# Patient Record
Sex: Female | Born: 1945 | Hispanic: No | Smoking: Former smoker
Health system: Southern US, Community
[De-identification: ages and names within clinical notes are randomized; demographics above are authoritative.]

## PROBLEM LIST (undated history)

## (undated) DIAGNOSIS — I10 Essential (primary) hypertension: Secondary | ICD-10-CM

## (undated) DIAGNOSIS — I2510 Atherosclerotic heart disease of native coronary artery without angina pectoris: Secondary | ICD-10-CM

## (undated) DIAGNOSIS — I509 Heart failure, unspecified: Secondary | ICD-10-CM

## (undated) DIAGNOSIS — I5022 Chronic systolic (congestive) heart failure: Secondary | ICD-10-CM

## (undated) DIAGNOSIS — G4730 Sleep apnea, unspecified: Secondary | ICD-10-CM

## (undated) DIAGNOSIS — I639 Cerebral infarction, unspecified: Secondary | ICD-10-CM

## (undated) DIAGNOSIS — Z95810 Presence of automatic (implantable) cardiac defibrillator: Secondary | ICD-10-CM

## (undated) DIAGNOSIS — N189 Chronic kidney disease, unspecified: Secondary | ICD-10-CM

## (undated) DIAGNOSIS — T7840XA Allergy, unspecified, initial encounter: Secondary | ICD-10-CM

## (undated) DIAGNOSIS — I472 Ventricular tachycardia, unspecified: Secondary | ICD-10-CM

## (undated) DIAGNOSIS — I255 Ischemic cardiomyopathy: Secondary | ICD-10-CM

## (undated) DIAGNOSIS — E785 Hyperlipidemia, unspecified: Secondary | ICD-10-CM

## (undated) DIAGNOSIS — Z8619 Personal history of other infectious and parasitic diseases: Secondary | ICD-10-CM

## (undated) DIAGNOSIS — I4891 Unspecified atrial fibrillation: Secondary | ICD-10-CM

## (undated) DIAGNOSIS — M1990 Unspecified osteoarthritis, unspecified site: Secondary | ICD-10-CM

## (undated) DIAGNOSIS — M47816 Spondylosis without myelopathy or radiculopathy, lumbar region: Secondary | ICD-10-CM

## (undated) HISTORY — DX: Chronic kidney disease, unspecified: N18.9

## (undated) HISTORY — DX: Unspecified osteoarthritis, unspecified site: M19.90

## (undated) HISTORY — DX: Hyperlipidemia, unspecified: E78.5

## (undated) HISTORY — DX: Allergy, unspecified, initial encounter: T78.40XA

## (undated) HISTORY — DX: Personal history of other infectious and parasitic diseases: Z86.19

## (undated) HISTORY — DX: Heart failure, unspecified: I50.9

## (undated) HISTORY — DX: Spondylosis without myelopathy or radiculopathy, lumbar region: M47.816

## (undated) HISTORY — PX: CARDIAC DEFIBRILLATOR PLACEMENT: SHX171

---

## 1978-03-23 HISTORY — PX: OTHER SURGICAL HISTORY: SHX169

## 1983-03-24 HISTORY — PX: CHOLECYSTECTOMY: SHX55

## 1993-03-23 HISTORY — PX: ABDOMINAL HYSTERECTOMY: SHX81

## 1997-07-20 ENCOUNTER — Inpatient Hospital Stay (HOSPITAL_COMMUNITY): Admission: EM | Admit: 1997-07-20 | Discharge: 1997-07-21 | Payer: Self-pay | Admitting: Emergency Medicine

## 1997-10-04 ENCOUNTER — Observation Stay (HOSPITAL_COMMUNITY): Admission: EM | Admit: 1997-10-04 | Discharge: 1997-10-05 | Payer: Self-pay | Admitting: Emergency Medicine

## 1998-03-28 ENCOUNTER — Inpatient Hospital Stay (HOSPITAL_COMMUNITY): Admission: EM | Admit: 1998-03-28 | Discharge: 1998-03-30 | Payer: Self-pay | Admitting: Emergency Medicine

## 1998-03-29 ENCOUNTER — Encounter: Payer: Self-pay | Admitting: Interventional Cardiology

## 1998-06-25 ENCOUNTER — Other Ambulatory Visit: Admission: RE | Admit: 1998-06-25 | Discharge: 1998-06-25 | Payer: Self-pay | Admitting: *Deleted

## 1999-04-07 ENCOUNTER — Encounter: Payer: Self-pay | Admitting: Interventional Cardiology

## 1999-04-07 ENCOUNTER — Inpatient Hospital Stay (HOSPITAL_COMMUNITY): Admission: EM | Admit: 1999-04-07 | Discharge: 1999-04-09 | Payer: Self-pay | Admitting: Emergency Medicine

## 1999-08-11 ENCOUNTER — Emergency Department (HOSPITAL_COMMUNITY): Admission: EM | Admit: 1999-08-11 | Discharge: 1999-08-11 | Payer: Self-pay | Admitting: *Deleted

## 1999-12-03 ENCOUNTER — Emergency Department (HOSPITAL_COMMUNITY): Admission: EM | Admit: 1999-12-03 | Discharge: 1999-12-03 | Payer: Self-pay | Admitting: *Deleted

## 2000-03-05 ENCOUNTER — Emergency Department (HOSPITAL_COMMUNITY): Admission: EM | Admit: 2000-03-05 | Discharge: 2000-03-05 | Payer: Self-pay

## 2000-12-29 ENCOUNTER — Inpatient Hospital Stay (HOSPITAL_COMMUNITY): Admission: EM | Admit: 2000-12-29 | Discharge: 2000-12-31 | Payer: Self-pay | Admitting: Emergency Medicine

## 2000-12-29 ENCOUNTER — Encounter: Payer: Self-pay | Admitting: Emergency Medicine

## 2000-12-31 ENCOUNTER — Encounter: Payer: Self-pay | Admitting: Cardiology

## 2001-03-17 ENCOUNTER — Emergency Department (HOSPITAL_COMMUNITY): Admission: EM | Admit: 2001-03-17 | Discharge: 2001-03-17 | Payer: Self-pay | Admitting: Emergency Medicine

## 2001-03-17 ENCOUNTER — Encounter: Payer: Self-pay | Admitting: Emergency Medicine

## 2001-11-02 ENCOUNTER — Emergency Department (HOSPITAL_COMMUNITY): Admission: EM | Admit: 2001-11-02 | Discharge: 2001-11-02 | Payer: Self-pay | Admitting: Emergency Medicine

## 2002-01-10 ENCOUNTER — Inpatient Hospital Stay (HOSPITAL_COMMUNITY): Admission: EM | Admit: 2002-01-10 | Discharge: 2002-01-14 | Payer: Self-pay | Admitting: Emergency Medicine

## 2002-01-11 ENCOUNTER — Encounter: Payer: Self-pay | Admitting: Interventional Cardiology

## 2002-01-11 ENCOUNTER — Encounter (INDEPENDENT_AMBULATORY_CARE_PROVIDER_SITE_OTHER): Payer: Self-pay | Admitting: Interventional Cardiology

## 2002-01-12 ENCOUNTER — Encounter: Payer: Self-pay | Admitting: Internal Medicine

## 2002-01-12 ENCOUNTER — Encounter: Payer: Self-pay | Admitting: Interventional Cardiology

## 2002-05-01 ENCOUNTER — Encounter: Admission: RE | Admit: 2002-05-01 | Discharge: 2002-05-01 | Payer: Self-pay | Admitting: Internal Medicine

## 2002-05-01 ENCOUNTER — Encounter: Payer: Self-pay | Admitting: Internal Medicine

## 2002-05-02 ENCOUNTER — Encounter: Payer: Self-pay | Admitting: Internal Medicine

## 2002-05-02 ENCOUNTER — Encounter: Admission: RE | Admit: 2002-05-02 | Discharge: 2002-05-02 | Payer: Self-pay | Admitting: Internal Medicine

## 2002-05-16 ENCOUNTER — Ambulatory Visit (HOSPITAL_COMMUNITY): Admission: RE | Admit: 2002-05-16 | Discharge: 2002-05-16 | Payer: Self-pay | Admitting: Internal Medicine

## 2002-05-16 ENCOUNTER — Encounter: Payer: Self-pay | Admitting: Internal Medicine

## 2003-12-09 ENCOUNTER — Emergency Department (HOSPITAL_COMMUNITY): Admission: EM | Admit: 2003-12-09 | Discharge: 2003-12-09 | Payer: Self-pay | Admitting: Emergency Medicine

## 2004-04-02 ENCOUNTER — Ambulatory Visit: Payer: Self-pay | Admitting: Cardiology

## 2004-04-03 ENCOUNTER — Inpatient Hospital Stay (HOSPITAL_COMMUNITY): Admission: EM | Admit: 2004-04-03 | Discharge: 2004-04-09 | Payer: Self-pay | Admitting: Emergency Medicine

## 2004-04-04 ENCOUNTER — Encounter: Payer: Self-pay | Admitting: Cardiology

## 2005-04-01 ENCOUNTER — Inpatient Hospital Stay (HOSPITAL_COMMUNITY): Admission: EM | Admit: 2005-04-01 | Discharge: 2005-04-10 | Payer: Self-pay | Admitting: Emergency Medicine

## 2005-04-02 ENCOUNTER — Encounter (INDEPENDENT_AMBULATORY_CARE_PROVIDER_SITE_OTHER): Payer: Self-pay | Admitting: Cardiology

## 2005-04-18 ENCOUNTER — Inpatient Hospital Stay (HOSPITAL_COMMUNITY): Admission: EM | Admit: 2005-04-18 | Discharge: 2005-04-22 | Payer: Self-pay | Admitting: Emergency Medicine

## 2005-05-29 ENCOUNTER — Inpatient Hospital Stay (HOSPITAL_COMMUNITY): Admission: EM | Admit: 2005-05-29 | Discharge: 2005-06-04 | Payer: Self-pay | Admitting: Emergency Medicine

## 2005-05-29 ENCOUNTER — Ambulatory Visit: Payer: Self-pay | Admitting: Internal Medicine

## 2005-06-11 ENCOUNTER — Inpatient Hospital Stay (HOSPITAL_COMMUNITY): Admission: EM | Admit: 2005-06-11 | Discharge: 2005-06-19 | Payer: Self-pay | Admitting: Emergency Medicine

## 2005-06-11 ENCOUNTER — Ambulatory Visit: Payer: Self-pay | Admitting: Internal Medicine

## 2005-06-20 ENCOUNTER — Emergency Department (HOSPITAL_COMMUNITY): Admission: EM | Admit: 2005-06-20 | Discharge: 2005-06-20 | Payer: Self-pay | Admitting: Emergency Medicine

## 2005-06-29 ENCOUNTER — Emergency Department (HOSPITAL_COMMUNITY): Admission: EM | Admit: 2005-06-29 | Discharge: 2005-06-29 | Payer: Self-pay | Admitting: Emergency Medicine

## 2005-07-03 ENCOUNTER — Emergency Department (HOSPITAL_COMMUNITY): Admission: EM | Admit: 2005-07-03 | Discharge: 2005-07-03 | Payer: Self-pay | Admitting: Emergency Medicine

## 2005-07-08 ENCOUNTER — Encounter: Admission: RE | Admit: 2005-07-08 | Discharge: 2005-07-08 | Payer: Self-pay | Admitting: Cardiology

## 2005-07-11 ENCOUNTER — Inpatient Hospital Stay (HOSPITAL_COMMUNITY): Admission: EM | Admit: 2005-07-11 | Discharge: 2005-07-16 | Payer: Self-pay | Admitting: Emergency Medicine

## 2005-11-20 ENCOUNTER — Ambulatory Visit: Payer: Self-pay | Admitting: Gastroenterology

## 2005-11-24 ENCOUNTER — Ambulatory Visit: Payer: Self-pay | Admitting: Gastroenterology

## 2005-11-27 ENCOUNTER — Ambulatory Visit: Payer: Self-pay | Admitting: Gastroenterology

## 2005-11-30 ENCOUNTER — Ambulatory Visit: Payer: Self-pay | Admitting: *Deleted

## 2005-12-15 ENCOUNTER — Ambulatory Visit: Payer: Self-pay | Admitting: Gastroenterology

## 2005-12-23 ENCOUNTER — Ambulatory Visit: Payer: Self-pay | Admitting: Gastroenterology

## 2005-12-28 ENCOUNTER — Emergency Department (HOSPITAL_COMMUNITY): Admission: EM | Admit: 2005-12-28 | Discharge: 2005-12-28 | Payer: Self-pay | Admitting: Emergency Medicine

## 2006-05-04 ENCOUNTER — Ambulatory Visit: Payer: Self-pay | Admitting: Gastroenterology

## 2007-03-24 LAB — HM COLONOSCOPY

## 2007-03-29 ENCOUNTER — Encounter: Payer: Self-pay | Admitting: Family Medicine

## 2007-03-29 ENCOUNTER — Ambulatory Visit (HOSPITAL_COMMUNITY): Admission: RE | Admit: 2007-03-29 | Discharge: 2007-03-29 | Payer: Self-pay | Admitting: Family Medicine

## 2007-03-29 ENCOUNTER — Ambulatory Visit: Payer: Self-pay | Admitting: Vascular Surgery

## 2007-07-27 ENCOUNTER — Ambulatory Visit (HOSPITAL_COMMUNITY): Admission: RE | Admit: 2007-07-27 | Discharge: 2007-07-27 | Payer: Self-pay | Admitting: Cardiology

## 2008-03-23 DIAGNOSIS — I639 Cerebral infarction, unspecified: Secondary | ICD-10-CM

## 2008-03-23 HISTORY — DX: Cerebral infarction, unspecified: I63.9

## 2009-05-14 ENCOUNTER — Encounter: Admission: RE | Admit: 2009-05-14 | Discharge: 2009-05-14 | Payer: Self-pay | Admitting: Family Medicine

## 2009-07-08 ENCOUNTER — Ambulatory Visit (HOSPITAL_COMMUNITY): Admission: RE | Admit: 2009-07-08 | Discharge: 2009-07-08 | Payer: Self-pay | Admitting: Cardiovascular Disease

## 2010-04-13 ENCOUNTER — Encounter: Payer: Self-pay | Admitting: Cardiovascular Disease

## 2010-06-10 LAB — GLUCOSE, CAPILLARY: Glucose-Capillary: 167 mg/dL — ABNORMAL HIGH (ref 70–99)

## 2010-08-05 NOTE — Cardiovascular Report (Signed)
NAMEGERRY, HEAPHY NO.:  [id number]   MEDICAL RECORD NO.:  [id number]          PATIENT TYPE:  INP   LOCATION:  2899                         FACILITY:  [facility]   PHYSICIAN:  [name], M.D.DATE OF BIRTH:  November 10, 1945   DATE OF PROCEDURE:  07/27/2007  DATE OF DISCHARGE:                            CARDIAC CATHETERIZATION   PROCEDURES PERFORMED:  1. Outpatient cardiac catheterization consisting of selective coronary      angiography by Judkins technique.  2. Retrograde left heart catheterization.  3. Left ventricular angiography.  4. No percutaneous coronary artery intervention performed.   COMPLICATIONS:  None.   ENTRY SITE:  Right femoral.  We first used 5-French diagnostic catheters  then had to upgrade to a 6-French system to allow special curves on the  diagnostic catheters.   PATIENT PROFILE:  The patient is a 65 year old Native American female  who has a nonischemic cardiomyopathy and who has an indwelling  biventricular dual-lead ICD.  The patient has had stents placed to her  proximal LAD, to her mid LAD, and has a long overlapped stent to her RCA  that begins proximally and that ends in the midportion of the vessel.  Today's procedure was performed electively without any complication  resolves.   PRESSURES:  The left ventricular pressure was 165/10, end-diastolic  pressure 14, central aortic pressure 166/73 with a mean of 106.   ANGIOGRAPHIC REPORTS:  The patient has radio-opaque stents present in  the three previous locations mentioned proximal LAD, mid LAD, and  proximal and mid RCA, which is one long overlapping stent.   The LAD shows patency of both stents in her LAD and the LAD itself  appears well compensated with stents in place.   The second diagonal branch of the LAD appears normal.  The first  diagonal branch of the LAD contains a 60% ostial stenosis and somewhat  went on a favorable anatomy for consideration of stenting of this  ostium  because it is bordered by a radio-opaque stent and I believe that trying  to go down with a second stent through the existing stent will be  difficult because of the curvaceous nature of the lesion.   As previously stated the second stent in the LAD is patent.   The right coronary artery has a peculiar takeoff and seven catheters  were tried before we found the catheter that would intubate the vessel  and then only in a suboptimal fashion.  We were able to get enough  opacification to see the distal vessel.  The stent is widely patent and  there is noted to be a 40% stenosis in the RCA just beyond the patent  stent.  The distal RCA consists of a fairly normal posterior descending  branch and two posterolateral branches.   The left ventricle shows LVEF of 20%.  It is dilated.  There is no  mitral regurgitation.   Percutaneous coronary artery intervention was not performed.   FINAL IMPRESSIONS:  1. Patency to left anterior descending  stents, 60% ostial diagonal.  2. Patency of RCA stent, 20% left ventricular ejection fraction of a  dilated left ventricle, historically unchanged from previous      studies.   PLAN:  Despite the patient's abnormalities on the stress test, she is  asymptomatic.  She has an allergy to PLAVIX and is on ticlopidine  instead.  We will continue to follow her medically.  This patient is  asymptomatic, so we will not be aggressive in terms of trying to address  the ostial 60% LAD stenosis or the 40% distal RCA stenosis.  The patient  will be ambulated in the hallway and if appropriate will be discharged  later today and follow up at the offices of [facility] where she is followed by Dr. Elsie Lincoln and Dr. Alanda Amass.           ______________________________  [name], M.D.     WHG/MEDQ  D:  07/27/2007  T:  07/28/2007  Job:  [id number]   cc:   [facility]

## 2010-08-08 NOTE — Discharge Summary (Signed)
NAME:  [name], STANSELL                       ACCOUNT NO.:  [id number]   MEDICAL RECORD NO.:  [id number]                   PATIENT TYPE:  INP   LOCATION:  2902                                 FACILITY:  [facility]   PHYSICIAN:  Neziah Braley DICTATOR                    DATE OF BIRTH:  11-Sep-1945   DATE OF ADMISSION:  01/10/2002  DATE OF DISCHARGE:  01/14/2002                                 DISCHARGE SUMMARY   CONSULTANTS:  1. Doylene Canning. Ladona Ridgel, M.D. [facility].  2. Duke Salvia, M.D. [facility].   PROCEDURES:  1. A 2D echocardiogram revealing moderately decreased left ventricular     function, EF 35% to 45%. Akinesis of the basal to mid inferior posterior     wall. Mild aortic valve regurgitation with estimated aortic valve area by     V-max 2.81 cm2.  Mild mitral regurgitation.  2. On January 11, 2002, a head CT scan revealing no acute findings; left-     sided sphenoid sinusitis.  3. On October 22 , 2003, adenosine Cardiolite  revealing mild hypokinesis of     the inferior wall; moderate fixed defect involving the inferior and     inferolateral walls. A smaller fixed defect involving the apex is noted.     No reversible defects (no ischemia). Ejection fraction 45%. End diastolic     volume 154 mL and systolic volume  82 mL.  4. On January 11, 2002, EPS by Dr. Graciela Husbands revealing inducible ventricular     tachycardia, polymorphic, setting of serum potassium 3.1.  5. On January 13, 2002, EPS by Dr. Sherryl Manges, no inducible ventricular     tachycardia with serum potassium of 4.4.   DISCHARGE DIAGNOSES:  1. Syncope of uncertain cause, suspected related to arrhythmia secondary to     hypokalemia. Initial electrophysiologic study setting of serum potassium     of 3.1 revealed inducible polymorphic ventricular tachycardia setting of     potassium 3.1; repeat electrophysiologic study with corrected potassium     without inducible ventricular tachycardia. Dr. Graciela Husbands feels no further     workup or treatment  is  necessary.  2. History of coronary atherosclerotic heart disease with prior inferior     myocardial infarction and with history of percutaneous transluminal     coronary angioplasty of the right coronary artery, stent to the left     anterior descending artery (diagonal placed in stent JL. ) Stress     Cardiolite this admission negative ischemia; old inferior scar. Question     small scar apex.  3. History of ischemic cardiomyopathy: Echocardiogram revealing ejection     fraction 35% to 45%, Cardiolite revealing ejection fraction 47%. Regional     wall motion abnormality with akinesis of the basal, mid, inferior,     posterior wall by echocardiogram. Lisinopril was added to her regimen at     20 mg per day  with continuation of low dose diuretic and beta blocker.  4. Hypokalemia:  Admission serum potassium 2.9; supplemented, a total of     120 mEq with improvement to 3.8, then back down to 3.1. Again     supplemented total mEq of potassium with serum potassium of 4.4 by     January 13, 2002. She will be discharged on 40 mEq per day with followup     BMET in one week.  5. Diabetes mellitus:  On Glucophage  prior to  admission. This was held     pending diagnostic studies. Serum glucose running high during admission     from 149 to 256; on ADA diet. She will resume Glucophage after discharge.   DISPOSITION:  The patient was discharged to home in stable condition.    DISCHARGE MEDICATIONS:  1. Toprol  XL 50 mg p.o. q.d.  2. Furosemide 20 mg p.o. q.d.  3. Enteric coated aspirin 325 mg q.d.  4. Glucophage 1000 mg p.o. b.i.d.  5. Norvasc 10 mg p.o. q.d.  6. Lisinopril 20 mg q.d., new.  7. K-Dur 20 mEq 2 tablets p.o. q.d., new.   DISCHARGE INSTRUCTIONS:  No heavy lifting or pushing, no driving until  Monday, January 16, 2002.  Diet low fat, low cholesterol, no added salt,  diabetic diet. Wound care, may shower. Special instructions:  The patient is  to call [facility] at [phone] to establish with Dr.  Mayra Neer if her insurance covers this group. She will have BMET  checked on Thursday, January 19, 2002, at our office. Followup with Dr.  Verdis Prime Thursday, January 26, 2002, at 2 o'clock p.m.   HISTORY OF PRESENT ILLNESS:  The patient is a very pleasant 65 year old  female with a history of coronary artery disease, status post inferior MI  approximately 7 years earlier with a history of stenting to the LAD four  years ago. Most recently admitted in December 2001 for chest discomfort.  Known EF approximately 35%. On the day of admission she was in her usual  state of health, until when she was cooking dinner and at the sink, she  suddenly without any warning became dizzy for just a few seconds and passed  out falling backwards and hitting her head. The episode was witnessed by her  son who  reportedly told the patient that she was initially not breathing  and had a very weak pulse. By the time the paramedics arrived, the patient  began to awaken and she felt weak and somewhat diaphoretic which eventually  resolved.   Admission to [facility] revealed only additional PVCs, no sustained  ventricular dysrhythmias. She ruled out for MI by negative serial cardiac  enzymes. Laboratory work was significant for a potassium  of  2.9.   HOSPITAL COURSE:  PROBLEM #1, SYNCOPE:  Dr. Lewayne Bunting of EPS was  consulted. He noted the patient with a history of frequent ventricular  ectopy with EP study back in July 1997 without demonstrable ventricular  tachycardia. She was on amiodarone initially for her nonsustained  symptomatic ventricular tachycardia. This was subsequently discontinued as  the patient was noncompliant with followup in the clinic secondary to cost  constraints and insurance issues. She was, however, maintained on beta  blockers and ACE inhibitors. She does have known ischemic cardiomyopathy with an EF approximately 35%.  Remarkably the patient's serum potassium was  2.9. A 2D echocardiogram revealed an EF ranging from 35% to 45% with  akinesis  of the basal to mid inferior posterior wall. Potassium was  supplemented with a total of 120 mEq with a serum potassium of 3.8 during  the evening of January 11, 2002. On January 12, 2002, her serum potassium  was 3.1. She was taken for EPS by Dr. Graciela Husbands, who did identify polymorphic  inducible ventricular tachycardia. Dr. Graciela Husbands felt this would need to be  resolved prior to consideration of  implantable cardio defibrillator and  assistance. One was if the patient had myocardial ischemia and the other  what impact did the serum potassium of 3.1 have on the inducibility of  ventricular tachycardia. The patient subsequently underwent adenosine  Cardiolite which was indeed negative for evidence of ischemia with old  inferior/lateral scar, EF of 47%. Her potassium was supplemented and  improved to 4.4. She underwent a repeat EPS  with Dr. Graciela Husbands with no inducible ventricular tachycardia. Dr. Graciela Husbands felt no  further workup or therapy was needed. The patient had no further episodes of  significant light headedness, dizziness or syncopal episodes. She had  decreased ventricular ectopy with potassium supplementation.   PROBLEM #2, HYPOKALEMIA:  Admission potassium was 3.9. The patient had been  on Lasix 20 mg q.d. and had not been taking her lisinopril secondary to past  insurance issues, although now she does have drug prescription coverage. The  patient was  supplemented with a total of  120 mEq with improvement of serum  potassium to 3.8. She was restarted on her lisinopril as well as continued  on her Lasix 20 mg q.d.   On January 12, 2002, her morning serum potassium was 3.1. She received  initial 120 mEq throughout that day, and on the morning of January 13, 2002,  her serum potassium was 4.4. It is anticipated she will be discharged on 40  mEq p.o. q.d. with followup  BMET on January 19, 2002, at our clinic.   PROBLEM #3, HISTORY OF CORONARY ATHEROSCLEROTIC HEART DISEASE:  She had a  prior MI  and subsequent PTCA of the RCA back in 1995, stent of the proximal  LAD in January 2001. The patient was without anginal symptoms  prior to  admission essentially since December 2001. She ruled out for MI by negative  serial cardiac enzymes and her EKG was unchanged from baseline. She  underwent an adenosine Cardiolite this admission which was negative for  ischemia with an EF of 47%.   PROBLEM #4, ISCHEMIC CARDIOMYOPATHY: An admission echocardiogram revealing  an EF of 35% to 45% with akinesis of basal to mid inferior posterior wall. A  followup Cardiolite revealed an EF of 47% with old inferior and  inferolateral scar. Question a smaller fixed defect involving the apex, no  ischemia. She will be reinitiated on lisinopril with continuation of her  Toprol  and low dose diuretic at the time of discharge.  PROBLEM #5, TYPE 2 DIABETES MELLITUS.  She was on Glucophage  prior to  admission. This was resumed at the time of discharge.   PROBLEM #6, HISTORY OF BICUSPID AORTIC VALVE WITH AORTIC REGURGITATION.   PROBLEM #7, HISTORY OF IRRITABLE BOWEL SYNDROME.  She had a normal EGD and  colonoscopy by Dr. Danise Edge in 1995. The patient did have a complaint  of episodic left upper quadrant discomfort which has been chronic and  intermittent in nature. We will have her followup with her primary care  Latish Toutant regarding this issue, with whom she should establish after  discharge pending insurance coverage. She has had  normal bowel movements.   PROBLEM #8, HEAD INJURY:  The patient had struck the back of her head with  her fall in the setting of syncope. She had a slight headache at the base of  the posterior skull. A head CT scan was essentially benign.   LABORATORY DATA:  Admission WBC 7.4, hemoglobin 12.7, hematocrit 37.4,  platelets 249. Protime 13, INR 0.9, PTT  29. Sodium 141, potassium 2.9,  potassium 4.4 on January 13, 2002, chloride 108, CO2 24, glucose 195 as high  as 256, 158 on January 13, 2002, BUN 15, creatinine 0.6.  LFTs all within  normal range, although indirect bilirubin slightly elevated at 1.2, total  bilirubin slightly elevated at 1.3, albumin slightly decreased at 3.4.  Calcium within normal range. Serial cardiac enzymes were negative x 3 sets.  TSH within normal range at 0.832. A urinalysis was essentially normal,  although it did show trace blood, spilling of glucose and spilling of  protein.   PAST MEDICAL HISTORY:  1. Coronary atherosclerotic heart disease.     A. In July 2001, Cardiolite negative for ischemia, old inferior MI.     B. In January 2001, stent proximal LAD, EF 40%. Diagonal was placed in        stent JL. Prior stent that had been placed mid LAD was patent. Mild        to moderate circumflex disease (50% proximal, and 60% to 70% distal        RCA, 50% mid).  Global hypokinesis, EF 35%.     C. January 2000, stent mid LAD.     D. June 1998, diagnostic cardiac catheterization.     E. In 1997, chest pain with diagnostic catheterization revealing patent        RCA stent.     F. In May 1995, inferior MI with subsequent PTCA of the RCA.  2. Ischemic dilated cardiomyopathy with EF approximately 35% and heart     catheterization January 2001.  3. Hypertension.  4. History of high grade ventricular ectopy exacerbated with hypokalemia.     Initiated on amiodarone in July 1997, which was subsequently discontinued     secondary to the patient's inability to pay for the high cost of drug and     for the lack of followup with monitoring of TSH, LFTs and PFTs. She has     had EPS consultation by Dr. Graciela Husbands with the study revealing noninducible     ventricular tachycardia.  5. Diabetes mellitus diagnosed in 2000, type 2.  6. Bicuspid aortic valve with aortic regurgitation.  7. Irritable bowel syndrome with normal EGD and  colonoscopy by Dr. Danise Edge in 1995.  8. Dyslipidemia.  9. Allergic rhinitis.   PAST SURGICAL HISTORY:  1. Cholecystectomy.  2. Tubal ligation.  3. Hysterectomy without BSO.   Total time preparing this discharge greater than 40 minutes, including  dictating this discharge summary, filling out and reviewing  discharge  instructions with the patient, arranging followup office visits and calling  in prescriptions.                                               Lerin Jech DICTATOR    DD/MEDQ  D:  01/13/2002  T:  01/15/2002  Job:  [id number]   cc:   Doylene Canning. Ladona Ridgel, M.D. [facility]   Viviann Spare  C. Graciela Husbands, M.D. [facility]

## 2010-08-08 NOTE — H&P (Signed)
[place]  Patient:    [name], [name] Visit Number: [id number] MRN: [id number]          Service Type: MED Location: [id number] Attending Physician:  Osvaldo Human Dictated by:   Clovis Pu Patty Sermons, M.D. Admit Date:  12/29/2000   CC:         Darci Needle, M.D.  Wayne C. Dorna Bloom, M.D.   History and Physical  CHIEF COMPLAINT:  Chest pain.  HISTORY OF PRESENT ILLNESS:  This is a 65 year old woman with known coronary artery disease admitted with crescendo angina.  She has a past history of multiple admissions since her initial myocardial infarction in 1995.  Her last cardiac catheterization was April 08, 1999, Dr. Verdis Prime, at which time she had a stent placed to the proximal LAD.  Prior mid stent was widely patent.  She has a history of having had an initial myocardial infarction in 1995.  She has had poor LV function with an ejection fraction of 35%.  She has had a problem with ventricular arrhythmias in the past and has seen Dr. Graciela Husbands and was found to have noninducible ventricular tachycardia.  She has had history of known tricuspid aortic valve with mild aortic insufficiency.   She has a history of dyslipidemia but has not been able to tolerate any "statin" drugs.  She has a long history of high blood pressure dating back 30 years.  She has a history of diabetes presently treated with diet alone.  PRESENT MEDICATIONS: 1. Accupril 40 mg daily. 2. Aspirin 325 mg daily. 3. Imdur 60 mg daily. 4. Norvasc 5 mg daily. 5. Lasix 40 mg daily. 6. Toprol XL 50 mg 1 daily. 7. Nitroglycerin p.r.n.  She is no longer taking Glucophage or Neurontin.  The patient has noted some vague chest discomfort for the past several days, but today the pain was worse; it was substernal with a steady pressure-like discomfort.  It did not radiate.  It was associated with nausea but no diaphoresis or vomiting.  The patient took a nitroglycerin  spray with improvement.  SOCIAL HISTORY:  The patient has been unemployed since January 2002.  She is divorced.  She has three children.  She does not smoke, and she rarely drinks any alcohol.  REVIEW OF SYSTEMS:  GI: No ulcer symptoms.  GU: No symptoms.  She has had nocturia once or twice a night.  She has had a past history of diabetic neuropathy, but that has improved, and she no longer is requiring Neurontin. She denies any cough or sputum production.  The remainder of the Review of Systems is negative in detail.  ALLERGIES:  DARVON and PLAVIX.  PAST SURGICAL HISTORY:  Cholecystectomy and hysterectomy.  PHYSICAL EXAMINATION:  VITAL SIGNS:  Blood pressure 145/83, pulse 60 and regular, respirations normal.  HEAD AND NECK:  Pupils are equal, round and reactive to light and accommodation.  Mouth and pharynx normal.  Tongue normal.  Jugular venous pressure normal.  Thyroid normal.  Carotids normal.  There is no lymphadenopathy.  CHEST:  Clear.  HEART:  No gallop.  There is grade 2/6 systolic murmur.  There is no rub.  ABDOMEN:  Soft without evidence of organomegaly or masses.  EXTREMITIES:  Good peripheral pulses.  No phlebitis or edema.  LABORATORY DATA:  Electrocardiogram shows normal sinus rhythm with new T wave inversions V1 through V4.  Chest x-ray is pending.  Labs are pending.  IMPRESSION: 1. Unstable crescendo angina.  2. History of ischemic cardiomyopathy. 3. History of hypertension. 4. Diabetes mellitus. 5. Probable depression with crying spells.  DISPOSITION:  Admit to Dr. Verdis Prime, telemetry.  Will get serial enzymes and EKG.  Will treat with IV nitroglycerin and IV heparin.  She will probably require cardiac catheterization.  We will hold her breakfast until seen by Dr. Lonn Georgia service.  Will consider possibly adding an antidepressant at some point. Dictated by:   Clovis Pu Patty Sermons, M.D. Attending Physician:  Osvaldo Human DD:  12/29/00 TD:   12/29/00 Job: [id number]

## 2010-08-08 NOTE — H&P (Signed)
NAMESUZZANNE, BRUNKHORST             ACCOUNT NO.:  [id number]   MEDICAL RECORD NO.:  [id number]          PATIENT TYPE:  INP   LOCATION:  1828                         FACILITY:  [facility]   PHYSICIAN:  Lonia Blood, M.D.       DATE OF BIRTH:  Jul 17, 1945   DATE OF ADMISSION:  04/01/2005  DATE OF DISCHARGE:                                HISTORY & PHYSICAL   PRIMARY CARE PHYSICIAN:  Unassigned.   CHIEF COMPLAINT:  Syncope.   HISTORY OF PRESENT ILLNESS:  Ms. Keetch is a 65 year old Native American  woman with history of diabetes and ischemic cardiomyopathy who was watching  T.V. on the night of admission when all of a sudden she passed out without  any warning. Her friend, who was in the room, called 9-1-1 and when EMS  arrived the patient was found to be in atrial fibrillation with rapid  ventricular response. She was transferred to the emergency room at [facility]. Ms. Quintin denies any chest pain but she reports some  dyspnea. Ms. Tison has an extensive cardiac history. About a year ago,  she had three stents placed in the RCA and after that she took some heart  medications for a while but due to the insurance problems she is now taking  only some aspirin and a blood pressure pill of an unknown name. She has not  taken any Plavix or ticlopidine right now.   MEDICATIONS:  1.  Aspirin 81 milligrams.  2.  Lisinopril unknown dose.  3.  Diabetic pill that she does not know the name of.  4.  She also probably is taking some furosemide again at an unknown dose.   ALLERGIES:  DARVON and listed PLAVIX in E-chart but the patient is unaware  of being allergic to PLAVIX.   PAST MEDICAL HISTORY:  1.  Diabetes.  2.  Hypertension.  3.  Coronary artery disease with stent in the LAD, three stents in the RCA      in January 2006 by Dr. Elsie Lincoln with [facility].  4.  History of nonsustained ventricular tachycardia.  5.  Atrial fibrillation with rapid ventricular response.  6.  Depression with suicide attempt using Tylenol in January 2006.  7.  Ischemic cardiomyopathy with an ejection fraction of 25-30% in January      2006.   FAMILY HISTORY:  Positive and extensive for coronary artery disease and  diabetes.   SOCIAL HISTORY:  Ms. Proffit is a former tobacco user. Quit about 10 years  ago. She works as a Conservation officer, nature at [employer]. She does not  drink alcohol. She is divorced. She has three children and lives with a  friend.   REVIEW OF SYSTEMS:  Positive for some generalized weakness, some shortness  of breath with exertion, lower extremity swelling and negative for chest  pain and palpitations, abdominal pains, nausea, vomiting, and diarrhea. The  patient also reports some episodes of hematochezia at times but she has a  negative colonoscopy.   PHYSICAL EXAMINATION:  Temperature 98.2, pulse 106, respirations 20, blood  pressure 173/138. Saturation 92% on room air.  Ms. Kiel currently appears  in no acute distress. She is alert, oriented to place, person and time and  sitting calmly on the stretcher. Her head is normocephalic and atraumatic.  Eyes have pupils are equal, round, and reactive to light and accommodation.  Extraocular movements intact. Sclerae are anicteric. Conjunctivae are pink.  Mouth is without ulcerations. Throat is clear. Neck with positive JVD. No  carotid bruits. Lungs shows bilateral basilar crackles. No wheezes and no  rhonchi. Heart is irregularly irregular heart rate without murmurs, gallops,  or rubs. Abdomen is soft, nontender, and nondistended. Bowel sounds present.  No hepatosplenomegaly. Lower extremities have +2 bilateral edema. Skin is  warm and dry without any rashes. Neurological exam shows cranial nerves III  through XII are intact. Strength is 5/5 in all four extremities. Sensation  is intact. Psychiatric shows Ms. Thune does not appear to have a  depressed mood. She has intact insight, judgment and  memory. Lymphatics  history shows she has no palpable lymphadenopathy.   LABORATORY DATA:  Sodium 138, potassium 3.6, chloride 110, bicarbonate 20,  BUN 26, creatinine 1.6, glucose 268. Myoglobin 80. CK-MB 1.2, troponin I  0.06. White blood cell count 10,000, hemoglobin 14, platelets 240,000. PT  13. EKG shows atrial fibrillation with rapid ventricular response with a  very poor R-wave progression in the anterior leads. Also occasional  ventricular ectopic beats.   ASSESSMENT:  1.  Syncopal events:  Given the patient's history, this is very worrisome      for episodes of ventricular tachycardia. I will admit Ms. Lonsway to a      monitored bed and follow closely her rhythm. We will also obtain a 2-D      echocardiogram and reassess the ejection fraction. If she persists to      have a very low ejection fraction 35%, we will pursue inpatient      implantable cardioverter defibrillator placement.  2.  Atrial fibrillation with rapid ventricular response:  This is a      recurrent problem for Ms. Levandoski. We will continue Cardizem      intravenously for rate control and follow rate. We will have to consider      long-term anticoagulation with Coumadin.  3.  Coronary artery disease:  Ms. Desmith does not have any chest pain. We      will monitor her on telemetry bed, check three sets of cardiac enzymes.      Start heparin and continue baby aspirin for now.  4.  Ischemic cardiomyopathy with a low ejection fraction:  Asymptomatic      currently. We will start diuresing Ms. Mendiola, check a BNP and a chest      x-ray to further assess her status.  5.  Depression:  Currently, Ms. Avera is without any treatment and she      seems to be euthymic. We will follow her closely.  6.  Deep vein thrombosis prophylaxis:  We will go heparin.  7.  Gastrointestinal prophylaxis with Protonix.      Lonia Blood, M.D.  Electronically Signed    SL/MEDQ  D:  04/01/2005  T:  04/02/2005  Job:   [id number]

## 2010-08-08 NOTE — Assessment & Plan Note (Signed)
[facility]                           GASTROENTEROLOGY OFFICE NOTE   [name], [name]                    MRN:          [id number]  DATE:11/27/2005                            DOB:          [birth year]    PRIMARY CARE PHYSICIAN:  Dr. Rudi Heap.   GASTROINTESTINAL PROBLEM LIST:  1. Gastroparesis, documented by slow gastric emptying on March 2007      gastric emptying scan.  Likely from longstanding diabetes.      Esophagogastroduodenoscopy September 2007 essentially normal.  2. Chronic left lower quadrant pains.  Unclear etiology, possibly related      to her longstanding gastroparesis.  Abdominal ultrasound March 2007      showed nonspecific hepatomegaly, stent in her right renal system,      otherwise essentially normal.   INTERVAL HISTORY:  I last saw [name] at the time of her endoscopy last week.  I suspect that much of her symptoms were related to delayed gastric  emptying, that has been well documented on the gastric emptying scan.  This  is probably from underlying diabetes.  She is taking 5 mg of Reglan once a  day since her hospital visit, and I increased this to 10 mg a day, and she  had a lot of neurologic symptoms following that, including anxiety,  inability to sleep, jitteriness.  She will not restart Reglan.  I put her on  4 mg of Zofran to be taken once daily, which she says has helped a little  bit but she is still nauseous and vomits once a day or once every other day.  She also has a lot of left lower quadrant pain that is yet to be completely  characterized.  I have attributed this to gastroparesis, but I am not sure  that that is all this is.  Lastly, she mentioned today that she had a bloody  bowel movement yesterday.  She has had colonoscopy in the very distant past,  10 to 15 years ago, and she was told it was essentially normal.   CURRENT MEDICATIONS:  1. Coumadin.  2. Fish oil.  3. Betapace.  4. Enalapril.  5.  Klor-Con.  6. Lasix.  7. Digoxin.  8. Aldactone.  9. Glucotrol.  10.Metoprolol.  11.Aspirin.  12.Ticlid.  13.Insulin.  14.Januvia.  15.Crestor.  16.Zofran 4 mg once daily.   PHYSICAL EXAMINATION:  VITAL SIGNS:  Weight 151 pounds, blood pressure  118/72, pulse 60.  CONSTITUTIONAL:  Generally well-appearing.  NEUROLOGIC:  Alert and oriented x3.  LUNGS:  Clear to auscultation bilaterally.  HEART:  Regular rate and rhythm.  ABDOMEN:  Soft, nontender, nondistended, normal bowel sounds.   ASSESSMENT AND PLAN:  A 65 year old woman with diabetic gastroparesis, left  lower quadrant pains.   First, her gastroparesis is likely related to her diabetes.  She checks her  blood sugars 2 to 3 times a day, and tells me they are generally in the  upper 100s to low 200s.  She has recently set herself up with a new primary  care physician, who has added a new diabetic medicine to her regimen.  I  think it would be very hard to control her gastroparesis symptoms unless her  diabetes is under very good control.  She sounds like she is intolerant to  Reglan, Zofran has helped marginally.  She will increase the Zofran to 8 mg  daily.  I have also recommended that she get back in touch with her primary  care physician about attempting to get a little tighter control on her  diabetes.   Her left lower quadrant abdominal pain may be from her gastroparesis, but  could also be diverticular disease.  I am going to arrange for her to have a  CT scan of her abdomen and pelvis today.  If that is negative, since she has  had some blood in her stool, she will have a colonoscopy shortly thereafter.                                   [name] Fee, MD   DPJ/MedQ  DD:  11/27/2005  DT:  11/28/2005  Job #:  [id number]   cc:   [name], M.D.

## 2010-08-08 NOTE — Assessment & Plan Note (Signed)
[facility]                         GASTROENTEROLOGY OFFICE NOTE   [name], [name]                    MRN:          [id number]  DATE:05/04/2006                            DOB:          May 21, 1945    GI PROBLEM LIST:  1. Gastroparesis, documented by slow gastric emptying on March 2007      gastric emptying scan.  Likely from longstanding diabetes.      Esophagogastroduodenoscopy September 2007 essentially normal.  2. Chronic left lower quadrant pains.  Unclear etiology, possibly      related to her longstanding gastroparesis.  Abdominal ultrasound      March 2007 showed nonspecific hepatomegaly, stent in her right      renal system, otherwise essentially normal.  CT scan with IV and      oral contrast September 2007 was essentially normal except for some      mild constipation. Colonoscopy October 2007 normal, next      colonoscopy for CRC screening October 2017.   INTERVAL HISTORY:  I last saw [name] there months ago.  Her nausea, for  the most part has improved.  This may be from better and tighter control  of her diabetes with a change in her insulin regimen.  She has, however,  noticed the left lower quadrant pains have become more bothersome again.  This has been worked up with CT scans, ultrasound and a colonoscopy, all  summarized above.   Laboratory tests two months ago showed a normal CBC and a normal  complete metabolic profile except for a blood glucose level.  Her  hemoglobin A1c was 9.6.  She describes this pain as a discomfort, a  bloated feeling.  It is improved when she moves her bowels, although  that is only a temporary relief.  She does feel like she is constipated  sometimes and takes Peri-Colace for that on a p.r.n. basis.   CURRENT MEDICATIONS:  1. Coumadin.  2. Fish oil.  3. Betapace.  4. Enalapril.  5. Klor-Con.  6. Lasix.  7. Lanoxin.  8. Aldactone.  9. Glucotrol.  10.Metoprolol.  11.Aspirin.  12.Ticlid.  13.Januvia.  14.Crestor.   PHYSICAL EXAMINATION:  VITAL SIGNS:  Weight 158 pounds, which is up 6  pounds since her last visit, blood pressure 142/80, pulse 80.  GENERAL:  Well-appearing.  LUNGS:  Clear to auscultation bilaterally.  HEART:  A regular rate and rhythm.  ABDOMEN:  Soft, nontender, non-distended.  Normal bowel sounds.   ASSESSMENT/PLAN:  A 65 year old woman with diabetic gastroparesis,  chronic left lower quadrant.  I suspect these are functional, related to  mild constipation.  She has had normal recent laboratory testing, normal  CT scan in September 2007, and a normal colonoscopy.  I suspect her left-  sided discomforts are functional, probably related to constipation.  She  will stop the Peri-Colace for now and will begin MiraLax, starting on a  one-scoop-a-day basis.  She will titrate this up or down as needed.  Hopefully if she gets her bowels moving very easily on a daily basis,  that will ease her left lower quadrant discomforts.  Some  of her  discomforts may be from confirmed diabetic gastroparesis.  She has been  intolerant of medicines, as summarized above.  Her nausea is not very  bothersome right now, so I think there is no reason to work that up any  further for now.   She will return to see me in six weeks, and sooner if needed.     Rachael Fee, MD  Electronically Signed    DPJ/MedQ  DD: 05/04/2006  DT: 05/04/2006  Job #: [id number]   cc:   [name], M.D.

## 2010-08-08 NOTE — Assessment & Plan Note (Signed)
[facility]ALTHC[facility]                           GASTROENTEROLOGY OFFICE NOTE   IRELYND, ZUMSTEIN                    MRN:          [id number]  DATE:11/20/2005                            DOB:          1945-07-19    REASON FOR REFERRAL:  Dr. Christell Constant asked me to evaluate Ms. Ciampi in  consultation regarding abdominal pain, nausea, vomiting.   HISTORY OF PRESENT ILLNESS:  Ms. Gamboa is a very pleasant 65 year old  woman with long-standing diabetes, CHF who has had left sided abdominal pain  and cramping for the past 5-6 months.  She describes the pain as a cramping.  It is predominantly on the left side, upper and lower quadrants.  It tends  to be there throughout most of the day.  Eating does seem to worsen it.  She  does have nausea most of every day and she vomits sometimes day old food 2-3  times a week.   She has had some workup including a gastric-emptying scan done, March 2007,  showing delayed gastric emptying with 82% of the tracer remaining in her  stomach at 120 minutes.  She has also had an abdominal ultrasound that  showed mild nonspecific hepatomegaly, status post cholecystectomy and normal  bile ducts.  Recent lab testing show a normal CBC, normal bilirubin, normal  transaminases, normal alk phos.   REVIEW OF SYSTEMS:  Essentially negative and is available on nursing intake  sheet.   PAST MEDICAL HISTORY:  1. CHF.  2. Hypertension.  3. Coronary artery disease with acute MI 10-15 years ago.  4. Arrhythmias.  5. Diabetes.  6. Elevated cholesterol.  7. Status post cholecystectomy, 20 years ago.  8. Hysterectomy, 20 years ago.  9. Tubal ligation, 26 years ago.   CURRENT MEDICINES:  Coumadin, fish oil, Betapace, enalapril, Klor-Con,  furosemide, Lanoxin, aldactone, Glucotrol, metoprolol, Cytotec 100 mg twice  daily (she has been taking this since early March 2007), aspirin, Ticlid,  Humulin, Januvia, and Crestor.   ALLERGIES:  1.  DARVON.  2. PLAVIX.   SOCIAL HISTORY:  Divorced with 3 children.  Works as a Conservation officer, nature at [employer], nonsmoker, nondrinker.   FAMILY HISTORY:  Mother with diabetes.  Mother and father with heart  disease.  No colon cancer, colon polyps in family.   Had colonoscopy 12 years ago at [facility].   PHYSICAL EXAMINATION:  VITAL SIGNS:  Height 5 foot 3 inches, 150 pounds,  blood pressure 134/64, pulse 76.  CONSTITUTIONAL:  Generally well-appearing.  NEUROLOGIC:  Alert and oriented x3.  EYES:  Extraocular movements intact.  MOUTH:  The oropharynx moist.  No lesions.  NECK:  Supple.  No lymphadenopathy.  CARDIOVASCULAR:  Heart regular rate and rhythm.  LUNGS:  Clear to auscultation bilaterally.  ABDOMEN:  Soft, mild to moderately tender left lower quadrant, left upper  quadrant, nondistended.  Normal bowel sounds.  EXTREMITIES:  No lower extremity edema.  SKIN:  No rash or lesions __________  .   ASSESSMENT:  A 65 year old woman with left sided abdominal discomfort,  nausea, vomiting, documented gastroparesis.   PLAN:  First, her gastroparesis is  well documented with a delayed gastric  emptying scan.  Likely this is from her long-standing diabetes.  It is  probably responsible for her chronic nausea and intermittent vomiting.  I  recommend that she begin taking Reglan.  I will start with a low dose to see  if that helps her first 5 mg q.a.c. and h.s. and we will increase if she  needs.  Secondly, her abdominal pain may be related simply to her delayed gastric  emptying but I think we should proceed with endoscopy as soon as convenient.  She may have peptic ulcer disease.  She has been on Cytotec for several  months and this is known to cause nausea, vomiting, abdominal pain as a very  common side effect, so I am having her stop that completely for now.  We  will proceed with further workup following her endoscopy.                                   Rachael Fee,  MD   DPJ/MedQ  DD:  11/20/2005  DT:  11/20/2005  Job #:  [id number]   cc:   Ernestina Penna, MD

## 2010-08-08 NOTE — Discharge Summary (Signed)
[name], [name]             ACCOUNT NO.:  [id number]   MEDICAL RECORD NO.:  [id number]          PATIENT TYPE:  INP   LOCATION:  3729                         FACILITY:  [facility]   PHYSICIAN:  Richard A. Alanda Amass, M.D.DATE OF BIRTH:  03-Feb-1946   DATE OF ADMISSION:  07/11/2005  DATE OF DISCHARGE:  07/16/2005                                 DISCHARGE SUMMARY   DISCHARGE DIAGNOSES:  1.  Left subclavian deep vein thrombosis.  2.  Recent ventricular tachycardia with history of automatic implantable      cardioverter-defibrillator.  3.  Cardiomyopathy.  4.  Paroxysmal atrial fibrillation, sick sinus syndrome.  5.  Peripheral vascular disease with left renal artery stent.  6.  Coronary artery disease with history of drug-eluting stent to the right      coronary artery and left anterior descending artery.  7.  Diabetes mellitus.  8.  Gastroparesis.  9.  Plavix allergy.   DISCHARGE CONDITION:  Improved.   PROCEDURES:  None.   DISCHARGE MEDICATIONS:  1.  Coumadin 5 mg April 26, 2.5 on April 27, 5 mg April 28, 2.5 mg April 29,      and 5 mg April 30.  Recheck pro times on Monday, go early in the      morning.  2.  Aldactone 25 mg daily.  3.  Vasotec 2.5 mg daily.  4.  Lasix 40 mg daily.  5.  Lanoxin 0.125 mg daily.  6.  Ticlid 250 mg one twice a day.  7.  Glipizide 5 mg twice a day.  8.  Zocor 40 mg every evening.  9.  Aspirin 81 mg daily.  10. Novolin, Humulin, NPH 15 units twice a day.  11. Lopressor 50 mg twice a day.  12. Prilosec 20 mg daily.  13. Sotalol 120 mg twice a day.  14. Tramadol as needed.  15. Klor-Con as before.  16. Xanax 0.25 mg as needed every six hours.   DISCHARGE INSTRUCTIONS:  1.  Low fat, low salt, diabetic diet.  2.  Increase activity slowly.  3.  Wound care not applicable.   Follow with Dr. Elsie Lincoln, Jul 29, 2005 at 2:15 p.m.   HISTORY OF PRESENT ILLNESS:  A 65 year old female with a history of  cardiomyopathy and ICD implantation and  ventricular tachycardia presented to  the emergency room on July 11, 2005, with left upper quadrant abdominal  pain, left shoulder discomfort, left upper chest discomfort , and left sided  neck swelling and pain.  She had been seen in the office 2-3 days previously  and that as her symptoms worsened, she had not called the office.  She kept  thinking it would get better.  In the ER, a CT of the chest was done.  She  had no PE but left subclavian venous thrombus.  Her INR unfortunately was  not therapeutic, so she was admitted for heparin Coumadin crossover.   PAST MEDICAL HISTORY:  1.  Coronary disease with stent to the RCA and LAD within the last 2-3      months.  2.  Paroxysmal AFib, sick sinus syndrome.  3.  Ventricular tachycardia with ICD placement.  4.  Peripheral vascular disease with a stent to the left renal artery.  5.  Hypertension.  6.  Hyperlipidemia.   FAMILY HISTORY:  See H&P.   SOCIAL HISTORY:  See H&P.   REVIEW OF SYSTEMS:  See H&P.   PHYSICAL EXAMINATION AT DISCHARGE:  VITAL SIGNS:  Blood pressure 124/75,  pulse 60, respirations 20, temp 97.9, oxygen saturation room air 98%.   LABORATORY:  On admission, hemoglobin 12.2, hematocrit 35.2, WBC 5.8,  platelets 256, neutrophils 76, lymphs 18, mono's 7, eos 1, basos 0.  These  remained stable.  Pro time 17.1 on admission.  INR of 1.4.  She was started  on heparin and when her INR became therapeutic at 2.5 with a pro time of  27.3, she was discharged home.  Chemistries:  Sodium 141, potassium 4.4,  chloride 108, CO2 27, glucose 137, BUN 18, creatinine 0.9, calcium 9.3.  Total protein 6.4, albumin 3.6, AST 17, ALT 28, alkaline phosphatase 74,  total bili 0.5.  Amylase 75, lipase 33 and these remained stable.   CT of the chest:  Occluded left subclavian vein with collateral vessels  filling superior vena cava.  Also, no PE, mild cardiomegaly, soft tissue  swelling at the injection site in the patient's proximal left  forearm.   Chest x-ray, on July 11, 2005, AICD appears the same.  Lungs clear.  No  effusions.  Soft tissues unremarkable.  Abdominal film, July 11, 2005, no  acute abnormality.  She has had a history of cholecystectomy.   EKGs:  AV pacing with no significant change from previous tracing.   HOSPITAL COURSE:  [name] was admitted on July 11, 2005, after being  seen for complaints of left neck pain, left shoulder pain, also some  abdominal pain.  Her INR was sub-therapeutic and she was found to have a  deep vein thrombosis to the subclavian vein.  She was put on IV heparin.  She had no PE on CT and was admitted for heparin Coumadin crossover.  Her  abdominal pain cleared during the hospital stay.  She was improving and once  her  INR was therapeutic, she was ready for discharge home.  She was seen by Dr.  Elsie Lincoln, July 16, 2005, and discharged.   She will follow up as an outpatient.      [name]. [place], N.P.      Richard A. Alanda Amass, M.D.  Electronically Signed    LRI/MEDQ  D:  08/21/2005  T:  08/21/2005  Job:  [id number]   cc:   [name] A. Alanda Amass, M.D.  Fax: [phone]   [name], M.D.  Fax: [phone]

## 2010-08-08 NOTE — Discharge Summary (Signed)
[name], [name]             ACCOUNT NO.:  [id number]   MEDICAL RECORD NO.:  [id number]          PATIENT TYPE:  INP   LOCATION:  6524                         FACILITY:  [facility]   PHYSICIAN:  [name], MDDATE OF BIRTH:  February 22, 1946   DATE OF ADMISSION:  04/02/2004  DATE OF DISCHARGE:  04/09/2004                                 DISCHARGE SUMMARY   DIAGNOSES:  1.  Status post suicide attempt with overdose of Tylenol.  2.  Diabetes.  3.  Hypertension.  4.  Bradycardia.  5.  Atrial fibrillation with rapid ventricular response.  6.  Coronary artery disease with history of myocardial infarction and stent      placement.  7.  Ischemic cardiomyopathy.  8.  Status post cardiac catheterization and stenting of the proximal, mid      and distal right coronary artery disease during this hospitalization.  9.  Nonsustained ventricular tachycardia.   DISCHARGE MEDICATIONS:  1.  Aspirin 81 mg a day.  2.  Ticlid to 150 mg twice a day for six months.  3.  Nitroglycerin sublingually as needed.  4.  Coreg 12.5 mg p.o. b.i.d.  5.  Lisinopril 5 mg a day.  6.  Crestor 10 mg a day.  7.  Amaryl 2 mg a day.  8.  Celexa 10 mg a day.   ACTIVITY:  No heavy lifting for five days. No driving for one day.   He to follow up with Dr. Elsie [name] on May 02, 2004, at 9:45.  She is to  have CBC drawn in two weeks and every month while on Ticlid. Follow up with  Dr. Severiano [name] in three months to evaluate for need for EPS/AICD.  Follow up with  psychiatry at behavioral health.   CONSULTATIONS:  1.  Dr. Jeanie [name].  2.  Dr. Elsie [name].   PROCEDURES:  See above.   CONDITION:  Stable.   DIET:  Diabetic, low salt, low cholesterol.   PERTINENT LABORATORY DATA:  Urine drug screen was negative.  Acetaminophen  level on April 02, 2004, at 2200 was greater than 300, on April 03, 2004, at 4:00 a.m. was 30, on April 03, 2004, at 5:00 a.m. was 20, at 8:00  a.m. was 12 and at 10:00 a.m. was 10. Blood  alcohol less than 5.  Salicylate  level negligible.  Initial acetaminophen level 111 at 1800 was 280. TSH  1.271. LDL 132, HDL 45, triglycerides 130, total cholesterol 203. Cardiac  enzymes negative, serially.  Complete metabolic panel on admission was  significant for a glucose of 236, creatinine of 1.5, otherwise unremarkable.  Her creatinine normalized to 0.9.  Initial coagulation panel was normal.  CBC on admission was fairly unremarkable.   SPECIAL STUDIES AND RADIOLOGY:  Chest x-ray negative.   EKG on admission showed normal sinus rhythm with occasional PVCs, left  ventricular hypertrophy age indeterminate inferior infarct, T-wave  abnormality consider low anterior ischemia.  On April 04, 2004,  it which  showed atrial fibrillation with a rapid ventricular response of a rate of  130, otherwise unchanged.  On April 07, 2004, it showed sinus  bradycardia  with marked sinus arrhythmia with the resolved atrial fibrillation.  Transthoracic echocardiogram showed left ventricle ejection fraction and 25-  35% with severe diffuse left ventricular hypokinesia, moderately increased  left ventricular wall thickness, abnormal left ventricular relaxation, mild  aortic valvular regurgitation.  Cardiac catheterization done by Dr. Elsie [name]  on April 07, 2004, showed [name] novo  lesion of the right coronary artery 95%  proximal, 75% mid,  75% distal successfully stented,  patent LAD artery  stent, ischemic RAO cardiomyopathy with ejection fraction of now 20-30%.   HISTORY AND HOSPITAL COURSE:  [name] is a 65 year old Hispanic female  who was experiencing discord in her relationship.  She overdosed on generic  pain medications.  Initially it was thought to be aspirin but according to  levels was Tylenol. She was admitted to the hospital, started on Mucomyst  and received 17 doses.  Psychiatry was consulted and followed along.  Initially, Dr. Jeanie [name] had recommended voluntary admission and   psychotropics were deferred, however, the patient did not want to be  admitted and Dr. Jeanie [name] felt that she was, by the time of discharge, no  longer a risk to herself and she could safely be discharged to home.   However, during her hospitalization she, while on telemetry, had some  bradycardia.  Her Toprol had been held. She subsequently developed atrial  fibrillation with rapid ventricular response rate. She ruled out for  myocardial infarction and had a normal TSH. Echocardiogram showed much lower  ejection fraction as compared to previous and given this, cardiology was  consulted.  They recommended cardiac catheterization and stented the right  coronary as above. Given her low ejection fraction and nonsustained VT  ventricular tachycardia, she will also need follow-up in three months to see  whether EPS and defibrillator will be necessary.   The patient reportedly had a Plavix allergy and cardiology got a pharmacy  consult who recommended Ticlid and monitoring of CBCs.  Her other medical  problems remained relatively stable during her hospitalization.       CLS/MEDQ  D:  08/19/2004  T:  08/19/2004  Job:  [id number]

## 2010-08-08 NOTE — Consult Note (Signed)
[name], [name]             ACCOUNT NO.:  [id number]   MEDICAL RECORD NO.:  [id number]          PATIENT TYPE:  INP   LOCATION:  6524                         FACILITY:  [facility]   PHYSICIAN:  Mark E. Severiano Gilbert, M.D.    DATE OF BIRTH:  1946/02/22   DATE OF CONSULTATION:  04/08/2004  DATE OF DISCHARGE:                                   CONSULTATION   SOURCE OF CONSULTATION:  Dr. Chanda Busing.   REASON FOR CONSULTATION:  1.  Atrial fibrillation.  2.  Ischemic cardiomyopathy with sudden cardiac death risk assessment.   HISTORY OF PRESENT ILLNESS:  This is a 65 year old Hispanic female who  presents to [facility] ED after a suicide attempt or, at least, gesture by  intake of a large amount of Tylenol.  She was screened appropriately and  found to have an elevated Tylenol level and was admitted to the hospital  after contacting Poison Control, and appropriate Mucomyst protocol was  initiated.  She was seen by psychiatry and appropriate behavioral therapy  instituted.  Currently, she is not considered an active suicide risk.  The  patient's perioperative care was complicated by the onset of acute rapid-  ventricular-response atrial fibrillation.  She was rate controlled with a  ventral spontaneous conversion to sinus rhythm.  One of the potential  etiologies for this onset of atrial fibrillation was acute beta blocker  withdrawal and underlying ischemic heart disease.  She was taken to the cath  lab after stabilization, and a high-grade right coronary lesion was  identified, and two long Cypher stents were placed.  The patient is  currently being treated actively with antiplatelet drugs to include Plavix  therapy.   PAST MEDICAL HISTORY:  1.  Atherosclerotic coronary artery disease, status post acute myocardial      infarction in the remote past.  Three-vessel coronary artery disease      status post LAD, circumflex and, most recently, right coronary stenting.      She has severe  underlying ischemic cardiomyopathy, ejection fraction 25%      defined most recently by angiography prior to her intervention.  2.  Dyslipidemia.  3.  Diabetes mellitus.  4.  Hypertension.  5.  Paroxysmal atrial fibrillation.  6.  Depression and suicidal ideation, currently no longer suicidal.   MEDICATIONS:  1.  Plavix 75 mg p.o. q.d.  2.  Insulin.  3.  Lisinopril 5 mg q.d.  4.  Aspirin 325 mg q.d.  5.  Coreg 12.5 mg p.o. b.i.d.  6.  Crestor 10 mg q.d.  7.  Potassium supplement.  8.  Pepcid 20 mg q.d.  9.  Integrilin drip.   ALLERGIES:  DARVON.   SOCIAL HISTORY:  She denies tobacco, alcohol or drug use.   FAMILY HISTORY:  Positive for premature atherosclerotic coronary artery  disease.  No history of sudden cardiac death.   REVIEW OF SYSTEMS:  No recent weight change or fevers, sweats or chills.  She does have minor lower extremity arthritic complaints.  No evidence of  dermatitis chronic.  She does have shortness of breath with effort, and this  has been increasing over the last several months.  CARDIOVASCULAR:  Marked  by palpitations.  No real orthopnea or PND.  Remote syncope.  GI:  Does have  chronic dyspepsia.  GU:  Negative frequency or burning.  EYES:  No  glassiness.  ENDO:  She does have symptoms occasionally of diabetes  mellitus.  NEURO:  No recent TIA or seizure symptoms.  HEENT:  Negative.  PSYCH:  She is depressed with recent suicidal ideation.   PHYSICAL EXAMINATION:  GENERAL APPEARANCE:  A pleasant Hispanic female in no  acute distress, seen in bed on the ward.  VITAL SIGNS:  Blood pressure 136/70, afebrile, pulse 72 and regular,  respirations 12-16.  HEENT:  Atraumatic, normocephalic.  Midline nasal septum.  Moist mucous  membranes.  Pupils equal, round and reactive to light and accommodation.  NECK:  Supple.  Two-plus carotids.  No bruit.  No JVP.  No adenopathy.  No  thyromegaly.  CHEST:  Normal female.  BACK:  Negative CAT.  RESPIRATORY:  Clear to  auscultation and percussion.  CARDIOVASCULAR:  Regular rate and rhythm.  Normal S1 and S2.  No murmur.  No  thrill, gallop or rub.  ABDOMEN:  Soft.  Positive bowel sounds.  No hepatosplenomegaly.  No masses  or bruits.  GENITOURINARY/RECTAL:  Deferred.  EXTREMITIES:  No cyanosis, clubbing or edema.  Peripheral pulses were 2+ and  equal.  SKIN:  No areas of easy bruisability or ulceration.  NEURO:  Alert and oriented x 4.  Mood appears to be appropriate.  No  suicidal ideation.  Motor and sensory nonfocal.  Cranial nerves II-XII  appear to be intact.   12-LEAD ELECTROCARDIOGRAM:  Sinus bradycardia.  LVH by voltage.  Old  inferior infarct.  Nonspecific T wave changes.  Telly:  Occasional PACs and  PVCs.  Otherwise, sinus rhythm.  Historically, nonsustained ventricular  tachycardia.  Telemetry in hospital demonstrates a single episode of  accelerated junctional rhythm after conversion to sinus rhythm with 1:1 VA  association, not nonsustained VT.   LABORATORY:  Sodium 134, potassium 3.7, glucose 177, creatinine 0.9, calcium  8.3.   ASSESSMENT:  1.  Acute coronary syndrome, resolved status post implantation of drug-      eluting stents in right coronary.  2.  Suicidal attempt, psychiatrically stabilized on treatment, no suicidal      ideation active.  3.  Severe chronic ischemic dilated cardiomyopathy.  The patient meets class      I indication for ICD implantation.  The only variable at this time is      recent implantation of drug-eluting stents, which will require active      Plavix therapy until at least partial endothelialization, perhaps in      three months.  4.  Congestive heart failure, secondary to left ventricular dysfunction,      currently on good medical management with class II symptoms.  5.  Remote acute myocardial infarction.  6.  Paroxysmal atrial fibrillation.  The patient meets class I indication     for anticoagulation based on low EF and recent episode of  paroxysmal      atrial fibrillation.  Fortunately, the AFib apparently occurring in a      setting of beta blocker withdrawal and acute ischemia, which have now      been reversed.  Unfortunately, she is being actively treated with Plavix      and aspirin, and, therefore, the addition of Coumadin will increase her  bleeding risk.  Since she is now in sinus rhythm, I think it is      reasonable to consider Plavix and aspirin therapy alone for the short      term and followed by implantation of an ICD, at which time, later, the      decision to add Coumadin therapy can be made.   PLAN:  1.  ICD in three months after reevaluation of LVEF when a Plavix window      would be appropriate.  2.  Class I indication for Coumadin therapy, although at this time, we will      withhold given the fact that the patient has resumed sinus rhythm, and      she is on aspirin and Plavix therapy.  This is in consideration of      increased bleeding risk.  3.  Agree with good medical control and therapy of her underlying coronary      artery disease and LV dysfunction.      Mark   MEP/MEDQ  D:  04/08/2004  T:  04/08/2004  Job:  [id number]

## 2010-08-08 NOTE — Consult Note (Signed)
[name], [name]             ACCOUNT NO.:  [id number]   MEDICAL RECORD NO.:  [id number]          PATIENT TYPE:  INP   LOCATION:  2916                         FACILITY:  [facility]   PHYSICIAN:  Doylene Canning. Ladona Ridgel, M.D.  DATE OF BIRTH:  1945/09/26   DATE OF CONSULTATION:  05/29/2005  DATE OF DISCHARGE:                                   CONSULTATION   REFERRING PHYSICIAN:  [name], M.D.   REASON FOR CONSULTATION:  Evaluation of ICD discharge.   HISTORY OF PRESENT ILLNESS:  The patient is a 65 year old woman with a  history of coronary artery disease status post angioplasty and stenting with  a nonischemic cardiomyopathy and severe LV dysfunction with an ejection  fraction of 15%.  The patient underwent prophylactic ICD implantation by Dr.  Alanda Amass back in January of 2007.  She subsequently represented a week  later with atrial fibrillation and a rapid ventricular response resulting in  inappropriate ICD discharges.  At that time, her device was reprogrammed and  she was placed on amiodarone and her beta-blocker was up-titrated.  It is  not clear why, but her amiodarone was stopped, and she was seen by Dr.  Alanda Amass several days ago and the amiodarone was increased.  In the  interim, she has had increasing shortness of breath and congestive heart  failure symptoms.  Today, she was in her usual state of health when she  developed dizziness, severe weakness and this was followed, perhaps 10  seconds later, by an ICD shock.  After this, her symptoms resolved.  She  came to the emergency room and underwent interrogation of her device by Mr.  Kerry Fort, with [employer], and was subsequently found to have  an episode of VT which degenerated into VF, for which she received  appropriate ICD shock.  Additional evaluations demonstrated a potassium of  3.2 and multiple PVCs and nonsustained episodes of VT.   PAST MEDICAL HISTORY:  1.  Hypertension.  2.  Diabetes.  3.   Congestive heart failure.  As previous noted, she has a fairly narrow      QRS.   FAMILY HISTORY:  Notable for coronary disease.  Both her brother and father  died of an MI and she has had one brother with a heart transplant.   SOCIAL HISTORY:  The patient is divorced, living with her son. She denies  tobacco or ethanol use.  She is currently disabled.   MEDICATIONS:  1.  Aspirin.  2.  Ticlid.  3.  Cardizem.  4.  Prilosec.  5.  Enalapril.  6.  Zocor.  7.  Glucotrol.  8.  Lopressor.  9.  Lasix.  10. Nitroglycerin.   ALLERGIES:  She does have a documented allergy to PLAVIX.   REVIEW OF SYSTEMS:  Notable for increasing heart failure symptoms, including  dyspnea, PND and lower extremity swelling.  She also has palpitations and  dizziness.  She denies cough, hemoptysis, claudication.  The rest of her  review of systems were reviewed and found to be negative.   PHYSICAL EXAMINATION:  GENERAL APPEARANCE:  She is  a pleasant, middle-aged  woman in no acute distress.  VITAL SIGNS:  Blood pressure was 150/76, pulse was 60 and irregularly  irregular, respirations were 18, temperature 98, oxygen saturation 98%.  HEENT:  Normocephalic and atraumatic.  Pupils equal and round.  Oropharynx  moist.  Sclerae were anicteric.  NECK:  A 7 cm jugular venous distension.  There was no thyromegaly.  Trachea  was midline.  The carotids were 2+ and symmetric without bruit.  LUNGS:  Clear bilaterally to auscultation.  There were no wheezing, rhonchi  or rales present except for rare basilar rales.  There was no increased work  of breathing.  CARDIOVASCULAR:  There was an irregular rhythm with normal S1 and S2.  There  were multiple irregular beats and a soft systolic murmur at the left lower  sternal border.  I did not appreciate an S3 today.  ABDOMEN:  Soft, nontender and nondistended.  There was on organomegaly.  EXTREMITIES:  No clubbing, cyanosis, or edema.  The pulses were 2+ and  symmetric.   SKIN:  Normal.  MUSCULOSKELETAL:  Joints normal.  NEUROLOGIC:  Alert and oriented x3 with cranial nerves intact.  Strength was  5/5 and symmetric.   EKG demonstrates sinus rhythm with frequent PVCs but no acute STT wave  abnormalities.   ADDITIONAL LABORATORY DATA:  Potassium 3.2, creatinine 1.  Hemoglobin 12.8.  Initial cardiac enzymes were borderline.   Chest x-ray demonstrated no acute disease.   IMPRESSION:  1.  Implantable cardioverter defibrillator shock.  2.  Status post implantable cardioverter defibrillator implantation.  3.  Ischemic cardiomyopathy.  4.  Congestive heart failure.  5.  Frequent PVCs.  6.  Paroxysmal atrial fibrillation.   DISCUSSION:  I discussed treatment options with the patient.  I agree with  Dr. Truett Perna plan for initiating amiodarone therapy.  I think long term she  will need oral amiodarone therapy.  I would recommend two days of  intravenous amiodarone followed by transition to oral amiodarone with the  initial oral dose being 400 twice a day.  Perhaps one week after initiation of amiodarone, we could decrease this does  to 400 mg daily.  With regard to her heart failure medications,  consideration for discontinuation of her calcium blocker and switching to  Coreg from her Lopressor and consideration of Aldactone in conjunction with  Lasix.  I will plan to follow the patient with you.           ______________________________  Doylene Canning. Ladona Ridgel, M.D.     GWT/MEDQ  D:  05/29/2005  T:  06/01/2005  Job:  [id number]   cc:   Gerlene Burdock A. Alanda Amass, M.D.  Fax: [phone]

## 2010-08-08 NOTE — Assessment & Plan Note (Signed)
[facility]                           GASTROENTEROLOGY OFFICE NOTE   NAME:LOCKLEARTylar, Merendino                    MRN:          [id number]  DATE:12/15/2005                            DOB:          1945-08-30    GASTROINTESTINAL PROBLEMS:  1.  Gastroparesis, documented by slow gastric emptying on March 2007      gastric emptying scan.  Likely from longstanding diabetes.      Esophagogastroduodenoscopy September 2007 essentially normal.  2.  Chronic left lower quadrant pains.  Unclear etiology, possibly related      to her longstanding gastroparesis.  Abdominal ultrasound March 2007      showed nonspecific hepatomegaly, stent in her right renal system,      otherwise essentially normal.  CT scan with IV and oral contrast      September 2007 was essentially normal except for some mild      constipation.   INTERVAL HISTORY:  I last saw [place] approximately three weeks ago. She has  proven to be intolerant of Reglan, it caused a lot of anxiety and neurologic  problems, so that was stopped. Doubling her Zofran from 4 mg to 8 mg  completely constipated her. MiraLax, which she started shortly after I saw  her colonoscopy was not tolerated well, she vomited her first dose.   She is not as bothered by her nausea lately but she does still have a mild  baseline nausea and vomits once to twice a week or so. Her bigger complaint  is her left lower quadrant discomfort. She has had no fevers or chills. She  did have a bloody bowel movement 3-4 weeks ago, but none since.   CURRENT MEDICATIONS:  Coumadin, fish oil, Betapace, Enalapril, __________,  furosemide, Lanoxin, Aldactone, Glucotrol, Metoprolol, aspirin, Ticlid,  Humulin, Januvia, and Crestor.   ALLERGIES:  ZOFRAN.  REGLAN.   PHYSICAL EXAMINATION:  VITAL SIGNS: Weight 152 pounds, blood pressure  130/70, pulse 76.  GENERAL: Generally well appearing.  ABDOMEN: Soft, mildly tender in the left upper and  lower quadrant, no  peritoneal signs, nondistended.   ASSESSMENT AND PLAN:  This is a 65 year old woman with diabetic  gastroparesis, history of rectal bleeding, left lower quadrant discomfort.   Her sugars have been running 200-300 recently, she is scheduled to see her  primary care physician, Dr. Rudi Heap, and I explained to her very  clearly that her gastroparesis is best served by getting her on as tight of  a sugar control as is possible. She is intolerant to Zofran and intolerant  to Reglan. She has tried Phenergan, but did like some of the side effects of  that as well. She is currently not too chronically nauseous and so I will  not start her on any nausea medicines this time. She is most bothered by  this left lower quadrant discomfort for which a CT scan was essentially  normal, she was somewhat constipated and that could be contributing to some  of her discomforts. Gastroparesis can also cause abdominal discomforts as  well. Since she has seen some rectal bleeding and has not had a  colonoscopy  in 10-15 years, I think it is  reasonable to repeat that, although I think it is unlikely that she has any  colonic source to her issues.                                   Rachael Fee, MD   DPJ/MedQ  DD:  12/15/2005  DT:  12/17/2005  Job #:  [id number]   cc:   Ernestina Penna, M.D.

## 2010-08-08 NOTE — Op Note (Signed)
NAME:  [name], [name]                       ACCOUNT NO.:  [id number]   MEDICAL RECORD NO.:  [id number]                   PATIENT TYPE:  INP   LOCATION:  2902                                 FACILITY:  [facility]   PHYSICIAN:  Duke Salvia, M.D. [facility]           DATE OF BIRTH:  [birth year]   DATE OF PROCEDURE:  01/12/2002  DATE OF DISCHARGE:                                 OPERATIVE REPORT   PREOPERATIVE DIAGNOSES:  1. Syncope.  2. Ischemic heart disease.   POSTOPERATIVE DIAGNOSES:  Inducible polymorphic ventricular tachycardia.   PROCEDURE:  Invasive electrophysiological study.   DESCRIPTION OF PROCEDURE:  Following the obtaining of informed consent, the  patient was brought to the electrophysiology laboratory and placed on the  fluoroscopic table in the supine position.  After routine prep and drape,  cardiac catheterization was performed with local anesthesia and conscious  sedation.  Noninvasive blood pressure monitoring, transcutaneous oxygen  saturation monitoring, and end-tidal CO2 monitoring were performed  continuously throughout the procedure.  Following the procedure, the  catheters were removed, hemostasis was obtained, and the patient was  returned to the floor in stable condition.   Catheters:  5 French quadripolar catheter was inserted via the left femoral  vein to the AV junction.  A 5 French quadripolar catheter was inserted via  the left femoral vein to the right ventricular apex and moved to the high  right atrium.   Surface leads I, aVF, and V1 were monitored continuously throughout the  procedure.  Following the insertion of the catheters, the stimulation  protocol included incremental atrial pacing, incremental ventricular pacing,  single atrial extrastimuli at a pace cycle length of 600 msec, single  ventricular extrastimuli through the left ventricular apex at 400 and 600  msec and double ventricular extrastimuli through the left ventricular apex  at a  pace cycle length of 400:600 and 600 msec.   RESULTS:  SURFACE ELECTROCARDIOGRAM:  Rhythm:  Sinus.  Cycle length:  846 msec.  PR interval:  198 msec.  QRS duration:  100 msec.  QT interval:  495 msec.  P-wave duration:  143 msec.  Bundle branch block:  Absent.  Pre-excitation:  Absent.   AV NODAL FUNCTION:  AH interval:  103 msec.  HV interval:  60 msec.  His bundle duration:  23 msec.  AV Wenckebach was 430 msec.  VA conduction was dissociated.  The AV nodal effective refractory period at a pace cycle length of 600 msec  was 380 msec.   HIS-PURKINJE SYSTEM FUNCTION (see data above).   ACCESSORY PATHWAY:  No evidence of accessory pathway was identified.   VENTRICULAR RESPONSE TO PROGRAMMED STIMULATION:  Repeated episodes of  polymorphic ventricular tachycardia were inducible with double ventricular  extrastimuli at 400:600:260:240 and 161:096:045.  These both terminated  spontaneously after 10 seconds or were facilitated in termination by very  rapid ventricular pacing.   IMPRESSION:  1. Reproducibly inducible polymorphic ventricular  tachycardia.  2. Questions are raised as to whether there is some ischemia on the one hand     or whether her relative hypokalemia, which was noted after procedure     initiation, was ________.  Because of this, prior to any further     decisions, her potassium will be repeated and the ________ stimulation     will be repeated.  The rest of the electrophysiological study was normal.                                               Duke Salvia, M.D. [facility]    SCK/MEDQ  D:  01/13/2002  T:  01/13/2002  Job:  [id number]

## 2010-08-08 NOTE — Op Note (Signed)
NAME:  [name], [name]                       ACCOUNT NO.:  [id number]   MEDICAL RECORD NO.:  [id number]                   PATIENT TYPE:  INP   LOCATION:  2902                                 FACILITY:  [facility]   PHYSICIAN:  Duke Salvia, M.D. [facility]           DATE OF BIRTH:  02/05/1946   DATE OF PROCEDURE:  01/13/2002  DATE OF DISCHARGE:                                 OPERATIVE REPORT   PREOPERATIVE DIAGNOSES:  1. Syncope.  2. Ischemic heart disease.   POSTOPERATIVE DIAGNOSIS:  No inducible arrhythmias.   PROCEDURE:  Limited invasive electrophysiological study.   DESCRIPTION OF PROCEDURE:  Following the obtaining of informed consent, the  patient was brought back to the electrophysiology laboratory and placed on  the fluoroscopic table in the supine position.  After routine prep and  drape, cardiac catheterization was again performed via her left femoral  vein.  At the end of the procedure the catheter was removed, hemostasis was  obtained, and the patient was transferred back to the floor in stable  condition.   Catheters:  A 5 French quadripolar catheter was inserted via the left  femoral vein to the right ventricular apex and subsequently moved to the  right ventricular outflow tract.   Surface leads I, aVF, and V1 were monitored continuously throughout the  procedure.  Following insertion of the catheters, the stimulation protocol  included double and triple ventricular extrastimuli from the right  ventricular apex and the right ventricular outflow tract at paced cycle  lengths of 600 and 400 msec and single and double ventricular extrastimuli  from the right ventricular apex at a pace cycle length of 400:600 msec.   RESULTS:  Surface measurements were again obtained and notable today for the  relative QT shortening from 495 msec to 413 msec.   RESPONSE TO PROGRAMMED VENTRICULAR STIMULATION:  The effective refractory  period at the right ventricular apex was 260 msec  and at 400 msec was also  260 msec.  The effective refractory period at the right ventricular outflow tract at a  pace cycle length of 600 msec was 250 msec and at 400 msec was 260 msec.   The closest coupling interval at the right ventricular apex at a pace cycle  length of 600 msec was 280:220:200, and at 400 msec was 280:210:210.   The closest coupling interval at the right ventricular outflow tract at a  pace cycle length of 550 msec was 270:230:210, and at 400 msec was  270:220:210.   ARRHYTHMIAS INDUCED:  None.   IMPRESSION:  No inducible ventricular arrhythmias.   SUMMARY AND CONCLUSION:  The results of electrophysiological testing failed  to identify the substrate for malignant ventricular arrhythmias.  It is  likely that the inducible polymorphic ventricular tachycardia was indeed  retrospectively related to the modest hypokalemia.  Clearly, maintenance of  her potassium is going to be important for assurance of electrical  stability.  In addition, given the high risks associated with cardiomyopathy and  syncope, further risk stratification may be useful.  I will discuss the role  of T-wave alternans testing with Dr. Katrinka Blazing.  However, her ejection fraction  has improved significantly and is now in the 47% range per MUGA study  yesterday, and so further testing may be deferred.                                                   Duke Salvia, M.D. [facility]    SCK/MEDQ  D:  01/13/2002  T:  01/13/2002  Job:  [id number]

## 2010-08-08 NOTE — H&P (Signed)
[name], [name]             ACCOUNT NO.:  [id number]   MEDICAL RECORD NO.:  [id number]          PATIENT TYPE:  EMS   LOCATION:  MAJO                         FACILITY:  [facility]   PHYSICIAN:  Hollice Espy, M.D.DATE OF BIRTH:  Aug 16, 1945   DATE OF ADMISSION:  04/02/2004  DATE OF DISCHARGE:                                HISTORY & PHYSICAL   ATTENDING PHYSICIAN:  Corinna L. Lendell Caprice, M.D.   PRIMARY CARE PHYSICIAN:  None.   CONSULTATIONS:  Antonietta Breach, M.D. of psychiatry.   CHIEF COMPLAINT:  Tylenol overdose.   HISTORY OF PRESENT ILLNESS:  The patient is a 65 year old Hispanic female  with past medical history of hypertension and diabetes who presents to the  emergency room after taking an unknown quantity of pain killer.  This was a  suicide attempt.  The patient states that she has been under a lot of stress  lately and she has never had any previous plans to kill herself, but she  says she would not elaborate, but said that she was under a lot of stress at  both home and work.  She said she finally just could not take it and just  took a large quantity of some generic pain killers.  Initially it was  thought to be aspirin.  The patient was then sent to the emergency room by  her family.  In discussion with the ER attending, initially it was thought  to be aspirin, but a salicylate level was checked on the patient and was  found to be less than 4, considered within normal range.  A Tylenol level  was checked and found to be 281.  This level was checked at 6:13 p.m.  A  urine drug screen and tricyclic screen was checked and those were normal as  well.  An alcohol level was normal.  The rest of the patient's labs were  unremarkable except for an elevated glucose at 236.  However, the patient's  labs were also notable for a slightly elevated creatinine of 1.5.  A follow-  up Tylenol level approximately at 10 p.m. noted a level greater than 300.  Poison Control was  contacted and the patient underwent the Mucomyst protocol  with 140 mg/kg.  This first dose was given at midnight.  The patient states  that she has not had any plans to kill herself previously, but this was  indeed an attempt.  She has had no previous problems, no previous  psychiatric history.  She does not take any medicines for depression.  She  states that she takes medicines for her hypertension and blood pressure, but  she still has ups and downs with those. She was seeing a Dr. Katrinka Blazing, but her  insurance changed and now she saw a doctor briefly in [facility], but is  looking for a new doctor.  Currently, the patient states that she feels very  tired.  She denies any headaches, visual changes, dysphagia, chest pain,  palpitations, shortness of breath, wheeze, cough, abdominal pain, hematuria,  or dysuria.   PAST MEDICAL HISTORY:  Hypertension and diabetes.   MEDICATIONS:  She cannot remember what medicines she takes, but she says she  takes Glucophage and two other diabetic medicines recently added, but she  does not know what they are.  She also takes an antihypertensive medicine  that starts with an I as well as Toprol.   ALLERGIES:  DARVON.   SOCIAL HISTORY:  She denies any tobacco, alcohol, or drug use.   FAMILY HISTORY:  Noncontributory.   PHYSICAL EXAMINATION:  VITAL SIGNS:  Temperature 97.2, heart rate 82, blood  pressure 169/90, respirations 18, O2 saturation 99% on 2 liters.  GENERAL:  The patient appears to be alert and oriented x3 and in no acute  distress.  HEENT:  Normocephalic and atraumatic.  Her mucous membranes are dry.  She  has no carotid bruits.  HEART:  Regular rate and rhythm.  S1 and S2.  LUNGS:  Clear to auscultation bilaterally.  ABDOMEN:  Soft, nontender, and nondistended.  Positive bowel sounds.  EXTREMITIES:  No cyanosis, clubbing, or edema.   LABORATORY DATA:  The patient's white count is 7.5, H&H 12.9 and 37.4, MCV  84, platelet count 414.   Salicylate level less than 4, Tylenol level first  one is 281.  TCA and urine drug screen are negative.  Alcohol level less  than 5.  Sodium 139, potassium 4, chloride 110, bicarb 21, BUN 22, glucose  236, pH 7.42, pCO2 32, creatinine 1.5.  Second Tylenol level was greater  than 300.  LFT's were all within normal limits.  These were checked at 10  p.m. with the second Tylenol level.   ASSESSMENT:  1.  Tylenol overdose.  Will follow the Mucomyst protocol.  The patient will      need Mucomyst 70 mg/kg which will work out to about 4900 mg every four      hours x17 doses.  In addition will check Tylenol level q.2h with the      next dose at 2:30 a.m. until levels are stable and then start to      decline.  2.  Suicide attempt.  Will contact behavioral health who have already seen      the patient.  Dr. Jeanie Sewer will see the patient in the morning.  24-      hour sitter for now.  3.  Hypertension.  The patient is unsure of her medications.  Will continue      Toprol.  4.  Diabetes mellitus.  Will hold the patient's Glucophage given her liver      dysfunction and put her on insulin sliding scale.      Send   SKK/MEDQ  D:  04/03/2004  T:  04/03/2004  Job:  [id number]   cc:   Antonietta Breach, M.D.

## 2010-08-08 NOTE — Discharge Summary (Signed)
[name], [name]             ACCOUNT NO.:  [id number]   MEDICAL RECORD NO.:  [id number]          PATIENT TYPE:  INP   LOCATION:  2014                         FACILITY:  [facility]   PHYSICIAN:  Darlin Priestly, MD  DATE OF BIRTH:  1946-02-08   DATE OF ADMISSION:  04/18/2005  DATE OF DISCHARGE:  04/22/2005                                 DISCHARGE SUMMARY   DISCHARGE DIAGNOSES:  1.  Multiple episodes of ventricular tachycardia reported by multiple      implantable cardioverter-defibrillator discharges.  2.  Status post recent implantable cardioverter-defibrillator implantation      for nonsustained ventricular tachycardia and ejection fraction      approximately 15%.  3.  Peripheral vascular disease.  4.  Status post left renal artery stenting with reduction of the lesion from      99% to 0% on April 01, 2004.  5.  Known coronary artery disease with prior stenting of the right coronary      artery by Dr. Elsie Lincoln and remotely bare-metal stents to the mid left      anterior descending in 2000.  6.  Status post cutting balloon arterectomy and placement of drug-eluting      stent in the previously stented region of the right coronary artery.  7.  Sick sinus syndrome.  8.  Paroxysmal atrial fibrillation with episodes of rapid ventricular      tachycardia.  9.  Systemic hypertension.  10. Adult onset diabetes mellitus.  11. Hyperlipidemia.   This is a 65 year old lady who was readmitted after a recent hospitalization  to [facility] because of the multiple discharges of her new ICD  that was implanted on April 08, 2005, by Dr. Alanda Amass. She was admitted  to the telemetry unit. We called a pacer rep. They came and interrogated the  pacer, readjusted the parameters but the same day later in the afternoon the  patient developed 3 runs of long ventricular tachycardia and her ICD went  off every time with each run.   The very same day of her admission the patient was  transferred to the ICU  and started on esmolol and amiodarone drip. The next day she was seen by Dr.  Ladona Ridgel in hospital consult on April 20, 2005. Electrophysiology consult by  Dr. Ladona Ridgel and he evaluated the patient and decided that she would be a good  candidate to start for amiodarone and beta-blocker therapies as an  outpatient and felt like it is okay to switch the patient to oral  administration of amiodarone. Also, he recommended to consider increasing VT  zone to 190 to 200 range.   The patient was seen by Dr. Alanda Amass on January 30 and she at that time did  not have any recurrent episodes of nonsustained VT. Dr. Alanda Amass wanted to  keep her one more day in the hospital to make sure that the patient did not  have anymore rounds of SVT.   This morning, she was seen by Dr. Jacinto Halim and she made a note in the chart  that the patient has not had any nonsustained ventricular tachycardia  for  greater than 48 hours and stable to go home. The decision to initiate her  anticoagulation therapy for PAF will be deferred to primary cardiologist and  there will need to decide if the patient needs to be on Coumadin alone or  Coumadin and aspirin.   DISCHARGE MEDICATIONS:  1.  Aspirin 81 mg q.d.  2.  Ticlid 250 mg b.i.d.  3.  Digoxin 0.125 mg q.d.  4.  Cardizem 120 mg q.d.  5.  Enalapril 2.5 mg b.i.d.  6.  Glucotrol 5 mg b.i.d.  7.  Zocor 40 mg q.d.  8.  Lopressor 100 mg b.i.d.  9.  Amiodarone 400 mg t.i.d. up until February 2. On February 2 she is      supposed to start amiodarone 400 mg b.i.d.   DISCHARGE DIET:  Low fat, low cholesterol diet.   DISCHARGE INSTRUCTIONS:  She was instructed to increase activity slowly. She  was not allowed to take a bath tub or jacuzzi tub and she was allowed to  take a shower and instructed to wipe and dry incision right away after the  shower. Do not let it stay wet and she was not allowed to drive up until she  is seen in the office. Appointment  with Dr. __________ scheduled on April 24, 2005 at 2:30 p.m. and she needs also an appointment with Dr. Alanda Amass to  follow up ICD and the office will call to schedule this appointment.   Cc:  __________ Cardiology      Raymon Mutton, P.A.      Darlin Priestly, MD  Electronically Signed    MK/MEDQ  D:  04/22/2005  T:  04/22/2005  Job:  [id number]

## 2010-08-08 NOTE — Cardiovascular Report (Signed)
[name], [name] NO.:  [id number]   MEDICAL RECORD NO.:  [id number]          PATIENT TYPE:  INP   LOCATION:  6522                         FACILITY:  [facility]   PHYSICIAN:  Nanetta Batty, M.D.   DATE OF BIRTH:  22-Feb-1946   DATE OF PROCEDURE:  04/09/2005  DATE OF DISCHARGE:                              CARDIAC CATHETERIZATION   PROCEDURE:  Abdominal aortogram/selective right-and-left renal artery  angiograms/PTA and stent of left renal artery.   [name] is a 65 year old female with a history of ischemic  cardiomyopathy status post stenting of the RCA and LAD in the past.  She is  admitted because of syncope and has positive enzymes.  Angiography April 06, 2005 revealed high-grade distal RCA InStent restenosis which was  restented using a CYPHER drug-eluting stent after a cutting balloon  arthrectomy.  She also had a 60% circumflex lesion and an EF of 15%.  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  She underwent a  prophylactic ICD implant by Dr. Susa Griffins for primary intervention  of sudden death.  She presents, now, for a left renal artery PTA and  stenting for treatment of renal vascular/hypertension.   PROCEDURE DESCRIPTION:  The patient was brought to the sixth floor [facility] in the postabsorptive state. She  was premedicated with p.o. Valium. The right groin was prepped and shaved in  the usual sterile fashion.  Xylocaine 1% was used for local anesthesia. A 6-  French sheath was inserted into the right femoral artery using standard  Seldinger technique. A 5-French pigtail catheter was used for midstream and  distal abdominal aortography.  Visipaque dye was used throughout the  entirety of the case. The aortic pressures were monitored during the case.  Selective angiography was performed of the right and left renal artery.   ANGIOGRAPHIC RESULTS:  1.  Right renal artery 30-40%  ostial stenosis.  2.  Left renal artery, 95-99% eccentric proximal stenosis.  3.  Infrarenal abdominal aorta and iliac bifurcations; free of systemic      disease.   PROCEDURE DESCRIPTION:  The patient received 2500 units of heparin  intravenously.  Using a 6-French IMA, short guide catheter along with an  __________ stabilizer wire.  Access was attained into the left renal artery.  PTA was performed with a 4 x 15 Aviator nominal pressures inspecting with a  6 x 15 GENESIS on Aviator balloon stent premount at 10 atmospheres resulting  in reduction of a 99% eccentric proximal left renal artery stenosis with a  0% residual without dissection and excellent flow.  The patient tolerated  the procedure well.  The guidewire and catheter were removed.  The ACTs  measured less than 200 and the sheath was removed.  Pressure applied to the  groin to  achieve hemostasis. The patient left the lab in stable condition. Plans will  be to treat her with aspirin and Ticlid (PLAVIX ALLERGY), gently hydrate her  and check her labs in the morning when she will be discharged home to  followup with Dr.  Chanda Busing.  She left the lab in stable condition.      Nanetta Batty, M.D.  Electronically Signed     JB/MEDQ  D:  04/09/2005  T:  04/10/2005  Job:  [id number]   cc:   [facility] [place]   [facility]  [address]  [place]  [place]  [address]   [name], M.D.  Fax: [phone]

## 2010-08-08 NOTE — Cardiovascular Report (Signed)
NAME:  [name], SEEDORF NO.:  [id number]   MEDICAL RECORD NO.:  [id number]          PATIENT TYPE:  INP   LOCATION:  6522                         FACILITY:  [facility]   PHYSICIAN:  Nanetta Batty, M.D.   DATE OF BIRTH:  11/04/1945   DATE OF PROCEDURE:  DATE OF DISCHARGE:                              CARDIAC CATHETERIZATION   CARDIAC CATHETERIZATION/PERCUTANEOUS CORONARY INTERVENTION STENT REPORT   [name] is a 65 year old female with a history of prior CAD status post  LAD proximal and mid-stents in the past as well as diffuse mid and distal  RCA stenting by Dr. [name] approximately one year ago  She has  ischemic cardiomyopathy.  She was admitted with syncope and had positive  enzymes.  She presents now for dye method coronary arteriography.   PROCEDURE DESCRIPTION:  The patient was brought to the second floor [facility] cardiac cath lab in the postabsorptive state. She was premedicated with  p.o. Valium.  Her right groin was prepped and shaved in the usual sterile  fashion.  1% Xylocaine was used for local anesthesia.  A 6-French sheath was  inserted into the right femoral artery using the standard Seldinger  technique.  A 6-French right and left Judkins Silastic catheter as well as a  6-French pigtail catheter were used for selective coronary angiography, left  ventriculography and distal abdominal aortography.  Visipaque dye was used  for the entirety of the case.  Retrograde aorta, left ventricular blow-by  pressures were recorded.   HEMODYNAMICS:  1.  Aortic systolic pressure 181.  Diastolic pressure 56.  2.  Left ventricular systolic pressure 181 and diastolic pressure 11.   SELECTIVE CORONARY ANGIOGRAPHY:  1.  Left main:  Normal.  2.  LAD:  The LAD had patent stents to the proximal and mid portion.  3.  Left circumflex:  60% stenosis in the mid portion.  4.  Right coronary artery:  90% in-stent restenosis in the distal portion of      the  previously placed stent.  There was then 67% segmental stenosis just      after this.   LEFT VENTRICULOGRAPHY:  RAO left ventriculogram was performed using 25 mL of  Visipaque dye at 10 mL per second.  The overall LV EF was estimated at  approximately 15% with severe global hypokinesia.   DISTAL ABDOMINAL AORTOGRAPHY:  Distal abdominal aortogram was performed  using 20 mL of Visipaque dye at 20 mL per second.  There was approximately  40-50% proximal right renal artery stenosis and 95% left renal artery  stenosis at the origin.  The infrarenal abdominal aorta and the iliac  bifurcation appear free of significant atherosclerotic changes.   IMPRESSION:  [name] has high-grade in-stent restenosis within the  previously placed Cypher stents in the distal portion of the genu of the  dominant RCA placed one year ago.  The patient also has 60% hypodense  segmental disease just distal to the stented segment.  Patient is Plavix  allergic and breaks out in a macular papular rash.  We will proceed with  percutaneous coronary intervention,  cutting balloon atherectomy,  posttraumatic stenting using heparin and Integrilin.   The existing 6-French sheath in the right femoral artery was exchanged via  wire for a 7-French sheath. A 6-French sheath was then inserted into the  right femoral vein.  The patient received 4500 units of heparin  intravenously with the NEST of greater than 230.  She received Integrilin  double bolus infusion as well as aspirin 3 chewable.  She did not receive  Plavix.   Using a 7-French hockey stick with sidehole guide catheter along with an L  1.4, 190 Asahi wire and a 2.5, 12 Quantum Maverick PCI was performed of the  distal RCA.  This was inflated at nominal pressures.  There was significant  fibroelasticity.  Following this, a 2756 cutting balloon was then used to  perform atherectomy at the lesion site twice, up to 8 to 9 atmospheres.  This also resulted in recoil.   Following this, a 308 Cypher drug-eluting  stent was placed across the lesion using the stent sandwich technique and  deployed at 15 atmospheres.  This was then post-dilated with a 308 Quantum  Maverick up to 22 atmospheres resulting in reduction of a 90% fairly focal  distal RCA in-stent restenosis, less than 20% residual.  The patient  tolerated the procedure well.  She did have a short run of nonsustained  ventricular tachycardia at a fast cycle length.  The guidewire and catheters  were removed.  The sheath was sewn securely in placed.  The patient left the  lab in stable condition.  She will be gently hydrated and continued with  aspirin, Ticlid and Integrilin for 24 hours.  She is an excellent candidate  for a ICD for secondary prevention of sudden death.      Nanetta Batty, M.D.  Electronically Signed     JB/MEDQ  D:  04/06/2005  T:  04/07/2005  Job:  [id number]   cc:   [facility]  [address]  [place], [place]  [address]

## 2010-08-08 NOTE — Discharge Summary (Signed)
[name], [name]             ACCOUNT NO.:  [id number]   MEDICAL RECORD NO.:  [id number]          PATIENT TYPE:  INP   LOCATION:  4739                         FACILITY:  [facility]   PHYSICIAN:  [name], M.D.DATE OF BIRTH:  April 02, 1945   DATE OF ADMISSION:  06/11/2005  DATE OF DISCHARGE:  06/19/2005                                 DISCHARGE SUMMARY   DISCHARGE DIAGNOSES:  1.  Ventricular tachycardia with implantable cardioverter defibrillator      fibrillation with two discharges.  2.  Gastroparesis with diabetes mellitus causing nausea.  3.  Nonischemic cardiomyopathy.  4.  Coronary artery disease with recent history of stents.  5.  Renal artery stent.  Last Doppler was March of 2007 after stent was      placed.  6.  Plavix allergy.   CONDITION ON DISCHARGE:  Improved.   CONSULTATIONS:  1.  Dr. Doylene Canning. Ladona Ridgel for EP consult.  2.  Dr. Graylin Shiver for GI consult.   Please note on the Reglan it will be 10 mg a half a tablet a.c. and h.s.   HISTORY OF PRESENT ILLNESS:  A 65 year old female presented to the emergency  room with complaint of AICD shock.  She was awakened from sleep diaphoretic  with chest pain, lightheaded with shock.  She developed a headache.  Her  diaphoresis stopped.  She felt cold and heaviness in her chest.  She was  discharged June 04, 2005 after admission for ICD discharge.  Amiodarone had  been stopped secondary to nausea and restarted after admission.  Also  potassium was low.  Since she has been home, no nausea until yesterday  afternoon after taking evening medications.  Vomiting for about 10 minutes  after a pill.  She also noted swelling in her left leg, left arm and  abdomen.  At discharge, potassium was stopped with an Aldactone addition.  She started taking Lasix at home and her potassium on admission was 3.4 with  a magnesium of 1.9.   PAST MEDICAL HISTORY:  1.  Coronary disease with DES stent to the RCA January of 2007.  Stent  to      the RCA in 2000.  2.  Peripheral vascular disease with left renal artery stent in January and      she had her last Dopplers in March of this year.  3.  A defibrillator was placed March 29, 2005.  4.  She has ischemic cardiomyopathy, EF of 15-20%.  At catheterization, she      was 20-25%.  She had ventricular tachycardia with syncope.  5.  She has hypertension.  6.  Hyperlipidemia.  7.  Adult onset diabetes mellitus.  8.  Sick sinus syndrome.  9.  PAF.  10. Syncope.  11. Cholecystectomy.   OUTPATIENT MEDICATIONS:  1.  Fish oil twice a day.  2.  Amiodarone 200 daily.  3.  Metoprolol 100 b.i.d.  4.  Lanoxin 0.125 daily.  5.  Ticlid 250 b.i.d.  6.  Aspirin 81 daily.  7.  Furosemide 40 daily.  8.  Aldactone 25 daily.  9.  Enalapril  2.5 daily.  10. Coumadin held for three days.  She was taking 2.5 starting on Monday.  11. Glipizide 10 daily.  12. NPH insulin 15 units b.i.d.  13. Simvastatin 40 h.s.  14. Prilosec daily.  15. Darvocet.   ALLERGIES:  PLAVIX causes a rash.  DARVON indigestion.   FAMILY HISTORY:  See H&P.   SOCIAL HISTORY:  See H&P.   REVIEW OF SYSTEMS:  See H&P.   DISCHARGE PHYSICAL EXAMINATION:  Blood pressure 144/80, pulse 60,  respiration 18, temperature 97.9.  Oxygen saturation 98%.  Accu-Cheks were  in the 117 to 214.  INR today was 3.4.  Heart regular rate and rhythm with  systolic murmur.  Lungs were clear.  Abdomen soft bowel sounds.   LABORATORY DATA:  Hemoglobin on admission 13.7, hemoglobin a 40.3, WBC 8.4,  platelets 341,000 and stable.  Pro time on admission 27, INR of 2.5, PTT 43.  She remained therapeutic in the hospital.   Chemistry showed sodium 139, potassium 3.4, chloride 107, glucose 107, BUN  21.  These remained stable.  Creatinine 1.1 and BUN 20.  Calcium 9.4, total  protein 7.6, albumin 4.3, AST 33, ALT 24, ALP 96, total bilirubin 0.6,  magnesium 1.9, amylase 147, lipase 48.  Potassium at discharge was at least  4.4.  CK 43,  MB 1.2, troponin I 0.07.  Other CK 46 and 44, MB 1.1 and 1.0,  troponin I 0.6.   Lipids showed total cholesterol 175, triglycerides 115, HDL 44, LDL 108.  Digoxin level was 1.0.  H. pylori was 3.1 which was elevated.  Chest x-ray  on admission showed no active cardiopulmonary disease.  On June 14, 2005,  abdominal ultrasound showed mild nonspecific hepatomegaly.  Stent in the  right renal collecting system with no hydronephrosis.  Probable small  peripelvic cyst on the left.  EKG showed AV sequential pacing.   HOSPITAL COURSE:  Ms. [name] was admitted by Dr. Alanda Amass secondary to  defibrillator discharge.  Dr. Lewayne Bunting was also consulted and Dr. Ladona Ridgel  had evaluated her previously.  He would like to have kept her on high dose  of amiodarone but her nausea precluded this treatment.  So, she was changed  to sotalol as well as titration down of Lopressor with sotalol being  started.   Her blood pressure was elevated and she also had increase hypertension.  So,  Vasotec was increased.   Due to persistent nausea, GI consult was obtained.  Ultrasound was done.  GI  saw the patient.  Gastric emptying study showed marked gastroparesis and we  have added Reglan to her medical regimen.  She was stable without further  episodes of ventricular tachycardia on the sotalol.  By June 19, 2005, she  was stable and ready for discharge home.      [name]. [name], N.P.    ______________________________  [name], M.D.    LRI/MEDQ  D:  06/19/2005  T:  06/22/2005  Job:  [id number]   cc:   [name], M.D.  Fax: [phone]   Jeni Salles, M.D.  Fax: [phone]   Doylene Canning. Ladona Ridgel, M.D.  [address]  [address]  [place]  [place] [address]

## 2010-08-08 NOTE — Consult Note (Signed)
NAMETENIOLA, TSENG NO.:  [id number]   MEDICAL RECORD NO.:  [id number]          PATIENT TYPE:  INP   LOCATION:  4739                         FACILITY:  [facility]   PHYSICIAN:  Graylin Shiver, M.D.   DATE OF BIRTH:  1945/06/17   DATE OF CONSULTATION:  06/16/2005  DATE OF DISCHARGE:                                   CONSULTATION   REASON FOR CONSULTATION:  Nausea.   The patient is a 65 year old female who GI is being asked to see because of  nausea.  The patient states that she has had significant nausea now since  February.  It seems to be worse after she eats.  She has been vomiting at  times also.  She does get a fullness in her abdomen after she eats.  She  denied any hematemesis.   In reviewing old records in February 2004, she had a normal upper GI series  but had a nuclear medicine gastric emptying study done because there had  been retained food on that upper GI series.  The gastric emptying study was  markedly abnormal with 95% of the material remaining in her stomach after  two hours.  She was diagnosed at that time with gastroparesis.  She does not  recall the specific treatment she was on back then for it.  She states it  did get better.   She is not having any abdominal pain or heartburn.   PAST HISTORY:  1.  Hypertension.  2.  Diabetes.  3.  Congestive heart failure.  4.  She has a history of ischemic cardiomyopathy, PVCs, paroxysmal atrial      fibrillation.  She has a cardiac defibrillator.   ALLERGIES:  PLAVIX.   CURRENT MEDICATIONS:  Omega three, Lanoxin, aspirin, furosemide, potassium  chloride, Ticlid, Aldactone, Coumadin, Glucotrol, Zocor, Protonix, insulin,  Lopressor, Betapace, Vasotec, Zofran for nausea.   REVIEW OF SYSTEMS:  No complaints of chest pain or shortness of breath at  this time.   PHYSICAL:  She is not in any acute distress nonicteric.  Heart regular  rhythm.  Lungs were clear.  Abdomen is soft, nontender, no  hepatosplenomegaly.   IMPRESSION:  Nausea, I suspect that this is probably due to gastroparesis.   PLAN:  Upper GI and nuclear medicine gastric emptying study will be done to  reassess.  If this is gastroparesis again causing the problem it could be  secondary to her diabetes and she may benefit from the use of either Reglan  or Zelnorm.           ______________________________  Graylin Shiver, M.D.     SFG/MEDQ  D:  06/16/2005  T:  06/17/2005  Job:  [id number]   cc:   [name], M.D.  Fax: [phone]

## 2010-08-08 NOTE — Discharge Summary (Signed)
[name], [name]             ACCOUNT NO.:  [id number]   MEDICAL RECORD NO.:  [id number]          PATIENT TYPE:  INP   LOCATION:  2001                         FACILITY:  [facility]   PHYSICIAN:  [name], M.D.DATE OF BIRTH:  04-Jan-1946   DATE OF ADMISSION:  05/29/2005  DATE OF DISCHARGE:  06/04/2005                                 DISCHARGE SUMMARY   DISCHARGE DIAGNOSIS:  1.  Ventricular tachycardia in the setting of hypokalemia, off amiodarone,      status post discharge x2.  2.  Known ischemic cardiomyopathy with an ejection fracture of 15%.  3.  Coronary disease, her last percutaneous intervention was January 2007.  4.  Peripheral vascular disease with 95% left renal artery stenosis.  5.  Insulin-dependent diabetes.  6.  Plavix intolerance; the patient is on Ticlid, which she has tolerated      for some time.  Is tolerating for some time.  7.  Nausea, felt to be secondary amiodarone which was decreased at      discharge.  8.  Implantable cardioverter-defibrillator implant April 08, 2005.   HOSPITAL COURSE:  The patient is a 65 year old female known to Dr. Elsie Lincoln  with history of ischemic cardiomyopathy and coronary disease.  She had a  remote RCA stent in 2000, and a stent to the RCA April 06, 2005 for in-  stent restenosis.  This was a Cypher stent.  She had an implantable  defibrillator in January 2007 by Dr. Alanda Amass.  Her EF is 15% by  echocardiogram and 25% at catheterization.  She apparently had been on  amiodarone for PAF as an outpatient, but this was stopped because of some  nausea.  The patient presented May 29, 2005, after her ICD discharged  twice.  She was seen by Dr. Elsie Lincoln on admission.  Her potassium was 3.2.  She was given potassium supplement and admitted and seen by Dr. Sharrell Ku.  Apparently her amiodarone had recently been reinstituted by Dr. Alanda Amass as  an outpatient.  She was loaded with IV amiodarone and then changed to 400 mg  p.o.  b.i.d.  A chest x-ray showed clear lung fields.  She did have some  nausea when the amiodarone was changed to p.o.  The dose was cut back to 100  mg b.i.d. and then at discharge we changed it to 200 mg h.s.  She still has  some episodes of nausea.  Dr. Jacinto Halim saw her on June 04, 2005.  He explained  that she may have to be with some degree of nausea as other options were not  good.  She also has problems financially.  She was seen by the financial  counseling services.  Great efforts were taken to make sure she was able to  have generics and help we could provide her was provided at discharge.  There was consideration for starting her on Bidil, but we had no samples in  the office, and the patient could certainly not afford this as a self pay  patient.  We did have Aldactone.  We feel she can be discharged June 04, 2005.  DISCHARGE MEDICATIONS:  1.  Amiodarone 200 mg a day.  2.  Metoprolol 100 mg b.i.d.  3.  Lanoxin 0.125 mg a day.  4.  Ticlid 250 mg b.i.d.  5.  Coumadin; she will hold this today and Friday and Saturday and then on      Sunday she will take 2.5 mg and have a pro time on Monday.  Her INR has      gone up to 6 since her Amiodarone was resumed.  6.  She is on also on glipizide 10 mg a day.  7.  NPH 15 units twice a day.  8.  Simvastatin 40 mg a day.  9.  Aspirin 81 mg a day.  10. Aldactone 25 mg a day.  11. Enalapril 2.5 mg a day.  12. Prilosec OTC.   LABORATORY DATA:  Labs at discharge - her INR is 5.9.  White count 5.8,  hemoglobin 12.7, hematocrit 37.5, platelets 246,000.  Sodium 140, potassium  4.2, BUN 10, creatinine 1.0.  Troponins were slightly elevated 0.6-0.8.  LFTs were normal.  Magnesium was 1.6 on admission.  This was replaced with  oral magnesium supplement.  Digoxin level 0.6.  On admission, followup was  1.1, BMP 156.9.  TSH 1.44, hemoglobin A1c 8.4.  Chest x-ray shows no acute  findings.  EKG shows sinus rhythm, PVCs.   DISPOSITION:  The patient  is discharged in stable condition and will  followup with Dr. Elsie Lincoln.  It is apparent that much of her problem may be  affording her medications. Dr. Elsie Lincoln has encouraged her to seek disability  and will help her with this as much as he can.  We have also arranged for  the patient to be seen at [facility].      Abelino Derrick, P.A.    ______________________________  [name], M.D.    Lenard Lance  D:  06/04/2005  T:  06/05/2005  Job:  [id number]   cc:   [name], M.D.  Fax: [phone]   [facility]

## 2010-08-08 NOTE — Consult Note (Signed)
[name], [name] NO.:  [id number]   MEDICAL RECORD NO.:  [id number]          PATIENT TYPE:  INP   LOCATION:  2107                         FACILITY:  [facility]   PHYSICIAN:  Doylene Canning. Ladona Ridgel, M.D.  DATE OF BIRTH:  [birth year]   DATE OF CONSULTATION:  04/20/2005  DATE OF DISCHARGE:                                   CONSULTATION   CONSULTATION IS REQUESTED BY:  Dr. Nanetta Batty.   INDICATION FOR CONSULTATION:  Evaluation of recurrent ICD discharge in the  setting of ventricular tachycardia and atrial fibrillation.   HISTORY OF PRESENT ILLNESS:  The patient is a very pleasant 65 year old  woman with a history of nonischemic cardiomyopathy and syncope, as well as  hypertension and dyslipidemia, and peripheral vascular disease.  She also  has diabetes.  She underwent ICD implantation several weeks ago after all of  the above were determined and was discharged home.  The patient was admitted  to the hospital after having recurrent ICD discharges.  She was placed on  amiodarone and beta blocker appropriately.  Her cerebral artery was  interrogated.  It subsequently had been discovered that she had episodes of  atrial fibrillation with rapid ventricular response, as well as episodes of  nonsustained VT, which were coupled closely together resultant in her  detection window thinking that the VT was sustained and delivering ICD  shocks.  She is now referred for additional evaluation.   PAST MEDICAL HISTORY:  As noted above.  There is also a history of a medical  noncompliance.  She has a history of stenting to the RCA back in January of  2006 with InStent restenosis documented in January of 2007.  She has severe  LV dysfunction as noted, which has been long-standing.  She has a history of  paroxysmal AFIB and sick sinus syndrome.  She has a history of hypertension.  She has a history of renovascular disease.   FAMILY HISTORY:  Notable for no premature coronary artery  disease.   SOCIAL HISTORY:  The patient is divorced.  She denies tobacco or alcohol  use.  She has a history of allergy to Plavix.   REVIEW OF SYSTEMS:  As noted in the HPI, otherwise all other systems were  reviewed and found to be negative except for occasional fatigue and  weakness.   PHYSICAL EXAMINATION:  VITAL SIGNS:  The blood pressure was 115/70, the  pulse was 60 and regular, respirations 18, temperature is 98.  GENERAL:  She is a pleasant well appearing middle-aged woman in no distress.  HEENT:  Normocephalic, atraumatic.  Pupils equal and round.  The oropharynx  is moist.  Sclerae are anicteric.  NECK:  Revealed 8 cm of jugular venous distention and carotids were 2+ and  symmetric.  LUNGS:  Clear to bilateral auscultation.  There are no wheezing, rales or  rhonchi.  CARDIOVASCULAR EXAM:  Regular rate and rhythm with normal S1 and S2.  The  PMI was laterally displaced and enlarged.  There is a soft S4.  ABDOMEN EXAM:  Soft, nontender, nontender, there is no organomegaly, bowel  sounds  are present, and there is no rebound or guarding.  EXTREMITIES:  No cyanosis, clubbing or edema.  The pulses were 2+ and  symmetric.  NEUROLOGIC EXAM:  Alert and oriented x3 with cranial nerves intact.  The  strength was 5/5 and symmetric.   The EKG demonstrates atrial pacing with intermittent ventricular pacing.  Review of her defibrillator strips is documented previously.   IMPRESSION:  1.  Dilated cardiomyopathy.  2.  Multiple implantable cardioverter defibrillator shocks secondary to      atrial fibrillation, which has been asymptomatic and nonsustained      ventricular tachycardia.  3.  Congestive heart failure.  4.  Peripheral vascular disease.  5.  Coronary disease.   DISCUSSION:  At this point, I would recommend continuing her amiodarone and  beta blockers.  These can be switched to p.o. in the next 24 hours or so.  Also, would recommend increasing the detection intervals on  the  defibrillator to avoid any shocks for nonsustained VT.  At the young age, if  these therapies and changes do not work, then consider increasing the VT  zone from 180 to 190.  With regard to her atrial fibrillation, I will defer  these issues to her primary cardiology team.  Long-term beta blockers and  oral amiodarone would be reasonable to prevent from receiving additional ICD  shocks.           ______________________________  Doylene Canning. Ladona Ridgel, M.D.     GWT/MEDQ  D:  04/21/2005  T:  04/21/2005  Job:  [id number]   cc:   Nanetta Batty, M.D.  Fax: [phone]

## 2010-08-08 NOTE — Consult Note (Signed)
NAME:  [name], [name]                       ACCOUNT NO.:  [id number]   MEDICAL RECORD NO.:  [id number]                   PATIENT TYPE:  INP   LOCATION:  1832                                 FACILITY:  [facility]   PHYSICIAN:  Doylene Canning. Ladona Ridgel, M.D. [facility]           DATE OF BIRTH:  1945/06/08   DATE OF CONSULTATION:  01/11/2002  DATE OF DISCHARGE:                                   CONSULTATION   INDICATION FOR CONSULTATION:  Evaluation of syncope in the setting of  ischemic cardiomyopathy.   HISTORY OF PRESENT ILLNESS:  The patient is a very pleasant 65 year old  woman with a history of coronary artery disease, status post inferior MI  approximately seven years ago and history of stenting to the LAD four years  ago.  The patient was most recently admitted back in December 2001.  This  was with chest discomfort.  With regard to her chest discomfort, she has  done quite well over the last year or so with minimal episodes of chest pain  and no shortness of breath.  The patient has an ejection fraction of 35%.  The patient was in her usual state of health until last night when she was  cooking dinner and at the sink she suddenly without any warning became dizzy  for just a few seconds and then passed out, falling backwards and hitting  her head.  The episode was witnessed by her son, who reportedly told the  patient that she was initially not breathing and had a very weak pulse.  By  the time the paramedics arrived and the patient began to awaken, she felt  weak and somewhat diaphoretic, which eventually resolved.  Admission to the  hospital so far has demonstrated only ectopy on the monitor with no  sustained ventricular tachycardia on telemetry.  Her initial cardiac enzymes  did not show any evidence of acute myocardial infarction.  The patient  denied any previous history of syncope or near syncope.  The patient denies  any congestive heart failure symptoms.   PAST MEDICAL HISTORY:  As  previously noted.  In addition, she has diabetes  and a history of frequent ventricular ectopy with an EP study back in July  1997 which demonstrate no inducible VT.  She was on amiodarone initially for  her nonsustained symptomatic VT but this resulted in problems with thyroid  and noncompliance with the patient not showing up to clinic and this was  subsequently discontinued.  She has been maintained on beta-blockers and ACE  inhibitors.   FAMILY HISTORY:  Notable for coronary artery disease.   SOCIAL HISTORY:  The patient presently denies tobacco or ethanol abuse.   REVIEW OF SYSTEMS:  Notable for no vision or hearing problems.  She does  have chronic right shoulder and arm discomfort and left leg and hip  discomfort.  She denies any vision or hearing problems.  She denies any  swallowing difficulties.  She denies any nausea, vomiting, diarrhea, or  constipation except as previously noted.  She denies any hemoptysis or  wheezing problems.  She denies any dyspnea.  She denies any arthritic  complaints except as previously mentioned.  She denies any neurologic  complaints.   PHYSICAL EXAMINATION:  GENERAL:  She is a pleasant, well-appearing middle-  aged woman in no distress.  VITAL SIGNS:  Blood pressure 150/90, pulse 78 and regular, respirations 18.  HEENT:  Normocephalic and atraumatic.  Her pupils were equal and round.  The  oropharynx was moist.  The sclerae were anicteric.  NECK:  No jugular venous distention.  The carotids were 2+ and symmetric.  The trachea was midline.  There was no thyromegaly appreciated.  CARDIOVASCULAR:  Regular rate and rhythm with a soft systolic murmur at the  right sternal border.  There was a positive S4 gallop.  LUNGS:  Clear without wheezes, rales, or rhonchi.  ABDOMEN:  Soft, nontender, nondistended.  There was no rebound or guarding.  EXTREMITIES:  No cyanosis, clubbing, or edema.  Pulses were 2+ and  symmetric.   LABORATORY DATA:  EKG  demonstrates a normal sinus rhythm with a prior  lateral MI and voltage criteria for LVH.   Potassium 2.9.   IMPRESSION:  1. Syncope with sudden onset and features both consistent with an arrhythmia     versus a nonarrhythmic cause.  2. Ischemic cardiomyopathy with an ejection fraction of 35% (108 msec) QRS.  3. History of myocardial infarction with multiple coronary interventions.  4. History of nonsustained ventricular tachycardia with negative     electrophysiology study six years ago.   DISCUSSION:  The patient's symptoms are concerning for an arrhythmic-cause  syncope and the patient has certainly multiple risk factors for this with a  prior myocardial infarction and left ventricular dysfunction.  I recommended  proceeding with a 2-D echocardiogram to determine what her left ventricular  systolic function is.  If it has remained relatively stable, then  electrophysiology study would be warranted.  If she has a marked reduction  in her left ventricular function, then empiric implantable  cardioverter/defibrillator perhaps after catheterization would also be a  consideration.                                               Doylene Canning. Ladona Ridgel, M.D. [facility]    GWT/MEDQ  D:  01/11/2002  T:  01/11/2002  Job:  [id number]   cc:   Deniece Portela C. Dorna Bloom, M.D.

## 2010-08-08 NOTE — Op Note (Signed)
NAMESEBASTIANA, [name]             ACCOUNT NO.:  [id number]   MEDICAL RECORD NO.:  [id number]          PATIENT TYPE:  INP   LOCATION:  6522                         FACILITY:  [facility]   PHYSICIAN:  Richard A. Alanda Amass, M.D.DATE OF BIRTH:  Apr 15, 1945   DATE OF PROCEDURE:  04/08/2005  DATE OF DISCHARGE:                                 OPERATIVE REPORT   PROCEDURE:  Implantation of dual-chamber ICD St. Jude's Atlas 2, Plus VR,  model number [id number]; [id number] with passive fixation, St. Jude's steroid  eluding, dual coil rate sense, defibrillator RV electrode and bipolar  steroid eluding passive fixation, atrial electrode, DFT testing by  verification with VF induction.   IMPLANTING PHYSICIAN:  Richard A. Alanda Amass, M.D.   COMPLICATIONS:  None.   ESTIMATED BLOOD LOSS:  Approximately 40 mL.   ANESTHESIA:  1% local Xylocaine, fentanyl 50 mcg IV in divided doses, Versed  5 mg IV in divided doses.   PREOPERATIVE ANTIBIOTIC PROPHYLAXIS:  Ancef 1 gram IV to be continued  postoperatively.   VENTRICULAR ELECTRODE:  60 cm, St. Jude model [id number]; [id number], dual coil  steroid eluding, short separation 17 cm coil spacing.   ATRIAL ELECTRODE:  [employer]. [employer] 1642 T-46 cm; ON:[id number].   Generator voltage at beginning of life (BOL) equal to 3.2 volts; at ERI  voltage drops to less than 2.45 volts.   PREOPERATIVE DIAGNOSES:  1.  Remote myocardial infarction with ischemic heart disease.  2.  Prior bare metal stenting proximal and mid-LAD 2000, patent on recent      study April 06, 2005 Dr. Garnette Scheuermann.  3.  Triple tandem DES Cypher stents, mid-RCA January 2006 Dr. Elsie Lincoln, Ticlid      therapy for PLAVIX ALLERGY.  4.  InStent restenosis on recatheterization April 06, 2005 with cutting      balloon atherectomy and sandwich DES stenting Dr. Allyson Sabal.  5.  Severe LV dysfunction with EF 20-25% confirmed on angiography and 2-D      echo, chronic.  6.  Hyperlipidemia.  7.  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  8.  Adult-onset diabetes mellitus (AODM).  9.  Sick sinus syndrome, paroxysmal AF with rapid VR.  10. Nonsustained ventricular tachycardia (NSVT) nonsmoker.   POSTOPERATIVE DIAGNOSIS:  1.  Remote myocardial infarction with ischemic heart disease.  2.  Prior bare metal stenting proximal and mid-LAD 2000, patent on recent      study April 06, 2005 Dr. Garnette Scheuermann.  3.  Triple tandem DES Cypher stents, mid-RCA January 2006 Dr. Elsie Lincoln, Ticlid      therapy for PLAVIX ALLERGY.  4.  InStent restenosis on recatheterization April 06, 2005 with cutting      balloon atherectomy and sandwich DES stenting Dr. Allyson Sabal.  5.  Severe LV dysfunction with EF 20-25% confirmed on angiography and 2-D      echo, chronic.  6.  Hyperlipidemia.  7.  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  8.  Adult-onset diabetes mellitus (AODM).  9.  Sick sinus syndrome, paroxysmal AF with  rapid VR.  10. Nonsustained ventricular tachycardia (NSVT) nonsmoker.   DESCRIPTION OF PROCEDURE:  [name] is a 65 year old divorced mother of  three with four grandchildren. She lives with a friend in [place]. She is  a nonsmoker; has ischemic heart disease with chronic low EF, class III  symptoms; and SVT and paroxysmal AF. She has had prior coronary  interventions as outlined above with recent sandwich DES stenting of ISR for  a course of RCA. Because of PLAVIX ALLERGY with rash, she was on Ticlid. She  was also on full-dose aspirin. It was felt that she was a high risk patient  and ICD was indicated for primary prevention. Since she would need to be on  Ticlid for least 3-6 months, it was felt that the risk/benefit ratio, as far  as complications of bleeding were concerned, favor proceeding with ICD  implantation. Informed consent was obtained from the patient and her son as  outlined in the chart.   The RV threshold:  R  equals 11.5 MV, threshold 0.5 V at 0.5 milliseconds,  resistance 10 44 ohms. RA: P equals 6.8 millivolts with APCs measured,  threshold 0.7 volts at 0.5 milliseconds, resistance 577 ohms.   No significant difference through device measurement post preoperatively.   DESCRIPTION OF PROCEDURE:  The patient was brought to the second floor CP  lab room #5 in the post absorptive state, premedicated with 5 mg Valium p.o.  Aspirin and Ticlid were continued. She had been off 2B 3A inhibitors for 24  hours. The left anterior chest was prepped, and draped in the usual manner.  1% Xylocaine was used for local anesthesia. The patient was given  intermittent fentanyl and Versed for sedation and VF induction at the end of  the procedure. A left infraclavicular curvilinear transverse incision was  performed and brought down to the prepectoral fascia using blunt dissection  and electrocautery to control hemostasis. A pulse generator pocket was  formed using blunt dissection and careful electrocautery to control  hemostasis. The subclavian vein was in the extrathoracic portion, was  entered under fluoroscopic control using modified Seldinger technique with  an 18 thin-wall needle; and anterior puncture. Using two #9 peel-away Cook  introducers with tandem guidewire exchange technique, the RV and RA  electrodes were introduced in tandem, the sheath peeled away, and a  retaining guidewire was kept. RV electrode was positioned in the RV apex and  was stable. The right atrial electrode was positioned in the right atrial  appendage; and was stable, and confirmed by rotational maneuvers, and  fluoroscopy.   Both electrodes were stable. Threshold testing was performed. It should be  noted that because of her short stature a 17-cm coil separation and the dual  coil RV electrode was used. The coil was at the SVC, left subclavian junction. The ventricular electrode appeared stable on respiratory  maneuvers. The  electrodes were secured at the insertion site where the  previously placed #1 figure-of-eight silk suture around a tissue sewing  collar to prevent migration. They were further secured with to interrupted  #1 silk sutures around a silicone sewing collar. The pocket was irrigated  with 500 mg of kanamycin solution. The generator was hooked to the  electrodes in the proper sequence using identifying each lead, and using  serial numbers, and the hex nuts tightened. Prior to this, the pocket was  irrigated with 500 mg of kanamycin solution and __________ was used,  prophylactically, in the pocket because of the patient's  dual antiplatelet  therapy. Generator was delivered through the pocket with the electrodes  looped behind, and loosely secured to the underlying muscle fascia with #1  silk suture to prevent migration. Subcutaneous tissue was closed with two  separate running layers of Vicryl suture; and the skin was closed with 4-0  subcuticular Vicryl suture.   DFT testing was attempted with R on T shock and a pacing train of 400  milliseconds coupling interval of 310 milliseconds and a 1 joule shock. The  patient had nonsustained VT with each of these episodes.   We then used the DC Fibber method to induce VF (30 milliseconds bursts, 10  volts, DC shock) and VF was induced at a rate of 330 (180 milliseconds). The  patient was successfully rescued with a single 20 joules shock to sinus  rhythm. Shocking impedance 42 ohms, duration of the episode 4.0 seconds, and  fully sensed with no dropped laps. Shocking vector was RV:SVC/CAN. Two  external biphasic cardioverted devices were used in back up for rescue, but  were not necessary. Respiratory therapy was in attendance with sedation for  VF induction. Verification DFT testing was done as outlined above; this is a  high output generator at 35 joules delivered. Fluoroscopy showed good  position of all the electrodes, interrogation through the  device, showed  excellent parameters and continued good thresholds. The patient was  transferred to the holding area for postoperative care in stable condition;  normal, alert neurologic status, and stable hemodynamically.      Richard A. Alanda Amass, M.D.  Electronically Signed     RAW/MEDQ  D:  04/08/2005  T:  04/08/2005  Job:  [id number]   cc:   [name], M.D.  Fax: [phone]   CP Lab   [facility] -- [facility]

## 2010-08-08 NOTE — H&P (Signed)
[place]  Patient:    [name], [name]                    MRN: [id number] Adm. Date:  [id number] Attending:  Molpus, John L Dictator:   Anselm Lis, N.P. CC:         Wayne C. Dorna Bloom, M.D.   History and Physical  DATE OF BIRTH:  [birth year]  HISTORY OF PRESENT ILLNESS:  Ms. Colquhoun is a pleasant 65 year old diabetic female with a history of hypertension, who is now 12 months status post stent of the proximal left anterior descending coronary artery.  A prior stent placed to the mid-LAD two years earlier was found to be patent.  She has a history of an inferior myocardial infarction and a subsequent PTCA of the RCA in March 1995.  The patient had a stress Cardiolyte in July of this year, revealing old inferior myocardial scar and negative ischemia.  Her ejection fraction at the time of the heart catheterization in January of this year, was 40%.  Earlier this year the patient had complained of emotionally-induced chest pain.  She was seen in our clinic in August 2001, where an electrocardiogram was without significant changes from baseline.  She had been essentially free of _______ since, until this morning when at approximately 7 a.m. while at work, she developed epigastric/lower substernal "gnawing tightness," which was constant, rated as a 3/10 on a pain scale.  No associated shortness of breath, diaphoresis, or nausea.  She had a slight posterior neck and shoulder discomfort.  The discomfort discontinued until she presented to the emergency room and received one sublingual nitroglycerin, with relief.  The electrocardiogram was without change from her baseline abnormalities.  She did not think to bring the nitroglycerin with her to work, because she had been pain-free for so long.  PAST MEDICAL HISTORY: 1. Coronary atherosclerotic heart disease:    a. In July 2001, a stress Cardiolyte which was negative for ischemia.       Old inferior  myocardial infarction.    b. In January 2001, a stent in the proximal LAD.  The ejection fraction       was 40%.  The diagonal was placed "stent jail."  Prior stent that       had been placed in the mid-LAD was patent.  Mild to moderate       circumflex disease (50% proximal), and RCA disease (60%-70% distal, 50%       mid).  Ejection fraction approximately 35%, with global hypokinesis.    c. In January 2000, a stent to the mid-LAD.    d. In June 1998, a diagnostic cardiac catheterization.    e. In 1997, chest pain, diagnostic cardiac catheterization revealing       patent RCA stent.    f. In May 1995, inferior myocardial infarction with subsequent PTCA       of the RCA. 2. Ischemic dilated cardiomyopathy, ejection fraction of approximately    35% by heart catheterization in January of this year. 3. Hypertension x 30 years. 4. History of and continuing high-grade ventricular ectopy, initially    on amiodarone in July 1997, which was subsequently discontinued,    secondary to the patients inability to pay for the high cost of the    drug, and for her lack of followup, with monitoring of TSH, liver function    tests, and PFTs.  She had had an EPS consultation by Dr.  Nathen May with the study revealing noninducible ventricular tachycardia. 5. Diabetes mellitus diagnosed in 2000, type 2. 6. Bicuspid aortic valve with aortic regurgitation. 7. Irritable bowel syndrome, normal esophagogastroduodenoscopy and    normal colonoscopy by Dr. Charolett Bumpers III in 1995. 8. Dyslipidemia. 9. Allergic rhinitis.  PAST SURGICAL HISTORY: 1. Cholecystectomy. 2. Appendectomy without BSO.  CURRENT MEDICATIONS: 1. Enteric-coated aspirin 325 mg one q.d. 2. Glucophage 1 g p.o. b.i.d. 3. Imdur 60 mg p.o. q.d. 4. Accupril 40 mg p.o. q.d. 5. Norvasc 5 mg p.o. q.d. 6. Lasix 40 mg p.o. q.d. 7. Sublingual nitroglycerin 0.4 mg p.r.n. chest discomfort.  ALLERGIES:  MEXILETINE causing headache  and dizziness.  PLAVIX causing a rash. DARVON causing exacerbation of GERD.  LIPITOR causing constipation.  She is okay with sea food, shell fish, and iodinated products.  SOCIAL HISTORY:  The patient is divorced.  She has a daughter and two sons who live in [place].  Two sons unfortunately are addicted to crack cocaine. She has a lot of ongoing emotional distress concerning this situation.  FAMILY HISTORY:  She has a brother age 12, who had a myocardial infarction  at age 81.  Another brother died of a heart attack at age 75.  She has three sisters with hypertension.  Her mother deceased at age 57 with multiple medical problems, including diabetes mellitus, myocardial infarction, hypertension, and stroke.  Her father died in his 7s or 73s of a myocardial infarction.  REVIEW OF SYSTEMS:  As in the HPI and the past medical history.  Otherwise denies problems with syncope or near syncopal episodes.  Has rare episodic dysphagia with certain foods.  Negative for melena or bright red blood per rectum.  Occasional episodes of diarrhea.  Denies dysuria, no hematuria. Arthritic-type complaints effecting her fingers.  She does have chronic left lower extremity pedal edema.  PHYSICAL EXAMINATION:  (As performed by Dr. Darci Needle III):  VITAL SIGNS:  Blood pressure 161/87, heart rate 74 and regular, respirations 20, temperature 98.2 degrees.  O2 saturation 98% on room air.  GENERAL:  She is a well-nourished, middle-aged female, in no acute distress.  HEENT/NECK:  Brisk bilateral carotid upstroke without bruits.  No significant jugular venous distention or thyromegaly.  CHEST:  Lung sounds clear with equal bilateral excursion.  Negative CPA tenderness.  CARDIAC:  A regular rate and rhythm.  Occasional premature beats.  S1, S2. Systolic murmur, loudest at the apex.  ABDOMEN:  Soft, nondistended.  Normoactive bowel sounds.  Negative abdominal aortic, renal, or femoral bruits.   Nontender to applied pressure.  No masses or organomegaly.   EXTREMITIES:  Distal pulses intact.  Negative pedal edema.  NEUROLOGIC:  Alert and oriented x 3.  Neurological examination nonfocal.  GENITOURINARY:  Deferred.  RECTAL:  Deferred.  LABORATORY DATA:  Electrocardiogram reveals normal sinus rhythm at 80 beats per minute with frequent PVCs and bigeminy.  This was without significant change from her baseline of January 2001, as well as August 2001.  The electrocardiogram with her usual slight anterior coving, T-wave abnormalities in V3 through V6, and "old" inferior lateral myocardial infarction.  T-wave inversion inferiorly.  Sodium 137, K of 3.2, chloride 102, CO2 of 25, BUN 20, creatinine 0.6, glucose 361.  Hemoglobin 14.9, hematocrit 41.6, WBC 5.6, and platelets of 240.  Liver function tests were within normal limits.  Total bilirubin elevated at 1.9. CPK 63, MB fraction of 0.9, troponin I 0.01.  Urinalysis was positive for  glucose.  IMPRESSION:  (As dictated by Dr. Verdis Prime): 1. Chest discomfort induced by emotional distress, readily relieved with    one sublingual nitroglycerin here in the emergency room, in this    65 year old female with a known history of coronary artery disease.    She is status post a stent to her proximal left anterior descending    coronary artery in January 2001.  A follow-up chest Cardiolyte in    August 2001, was negative for ischemia.  An old inferior scar.  The    patient had forgotten to bring her nitroglycerin tablets with her to    work, where she had an occurrence of this discomfort.  She is currently    pain-free.  The electrocardiogram is without changes from baseline.    Her first set of cardiac enzymes are negative. 2. Hypokalemia, serum potassium of 3.2 here in the emergency room.  She    was supplemented with 40 mEq of K-Dur. 3. Known ischemic dilated cardiomyopathy with an ejection fraction of    approximately 35% by a heart  catheterization in January of this year.    She is being treated with an ACE inhibitor, aspirin, Imdur, and a    diuretic. 4. History of hypertension.  Her blood pressure is elevated upon her    presentation to the emergency room. 5. Emotional distress over poor family dynamics, sons with drug abuse.  PLAN: (As dictated by Dr. Verdis Prime): 1. The patient is discharged home pain-free, and in stable condition. 2. I renewed her prescriptions for Accupril and Imdur (one months supply    with 11 refills provided).  Also provided her with a prescription for    sublingual nitroglycerin spray. 3. Will follow up with Dr. Verdis Prime in the clinic on March 26, 2000,    at noon. 4. The patient provided with a phone number for [place] [phone]),    towards joining a support group in her dealing with her sons drug    abuse. 5. The patient was given 40 mEq of K-Dur for supplementation of the    potassium of 3.2. DD:  03/05/00 TD:  03/05/00 Job: [id number]

## 2010-08-08 NOTE — Cardiovascular Report (Signed)
[name], [name]             ACCOUNT NO.:  [id number]   MEDICAL RECORD NO.:  [id number]          [name] TYPE:  INP   LOCATION:  6524                         FACILITY:  [facility]   PHYSICIAN:  [name], M.D.DATE OF BIRTH:  05/15/1945   DATE OF PROCEDURE:  04/07/2004  DATE OF DISCHARGE:                              CARDIAC CATHETERIZATION   PROCEDURES PERFORMED:  1.  Selective coronary angiography by Judkins technique.  2.  Retrograde left heart catheterization.  3.  Left ventricular angiography.  4.  Abdominal aortography.  5.  Percutaneous transluminal coronary artery stenting of [name] proximal, mid,      and distal right coronary artery.   ENTRY SITE:  Right femoral.   DYE USED:  Omnipaque   <MEDICATIONS GIVEN/>  Integrilin, heparin, and fentanyl on several occasions for sedation and back  pain.   COMPLICATIONS:  None.   [name] PROFILE:  [name] is a 65 year old Native-American woman who has  a history of ischemic cardiomyopathy dating back to [name] late 1990s.  At that  time [name] was said to have had a myocardial infarction and left ventricular  ejection fraction of approximately 25-35% and had undergone intracoronary  artery stenting in [name] year 2000 by Dr. Verdis Prime with both proximal and  mid LAD stents placed.   [name]'s current hospitalization on April 03, 2004, was for a Tylenol  overdose.  Because of [name] going into atrial fibrillation and [name]  history of known low EF, cardiology consult was obtained,  and, because of  [name] suspicion that ischemia may have driven [name] atrial fibrillation, [name] was  brought to [name] catheterization lab for this current repeat catheterization.  It was performed via right percutaneous femoral approach which included a  right and left heart catheterization without any complications.   RESULTS:   PRESSURES:  [name] left ventricular pressure was 100/50 and end-diastolic  pressure 18. Central aortic pressure  150/65, mean of 95.  No aortic valve  gradient by pullback technique. Right atrial mean pressure 8, right  ventricular pressure 30/4, end-diastolic pressure 10. Pulmonary artery  pressure 30/15, mean of 23. Pulmonary capillary wedge mean pressure 13.  Cardiac output by Fick 2.4, index 1.4, thermodilution cardiac output 3.2,  cardiac index 1.8.   ANGIOGRAPHIC RESULTS:  [name]'s left main coronary artery was normal.  [name] left anterior descending coronary artery coursed to [name] cardiac apex and  gave rise to two diagonal branches, one before [name] septal and one after [name]  septal perforator branch. No lesions were seen in [name] LAD including a radio-  opaque patent stent proximally and a radio-opaque mid LAD stent that was  also patent.   [name] eyes ostium of [name] diagonal showed a 90% stenosis in [name] ostium of [name]  vessel, but it was too small to consider for intervention; and a second  diagonal branch free of any significant lesions.   [name] left circumflex coronary artery was normal and bifurcated distally.   [name] right coronary artery was somewhat small in appearance and contained  stenoses throughout [name] first two-thirds of [name] vessel including [name] 95%  stenosis proximally, a 75% lesion down near [name] acute angle [name] heart, and  then 75% stenosis at [name] junction between mid and distal RCA. LV angiography  showed global hypokinesis with ejection fraction 20-30%. No mitral  regurgitation seen. No LV thrombus.  Abdominal aorta showed a an ectatic  aorta just above [name] aortic bifurcation, normal renal arteries, and normal  common iliacs.   Percutaneous intervention was performed from [name] right femoral approach  using [name] 6-French catheter system and a 6-French Cordis right four guide  catheter. [name] guide wire used was a Oceanographer.  Predilatation of [name] mid  RCA was performed when direct stenting was attempted but unable to  successfully cross [name] Maverick 2.0 x 10 or 12.  A Maverick balloon  was used  to predilate.  Then I was able to pass a 2.0 x 23 Cypher stent.  This stent  could not be passed into [name] distal vessel, so I deployed it proximally, and  then I was able to balloon [name] area distal to [name] first placed stent and  create an opening for a subsequent 2.0 x 28 mm Cypher stent.  I then post  dilated with a Quantum Maverick balloon 2.75 which was used to dilate [name]  distal end of [name] overlapped stent which now approached 50 mm, and I will  also ballooned [name] mid portion of [name] overlap site with [name] 2.75 Quantum  Maverick balloon.   During [name] procedure, Integrilin was given and heparin with an ACT in [name]  high 200s.  No complications occurred. No ischemia or chest pain was  reported by [name]. There were no rhythm abnormalities other than sinus  bradycardia.   FINAL DIAGNOSES:  1.  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 75% mid, 75% distal      successfully stented today.  2.  Patent left anterior descending artery stents mid and proximally mid      patent from previous procedure in 2000.  3.  Ischemic cardiomyopathy, chronic ejection fraction now 20 to 30%.  4.  History of nonsustained ventricular tachycardia.  5.  Hypertension.  6.  Diabetes mellitus.   PLAN:  [name] will be restarted on digoxin, ACE inhibitor, beta  blockade and will be on aspirin and Plavix for 6 months.  At [name] current  time, it is not recommended that [name] be placed on Coumadin, especially in  view of being on aspirin and Plavix, but if atrial fibrillation were to  return, [name] would then be a candidate. [name] will be seen for  EP repeat consultation, and if in 3 months [name] has an ejection fraction 30  or lower, [name] would qualify for an implantable defibrillator      WHG/MEDQ  D:  04/07/2004  T:  04/07/2004  Job:  [id number]   cc:   Corinna L. Lendell Caprice, MD   [facility]

## 2010-08-08 NOTE — Consult Note (Signed)
[name], [name]             ACCOUNT NO.:  [id number]   MEDICAL RECORD NO.:  [id number]          PATIENT TYPE:  INP   LOCATION:  4733                         FACILITY:  [facility]   PHYSICIAN:  Doylene Canning. Ladona Ridgel, M.D.  DATE OF BIRTH:  [birth year]   DATE OF CONSULTATION:  06/11/2005  DATE OF DISCHARGE:                                   CONSULTATION   The consultation is requested by Dr. Susa Griffins.   INDICATION FOR CONSULTATION:  Recurrent ICD discharges.  The patient  complained that I was shocked by my defibrillator.   HISTORY OF PRESENT ILLNESS:  The patient is a 65 year old woman with a  history of recurrent tachy palpitations and documented ventricular  tachycardia who is status post ICD insertion secondary to her known ischemic  cardiomyopathy.  She post procedure developed rapid A-fib and received  several inappropriate ICD discharges and was initially placed on amiodarone  with up titration of her beta blocker.  Unfortunately, she developed nausea  on amiodarone resulting in marked decrease in her amiodarone dose.  She was  in her usual state of health until this morning when she was awakened by a  funny feeling and was subsequently shocked.  Subsequent interrogation of the  device demonstrates sustained monomorphic VT at a rate of 240 beats per  minute.  She was admitted for additional evaluation.  She denies chest pain.  She has chronic congestive heart failure symptoms, typically class 2.   PAST MEDICAL HISTORY:  Notable for hypertension, diabetes, and dyslipidemia.   FAMILY HISTORY:  Notable for a brother with coronary artery disease with  both brother and father dying of MIs and another brother with a heart  transplant.   SOCIAL HISTORY:  The patient is divorced.  She lives with her son.  She  denies any tobacco or alcohol use and is currently disabled.   ALLERGIES:  INCLUDE A DOCUMENTED ALLERGY TO PLAVIX.   REVIEW OF SYSTEMS:  Notable for class 2 to 3 heart  failure symptoms  including dyspnea, PND and orthopnea and lower extremity swelling.  She  denies cough or hemoptysis, claudication.  No nausea, vomiting, diarrhea,  constipation except as previously noted.  She denies arthritic complaints,  polyuria, polydipsia, heat or cold intolerance.  Recent skin or weight  changes.  She denies any neurologic problems or easy bruisability and other  hematologic problems.   PHYSICAL EXAMINATION:  She is a pleasant, well-appearing, middle-aged woman  in no acute distress.  Blood pressure today was 150/70, the pulse was 60 and  regular, respirations are 18, temperature is 98.  Oxygen saturation was 98%.  HEENT:  Normocephalic, atraumatic.  Pupils equal and round.  Oropharynx is  moist.  Sclerae anicteric.  NECK:  Revealed no jugular venous distention.  There is no thyromegaly.  Trachea is midline.  Carotids are 2+ and symmetric.  LUNGS:  Clear bilaterally to auscultation.  There are no wheezes, rales or  rhonchi.  CARDIOVASCULAR EXAM:  Regular rate and rhythm with normal S1 S2 and no  murmurs, rubs or gallops present.  There is an S4 gallop, however.  EXTREMITIES:  Demonstrated no cyanosis, clubbing or edema.  ABDOMINAL EXAM:  Soft, nontender, nondistended.  There is no organomegaly.  Bowel sounds are present.  There is no rebound or guarding noted.  NEUROLOGIC EXAM:  Alert and oriented times three with cranial nerves intact.  Strength is 5/5 and symmetric.   IMPRESSION:  1.  Ventricular tachycardia.  2.  Nonischemic cardiomyopathy.  3.  Congestive heart failure.  4.  Paroxysmal atrial fibrillation.  5.  Hypertension.   DISCUSSION:  I have reviewed the issues of treatment with the patient.  I  recommend that we again reprogram her device so that we allow her some anti-  tachycardic pacing even at the high rates.  Will increase her VT number two  zone to help try to terminate her rapid VT.  Secondly, will plan to switch  her from amiodarone which  causes her nausea to sotalol and down titrate her  other beta blocker as we up titrate her sotalol.  She will need to be  hospitalized for several days.           ______________________________  Doylene Canning. Ladona Ridgel, M.D.     GWT/MEDQ  D:  06/11/2005  T:  06/13/2005  Job:  [id number]   cc:   [name], M.D.  Fax: [phone]

## 2010-08-08 NOTE — H&P (Signed)
NAME:  [name], GENTZLER                       ACCOUNT NO.:  [id number]   MEDICAL RECORD NO.:  [id number]                   PATIENT TYPE:  EMS   LOCATION:  MAJO                                 FACILITY:  [facility]   PHYSICIAN:  Colleen Can. [name], M.D.            DATE OF BIRTH:  1945/04/20   DATE OF ADMISSION:  01/10/2002  DATE OF DISCHARGE:                                HISTORY & PHYSICAL   CHIEF COMPLAINT:  Syncope.   HISTORY OF PRESENT ILLNESS:  The patient is a 65 year old female admitted  with a feeling of lightheadedness about 3 p.m. today.  She was cooking, she  had frank syncope. Her son called 911. She regained consciousness upon their  arrival. She has had no palpitations, although she will have some PVCs on  occasion. She had no chest pain.  Previously, she had been doing reasonably  well without problems.   PAST MEDICAL HISTORY:  1. Dilated ischemic cardiomyopathy.  2. 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 Last cath in 1/02.  3. Diabetes.  4. Ventricular ectopy.  No inducible ventricular tachycardia on 7/97,     previously on amiodarone, discontinued due to cough.  5. Bicuspid aortic valve with some regurgitation.  6. Hypertension.  7. Irritable bowel syndrome.  8. Dyslipidemia.   MEDICATIONS:  1. Toprol 50 mg daily.  2. Lasix 20 mg a day.  3. Off Imdur and Accupril.  4. Aspirin.  5. Glucophage 2000 mg per day total.  6. Norvasc 10 mg a day.   ALLERGIES:  Plavix causes rash, Darvon.   FAMILY HISTORY:  Brother died at age 25 of MI, another brother died MI at  age 78. She has 3 sisters with hypertension. Mother died at age 39, had  multiple medical problems including diabetes mellitus, myocardial  infarction, hypertension, stroke.  Her father died in his 78-50s of  myocardial infarction.   SOCIAL HISTORY:   She quit smoking in 1997, prior 25 packs' year. No  caffeine, no alcohol.  She works at [employer]. She is divorced, she has  3 children, alive and well.   REVIEW OF SYSTEMS:  Otherwise unremarkable. There is no melena or bright red  blood per rectum.   PHYSICAL EXAMINATION:  VITAL SIGNS:  Blood pressure 151/91 sitting. There  are no orthostatic changes. Heart rate 70, respiratory rate 16.  SKIN:  Warm and dry.  LUNGS:  Reasonably clear.  CARDIOVASCULAR:  Soft systolic murmur and frequent ectopics. There is a  faint murmur of aortic insufficiency.  ABDOMEN:  Soft, nontender.  EXTREMITIES: Without edema.  NEUROLOGIC:  Intact.   LABORATORY DATA:  Potassium 2.9, sodium 141, glucose 195, Bun and creatinine  are normal. Troponins are all negative initially. Hematocrit is 37,  platelets 249,000, white count 7000. EKG  shows inferior Q waves, poor R wave  progression, sinus rhythm is noted.   OVERALL IMPRESSION:  1. Syncope, questionable etiology with ischemic cardiomyopathy and known     previous disease.  2. History of high grade ventricular ectopy with non-inducible ventricular     tachycardia in 1997.  3. Atherosclerotic cardiovascular disease.  4. Bicuspid aortic valve with history of mild aortic insufficiency.  5. Hypertension.  6. Non-insulin-dependent diabetes mellitus.  7. Dyslipidemia.   PLAN:  Will admit, will monitor on telemetry, check labs, probably repeat EP  study, continue medicines, will replace potassium. Potassium may be the  etiology of her arrhythmia.                                                 Colleen Can. [name], M.D.    SNT/MEDQ  D:  01/10/2002  T:  01/10/2002  Job:  [id number]   cc:   Lyn Records III, M.D.  [address]  [address]  [place]  [place] [address]  Fax: [phone]   [name]. Dorna Bloom, M.D.

## 2010-08-08 NOTE — Discharge Summary (Signed)
[name], [name]             ACCOUNT NO.:  [id number]   MEDICAL RECORD NO.:  [id number]          PATIENT TYPE:  INP   LOCATION:  6529                         FACILITY:  [facility]   PHYSICIAN:  Elliot Cousin, M.D.    DATE OF BIRTH:  29-Dec-1945   DATE OF ADMISSION:  04/01/2005  DATE OF DISCHARGE:  04/10/2005                                 DISCHARGE SUMMARY   DISCHARGE DIAGNOSES:  1.  Syncope secondary to arrhythmia.  2.  Paroxysmal atrial fibrillation with rapid ventricular response and sick      sinus syndrome.  3.  Coronary artery disease with a high-grade in-stent restenosis of the      right coronary artery per cardiac catheterization on January15,2007, by      Dr. Allyson Sabal.  Status post percutaneous intervention and stent to the right      coronary artery.  4.  History of previous stent to the left anterior descending and three      stents to the right coronary artery per Dr.Gamble in January2006.  5.  Worsening ischemic cardiomyopathy with an ejection fraction of 15% per      cardiac catheterization on January15,2007, per Dr. Allyson Sabal.  6.  Status post dual-chamber implantable cardioverter-defibrillator      insertion per Dr. Alanda Amass on January17,2007.  7.  Bilateral renal artery stenosis, 30-40% stenosis of the right renal      artery and 95-99% stenosis of the left renal artery per catheterization      by Dr. Allyson Sabal.  Status post percutaneous transluminal angioplasty and      stent to the left renal artery per Dr. Allyson Sabal on January18,2007.  8.  Transient acute renal insufficiency.  9.  Hypertension.  10. Type 2 diabetes mellitus.  11. Hyperlipidemia.  12. Financial constraints.   SECONDARY DISCHARGE DIAGNOSES:  1.  Depression with a history of a suicide attempt using Tylenol in      January2006.  2.  History of nonsustained ventricular tachycardia.   ALLERGIES:  PLAVIX.   DISCHARGE MEDICATIONS:  1.  Aspirin 325 mg daily.  2.  Ticlid 250 mg b.i.d.  3.  Lanoxin 0.125 mg  daily.  4.  Furosemide 40 mg daily.  5.  Diltiazem 120 mg daily.  6.  Metoprolol 50 mg b.i.d.  7.  Enalapril 2.5 mg b.i.d.  8.  Prilosec 20 mg daily.  9.  Glipizide 5 mg b.i.d.  10. NPH insulin 10 units b.i.d.  11. Zocor 40 mg at bedtime.   DISCHARGE DISPOSITION:  The patient was discharged home in improved and  stable condition.  Arrangements will be made for her to follow up with Dr.  Orvan Falconer and Dr. Alanda Amass in  the next 1-2 weeks. The patient was given an  appointment for [facility] on January29,2007 at 2:30 p.m..   CONSULTATIONS:  Cardiologist at [facility] including  Dr. Tresa Endo, Dr. Allyson Sabal, Dr. Alanda Amass.   PROCEDURE PERFORMED:  1.  2-D echocardiogram on January11,2007.  Results revealed an ejection      fraction ranging between 25 and 35%.  Moderate diffuse left and  depression, moderate diffuse left ventricular hypokinesis, left      ventricular wall thickness was moderately to markedly increased, mild to      moderate aortic valve regurgitation and mild mitral valve regurgitation.  2.  Cardiac catheterization and PCI and stent to the right coronary artery      per Dr. Allyson Sabal on January15,2007  3.  Status post implantation of dual chamber ICD per Dr. Alanda Amass on      January17,2007  4.  Status post PTA and stent to the left renal artery per Dr. Allyson Sabal on      January18,2007   HISTORY OF PRESENT ILLNESS:  The patient is a 65 year old Native-American  lady with a past medical history significant for ischemic cardiomyopathy,  coronary artery disease, and diabetes mellitus who presented to the  emergency department on January10,2007 following a syncopal episode.  The  patient was apparently watching television on the night of admission when  all of a sudden she passed out without any warning.  She was transferred to  the emergency department at [facility].  When she arrived, she was  found to be in atrial fibrillation with rapid  ventricular response.  The  patient was therefore admitted for further evaluation and management.  For  additional details please see the dictated history and physical.   HOSPITAL COURSE:  PROBLEM #1 -  SYNCOPE/CORONARY ARTERY DISEASE/WORSENING  ISCHEMIC CARDIOMYOPATHY WITH AN EJECTION FRACTION OF 15%:[name]  On admission,  the patient was started on treatment with a Cardizem drip, metoprolol,  enalapril, Lipitor, IV lasix, and aspirin.  A heparin drip, full dose, was  also started.  At the time, Dr. Ellin Saba  examination, the patient was alert  and oriented.  There were no focal neurological findings.  Cardiac enzymes  were ordered x3.  The creatine kinase and CK-MB were all negative.  However,  the troponin I ranged between 0.15 and 0.50.  The patient's chest x-ray on  admission revealed mild pulmonary edema.  As stated above, Lasix at 40 mg IV  daily was initiated.  [facility] was consulted.  Dr. Tresa Endo  provided the initial consultation.  Per his assessment, the patient needed a  repeat cardiac catheterization to document coronary artery disease  progression.  He agreed with the management that was started, however, he  made a few changes in the medications.  The Cardizem drip was eventually  discontinued, and the patient was therefore started on Cardizem 120 mg  orally. The metoprolol was also titrated up to 50 mg b.i.d.  A  2-D  echocardiogram was ordered as well. The results of the 2-D echocardiogram  revealed an ejection fraction that ranged between 25 and 35%.  Digoxin at  0.125 mg daily was added for additional therapy.  The patient eventually  underwent a cardiac catheterization as performed by Dr. Allyson Sabal.  The cardiac  catheterization revealed a high-grade in-stent restenosis of the previous  stented RCA approximately one year ago.  Per Dr. Allyson Sabal, the patient  underwent percutaneous coronary intervention and stenting to that area of the RCA.  The patient tolerated the  procedure well.   The cardiology team discussed placement of AICD with the patient.  The  patient agreed. She subsequently underwent implantation of a dual-chamber  ICD by Dr. Alanda Amass on January17,2007.  She tolerated the procedure well.  The patient's cardiac medications were altered briefly at the time of  hospital discharge.  She was advised to take Ticlid in addition to aspirin  daily  following the stent placement.  She eventually converted to normal  sinus rhythm prior to hospital discharge.  The patient was advised to follow  up with Dr. Elsie Lincoln and Dr. Alanda Amass as scheduled by the [facility]  Cardiology team.   The patient had no further syncopal episodes during the hospital course.  More than likely, the patient's syncopal episode was secondary to either  rapid atrial fibrillation and/or nonsustained ventricular tachycardia.  Of  note, the patient's ejection fraction per the cardiac catheterization was  substantially reduced at 15%.  Given the severely reduced ejection fraction  and probable ventricular tachycardia, the ICD was placed.   PROBLEM #2 - BILATERAL RENAL ARTERY STENOSIS/TRANSIENT ACUTE RENAL  INSUFFICIENCY:  Per the  cardiac catheterization performed by Dr. Allyson Sabal, it  was noted that the patient had 30-40% stenosis of the right renal artery and  a 95-99% stenosis of the left renal artery.  The patient subsequently  underwent PTA and stent to the left renal artery per Dr. Allyson Sabal.  The artery  became patent with evidence 0% stenosis following the procedure.  During the  hospital course, the patient's BUN and creatinine increased.  Her BUN  reached a high of 30 and her creatinine reached a high of 1.4. However  following gentle IV fluids, the patient's BUN improved to 26 and her  creatinine improved to 1.2 prior to hospital discharge.   PROBLEM #3 - HYPERTENSION:  The patient's blood pressures were well  controlled during the hospital course on a combination of  diltiazem,  metoprolol, and enalapril.   PROBLEM #4 - TYPE 2 DIABETES MELLITUS:  The patient admits to being  noncompliant with diabetes therapy prior to hospital admission.  This was  secondary to financial constraints and difficulties with her insurance  carrier. The patient was started on treatment with Lantus and a sliding  scale insulin regimen initially.  Glipizide was started several days later  at 5 mg b.i.d. The patient received diabetes education during hospital  course, particularly with regards to self-administration of insulin.  She  demonstrated good understanding of insulin therapy and administration.  Her  capillary blood sugars ranged between 140 and 200 and during the last couple  of days of hospitalization. The patient's glycemia was difficult to control  given that she had to be n.p.o. during her multiple procedures during the  hospital course.  Because of the patient's financial constraints and restraints, Lantus was discontinued at the time of hospital discharge.  The  patient was therefore prescribed NPH insulin 10 units b.i.d. at home.  The  patient was also sent home on generic glipizide 5 mg b.i.d.  The nursing  staff and social work staff assisted with obtaining a  reduced cost of some  of the patient's medications at the [employer].  Most of the  medications that she is being discharged home on will cost only $4.00 per  prescription.  Obviously there are some that are significantly more costly  that the patient will have to pay much more for.  However, she will  be  given NPH insulin, glipizide, and a Glucometer at a reduced rate per the [employer]. The patient was advised to check her blood sugars at least  twice daily.  She was admonished to follow up with the [facility]  in one week to 10 days.   PROBLEM #5 - HYPERLIPIDEMIA:  The patient's fasting lipid panel revealed a  total cholesterol of 232, triglycerides of 165, HDL  cholesterol of 42 and  LDL cholesterol of 157.  The patient was started on Zocor 40 mg daily during  the hospital course.  She was prescribed Zocor at the time of hospital  discharge.  However, I doubt that the patient will be able to financially  afford it.   PROBLEM #6 - FINANCIAL RESTRAINTS:  As  stated above, the nursing staff and  social work staff assisted in obtaining reduced cost of some of the  patient's medications per the [employer].  The medications that will  cost the patient only $4.00 per prescription include Lanoxin, furosemide,  metoprolol, enalapril, glipizide, and possibly NPH.  The prescriptions not  covered by the reduced rate include Ticlid, and diltiazem, Prilosec, and  Zocor.   DISCHARGE LABORATORY DATA:  Sodium 136, potassium 3.3.  (The patient was  given 40 mEq of potassium chloride prior to hospital discharge.), chloride  100, CO2 23, glucose 173, BUN 26, creatinine 1.2, calcium 8.9.  WBC 8.0,  hemoglobin 12.0, hematocrit 34.5, platelets 182, 000.      Elliot Cousin, M.D.  Electronically Signed     DF/MEDQ  D:  04/10/2005  T:  04/11/2005  Job:  [id number]   cc:   Nanetta Batty, M.D.  Fax: [phone]   Richard A. Alanda Amass, M.D.  Fax: [phone]   [name], M.D.  Fax: [phone]

## 2011-01-13 ENCOUNTER — Emergency Department (HOSPITAL_COMMUNITY): Payer: Medicare Other

## 2011-01-13 ENCOUNTER — Inpatient Hospital Stay (HOSPITAL_COMMUNITY)
Admission: EM | Admit: 2011-01-13 | Discharge: 2011-01-17 | DRG: 308 | Disposition: A | Discharge status: Home / Self Care | Payer: Medicare Other | Attending: Internal Medicine | Admitting: Internal Medicine

## 2011-01-13 DIAGNOSIS — I4901 Ventricular fibrillation: Secondary | ICD-10-CM | POA: Diagnosis present

## 2011-01-13 DIAGNOSIS — I2510 Atherosclerotic heart disease of native coronary artery without angina pectoris: Secondary | ICD-10-CM | POA: Diagnosis present

## 2011-01-13 DIAGNOSIS — I5023 Acute on chronic systolic (congestive) heart failure: Secondary | ICD-10-CM | POA: Diagnosis not present

## 2011-01-13 DIAGNOSIS — Z79899 Other long term (current) drug therapy: Secondary | ICD-10-CM

## 2011-01-13 DIAGNOSIS — I472 Ventricular tachycardia, unspecified: Principal | ICD-10-CM | POA: Diagnosis present

## 2011-01-13 DIAGNOSIS — K3184 Gastroparesis: Secondary | ICD-10-CM | POA: Diagnosis present

## 2011-01-13 DIAGNOSIS — Z9861 Coronary angioplasty status: Secondary | ICD-10-CM

## 2011-01-13 DIAGNOSIS — Z23 Encounter for immunization: Secondary | ICD-10-CM

## 2011-01-13 DIAGNOSIS — I2589 Other forms of chronic ischemic heart disease: Secondary | ICD-10-CM | POA: Diagnosis present

## 2011-01-13 DIAGNOSIS — Z7982 Long term (current) use of aspirin: Secondary | ICD-10-CM

## 2011-01-13 DIAGNOSIS — I4729 Other ventricular tachycardia: Principal | ICD-10-CM | POA: Diagnosis present

## 2011-01-13 DIAGNOSIS — E1142 Type 2 diabetes mellitus with diabetic polyneuropathy: Secondary | ICD-10-CM | POA: Diagnosis present

## 2011-01-13 DIAGNOSIS — E1149 Type 2 diabetes mellitus with other diabetic neurological complication: Secondary | ICD-10-CM | POA: Diagnosis present

## 2011-01-13 DIAGNOSIS — E785 Hyperlipidemia, unspecified: Secondary | ICD-10-CM | POA: Diagnosis present

## 2011-01-13 DIAGNOSIS — I739 Peripheral vascular disease, unspecified: Secondary | ICD-10-CM | POA: Diagnosis present

## 2011-01-13 DIAGNOSIS — I509 Heart failure, unspecified: Secondary | ICD-10-CM | POA: Diagnosis present

## 2011-01-13 DIAGNOSIS — N289 Disorder of kidney and ureter, unspecified: Secondary | ICD-10-CM | POA: Diagnosis not present

## 2011-01-13 DIAGNOSIS — I10 Essential (primary) hypertension: Secondary | ICD-10-CM | POA: Diagnosis present

## 2011-01-13 DIAGNOSIS — I252 Old myocardial infarction: Secondary | ICD-10-CM

## 2011-01-13 DIAGNOSIS — Z794 Long term (current) use of insulin: Secondary | ICD-10-CM

## 2011-01-13 DIAGNOSIS — I4891 Unspecified atrial fibrillation: Secondary | ICD-10-CM | POA: Diagnosis present

## 2011-01-13 DIAGNOSIS — Z95810 Presence of automatic (implantable) cardiac defibrillator: Secondary | ICD-10-CM

## 2011-01-13 LAB — COMPREHENSIVE METABOLIC PANEL
AST: 12 U/L (ref 0–37)
Albumin: 3.4 g/dL — ABNORMAL LOW (ref 3.5–5.2)
Chloride: 111 mEq/L (ref 96–112)
Creatinine, Ser: 1.01 mg/dL (ref 0.50–1.10)
Potassium: 3.8 mEq/L (ref 3.5–5.1)
Total Bilirubin: 0.6 mg/dL (ref 0.3–1.2)
Total Protein: 6.5 g/dL (ref 6.0–8.3)

## 2011-01-13 LAB — DIFFERENTIAL
Basophils Absolute: 0 10*3/uL (ref 0.0–0.1)
Eosinophils Absolute: 0.1 10*3/uL (ref 0.0–0.7)
Eosinophils Relative: 2 % (ref 0–5)

## 2011-01-13 LAB — CBC
MCV: 88.1 fL (ref 78.0–100.0)
Platelets: 187 10*3/uL (ref 150–400)
RDW: 13.9 % (ref 11.5–15.5)
WBC: 5.8 10*3/uL (ref 4.0–10.5)

## 2011-01-13 LAB — POCT I-STAT TROPONIN I: Troponin i, poc: 0 ng/mL (ref 0.00–0.08)

## 2011-01-14 ENCOUNTER — Inpatient Hospital Stay (HOSPITAL_COMMUNITY): Payer: Medicare Other

## 2011-01-14 DIAGNOSIS — I5022 Chronic systolic (congestive) heart failure: Secondary | ICD-10-CM

## 2011-01-14 LAB — CARDIAC PANEL(CRET KIN+CKTOT+MB+TROPI)
CK, MB: 2.4 ng/mL (ref 0.3–4.0)
Relative Index: INVALID (ref 0.0–2.5)
Troponin I: <0.3 ng/mL (ref <0.30)
Troponin I: <0.3 ng/mL (ref <0.30)

## 2011-01-14 LAB — HEMOGLOBIN A1C
Hgb A1c MFr Bld: 7.6 % — ABNORMAL HIGH (ref <5.7)
Mean Plasma Glucose: 171 mg/dL — ABNORMAL HIGH (ref <117)

## 2011-01-14 LAB — BASIC METABOLIC PANEL
BUN: 17 mg/dL (ref 6–23)
BUN: 17 mg/dL (ref 6–23)
Chloride: 112 mEq/L (ref 96–112)
Creatinine, Ser: 1.11 mg/dL — ABNORMAL HIGH (ref 0.50–1.10)
GFR calc Af Amer: 63 mL/min — ABNORMAL LOW (ref >90)
GFR calc Af Amer: 60 mL/min — ABNORMAL LOW (ref >90)
GFR calc non Af Amer: 55 mL/min — ABNORMAL LOW (ref >90)
GFR calc non Af Amer: 52 mL/min — ABNORMAL LOW (ref >90)
Glucose, Bld: 274 mg/dL — ABNORMAL HIGH (ref 70–99)
Potassium: 3.4 mEq/L — ABNORMAL LOW (ref 3.5–5.1)
Sodium: 147 mEq/L — ABNORMAL HIGH (ref 135–145)

## 2011-01-14 LAB — GLUCOSE, CAPILLARY
Glucose-Capillary: 200 mg/dL — ABNORMAL HIGH (ref 70–99)
Glucose-Capillary: 235 mg/dL — ABNORMAL HIGH (ref 70–99)

## 2011-01-14 LAB — LIPID PANEL
Cholesterol: 135 mg/dL (ref 0–200)
HDL: 41 mg/dL (ref >39)
Total CHOL/HDL Ratio: 3.3 RATIO
Triglycerides: 103 mg/dL (ref <150)

## 2011-01-14 LAB — CBC
HCT: 35.1 % — ABNORMAL LOW (ref 36.0–46.0)
Platelets: 196 10*3/uL (ref 150–400)
RDW: 13.8 % (ref 11.5–15.5)
WBC: 5.4 10*3/uL (ref 4.0–10.5)

## 2011-01-14 LAB — MRSA PCR SCREENING: MRSA by PCR: NEGATIVE

## 2011-01-14 LAB — TSH: TSH: 1.339 u[IU]/mL (ref 0.350–4.500)

## 2011-01-15 LAB — BASIC METABOLIC PANEL
CO2: 25 mEq/L (ref 19–32)
Calcium: 9.4 mg/dL (ref 8.4–10.5)
Creatinine, Ser: 1.17 mg/dL — ABNORMAL HIGH (ref 0.50–1.10)
GFR calc non Af Amer: 49 mL/min — ABNORMAL LOW (ref >90)
Glucose, Bld: 156 mg/dL — ABNORMAL HIGH (ref 70–99)
Sodium: 142 mEq/L (ref 135–145)

## 2011-01-15 LAB — CBC
Hemoglobin: 12.1 g/dL (ref 12.0–15.0)
MCH: 27.1 pg (ref 26.0–34.0)
MCHC: 31.3 g/dL (ref 30.0–36.0)
Platelets: 190 10*3/uL (ref 150–400)
RBC: 4.47 MIL/uL (ref 3.87–5.11)

## 2011-01-15 LAB — CARDIAC PANEL(CRET KIN+CKTOT+MB+TROPI)
CK, MB: 1.9 ng/mL (ref 0.3–4.0)
CK, MB: 2.2 ng/mL (ref 0.3–4.0)
Total CK: 73 U/L (ref 7–177)
Total CK: 74 U/L (ref 7–177)
Troponin I: <0.3 ng/mL (ref <0.30)

## 2011-01-15 LAB — GLUCOSE, CAPILLARY: Glucose-Capillary: 209 mg/dL — ABNORMAL HIGH (ref 70–99)

## 2011-01-16 DIAGNOSIS — N289 Disorder of kidney and ureter, unspecified: Secondary | ICD-10-CM

## 2011-01-16 LAB — GLUCOSE, CAPILLARY
Glucose-Capillary: 153 mg/dL — ABNORMAL HIGH (ref 70–99)
Glucose-Capillary: 187 mg/dL — ABNORMAL HIGH (ref 70–99)

## 2011-01-16 LAB — BASIC METABOLIC PANEL
BUN: 31 mg/dL — ABNORMAL HIGH (ref 6–23)
CO2: 27 mEq/L (ref 19–32)
Chloride: 102 mEq/L (ref 96–112)
Creatinine, Ser: 1.43 mg/dL — ABNORMAL HIGH (ref 0.50–1.10)
Glucose, Bld: 115 mg/dL — ABNORMAL HIGH (ref 70–99)

## 2011-01-17 LAB — BASIC METABOLIC PANEL
BUN: 33 mg/dL — ABNORMAL HIGH (ref 6–23)
Calcium: 9.5 mg/dL (ref 8.4–10.5)
Creatinine, Ser: 1.24 mg/dL — ABNORMAL HIGH (ref 0.50–1.10)
GFR calc Af Amer: 52 mL/min — ABNORMAL LOW (ref >90)

## 2011-01-17 LAB — GLUCOSE, CAPILLARY: Glucose-Capillary: 261 mg/dL — ABNORMAL HIGH (ref 70–99)

## 2011-01-22 NOTE — H&P (Signed)
NAMEKENDY, [name] NO.:  [id number]  MEDICAL RECORD NO.:  [id number]  LOCATION:  2920                         FACILITY:  [facility]  PHYSICIAN:  Hillis Range, MD       DATE OF BIRTH:  07-20-1945  DATE OF ADMISSION:  01/13/2011 DATE OF DISCHARGE:                             HISTORY & PHYSICAL   CHIEF COMPLAINT:  ICD shock.  HISTORY OF PRESENT ILLNESS:  [name] is a pleasant 65 year old female with a known history of coronary disease, ischemic cardiomyopathy (ejection fraction 25%), New York Heart Association Class 2/3 congestive heart failure, and paroxysmal atrial fibrillation who now presents after having received an ICD shock.  She has a longstanding history of coronary disease for which she previously was managed by the [facility].  Her last left heart catheterization was performed in [place] in May 2011, at which time previous stents were felt to be patent.  She previously had an St. Jude Atlas 2 ICD implanted by Dr. Alanda Amass.  She reports receiving ICD shocks initially for ventricular tachycardia and therefore was placed on sotalol.  She did very well with sotalol for several years.  She tried amiodarone which apparently produced nausea.  She subsequently had no further ICD shocks and her sotalol was discontinued for reasons unclear to the patient.  She reports that she was placed on a Multaq at that time for treatment of her atrial fibrillation and she was also placed on Coumadin.  She subsequently moved to [place] and received care in that area.  She states that back in September her Multaq and Coumadin were discontinued.  Lasix was also discontinued at that time.  She reports having epigastric discomfort over the past few days as well as a "fullness" in her abdomen.  She denies shortness of breath.  She has chronic orthopnea.  She denies edema and palpitations.  She has had, however, presyncope in the emergency  department today.  She reports that earlier this afternoon she was sitting and looking at her phone when she felt a presyncope and received an ICD shock.  She thinks she may have actually lost consciousness for a brief period of time.  Presently, she is resting comfortably, but does report mild epigastric discomfort.  PAST MEDICAL HISTORY: 1. Coronary artery disease with most recent cath April 2011, revealing     patent stents. 2. Ischemic cardiomyopathy. 3. New York Heart Association Class 2/3 chronic congestive heart     failure. 4. Ventricular tachycardia. 5. Status post ICD implantation in 2007. 6. Paroxysmal atrial fibrillation, previously anticoagulated with     Coumadin, however this was stopped in September. 7. Peripheral vascular disease, status post left renal artery     stenting. 8. Diabetes. 9. Gastroparesis. 10.Hypertension. 11.Hyperlipidemia. 12.Diabetic neuropathy.  PAST SURGICAL HISTORY: 1. Hysterectomy. 2. Cholecystectomy. 3. Status post implantation of a St. Jude Atlas 2 ICD by Dr. Alanda Amass     April 08, 2005.  MEDICATIONS: 1. Aspirin 325 mg daily. 2. Coreg 50 mg b.i.d. 3. Crestor 20 mg daily. 4. Vitamin D weekly. 5. Levemir insulin 50 units b.i.d. 6. Insulin sliding scale. 7. Neurontin 400 mg b.i.d.  ALLERGIES:  DIOVAN causes indigestion, PHENERGAN  and PLAVIX causes rash.  SOCIAL HISTORY:  The patient recently moved back to [place] from [place].  She is disabled.  She denies tobacco, alcohol or drug use.  FAMILY HISTORY:  Notable for diabetes and coronary disease.  REVIEW OF SYSTEMS:  All systems reviewed and negative except as outlined in the HPI above.  PHYSICAL EXAMINATION:  Telemetry in the ER reveals atrial paced rhythm with 2 episodes of nonsustained polymorphic ventricular tachycardia which is self-terminated. VITALS:  Blood pressure 181/79, heart rate 57, respirations 18 and sats 98% on room air.  Afebrile. GENERAL:  The patient  is an obese female, in no acute distress.  She is alert and oriented x3.  HEENT:  Normocephalic, atraumatic.  Sclerae clear.  Conjunctivae pink.  Oropharynx clear. NECK:  Supple. LUNGS:  Clear. HEART:  Regular rate and rhythm.  No murmurs, rubs or gallops. GI:  Soft, nontender and nondistended.  Positive bowel sounds. EXTREMITIES:  No clubbing, cyanosis or edema. SKIN:  No ecchymoses or lacerations. MUSCULOSKELETAL:  No deformity or atrophy. PSYCH:  Euthymic mood.  Full affect. NEURO:  Strength and sensation are intact.  DIAGNOSTIC STUDIES:  EKG reveals sinus rhythm at 65 beats per minute with a PR interval of 170 msec.  QRS duration of 90 msec and QT interval of 449 msec.  T-wave inversions are noted across leads V4, V5 and V6. These are unchanged when compared to December 28, 2005.  ICD INTERROGATION:  The patient's ICD is interrogated today and found to be functioning appropriately and the DDD pacing mode with a lower rate limit of 55 beats per minute and an upper tracking rate of 110 beats per minute.  The battery status is good.  Atrial lead P-waves measure 1.4 mV with impedance of 445 ohms and a threshold of 0.5 V at 0.4 msec.  Right ventricular lead R-waves measured greater than 12 mV with impedance of 435 ohms and a threshold of 0.5 V at 0.4 msec.  The patient is observed to have 15 episodes of tachycardia in the VF zone.  These do in fact appeared to be polymorphic ventricular tachycardia and also ventricular fibrillation which spontaneously terminated.  The patient did, however, have 1 episode which occurred today at 9:26 a.m. which terminated with a 30 joule shock delivered from the patient's device.  Atrial fibrillation was also observed back in May.  The patient's device is reprogrammed today.  I cannot increase the VF intervals further than 16.  I did increase, however VT1 intervals to 30 and VT2 intervals to 24.  The patient is programmed with a VT monitor zone, VT2  therapies which include ATP x2 followed by 20 joules, 30 joules, 36 joules x2.  The VF zone therapies are 30 joules, 36 joules x5.  LABORATORY DATA:  Potassium 3.8, creatinine 1.0, hematocrit 36, platelets 187 and troponin 0.03.  IMPRESSION:  Ms. Zawistowski is a pleasant 65 year old female with a history of coronary disease and an ischemic cardiomyopathy who now presents after having received an ICD shock for a polymorphic ventricular tachycardia.  She has had nonsustained polymorphic ventricular tachycardia observed in the ER.  At this time, I think that we should admit the patient to the Electrophysiology Service for further management in the setting of ongoing nonsustained polymorphic ventricular tachycardia.  I am concerned that the increased frequency of her arrhythmias maybe secondary to ischemia.  We will, therefore, cycle the patient's cardiac markers on telemetry.  Presently, she is pain free and, therefore, I will not place her  on heparin.  We will replete her electrolytes.  Obtain an echo and also obtain a thyroid profile.  In addition, we will place the patient on sotalol 120 mg twice daily.  We will follow her in the hospital while we initiate sotalol as our antiarrhythmic drug of choice.  I did offer the patient II antiarrhythmic trial, however she declined.  We will continue her Coreg at this time.  As she continues to have episodes of atrial fibrillation, I think that we will likely have to restart Coumadin in the near future.     Hillis Range, MD     JA/MEDQ  D:  01/13/2011  T:  01/14/2011  Job:  [id number]  Electronically Signed by Hillis Range MD on 01/22/2011 02:36:16 PM

## 2011-01-23 ENCOUNTER — Ambulatory Visit (INDEPENDENT_AMBULATORY_CARE_PROVIDER_SITE_OTHER): Payer: Medicare Other

## 2011-01-23 ENCOUNTER — Ambulatory Visit (INDEPENDENT_AMBULATORY_CARE_PROVIDER_SITE_OTHER): Payer: Medicare Other | Admitting: *Deleted

## 2011-01-23 VITALS — BP 122/72 | HR 70

## 2011-01-23 DIAGNOSIS — I10 Essential (primary) hypertension: Secondary | ICD-10-CM

## 2011-01-23 DIAGNOSIS — I4891 Unspecified atrial fibrillation: Secondary | ICD-10-CM

## 2011-01-23 LAB — BASIC METABOLIC PANEL
BUN: 24 mg/dL — ABNORMAL HIGH (ref 6–23)
CO2: 23 mEq/L (ref 19–32)
Calcium: 8.6 mg/dL (ref 8.4–10.5)
GFR: 46.29 mL/min — ABNORMAL LOW (ref >60.00)
Glucose, Bld: 219 mg/dL — ABNORMAL HIGH (ref 70–99)
Potassium: 3.7 mEq/L (ref 3.5–5.1)

## 2011-01-23 NOTE — Progress Notes (Signed)
In for EKG, lab work post hosp. States she feels good. Has felt occ fast heart rate. BP 122/72. HR 70. Scheduled to see Dr. Johney Frame 11/28. Dr. Excell Seltzer reviewed EKG

## 2011-01-27 ENCOUNTER — Emergency Department (HOSPITAL_COMMUNITY): Payer: Medicare Other

## 2011-01-27 ENCOUNTER — Inpatient Hospital Stay (HOSPITAL_COMMUNITY)
Admission: EM | Admit: 2011-01-27 | Discharge: 2011-02-01 | DRG: 287 | Disposition: A | Discharge status: Home / Self Care | Payer: Medicare Other | Attending: Internal Medicine | Admitting: Internal Medicine

## 2011-01-27 ENCOUNTER — Encounter: Payer: Self-pay | Admitting: Emergency Medicine

## 2011-01-27 ENCOUNTER — Other Ambulatory Visit: Payer: Self-pay

## 2011-01-27 DIAGNOSIS — I480 Paroxysmal atrial fibrillation: Secondary | ICD-10-CM

## 2011-01-27 DIAGNOSIS — I2510 Atherosclerotic heart disease of native coronary artery without angina pectoris: Secondary | ICD-10-CM | POA: Diagnosis present

## 2011-01-27 DIAGNOSIS — I4729 Other ventricular tachycardia: Principal | ICD-10-CM

## 2011-01-27 DIAGNOSIS — E119 Type 2 diabetes mellitus without complications: Secondary | ICD-10-CM | POA: Diagnosis present

## 2011-01-27 DIAGNOSIS — I472 Ventricular tachycardia, unspecified: Principal | ICD-10-CM | POA: Diagnosis present

## 2011-01-27 DIAGNOSIS — I5022 Chronic systolic (congestive) heart failure: Secondary | ICD-10-CM | POA: Diagnosis present

## 2011-01-27 DIAGNOSIS — I517 Cardiomegaly: Secondary | ICD-10-CM | POA: Diagnosis present

## 2011-01-27 DIAGNOSIS — Z7901 Long term (current) use of anticoagulants: Secondary | ICD-10-CM

## 2011-01-27 DIAGNOSIS — Z794 Long term (current) use of insulin: Secondary | ICD-10-CM

## 2011-01-27 DIAGNOSIS — I2589 Other forms of chronic ischemic heart disease: Secondary | ICD-10-CM | POA: Diagnosis present

## 2011-01-27 DIAGNOSIS — Z95810 Presence of automatic (implantable) cardiac defibrillator: Secondary | ICD-10-CM

## 2011-01-27 DIAGNOSIS — I255 Ischemic cardiomyopathy: Secondary | ICD-10-CM

## 2011-01-27 DIAGNOSIS — I509 Heart failure, unspecified: Secondary | ICD-10-CM | POA: Diagnosis present

## 2011-01-27 DIAGNOSIS — Z7982 Long term (current) use of aspirin: Secondary | ICD-10-CM

## 2011-01-27 DIAGNOSIS — I359 Nonrheumatic aortic valve disorder, unspecified: Secondary | ICD-10-CM | POA: Diagnosis present

## 2011-01-27 DIAGNOSIS — Z8673 Personal history of transient ischemic attack (TIA), and cerebral infarction without residual deficits: Secondary | ICD-10-CM

## 2011-01-27 DIAGNOSIS — I4891 Unspecified atrial fibrillation: Secondary | ICD-10-CM | POA: Diagnosis present

## 2011-01-27 HISTORY — DX: Unspecified atrial fibrillation: I48.91

## 2011-01-27 HISTORY — DX: Ventricular tachycardia, unspecified: I47.20

## 2011-01-27 HISTORY — DX: Chronic systolic (congestive) heart failure: I50.22

## 2011-01-27 HISTORY — DX: Presence of automatic (implantable) cardiac defibrillator: Z95.810

## 2011-01-27 HISTORY — DX: Ischemic cardiomyopathy: I25.5

## 2011-01-27 HISTORY — DX: Essential (primary) hypertension: I10

## 2011-01-27 HISTORY — DX: Atherosclerotic heart disease of native coronary artery without angina pectoris: I25.10

## 2011-01-27 HISTORY — DX: Cerebral infarction, unspecified: I63.9

## 2011-01-27 HISTORY — DX: Ventricular tachycardia: I47.2

## 2011-01-27 LAB — CBC
HCT: 37.8 % (ref 36.0–46.0)
Hemoglobin: 12.4 g/dL (ref 12.0–15.0)
MCV: 86.5 fL (ref 78.0–100.0)
RBC: 4.37 MIL/uL (ref 3.87–5.11)
WBC: 7.5 10*3/uL (ref 4.0–10.5)

## 2011-01-27 LAB — TROPONIN I: Troponin I: <0.3 ng/mL (ref <0.30)

## 2011-01-27 LAB — BASIC METABOLIC PANEL
BUN: 22 mg/dL (ref 6–23)
CO2: 20 mEq/L (ref 19–32)
Calcium: 9.2 mg/dL (ref 8.4–10.5)
Chloride: 109 mEq/L (ref 96–112)
Creatinine, Ser: 1.01 mg/dL (ref 0.50–1.10)

## 2011-01-27 MED ORDER — NITROGLYCERIN 0.4 MG SL SUBL: 0.4000 mg | SUBLINGUAL_TABLET | SUBLINGUAL | Status: DC | PRN

## 2011-01-27 MED ORDER — FUROSEMIDE 20 MG PO TABS
20.0000 mg | ORAL_TABLET | Freq: Every day | ORAL | Status: DC
  Administered 2011-01-27 – 2011-01-31 (×4): 20 mg via ORAL
  Filled 2011-01-27 (×5): qty 1

## 2011-01-27 MED ORDER — INSULIN DETEMIR 100 UNIT/ML ~~LOC~~ SOLN
50.0000 [IU] | Freq: Every day | SUBCUTANEOUS | Status: DC
  Administered 2011-01-28: 50 [IU] via SUBCUTANEOUS
  Filled 2011-01-27: qty 3

## 2011-01-27 MED ORDER — VITAMIN D (ERGOCALCIFEROL) 1.25 MG (50000 UNIT) PO CAPS: 50000.0000 [IU] | ORAL_CAPSULE | ORAL | Status: DC

## 2011-01-27 MED ORDER — GABAPENTIN 400 MG PO CAPS
400.0000 mg | ORAL_CAPSULE | Freq: Two times a day (BID) | ORAL | Status: DC
  Administered 2011-01-27 – 2011-02-01 (×10): 400 mg via ORAL
  Filled 2011-01-27 (×13): qty 1

## 2011-01-27 MED ORDER — ROSUVASTATIN CALCIUM 10 MG PO TABS
10.0000 mg | ORAL_TABLET | Freq: Every day | ORAL | Status: DC
  Administered 2011-01-27 – 2011-02-01 (×6): 10 mg via ORAL
  Filled 2011-01-27 (×7): qty 1

## 2011-01-27 MED ORDER — CARVEDILOL 25 MG PO TABS
50.0000 mg | ORAL_TABLET | Freq: Two times a day (BID) | ORAL | Status: DC
  Administered 2011-01-27 – 2011-02-01 (×10): 50 mg via ORAL
  Filled 2011-01-27 (×15): qty 2

## 2011-01-27 NOTE — ED Notes (Signed)
Talked with Dr. Patria Mane about cardiology MD to see pt. States that he will call again for consult on the pt. Family/pt informed of the plan

## 2011-01-27 NOTE — ED Notes (Signed)
Attempt to call report. Nurse unavailable.

## 2011-01-27 NOTE — ED Notes (Signed)
Vital signs stable. 

## 2011-01-27 NOTE — ED Notes (Signed)
Pt awaiting bed and evening medications. Sent message to pharmacy to send coreg and neuron tin.Will continue to monitor.

## 2011-01-27 NOTE — ED Notes (Signed)
IV team paged for start, IV team to see the pt

## 2011-01-27 NOTE — ED Notes (Signed)
Pt at home resting and defibrillator went off.

## 2011-01-27 NOTE — H&P (Signed)
Admit date: 01/27/2011 Referring Physician none Primary Cardiologist Dr. Johney Frame Chief complaint/reason for admission: Ventricular tachycardia/ICD shock  HPI:  The patient is a 65 year old woman who presents with an ICD shock occurring with little warning.  She has a history of ischemic heart disease with depressed left ventricular function ejection fraction at Echo 2012 demonstrated EF 35-40% moderate aortic insufficiency and inferior akinesis. She had presented at that time with a isolated shock from her ICD. This is apparently polymorphic. This is her first ICD discharges is 2008.  She had an ICD implanted in 2003 for abrupt onset syncope. In a state of ischemic heart disease and depressed LV function she underwent a electro-physiological testing demonstrated inducibility for polymorphic ventricular tachycardia and subsequent ICD implantation. In 2007 she had ICD shocks. They attempted to use amiodarone for suppression; however, she developed nausea which resolved after its discontinuation. GI consultation had raised the possibility as to whether the nausea was in fact gastroparesis from her diabetes; however, it did not recur following discontinuation of the amiodarone and sotalol was started. A couple of years later, sotalol was discontinued and multaq-was initiated.  She does not recall whether she had shocks on sotalol or not. She did move to [place] and her anticoagulation and her antiarrhythmic was stopped.  Prior to her recent ICD discharges she noted no change in her functional status. She denies use of over-the-counter supplements.  She has atrial fibrillation and a history of a prior strokes/TIA and Coumadin was reinitiated at her last hospitalization.  I should note that she was previously seen by [facility] but was dismissed from that practice because of financial issues      Past Medical History  Diagnosis Date  . Chronic systolic heart failure   .  Diabetes mellitus     type 1  . Hypertension   . Stroke 2010    eye doctor said she had TIA  . Ischemic cardiomyopathy   . Ventricular tachycardia     Polymorphic  . 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   . Atrial fibrillation       Past Surgical History  Procedure Date  . Abdominal hysterectomy 1980'S  . Cholecystectomy     1990'S  . Tubaligation 1980  . Icd 2003/2007    Allergies: Darvon; Phenergan; and Plavix   (Not in a hospital admission)  No family history on file.  family history notable for coronary disease and diabetes   History   Social History  . Marital Status: Divorced    Spouse Name: N/A    Number of Children: N/A  . Years of Education: N/A   Occupational History  . Not on file.   Social History Main Topics  . Smoking status: Former Smoker    Quit date: 08/16/1995  . Smokeless tobacco: Not on file  . Alcohol Use: No  . Drug Use: No  . Sexually Active: Not on file   Other Topics Concern  . Not on file   Social History Narrative  . No narrative on file     Review of systems is negative part 4 is outlined in the past medical history and history of present illness  Physical Exam: Blood pressure 158/70, pulse 82, temperature 97.5 F (36.4 C), temperature source Oral, resp. rate 15, SpO2 99.00%.   BP 158/70  Pulse 82  Temp(Src) 97.5 F (36.4 C) (Oral)  Resp 15  SpO2 99%  General Appearance:    older Native American woman appearing her stated  age in no acute distress   Head:    Normocephalic, without obvious abnormality, atraumatic  Eyes:    PERRL, conjunctiva/corneas clear, EOM's intact, fundi    benign, both eyes       Ears:    Normal TM's and external ear canals, both ears  Nose:   Nares normal, septum midline, mucosa normal, no drainage   or sinus tenderness  Throat:   Lips, mucosa, and tongue normal; teeth and gums normal  Neck:   Supple,no masses, no thyromegaly, no JVD,  Back:     Symmetric, no curvature, ROM normal, no  CVA tenderness  Lungs:     Clear to auscultation bilaterally, respirations unlabored  Chest wall:    No tenderness or deformity  Heart:    Regular rate and rhythm, S1 and S2 normal, there is a 2/6 diastolic murmur   Abdomen:     Soft, non-tender, bowel sounds normal,    no masses, no organomegaly no aneurysm noted     Extremities:   Extremities normal, atraumatic, no cyanosis or edema  Pulses:   Femoral and distal pulses 2+, No bruits  Skin:   Skin color, texture, turgor normal, no rashes or lesions  Lymph nodes:   Cervical, supraclavicular, and axillary nodes normal  Neurologic:   CNII-XII intact. Normal strength, sensation and reflexes      throughout   Labs:   Lab Results  Component Value Date   WBC 7.5 01/27/2011   HGB 12.4 01/27/2011   HCT 37.8 01/27/2011   MCV 86.5 01/27/2011   PLT 246 01/27/2011    Lab 01/27/11 0950  NA 142  K 4.4  CL 109  CO2 20  BUN 22  CREATININE 1.01  CALCIUM 9.2  PROT --  BILITOT --  ALKPHOS --  ALT --  AST --  GLUCOSE 190*   Lab Results  Component Value Date   CKTOTAL 74 01/15/2011   CKMB 2.2 01/15/2011   TROPONINI <0.30 01/27/2011    Lab Results  Component Value Date   CHOL 135 01/14/2011   Lab Results  Component Value Date   HDL 41 01/14/2011   Lab Results  Component Value Date   LDLCALC 73 01/14/2011   Lab Results  Component Value Date   TRIG 103 01/14/2011   Lab Results  Component Value Date   CHOLHDL 3.3 01/14/2011       Electrocardiogram demonstrated a pacing with intrinsic conduction there are T-wave inversions less prominent there were noted a week ago and a QTc interval of 475 ASSESSMENT AND PLAN:  Patient Active Hospital Problem List: Automatic implantable cardiac defibrillator st judes (01/27/2011)    POA: Unknown   Assessment: Device was interrogated demonstrating polymorphic ventricular tachycardia    Plan: See below    ventricular tachycardia-polymorphic (01/27/2011)    POA: Unknown   Assessment: Recurrent  polymorphic ventricular tachycardia is concerning for underlying ischemic issue. She has known coronary artery disease with her last 2011 demonstrating patent stents. However, given the polymorphic nature of her ventricular tachycardia repeat catheterization is indicated. Thankfully her renal insufficiency has improved. Furthermore sotalol may be admitting polymorphic ventricular tachycardia and I would be reluctant to continue it. She unfortunately did not tolerate amiodarone in the past, but we may need to try again. I've also offer her participation in the shield trial using azimilide. She will consider this.    Plan: As above  Ischemic cardiomyopathy (01/27/2011)    POA: Unknown   Assessment: As above  Plan: Will hold Coumadin and catheter when the INR is down  Atrial fibrillation (01/27/2011)    POA: Unknown   Assessment: Follow    Plan: SCDs for thromboembolism  Type II or unspecified type diabetes mellitus without mention of complication, not stated as uncontrolled (01/27/2011)    POA: Unknown   Assessment: Check a hemoglobin A1c and  do a diabetes consultation    Plan: As above  Signed: @ME1 @ 01/27/2011, 5:42 PM

## 2011-01-27 NOTE — ED Notes (Signed)
Cardiology at the bedside to evaluate the pt. Md to determine POC and possible admission or discharge.

## 2011-01-27 NOTE — ED Notes (Signed)
Patient is resting comfortably. 

## 2011-01-27 NOTE — ED Provider Notes (Signed)
History     CSN: [id number] Arrival date & time: 01/27/2011  9:07 AM   First MD Initiated Contact with Patient 01/27/11 [phone]      Chief Complaint  Patient presents with  . AICD Problem    pt sitting at home resting and defibrilator went off.   pt states this has happened in the past also.    patient reports she was in her normal state of health this morning when her AICD fired.  Is having no active chest pain or shortness of breath at that time.  She currently is without complaints.  She is a long-standing history of ischemic cardiomyopathy EF 25%.  She was recently in the hospital for AICD shock and polymorphic ventricular tachycardia at that time it appears as though her sotalol was changed.  She reports compliance with her medication she denies caffeine intake.  She denies any other changes in her medications.  She currently is taking her Coumadin.  His had no recent nausea vomiting or diarrhea.  Nothing worsens the symptoms.  Nothing improves her symptoms.  She has a St. Jude's AICD   The history is provided by the patient.    No past medical history on file.  No past surgical history on file.  No family history on file.  History  Substance Use Topics  . Smoking status: Not on file  . Smokeless tobacco: Not on file  . Alcohol Use: Not on file    OB History    Grav Para Term Preterm Abortions TAB SAB Ect Mult Living                  Review of Systems  All other systems reviewed and are negative.    Allergies  Darvon; Phenergan; and Plavix  Home Medications   Current Outpatient Rx  Name Route Sig Dispense Refill  . ASPIRIN 81 MG PO TABS Oral Take 81 mg by mouth daily.     [name] CARVEDILOL 25 MG PO TABS Oral Take 50 mg by mouth 2 (two) times daily with a meal.      . FUROSEMIDE 20 MG PO TABS Oral Take 20 mg by mouth daily. For water retention     . GABAPENTIN 400 MG PO CAPS Oral Take 400 mg by mouth 2 (two) times daily.      . INSULIN DETEMIR 100 UNIT/ML [place] SOLN  Subcutaneous Inject 50 Units into the skin at bedtime. 50 units in the morning and 50 units at bedtime     . INSULIN GLULISINE 100 UNIT/ML [place] SOLN Subcutaneous Inject into the skin 3 (three) times daily before meals. Patient is on a sliding scale.    [name] ROSUVASTATIN CALCIUM 10 MG PO TABS Oral Take 10 mg by mouth daily.      [name] SOTALOL HCL 80 MG PO TABS Oral Take 80 mg by mouth 2 (two) times daily.      [name] VITAMIN D (ERGOCALCIFEROL) 50000 UNITS PO CAPS Oral Take 50,000 Units by mouth once a week.      . WARFARIN SODIUM 5 MG PO TABS Oral Take 5 mg by mouth daily.      [name] NITROGLYCERIN 0.4 MG SL SUBL Sublingual Place 0.4 mg under the tongue every 5 (five) minutes as needed. For pain       BP 162/77  Pulse 77  Temp(Src) 98.6 F (37 C) (Oral)  Resp 18  SpO2 99%  Physical Exam  Nursing note and vitals reviewed. Constitutional: She is oriented to  person, place, and time. She appears well-developed and well-nourished. No distress.  HENT:  Head: Normocephalic and atraumatic.  Eyes: EOM are normal.  Neck: Normal range of motion.  Cardiovascular: Normal rate, regular rhythm and normal heart sounds.   Pulmonary/Chest: Effort normal and breath sounds normal.  Abdominal: Soft. She exhibits no distension. There is no tenderness.  Musculoskeletal: Normal range of motion.  Neurological: She is alert and oriented to person, place, and time.  Skin: Skin is warm and dry.  Psychiatric: She has a normal mood and affect. Judgment normal.    ED Course  Procedures (including critical care time)   Date: 01/27/2011  Rate: 70  Rhythm: normal sinus rhythm  QRS Axis: normal  Intervals: normal  ST/T Wave abnormalities: nonspecific ST/T changes  Conduction Disutrbances:none  Narrative Interpretation: PVC noted  Old EKG Reviewed: unchanged    Labs Reviewed  BASIC METABOLIC PANEL - Abnormal; Notable for the following:    Glucose, Bld 190 (*)    GFR calc non Af Amer 58 (*)    GFR calc Af Amer 67 (*)     All other components within normal limits  PROTIME-INR - Abnormal; Notable for the following:    Prothrombin Time 31.1 (*)    INR 2.94 (*)    All other components within normal limits  TROPONIN I  CBC   Dg Chest 2 View  01/27/2011  *RADIOLOGY REPORT*  Clinical Data: Pacemaker firing  CHEST - 2 VIEW  Comparison: Chest x-ray of 01/13/2011  Findings: No active infiltrate or effusion is seen.  Mild cardiomegaly is stable.  A permanent pacemaker with AICD lead appears unchanged in position.  There may be mild pulmonary vascular congestion present.  No bony abnormality is seen.  IMPRESSION: Cardiomegaly.  Question mild pulmonary vascular congestion.  Original Report Authenticated By: Juline Patch, M.D.     1. Ventricular tachycardia       MDM  [facility] cardiology called for evaluation and admission. No complaints at this time. Frequent PVCs. St judes evaluated the AICD in the ER and reports appropriate shock of VT/VF.   11:33 AM Cardiology to evaluate in ER.         Lyanne Co, MD 01/27/11 1137

## 2011-01-28 ENCOUNTER — Encounter (HOSPITAL_COMMUNITY): Payer: Self-pay | Admitting: *Deleted

## 2011-01-28 DIAGNOSIS — I2589 Other forms of chronic ischemic heart disease: Secondary | ICD-10-CM

## 2011-01-28 LAB — GLUCOSE, CAPILLARY
Glucose-Capillary: 247 mg/dL — ABNORMAL HIGH (ref 70–99)
Glucose-Capillary: 277 mg/dL — ABNORMAL HIGH (ref 70–99)
Glucose-Capillary: 307 mg/dL — ABNORMAL HIGH (ref 70–99)

## 2011-01-28 LAB — CARDIAC PANEL(CRET KIN+CKTOT+MB+TROPI)
CK, MB: 3.1 ng/mL (ref 0.3–4.0)
CK, MB: 3 ng/mL (ref 0.3–4.0)
CK, MB: 2.8 ng/mL (ref 0.3–4.0)
Relative Index: INVALID (ref 0.0–2.5)
Total CK: 95 U/L (ref 7–177)
Total CK: 98 U/L (ref 7–177)

## 2011-01-28 LAB — BASIC METABOLIC PANEL
BUN: 17 mg/dL (ref 6–23)
CO2: 24 mEq/L (ref 19–32)
Calcium: 9.1 mg/dL (ref 8.4–10.5)
Creatinine, Ser: 0.95 mg/dL (ref 0.50–1.10)
GFR calc non Af Amer: 62 mL/min — ABNORMAL LOW (ref >90)
Glucose, Bld: 211 mg/dL — ABNORMAL HIGH (ref 70–99)
Sodium: 143 mEq/L (ref 135–145)

## 2011-01-28 LAB — MRSA PCR SCREENING: MRSA by PCR: NEGATIVE

## 2011-01-28 LAB — PROTIME-INR
INR: 3.75 — ABNORMAL HIGH (ref 0.00–1.49)
Prothrombin Time: 37.6 seconds — ABNORMAL HIGH (ref 11.6–15.2)

## 2011-01-28 MED ORDER — INSULIN ASPART 100 UNIT/ML ~~LOC~~ SOLN
0.0000 [IU] | Freq: Three times a day (TID) | SUBCUTANEOUS | Status: DC
  Administered 2011-01-28: 15 [IU] via SUBCUTANEOUS
  Administered 2011-01-28: 11 [IU] via SUBCUTANEOUS
  Administered 2011-01-29 (×2): 7 [IU] via SUBCUTANEOUS
  Administered 2011-01-29: 11 [IU] via SUBCUTANEOUS
  Administered 2011-01-30: 3 [IU] via SUBCUTANEOUS
  Administered 2011-01-31: 11 [IU] via SUBCUTANEOUS
  Administered 2011-01-31: 4 [IU] via SUBCUTANEOUS
  Administered 2011-02-01: 3 [IU] via SUBCUTANEOUS
  Filled 2011-01-28: qty 3

## 2011-01-28 MED ORDER — SODIUM CHLORIDE 0.9 % IJ SOLN
3.0000 mL | Freq: Two times a day (BID) | INTRAMUSCULAR | Status: DC
  Administered 2011-01-28: 10 mL via INTRAVENOUS
  Administered 2011-01-28 – 2011-01-29 (×4): 3 mL via INTRAVENOUS
  Administered 2011-01-30: 09:00:00 via INTRAVENOUS
  Administered 2011-01-30 – 2011-01-31 (×3): 3 mL via INTRAVENOUS

## 2011-01-28 MED ORDER — DEXTROSE 5 % IV SOLN
60.0000 mg/h | INTRAVENOUS | Status: DC
  Administered 2011-01-28: 60 mg/h via INTRAVENOUS
  Filled 2011-01-28: qty 9

## 2011-01-28 MED ORDER — ACETAMINOPHEN 325 MG PO TABS
650.0000 mg | ORAL_TABLET | ORAL | Status: DC | PRN
  Administered 2011-01-28 – 2011-01-29 (×3): 650 mg via ORAL
  Filled 2011-01-28: qty 1
  Filled 2011-01-28 (×2): qty 2

## 2011-01-28 MED ORDER — MAGNESIUM SULFATE 40 MG/ML IJ SOLN
2.0000 g | Freq: Once | INTRAMUSCULAR | Status: DC
  Filled 2011-01-28: qty 50

## 2011-01-28 MED ORDER — SODIUM CHLORIDE 0.9 % IV SOLN
INTRAVENOUS | Status: DC
  Administered 2011-01-28: 10 mL via INTRAVENOUS
  Administered 2011-01-29: 05:00:00 via INTRAVENOUS

## 2011-01-28 MED ORDER — AMIODARONE LOAD VIA INFUSION
150.0000 mg | Freq: Once | INTRAVENOUS | Status: AC
  Administered 2011-01-28: 150 mg via INTRAVENOUS
  Filled 2011-01-28: qty 151.2

## 2011-01-28 MED ORDER — DEXTROSE 5 % IV SOLN
30.0000 mg/h | INTRAVENOUS | Status: DC
  Administered 2011-01-28: 30 mg/h via INTRAVENOUS
  Filled 2011-01-28 (×2): qty 9

## 2011-01-28 MED ORDER — ONDANSETRON HCL 4 MG/2ML IJ SOLN: 4.0000 mg | Freq: Four times a day (QID) | INTRAMUSCULAR | Status: DC | PRN

## 2011-01-28 MED ORDER — MAGNESIUM SULFATE 50 % IJ SOLN
2.0000 g | Freq: Once | INTRAVENOUS | Status: AC
  Administered 2011-01-28: 2 g via INTRAVENOUS
  Filled 2011-01-28: qty 4

## 2011-01-28 MED ORDER — PHYTONADIONE 5 MG PO TABS
5.0000 mg | ORAL_TABLET | Freq: Once | ORAL | Status: AC
  Administered 2011-01-28: 5 mg via ORAL
  Filled 2011-01-28: qty 1

## 2011-01-28 MED ORDER — DEXTROSE 5 % IV SOLN
60.0000 mg/h | INTRAVENOUS | Status: AC
  Filled 2011-01-28 (×2): qty 9

## 2011-01-28 MED ORDER — POTASSIUM CHLORIDE CRYS ER 20 MEQ PO TBCR
20.0000 meq | EXTENDED_RELEASE_TABLET | Freq: Every day | ORAL | Status: DC
  Administered 2011-01-28 – 2011-01-31 (×4): 20 meq via ORAL
  Filled 2011-01-28 (×5): qty 1

## 2011-01-28 MED ORDER — DEXTROSE 5 % IV SOLN
30.0000 mg/h | INTRAVENOUS | Status: DC
  Administered 2011-01-28 – 2011-01-29 (×2): 30 mg/h via INTRAVENOUS
  Filled 2011-01-28: qty 9

## 2011-01-28 NOTE — Progress Notes (Signed)
ANTICOAGULATION CONSULT NOTE - Initial Consult  Pharmacy Consult for Coumaidn Indication: atrial fibrillation  Allergies  Allergen Reactions  . Darvon Other (See Comments)    indigestion  . Phenergan Other (See Comments)    hyperactivity  . Plavix (Clopidogrel Bisulfate) Rash    Patient Measurements: Height: 5\' 3"  (160 cm) Weight: 183 lb 13.8 oz (83.4 kg) IBW/kg (Calculated) : 52.4   Vital Signs: Temp: 98 F (36.7 C) (11/07 0758) Temp src: Oral (11/07 0758) BP: 132/72 mmHg (11/07 0500) Pulse Rate: 70  (11/07 0500)  Labs:  Basename 01/28/11 0620 01/28/11 0152 01/27/11 1114 01/27/11 0950  HGB -- -- 12.4 --  HCT -- -- 37.8 --  PLT -- -- 246 --  APTT -- -- -- --  LABPROT 37.6* -- -- 31.1*  INR 3.75* -- -- 2.94*  HEPARINUNFRC -- -- -- --  CREATININE 0.95 -- -- 1.01  CKTOTAL 95 159 -- --  CKMB 3.0 3.1 -- --  TROPONINI <0.30 0.34* -- <0.30   Estimated Creatinine Clearance: 62 ml/min (by C-G formula based on Cr of 0.95).  Medical History: Past Medical History  Diagnosis Date  . Chronic systolic heart failure   . Diabetes mellitus     type 1  . Hypertension   . Stroke 2010    eye doctor said she had TIA  . Ischemic cardiomyopathy   . Ventricular tachycardia     Polymorphic  . 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   . Atrial fibrillation     Medications:  Prescriptions prior to admission  Medication Sig Dispense Refill  . aspirin 81 MG tablet Take 81 mg by mouth daily.       . carvedilol (COREG) 25 MG tablet Take 50 mg by mouth 2 (two) times daily with a meal.        . furosemide (LASIX) 20 MG tablet Take 20 mg by mouth daily. For water retention       . gabapentin (NEURONTIN) 400 MG capsule Take 400 mg by mouth 2 (two) times daily.        . insulin detemir (LEVEMIR) 100 UNIT/ML injection Inject 50 Units into the skin at bedtime. 50 units in the morning and 50 units at bedtime      . insulin glulisine (APIDRA) 100 UNIT/ML injection Inject into the  skin 3 (three) times daily before meals. Patient is on a sliding scale.      . rosuvastatin (CRESTOR) 10 MG tablet Take 10 mg by mouth daily.        . sotalol (BETAPACE) 80 MG tablet Take 80 mg by mouth 2 (two) times daily.        . Vitamin D, Ergocalciferol, (DRISDOL) 50000 UNITS CAPS Take 50,000 Units by mouth once a week.        . warfarin (COUMADIN) 5 MG tablet Take 5 mg by mouth daily.        . nitroGLYCERIN (NITROSTAT) 0.4 MG SL tablet Place 0.4 mg under the tongue every 5 (five) minutes as needed. For pain        Scheduled:    . amiodarone  150 mg Intravenous Once  . carvedilol  50 mg Oral BID WC  . furosemide  20 mg Oral Daily  . gabapentin  400 mg Oral BID  . insulin aspart  0-20 Units Subcutaneous TID WC  . insulin detemir  50 Units Subcutaneous QHS  . magnesium sulfate infusion  2 g Intravenous Once  . phytonadione  5 mg Oral Once  .  potassium chloride  20 mEq Oral Daily  . rosuvastatin  10 mg Oral Daily  . sodium chloride  3 mL Intravenous Q12H  . Vitamin D (Ergocalciferol)  50,000 Units Oral Weekly  . DISCONTD: magnesium sulfate IVPB  2 g Intravenous Once    Assessment: 20 yof with afib on chronic comadin prior to admission. Patient with episodes of VT and started on an amiodarone infusion overnight. INR  is elevated this am and plans are for cath once INR subsides. Patient was given 5mg  of vit k. Noted orders to hold coumadin until post cath. Will continue to follow along and thank you for the consult. Goal of Therapy:  INR 2-3   Plan:  1.Hold coumadin until after patient goes to cath.  Severiano Gilbert 01/28/2011,9:31 AM

## 2011-01-28 NOTE — Progress Notes (Signed)
CRITICAL VALUE ALERT  Critical value received: Troponin 0.34  Date of notification: 01/28/2011   Time of notification: 3:29 AM  Critical value read back:yes  Nurse who received alert:Kanija Remmel, Sheffield Slider   MD notified (1st page): Dr Gala Romney  Time of first page: 3:33 AM  Responding MD: Dr Gala Romney Time MD responded: 3:34 AM

## 2011-01-28 NOTE — Progress Notes (Signed)
SUBJECTIVE: The patient is doing well today.  At this time, she denies chest pain, shortness of breath, or any new concerns.   OBJECTIVE: Physical Exam: Filed Vitals:   01/28/11 0300 01/28/11 0400 01/28/11 0500 01/28/11 0758  BP: 148/67 133/63 132/72   Pulse: 67 68 70   Temp:  98 F (36.7 C)  98 F (36.7 C)  TempSrc:    Oral  Resp:      Height:      Weight:   183 lb 13.8 oz (83.4 kg)   SpO2: 96% 94% 95%     Intake/Output Summary (Last 24 hours) at 01/28/11 0841 Last data filed at 01/28/11 0657  Gross per 24 hour  Intake    240 ml  Output    900 ml  Net   -660 ml    Telemetry reveals sinus rhythm with nonsustained VT  GEN- The patient is well appearing, alert and oriented x 3 today.   Head- normocephalic, atraumatic Eyes-  Sclera clear, conjunctiva pink Ears- hearing intact Oropharynx- clear Neck- supple, no JVP Lymph- no cervical lymphadenopathy Lungs- Clear to ausculation bilaterally, normal work of breathing Heart- Regular rate and rhythm, no murmurs, rubs or gallops, PMI not laterally displaced GI- soft, NT, ND, + BS Extremities- no clubbing, cyanosis, trace edema Skin- no rash or lesion Psych- euthymic mood, full affect Neuro- strength and sensation are intact  LABS: Basic Metabolic Panel:  Basename 01/28/11 0620 01/27/11 0950  NA 143 142  K 3.2* 4.4  CL 107 109  CO2 24 20  GLUCOSE 211* 190*  BUN 17 22  CREATININE 0.95 1.01  CALCIUM 9.1 9.2  MG 1.6 --  PHOS -- --   Liver Function Tests: No results found for this basename: AST:2,ALT:2,ALKPHOS:2,BILITOT:2,PROT:2,ALBUMIN:2 in the last 72 hours No results found for this basename: LIPASE:2,AMYLASE:2 in the last 72 hours CBC:  Basename 01/27/11 1114  WBC 7.5  NEUTROABS --  HGB 12.4  HCT 37.8  MCV 86.5  PLT 246   Cardiac Enzymes:  Basename 01/28/11 0620 01/28/11 0152 01/27/11 0950  CKTOTAL 95 159 --  CKMB 3.0 3.1 --  CKMBINDEX -- -- --  TROPONINI <0.30 0.34* <0.30   BNP: No results found  for this basename: POCBNP:3 in the last 72 hours D-Dimer: No results found for this basename: DDIMER:2 in the last 72 hours Hemoglobin A1C: No results found for this basename: HGBA1C in the last 72 hours Fasting Lipid Panel: No results found for this basename: CHOL,HDL,LDLCALC,TRIG,CHOLHDL,LDLDIRECT in the last 72 hours Thyroid Function Tests: No results found for this basename: TSH,T4TOTAL,FREET3,T3FREE,THYROIDAB in the last 72 hours Anemia Panel: No results found for this basename: VITAMINB12,FOLATE,FERRITIN,TIBC,IRON,RETICCTPCT in the last 72 hours  RADIOLOGY: Dg Chest 2 View  01/27/2011  *RADIOLOGY REPORT*  Clinical Data: Pacemaker firing  CHEST - 2 VIEW  Comparison: Chest x-ray of 01/13/2011  Findings: No active infiltrate or effusion is seen.  Mild cardiomegaly is stable.  A permanent pacemaker with AICD lead appears unchanged in position.  There may be mild pulmonary vascular congestion present.  No bony abnormality is seen.  IMPRESSION: Cardiomegaly.  Question mild pulmonary vascular congestion.  Original Report Authenticated By: Juline Patch, M.D.   Dg Chest 2 View  01/13/2011  *RADIOLOGY REPORT*  Clinical Data: Chest pain.  Internal defibrillator fired. Hypertension and diabetes.  CHEST - 2 VIEW  Comparison: 07/11/2005  Findings: Heart size remains at the upper limit normal.  AICD remains in appropriate position.  Both lungs are clear.  No evidence  of pleural effusion or pneumothorax.  No mass or lymphadenopathy identified.  IMPRESSION: Stable exam.  No active disease.  Original Report Authenticated By: Danae Orleans, M.D.    ASSESSMENT AND PLAN:  1.  VT- she continues to have runs of PMVT  Will stop sotalol and initiate IV amiodarone (risks of amiodarone discussed at length with pt who wishes to retry this medications). 2.  CAD- no ischemic symptoms, given polymorphic nature of VT, I agree with Dr Graciela Husbands that repeat cath once INR<2 is reasonable. 3.  Afib- hold coumadin for  cath 4.  Ischemic CM- stable with NYHA Class III CHF symptoms,  Continue lasix and monitor 5.  K/Mg- replete.    Hillis Range, MD 01/28/2011 8:41 AM

## 2011-01-29 ENCOUNTER — Encounter: Payer: Self-pay | Admitting: Internal Medicine

## 2011-01-29 LAB — BASIC METABOLIC PANEL
Chloride: 106 mEq/L (ref 96–112)
GFR calc Af Amer: 61 mL/min — ABNORMAL LOW (ref >90)
Potassium: 3.5 mEq/L (ref 3.5–5.1)
Sodium: 140 mEq/L (ref 135–145)

## 2011-01-29 LAB — GLUCOSE, CAPILLARY
Glucose-Capillary: 294 mg/dL — ABNORMAL HIGH (ref 70–99)
Glucose-Capillary: 334 mg/dL — ABNORMAL HIGH (ref 70–99)

## 2011-01-29 LAB — PROTIME-INR
INR: 1.92 — ABNORMAL HIGH (ref 0.00–1.49)
Prothrombin Time: 22.3 seconds — ABNORMAL HIGH (ref 11.6–15.2)

## 2011-01-29 MED ORDER — AMIODARONE HCL 200 MG PO TABS
200.0000 mg | ORAL_TABLET | Freq: Three times a day (TID) | ORAL | Status: DC
  Administered 2011-01-29 – 2011-02-01 (×10): 200 mg via ORAL
  Filled 2011-01-29 (×13): qty 1

## 2011-01-29 MED ORDER — SODIUM CHLORIDE 0.9 % IV SOLN
1.0000 mL/kg/h | INTRAVENOUS | Status: DC
Start: 2011-01-30 — End: 2011-01-30
  Administered 2011-01-30: 1.006 mL/kg/h via INTRAVENOUS

## 2011-01-29 MED ORDER — SODIUM CHLORIDE 0.9 % IV SOLN: 250.0000 mL | INTRAVENOUS | Status: DC

## 2011-01-29 MED ORDER — SODIUM CHLORIDE 0.9 % IJ SOLN: 3.0000 mL | INTRAMUSCULAR | Status: DC | PRN

## 2011-01-29 MED ORDER — ASPIRIN 81 MG PO CHEW
324.0000 mg | CHEWABLE_TABLET | ORAL | Status: AC
  Administered 2011-01-30: 324 mg via ORAL
  Filled 2011-01-29: qty 4

## 2011-01-29 MED ORDER — INSULIN DETEMIR 100 UNIT/ML ~~LOC~~ SOLN
70.0000 [IU] | Freq: Every day | SUBCUTANEOUS | Status: DC
  Administered 2011-01-29 – 2011-01-31 (×3): 70 [IU] via SUBCUTANEOUS

## 2011-01-29 MED ORDER — SODIUM CHLORIDE 0.9 % IJ SOLN: 3.0000 mL | Freq: Two times a day (BID) | INTRAMUSCULAR | Status: DC

## 2011-01-29 NOTE — Discharge Summary (Signed)
[name], [name] NO.:  [id number]  MEDICAL RECORD NO.:  [id number]  LOCATION:  2039                         FACILITY:  [facility]  PHYSICIAN:  [name] Sidle, MD DATE OF BIRTH:  10-Mar-1946  DATE OF ADMISSION:  01/13/2011 DATE OF DISCHARGE:  01/17/2011                              DISCHARGE SUMMARY   PRIMARY CARDIOLOGIST AND ELECTROPHYSIOLOGIST:  [name].  REASON FOR ADMISSION:  ICD shock.  DISCHARGE DIAGNOSES: 1. 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     a.     Sotalol therapy initiated this admission. 2. Paroxysmal atrial fibrillation noted upon interrogation of ICD.     a.     Coumadin therapy initiated at discharge. 3. Acute on chronic systolic congestive heart failure-compensated     prior to discharge.     a.     Complicated by acute renal insufficiency-improved prior to      discharge. 4. Coronary artery disease status post previous percutaneous coronary     intervention.     a.     Cardiac catheterization April 2011 demonstrating patent      stent. 5. Ischemic cardiomyopathy.  Echocardiogram January 14, 2011:     Moderate focal basal hypertrophy of the septum, ejection fraction     35-40%, inferior akinesis, grade 1 diastolic dysfunction, moderate     aortic insufficiency. 6. Status post automatic implantable cardioverter-defibrillator. 7. PAD status post left renal artery stenting. 8. Diabetes mellitus. 9. Diabetic gastroparesis. 10.Hypertension. 11.Hyperlipidemia. 12.Diabetic neuropathy.  ADMISSION HISTORY:  [name] is a 65 year old female who presented on the date of admission with recent history of epigastric discomfort and fullness over the previous few days.  She had no shortness of breath.  She described chronic orthopnea.  She had near syncope in the emergency room.  Earlier in the day, she had felt near syncopal and received an ICD shock.  Interrogation of her device did  demonstrate polymorphic ventricular tachycardia with appropriate ICD therapy.  She has had a previous heart catheterization in [place] in May 2011 that demonstrated patent stents.  She had been placed on sotalol in the past for ventricular tachycardia.  She was tried on amiodarone but this apparently produced nausea.  She also had been tried on Multaq for treatment of atrial fibrillation as well as taking Coumadin.  Her Multaq and Coumadin were both discontinued in the [place] area.  Her Lasix was also discontinued.  Of note, interrogation of her device did demonstrate episodes of paroxysmal atrial fibrillation.  She was admitted for further evaluation and treatment.  HOSPITAL COURSE:  She was felt to be mildly volume overloaded upon admission and she was placed on Lasix for diuresis.  It was decided to place her back on sotalol 120 mg b.i.d. for control of her ventricular tachycardia.  She did rule out for myocardial infarction by enzymes.  No further ischemic testing was felt to be warranted.  Her QT was monitored on serial ECGs.  She did develop acute renal insufficiency with a peak creatinine of 1.43.  Her Lasix was discontinued.  Her sotalol dose was decreased to 80 mg twice a day.  She had no further ventricular tachycardia.  It was felt that she could be discharged home on p.r.n. Lasix for a weight gain of 2 pounds or more in 1 day.  She was evaluated by [name] on date of discharge and felt to be stable enough for discharge to home.  I did discuss with [name] prior to discharge whether or not the patient should be on Coumadin.  He agreed that she should be on Coumadin for paroxysmal atrial fibrillation with her significant thromboembolic risk factors.  The patient is agreeable to this.  She will be started on Coumadin 5 mg daily with close followup in the Coumadin Clinic next week as well as the ECG next week and a basic metabolic profile given her recent acute renal  insufficiency.  LABS AND ANCILLARY DATA:  Hemoglobin 12.1, platelet count 190,000, potassium 3.9, and creatinine 1.24 at discharge.  Hemoglobin A1c 7.6, cardiac markers negative x4.  Total cholesterol 135, triglycerides 103, HDL 41, LDL 73, TSH 1.339.  Chest. x-ray on admission demonstrates no active disease.  DISCHARGE MEDICATIONS: 1. Coumadin 5 mg daily-this is new. 2. Lasix 20 mg daily p.r.n. weight gain of 2 pounds or more in 1 day. 3. Nitroglycerin p.r.n. chest pain. 4. Sotalol 80 mg b.i.d.-this is new. 5. Aspirin 81 mg daily. 6. Apidra 6-16 units subcu 3 times a day with meals as needed. 7. Carvedilol 25 mg 2 tablets twice daily. 8. Crestor 10 mg daily. 9. Gabapentin 400 mg twice daily. 10.Levemir 50 units subcu twice daily. 11.Omeprazole 20 mg daily. 12.Vitamin D2 50,000 units weekly.  ALLERGIES:  MEXILETINE, PLAVIX, DARVON, ATORVASTATIN, AMIODARONE, PROMETHAZINE.  ACTIVITY:  She is to increase activity slowly.  No lifting or sexual activity for 2 weeks.  She should refrain from driving until she sees [name] back in followup.  DIET:  Low-fat, low-sodium, diabetic diet.  Wound care is not applicable.  FOLLOWUP: 1. She will have a Coumadin Clinic appointment arranged next week and     the office will contact with an appointment. 2. She will have a basic metabolic panel drawn next week and the     office will contact her with an appointment. 3. She will see the nurse for an ECG next week in the office to follow     up on her QTc, given sotalol therapy, and     the office will contact her with an appointment. 4. She will see [name] in 4 weeks and office will contact with an     appointment.  Total physician and PA time greater than 30 minutes on this discharge.     [name] Newcomer, PA-C   ______________________________ [name] Sidle, MD    SW/MEDQ  D:  01/17/2011  T:  01/17/2011  Job:  [id number]  Electronically Signed by [name] Newcomer PA-C on  01/24/2011 08:20:26 PM Electronically Signed by [name] Dell MD on 01/29/2011 08:32:12 AM

## 2011-01-29 NOTE — Progress Notes (Signed)
SUBJECTIVE: The patient is doing well today.  At this time, she denies chest pain, shortness of breath, or any new concerns.   OBJECTIVE: Physical Exam: Filed Vitals:   01/29/11 0000 01/29/11 0006 01/29/11 0400 01/29/11 0500  BP:  125/58 101/57 95/54  Pulse: 72 70 75 78  Temp: 97.8 F (36.6 C)  98 F (36.7 C)   TempSrc: Oral  Oral   Resp:      Height:      Weight:    186 lb 4.6 oz (84.5 kg)  SpO2: 94% 93% 97% 94%    Intake/Output Summary (Last 24 hours) at 01/29/11 0855 Last data filed at 01/29/11 0600  Gross per 24 hour  Intake 1203.48 ml  Output   2200 ml  Net -996.52 ml    Telemetry reveals afib this am  GEN- The patient is well appearing, alert and oriented x 3 today.   Head- normocephalic, atraumatic Eyes-  Sclera clear, conjunctiva pink Ears- hearing intact Oropharynx- clear Neck- supple, no JVP Lymph- no cervical lymphadenopathy Lungs- Clear to ausculation bilaterally, normal work of breathing Heart- irregular rate and rhythm, no murmurs, rubs or gallops, PMI not laterally displaced GI- soft, NT, ND, + BS Extremities- no clubbing, cyanosis, trace edema Skin- no rash or lesion Psych- euthymic mood, full affect Neuro- strength and sensation are intact  LABS: Basic Metabolic Panel:  Basename 01/29/11 0510 01/28/11 0620  NA 140 143  K 3.5 3.2*  CL 106 107  CO2 23 24  GLUCOSE 251* 211*  BUN 20 17  CREATININE 1.09 0.95  CALCIUM 9.1 9.1  MG -- 1.6  PHOS -- --   Liver Function Tests: No results found for this basename: AST:2,ALT:2,ALKPHOS:2,BILITOT:2,PROT:2,ALBUMIN:2 in the last 72 hours No results found for this basename: LIPASE:2,AMYLASE:2 in the last 72 hours CBC:  Basename 01/27/11 1114  WBC 7.5  NEUTROABS --  HGB 12.4  HCT 37.8  MCV 86.5  PLT 246   Cardiac Enzymes:  Basename 01/28/11 1140 01/28/11 0620 01/28/11 0152  CKTOTAL 98 95 159  CKMB 2.8 3.0 3.1  CKMBINDEX -- -- --  TROPONINI <0.30 <0.30 0.34*   BNP: No results found for  this basename: POCBNP:3 in the last 72 hours D-Dimer: No results found for this basename: DDIMER:2 in the last 72 hours Hemoglobin A1C: No results found for this basename: HGBA1C in the last 72 hours Fasting Lipid Panel: No results found for this basename: CHOL,HDL,LDLCALC,TRIG,CHOLHDL,LDLDIRECT in the last 72 hours Thyroid Function Tests: No results found for this basename: TSH,T4TOTAL,FREET3,T3FREE,THYROIDAB in the last 72 hours Anemia Panel: No results found for this basename: VITAMINB12,FOLATE,FERRITIN,TIBC,IRON,RETICCTPCT in the last 72 hours  RADIOLOGY: Dg Chest 2 View  01/27/2011  *RADIOLOGY REPORT*  Clinical Data: Pacemaker firing  CHEST - 2 VIEW  Comparison: Chest x-ray of 01/13/2011  Findings: No active infiltrate or effusion is seen.  Mild cardiomegaly is stable.  A permanent pacemaker with AICD lead appears unchanged in position.  There may be mild pulmonary vascular congestion present.  No bony abnormality is seen.  IMPRESSION: Cardiomegaly.  Question mild pulmonary vascular congestion.  Original Report Authenticated By: Juline Patch, M.D.   Dg Chest 2 View  01/13/2011  *RADIOLOGY REPORT*  Clinical Data: Chest pain.  Internal defibrillator fired. Hypertension and diabetes.  CHEST - 2 VIEW  Comparison: 07/11/2005  Findings: Heart size remains at the upper limit normal.  AICD remains in appropriate position.  Both lungs are clear.  No evidence of pleural effusion or pneumothorax.  No mass  or lymphadenopathy identified.  IMPRESSION: Stable exam.  No active disease.  Original Report Authenticated By: Danae Orleans, M.D.    ASSESSMENT AND PLAN:  1.  VT- improved with amiodarone  Convert to PO amiodarone today   (risks of amiodarone discussed at length with pt who wishes to retry this medications). 2.  CAD- given polymorphic nature of VT, I agree with Dr Graciela Husbands that repeat cath once INR is <1.8 is reasonable. 3.  Afib- hold coumadin for cath 4.  Ischemic CM- stable with NYHA Class  III CHF symptoms,  Continue lasix and monitor    Hillis Range, MD 01/29/2011 8:55 AM

## 2011-01-29 NOTE — Progress Notes (Signed)
Noted Levemir increased to 70 units QHS today (will get increased dose tonight).  Pt to be NPO after midnight tonight.  May need meal coverage at some point, but want to see how increase in Levemir affects pt's CBGs.  Fairly well controlled at home as evidenced by A1C of 7.6%.  Will follow.

## 2011-01-30 ENCOUNTER — Encounter (HOSPITAL_COMMUNITY)
Admission: EM | Disposition: A | Discharge status: Home / Self Care | Payer: Self-pay | Source: Home / Self Care | Attending: Internal Medicine

## 2011-01-30 ENCOUNTER — Encounter (HOSPITAL_COMMUNITY): Payer: Self-pay

## 2011-01-30 DIAGNOSIS — I2510 Atherosclerotic heart disease of native coronary artery without angina pectoris: Secondary | ICD-10-CM

## 2011-01-30 HISTORY — PX: LEFT HEART CATHETERIZATION WITH CORONARY ANGIOGRAM: SHX5451

## 2011-01-30 LAB — CREATININE, SERUM
Creatinine, Ser: 1.05 mg/dL (ref 0.50–1.10)
GFR calc non Af Amer: 55 mL/min — ABNORMAL LOW (ref >90)

## 2011-01-30 LAB — GLUCOSE, CAPILLARY: Glucose-Capillary: 151 mg/dL — ABNORMAL HIGH (ref 70–99)

## 2011-01-30 LAB — BASIC METABOLIC PANEL
CO2: 25 mEq/L (ref 19–32)
Calcium: 9.3 mg/dL (ref 8.4–10.5)
Chloride: 109 mEq/L (ref 96–112)
Sodium: 144 mEq/L (ref 135–145)

## 2011-01-30 LAB — CBC
HCT: 37.8 % (ref 36.0–46.0)
MCHC: 32.5 g/dL (ref 30.0–36.0)
RDW: 13.9 % (ref 11.5–15.5)

## 2011-01-30 LAB — PROTIME-INR: INR: 1.54 — ABNORMAL HIGH (ref 0.00–1.49)

## 2011-01-30 SURGERY — LEFT HEART CATHETERIZATION WITH CORONARY ANGIOGRAM
Anesthesia: LOCAL

## 2011-01-30 MED ORDER — HEPARIN (PORCINE) IN NACL 2-0.9 UNIT/ML-% IJ SOLN
INTRAMUSCULAR | Status: AC
  Filled 2011-01-30: qty 1000

## 2011-01-30 MED ORDER — MIDAZOLAM HCL 2 MG/2ML IJ SOLN
INTRAMUSCULAR | Status: AC
  Filled 2011-01-30: qty 2

## 2011-01-30 MED ORDER — NITROGLYCERIN 0.2 MG/ML ON CALL CATH LAB
INTRAVENOUS | Status: AC
  Filled 2011-01-30: qty 1

## 2011-01-30 MED ORDER — SODIUM CHLORIDE 0.9 % IV SOLN
INTRAVENOUS | Status: AC
  Administered 2011-01-30: 15:00:00 via INTRAVENOUS

## 2011-01-30 MED ORDER — WARFARIN SODIUM 5 MG PO TABS
5.0000 mg | ORAL_TABLET | Freq: Once | ORAL | Status: AC
  Administered 2011-01-30: 5 mg via ORAL
  Filled 2011-01-30: qty 1

## 2011-01-30 MED ORDER — FENTANYL CITRATE 0.05 MG/ML IJ SOLN
INTRAMUSCULAR | Status: AC
  Filled 2011-01-30: qty 2

## 2011-01-30 MED ORDER — HEPARIN SODIUM (PORCINE) 5000 UNIT/ML IJ SOLN
5000.0000 [IU] | Freq: Three times a day (TID) | INTRAMUSCULAR | Status: DC
  Administered 2011-01-30 – 2011-01-31 (×3): 5000 [IU] via SUBCUTANEOUS
  Filled 2011-01-30 (×3): qty 1

## 2011-01-30 MED ORDER — VERAPAMIL HCL 2.5 MG/ML IV SOLN
INTRAVENOUS | Status: AC
  Filled 2011-01-30: qty 2

## 2011-01-30 NOTE — Procedures (Signed)
Cardiac Catheterization Procedure Note  Name: [name] MRN: [id number] DOB: May 31, 1945  Procedure: Left Heart Cath, Selective Coronary Angiography, LV angiography  Indication: VT   Procedural Details: The right wrist was prepped, draped, and anesthetized with 1% lidocaine. Using the modified Seldinger technique, a 5 French sheath was introduced into the right radial artery. 3 mg of verapamil was administered through the sheath, weight-based unfractionated heparin was administered intravenously. The [facility] catheter was used to engage the RCA, the JL-3.5 catheter was used to engage the LCA, and the pigtail was used to enter the LV. Catheter exchanges were performed over an exchange length guidewire. There were no immediate procedural complications. A TR band was used for radial hemostasis at the completion of the procedure.  The patient was transferred to the post catheterization recovery area for further monitoring.  Procedural Findings:  Hemodynamics:    AO 116/54    LV 137/12   Coronary angiography:   Coronary dominance: right    Left mainstem: 20% mid-vessel stenosis.     Left anterior descending (LAD): There was a proximal LAD stent with 20% instent restenosis.  There was a small to moderate 1st diagonal originating form the stented segment with ostial 70% stenosis.  There was a mid LAD stent with minimal instent restenosis.  Just proximal to this stent, there was a small 2nd diagonal with 50% ostial stenosis.  The remainder of the LAD had no significant disease.     Left circumflex (LCx): There was a large, branching 1st obtuse marginal.  Beyond the OM1, the AV LCx was small with luminal irregularities.      Right coronary artery (RCA): There were overlapping stents in the proximal to mid RCA with diffuse 20-30% instent restenosis.  There was 40% distal RCA stenosis.    Left ventriculography: EF estimated at 40% with global hypokinesis.   Final Conclusions:  Stents patent, no  significant flow-limiting disease in major vessels.  70% ostial D1 off a stented segment of the LAD.   Recommendations: Medical management.   [name] 01/30/2011, 2:22 PM

## 2011-01-30 NOTE — Consult Note (Signed)
ANTICOAGULATION CONSULT NOTE - Follow Up Consult  Pharmacy Consult for coumadin Indication: atrial fibrillation  Allergies  Allergen Reactions  . Darvon Other (See Comments)    indigestion  . Phenergan Other (See Comments)    hyperactivity  . Plavix (Clopidogrel Bisulfate) Rash    Patient Measurements: Height: 5\' 3"  (160 cm) Weight: 188 lb 4.4 oz (85.4 kg) IBW/kg (Calculated) : 52.4  Adjusted Body Weight: 65.3 kg  Vital Signs: Temp: 97.6 F (36.4 C) (11/09 1134) Temp src: Oral (11/09 1134) BP: 141/81 mmHg (11/09 0800) Pulse Rate: 70  (11/09 1257)  Labs:  Alvira Philips 01/30/11 0539 01/29/11 0510 01/28/11 1140 01/28/11 0620 01/28/11 0152  HGB -- -- -- -- --  HCT -- -- -- -- --  PLT -- -- -- -- --  APTT -- -- -- -- --  LABPROT 18.8* 22.3* -- 37.6* --  INR 1.54* 1.92* -- 3.75* --  HEPARINUNFRC -- -- -- -- --  CREATININE 1.15* 1.09 -- 0.95 --  CKTOTAL -- -- 98 95 159  CKMB -- -- 2.8 3.0 3.1  TROPONINI -- -- <0.30 <0.30 0.34*   Estimated Creatinine Clearance: 51.9 ml/min (by C-G formula based on Cr of 1.15).   Medications:  Scheduled:    . amiodarone  200 mg Oral TID  . aspirin  324 mg Oral Pre-Cath  . carvedilol  50 mg Oral BID WC  . fentaNYL      . furosemide  20 mg Oral Daily  . gabapentin  400 mg Oral BID  . heparin      . heparin  5,000 Units Subcutaneous Q8H  . insulin aspart  0-20 Units Subcutaneous TID WC  . insulin detemir  70 Units Subcutaneous QHS  . midazolam      . nitroGLYCERIN      . potassium chloride  20 mEq Oral Daily  . rosuvastatin  10 mg Oral Daily  . sodium chloride  3 mL Intravenous Q12H  . verapamil      . Vitamin D (Ergocalciferol)  50,000 Units Oral Weekly  . DISCONTD: sodium chloride  3 mL Intravenous Q12H   Infusions:    . sodium chloride    . DISCONTD: sodium chloride 10 mL/hr at 01/29/11 0900  . DISCONTD: sodium chloride    . DISCONTD: sodium chloride 1.006 mL/kg/hr (01/30/11 1200)    Assessment: Ms. Prioleau is a 64YOF  admitted 11/6 due to firing of her ICD found to have polymorphic ventricular tachycardia. Home dose of coumadin 5mg  PO daily that has been held since admission in preparation for cath. INR on presentation was 2.94 and had fallen to 1.54 this morning. Patient went to cath lab this afternoon and coumadin is to be restarted. No bleeding noted. Last available CBC from 11/6 with Hgb/HCT 12.4/37.8, platelets 246. Patient also started on amiodarone this admission. Will restart patient's home dose of coumadin.  Goal of Therapy:  INR 2-3   Plan:  Give coumadin 5mg  PO x 1 today at 1800 Check CBC and INR in am  Laurence Slate, PharmD Candidate 01/30/2011,2:42 PM   Agree with assessment and plan - Bayard Hugger, PharmD

## 2011-01-30 NOTE — Progress Notes (Signed)
Inpatient Diabetes Program Recommendations  AACE/ADA: New Consensus Statement on Inpatient Glycemic Control (2009)  Target Ranges:  Prepandial:   less than 140 mg/dL      Peak postprandial:   less than 180 mg/dL (1-2 hours)      Critically ill patients:  140 - 180 mg/dL   Reason for Visit: CBGs 11/08: 228/ 294/ 250/ 334 mg/dl.  Fasting sugar today: 151 mg/dl (improved after 70 units Levemir last pm)  Inpatient Diabetes Program Recommendations Insulin - Basal: Titrate Levemir prn to achieve Fasting sugars WNL (Pt's home dose Levemir is 50 units bid) Insulin - Meal Coverage: Add Novolog 4 units tid with meals (once pt back on PO diet after procedure today)  Note: Pt's Home DM medications include: Levemir 50 units bid Apidra SSI

## 2011-01-30 NOTE — H&P (View-Only) (Signed)
SUBJECTIVE: The patient is doing well today.  At this time, she denies chest pain, shortness of breath, or any new concerns.   OBJECTIVE: Physical Exam: Filed Vitals:   01/28/11 0300 01/28/11 0400 01/28/11 0500 01/28/11 0758  BP: 148/67 133/63 132/72   Pulse: 67 68 70   Temp:  98 F (36.7 C)  98 F (36.7 C)  TempSrc:    Oral  Resp:      Height:      Weight:   183 lb 13.8 oz (83.4 kg)   SpO2: 96% 94% 95%     Intake/Output Summary (Last 24 hours) at 01/28/11 0841 Last data filed at 01/28/11 0657  Gross per 24 hour  Intake    240 ml  Output    900 ml  Net   -660 ml    Telemetry reveals sinus rhythm with nonsustained VT  GEN- The patient is well appearing, alert and oriented x 3 today.   Head- normocephalic, atraumatic Eyes-  Sclera clear, conjunctiva pink Ears- hearing intact Oropharynx- clear Neck- supple, no JVP Lymph- no cervical lymphadenopathy Lungs- Clear to ausculation bilaterally, normal work of breathing Heart- Regular rate and rhythm, no murmurs, rubs or gallops, PMI not laterally displaced GI- soft, NT, ND, + BS Extremities- no clubbing, cyanosis, trace edema Skin- no rash or lesion Psych- euthymic mood, full affect Neuro- strength and sensation are intact  LABS: Basic Metabolic Panel:  Basename 01/28/11 0620 01/27/11 0950  NA 143 142  K 3.2* 4.4  CL 107 109  CO2 24 20  GLUCOSE 211* 190*  BUN 17 22  CREATININE 0.95 1.01  CALCIUM 9.1 9.2  MG 1.6 --  PHOS -- --   Liver Function Tests: No results found for this basename: AST:2,ALT:2,ALKPHOS:2,BILITOT:2,PROT:2,ALBUMIN:2 in the last 72 hours No results found for this basename: LIPASE:2,AMYLASE:2 in the last 72 hours CBC:  Basename 01/27/11 1114  WBC 7.5  NEUTROABS --  HGB 12.4  HCT 37.8  MCV 86.5  PLT 246   Cardiac Enzymes:  Basename 01/28/11 0620 01/28/11 0152 01/27/11 0950  CKTOTAL 95 159 --  CKMB 3.0 3.1 --  CKMBINDEX -- -- --  TROPONINI <0.30 0.34* <0.30   BNP: No results found  for this basename: POCBNP:3 in the last 72 hours D-Dimer: No results found for this basename: DDIMER:2 in the last 72 hours Hemoglobin A1C: No results found for this basename: HGBA1C in the last 72 hours Fasting Lipid Panel: No results found for this basename: CHOL,HDL,LDLCALC,TRIG,CHOLHDL,LDLDIRECT in the last 72 hours Thyroid Function Tests: No results found for this basename: TSH,T4TOTAL,FREET3,T3FREE,THYROIDAB in the last 72 hours Anemia Panel: No results found for this basename: VITAMINB12,FOLATE,FERRITIN,TIBC,IRON,RETICCTPCT in the last 72 hours  RADIOLOGY: Dg Chest 2 View  01/27/2011  *RADIOLOGY REPORT*  Clinical Data: Pacemaker firing  CHEST - 2 VIEW  Comparison: Chest x-ray of 01/13/2011  Findings: No active infiltrate or effusion is seen.  Mild cardiomegaly is stable.  A permanent pacemaker with AICD lead appears unchanged in position.  There may be mild pulmonary vascular congestion present.  No bony abnormality is seen.  IMPRESSION: Cardiomegaly.  Question mild pulmonary vascular congestion.  Original Report Authenticated By: PAUL D. BARRY, M.D.   Dg Chest 2 View  01/13/2011  *RADIOLOGY REPORT*  Clinical Data: Chest pain.  Internal defibrillator fired. Hypertension and diabetes.  CHEST - 2 VIEW  Comparison: 07/11/2005  Findings: Heart size remains at the upper limit normal.  AICD remains in appropriate position.  Both lungs are clear.  No evidence   of pleural effusion or pneumothorax.  No mass or lymphadenopathy identified.  IMPRESSION: Stable exam.  No active disease.  Original Report Authenticated By: JOHN A. STAHL, M.D.    ASSESSMENT AND PLAN:  1.  VT- she continues to have runs of PMVT  Will stop sotalol and initiate IV amiodarone (risks of amiodarone discussed at length with pt who wishes to retry this medications). 2.  CAD- no ischemic symptoms, given polymorphic nature of VT, I agree with Dr Klein that repeat cath once INR<2 is reasonable. 3.  Afib- hold coumadin for  cath 4.  Ischemic CM- stable with NYHA Class III CHF symptoms,  Continue lasix and monitor 5.  K/Mg- replete.    Joie Hipps, MD 01/28/2011 8:41 AM  

## 2011-01-30 NOTE — Interval H&P Note (Signed)
History and Physical Interval Note:   01/30/2011   1:35 PM   [name]  has presented today for surgery, with the diagnosis of chest pain  The various methods of treatment have been discussed with the patient and family. After consideration of risks, benefits and other options for treatment, the patient has consented to  Procedure(s): LEFT HEART CATHETERIZATION WITH CORONARY ANGIOGRAM as a surgical intervention .  The patients' history has been reviewed, patient examined, no change in status, stable for surgery.  I have reviewed the patients' chart and labs.  Questions were answered to the patient's satisfaction.     [name]  MD

## 2011-01-31 ENCOUNTER — Encounter (HOSPITAL_COMMUNITY): Payer: Self-pay | Admitting: Internal Medicine

## 2011-01-31 DIAGNOSIS — I2510 Atherosclerotic heart disease of native coronary artery without angina pectoris: Secondary | ICD-10-CM

## 2011-01-31 HISTORY — DX: Atherosclerotic heart disease of native coronary artery without angina pectoris: I25.10

## 2011-01-31 LAB — GLUCOSE, CAPILLARY: Glucose-Capillary: 104 mg/dL — ABNORMAL HIGH (ref 70–99)

## 2011-01-31 LAB — PROTIME-INR: INR: 1.79 — ABNORMAL HIGH (ref 0.00–1.49)

## 2011-01-31 LAB — BASIC METABOLIC PANEL
Chloride: 106 mEq/L (ref 96–112)
Creatinine, Ser: 1.05 mg/dL (ref 0.50–1.10)
GFR calc Af Amer: 64 mL/min — ABNORMAL LOW (ref >90)

## 2011-01-31 MED ORDER — ALUM & MAG HYDROXIDE-SIMETH 200-200-20 MG/5ML PO SUSP
15.0000 mL | ORAL | Status: DC | PRN
  Administered 2011-01-31: 30 mL via ORAL
  Filled 2011-01-31: qty 30

## 2011-01-31 MED ORDER — WARFARIN SODIUM 2.5 MG PO TABS
2.5000 mg | ORAL_TABLET | Freq: Once | ORAL | Status: AC
  Administered 2011-01-31: 2.5 mg via ORAL
  Filled 2011-01-31: qty 1

## 2011-01-31 NOTE — Progress Notes (Signed)
SUBJECTIVE: The patient is doing well today.  She continues to have mild left sided lower abdominal "fullness".  At this time, she denies chest pain, shortness of breath, or any new concerns.   OBJECTIVE: Physical Exam: Filed Vitals:   01/30/11 2000 01/31/11 0000 01/31/11 0400 01/31/11 0500  BP: 108/73 129/58 118/60   Pulse:      Temp: 97 F (36.1 C) 98.2 F (36.8 C)    TempSrc: Oral Oral Oral   Resp: 18 16 16    Height:      Weight:    192 lb 3.2 oz (87.181 kg)  SpO2:        Intake/Output Summary (Last 24 hours) at 01/31/11 0830 Last data filed at 01/30/11 2000  Gross per 24 hour  Intake   1300 ml  Output   1000 ml  Net    300 ml    Telemetry reveals atrial pacing, short NSVT overnight x 1  GEN- The patient is well appearing, alert and oriented x 3 today.   Head- normocephalic, atraumatic Eyes-  Sclera clear, conjunctiva pink Ears- hearing intact Oropharynx- clear Neck- supple, no JVP Lymph- no cervical lymphadenopathy Lungs- Clear to ausculation bilaterally, normal work of breathing Heart- irregular rate and rhythm, no murmurs, rubs or gallops, PMI not laterally displaced GI- soft, NT, ND, + BS Extremities- no clubbing, cyanosis, trace edema Skin- no rash or lesion Psych- euthymic mood, full affect Neuro- strength and sensation are intact  LABS: Basic Metabolic Panel:  Basename 01/30/11 1543 01/30/11 0539 01/29/11 0510  NA -- 144 140  K -- 3.7 3.5  CL -- 109 106  CO2 -- 25 23  GLUCOSE -- 163* 251*  BUN -- 22 20  CREATININE 1.05 1.15* --  CALCIUM -- 9.3 9.1  MG -- -- --  PHOS -- -- --   Liver Function Tests: No results found for this basename: AST:2,ALT:2,ALKPHOS:2,BILITOT:2,PROT:2,ALBUMIN:2 in the last 72 hours No results found for this basename: LIPASE:2,AMYLASE:2 in the last 72 hours CBC:  Basename 01/30/11 1543  WBC 7.1  NEUTROABS --  HGB 12.3  HCT 37.8  MCV 86.3  PLT 225   Cardiac Enzymes:  Basename 01/28/11 1140  CKTOTAL 98  CKMB 2.8    CKMBINDEX --  TROPONINI <0.30    ASSESSMENT AND PLAN:  1.  VT- improved with amiodarone  Continue coreg  Her ICD is approaching ERI battery status.  She will need close follow-up in the device clinic  2.  CAD- cath yesterday revealed no obstructive disease,  Continue medical management 3.  Afib- resume coumadin, per pharmacy 4.  Ischemic CM- stable with NYHA Class III CHF symptoms,  Continue lasix and monitor 5.  Abdominal fullness- preceeded initiation of amiodarone,  Will need follow-up with Dr Christella Hartigan in outpatient setting  To telemetry today, Ambulate Probable discharge tomorrow.    Hillis Range, MD 01/31/2011 8:30 AM

## 2011-01-31 NOTE — Discharge Summary (Signed)
Physician Discharge Summary  Patient ID: [name] MRN: [id number] DOB/AGE: 08-07-1945 65 y.o.  Admit date: 01/27/2011 Discharge date: 02/01/2011 Active Problems:  Automatic implantable cardiac defibrillator st judes    ventricular tachycardia-polymorphic  Ischemic cardiomyopathy  Atrial fibrillation  Type II or unspecified type diabetes mellitus without mention of complication, not stated as uncontrolled  Coronary artery disease  Primary Discharge Diagnosis: Ventricular Tachycardia  Secondary Discharge Diagnosis: 1.Coronary Artery Disease 2. Atrial fibrillation 3. Ischemic Cardiomyopathy 4. Abdominal Fullness  Significant Diagnostic Studies:Cardiac Cath 01/30/2011 Final Conclusions: Stents patent, no significant flow-limiting disease in major vessels. 70% ostial D1 off a stented segment of the LAD.   Consults: None  Hospital Course: [name] is a 65 year old patient of Dr. Johney Frame with known history of ischemic heart disease with depressed left ventricular function with an EF of 35-40% with moderate aortic insufficiency and inferior akinesis per echo in 2012. The patient was seen in the emergency room where she presented with an isolated shock from her ICD. This apparently was polymorphic. She also has history of atrial fibrillation and a history of prior strokes and TIA on Coumadin.   As a result of this the patient was admitted for further evaluation and treatment. She was admitted to ICU and cardiac catheterization was planned. Catheterization as above dated 01/30/2011. She had no significant flow-limiting disease in the major vessels as stated was a 70% ostial diagonal one office visit segment of the LAD.  She was started on amiodarone IV loading and converted to by mouth amiodarone during hospitalization. She'll temporary cessation of her Coumadin prior to cardiac catheterization. She was treated for a [place] heart association class III CHF symptoms with IV Lasix.  She improved very well with treatment but did complain of some abdominal fullness. She was seen by Dr. Johney Frame day of discharge and was found to be stable. Medications were reviewed and adjusted.  She is to follow-up with Dr. Shirlee Latch for ongoing assessment and treatment of CAD/CHF; she is to follow-up with coumadin clinic early next week, she is to make appointment with Dr. Amie Critchley for evaluation of abdominal fullness and keep appointment with Dr. Johney Frame, previously scheduled.  Discharge Exam: Blood pressure 134/71, pulse 70, temperature 99.5 F (37.5 C), temperature source Oral, resp. rate 18, height 5\' 3"  (1.6 m), weight 186 lb 11.7 oz (84.7 kg), SpO2 96.00%.  Labs:   Lab Results  Component Value Date   WBC 7.1 01/30/2011   HGB 12.3 01/30/2011   HCT 37.8 01/30/2011   MCV 86.3 01/30/2011   PLT 225 01/30/2011     Lab 01/31/11 0909  NA 142  K 4.3  CL 106  CO2 25  BUN 19  CREATININE 1.05  CALCIUM 9.6  PROT --  BILITOT --  ALKPHOS --  ALT --  AST --  GLUCOSE 141*   Lab Results  Component Value Date   CKTOTAL 98 01/28/2011   CKMB 2.8 01/28/2011   TROPONINI <0.30 01/28/2011    Lab Results  Component Value Date   CHOL 135 01/14/2011   Lab Results  Component Value Date   HDL 41 01/14/2011   Lab Results  Component Value Date   LDLCALC 73 01/14/2011   Lab Results  Component Value Date   TRIG 103 01/14/2011   Lab Results  Component Value Date   CHOLHDL 3.3 01/14/2011   No results found for this basename: LDLDIRECT      Radiology: Dg Chest 2 View  01/27/2011  *RADIOLOGY REPORT*  Clinical  Data: Pacemaker firing  CHEST - 2 VIEW  .  IMPRESSION: Cardiomegaly.  Question mild pulmonary vascular congestion.  Original Report Authenticated By: Juline Patch, M.D.    FOLLOW UP PLANS AND APPOINTMENTS Discharge Orders    Future Appointments: Provider: Department: Dept Phone: Center:   02/18/2011 10:15 AM Raul Del, RN [facility] [phone] None   02/18/2011 10:30  AM Gardiner Rhyme, MD [facility] [phone] [facility]     Future Orders Please Complete By Expires   Diet - low sodium heart healthy      Increase activity slowly        Current Discharge Medication List    CONTINUE these medications which have NOT CHANGED   Details  aspirin 81 MG tablet Take 81 mg by mouth daily.     carvedilol (COREG) 25 MG tablet Take 50 mg by mouth 2 (two) times daily with a meal.      furosemide (LASIX) 20 MG tablet Take 20 mg by mouth daily. For water retention     gabapentin (NEURONTIN) 400 MG capsule Take 400 mg by mouth 2 (two) times daily.      insulin detemir (LEVEMIR) 100 UNIT/ML injection Inject 50 Units into the skin at bedtime. 50 units in the morning and 50 units at bedtime    insulin glulisine (APIDRA) 100 UNIT/ML injection Inject into the skin 3 (three) times daily before meals. Patient is on a sliding scale.    rosuvastatin (CRESTOR) 10 MG tablet Take 10 mg by mouth daily.           Vitamin D, Ergocalciferol, (DRISDOL) 50000 UNITS CAPS Take 50,000 Units by mouth once a week.      warfarin (COUMADIN) 5 MG tablet Take 2.5 mg by mouth daily.      nitroGLYCERIN (NITROSTAT) 0.4 MG SL tablet Place 0.4 mg under the tongue every 5 (five) minutes as needed. For pain        Follow-up Information    Follow up with Hillis Range, MD. (Our office will call you to confirm appointment)    Contact information:   [address], [address] [place] [address] [phone]       Follow up with [name], MD. (Our office will call you to make appointment)    Contact information:   [address] [place] [address] [phone]       Follow up with [facility]. (Our office will call you to confirm follow-up)    Contact information:   [address], [address] [place] [address] [phone]      Make an appointment with Rob Bunting, MD.  (For abdominal fullness)    Contact information:   [address] [place] [address] [phone]            Time spent with patient to include physician time:40 minutes Signed: Joni Reining 02/01/2011, 8:16 AM Co-Sign MD   Co Sign: Hillis Range, MD 02/03/2011 9:05 AM

## 2011-01-31 NOTE — Consult Note (Signed)
ANTICOAGULATION CONSULT NOTE - Follow Up Consult  Pharmacy Consult for coumadin Indication: atrial fibrillation  Allergies  Allergen Reactions  . Darvon Other (See Comments)    indigestion  . Phenergan Other (See Comments)    hyperactivity  . Plavix (Clopidogrel Bisulfate) Rash    Patient Measurements: Height: 5\' 3"  (160 cm) Weight: 192 lb 3.2 oz (87.181 kg) IBW/kg (Calculated) : 52.4  Adjusted Body Weight: 65.3 kg  Vital Signs: Temp: 98.4 F (36.9 C) (11/10 1129) Temp src: Oral (11/10 1129) BP: 125/61 mmHg (11/10 1129) Pulse Rate: 74  (11/10 1129)  Labs:  Basename 01/31/11 0909 01/31/11 0545 01/30/11 1543 01/30/11 0539 01/29/11 0510  HGB -- -- 12.3 -- --  HCT -- -- 37.8 -- --  PLT -- -- 225 -- --  APTT -- -- -- -- --  LABPROT -- 21.1* -- 18.8* 22.3*  INR -- 1.79* -- 1.54* 1.92*  HEPARINUNFRC -- -- -- -- --  CREATININE 1.05 -- 1.05 1.15* --  CKTOTAL -- -- -- -- --  CKMB -- -- -- -- --  TROPONINI -- -- -- -- --   Estimated Creatinine Clearance: 57.4 ml/min (by C-G formula based on Cr of 1.05).   Medications:  Scheduled:     . amiodarone  200 mg Oral TID  . carvedilol  50 mg Oral BID WC  . fentaNYL      . furosemide  20 mg Oral Daily  . gabapentin  400 mg Oral BID  . heparin  5,000 Units Subcutaneous Q8H  . insulin aspart  0-20 Units Subcutaneous TID WC  . insulin detemir  70 Units Subcutaneous QHS  . midazolam      . potassium chloride  20 mEq Oral Daily  . rosuvastatin  10 mg Oral Daily  . sodium chloride  3 mL Intravenous Q12H  . Vitamin D (Ergocalciferol)  50,000 Units Oral Weekly  . warfarin  5 mg Oral ONCE-1800    Assessment: [name] is a 64YOF admitted 11/6 due to firing of her ICD found to have polymorphic ventricular tachycardia. Home dose of coumadin 5mg  PO daily that has been held since admission in preparation for cath.  Patient went to cath lab yesterday afternoon and coumadin was restarted last pm. No bleeding noted. Last available  CBC from 119 stable. Patient also started on amiodarone this admission.   Will decrease warfarin dose d/t Di with amiodarone will inc INR.  Goal of Therapy:  INR 2-3   Plan:  Give coumadin 2.5mg  PO x 1 today at 1800 Check CBC and INR in am  Marcelino Scot, PharmD Candidate 01/31/2011,1:30 PM   Agree with assessment and plan - Bayard Hugger, PharmD

## 2011-02-01 LAB — BASIC METABOLIC PANEL
BUN: 20 mg/dL (ref 6–23)
Chloride: 103 mEq/L (ref 96–112)
GFR calc non Af Amer: 54 mL/min — ABNORMAL LOW (ref >90)
Glucose, Bld: 267 mg/dL — ABNORMAL HIGH (ref 70–99)
Potassium: 4.2 mEq/L (ref 3.5–5.1)

## 2011-02-01 LAB — GLUCOSE, CAPILLARY
Glucose-Capillary: 146 mg/dL — ABNORMAL HIGH (ref 70–99)
Glucose-Capillary: 262 mg/dL — ABNORMAL HIGH (ref 70–99)

## 2011-02-01 LAB — PROTIME-INR: INR: 2.46 — ABNORMAL HIGH (ref 0.00–1.49)

## 2011-02-01 MED ORDER — AMIODARONE HCL 200 MG PO TABS: 200.0000 mg | ORAL_TABLET | Freq: Two times a day (BID) | ORAL | Status: DC

## 2011-02-01 MED ORDER — WARFARIN SODIUM 5 MG PO TABS: 2.0000 mg | ORAL_TABLET | Freq: Every day | ORAL | Status: DC

## 2011-02-01 MED ORDER — CARVEDILOL 25 MG PO TABS: 50.0000 mg | ORAL_TABLET | Freq: Two times a day (BID) | ORAL | Status: DC

## 2011-02-01 NOTE — Progress Notes (Signed)
SUBJECTIVE: The patient is doing well today.  She continues to have mild left sided lower abdominal "fullness" which is unchanged.  At this time, she denies chest pain, shortness of breath, or any new concerns.   OBJECTIVE: Physical Exam: Filed Vitals:   01/31/11 0831 01/31/11 1129 01/31/11 2200 02/01/11 0600  BP: 150/78 125/61 137/68 134/71  Pulse: 82 74 70 70  Temp: 98.6 F (37 C) 98.4 F (36.9 C) 97.6 F (36.4 C) 99.5 F (37.5 C)  TempSrc:  Oral Oral Oral  Resp: 18 18 18 18   Height:      Weight:    186 lb 11.7 oz (84.7 kg)  SpO2: 97% 97% 96% 96%    Intake/Output Summary (Last 24 hours) at 02/01/11 0742 Last data filed at 01/31/11 1700  Gross per 24 hour  Intake    960 ml  Output    800 ml  Net    160 ml    Telemetry reveals atrial pacing, no further VT  GEN- The patient is well appearing, alert and oriented x 3 today.   Head- normocephalic, atraumatic Eyes-  Sclera clear, conjunctiva pink Ears- hearing intact Oropharynx- clear Neck- supple, no JVP Lymph- no cervical lymphadenopathy Lungs- Clear to ausculation bilaterally, normal work of breathing Heart- irregular rate and rhythm, no murmurs, rubs or gallops, PMI not laterally displaced GI- soft, NT, ND, + BS Extremities- no clubbing, cyanosis, trace edema,  Mild erythema of L arm stable at site of IV infiltration Skin- no rash or lesion Psych- euthymic mood, full affect Neuro- strength and sensation are intact  LABS: Basic Metabolic Panel:  Basename 01/31/11 0909 01/30/11 1543 01/30/11 0539  NA 142 -- 144  K 4.3 -- 3.7  CL 106 -- 109  CO2 25 -- 25  GLUCOSE 141* -- 163*  BUN 19 -- 22  CREATININE 1.05 1.05 --  CALCIUM 9.6 -- 9.3  MG -- -- --  PHOS -- -- --   Liver Function Tests: No results found for this basename: AST:2,ALT:2,ALKPHOS:2,BILITOT:2,PROT:2,ALBUMIN:2 in the last 72 hours No results found for this basename: LIPASE:2,AMYLASE:2 in the last 72 hours CBC:  Basename 01/30/11 1543  WBC 7.1    NEUTROABS --  HGB 12.3  HCT 37.8  MCV 86.3  PLT 225   Cardiac Enzymes: No results found for this basename: CKTOTAL:3,CKMB:3,CKMBINDEX:3,TROPONINI:3 in the last 72 hours  ASSESSMENT AND PLAN:  1.  VT- improved with amiodarone  Continue coreg at current dose  Amiodarone 200mg  BID at discharge,  She should follow up with me in 2-3 weeks (She may already have an appointment with me later this month)  Her ICD is approaching ERI battery status.  She will need close follow-up in the device clinic  2.  CAD- cath yesterday revealed no obstructive disease,  Continue medical management 3.  Afib- resume coumadin 2mg  daily at discharge,  She needs follow-up later this week in the coumadin clinic 4.  Ischemic CM- stable with NYHA Class III CHF symptoms,  Continue lasix and monitor       She will need to follow-up with Dr Shirlee Latch for CAD/CHF management in the next few weeks. 5.  Abdominal fullness- preceeded initiation of amiodarone,  Will need follow-up with Dr Christella Hartigan in outpatient setting   Plan to DC home later today    Hillis Range, MD 02/01/2011 7:42 AM

## 2011-02-02 ENCOUNTER — Encounter (HOSPITAL_COMMUNITY): Payer: Self-pay

## 2011-02-02 ENCOUNTER — Telehealth: Payer: Self-pay | Admitting: Cardiology

## 2011-02-02 NOTE — Telephone Encounter (Signed)
Patient states had Carvedilol and Amiodarone medication prescription given on d/c from the hospital last week fill. The pharmacist said that those two medications react with each other and almost did not wanted to dispense  these medications without talking with the doctor. Patient states have not have any reaction taken these medications. Patient is aware that we have not seen any problems taken these two medications. Patient aware to call the office if she has any unusual and new symptoms.

## 2011-02-02 NOTE — Telephone Encounter (Signed)
Pt calling regarding pharmacists tell her that amiodarone and carvedilol react negatively together. Please return pt call to discuss further.

## 2011-02-18 ENCOUNTER — Ambulatory Visit (INDEPENDENT_AMBULATORY_CARE_PROVIDER_SITE_OTHER): Payer: PRIVATE HEALTH INSURANCE | Admitting: Internal Medicine

## 2011-02-18 ENCOUNTER — Encounter: Payer: Self-pay | Admitting: Internal Medicine

## 2011-02-18 ENCOUNTER — Ambulatory Visit (INDEPENDENT_AMBULATORY_CARE_PROVIDER_SITE_OTHER): Payer: PRIVATE HEALTH INSURANCE | Admitting: *Deleted

## 2011-02-18 DIAGNOSIS — I255 Ischemic cardiomyopathy: Secondary | ICD-10-CM

## 2011-02-18 DIAGNOSIS — I2589 Other forms of chronic ischemic heart disease: Secondary | ICD-10-CM

## 2011-02-18 DIAGNOSIS — Z7901 Long term (current) use of anticoagulants: Secondary | ICD-10-CM | POA: Insufficient documentation

## 2011-02-18 DIAGNOSIS — Z95810 Presence of automatic (implantable) cardiac defibrillator: Secondary | ICD-10-CM

## 2011-02-18 DIAGNOSIS — I472 Ventricular tachycardia: Secondary | ICD-10-CM

## 2011-02-18 DIAGNOSIS — I4891 Unspecified atrial fibrillation: Secondary | ICD-10-CM

## 2011-02-18 DIAGNOSIS — I4729 Other ventricular tachycardia: Secondary | ICD-10-CM

## 2011-02-18 LAB — ICD DEVICE OBSERVATION
AL IMPEDENCE ICD: 450 Ohm
CHARGE TIME: 13.2 s
RV LEAD IMPEDENCE ICD: 480 Ohm
TOT-0006: 20121108000000
TOT-0007: 2
TOT-0008: 0
TOT-0009: 1
TOT-0010: 82
TZAT-0013FASTVT: 2
TZAT-0018FASTVT: NEGATIVE
TZAT-0019FASTVT: 7.5 V
TZAT-0020FASTVT: 1 ms
TZON-0003FASTVT: 330 ms
TZON-0004FASTVT: 24
TZON-0004SLOWVT: 30
TZON-0005FASTVT: 6
TZST-0001FASTVT: 2
TZST-0001FASTVT: 5
TZST-0001SLOWVT: 1
TZST-0002SLOWVT: NEGATIVE
TZST-0003FASTVT: 30 J

## 2011-02-18 LAB — CBC WITH DIFFERENTIAL/PLATELET
Basophils Relative: 0.6 % (ref 0.0–3.0)
Eosinophils Relative: 1 % (ref 0.0–5.0)
HCT: 35.7 % — ABNORMAL LOW (ref 36.0–46.0)
Lymphs Abs: 1.7 10*3/uL (ref 0.7–4.0)
MCV: 86.2 fl (ref 78.0–100.0)
Monocytes Absolute: 0.5 10*3/uL (ref 0.1–1.0)
RBC: 4.14 Mil/uL (ref 3.87–5.11)
WBC: 6.9 10*3/uL (ref 4.5–10.5)

## 2011-02-18 LAB — BASIC METABOLIC PANEL
Chloride: 109 mEq/L (ref 96–112)
Potassium: 3.9 mEq/L (ref 3.5–5.1)

## 2011-02-18 LAB — HEPATIC FUNCTION PANEL
ALT: 15 U/L (ref 0–35)
Total Protein: 7.3 g/dL (ref 6.0–8.3)

## 2011-02-18 LAB — TSH: TSH: 1.42 u[IU]/mL (ref 0.35–5.50)

## 2011-02-18 LAB — T4, FREE: Free T4: 1.01 ng/dL (ref 0.60–1.60)

## 2011-02-18 MED ORDER — WARFARIN SODIUM 2.5 MG PO TABS: 2.5000 mg | ORAL_TABLET | ORAL | Status: DC

## 2011-02-18 NOTE — Assessment & Plan Note (Signed)
No ischemic symptoms CHF appears stable on exam Check BMET today She has not tolerated diovan,  We should consider trying a different ARB or ace inhibitor upon return  No changes

## 2011-02-18 NOTE — Assessment & Plan Note (Addendum)
Controlled with amiodarone Check TFTs, electrolytes, and LFTs today  Decrease amiodarone to 200mg  daily upon return  Normal ICD function See Pace Art report No changes today She is approaching ERI.  She will return for device check in 4-5 weeks.

## 2011-02-18 NOTE — Progress Notes (Signed)
PCP:  Geraldo Pitter, MD  The patient presents today for routine electrophysiology followup.  Since her recent discharge, the patient reports doing very well.  She has had no further VT or ICD shocks.  Today, she denies symptoms of palpitations, chest pain, shortness of breath, orthopnea, PND, lower extremity edema, dizziness, presyncope, syncope, or neurologic sequela.  The patient feels that she is tolerating medications without difficulties and is otherwise without complaint today.   Past Medical History  Diagnosis Date  . Chronic systolic heart failure   . Diabetes mellitus     type 1  . Hypertension   . Stroke 2010    eye doctor said she had TIA  . Ischemic cardiomyopathy   . Ventricular tachycardia     Polymorphic  . 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   . Atrial fibrillation   . Coronary artery disease 01/31/2011  . Chronic renal insufficiency    Past Surgical History  Procedure Date  . Abdominal hysterectomy 1980'S  . Cholecystectomy     1990'S  . Tubaligation 1980  . Icd 2003/2007    implanted by Dr Alanda Amass    Current Outpatient Prescriptions  Medication Sig Dispense Refill  . amiodarone (PACERONE) 200 MG tablet Take 1 tablet (200 mg total) by mouth 2 (two) times daily.  60 tablet  6  . aspirin 81 MG tablet Take 81 mg by mouth daily.       . carvedilol (COREG) 25 MG tablet Take 2 tablets (50 mg total) by mouth 2 (two) times daily with a meal.  60 tablet  6  . Cholecalciferol (VITAMIN D3) 2000 UNITS TABS Take 1 tablet by mouth daily.        . furosemide (LASIX) 20 MG tablet Take 20 mg by mouth daily. For water retention       . gabapentin (NEURONTIN) 400 MG capsule Take 400 mg by mouth 2 (two) times daily.        . insulin detemir (LEVEMIR) 100 UNIT/ML injection Inject 50 Units into the skin at bedtime. 50 units in the morning and 50 units at bedtime      . insulin glulisine (APIDRA) 100 UNIT/ML injection Inject into the skin 3 (three) times daily before  meals. Patient is on a sliding scale.      . nitroGLYCERIN (NITROSTAT) 0.4 MG SL tablet Place 0.4 mg under the tongue every 5 (five) minutes as needed. For pain       . rosuvastatin (CRESTOR) 10 MG tablet Take 10 mg by mouth daily.        . Vitamin D, Ergocalciferol, (DRISDOL) 50000 UNITS CAPS Take 50,000 Units by mouth once a week.        . warfarin (COUMADIN) 2.5 MG tablet Take 1 tablet (2.5 mg total) by mouth as directed.  30 tablet  2    Allergies  Allergen Reactions  . Darvon Other (See Comments)    indigestion  . Phenergan Other (See Comments)    hyperactivity  . Plavix (Clopidogrel Bisulfate) Rash    History   Social History  . Marital Status: Divorced    Spouse Name: N/A    Number of Children: N/A  . Years of Education: N/A   Occupational History  . Not on file.   Social History Main Topics  . Smoking status: Former Smoker    Quit date: 08/16/1995  . Smokeless tobacco: Not on file  . Alcohol Use: No  . Drug Use: No  . Sexually Active: Not on  file   Other Topics Concern  . Not on file   Social History Narrative  . No narrative on file    Physical Exam: Filed Vitals:   02/18/11 1100  BP: 140/71  Pulse: 69  Resp: 18  Height: 5\' 3"  (1.6 m)  Weight: 188 lb (85.276 kg)    GEN- The patient is overweight appearing, alert and oriented x 3 today.   Head- normocephalic, atraumatic Eyes-  Sclera clear, conjunctiva pink Ears- hearing intact Oropharynx- clear Neck- supple, no JVP Lymph- no cervical lymphadenopathy Lungs- Clear to ausculation bilaterally, normal work of breathing Chest- ICD pocket is well healed Heart- Regular rate and rhythm, no murmurs, rubs or gallops, PMI not laterally displaced GI- soft, NT, ND, + BS Extremities- no clubbing, cyanosis,trace edema MS- no significant deformity or atrophy Skin- no rash or lesion Psych- euthymic mood, full affect Neuro- strength and sensation are intact  ICD interrogation- reviewed in detail today,  See  [facility] report  Assessment and Plan:

## 2011-02-18 NOTE — Assessment & Plan Note (Signed)
Coumadin (goal INR 2-3)

## 2011-02-18 NOTE — Patient Instructions (Signed)
Your physician recommends that you schedule a follow-up appointment in: 4-5 weeks with Dr Johney Frame  Your physician recommends that you return for lab work today

## 2011-02-19 ENCOUNTER — Encounter: Payer: Self-pay | Admitting: Cardiology

## 2011-02-20 ENCOUNTER — Encounter: Payer: Self-pay | Admitting: Cardiology

## 2011-02-20 ENCOUNTER — Ambulatory Visit (INDEPENDENT_AMBULATORY_CARE_PROVIDER_SITE_OTHER): Payer: PRIVATE HEALTH INSURANCE | Admitting: Cardiology

## 2011-02-20 DIAGNOSIS — I2589 Other forms of chronic ischemic heart disease: Secondary | ICD-10-CM

## 2011-02-20 DIAGNOSIS — I4729 Other ventricular tachycardia: Secondary | ICD-10-CM

## 2011-02-20 DIAGNOSIS — I5022 Chronic systolic (congestive) heart failure: Secondary | ICD-10-CM

## 2011-02-20 DIAGNOSIS — I255 Ischemic cardiomyopathy: Secondary | ICD-10-CM

## 2011-02-20 DIAGNOSIS — I2510 Atherosclerotic heart disease of native coronary artery without angina pectoris: Secondary | ICD-10-CM

## 2011-02-20 DIAGNOSIS — I4891 Unspecified atrial fibrillation: Secondary | ICD-10-CM

## 2011-02-20 DIAGNOSIS — I472 Ventricular tachycardia, unspecified: Secondary | ICD-10-CM

## 2011-02-20 MED ORDER — LISINOPRIL 5 MG PO TABS: 5.0000 mg | ORAL_TABLET | Freq: Every day | ORAL | Status: DC

## 2011-02-20 NOTE — Patient Instructions (Signed)
Start lisinopril 5mg  daily.  Your physician recommends that you return for lab work in: 2 weeks--BMET/BNP 414.01  428.22   Your physician recommends that you schedule a follow-up appointment in: 2 months with Dr Shirlee Latch.

## 2011-02-22 NOTE — Assessment & Plan Note (Signed)
On amiodarone with suppression of VT.  Recent LFTs and TSH were normal.  Patient will need yearly eye exam.  Plan will be to decrease amiodarone to 200 mg once daily when she next sees Dr. Johney Frame.

## 2011-02-22 NOTE — Assessment & Plan Note (Signed)
Nonobstructive disease on recent cath.  Continue ASA 81 and Crestor with goal LDL < 70.

## 2011-02-22 NOTE — Assessment & Plan Note (Signed)
Continue warfarin.  Given history of CVA, needs Lovenox bridge if she has to stop warfarin at any point of time.  Maintaining NSR with amiodarone.

## 2011-02-22 NOTE — Progress Notes (Signed)
PCP: Dr. Parke Simmers  65 yo with history of CAD, ischemic cardiomyopathy, and atrial fibrillation presents to establish CHF followup.  Patient was recently hospitalized at [facility] for VT with ICD discharge.  She had a left heart cath showing patent stents.  EF was 35-40% by echo.  She was started on amiodarone.  She moved back to [place] recently after living in [place] for a number of years.   Since discharge, she has done well from a cardiopulmonary standpoint.  No exertional dyspnea.  She gets tired but not short of breath when she climbs a flight of steps.  She has been sleeping on 2 pillows at night for a long time, no change recently.  No PND.  No chest pain.  No tachypalpitations or ICD discharge.    Patient has chronic left upper quadrant discomfort.  This has been going on for a long time.  She has been told she has IBS.  The abdominal discomfort was not related to starting amiodarone.    Labs (10/12): LDL 73, HDL 44 Labs (11/12): K 3.9, creatinine 1.1, LFTs normal, TSH normal  PMH: 1. Diabetes mellitus 2. CVA, TIA in 2010 3. HTN 4. CKD 5. H/o TAH 6. H/o CCY 7. Sciatica 8. Atrial fibrillation 9. CAD: s/p LAD and RCA PCI.  Last LHC in 11/12 with patent proximal LAD stent, ostial 70% D1 (jailed by stent), mild LAD stent patent, patent RCA stents, EF 40% with global hypokinesis.  10. Ischemic cardiomyopathy: Echo (10/12): EF 35-40%, moderate focal basal septal hypertrophy, inferior akinesis, grade I diastolic dysfunction, moderate aortic insufficiency. St Jude dual chamber ICD.  11. Aortic insufficiency: moderate.  12. 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   SH: Divorced.  3 children.  Quit smoking in 1997. Lives in [place].  [place].   FH: CAD  ROS: All systems reviewed and negative except as per HPI.   Current Outpatient Prescriptions  Medication Sig Dispense Refill  . amiodarone (PACERONE) 200 MG tablet Take 1 tablet (200 mg total) by mouth 2 (two) times daily.  60 tablet   6  . aspirin 81 MG tablet Take 81 mg by mouth daily.       . carvedilol (COREG) 25 MG tablet Take 2 tablets (50 mg total) by mouth 2 (two) times daily with a meal.  60 tablet  6  . Cholecalciferol (VITAMIN D3) 2000 UNITS TABS Take 1 tablet by mouth daily.        . furosemide (LASIX) 20 MG tablet Take 20 mg by mouth daily. For water retention       . gabapentin (NEURONTIN) 400 MG capsule Take 400 mg by mouth 2 (two) times daily.        . insulin detemir (LEVEMIR) 100 UNIT/ML injection Inject 50 Units into the skin at bedtime. 50 units in the morning and 50 units at bedtime      . insulin glulisine (APIDRA) 100 UNIT/ML injection Inject into the skin 3 (three) times daily before meals. Patient is on a sliding scale.      . nitroGLYCERIN (NITROSTAT) 0.4 MG SL tablet Place 0.4 mg under the tongue every 5 (five) minutes as needed. For pain       . rosuvastatin (CRESTOR) 10 MG tablet Take 10 mg by mouth daily.        . Vitamin D, Ergocalciferol, (DRISDOL) 50000 UNITS CAPS Take 50,000 Units by mouth once a week.        . warfarin (COUMADIN) 2.5 MG tablet Take 1  tablet (2.5 mg total) by mouth as directed.  30 tablet  2  . lisinopril (PRINIVIL,ZESTRIL) 5 MG tablet Take 1 tablet (5 mg total) by mouth daily.  30 tablet  6    BP 126/74  Pulse 74  Ht 5\' 3"  (1.6 m)  Wt 85.276 kg (188 lb)  BMI 33.30 kg/m2 General: NAD, overweight Neck: No JVD, no thyromegaly or thyroid nodule.  Lungs: Clear to auscultation bilaterally with normal respiratory effort. CV: Nondisplaced PMI.  Heart regular S1/S2, no S3/S4, 1/6 SEM.  No peripheral edema.  No carotid bruit.  Normal pedal pulses.  Abdomen: Soft, nontender, no hepatosplenomegaly, no distention.  Skin: Intact without lesions or rashes.  Neurologic: Alert and oriented x 3.  Psych: Normal affect. Extremities: No clubbing or cyanosis.  HEENT: Normal.    ROS: All systems reviewed and negative except as per HPI.

## 2011-02-22 NOTE — Assessment & Plan Note (Signed)
Moderate systolic dysfunction with NYHA class II symptoms.  She does not look volume overloaded on exam.  She has an ICD.  I will have her continue Coreg 25 mg bid and current Lasix dose.  Today, I will start her on lisinopril 5 mg daily.  BMET/BNP in 2 weeks.

## 2011-02-25 ENCOUNTER — Ambulatory Visit (INDEPENDENT_AMBULATORY_CARE_PROVIDER_SITE_OTHER): Payer: PRIVATE HEALTH INSURANCE | Admitting: *Deleted

## 2011-02-25 DIAGNOSIS — I4891 Unspecified atrial fibrillation: Secondary | ICD-10-CM

## 2011-02-25 DIAGNOSIS — Z7901 Long term (current) use of anticoagulants: Secondary | ICD-10-CM

## 2011-03-06 ENCOUNTER — Ambulatory Visit (INDEPENDENT_AMBULATORY_CARE_PROVIDER_SITE_OTHER): Payer: PRIVATE HEALTH INSURANCE | Admitting: *Deleted

## 2011-03-06 ENCOUNTER — Other Ambulatory Visit: Payer: Self-pay | Admitting: Internal Medicine

## 2011-03-06 ENCOUNTER — Other Ambulatory Visit (INDEPENDENT_AMBULATORY_CARE_PROVIDER_SITE_OTHER): Payer: PRIVATE HEALTH INSURANCE | Admitting: *Deleted

## 2011-03-06 DIAGNOSIS — Z7901 Long term (current) use of anticoagulants: Secondary | ICD-10-CM

## 2011-03-06 DIAGNOSIS — I4891 Unspecified atrial fibrillation: Secondary | ICD-10-CM

## 2011-03-06 DIAGNOSIS — I5022 Chronic systolic (congestive) heart failure: Secondary | ICD-10-CM

## 2011-03-06 DIAGNOSIS — I2510 Atherosclerotic heart disease of native coronary artery without angina pectoris: Secondary | ICD-10-CM

## 2011-03-06 LAB — BASIC METABOLIC PANEL
CO2: 27 mEq/L (ref 19–32)
Calcium: 8.9 mg/dL (ref 8.4–10.5)
Creatinine, Ser: 1.5 mg/dL — ABNORMAL HIGH (ref 0.4–1.2)
Glucose, Bld: 183 mg/dL — ABNORMAL HIGH (ref 70–99)
Sodium: 141 mEq/L (ref 135–145)

## 2011-03-09 ENCOUNTER — Telehealth: Payer: Self-pay | Admitting: *Deleted

## 2011-03-09 MED ORDER — FUROSEMIDE 20 MG PO TABS
ORAL_TABLET | ORAL | Status: DC
Start: 2011-03-09 — End: 2011-04-30

## 2011-03-09 NOTE — Telephone Encounter (Signed)
Notes Recorded by Jacqlyn Krauss, RN on 03/09/2011 at 3:11 PM Talked with pt. She will decrease furosemide to 20mg  every other day. Notes Recorded by [name], MD on 03/08/2011 at 3:42 PM Creatinine is a little higher, decrease Lasix to every other day.

## 2011-03-11 ENCOUNTER — Encounter: Payer: PRIVATE HEALTH INSURANCE | Admitting: *Deleted

## 2011-03-13 ENCOUNTER — Encounter: Payer: PRIVATE HEALTH INSURANCE | Admitting: *Deleted

## 2011-03-13 ENCOUNTER — Ambulatory Visit (INDEPENDENT_AMBULATORY_CARE_PROVIDER_SITE_OTHER): Payer: PRIVATE HEALTH INSURANCE | Admitting: *Deleted

## 2011-03-13 DIAGNOSIS — Z7901 Long term (current) use of anticoagulants: Secondary | ICD-10-CM

## 2011-03-13 DIAGNOSIS — I4891 Unspecified atrial fibrillation: Secondary | ICD-10-CM

## 2011-03-23 ENCOUNTER — Ambulatory Visit (INDEPENDENT_AMBULATORY_CARE_PROVIDER_SITE_OTHER): Payer: PRIVATE HEALTH INSURANCE | Admitting: *Deleted

## 2011-03-23 ENCOUNTER — Encounter: Payer: Self-pay | Admitting: Internal Medicine

## 2011-03-23 ENCOUNTER — Ambulatory Visit (INDEPENDENT_AMBULATORY_CARE_PROVIDER_SITE_OTHER): Payer: PRIVATE HEALTH INSURANCE | Admitting: Internal Medicine

## 2011-03-23 DIAGNOSIS — Z7901 Long term (current) use of anticoagulants: Secondary | ICD-10-CM

## 2011-03-23 DIAGNOSIS — I472 Ventricular tachycardia: Secondary | ICD-10-CM

## 2011-03-23 DIAGNOSIS — I4891 Unspecified atrial fibrillation: Secondary | ICD-10-CM

## 2011-03-23 DIAGNOSIS — I2589 Other forms of chronic ischemic heart disease: Secondary | ICD-10-CM

## 2011-03-23 DIAGNOSIS — I4729 Other ventricular tachycardia: Secondary | ICD-10-CM

## 2011-03-23 DIAGNOSIS — I255 Ischemic cardiomyopathy: Secondary | ICD-10-CM

## 2011-03-23 LAB — ICD DEVICE OBSERVATION
AL AMPLITUDE: 2.6 mv
AL IMPEDENCE ICD: 435 Ohm
BAMS-0001: 180 {beats}/min
BAMS-0003: 75 {beats}/min
TOT-0006: 20121231000000
TOT-0007: 2
TOT-0008: 0
TZAT-0004FASTVT: 8
TZAT-0012FASTVT: 200 ms
TZAT-0013FASTVT: 2
TZAT-0018FASTVT: NEGATIVE
TZAT-0019FASTVT: 7.5 V
TZON-0003FASTVT: 330 ms
TZON-0004FASTVT: 24
TZON-0005SLOWVT: 6
TZON-0010FASTVT: 80 ms
TZST-0001FASTVT: 3
TZST-0001FASTVT: 5
TZST-0001SLOWVT: 1
TZST-0002SLOWVT: NEGATIVE
TZST-0003FASTVT: 36 J
VENTRICULAR PACING ICD: 0 pct

## 2011-03-23 LAB — BASIC METABOLIC PANEL
CO2: 24 mEq/L (ref 19–32)
Calcium: 8.8 mg/dL (ref 8.4–10.5)
Chloride: 107 mEq/L (ref 96–112)
Creatinine, Ser: 1.5 mg/dL — ABNORMAL HIGH (ref 0.4–1.2)
Sodium: 140 mEq/L (ref 135–145)

## 2011-03-23 NOTE — Patient Instructions (Signed)
Your physician recommends that you schedule a follow-up appointment in: 6-8 weeks with Dr Johney Frame   Your physician recommends that you return for lab work today: [facility]   Your physician has recommended you make the following change in your medication:  1) Decrease Amiodarone to 200mg  daily

## 2011-03-23 NOTE — Assessment & Plan Note (Signed)
Maintaining sinus Goal INR 2-3

## 2011-03-23 NOTE — Progress Notes (Signed)
Addended by: Dennis Bast F on: 03/23/2011 09:34 AM   Modules accepted: Orders

## 2011-03-23 NOTE — Progress Notes (Signed)
PCP:  Geraldo Pitter, MD, MD  The patient presents today for routine electrophysiology followup.  She continues to do well.  She has had no further VT or ICD shocks. She is tolerating amiodarone.  She has had ringing in her ears for 2 weeks.  She has had similar episodes previously while not taking amiodarone.  Today, she denies symptoms of palpitations, chest pain, shortness of breath, orthopnea, PND, lower extremity edema, dizziness, presyncope, syncope, or neurologic sequela.  The patient feels that she is tolerating medications without difficulties and is otherwise without complaint today.   Past Medical History  Diagnosis Date  . Chronic systolic heart failure   . Diabetes mellitus     type 1  . Hypertension   . Stroke 2010    eye doctor said she had TIA  . Ischemic cardiomyopathy   . Ventricular tachycardia     Polymorphic  . 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   . Atrial fibrillation   . Coronary artery disease 01/31/2011  . Chronic renal insufficiency    Past Surgical History  Procedure Date  . Abdominal hysterectomy 1980'S  . Cholecystectomy     1990'S  . Tubaligation 1980  . Icd 2003/2007    implanted by Dr Alanda Amass    Current Outpatient Prescriptions  Medication Sig Dispense Refill  . amiodarone (PACERONE) 200 MG tablet Take 1 tablet (200 mg total) by mouth 2 (two) times daily.  60 tablet  6  . aspirin 81 MG tablet Take 81 mg by mouth daily.       . carvedilol (COREG) 25 MG tablet Take 2 tablets (50 mg total) by mouth 2 (two) times daily with a meal.  60 tablet  6  . Cholecalciferol (VITAMIN D3) 2000 UNITS TABS Take 1 tablet by mouth daily.        . furosemide (LASIX) 20 MG tablet Take one tablet every other day  15 tablet  6  . gabapentin (NEURONTIN) 400 MG capsule Take 400 mg by mouth 2 (two) times daily.        . insulin detemir (LEVEMIR) 100 UNIT/ML injection Inject 50 Units into the skin at bedtime. 50 units in the morning and 50 units at bedtime        . insulin glulisine (APIDRA) 100 UNIT/ML injection Inject into the skin 3 (three) times daily before meals. Patient is on a sliding scale.      . Liraglutide (VICTOZA) 18 MG/3ML SOLN Inject 6 mg into the skin daily.        [name] lisinopril (PRINIVIL,ZESTRIL) 5 MG tablet Take 1 tablet (5 mg total) by mouth daily.  30 tablet  6  . nitroGLYCERIN (NITROSTAT) 0.4 MG SL tablet Place 0.4 mg under the tongue every 5 (five) minutes as needed. For pain       . rosuvastatin (CRESTOR) 10 MG tablet Take 10 mg by mouth daily.        . Vitamin D, Ergocalciferol, (DRISDOL) 50000 UNITS CAPS Take 50,000 Units by mouth once a week.        . warfarin (COUMADIN) 2.5 MG tablet Take 1 tablet (2.5 mg total) by mouth as directed.  30 tablet  2    Allergies  Allergen Reactions  . Darvon Other (See Comments)    indigestion  . Phenergan Other (See Comments)    hyperactivity  . Plavix (Clopidogrel Bisulfate) Rash    History   Social History  . Marital Status: Divorced    Spouse Name: N/A  Number of Children: N/A  . Years of Education: N/A   Occupational History  . Not on file.   Social History Main Topics  . Smoking status: Former Smoker    Quit date: 08/16/1995  . Smokeless tobacco: Not on file  . Alcohol Use: No  . Drug Use: No  . Sexually Active: Not on file   Other Topics Concern  . Not on file   Social History Narrative  . No narrative on file    Physical Exam: Filed Vitals:   03/23/11 0849  BP: 98/70  Pulse: 72  Height: 5\' 3"  (1.6 m)  Weight: 195 lb 12.8 oz (88.814 kg)    GEN- The patient is overweight appearing, alert and oriented x 3 today.   Head- normocephalic, atraumatic Eyes-  Sclera clear, conjunctiva pink Ears- hearing intact Oropharynx- clear Neck- supple, no JVP Lymph- no cervical lymphadenopathy Lungs- Clear to ausculation bilaterally, normal work of breathing Chest- ICD pocket is well healed Heart- Regular rate and rhythm, no murmurs, rubs or gallops, PMI not  laterally displaced GI- soft, NT, ND, + BS Extremities- no clubbing, cyanosis,trace edema MS- no significant deformity or atrophy Skin- no rash or lesion Psych- euthymic mood, full affect Neuro- strength and sensation are intact  ICD interrogation- reviewed in detail today,  See PACEART report  Assessment and Plan:

## 2011-03-23 NOTE — Assessment & Plan Note (Signed)
No ischemic symptoms No CHF on exam

## 2011-03-23 NOTE — Assessment & Plan Note (Signed)
No further VT Normal ICD function, not yet at [facility]  Decrease amiodarone to 200mg  daily

## 2011-03-23 NOTE — Progress Notes (Signed)
Addended by: Dennis Bast F on: 03/23/2011 09:24 AM   Modules accepted: Orders

## 2011-03-31 ENCOUNTER — Telehealth: Payer: Self-pay | Admitting: Internal Medicine

## 2011-03-31 NOTE — Telephone Encounter (Signed)
Patient  to come in on 04/29/11 to see Dr Johney Frame

## 2011-03-31 NOTE — Telephone Encounter (Signed)
Pt has reached ERI, was just seen by dr Johney Frame 12-31, can she just be scheduled or does she need o come back in?

## 2011-04-01 ENCOUNTER — Encounter: Payer: PRIVATE HEALTH INSURANCE | Admitting: *Deleted

## 2011-04-01 ENCOUNTER — Encounter: Payer: Self-pay | Admitting: Internal Medicine

## 2011-04-01 NOTE — Telephone Encounter (Signed)
error 

## 2011-04-06 ENCOUNTER — Ambulatory Visit (INDEPENDENT_AMBULATORY_CARE_PROVIDER_SITE_OTHER): Payer: PRIVATE HEALTH INSURANCE | Admitting: *Deleted

## 2011-04-06 DIAGNOSIS — Z7901 Long term (current) use of anticoagulants: Secondary | ICD-10-CM

## 2011-04-06 DIAGNOSIS — I4891 Unspecified atrial fibrillation: Secondary | ICD-10-CM

## 2011-04-06 LAB — POCT INR: INR: 2.2

## 2011-04-08 ENCOUNTER — Telehealth: Payer: Self-pay | Admitting: Internal Medicine

## 2011-04-08 NOTE — Telephone Encounter (Signed)
New Problem     Patient device vibrated on Monday 04/06/11, please return call to patient at hm# reached ERI 03/31/11.  Appnt set 04-29-11

## 2011-04-08 NOTE — Telephone Encounter (Signed)
Patient concerned that she hasn't felt device vibration for a few days.  We know the device is at [facility] and she will be seen 04/29/11.  Patient is in agreement.

## 2011-04-09 ENCOUNTER — Telehealth: Payer: Self-pay | Admitting: Cardiology

## 2011-04-09 MED ORDER — CARVEDILOL 25 MG PO TABS: 50.0000 mg | ORAL_TABLET | Freq: Two times a day (BID) | ORAL | Status: DC

## 2011-04-09 NOTE — Telephone Encounter (Signed)
Carvedilol 25 mg to take twice a day, uses [facility] [address]

## 2011-04-21 ENCOUNTER — Encounter: Payer: Self-pay | Admitting: Cardiology

## 2011-04-21 ENCOUNTER — Ambulatory Visit (INDEPENDENT_AMBULATORY_CARE_PROVIDER_SITE_OTHER): Payer: PRIVATE HEALTH INSURANCE | Admitting: Cardiology

## 2011-04-21 DIAGNOSIS — I2510 Atherosclerotic heart disease of native coronary artery without angina pectoris: Secondary | ICD-10-CM

## 2011-04-21 DIAGNOSIS — I4729 Other ventricular tachycardia: Secondary | ICD-10-CM

## 2011-04-21 DIAGNOSIS — I5022 Chronic systolic (congestive) heart failure: Secondary | ICD-10-CM

## 2011-04-21 DIAGNOSIS — I255 Ischemic cardiomyopathy: Secondary | ICD-10-CM

## 2011-04-21 DIAGNOSIS — I2589 Other forms of chronic ischemic heart disease: Secondary | ICD-10-CM

## 2011-04-21 DIAGNOSIS — I4891 Unspecified atrial fibrillation: Secondary | ICD-10-CM

## 2011-04-21 DIAGNOSIS — R0602 Shortness of breath: Secondary | ICD-10-CM

## 2011-04-21 DIAGNOSIS — I472 Ventricular tachycardia: Secondary | ICD-10-CM

## 2011-04-21 LAB — BASIC METABOLIC PANEL
Calcium: 8.8 mg/dL (ref 8.4–10.5)
GFR: 40.88 mL/min — ABNORMAL LOW (ref >60.00)
Glucose, Bld: 289 mg/dL — ABNORMAL HIGH (ref 70–99)
Potassium: 4.3 mEq/L (ref 3.5–5.1)
Sodium: 140 mEq/L (ref 135–145)

## 2011-04-21 LAB — LIPID PANEL
Cholesterol: 152 mg/dL (ref 0–200)
HDL: 56.5 mg/dL (ref >39.00)
LDL Cholesterol: 82 mg/dL (ref 0–99)
Total CHOL/HDL Ratio: 3
Triglycerides: 70 mg/dL (ref 0.0–149.0)
VLDL: 14 mg/dL (ref 0.0–40.0)

## 2011-04-21 NOTE — Assessment & Plan Note (Signed)
Volume appears stable.  She has gained 5 lbs since last appointment but does not appear volume overloaded . Creatinine was up to 1.5 when last checked.  - Continue Coreg 25 mg bid, Lasix 20 mg qod, and lisinopril 5.   - Check BMET/BNP today.  If creatinine improved, will increase lisinopril to 7.5 mg bid.

## 2011-04-21 NOTE — Assessment & Plan Note (Signed)
On amiodarone with suppression of VT.  Recent LFTs and TSH were normal.  Patient will need yearly eye exam.

## 2011-04-21 NOTE — Assessment & Plan Note (Addendum)
Continue warfarin.  Given history of CVA, needs Lovenox bridge if she has to stop warfarin at any point of time.  Maintaining NSR with amiodarone.  

## 2011-04-21 NOTE — Assessment & Plan Note (Signed)
Nonobstructive disease on recent cath.  Continue ASA 81 and Crestor with goal LDL < 70.  

## 2011-04-21 NOTE — Patient Instructions (Signed)
Your physician recommends that you have lab work today--lipid profile/BMET/BNP.  Your physician recommends that you schedule a follow-up appointment in: 3 months with Dr McLean.   

## 2011-04-21 NOTE — Progress Notes (Signed)
PCP: Dr. Parke Simmers  66 yo with history of CAD, ischemic cardiomyopathy, and atrial fibrillation presents for cardiology followup.  Patient was hospitalized in 11/12 at [facility] for VT with ICD discharge.  She had a left heart cath showing patent stents.  EF was 35-40% by echo.  She was started on amiodarone.  Since discharge, she has done well from a cardiopulmonary standpoint.  No exertional dyspnea.  She gets tired with mild shortness of breath when she climbs a flight of steps.  She has been sleeping on 2 pillows at night for a long time, no change recently.  No PND.  No chest pain.  No tachypalpitations or ICD discharge.  Weight is up 5 lbs.    Patient has chronic left upper quadrant discomfort.  This has been going on for a long time.  She has been told she has IBS.  The abdominal discomfort was not related to starting amiodarone.    She feels like her left arm is swollen.  It appears to be mildly larger.  This arm has the ICD which likely the cause of mild swelling.  She says at her left arm has been mildly larger than her right since she got her ICD.   Labs (10/12): LDL 73, HDL 44 Labs (11/12): K 3.9, creatinine 1.1, LFTs normal, TSH normal Labs (12/12): K 3.9, creatinine 1.5, proBNP 18  PMH: 1. Diabetes mellitus 2. CVA, TIA in 2010 3. HTN 4. CKD 5. H/o TAH 6. H/o CCY 7. Sciatica 8. Atrial fibrillation 9. CAD: s/p LAD and RCA PCI.  Last LHC in 11/12 with patent proximal LAD stent, ostial 70% D1 (jailed by stent), mild LAD stent patent, patent RCA stents, EF 40% with global hypokinesis.  10. Ischemic cardiomyopathy: Echo (10/12): EF 35-40%, moderate focal basal septal hypertrophy, inferior akinesis, grade I diastolic dysfunction, moderate aortic insufficiency. St Jude dual chamber ICD.  11. Aortic insufficiency: moderate.  12. 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   SH: Divorced.  3 children.  Quit smoking in 1997. Lives in [place].  [place].   FH: CAD  ROS: All systems reviewed  and negative except as per HPI.   Current Outpatient Prescriptions  Medication Sig Dispense Refill  . amiodarone (PACERONE) 200 MG tablet Take 1 tablet (200 mg total) by mouth daily.  60 tablet  6  . aspirin 81 MG tablet Take 81 mg by mouth daily.       . carvedilol (COREG) 25 MG tablet Take 2 tablets (50 mg total) by mouth 2 (two) times daily with a meal.  120 tablet  6  . Cholecalciferol (VITAMIN D3) 2000 UNITS TABS Take 1 tablet by mouth daily.        . furosemide (LASIX) 20 MG tablet Take one tablet every other day  15 tablet  6  . gabapentin (NEURONTIN) 400 MG capsule Take 400 mg by mouth 2 (two) times daily.        . insulin detemir (LEVEMIR) 100 UNIT/ML injection Inject 50 Units into the skin at bedtime. 50 units in the morning and 50 units at bedtime      . insulin glulisine (APIDRA) 100 UNIT/ML injection Inject into the skin 3 (three) times daily before meals. Patient is on a sliding scale.      [name] lisinopril (PRINIVIL,ZESTRIL) 5 MG tablet Take 1 tablet (5 mg total) by mouth daily.  30 tablet  6  . nitroGLYCERIN (NITROSTAT) 0.4 MG SL tablet Place 0.4 mg under the tongue every 5 (five)  minutes as needed. For pain       . rosuvastatin (CRESTOR) 10 MG tablet Take 10 mg by mouth daily.        . Vitamin D, Ergocalciferol, (DRISDOL) 50000 UNITS CAPS Take 50,000 Units by mouth once a week.        . warfarin (COUMADIN) 2.5 MG tablet Take 1 tablet (2.5 mg total) by mouth as directed.  30 tablet  2    BP 140/75  Pulse 72  Ht 5\' 3"  (1.6 m)  Wt 87.544 kg (193 lb)  BMI 34.19 kg/m2 General: NAD, overweight Neck: No JVD, no thyromegaly or thyroid nodule.  Lungs: Clear to auscultation bilaterally with normal respiratory effort. CV: Nondisplaced PMI.  Heart regular S1/S2, no S3/S4, 1/6 SEM.  No peripheral edema.  No carotid bruit.  Normal pedal pulses.  Abdomen: Soft, nontender, no hepatosplenomegaly, no distention.  Skin: Intact without lesions or rashes.  Neurologic: Alert and oriented x 3.    Psych: Normal affect. Extremities: No clubbing or cyanosis.  HEENT: Normal.

## 2011-04-23 ENCOUNTER — Other Ambulatory Visit: Payer: Self-pay | Admitting: *Deleted

## 2011-04-23 DIAGNOSIS — I5022 Chronic systolic (congestive) heart failure: Secondary | ICD-10-CM

## 2011-04-23 DIAGNOSIS — I2510 Atherosclerotic heart disease of native coronary artery without angina pectoris: Secondary | ICD-10-CM

## 2011-04-27 ENCOUNTER — Telehealth: Payer: Self-pay | Admitting: Cardiology

## 2011-04-27 NOTE — Telephone Encounter (Signed)
Pt calling re dr Shirlee Latch increasing rx's for , crestor and lisinopril, [facility] [address]

## 2011-04-28 NOTE — Telephone Encounter (Signed)
These were sent on 1/31 by [facility]

## 2011-04-29 ENCOUNTER — Ambulatory Visit (INDEPENDENT_AMBULATORY_CARE_PROVIDER_SITE_OTHER): Payer: PRIVATE HEALTH INSURANCE | Admitting: Internal Medicine

## 2011-04-29 ENCOUNTER — Ambulatory Visit (INDEPENDENT_AMBULATORY_CARE_PROVIDER_SITE_OTHER): Payer: PRIVATE HEALTH INSURANCE | Admitting: *Deleted

## 2011-04-29 ENCOUNTER — Telehealth: Payer: Self-pay | Admitting: Cardiology

## 2011-04-29 ENCOUNTER — Encounter: Payer: Self-pay | Admitting: Internal Medicine

## 2011-04-29 ENCOUNTER — Encounter: Payer: Self-pay | Admitting: *Deleted

## 2011-04-29 ENCOUNTER — Other Ambulatory Visit: Payer: Self-pay | Admitting: *Deleted

## 2011-04-29 ENCOUNTER — Ambulatory Visit: Payer: PRIVATE HEALTH INSURANCE | Admitting: Internal Medicine

## 2011-04-29 DIAGNOSIS — Z7901 Long term (current) use of anticoagulants: Secondary | ICD-10-CM

## 2011-04-29 DIAGNOSIS — I4891 Unspecified atrial fibrillation: Secondary | ICD-10-CM

## 2011-04-29 DIAGNOSIS — I2510 Atherosclerotic heart disease of native coronary artery without angina pectoris: Secondary | ICD-10-CM

## 2011-04-29 DIAGNOSIS — I255 Ischemic cardiomyopathy: Secondary | ICD-10-CM

## 2011-04-29 DIAGNOSIS — I4729 Other ventricular tachycardia: Secondary | ICD-10-CM

## 2011-04-29 DIAGNOSIS — I472 Ventricular tachycardia: Secondary | ICD-10-CM

## 2011-04-29 DIAGNOSIS — Z95810 Presence of automatic (implantable) cardiac defibrillator: Secondary | ICD-10-CM

## 2011-04-29 DIAGNOSIS — I2589 Other forms of chronic ischemic heart disease: Secondary | ICD-10-CM

## 2011-04-29 DIAGNOSIS — I5022 Chronic systolic (congestive) heart failure: Secondary | ICD-10-CM

## 2011-04-29 LAB — ICD DEVICE OBSERVATION
AL THRESHOLD: 0.5 V
ATRIAL PACING ICD: 98 pct
BAMS-0003: 75 {beats}/min
CHARGE TIME: 13.3 s
TOT-0006: 20121231000000
TOT-0007: 2
TOT-0008: 0
TOT-0009: 1
TOT-0010: 82
TZAT-0012FASTVT: 200 ms
TZAT-0013FASTVT: 2
TZAT-0018FASTVT: NEGATIVE
TZAT-0020FASTVT: 1 ms
TZON-0003FASTVT: 330 ms
TZON-0004FASTVT: 24
TZON-0005FASTVT: 6
TZON-0010SLOWVT: 80 ms
TZST-0001FASTVT: 4
TZST-0001FASTVT: 5
TZST-0001SLOWVT: 1
TZST-0003FASTVT: 30 J
TZST-0003FASTVT: 36 J

## 2011-04-29 LAB — PROTIME-INR: Prothrombin Time: 24.4 s — ABNORMAL HIGH (ref 10.2–12.4)

## 2011-04-29 LAB — BASIC METABOLIC PANEL
GFR: 38.01 mL/min — ABNORMAL LOW (ref >60.00)
Potassium: 4.3 mEq/L (ref 3.5–5.1)
Sodium: 140 mEq/L (ref 135–145)

## 2011-04-29 LAB — CBC WITH DIFFERENTIAL/PLATELET
Basophils Relative: 0.3 % (ref 0.0–3.0)
Eosinophils Relative: 2.1 % (ref 0.0–5.0)
HCT: 37.9 % (ref 36.0–46.0)
Hemoglobin: 12.8 g/dL (ref 12.0–15.0)
Lymphs Abs: 1.8 10*3/uL (ref 0.7–4.0)
Monocytes Relative: 8 % (ref 3.0–12.0)
Neutro Abs: 3.4 10*3/uL (ref 1.4–7.7)
RBC: 4.36 Mil/uL (ref 3.87–5.11)
WBC: 5.8 10*3/uL (ref 4.5–10.5)

## 2011-04-29 MED ORDER — LISINOPRIL 5 MG PO TABS: ORAL_TABLET | ORAL | Status: DC

## 2011-04-29 MED ORDER — ROSUVASTATIN CALCIUM 20 MG PO TABS: 20.0000 mg | ORAL_TABLET | Freq: Every day | ORAL | Status: DC

## 2011-04-29 NOTE — Telephone Encounter (Addendum)
crestor and lisinopril were increased at rx was to be called in per pt's call she thinks Monday, still not there, pls call [facility] [address]

## 2011-04-29 NOTE — Assessment & Plan Note (Signed)
Her ICD is at [facility].  She has a SJM 7040 RV lead.  Given recent data suggesting risks of lead wire externalization, we will plan to fluoro her lead at time of the generator change.   I would not plan to replace the lead if it is fluoroscopically normal.  Risks, benefits, and alternatives to ICD pulse generator replacement with possible lead replacement were discussed at length today. The patient  understands that the risks include but are not limited to bleeding, infection, pneumothorax, perforation, tamponade, vascular damage, renal failure, MI, stroke, death, inappropriate shocks, and lead dislodgement and wishes to proceed.  We will proceed next week.  I have discussed the Analyze ST study with her today.  She is willing to speak with my research team.

## 2011-04-29 NOTE — Progress Notes (Signed)
Addended by: Dennis Bast F on: 04/29/2011 06:11 PM   Modules accepted: Orders

## 2011-04-29 NOTE — Patient Instructions (Signed)
Will have generator changed on 05/05/11

## 2011-04-29 NOTE — Assessment & Plan Note (Signed)
No ischemic symptoms euvolemic today

## 2011-04-29 NOTE — Progress Notes (Signed)
PCP:  Geraldo Pitter, MD, MD Primary Cardiologist:  Dr Shirlee Latch  The patient presents today for routine electrophysiology followup.  She continues to do well.  She has had no further VT or ICD shocks.  Her afib is controlled. She is tolerating amiodarone.  Today, she denies symptoms of palpitations, chest pain, shortness of breath, orthopnea, PND, lower extremity edema, dizziness, presyncope, syncope, or neurologic sequela.  The patient feels that she is tolerating medications without difficulties and is otherwise without complaint today.   Past Medical History  Diagnosis Date  . Chronic systolic heart failure   . Diabetes mellitus     type 1  . Hypertension   . Stroke 2010    eye doctor said she had TIA  . Ischemic cardiomyopathy   . Ventricular tachycardia     Polymorphic  . 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   . Atrial fibrillation     controlled with amiodarone, on coumadin  . Coronary artery disease 01/31/2011  . Chronic renal insufficiency    Past Surgical History  Procedure Date  . Abdominal hysterectomy 1980'S  . Cholecystectomy     1990'S  . Tubaligation 1980  . Icd 2003/2007    implanted by Dr Alanda Amass    Current Outpatient Prescriptions  Medication Sig Dispense Refill  . amiodarone (PACERONE) 200 MG tablet Take 1 tablet (200 mg total) by mouth daily.  60 tablet  6  . aspirin 81 MG tablet Take 81 mg by mouth daily.       . carvedilol (COREG) 25 MG tablet Take 2 tablets (50 mg total) by mouth 2 (two) times daily with a meal.  120 tablet  6  . Cholecalciferol (VITAMIN D3) 2000 UNITS TABS Take 1 tablet by mouth daily.        . furosemide (LASIX) 20 MG tablet Take one tablet every other day  15 tablet  6  . gabapentin (NEURONTIN) 400 MG capsule Take 400 mg by mouth 2 (two) times daily.        . insulin detemir (LEVEMIR) 100 UNIT/ML injection Inject 55 Units into the skin 2 (two) times daily. 50 units in the morning and 50 units at bedtime      . insulin  glulisine (APIDRA) 100 UNIT/ML injection Inject into the skin 3 (three) times daily before meals. Patient is on a sliding scale.      [name] lisinopril (PRINIVIL,ZESTRIL) 5 MG tablet Take 1.5 tabs daily (7.5mg )  30 tablet  6  . nitroGLYCERIN (NITROSTAT) 0.4 MG SL tablet Place 0.4 mg under the tongue every 5 (five) minutes as needed. For pain       . rosuvastatin (CRESTOR) 20 MG tablet Take 1 tablet (20 mg total) by mouth daily.  30 tablet  1  . Vitamin D, Ergocalciferol, (DRISDOL) 50000 UNITS CAPS Take 50,000 Units by mouth once a week.        . warfarin (COUMADIN) 2.5 MG tablet Take 1 tablet (2.5 mg total) by mouth as directed.  30 tablet  2    Allergies  Allergen Reactions  . Darvon Other (See Comments)    indigestion  . Phenergan Other (See Comments)    hyperactivity  . Plavix (Clopidogrel Bisulfate) Rash    History   Social History  . Marital Status: Divorced    Spouse Name: N/A    Number of Children: N/A  . Years of Education: N/A   Occupational History  . Not on file.   Social History Main Topics  .  Smoking status: Former Smoker    Quit date: 08/16/1995  . Smokeless tobacco: Not on file  . Alcohol Use: No  . Drug Use: No  . Sexually Active: Not on file   Other Topics Concern  . Not on file   Social History Narrative  . No narrative on file    Physical Exam: Filed Vitals:   04/29/11 1220  BP: 118/64  Pulse: 70  Resp: 18  Height: 5\' 3"  (1.6 m)  Weight: 193 lb 12.8 oz (87.907 kg)    GEN- The patient is overweight appearing, alert and oriented x 3 today.   Head- normocephalic, atraumatic Eyes-  Sclera clear, conjunctiva pink Ears- hearing intact Oropharynx- clear Neck- supple, no JVP Lymph- no cervical lymphadenopathy Lungs- Clear to ausculation bilaterally, normal work of breathing Chest- ICD pocket is well healed Heart- Regular rate and rhythm, no murmurs, rubs or gallops, PMI not laterally displaced GI- soft, NT, ND, + BS Extremities- no clubbing,  cyanosis,trace edema MS- no significant deformity or atrophy Skin- no rash or lesion Psych- euthymic mood, full affect Neuro- strength and sensation are intact  ICD interrogation- reviewed in detail today,  See PACEART report  Assessment and Plan:

## 2011-04-29 NOTE — Assessment & Plan Note (Signed)
Well controlled on amiodarone No changes today

## 2011-04-29 NOTE — Progress Notes (Signed)
Addended by: Dennis Bast F on: 04/29/2011 01:42 PM   Modules accepted: Orders

## 2011-04-30 ENCOUNTER — Encounter: Payer: PRIVATE HEALTH INSURANCE | Admitting: Internal Medicine

## 2011-04-30 ENCOUNTER — Telehealth: Payer: Self-pay | Admitting: *Deleted

## 2011-04-30 ENCOUNTER — Encounter (HOSPITAL_COMMUNITY): Payer: Self-pay | Admitting: Pharmacy Technician

## 2011-05-04 ENCOUNTER — Telehealth: Payer: Self-pay | Admitting: *Deleted

## 2011-05-04 MED ORDER — CEFAZOLIN SODIUM-DEXTROSE 2-3 GM-% IV SOLR
2.0000 g | INTRAVENOUS | Status: DC
Start: 2011-05-05 — End: 2011-05-05
  Filled 2011-05-04 (×2): qty 50

## 2011-05-04 MED ORDER — SODIUM CHLORIDE 0.9 % IV SOLN
INTRAVENOUS | Status: DC
  Administered 2011-05-05: 10:00:00 via INTRAVENOUS

## 2011-05-04 MED ORDER — SODIUM CHLORIDE 0.9 % IR SOLN
80.0000 mg | Status: DC
  Filled 2011-05-04: qty 2

## 2011-05-04 NOTE — Telephone Encounter (Signed)
Patient called in regards procedure 05/05/2011. Patient has written instructions to take 1/2 insulin dose tomorrow morning. Patient states she is unable to take the APIDRA without eating. I spoke with Dennis Bast and patient was instructed to hold the APIDRA prior to the procedure.

## 2011-05-05 ENCOUNTER — Ambulatory Visit (HOSPITAL_COMMUNITY)
Admission: RE | Admit: 2011-05-05 | Discharge: 2011-05-05 | Disposition: A | Discharge status: Home / Self Care | Payer: PRIVATE HEALTH INSURANCE | Source: Ambulatory Visit | Attending: Internal Medicine | Admitting: Internal Medicine

## 2011-05-05 ENCOUNTER — Encounter (HOSPITAL_COMMUNITY)
Admission: RE | Disposition: A | Discharge status: Home / Self Care | Payer: Self-pay | Source: Ambulatory Visit | Attending: Internal Medicine

## 2011-05-05 DIAGNOSIS — Z95810 Presence of automatic (implantable) cardiac defibrillator: Secondary | ICD-10-CM

## 2011-05-05 DIAGNOSIS — I4729 Other ventricular tachycardia: Secondary | ICD-10-CM

## 2011-05-05 DIAGNOSIS — I509 Heart failure, unspecified: Secondary | ICD-10-CM | POA: Insufficient documentation

## 2011-05-05 DIAGNOSIS — I129 Hypertensive chronic kidney disease with stage 1 through stage 4 chronic kidney disease, or unspecified chronic kidney disease: Secondary | ICD-10-CM | POA: Insufficient documentation

## 2011-05-05 DIAGNOSIS — N189 Chronic kidney disease, unspecified: Secondary | ICD-10-CM | POA: Insufficient documentation

## 2011-05-05 DIAGNOSIS — Z8673 Personal history of transient ischemic attack (TIA), and cerebral infarction without residual deficits: Secondary | ICD-10-CM | POA: Insufficient documentation

## 2011-05-05 DIAGNOSIS — I255 Ischemic cardiomyopathy: Secondary | ICD-10-CM

## 2011-05-05 DIAGNOSIS — I4891 Unspecified atrial fibrillation: Secondary | ICD-10-CM | POA: Insufficient documentation

## 2011-05-05 DIAGNOSIS — I472 Ventricular tachycardia, unspecified: Secondary | ICD-10-CM | POA: Insufficient documentation

## 2011-05-05 DIAGNOSIS — Z7901 Long term (current) use of anticoagulants: Secondary | ICD-10-CM | POA: Insufficient documentation

## 2011-05-05 DIAGNOSIS — I2589 Other forms of chronic ischemic heart disease: Secondary | ICD-10-CM | POA: Insufficient documentation

## 2011-05-05 DIAGNOSIS — Z4502 Encounter for adjustment and management of automatic implantable cardiac defibrillator: Secondary | ICD-10-CM | POA: Insufficient documentation

## 2011-05-05 DIAGNOSIS — I5022 Chronic systolic (congestive) heart failure: Secondary | ICD-10-CM | POA: Insufficient documentation

## 2011-05-05 DIAGNOSIS — E109 Type 1 diabetes mellitus without complications: Secondary | ICD-10-CM | POA: Insufficient documentation

## 2011-05-05 DIAGNOSIS — I2510 Atherosclerotic heart disease of native coronary artery without angina pectoris: Secondary | ICD-10-CM | POA: Insufficient documentation

## 2011-05-05 DIAGNOSIS — I480 Paroxysmal atrial fibrillation: Secondary | ICD-10-CM

## 2011-05-05 HISTORY — PX: IMPLANTABLE CARDIOVERTER DEFIBRILLATOR (ICD) GENERATOR CHANGE: SHX5469

## 2011-05-05 LAB — PROTIME-INR: INR: 1.89 — ABNORMAL HIGH (ref 0.00–1.49)

## 2011-05-05 LAB — GLUCOSE, CAPILLARY
Glucose-Capillary: 238 mg/dL — ABNORMAL HIGH (ref 70–99)
Glucose-Capillary: 227 mg/dL — ABNORMAL HIGH (ref 70–99)
Glucose-Capillary: 186 mg/dL — ABNORMAL HIGH (ref 70–99)

## 2011-05-05 LAB — SURGICAL PCR SCREEN
MRSA, PCR: NEGATIVE
Staphylococcus aureus: NEGATIVE

## 2011-05-05 SURGERY — ICD GENERATOR CHANGE
Anesthesia: LOCAL

## 2011-05-05 MED ORDER — ACETAMINOPHEN 325 MG PO TABS: 325.0000 mg | ORAL_TABLET | ORAL | Status: DC | PRN

## 2011-05-05 MED ORDER — CHLORHEXIDINE GLUCONATE 4 % EX LIQD
60.0000 mL | Freq: Once | CUTANEOUS | Status: AC
  Administered 2011-05-05: 4 via TOPICAL

## 2011-05-05 MED ORDER — LIDOCAINE HCL (PF) 1 % IJ SOLN
INTRAMUSCULAR | Status: AC
  Filled 2011-05-05: qty 30

## 2011-05-05 MED ORDER — ACETAMINOPHEN 325 MG PO TABS
650.0000 mg | ORAL_TABLET | Freq: Once | ORAL | Status: AC
  Administered 2011-05-05: 650 mg via ORAL

## 2011-05-05 MED ORDER — CEFAZOLIN SODIUM 1-5 GM-% IV SOLN: 1.0000 g | Freq: Once | INTRAVENOUS | Status: DC

## 2011-05-05 MED ORDER — FENTANYL CITRATE 0.05 MG/ML IJ SOLN
INTRAMUSCULAR | Status: AC
  Filled 2011-05-05: qty 2

## 2011-05-05 MED ORDER — INSULIN ASPART 100 UNIT/ML ~~LOC~~ SOLN
0.0000 [IU] | Freq: Three times a day (TID) | SUBCUTANEOUS | Status: DC
  Administered 2011-05-05: 2 [IU] via SUBCUTANEOUS
  Filled 2011-05-05: qty 3

## 2011-05-05 MED ORDER — MUPIROCIN 2 % EX OINT
TOPICAL_OINTMENT | CUTANEOUS | Status: AC
  Administered 2011-05-05: 1 via NASAL
  Filled 2011-05-05: qty 22

## 2011-05-05 MED ORDER — ONDANSETRON HCL 4 MG/2ML IJ SOLN: 4.0000 mg | Freq: Four times a day (QID) | INTRAMUSCULAR | Status: DC | PRN

## 2011-05-05 MED ORDER — MIDAZOLAM HCL 5 MG/5ML IJ SOLN
INTRAMUSCULAR | Status: AC
  Filled 2011-05-05: qty 5

## 2011-05-05 NOTE — Interval H&P Note (Signed)
History and Physical Interval Note:  05/05/2011 10:02 AM  [name]  has presented today for surgery, with the diagnosis of ischemic VT, atrial fibrillation, CAD, and ICD at [facility].  The various methods of treatment have been discussed with the patient and family. After consideration of risks, benefits and other options for treatment, the patient has consented to  Procedure(s) (LRB): ICD GENERATOR CHANGE with possible lead revision as a surgical intervention .  The patients' history has been reviewed, patient examined, no change in status, stable for surgery.  I have reviewed the patients' chart and labs.  Questions were answered to the patient's satisfaction.    Her ICD is at [facility]. She has a SJM 7040 RV lead. Given recent data suggesting risks of lead wire externalization, we will plan to fluoro her lead at time of the generator change. I would not plan to replace the lead unless it is fluoroscopically abnormal. Risks, benefits, and alternatives to ICD pulse generator replacement with possible lead replacement were discussed at length today.  The patient understands that the risks include but are not limited to bleeding, infection, pneumothorax, perforation, tamponade, vascular damage, renal failure, MI, stroke, death, inappropriate shocks, and lead dislodgement and wishes to proceed.  She is willing to enroll and provides consent for Analyze ST study.     Hillis Range

## 2011-05-05 NOTE — Research (Signed)
Analyze St Study  Subject Name: [name]  Subject met inclusion and exclusion criteria.  The informed consent form, study requirements and expectations were reviewed with the subject and questions and concerns were addressed prior to the signing of the consent form.  The subject verbalized understanding of the trail requirements.  The subject agreed to participate in the Analyze St Study trial and signed the informed consent.  The informed consent was obtained prior to performance of any protocol-specific procedures for the subject.  A copy of the signed informed consent was given to the subject and a copy was placed in the subject's medical record.  [name] 04/29/11, 13:40

## 2011-05-05 NOTE — H&P (View-Only) (Signed)
PCP:  BLAND,VEITA J, MD, MD Primary Cardiologist:  Dr McLean  The patient presents today for routine electrophysiology followup.  She continues to do well.  She has had no further VT or ICD shocks.  Her afib is controlled. She is tolerating amiodarone.  Today, she denies symptoms of palpitations, chest pain, shortness of breath, orthopnea, PND, lower extremity edema, dizziness, presyncope, syncope, or neurologic sequela.  The patient feels that she is tolerating medications without difficulties and is otherwise without complaint today.   Past Medical History  Diagnosis Date  . Chronic systolic heart failure   . Diabetes mellitus     type 1  . Hypertension   . Stroke 2010    eye doctor said she had TIA  . Ischemic cardiomyopathy   . Ventricular tachycardia     Polymorphic  . 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   . Atrial fibrillation     controlled with amiodarone, on coumadin  . Coronary artery disease 01/31/2011  . Chronic renal insufficiency    Past Surgical History  Procedure Date  . Abdominal hysterectomy 1980'S  . Cholecystectomy     1990'S  . Tubaligation 1980  . Icd 2003/2007    implanted by Dr Weintraub    Current Outpatient Prescriptions  Medication Sig Dispense Refill  . amiodarone (PACERONE) 200 MG tablet Take 1 tablet (200 mg total) by mouth daily.  60 tablet  6  . aspirin 81 MG tablet Take 81 mg by mouth daily.       . carvedilol (COREG) 25 MG tablet Take 2 tablets (50 mg total) by mouth 2 (two) times daily with a meal.  120 tablet  6  . Cholecalciferol (VITAMIN D3) 2000 UNITS TABS Take 1 tablet by mouth daily.        . furosemide (LASIX) 20 MG tablet Take one tablet every other day  15 tablet  6  . gabapentin (NEURONTIN) 400 MG capsule Take 400 mg by mouth 2 (two) times daily.        . insulin detemir (LEVEMIR) 100 UNIT/ML injection Inject 55 Units into the skin 2 (two) times daily. 50 units in the morning and 50 units at bedtime      . insulin  glulisine (APIDRA) 100 UNIT/ML injection Inject into the skin 3 (three) times daily before meals. Patient is on a sliding scale.      . lisinopril (PRINIVIL,ZESTRIL) 5 MG tablet Take 1.5 tabs daily (7.5mg)  30 tablet  6  . nitroGLYCERIN (NITROSTAT) 0.4 MG SL tablet Place 0.4 mg under the tongue every 5 (five) minutes as needed. For pain       . rosuvastatin (CRESTOR) 20 MG tablet Take 1 tablet (20 mg total) by mouth daily.  30 tablet  1  . Vitamin D, Ergocalciferol, (DRISDOL) 50000 UNITS CAPS Take 50,000 Units by mouth once a week.        . warfarin (COUMADIN) 2.5 MG tablet Take 1 tablet (2.5 mg total) by mouth as directed.  30 tablet  2    Allergies  Allergen Reactions  . Darvon Other (See Comments)    indigestion  . Phenergan Other (See Comments)    hyperactivity  . Plavix (Clopidogrel Bisulfate) Rash    History   Social History  . Marital Status: Divorced    Spouse Name: N/A    Number of Children: N/A  . Years of Education: N/A   Occupational History  . Not on file.   Social History Main Topics  .   Smoking status: Former Smoker    Quit date: 08/16/1995  . Smokeless tobacco: Not on file  . Alcohol Use: No  . Drug Use: No  . Sexually Active: Not on file   Other Topics Concern  . Not on file   Social History Narrative  . No narrative on file    Physical Exam: Filed Vitals:   04/29/11 1220  BP: 118/64  Pulse: 70  Resp: 18  Height: 5' 3" (1.6 m)  Weight: 193 lb 12.8 oz (87.907 kg)    GEN- The patient is overweight appearing, alert and oriented x 3 today.   Head- normocephalic, atraumatic Eyes-  Sclera clear, conjunctiva pink Ears- hearing intact Oropharynx- clear Neck- supple, no JVP Lymph- no cervical lymphadenopathy Lungs- Clear to ausculation bilaterally, normal work of breathing Chest- ICD pocket is well healed Heart- Regular rate and rhythm, no murmurs, rubs or gallops, PMI not laterally displaced GI- soft, NT, ND, + BS Extremities- no clubbing,  cyanosis,trace edema MS- no significant deformity or atrophy Skin- no rash or lesion Psych- euthymic mood, full affect Neuro- strength and sensation are intact  ICD interrogation- reviewed in detail today,  See PACEART report  Assessment and Plan:  

## 2011-05-05 NOTE — Op Note (Signed)
SURGEON:  Hillis Range, MD      PREPROCEDURE DIAGNOSES:   1. Ischemic cardiomyopathy.   2. New York Heart Association class III, heart failure chronically.   3. CAD  4. Ventricular tachycardia     POSTPROCEDURE DIAGNOSES:   1. Ischemic cardiomyopathy.   2. New York Heart Association class III heart failure chronically.   3. CAD  4. Ventricular tachycardia   PROCEDURES:    1. ICD lead evaluation under fluoroscopy.  2. ICD pulse generator replacement with DFT testing  3. Skin pocket revision     INTRODUCTION: [name] is a 66 y.o. 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  She has a SJM 7040 Riata lead. The patient therefore  presents today for lead fluoroscopy with possible revision and ICD pulse generator replacement.      DESCRIPTION OF PROCEDURE:  Informed written consent was obtained and the patient was brought to the electrophysiology lab in the fasting state.  Her previously implanted SJM 7040 Riata lead was thoroughly evaluated with Cine in the RAO, AP, and LAO positions (15 minutes required).  There was no evidence for wire extrusion from the lead.  The device was interrogated and confirmed to be at [place] battery status.  The Riata lead measurements were all satisfactory and stable.  I therefore elected to not replace the lead today.  The patient was adequately sedated with intravenous Versed, and fentanyl as outlined in the nursing report.  The patient's left chest was prepped and draped in the usual sterile fashion by the EP lab staff.  The skin overlying the left deltopectoral region was infiltrated with lidocaine for local analgesia.  A 5-cm incision was made over the existing ICD pocket.  Electrocautery was used to assure hemostasis.  The device was exposed and removed from the pocket. A single silk  stitch was identified and removed which previously secured the device within the pocket. The device was disconnected from the leads.  The leads were examined thoroughly and their integrity confirmed to be intact.  The right atrial lead was confirmed to be a [employer] S, model [id number]  (serial # [id number]) lead implanted 04/08/05.  The right ventricular lead was confirmed to be a [employer], model [id number] (serial number [id number]) right ventricular defibrillator lead also implanted 04/08/05.  Atrial lead P-waves measured 3.6 mV with an impedance of 522 ohms and a threshold of 0.6 volts at 0.5 milliseconds.  The right ventricular lead R-wave measured 10.9 mV with impedance of 434 ohms and a threshold of 0.7 volts at 0.5 milliseconds.  Both leads were then connected to a [employer] ST DR model [id number] ICD.  The pocket was revised to accomodate this new device.  The device was placed more medially due to patient concerns that the device was uncomfortable in its previous lateral position. The pocket was  irrigated with copious gentamicin solution.  The defibrillator was placed into the  Pocket and secured to the pectoralis fascia using #2 silk suture.  The pocket was then closed in 2 layers with 2.0 Vicryl suture  for the subcutaneous and subcuticular layers.  Steri-Strips and a  sterile dressing were then applied.   DFT Testing: Defibrillation Threshold testing was then performed. Ventricular fibrillation was induced with a T shock.  Adequate sensing of ventricular  fibrillation  was observed with no dropout with a programmed sensitivity of 1.76mV.  The patient was successfully defibrillated to sinus rhythm with a single 15 joules shock delivered from the device with an impedance of 44 ohms in a duration of 5 seconds.  The patient remained in sinus rhythm thereafter.  There were no early apparent complications.     CONCLUSIONS:   1. Ischemic cardiomyopathy with chronic New  York Heart Association class III heart failure, CAD, and prior polymorphic ventricular tachycardi.   2. ICD at elective replacement indicator  3. Successful ICD pulse generator replacement   4. DFT less than or equal to 15 joules.   5. No early apparent complications.   Fayrene Fearing Antonea Gaut,MD 11:42 AM 05/05/2011

## 2011-05-05 NOTE — Discharge Instructions (Signed)
Pacemaker Battery Change A pacemaker battery usually lasts 4 to 12 years. Once or twice per year, you will be asked to visit your caregiver to have a full evaluation of your pacemaker. When a battery needs to be replaced, the entire pacemaker is actually replaced so that you can benefit from new circuitry and any new features that have recently been added to pacemakers. Most often, this procedure is very simple because the leads are already in place. After giving medicine to numb the skin, your health care provider makes a cut to reopen the pocket holding the pacemaker and disconnects the old device from its leads. The leads are routinely tested at this time. If they are working okay, the new pacemaker may simply be connected to the existing leads. If there is any problem with the old lead system, it may be wise to replace the lead system while inserting the new pacemaker. There are many things that affect how long a pacemaker battery will last:  Age of the pacemaker.   Number of leads (1, 2 or 3).   Pacemaker work load. If the pacemaker is helping the heart more often, then the battery will not last as long as if the pacemaker does not need to help the heart.   Resistance of the leads. The greater the resistance, the greater the drain on the battery. This can increase as the leads get older or if one or more of the leads does not have the best contact with the heart.   Power (voltage) settings.   The health of the person's heart. If the health of the heart gets worse, then the pacemaker may have to work more often and the setting changed to accommodate these changes.  Your health care provider will be alerted to the fact that it is time to replace the battery during follow-up exams. He or she will check your pacemaker using a small table-top computer, called a programmer, and a wand. The wand is about the same size as a remote control. Your provider puts the wand on your body in the area where the  pacemaker is located. Information from the pacemaker is received about how well your heart is working and the status of the battery. It is not painful, and it usually takes just a few minutes. You will have plenty of time before the battery is fully used up to plan for replacement.  LET YOUR CAREGIVER KNOW ABOUT:   Symptoms of chest pain, trouble breathing, palpitations, lightheadedness, or feelings of an abnormal or irregular heart beat.   Allergies.   Medications taken including herbs, eye drops, over the counter medications, and creams   Use of steroids (by mouth or creams).   Possible pregnancy, if applicable.   Previous problems with anesthetics or Novocaine.   History of blood clots (thrombophlebitis).   History of bleeding or blood problems.   Surgery since your last pacemaker placement.   Other health problems.  RISKS AND COMPLICATIONS These are very uncommon but include:  Bleeding.   Bruising of the skin around where the incision was made.   Pain at the site of the incision.   Pulling apart of the skin at the incision site.   Infection.   Allergic reaction to anesthetics or medicines used during the procedure.  Diabetics may have a temporary increase in their blood sugar after any surgical procedure.  BEFORE THE PROCEDURE  Wash all of the skin around the area of the chest where the pacemaker is located.   Try to remove any loose, scaling skin. Unless advised otherwise, avoid using aspirin, ibuprofen, or naproxen for 3-4 days before the procedure. Ask your caregiver for help with any other medication adjustments before the pacemaker is replaced. Unless advised otherwise, do not eat or drink after midnight on the night before the procedure EXCEPT for drinking water and taking your medications as you normally would. AFTER THE PROCEDURE   A heart monitor and the pacemaker programmer will be used to make sure that the new pacemaker is working properly.   You can go home  after the procedure.   Your caregiver will advise you if you need to have any stitches. They will be removed 5-7 days after the procedure.  HOME CARE INSTRUCTIONS   Keep the incision clean and dry.   Unless advised otherwise, you may shower after carefully covering the incision with plastic wrap that is taped to your chest.   For the first week after the replacement, avoid stretching motions that pull at the incision site and avoid heavy exercise with the arm on the same side as the incision.   Only take over-the-counter or prescription medicines for pain, discomfort, or fever as directed by your caregiver.   Your caregiver will tell you when you will need to next test your pacemaker by telephone or when to return to the office for re-exam and/or removal of stitches, if necessary.  SEEK MEDICAL CARE IF:   You have unusual pain at the incision site that is not adequately helped by over-the-counter or prescription medicine.   There is drainage or pus from the incision site.   You develop red streaking that extends above or below the incision site.   You feel brief intermittent palpitations, lightheadedness or any symptoms that you feel might be related to your heart.  SEEK IMMEDIATE MEDICAL CARE IF:   You experience chest pain that is different than the pain at the incision site.   You experience:   Shortness of breath.   Palpitations.   Irregular heart beat.   Lightheadedness that does not go away quickly.   Fainting.   You develop a fever.   You have pain that gets worse even though you are taking pain medicine.  MAKE SURE YOU:   Understand these instructions.   Will watch your condition.   Will get help right away if you are not doing well or get worse.  Document Released: 06/17/2006 Document Revised: 11/19/2010 Document Reviewed: 09/20/2006 Exit[employer] Patient Information 2012 [employer]. 

## 2011-05-07 ENCOUNTER — Other Ambulatory Visit: Payer: PRIVATE HEALTH INSURANCE | Admitting: *Deleted

## 2011-05-11 ENCOUNTER — Telehealth: Payer: Self-pay | Admitting: *Deleted

## 2011-05-11 NOTE — Telephone Encounter (Signed)
Error--nt 

## 2011-05-16 ENCOUNTER — Other Ambulatory Visit: Payer: Self-pay | Admitting: Cardiology

## 2011-05-22 ENCOUNTER — Encounter: Payer: Self-pay | Admitting: Internal Medicine

## 2011-05-22 ENCOUNTER — Ambulatory Visit (INDEPENDENT_AMBULATORY_CARE_PROVIDER_SITE_OTHER): Payer: PRIVATE HEALTH INSURANCE | Admitting: Internal Medicine

## 2011-05-22 DIAGNOSIS — Z95810 Presence of automatic (implantable) cardiac defibrillator: Secondary | ICD-10-CM

## 2011-05-22 DIAGNOSIS — I472 Ventricular tachycardia: Secondary | ICD-10-CM

## 2011-05-22 DIAGNOSIS — I4729 Other ventricular tachycardia: Secondary | ICD-10-CM

## 2011-05-22 DIAGNOSIS — I4891 Unspecified atrial fibrillation: Secondary | ICD-10-CM

## 2011-05-22 LAB — ICD DEVICE OBSERVATION
AL IMPEDENCE ICD: 462.5 Ohm
ATRIAL PACING ICD: 99 pct
BAMS-0001: 170 {beats}/min
BAMS-0003: 60 {beats}/min
CHARGE TIME: 8.4 s
DEV-0020ICD: NEGATIVE
DEVICE MODEL ICD: 819806
FVT: 0
RV LEAD AMPLITUDE: 11.8 mv
TOT-0008: 0
TZAT-0001SLOWVT: 1
TZAT-0004SLOWVT: 8
TZAT-0012SLOWVT: 200 ms
TZAT-0019SLOWVT: 7.5 V
TZON-0004SLOWVT: 30
TZST-0001SLOWVT: 3
TZST-0003SLOWVT: 30 J
VENTRICULAR PACING ICD: 1 pct

## 2011-05-22 NOTE — Assessment & Plan Note (Signed)
Normal ICD function See Pace Art report No changes today  

## 2011-05-22 NOTE — Progress Notes (Signed)
PCP:  Geraldo Pitter, MD, MD Primary Cardiologist:  Dr Shirlee Latch  The patient presents today for routine electrophysiology followup.  Since her recent generator change, the patient reports doing very well.   She is without complaint today Today, she denies symptoms of palpitations, chest pain, shortness of breath, orthopnea, PND, lower extremity edema, dizziness, presyncope, syncope, or neurologic sequela.  The patient feels that she is tolerating medications without difficulties and is otherwise without complaint today.   Past Medical History  Diagnosis Date  . Chronic systolic heart failure   . Diabetes mellitus     type 1  . Hypertension   . Stroke 2010    eye doctor said she had TIA  . Ischemic cardiomyopathy   . Ventricular tachycardia     Polymorphic  . 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   . Atrial fibrillation     controlled with amiodarone, on coumadin  . Coronary artery disease 01/31/2011  . Chronic renal insufficiency    Past Surgical History  Procedure Date  . Abdominal hysterectomy 1980'S  . Cholecystectomy     1990'S  . Tubaligation 1980  . Icd 2003/2007    implanted by Dr Alanda Amass, most recent generator change 2/13 by Dr Johney Frame, Analyze ST study patient    Current Outpatient Prescriptions  Medication Sig Dispense Refill  . amiodarone (PACERONE) 200 MG tablet Take 1 tablet (200 mg total) by mouth daily.  60 tablet  6  . aspirin 81 MG tablet Take 81 mg by mouth daily.       . carvedilol (COREG) 25 MG tablet Take 50 mg by mouth 2 (two) times daily with a meal.      . Cholecalciferol (VITAMIN D3) 2000 UNITS TABS Take 1 tablet by mouth daily.        . furosemide (LASIX) 20 MG tablet Take 20 mg by mouth every other day.      . gabapentin (NEURONTIN) 400 MG capsule Take 400 mg by mouth 2 (two) times daily.        . insulin detemir (LEVEMIR) 100 UNIT/ML injection Inject 55 Units into the skin 2 (two) times daily.       . insulin glulisine (APIDRA) 100 UNIT/ML  injection Inject into the skin 3 (three) times daily before meals. Patient is on a sliding scale.      [name] lisinopril (PRINIVIL,ZESTRIL) 5 MG tablet Take 7.5 mg by mouth daily.      . nitroGLYCERIN (NITROSTAT) 0.4 MG SL tablet Place 0.4 mg under the tongue every 5 (five) minutes as needed. For chest pain      . rosuvastatin (CRESTOR) 20 MG tablet Take 20 mg by mouth daily.      . Vitamin D, Ergocalciferol, (DRISDOL) 50000 UNITS CAPS Take 50,000 Units by mouth daily.       [name] warfarin (COUMADIN) 2.5 MG tablet Take 2.5 mg by mouth daily.      [name] warfarin (COUMADIN) 2.5 MG tablet TAKE 1 TABLET (2.5 MG TOTAL) BY MOUTH AS DIRECTED.  30 tablet  2    Allergies  Allergen Reactions  . Darvon Other (See Comments)    indigestion  . Phenergan Other (See Comments)    hyperactivity  . Plavix (Clopidogrel Bisulfate) Rash    History   Social History  . Marital Status: Divorced    Spouse Name: N/A    Number of Children: N/A  . Years of Education: N/A   Occupational History  . Not on file.   Social History Main  Topics  . Smoking status: Former Smoker    Quit date: 08/16/1995  . Smokeless tobacco: Not on file  . Alcohol Use: No  . Drug Use: No  . Sexually Active: Not on file   Other Topics Concern  . Not on file   Social History Narrative  . No narrative on file     Physical Exam: Filed Vitals:   05/22/11 1400  BP: 110/60  Pulse: 62  Resp: 18  Height: 5\' 3"  (1.6 m)    GEN- The patient is well appearing, alert and oriented x 3 today.   Head- normocephalic, atraumatic Eyes-  Sclera clear, conjunctiva pink Ears- hearing intact Oropharynx- clear Neck- supple, no JVP Lymph- no cervical lymphadenopathy Lungs- Clear to ausculation bilaterally, normal work of breathing Chest- ICD pocket is well healed Heart- Regular rate and rhythm, no murmurs, rubs or gallops, PMI not laterally displaced GI- soft, NT, ND, + BS Extremities- no clubbing, cyanosis, or edema   ICD interrogation-  reviewed in detail today,  See PACEART report  Assessment and Plan:

## 2011-05-22 NOTE — Patient Instructions (Signed)
Your physician recommends that you schedule a follow-up appointment in 3 months with Dr Allred    

## 2011-05-24 ENCOUNTER — Encounter: Payer: Self-pay | Admitting: Internal Medicine

## 2011-05-28 ENCOUNTER — Ambulatory Visit (INDEPENDENT_AMBULATORY_CARE_PROVIDER_SITE_OTHER): Payer: PRIVATE HEALTH INSURANCE | Admitting: *Deleted

## 2011-05-28 DIAGNOSIS — Z7901 Long term (current) use of anticoagulants: Secondary | ICD-10-CM

## 2011-05-28 DIAGNOSIS — I4891 Unspecified atrial fibrillation: Secondary | ICD-10-CM

## 2011-05-28 LAB — POCT INR: INR: 2.6

## 2011-05-28 NOTE — Progress Notes (Signed)
UR Completed.   Makailah Slavick Jane [phone] 05/28/2011  

## 2011-06-25 ENCOUNTER — Ambulatory Visit (INDEPENDENT_AMBULATORY_CARE_PROVIDER_SITE_OTHER): Payer: PRIVATE HEALTH INSURANCE | Admitting: Pharmacist

## 2011-06-25 DIAGNOSIS — I4891 Unspecified atrial fibrillation: Secondary | ICD-10-CM

## 2011-06-25 DIAGNOSIS — Z7901 Long term (current) use of anticoagulants: Secondary | ICD-10-CM

## 2011-06-25 LAB — POCT INR: INR: 3

## 2011-07-21 ENCOUNTER — Ambulatory Visit (INDEPENDENT_AMBULATORY_CARE_PROVIDER_SITE_OTHER): Payer: PRIVATE HEALTH INSURANCE | Admitting: Cardiology

## 2011-07-21 ENCOUNTER — Encounter: Payer: Self-pay | Admitting: Cardiology

## 2011-07-21 ENCOUNTER — Ambulatory Visit (INDEPENDENT_AMBULATORY_CARE_PROVIDER_SITE_OTHER): Payer: PRIVATE HEALTH INSURANCE | Admitting: *Deleted

## 2011-07-21 VITALS — BP 130/70 | HR 60 | Ht 63.0 in | Wt 202.0 lb

## 2011-07-21 DIAGNOSIS — I2510 Atherosclerotic heart disease of native coronary artery without angina pectoris: Secondary | ICD-10-CM

## 2011-07-21 DIAGNOSIS — I4891 Unspecified atrial fibrillation: Secondary | ICD-10-CM

## 2011-07-21 DIAGNOSIS — I4729 Other ventricular tachycardia: Secondary | ICD-10-CM

## 2011-07-21 DIAGNOSIS — I472 Ventricular tachycardia: Secondary | ICD-10-CM

## 2011-07-21 DIAGNOSIS — I2589 Other forms of chronic ischemic heart disease: Secondary | ICD-10-CM

## 2011-07-21 DIAGNOSIS — I5022 Chronic systolic (congestive) heart failure: Secondary | ICD-10-CM

## 2011-07-21 DIAGNOSIS — I255 Ischemic cardiomyopathy: Secondary | ICD-10-CM

## 2011-07-21 DIAGNOSIS — Z7901 Long term (current) use of anticoagulants: Secondary | ICD-10-CM

## 2011-07-21 MED ORDER — POTASSIUM CHLORIDE CRYS ER 10 MEQ PO TBCR: 10.0000 meq | EXTENDED_RELEASE_TABLET | Freq: Every day | ORAL | Status: DC

## 2011-07-21 MED ORDER — FUROSEMIDE 40 MG PO TABS: 40.0000 mg | ORAL_TABLET | Freq: Every day | ORAL | Status: DC

## 2011-07-21 NOTE — Assessment & Plan Note (Signed)
[name] has gained weight and does appear to have some volume overload.  NYHA class III symptoms.   - Increase Lasix to 40 mg daily and start KCl 10 mEq daily.  - Continue lisinopril 7.5 mg daily and Coreg 50 mg bid.   - BMET/BNP in 10 days.  - Followup in office in 1 month.

## 2011-07-21 NOTE — Assessment & Plan Note (Signed)
Nonobstructive disease on recent cath.  Continue ASA 81 and Crestor with goal LDL < 70.  

## 2011-07-21 NOTE — Assessment & Plan Note (Addendum)
Paroxysmal.  Continue warfarin.  Given history of CVA, needs Lovenox bridge if she has to stop warfarin at any point of time.  Maintaining NSR with amiodarone.  She will need TSH and LFTs given amiodarone use.  She will also need a yearly eye exam.

## 2011-07-21 NOTE — Patient Instructions (Signed)
Increase lasix(furosemide) to 40mg  daily in the morning. You can take two 20mg  tablets daily at the same time in the morning.  Start KCL(potassium) 10 mEq daily.  Your physician recommends that you return for lab work in: 10 days--BMET/BNP  Your physician recommends that you schedule a follow-up appointment in: 1 month with Dr Shirlee Latch.

## 2011-07-21 NOTE — Assessment & Plan Note (Signed)
On amiodarone with suppression of VT.

## 2011-07-21 NOTE — Progress Notes (Signed)
PCP: Dr. Parke Simmers  66 yo with history of CAD, ischemic cardiomyopathy, and atrial fibrillation presents for cardiology followup.  Patient was hospitalized in 11/12 at [facility] for VT with ICD discharge.  She had a left heart cath showing patent stents.  EF was 35-40% by echo.  She was started on amiodarone.  Today, she is a-paced.  Weight is up by 9 lbs since I saw her last.  She reports increased exertional dyspnea recently.  She is short of breath after walking about 100 feet.  No orthopnea or PND.  She feels like she is retaining fluid.  She has had problems with sciatica as well which is limiting her activity.    Labs (10/12): LDL 73, HDL 44 Labs (11/12): K 3.9, creatinine 1.1, LFTs normal, TSH normal Labs (12/12): K 3.9, creatinine 1.5, proBNP 18 Labs (1/13): LDL 82, HDL 57 Labs (2/13): K 4.3, creatinine 1.5  ECG: a-paced, LVH, anterolateral T wave inversions.   PMH: 1. Diabetes mellitus 2. CVA, TIA in 2010 3. HTN 4. CKD 5. H/o TAH 6. H/o CCY 7. Sciatica 8. Atrial fibrillation 9. CAD: s/p LAD and RCA PCI.  Last LHC in 11/12 with patent proximal LAD stent, ostial 70% D1 (jailed by stent), mild LAD stent patent, patent RCA stents, EF 40% with global hypokinesis.  10. Ischemic cardiomyopathy: Echo (10/12): EF 35-40%, moderate focal basal septal hypertrophy, inferior akinesis, grade I diastolic dysfunction, moderate aortic insufficiency. St Jude dual chamber ICD.  11. Aortic insufficiency: moderate.  12. 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   SH: Divorced.  3 children.  Quit smoking in 1997. Lives in [place].  [place].   FH: CAD  ROS: All systems reviewed and negative except as per HPI.   Current Outpatient Prescriptions  Medication Sig Dispense Refill  . amiodarone (PACERONE) 200 MG tablet Take 1 tablet (200 mg total) by mouth daily.  60 tablet  6  . aspirin 81 MG tablet Take 81 mg by mouth daily.       . carvedilol (COREG) 25 MG tablet Take 50 mg by mouth 2 (two) times daily  with a meal.      . Cholecalciferol (VITAMIN D3) 2000 UNITS TABS Take 1 tablet by mouth daily.        [name] gabapentin (NEURONTIN) 400 MG capsule Take 400 mg by mouth 2 (two) times daily.        . insulin detemir (LEVEMIR) 100 UNIT/ML injection Inject 55 Units into the skin 2 (two) times daily.       . insulin glulisine (APIDRA) 100 UNIT/ML injection Inject into the skin 3 (three) times daily before meals. Patient is on a sliding scale.      [name] lisinopril (PRINIVIL,ZESTRIL) 5 MG tablet Take 7.5 mg by mouth daily.      . nitroGLYCERIN (NITROSTAT) 0.4 MG SL tablet Place 0.4 mg under the tongue every 5 (five) minutes as needed. For chest pain      . omeprazole (PRILOSEC) 20 MG capsule Take 20 mg by mouth daily.      . rosuvastatin (CRESTOR) 20 MG tablet Take 20 mg by mouth daily.      . Vitamin D, Ergocalciferol, (DRISDOL) 50000 UNITS CAPS Take 50,000 Units by mouth. 50,000 units on Monday and 2,000 every day      . warfarin (COUMADIN) 2.5 MG tablet TAKE 1 TABLET (2.5 MG TOTAL) BY MOUTH AS DIRECTED.  30 tablet  2  . DISCONTD: furosemide (LASIX) 20 MG tablet Take 20 mg by  mouth every other day.      . furosemide (LASIX) 40 MG tablet Take 1 tablet (40 mg total) by mouth daily.  30 tablet  6  . potassium chloride (K-DUR,KLOR-CON) 10 MEQ tablet Take 1 tablet (10 mEq total) by mouth daily.  30 tablet  6  . DISCONTD: warfarin (COUMADIN) 2.5 MG tablet Take 2.5 mg by mouth daily.        BP 130/70  Pulse 60  Ht 5\' 3"  (1.6 m)  Wt 202 lb (91.627 kg)  BMI 35.78 kg/m2 General: NAD, overweight Neck: Thick, JVP 8-9 cm, no thyromegaly or thyroid nodule.  Lungs: Clear to auscultation bilaterally with normal respiratory effort. CV: Nondisplaced PMI.  Heart regular S1/S2, no S3/S4, 1/6 SEM.  Trace bilateral ankle edema.  No carotid bruit.  Normal pedal pulses.  Abdomen: Soft, nontender, no hepatosplenomegaly, no distention.  Neurologic: Alert and oriented x 3.  Psych: Normal affect. Extremities: No clubbing or  cyanosis.

## 2011-07-22 ENCOUNTER — Other Ambulatory Visit: Payer: Self-pay | Admitting: *Deleted

## 2011-07-22 ENCOUNTER — Telehealth: Payer: Self-pay | Admitting: *Deleted

## 2011-07-22 DIAGNOSIS — I2589 Other forms of chronic ischemic heart disease: Secondary | ICD-10-CM

## 2011-07-22 DIAGNOSIS — I4891 Unspecified atrial fibrillation: Secondary | ICD-10-CM

## 2011-07-22 NOTE — Telephone Encounter (Signed)
[name] H ','<<< Less Detail       Laurey Morale, MD         Sent:  Tue July 21, 2011 11:15 PM                 Message     This patient needs TSH and LFTs added when she gets labs next.

## 2011-07-30 ENCOUNTER — Other Ambulatory Visit (INDEPENDENT_AMBULATORY_CARE_PROVIDER_SITE_OTHER): Payer: PRIVATE HEALTH INSURANCE

## 2011-07-30 DIAGNOSIS — I5022 Chronic systolic (congestive) heart failure: Secondary | ICD-10-CM

## 2011-07-30 DIAGNOSIS — I4891 Unspecified atrial fibrillation: Secondary | ICD-10-CM

## 2011-07-30 DIAGNOSIS — R0602 Shortness of breath: Secondary | ICD-10-CM

## 2011-07-30 DIAGNOSIS — I2510 Atherosclerotic heart disease of native coronary artery without angina pectoris: Secondary | ICD-10-CM

## 2011-07-30 DIAGNOSIS — I2589 Other forms of chronic ischemic heart disease: Secondary | ICD-10-CM

## 2011-07-30 LAB — HEPATIC FUNCTION PANEL
Albumin: 3.8 g/dL (ref 3.5–5.2)
Total Protein: 7.1 g/dL (ref 6.0–8.3)

## 2011-07-30 LAB — TSH: TSH: 1.64 u[IU]/mL (ref 0.35–5.50)

## 2011-07-30 LAB — BASIC METABOLIC PANEL
BUN: 60 mg/dL — ABNORMAL HIGH (ref 6–23)
Calcium: 8.5 mg/dL (ref 8.4–10.5)
GFR: 21.99 mL/min — ABNORMAL LOW (ref >60.00)
Potassium: 3.9 mEq/L (ref 3.5–5.1)
Sodium: 140 mEq/L (ref 135–145)

## 2011-07-31 ENCOUNTER — Other Ambulatory Visit: Payer: Self-pay | Admitting: *Deleted

## 2011-07-31 DIAGNOSIS — I2510 Atherosclerotic heart disease of native coronary artery without angina pectoris: Secondary | ICD-10-CM

## 2011-07-31 DIAGNOSIS — I5022 Chronic systolic (congestive) heart failure: Secondary | ICD-10-CM

## 2011-08-05 ENCOUNTER — Other Ambulatory Visit: Payer: Self-pay | Admitting: Cardiology

## 2011-08-06 ENCOUNTER — Telehealth: Payer: Self-pay | Admitting: Cardiology

## 2011-08-06 NOTE — Telephone Encounter (Signed)
Phoned pt about low BP--pt states no other symptoms--no edema,no SOB, no CP--pt states feels alittle dizzy when she stands up and weak--per dr Shirlee Latch, have pt half coreg, drink fluids, and have her BP checked when she comes in for lab work tomorrow--pt agrees--BP check sched.--nt

## 2011-08-06 NOTE — Telephone Encounter (Signed)
Patient would like a return call [phone]  Patient c/o dizziness, BP 85/45, warm xfer to [facility] triage

## 2011-08-07 ENCOUNTER — Ambulatory Visit (INDEPENDENT_AMBULATORY_CARE_PROVIDER_SITE_OTHER): Payer: PRIVATE HEALTH INSURANCE

## 2011-08-07 ENCOUNTER — Other Ambulatory Visit: Payer: PRIVATE HEALTH INSURANCE

## 2011-08-07 ENCOUNTER — Other Ambulatory Visit: Payer: Self-pay | Admitting: *Deleted

## 2011-08-07 VITALS — BP 100/62 | HR 72 | Ht 63.0 in | Wt 200.1 lb

## 2011-08-07 DIAGNOSIS — I2510 Atherosclerotic heart disease of native coronary artery without angina pectoris: Secondary | ICD-10-CM

## 2011-08-07 DIAGNOSIS — I5022 Chronic systolic (congestive) heart failure: Secondary | ICD-10-CM

## 2011-08-07 DIAGNOSIS — I4891 Unspecified atrial fibrillation: Secondary | ICD-10-CM

## 2011-08-07 LAB — BASIC METABOLIC PANEL
BUN: 52 mg/dL — ABNORMAL HIGH (ref 6–23)
Chloride: 106 mEq/L (ref 96–112)
Creatinine, Ser: 2.1 mg/dL — ABNORMAL HIGH (ref 0.4–1.2)
GFR: 25.58 mL/min — ABNORMAL LOW (ref >60.00)

## 2011-08-07 NOTE — Progress Notes (Signed)
Patient came to office for B/P check.Fowarded to Dr. Shirlee Latch for review.

## 2011-08-14 ENCOUNTER — Other Ambulatory Visit (INDEPENDENT_AMBULATORY_CARE_PROVIDER_SITE_OTHER): Payer: PRIVATE HEALTH INSURANCE

## 2011-08-14 DIAGNOSIS — I4891 Unspecified atrial fibrillation: Secondary | ICD-10-CM

## 2011-08-14 LAB — BASIC METABOLIC PANEL
Calcium: 8.8 mg/dL (ref 8.4–10.5)
GFR: 32.77 mL/min — ABNORMAL LOW (ref >60.00)
Glucose, Bld: 379 mg/dL — ABNORMAL HIGH (ref 70–99)
Potassium: 4 mEq/L (ref 3.5–5.1)
Sodium: 140 mEq/L (ref 135–145)

## 2011-09-02 ENCOUNTER — Encounter: Payer: Self-pay | Admitting: Cardiology

## 2011-09-02 ENCOUNTER — Ambulatory Visit (INDEPENDENT_AMBULATORY_CARE_PROVIDER_SITE_OTHER): Payer: PRIVATE HEALTH INSURANCE | Admitting: Cardiology

## 2011-09-02 ENCOUNTER — Ambulatory Visit (INDEPENDENT_AMBULATORY_CARE_PROVIDER_SITE_OTHER): Payer: PRIVATE HEALTH INSURANCE | Admitting: Pharmacist

## 2011-09-02 VITALS — BP 116/68 | HR 66 | Ht 63.0 in | Wt 203.0 lb

## 2011-09-02 DIAGNOSIS — I4891 Unspecified atrial fibrillation: Secondary | ICD-10-CM

## 2011-09-02 DIAGNOSIS — I4729 Other ventricular tachycardia: Secondary | ICD-10-CM

## 2011-09-02 DIAGNOSIS — I2589 Other forms of chronic ischemic heart disease: Secondary | ICD-10-CM

## 2011-09-02 DIAGNOSIS — I5022 Chronic systolic (congestive) heart failure: Secondary | ICD-10-CM

## 2011-09-02 DIAGNOSIS — Z7901 Long term (current) use of anticoagulants: Secondary | ICD-10-CM

## 2011-09-02 DIAGNOSIS — I2510 Atherosclerotic heart disease of native coronary artery without angina pectoris: Secondary | ICD-10-CM

## 2011-09-02 DIAGNOSIS — I255 Ischemic cardiomyopathy: Secondary | ICD-10-CM

## 2011-09-02 DIAGNOSIS — I472 Ventricular tachycardia: Secondary | ICD-10-CM

## 2011-09-02 LAB — BASIC METABOLIC PANEL
BUN: 40 mg/dL — ABNORMAL HIGH (ref 6–23)
Chloride: 102 mEq/L (ref 96–112)
GFR: 33 mL/min — ABNORMAL LOW (ref >60.00)
Potassium: 4.2 mEq/L (ref 3.5–5.1)
Sodium: 139 mEq/L (ref 135–145)

## 2011-09-02 LAB — CBC WITH DIFFERENTIAL/PLATELET
Basophils Relative: 0.5 % (ref 0.0–3.0)
Eosinophils Relative: 0.8 % (ref 0.0–5.0)
HCT: 36.7 % (ref 36.0–46.0)
Hemoglobin: 12.1 g/dL (ref 12.0–15.0)
Lymphs Abs: 1.4 10*3/uL (ref 0.7–4.0)
MCV: 88.3 fl (ref 78.0–100.0)
Monocytes Absolute: 0.4 10*3/uL (ref 0.1–1.0)
Monocytes Relative: 7.6 % (ref 3.0–12.0)
Platelets: 224 10*3/uL (ref 150.0–400.0)
RBC: 4.15 Mil/uL (ref 3.87–5.11)
WBC: 5.8 10*3/uL (ref 4.5–10.5)

## 2011-09-02 LAB — POCT INR: INR: 3.5

## 2011-09-02 NOTE — Patient Instructions (Addendum)
Stop aspirin.  Your physician recommends that you have lab work today--BMET/CBC  Your physician recommends that you schedule a follow-up appointment in: 3 months with Dr Shirlee Latch.

## 2011-09-03 NOTE — Assessment & Plan Note (Signed)
Nonobstructive disease on recent cath.  As she is on warfarin also with stable CAD, she can stop ASA. Continue statin with goal LDL < 70.

## 2011-09-03 NOTE — Progress Notes (Signed)
Patient ID: [name], female   DOB: Aug 27, 1945, 66 y.o.   MRN: [id number] PCP: Dr. Parke Simmers  66 y.o. with history of CAD, ischemic cardiomyopathy, and atrial fibrillation presents for cardiology followup.  Patient was hospitalized in 11/12 at [facility] for VT with ICD discharge.  She had a left heart cath showing patent stents.  EF was 35-40% by echo.  She was started on amiodarone.    At last appointment, her weight was up and she described increased dyspnea.  Volume status is difficult to ascertain due to her thick neck.  I increased her Lasix to 40 mg bid but creatinine increased to 2.4.  I cut back on her Lasix, currently she is taking it at 20 mg bid.  Creatinine decreased back to her baseline, around 1.7.  She continues to feel sluggish/fatigued.  Weight is stable.  She is rather inactive because of her low back pain with sciatica.  No chest pain.   Labs (10/12): LDL 73, HDL 44 Labs (11/12): K 3.9, creatinine 1.1, LFTs normal, TSH normal Labs (12/12): K 3.9, creatinine 1.5, proBNP 18 Labs (1/13): LDL 82, HDL 57 Labs (2/13): K 4.3, creatinine 1.5 Labs (5/13): creatinine 2.4 => 1.7, LFTs normal, TSH normal, proBNP 18  PMH: 1. Diabetes mellitus 2. CVA, TIA in 2010 3. HTN 4. CKD 5. H/o TAH 6. H/o CCY 7. Sciatica 8. Atrial fibrillation 9. CAD: s/p LAD and RCA PCI.  Last LHC in 11/12 with patent proximal LAD stent, ostial 70% D1 (jailed by stent), mild LAD stent patent, patent RCA stents, EF 40% with global hypokinesis.  10. Ischemic cardiomyopathy: Echo (10/12): EF 35-40%, moderate focal basal septal hypertrophy, inferior akinesis, grade I diastolic dysfunction, moderate aortic insufficiency. St Jude dual chamber ICD.  11. Aortic insufficiency: moderate.  12. 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   SH: Divorced.  3 children.  Quit smoking in 1997. Lives in [place].  [place].   FH: CAD  ROS: All systems reviewed and negative except as per HPI.   Current Outpatient Prescriptions   Medication Sig Dispense Refill  . amiodarone (PACERONE) 200 MG tablet Take 1 tablet (200 mg total) by mouth daily.  60 tablet  6  . carvedilol (COREG) 25 MG tablet Take 1 tablet (25 mg total) by mouth 2 (two) times daily with a meal.      . Cholecalciferol (VITAMIN D3) 2000 UNITS TABS Take 1 tablet by mouth daily.        . furosemide (LASIX) 20 MG tablet Take 20mg  daily--one-half 40mg  tablet daily      . gabapentin (NEURONTIN) 400 MG capsule Take 400 mg by mouth 2 (two) times daily.        . insulin detemir (LEVEMIR) 100 UNIT/ML injection Inject 60 Units into the skin 2 (two) times daily.       . insulin glulisine (APIDRA) 100 UNIT/ML injection Inject into the skin 3 (three) times daily before meals. Patient is on a sliding scale.      [name] lisinopril (PRINIVIL,ZESTRIL) 5 MG tablet Take 7.5 mg by mouth daily.      . nitroGLYCERIN (NITROSTAT) 0.4 MG SL tablet Place 0.4 mg under the tongue every 5 (five) minutes as needed. For chest pain      . omeprazole (PRILOSEC) 20 MG capsule Take 20 mg by mouth daily.      . rosuvastatin (CRESTOR) 20 MG tablet Take 20 mg by mouth daily.      . Vitamin D, Ergocalciferol, (DRISDOL) 50000 UNITS  CAPS Take 50,000 Units by mouth. 50,000 units on Monday and 2,000 every day      . warfarin (COUMADIN) 2.5 MG tablet Take as directed by coumadin clinic  30 tablet  2    BP 116/68  Pulse 66  Ht 5\' 3"  (1.6 m)  Wt 92.08 kg (203 lb)  BMI 35.96 kg/m2  SpO2 97% General: NAD, obese Neck: Thick, JVP difficult but does not seem elevated, no thyromegaly or thyroid nodule.  Lungs: Clear to auscultation bilaterally with normal respiratory effort. CV: Nondisplaced PMI.  Heart regular S1/S2, no S3/S4, 1/6 SEM.  No edema.  No carotid bruit.  Normal pedal pulses.  Abdomen: Soft, nontender, no hepatosplenomegaly, no distention.  Neurologic: Alert and oriented x 3.  Psych: Normal affect. Extremities: No clubbing or cyanosis.

## 2011-09-03 NOTE — Assessment & Plan Note (Signed)
Paroxysmal.  Continue warfarin.  Given history of CVA, needs Lovenox bridge if she has to stop warfarin at any point of time.  Maintaining NSR with amiodarone.  Recent TSH and LFTs normal.  She will also need a yearly eye exam.

## 2011-09-03 NOTE — Assessment & Plan Note (Signed)
NYHA class III symptoms but I do not think she is particularly volume overloaded.  Creatinine rose considerably when I increased her Lasix and I have decreased the dose back down.  BNP was not elevated.  Exam is difficult but she does not seem overloaded on exam.  I think a lot of her symptoms are related to obesity and deconditioning.  She has been very inactive due to her back pain and sciatica.  - Continue current doses of Lasix, Coreg, and lisinopril.  - I think it would be reasonable for evaluation by orthopedics or neurosurgery given her severe, activity-limiting sciatica.  She would be high risk for surgery but epidural injection may be an option (would have to bridge off coumadin).

## 2011-09-03 NOTE — Assessment & Plan Note (Signed)
On amiodarone with suppression of VT.  

## 2011-09-03 NOTE — Telephone Encounter (Signed)
New problem:  Patient states someone just called her. Returning call back.  regarding test results.

## 2011-09-16 ENCOUNTER — Encounter: Payer: Self-pay | Admitting: Internal Medicine

## 2011-09-16 ENCOUNTER — Encounter (INDEPENDENT_AMBULATORY_CARE_PROVIDER_SITE_OTHER): Payer: PRIVATE HEALTH INSURANCE

## 2011-09-16 ENCOUNTER — Ambulatory Visit (INDEPENDENT_AMBULATORY_CARE_PROVIDER_SITE_OTHER): Payer: PRIVATE HEALTH INSURANCE | Admitting: Internal Medicine

## 2011-09-16 ENCOUNTER — Ambulatory Visit (INDEPENDENT_AMBULATORY_CARE_PROVIDER_SITE_OTHER): Payer: PRIVATE HEALTH INSURANCE | Admitting: *Deleted

## 2011-09-16 VITALS — BP 130/66 | Resp 18 | Ht 63.0 in | Wt 202.8 lb

## 2011-09-16 DIAGNOSIS — Z7901 Long term (current) use of anticoagulants: Secondary | ICD-10-CM

## 2011-09-16 DIAGNOSIS — I255 Ischemic cardiomyopathy: Secondary | ICD-10-CM

## 2011-09-16 DIAGNOSIS — I4891 Unspecified atrial fibrillation: Secondary | ICD-10-CM

## 2011-09-16 DIAGNOSIS — I472 Ventricular tachycardia, unspecified: Secondary | ICD-10-CM

## 2011-09-16 DIAGNOSIS — I2589 Other forms of chronic ischemic heart disease: Secondary | ICD-10-CM

## 2011-09-16 DIAGNOSIS — R0989 Other specified symptoms and signs involving the circulatory and respiratory systems: Secondary | ICD-10-CM

## 2011-09-16 DIAGNOSIS — I4729 Other ventricular tachycardia: Secondary | ICD-10-CM

## 2011-09-16 LAB — POCT INR: INR: 3.4

## 2011-09-16 NOTE — Progress Notes (Signed)
PCP: Geraldo Pitter, MD Primary Cardiologist:  Dr Doristine Mango is a 66 y.o. female who presents today for routine electrophysiology followup.  Since last being seen in our clinic, the patient reports doing very well.  Today, she denies symptoms of palpitations, chest pain, shortness of breath,  lower extremity edema, dizziness, presyncope, syncope, or ICD shocks.  The patient is otherwise without complaint today.   Past Medical History  Diagnosis Date  . Chronic systolic heart failure   . Diabetes mellitus     type 1  . Hypertension   . Stroke 2010    eye doctor said she had TIA  . Ischemic cardiomyopathy   . Ventricular tachycardia     Polymorphic  . 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   . Atrial fibrillation     controlled with amiodarone, on coumadin  . Coronary artery disease 01/31/2011  . Chronic renal insufficiency    Past Surgical History  Procedure Date  . Abdominal hysterectomy 1980'S  . Cholecystectomy     1990'S  . Tubaligation 1980  . Icd 2003/2007    implanted by Dr Alanda Amass, most recent generator change 2/13 by Dr Johney Frame, Analyze ST study patient    Current Outpatient Prescriptions  Medication Sig Dispense Refill  . amiodarone (PACERONE) 200 MG tablet Take 1 tablet (200 mg total) by mouth daily.  60 tablet  6  . carvedilol (COREG) 25 MG tablet Take 1 tablet (25 mg total) by mouth 2 (two) times daily with a meal.      . Cholecalciferol (VITAMIN D3) 2000 UNITS TABS Take 1 tablet by mouth daily.        . furosemide (LASIX) 20 MG tablet Take 20mg  daily--one-half 40mg  tablet daily      . gabapentin (NEURONTIN) 400 MG capsule Take 400 mg by mouth 2 (two) times daily.        . insulin detemir (LEVEMIR) 100 UNIT/ML injection Inject 60 Units into the skin 2 (two) times daily.       . insulin glulisine (APIDRA) 100 UNIT/ML injection Inject into the skin 3 (three) times daily before meals. Patient is on a sliding scale.      [name] lisinopril  (PRINIVIL,ZESTRIL) 5 MG tablet Take 7.5 mg by mouth daily.      . nitroGLYCERIN (NITROSTAT) 0.4 MG SL tablet Place 0.4 mg under the tongue every 5 (five) minutes as needed. For chest pain      . omeprazole (PRILOSEC) 20 MG capsule Take 20 mg by mouth daily.      . rosuvastatin (CRESTOR) 20 MG tablet Take 20 mg by mouth daily.      . Vitamin D, Ergocalciferol, (DRISDOL) 50000 UNITS CAPS Take 50,000 Units by mouth. 50,000 units on Monday and 2,000 every day      . warfarin (COUMADIN) 2.5 MG tablet Take as directed by coumadin clinic  30 tablet  2    Physical Exam: Filed Vitals:   09/16/11 1144  BP: 130/66  Resp: 18  Height: 5\' 3"  (1.6 m)  Weight: 202 lb 12.8 oz (91.989 kg)    GEN- The patient is well appearing, alert and oriented x 3 today.   Head- normocephalic, atraumatic Eyes-  Sclera clear, conjunctiva pink Ears- hearing intact Oropharynx- clear Lungs- Clear to ausculation bilaterally, normal work of breathing Chest- ICD pocket is well healed Heart- Regular rate and rhythm, no murmurs, rubs or gallops, PMI not laterally displaced GI- soft, NT, ND, + BS Extremities- no clubbing, cyanosis, or  edema  ICD interrogation- reviewed in detail today,  See PACEART report ekg today reveals A pacing at 70 bpm, PR 246, Qtc 454, LVH, nonspecific St/T changes  Assessment and Plan:

## 2011-09-16 NOTE — Assessment & Plan Note (Signed)
Stable Goal INR 2-3 

## 2011-09-16 NOTE — Assessment & Plan Note (Signed)
Normal ICD function See Pace Art report No changes today   Tolerating amiodarone Dr Shirlee Latch to follow LFTs/ TFTs

## 2011-09-16 NOTE — Patient Instructions (Addendum)
Your physician wants you to follow-up in: 6 months with Dr. Allred. You will receive a reminder letter in the mail two months in advance. If you don't receive a letter, please call our office to schedule the follow-up appointment.  

## 2011-09-16 NOTE — Assessment & Plan Note (Signed)
euvolemic today No changes 

## 2011-09-30 ENCOUNTER — Other Ambulatory Visit: Payer: Self-pay | Admitting: *Deleted

## 2011-09-30 DIAGNOSIS — I4891 Unspecified atrial fibrillation: Secondary | ICD-10-CM

## 2011-09-30 MED ORDER — AMIODARONE HCL 200 MG PO TABS: 200.0000 mg | ORAL_TABLET | Freq: Every day | ORAL | Status: DC

## 2011-10-08 ENCOUNTER — Ambulatory Visit (INDEPENDENT_AMBULATORY_CARE_PROVIDER_SITE_OTHER): Payer: PRIVATE HEALTH INSURANCE | Admitting: *Deleted

## 2011-10-08 DIAGNOSIS — I4891 Unspecified atrial fibrillation: Secondary | ICD-10-CM

## 2011-10-08 DIAGNOSIS — Z7901 Long term (current) use of anticoagulants: Secondary | ICD-10-CM

## 2011-10-08 LAB — POCT INR: INR: 2.1

## 2011-10-28 ENCOUNTER — Other Ambulatory Visit: Payer: Self-pay | Admitting: Family Medicine

## 2011-10-28 DIAGNOSIS — M545 Low back pain, unspecified: Secondary | ICD-10-CM

## 2011-11-01 ENCOUNTER — Other Ambulatory Visit: Payer: PRIVATE HEALTH INSURANCE

## 2011-11-05 ENCOUNTER — Ambulatory Visit (INDEPENDENT_AMBULATORY_CARE_PROVIDER_SITE_OTHER): Payer: PRIVATE HEALTH INSURANCE | Admitting: *Deleted

## 2011-11-05 DIAGNOSIS — Z7901 Long term (current) use of anticoagulants: Secondary | ICD-10-CM

## 2011-11-05 DIAGNOSIS — I4891 Unspecified atrial fibrillation: Secondary | ICD-10-CM

## 2011-11-23 ENCOUNTER — Other Ambulatory Visit: Payer: Self-pay | Admitting: Cardiology

## 2011-12-02 ENCOUNTER — Ambulatory Visit (INDEPENDENT_AMBULATORY_CARE_PROVIDER_SITE_OTHER): Payer: PRIVATE HEALTH INSURANCE | Admitting: Pharmacist

## 2011-12-02 ENCOUNTER — Ambulatory Visit (INDEPENDENT_AMBULATORY_CARE_PROVIDER_SITE_OTHER): Payer: PRIVATE HEALTH INSURANCE | Admitting: Cardiology

## 2011-12-02 ENCOUNTER — Encounter: Payer: Self-pay | Admitting: Cardiology

## 2011-12-02 VITALS — BP 118/66 | HR 65 | Temp 98.0°F | Ht 63.0 in | Wt 205.0 lb

## 2011-12-02 DIAGNOSIS — I2510 Atherosclerotic heart disease of native coronary artery without angina pectoris: Secondary | ICD-10-CM

## 2011-12-02 DIAGNOSIS — I4729 Other ventricular tachycardia: Secondary | ICD-10-CM

## 2011-12-02 DIAGNOSIS — R0683 Snoring: Secondary | ICD-10-CM

## 2011-12-02 DIAGNOSIS — I2589 Other forms of chronic ischemic heart disease: Secondary | ICD-10-CM

## 2011-12-02 DIAGNOSIS — Z7901 Long term (current) use of anticoagulants: Secondary | ICD-10-CM

## 2011-12-02 DIAGNOSIS — I472 Ventricular tachycardia, unspecified: Secondary | ICD-10-CM

## 2011-12-02 DIAGNOSIS — I4891 Unspecified atrial fibrillation: Secondary | ICD-10-CM

## 2011-12-02 DIAGNOSIS — I5022 Chronic systolic (congestive) heart failure: Secondary | ICD-10-CM

## 2011-12-02 DIAGNOSIS — I255 Ischemic cardiomyopathy: Secondary | ICD-10-CM

## 2011-12-02 MED ORDER — SPIRONOLACTONE 25 MG PO TABS: ORAL_TABLET | ORAL | Status: DC

## 2011-12-02 NOTE — Patient Instructions (Addendum)
Start spironolactone 12.5mg  daily. This will be one-half 25mg  tablet daily.  Your physician recommends that you have  lab work today--lipid profile/liver profile/BMET/TSH   Your physician recommends that you return for lab work in: 2 weeks--BMET   Your physician has recommended that you have a sleep study. This test records several body functions during sleep, including: brain activity, eye movement, oxygen and carbon dioxide blood levels, heart rate and rhythm, breathing rate and rhythm, the flow of air through your mouth and nose, snoring, body muscle movements, and chest and belly movement.  Your physician recommends that you schedule a follow-up appointment in: 3 months with Dr Shirlee Latch.

## 2011-12-03 LAB — HEPATIC FUNCTION PANEL
Alkaline Phosphatase: 61 U/L (ref 39–117)
Bilirubin, Direct: 0.1 mg/dL (ref 0.0–0.3)
Total Protein: 6.8 g/dL (ref 6.0–8.3)

## 2011-12-03 LAB — BASIC METABOLIC PANEL
BUN: 29 mg/dL — ABNORMAL HIGH (ref 6–23)
CO2: 26 mEq/L (ref 19–32)
Chloride: 107 mEq/L (ref 96–112)
Creatinine, Ser: 1.5 mg/dL — ABNORMAL HIGH (ref 0.4–1.2)
Glucose, Bld: 204 mg/dL — ABNORMAL HIGH (ref 70–99)

## 2011-12-03 LAB — LIPID PANEL
Cholesterol: 155 mg/dL (ref 0–200)
LDL Cholesterol: 87 mg/dL (ref 0–99)
Total CHOL/HDL Ratio: 3

## 2011-12-03 NOTE — Progress Notes (Signed)
Patient ID: [name], female   DOB: 07-11-1945, 66 y.o.   MRN: [id number] PCP: Dr. Mirna Mires  66 yo with history of CAD, ischemic cardiomyopathy, and atrial fibrillation presents for cardiology followup.  Patient was hospitalized in 11/12 at [facility] for VT with ICD discharge.  She had a left heart cath showing patent stents.  EF was 35-40% by echo.  She was started on amiodarone.    For the last few days, she has been feeling sick.  She has had a sore throat and hoarseness, as well as nasal congestion.  No fever.  Prior to that, she had been stable.  No dyspnea walking on flat ground, mild dyspnea walking up stairs.  No orthopnea/PND.  No chest pain. She continues to have significant low back pain.  Finally, she reports daytime sleepiness and snores at night.   ECG: a-paced, old inferior MI, LVH, anterolateral T wave inversions  Labs (10/12): LDL 73, HDL 44 Labs (11/12): K 3.9, creatinine 1.1, LFTs normal, TSH normal Labs (12/12): K 3.9, creatinine 1.5, proBNP 18 Labs (1/13): LDL 82, HDL 57 Labs (2/13): K 4.3, creatinine 1.5 Labs (5/13): creatinine 2.4 => 1.7, LFTs normal, TSH normal, proBNP 18 Labs (6/13): K 4.2, creatinine 1.7  PMH: 1. Diabetes mellitus 2. CVA, TIA in 2010 3. HTN 4. CKD 5. H/o TAH 6. H/o CCY 7. Sciatica 8. Atrial fibrillation 9. CAD: s/p LAD and RCA PCI.  Last LHC in 11/12 with patent proximal LAD stent, ostial 70% D1 (jailed by stent), mild LAD stent patent, patent RCA stents, EF 40% with global hypokinesis.  10. Ischemic cardiomyopathy: Echo (10/12): EF 35-40%, moderate focal basal septal hypertrophy, inferior akinesis, grade I diastolic dysfunction, moderate aortic insufficiency. St Jude dual chamber ICD.  11. Aortic insufficiency: moderate.  12. 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   SH: Divorced.  3 children.  Quit smoking in 1997. Lives in [place].  [place].   FH: CAD  ROS: All systems reviewed and negative except as per HPI.   Current  Outpatient Prescriptions  Medication Sig Dispense Refill  . amiodarone (PACERONE) 200 MG tablet Take 1 tablet (200 mg total) by mouth daily.  60 tablet  6  . carvedilol (COREG) 25 MG tablet Take 1 tablet (25 mg total) by mouth 2 (two) times daily with a meal.      . Cholecalciferol (VITAMIN D3) 2000 UNITS TABS Take 1 tablet by mouth daily.        . furosemide (LASIX) 20 MG tablet Take 20mg  daily--one-half 40mg  tablet daily      . gabapentin (NEURONTIN) 400 MG capsule Take 400 mg by mouth 2 (two) times daily.        . insulin detemir (LEVEMIR) 100 UNIT/ML injection Inject 60 Units into the skin 2 (two) times daily.       . insulin glulisine (APIDRA) 100 UNIT/ML injection Inject into the skin 3 (three) times daily before meals. Patient is on a sliding scale.      [name] lisinopril (PRINIVIL,ZESTRIL) 5 MG tablet TAKE 1 AND 1/2 TABS DAILY (7.5MG )  45 tablet  6  . nitroGLYCERIN (NITROSTAT) 0.4 MG SL tablet Place 0.4 mg under the tongue every 5 (five) minutes as needed. For chest pain      . omeprazole (PRILOSEC) 20 MG capsule Take 20 mg by mouth daily.      . rosuvastatin (CRESTOR) 20 MG tablet Take 20 mg by mouth daily.      . Vitamin D, Ergocalciferol, (DRISDOL)  50000 UNITS CAPS Take 50,000 Units by mouth. 50,000 units on Monday and 2,000 every day      . warfarin (COUMADIN) 2.5 MG tablet Take as directed by coumadin clinic  30 tablet  2  . spironolactone (ALDACTONE) 25 MG tablet 1/2 tablet (total 12.5mg ) daily  30 tablet  2    BP 118/66  Pulse 65  Temp 98 F (36.7 C)  Ht 5\' 3"  (1.6 m)  Wt 205 lb (92.987 kg)  BMI 36.31 kg/m2 General: NAD, obese Neck: Thick, JVP difficult but does not seem elevated, no thyromegaly or thyroid nodule.  Lungs: Clear to auscultation bilaterally with normal respiratory effort. CV: Nondisplaced PMI.  Heart regular S1/S2, no S3/S4, 1/6 SEM.  Trace ankle edema.  No carotid bruit.  Normal pedal pulses.  Abdomen: Soft, nontender, no hepatosplenomegaly, no distention.    Neurologic: Alert and oriented x 3.  Psych: Normal affect. Extremities: No clubbing or cyanosis.   Assessment/Plan  1. Atrial fibrillation  Paroxysmal. Continue warfarin. Given history of CVA, needs Lovenox bridge if she has to stop warfarin at any point of time. Maintaining NSR with amiodarone. Will get TSH and LFTs.  She will also need a yearly eye exam.  2. Coronary artery disease   Nonobstructive disease on recent cath. As she is on warfarin also with stable CAD, she is not on aspirin. Continue statin with goal LDL < 70.  3. Ventricular tachycardia On amiodarone with suppression of VT.  4. Ischemic cardiomyopathy NYHA class II-III symptoms.  She is not volume overloaded on exam.  - Continue current doses of Lasix, Coreg, and lisinopril.  - Add spironolactone 12.5 mg daily.  - BMET in 2 wks.  5. OSA Symptoms concerning for sleep apnea.  She has a thick neck.  I will arrange a sleep study.  6. URI I suspect she has a viral URI.  If no improvement soon, should see her PCP.   [employer]

## 2011-12-07 ENCOUNTER — Encounter: Payer: Self-pay | Admitting: Physical Medicine & Rehabilitation

## 2011-12-07 ENCOUNTER — Other Ambulatory Visit: Payer: Self-pay | Admitting: Cardiology

## 2011-12-08 ENCOUNTER — Encounter: Payer: PRIVATE HEALTH INSURANCE | Attending: Physical Medicine & Rehabilitation

## 2011-12-08 ENCOUNTER — Ambulatory Visit (HOSPITAL_BASED_OUTPATIENT_CLINIC_OR_DEPARTMENT_OTHER): Payer: PRIVATE HEALTH INSURANCE | Admitting: Physical Medicine & Rehabilitation

## 2011-12-08 ENCOUNTER — Encounter: Payer: Self-pay | Admitting: Physical Medicine & Rehabilitation

## 2011-12-08 ENCOUNTER — Other Ambulatory Visit: Payer: Self-pay | Admitting: Physical Medicine and Rehabilitation

## 2011-12-08 VITALS — BP 120/74 | HR 62 | Resp 12 | Ht 63.0 in | Wt 203.4 lb

## 2011-12-08 DIAGNOSIS — M545 Low back pain, unspecified: Secondary | ICD-10-CM

## 2011-12-08 DIAGNOSIS — I2510 Atherosclerotic heart disease of native coronary artery without angina pectoris: Secondary | ICD-10-CM | POA: Insufficient documentation

## 2011-12-08 DIAGNOSIS — Z8673 Personal history of transient ischemic attack (TIA), and cerebral infarction without residual deficits: Secondary | ICD-10-CM | POA: Insufficient documentation

## 2011-12-08 DIAGNOSIS — Z95810 Presence of automatic (implantable) cardiac defibrillator: Secondary | ICD-10-CM | POA: Insufficient documentation

## 2011-12-08 DIAGNOSIS — M47817 Spondylosis without myelopathy or radiculopathy, lumbosacral region: Secondary | ICD-10-CM

## 2011-12-08 DIAGNOSIS — M79609 Pain in unspecified limb: Secondary | ICD-10-CM | POA: Insufficient documentation

## 2011-12-08 DIAGNOSIS — I129 Hypertensive chronic kidney disease with stage 1 through stage 4 chronic kidney disease, or unspecified chronic kidney disease: Secondary | ICD-10-CM | POA: Insufficient documentation

## 2011-12-08 DIAGNOSIS — I4891 Unspecified atrial fibrillation: Secondary | ICD-10-CM | POA: Insufficient documentation

## 2011-12-08 DIAGNOSIS — I2589 Other forms of chronic ischemic heart disease: Secondary | ICD-10-CM | POA: Insufficient documentation

## 2011-12-08 DIAGNOSIS — Z5181 Encounter for therapeutic drug level monitoring: Secondary | ICD-10-CM

## 2011-12-08 DIAGNOSIS — M47816 Spondylosis without myelopathy or radiculopathy, lumbar region: Secondary | ICD-10-CM

## 2011-12-08 DIAGNOSIS — Z7901 Long term (current) use of anticoagulants: Secondary | ICD-10-CM | POA: Insufficient documentation

## 2011-12-08 DIAGNOSIS — E109 Type 1 diabetes mellitus without complications: Secondary | ICD-10-CM | POA: Insufficient documentation

## 2011-12-08 DIAGNOSIS — R209 Unspecified disturbances of skin sensation: Secondary | ICD-10-CM | POA: Insufficient documentation

## 2011-12-08 DIAGNOSIS — N189 Chronic kidney disease, unspecified: Secondary | ICD-10-CM | POA: Insufficient documentation

## 2011-12-08 MED ORDER — TRAMADOL HCL 50 MG PO TABS: 50.0000 mg | ORAL_TABLET | Freq: Three times a day (TID) | ORAL | Status: DC | PRN

## 2011-12-08 MED ORDER — GABAPENTIN 400 MG PO CAPS: 400.0000 mg | ORAL_CAPSULE | Freq: Three times a day (TID) | ORAL | Status: DC

## 2011-12-08 NOTE — Progress Notes (Signed)
Subjective:    Patient ID: [name], female    DOB: 04/03/1945, 66 y.o.   MRN: [id number]  HPI Patient with at least one year history of low back pain. It has been more constant over the last year. Initially had left lower extremity pain now has right lower ext pain.Has toe numbness on the right side only. Has a hard type feeling in the right lateral leg. No numbness on the left side. Past history significant for diabetes. Has had some weight gain over the last year. No physical therapy. Partial relief from hydrocodone. No specific imaging studies. Reviewed CT of the abdomen and pelvis and there was evidence of lumbar spondylosis at L34 and L4-5 levels Pain Inventory Average Pain 9 Pain Right Now 7 My pain is intermittent, constant and aching  In the last 24 hours, has pain interfered with the following? General activity 10 Relation with others 10 Enjoyment of life 10 What TIME of day is your pain at its worst? morning Sleep (in general) Fair  Pain is worse with: walking, bending and sitting Pain improves with: medication Relief from Meds: 3  Mobility walk without assistance how many minutes can you walk? 15 ability to climb steps?  no do you drive?  yes  Function disabled: date disabled 2009  Neuro/Psych weakness  Prior Studies Any changes since last visit?  no  Physicians involved in your care Any changes since last visit?  no Primary care Cathlyn Parsons   Family History  Problem Relation Age of Onset  . Diabetes Mother   . Heart disease Mother   . Heart disease Father    History   Social History  . Marital Status: Divorced    Spouse Name: N/A    Number of Children: N/A  . Years of Education: N/A   Social History Main Topics  . Smoking status: Former Smoker    Quit date: 08/16/1995  . Smokeless tobacco: Never Used  . Alcohol Use: No  . Drug Use: No  . Sexually Active: None   Other Topics Concern  . None   Social  History Narrative  . None   Past Surgical History  Procedure Date  . Abdominal hysterectomy 1980'S  . Cholecystectomy     1990'S  . Tubaligation 1980  . Icd 2003/2007    implanted by Dr Alanda Amass, most recent generator change 2/13 by Dr Johney Frame, Analyze ST study patient   Past Medical History  Diagnosis Date  . Chronic systolic heart failure   . Diabetes mellitus     type 1  . Hypertension   . Stroke 2010    eye doctor said she had TIA  . Ischemic cardiomyopathy   . Ventricular tachycardia     Polymorphic  . 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   . Atrial fibrillation     controlled with amiodarone, on coumadin  . Coronary artery disease 01/31/2011  . Chronic renal insufficiency    BP 120/74  Pulse 62  Resp 12  Ht 5\' 3"  (1.6 m)  Wt 203 lb 6.4 oz (92.262 kg)  BMI 36.03 kg/m2  SpO2 93%    Review of Systems  Constitutional: Positive for unexpected weight change.       Weight gain recent--has taken lyrica recently  Musculoskeletal: Positive for back pain.  Neurological: Positive for weakness.  All other systems reviewed and are negative.       Objective:   Physical Exam  Constitutional: She is oriented to person, place,  and time. She appears well-developed.       obese  HENT:  Head: Normocephalic and atraumatic.  Eyes: Conjunctivae normal and EOM are normal. Pupils are equal, round, and reactive to light.  Neck: Normal range of motion.  Musculoskeletal:       Lumbar back: She exhibits decreased range of motion, tenderness and pain.       Tenderness palpation L4 and L5 paraspinal muscles bilaterally Pain with extension of the lumbar spine but not with flexion reduced extension and lateral bending of the lumbar spine  Neurological: She is alert and oriented to person, place, and time. She has normal strength. She displays atrophy. A sensory deficit is present. She exhibits normal muscle tone. Gait normal.  Reflex Scores:      Tricep reflexes are 2+  on the right side and 2+ on the left side.      Bicep reflexes are 2+ on the right side and 2+ on the left side.      Brachioradialis reflexes are 2+ on the right side and 2+ on the left side.      Patellar reflexes are 2+ on the right side and 2+ on the left side.      Achilles reflexes are 0 on the right side and 0 on the left side.      Decreased sensation in both feet Intrinsic atrophy of the feet  Skin: Skin is warm and dry.  Psychiatric: She has a normal mood and affect. Her behavior is normal. Judgment and thought content normal.          Assessment & Plan:  1. Lumbar spondylosis evident on examination as well as on CT of the abdomen and pelvis. This is causing her back pain and pain with extension of the lumbar spine. She may benefit from medial branch blocks if she fails to progress with physical therapy.Start tramadol 50 mg 3 times a day  2. Lower extremity pain may be sciatic versus diabetic neuropathy. We will increase her gabapentin 400 mg 3 times a day If patient fails to improve will need a dedicated CT of the lumbosacral spine. Cannot have MRI due to  implantable cardio defibrillator. May need EMG if still with diagnostic uncertainty. Depending on further workup may benefit from epidural injection

## 2011-12-08 NOTE — Patient Instructions (Addendum)
An order for physical therapy has been submitted. The therapy office: [facility] [address] will contact you Tramadol is a new medication take it 3 times a day as needed Gabapentin has been increased to 400 mg 3 times a day I'll see back in one month

## 2011-12-11 ENCOUNTER — Ambulatory Visit: Payer: PRIVATE HEALTH INSURANCE

## 2011-12-15 ENCOUNTER — Ambulatory Visit: Payer: PRIVATE HEALTH INSURANCE | Attending: Rehabilitation | Admitting: Rehabilitation

## 2011-12-15 ENCOUNTER — Ambulatory Visit: Payer: PRIVATE HEALTH INSURANCE | Admitting: Rehabilitation

## 2011-12-15 DIAGNOSIS — M47817 Spondylosis without myelopathy or radiculopathy, lumbosacral region: Secondary | ICD-10-CM | POA: Insufficient documentation

## 2011-12-15 DIAGNOSIS — R293 Abnormal posture: Secondary | ICD-10-CM | POA: Insufficient documentation

## 2011-12-15 DIAGNOSIS — IMO0001 Reserved for inherently not codable concepts without codable children: Secondary | ICD-10-CM | POA: Insufficient documentation

## 2011-12-15 DIAGNOSIS — M2550 Pain in unspecified joint: Secondary | ICD-10-CM | POA: Insufficient documentation

## 2011-12-15 DIAGNOSIS — R262 Difficulty in walking, not elsewhere classified: Secondary | ICD-10-CM | POA: Insufficient documentation

## 2011-12-15 DIAGNOSIS — M2560 Stiffness of unspecified joint, not elsewhere classified: Secondary | ICD-10-CM | POA: Insufficient documentation

## 2011-12-16 ENCOUNTER — Other Ambulatory Visit (INDEPENDENT_AMBULATORY_CARE_PROVIDER_SITE_OTHER): Payer: PRIVATE HEALTH INSURANCE

## 2011-12-16 DIAGNOSIS — I5022 Chronic systolic (congestive) heart failure: Secondary | ICD-10-CM

## 2011-12-16 LAB — BASIC METABOLIC PANEL
BUN: 40 mg/dL — ABNORMAL HIGH (ref 6–23)
Chloride: 106 mEq/L (ref 96–112)
Potassium: 4 mEq/L (ref 3.5–5.1)
Sodium: 141 mEq/L (ref 135–145)

## 2011-12-17 ENCOUNTER — Other Ambulatory Visit: Payer: Self-pay | Admitting: *Deleted

## 2011-12-17 DIAGNOSIS — N289 Disorder of kidney and ureter, unspecified: Secondary | ICD-10-CM

## 2011-12-17 DIAGNOSIS — I2589 Other forms of chronic ischemic heart disease: Secondary | ICD-10-CM

## 2011-12-21 ENCOUNTER — Other Ambulatory Visit: Payer: Self-pay | Admitting: Cardiology

## 2011-12-22 ENCOUNTER — Ambulatory Visit: Payer: PRIVATE HEALTH INSURANCE | Admitting: Physical Therapy

## 2011-12-29 ENCOUNTER — Ambulatory Visit: Payer: PRIVATE HEALTH INSURANCE | Attending: Family Medicine | Admitting: Physical Therapy

## 2011-12-29 DIAGNOSIS — IMO0001 Reserved for inherently not codable concepts without codable children: Secondary | ICD-10-CM | POA: Insufficient documentation

## 2011-12-29 DIAGNOSIS — M47817 Spondylosis without myelopathy or radiculopathy, lumbosacral region: Secondary | ICD-10-CM | POA: Insufficient documentation

## 2011-12-29 DIAGNOSIS — R262 Difficulty in walking, not elsewhere classified: Secondary | ICD-10-CM | POA: Insufficient documentation

## 2011-12-29 DIAGNOSIS — R293 Abnormal posture: Secondary | ICD-10-CM | POA: Insufficient documentation

## 2011-12-29 DIAGNOSIS — M2560 Stiffness of unspecified joint, not elsewhere classified: Secondary | ICD-10-CM | POA: Insufficient documentation

## 2011-12-29 DIAGNOSIS — M2550 Pain in unspecified joint: Secondary | ICD-10-CM | POA: Insufficient documentation

## 2011-12-31 ENCOUNTER — Encounter (HOSPITAL_BASED_OUTPATIENT_CLINIC_OR_DEPARTMENT_OTHER): Payer: PRIVATE HEALTH INSURANCE

## 2011-12-31 ENCOUNTER — Ambulatory Visit: Payer: PRIVATE HEALTH INSURANCE | Admitting: Physical Therapy

## 2011-12-31 ENCOUNTER — Other Ambulatory Visit (INDEPENDENT_AMBULATORY_CARE_PROVIDER_SITE_OTHER): Payer: PRIVATE HEALTH INSURANCE

## 2011-12-31 DIAGNOSIS — I2589 Other forms of chronic ischemic heart disease: Secondary | ICD-10-CM

## 2011-12-31 DIAGNOSIS — N289 Disorder of kidney and ureter, unspecified: Secondary | ICD-10-CM

## 2011-12-31 LAB — BASIC METABOLIC PANEL
BUN: 29 mg/dL — ABNORMAL HIGH (ref 6–23)
Calcium: 9.2 mg/dL (ref 8.4–10.5)
Creatinine, Ser: 1.4 mg/dL — ABNORMAL HIGH (ref 0.4–1.2)
GFR: 38.84 mL/min — ABNORMAL LOW (ref >60.00)
Glucose, Bld: 216 mg/dL — ABNORMAL HIGH (ref 70–99)

## 2012-01-05 ENCOUNTER — Encounter: Payer: PRIVATE HEALTH INSURANCE | Admitting: Rehabilitation

## 2012-01-05 ENCOUNTER — Ambulatory Visit: Payer: PRIVATE HEALTH INSURANCE | Admitting: Physical Medicine & Rehabilitation

## 2012-01-07 ENCOUNTER — Encounter: Payer: PRIVATE HEALTH INSURANCE | Admitting: Rehabilitation

## 2012-01-11 ENCOUNTER — Ambulatory Visit (HOSPITAL_BASED_OUTPATIENT_CLINIC_OR_DEPARTMENT_OTHER): Payer: PRIVATE HEALTH INSURANCE | Admitting: Physical Medicine & Rehabilitation

## 2012-01-11 ENCOUNTER — Encounter: Payer: PRIVATE HEALTH INSURANCE | Attending: Physical Medicine & Rehabilitation

## 2012-01-11 ENCOUNTER — Encounter: Payer: Self-pay | Admitting: Physical Medicine & Rehabilitation

## 2012-01-11 VITALS — BP 84/47 | HR 74 | Resp 14 | Ht 63.0 in | Wt 204.0 lb

## 2012-01-11 DIAGNOSIS — Z8673 Personal history of transient ischemic attack (TIA), and cerebral infarction without residual deficits: Secondary | ICD-10-CM | POA: Insufficient documentation

## 2012-01-11 DIAGNOSIS — E109 Type 1 diabetes mellitus without complications: Secondary | ICD-10-CM | POA: Insufficient documentation

## 2012-01-11 DIAGNOSIS — R209 Unspecified disturbances of skin sensation: Secondary | ICD-10-CM | POA: Insufficient documentation

## 2012-01-11 DIAGNOSIS — M5431 Sciatica, right side: Secondary | ICD-10-CM | POA: Insufficient documentation

## 2012-01-11 DIAGNOSIS — N189 Chronic kidney disease, unspecified: Secondary | ICD-10-CM | POA: Insufficient documentation

## 2012-01-11 DIAGNOSIS — M545 Low back pain, unspecified: Secondary | ICD-10-CM | POA: Insufficient documentation

## 2012-01-11 DIAGNOSIS — I2510 Atherosclerotic heart disease of native coronary artery without angina pectoris: Secondary | ICD-10-CM | POA: Insufficient documentation

## 2012-01-11 DIAGNOSIS — Z95810 Presence of automatic (implantable) cardiac defibrillator: Secondary | ICD-10-CM | POA: Insufficient documentation

## 2012-01-11 DIAGNOSIS — M47817 Spondylosis without myelopathy or radiculopathy, lumbosacral region: Secondary | ICD-10-CM | POA: Insufficient documentation

## 2012-01-11 DIAGNOSIS — Z7901 Long term (current) use of anticoagulants: Secondary | ICD-10-CM | POA: Insufficient documentation

## 2012-01-11 DIAGNOSIS — I2589 Other forms of chronic ischemic heart disease: Secondary | ICD-10-CM | POA: Insufficient documentation

## 2012-01-11 DIAGNOSIS — M79609 Pain in unspecified limb: Secondary | ICD-10-CM | POA: Insufficient documentation

## 2012-01-11 DIAGNOSIS — I4891 Unspecified atrial fibrillation: Secondary | ICD-10-CM | POA: Insufficient documentation

## 2012-01-11 DIAGNOSIS — M47816 Spondylosis without myelopathy or radiculopathy, lumbar region: Secondary | ICD-10-CM

## 2012-01-11 DIAGNOSIS — M5430 Sciatica, unspecified side: Secondary | ICD-10-CM

## 2012-01-11 DIAGNOSIS — I129 Hypertensive chronic kidney disease with stage 1 through stage 4 chronic kidney disease, or unspecified chronic kidney disease: Secondary | ICD-10-CM | POA: Insufficient documentation

## 2012-01-11 HISTORY — DX: Spondylosis without myelopathy or radiculopathy, lumbar region: M47.816

## 2012-01-11 MED ORDER — TRAMADOL-ACETAMINOPHEN 37.5-325 MG PO TABS: 1.0000 | ORAL_TABLET | Freq: Four times a day (QID) | ORAL | Status: DC | PRN

## 2012-01-11 NOTE — Patient Instructions (Signed)
CT Lumbar spine Review results of test next visit and discuss next steps

## 2012-01-11 NOTE — Progress Notes (Signed)
Subjective:    Patient ID: [name], female    DOB: 06-20-1945, 66 y.o.   MRN: [id number]  HPI Patient with at least one year history of low back pain. It has been more constant over the last year. Initially had left lower extremity pain now has right lower ext pain.Has toe numbness on the right side only. Has a hard type feeling in the right lateral leg.  No numbness on the left side. Past history significant for diabetes.  Has had some weight gain over the last year.  No physical therapy. Partial relief from hydrocodone.  No specific imaging studies. Reviewed CT of the abdomen and pelvis and there was evidence of lumbar spondylosis at L34 and L4-5 levels  Initial valuation was approximately one month ago. She completed her physical therapy however was mainly doing some passive stretching some the size and really not much active exercise. Patient does not feel like she had much benefit from this  Has not had injections. Limited improvements with tramadol and this also causes drowsiness and dizziness. Other pain medications also cause similar side effects Pain Inventory Average Pain 9 Pain Right Now 0 My pain is aching  In the last 24 hours, has pain interfered with the following? General activity 8 Relation with others 0 Enjoyment of life 8 What TIME of day is your pain at its worst? daytime Sleep (in general) Fair  Pain is worse with: walking, bending, sitting and standing Pain improves with: medication Relief from Meds: 2  Mobility walk without assistance how many minutes can you walk? 10-15 ability to climb steps?  no do you drive?  yes transfers alone  Function disabled: date disabled 03/2005  Neuro/Psych No problems in this area  Prior Studies Any changes since last visit?  no  Physicians involved in your care Any changes since last visit?  no   Family History  Problem Relation Age of Onset  . Diabetes Mother   . Heart disease Mother   . Heart  disease Father    History   Social History  . Marital Status: Divorced    Spouse Name: N/A    Number of Children: N/A  . Years of Education: N/A   Social History Main Topics  . Smoking status: Former Smoker    Quit date: 08/16/1995  . Smokeless tobacco: Never Used  . Alcohol Use: No  . Drug Use: No  . Sexually Active: None   Other Topics Concern  . None   Social History Narrative  . None   Past Surgical History  Procedure Date  . Abdominal hysterectomy 1980'S  . Cholecystectomy     1990'S  . Tubaligation 1980  . Icd 2003/2007    implanted by Dr Alanda Amass, most recent generator change 2/13 by Dr Johney Frame, Analyze ST study patient   Past Medical History  Diagnosis Date  . Chronic systolic heart failure   . Diabetes mellitus     type 1  . Hypertension   . Stroke 2010    eye doctor said she had TIA  . Ischemic cardiomyopathy   . Ventricular tachycardia     Polymorphic  . 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   . Atrial fibrillation     controlled with amiodarone, on coumadin  . Coronary artery disease 01/31/2011  . Chronic renal insufficiency    BP 84/47  Pulse 74  Resp 14  Ht 5\' 3"  (1.6 m)  Wt 204 lb (92.534 kg)  BMI 36.14 kg/m2  SpO2  94%     Review of Systems  Constitutional: Positive for unexpected weight change.  Gastrointestinal: Positive for nausea, vomiting and constipation.  Musculoskeletal: Positive for myalgias, back pain and arthralgias.  All other systems reviewed and are negative.       Objective:   Physical Exam  Eyes: Conjunctivae normal and EOM are normal. Pupils are equal, round, and reactive to light.  Neck: Normal range of motion.  Musculoskeletal:  Lumbar back: She exhibits decreased range of motion, tenderness and pain.  Tenderness palpation L4 and L5 paraspinal muscles bilaterally Pain with extension of the lumbar spine but not with flexion reduced extension and lateral bending of the lumbar spine  Neurological:  She is alert and oriented to person, place, and time. She has normal strength. She displays atrophy. A sensory deficit is present. She exhibits normal muscle tone. Gait normal.  Reflex Scores:  Tricep reflexes are 2+ on the right side and 2+ on the left side.  Bicep reflexes are 2+ on the right side and 2+ on the left side.  Brachioradialis reflexes are 2+ on the right side and 2+ on the left side.  Patellar reflexes are 2+ on the right side and 2+ on the left side.  Achilles reflexes are 0 on the right side and 0 on the left side. Decreased sensation in Right foot Intrinsic atrophy of the feet  Skin: Skin is warm and dry.  Psychiatric: She has a normal mood and affect. Her behavior is normal. Judgment and thought content normal.      Assessment & Plan:  1. Lumbar spondylosis evident on examination as well as on CT of the abdomen and pelvis. This is causing her back pain and pain with extension of the lumbar spine. She may benefit from medial branch blocks if she fails to progress with physical therapy.Start tramadol 50 mg 3 times a day  2. Lower extremity pain may be sciatic Appears to be more right-sided on examination this month. We will increase her gabapentin 400 mg 3 times a day  If patient fails to improve will need a dedicated CT of the lumbosacral spine. Cannot have MRI due to implantable cardio defibrillator. May need EMG if still with diagnostic uncertainty.  Depending on further workup may benefit from epidural injection

## 2012-01-13 ENCOUNTER — Ambulatory Visit (INDEPENDENT_AMBULATORY_CARE_PROVIDER_SITE_OTHER): Payer: PRIVATE HEALTH INSURANCE | Admitting: *Deleted

## 2012-01-13 DIAGNOSIS — I4891 Unspecified atrial fibrillation: Secondary | ICD-10-CM

## 2012-01-13 DIAGNOSIS — Z7901 Long term (current) use of anticoagulants: Secondary | ICD-10-CM

## 2012-01-19 ENCOUNTER — Ambulatory Visit (HOSPITAL_COMMUNITY)
Admission: RE | Admit: 2012-01-19 | Discharge: 2012-01-19 | Disposition: A | Discharge status: Home / Self Care | Payer: PRIVATE HEALTH INSURANCE | Source: Ambulatory Visit | Attending: Physical Medicine & Rehabilitation | Admitting: Physical Medicine & Rehabilitation

## 2012-01-19 DIAGNOSIS — M545 Low back pain, unspecified: Secondary | ICD-10-CM | POA: Insufficient documentation

## 2012-01-19 DIAGNOSIS — M519 Unspecified thoracic, thoracolumbar and lumbosacral intervertebral disc disorder: Secondary | ICD-10-CM | POA: Insufficient documentation

## 2012-01-19 DIAGNOSIS — M79609 Pain in unspecified limb: Secondary | ICD-10-CM | POA: Insufficient documentation

## 2012-01-19 DIAGNOSIS — M48061 Spinal stenosis, lumbar region without neurogenic claudication: Secondary | ICD-10-CM | POA: Insufficient documentation

## 2012-01-19 DIAGNOSIS — M5431 Sciatica, right side: Secondary | ICD-10-CM

## 2012-01-19 DIAGNOSIS — M5430 Sciatica, unspecified side: Secondary | ICD-10-CM | POA: Insufficient documentation

## 2012-01-25 ENCOUNTER — Telehealth: Payer: Self-pay

## 2012-01-25 ENCOUNTER — Ambulatory Visit (HOSPITAL_BASED_OUTPATIENT_CLINIC_OR_DEPARTMENT_OTHER): Payer: PRIVATE HEALTH INSURANCE | Admitting: Physical Medicine & Rehabilitation

## 2012-01-25 ENCOUNTER — Telehealth: Payer: Self-pay | Admitting: Cardiology

## 2012-01-25 ENCOUNTER — Encounter: Payer: PRIVATE HEALTH INSURANCE | Attending: Physical Medicine & Rehabilitation

## 2012-01-25 ENCOUNTER — Encounter: Payer: Self-pay | Admitting: Physical Medicine & Rehabilitation

## 2012-01-25 VITALS — BP 130/54 | HR 72 | Resp 14 | Ht 63.0 in | Wt 206.6 lb

## 2012-01-25 DIAGNOSIS — M545 Low back pain, unspecified: Secondary | ICD-10-CM | POA: Insufficient documentation

## 2012-01-25 DIAGNOSIS — N189 Chronic kidney disease, unspecified: Secondary | ICD-10-CM | POA: Insufficient documentation

## 2012-01-25 DIAGNOSIS — M5431 Sciatica, right side: Secondary | ICD-10-CM

## 2012-01-25 DIAGNOSIS — I4891 Unspecified atrial fibrillation: Secondary | ICD-10-CM | POA: Insufficient documentation

## 2012-01-25 DIAGNOSIS — M48061 Spinal stenosis, lumbar region without neurogenic claudication: Secondary | ICD-10-CM

## 2012-01-25 DIAGNOSIS — I2510 Atherosclerotic heart disease of native coronary artery without angina pectoris: Secondary | ICD-10-CM | POA: Insufficient documentation

## 2012-01-25 DIAGNOSIS — M47817 Spondylosis without myelopathy or radiculopathy, lumbosacral region: Secondary | ICD-10-CM

## 2012-01-25 DIAGNOSIS — Z7901 Long term (current) use of anticoagulants: Secondary | ICD-10-CM | POA: Insufficient documentation

## 2012-01-25 DIAGNOSIS — R209 Unspecified disturbances of skin sensation: Secondary | ICD-10-CM | POA: Insufficient documentation

## 2012-01-25 DIAGNOSIS — E109 Type 1 diabetes mellitus without complications: Secondary | ICD-10-CM | POA: Insufficient documentation

## 2012-01-25 DIAGNOSIS — I2589 Other forms of chronic ischemic heart disease: Secondary | ICD-10-CM | POA: Insufficient documentation

## 2012-01-25 DIAGNOSIS — M79609 Pain in unspecified limb: Secondary | ICD-10-CM | POA: Insufficient documentation

## 2012-01-25 DIAGNOSIS — I129 Hypertensive chronic kidney disease with stage 1 through stage 4 chronic kidney disease, or unspecified chronic kidney disease: Secondary | ICD-10-CM | POA: Insufficient documentation

## 2012-01-25 DIAGNOSIS — M47816 Spondylosis without myelopathy or radiculopathy, lumbar region: Secondary | ICD-10-CM

## 2012-01-25 DIAGNOSIS — Z8673 Personal history of transient ischemic attack (TIA), and cerebral infarction without residual deficits: Secondary | ICD-10-CM | POA: Insufficient documentation

## 2012-01-25 DIAGNOSIS — M5430 Sciatica, unspecified side: Secondary | ICD-10-CM

## 2012-01-25 DIAGNOSIS — Z95810 Presence of automatic (implantable) cardiac defibrillator: Secondary | ICD-10-CM | POA: Insufficient documentation

## 2012-01-25 NOTE — Telephone Encounter (Signed)
New Problem:    Called in because the patient is going to have a procedure and wanted to know if they could hold her coumadin for 5 days.  Please call back.

## 2012-01-25 NOTE — Telephone Encounter (Signed)
Pt advised, verbalized understanding. Pt states injection is scheduled for 02/09/12. I will forward to [facility] to follow-up with pt about bridging with lovenox while off coumadin.

## 2012-01-25 NOTE — Telephone Encounter (Signed)
Message copied by Judd Gaudier on Mon Jan 25, 2012  4:25 PM ------      Message from: Erick Colace      Created: Mon Jan 25, 2012  3:51 PM      Regarding: FW: Can Pt come off warfarin                   ----- Message -----         From: Laurey Morale, MD         Sent: 01/25/2012  12:45 PM           To: Mariane Masters, PHARMD, #      Subject: RE: Can Pt come off warfarin                             She will need to be bridged with Lovenox while off coumadin given CVA and TIA history.  I will forward this note to our coumadin pharmacist.       Freida Busman      ----- Message -----         From: Erick Colace, MD         Sent: 01/25/2012  10:08 AM           To: Laurey Morale, MD      Subject: Can Pt come off warfarin                                 I am recommending a R L 4 transforaminal epidural steroid injection for this pt.  Can she come off warfarin for 5 days prir to injection.  I need INR<1.4      She may take 81mg  ASA            Thanks,            Ricarda Frame MD

## 2012-01-25 NOTE — Progress Notes (Signed)
Subjective:    Patient ID: [name], female    DOB: [birth year], 66 y.o.   MRN: [id number]  HPI Approx one year history of low back pain. It has been more constant over the last year. Initially had left lower extremity pain now has right lower ext pain.Has toe numbness on the right side only. Has a hard type feeling in the right lateral leg.  No numbness on the left side. Past history significant for diabetes.  Has had some weight gain over the last year.  No physical therapy. Partial relief from hydrocodone.  No specific imaging studies. Reviewed CT of the abdomen and pelvis and there was evidence of lumbar spondylosis at L34 and L4-5 levels  Initial valuation was approximately 6 wks ago. She completed her physical therapy however was mainly doing some passive stretching some the size and really not much active exercise. Patient does not feel like she had much benefit from this  Has not had injections. Limited improvements with tramadol and this also causes drowsiness and dizziness. Other pain medications also cause similar side effects  CT Lumbar spine 01/19/2012 Multifactorial spinal stenosis at L4-L5 secondary to 3 mm of facet  mediated slip, posterior element hypertrophy, and central and  leftward protrusion. Left L5 nerve root encroachment is observed. No right-sided nerve root encroachment seen specifically  Pain Inventory Average Pain 6 Pain Right Now 5 My pain is aching  In the last 24 hours, has pain interfered with the following? General activity 2 Relation with others 5 Enjoyment of life 4 What TIME of day is your pain at its worst? evening Sleep (in general) Good  Pain is worse with: walking, bending, standing and some activites Pain improves with: medication Relief from Meds: 2  Mobility walk without assistance how many minutes can you walk? 15 ability to climb steps?  no do you drive?  yes  Function disabled: date disabled  2007 retired  Neuro/Psych No problems in this area  Prior Studies Any changes since last visit?  no  Physicians involved in your care Any changes since last visit?  no   Family History  Problem Relation Age of Onset  . Diabetes Mother   . Heart disease Mother   . Heart disease Father    History   Social History  . Marital Status: Divorced    Spouse Name: N/A    Number of Children: N/A  . Years of Education: N/A   Social History Main Topics  . Smoking status: Former Smoker    Quit date: 08/16/1995  . Smokeless tobacco: Never Used  . Alcohol Use: No  . Drug Use: No  . Sexually Active: None   Other Topics Concern  . None   Social History Narrative  . None   Past Surgical History  Procedure Date  . Abdominal hysterectomy 1980'S  . Cholecystectomy     1990'S  . Tubaligation 1980  . Icd 2003/2007    implanted by Dr Alanda Amass, most recent generator change 2/13 by Dr Johney Frame, Analyze ST study patient   Past Medical History  Diagnosis Date  . Chronic systolic heart failure   . Diabetes mellitus     type 1  . Hypertension   . Stroke 2010    eye doctor said she had TIA  . Ischemic cardiomyopathy   . Ventricular tachycardia     Polymorphic  . 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   . Atrial fibrillation     controlled with amiodarone, on coumadin  .  Coronary artery disease 01/31/2011  . Chronic renal insufficiency   . Lumbar spondylosis 01/11/2012   BP 130/54  Pulse 72  Resp 14  Ht 5\' 3"  (1.6 m)  Wt 206 lb 9.6 oz (93.713 kg)  BMI 36.60 kg/m2  SpO2 94%    Review of Systems  Constitutional: Positive for unexpected weight change.  Gastrointestinal: Positive for nausea and vomiting.  All other systems reviewed and are negative.       Objective:   Physical Exam  Eyes: Conjunctivae normal and EOM are normal. Pupils are equal, round, and reactive to light.  Neck: Normal range of motion.  Musculoskeletal:  Lumbar back: She exhibits  decreased range of motion, tenderness and pain.  Tenderness palpation L4 and L5 paraspinal muscles bilaterally Pain with extension of the lumbar spine but not with flexion reduced extension and lateral bending of the lumbar spine  Neurological: She is alert and oriented to person, place, and time. She has normal strength. She displays atrophy. A sensory deficit is present. She exhibits normal muscle tone. Gait normal.  Reflex Scores:  Tricep reflexes are 2+ on the right side and 2+ on the left side.  Bicep reflexes are 2+ on the right side and 2+ on the left side.  Brachioradialis reflexes are 2+ on the right side and 2+ on the left side.  Patellar reflexes are 2+ on the right side and 2+ on the left side.  Achilles reflexes are 0 on the right side and 0 on the left side. Decreased sensation in Right foot Intrinsic atrophy of the feet  Skin: Skin is warm and dry.  Psychiatric: She has a normal mood and affect. Her behavior is normal. Judgment and thought content normal.        Assessment & Plan:  1. Lumbar spinal stenosis. The central stenosis is likely causing the right L4 nerve root irritation. I am recommending epidural steroid injection transforaminal route. She will need to come up her Coumadin. I contacted her cardiologist who stated she would need to be bridged with Lovenox.  2. Lumbar spondylosis L4-5 and L5-S1 levels on my review of the CT. If the patient has residual axial back pain after the epidural injection may need to proceed on to medial branch blocks.  We'll continue tramadol and gabapentin

## 2012-01-25 NOTE — Telephone Encounter (Signed)
I will forward to Dr McLean for review 

## 2012-01-25 NOTE — Telephone Encounter (Signed)
Pt wants to come off coumadin for 5 days to get an injection in her back because she is having back trouble

## 2012-01-25 NOTE — Telephone Encounter (Signed)
She will need Lovenox bridge given her history of CVA and TIA.

## 2012-01-25 NOTE — Patient Instructions (Addendum)
You're scheduled for epidural steroid injection We will get the okay from Dr. Ronelle Nigh We will need to get a pro time within 24 hours of the injection you will need a driver that day

## 2012-01-25 NOTE — Telephone Encounter (Signed)
Lm for Dr Lucien Mons to find out if it is ok for patient to be off her coumadin for 5 days prior to procedure.  She can use a low dose of aspirin if needed.

## 2012-01-26 NOTE — Telephone Encounter (Signed)
Patient advised of change and will be contacting Dr Freida Busman.

## 2012-02-02 ENCOUNTER — Ambulatory Visit (INDEPENDENT_AMBULATORY_CARE_PROVIDER_SITE_OTHER): Payer: PRIVATE HEALTH INSURANCE | Admitting: *Deleted

## 2012-02-02 ENCOUNTER — Telehealth: Payer: Self-pay | Admitting: *Deleted

## 2012-02-02 DIAGNOSIS — Z7901 Long term (current) use of anticoagulants: Secondary | ICD-10-CM

## 2012-02-02 DIAGNOSIS — I4891 Unspecified atrial fibrillation: Secondary | ICD-10-CM

## 2012-02-02 MED ORDER — ENOXAPARIN SODIUM 100 MG/ML ~~LOC~~ SOLN: 100.0000 mg | Freq: Two times a day (BID) | SUBCUTANEOUS | Status: DC

## 2012-02-02 NOTE — Telephone Encounter (Signed)
[name] canceled her sleep study 02/05/12 @ 8pm. Due to back pain. She will call back to reschedule.

## 2012-02-02 NOTE — Patient Instructions (Addendum)
02/03/2012 last dose of coumadin 02/04/2012 no lovenox no coumadin 02/05/2012 no coumadin Lovenox 100 mg Am and Lovenox 100mg  PM 02/06/2012 no coumadin Lovenox 100mg  Am and Lovenox 100 mg PM 02/07/2012 no coumadin Lovenox 100 mg Am and Lovenox 100 mg PM 02/08/2012 no coumadin Lovenox 100 mg AM only  No lovenox in the PM  02/09/2012  Day of procedure no coumadin no lovenox then when MD instructs to restart coumadin and lovenox  Take lovenox and coumadin as before except with coumadin take extra 1/2 tablet for 2 days  Continue lovenox and coumadin until seen in clinic on 02/15/2012

## 2012-02-05 ENCOUNTER — Encounter (HOSPITAL_BASED_OUTPATIENT_CLINIC_OR_DEPARTMENT_OTHER): Payer: PRIVATE HEALTH INSURANCE

## 2012-02-09 ENCOUNTER — Encounter: Payer: Self-pay | Admitting: Physical Medicine & Rehabilitation

## 2012-02-09 ENCOUNTER — Ambulatory Visit (HOSPITAL_BASED_OUTPATIENT_CLINIC_OR_DEPARTMENT_OTHER): Payer: PRIVATE HEALTH INSURANCE | Admitting: Physical Medicine & Rehabilitation

## 2012-02-09 VITALS — BP 129/70 | HR 65 | Resp 14 | Ht 63.0 in | Wt 206.0 lb

## 2012-02-09 DIAGNOSIS — IMO0002 Reserved for concepts with insufficient information to code with codable children: Secondary | ICD-10-CM

## 2012-02-09 NOTE — Patient Instructions (Signed)
Resume warfarin tonight

## 2012-02-09 NOTE — Progress Notes (Signed)
Lumbar transforaminal epidural steroid injection under fluoroscopic guidance  Indication: Lumbosacral radiculitis is not relieved by medication management or other conservative care and interfering with self-care and mobility.   Off warfarin for 7 days has been bridged with Lovenox last dose yesterday  Informed consent was obtained after describing risk and benefits of the procedure with the patient, this includes bleeding, bruising, infection, paralysis and medication side effects.  The patient wishes to proceed and has given written consent.  Patient was placed in prone position.  The lumbar area was marked and prepped with Betadine.  It was entered with a 25-gauge 1-1/2 inch needle and one mL of 1% lidocaine was injected into the skin and subcutaneous tissue.  Then a 22-gauge 5 inch spinal needle was inserted into the R L4-5 intervertebral foramen under AP, lateral, and oblique view.  Then a solution containing one mL of 10 mg per mL dexamethasone and 2 mL of 1% lidocaine was injected.  The patient tolerated procedure well.  Post procedure instructions were given.  Please see post procedure form.

## 2012-02-09 NOTE — Progress Notes (Signed)
  PROCEDURE RECORD [facility]   Name: [name] DOB:[birth year] MRN: [id number]  Date:02/09/2012  Physician: Claudette Laws, MD    Nurse/CMA: Kelli Churn, CMA/Walker, CMA  Allergies:  Allergies  Allergen Reactions  . Darvon Other (See Comments)    indigestion  . Promethazine Hcl Other (See Comments)    hyperactivity  . Plavix (Clopidogrel Bisulfate) Rash    Consent Signed: yes  Is patient diabetic? yes CBG today? 212  Pregnant: no LMP: No LMP recorded. Patient has had a hysterectomy. (age [age])  Anticoagulants: yes (lovenox, coumadin-stopped 5 days ago) Anti-inflammatory: no Antibiotics: no  Procedure:right Transforaminal epidural steroid injection  Position: Prone Start Time: 2:06 End Time:  2:11 Fluoro Time: 16 seconds  RN/CMA Levens, CMA Shumaker RN    Time 122pm 2:16    BP 129/70 148/65    Pulse 65 73    Respirations 14 14    O2 Sat 96 97    S/S 6 6    Pain Level 10/10 6/10     D/C home with Vanessa-granddaughter, patient A & O X 3, D/C instructions reviewed, and sits independently.

## 2012-02-15 ENCOUNTER — Ambulatory Visit (INDEPENDENT_AMBULATORY_CARE_PROVIDER_SITE_OTHER): Payer: PRIVATE HEALTH INSURANCE | Admitting: *Deleted

## 2012-02-15 DIAGNOSIS — I4891 Unspecified atrial fibrillation: Secondary | ICD-10-CM

## 2012-02-15 DIAGNOSIS — Z7901 Long term (current) use of anticoagulants: Secondary | ICD-10-CM

## 2012-02-15 LAB — POCT INR: INR: 1.9

## 2012-03-02 ENCOUNTER — Ambulatory Visit: Payer: PRIVATE HEALTH INSURANCE | Admitting: Cardiology

## 2012-03-02 ENCOUNTER — Ambulatory Visit (INDEPENDENT_AMBULATORY_CARE_PROVIDER_SITE_OTHER): Payer: PRIVATE HEALTH INSURANCE | Admitting: *Deleted

## 2012-03-02 DIAGNOSIS — Z7901 Long term (current) use of anticoagulants: Secondary | ICD-10-CM

## 2012-03-02 DIAGNOSIS — I4891 Unspecified atrial fibrillation: Secondary | ICD-10-CM

## 2012-03-08 ENCOUNTER — Encounter: Payer: Self-pay | Admitting: Physical Medicine & Rehabilitation

## 2012-03-08 ENCOUNTER — Ambulatory Visit (HOSPITAL_BASED_OUTPATIENT_CLINIC_OR_DEPARTMENT_OTHER): Payer: PRIVATE HEALTH INSURANCE | Admitting: Physical Medicine & Rehabilitation

## 2012-03-08 ENCOUNTER — Encounter: Payer: PRIVATE HEALTH INSURANCE | Attending: Physical Medicine & Rehabilitation

## 2012-03-08 VITALS — BP 119/69 | HR 66 | Resp 14 | Ht 63.0 in | Wt 209.4 lb

## 2012-03-08 DIAGNOSIS — R209 Unspecified disturbances of skin sensation: Secondary | ICD-10-CM | POA: Insufficient documentation

## 2012-03-08 DIAGNOSIS — I2510 Atherosclerotic heart disease of native coronary artery without angina pectoris: Secondary | ICD-10-CM | POA: Insufficient documentation

## 2012-03-08 DIAGNOSIS — I4891 Unspecified atrial fibrillation: Secondary | ICD-10-CM | POA: Insufficient documentation

## 2012-03-08 DIAGNOSIS — N189 Chronic kidney disease, unspecified: Secondary | ICD-10-CM | POA: Insufficient documentation

## 2012-03-08 DIAGNOSIS — M545 Low back pain, unspecified: Secondary | ICD-10-CM | POA: Insufficient documentation

## 2012-03-08 DIAGNOSIS — E109 Type 1 diabetes mellitus without complications: Secondary | ICD-10-CM | POA: Insufficient documentation

## 2012-03-08 DIAGNOSIS — M47817 Spondylosis without myelopathy or radiculopathy, lumbosacral region: Secondary | ICD-10-CM | POA: Insufficient documentation

## 2012-03-08 DIAGNOSIS — IMO0002 Reserved for concepts with insufficient information to code with codable children: Secondary | ICD-10-CM

## 2012-03-08 DIAGNOSIS — Z7901 Long term (current) use of anticoagulants: Secondary | ICD-10-CM | POA: Insufficient documentation

## 2012-03-08 DIAGNOSIS — M79609 Pain in unspecified limb: Secondary | ICD-10-CM | POA: Insufficient documentation

## 2012-03-08 DIAGNOSIS — I2589 Other forms of chronic ischemic heart disease: Secondary | ICD-10-CM | POA: Insufficient documentation

## 2012-03-08 DIAGNOSIS — Z95810 Presence of automatic (implantable) cardiac defibrillator: Secondary | ICD-10-CM | POA: Insufficient documentation

## 2012-03-08 DIAGNOSIS — I129 Hypertensive chronic kidney disease with stage 1 through stage 4 chronic kidney disease, or unspecified chronic kidney disease: Secondary | ICD-10-CM | POA: Insufficient documentation

## 2012-03-08 DIAGNOSIS — Z8673 Personal history of transient ischemic attack (TIA), and cerebral infarction without residual deficits: Secondary | ICD-10-CM | POA: Insufficient documentation

## 2012-03-08 MED ORDER — CEPHALEXIN 500 MG PO CAPS: 500.0000 mg | ORAL_CAPSULE | Freq: Three times a day (TID) | ORAL | Status: DC

## 2012-03-08 MED ORDER — TOPIRAMATE 25 MG PO TABS: 25.0000 mg | ORAL_TABLET | Freq: Two times a day (BID) | ORAL | Status: DC

## 2012-03-08 MED ORDER — TRAMADOL-ACETAMINOPHEN 37.5-325 MG PO TABS: 1.0000 | ORAL_TABLET | Freq: Three times a day (TID) | ORAL | Status: DC | PRN

## 2012-03-08 NOTE — Progress Notes (Signed)
Subjective:    Patient ID: [name], female    DOB: [birth year], 66 y.o.   MRN: [id number]  HPI  No improvement with right L4 epidural Has tried Lyrica 75 mg twice a day samples from primary physician no relief No relief with gabapentin thus far Has a combination of back pain as well as right leg pain Pain Inventory Average Pain 8 Pain Right Now 8 My pain is aching  In the last 24 hours, has pain interfered with the following? General activity 4 Relation with others 2 Enjoyment of life 4 What TIME of day is your pain at its worst? daytime Sleep (in general) Good  Pain is worse with: walking, bending, standing and some activites Pain improves with: medication Relief from Meds: 3  Mobility how many minutes can you walk? 15 ability to climb steps?  no  Function retired  Neuro/Psych No problems in this area  Prior Studies Any changes since last visit?  no  Physicians involved in your care Any changes since last visit?  no   Family History  Problem Relation Age of Onset  . Diabetes Mother   . Heart disease Mother   . Heart disease Father    History   Social History  . Marital Status: Divorced    Spouse Name: N/A    Number of Children: N/A  . Years of Education: N/A   Social History Main Topics  . Smoking status: Former Smoker    Quit date: 08/16/1995  . Smokeless tobacco: Never Used  . Alcohol Use: No  . Drug Use: No  . Sexually Active: None   Other Topics Concern  . None   Social History Narrative  . None   Past Surgical History  Procedure Date  . Abdominal hysterectomy 1980'S  . Cholecystectomy     1990'S  . Tubaligation 1980  . Icd 2003/2007    implanted by Dr Alanda Amass, most recent generator change 2/13 by Dr Johney Frame, Analyze ST study patient   Past Medical History  Diagnosis Date  . Chronic systolic heart failure   . Diabetes mellitus     type 1  . Hypertension   . Stroke 2010    eye doctor said she had TIA  . Ischemic  cardiomyopathy   . Ventricular tachycardia     Polymorphic  . 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   . Atrial fibrillation     controlled with amiodarone, on coumadin  . Coronary artery disease 01/31/2011  . Chronic renal insufficiency   . Lumbar spondylosis 01/11/2012   BP 119/69  Pulse 66  Resp 14  Ht 5\' 3"  (1.6 m)  Wt 209 lb 6.4 oz (94.983 kg)  BMI 37.09 kg/m2  SpO2 98%   Review of Systems  Constitutional: Positive for unexpected weight change.  Gastrointestinal: Positive for nausea, vomiting and constipation.  All other systems reviewed and are negative.       Objective:   Physical Exam Musculoskeletal:  Lumbar back: She exhibits decreased range of motion, tenderness and pain.  Tenderness palpation L4 and L5 paraspinal muscles bilaterally Pain with extension of the lumbar spine but not with flexion reduced extension and lateral bending of the lumbar spine  Neurological: She is alert and oriented to person, place, and time. She has normal strength. She displays atrophy. A sensory deficit is present. She exhibits normal muscle tone. Gait normal.  Reflex Scores:  Tricep reflexes are 2+ on the right side and 2+ on the left side.  Bicep  reflexes are 2+ on the right side and 2+ on the left side.  Brachioradialis reflexes are 2+ on the right side and 2+ on the left side.  Patellar reflexes are 2+ on the right side and 2+ on the left side.  Achilles reflexes are 0 on the right side and 0 on the left side. Decreased sensation in Right foot Intrinsic atrophy of the feet  Skin: Skin is warm and dry.  Psychiatric: She has a normal mood and affect. Her behavior is normal. Judgment and thought content normal.        Assessment & Plan:  1. Lumbar spinal stenosis. The central stenosis is likely causing the right L4 nerve root irritation. I am recommending epidural steroid injection transforaminal route. She will need to come up her Coumadin. I contacted her  cardiologist who stated she would need to be bridged with Lovenox.  2. Lumbar spondylosis L4-5 and L5-S1 levels on my review of the CT.The patient will consider lumbar medial branch blocks however she would need to come off the Coumadin once again and take Lovenox injections in her abdomen. This was quite painful for her last time.  We'll continue tramadol and change gabapentin to topamax , d/c low dose lyrica  3. Right medial heel lesion punctate wound surrounded by mild to moderate erythema nontender. This may be a superficial cellulitis will start some oral medications and instruct the patient to followup with primary care if it does not improve in the next couple days  Return to clinic in one month

## 2012-03-08 NOTE — Patient Instructions (Signed)
I have called in Keflex which is an antibiotic for your foot. If your foot redness has not improved by Thursday or Friday I would call your family doctor to get evaluated

## 2012-03-14 ENCOUNTER — Ambulatory Visit (INDEPENDENT_AMBULATORY_CARE_PROVIDER_SITE_OTHER): Payer: PRIVATE HEALTH INSURANCE | Admitting: *Deleted

## 2012-03-14 DIAGNOSIS — Z7901 Long term (current) use of anticoagulants: Secondary | ICD-10-CM

## 2012-03-14 DIAGNOSIS — I4891 Unspecified atrial fibrillation: Secondary | ICD-10-CM

## 2012-03-28 ENCOUNTER — Encounter: Payer: PRIVATE HEALTH INSURANCE | Admitting: Internal Medicine

## 2012-03-28 ENCOUNTER — Ambulatory Visit: Payer: PRIVATE HEALTH INSURANCE | Admitting: Internal Medicine

## 2012-03-29 ENCOUNTER — Other Ambulatory Visit: Payer: Self-pay | Admitting: Cardiology

## 2012-03-30 ENCOUNTER — Other Ambulatory Visit: Payer: Self-pay | Admitting: *Deleted

## 2012-03-30 DIAGNOSIS — I2510 Atherosclerotic heart disease of native coronary artery without angina pectoris: Secondary | ICD-10-CM

## 2012-03-30 DIAGNOSIS — I5022 Chronic systolic (congestive) heart failure: Secondary | ICD-10-CM

## 2012-03-30 MED ORDER — FUROSEMIDE 20 MG PO TABS: ORAL_TABLET | ORAL | Status: DC

## 2012-03-30 NOTE — Telephone Encounter (Signed)
Refilled lasix

## 2012-04-05 ENCOUNTER — Ambulatory Visit: Payer: PRIVATE HEALTH INSURANCE | Admitting: Cardiology

## 2012-04-06 ENCOUNTER — Encounter (INDEPENDENT_AMBULATORY_CARE_PROVIDER_SITE_OTHER): Payer: PRIVATE HEALTH INSURANCE

## 2012-04-06 ENCOUNTER — Encounter: Payer: Self-pay | Admitting: Internal Medicine

## 2012-04-06 ENCOUNTER — Other Ambulatory Visit (INDEPENDENT_AMBULATORY_CARE_PROVIDER_SITE_OTHER): Payer: PRIVATE HEALTH INSURANCE

## 2012-04-06 ENCOUNTER — Ambulatory Visit (INDEPENDENT_AMBULATORY_CARE_PROVIDER_SITE_OTHER): Payer: PRIVATE HEALTH INSURANCE | Admitting: *Deleted

## 2012-04-06 ENCOUNTER — Ambulatory Visit (INDEPENDENT_AMBULATORY_CARE_PROVIDER_SITE_OTHER): Payer: PRIVATE HEALTH INSURANCE | Admitting: Internal Medicine

## 2012-04-06 VITALS — BP 137/72 | HR 60 | Ht 63.0 in | Wt 206.8 lb

## 2012-04-06 DIAGNOSIS — I472 Ventricular tachycardia: Secondary | ICD-10-CM

## 2012-04-06 DIAGNOSIS — I4891 Unspecified atrial fibrillation: Secondary | ICD-10-CM

## 2012-04-06 DIAGNOSIS — I255 Ischemic cardiomyopathy: Secondary | ICD-10-CM

## 2012-04-06 DIAGNOSIS — R0989 Other specified symptoms and signs involving the circulatory and respiratory systems: Secondary | ICD-10-CM

## 2012-04-06 DIAGNOSIS — I4729 Other ventricular tachycardia: Secondary | ICD-10-CM

## 2012-04-06 DIAGNOSIS — Z7901 Long term (current) use of anticoagulants: Secondary | ICD-10-CM

## 2012-04-06 DIAGNOSIS — I2589 Other forms of chronic ischemic heart disease: Secondary | ICD-10-CM

## 2012-04-06 LAB — ICD DEVICE OBSERVATION
ATRIAL PACING ICD: 98 pct
BAMS-0003: 60 {beats}/min
DEV-0020ICD: NEGATIVE
DEVICE MODEL ICD: 819806
HV IMPEDENCE: 43 Ohm
MODE SWITCH EPISODES: 11
RV LEAD IMPEDENCE ICD: 612.5 Ohm
RV LEAD THRESHOLD: 0.75 V
TOT-0006: 20130212000000
TOT-0007: 1
TOT-0010: 5
TZAT-0004SLOWVT: 8
TZAT-0012SLOWVT: 200 ms
TZAT-0013SLOWVT: 3
TZAT-0018SLOWVT: NEGATIVE
TZON-0003SLOWVT: 340 ms
TZST-0001SLOWVT: 2
TZST-0001SLOWVT: 3
TZST-0003SLOWVT: 40 J

## 2012-04-06 LAB — HEPATIC FUNCTION PANEL
AST: 20 U/L (ref 0–37)
Albumin: 4.1 g/dL (ref 3.5–5.2)
Alkaline Phosphatase: 58 U/L (ref 39–117)
Total Protein: 7.3 g/dL (ref 6.0–8.3)

## 2012-04-06 NOTE — Patient Instructions (Signed)
Your physician wants you to follow-up in: 6 months with Dr. Allred. You will receive a reminder letter in the mail two months in advance. If you don't receive a letter, please call our office to schedule the follow-up appointment.  

## 2012-04-06 NOTE — Progress Notes (Signed)
PCP: [name] Pitter, MD Primary Cardiologist:  [name] is a 67 y.o. female who presents today for routine electrophysiology followup.  Since last being seen in our clinic, the patient reports doing very well.  Today, she denies symptoms of palpitations, chest pain, shortness of breath,  lower extremity edema, dizziness, presyncope, syncope, or ICD shocks.  The patient is otherwise without complaint today.   Past Medical History  Diagnosis Date  . Chronic systolic heart failure   . Diabetes mellitus     type 1  . Hypertension   . Stroke 2010    eye doctor said she had TIA  . Ischemic cardiomyopathy   . Ventricular tachycardia     Polymorphic  . 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   . Atrial fibrillation     controlled with amiodarone, on coumadin  . Coronary artery disease 01/31/2011  . Chronic renal insufficiency   . Lumbar spondylosis 01/11/2012   Past Surgical History  Procedure Date  . Abdominal hysterectomy 1980'S  . Cholecystectomy     1990'S  . Tubaligation 1980  . Icd 2003/2007    implanted by [name] Alanda Amass, most recent generator change 2/13 by [name] Johney Frame, Analyze ST study patient    Current Outpatient Prescriptions  Medication Sig Dispense Refill  . amiodarone (PACERONE) 200 MG tablet Take 1 tablet (200 mg total) by mouth daily.  60 tablet  6  . carvedilol (COREG) 25 MG tablet Take 25 mg by mouth 2 (two) times daily with a meal.      . Cholecalciferol (VITAMIN D3) 2000 UNITS TABS Take 1 tablet by mouth daily.        . furosemide (LASIX) 20 MG tablet Take 20mg  daily--one-half 40mg  tablet daily  30 tablet  3  . furosemide (LASIX) 40 MG tablet TAKE 1 TABLET (40 MG TOTAL) BY MOUTH DAILY.  30 tablet  6  . gabapentin (NEURONTIN) 400 MG capsule       . insulin detemir (LEVEMIR) 100 UNIT/ML injection Inject 60 Units into the skin 2 (two) times daily.       . insulin glulisine (APIDRA) 100 UNIT/ML injection Inject into the skin 3 (three) times  daily before meals. Patient is on a sliding scale.      [name] lisinopril (PRINIVIL,ZESTRIL) 5 MG tablet TAKE 1 AND 1/2 TABS DAILY (7.5MG )  45 tablet  6  . nitroGLYCERIN (NITROSTAT) 0.4 MG SL tablet Place 0.4 mg under the tongue every 5 (five) minutes as needed. For chest pain      . nizatidine (AXID) 150 MG capsule Take 150 mg by mouth at bedtime.       [name] omeprazole (PRILOSEC) 20 MG capsule Take 20 mg by mouth daily.      . rosuvastatin (CRESTOR) 20 MG tablet Take 20 mg by mouth daily.      [name] spironolactone (ALDACTONE) 25 MG tablet 1/2 tablet (total 12.5mg ) daily  30 tablet  2  . traMADol-acetaminophen (ULTRACET) 37.5-325 MG per tablet Take 1 tablet by mouth every 8 (eight) hours as needed for pain.  30 tablet  3  . Vitamin D, Ergocalciferol, (DRISDOL) 50000 UNITS CAPS Take 50,000 Units by mouth. 50,000 units on Monday and 2,000 every day      . warfarin (COUMADIN) 2.5 MG tablet TAKE AS DIRECTED BY COUMADIN CLINIC  30 tablet  2    Physical Exam: Filed Vitals:   04/06/12 1329  BP: 137/72  Pulse: 60  Height: 5\' 3"  (1.6 m)  Weight: 206 lb 12.8 oz (93.804 kg)    GEN- The patient is well appearing, alert and oriented x 3 today.   Head- normocephalic, atraumatic Eyes-  Sclera clear, conjunctiva pink Ears- hearing intact Oropharynx- clear Lungs- Clear to ausculation bilaterally, normal work of breathing Chest- ICD pocket is well healed Heart- Regular rate and rhythm, no murmurs, rubs or gallops, PMI not laterally displaced GI- soft, NT, ND, + BS Extremities- no clubbing, cyanosis, or edema  ICD interrogation- reviewed in detail today,  See PACEART report  Assessment and Plan:  1.  Chronic systolic dysfunction euvolemic today Stable on an appropriate medical regimen Normal ICD function See Pace Art report No changes today bmet today  2. VT Controlled with amiodarone Check LFTs/TFTs today  Follow-up with [name] Shirlee Latch as scheduled.  I will see again in 6 months

## 2012-04-08 ENCOUNTER — Encounter: Payer: Self-pay | Admitting: Physical Medicine & Rehabilitation

## 2012-04-08 ENCOUNTER — Telehealth: Payer: Self-pay | Admitting: Cardiology

## 2012-04-08 ENCOUNTER — Encounter: Payer: PRIVATE HEALTH INSURANCE | Attending: Physical Medicine & Rehabilitation

## 2012-04-08 ENCOUNTER — Ambulatory Visit (HOSPITAL_BASED_OUTPATIENT_CLINIC_OR_DEPARTMENT_OTHER): Payer: PRIVATE HEALTH INSURANCE | Admitting: Physical Medicine & Rehabilitation

## 2012-04-08 VITALS — BP 124/69 | HR 62 | Resp 14 | Ht 63.0 in | Wt 208.4 lb

## 2012-04-08 DIAGNOSIS — Z7901 Long term (current) use of anticoagulants: Secondary | ICD-10-CM | POA: Insufficient documentation

## 2012-04-08 DIAGNOSIS — M545 Low back pain, unspecified: Secondary | ICD-10-CM | POA: Insufficient documentation

## 2012-04-08 DIAGNOSIS — E109 Type 1 diabetes mellitus without complications: Secondary | ICD-10-CM | POA: Insufficient documentation

## 2012-04-08 DIAGNOSIS — Z8673 Personal history of transient ischemic attack (TIA), and cerebral infarction without residual deficits: Secondary | ICD-10-CM | POA: Insufficient documentation

## 2012-04-08 DIAGNOSIS — N189 Chronic kidney disease, unspecified: Secondary | ICD-10-CM | POA: Insufficient documentation

## 2012-04-08 DIAGNOSIS — Z95810 Presence of automatic (implantable) cardiac defibrillator: Secondary | ICD-10-CM | POA: Insufficient documentation

## 2012-04-08 DIAGNOSIS — I2589 Other forms of chronic ischemic heart disease: Secondary | ICD-10-CM | POA: Insufficient documentation

## 2012-04-08 DIAGNOSIS — I2510 Atherosclerotic heart disease of native coronary artery without angina pectoris: Secondary | ICD-10-CM | POA: Insufficient documentation

## 2012-04-08 DIAGNOSIS — R209 Unspecified disturbances of skin sensation: Secondary | ICD-10-CM | POA: Insufficient documentation

## 2012-04-08 DIAGNOSIS — M47817 Spondylosis without myelopathy or radiculopathy, lumbosacral region: Secondary | ICD-10-CM

## 2012-04-08 DIAGNOSIS — M79609 Pain in unspecified limb: Secondary | ICD-10-CM | POA: Insufficient documentation

## 2012-04-08 DIAGNOSIS — I4891 Unspecified atrial fibrillation: Secondary | ICD-10-CM | POA: Insufficient documentation

## 2012-04-08 DIAGNOSIS — M47816 Spondylosis without myelopathy or radiculopathy, lumbar region: Secondary | ICD-10-CM

## 2012-04-08 DIAGNOSIS — I129 Hypertensive chronic kidney disease with stage 1 through stage 4 chronic kidney disease, or unspecified chronic kidney disease: Secondary | ICD-10-CM | POA: Insufficient documentation

## 2012-04-08 NOTE — Patient Instructions (Addendum)
Dr. Alford Highland office will need to put you on Lovenox when you're off your warfarin. You'll need to stop the Lovenox the day of your injection

## 2012-04-08 NOTE — Progress Notes (Signed)
  Subjective:    Patient ID: [name], female    DOB: [birth year], 67 y.o.   MRN: [id number]  HPI    Review of Systems     Objective:   Physical Exam        Assessment & Plan:

## 2012-04-08 NOTE — Telephone Encounter (Signed)
Spoke with pt. Pt is asking if OK to hold coumadin for 5 days prior to back injection scheduled for 04/25/12. I will forward to Dr Shirlee Latch for review.

## 2012-04-08 NOTE — Progress Notes (Signed)
Subjective:    Patient ID: [name], female    DOB: [birth year], 67 y.o.   MRN: [id number]  HPI Radicular pain was not responsive to epidural steroid injection at L4. Tried Lyrica which was not helpful. Tried Topamax which was not tolerated do to diarrhea. Went back on gabapentin which is partially helpful. Cannot tolerate 600 mg dose. Also cannot tolerate taking 400 mg more than twice a day  Pain Inventory Average Pain 8 Pain Right Now 6 My pain is aching  In the last 24 hours, has pain interfered with the following? General activity 6 Relation with others 1 Enjoyment of life 1 What TIME of day is your pain at its worst? daytime Sleep (in general) Good  Pain is worse with: walking, bending, standing and some activites Pain improves with: medication Relief from Meds: 5  Mobility walk without assistance how many minutes can you walk? 15 ability to climb steps?  no do you drive?  yes  Function disabled: date disabled 2007  Neuro/Psych No problems in this area  Prior Studies Any changes since last visit?  no  Physicians involved in your care Any changes since last visit?  no   Family History  Problem Relation Age of Onset  . Diabetes Mother   . Heart disease Mother   . Heart disease Father    History   Social History  . Marital Status: Divorced    Spouse Name: N/A    Number of Children: N/A  . Years of Education: N/A   Social History Main Topics  . Smoking status: Former Smoker    Quit date: 08/16/1995  . Smokeless tobacco: Never Used  . Alcohol Use: No  . Drug Use: No  . Sexually Active: None   Other Topics Concern  . None   Social History Narrative  . None   Past Surgical History  Procedure Date  . Abdominal hysterectomy 1980'S  . Cholecystectomy     1990'S  . Tubaligation 1980  . Icd 2003/2007    implanted by Dr Alanda Amass, most recent generator change 2/13 by Dr Johney Frame, Analyze ST study patient   Past Medical History  Diagnosis  Date  . Chronic systolic heart failure   . Diabetes mellitus     type 1  . Hypertension   . Stroke 2010    eye doctor said she had TIA  . Ischemic cardiomyopathy   . Ventricular tachycardia     Polymorphic  . 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   . Atrial fibrillation     controlled with amiodarone, on coumadin  . Coronary artery disease 01/31/2011  . Chronic renal insufficiency   . Lumbar spondylosis 01/11/2012   BP 124/69  Pulse 62  Resp 14  Ht 5\' 3"  (1.6 m)  Wt 208 lb 6.4 oz (94.53 kg)  BMI 36.92 kg/m2  SpO2 98%   Review of Systems  Constitutional: Positive for unexpected weight change.  Musculoskeletal: Positive for back pain.  All other systems reviewed and are negative.       Objective:   Physical Exam Musculoskeletal:  Lumbar back: She exhibits decreased range of motion, tenderness and pain.  Tenderness palpation L4 and L5 paraspinal muscles bilaterally Pain with extension of the lumbar spine but not with flexion reduced extension and lateral bending of the lumbar spine  Neurological: She is alert and oriented to person, place, and time. She has normal strength. She displays atrophy. A sensory deficit is present. She exhibits normal muscle tone. Gait  normal.  Reflex Scores:  Tricep reflexes are 2+ on the right side and 2+ on the left side.  Bicep reflexes are 2+ on the right side and 2+ on the left side.  Brachioradialis reflexes are 2+ on the right side and 2+ on the left side.  Patellar reflexes are 2+ on the right side and 2+ on the left side.  Achilles reflexes are 0 on the right side and 0 on the left side. Decreased sensation in Right foot Intrinsic atrophy of the feet  Skin: Skin is warm and dry.  Psychiatric: She has a normal mood and affect. Her behavior is normal. Judgment and thought content normal.        Assessment & Plan:  1. Lumbar spinal stenosis. The central stenosis is likely causing the right L4 nerve root irritation. I am  recommending epidural steroid injection transforaminal route. She will need to come up her Coumadin. I contacted her cardiologist who stated she would need to be bridged with Lovenox.  2. Lumbar spondylosis L4-5 and L5-S1 levels on my review of the CT.The patient will consider lumbar medial branch blocks however she would need to come off the Coumadin once again and take Lovenox injections in her abdomen. Discussed MBB again.  Pt feels her ADLs are affected by her pain. 3.  Right medial heel lesion improved after antibiotics

## 2012-04-08 NOTE — Telephone Encounter (Signed)
Pt calling re appt for back injections, needs to go off coumadin for 5 days, pls call [phone]

## 2012-04-11 NOTE — Telephone Encounter (Signed)
She has history of CVA, would need Lovenox bridge.

## 2012-04-11 NOTE — Telephone Encounter (Signed)
Age 67 [place]-1.4-cc 59.78 (12/2011) [place]-2.4-cc 34.87 (07/2011) Wt 94.53 kg Lovenox dose 01/2012 100 mg bid, we will do the same dose with the procedure in February 2014

## 2012-04-11 NOTE — Telephone Encounter (Signed)
Pt advised,verbalized understanding. Back injection is scheduled for 04/26/12 and not 04/25/12. Pt is aware I am forwarding this information to to [facility] for management of this.

## 2012-04-11 NOTE — Telephone Encounter (Signed)
Have called and spoke with pt on phone, will move up appt so we can do lovenox  bridging instructions with her at that time, she has a procedure on 04/26/2012, she states she has done the injections in her belly before.

## 2012-04-18 ENCOUNTER — Ambulatory Visit (INDEPENDENT_AMBULATORY_CARE_PROVIDER_SITE_OTHER): Payer: PRIVATE HEALTH INSURANCE | Admitting: *Deleted

## 2012-04-18 DIAGNOSIS — I4891 Unspecified atrial fibrillation: Secondary | ICD-10-CM

## 2012-04-18 DIAGNOSIS — Z7901 Long term (current) use of anticoagulants: Secondary | ICD-10-CM

## 2012-04-18 LAB — POCT INR: INR: 3

## 2012-04-18 MED ORDER — ENOXAPARIN SODIUM 100 MG/ML ~~LOC~~ SOLN: 100.0000 mg | Freq: Two times a day (BID) | SUBCUTANEOUS | Status: DC

## 2012-04-18 NOTE — Patient Instructions (Addendum)
Age 67  [place]-1.4-cc 59.78 (12/2011)  [place]-2.4-cc 34.87 (07/2011)  Wt 94.53 kg  Lovenox dose 01/2012 100 mg bid, we will do the same dose with the procedure in February 2014 Dr Tawanna Cooler is doing injections  1/29 last coumadin dose 1/30 no coumadin or lovenox 1/31 lovenox 100 mg 8 am and 8 pm 2/1  lovenox 100 mg 8 am and 8 pm 2/2  lovenox 100 mg 8 am and 8 pm 2/3  lovenox 100 mg 8 am  2/4 nothing prior to procedure  Follow MD instructions post procedure, when you start your coumadin back  take an extra 1/2 tablet 2 days, continue lovenox post procedure as directed unless your doctor instructs you otherwise

## 2012-04-23 ENCOUNTER — Other Ambulatory Visit: Payer: Self-pay | Admitting: Cardiology

## 2012-04-26 ENCOUNTER — Encounter: Payer: Self-pay | Admitting: Physical Medicine & Rehabilitation

## 2012-04-26 ENCOUNTER — Encounter: Payer: PRIVATE HEALTH INSURANCE | Attending: Physical Medicine & Rehabilitation

## 2012-04-26 ENCOUNTER — Ambulatory Visit (HOSPITAL_BASED_OUTPATIENT_CLINIC_OR_DEPARTMENT_OTHER): Payer: PRIVATE HEALTH INSURANCE | Admitting: Physical Medicine & Rehabilitation

## 2012-04-26 VITALS — HR 65 | Resp 14 | Ht 63.0 in | Wt 208.0 lb

## 2012-04-26 DIAGNOSIS — Z8673 Personal history of transient ischemic attack (TIA), and cerebral infarction without residual deficits: Secondary | ICD-10-CM | POA: Insufficient documentation

## 2012-04-26 DIAGNOSIS — M47816 Spondylosis without myelopathy or radiculopathy, lumbar region: Secondary | ICD-10-CM

## 2012-04-26 DIAGNOSIS — M545 Low back pain, unspecified: Secondary | ICD-10-CM | POA: Insufficient documentation

## 2012-04-26 DIAGNOSIS — M79609 Pain in unspecified limb: Secondary | ICD-10-CM | POA: Insufficient documentation

## 2012-04-26 DIAGNOSIS — R209 Unspecified disturbances of skin sensation: Secondary | ICD-10-CM | POA: Insufficient documentation

## 2012-04-26 DIAGNOSIS — N189 Chronic kidney disease, unspecified: Secondary | ICD-10-CM | POA: Insufficient documentation

## 2012-04-26 DIAGNOSIS — M47817 Spondylosis without myelopathy or radiculopathy, lumbosacral region: Secondary | ICD-10-CM | POA: Insufficient documentation

## 2012-04-26 DIAGNOSIS — I2510 Atherosclerotic heart disease of native coronary artery without angina pectoris: Secondary | ICD-10-CM | POA: Insufficient documentation

## 2012-04-26 DIAGNOSIS — I129 Hypertensive chronic kidney disease with stage 1 through stage 4 chronic kidney disease, or unspecified chronic kidney disease: Secondary | ICD-10-CM | POA: Insufficient documentation

## 2012-04-26 DIAGNOSIS — Z95810 Presence of automatic (implantable) cardiac defibrillator: Secondary | ICD-10-CM | POA: Insufficient documentation

## 2012-04-26 DIAGNOSIS — I4891 Unspecified atrial fibrillation: Secondary | ICD-10-CM | POA: Insufficient documentation

## 2012-04-26 DIAGNOSIS — E109 Type 1 diabetes mellitus without complications: Secondary | ICD-10-CM | POA: Insufficient documentation

## 2012-04-26 DIAGNOSIS — Z7901 Long term (current) use of anticoagulants: Secondary | ICD-10-CM | POA: Insufficient documentation

## 2012-04-26 DIAGNOSIS — I2589 Other forms of chronic ischemic heart disease: Secondary | ICD-10-CM | POA: Insufficient documentation

## 2012-04-26 NOTE — Progress Notes (Signed)
Bilateral Lumbar L3, L4  medial branch blocks and L 5 dorsal ramus injection under fluoroscopic guidance  Indication: Lumbar pain which is not relieved by medication management or other conservative care and interfering with self-care and mobility.  Informed consent was obtained after describing risks and benefits of the procedure with the patient, this includes bleeding, infection, paralysis and medication side effects.  The patient wishes to proceed and has given written consent.  The patient was placed in prone position.  The lumbar area was marked and prepped with Betadine.  One mL of 1% lidocaine was injected into each of 6 areas into the skin and subcutaneous tissue.  Then a 22-gauge 3.5 inch spinal needle was inserted targeting the junction of the left S1 superior articular process and sacral ala junction. Needle was advanced under fluoroscopic guidance.  Bone contact was made.  Omnipaque 180 was injected x 0.5 mL demonstrating no intravascular uptake.  Then a solution containing one mL of 4 mg per mL dexamethasone and 3 mL of 2% MPF lidocaine was injected x 0.5 mL.  Then the left L5 superior articular process in transverse process junction was targeted.  Bone contact was made.  Omnipaque 180 was injected x 0.5 mL demonstrating no intravascular uptake. Then a solution containing one mL of 4 mg per mL dexamethasone and 3 mL of 2% MPF lidocaine was injected x 0.5 mL.  Then the left L4 superior articular process in transverse process junction was targeted.  Bone contact was made.  Omnipaque 180 was injected x 0.5 mL demonstrating no intravascular uptake.  Then a solution containing one mL of 4 mg per mL dexamethasone and 3 mL if 2% MPF lidocaine was injected x 0.5 mL.  This same procedure was performed on the right side using the same needle, technique and injectate.  Patient tolerated procedure well.  Post procedure instructions were given. 

## 2012-04-26 NOTE — Progress Notes (Signed)
  PROCEDURE RECORD [facility]   Name: [name] DOB:Sep 12, 1945 MRN: [id number]  Date:04/26/2012  Physician: Claudette Laws, MD    Nurse/CMA: Kelli Churn, CMA/Shumaker, RN  Allergies:  Allergies  Allergen Reactions  . Darvon Other (See Comments)    indigestion  . Promethazine Hcl Other (See Comments)    hyperactivity  . Plavix (Clopidogrel Bisulfate) Rash    Consent Signed: yes  Is patient diabetic? yes  CBG today? 240  Pregnant: no LMP: No LMP recorded. Patient has had a hysterectomy. (age [age])  Anticoagulants: yes (Took dose of lovenox 04/25/12, has been off coumadin for 5 days.) Anti-inflammatory: no Antibiotics: no  Procedure:bilateral Medical Branch Block  Position: Prone Start Time: 1:36  End Time: 1:48  Fluoro Time: 38 seconds  RN/CMA Jearldine Cassady, CMA Shumaker, RN    Time 103 1:52    BP 141/77 111/62    Pulse 65 75    Respirations 14 14    O2 Sat 98 97    S/S 6 6    Pain Level 6/10 6/10     D/C home with daughter, patient A & O X 3, D/C instructions reviewed, and sits independently.

## 2012-04-26 NOTE — Patient Instructions (Signed)
May resume Coumadin tonight

## 2012-05-03 ENCOUNTER — Ambulatory Visit (INDEPENDENT_AMBULATORY_CARE_PROVIDER_SITE_OTHER): Payer: PRIVATE HEALTH INSURANCE | Admitting: *Deleted

## 2012-05-03 ENCOUNTER — Other Ambulatory Visit: Payer: Self-pay | Admitting: Internal Medicine

## 2012-05-03 ENCOUNTER — Encounter (INDEPENDENT_AMBULATORY_CARE_PROVIDER_SITE_OTHER): Payer: PRIVATE HEALTH INSURANCE

## 2012-05-03 ENCOUNTER — Encounter: Payer: Self-pay | Admitting: Internal Medicine

## 2012-05-03 DIAGNOSIS — Z7901 Long term (current) use of anticoagulants: Secondary | ICD-10-CM

## 2012-05-03 DIAGNOSIS — I4891 Unspecified atrial fibrillation: Secondary | ICD-10-CM

## 2012-05-03 DIAGNOSIS — I472 Ventricular tachycardia: Secondary | ICD-10-CM

## 2012-05-03 DIAGNOSIS — I2589 Other forms of chronic ischemic heart disease: Secondary | ICD-10-CM

## 2012-05-03 DIAGNOSIS — R0989 Other specified symptoms and signs involving the circulatory and respiratory systems: Secondary | ICD-10-CM

## 2012-05-03 DIAGNOSIS — I4729 Other ventricular tachycardia: Secondary | ICD-10-CM

## 2012-05-03 DIAGNOSIS — I255 Ischemic cardiomyopathy: Secondary | ICD-10-CM

## 2012-05-03 LAB — ICD DEVICE OBSERVATION
AL THRESHOLD: 0.75 V
ATRIAL PACING ICD: 99 pct
BAMS-0001: 170 {beats}/min
BAMS-0003: 60 {beats}/min
DEV-0020ICD: NEGATIVE
FVT: 0
MODE SWITCH EPISODES: 0
PACEART VT: 0
RV LEAD AMPLITUDE: 11.8 mv
RV LEAD IMPEDENCE ICD: 587.5 Ohm
RV LEAD THRESHOLD: 1 V
TOT-0006: 20130212000000
TOT-0009: 1
TZAT-0001SLOWVT: 1
TZAT-0004SLOWVT: 8
TZAT-0012SLOWVT: 200 ms
TZAT-0013SLOWVT: 3
TZAT-0020SLOWVT: 1 ms
TZON-0005SLOWVT: 6
TZST-0001SLOWVT: 4
TZST-0003SLOWVT: 30 J
TZST-0003SLOWVT: 40 J
VENTRICULAR PACING ICD: 5.5 pct

## 2012-05-03 LAB — POCT INR: INR: 2.6

## 2012-05-03 NOTE — Progress Notes (Signed)
ICD check for research with industry

## 2012-05-13 ENCOUNTER — Encounter: Payer: PRIVATE HEALTH INSURANCE | Admitting: Internal Medicine

## 2012-05-17 ENCOUNTER — Ambulatory Visit (INDEPENDENT_AMBULATORY_CARE_PROVIDER_SITE_OTHER): Payer: PRIVATE HEALTH INSURANCE | Admitting: Pharmacist

## 2012-05-17 DIAGNOSIS — Z7901 Long term (current) use of anticoagulants: Secondary | ICD-10-CM

## 2012-05-17 DIAGNOSIS — I4891 Unspecified atrial fibrillation: Secondary | ICD-10-CM

## 2012-05-17 LAB — POCT INR: INR: 2.9

## 2012-05-20 ENCOUNTER — Other Ambulatory Visit: Payer: Self-pay | Admitting: Cardiology

## 2012-05-24 ENCOUNTER — Ambulatory Visit: Payer: PRIVATE HEALTH INSURANCE | Admitting: Physical Medicine & Rehabilitation

## 2012-06-06 ENCOUNTER — Encounter: Payer: PRIVATE HEALTH INSURANCE | Attending: Physical Medicine & Rehabilitation

## 2012-06-06 ENCOUNTER — Ambulatory Visit: Payer: PRIVATE HEALTH INSURANCE | Admitting: Physical Medicine & Rehabilitation

## 2012-06-13 ENCOUNTER — Ambulatory Visit (INDEPENDENT_AMBULATORY_CARE_PROVIDER_SITE_OTHER): Payer: PRIVATE HEALTH INSURANCE | Admitting: *Deleted

## 2012-06-13 DIAGNOSIS — Z7901 Long term (current) use of anticoagulants: Secondary | ICD-10-CM

## 2012-06-13 DIAGNOSIS — I4891 Unspecified atrial fibrillation: Secondary | ICD-10-CM

## 2012-06-13 LAB — POCT INR: INR: 3.1

## 2012-06-19 ENCOUNTER — Other Ambulatory Visit: Payer: Self-pay | Admitting: Cardiology

## 2012-06-20 ENCOUNTER — Ambulatory Visit (HOSPITAL_BASED_OUTPATIENT_CLINIC_OR_DEPARTMENT_OTHER): Payer: PRIVATE HEALTH INSURANCE | Admitting: Physical Medicine & Rehabilitation

## 2012-06-20 ENCOUNTER — Encounter: Payer: Self-pay | Admitting: Physical Medicine & Rehabilitation

## 2012-06-20 ENCOUNTER — Ambulatory Visit (HOSPITAL_COMMUNITY)
Admission: RE | Admit: 2012-06-20 | Discharge: 2012-06-20 | Disposition: A | Discharge status: Home / Self Care | Payer: PRIVATE HEALTH INSURANCE | Source: Ambulatory Visit | Attending: Physical Medicine & Rehabilitation | Admitting: Physical Medicine & Rehabilitation

## 2012-06-20 ENCOUNTER — Encounter: Payer: PRIVATE HEALTH INSURANCE | Attending: Physical Medicine & Rehabilitation

## 2012-06-20 VITALS — BP 124/67 | HR 65 | Resp 14 | Ht 63.0 in | Wt 208.6 lb

## 2012-06-20 DIAGNOSIS — M25561 Pain in right knee: Secondary | ICD-10-CM

## 2012-06-20 DIAGNOSIS — I2510 Atherosclerotic heart disease of native coronary artery without angina pectoris: Secondary | ICD-10-CM | POA: Insufficient documentation

## 2012-06-20 DIAGNOSIS — R609 Edema, unspecified: Secondary | ICD-10-CM | POA: Insufficient documentation

## 2012-06-20 DIAGNOSIS — M47817 Spondylosis without myelopathy or radiculopathy, lumbosacral region: Secondary | ICD-10-CM

## 2012-06-20 DIAGNOSIS — I129 Hypertensive chronic kidney disease with stage 1 through stage 4 chronic kidney disease, or unspecified chronic kidney disease: Secondary | ICD-10-CM | POA: Insufficient documentation

## 2012-06-20 DIAGNOSIS — M545 Low back pain, unspecified: Secondary | ICD-10-CM | POA: Insufficient documentation

## 2012-06-20 DIAGNOSIS — M25569 Pain in unspecified knee: Secondary | ICD-10-CM | POA: Insufficient documentation

## 2012-06-20 DIAGNOSIS — M47816 Spondylosis without myelopathy or radiculopathy, lumbar region: Secondary | ICD-10-CM

## 2012-06-20 DIAGNOSIS — I4891 Unspecified atrial fibrillation: Secondary | ICD-10-CM | POA: Insufficient documentation

## 2012-06-20 DIAGNOSIS — M948X9 Other specified disorders of cartilage, unspecified sites: Secondary | ICD-10-CM | POA: Insufficient documentation

## 2012-06-20 DIAGNOSIS — M25469 Effusion, unspecified knee: Secondary | ICD-10-CM | POA: Insufficient documentation

## 2012-06-20 DIAGNOSIS — N189 Chronic kidney disease, unspecified: Secondary | ICD-10-CM | POA: Insufficient documentation

## 2012-06-20 DIAGNOSIS — I2589 Other forms of chronic ischemic heart disease: Secondary | ICD-10-CM | POA: Insufficient documentation

## 2012-06-20 DIAGNOSIS — E109 Type 1 diabetes mellitus without complications: Secondary | ICD-10-CM | POA: Insufficient documentation

## 2012-06-20 NOTE — Progress Notes (Signed)
Subjective:    Patient ID: [name], female    DOB: 1945-07-31, 67 y.o.   MRN: [id number]  HPI Patient with at least one year history of low back pain. It has been more constant over the last year. Initially had left lower extremity pain now has right lower ext pain.Has toe numbness on the right side only. Has a hard type feeling in the right lateral leg. No numbness on the left side. Past history significant for diabetes. Has had some weight gain over the last year. No physical therapy. Partial relief from hydrocodone. No specific imaging studies. Reviewed CT of the abdomen and pelvis and there was evidence of lumbar spondylosis at L34 and L4-5 levels Lumbar transforaminal injection L4-5 right on 02/09/2012. Some benefit Bilateral lumbar L3-L4-L5 medial branch blocks February 4,2014 Not Helpful  Chief complaint today is right knee pain, no trauma. No history of gout. No infections no fevers. Pain Inventory Average Pain 9 Pain Right Now 9 My pain is aching  In the last 24 hours, has pain interfered with the following? General activity 9 Relation with others 5 Enjoyment of life 8 What TIME of day is your pain at its worst? morning evening and night Sleep (in general) Fair  Pain is worse with: walking, bending, sitting, standing and some activites Pain improves with: medication Relief from Meds: 3  Mobility walk without assistance how many minutes can you walk? 15 ability to climb steps?  yes do you drive?  yes  Function disabled: date disabled 03/29/05 I need assistance with the following:  household duties  Neuro/Psych No problems in this area  Prior Studies Any changes since last visit?  no  Physicians involved in your care Any changes since last visit?  no   Family History  Problem Relation Age of Onset  . Diabetes Mother   . Heart disease Mother   . Heart disease Father    History   Social History  . Marital Status: Divorced    Spouse Name: N/A     Number of Children: N/A  . Years of Education: N/A   Social History Main Topics  . Smoking status: Former Smoker    Quit date: 08/16/1995  . Smokeless tobacco: Never Used  . Alcohol Use: No  . Drug Use: No  . Sexually Active: None   Other Topics Concern  . None   Social History Narrative  . None   Past Surgical History  Procedure Laterality Date  . Abdominal hysterectomy  1980'S  . Cholecystectomy      1990'S  . Tubaligation  1980  . Icd  2003/2007    implanted by Dr Alanda Amass, most recent generator change 2/13 by Dr Johney Frame, Analyze ST study patient   Past Medical History  Diagnosis Date  . Chronic systolic heart failure   . Diabetes mellitus     type 1  . Hypertension   . Stroke 2010    eye doctor said she had TIA  . Ischemic cardiomyopathy   . Ventricular tachycardia     Polymorphic  . 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   . Atrial fibrillation     controlled with amiodarone, on coumadin  . Coronary artery disease 01/31/2011  . Chronic renal insufficiency   . Lumbar spondylosis 01/11/2012   BP 124/67  Pulse 65  Resp 14  Ht 5\' 3"  (1.6 m)  Wt 208 lb 9.6 oz (94.62 kg)  BMI 36.96 kg/m2  SpO2 94%   Review of Systems  Constitutional: Positive for unexpected weight change.  Musculoskeletal: Positive for back pain.  All other systems reviewed and are negative.       Objective:   Physical Exam  Right knee has swelling she has bilateral peripheral edema as well Mild pain with range of motion right knee with mildly decreased end range flexion Hip has good range of motion. No evidence of erythema or induration around the knee. No tenderness to palpation around the knee.      Assessment & Plan:  1. Right knee effusion this is her major pain complaint. Will attempt aspiration given that she apparently has an effusion.  Attempted aspiration under sterile conditions using sterile prep and drape after obtaining written consent. Infiltrated  skin and subcutaneous tissue 1% lidocaine x2 ML and then inserted a 20-gauge 1.5 inch needle. No synovial fluid was aspirated. Will reschedule under ultrasound guidance

## 2012-06-21 ENCOUNTER — Telehealth: Payer: Self-pay | Admitting: Cardiology

## 2012-06-21 ENCOUNTER — Telehealth: Payer: Self-pay

## 2012-06-21 NOTE — Telephone Encounter (Signed)
New problem   Pt will be having a surgery done on her knee 07/14/12 and need to know when she have to start taking Novlox. Please call pt concerning this matter.

## 2012-06-21 NOTE — Telephone Encounter (Signed)
Dr Kathlyn [place] office called and would like to know, Does patient need to come off her coumadin and go on lovenox for her knee aspiration?  Patient called them with this question.  Is there some miscommunication?

## 2012-06-21 NOTE — Telephone Encounter (Signed)
Talked with Carollee Herter at Dr Wynn Banker office who will check with Dr Wynn Banker to see if he wants pt off her coumadin  Talked with pt and she states MD said for her to be off coumadin and will need to be bridged. Pt instructed will call her back with directions when order obtained from her Doctor and pt states understanding.

## 2012-06-21 NOTE — Telephone Encounter (Signed)
No need to come off the Coumadin for knee injections

## 2012-06-22 ENCOUNTER — Telehealth: Payer: Self-pay | Admitting: Physical Medicine & Rehabilitation

## 2012-06-22 ENCOUNTER — Other Ambulatory Visit: Payer: Self-pay | Admitting: Cardiology

## 2012-06-22 NOTE — Telephone Encounter (Signed)
Patient wants to know if she needs to come off her coumadin for her knee injection on 07/14/12.  Please call her.

## 2012-06-22 NOTE — Telephone Encounter (Signed)
Pt again called to inform her that she did not need to be off her coumadin and she states understanding  of these instructions.

## 2012-06-22 NOTE — Telephone Encounter (Signed)
Talked with pt and informed pt that had received message from [facility] at Dr Wynn Banker office that she did not need to hold coumadin for knee aspiration and pt states she has further questions and instructed to call and speak with Carollee Herter at Dr Wynn Banker office  Received call from [place] that pt did not need to hold her coumadin for knee aspiration nor if she were to get Cortisone injection.

## 2012-06-22 NOTE — Telephone Encounter (Signed)
Patient informed she does not need to come off coumadin per Dr Wynn Banker.

## 2012-06-22 NOTE — Telephone Encounter (Signed)
[name] at [employer] informed patient does not need to come our coumadin for knee injections.

## 2012-07-08 ENCOUNTER — Telehealth: Payer: Self-pay | Admitting: Cardiology

## 2012-07-08 MED ORDER — NITROGLYCERIN 0.4 MG SL SUBL: 0.4000 mg | SUBLINGUAL_TABLET | SUBLINGUAL | Status: DC | PRN

## 2012-07-08 NOTE — Telephone Encounter (Signed)
**Note De-Identified [name] Obfuscation** RX sent to [facility] to fill, pt is aware.

## 2012-07-08 NOTE — Telephone Encounter (Signed)
New problem     Pt wants to know if dr Shirlee Latch can call in a new prescription for nitroglycerine 4mg  to [address]

## 2012-07-14 ENCOUNTER — Encounter: Payer: Self-pay | Admitting: Physical Medicine & Rehabilitation

## 2012-07-14 ENCOUNTER — Telehealth: Payer: Self-pay | Admitting: Pharmacist

## 2012-07-14 ENCOUNTER — Encounter: Payer: PRIVATE HEALTH INSURANCE | Attending: Physical Medicine & Rehabilitation

## 2012-07-14 ENCOUNTER — Ambulatory Visit (HOSPITAL_BASED_OUTPATIENT_CLINIC_OR_DEPARTMENT_OTHER): Payer: PRIVATE HEALTH INSURANCE | Admitting: Physical Medicine & Rehabilitation

## 2012-07-14 VITALS — BP 114/65 | HR 64 | Resp 14 | Ht 63.0 in | Wt 207.6 lb

## 2012-07-14 DIAGNOSIS — M25569 Pain in unspecified knee: Secondary | ICD-10-CM | POA: Insufficient documentation

## 2012-07-14 DIAGNOSIS — M25462 Effusion, left knee: Secondary | ICD-10-CM

## 2012-07-14 DIAGNOSIS — M25469 Effusion, unspecified knee: Secondary | ICD-10-CM

## 2012-07-14 NOTE — Telephone Encounter (Signed)
Pt aware will need Lovenox bridge for procedure.  She has a Coumadin clinic appt on 4/28.  Will give her instructions at that time.

## 2012-07-14 NOTE — Telephone Encounter (Signed)
Message copied by Velda Shell on Thu Jul 14, 2012  2:30 PM ------      Message from: Laurey Morale      Created: Thu Jul 14, 2012 12:22 PM       Has atrial fibrillation with history of CVA.  Should have Lovenox bridging while off coumadin.       ----- Message -----         From: Erick Colace, MD         Sent: 07/14/2012  11:37 AM           To: Laurey Morale, MD            Candace patient come off of Coumadin for an epidural steroid injection      She did need to be off it for 5-7 days and have an INR checked within 24 hours of the injection with INR less than 1.4       ------

## 2012-07-14 NOTE — Progress Notes (Signed)
Knee injection Right under ultrasound guidance  Indication:R Knee pain not relieved by medication management and other conservative care.  Informed consent was obtained after describing risks and benefits of the procedure with the patient, this includes bleeding, bruising, infection and medication side effects. The patient wishes to proceed and has given written consent. The patient was placed in a recumbent position. The medial aspect of the knee was marked and prepped with Betadine and alcohol. It was then entered with a 25-gauge 1-1/2 inch needle and 1 mL of 1% lidocaine was injected into the skin and subcutaneous tissue. Then another 18 gauge 1.5 inch needle was inserted into the knee joint. After negative draw back for blood,aspiration of 15cc clear synovial fluid,  a solution containing one ML of 40 mg per mL depomedrol and 4 mL of 1% lidocaine were injected. The patient tolerated the procedure well. Post procedure instructions were given. Fluid sent for gm stain c and s , crystals

## 2012-07-14 NOTE — Patient Instructions (Signed)
We will send off the fluid from your left knee to the laboratory for analysis Check left knee x-ray Schedule for right L4-L5 transforaminal epidural

## 2012-07-15 ENCOUNTER — Telehealth: Payer: Self-pay | Admitting: *Deleted

## 2012-07-15 LAB — SYNOVIAL FLUID PANEL
Crystals, Fluid: NONE SEEN
Lymphocytes-Synovial Fld: 28 % — ABNORMAL HIGH (ref 0–20)
Neutrophil, Synovial: 6 % (ref 0–25)
WBC, Synovial: 5300 cu mm — ABNORMAL HIGH (ref 0–200)

## 2012-07-15 NOTE — Telephone Encounter (Signed)
Please see phone note from 4/24.  Pt has already been contacted and will dose lovenox bridge at her appt on 4/28.

## 2012-07-15 NOTE — Telephone Encounter (Signed)
Dr Wynn Banker, I contacted Dr Shirlee Latch and we received message from their pharmacist, see previous note.

## 2012-07-15 NOTE — Telephone Encounter (Signed)
(  From Dr Wynn Banker:   Please coordinate with Dr. Kemper Durie office to make sure this patient is bridged on Lovenox during the one week at she will be off warfarin. She will need to stop Lovenox the morning of the procedure    I do not know the contact person at his office )  [name] is going to have a lumbar epidural steroid injection on 08/11/12 with Dr Wynn Banker.  She will need to be off her coumadin for 5 days prior to injection.  During that time Dr Wynn Banker, per his note above, would like her to be bridged with Lovenox during the 5 days she is off her coumadin and no dose on the day of the procedure.

## 2012-07-17 LAB — BODY FLUID CULTURE
Gram Stain: NONE SEEN
Gram Stain: NONE SEEN
Organism ID, Bacteria: NO GROWTH

## 2012-07-18 ENCOUNTER — Encounter: Payer: Self-pay | Admitting: Cardiology

## 2012-07-18 ENCOUNTER — Ambulatory Visit (INDEPENDENT_AMBULATORY_CARE_PROVIDER_SITE_OTHER): Payer: PRIVATE HEALTH INSURANCE | Admitting: *Deleted

## 2012-07-18 ENCOUNTER — Ambulatory Visit (INDEPENDENT_AMBULATORY_CARE_PROVIDER_SITE_OTHER): Payer: PRIVATE HEALTH INSURANCE | Admitting: Cardiology

## 2012-07-18 ENCOUNTER — Other Ambulatory Visit: Payer: Self-pay | Admitting: Cardiology

## 2012-07-18 VITALS — BP 126/70 | HR 64 | Ht 63.0 in | Wt 205.0 lb

## 2012-07-18 DIAGNOSIS — I255 Ischemic cardiomyopathy: Secondary | ICD-10-CM

## 2012-07-18 DIAGNOSIS — I4891 Unspecified atrial fibrillation: Secondary | ICD-10-CM

## 2012-07-18 DIAGNOSIS — I2589 Other forms of chronic ischemic heart disease: Secondary | ICD-10-CM

## 2012-07-18 DIAGNOSIS — E119 Type 2 diabetes mellitus without complications: Secondary | ICD-10-CM

## 2012-07-18 DIAGNOSIS — I2510 Atherosclerotic heart disease of native coronary artery without angina pectoris: Secondary | ICD-10-CM

## 2012-07-18 DIAGNOSIS — I472 Ventricular tachycardia: Secondary | ICD-10-CM

## 2012-07-18 DIAGNOSIS — Z7901 Long term (current) use of anticoagulants: Secondary | ICD-10-CM

## 2012-07-18 DIAGNOSIS — I4729 Other ventricular tachycardia: Secondary | ICD-10-CM

## 2012-07-18 LAB — HEPATIC FUNCTION PANEL
AST: 14 U/L (ref 0–37)
Albumin: 4.1 g/dL (ref 3.5–5.2)
Alkaline Phosphatase: 50 U/L (ref 39–117)
Bilirubin, Direct: 0 mg/dL (ref 0.0–0.3)
Total Protein: 7.8 g/dL (ref 6.0–8.3)

## 2012-07-18 LAB — LIPID PANEL
Cholesterol: 143 mg/dL (ref 0–200)
LDL Cholesterol: 71 mg/dL (ref 0–99)
Triglycerides: 70 mg/dL (ref 0.0–149.0)

## 2012-07-18 LAB — TSH: TSH: 1.72 u[IU]/mL (ref 0.35–5.50)

## 2012-07-18 LAB — BASIC METABOLIC PANEL
BUN: 67 mg/dL — ABNORMAL HIGH (ref 6–23)
Calcium: 9.2 mg/dL (ref 8.4–10.5)
Chloride: 102 mEq/L (ref 96–112)
Creatinine, Ser: 2.3 mg/dL — ABNORMAL HIGH (ref 0.4–1.2)

## 2012-07-18 MED ORDER — SPIRONOLACTONE 25 MG PO TABS: 25.0000 mg | ORAL_TABLET | Freq: Every day | ORAL | Status: DC

## 2012-07-18 MED ORDER — ENOXAPARIN SODIUM 100 MG/ML ~~LOC~~ SOLN: 100.0000 mg | Freq: Two times a day (BID) | SUBCUTANEOUS | Status: DC

## 2012-07-18 NOTE — Progress Notes (Signed)
Patient ID: [name], female   DOB: Dec 31, 1945, 67 y.o.   MRN: [id number] PCP: Dr. Mirna Mires  67 yo with history of CAD, ischemic cardiomyopathy, and atrial fibrillation presents for cardiology followup.  Patient was hospitalized in 11/12 at [facility] for VT with ICD discharge.  She had a left heart cath showing patent stents.  EF was 35-40% by echo.  She was started on amiodarone.    Since may last appointment with her, she has been stable.  She can walk on flat ground without dyspnea.  She is short of breath walking up a flight of steps.  No PND or orthopnea.  No chest pain.  Weight is stable compared to last appointment.  She does snore and has daytime sleepiness but did not   ECG: a-paced, old inferior MI, LVH, nonspecific T wave changes.   Labs (10/12): LDL 73, HDL 44 Labs (11/12): K 3.9, creatinine 1.1, LFTs normal, TSH normal Labs (12/12): K 3.9, creatinine 1.5, proBNP 18 Labs (1/13): LDL 82, HDL 57 Labs (2/13): K 4.3, creatinine 1.5 Labs (5/13): creatinine 2.4 => 1.7, LFTs normal, TSH normal, proBNP 18 Labs (6/13): K 4.2, creatinine 1.7 Labs (10/13): K 4.3, creatinine 1.4 Labs (4/14); LFTs normal, TSH normal  PMH: 1. Diabetes mellitus 2. CVA, TIA in 2010 3. HTN 4. CKD 5. H/o TAH 6. H/o CCY 7. Sciatica 8. Atrial fibrillation 9. CAD: s/p LAD and RCA PCI.  Last LHC in 11/12 with patent proximal LAD stent, ostial 70% D1 (jailed by stent), mild LAD stent patent, patent RCA stents, EF 40% with global hypokinesis.  10. Ischemic cardiomyopathy: Echo (10/12): EF 35-40%, moderate focal basal septal hypertrophy, inferior akinesis, grade I diastolic dysfunction, moderate aortic insufficiency. St Jude dual chamber ICD.  11. Aortic insufficiency: moderate.  12. 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   SH: Divorced.  3 children.  Quit smoking in 1997. Lives in [place].  [place].   FH: CAD  ROS: All systems reviewed and negative except as per HPI.   Current Outpatient  Prescriptions  Medication Sig Dispense Refill  . amiodarone (PACERONE) 200 MG tablet Take 1 tablet (200 mg total) by mouth daily.  60 tablet  6  . carvedilol (COREG) 25 MG tablet TAKE 2 TABLETS BY MOUTH TWICE DAILY WITH A MEAL.  120 tablet  6  . Cholecalciferol (VITAMIN D3) 2000 UNITS TABS Take 1 tablet by mouth daily.        . CRESTOR 20 MG tablet TAKE 1 TABLET BY MOUTH EVERY DAY  30 tablet  1  . enoxaparin (LOVENOX) 100 MG/ML injection Inject 1 mL (100 mg total) into the skin every 12 (twelve) hours.  10 Syringe  1  . gabapentin (NEURONTIN) 400 MG capsule       . insulin detemir (LEVEMIR) 100 UNIT/ML injection Inject 60 Units into the skin 2 (two) times daily.       . insulin glulisine (APIDRA) 100 UNIT/ML injection Inject into the skin 3 (three) times daily before meals. Patient is on a sliding scale.      [name] lisinopril (PRINIVIL,ZESTRIL) 5 MG tablet TAKE 1 AND 1/2 TABS DAILY (7.5MG )  45 tablet  6  . nitroGLYCERIN (NITROSTAT) 0.4 MG SL tablet Place 1 tablet (0.4 mg total) under the tongue every 5 (five) minutes as needed. For chest pain  25 tablet  1  . nizatidine (AXID) 150 MG capsule Take 150 mg by mouth at bedtime.       [name] omeprazole (PRILOSEC) 20  MG capsule Take 20 mg by mouth daily.      . traMADol-acetaminophen (ULTRACET) 37.5-325 MG per tablet Take 1 tablet by mouth every 8 (eight) hours as needed for pain.  30 tablet  3  . Vitamin D, Ergocalciferol, (DRISDOL) 50000 UNITS CAPS Take 50,000 Units by mouth. 50,000 units on Monday and 2,000 every day      . warfarin (COUMADIN) 2.5 MG tablet TAKE AS DIRECTED BY COUMADIN CLINIC  30 tablet  2  . furosemide (LASIX) 20 MG tablet TAKE 1 TABLET (20MG ) DAILY  30 tablet  6  . spironolactone (ALDACTONE) 25 MG tablet Take 1 tablet (25 mg total) by mouth daily.  30 tablet  6   No current facility-administered medications for this visit.    BP 126/70  Pulse 64  Ht 5\' 3"  (1.6 m)  Wt 205 lb (92.987 kg)  BMI 36.32 kg/m2 General: NAD, obese Neck:  Thick, JVP difficult but does not seem elevated, no thyromegaly or thyroid nodule.  Lungs: Clear to auscultation bilaterally with normal respiratory effort. CV: Nondisplaced PMI.  Heart regular S1/S2, no S3/S4, 1/6 SEM.  Trace ankle edema.  No carotid bruit.  Normal pedal pulses.  Abdomen: Soft, nontender, no hepatosplenomegaly, no distention.  Neurologic: Alert and oriented x 3.  Psych: Normal affect. Extremities: No clubbing or cyanosis.   Assessment/Plan  1. Atrial fibrillation  Paroxysmal. Continue warfarin. Given history of CVA, needs Lovenox bridge if she has to stop warfarin at any point of time. Maintaining NSR with amiodarone. TSH and LFTs normal in 1/14, will repeat today.  She will also need a yearly eye exam.  2. Coronary artery disease   Nonobstructive disease on last cath. As she is on warfarin also with stable CAD, she is not on aspirin. Continue statin, check lipids today.  3. Ventricular tachycardia On amiodarone with suppression of VT.  4. Ischemic cardiomyopathy NYHA class II symptoms.  She is not volume overloaded on exam.  - Continue current doses of Lasix, Coreg, and lisinopril.  - Increase spironolactone to 25 mg daily.  - BMET today and in 10 days.  5. OSA Symptoms concerning for sleep apnea.  She has a thick neck.  She wants to hold off on sleep study for now.   [name] 07/18/2012

## 2012-07-18 NOTE — Patient Instructions (Addendum)
Increase spironolactone to 25mg  daily.  Your physician recommends that you have  lab work today--lipid profile/ liver profile/BMET/HGB A1C/TSH.  Your physician recommends that you return for lab work in: about 10 days--BMET.  Your physician has requested that you have an echocardiogram. Echocardiography is a painless test that uses sound waves to create images of your heart. It provides your doctor with information about the size and shape of your heart and how well your heart's chambers and valves are working. This procedure takes approximately one hour. There are no restrictions for this procedure. In about 10 days when you have lab done.   Your physician recommends that you schedule a follow-up appointment in: 3 months with Dr Shirlee Latch.

## 2012-07-18 NOTE — Patient Instructions (Addendum)
lovenox bridging for procedure 08/11/2012, spinal injection for pain  5/16 last dose of coumadin 5/17 no coumadin or lovenox 5/18 lovenox 100 mg 9 am and 9 pm 5/19 lovenox 100 mg 9 am and 9 pm 5/20 lovenox 100 mg 9 am and 9 pm 5/21 lovenox 100 mg 9 am   When md says its ok to start back on your coumadin take an extra 1/2 tablet for 2 days, then resume your prior dose,  also continue you lovenox until you return to coumadin clinic  Patient knowledgeable with this procedure of bridging with lovenox, she has done several times

## 2012-07-22 ENCOUNTER — Telehealth: Payer: Self-pay | Admitting: Cardiology

## 2012-07-22 DIAGNOSIS — I2589 Other forms of chronic ischemic heart disease: Secondary | ICD-10-CM

## 2012-07-22 DIAGNOSIS — I4891 Unspecified atrial fibrillation: Secondary | ICD-10-CM

## 2012-07-22 MED ORDER — SPIRONOLACTONE 25 MG PO TABS: 12.5000 mg | ORAL_TABLET | Freq: Every day | ORAL | Status: DC

## 2012-07-22 NOTE — Telephone Encounter (Signed)
Patient states Dr. Shirlee Latch increased dosage of Spironolactone to 25mg  daily on 4/28. Since that time, patient reporting increased weakness, dizziness, jittery feeling, blurry vision and the feeling that her legs will give out.  Weight on 4/28 = 205#, Weight on 5/2 = 201#. BP today 120/70. UOP "good and normal".  Denies other cardiac symptomology. Dr. Patty Sermons reviewed and decreased dosage of Spironolactone to 12.5mg  daily. Will notify Dr. Shirlee Latch of change in dosage. PE

## 2012-07-22 NOTE — Telephone Encounter (Signed)
New Prob   Pts dose of SPIRONOLACTONE was altered at last visit and pt is now experiencing some side effects. Would like to speak to nurse regarding this.

## 2012-07-25 ENCOUNTER — Other Ambulatory Visit: Payer: Self-pay | Admitting: *Deleted

## 2012-07-27 ENCOUNTER — Emergency Department (HOSPITAL_COMMUNITY): Payer: PRIVATE HEALTH INSURANCE

## 2012-07-27 ENCOUNTER — Encounter (HOSPITAL_COMMUNITY): Payer: Self-pay | Admitting: Emergency Medicine

## 2012-07-27 ENCOUNTER — Emergency Department (HOSPITAL_COMMUNITY)
Admission: EM | Admit: 2012-07-27 | Discharge: 2012-07-27 | Disposition: A | Discharge status: Home / Self Care | Payer: PRIVATE HEALTH INSURANCE | Attending: Emergency Medicine | Admitting: Emergency Medicine

## 2012-07-27 DIAGNOSIS — S3981XA Other specified injuries of abdomen, initial encounter: Secondary | ICD-10-CM | POA: Insufficient documentation

## 2012-07-27 DIAGNOSIS — Z79899 Other long term (current) drug therapy: Secondary | ICD-10-CM | POA: Insufficient documentation

## 2012-07-27 DIAGNOSIS — S0990XA Unspecified injury of head, initial encounter: Secondary | ICD-10-CM

## 2012-07-27 DIAGNOSIS — Z8679 Personal history of other diseases of the circulatory system: Secondary | ICD-10-CM | POA: Insufficient documentation

## 2012-07-27 DIAGNOSIS — I129 Hypertensive chronic kidney disease with stage 1 through stage 4 chronic kidney disease, or unspecified chronic kidney disease: Secondary | ICD-10-CM | POA: Insufficient documentation

## 2012-07-27 DIAGNOSIS — Z8673 Personal history of transient ischemic attack (TIA), and cerebral infarction without residual deficits: Secondary | ICD-10-CM | POA: Insufficient documentation

## 2012-07-27 DIAGNOSIS — N189 Chronic kidney disease, unspecified: Secondary | ICD-10-CM | POA: Insufficient documentation

## 2012-07-27 DIAGNOSIS — Z794 Long term (current) use of insulin: Secondary | ICD-10-CM | POA: Insufficient documentation

## 2012-07-27 DIAGNOSIS — Z95810 Presence of automatic (implantable) cardiac defibrillator: Secondary | ICD-10-CM | POA: Insufficient documentation

## 2012-07-27 DIAGNOSIS — Y92009 Unspecified place in unspecified non-institutional (private) residence as the place of occurrence of the external cause: Secondary | ICD-10-CM | POA: Insufficient documentation

## 2012-07-27 DIAGNOSIS — I5022 Chronic systolic (congestive) heart failure: Secondary | ICD-10-CM | POA: Insufficient documentation

## 2012-07-27 DIAGNOSIS — E1029 Type 1 diabetes mellitus with other diabetic kidney complication: Secondary | ICD-10-CM | POA: Insufficient documentation

## 2012-07-27 DIAGNOSIS — Y93E8 Activity, other personal hygiene: Secondary | ICD-10-CM | POA: Insufficient documentation

## 2012-07-27 DIAGNOSIS — Z8739 Personal history of other diseases of the musculoskeletal system and connective tissue: Secondary | ICD-10-CM | POA: Insufficient documentation

## 2012-07-27 DIAGNOSIS — Z7901 Long term (current) use of anticoagulants: Secondary | ICD-10-CM | POA: Insufficient documentation

## 2012-07-27 DIAGNOSIS — I2510 Atherosclerotic heart disease of native coronary artery without angina pectoris: Secondary | ICD-10-CM | POA: Insufficient documentation

## 2012-07-27 DIAGNOSIS — I4891 Unspecified atrial fibrillation: Secondary | ICD-10-CM | POA: Insufficient documentation

## 2012-07-27 DIAGNOSIS — Z87891 Personal history of nicotine dependence: Secondary | ICD-10-CM | POA: Insufficient documentation

## 2012-07-27 DIAGNOSIS — W208XXA Other cause of strike by thrown, projected or falling object, initial encounter: Secondary | ICD-10-CM | POA: Insufficient documentation

## 2012-07-27 MED ORDER — ACETAMINOPHEN 500 MG PO TABS
1000.0000 mg | ORAL_TABLET | Freq: Once | ORAL | Status: AC
  Administered 2012-07-27: 1000 mg via ORAL
  Filled 2012-07-27: qty 2

## 2012-07-27 MED ORDER — HYDROCODONE-ACETAMINOPHEN 5-325 MG PO TABS: 2.0000 | ORAL_TABLET | ORAL | Status: DC | PRN

## 2012-07-27 NOTE — ED Notes (Signed)
Pt transported to radiology.

## 2012-07-27 NOTE — ED Provider Notes (Signed)
History     CSN: [id number]  Arrival date & time 07/27/12  2000   First MD Initiated Contact with Patient 07/27/12 2016      Chief Complaint  Patient presents with  . Head Injury    (Consider location/radiation/quality/duration/timing/severity/associated sxs/prior treatment) HPI Comments: 67 year old female with a history of atrial fibrillation, on Coumadin who presents after sustaining a head injury while she was sitting on the commode. She states that the cabinet above her head fell down striking her in the head. She had no loss of consciousness but was forced forward in hyperflexion causing upper abdominal pain. She has a persistent headache which is moderate, nothing seems to make it better or worse and is not associated with loss of consciousness, neck pain, visual changes, weakness, numbness or neck pain. She has had no medications prior to arrival and has been able to ambulate without difficulty or ataxia. This occurred acute in onset just prior to arrival.  Patient is a 67 y.o. female presenting with head injury. The history is provided by the patient.  Head Injury   Past Medical History  Diagnosis Date  . Chronic systolic heart failure   . Diabetes mellitus     type 1  . Hypertension   . Stroke 2010    eye doctor said she had TIA  . Ischemic cardiomyopathy   . Ventricular tachycardia     Polymorphic  . 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   . Atrial fibrillation     controlled with amiodarone, on coumadin  . Coronary artery disease 01/31/2011  . Chronic renal insufficiency   . Lumbar spondylosis 01/11/2012    Past Surgical History  Procedure Laterality Date  . Abdominal hysterectomy  1980'S  . Cholecystectomy      1990'S  . Tubaligation  1980  . Icd  2003/2007    implanted by Dr Alanda Amass, most recent generator change 2/13 by Dr Johney Frame, Analyze ST study patient    Family History  Problem Relation Age of Onset  . Diabetes Mother   . Heart disease  Mother   . Heart disease Father     History  Substance Use Topics  . Smoking status: Former Smoker    Quit date: 08/16/1995  . Smokeless tobacco: Never Used  . Alcohol Use: No    OB History   Grav Para Term Preterm Abortions TAB SAB Ect Mult Living                  Review of Systems  All other systems reviewed and are negative.    Allergies  Darvon; Promethazine hcl; and Plavix  Home Medications   Current Outpatient Rx  Name  Route  Sig  Dispense  Refill  . amiodarone (PACERONE) 200 MG tablet   Oral   Take 1 tablet (200 mg total) by mouth daily.   60 tablet   6   . carvedilol (COREG) 25 MG tablet   Oral   Take 25 mg by mouth 2 (two) times daily with a meal.         . cetirizine (ZYRTEC) 10 MG tablet   Oral   Take 10 mg by mouth daily as needed for allergies.         . Cholecalciferol (VITAMIN D3) 2000 UNITS TABS   Oral   Take 1 tablet by mouth daily.           . furosemide (LASIX) 20 MG tablet   Oral   Take 20 mg by  mouth every other day.         . gabapentin (NEURONTIN) 400 MG capsule   Oral   Take 400 mg by mouth 2 (two) times daily.          . insulin detemir (LEVEMIR) 100 UNIT/ML injection   Subcutaneous   Inject 65 Units into the skin 2 (two) times daily.          . insulin glulisine (APIDRA) 100 UNIT/ML injection   Subcutaneous   Inject 0-10 Units into the skin 3 (three) times daily before meals. Patient is on a sliding scale.         [name] lisinopril (PRINIVIL,ZESTRIL) 5 MG tablet   Oral   Take 1 tablet (5 mg total) by mouth daily.         . nitroGLYCERIN (NITROSTAT) 0.4 MG SL tablet   Sublingual   Place 1 tablet (0.4 mg total) under the tongue every 5 (five) minutes as needed. For chest pain   25 tablet   1   . nizatidine (AXID) 150 MG capsule   Oral   Take 150 mg by mouth at bedtime.          [name] omeprazole (PRILOSEC) 20 MG capsule   Oral   Take 20 mg by mouth daily.         . rosuvastatin (CRESTOR) 20 MG  tablet   Oral   Take 20 mg by mouth every evening.         [name] spironolactone (ALDACTONE) 25 MG tablet   Oral   Take 0.5 tablets (12.5 mg total) by mouth daily.         . traMADol-acetaminophen (ULTRACET) 37.5-325 MG per tablet   Oral   Take 1 tablet by mouth every 8 (eight) hours as needed for pain.   30 tablet   3   . Vitamin D, Ergocalciferol, (DRISDOL) 50000 UNITS CAPS   Oral   Take 50,000 Units by mouth. 50,000 units on Monday and 2,000 every day         . warfarin (COUMADIN) 2.5 MG tablet   Oral   Take 1.25 mg by mouth daily.         [name] enoxaparin (LOVENOX) 100 MG/ML injection   Subcutaneous   Inject 1 mL (100 mg total) into the skin every 12 (twelve) hours.   10 Syringe   1     Take as directed by coumadin clinic   . HYDROcodone-acetaminophen (NORCO/VICODIN) 5-325 MG per tablet   Oral   Take 2 tablets by mouth every 4 (four) hours as needed for pain.   10 tablet   0     BP 110/63  Pulse 72  Temp(Src) 98.7 F (37.1 C) (Oral)  Resp 14  SpO2 98%  Physical Exam  Nursing note and vitals reviewed. Constitutional: She appears well-developed and well-nourished. No distress.  HENT:  Head: Normocephalic and atraumatic.  Mouth/Throat: Oropharynx is clear and moist. No oropharyngeal exudate.  No signs of bruising lacerations contusions or abrasions to the scalp. no facial tenderness, deformity, malocclusion or hemotympanum.  no battle's sign or racoon eyes.   Eyes: Conjunctivae and EOM are normal. Pupils are equal, round, and reactive to light. Right eye exhibits no discharge. Left eye exhibits no discharge. No scleral icterus.  Neck: Normal range of motion. Neck supple. No JVD present. No thyromegaly present.  Cardiovascular: Normal rate, regular rhythm, normal heart sounds and intact distal pulses.  Exam reveals no gallop and no friction rub.  No murmur heard. Pulmonary/Chest: Effort normal and breath sounds normal. No respiratory distress. She has no  wheezes. She has no rales.  Abdominal: Soft. Bowel sounds are normal. She exhibits no distension and no mass. There is tenderness ( Mild upper abdominal tenderness with reproducible tenderness to palpation, positive carnett's sign).  Musculoskeletal: Normal range of motion. She exhibits no edema and no tenderness.  No tenderness over the neck or the spines including thoracic and lumbar and cervical.  Lymphadenopathy:    She has no cervical adenopathy.  Neurological: She is alert. Coordination normal.  Skin: Skin is warm and dry. No rash noted. No erythema.  Psychiatric: She has a normal mood and affect. Her behavior is normal.    ED Course  Procedures (including critical care time)  Labs Reviewed  PROTIME-INR - Abnormal; Notable for the following:    Prothrombin Time 26.5 (*)    INR 2.59 (*)    All other components within normal limits   Ct Head Wo Contrast  07/27/2012  *RADIOLOGY REPORT*  Clinical Data: Larey Seat in bathroom today and hit head.  History of anticoagulation for atrial fibrillation, on Coumadin.  CT HEAD WITHOUT CONTRAST  Technique:  Contiguous axial images were obtained from the base of the skull through the vertex without contrast.  Comparison: There is a report from prior head CT 01/11/2002, but the images are not available.  Findings: No acute stroke or hemorrhage.  No mass lesion or hydrocephalus.  No extra-axial fluid.  Slight cerebellar atrophy.  Normal cerebral volume.  No significant white matter disease.  There is an old right MCA territory infarct affecting the anterior frontal lobe extending to the periventricular region.  Its focal nature suggests a distal MCA branch occlusion, likely related to atrial fibrillation. This was not described in 2003.  No other large vessel infarcts are identified.  The calvarium is intact.  Mild vascular calcification is seen. There is no significant sinus or mastoid disease.  The orbits appear unremarkable status post cataract extraction.   IMPRESSION: Evidence for remote right MCA territory infarct.  No acute intracranial abnormality is seen.  No evidence for skull fracture or intracranial hemorrhage.   Original Report Authenticated By: Davonna Belling, M.D.      1. Minor head injury, initial encounter       MDM  Evaluation for her brain injury, patient is anticoagulated thus putting her at risk for hemorrhage, neuro exam is normal, no spinal tenderness and otherwise normal at baseline.  CT scan negative, INR appropriate, stable for discharge.   Meds given in ED:  Medications  acetaminophen (TYLENOL) tablet 1,000 mg (1,000 mg Oral Given 07/27/12 2220)    New Prescriptions   HYDROCODONE-ACETAMINOPHEN (NORCO/VICODIN) 5-325 MG PER TABLET    Take 2 tablets by mouth every 4 (four) hours as needed for pain.          Vida Roller, MD 07/27/12 2231

## 2012-07-27 NOTE — ED Notes (Signed)
PT. REPORTS CABINET AT BATHROOM FELL ON HER HEAD THIS EVENING , NO LOC , AMBULATORY , STATES HEADACHE AND SLIGHT UPPER ABDOMINAL PAIN AFTER BENDING DOWN . SKIN INTACT.

## 2012-07-28 ENCOUNTER — Other Ambulatory Visit (INDEPENDENT_AMBULATORY_CARE_PROVIDER_SITE_OTHER): Payer: PRIVATE HEALTH INSURANCE

## 2012-07-28 ENCOUNTER — Other Ambulatory Visit: Payer: Self-pay | Admitting: *Deleted

## 2012-07-28 ENCOUNTER — Ambulatory Visit (HOSPITAL_COMMUNITY): Payer: PRIVATE HEALTH INSURANCE | Attending: Cardiovascular Disease | Admitting: Radiology

## 2012-07-28 DIAGNOSIS — R5381 Other malaise: Secondary | ICD-10-CM | POA: Insufficient documentation

## 2012-07-28 DIAGNOSIS — R609 Edema, unspecified: Secondary | ICD-10-CM

## 2012-07-28 DIAGNOSIS — I4891 Unspecified atrial fibrillation: Secondary | ICD-10-CM

## 2012-07-28 DIAGNOSIS — R0602 Shortness of breath: Secondary | ICD-10-CM

## 2012-07-28 DIAGNOSIS — I2589 Other forms of chronic ischemic heart disease: Secondary | ICD-10-CM

## 2012-07-28 DIAGNOSIS — R5383 Other fatigue: Secondary | ICD-10-CM

## 2012-07-28 LAB — BASIC METABOLIC PANEL
BUN: 72 mg/dL — ABNORMAL HIGH (ref 6–23)
CO2: 20 mEq/L (ref 19–32)
Calcium: 8.6 mg/dL (ref 8.4–10.5)
Glucose, Bld: 103 mg/dL — ABNORMAL HIGH (ref 70–99)
Potassium: 4.8 mEq/L (ref 3.5–5.1)
Sodium: 138 mEq/L (ref 135–145)

## 2012-07-28 NOTE — Progress Notes (Signed)
Echocardiogram performed. Patient experienced drop in blood sugar post test.  Patient was given a total of 4 orange juices and 3 packs of graham crackers and peanut butter.  Patient blood sugar was 60 after first set of crackers and juice.  Patient left stable.

## 2012-08-01 ENCOUNTER — Other Ambulatory Visit (INDEPENDENT_AMBULATORY_CARE_PROVIDER_SITE_OTHER): Payer: PRIVATE HEALTH INSURANCE

## 2012-08-01 ENCOUNTER — Ambulatory Visit (INDEPENDENT_AMBULATORY_CARE_PROVIDER_SITE_OTHER): Payer: PRIVATE HEALTH INSURANCE | Admitting: *Deleted

## 2012-08-01 DIAGNOSIS — I2589 Other forms of chronic ischemic heart disease: Secondary | ICD-10-CM

## 2012-08-01 DIAGNOSIS — Z95810 Presence of automatic (implantable) cardiac defibrillator: Secondary | ICD-10-CM

## 2012-08-01 DIAGNOSIS — I4891 Unspecified atrial fibrillation: Secondary | ICD-10-CM

## 2012-08-01 DIAGNOSIS — I255 Ischemic cardiomyopathy: Secondary | ICD-10-CM

## 2012-08-01 LAB — BASIC METABOLIC PANEL
BUN: 40 mg/dL — ABNORMAL HIGH (ref 6–23)
CO2: 22 mEq/L (ref 19–32)
Calcium: 8.5 mg/dL (ref 8.4–10.5)
Chloride: 112 mEq/L (ref 96–112)
Creatinine, Ser: 1.4 mg/dL — ABNORMAL HIGH (ref 0.4–1.2)

## 2012-08-03 LAB — REMOTE ICD DEVICE
AL IMPEDENCE ICD: 450 Ohm
BRDY-0002RA: 60 {beats}/min
BRDY-0003RA: 120 {beats}/min
BRDY-0004RA: 120 {beats}/min
RV LEAD IMPEDENCE ICD: 490 Ohm
TZAT-0004SLOWVT: 8
TZAT-0018SLOWVT: NEGATIVE
TZAT-0019SLOWVT: 7.5 V
TZON-0003SLOWVT: 340 ms
TZON-0004SLOWVT: 30
TZON-0005SLOWVT: 6
TZON-0010SLOWVT: 40 ms
TZST-0001SLOWVT: 3

## 2012-08-04 ENCOUNTER — Emergency Department (HOSPITAL_COMMUNITY)
Admission: EM | Admit: 2012-08-04 | Discharge: 2012-08-04 | Disposition: A | Discharge status: Home / Self Care | Payer: PRIVATE HEALTH INSURANCE | Attending: Emergency Medicine | Admitting: Emergency Medicine

## 2012-08-04 ENCOUNTER — Encounter (HOSPITAL_COMMUNITY): Payer: Self-pay | Admitting: Cardiology

## 2012-08-04 DIAGNOSIS — Z95810 Presence of automatic (implantable) cardiac defibrillator: Secondary | ICD-10-CM | POA: Insufficient documentation

## 2012-08-04 DIAGNOSIS — Z7901 Long term (current) use of anticoagulants: Secondary | ICD-10-CM | POA: Insufficient documentation

## 2012-08-04 DIAGNOSIS — Z87891 Personal history of nicotine dependence: Secondary | ICD-10-CM | POA: Insufficient documentation

## 2012-08-04 DIAGNOSIS — I129 Hypertensive chronic kidney disease with stage 1 through stage 4 chronic kidney disease, or unspecified chronic kidney disease: Secondary | ICD-10-CM | POA: Insufficient documentation

## 2012-08-04 DIAGNOSIS — Z8673 Personal history of transient ischemic attack (TIA), and cerebral infarction without residual deficits: Secondary | ICD-10-CM | POA: Insufficient documentation

## 2012-08-04 DIAGNOSIS — Z79899 Other long term (current) drug therapy: Secondary | ICD-10-CM | POA: Insufficient documentation

## 2012-08-04 DIAGNOSIS — I5022 Chronic systolic (congestive) heart failure: Secondary | ICD-10-CM | POA: Insufficient documentation

## 2012-08-04 DIAGNOSIS — E1029 Type 1 diabetes mellitus with other diabetic kidney complication: Secondary | ICD-10-CM | POA: Insufficient documentation

## 2012-08-04 DIAGNOSIS — Z794 Long term (current) use of insulin: Secondary | ICD-10-CM | POA: Insufficient documentation

## 2012-08-04 DIAGNOSIS — G8921 Chronic pain due to trauma: Secondary | ICD-10-CM | POA: Insufficient documentation

## 2012-08-04 DIAGNOSIS — I2510 Atherosclerotic heart disease of native coronary artery without angina pectoris: Secondary | ICD-10-CM | POA: Insufficient documentation

## 2012-08-04 DIAGNOSIS — N189 Chronic kidney disease, unspecified: Secondary | ICD-10-CM | POA: Insufficient documentation

## 2012-08-04 DIAGNOSIS — M549 Dorsalgia, unspecified: Secondary | ICD-10-CM

## 2012-08-04 DIAGNOSIS — Z8679 Personal history of other diseases of the circulatory system: Secondary | ICD-10-CM | POA: Insufficient documentation

## 2012-08-04 DIAGNOSIS — Z8739 Personal history of other diseases of the musculoskeletal system and connective tissue: Secondary | ICD-10-CM | POA: Insufficient documentation

## 2012-08-04 MED ORDER — METHOCARBAMOL 500 MG PO TABS: 500.0000 mg | ORAL_TABLET | Freq: Two times a day (BID) | ORAL | Status: DC

## 2012-08-04 NOTE — ED Notes (Signed)
Pt c/o pain right sub-scapular area which radiates to right shoulder and around right ribs and epigastric area. Denies SOB or N/V. States bowels are functioning normally. Appetite is good.

## 2012-08-04 NOTE — ED Provider Notes (Signed)
History     CSN: [id number]  Arrival date & time 08/04/12  1044   First MD Initiated Contact with Patient 08/04/12 1057      Chief Complaint  Patient presents with  . Back Pain    (Consider location/radiation/quality/duration/timing/severity/associated sxs/prior treatment) HPI Comments: Patient presenting with right upper back pain that has been present for the past week after a cabinet fell on her.  She was seen and evaluated in the ED after the incident.  She reports that initially she had a headache, but the next day developed back pain.  She does not have a headache at this time.  Pain worse with movement.  She has been taking Norco for the pain, which does help.    Patient is a 67 y.o. female presenting with back pain. The history is provided by the patient.  Back Pain Quality:  Aching (tenderness) Radiates to:  Does not radiate Onset quality:  Gradual Worsened by:  Bending and movement Associated symptoms: no abdominal pain, no bladder incontinence, no bowel incontinence, no fever, no numbness, no paresthesias, no tingling and no weakness     Past Medical History  Diagnosis Date  . Chronic systolic heart failure   . Diabetes mellitus     type 1  . Hypertension   . Stroke 2010    eye doctor said she had TIA  . Ischemic cardiomyopathy   . Ventricular tachycardia     Polymorphic  . 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   . Atrial fibrillation     controlled with amiodarone, on coumadin  . Coronary artery disease 01/31/2011  . Chronic renal insufficiency   . Lumbar spondylosis 01/11/2012    Past Surgical History  Procedure Laterality Date  . Abdominal hysterectomy  1980'S  . Cholecystectomy      1990'S  . Tubaligation  1980  . Icd  2003/2007    implanted by Dr Alanda Amass, most recent generator change 2/13 by Dr Johney Frame, Analyze ST study patient    Family History  Problem Relation Age of Onset  . Diabetes Mother   . Heart disease Mother   . Heart  disease Father     History  Substance Use Topics  . Smoking status: Former Smoker    Quit date: 08/16/1995  . Smokeless tobacco: Never Used  . Alcohol Use: No    OB History   Grav Para Term Preterm Abortions TAB SAB Ect Mult Living                  Review of Systems  Constitutional: Negative for fever and chills.  Gastrointestinal: Negative for abdominal pain and bowel incontinence.  Genitourinary: Negative for bladder incontinence.  Musculoskeletal: Positive for back pain.  Neurological: Negative for tingling, weakness, numbness and paresthesias.    Allergies  Darvon; Promethazine hcl; and Plavix  Home Medications   Current Outpatient Rx  Name  Route  Sig  Dispense  Refill  . amiodarone (PACERONE) 200 MG tablet   Oral   Take 1 tablet (200 mg total) by mouth daily.   60 tablet   6   . carvedilol (COREG) 25 MG tablet   Oral   Take 25 mg by mouth 2 (two) times daily with a meal.         . cetirizine (ZYRTEC) 10 MG tablet   Oral   Take 10 mg by mouth daily as needed for allergies.         . Cholecalciferol (VITAMIN D3) 2000 UNITS TABS  Oral   Take 2,000 Units by mouth daily.          [name] gabapentin (NEURONTIN) 400 MG capsule   Oral   Take 400 mg by mouth 2 (two) times daily.          [name] HYDROcodone-acetaminophen (NORCO/VICODIN) 5-325 MG per tablet   Oral   Take 2 tablets by mouth every 4 (four) hours as needed for pain.   10 tablet   0   . insulin detemir (LEVEMIR) 100 UNIT/ML injection   Subcutaneous   Inject 65 Units into the skin 2 (two) times daily.          . insulin glulisine (APIDRA) 100 UNIT/ML injection   Subcutaneous   Inject 0-10 Units into the skin 3 (three) times daily before meals. Patient is on a sliding scale.         [name] lisinopril (PRINIVIL,ZESTRIL) 5 MG tablet   Oral   Take 1 tablet (5 mg total) by mouth daily.         . nitroGLYCERIN (NITROSTAT) 0.4 MG SL tablet   Sublingual   Place 1 tablet (0.4 mg total) under the  tongue every 5 (five) minutes as needed. For chest pain   25 tablet   1   . nizatidine (AXID) 150 MG capsule   Oral   Take 150 mg by mouth at bedtime.          [name] omeprazole (PRILOSEC) 20 MG capsule   Oral   Take 20 mg by mouth daily.         . rosuvastatin (CRESTOR) 20 MG tablet   Oral   Take 20 mg by mouth every evening.         . traMADol-acetaminophen (ULTRACET) 37.5-325 MG per tablet   Oral   Take 1 tablet by mouth every 8 (eight) hours as needed for pain.   30 tablet   3   . Vitamin D, Ergocalciferol, (DRISDOL) 50000 UNITS CAPS   Oral   Take 50,000 Units by mouth every 7 (seven) days. on Monday         . warfarin (COUMADIN) 2.5 MG tablet   Oral   Take 1.25 mg by mouth daily.           BP 104/84  Pulse 64  Temp(Src) 98.2 F (36.8 C) (Oral)  Resp 14  SpO2 98%  Physical Exam  Nursing note and vitals reviewed. Constitutional: She appears well-developed and well-nourished.  HENT:  Head: Normocephalic and atraumatic.  Neck: Normal range of motion. Neck supple.  Cardiovascular: Normal rate, regular rhythm and normal heart sounds.   Pulmonary/Chest: Effort normal and breath sounds normal.  Abdominal: Soft. There is no tenderness.  Musculoskeletal: Normal range of motion.       Cervical back: She exhibits normal range of motion, no tenderness, no bony tenderness, no swelling, no edema and no deformity.       Thoracic back: She exhibits normal range of motion, no tenderness, no bony tenderness, no swelling, no edema and no deformity.       Lumbar back: She exhibits normal range of motion, no tenderness, no bony tenderness, no swelling, no edema and no deformity.       Arms: Neurological: She is alert. She has normal strength. No sensory deficit.  Skin: Skin is warm, dry and intact. No abrasion, no bruising and no ecchymosis noted.  Psychiatric: She has a normal mood and affect.    ED Course  Procedures (including critical care  time)  Labs Reviewed -  No data to display No results found.   No diagnosis found.    MDM  Patient presenting with upper back pain that has been present since a cabinet fell on her one week ago.  No spinal tenderness on exam.  No obvious signs of trauma.  No red flags of back pain.  Feel that patient is stable for discharge.        Pascal Lux [place], PA-C 08/06/12 [phone]

## 2012-08-04 NOTE — ED Notes (Signed)
Pt reports she had a cabinet fall on her about a week ago and was seen here. Reports that she had CT done and cleared afterwards. States that she is having pain still back in her right shoulder blade and felt a knot on her back. Pt with ROM.

## 2012-08-04 NOTE — ED Notes (Signed)
Sign pad not working

## 2012-08-09 NOTE — ED Provider Notes (Signed)
Medical screening examination/treatment/procedure(s) were performed by non-physician practitioner and as supervising physician I was immediately available for consultation/collaboration.   Pavan Bring III, MD 08/09/12 1426 

## 2012-08-10 ENCOUNTER — Encounter: Payer: Self-pay | Admitting: *Deleted

## 2012-08-11 ENCOUNTER — Encounter
Payer: PRIVATE HEALTH INSURANCE | Attending: Physical Medicine & Rehabilitation | Admitting: Physical Medicine and Rehabilitation

## 2012-08-11 ENCOUNTER — Ambulatory Visit: Payer: PRIVATE HEALTH INSURANCE | Admitting: Physical Medicine & Rehabilitation

## 2012-08-11 ENCOUNTER — Other Ambulatory Visit: Payer: Self-pay | Admitting: Physical Medicine and Rehabilitation

## 2012-08-11 ENCOUNTER — Ambulatory Visit (HOSPITAL_COMMUNITY)
Admission: RE | Admit: 2012-08-11 | Discharge: 2012-08-11 | Disposition: A | Discharge status: Home / Self Care | Payer: PRIVATE HEALTH INSURANCE | Source: Ambulatory Visit | Attending: Physical Medicine and Rehabilitation | Admitting: Physical Medicine and Rehabilitation

## 2012-08-11 ENCOUNTER — Encounter: Payer: Self-pay | Admitting: Physical Medicine and Rehabilitation

## 2012-08-11 ENCOUNTER — Encounter: Payer: Self-pay | Admitting: Physical Medicine & Rehabilitation

## 2012-08-11 VITALS — BP 125/61 | HR 73 | Resp 14 | Ht 63.0 in | Wt 211.0 lb

## 2012-08-11 DIAGNOSIS — M546 Pain in thoracic spine: Secondary | ICD-10-CM

## 2012-08-11 DIAGNOSIS — I2510 Atherosclerotic heart disease of native coronary artery without angina pectoris: Secondary | ICD-10-CM | POA: Insufficient documentation

## 2012-08-11 DIAGNOSIS — M5380 Other specified dorsopathies, site unspecified: Secondary | ICD-10-CM | POA: Insufficient documentation

## 2012-08-11 DIAGNOSIS — M479 Spondylosis, unspecified: Secondary | ICD-10-CM | POA: Insufficient documentation

## 2012-08-11 DIAGNOSIS — G9519 Other vascular myelopathies: Secondary | ICD-10-CM | POA: Insufficient documentation

## 2012-08-11 DIAGNOSIS — M47817 Spondylosis without myelopathy or radiculopathy, lumbosacral region: Secondary | ICD-10-CM | POA: Insufficient documentation

## 2012-08-11 DIAGNOSIS — S22080A Wedge compression fracture of T11-T12 vertebra, initial encounter for closed fracture: Secondary | ICD-10-CM

## 2012-08-11 DIAGNOSIS — M439 Deforming dorsopathy, unspecified: Secondary | ICD-10-CM | POA: Insufficient documentation

## 2012-08-11 DIAGNOSIS — Z95810 Presence of automatic (implantable) cardiac defibrillator: Secondary | ICD-10-CM | POA: Insufficient documentation

## 2012-08-11 MED ORDER — DICLOFENAC SODIUM 1 % TD GEL: 2.0000 g | Freq: Four times a day (QID) | TRANSDERMAL | Status: DC

## 2012-08-11 MED ORDER — METHOCARBAMOL 500 MG PO TABS: 500.0000 mg | ORAL_TABLET | Freq: Two times a day (BID) | ORAL | Status: DC

## 2012-08-11 NOTE — Progress Notes (Unsigned)
    Injection not done to no PT/INR results.  Advised patient PT/INR needs checked 24 hours or less before injection

## 2012-08-11 NOTE — Progress Notes (Addendum)
Subjective:    Patient ID: [name], female    DOB: 1945/09/12, 67 y.o.   MRN: [id number]  HPI The patient is a 67 year old female female, who presents with pain right below her medial inferior angle of her right shoulder blade . The symptoms started ago 2 weeks ago, after a kabinet fell on her back, when she was sitting on a comode. The patient complains about moderate to severe pain. Patient denies any radiating symptoms. She describes the pain as constant aching. Applying heat, taking medications , changing positions alleviate the symptoms. Turning aggrevates the symptoms. The patient grades his pain as a  8/10. Pain Inventory Average Pain 10 Pain Right Now 8 My pain is aching  In the last 24 hours, has pain interfered with the following? General activity 7 Relation with others 2 Enjoyment of life 6 What TIME of day is your pain at its worst? constant Sleep (in general) Fair  Pain is worse with: walking, bending, standing and some activites Pain improves with: medication Relief from Meds: 4  Mobility walk without assistance how many minutes can you walk? 15 ability to climb steps?  no do you drive?  yes  Function disabled: date disabled 01/07 I need assistance with the following:  household duties  Neuro/Psych No problems in this area  Prior Studies Any changes since last visit?  no  Physicians involved in your care Any changes since last visit?  no   Family History  Problem Relation Age of Onset  . Diabetes Mother   . Heart disease Mother   . Heart disease Father    History   Social History  . Marital Status: Divorced    Spouse Name: N/A    Number of Children: N/A  . Years of Education: N/A   Social History Main Topics  . Smoking status: Former Smoker    Quit date: 08/16/1995  . Smokeless tobacco: Never Used  . Alcohol Use: No  . Drug Use: No  . Sexually Active: None   Other Topics Concern  . None   Social History Narrative  . None    Past Surgical History  Procedure Laterality Date  . Abdominal hysterectomy  1980'S  . Cholecystectomy      1990'S  . Tubaligation  1980  . Icd  2003/2007    implanted by Dr Alanda Amass, most recent generator change 2/13 by Dr Johney Frame, Analyze ST study patient   Past Medical History  Diagnosis Date  . Chronic systolic heart failure   . Diabetes mellitus     type 1  . Hypertension   . Stroke 2010    eye doctor said she had TIA  . Ischemic cardiomyopathy   . Ventricular tachycardia     Polymorphic  . 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   . Atrial fibrillation     controlled with amiodarone, on coumadin  . Coronary artery disease 01/31/2011  . Chronic renal insufficiency   . Lumbar spondylosis 01/11/2012   BP 125/61  Pulse 73  Resp 14  Ht 5\' 3"  (1.6 m)  Wt 211 lb (95.709 kg)  BMI 37.39 kg/m2  SpO2 95%     Review of Systems  Musculoskeletal: Positive for myalgias, back pain and arthralgias.  All other systems reviewed and are negative.       Objective:   Physical Exam  Constitutional: She is oriented to person, place, and time. She appears well-developed and well-nourished.  HENT:  Head: Normocephalic.  Neck: Neck supple.  Musculoskeletal: She exhibits tenderness.  Neurological: She is alert and oriented to person, place, and time.  Skin: Skin is warm and dry.  Psychiatric: She has a normal mood and affect.    Symmetric normal motor tone is noted throughout. Normal muscle bulk. Muscle testing reveals 5/5 muscle strength of the upper extremity, and 5/5 of the lower extremity. Full range of motion in upper and lower extremities. ROM of thoracic spine is severely restricted into rotation to the right. Fine motor movements are normal in both hands. Sensory is intact and symmetric to light touch, pinprick and proprioception. DTR in the upper and lower extremity are present and symmetric 2+. No clonus is noted.  Patient arises from chair with mild  difficulty. Wide based gait with normal arm swing bilateral.       Assessment & Plan:  This is a 67 year old female with 1.Right sided thoracic pain, muscular versus rib fracture, prescribed Voltaren gel, ordered X-ray of thoracic, consider PT after x-ray result, continue with the tramadol and robaxin  2.Lumbar spondylosis 3.CAD Follow up in 1 month  X-ray did show compression fracture, ordered CT, patient can not have a MRI because she has a Health visitor.  CT-scan showed : Mild compression deformities at T 3,4 and T5, this is new from prior CT, therefore likely subacute or acute. Posterior elements are intact. Mild associated paraspinal hemorrhage. Stable mild degenerative changes. No significant spinal stenosis. Referral to a neurosurgeon, for further evaluation.

## 2012-08-11 NOTE — Patient Instructions (Signed)
Apply ice or heat to your back as tolerated

## 2012-08-11 NOTE — Progress Notes (Unsigned)
Resume Coumadin today. Reschedule injection Stop  Coumadin 5 days prior to injection Check PT INR within 24 hours of injection and send it to my office

## 2012-08-12 ENCOUNTER — Telehealth: Payer: Self-pay

## 2012-08-12 NOTE — Telephone Encounter (Signed)
Patient informed of xray results.  Clerical staff will contact insurance to get CT approved.  Patient aware.

## 2012-08-12 NOTE — Telephone Encounter (Signed)
Message copied by Judd Gaudier on Fri Aug 12, 2012  1:48 PM ------      Message from: Su Monks      Created: Fri Aug 12, 2012  1:40 PM       Please call patient , her x-ray showed a compression deformity at T6 which could be a compression fracture, I will order a CT scan to further evaluate, we can not do a MRI because she has a defibrallator. Depending on results we will refer her to a spine specialist ------

## 2012-08-12 NOTE — Addendum Note (Signed)
Addended by: Su Monks on: 08/12/2012 01:46 PM   Modules accepted: Orders

## 2012-08-16 ENCOUNTER — Ambulatory Visit (INDEPENDENT_AMBULATORY_CARE_PROVIDER_SITE_OTHER): Payer: PRIVATE HEALTH INSURANCE | Admitting: *Deleted

## 2012-08-16 ENCOUNTER — Encounter: Payer: Self-pay | Admitting: Cardiology

## 2012-08-16 DIAGNOSIS — Z7901 Long term (current) use of anticoagulants: Secondary | ICD-10-CM

## 2012-08-16 DIAGNOSIS — I4891 Unspecified atrial fibrillation: Secondary | ICD-10-CM

## 2012-08-16 LAB — POCT INR: INR: 2.7

## 2012-08-16 NOTE — Patient Instructions (Addendum)
08/31/2012  Last day to  take coumadin 09/01/2012 no coumadin no lovenox 09/02/2012 Lovenox 100mg  9am and lovenox 100mg  9pm 09/03/2012 Lovenox 100mg  9am and lovenox 100mg  9pm 09/04/2012 Lovenox 100mg  9am and lovenox 100mg  9pm 09/05/2012 Lovenox 100mg  9am Appt in coumadin clinic for INR on 09/05/2012 09/06/2012 no coumadin no Lovenox day of procedure and when instructed by  MD doing procedure to restart coumadin take extra 1/2 tablet for 2 days and continue lovenox at same dose as well and continue Lovenox and coumadin until seen in clinic 1 week after procedure 09/09/2012

## 2012-08-18 ENCOUNTER — Ambulatory Visit (HOSPITAL_COMMUNITY)
Admission: RE | Admit: 2012-08-18 | Discharge: 2012-08-18 | Disposition: A | Discharge status: Home / Self Care | Payer: PRIVATE HEALTH INSURANCE | Source: Ambulatory Visit | Attending: Physical Medicine and Rehabilitation | Admitting: Physical Medicine and Rehabilitation

## 2012-08-18 DIAGNOSIS — Z9181 History of falling: Secondary | ICD-10-CM | POA: Insufficient documentation

## 2012-08-18 DIAGNOSIS — M5124 Other intervertebral disc displacement, thoracic region: Secondary | ICD-10-CM | POA: Insufficient documentation

## 2012-08-18 DIAGNOSIS — M439 Deforming dorsopathy, unspecified: Secondary | ICD-10-CM | POA: Insufficient documentation

## 2012-08-18 DIAGNOSIS — Z950 Presence of cardiac pacemaker: Secondary | ICD-10-CM | POA: Insufficient documentation

## 2012-08-18 DIAGNOSIS — E049 Nontoxic goiter, unspecified: Secondary | ICD-10-CM | POA: Insufficient documentation

## 2012-08-18 DIAGNOSIS — M546 Pain in thoracic spine: Secondary | ICD-10-CM

## 2012-08-18 DIAGNOSIS — K838 Other specified diseases of biliary tract: Secondary | ICD-10-CM | POA: Insufficient documentation

## 2012-08-18 DIAGNOSIS — Z9089 Acquired absence of other organs: Secondary | ICD-10-CM | POA: Insufficient documentation

## 2012-08-22 NOTE — Addendum Note (Signed)
Addended by: Su Monks on: 08/22/2012 12:13 PM   Modules accepted: Orders

## 2012-08-23 ENCOUNTER — Other Ambulatory Visit: Payer: Self-pay | Admitting: Cardiology

## 2012-08-24 ENCOUNTER — Telehealth: Payer: Self-pay | Admitting: Physical Medicine and Rehabilitation

## 2012-08-24 ENCOUNTER — Encounter: Payer: Self-pay | Admitting: Internal Medicine

## 2012-08-24 NOTE — Telephone Encounter (Signed)
ok 

## 2012-08-30 ENCOUNTER — Ambulatory Visit (INDEPENDENT_AMBULATORY_CARE_PROVIDER_SITE_OTHER): Payer: PRIVATE HEALTH INSURANCE | Admitting: Internal Medicine

## 2012-08-30 ENCOUNTER — Encounter: Payer: Self-pay | Admitting: Internal Medicine

## 2012-08-30 ENCOUNTER — Ambulatory Visit (INDEPENDENT_AMBULATORY_CARE_PROVIDER_SITE_OTHER)
Admission: RE | Admit: 2012-08-30 | Discharge: 2012-08-30 | Disposition: A | Discharge status: Home / Self Care | Payer: PRIVATE HEALTH INSURANCE | Source: Ambulatory Visit | Attending: Internal Medicine | Admitting: Internal Medicine

## 2012-08-30 VITALS — BP 142/72 | HR 77 | Temp 97.9°F | Ht 63.0 in | Wt 206.0 lb

## 2012-08-30 DIAGNOSIS — M25562 Pain in left knee: Secondary | ICD-10-CM

## 2012-08-30 DIAGNOSIS — I4891 Unspecified atrial fibrillation: Secondary | ICD-10-CM

## 2012-08-30 DIAGNOSIS — I2510 Atherosclerotic heart disease of native coronary artery without angina pectoris: Secondary | ICD-10-CM

## 2012-08-30 DIAGNOSIS — M25569 Pain in unspecified knee: Secondary | ICD-10-CM

## 2012-08-30 DIAGNOSIS — E119 Type 2 diabetes mellitus without complications: Secondary | ICD-10-CM

## 2012-08-30 DIAGNOSIS — Z23 Encounter for immunization: Secondary | ICD-10-CM

## 2012-08-30 HISTORY — DX: Pain in left knee: M25.562

## 2012-08-30 MED ORDER — INSULIN GLULISINE 100 UNIT/ML IJ SOLN: 18.0000 [IU] | Freq: Three times a day (TID) | INTRAMUSCULAR | Status: DC

## 2012-08-30 NOTE — Assessment & Plan Note (Signed)
Continue medications Continiue to follow with cardiology

## 2012-08-30 NOTE — Assessment & Plan Note (Signed)
Uncontrolled Will increase apidra to 18 units with meals Continue Levimer, will liklely titrate in 1 week Encouraged pt to visit eye doctor and podiatrist yearly Continue to work on diet and exercise She has never been to diabetes education and declines referral today

## 2012-08-30 NOTE — Progress Notes (Signed)
HPI  Pt presents to the clinic today to establish care. She is transferring care from Dr. Loleta Chance. She does have multiple chronic conditions. She does follow with cardiology for CHF, AFIB, previous stoke, CAD. She sees pain management for chronic pain in the back and knees. She is primarily her for management of her diabetes. Her sugars run from 45-300. Her last A1C in April was 9.6. She is on Apidra and Levemir. She reports non compliance with diet. She also has concerns today about left knee pain. She describes the pain as 9/10, sharp pain constant for the past 2 months. She denies injury to the area. She has had cortisone injections which have not helped. She has not had a xray of her left knee. The tramadol is not helping.  Flu: 2014 Tetanus: 2008 Colonoscopy: 2009 LMP: hysterectomy Pap smear: hysterectomy Mammogram: unable secondary to pacemaker  Past Medical History  Diagnosis Date  . Chronic systolic heart failure   . Diabetes mellitus     type 1  . Hypertension   . Stroke 2010    eye doctor said she had TIA  . Ischemic cardiomyopathy   . Ventricular tachycardia     Polymorphic  . 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   . Atrial fibrillation     controlled with amiodarone, on coumadin  . Coronary artery disease 01/31/2011  . Chronic renal insufficiency   . Lumbar spondylosis 01/11/2012  . History of chicken pox   . Allergy   . Hyperlipidemia     Current Outpatient Prescriptions  Medication Sig Dispense Refill  . amiodarone (PACERONE) 200 MG tablet Take 1 tablet (200 mg total) by mouth daily.  60 tablet  6  . carvedilol (COREG) 25 MG tablet Take 25 mg by mouth 2 (two) times daily with a meal.      . Cholecalciferol (VITAMIN D3) 2000 UNITS TABS Take 2,000 Units by mouth daily.       . CRESTOR 20 MG tablet TAKE 1 TABLET BY MOUTH EVERY DAY  30 tablet  1  . diclofenac sodium (VOLTAREN) 1 % GEL Apply 2 g topically 4 (four) times daily.  2 Tube  2  . gabapentin  (NEURONTIN) 400 MG capsule Take 400 mg by mouth 2 (two) times daily.       . insulin detemir (LEVEMIR) 100 UNIT/ML injection Inject 65 Units into the skin 2 (two) times daily.       . insulin glulisine (APIDRA) 100 UNIT/ML injection Inject 0-10 Units into the skin 3 (three) times daily before meals. Patient is on a sliding scale.      [name] lisinopril (PRINIVIL,ZESTRIL) 5 MG tablet Take 1 tablet (5 mg total) by mouth daily.      . nitroGLYCERIN (NITROSTAT) 0.4 MG SL tablet Place 1 tablet (0.4 mg total) under the tongue every 5 (five) minutes as needed. For chest pain  25 tablet  1  . nizatidine (AXID) 150 MG capsule Take 150 mg by mouth at bedtime.       [name] omeprazole (PRILOSEC) 20 MG capsule Take 20 mg by mouth daily.      . rosuvastatin (CRESTOR) 20 MG tablet Take 20 mg by mouth every evening.      . traMADol-acetaminophen (ULTRACET) 37.5-325 MG per tablet Take 1 tablet by mouth every 8 (eight) hours as needed for pain.  30 tablet  3  . Vitamin D, Ergocalciferol, (DRISDOL) 50000 UNITS CAPS Take 50,000 Units by mouth every 7 (seven) days. on Monday      .  warfarin (COUMADIN) 2.5 MG tablet Take 1.25 mg by mouth daily.      . cetirizine (ZYRTEC) 10 MG tablet Take 10 mg by mouth daily as needed for allergies.       No current facility-administered medications for this visit.    Allergies  Allergen Reactions  . Darvon Other (See Comments)    indigestion  . Promethazine Hcl Other (See Comments)    hyperactivity  . Plavix (Clopidogrel Bisulfate) Rash    Family History  Problem Relation Age of Onset  . Diabetes Mother   . Heart disease Mother   . Hyperlipidemia Mother   . Hypertension Mother   . Heart disease Father   . Heart attack Father   . Hypertension Father   . Early death Brother 25    History   Social History  . Marital Status: Divorced    Spouse Name: N/A    Number of Children: 3  . Years of Education: 12   Occupational History  . Retried    Social History Main Topics   . Smoking status: Former Smoker    Quit date: 08/16/1995  . Smokeless tobacco: Never Used  . Alcohol Use: No  . Drug Use: No  . Sexually Active: Not on file   Other Topics Concern  . Not on file   Social History Narrative   Regular exercise-no   Caffeine Use-no    ROS:  Constitutional: Denies fever, malaise, fatigue, headache or abrupt weight changes.  HEENT: Pt reports ringing in the ears. Denies eye pain, eye redness, ear pain, wax buildup, runny nose, nasal congestion, bloody nose, or sore throat. Respiratory: Denies difficulty breathing, shortness of breath, cough or sputum production.   Cardiovascular: Denies chest pain, chest tightness, palpitations or swelling in the hands or feet.  Gastrointestinal: Denies abdominal pain, bloating, constipation, diarrhea or blood in the stool.  GU: Denies frequency, urgency, pain with urination, blood in urine, odor or discharge. Musculoskeletal: Pt reports left knee pain. Denies decrease in range of motion, difficulty with gait, muscle pain.  Skin: Denies redness, rashes, lesions or ulcercations.  Neurological: Denies dizziness, difficulty with memory, difficulty with speech or problems with balance and coordination.   No other specific complaints in a complete review of systems (except as listed in HPI above).  PE:  BP 142/72  Pulse 77  Temp(Src) 97.9 F (36.6 C) (Oral)  Ht 5\' 3"  (1.6 m)  Wt 206 lb (93.441 kg)  BMI 36.5 kg/m2  SpO2 94% Wt Readings from Last 3 Encounters:  08/30/12 206 lb (93.441 kg)  08/11/12 211 lb (95.709 kg)  08/11/12 211 lb (95.709 kg)    General: Appears her  stated age, obese but well developed, well nourished in NAD. HEENT: Head: normal shape and size; Eyes: sclera white, no icterus, conjunctiva pink, PERRLA and EOMs intact; Ears: Tm's gray and intact, normal light reflex; Nose: mucosa pink and moist, septum midline; Throat/Mouth: Teeth present, mucosa pink and moist, no lesions or ulcerations noted.   Neck: Normal range of motion. Neck supple, trachea midline. No massses, lumps or thyromegaly present.  Cardiovascular: Normal rate and rhythm. S1,S2 noted.  No murmur, rubs or gallops noted. No JVD or BLE edema. No carotid bruits noted.pacemaker noted in left upper chest. Pulmonary/Chest: Normal effort and positive vesicular breath sounds. No respiratory distress. No wheezes, rales or ronchi noted.  Abdomen: Soft and nontender. Normal bowel sounds, no bruits noted. No distention or masses noted. Liver, spleen and kidneys non palpable. Musculoskeletal: Normal range  of motion. No signs of joint swelling. No difficulty with gait. Tenderness to palpation on the left medial portion of the knee.  Neurological: Alert and oriented. Cranial nerves II-XII intact. Coordination normal. +DTRs bilaterally. Psychiatric: Mood and affect normal. Behavior is normal. Judgment and thought content normal.   EKG:  BMET    Component Value Date/Time   NA 141 08/01/2012 0852   K 4.5 08/01/2012 0852   CL 112 08/01/2012 0852   CO2 22 08/01/2012 0852   GLUCOSE 162* 08/01/2012 0852   BUN 40* 08/01/2012 0852   CREATININE 1.4* 08/01/2012 0852   CALCIUM 8.5 08/01/2012 0852   GFRNONAA 54* 02/01/2011 0908   GFRAA 63* 02/01/2011 0908    Lipid Panel     Component Value Date/Time   CHOL 143 07/18/2012 1030   TRIG 70.0 07/18/2012 1030   HDL 57.70 07/18/2012 1030   CHOLHDL 2 07/18/2012 1030   VLDL 14.0 07/18/2012 1030   LDLCALC 71 07/18/2012 1030    CBC    Component Value Date/Time   WBC 5.8 09/02/2011 1220   RBC 4.15 09/02/2011 1220   HGB 12.1 09/02/2011 1220   HCT 36.7 09/02/2011 1220   PLT 224.0 09/02/2011 1220   MCV 88.3 09/02/2011 1220   MCH 28.1 01/30/2011 1543   MCHC 33.1 09/02/2011 1220   RDW 15.1* 09/02/2011 1220   LYMPHSABS 1.4 09/02/2011 1220   MONOABS 0.4 09/02/2011 1220   EOSABS 0.0 09/02/2011 1220   BASOSABS 0.0 09/02/2011 1220    Hgb A1C Lab Results  Component Value Date   HGBA1C 9.6* 07/18/2012      Assessment and Plan:

## 2012-08-30 NOTE — Assessment & Plan Note (Signed)
Xray negative Will refer to ortho

## 2012-08-30 NOTE — Assessment & Plan Note (Signed)
Continue current meds Continue to follow with cardiology Labs reviewed

## 2012-09-02 ENCOUNTER — Encounter (HOSPITAL_COMMUNITY): Payer: Self-pay | Admitting: Emergency Medicine

## 2012-09-02 ENCOUNTER — Emergency Department (HOSPITAL_COMMUNITY)
Admission: EM | Admit: 2012-09-02 | Discharge: 2012-09-03 | Disposition: A | Discharge status: Home / Self Care | Payer: PRIVATE HEALTH INSURANCE | Attending: Emergency Medicine | Admitting: Emergency Medicine

## 2012-09-02 DIAGNOSIS — Z95810 Presence of automatic (implantable) cardiac defibrillator: Secondary | ICD-10-CM | POA: Insufficient documentation

## 2012-09-02 DIAGNOSIS — M5430 Sciatica, unspecified side: Secondary | ICD-10-CM | POA: Insufficient documentation

## 2012-09-02 DIAGNOSIS — E785 Hyperlipidemia, unspecified: Secondary | ICD-10-CM | POA: Insufficient documentation

## 2012-09-02 DIAGNOSIS — Z8619 Personal history of other infectious and parasitic diseases: Secondary | ICD-10-CM | POA: Insufficient documentation

## 2012-09-02 DIAGNOSIS — E119 Type 2 diabetes mellitus without complications: Secondary | ICD-10-CM | POA: Insufficient documentation

## 2012-09-02 DIAGNOSIS — N189 Chronic kidney disease, unspecified: Secondary | ICD-10-CM | POA: Insufficient documentation

## 2012-09-02 DIAGNOSIS — Z87891 Personal history of nicotine dependence: Secondary | ICD-10-CM | POA: Insufficient documentation

## 2012-09-02 DIAGNOSIS — I5022 Chronic systolic (congestive) heart failure: Secondary | ICD-10-CM | POA: Insufficient documentation

## 2012-09-02 DIAGNOSIS — I2510 Atherosclerotic heart disease of native coronary artery without angina pectoris: Secondary | ICD-10-CM | POA: Insufficient documentation

## 2012-09-02 DIAGNOSIS — Z8679 Personal history of other diseases of the circulatory system: Secondary | ICD-10-CM | POA: Insufficient documentation

## 2012-09-02 DIAGNOSIS — Z79899 Other long term (current) drug therapy: Secondary | ICD-10-CM | POA: Insufficient documentation

## 2012-09-02 DIAGNOSIS — Z7901 Long term (current) use of anticoagulants: Secondary | ICD-10-CM | POA: Insufficient documentation

## 2012-09-02 DIAGNOSIS — I129 Hypertensive chronic kidney disease with stage 1 through stage 4 chronic kidney disease, or unspecified chronic kidney disease: Secondary | ICD-10-CM | POA: Insufficient documentation

## 2012-09-02 DIAGNOSIS — Z794 Long term (current) use of insulin: Secondary | ICD-10-CM | POA: Insufficient documentation

## 2012-09-02 DIAGNOSIS — M25569 Pain in unspecified knee: Secondary | ICD-10-CM | POA: Insufficient documentation

## 2012-09-02 DIAGNOSIS — Z8673 Personal history of transient ischemic attack (TIA), and cerebral infarction without residual deficits: Secondary | ICD-10-CM | POA: Insufficient documentation

## 2012-09-02 DIAGNOSIS — M25559 Pain in unspecified hip: Secondary | ICD-10-CM | POA: Insufficient documentation

## 2012-09-02 NOTE — ED Notes (Addendum)
C/o L knee pain x 2 weeks- no known injury.  Seen by PCP 3 days ago and had knee x-ray.  Scheduled to see orthopedic on 7/8.  Now reports lower back pain that radiates to bilateral hips and bilateral legs that started today- No known injury.  Reports history of sciatica.

## 2012-09-03 LAB — PROTIME-INR: INR: 1.78 — ABNORMAL HIGH (ref 0.00–1.49)

## 2012-09-03 MED ORDER — DIAZEPAM 5 MG PO TABS
2.5000 mg | ORAL_TABLET | Freq: Once | ORAL | Status: AC
  Administered 2012-09-03: 2.5 mg via ORAL
  Filled 2012-09-03: qty 1

## 2012-09-03 MED ORDER — DIAZEPAM 5 MG PO TABS: 2.5000 mg | ORAL_TABLET | Freq: Two times a day (BID) | ORAL | Status: DC

## 2012-09-03 MED ORDER — DIAZEPAM 5 MG PO TABS: 5.0000 mg | ORAL_TABLET | Freq: Two times a day (BID) | ORAL | Status: DC

## 2012-09-03 MED ORDER — TRAMADOL HCL 50 MG PO TABS
50.0000 mg | ORAL_TABLET | Freq: Once | ORAL | Status: AC
  Administered 2012-09-03: 50 mg via ORAL
  Filled 2012-09-03: qty 1

## 2012-09-03 NOTE — ED Notes (Signed)
Called Lab and phlebotomy x 4 regarding PT-INR results. Was informed by lab that tube station was not working properly and that phlebotomy would need to redraw blood and walk it down to Lab. Informed phlebotomy of Labs request and informed next shift RN, Amy of previous discourse with lab and phlebotomy.

## 2012-09-03 NOTE — ED Provider Notes (Signed)
History     CSN: [id number]  Arrival date & time 09/02/12  2137   None     Chief Complaint  Patient presents with  . Knee Pain  . Hip Pain  . Back Pain    (Consider location/radiation/quality/duration/timing/severity/associated sxs/prior treatment) HPI History provided by pt.   Pt c/o severe pain across lower back w/ radiation down both LEs.  This pain is chronic x 2 years and has been attributed to sciatica, but it acutely worsened this morning.  Mild relief w/ ultram.  No associated fever, LE weakness/paresthesias, bowel/bladder dysfunction or abdominal pain.  No recent injury.  She has f/u scheduled w/ her pain management specialist for a cortisone injection in 4 days and she has f/u scheduled w/ ortho for chronic L knee pain in 2 weeks.  She is on coumadin. Past Medical History  Diagnosis Date  . Chronic systolic heart failure   . Diabetes mellitus     type 1  . Hypertension   . Stroke 2010    eye doctor said she had TIA  . Ischemic cardiomyopathy   . Ventricular tachycardia     Polymorphic  . 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   . Atrial fibrillation     controlled with amiodarone, on coumadin  . Coronary artery disease 01/31/2011  . Chronic renal insufficiency   . Lumbar spondylosis 01/11/2012  . History of chicken pox   . Allergy   . Hyperlipidemia     Past Surgical History  Procedure Laterality Date  . Abdominal hysterectomy  1995  . Cholecystectomy  1985  . Tubaligation  1980  . Icd  2003/2007    implanted by Dr Alanda Amass, most recent generator change 2/13 by Dr Johney Frame, Analyze ST study patient    Family History  Problem Relation Age of Onset  . Diabetes Mother   . Heart disease Mother   . Hyperlipidemia Mother   . Hypertension Mother   . Heart disease Father   . Heart attack Father   . Hypertension Father   . Early death Brother 40    History  Substance Use Topics  . Smoking status: Former Smoker    Quit date: 08/16/1995  .  Smokeless tobacco: Never Used  . Alcohol Use: No    OB History   Grav Para Term Preterm Abortions TAB SAB Ect Mult Living                  Review of Systems  All other systems reviewed and are negative.    Allergies  Darvon; Promethazine hcl; and Plavix  Home Medications   Current Outpatient Rx  Name  Route  Sig  Dispense  Refill  . amiodarone (PACERONE) 200 MG tablet   Oral   Take 1 tablet (200 mg total) by mouth daily.   60 tablet   6   . carvedilol (COREG) 25 MG tablet   Oral   Take 25 mg by mouth 2 (two) times daily with a meal.         . cetirizine (ZYRTEC) 10 MG tablet   Oral   Take 10 mg by mouth daily as needed for allergies.         . Cholecalciferol (VITAMIN D3) 2000 UNITS TABS   Oral   Take 2,000 Units by mouth daily.          . diclofenac sodium (VOLTAREN) 1 % GEL   Topical   Apply 2 g topically 4 (four) times daily.  2 Tube   2   . enoxaparin (LOVENOX) 100 MG/ML injection   Subcutaneous   Inject 100 mg into the skin daily.         [name] gabapentin (NEURONTIN) 400 MG capsule   Oral   Take 400 mg by mouth 2 (two) times daily.          . insulin detemir (LEVEMIR) 100 UNIT/ML injection   Subcutaneous   Inject 65 Units into the skin 2 (two) times daily.          . insulin glulisine (APIDRA) 100 UNIT/ML injection   Subcutaneous   Inject 18 Units into the skin 3 (three) times daily before meals.   10 mL   12   . lisinopril (PRINIVIL,ZESTRIL) 5 MG tablet   Oral   Take 1 tablet (5 mg total) by mouth daily.         . nitroGLYCERIN (NITROSTAT) 0.4 MG SL tablet   Sublingual   Place 0.4 mg under the tongue every 5 (five) minutes as needed for chest pain.         . nizatidine (AXID) 150 MG capsule   Oral   Take 150 mg by mouth at bedtime.          . rosuvastatin (CRESTOR) 20 MG tablet   Oral   Take 20 mg by mouth at bedtime.         . Vitamin D, Ergocalciferol, (DRISDOL) 50000 UNITS CAPS   Oral   Take 50,000 Units by  mouth every 7 (seven) days. Monday         . warfarin (COUMADIN) 5 MG tablet   Oral   Take 2.5 mg by mouth daily.         . diazepam (VALIUM) 5 MG tablet   Oral   Take 1 tablet (5 mg total) by mouth 2 (two) times daily.   10 tablet   0     BP 167/66  Pulse 59  Temp(Src) 98.5 F (36.9 C) (Oral)  Resp 16  SpO2 97%  Physical Exam  Nursing note and vitals reviewed. Constitutional: She is oriented to person, place, and time. She appears well-developed and well-nourished. No distress.  obese  HENT:  Head: Normocephalic and atraumatic.  Eyes:  Normal appearance  Neck: Normal range of motion.  Cardiovascular: Normal rate and regular rhythm.   Pulmonary/Chest: Effort normal and breath sounds normal.  Genitourinary:  No CVA ttp  Musculoskeletal:  Entire back non-tender. Full active ROM of LE, including L knee. No erythema, warmth or edema of L knee. Nml patellar reflexes.  No saddle anesthesia. Distal sensation intact.  2+ DP pulses.  Ambulates w/out diffulty.   Neurological: She is alert and oriented to person, place, and time.  Skin: Skin is warm and dry. No rash noted.  Psychiatric: She has a normal mood and affect. Her behavior is normal.    ED Course  Procedures (including critical care time)  Labs Reviewed  PROTIME-INR   No results found.   1. Sciatica neuralgia, unspecified laterality       MDM  67yo F on coumadin presents w/ acute on chronic low back pain.  Is followed by pain management and is scheduled for cortisone injection next week.  Afebrile, NAD, no mid-line tenderness, no NV deficits of LEs on exam.  INR pending.  Pt to receive a dose of ultram and 2.5mg  valium for pain.  2:09 AM  Pt reports improvement in pain.  INR subtherapeutic; pt now reports  that coumadin has been discontinued d/t upcoming surgery and that she is on lovenox.  Has f/u with PCP on Monday.  Prescribed short course of valium.  Strict return precautions discussed.         Otilio Miu, PA-C 09/03/12 [phone]

## 2012-09-03 NOTE — ED Provider Notes (Signed)
Medical screening examination/treatment/procedure(s) were performed by non-physician practitioner and as supervising physician I was immediately available for consultation/collaboration.   Ahana Najera Y. Max Nuno, MD 09/03/12 1328 

## 2012-09-05 ENCOUNTER — Ambulatory Visit (INDEPENDENT_AMBULATORY_CARE_PROVIDER_SITE_OTHER): Payer: PRIVATE HEALTH INSURANCE

## 2012-09-05 DIAGNOSIS — I4891 Unspecified atrial fibrillation: Secondary | ICD-10-CM

## 2012-09-05 DIAGNOSIS — Z7901 Long term (current) use of anticoagulants: Secondary | ICD-10-CM

## 2012-09-06 ENCOUNTER — Encounter: Payer: Self-pay | Admitting: Physical Medicine & Rehabilitation

## 2012-09-06 ENCOUNTER — Ambulatory Visit: Payer: PRIVATE HEALTH INSURANCE | Admitting: Physical Medicine & Rehabilitation

## 2012-09-06 NOTE — Progress Notes (Unsigned)
  PROCEDURE RECORD [facility]   Name: [name] DOB:1946/02/21 MRN: [id number]  Date:09/06/2012  Physician: Claudette Laws, MD    Nurse/CMA: Shumaker RN  Allergies:  Allergies  Allergen Reactions  . Darvon Other (See Comments)    indigestion  . Promethazine Hcl Other (See Comments)    hyperactivity  . Plavix (Clopidogrel Bisulfate) Rash    Consent Signed: yes  Is patient diabetic? yes  CBG today? 219  Pregnant: no LMP: No LMP recorded. Patient has had a hysterectomy. (age 40-55)  Anticoagulants: yes (last dose was friday june 13th, and has been taking lovenox since saturday june the 14th.  ) Anti-inflammatory: no Antibiotics: no  Procedure:   Position: Prone Start Time:  End Time:   Fluoro Time:   RN/CMA Nature conservation officer    Time 10:20     BP 118/63     Pulse 80     Respirations 15     O2 Sat 91     S/S 6 6    Pain Level 7      D/C home with sister,jeanette, patient A & O X 3, D/C instructions reviewed, and sits independently.   Has only been off coumadin since Friday 09/02/12  Bridged with Lovenox, last dose Sunday AM. Injection not done today due to INR 1.4.  Recommendation INR 1.2 or below.  Instructed to recheck INR Wednesday 09/07/12 for injection on 09/08/12. Resume Lovenox tonight and tomorrow AM. If INR within acceptable range, injection on 09/08/12

## 2012-09-07 ENCOUNTER — Ambulatory Visit (INDEPENDENT_AMBULATORY_CARE_PROVIDER_SITE_OTHER): Payer: PRIVATE HEALTH INSURANCE | Admitting: *Deleted

## 2012-09-07 DIAGNOSIS — I4891 Unspecified atrial fibrillation: Secondary | ICD-10-CM

## 2012-09-07 DIAGNOSIS — Z7901 Long term (current) use of anticoagulants: Secondary | ICD-10-CM

## 2012-09-08 ENCOUNTER — Encounter: Payer: Self-pay | Admitting: Physical Medicine & Rehabilitation

## 2012-09-08 ENCOUNTER — Ambulatory Visit (HOSPITAL_BASED_OUTPATIENT_CLINIC_OR_DEPARTMENT_OTHER): Payer: PRIVATE HEALTH INSURANCE | Admitting: Physical Medicine & Rehabilitation

## 2012-09-08 ENCOUNTER — Encounter: Payer: PRIVATE HEALTH INSURANCE | Attending: Physical Medicine & Rehabilitation

## 2012-09-08 VITALS — BP 139/72 | HR 66 | Resp 16 | Ht 63.0 in | Wt 209.0 lb

## 2012-09-08 DIAGNOSIS — M545 Low back pain, unspecified: Secondary | ICD-10-CM | POA: Insufficient documentation

## 2012-09-08 DIAGNOSIS — N189 Chronic kidney disease, unspecified: Secondary | ICD-10-CM | POA: Insufficient documentation

## 2012-09-08 DIAGNOSIS — R609 Edema, unspecified: Secondary | ICD-10-CM | POA: Insufficient documentation

## 2012-09-08 DIAGNOSIS — M25569 Pain in unspecified knee: Secondary | ICD-10-CM | POA: Insufficient documentation

## 2012-09-08 DIAGNOSIS — I2510 Atherosclerotic heart disease of native coronary artery without angina pectoris: Secondary | ICD-10-CM | POA: Insufficient documentation

## 2012-09-08 DIAGNOSIS — IMO0002 Reserved for concepts with insufficient information to code with codable children: Secondary | ICD-10-CM

## 2012-09-08 DIAGNOSIS — I4891 Unspecified atrial fibrillation: Secondary | ICD-10-CM | POA: Insufficient documentation

## 2012-09-08 DIAGNOSIS — E109 Type 1 diabetes mellitus without complications: Secondary | ICD-10-CM | POA: Insufficient documentation

## 2012-09-08 DIAGNOSIS — I2589 Other forms of chronic ischemic heart disease: Secondary | ICD-10-CM | POA: Insufficient documentation

## 2012-09-08 DIAGNOSIS — I129 Hypertensive chronic kidney disease with stage 1 through stage 4 chronic kidney disease, or unspecified chronic kidney disease: Secondary | ICD-10-CM | POA: Insufficient documentation

## 2012-09-08 DIAGNOSIS — M25469 Effusion, unspecified knee: Secondary | ICD-10-CM | POA: Insufficient documentation

## 2012-09-08 NOTE — Patient Instructions (Signed)
May resume Coumadin tonight May resume Lovenox tonight

## 2012-09-08 NOTE — Progress Notes (Signed)
Lumbar Left L4-5 transforaminal epidural steroid injection under fluoroscopic guidance L>R pain from back to ankle CT Lumbar 01/19/2012  L4-5: Moderate central canal stenosis secondary to a central and  leftward disc protrusion, facet arthropathy, and ligamentum flavum  hypertrophy. 3 mm facet mediated anterolisthesis. Left L5 nerve  root encroachment in the lateral recess. No significant foraminal  narrowing. No right-sided neural encroachment.  L5-S1: Mild bulge. Mild facet arthropathy.  Compared with prior CT abdomen and pelvis, the appearance is  similar.  IMPRESSION:  No dominant right-sided neural encroachment observed.  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 Left L5 nerve root encroachment is observed.  Moderate to severe disc space narrowing L1-2 and L2-3 without  neural impingement.  Indication: Lumbosacral radiculitis is not relieved by medication management or other conservative care and interfering with self-care and mobility.   Informed consent was obtained after describing risk and benefits of the procedure with the patient, this includes bleeding, bruising, infection, paralysis and medication side effects.  The patient wishes to proceed and has given written consent.  Patient was placed in prone position.  The lumbar area was marked and prepped with Betadine.  It was entered with a 25-gauge 1-1/2 inch needle and one mL of 1% lidocaine was injected into the skin and subcutaneous tissue.  Then a 22-gauge 3.5 spinal needle was inserted into the L L4-5 intervertebral foramen under AP, lateral, and oblique view.  Then a solution containing one mL of 10 mg per mL dexamethasone and 2 mL of 1% lidocaine was injected.  The patient tolerated procedure well.  Post procedure instructions were given.  Please see post procedure form.

## 2012-09-08 NOTE — Progress Notes (Signed)
  PROCEDURE RECORD [facility]   Name: NOVALIE LEAMY DOB:[birth year] MRN: [id number]  Date:09/08/2012  Physician: Claudette Laws, MD    Nurse/CMA: Shumaker RN  Allergies:  Allergies  Allergen Reactions  . Darvon Other (See Comments)    indigestion  . Promethazine Hcl Other (See Comments)    hyperactivity  . Plavix (Clopidogrel Bisulfate) Rash    Consent Signed: yes  Is patient diabetic? yes  CBG today? 198  Pregnant: no LMP: No LMP recorded. Patient has had a hysterectomy. (age 26-55)  Anticoagulants: yes (last dose was 09-02-12, has been taking lovenox since 09-03-12. Last lovenox 09/07/12 am  PT/INR 1.1 today) Anti-inflammatory: no Antibiotics: no  Procedure: L4-5 Transforaminal Lumbar Epidural Steroid Injection Position: Prone Start Time:  12:15 End Time: 12:22 Fluoro Time: 28 seconds  RN/CMA [employer] RN    Time 1153 12:26    BP 137/72 133/57    Pulse 66 73    Respirations 16 16    O2 Sat 93 97    S/S 6 6    Pain Level 10/10 7/10     D/C home with daughter in law,mandy, patient A & O X 3, D/C instructions reviewed, and sits independently.

## 2012-09-13 ENCOUNTER — Ambulatory Visit (INDEPENDENT_AMBULATORY_CARE_PROVIDER_SITE_OTHER): Payer: PRIVATE HEALTH INSURANCE | Admitting: *Deleted

## 2012-09-13 ENCOUNTER — Ambulatory Visit: Payer: PRIVATE HEALTH INSURANCE | Admitting: Physical Medicine and Rehabilitation

## 2012-09-13 DIAGNOSIS — Z7901 Long term (current) use of anticoagulants: Secondary | ICD-10-CM

## 2012-09-13 DIAGNOSIS — I4891 Unspecified atrial fibrillation: Secondary | ICD-10-CM

## 2012-09-13 LAB — POCT INR: INR: 1.6

## 2012-09-15 ENCOUNTER — Telehealth: Payer: Self-pay | Admitting: Cardiology

## 2012-09-15 NOTE — Telephone Encounter (Signed)
Saw Dr Farris Has today and is going to see [name]/[name] 09/30/12 for possible knee surgery. Dr Farris Has suggested that she go ahead and find out what she will need to do in regards to her Coumadin. Will forward to Dr Shirlee Latch for review

## 2012-09-15 NOTE — Telephone Encounter (Signed)
New Problem  Pt states her provider is discussing possible doing some surgery on her knee. She said she needs to possibly come off of her coumadin.

## 2012-09-16 NOTE — Telephone Encounter (Signed)
She will need Lovenox bridge off coumadin given history of CVA

## 2012-09-16 NOTE — Telephone Encounter (Signed)
To coumadin clinic.  

## 2012-09-19 NOTE — Telephone Encounter (Signed)
Spoke with pt and she is aware to contact us after she sees Ortho doctor and if surgery is scheduled so we can do her Lovenox bridging.

## 2012-09-20 ENCOUNTER — Other Ambulatory Visit: Payer: Self-pay | Admitting: Cardiology

## 2012-09-20 ENCOUNTER — Ambulatory Visit (INDEPENDENT_AMBULATORY_CARE_PROVIDER_SITE_OTHER): Payer: PRIVATE HEALTH INSURANCE | Admitting: *Deleted

## 2012-09-20 DIAGNOSIS — I4891 Unspecified atrial fibrillation: Secondary | ICD-10-CM

## 2012-09-20 DIAGNOSIS — Z7901 Long term (current) use of anticoagulants: Secondary | ICD-10-CM

## 2012-09-20 LAB — POCT INR: INR: 2.7

## 2012-09-26 ENCOUNTER — Other Ambulatory Visit: Payer: Self-pay | Admitting: *Deleted

## 2012-09-26 MED ORDER — WARFARIN SODIUM 5 MG PO TABS: ORAL_TABLET | ORAL | Status: DC

## 2012-09-29 ENCOUNTER — Other Ambulatory Visit: Payer: Self-pay | Admitting: Internal Medicine

## 2012-09-30 ENCOUNTER — Ambulatory Visit (INDEPENDENT_AMBULATORY_CARE_PROVIDER_SITE_OTHER): Payer: PRIVATE HEALTH INSURANCE | Admitting: *Deleted

## 2012-09-30 DIAGNOSIS — I4891 Unspecified atrial fibrillation: Secondary | ICD-10-CM

## 2012-09-30 DIAGNOSIS — Z7901 Long term (current) use of anticoagulants: Secondary | ICD-10-CM

## 2012-10-04 ENCOUNTER — Telehealth: Payer: Self-pay | Admitting: *Deleted

## 2012-10-04 ENCOUNTER — Other Ambulatory Visit: Payer: Self-pay | Admitting: Internal Medicine

## 2012-10-04 NOTE — Telephone Encounter (Signed)
Spoke with pt advised Rx sent to pharmacy. 

## 2012-10-04 NOTE — Telephone Encounter (Signed)
Pt called requesting Omeprazole.  Please advise

## 2012-10-04 NOTE — Telephone Encounter (Signed)
Ok to refill 

## 2012-10-11 ENCOUNTER — Encounter: Payer: Self-pay | Admitting: Physical Medicine & Rehabilitation

## 2012-10-11 ENCOUNTER — Ambulatory Visit (HOSPITAL_BASED_OUTPATIENT_CLINIC_OR_DEPARTMENT_OTHER): Payer: PRIVATE HEALTH INSURANCE | Admitting: Physical Medicine & Rehabilitation

## 2012-10-11 ENCOUNTER — Encounter: Payer: PRIVATE HEALTH INSURANCE | Attending: Physical Medicine & Rehabilitation

## 2012-10-11 VITALS — BP 104/60 | HR 68 | Resp 14 | Ht 63.0 in | Wt 208.8 lb

## 2012-10-11 DIAGNOSIS — R609 Edema, unspecified: Secondary | ICD-10-CM | POA: Insufficient documentation

## 2012-10-11 DIAGNOSIS — M545 Low back pain, unspecified: Secondary | ICD-10-CM | POA: Insufficient documentation

## 2012-10-11 DIAGNOSIS — M25569 Pain in unspecified knee: Secondary | ICD-10-CM | POA: Insufficient documentation

## 2012-10-11 DIAGNOSIS — M25469 Effusion, unspecified knee: Secondary | ICD-10-CM | POA: Insufficient documentation

## 2012-10-11 DIAGNOSIS — I4891 Unspecified atrial fibrillation: Secondary | ICD-10-CM | POA: Insufficient documentation

## 2012-10-11 DIAGNOSIS — M48062 Spinal stenosis, lumbar region with neurogenic claudication: Secondary | ICD-10-CM

## 2012-10-11 DIAGNOSIS — I2589 Other forms of chronic ischemic heart disease: Secondary | ICD-10-CM | POA: Insufficient documentation

## 2012-10-11 DIAGNOSIS — N189 Chronic kidney disease, unspecified: Secondary | ICD-10-CM | POA: Insufficient documentation

## 2012-10-11 DIAGNOSIS — I2510 Atherosclerotic heart disease of native coronary artery without angina pectoris: Secondary | ICD-10-CM | POA: Insufficient documentation

## 2012-10-11 DIAGNOSIS — I129 Hypertensive chronic kidney disease with stage 1 through stage 4 chronic kidney disease, or unspecified chronic kidney disease: Secondary | ICD-10-CM | POA: Insufficient documentation

## 2012-10-11 DIAGNOSIS — E109 Type 1 diabetes mellitus without complications: Secondary | ICD-10-CM | POA: Insufficient documentation

## 2012-10-11 MED ORDER — TRAMADOL HCL 50 MG PO TABS: 50.0000 mg | ORAL_TABLET | Freq: Two times a day (BID) | ORAL | Status: DC

## 2012-10-11 NOTE — Progress Notes (Signed)
Subjective:    Patient ID: [name], female    DOB: 06-04-1945, 67 y.o.   MRN: [id number]  HPI Lumbar Left L4-5 transforaminal epidural steroid injection under fluoroscopic guidance September 08 2012 Helpful for leg pain, low back pain at rest is ok but unable to do housework due to back pain. Knee pain continues, has seen orthopedics and arthroscopic surgery is planned.   Pain Inventory Average Pain 4 Pain Right Now 4 My pain is dull  In the last 24 hours, has pain interfered with the following? General activity 4 Relation with others 2 Enjoyment of life 5 What TIME of day is your pain at its worst? all Sleep (in general) Good  Pain is worse with: walking, bending, standing and some activites Pain improves with: medication Relief from Meds: 5  Mobility walk without assistance how many minutes can you walk? 20 ability to climb steps?  no do you drive?  yes  Function disabled: date disabled 2007  Neuro/Psych No problems in this area  Prior Studies Any changes since last visit?  no  Physicians involved in your care Primary care Nicki Reaper NP   Family History  Problem Relation Age of Onset  . Diabetes Mother   . Heart disease Mother   . Hyperlipidemia Mother   . Hypertension Mother   . Heart disease Father   . Heart attack Father   . Hypertension Father   . Early death Brother 8   History   Social History  . Marital Status: Divorced    Spouse Name: N/A    Number of Children: 3  . Years of Education: 12   Occupational History  . Retried    Social History Main Topics  . Smoking status: Former Smoker    Quit date: 08/16/1995  . Smokeless tobacco: Never Used  . Alcohol Use: No  . Drug Use: No  . Sexually Active: None   Other Topics Concern  . None   Social History Narrative   Regular exercise-no   Caffeine Use-no   Past Surgical History  Procedure Laterality Date  . Abdominal hysterectomy  1995  . Cholecystectomy  1985  .  Tubaligation  1980  . Icd  2003/2007    implanted by Dr Alanda Amass, most recent generator change 2/13 by Dr Johney Frame, Analyze ST study patient   Past Medical History  Diagnosis Date  . Chronic systolic heart failure   . Diabetes mellitus     type 1  . Hypertension   . Stroke 2010    eye doctor said she had TIA  . Ischemic cardiomyopathy   . Ventricular tachycardia     Polymorphic  . 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   . Atrial fibrillation     controlled with amiodarone, on coumadin  . Coronary artery disease 01/31/2011  . Chronic renal insufficiency   . Lumbar spondylosis 01/11/2012  . History of chicken pox   . Allergy   . Hyperlipidemia    BP 104/60  Pulse 68  Resp 14  Ht 5\' 3"  (1.6 m)  Wt 208 lb 12.8 oz (94.711 kg)  BMI 37 kg/m2  SpO2 93%    Review of Systems  Constitutional: Positive for unexpected weight change.  Endocrine:       High blood sugar  Musculoskeletal: Positive for back pain and arthralgias.       Knee pain  All other systems reviewed and are negative.       Objective:   Physical Exam  Nursing note and vitals reviewed. Constitutional: She is oriented to person, place, and time. She appears well-developed.  obese  HENT:  Head: Normocephalic and atraumatic.  Eyes: Conjunctivae and EOM are normal. Pupils are equal, round, and reactive to light.  Neck: Normal range of motion.  Musculoskeletal:       Lumbar back: She exhibits decreased range of motion. She exhibits no tenderness and no spasm.  Negative straight leg raising Decreased lumbar flexion and extension  Neurological: She is alert and oriented to person, place, and time. She has normal strength. No sensory deficit.  Psychiatric: She has a normal mood and affect.          Assessment & Plan:  #1. Left L4 radiculitis improved after transforaminal epidural steroid injection. Patient has continued back pain with activity. We discussed that surgery usually works better for  leg pain then for back pain but we will make a referral to a spine surgeon for evaluation of lumbar stenosis 2. Knee pain which did not respond to ultrasound guided injection. Has diagnostic arthroscopy procedure planned.  Over half of the 25 min visit was spent counseling and coordinating care.

## 2012-10-12 ENCOUNTER — Telehealth: Payer: Self-pay | Admitting: *Deleted

## 2012-10-12 NOTE — Telephone Encounter (Signed)
She needs to be compliant with her medication regimen and diet. I asked her to call me back with her blood sugars so that I can titrate her medication to get her A1C and she has not yet given me a call back.

## 2012-10-12 NOTE — Telephone Encounter (Signed)
Discussed with sherry @ Dr. Greig Right office.

## 2012-10-12 NOTE — Telephone Encounter (Signed)
Sherry @ Dr. Greig Right office left a msg stating that they received the fax stating that the pt has not been cleared for knee surgery due to her A1C being elevated. Cordelia Pen would like to know what the pt needs to do to get this under control so the pt can be scheduled ASAP for surgery. Please advise.

## 2012-10-20 ENCOUNTER — Other Ambulatory Visit: Payer: Self-pay | Admitting: Cardiology

## 2012-10-25 ENCOUNTER — Ambulatory Visit (INDEPENDENT_AMBULATORY_CARE_PROVIDER_SITE_OTHER): Payer: PRIVATE HEALTH INSURANCE | Admitting: Pharmacist

## 2012-10-25 DIAGNOSIS — Z7901 Long term (current) use of anticoagulants: Secondary | ICD-10-CM

## 2012-10-25 DIAGNOSIS — I4891 Unspecified atrial fibrillation: Secondary | ICD-10-CM

## 2012-10-26 ENCOUNTER — Ambulatory Visit: Payer: PRIVATE HEALTH INSURANCE | Admitting: Cardiology

## 2012-11-02 ENCOUNTER — Other Ambulatory Visit: Payer: Self-pay

## 2012-11-02 MED ORDER — LISINOPRIL 5 MG PO TABS: 5.0000 mg | ORAL_TABLET | Freq: Every day | ORAL | Status: DC

## 2012-11-02 MED ORDER — CARVEDILOL 25 MG PO TABS: 25.0000 mg | ORAL_TABLET | Freq: Two times a day (BID) | ORAL | Status: DC

## 2012-11-03 ENCOUNTER — Other Ambulatory Visit: Payer: Self-pay | Admitting: *Deleted

## 2012-11-03 MED ORDER — OMEPRAZOLE 20 MG PO CPDR: DELAYED_RELEASE_CAPSULE | ORAL | Status: DC

## 2012-11-03 MED ORDER — ROSUVASTATIN CALCIUM 20 MG PO TABS: ORAL_TABLET | ORAL | Status: DC

## 2012-11-04 ENCOUNTER — Telehealth: Payer: Self-pay | Admitting: *Deleted

## 2012-11-04 ENCOUNTER — Other Ambulatory Visit: Payer: Self-pay | Admitting: *Deleted

## 2012-11-04 NOTE — Telephone Encounter (Signed)
Unable to reach patient to see if she is still taking furosemide or has it been discontinued.   Micki Riley, CMA

## 2012-11-10 ENCOUNTER — Ambulatory Visit (INDEPENDENT_AMBULATORY_CARE_PROVIDER_SITE_OTHER): Payer: PRIVATE HEALTH INSURANCE | Admitting: Cardiology

## 2012-11-10 ENCOUNTER — Encounter: Payer: Self-pay | Admitting: Cardiology

## 2012-11-10 ENCOUNTER — Ambulatory Visit (INDEPENDENT_AMBULATORY_CARE_PROVIDER_SITE_OTHER): Payer: PRIVATE HEALTH INSURANCE | Admitting: *Deleted

## 2012-11-10 VITALS — BP 130/62 | HR 64 | Ht 63.0 in | Wt 210.0 lb

## 2012-11-10 DIAGNOSIS — I255 Ischemic cardiomyopathy: Secondary | ICD-10-CM

## 2012-11-10 DIAGNOSIS — I4891 Unspecified atrial fibrillation: Secondary | ICD-10-CM

## 2012-11-10 DIAGNOSIS — I472 Ventricular tachycardia: Secondary | ICD-10-CM

## 2012-11-10 DIAGNOSIS — I2589 Other forms of chronic ischemic heart disease: Secondary | ICD-10-CM

## 2012-11-10 DIAGNOSIS — Z95810 Presence of automatic (implantable) cardiac defibrillator: Secondary | ICD-10-CM

## 2012-11-10 DIAGNOSIS — I351 Nonrheumatic aortic (valve) insufficiency: Secondary | ICD-10-CM

## 2012-11-10 DIAGNOSIS — I4729 Other ventricular tachycardia: Secondary | ICD-10-CM

## 2012-11-10 DIAGNOSIS — I2510 Atherosclerotic heart disease of native coronary artery without angina pectoris: Secondary | ICD-10-CM

## 2012-11-10 DIAGNOSIS — R0602 Shortness of breath: Secondary | ICD-10-CM

## 2012-11-10 DIAGNOSIS — Z7901 Long term (current) use of anticoagulants: Secondary | ICD-10-CM

## 2012-11-10 DIAGNOSIS — I359 Nonrheumatic aortic valve disorder, unspecified: Secondary | ICD-10-CM

## 2012-11-10 LAB — BASIC METABOLIC PANEL
BUN: 35 mg/dL — ABNORMAL HIGH (ref 6–23)
Calcium: 9 mg/dL (ref 8.4–10.5)
Creatinine, Ser: 1.6 mg/dL — ABNORMAL HIGH (ref 0.4–1.2)
GFR: 35.32 mL/min — ABNORMAL LOW (ref >60.00)
Glucose, Bld: 203 mg/dL — ABNORMAL HIGH (ref 70–99)

## 2012-11-10 LAB — HEPATIC FUNCTION PANEL
ALT: 22 U/L (ref 0–35)
Albumin: 3.6 g/dL (ref 3.5–5.2)
Bilirubin, Direct: 0 mg/dL (ref 0.0–0.3)
Total Protein: 6.9 g/dL (ref 6.0–8.3)

## 2012-11-10 LAB — BRAIN NATRIURETIC PEPTIDE: Pro B Natriuretic peptide (BNP): 39 pg/mL (ref 0.0–100.0)

## 2012-11-10 MED ORDER — FUROSEMIDE 20 MG PO TABS: ORAL_TABLET | ORAL | Status: DC

## 2012-11-10 NOTE — Patient Instructions (Addendum)
Take lasix (furosemide) 20mg  every other day.   Your physician recommends that you have lab today--BMET/BNP/TSH/Liver profile.  Your physician recommends that you return for lab work in: 2 weeks --BMET.  Your physician has requested that you have an echocardiogram. Echocardiography is a painless test that uses sound waves to create images of your heart. It provides your doctor with information about the size and shape of your heart and how well your heart's chambers and valves are working. This procedure takes approximately one hour. There are no restrictions for this procedure. You can schedule this in 2 weeks when you come back for lab work.    Your physician recommends that you schedule a follow-up appointment in: 4 months with Dr Shirlee Latch.

## 2012-11-11 ENCOUNTER — Encounter: Payer: Self-pay | Admitting: Internal Medicine

## 2012-11-11 DIAGNOSIS — I351 Nonrheumatic aortic (valve) insufficiency: Secondary | ICD-10-CM | POA: Insufficient documentation

## 2012-11-11 LAB — ICD DEVICE OBSERVATION
AL AMPLITUDE: 2.6 mv
AL THRESHOLD: 0.75 V
DEVICE MODEL ICD: 819806
HV IMPEDENCE: 45 Ohm
PACEART VT: 0
RV LEAD IMPEDENCE ICD: 575 Ohm
TOT-0006: 20130212000000
TOT-0007: 1
TOT-0009: 1
TOT-0010: 6
TZAT-0013SLOWVT: 3
TZAT-0018SLOWVT: NEGATIVE
TZAT-0020SLOWVT: 1 ms
TZON-0003SLOWVT: 340 ms
TZON-0005SLOWVT: 6
TZST-0001SLOWVT: 2
TZST-0001SLOWVT: 4
TZST-0003SLOWVT: 40 J
VF: 0

## 2012-11-11 NOTE — Progress Notes (Signed)
icd check in clinic  

## 2012-11-11 NOTE — Progress Notes (Signed)
Patient ID: [name], female   DOB: [birth year], 67 y.o.   MRN: [id number] PCP: Nicki Reaper  67 yo with history of CAD, ischemic cardiomyopathy, and atrial fibrillation presents for cardiology followup.  Patient was hospitalized in 11/12 at [facility] for VT with ICD discharge.  She had a left heart cath showing patent stents.  EF was 35-40% by echo.  She is on amiodarone.  Since last appointment, her creatinine was noted to increase to as high as 2.5.  Lasix and spironolactone were stopped.  Weight is up 5 lbs.  She is short of breath when she walks outside in the heat.  No orthopnea or PND.  Generally stable but does feel like she has retained some fluid off Lasix.  No chest pain.  No lightheadedness or syncope.    ECG: a-paced, nonspecific ST-T changes.   Labs (10/12): LDL 73, HDL 44 Labs (11/12): K 3.9, creatinine 1.1, LFTs normal, TSH normal Labs (12/12): K 3.9, creatinine 1.5, proBNP 18 Labs (1/13): LDL 82, HDL 57 Labs (2/13): K 4.3, creatinine 1.5 Labs (5/13): creatinine 2.4 => 1.7, LFTs normal, TSH normal, proBNP 18 Labs (6/13): K 4.2, creatinine 1.7 Labs (10/13): K 4.3, creatinine 1.4 Labs (4/14); LFTs normal, TSH normal, LDL 71, HDL 58 Labs (5/14): K 4.5, creatinine 1.4  PMH: 1. Diabetes mellitus 2. CVA, TIA in 2010 3. HTN 4. CKD 5. H/o TAH 6. H/o CCY 7. Sciatica 8. Atrial fibrillation 9. CAD: s/p LAD and RCA PCI.  Last LHC in 11/12 with patent proximal LAD stent, ostial 70% D1 (jailed by stent), mild LAD stent patent, patent RCA stents, EF 40% with global hypokinesis.  10. Ischemic cardiomyopathy: Echo (10/12): EF 35-40%, moderate focal basal septal hypertrophy, inferior akinesis, grade I diastolic dysfunction, moderate aortic insufficiency. St Jude dual chamber ICD.  Spironolactone stopped when creatinine rose to 2.5.  11. Aortic insufficiency: moderate.  12. 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   SH: Divorced.  3 children.  Quit smoking in 1997. Lives in [place].   [place].   FH: CAD  ROS: All systems reviewed and negative except as per HPI.   Current Outpatient Prescriptions  Medication Sig Dispense Refill  . amiodarone (PACERONE) 200 MG tablet TAKE 1 TABLET (200 MG TOTAL) BY MOUTH DAILY.  60 tablet  6  . carvedilol (COREG) 25 MG tablet Take 1 tablet (25 mg total) by mouth 2 (two) times daily with a meal.  180 tablet  2  . cetirizine (ZYRTEC) 10 MG tablet Take 10 mg by mouth daily as needed for allergies.      . Cholecalciferol (VITAMIN D3) 2000 UNITS TABS Take 2,000 Units by mouth daily.       . diazepam (VALIUM) 5 MG tablet Take 0.5 tablets (2.5 mg total) by mouth 2 (two) times daily.  10 tablet  0  . diclofenac sodium (VOLTAREN) 1 % GEL Apply 2 g topically 4 (four) times daily.  2 Tube  2  . gabapentin (NEURONTIN) 400 MG capsule Take 400 mg by mouth 2 (two) times daily.       . insulin detemir (LEVEMIR) 100 UNIT/ML injection Inject 65 Units into the skin 2 (two) times daily.       . insulin glulisine (APIDRA) 100 UNIT/ML injection Inject 18 Units into the skin 3 (three) times daily before meals.  10 mL  12  . lisinopril (PRINIVIL,ZESTRIL) 5 MG tablet Take 1 tablet (5 mg total) by mouth daily.  90 tablet  2  .  Multiple Vitamins-Minerals (EYE VITAMINS PO) Take by mouth daily.      . nitroGLYCERIN (NITROSTAT) 0.4 MG SL tablet Place 0.4 mg under the tongue every 5 (five) minutes as needed for chest pain.      . nizatidine (AXID) 150 MG capsule Take 150 mg by mouth at bedtime.       [name] omeprazole (PRILOSEC) 20 MG capsule TAKE ONE CAPSULE BY MOUTH EVERY DAY  30 capsule  5  . rosuvastatin (CRESTOR) 20 MG tablet TAKE 1 TABLET BY MOUTH EVERY DAY  30 tablet  1  . traMADol (ULTRAM) 50 MG tablet Take 1 tablet (50 mg total) by mouth 2 (two) times daily.  60 tablet  1  . traMADol-acetaminophen (ULTRACET) 37.5-325 MG per tablet       . Vitamin D, Ergocalciferol, (DRISDOL) 50000 UNITS CAPS TAKE ONE CAPSULE BY MOUTH ONCE A WEEK  4 capsule  7  . warfarin  (COUMADIN) 2.5 MG tablet TAKE AS DIRECTED BY COUMADIN CLINIC  30 tablet  2  . furosemide (LASIX) 20 MG tablet 1 tablet every other day  15 tablet  4   No current facility-administered medications for this visit.    BP 130/62  Pulse 64  Ht 5\' 3"  (1.6 m)  Wt 95.255 kg (210 lb)  BMI 37.21 kg/m2 General: NAD, obese Neck: Thick, JVP difficult, no thyromegaly or thyroid nodule.  Lungs: Clear to auscultation bilaterally with normal respiratory effort. CV: Nondisplaced PMI.  Heart regular S1/S2, no S3/S4, 2/6 early SEM.  Trace ankle edema.  No carotid bruit.  Normal pedal pulses.  Abdomen: Soft, nontender, no hepatosplenomegaly, no distention.  Neurologic: Alert and oriented x 3.  Psych: Normal affect. Extremities: No clubbing or cyanosis.   Assessment/Plan  1. Atrial fibrillation  Paroxysmal, not in atrial fibrillation today. Continue warfarin. Given history of CVA, needs Lovenox bridge if she has to stop warfarin at any point of time. She is on amiodarone. Repeats TSH and LFTs today.  She gets yearly eye exams.  2. Coronary artery disease   Nonobstructive disease on last cath. As she is on warfarin also with stable CAD, she is not on aspirin. Continue statin, good lipids in 4/14.  3. Ventricular tachycardia On amiodarone with suppression of VT.  4. Ischemic cardiomyopathy NYHA class II-III symptoms. She has gained weight off Lasix and feels like she is retaining fluid.  Exam is difficult for volume.  - Continue current doses of Coreg and lisinopril.  She is off spironolactone since recent AKI episode.  - I will start her back on Lasix 20 mg every other day.  BMET in 2 wks.   5. Aortic insufficiency Moderate on echo 2 years ago.  I will repeat an echo to follow AI and also LV systolic function.   [name] 11/11/2012

## 2012-11-14 ENCOUNTER — Encounter: Payer: Self-pay | Admitting: Internal Medicine

## 2012-11-16 ENCOUNTER — Encounter: Payer: PRIVATE HEALTH INSURANCE | Admitting: Internal Medicine

## 2012-11-24 ENCOUNTER — Other Ambulatory Visit (INDEPENDENT_AMBULATORY_CARE_PROVIDER_SITE_OTHER): Payer: PRIVATE HEALTH INSURANCE

## 2012-11-24 ENCOUNTER — Other Ambulatory Visit (HOSPITAL_COMMUNITY): Payer: PRIVATE HEALTH INSURANCE

## 2012-11-24 DIAGNOSIS — I2589 Other forms of chronic ischemic heart disease: Secondary | ICD-10-CM

## 2012-11-24 DIAGNOSIS — I4891 Unspecified atrial fibrillation: Secondary | ICD-10-CM

## 2012-11-24 LAB — BASIC METABOLIC PANEL
BUN: 42 mg/dL — ABNORMAL HIGH (ref 6–23)
Calcium: 8.8 mg/dL (ref 8.4–10.5)
GFR: 32.64 mL/min — ABNORMAL LOW (ref >60.00)
Glucose, Bld: 247 mg/dL — ABNORMAL HIGH (ref 70–99)

## 2012-11-28 ENCOUNTER — Other Ambulatory Visit: Payer: Self-pay | Admitting: Internal Medicine

## 2012-11-29 ENCOUNTER — Encounter: Payer: Self-pay | Admitting: Internal Medicine

## 2012-11-29 ENCOUNTER — Other Ambulatory Visit: Payer: Self-pay | Admitting: *Deleted

## 2012-11-29 DIAGNOSIS — I2589 Other forms of chronic ischemic heart disease: Secondary | ICD-10-CM

## 2012-11-29 DIAGNOSIS — I4891 Unspecified atrial fibrillation: Secondary | ICD-10-CM

## 2012-11-30 ENCOUNTER — Encounter: Payer: PRIVATE HEALTH INSURANCE | Admitting: Internal Medicine

## 2012-12-01 ENCOUNTER — Ambulatory Visit (INDEPENDENT_AMBULATORY_CARE_PROVIDER_SITE_OTHER): Payer: PRIVATE HEALTH INSURANCE | Admitting: *Deleted

## 2012-12-01 DIAGNOSIS — I4891 Unspecified atrial fibrillation: Secondary | ICD-10-CM

## 2012-12-01 DIAGNOSIS — Z7901 Long term (current) use of anticoagulants: Secondary | ICD-10-CM

## 2012-12-05 ENCOUNTER — Encounter: Payer: Self-pay | Admitting: Internal Medicine

## 2012-12-06 ENCOUNTER — Ambulatory Visit: Payer: PRIVATE HEALTH INSURANCE | Admitting: Physical Medicine & Rehabilitation

## 2012-12-09 ENCOUNTER — Other Ambulatory Visit: Payer: Self-pay | Admitting: Internal Medicine

## 2012-12-12 ENCOUNTER — Encounter: Payer: Self-pay | Admitting: Internal Medicine

## 2012-12-14 ENCOUNTER — Other Ambulatory Visit: Payer: PRIVATE HEALTH INSURANCE

## 2012-12-14 ENCOUNTER — Encounter: Payer: PRIVATE HEALTH INSURANCE | Admitting: Internal Medicine

## 2012-12-15 ENCOUNTER — Other Ambulatory Visit (INDEPENDENT_AMBULATORY_CARE_PROVIDER_SITE_OTHER): Payer: PRIVATE HEALTH INSURANCE

## 2012-12-15 ENCOUNTER — Encounter: Payer: Self-pay | Admitting: Internal Medicine

## 2012-12-15 ENCOUNTER — Ambulatory Visit (INDEPENDENT_AMBULATORY_CARE_PROVIDER_SITE_OTHER): Payer: PRIVATE HEALTH INSURANCE | Admitting: Internal Medicine

## 2012-12-15 VITALS — BP 118/68 | HR 80 | Temp 98.6°F | Ht 63.0 in | Wt 208.5 lb

## 2012-12-15 DIAGNOSIS — E669 Obesity, unspecified: Secondary | ICD-10-CM

## 2012-12-15 DIAGNOSIS — I10 Essential (primary) hypertension: Secondary | ICD-10-CM | POA: Insufficient documentation

## 2012-12-15 DIAGNOSIS — E1151 Type 2 diabetes mellitus with diabetic peripheral angiopathy without gangrene: Secondary | ICD-10-CM | POA: Insufficient documentation

## 2012-12-15 DIAGNOSIS — IMO0002 Reserved for concepts with insufficient information to code with codable children: Secondary | ICD-10-CM | POA: Insufficient documentation

## 2012-12-15 DIAGNOSIS — I4891 Unspecified atrial fibrillation: Secondary | ICD-10-CM

## 2012-12-15 DIAGNOSIS — E1065 Type 1 diabetes mellitus with hyperglycemia: Secondary | ICD-10-CM

## 2012-12-15 DIAGNOSIS — Z23 Encounter for immunization: Secondary | ICD-10-CM

## 2012-12-15 DIAGNOSIS — E119 Type 2 diabetes mellitus without complications: Secondary | ICD-10-CM | POA: Insufficient documentation

## 2012-12-15 DIAGNOSIS — M25569 Pain in unspecified knee: Secondary | ICD-10-CM

## 2012-12-15 DIAGNOSIS — M25562 Pain in left knee: Secondary | ICD-10-CM

## 2012-12-15 DIAGNOSIS — I2510 Atherosclerotic heart disease of native coronary artery without angina pectoris: Secondary | ICD-10-CM

## 2012-12-15 DIAGNOSIS — I2589 Other forms of chronic ischemic heart disease: Secondary | ICD-10-CM

## 2012-12-15 LAB — BASIC METABOLIC PANEL
BUN: 36 mg/dL — ABNORMAL HIGH (ref 6–23)
BUN: 36 mg/dL — ABNORMAL HIGH (ref 6–23)
Calcium: 9 mg/dL (ref 8.4–10.5)
Calcium: 9.1 mg/dL (ref 8.4–10.5)
GFR: 30.32 mL/min — ABNORMAL LOW (ref >60.00)
GFR: 30.92 mL/min — ABNORMAL LOW (ref >60.00)
Glucose, Bld: 87 mg/dL (ref 70–99)
Glucose, Bld: 90 mg/dL (ref 70–99)
Potassium: 4.3 mEq/L (ref 3.5–5.1)
Sodium: 143 mEq/L (ref 135–145)

## 2012-12-15 LAB — CBC
HCT: 37.9 % (ref 36.0–46.0)
Hemoglobin: 12.9 g/dL (ref 12.0–15.0)
MCV: 88.3 fl (ref 78.0–100.0)
Platelets: 242 10*3/uL (ref 150.0–400.0)
RBC: 4.3 Mil/uL (ref 3.87–5.11)
WBC: 6.1 10*3/uL (ref 4.5–10.5)

## 2012-12-15 LAB — LIPID PANEL
LDL Cholesterol: 75 mg/dL (ref 0–99)
VLDL: 25.4 mg/dL (ref 0.0–40.0)

## 2012-12-15 LAB — HEMOGLOBIN A1C: Hgb A1c MFr Bld: 9.1 % — ABNORMAL HIGH (ref 4.6–6.5)

## 2012-12-15 NOTE — Patient Instructions (Signed)
Diabetes Meal Planning Guide The diabetes meal planning guide is a tool to help you plan your meals and snacks. It is important for people with diabetes to manage their blood glucose (sugar) levels. Choosing the right foods and the right amounts throughout your day will help control your blood glucose. Eating right can even help you improve your blood pressure and reach or maintain a healthy weight. CARBOHYDRATE COUNTING MADE EASY When you eat carbohydrates, they turn to sugar. This raises your blood glucose level. Counting carbohydrates can help you control this level so you feel better. When you plan your meals by counting carbohydrates, you can have more flexibility in what you eat and balance your medicine with your food intake. Carbohydrate counting simply means adding up the total amount of carbohydrate grams in your meals and snacks. Try to eat about the same amount at each meal. Foods with carbohydrates are listed below. Each portion below is 1 carbohydrate serving or 15 grams of carbohydrates. Ask your dietician how many grams of carbohydrates you should eat at each meal or snack. Grains and Starches  1 slice bread.   English muffin or hotdog/hamburger bun.   cup cold cereal (unsweetened).   cup cooked pasta or rice.   cup starchy vegetables (corn, potatoes, peas, beans, winter squash).  1 tortilla (6 inches).   bagel.  1 waffle or pancake (size of a CD).   cup cooked cereal.  4 to 6 small crackers. *Whole grain is recommended. Fruit  1 cup fresh unsweetened berries, melon, papaya, pineapple.  1 small fresh fruit.   banana or mango.   cup fruit juice (4 oz unsweetened).   cup canned fruit in natural juice or water.  2 tbs dried fruit.  12 to 15 grapes or cherries. Milk and Yogurt  1 cup fat-free or 1% milk.  1 cup soy milk.  6 oz light yogurt with sugar-free sweetener.  6 oz low-fat soy yogurt.  6 oz plain yogurt. Vegetables  1 cup raw or  cup  cooked is counted as 0 carbohydrates or a "free" food.  If you eat 3 or more servings at 1 meal, count them as 1 carbohydrate serving. Other Carbohydrates   oz chips or pretzels.   cup ice cream or frozen yogurt.   cup sherbet or sorbet.  2 inch square cake, no frosting.  1 tbs honey, sugar, jam, jelly, or syrup.  2 small cookies.  3 squares of graham crackers.  3 cups popcorn.  6 crackers.  1 cup broth-based soup.  Count 1 cup casserole or other mixed foods as 2 carbohydrate servings.  Foods with less than 20 calories in a serving may be counted as 0 carbohydrates or a "free" food. You may want to purchase a book or computer software that lists the carbohydrate gram counts of different foods. In addition, the nutrition facts panel on the labels of the foods you eat are a good source of this information. The label will tell you how big the serving size is and the total number of carbohydrate grams you will be eating per serving. Divide this number by 15 to obtain the number of carbohydrate servings in a portion. Remember, 1 carbohydrate serving equals 15 grams of carbohydrate. SERVING SIZES Measuring foods and serving sizes helps you make sure you are getting the right amount of food. The list below tells how big or small some common serving sizes are.  1 oz.........4 stacked dice.  3 oz.........Deck of cards.  1 tsp........Tip   of little finger.  1 tbs......Marland KitchenMarland KitchenThumb.  2 tbs.......Marland KitchenGolf ball.   cup......Marland KitchenHalf of a fist.  1 cup.......Marland KitchenA fist. SAMPLE DIABETES MEAL PLAN Below is a sample meal plan that includes foods from the grain and starches, dairy, vegetable, fruit, and meat groups. A dietician can individualize a meal plan to fit your calorie needs and tell you the number of servings needed from each food group. However, controlling the total amount of carbohydrates in your meal or snack is more important than making sure you include all of the food groups at every  meal. You may interchange carbohydrate containing foods (dairy, starches, and fruits). The meal plan below is an example of a 2000 calorie diet using carbohydrate counting. This meal plan has 17 carbohydrate servings. Breakfast  1 cup oatmeal (2 carb servings).   cup light yogurt (1 carb serving).  1 cup blueberries (1 carb serving).   cup almonds. Snack  1 large apple (2 carb servings).  1 low-fat string cheese stick. Lunch  Chicken breast salad.  1 cup spinach.   cup chopped tomatoes.  2 oz chicken breast, sliced.  2 tbs low-fat [place] dressing.  12 whole-wheat crackers (2 carb servings).  12 to 15 grapes (1 carb serving).  1 cup low-fat milk (1 carb serving). Snack  1 cup carrots.   cup hummus (1 carb serving). Dinner  3 oz broiled salmon.  1 cup brown rice (3 carb servings). Snack  1  cups steamed broccoli (1 carb serving) drizzled with 1 tsp olive oil and lemon juice.  1 cup light pudding (2 carb servings). DIABETES MEAL PLANNING WORKSHEET Your dietician can use this worksheet to help you decide how many servings of foods and what types of foods are right for you.  BREAKFAST Food Group and Servings / Carb Servings Grain/Starches __________________________________ Dairy __________________________________________ Vegetable ______________________________________ Fruit ___________________________________________ Meat __________________________________________ Fat ____________________________________________ LUNCH Food Group and Servings / Carb Servings Grain/Starches ___________________________________ Dairy ___________________________________________ Fruit ____________________________________________ Meat ___________________________________________ Fat _____________________________________________ [name] Food Group and Servings / Carb Servings Grain/Starches ___________________________________ Dairy  ___________________________________________ Fruit ____________________________________________ Meat ___________________________________________ Fat _____________________________________________ SNACKS Food Group and Servings / Carb Servings Grain/Starches ___________________________________ Dairy ___________________________________________ Vegetable _______________________________________ Fruit ____________________________________________ Meat ___________________________________________ Fat _____________________________________________ DAILY TOTALS Starches _________________________ Vegetable ________________________ Fruit ____________________________ Dairy ____________________________ Meat ____________________________ Fat ______________________________ Document Released: 12/04/2004 Document Revised: 06/01/2011 Document Reviewed: 10/15/2008 [employer] Patient Information 2014 [place]. Diabetes, Eating Away From Home Sometimes, you might eat in a restaurant or have meals that are prepared by someone else. You can enjoy eating out. However, the portions in restaurants may be much larger than needed. Listed below are some ideas to help you choose foods that will keep your blood glucose (sugar) in better control.  TIPS FOR EATING OUT  Know your meal plan and how many carbohydrate servings you should have at each meal. You may wish to carry a copy of your meal plan in your purse or wallet. Learn the foods included in each food group.  Make a list of restaurants near you that offer healthy choices. Take a copy of the carry-out menus to see what they offer. Then, you can plan what you will order ahead of time.  Become familiar with serving sizes by practicing them at home using measuring cups and spoons. Once you learn to recognize portion sizes, you will be able to correctly estimate the amount of total carbohydrate you are allowed to eat at the restaurant. Ask for a takeout box if the  portion is more than you  should have. When your food comes, leave the amount you should have on the plate, and put the rest in the takeout box before you start eating.  Plan ahead if your mealtime will be different from usual. Check with your caregiver to find out how to time meals and medicine if you are taking insulin.  Avoid high-fat foods, such as fried foods, cream sauces, high-fat salad dressings, or any added butter or margarine.  Do not be afraid to ask questions. Ask your server about the portion size, cooking methods, ingredients and if items can be substituted. Restaurants do not list all available items on the menu. You can ask for your main entree to be prepared using skim milk, oil instead of butter or margarine, and without gravy or sauces. Ask your waiter or waitress to serve salad dressings, gravy, sauces, margarine, and sour cream on the side. You can then add the amount your meal plan suggests.  Add more vegetables whenever possible.  Avoid items that are labeled "jumbo," "giant," "deluxe," or "supersized."  You may want to split an entre with someone and order an extra side salad.  Watch for hidden calories in foods like croutons, bacon, or cheese.  Ask your server to take away the bread basket or chips from your table.  Order a dinner salad as an appetizer. You can eat most foods served in a restaurant. Some foods are better choices than others. Breads and Starches  Recommended: All kinds of bread (wheat, rye, white, oatmeal, [place], [place], raisin), hard or soft dinner rolls, frankfurter or hamburger buns, small bagels, small corn or whole-wheat flour tortillas.  Avoid: Frosted or glazed breads, butter rolls, egg or cheese breads, croissants, sweet rolls, pastries, coffee cake, glazed or frosted doughnuts, muffins. Crackers  Recommended: Animal crackers, graham, rye, saltine, oyster, and matzoth crackers. Bread sticks, melba toast, rusks, pretzels, popcorn (without  fat), zwieback toast.  Avoid: High-fat snack crackers or chips. Buttered popcorn. Cereals  Recommended: Hot and cold cereals. Whole grains such as oatmeal or shredded wheat are good choices.  Avoid: Sugar-coated or granola type cereals. Potatoes/Pasta/Rice/Beans  Recommended: Order baked, boiled, or mashed potatoes, rice or noodles without added fat, whole beans. Order gravies, butter, margarine, or sauces on the side so you can control the amount you add.  Avoid: Hash browns or fried potatoes. Potatoes, pasta, or rice prepared with cream or cheese sauce. Potato or pasta salads prepared with large amounts of dressing. Fried beans or fried rice. Vegetables  Recommended: Order steamed, baked, boiled, or stewed vegetables without sauces or extra fat. Ask that sauce be served on the side. If vegetables are not listed on the menu, ask what is available.  Avoid: Vegetables prepared with cream, butter, or cheese sauce. Fried vegetables. Salad Bars  Recommended: Many of the vegetables at a salad bar are considered "free." Use lemon juice, vinegar, or low-calorie salad dressing (fewer than 20 calories per serving) as "free" dressings for your salad. Look for salad bar ingredients that have no added fat or sugar such as tomatoes, lettuce, cucumbers, broccoli, carrots, onions, and mushrooms.  Avoid: Prepared salads with large amounts of dressing, such as coleslaw, caesar salad, macaroni salad, bean salad, or carrot salad. Fruit  Recommended: Eat fresh fruit or fresh fruit salad without added dressing. A salad bar often offers fresh fruit choices, but canned fruit at a restaurant is usually packed in sugar or syrup.  Avoid: Sweetened canned or frozen fruits, plain or sweetened fruit juice. Fruit salads with dressing,  sour cream, or sugar added to them. Meat and Meat Substitutes  Recommended: Order broiled, baked, roasted, or grilled meat, poultry, or fish. Trim off all visible fat. Do not eat the  skin of poultry. The size stated on the menu is the raw weight. Meat shrinks by  in cooking (for example, 4 oz raw equals 3 oz cooked meat).  Avoid: Deep-fat fried meat, poultry, or fish. Breaded meats. Eggs  Recommended: Order soft, hard-cooked, poached, or scrambled eggs. Omelets may be okay, depending on what ingredients are added. Egg substitutes are also a good choice.  Avoid: Fried eggs, eggs prepared with cream or cheese sauce. Milk  Recommended: Order low-fat or fat-free milk according to your meal plan. Plain, nonfat yogurt or flavored yogurt with no sugar added may be used as a substitute for milk. Soy milk may also be used.  Avoid: Milk shakes or sweetened milk beverages. Soups and Combination Foods  Recommended: Clear broth or consomm are "free" foods and may be used as an appetizer. Broth-based soups with fat removed count as a starch serving and are preferred over cream soups. Soups made with beans or split peas may be eaten but count as a starch.  Avoid: Fatty soups, soup made with cream, cheese soup. Combination foods prepared with excessive amounts of fat or with cream or cheese sauces. Desserts and Sweets  Recommended: Ask for fresh fruit. Sponge or angel food cake without icing, ice milk, no sugar added ice cream, sherbet, or frozen yogurt may fit into your meal plan occasionally.  Avoid: Pastries, puddings, pies, cakes with icing, custard, gelatin desserts. Fats and Oils  Recommended: Choose healthy fats such as olive oil, canola oil, or tub margarine, reduced fat or fat-free sour cream, cream cheese, avocado, or nuts.  Avoid: Any fats in excess of your allowed portion. Deep-fried foods or any food with a large amount of fat. Note: Ask for all fats to be served on the side, and limit your portion sizes according to your meal plan. Document Released: 03/09/2005 Document Revised: 06/01/2011 Document Reviewed: 09/27/2008 [facility] Patient Information 2014 [place],  [place]. Diets for Diabetes, Food Labeling Look at food labels to help you decide how much of a product you can eat. You will want to check the amount of total carbohydrate in a serving to see how the food fits into your meal plan. In the list of ingredients, the ingredient present in the largest amount by weight must be listed first, followed by the other ingredients in descending order. STANDARD OF IDENTITY Most products have a list of ingredients. However, foods that the Food and Drug Administration (FDA) has given a standard of identity do not need a list of ingredients. A standard of identity means that a food must contain certain ingredients if it is called a particular name. Examples are mayonnaise, peanut butter, ketchup, jelly, and cheese. LABELING TERMS There are many terms found on food labels. Some of these terms have specific definitions. Some terms are regulated by the FDA, and the FDA has clearly specified how they can be used. Others are not regulated or well-defined and can be misleading and confusing. SPECIFICALLY DEFINED TERMS Nutritive Sweetener.  A sweetener that contains calories,such as table sugar or honey. Nonnutritive Sweetener.  A sweetener with few or no calories,such as saccharin, aspartame, sucralose, and cyclamate. LABELING TERMS REGULATED BY THE FDA Free.  The product contains only a tiny or small amount of fat, cholesterol, sodium, sugar, or calories. For example, a "fat-free" product will  contain less than 0.5 g of fat per serving. Low.  A food described as "low" in fat, saturated fat, cholesterol, sodium, or calories could be eaten fairly often without exceeding dietary guidelines. For example, "low in fat" means no more than 3 g of fat per serving. Lean.  "Lean" and "extra lean" are U.S. Department of Agriculture Architect) terms for use on meat and poultry products. "Lean" means the product contains less than 10 g of fat, 4 g of saturated fat, and 95 mg of  cholesterol per serving. "Lean" is not as low in fat as a product labeled "low." Extra Lean.  "Extra lean" means the product contains less than 5 g of fat, 2 g of saturated fat, and 95 mg of cholesterol per serving. While "extra lean" has less fat than "lean," it is still higher in fat than a product labeled "low." Reduced, Less, Fewer.  A diet product that contains 25% less of a nutrient or calories than the regular version. For example, hot dogs might be labeled "25% less fat than our regular hot dogs." Light/Lite.  A diet product that contains  fewer calories or  the fat of the original. For example, "light in sodium" means a product with  the usual sodium. More.  One serving contains at least 10% more of the daily value of a vitamin, mineral, or fiber than usual. Good Source Of.  One serving contains 10% to 19% of the daily value for a particular vitamin, mineral, or fiber. Excellent Source Of.  One serving contains 20% or more of the daily value for a particular nutrient. Other terms used might be "high in" or "rich in." Enriched or Fortified.  The product contains added vitamins, minerals, or protein. Nutrition labeling must be used on enriched or fortified foods. Imitation.  The product has been altered so that it is lower in protein, vitamins, or minerals than the usual food,such as imitation peanut butter. Total Fat.  The number listed is the total of all fat found in a serving of the product. Under total fat, food labels must list saturated fat and trans fat, which are associated with raising bad cholesterol and an increased risk of heart blood vessel disease. Saturated Fat.  Mainly fats from animal-based sources. Some examples are red meat, cheese, cream, whole milk, and coconut oil. Trans Fat.  Found in some fried snack foods, packaged foods, and fried restaurant foods. It is recommended you eat as close to 0 g of trans fat as possible, since it raises bad cholesterol  and lowers good cholesterol. Polyunsaturated and Monounsaturated Fats.  More healthful fats. These fats are from plant sources. Total Carbohydrate.  The number of carbohydrate grams in a serving of the product. Under total carbohydrate are listed the other carbohydrate sources, such as dietary fiber and sugars. Dietary Fiber.  A carbohydrate from plant sources. Sugars.  Sugars listed on the label contain all naturally occurring sugars as well as added sugars. LABELING TERMS NOT REGULATED BY THE FDA Sugarless.  Table sugar (sucrose) has not been added. However, the manufacturer may use another form of sugar in place of sucrose to sweeten the product. For example, sugar alcohols are used to sweeten foods. Sugar alcohols are a form of sugar but are not table sugar. If a product contains sugar alcohols in place of sucrose, it can still be labeled "sugarless." Low Salt, Salt-Free, Unsalted, No Salt, No Salt Added, Without Added Salt.  Food that is usually processed with salt has been made  without salt. However, the food may contain sodium-containing additives, such as preservatives, leavening agents, or flavorings. Natural.  This term has no legal meaning. Organic.  Foods that are certified as organic have been inspected and approved by the USDA to ensure they are produced without pesticides, fertilizers containing synthetic ingredients, bioengineering, or ionizing radiation. Document Released: 03/12/2003 Document Revised: 06/01/2011 Document Reviewed: 09/27/2008 [facility] Patient Information 2014 [place], [place].

## 2012-12-15 NOTE — Assessment & Plan Note (Signed)
Pt declines referral to diabetes nutrition and education

## 2012-12-15 NOTE — Assessment & Plan Note (Signed)
Lipid profile show good control Will recheck lipids today

## 2012-12-15 NOTE — Assessment & Plan Note (Signed)
Well controlled Will check CBC and BMET Continue current regimen for now

## 2012-12-15 NOTE — Assessment & Plan Note (Signed)
Foot exam today Will recheck A1C and microalbumin today May need to adjust mealtime insulin If no better- refer to endo Pt again declines referral to diabetes nutrition

## 2012-12-15 NOTE — Progress Notes (Signed)
HPI:  Pt presents to the clinic today for her welcome to medicare visit. She continues to have pain in her left knee. She has seen the orthopedist and needs an arthroscope but her A1C being 9.6% puts her a too high risk for infection. Her sugars continue to be labile. She is on mealtime insulin 18 units TID with meals. Sometimes she skips this. She is also on Levimer 65 units BID.  Flu: today Tetanus: 2008 Pneumovax: 2014 Zostovax: never Mammogram: never Pap Smear: no longer screening-partial hysterectomy Colonoscopy: 2009 Eye doctor: 10/2012 Dentist: 06/2012 Foot exam: today  Past Medical History  Diagnosis Date  . Chronic systolic heart failure   . Diabetes mellitus     type 1  . Hypertension   . Stroke 2010    eye doctor said she had TIA  . Ischemic cardiomyopathy   . Ventricular tachycardia     Polymorphic  . 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   . Atrial fibrillation     controlled with amiodarone, on coumadin  . Coronary artery disease 01/31/2011  . Chronic renal insufficiency   . Lumbar spondylosis 01/11/2012  . History of chicken pox   . Allergy   . Hyperlipidemia     Current Outpatient Prescriptions  Medication Sig Dispense Refill  . amiodarone (PACERONE) 200 MG tablet TAKE 1 TABLET (200 MG TOTAL) BY MOUTH DAILY.  60 tablet  6  . carvedilol (COREG) 25 MG tablet Take 1 tablet (25 mg total) by mouth 2 (two) times daily with a meal.  180 tablet  2  . cetirizine (ZYRTEC) 10 MG tablet Take 10 mg by mouth daily as needed for allergies.      . Cholecalciferol (VITAMIN D3) 2000 UNITS TABS Take 2,000 Units by mouth daily.       . diazepam (VALIUM) 5 MG tablet Take 0.5 tablets (2.5 mg total) by mouth 2 (two) times daily.  10 tablet  0  . diclofenac sodium (VOLTAREN) 1 % GEL Apply 2 g topically 4 (four) times daily.  2 Tube  2  . furosemide (LASIX) 20 MG tablet 1 tablet every other day  15 tablet  4  . gabapentin (NEURONTIN) 400 MG capsule Take 400 mg by mouth 2  (two) times daily.       . insulin glulisine (APIDRA) 100 UNIT/ML injection Inject 18 Units into the skin 3 (three) times daily before meals.  10 mL  12  . LEVEMIR FLEXPEN 100 UNIT/ML SOPN USE 65 UNITS TWICE A DAY  5 pen  3  . lisinopril (PRINIVIL,ZESTRIL) 5 MG tablet Take 1 tablet (5 mg total) by mouth daily.  90 tablet  2  . Multiple Vitamins-Minerals (EYE VITAMINS PO) Take by mouth daily.      . nitroGLYCERIN (NITROSTAT) 0.4 MG SL tablet Place 0.4 mg under the tongue every 5 (five) minutes as needed for chest pain.      . nizatidine (AXID) 150 MG capsule TAKE ONE CAPSULE BY MOUTH EVERY DAY FOR STOMACH  30 capsule  2  . omeprazole (PRILOSEC) 20 MG capsule TAKE ONE CAPSULE BY MOUTH EVERY DAY  30 capsule  5  . rosuvastatin (CRESTOR) 20 MG tablet TAKE 1 TABLET BY MOUTH EVERY DAY  30 tablet  1  . traMADol (ULTRAM) 50 MG tablet Take 1 tablet (50 mg total) by mouth 2 (two) times daily.  60 tablet  1  . traMADol-acetaminophen (ULTRACET) 37.5-325 MG per tablet       . Vitamin D, Ergocalciferol, (DRISDOL)  50000 UNITS CAPS TAKE ONE CAPSULE BY MOUTH ONCE A WEEK  4 capsule  7  . warfarin (COUMADIN) 2.5 MG tablet TAKE AS DIRECTED BY COUMADIN CLINIC  30 tablet  2   No current facility-administered medications for this visit.    Allergies  Allergen Reactions  . Darvon Other (See Comments)    indigestion  . Promethazine Hcl Other (See Comments)    hyperactivity  . Plavix [Clopidogrel Bisulfate] Rash    Family History  Problem Relation Age of Onset  . Diabetes Mother   . Heart disease Mother   . Hyperlipidemia Mother   . Hypertension Mother   . Heart disease Father   . Heart attack Father   . Hypertension Father   . Early death Brother 44    History   Social History  . Marital Status: Divorced    Spouse Name: N/A    Number of Children: 3  . Years of Education: 12   Occupational History  . Retried    Social History Main Topics  . Smoking status: Former Smoker    Quit date:  08/16/1995  . Smokeless tobacco: Never Used  . Alcohol Use: No  . Drug Use: No  . Sexual Activity: Not on file   Other Topics Concern  . Not on file   Social History Narrative   Regular exercise-no   Caffeine Use-no    Hospitiliaztions: None recently    Past Medical History  Diagnosis Date  . Chronic systolic heart failure   . Diabetes mellitus     type 1  . Hypertension   . Stroke 2010    eye doctor said she had TIA  . Ischemic cardiomyopathy   . Ventricular tachycardia     Polymorphic  . 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   . Atrial fibrillation     controlled with amiodarone, on coumadin  . Coronary artery disease 01/31/2011  . Chronic renal insufficiency   . Lumbar spondylosis 01/11/2012  . History of chicken pox   . Allergy   . Hyperlipidemia     Current Outpatient Prescriptions  Medication Sig Dispense Refill  . amiodarone (PACERONE) 200 MG tablet TAKE 1 TABLET (200 MG TOTAL) BY MOUTH DAILY.  60 tablet  6  . carvedilol (COREG) 25 MG tablet Take 1 tablet (25 mg total) by mouth 2 (two) times daily with a meal.  180 tablet  2  . cetirizine (ZYRTEC) 10 MG tablet Take 10 mg by mouth daily as needed for allergies.      . Cholecalciferol (VITAMIN D3) 2000 UNITS TABS Take 2,000 Units by mouth daily.       . diazepam (VALIUM) 5 MG tablet Take 0.5 tablets (2.5 mg total) by mouth 2 (two) times daily.  10 tablet  0  . diclofenac sodium (VOLTAREN) 1 % GEL Apply 2 g topically 4 (four) times daily.  2 Tube  2  . furosemide (LASIX) 20 MG tablet 1 tablet every other day  15 tablet  4  . gabapentin (NEURONTIN) 400 MG capsule Take 400 mg by mouth 2 (two) times daily.       . insulin glulisine (APIDRA) 100 UNIT/ML injection Inject 18 Units into the skin 3 (three) times daily before meals.  10 mL  12  . LEVEMIR FLEXPEN 100 UNIT/ML SOPN USE 65 UNITS TWICE A DAY  5 pen  3  . lisinopril (PRINIVIL,ZESTRIL) 5 MG tablet Take 1 tablet (5 mg total) by mouth daily.  90 tablet  2  . Multiple Vitamins-Minerals (EYE VITAMINS PO) Take by mouth daily.      . nitroGLYCERIN (NITROSTAT) 0.4 MG SL tablet Place 0.4 mg under the tongue every 5 (five) minutes as needed for chest pain.      . nizatidine (AXID) 150 MG capsule TAKE ONE CAPSULE BY MOUTH EVERY DAY FOR STOMACH  30 capsule  2  . omeprazole (PRILOSEC) 20 MG capsule TAKE ONE CAPSULE BY MOUTH EVERY DAY  30 capsule  5  . rosuvastatin (CRESTOR) 20 MG tablet TAKE 1 TABLET BY MOUTH EVERY DAY  30 tablet  1  . traMADol (ULTRAM) 50 MG tablet Take 1 tablet (50 mg total) by mouth 2 (two) times daily.  60 tablet  1  . traMADol-acetaminophen (ULTRACET) 37.5-325 MG per tablet       . Vitamin D, Ergocalciferol, (DRISDOL) 50000 UNITS CAPS TAKE ONE CAPSULE BY MOUTH ONCE A WEEK  4 capsule  7  . warfarin (COUMADIN) 2.5 MG tablet TAKE AS DIRECTED BY COUMADIN CLINIC  30 tablet  2   No current facility-administered medications for this visit.    Allergies  Allergen Reactions  . Darvon Other (See Comments)    indigestion  . Promethazine Hcl Other (See Comments)    hyperactivity  . Plavix [Clopidogrel Bisulfate] Rash    Family History  Problem Relation Age of Onset  . Diabetes Mother   . Heart disease Mother   . Hyperlipidemia Mother   . Hypertension Mother   . Heart disease Father   . Heart attack Father   . Hypertension Father   . Early death Brother 35    History   Social History  . Marital Status: Divorced    Spouse Name: N/A    Number of Children: 3  . Years of Education: 12   Occupational History  . Retried    Social History Main Topics  . Smoking status: Former Smoker    Quit date: 08/16/1995  . Smokeless tobacco: Never Used  . Alcohol Use: No  . Drug Use: No  . Sexual Activity: Not on file   Other Topics Concern  . Not on file   Social History Narrative   Regular exercise-no   Caffeine Use-no    I have personally reviewed and have noted:  1. The patient's medical and social history 2. Their  use of alcohol, tobacco or illicit drugs 3. Their current medications and supplements 4. The patient's functional ability including ADL's, fall risks, home safety risks and hearing or visual impairment. 5. Diet and physical activities 6. Evidence for depression or mood disorders  Subjective:   Review of Systems:   Constitutional: Denies fever, malaise, fatigue, headache or abrupt weight changes.  HEENT: Denies eye pain, eye redness, ear pain, ringing in the ears, wax buildup, runny nose, nasal congestion, bloody nose, or sore throat. Respiratory: Denies difficulty breathing, shortness of breath, cough or sputum production.   Cardiovascular: Denies chest pain, chest tightness, palpitations or swelling in the hands or feet.  Gastrointestinal: Denies abdominal pain, bloating, constipation, diarrhea or blood in the stool.  GU: Denies urgency, frequency, pain with urination, burning sensation, blood in urine, odor or discharge. Musculoskeletal: Pt reports left knee pain. Denies decrease in range of motion, difficulty with gait, muscle pain or joint pain and swelling.  Skin: Denies redness, rashes, lesions or ulcercations.  Neurological: Denies numbness or tingling in hands or feet, dizziness, difficulty with memory, difficulty with speech or problems with balance and coordination.   No other  specific complaints in a complete review of systems (except as listed in HPI above).  Objective:  PE:   BP 118/68  Pulse 80  Temp(Src) 98.6 F (37 C) (Oral)  Ht 5\' 3"  (1.6 m)  Wt 208 lb 8 oz (94.575 kg)  BMI 36.94 kg/m2  SpO2 95% Wt Readings from Last 3 Encounters:  12/15/12 208 lb 8 oz (94.575 kg)  11/10/12 210 lb (95.255 kg)  10/11/12 208 lb 12.8 oz (94.711 kg)    General: Appears her stated age, obese but well developed, well nourished in NAD. Skin: Warm, dry and intact. No rashes, lesions or ulcerations noted. HEENT: Head: normal shape and size; Eyes: sclera white, no icterus, conjunctiva  pink, PERRLA and EOMs intact; Ears: Tm's gray and intact, normal light reflex; Nose: mucosa pink and moist, septum midline; Throat/Mouth: Teeth present, mucosa pink and moist, no exudate, lesions or ulcerations noted.  Neck: Normal range of motion. Neck supple, trachea midline. No massses, lumps or thyromegaly present.  Cardiovascular: Normal rate and rhythm. S1,S2 noted.  No murmur, rubs or gallops noted. No JVD or BLE edema. No carotid bruits noted. ICD noted in left upper chest. Pulmonary/Chest: Normal effort and positive vesicular breath sounds. No respiratory distress. No wheezes, rales or ronchi noted.  Abdomen: Soft and nontender. Normal bowel sounds, no bruits noted. No distention or masses noted. Liver, spleen and kidneys non palpable. Musculoskeletal: Normal range of motion. 1+ swelling of the left knee. No difficulty with gait.  Neurological: Alert and oriented. Cranial nerves II-XII intact. Coordination normal. +DTRs bilaterally. Psychiatric: Mood and affect normal. Behavior is normal. Judgment and thought content normal.     BMET    Component Value Date/Time   NA 139 11/24/2012 1001   K 3.9 11/24/2012 1001   CL 108 11/24/2012 1001   CO2 25 11/24/2012 1001   GLUCOSE 247* 11/24/2012 1001   BUN 42* 11/24/2012 1001   CREATININE 1.7* 11/24/2012 1001   CALCIUM 8.8 11/24/2012 1001   GFRNONAA 54* 02/01/2011 0908   GFRAA 63* 02/01/2011 0908    Lipid Panel     Component Value Date/Time   CHOL 143 07/18/2012 1030   TRIG 70.0 07/18/2012 1030   HDL 57.70 07/18/2012 1030   CHOLHDL 2 07/18/2012 1030   VLDL 14.0 07/18/2012 1030   LDLCALC 71 07/18/2012 1030    CBC    Component Value Date/Time   WBC 5.8 09/02/2011 1220   RBC 4.15 09/02/2011 1220   HGB 12.1 09/02/2011 1220   HCT 36.7 09/02/2011 1220   PLT 224.0 09/02/2011 1220   MCV 88.3 09/02/2011 1220   MCH 28.1 01/30/2011 1543   MCHC 33.1 09/02/2011 1220   RDW 15.1* 09/02/2011 1220   LYMPHSABS 1.4 09/02/2011 1220   MONOABS 0.4 09/02/2011 1220   EOSABS  0.0 09/02/2011 1220   BASOSABS 0.0 09/02/2011 1220    Hgb A1C Lab Results  Component Value Date   HGBA1C 9.6* 07/18/2012      Assessment and Plan:   Medicare Annual Wellness Visit:  Diet: diabetic diet- handout provided Physical activity: Sedentary Depression/mood screen: Negative Hearing: Intact to whispered voice Visual acuity: Grossly normal, performs annual eye exam  ADLs: Capable Fall risk: None Home safety: Good Cognitive evaluation: Intact to orientation, naming, recall and repetition EOL planning: Adv directives, full code/ I agree  Preventative Medicine:  You need to get at least 1 pap smear You should also have a least 1 mammogram  Next appointment: 3 months

## 2012-12-15 NOTE — Assessment & Plan Note (Signed)
Needs arthroscopy by ortho If A1C less than 8 will medically clear for surgery

## 2012-12-16 ENCOUNTER — Other Ambulatory Visit: Payer: Self-pay | Admitting: Internal Medicine

## 2012-12-16 ENCOUNTER — Telehealth: Payer: Self-pay

## 2012-12-16 DIAGNOSIS — E109 Type 1 diabetes mellitus without complications: Secondary | ICD-10-CM

## 2012-12-16 LAB — MICROALBUMIN / CREATININE URINE RATIO
Microalb Creat Ratio: 1 mg/g (ref 0.0–30.0)
Microalb, Ur: 3.4 mg/dL — ABNORMAL HIGH (ref 0.0–1.9)

## 2012-12-16 NOTE — Telephone Encounter (Signed)
Message copied by Darnell Level on Fri Dec 16, 2012  1:53 PM ------      Message from: Lorre Munroe      Created: Fri Dec 16, 2012 12:57 PM       Please call pt and let her know her A1C is 9.1. I thing she would benefit from seeing an endocrinologist and discussing the possibility of an insulin pump. All other labs are stable. Let me know if she would like me to place the referral. ------

## 2012-12-16 NOTE — Telephone Encounter (Signed)
Referral placed.

## 2012-12-16 NOTE — Telephone Encounter (Signed)
Spoke with patient regarding her lab results. She states she is comfortable with you referring her to an endocrinologist.

## 2012-12-27 ENCOUNTER — Encounter: Payer: Self-pay | Admitting: Internal Medicine

## 2012-12-27 ENCOUNTER — Ambulatory Visit (INDEPENDENT_AMBULATORY_CARE_PROVIDER_SITE_OTHER): Payer: PRIVATE HEALTH INSURANCE | Admitting: *Deleted

## 2012-12-27 ENCOUNTER — Ambulatory Visit (INDEPENDENT_AMBULATORY_CARE_PROVIDER_SITE_OTHER): Payer: PRIVATE HEALTH INSURANCE | Admitting: Internal Medicine

## 2012-12-27 VITALS — BP 114/62 | HR 65 | Temp 97.9°F | Resp 12 | Ht 62.5 in | Wt 210.0 lb

## 2012-12-27 DIAGNOSIS — E1065 Type 1 diabetes mellitus with hyperglycemia: Secondary | ICD-10-CM

## 2012-12-27 DIAGNOSIS — I4891 Unspecified atrial fibrillation: Secondary | ICD-10-CM

## 2012-12-27 DIAGNOSIS — IMO0002 Reserved for concepts with insufficient information to code with codable children: Secondary | ICD-10-CM

## 2012-12-27 DIAGNOSIS — Z7901 Long term (current) use of anticoagulants: Secondary | ICD-10-CM

## 2012-12-27 MED ORDER — INSULIN DETEMIR 100 UNIT/ML FLEXPEN: PEN_INJECTOR | SUBCUTANEOUS | Status: DC

## 2012-12-27 NOTE — Progress Notes (Signed)
Patient ID: [name], female   DOB: Mar 19, 1946, 67 y.o.   MRN: [id number]  HPI: [name] is a 67 y.o.-year-old female, referred by her PCP, Nicki Reaper, for management of DM2, insulin-dependent, uncontrolled, with complications ( CAD, ICM - had ICD, peripheral neuropathy, gastroparesis).  Patient has been diagnosed with diabetes years back; now on insulin. Last hemoglobin A1c was: Lab Results  Component Value Date   HGBA1C 9.1* 12/15/2012  Prev. 9.6%.  Pt is on a regimen of: - Levemir 65 bid - pen  - Apidra 18 units tid ac - vial - used to be on a SSI, not now  Pt checks her sugars 3-4 a day and they are: - am: low 100-low 200s - before lunch: 180s - before dinner: 90s-150 - bedtime: 100-150 Has lows few times a week. Lowest sugar was 50s; she has hypoglycemia awareness at 70. Highest sugar was 200s  Pt's meals are: - Breakfast: small bowl of cereal frosted with 2% - Lunch: vegetable + starch; turkey/ham sandwich - Dinner: fast food or cooks: meat + vegetables + sometimes potatoes - Snacks: fruit (apple)  - Has CKD, last BUN/creatinine:  Lab Results  Component Value Date   BUN 36* 12/15/2012   CREATININE 1.8* 12/15/2012  ACR in 12/15/2012: 1.0.  - last set of lipids: Lab Results  Component Value Date   CHOL 154 12/15/2012   HDL 54.10 12/15/2012   LDLCALC 75 12/15/2012   TRIG 127.0 12/15/2012   CHOLHDL 3 12/15/2012  - last eye exam was in 10/2012. No DR.  - + numbness and tingling in her feet - on neurontin  She is supposed to have left knee arthroscopy, but needs to get HbA1c <8%.  Pt has FH of DM in mother, brother.   ROS: Constitutional: + weight gain, no fatigue, no subjective hyperthermia/hypothermia Eyes: + blurry vision, no xerophthalmia ENT: no sore throat, no nodules palpated in throat, no dysphagia/odynophagia, no hoarseness; + tinnitus Cardiovascular: no CP/SOB/palpitations/+ leg swelling Respiratory: no cough/SOB Gastrointestinal: no  N/V/D/C Musculoskeletal: no muscle/+ joint aches Skin: no rashes Neurological: no tremors/numbness/tingling/dizziness Psychiatric: no depression/anxiety  Past Medical History  Diagnosis Date  . Chronic systolic heart failure   . Diabetes mellitus     type 1  . Hypertension   . Stroke 2010    eye doctor said she had TIA  . Ischemic cardiomyopathy   . Ventricular tachycardia     Polymorphic  . 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   . Atrial fibrillation     controlled with amiodarone, on coumadin  . Coronary artery disease 01/31/2011  . Chronic renal insufficiency   . Lumbar spondylosis 01/11/2012  . History of chicken pox   . Allergy   . Hyperlipidemia    Past Surgical History  Procedure Laterality Date  . Abdominal hysterectomy  1995  . Cholecystectomy  1985  . Tubaligation  1980  . Icd  2003/2007    implanted by Dr Alanda Amass, most recent generator change 2/13 by Dr Johney Frame, Analyze ST study patient   History   Social History  . Marital Status: Divorced    Spouse Name: N/A    Number of Children: 3  . Years of Education: 12   Occupational History  . Retired    Social History Main Topics  . Smoking status: Former Smoker    Quit date: 08/16/1995  . Smokeless tobacco: Never Used  . Alcohol Use: No  . Drug Use: No  . Sexual Activity: No  Social History Narrative   Regular exercise-no   Caffeine Use-no   Current Outpatient Prescriptions on File Prior to Visit  Medication Sig Dispense Refill  . amiodarone (PACERONE) 200 MG tablet TAKE 1 TABLET (200 MG TOTAL) BY MOUTH DAILY.  60 tablet  6  . carvedilol (COREG) 25 MG tablet Take 1 tablet (25 mg total) by mouth 2 (two) times daily with a meal.  180 tablet  2  . cetirizine (ZYRTEC) 10 MG tablet Take 10 mg by mouth daily as needed for allergies.      . Cholecalciferol (VITAMIN D3) 2000 UNITS TABS Take 2,000 Units by mouth daily.       . diazepam (VALIUM) 5 MG tablet Take 0.5 tablets (2.5 mg total) by  mouth 2 (two) times daily.  10 tablet  0  . diclofenac sodium (VOLTAREN) 1 % GEL Apply 2 g topically 4 (four) times daily.  2 Tube  2  . furosemide (LASIX) 20 MG tablet 1 tablet every other day  15 tablet  4  . gabapentin (NEURONTIN) 400 MG capsule Take 400 mg by mouth 2 (two) times daily.       . insulin glulisine (APIDRA) 100 UNIT/ML injection Inject 18 Units into the skin 3 (three) times daily before meals.  10 mL  12  . lisinopril (PRINIVIL,ZESTRIL) 5 MG tablet Take 1 tablet (5 mg total) by mouth daily.  90 tablet  2  . Multiple Vitamins-Minerals (EYE VITAMINS PO) Take by mouth daily.      . nitroGLYCERIN (NITROSTAT) 0.4 MG SL tablet Place 0.4 mg under the tongue every 5 (five) minutes as needed for chest pain.      . nizatidine (AXID) 150 MG capsule TAKE ONE CAPSULE BY MOUTH EVERY DAY FOR STOMACH  30 capsule  2  . omeprazole (PRILOSEC) 20 MG capsule TAKE ONE CAPSULE BY MOUTH EVERY DAY  30 capsule  5  . rosuvastatin (CRESTOR) 20 MG tablet TAKE 1 TABLET BY MOUTH EVERY DAY  30 tablet  1  . traMADol (ULTRAM) 50 MG tablet Take 1 tablet (50 mg total) by mouth 2 (two) times daily.  60 tablet  1  . traMADol-acetaminophen (ULTRACET) 37.5-325 MG per tablet       . Vitamin D, Ergocalciferol, (DRISDOL) 50000 UNITS CAPS TAKE ONE CAPSULE BY MOUTH ONCE A WEEK  4 capsule  7  . warfarin (COUMADIN) 2.5 MG tablet TAKE AS DIRECTED BY COUMADIN CLINIC  30 tablet  2   No current facility-administered medications on file prior to visit.   Allergies  Allergen Reactions  . Darvon Other (See Comments)    indigestion  . Promethazine Hcl Other (See Comments)    hyperactivity  . Plavix [Clopidogrel Bisulfate] Rash   Family History  Problem Relation Age of Onset  . Diabetes Mother   . Heart disease Mother   . Hyperlipidemia Mother   . Hypertension Mother   . Heart disease Father   . Heart attack Father   . Hypertension Father   . Early death Brother 42   PE: BP 114/62  Pulse 65  Temp(Src) 97.9 F (36.6  C) (Oral)  Resp 12  Ht 5' 2.5" (1.588 m)  Wt 210 lb (95.255 kg)  BMI 37.77 kg/m2  SpO2 96% Wt Readings from Last 3 Encounters:  12/27/12 210 lb (95.255 kg)  12/15/12 208 lb 8 oz (94.575 kg)  11/10/12 210 lb (95.255 kg)   Constitutional: overweight, in NAD Eyes: PERRLA, EOMI, no exophthalmos ENT: moist mucous membranes, no  thyromegaly, no cervical lymphadenopathy Cardiovascular: RRR, No MRG Respiratory: CTA B Gastrointestinal: abdomen soft, NT, ND, BS+ Musculoskeletal: no deformities, strength intact in all 4 Skin: moist, warm, no rashes Neurological: no tremor with outstretched hands, DTR normal in all 4  ASSESSMENT: 1. DM2, insulin-dependent, uncontrolled, with complications - CAD, ICM - s/p ICD placement - CKD - PN - on neurontin - gastroparesis per [facility] 06/18/2012 >> 60 minutes: 92%, 120 minutes: 82%  PLAN:  1. Patient with long-standing, recently more uncontrolled diabetes, basal bolus regimen, with a high dose of basal insulin compared to bolus. Patient has variable sugars throughout the day, with increased variability specially in the morning, which can be a sign of to much basal insulin. She also has frequent lows between meals, again, a sign of to much basal insulin. She does not have a good diet, and we discussed about possible ways to improve it. She accepts a referral to nutrition. - per notes from PCP, room in insulin pump was recommended for better control, however, after we discussed about it, the patient does not think that she would want to get on the pump. I did offer to refer her to diabetes education for more information about the pump, however she refuses.  - We discussed about options for treatment, and I suggested to:  Please decrease the Levemir to 50 units in am and 45 units before bedtime. Continue Apidra 18 units before a meal, but add the following sliding scale: - 150- 165: + 1 unit  - 166- 180: + 2 units  - 181- 195: + 3 units  - 196- 210: + 4 units   - 211- 225: + 5 units  - 226- 240: + 6 units  - >240: + 7 units When injecting insulin:  Inject in the abdomen  Rotate the injection sites around the belly button  Change needle for each injection  Keep needle in for 10 sec after last unit of insulin in Please return in 1 month with your sugar log.  You will be called with the nutrition appointment. - Strongly advised her to start checking her sugars at different times of the day - check 4 times a day, rotating checks - given sugar log and advised how to fill it and to bring it at next appt  - given foot care handout and explained the principles  - given instructions for hypoglycemia management "15-15 rule"  - advised for yearly eye exams - already had the flu vaccine this fall - Return to clinic in one month with sugar log   Patient complained of sweating and sensation of being hypoglycemic, a CBG checked in the office was 262. This was checked after she took 2 glucose tablets.

## 2012-12-27 NOTE — Patient Instructions (Addendum)
Please decrease the Levemir to 50 units in am and 45 units before bedtime. Continue Apidra 18 units before a meal, but add the following sliding scale: - 150- 165: + 1 unit  - 166- 180: + 2 units  - 181- 195: + 3 units  - 196- 210: + 4 units  - 211- 225: + 5 units  - 226- 240: + 6 units  - >240: + 7 units When injecting insulin:  Inject in the abdomen  Rotate the injection sites around the belly button  Change needle for each injection  Keep needle in for 10 sec after last unit of insulin in Please return in 1 month with your sugar log.  You will be called with the nutrition appointment.  PATIENT INSTRUCTIONS FOR TYPE 2 DIABETES:  DIET AND EXERCISE Diet and exercise is an important part of diabetic treatment.  We recommended aerobic exercise in the form of brisk walking (working between 40-60% of maximal aerobic capacity, similar to brisk walking) for 150 minutes per week (such as 30 minutes five days per week) along with 3 times per week performing 'resistance' training (using various gauge rubber tubes with handles) 5-10 exercises involving the major muscle groups (upper body, lower body and core) performing 10-15 repetitions (or near fatigue) each exercise. Start at half the above goal but build slowly to reach the above goals. If limited by weight, joint pain, or disability, we recommend daily walking in a swimming pool with water up to waist to reduce pressure from joints while allow for adequate exercise.    BLOOD GLUCOSES Monitoring your blood glucoses is important for continued management of your diabetes. Please check your blood glucoses 2-4 times a day: fasting, before meals and at bedtime (you can rotate these measurements - e.g. one day check before the 3 meals, the next day check before 2 of the meals and before bedtime, etc.   HYPOGLYCEMIA (low blood sugar) Hypoglycemia is usually a reaction to not eating, exercising, or taking too much insulin/ other diabetes drugs.   Symptoms include tremors, sweating, hunger, confusion, headache, etc. Treat IMMEDIATELY with 15 grams of Carbs:   4 glucose tablets    cup regular juice/soda   2 tablespoons raisins   4 teaspoons sugar   1 tablespoon honey Recheck blood glucose in 15 mins and repeat above if still symptomatic/blood glucose <100. Please contact our office at [phone] if you have questions about how to next handle your insulin.  RECOMMENDATIONS TO REDUCE YOUR RISK OF DIABETIC COMPLICATIONS: * Take your prescribed MEDICATION(S). * Follow a DIABETIC diet: Complex carbs, fiber rich foods, heart healthy fish twice weekly, (monounsaturated and polyunsaturated) fats * AVOID saturated/trans fats, high fat foods, >2,300 mg salt per day. * EXERCISE at least 5 times a week for 30 minutes or preferably daily.  * DO NOT SMOKE OR DRINK more than 1 drink a day. * Check your FEET every day. Do not wear tightfitting shoes. Contact us if you develop an ulcer * See your EYE doctor once a year or more if needed * Get a FLU shot once a year * Get a PNEUMONIA vaccine once before and once after age 19 years  GOALS:  * Your Hemoglobin A1c of <7%  * fasting sugars need to be <130 * after meals sugars need to be <180 (2h after you start eating) * Your Systolic BP should be 140 or lower  * Your Diastolic BP should be 80 or lower  * Your HDL (Good Cholesterol)  should be 40 or higher  * Your LDL (Bad Cholesterol) should be 100 or lower  * Your Triglycerides should be 150 or lower  * Your Urine microalbumin (kidney function) should be <30 * Your Body Mass Index should be 25 or lower   We will be glad to help you achieve these goals. Our telephone number is: [phone].

## 2012-12-29 ENCOUNTER — Telehealth: Payer: Self-pay | Admitting: Internal Medicine

## 2012-12-29 NOTE — Telephone Encounter (Signed)
Nurse with CHF program at [facility] calling re status of fax she sent 12-06-12 to verify pt's diagnosis, she is faxing another today, pls call

## 2012-12-29 NOTE — Telephone Encounter (Signed)
Called and let nurse know it needs to be sent to Dr Shirlee Latch

## 2013-01-11 ENCOUNTER — Ambulatory Visit (INDEPENDENT_AMBULATORY_CARE_PROVIDER_SITE_OTHER): Payer: PRIVATE HEALTH INSURANCE | Admitting: *Deleted

## 2013-01-11 DIAGNOSIS — Z7901 Long term (current) use of anticoagulants: Secondary | ICD-10-CM

## 2013-01-11 DIAGNOSIS — I4891 Unspecified atrial fibrillation: Secondary | ICD-10-CM

## 2013-01-11 LAB — POCT INR: INR: 2.9

## 2013-01-20 ENCOUNTER — Other Ambulatory Visit: Payer: Self-pay | Admitting: *Deleted

## 2013-01-23 ENCOUNTER — Other Ambulatory Visit: Payer: Self-pay

## 2013-01-23 ENCOUNTER — Other Ambulatory Visit: Payer: Self-pay | Admitting: Internal Medicine

## 2013-01-23 NOTE — Telephone Encounter (Signed)
[facility] called requesting tramadol refill.  Advised them patient would need to be seen for refill.

## 2013-01-25 ENCOUNTER — Ambulatory Visit (INDEPENDENT_AMBULATORY_CARE_PROVIDER_SITE_OTHER): Payer: PRIVATE HEALTH INSURANCE | Admitting: *Deleted

## 2013-01-25 DIAGNOSIS — Z7901 Long term (current) use of anticoagulants: Secondary | ICD-10-CM

## 2013-01-25 DIAGNOSIS — I4891 Unspecified atrial fibrillation: Secondary | ICD-10-CM

## 2013-01-30 ENCOUNTER — Encounter: Payer: Self-pay | Admitting: Internal Medicine

## 2013-01-31 ENCOUNTER — Encounter: Payer: Self-pay | Admitting: Internal Medicine

## 2013-01-31 ENCOUNTER — Ambulatory Visit (INDEPENDENT_AMBULATORY_CARE_PROVIDER_SITE_OTHER): Payer: PRIVATE HEALTH INSURANCE | Admitting: Internal Medicine

## 2013-01-31 VITALS — BP 124/68 | HR 94 | Temp 98.2°F | Resp 10 | Wt 210.7 lb

## 2013-01-31 DIAGNOSIS — E1065 Type 1 diabetes mellitus with hyperglycemia: Secondary | ICD-10-CM

## 2013-01-31 DIAGNOSIS — IMO0002 Reserved for concepts with insufficient information to code with codable children: Secondary | ICD-10-CM

## 2013-01-31 NOTE — Progress Notes (Signed)
Patient ID: [name], female   DOB: Jun 28, 1945, 67 y.o.   MRN: [id number]  HPI: [name] is a 67 y.o.-year-old female, returning for f/u for DM2, insulin-dependent, uncontrolled, with complications (CAD, ICM - had ICD, peripheral neuropathy, gastroparesis).  Last hemoglobin A1c was: Lab Results  Component Value Date   HGBA1C 9.1* 12/15/2012   HGBA1C 9.6* 07/18/2012   HGBA1C 7.6* 01/14/2011   Pt is on a regimen of: - Levemir 50 units in am and 45 units in hs bid - pen  - Apidra 18 units tid ac 5 min before a meal - vial - + added SSI at last visit target 150, ISF 15  Pt checks her sugars 3-4 a day and they are: - am: low 100-low 200s >> 148-234 - before lunch: 180s >> 68-200 - after lunch: 55-171 - before dinner: 90s-150 >> 61-188 - bedtime: 100-150 >>112-279  Lowest sugar was 55 and 57; she has hypoglycemia awareness at 70. Highest sugar was 200s  Pt's meals are: - Breakfast: small bowl of cereal frosted with 2% - Lunch: vegetable + starch; turkey/ham sandwich - Dinner: fast food or cooks: meat + vegetables + sometimes potatoes - Snacks: fruit (apple) Did not see nutrition yet.  - Has CKD, last BUN/creatinine:  Lab Results  Component Value Date   BUN 36* 12/15/2012   CREATININE 1.8* 12/15/2012  ACR in 12/15/2012: 1.0.  - last set of lipids: Lab Results  Component Value Date   CHOL 154 12/15/2012   HDL 54.10 12/15/2012   LDLCALC 75 12/15/2012   TRIG 127.0 12/15/2012   CHOLHDL 3 12/15/2012  - last eye exam was in 10/2012. No DR.  - + numbness and tingling in her feet - on neurontin  She is supposed to have left knee arthroscopy, but needs to get HbA1c <8%.   ROS: Constitutional: + weight gain, no fatigue, no subjective hyperthermia/hypothermia Eyes: + blurry vision, no xerophthalmia ENT: no sore throat, no nodules palpated in throat, no dysphagia/odynophagia, no hoarseness; + tinnitus Cardiovascular: no CP/SOB/palpitations/+ leg swelling Respiratory: no  cough/SOB Gastrointestinal: no N/+V/no D/C Musculoskeletal: no muscle/+ joint aches Skin: no rashes, + excessive hair growth Neurological: no tremors/numbness/tingling/dizziness Psychiatric: no depression/anxiety  PE: BP 124/68  Pulse 94  Temp(Src) 98.2 F (36.8 C) (Oral)  Resp 10  Wt 210 lb 11.2 oz (95.573 kg)  SpO2 95% Wt Readings from Last 3 Encounters:  01/31/13 210 lb 11.2 oz (95.573 kg)  12/27/12 210 lb (95.255 kg)  12/15/12 208 lb 8 oz (94.575 kg)   Constitutional: overweight, in NAD Eyes: PERRLA, EOMI, no exophthalmos ENT: moist mucous membranes, no thyromegaly, no cervical lymphadenopathy Cardiovascular: RRR, No MRG Respiratory: CTA B Gastrointestinal: abdomen soft, NT, ND, BS+ Musculoskeletal: no deformities, strength intact in all 4 Skin: moist, warm, no rashes Neurological: no tremor with outstretched hands, DTR normal in all 4  ASSESSMENT: 1. DM2, insulin-dependent, uncontrolled, with complications - CAD, ICM - s/p ICD placement - CKD - PN - on neurontin - gastroparesis per GES 06/18/2012 >> 60 minutes: 92%, 120 minutes: 82%  PLAN:  1. Patient with long-standing, recently more uncontrolled diabetes, on basal bolus regimen, with still higher sugars in am and at bedtime and improved sugars during the day (except right after lunch when she can drop to 55). - she did not see nutrition yet - We discussed about options for treatment, and I suggested to:  ContinueLevemir to 50 units in am and 45 units before bedtime. Continue Apidra: 14 units for  a smaller meal  16 units for a medium meal 20 units before a large meal or dinner - may need to increase to 22 Continue following sliding scale: - 150- 165: + 1 unit  - 166- 180: + 2 units  - 181- 195: + 3 units  - 196- 210: + 4 units  - >210: + 5 units  - because of the gastroparesis, I advised her to take the insulin ad]fter she d\starts eating (now she takes it 5 min before) to avoid hypoglycemia - continue  checking her sugars at different times of the day - check 4 times a day, rotating checks - up to date with eye exams - had the flu vaccine this fall - will check a HbA1c at next visit - Return to clinic in one month with sugar log

## 2013-01-31 NOTE — Patient Instructions (Addendum)
ContinueLevemir to 50 units in am and 45 units before bedtime. Continue Apidra: 14 units for a smaller meal  16 units for a medium meal 20 units before a large meal or dinner  - may need to increase to 22 if sugars at bedtime are still >180 Continue following sliding scale: - 150- 165: + 1 unit  - 166- 180: + 2 units  - 181- 195: + 3 units  - 196- 210: + 4 units  - >210: + 5 units   Please return in 1 month with your sugar log.

## 2013-02-08 ENCOUNTER — Ambulatory Visit (INDEPENDENT_AMBULATORY_CARE_PROVIDER_SITE_OTHER): Payer: PRIVATE HEALTH INSURANCE | Admitting: *Deleted

## 2013-02-08 DIAGNOSIS — I4891 Unspecified atrial fibrillation: Secondary | ICD-10-CM

## 2013-02-08 DIAGNOSIS — Z7901 Long term (current) use of anticoagulants: Secondary | ICD-10-CM

## 2013-02-08 LAB — POCT INR: INR: 3.5

## 2013-02-11 ENCOUNTER — Other Ambulatory Visit: Payer: Self-pay | Admitting: Cardiology

## 2013-02-13 ENCOUNTER — Other Ambulatory Visit: Payer: Self-pay | Admitting: Internal Medicine

## 2013-02-13 ENCOUNTER — Ambulatory Visit (INDEPENDENT_AMBULATORY_CARE_PROVIDER_SITE_OTHER): Payer: PRIVATE HEALTH INSURANCE | Admitting: *Deleted

## 2013-02-13 ENCOUNTER — Encounter: Payer: Self-pay | Admitting: Internal Medicine

## 2013-02-13 DIAGNOSIS — I472 Ventricular tachycardia: Secondary | ICD-10-CM

## 2013-02-13 DIAGNOSIS — I4891 Unspecified atrial fibrillation: Secondary | ICD-10-CM

## 2013-02-13 DIAGNOSIS — I4729 Other ventricular tachycardia: Secondary | ICD-10-CM

## 2013-02-19 LAB — MDC_IDC_ENUM_SESS_TYPE_REMOTE
Battery Remaining Longevity: 69 mo
Brady Statistic RA Percent Paced: 98 %
Brady Statistic RV Percent Paced: 2.1 %
HighPow Impedance: 45 Ohm
Implantable Pulse Generator Model: 2241
Implantable Pulse Generator Serial Number: 819806
Lead Channel Impedance Value: 450 Ohm
Lead Channel Pacing Threshold Amplitude: 0.625 V
Lead Channel Pacing Threshold Amplitude: 1 V
Lead Channel Sensing Intrinsic Amplitude: 11.8 mV
Lead Channel Sensing Intrinsic Amplitude: 2.9 mV
Lead Channel Setting Pacing Amplitude: 2.5 V
Lead Channel Setting Sensing Sensitivity: 0.5 mV
Zone Setting Detection Interval: 340 ms

## 2013-02-23 ENCOUNTER — Ambulatory Visit (INDEPENDENT_AMBULATORY_CARE_PROVIDER_SITE_OTHER): Payer: PRIVATE HEALTH INSURANCE | Admitting: General Practice

## 2013-02-23 DIAGNOSIS — I4891 Unspecified atrial fibrillation: Secondary | ICD-10-CM

## 2013-02-23 DIAGNOSIS — Z7901 Long term (current) use of anticoagulants: Secondary | ICD-10-CM

## 2013-02-23 LAB — POCT INR: INR: 2.2

## 2013-02-24 ENCOUNTER — Other Ambulatory Visit: Payer: Self-pay | Admitting: Cardiology

## 2013-02-24 ENCOUNTER — Other Ambulatory Visit: Payer: Self-pay | Admitting: Internal Medicine

## 2013-02-28 ENCOUNTER — Encounter: Payer: Self-pay | Admitting: *Deleted

## 2013-03-02 ENCOUNTER — Ambulatory Visit (INDEPENDENT_AMBULATORY_CARE_PROVIDER_SITE_OTHER): Payer: PRIVATE HEALTH INSURANCE | Admitting: Internal Medicine

## 2013-03-02 ENCOUNTER — Encounter: Payer: Self-pay | Admitting: Internal Medicine

## 2013-03-02 VITALS — BP 114/62 | HR 81 | Temp 97.8°F | Ht 62.5 in | Wt 210.0 lb

## 2013-03-02 DIAGNOSIS — E1065 Type 1 diabetes mellitus with hyperglycemia: Secondary | ICD-10-CM

## 2013-03-02 DIAGNOSIS — IMO0002 Reserved for concepts with insufficient information to code with codable children: Secondary | ICD-10-CM

## 2013-03-02 NOTE — Progress Notes (Signed)
Patient ID: [name], female   DOB: [birth year], 67 y.o.   MRN: [id number]  HPI: [name] is a 67 y.o.-year-old female, returning for f/u for DM2, insulin-dependent, uncontrolled, with complications (CAD, ICM - had ICD, peripheral neuropathy, gastroparesis).  Last hemoglobin A1c was: Lab Results  Component Value Date   HGBA1C 9.1* 12/15/2012   HGBA1C 9.6* 07/18/2012   HGBA1C 7.6* 01/14/2011   Pt is on a regimen of: - Levemir 50 units in am and 45 units in hs bid - pen  - Apidra 14-16-20 (depending on size of meal) << 18 units tid ac 5 min before a meal - vial Upon questioning, THE PATIENT IS NOT TAKING THE MEALTIME INSULIN CORRECTLY, ADJUSTING THE DOSE ABOVE BASED ON PRE-MEAL SUGARS - + added SSI at last visit target 150, ISF 15  Pt checks her sugars 3-4 a day and they are: - am: low 100-low 200s >> 148-234 >> 170-268 - before lunch: 180s >> 68-200 >> 68-330 (130-175) - after lunch: 55-171 >> n/c - before dinner: 90s-150 >> 61-188 >> 69-277 (120-180) - bedtime: 100-150 >>112-279 >> 92-249 Lowest sugar was 68, 69; she has hypoglycemia awareness at 70. Highest sugar was 544.  Pt's meals are: - Breakfast: small bowl of cereal frosted with 2%  - Lunch: vegetable + starch; turkey/ham sandwich - Dinner: fast food or cooks: meat + vegetables + sometimes potatoes - Snacks: fruit (apple) Did not see nutrition yet.  - Has CKD, last BUN/creatinine:  Lab Results  Component Value Date   BUN 36* 12/15/2012   CREATININE 1.8* 12/15/2012  ACR in 12/15/2012: 1.0.  - last set of lipids: Lab Results  Component Value Date   CHOL 154 12/15/2012   HDL 54.10 12/15/2012   LDLCALC 75 12/15/2012   TRIG 127.0 12/15/2012   CHOLHDL 3 12/15/2012  she is on Crestor. - last eye exam was in 10/2012. No DR.  - + numbness and tingling in her feet - on neurontin  She is supposed to have left knee arthroscopy, but needs to get HbA1c <8%.   I reviewed pt's medications, allergies, PMH, social hx,  family hx and no changes required, except as mentioned above.  ROS: Constitutional: + weight gain, no fatigue, no subjective hyperthermia/hypothermia Eyes: + blurry vision, no xerophthalmia ENT: no sore throat, no nodules palpated in throat, no dysphagia/odynophagia, no hoarseness; + tinnitus Cardiovascular: no CP/SOB/palpitations/+ leg swelling Respiratory: no cough/SOB Gastrointestinal: no N/+V/no D/C Musculoskeletal: no muscle/+ joint aches Skin: no rashes, + excessive hair growth Neurological: no tremors/numbness/tingling/dizziness  PE: BP 114/62  Pulse 81  Temp(Src) 97.8 F (36.6 C) (Oral)  Ht 5' 2.5" (1.588 m)  Wt 210 lb (95.255 kg)  BMI 37.77 kg/m2  SpO2 94% Wt Readings from Last 3 Encounters:  03/02/13 210 lb (95.255 kg)  01/31/13 210 lb 11.2 oz (95.573 kg)  12/27/12 210 lb (95.255 kg)   Constitutional: overweight, in NAD, full supraclavicular fat pads Eyes: PERRLA, EOMI, no exophthalmos ENT: moist mucous membranes, no thyromegaly, no cervical lymphadenopathy Cardiovascular: RRR, No MRG Respiratory: CTA B Gastrointestinal: abdomen soft, NT, ND, BS+ Musculoskeletal: no deformities, strength intact in all 4 Skin: moist, warm, no rashes Neurological: no tremor with outstretched hands, DTR normal in all 4  ASSESSMENT: 1. DM2, insulin-dependent, uncontrolled, with complications - CAD, ICM - s/p ICD placement - CKD - PN - on neurontin - gastroparesis per GES 06/18/2012 >> 60 minutes: 92%, 120 minutes: 82%  PLAN:  1. Patient with long-standing, uncontrolled diabetes, on basal  bolus regimen, with still higher sugars in am and improved sugars during the day. - she did not see nutrition yet - We discussed about options for treatment, and I suggested to:  Patient Instructions  Continue Levemir to 50 units in am and increase to 50 units before bedtime. Continue Apidra: 14 units for a smaller meal  16 units for a medium meal 20 units before a large meal or dinner - may  need to increase to 22 Continue following sliding scale: - 150- 165: + 1 unit  - 166- 180: + 2 units  - 181- 195: + 3 units  - 196- 210: + 4 units  - >210: + 5 units  - because of the gastroparesis, please inject the Apidra after you start eating  - Please stop at the lab - Please return in 1 month with your sugar log.  - we discussed about the possibility of adding Invokana, which would be an attractive way to decrease her insulin requirement and help her lose some weight, however, this is contraindicated in patients with GFR less than 45, and her latest GFR is 30. For the same reason, we cannot use metformin in her, which could also help. - WE DISCUSSED ABOUT TAKING THE MEALTIME INSULIN AND A SLIDING SCALE CORRECTLY - continue checking her sugars at different times of the day - check 4 times a day, rotating checks - up to date with eye exams - had the flu vaccine this fall - will check a HbA1c AT NEXT VISIT - Return to clinic in 1 month with sugar log

## 2013-03-02 NOTE — Patient Instructions (Signed)
Continue Levemir to 50 units in am and increase to 50 units before bedtime. Continue Apidra: 14 units for a smaller meal  16 units for a medium meal 20 units before a large meal or dinner - may need to increase to 22 Continue following sliding scale: - 150- 165: + 1 unit  - 166- 180: + 2 units  - 181- 195: + 3 units  - 196- 210: + 4 units  - >210: + 5 units  - because of the gastroparesis, please inject the Apidra after you start eating  - Please stop at the lab - Please return in 1 month with your sugar log.

## 2013-03-13 ENCOUNTER — Other Ambulatory Visit: Payer: Self-pay | Admitting: *Deleted

## 2013-03-13 MED ORDER — GLUCOSE BLOOD VI STRP: ORAL_STRIP | Status: DC

## 2013-03-13 MED ORDER — ACCU-CHEK NANO SMARTVIEW W/DEVICE KIT: PACK | Status: DC

## 2013-03-13 NOTE — Telephone Encounter (Signed)
Pt called requesting a new meter and test strips; due to her insurance. Advised pt we would order today.

## 2013-03-20 ENCOUNTER — Encounter: Payer: Self-pay | Admitting: Cardiology

## 2013-03-20 ENCOUNTER — Ambulatory Visit (INDEPENDENT_AMBULATORY_CARE_PROVIDER_SITE_OTHER): Payer: PRIVATE HEALTH INSURANCE

## 2013-03-20 ENCOUNTER — Ambulatory Visit (INDEPENDENT_AMBULATORY_CARE_PROVIDER_SITE_OTHER): Payer: PRIVATE HEALTH INSURANCE | Admitting: Cardiology

## 2013-03-20 VITALS — BP 132/70 | HR 67 | Ht 62.5 in | Wt 212.0 lb

## 2013-03-20 DIAGNOSIS — I5022 Chronic systolic (congestive) heart failure: Secondary | ICD-10-CM | POA: Insufficient documentation

## 2013-03-20 DIAGNOSIS — I4891 Unspecified atrial fibrillation: Secondary | ICD-10-CM

## 2013-03-20 DIAGNOSIS — I509 Heart failure, unspecified: Secondary | ICD-10-CM

## 2013-03-20 DIAGNOSIS — Z7901 Long term (current) use of anticoagulants: Secondary | ICD-10-CM

## 2013-03-20 DIAGNOSIS — N189 Chronic kidney disease, unspecified: Secondary | ICD-10-CM | POA: Insufficient documentation

## 2013-03-20 DIAGNOSIS — I2510 Atherosclerotic heart disease of native coronary artery without angina pectoris: Secondary | ICD-10-CM

## 2013-03-20 DIAGNOSIS — I2589 Other forms of chronic ischemic heart disease: Secondary | ICD-10-CM

## 2013-03-20 HISTORY — DX: Chronic systolic (congestive) heart failure: I50.22

## 2013-03-20 LAB — POCT INR: INR: 2.9

## 2013-03-20 MED ORDER — FUROSEMIDE 20 MG PO TABS: 20.0000 mg | ORAL_TABLET | Freq: Every day | ORAL | Status: DC

## 2013-03-20 NOTE — Patient Instructions (Addendum)
Increase Lasix to 20mg  daily  Your physician recommends that you return for lab work on 04/03/13...tsh,bmet,bnp,lft  Your physician wants you to follow-up in: 4 moths at the CHF clinic You will receive a reminder letter in the mail two months in advance. If you don't receive a letter, please call our office to schedule the follow-up appointment.

## 2013-03-20 NOTE — Progress Notes (Signed)
Patient ID: [name], female   DOB: 1946-01-01, 67 y.o.   MRN: [id number] PCP: Nicki Reaper  67 yo with history of CAD, ischemic cardiomyopathy, and atrial fibrillation presents for cardiology followup.  Patient was hospitalized in 11/12 at [facility] for VT with ICD discharge.  She had a left heart cath showing patent stents.  EF was 35-40% by echo.  She is on amiodarone.  She has had periodic problems with creatinine rising with medication adjustments.  Repeat echo in 5/14 showed EF 40-45%.    Patient reports stable dyspnea after walking 50-100 feet.  No chest pain, no tachypalpitations or ICD discharge.  Main limiting factor to activity is left knee pain.  She says that ortho told her that she had "torn a ligament."  Blood glucose control is still not ideal. Weight is up 2 lbs. No orthopnea/PND.   ECG: a-paced, LVH, anterolateral and inferior T wave inversions.  Labs (10/12): LDL 73, HDL 44 Labs (11/12): K 3.9, creatinine 1.1, LFTs normal, TSH normal Labs (12/12): K 3.9, creatinine 1.5, proBNP 18 Labs (1/13): LDL 82, HDL 57 Labs (2/13): K 4.3, creatinine 1.5 Labs (5/13): creatinine 2.4 => 1.7, LFTs normal, TSH normal, proBNP 18 Labs (6/13): K 4.2, creatinine 1.7 Labs (10/13): K 4.3, creatinine 1.4 Labs (4/14); LFTs normal, TSH normal, LDL 71, HDL 58 Labs (5/14): K 4.5, creatinine 1.4 Labs (8/14): TSH normal, LFTs normal Labs (9/14): K 4.2, creatinine 1.8, LDL 75, HDL 54  PMH: 1. Diabetes mellitus 2. CVA, TIA in 2010 3. HTN 4. CKD 5. H/o TAH 6. H/o CCY 7. Sciatica 8. Atrial fibrillation 9. CAD: s/p LAD and RCA PCI.  Last LHC in 11/12 with patent proximal LAD stent, ostial 70% D1 (jailed by stent), mild LAD stent patent, patent RCA stents, EF 40% with global hypokinesis.  10. Ischemic cardiomyopathy: Echo (10/12): EF 35-40%, moderate focal basal septal hypertrophy, inferior akinesis, grade I diastolic dysfunction, moderate aortic insufficiency. St Jude dual chamber ICD.   Spironolactone stopped when creatinine rose to 2.5.  Echo (5/14) with EF 40-45%, mild AI.  11. Aortic insufficiency: moderate in past but mild on most recent echo.   12. 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   SH: Divorced.  3 children.  Quit smoking in 1997. Lives in [place].  [place].   FH: CAD  ROS: All systems reviewed and negative except as per HPI.   Current Outpatient Prescriptions  Medication Sig Dispense Refill  . amiodarone (PACERONE) 200 MG tablet TAKE 1 TABLET (200 MG TOTAL) BY MOUTH DAILY.  60 tablet  6  . Blood Glucose Monitoring Suppl (ACCU-CHEK NANO SMARTVIEW) W/DEVICE KIT Use to test blood sugar daily. Dx code: 250.03  1 kit  0  . carvedilol (COREG) 25 MG tablet Take 1 tablet (25 mg total) by mouth 2 (two) times daily with a meal.  180 tablet  2  . cetirizine (ZYRTEC) 10 MG tablet Take 10 mg by mouth daily as needed for allergies.      . Cholecalciferol (VITAMIN D3) 2000 UNITS TABS Take 2,000 Units by mouth daily.       . CRESTOR 20 MG tablet TAKE 1 TABLET BY MOUTH DAILY  30 tablet  1  . diazepam (VALIUM) 5 MG tablet Take 0.5 tablets (2.5 mg total) by mouth 2 (two) times daily.  10 tablet  0  . diclofenac sodium (VOLTAREN) 1 % GEL Apply 2 g topically 4 (four) times daily.  2 Tube  2  . furosemide (LASIX) 20  MG tablet Take 1 tablet (20 mg total) by mouth daily.  30 tablet  11  . gabapentin (NEURONTIN) 400 MG capsule Take 400 mg by mouth 2 (two) times daily.       [name] glucose blood (ACCU-CHEK SMARTVIEW) test strip Test blood sugar 5 times; vary times as instructed. Dx code: 250.03  450 each  2  . Insulin Detemir (LEVEMIR FLEXPEN) 100 UNIT/ML SOPN Inject under skin 50 units in am and 45 units at night  10 pen  3  . insulin glulisine (APIDRA) 100 UNIT/ML injection Inject 18 Units into the skin 3 (three) times daily before meals.  10 mL  12  . lisinopril (PRINIVIL,ZESTRIL) 5 MG tablet TAKE 1 AND 1/2 TABLETS BY MOUTH DAILY (7.5MG )  45 tablet  6  . Multiple Vitamins-Minerals (EYE  VITAMINS PO) Take by mouth daily.      . nitroGLYCERIN (NITROSTAT) 0.4 MG SL tablet Place 0.4 mg under the tongue every 5 (five) minutes as needed for chest pain.      . nizatidine (AXID) 150 MG capsule TAKE ONE CAPSULE BY MOUTH EVERY DAY FOR STOMACH  30 capsule  2  . omeprazole (PRILOSEC) 20 MG capsule TAKE ONE CAPSULE BY MOUTH EVERY DAY  30 capsule  3  . traMADol (ULTRAM) 50 MG tablet Take 1 tablet (50 mg total) by mouth 2 (two) times daily.  60 tablet  1  . traMADol-acetaminophen (ULTRACET) 37.5-325 MG per tablet       . Vitamin D, Ergocalciferol, (DRISDOL) 50000 UNITS CAPS TAKE ONE CAPSULE BY MOUTH ONCE A WEEK  4 capsule  7  . warfarin (COUMADIN) 2.5 MG tablet TAKE AS DIRECTED BY COUMADIN CLINIC  30 tablet  2   No current facility-administered medications for this visit.    BP 132/70  Pulse 67  Ht 5' 2.5" (1.588 m)  Wt 96.163 kg (212 lb)  BMI 38.13 kg/m2 General: NAD, obese Neck: Thick, JVP 8-9 cm, no thyromegaly or thyroid nodule.  Lungs: Clear to auscultation bilaterally with normal respiratory effort. CV: Nondisplaced PMI.  Heart regular S1/S2, no S3/S4, 2/6 early SEM.  Trace ankle edema.  No carotid bruit.  Normal pedal pulses.  Abdomen: Soft, nontender, no hepatosplenomegaly, no distention.  Neurologic: Alert and oriented x 3.  Psych: Normal affect. Extremities: No clubbing or cyanosis.   Assessment/Plan  1. Atrial fibrillation  Paroxysmal, not in atrial fibrillation today. Continue warfarin. Given history of CVA, needs Lovenox bridge if she has to stop warfarin at any point of time. She is on amiodarone. Repeats TSH and LFTs with other labs in 10 days.  She gets yearly eye exams.  2. Coronary artery disease   Nonobstructive disease on last cath. As she is on warfarin also with stable CAD, she is not on aspirin. Continue statin, good lipids in 9/14.  3. Ventricular tachycardia On amiodarone with suppression of VT.  4. Ischemic cardiomyopathy NYHA class II-III symptoms. She  does seem to have some volume overload.  Must be careful with diuresis given history of AKI.  - Continue current doses of Coreg and lisinopril.  She is off spironolactone since recent AKI episode.  - I will increase Lasix to 20 mg daily with BMET/BNP in 10 days.    5. Aortic insufficiency Only mild on most recent echo.   [name] 03/20/2013

## 2013-04-03 ENCOUNTER — Encounter: Payer: Self-pay | Admitting: Internal Medicine

## 2013-04-03 ENCOUNTER — Ambulatory Visit (INDEPENDENT_AMBULATORY_CARE_PROVIDER_SITE_OTHER): Payer: PRIVATE HEALTH INSURANCE | Admitting: Internal Medicine

## 2013-04-03 ENCOUNTER — Ambulatory Visit (INDEPENDENT_AMBULATORY_CARE_PROVIDER_SITE_OTHER): Payer: PRIVATE HEALTH INSURANCE | Admitting: *Deleted

## 2013-04-03 VITALS — BP 106/58 | HR 68 | Temp 97.9°F | Resp 12 | Wt 156.8 lb

## 2013-04-03 DIAGNOSIS — I4891 Unspecified atrial fibrillation: Secondary | ICD-10-CM

## 2013-04-03 DIAGNOSIS — E1065 Type 1 diabetes mellitus with hyperglycemia: Secondary | ICD-10-CM

## 2013-04-03 DIAGNOSIS — IMO0002 Reserved for concepts with insufficient information to code with codable children: Secondary | ICD-10-CM

## 2013-04-03 DIAGNOSIS — I2589 Other forms of chronic ischemic heart disease: Secondary | ICD-10-CM

## 2013-04-03 MED ORDER — INSULIN GLULISINE 100 UNIT/ML IJ SOLN: INTRAMUSCULAR | Status: DC

## 2013-04-03 NOTE — Patient Instructions (Signed)
Continue Levemir to 50 units in am and increase to 55 units before bedtime. Continue Apidra: 14 units for a smaller meal  16 units for a medium meal 20 units before a large meal or dinner - may need to increase to 22 (use 16-22 units with dinner) Continue following sliding scale: - 150- 165: + 1 unit  - 166- 180: + 2 units  - 181- 195: + 3 units  - 196- 210: + 4 units  - >210: + 5 units  - because of the gastroparesis, continue injecting the Apidra after you start eating  - Please try other non-dairy milk types at bedtime (unsweet) - Please return in 1.5 month with your sugar log.

## 2013-04-03 NOTE — Progress Notes (Signed)
Patient ID: [name], female   DOB: 04/11/1945, 68 y.o.   MRN: [id number]  HPI: [name] is a 68 y.o.-year-old female, returning for f/u for DM2, insulin-dependent, uncontrolled, with complications (CAD, ICM - had ICD, peripheral neuropathy, gastroparesis). Last visit 1 mo ago.  Last hemoglobin A1c was: Lab Results  Component Value Date   HGBA1C 9.1* 12/15/2012   HGBA1C 9.6* 07/18/2012   HGBA1C 7.6* 01/14/2011   Pt is on a regimen of: - Levemir 50 units bid - pen  - Apidra 14-16-20 (depending on size of meal) with a meal - vial - + added SSI at last visit target 150, ISF 15  Pt checks her sugars 3-4 a day and they are: - am: low 100-low 200s >> 148-234 >> 170-268 >> 141-238 - before lunch: 180s >> 68-200 >> 68-330 (130-175) >> 56 (one value) - usually [phone] - after lunch: 55-171 >> n/c >> 100 - before dinner: 90s-150 >> 61-188 >> 69-277 (120-180) >> 84-197 (best sugars) - bedtime: 100-150 >>112-279 >> 92-249 >> 110-230 Lowest sugar was 68, 69; she has hypoglycemia awareness at 70. Highest sugar was 544.  Pt's meals are: - Breakfast: small bowl of cereal frosted with 2%  - Lunch: vegetable + starch; turkey/ham sandwich - Dinner: fast food or cooks: meat + vegetables + sometimes potatoes - Snacks: fruit (apple) Did not see nutrition yet.  She has a glass of 2% milk before bedtime. Tried Almond milk but did not like it as it was too sweet.  - Has CKD, last BUN/creatinine:  Lab Results  Component Value Date   BUN 36* 12/15/2012   CREATININE 1.8* 12/15/2012  ACR in 12/15/2012: 1.0.  - last set of lipids: Lab Results  Component Value Date   CHOL 154 12/15/2012   HDL 54.10 12/15/2012   LDLCALC 75 12/15/2012   TRIG 127.0 12/15/2012   CHOLHDL 3 12/15/2012  she is on Crestor. - last eye exam was in 10/2012. No DR.  - + numbness and tingling in her feet - on neurontin  She is supposed to have left knee arthroscopy, but needs to get HbA1c <8%.   I reviewed pt's  medications, allergies, PMH, social hx, family hx and no changes required, except as mentioned above.  ROS: Constitutional: + weight gain, no fatigue, no subjective hyperthermia/hypothermia Eyes: + blurry vision, no xerophthalmia ENT: no sore throat, no nodules palpated in throat, no dysphagia/odynophagia, no hoarseness; + tinnitus Cardiovascular: no CP/SOB/palpitations/+ leg swelling Respiratory: no cough/SOB Gastrointestinal: no N/+V/no D/C Musculoskeletal: no muscle/joint aches Skin: no rashes, + excessive hair growth  PE: BP 106/58  Pulse 68  Temp(Src) 97.9 F (36.6 C) (Oral)  Resp 12  Wt 156 lb 12.8 oz (71.124 kg)  SpO2 97% Body mass index is 28.2 kg/(m^2).  Wt Readings from Last 3 Encounters:  04/03/13 156 lb 12.8 oz (71.124 kg)  03/20/13 212 lb (96.163 kg)  03/02/13 210 lb (95.255 kg)   Constitutional: overweight, in NAD, full supraclavicular fat pads Eyes: PERRLA, EOMI, no exophthalmos ENT: moist mucous membranes, no thyromegaly, no cervical lymphadenopathy Cardiovascular: RRR, No MRG Respiratory: CTA B Gastrointestinal: abdomen soft, NT, ND, BS+ Musculoskeletal: no deformities, strength intact in all 4 Skin: moist, warm, no rashes  ASSESSMENT: 1. DM2, insulin-dependent, uncontrolled, with complications - CAD, ICM - s/p ICD placement - CKD - PN - on neurontin - gastroparesis per [facility] 06/18/2012 >> 60 minutes: 92%, 120 minutes: 82%  PLAN:  1. Patient with long-standing, uncontrolled diabetes, on basal bolus  regimen, with still higher sugars in am and better sugars during the day. - she did not see nutrition yet - We discussed about options for treatment, and I suggested to:  Patient Instructions  Continue Levemir to 50 units in am and increase to 55 units before bedtime. Continue Apidra: 14 units for a smaller meal  16 units for a medium meal 20 units before a large meal or dinner - may need to increase to 22 (use 16-22 units with dinner) Continue following  sliding scale: - 150- 165: + 1 unit  - 166- 180: + 2 units  - 181- 195: + 3 units  - 196- 210: + 4 units  - >210: + 5 units  - because of the gastroparesis, continue injecting the Apidra after you start eating  - Please try other non-dairy milk types at bedtime (unsweet) - Please return in 1.5 month with your sugar log.  - continue checking her sugars at different times of the day - check 4 times a day, rotating checks - up to date with eye exams - had the flu vaccine this fall - will check a HbA1c today >> goes to Dr Claris Gladden office for labs >> will add the HbA1c - Return to clinic in 1.5 month with sugar log   Sugar in the office: 49 (pt feeling hypoglycemic) - took 20 units after her CBG 300s before a light lunch.   Clinical Support on 04/03/2013  Component Date Value Range Status  . Sodium 04/03/2013 143  135 - 145 mEq/L Final  . Potassium 04/03/2013 3.8  3.5 - 5.1 mEq/L Final  . Chloride 04/03/2013 108  96 - 112 mEq/L Final  . CO2 04/03/2013 26  19 - 32 mEq/L Final  . Glucose, Bld 04/03/2013 90  70 - 99 mg/dL Final  . BUN 04/03/2013 32* 6 - 23 mg/dL Final  . Creatinine, Ser 04/03/2013 1.6* 0.4 - 1.2 mg/dL Final  . Calcium 04/03/2013 9.0  8.4 - 10.5 mg/dL Final  . GFR 04/03/2013 34.26* >60.00 mL/min Final  . Pro B Natriuretic peptide (BNP) 04/03/2013 30.0  0.0 - 100.0 pg/mL Final  . TSH 04/03/2013 2.83  0.35 - 5.50 uIU/mL Final  . Total Bilirubin 04/03/2013 0.8  0.3 - 1.2 mg/dL Final  . Bilirubin, Direct 04/03/2013 0.1  0.0 - 0.3 mg/dL Final  . Alkaline Phosphatase 04/03/2013 66  39 - 117 U/L Final  . AST 04/03/2013 25  0 - 37 U/L Final  . ALT 04/03/2013 28  0 - 35 U/L Final  . Total Protein 04/03/2013 7.2  6.0 - 8.3 g/dL Final  . Albumin 04/03/2013 3.7  3.5 - 5.2 g/dL Final  . Hemoglobin A1C 04/03/2013 8.2* 4.6 - 6.5 % Final   Glycemic Control Guidelines for People with Diabetes:Non Diabetic:  <6%Goal of Therapy: <7%Additional Action Suggested:  >8%    HbA1c improved >>  continue above plan.

## 2013-04-04 LAB — HEPATIC FUNCTION PANEL
ALK PHOS: 66 U/L (ref 39–117)
ALT: 28 U/L (ref 0–35)
AST: 25 U/L (ref 0–37)
Albumin: 3.7 g/dL (ref 3.5–5.2)
BILIRUBIN DIRECT: 0.1 mg/dL (ref 0.0–0.3)
BILIRUBIN TOTAL: 0.8 mg/dL (ref 0.3–1.2)
Total Protein: 7.2 g/dL (ref 6.0–8.3)

## 2013-04-04 LAB — BASIC METABOLIC PANEL
BUN: 32 mg/dL — ABNORMAL HIGH (ref 6–23)
CHLORIDE: 108 meq/L (ref 96–112)
CO2: 26 meq/L (ref 19–32)
Calcium: 9 mg/dL (ref 8.4–10.5)
Creatinine, Ser: 1.6 mg/dL — ABNORMAL HIGH (ref 0.4–1.2)
GFR: 34.26 mL/min — ABNORMAL LOW (ref >60.00)
Glucose, Bld: 90 mg/dL (ref 70–99)
POTASSIUM: 3.8 meq/L (ref 3.5–5.1)
SODIUM: 143 meq/L (ref 135–145)

## 2013-04-04 LAB — TSH: TSH: 2.83 u[IU]/mL (ref 0.35–5.50)

## 2013-04-04 LAB — BRAIN NATRIURETIC PEPTIDE: Pro B Natriuretic peptide (BNP): 30 pg/mL (ref 0.0–100.0)

## 2013-04-04 LAB — HEMOGLOBIN A1C: Hgb A1c MFr Bld: 8.2 % — ABNORMAL HIGH (ref 4.6–6.5)

## 2013-04-26 ENCOUNTER — Ambulatory Visit (INDEPENDENT_AMBULATORY_CARE_PROVIDER_SITE_OTHER): Payer: PRIVATE HEALTH INSURANCE | Admitting: Pharmacist

## 2013-04-26 ENCOUNTER — Other Ambulatory Visit: Payer: Self-pay | Admitting: Cardiology

## 2013-04-26 DIAGNOSIS — I4891 Unspecified atrial fibrillation: Secondary | ICD-10-CM

## 2013-04-26 DIAGNOSIS — Z7901 Long term (current) use of anticoagulants: Secondary | ICD-10-CM

## 2013-04-26 DIAGNOSIS — Z5181 Encounter for therapeutic drug level monitoring: Secondary | ICD-10-CM | POA: Insufficient documentation

## 2013-04-26 LAB — POCT INR: INR: 2.1

## 2013-04-28 ENCOUNTER — Other Ambulatory Visit: Payer: Self-pay

## 2013-04-28 MED ORDER — ROSUVASTATIN CALCIUM 20 MG PO TABS: ORAL_TABLET | ORAL | Status: DC

## 2013-05-01 ENCOUNTER — Encounter: Payer: PRIVATE HEALTH INSURANCE | Admitting: Internal Medicine

## 2013-05-04 ENCOUNTER — Ambulatory Visit (INDEPENDENT_AMBULATORY_CARE_PROVIDER_SITE_OTHER): Payer: PRIVATE HEALTH INSURANCE | Admitting: Internal Medicine

## 2013-05-04 ENCOUNTER — Encounter: Payer: Self-pay | Admitting: Internal Medicine

## 2013-05-04 VITALS — BP 138/76 | HR 61 | Ht 63.0 in | Wt 209.0 lb

## 2013-05-04 DIAGNOSIS — I472 Ventricular tachycardia, unspecified: Secondary | ICD-10-CM

## 2013-05-04 DIAGNOSIS — I509 Heart failure, unspecified: Secondary | ICD-10-CM

## 2013-05-04 DIAGNOSIS — I10 Essential (primary) hypertension: Secondary | ICD-10-CM

## 2013-05-04 DIAGNOSIS — I5022 Chronic systolic (congestive) heart failure: Secondary | ICD-10-CM

## 2013-05-04 DIAGNOSIS — I2510 Atherosclerotic heart disease of native coronary artery without angina pectoris: Secondary | ICD-10-CM

## 2013-05-04 DIAGNOSIS — I4729 Other ventricular tachycardia: Secondary | ICD-10-CM

## 2013-05-04 DIAGNOSIS — I255 Ischemic cardiomyopathy: Secondary | ICD-10-CM

## 2013-05-04 DIAGNOSIS — I2589 Other forms of chronic ischemic heart disease: Secondary | ICD-10-CM

## 2013-05-04 DIAGNOSIS — I4891 Unspecified atrial fibrillation: Secondary | ICD-10-CM

## 2013-05-04 LAB — MDC_IDC_ENUM_SESS_TYPE_INCLINIC
Battery Remaining Longevity: 73.2 mo
Brady Statistic RA Percent Paced: 98 %
Date Time Interrogation Session: 20150212170810
HighPow Impedance: 48.1163
Implantable Pulse Generator Serial Number: 819806
Lead Channel Impedance Value: 462.5 Ohm
Lead Channel Impedance Value: 537.5 Ohm
Lead Channel Pacing Threshold Amplitude: 0.75 V
Lead Channel Pacing Threshold Amplitude: 0.75 V
Lead Channel Pacing Threshold Amplitude: 1 V
Lead Channel Pacing Threshold Pulse Width: 0.5 ms
Lead Channel Pacing Threshold Pulse Width: 0.5 ms
Lead Channel Pacing Threshold Pulse Width: 0.5 ms
Lead Channel Sensing Intrinsic Amplitude: 11.8 mV
Lead Channel Setting Pacing Amplitude: 2.5 V
Lead Channel Setting Pacing Pulse Width: 0.5 ms
MDC IDC MSMT LEADCHNL RA PACING THRESHOLD PULSEWIDTH: 0.5 ms
MDC IDC MSMT LEADCHNL RA SENSING INTR AMPL: 3.9 mV
MDC IDC MSMT LEADCHNL RV PACING THRESHOLD AMPLITUDE: 1 V
MDC IDC PG MODEL: 2241
MDC IDC SET LEADCHNL RA PACING AMPLITUDE: 2 V
MDC IDC SET LEADCHNL RV SENSING SENSITIVITY: 0.5 mV
MDC IDC STAT BRADY RV PERCENT PACED: 2 %
Zone Setting Detection Interval: 270 ms
Zone Setting Detection Interval: 340 ms

## 2013-05-04 NOTE — Progress Notes (Signed)
PCP: Webb Silversmith, NP Primary Cardiologist:  Dr Daisy Floro is a 68 y.o. female who presents today for routine electrophysiology followup.  Since last being seen in our clinic, the patient reports doing very well.  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  She has struggled with weight reduction.  Her blood sugars are not well controlled.  She is not very active.  Today, she denies symptoms of palpitations, chest pain, shortness of breath,  lower extremity edema, dizziness, presyncope, syncope, or ICD shocks.  The patient is otherwise without complaint today.   Past Medical History  Diagnosis Date  . Chronic systolic heart failure   . Diabetes mellitus     type 1  . Hypertension   . Stroke 2010    eye doctor said she had TIA  . Ischemic cardiomyopathy   . Ventricular tachycardia     Polymorphic  . 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   . Atrial fibrillation     controlled with amiodarone, on coumadin  . Coronary artery disease 01/31/2011  . Chronic renal insufficiency   . Lumbar spondylosis 01/11/2012  . History of chicken pox   . Allergy   . Hyperlipidemia    Past Surgical History  Procedure Laterality Date  . Abdominal hysterectomy  1995  . Cholecystectomy  1985  . Tubaligation  1980  . Icd  2003/2007    implanted by Dr Rollene Fare, most recent generator change 2/13 by Dr Rayann Heman, Analyze ST study patient    Current Outpatient Prescriptions  Medication Sig Dispense Refill  . amiodarone (PACERONE) 200 MG tablet TAKE 1 TABLET (200 MG TOTAL) BY MOUTH DAILY.  60 tablet  6  . carvedilol (COREG) 25 MG tablet Take 1 tablet (25 mg total) by mouth 2 (two) times daily with a meal.  180 tablet  2  . cetirizine (ZYRTEC) 10 MG tablet Take 10 mg by mouth daily as needed for allergies.      . Cholecalciferol (VITAMIN D3) 2000 UNITS TABS Take 2,000 Units by mouth daily.       . diclofenac sodium (VOLTAREN) 1 % GEL Apply 2 g topically as needed.      .  furosemide (LASIX) 20 MG tablet Take 1 tablet (20 mg total) by mouth daily.  30 tablet  11  . gabapentin (NEURONTIN) 400 MG capsule Take 400 mg by mouth 2 (two) times daily.       . Insulin Detemir (LEVEMIR) 100 UNIT/ML Pen Inject under skin 50 units in am and 55 units at night      . insulin glulisine (APIDRA) 100 UNIT/ML injection Sliding scale      . lisinopril (PRINIVIL,ZESTRIL) 5 MG tablet TAKE 1 TABLET BY MOUTH DAILY      . Multiple Vitamins-Minerals (EYE VITAMINS PO) Take by mouth daily.      . nitroGLYCERIN (NITROSTAT) 0.4 MG SL tablet Place 0.4 mg under the tongue every 5 (five) minutes as needed for chest pain.      . nizatidine (AXID) 150 MG capsule TAKE ONE CAPSULE BY MOUTH EVERY DAY FOR STOMACH  30 capsule  2  . rosuvastatin (CRESTOR) 20 MG tablet TAKE 1 TABLET BY MOUTH EVERY DAY  30 tablet  1  . Vitamin D, Ergocalciferol, (DRISDOL) 50000 UNITS CAPS TAKE ONE CAPSULE BY MOUTH ONCE A WEEK  4 capsule  7  . warfarin (COUMADIN) 2.5 MG tablet TAKE AS DIRECTED BY COUMADIN CLINIC  30 tablet  2   No  current facility-administered medications for this visit.    Physical Exam: Filed Vitals:   05/04/13 1504  BP: 138/76  Pulse: 61  Height: 5\' 3"  (1.6 m)  Weight: 209 lb (94.802 kg)    GEN- The patient is well appearing, alert and oriented x 3 today.   Head- normocephalic, atraumatic Eyes-  Sclera clear, conjunctiva pink Ears- hearing intact Oropharynx- clear Lungs- Clear to ausculation bilaterally, normal work of breathing Chest- ICD pocket is well healed Heart- Regular rate and rhythm, no murmurs, rubs or gallops, PMI not laterally displaced GI- soft, NT, ND, + BS Extremities- no clubbing, cyanosis, or edema  ICD interrogation- reviewed in detail today,  See PACEART report  Assessment and Plan:  1.  Chronic systolic dysfunction euvolemic today Stable on an appropriate medical regimen Normal ICD function See Pace Art report No changes today  2. VT Controlled with  amiodarone 1/15 labs are reviewed  3. Amiodarone Controlled Continue long term anticoagulation  chads2vasc score is 8  Follow-up with Dr Aundra Dubin as scheduled Merlin Return to see Jerene Pitch in 6 months I will see in a year

## 2013-05-04 NOTE — Patient Instructions (Signed)
Your physician wants you to follow-up in: 6 months with Ileene Hutchinson, PA and 12 months with Dr Vallery Ridge will receive a reminder letter in the mail two months in advance. If you don't receive a letter, please call our office to schedule the follow-up appointment.  Remote monitoring is used to monitor your Pacemaker or ICD from home. This monitoring reduces the number of office visits required to check your device to one time per year. It allows [name] to keep an eye on the functioning of your device to ensure it is working properly. You are scheduled for a device check from home on 08/08/13. You may send your transmission at any time that day. If you have a wireless device, the transmission will be sent automatically. After your physician reviews your transmission, you will receive a postcard with your next transmission date.

## 2013-05-12 ENCOUNTER — Encounter: Payer: Self-pay | Admitting: Internal Medicine

## 2013-05-14 ENCOUNTER — Other Ambulatory Visit: Payer: Self-pay | Admitting: Internal Medicine

## 2013-05-15 NOTE — Telephone Encounter (Signed)
Last prescribed 12/27/12--Last office visit with you 04/03/13--please advise

## 2013-05-17 ENCOUNTER — Ambulatory Visit (INDEPENDENT_AMBULATORY_CARE_PROVIDER_SITE_OTHER)
Admission: RE | Admit: 2013-05-17 | Discharge: 2013-05-17 | Disposition: A | Discharge status: Home / Self Care | Payer: PRIVATE HEALTH INSURANCE | Source: Ambulatory Visit | Attending: Physician Assistant | Admitting: Physician Assistant

## 2013-05-17 ENCOUNTER — Ambulatory Visit (INDEPENDENT_AMBULATORY_CARE_PROVIDER_SITE_OTHER): Payer: PRIVATE HEALTH INSURANCE | Admitting: Physician Assistant

## 2013-05-17 ENCOUNTER — Other Ambulatory Visit (INDEPENDENT_AMBULATORY_CARE_PROVIDER_SITE_OTHER): Payer: PRIVATE HEALTH INSURANCE

## 2013-05-17 ENCOUNTER — Encounter: Payer: Self-pay | Admitting: Physician Assistant

## 2013-05-17 ENCOUNTER — Other Ambulatory Visit: Payer: Self-pay | Admitting: Physician Assistant

## 2013-05-17 ENCOUNTER — Other Ambulatory Visit: Payer: Self-pay | Admitting: Internal Medicine

## 2013-05-17 VITALS — BP 118/70 | HR 75 | Temp 97.1°F | Ht 63.0 in | Wt 213.8 lb

## 2013-05-17 DIAGNOSIS — Z8719 Personal history of other diseases of the digestive system: Secondary | ICD-10-CM

## 2013-05-17 DIAGNOSIS — R1032 Left lower quadrant pain: Secondary | ICD-10-CM

## 2013-05-17 DIAGNOSIS — R52 Pain, unspecified: Secondary | ICD-10-CM

## 2013-05-17 DIAGNOSIS — IMO0002 Reserved for concepts with insufficient information to code with codable children: Secondary | ICD-10-CM

## 2013-05-17 DIAGNOSIS — E1065 Type 1 diabetes mellitus with hyperglycemia: Secondary | ICD-10-CM

## 2013-05-17 LAB — CBC WITH DIFFERENTIAL/PLATELET
BASOS ABS: 0 10*3/uL (ref 0.0–0.1)
Basophils Relative: 0.3 % (ref 0.0–3.0)
EOS PCT: 1 % (ref 0.0–5.0)
Eosinophils Absolute: 0.1 10*3/uL (ref 0.0–0.7)
HEMATOCRIT: 42.8 % (ref 36.0–46.0)
Hemoglobin: 13.9 g/dL (ref 12.0–15.0)
LYMPHS PCT: 20.5 % (ref 12.0–46.0)
Lymphs Abs: 1.3 10*3/uL (ref 0.7–4.0)
MCHC: 32.4 g/dL (ref 30.0–36.0)
MCV: 91.4 fl (ref 78.0–100.0)
Monocytes Absolute: 0.5 10*3/uL (ref 0.1–1.0)
Monocytes Relative: 8.4 % (ref 3.0–12.0)
Neutro Abs: 4.3 10*3/uL (ref 1.4–7.7)
Neutrophils Relative %: 69.8 % (ref 43.0–77.0)
PLATELETS: 229 10*3/uL (ref 150.0–400.0)
RBC: 4.68 Mil/uL (ref 3.87–5.11)
RDW: 15.3 % — AB (ref 11.5–14.6)
WBC: 6.2 10*3/uL (ref 4.5–10.5)

## 2013-05-17 LAB — URINALYSIS, ROUTINE W REFLEX MICROSCOPIC
Bilirubin Urine: NEGATIVE
Hgb urine dipstick: NEGATIVE
Ketones, ur: NEGATIVE
NITRITE: NEGATIVE
PH: 5.5 (ref 5.0–8.0)
RBC / HPF: NONE SEEN (ref 0–?)
TOTAL PROTEIN, URINE-UPE24: NEGATIVE
Urine Glucose: NEGATIVE
Urobilinogen, UA: 0.2 (ref 0.0–1.0)

## 2013-05-17 LAB — BASIC METABOLIC PANEL
BUN: 25 mg/dL — AB (ref 6–23)
CO2: 27 mEq/L (ref 19–32)
CREATININE: 1.4 mg/dL — AB (ref 0.4–1.2)
Calcium: 8.8 mg/dL (ref 8.4–10.5)
Chloride: 107 mEq/L (ref 96–112)
GFR: 39.3 mL/min — AB (ref >60.00)
Glucose, Bld: 148 mg/dL — ABNORMAL HIGH (ref 70–99)
Potassium: 4 mEq/L (ref 3.5–5.1)
Sodium: 141 mEq/L (ref 135–145)

## 2013-05-17 LAB — HEPATIC FUNCTION PANEL
ALBUMIN: 3.7 g/dL (ref 3.5–5.2)
ALT: 31 U/L (ref 0–35)
AST: 30 U/L (ref 0–37)
Alkaline Phosphatase: 71 U/L (ref 39–117)
BILIRUBIN DIRECT: 0.1 mg/dL (ref 0.0–0.3)
TOTAL PROTEIN: 7.5 g/dL (ref 6.0–8.3)
Total Bilirubin: 0.7 mg/dL (ref 0.3–1.2)

## 2013-05-17 LAB — TSH: TSH: 1.72 u[IU]/mL (ref 0.35–5.50)

## 2013-05-17 MED ORDER — NITROFURANTOIN MONOHYD MACRO 100 MG PO CAPS: 100.0000 mg | ORAL_CAPSULE | Freq: Two times a day (BID) | ORAL | Status: DC

## 2013-05-17 NOTE — Progress Notes (Signed)
Pre visit review using our clinic review tool, if applicable. No additional management support is needed unless otherwise documented below in the visit note. 

## 2013-05-17 NOTE — Progress Notes (Signed)
Subjective:    Patient ID: [name], female    DOB: 06-Jun-1945, 68 y.o.   MRN: [id number]  HPI Comments: Patient is a 68 year old female who presents to the office with complaint of left sided lower abdominal pain. Reports has been off and on for the last 2-3 weeks. States feels bloated. Pain is constant, has had some relief with laying down, passing gas and with Pepto Bismol. Reports pain seems to increase after eating. Has intermittent diarrhea with some vomiting after eating due to pains. Denies injury or trauma. Denies bloody, tarry stool, mucous in stool,  blood in vomitus, chest pain/palpitations, lightheaded, weakness or SOB. History significant for past diagnosis of ulcers and nervous stomach syndrome.      Review of Systems  Constitutional: Negative for fever and chills.  Respiratory: Negative for shortness of breath.   Cardiovascular: Negative for chest pain and palpitations.  Gastrointestinal: Positive for nausea, vomiting, abdominal pain (left lower quatrant) and diarrhea. Negative for constipation, blood in stool, anal bleeding and rectal pain.  Genitourinary: Negative for hematuria and difficulty urinating.  Skin: Negative for rash.  Neurological: Negative for weakness, light-headedness and headaches.   Past Medical History  Diagnosis Date  . Chronic systolic heart failure   . Diabetes mellitus     type 1  . Hypertension   . Stroke 2010    eye doctor said she had TIA  . Ischemic cardiomyopathy   . Ventricular tachycardia     Polymorphic  . 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   . Atrial fibrillation     controlled with amiodarone, on coumadin  . Coronary artery disease 01/31/2011  . Chronic renal insufficiency   . Lumbar spondylosis 01/11/2012  . History of chicken pox   . Allergy   . Hyperlipidemia        Objective:   Physical Exam  Vitals reviewed. Constitutional: She is oriented to person, place, and time. She appears well-developed  and well-nourished. No distress.  HENT:  Head: Normocephalic and atraumatic.  Right Ear: External ear normal.  Left Ear: External ear normal.  Eyes: Conjunctivae are normal. No scleral icterus.  Neck: Normal range of motion.  Cardiovascular: Normal rate, regular rhythm and intact distal pulses.  Exam reveals no gallop and no friction rub.   No murmur heard. Pulmonary/Chest: Effort normal and breath sounds normal. She has no wheezes. She has no rales.  Abdominal: Soft. She exhibits no distension, no fluid wave, no pulsatile midline mass and no mass. Bowel sounds are increased. There is tenderness in the left upper quadrant and left lower quadrant. There is no rigidity, no rebound, no guarding and no tenderness at McBurney's point.  Left U/LQ tender to light and deep palpation. right U/LQ, suprapubic region non tender. Nearly 2 cm area of ecchymosis noted to left lateral umbilicus, patient is insulin dependant and on Coumadin, states recent injection site.  Abdomen is obese, unable to appreciate bloating or distention. Nonpalpable liver, kidney or spleen.   Neurological: She is alert and oriented to person, place, and time.  Skin: Skin is warm and dry. She is not diaphoretic.  Psychiatric: She has a normal mood and affect.    Filed Vitals:   05/17/13 1124  BP: 118/70  Pulse: 75  Temp: 97.1 F (36.2 C)   Wt Readings from Last 3 Encounters:  05/17/13 213 lb 12.8 oz (96.979 kg)  05/04/13 209 lb (94.802 kg)  04/03/13 156 lb 12.8 oz (71.124 kg)  Assessment & Plan:    Acute abdominal pain Xray of abdomen Labs ordered and will be reviewed.    IBS linzess 290 mcg one tab daily, 32 day sample provided to patient

## 2013-05-17 NOTE — Patient Instructions (Signed)
It was great to meet you today Ms. Clear!  Abdominal Pain, Adult Many things can cause belly (abdominal) pain. Most times, the belly pain is not dangerous. Many cases of belly pain can be watched and treated at home. HOME CARE   Do not take medicines that help you go poop (laxatives) unless told to by your doctor.  Only take medicine as told by your doctor.  Eat or drink as told by your doctor. Your doctor will tell you if you should be on a special diet. GET HELP IF:  You do not know what is causing your belly pain.  You have belly pain while you are sick to your stomach (nauseous) or have runny poop (diarrhea).  You have pain while you pee or poop.  Your belly pain wakes you up at night.  You have belly pain that gets worse or better when you eat.  You have belly pain that gets worse when you eat fatty foods. GET HELP RIGHT AWAY IF:   The pain does not go away within 2 hours.  You have a fever.  You keep throwing up (vomiting).  The pain changes and is only in the right or left part of the belly.  You have bloody or tarry looking poop. MAKE SURE YOU:   Understand these instructions.  Will watch your condition.  Will get help right away if you are not doing well or get worse. Document Released: 08/26/2007 Document Revised: 12/28/2012 Document Reviewed: 11/16/2012 [facility] Patient Information 2014 [place].      Irritable Bowel Syndrome Irritable Bowel Syndrome (IBS) is caused by a disturbance of normal bowel function. Other terms used are spastic colon, mucous colitis, and irritable colon. It does not require surgery, nor does it lead to cancer. There is no cure for IBS. But with proper diet, stress reduction, and medication, you will find that your problems (symptoms) will gradually disappear or improve. IBS is a common digestive disorder. It usually appears in late adolescence or early adulthood. Women develop it twice as often as men. CAUSES  After  food has been digested and absorbed in the small intestine, waste material is moved into the colon (large intestine). In the colon, water and salts are absorbed from the undigested products coming from the small intestine. The remaining residue, or fecal material, is held for elimination. Under normal circumstances, gentle, rhythmic contractions on the bowel walls push the fecal material along the colon towards the rectum. In IBS, however, these contractions are irregular and poorly coordinated. The fecal material is either retained too long, resulting in constipation, or expelled too soon, producing diarrhea. SYMPTOMS  The most common symptom of IBS is pain. It is typically in the lower left side of the belly (abdomen). But it may occur anywhere in the abdomen. It can be felt as heartburn, backache, or even as a dull pain in the arms or shoulders. The pain comes from excessive bowel-muscle spasms and from the buildup of gas and fecal material in the colon. This pain:  Can range from sharp belly (abdominal) cramps to a dull, continuous ache.  Usually worsens soon after eating.  Is typically relieved by having a bowel movement or passing gas. Abdominal pain is usually accompanied by constipation. But it may also produce diarrhea. The diarrhea typically occurs right after a meal or upon arising in the morning. The stools are typically soft and watery. They are often flecked with secretions (mucus). Other symptoms of IBS include:  Bloating.  Loss  of appetite.  Heartburn.  Feeling sick to your stomach (nausea).  Belching  Vomiting  Gas. IBS may also cause a number of symptoms that are unrelated to the digestive system:  Fatigue.  Headaches.  Anxiety  Shortness of breath  Difficulty in concentrating.  Dizziness. These symptoms tend to come and go. DIAGNOSIS  The symptoms of IBS closely mimic the symptoms of other, more serious digestive disorders. So your caregiver may wish to  perform a variety of additional tests to exclude these disorders. He/she wants to be certain of learning what is wrong (diagnosis). The nature and purpose of each test will be explained to you. TREATMENT A number of medications are available to help correct bowel function and/or relieve bowel spasms and abdominal pain. Among the drugs available are:  Mild, non-irritating laxatives for severe constipation and to help restore normal bowel habits.  Specific anti-diarrheal medications to treat severe or prolonged diarrhea.  Anti-spasmodic agents to relieve intestinal cramps.  Your caregiver may also decide to treat you with a mild tranquilizer or sedative during unusually stressful periods in your life. The important thing to remember is that if any drug is prescribed for you, make sure that you take it exactly as directed. Make sure that your caregiver knows how well it worked for you. HOME CARE INSTRUCTIONS   Avoid foods that are high in fat or oils. Some examples [id number] cream, butter, frankfurters, sausage, and other fatty meats.  Avoid foods that have a laxative effect, such as fruit, fruit juice, and dairy products.  Cut out carbonated drinks, chewing gum, and "gassy" foods, such as beans and cabbage. This may help relieve bloating and belching.  Bran taken with plenty of liquids may help relieve constipation.  Keep track of what foods seem to trigger your symptoms.  Avoid emotionally charged situations or circumstances that produce anxiety.  Start or continue exercising.  Get plenty of rest and sleep. MAKE SURE YOU:   Understand these instructions.  Will watch your condition.  Will get help right away if you are not doing well or get worse. Document Released: 03/09/2005 Document Revised: 06/01/2011 Document Reviewed: 10/28/2007 [facility] Patient Information 2014 [place].

## 2013-05-18 ENCOUNTER — Ambulatory Visit: Payer: PRIVATE HEALTH INSURANCE | Admitting: Internal Medicine

## 2013-06-03 ENCOUNTER — Other Ambulatory Visit: Payer: Self-pay | Admitting: Internal Medicine

## 2013-06-05 ENCOUNTER — Ambulatory Visit (INDEPENDENT_AMBULATORY_CARE_PROVIDER_SITE_OTHER): Payer: PRIVATE HEALTH INSURANCE | Admitting: Internal Medicine

## 2013-06-05 ENCOUNTER — Ambulatory Visit (INDEPENDENT_AMBULATORY_CARE_PROVIDER_SITE_OTHER): Payer: PRIVATE HEALTH INSURANCE | Admitting: *Deleted

## 2013-06-05 ENCOUNTER — Encounter: Payer: Self-pay | Admitting: Internal Medicine

## 2013-06-05 VITALS — BP 118/60 | HR 65 | Temp 98.0°F | Resp 12 | Wt 214.0 lb

## 2013-06-05 DIAGNOSIS — Z7901 Long term (current) use of anticoagulants: Secondary | ICD-10-CM

## 2013-06-05 DIAGNOSIS — IMO0002 Reserved for concepts with insufficient information to code with codable children: Secondary | ICD-10-CM

## 2013-06-05 DIAGNOSIS — Z5181 Encounter for therapeutic drug level monitoring: Secondary | ICD-10-CM

## 2013-06-05 DIAGNOSIS — I4891 Unspecified atrial fibrillation: Secondary | ICD-10-CM

## 2013-06-05 DIAGNOSIS — E1065 Type 1 diabetes mellitus with hyperglycemia: Secondary | ICD-10-CM

## 2013-06-05 LAB — POCT INR: INR: 3.7

## 2013-06-05 NOTE — Telephone Encounter (Signed)
Ok to refill Axid, she should not be taking Prilosec with it. She needs Vit D checked prior to restarting Vit D

## 2013-06-05 NOTE — Patient Instructions (Addendum)
Continue Levemir to 50 units in am and increase to 55 units before bedtime. Continue Apidra: 14 units for a smaller meal  16 units for a medium meal 20 units before a large meal or dinner - may need to increase to 22 (use 16-22 units with dinner) Continue following sliding scale: - 150- 165: + 1 unit  - 166- 180: + 2 units  - 181- 195: + 3 units  - 196- 210: + 4 units  - >210: + 5 units  - because of the gastroparesis, continue injecting the Apidra after you start eating  - Please return in 1.5 month with your sugar log.

## 2013-06-05 NOTE — Progress Notes (Signed)
Patient ID: [name], female   DOB: [birth year], 68 y.o.   MRN: [id number]  HPI: [name] is a 68 y.o.-year-old female, returning for f/u for DM2, insulin-dependent, uncontrolled, with complications (CAD, ICM - had ICD, peripheral neuropathy, gastroparesis). Last visit 2 mo ago.  Last hemoglobin A1c was: Lab Results  Component Value Date   HGBA1C 8.2* 04/03/2013   HGBA1C 9.1* 12/15/2012   HGBA1C 9.6* 07/18/2012   Pt is on a regimen of: - Levemir 50 units in am and 55 in hs - pen  - Apidra 14-16-20 (depending on size of meal) with a meal - vial - + added SSI at last visit target 150, ISF 15  Pt checks her sugars 3-4 a day and they are - brings a good log: - am: low 100-low 200s >> 148-234 >> 170-268 >> 141-238 >> 180-214 - before lunch: 180s >> 68-200 >> 68-330 (130-175) >> 56 (one value) 100-215 >> 103-256 (most <170) - after lunch: 55-171 >> n/c >> 100 >> n/c - before dinner: 90s-150 >> 61-188 >> 69-277 (120-180) >> 84-197 (best sugars) >> 64-206 (most 100-170) - bedtime: 100-150 >>112-279 >> 92-249 >> 110-230 >> 92-211 (most 130-180), one 321 Lowest sugar was 56 >> 60; she has hypoglycemia awareness at 70. Highest sugar was 544 >> 358 x 1, 321 x1 .  Pt's meals are: - Breakfast: small bowl of cereal frosted with 2%  - Lunch: vegetable + starch; turkey/ham sandwich - Dinner: fast food or cooks: meat + vegetables + sometimes potatoes - Snacks: fruit (apple) Did not see nutrition yet.  She has a glass of 2% milk before bedtime. Tried Almond milk but did not like it as it was too sweet.  - Has CKD, last BUN/creatinine:  Lab Results  Component Value Date   BUN 25* 05/17/2013   CREATININE 1.4* 05/17/2013  ACR in 12/15/2012: 1.0.  - last set of lipids: Lab Results  Component Value Date   CHOL 154 12/15/2012   HDL 54.10 12/15/2012   LDLCALC 75 12/15/2012   TRIG 127.0 12/15/2012   CHOLHDL 3 12/15/2012  she is on Crestor. - last eye exam was in 10/2012. No DR.  - +  numbness and tingling in her feet - on neurontin  She is supposed to have left knee arthroscopy, but needs to get HbA1c <8%.   I reviewed pt's medications, allergies, PMH, social hx, family hx and no changes required, except as mentioned above.  ROS: Constitutional: + weight gain, no fatigue, no subjective hyperthermia/hypothermia Eyes: no blurry vision, no xerophthalmia ENT: no sore throat, no nodules palpated in throat, no dysphagia/odynophagia, no hoarseness; + tinnitus Cardiovascular: no CP/+ SOB/no palpitations/+ leg swelling Respiratory: no cough/+ SOB Gastrointestinal: no N/V/no D/C Musculoskeletal: no muscle/joint aches Skin: no rashes  PE: BP 118/60  Pulse 65  Temp(Src) 98 F (36.7 C) (Oral)  Resp 12  Wt 214 lb (97.07 kg)  SpO2 94% Body mass index is 37.92 kg/(m^2).  Wt Readings from Last 3 Encounters:  06/05/13 214 lb (97.07 kg)  05/17/13 213 lb 12.8 oz (96.979 kg)  05/04/13 209 lb (94.802 kg)   Constitutional: overweight, in NAD, full supraclavicular fat pads Eyes: PERRLA, EOMI, no exophthalmos ENT: moist mucous membranes, no thyromegaly, no cervical lymphadenopathy Cardiovascular: RRR, No MRG Respiratory: CTA B Gastrointestinal: abdomen soft, NT, ND, BS+ Musculoskeletal: no deformities, strength intact in all 4 Skin: moist, warm, no rashes  ASSESSMENT: 1. DM2, insulin-dependent, uncontrolled, with complications - CAD, ICM - s/p ICD placement -  CKD - PN - on neurontin - gastroparesis per GES 06/18/2012 >> 60 minutes: 92%, 120 minutes: 82%  PLAN:  1. Patient with long-standing, uncontrolled diabetes, on basal bolus regimen, with still higher sugars in am and slightly better sugars during the day. - she did not see nutrition yet - We discussed about options for treatment, and I suggested to:  Patient Instructions  Continue Levemir to 50 units in am and increase to 55 units before bedtime. Continue Apidra: 14 units for a smaller meal  16 units for a  medium meal 20 units before a large meal or dinner - may need to increase to 22 (use 16-22 units with dinner) Continue following sliding scale: - 150- 165: + 1 unit  - 166- 180: + 2 units  - 181- 195: + 3 units  - 196- 210: + 4 units  - >210: + 5 units  - because of the gastroparesis, continue injecting the Apidra after you start eating  - Please try other non-dairy milk types at bedtime (unsweet) - Please return in 1.5 month with your sugar log.  - continue checking her sugars at different times of the day - check 4 times a day, rotating checks - up to date with eye exams - had the flu vaccine this fall - given new sugar logs - will check a HbA1c at next visit - Return to clinic in 1.5 month with sugar log

## 2013-06-05 NOTE — Telephone Encounter (Signed)
i wanted to confirm pt is taking both omeprazole and nizatidine together--as well as her not having had Vitamin D labs in a while--please advise if okay to refill

## 2013-06-06 ENCOUNTER — Other Ambulatory Visit: Payer: Self-pay | Admitting: Internal Medicine

## 2013-06-07 ENCOUNTER — Telehealth: Payer: Self-pay | Admitting: Physician Assistant

## 2013-06-07 MED ORDER — OMEPRAZOLE 20 MG PO CPDR: 20.0000 mg | DELAYED_RELEASE_CAPSULE | Freq: Every day | ORAL | Status: DC

## 2013-06-07 MED ORDER — VITAMIN D (ERGOCALCIFEROL) 1.25 MG (50000 UNIT) PO CAPS: ORAL_CAPSULE | ORAL | Status: DC

## 2013-06-07 NOTE — Telephone Encounter (Signed)
Patient is calling to request refills on her omeprazole (PRILOSEC) 20 MG capsule and Vitamin D rx. These were formerly prescribed by Webb Silversmith, NP. Please advise.

## 2013-06-07 NOTE — Telephone Encounter (Signed)
[name], These are fine to refill.

## 2013-06-07 NOTE — Telephone Encounter (Signed)
called pt no answer LMOM refills sent to pharmacy...[name]

## 2013-06-09 NOTE — Telephone Encounter (Signed)
Left detailed message on VM letting pt know that she needed labs to check vit d as well as she should not be taking Axid and Prilosec together as instructed

## 2013-07-03 ENCOUNTER — Ambulatory Visit (INDEPENDENT_AMBULATORY_CARE_PROVIDER_SITE_OTHER): Payer: PRIVATE HEALTH INSURANCE

## 2013-07-03 DIAGNOSIS — Z7901 Long term (current) use of anticoagulants: Secondary | ICD-10-CM

## 2013-07-03 DIAGNOSIS — I4891 Unspecified atrial fibrillation: Secondary | ICD-10-CM

## 2013-07-03 DIAGNOSIS — Z5181 Encounter for therapeutic drug level monitoring: Secondary | ICD-10-CM

## 2013-07-03 LAB — POCT INR: INR: 2.7

## 2013-07-17 ENCOUNTER — Encounter: Payer: Self-pay | Admitting: Internal Medicine

## 2013-07-17 ENCOUNTER — Ambulatory Visit (INDEPENDENT_AMBULATORY_CARE_PROVIDER_SITE_OTHER): Payer: PRIVATE HEALTH INSURANCE | Admitting: Internal Medicine

## 2013-07-17 VITALS — BP 122/68 | HR 69 | Temp 97.9°F | Resp 12 | Wt 213.0 lb

## 2013-07-17 DIAGNOSIS — E119 Type 2 diabetes mellitus without complications: Secondary | ICD-10-CM

## 2013-07-17 MED ORDER — GABAPENTIN 400 MG PO CAPS: 400.0000 mg | ORAL_CAPSULE | Freq: Two times a day (BID) | ORAL | Status: DC

## 2013-07-17 NOTE — Progress Notes (Signed)
Patient ID: [name], female   DOB: Oct 17, 1945, 68 y.o.   MRN: [id number]   HPI: [name] is a 68 y.o.-year-old female, returning for f/u for DM2, insulin-dependent, uncontrolled, with complications (CAD, ICM - had ICD, peripheral neuropathy, gastroparesis). Last visit 3 mo ago.  Last hemoglobin A1c was: 07/03/2013: 7.2% - through her insurance  Lab Results  Component Value Date   HGBA1C 8.2* 04/03/2013   HGBA1C 9.1* 12/15/2012   HGBA1C 9.6* 07/18/2012   Pt is on a regimen of: - Levemir 50 units in am and 55 in hs - pen  - Apidra 14-16-20 (depending on size of meal) with a meal - vial - + added SSI at last visit target 150, ISF 15  Pt checks her sugars 3-4 a day and they are - brings a good log: - am: low 100-low 200s >> 148-234 >> 170-268 >> 141-238 >> 180-214 >> 134-271 - before lunch: 180s >> 68-200 >> 68-330 (130-175) >> 56 (one value) 100-215 >> 103-256 (most <170) >> 63, 76, 140-225 - after lunch: 55-171 >> n/c >> 100 >> n/c >> n/c - before dinner: 90s-150 >> 61-188 >> 69-277 (120-180) >> 84-197 (best sugars) >> 64-206 (most 100-170) >> 70, 86, 99-224 - bedtime: 100-150 >>112-279 >> 92-249 >> 110-230 >> 92-211 (most 130-180), one 321 >> 85-265 Lowest sugar was 56 >> 60 >> 63; she has hypoglycemia awareness at 70. Highest sugar was 544 >> 358 x 1, 321 x1 >> 405x1  Pt's meals are: - Breakfast: small bowl of cereal frosted with 2%  - Lunch: vegetable + starch; turkey/ham sandwich - Dinner: fast food or cooks: meat + vegetables + sometimes potatoes - Snacks: fruit (apple) Did not see nutrition yet >> dd not have transportation She has a glass of 2% milk before bedtime. Tried Almond milk but did not like it as it was too sweet.  - Has CKD, last BUN/creatinine:  Lab Results  Component Value Date   BUN 25* 05/17/2013   CREATININE 1.4* 05/17/2013  ACR in 12/15/2012: 1.0.  - last set of lipids: Lab Results  Component Value Date   CHOL 154 12/15/2012   HDL 54.10  12/15/2012   LDLCALC 75 12/15/2012   TRIG 127.0 12/15/2012   CHOLHDL 3 12/15/2012  she is on Crestor. - last eye exam was in 10/2012. No DR.  - + numbness and tingling in her feet - on neurontin  She is supposed to have left knee arthroscopy, but needs to get HbA1c <8%.   I reviewed pt's medications, allergies, PMH, social hx, family hx and no changes required, except as mentioned above.  ROS: Constitutional: + weight gain, no fatigue, no subjective hyperthermia/hypothermia Eyes: no blurry vision, no xerophthalmia ENT: no sore throat, no nodules palpated in throat, no dysphagia/odynophagia, no hoarseness; + tinnitus Cardiovascular: no CP/SOB/no palpitations/+ leg swelling Respiratory: no cough/SOB Gastrointestinal: +N/+V/+D/no C Musculoskeletal: no muscle/joint aches Skin: no rashes, + excessive hair growth  PE: BP 122/68  Pulse 69  Temp(Src) 97.9 F (36.6 C) (Oral)  Resp 12  Wt 213 lb (96.616 kg)  SpO2 95% Body mass index is 37.74 kg/(m^2).  Wt Readings from Last 3 Encounters:  07/17/13 213 lb (96.616 kg)  06/05/13 214 lb (97.07 kg)  05/17/13 213 lb 12.8 oz (96.979 kg)   Constitutional: overweight, in NAD, full supraclavicular fat pads Eyes: PERRLA, EOMI, no exophthalmos ENT: moist mucous membranes, no thyromegaly, no cervical lymphadenopathy Cardiovascular: RRR, No MRG, + pitting LE edema Respiratory: CTA B  Gastrointestinal: abdomen soft, NT, ND, BS+ Musculoskeletal: no deformities, strength intact in all 4 Skin: moist, warm, no rashes  ASSESSMENT: 1. DM2, insulin-dependent, uncontrolled, with complications - CAD, ICM - s/p ICD placement - CKD - PN - on neurontin - gastroparesis per GES 06/18/2012 >> 60 minutes: 92%, 120 minutes: 82%  PLAN:  1. Patient with long-standing, uncontrolled diabetes, on basal bolus regimen, with still higher sugars in am and slightly better sugars during the day. - she did not see nutrition yet - We discussed about options for  treatment, and I suggested to:    Patient Instructions  Continue Levemir to 50 units in am and 55 units before bedtime. Continue Apidra: 14 units for a smaller meal  16 units for a medium meal 20 units before a large meal or dinner - may need to increase to 22 with dinner, if needed Continue following sliding scale: - 150- 165: + 1 unit  - 166- 180: + 2 units  - 181- 195: + 3 units  - 196- 210: + 4 units  - >210: + 5 units   Please return in 3 months with your sugar log.   - continue checking her sugars at different times of the day - check 4 times a day - up to date with eye exams - given new sugar logs - will check a HbA1c at next visit - Return to clinic in 3 months with sugar log

## 2013-07-17 NOTE — Patient Instructions (Signed)
Continue Levemir to 50 units in am and 55 units before bedtime. Continue Apidra: 14 units for a smaller meal  16 units for a medium meal 20 units before a large meal or dinner - may need to increase to 22 with dinner, if needed Continue following sliding scale: - 150- 165: + 1 unit  - 166- 180: + 2 units  - 181- 195: + 3 units  - 196- 210: + 4 units  - >210: + 5 units   Please return in 3 months with your sugar log.

## 2013-07-25 ENCOUNTER — Other Ambulatory Visit: Payer: Self-pay | Admitting: *Deleted

## 2013-07-25 ENCOUNTER — Other Ambulatory Visit: Payer: Self-pay | Admitting: Internal Medicine

## 2013-07-25 MED ORDER — INSULIN DETEMIR 100 UNIT/ML FLEXPEN: PEN_INJECTOR | SUBCUTANEOUS | Status: DC

## 2013-07-25 NOTE — Telephone Encounter (Signed)
Last office visit was 07/17/13--please advise if okay to refill

## 2013-07-30 ENCOUNTER — Other Ambulatory Visit: Payer: Self-pay | Admitting: Cardiology

## 2013-07-31 ENCOUNTER — Encounter (HOSPITAL_COMMUNITY): Payer: Self-pay | Admitting: Emergency Medicine

## 2013-07-31 ENCOUNTER — Emergency Department (HOSPITAL_COMMUNITY): Payer: PRIVATE HEALTH INSURANCE

## 2013-07-31 ENCOUNTER — Ambulatory Visit (INDEPENDENT_AMBULATORY_CARE_PROVIDER_SITE_OTHER): Payer: PRIVATE HEALTH INSURANCE

## 2013-07-31 ENCOUNTER — Emergency Department (INDEPENDENT_AMBULATORY_CARE_PROVIDER_SITE_OTHER)
Admission: EM | Admit: 2013-07-31 | Discharge: 2013-07-31 | Disposition: A | Discharge status: Nursing Home (Includes ICF) | Payer: PRIVATE HEALTH INSURANCE | Source: Home / Self Care | Attending: Family Medicine | Admitting: Family Medicine

## 2013-07-31 ENCOUNTER — Emergency Department (INDEPENDENT_AMBULATORY_CARE_PROVIDER_SITE_OTHER): Payer: PRIVATE HEALTH INSURANCE

## 2013-07-31 ENCOUNTER — Emergency Department (HOSPITAL_COMMUNITY)
Admission: EM | Admit: 2013-07-31 | Discharge: 2013-07-31 | Disposition: A | Discharge status: Home / Self Care | Payer: PRIVATE HEALTH INSURANCE | Attending: Emergency Medicine | Admitting: Emergency Medicine

## 2013-07-31 DIAGNOSIS — R1012 Left upper quadrant pain: Secondary | ICD-10-CM | POA: Insufficient documentation

## 2013-07-31 DIAGNOSIS — R197 Diarrhea, unspecified: Secondary | ICD-10-CM

## 2013-07-31 DIAGNOSIS — N179 Acute kidney failure, unspecified: Secondary | ICD-10-CM

## 2013-07-31 DIAGNOSIS — R109 Unspecified abdominal pain: Secondary | ICD-10-CM

## 2013-07-31 DIAGNOSIS — Z87891 Personal history of nicotine dependence: Secondary | ICD-10-CM | POA: Insufficient documentation

## 2013-07-31 DIAGNOSIS — Z90710 Acquired absence of both cervix and uterus: Secondary | ICD-10-CM | POA: Insufficient documentation

## 2013-07-31 DIAGNOSIS — Z7901 Long term (current) use of anticoagulants: Secondary | ICD-10-CM | POA: Insufficient documentation

## 2013-07-31 DIAGNOSIS — I129 Hypertensive chronic kidney disease with stage 1 through stage 4 chronic kidney disease, or unspecified chronic kidney disease: Secondary | ICD-10-CM | POA: Insufficient documentation

## 2013-07-31 DIAGNOSIS — Z95810 Presence of automatic (implantable) cardiac defibrillator: Secondary | ICD-10-CM | POA: Insufficient documentation

## 2013-07-31 DIAGNOSIS — I4891 Unspecified atrial fibrillation: Secondary | ICD-10-CM | POA: Insufficient documentation

## 2013-07-31 DIAGNOSIS — Z8739 Personal history of other diseases of the musculoskeletal system and connective tissue: Secondary | ICD-10-CM | POA: Insufficient documentation

## 2013-07-31 DIAGNOSIS — I2510 Atherosclerotic heart disease of native coronary artery without angina pectoris: Secondary | ICD-10-CM | POA: Insufficient documentation

## 2013-07-31 DIAGNOSIS — Z5181 Encounter for therapeutic drug level monitoring: Secondary | ICD-10-CM

## 2013-07-31 DIAGNOSIS — R112 Nausea with vomiting, unspecified: Secondary | ICD-10-CM

## 2013-07-31 DIAGNOSIS — Z9851 Tubal ligation status: Secondary | ICD-10-CM | POA: Insufficient documentation

## 2013-07-31 DIAGNOSIS — E109 Type 1 diabetes mellitus without complications: Secondary | ICD-10-CM | POA: Insufficient documentation

## 2013-07-31 DIAGNOSIS — Z794 Long term (current) use of insulin: Secondary | ICD-10-CM | POA: Insufficient documentation

## 2013-07-31 DIAGNOSIS — R1032 Left lower quadrant pain: Secondary | ICD-10-CM | POA: Insufficient documentation

## 2013-07-31 DIAGNOSIS — N189 Chronic kidney disease, unspecified: Secondary | ICD-10-CM | POA: Insufficient documentation

## 2013-07-31 DIAGNOSIS — Z8619 Personal history of other infectious and parasitic diseases: Secondary | ICD-10-CM | POA: Insufficient documentation

## 2013-07-31 DIAGNOSIS — Z79899 Other long term (current) drug therapy: Secondary | ICD-10-CM | POA: Insufficient documentation

## 2013-07-31 DIAGNOSIS — Z8673 Personal history of transient ischemic attack (TIA), and cerebral infarction without residual deficits: Secondary | ICD-10-CM | POA: Insufficient documentation

## 2013-07-31 DIAGNOSIS — E785 Hyperlipidemia, unspecified: Secondary | ICD-10-CM | POA: Insufficient documentation

## 2013-07-31 DIAGNOSIS — I5022 Chronic systolic (congestive) heart failure: Secondary | ICD-10-CM | POA: Insufficient documentation

## 2013-07-31 LAB — CBC WITH DIFFERENTIAL/PLATELET
BASOS ABS: 0 10*3/uL (ref 0.0–0.1)
BASOS PCT: 0 % (ref 0–1)
Basophils Absolute: 0 10*3/uL (ref 0.0–0.1)
Basophils Relative: 0 % (ref 0–1)
EOS ABS: 0.1 10*3/uL (ref 0.0–0.7)
EOS ABS: 0.1 10*3/uL (ref 0.0–0.7)
EOS PCT: 2 % (ref 0–5)
Eosinophils Relative: 2 % (ref 0–5)
HCT: 40.4 % (ref 36.0–46.0)
HEMATOCRIT: 41.7 % (ref 36.0–46.0)
HEMOGLOBIN: 13.7 g/dL (ref 12.0–15.0)
Hemoglobin: 13.3 g/dL (ref 12.0–15.0)
Lymphocytes Relative: 21 % (ref 12–46)
Lymphocytes Relative: 27 % (ref 12–46)
Lymphs Abs: 1.2 10*3/uL (ref 0.7–4.0)
Lymphs Abs: 1.5 10*3/uL (ref 0.7–4.0)
MCH: 29.8 pg (ref 26.0–34.0)
MCH: 30 pg (ref 26.0–34.0)
MCHC: 32.9 g/dL (ref 30.0–36.0)
MCHC: 32.9 g/dL (ref 30.0–36.0)
MCV: 90.8 fL (ref 78.0–100.0)
MCV: 91 fL (ref 78.0–100.0)
MONO ABS: 0.6 10*3/uL (ref 0.1–1.0)
Monocytes Absolute: 0.6 10*3/uL (ref 0.1–1.0)
Monocytes Relative: 10 % (ref 3–12)
Monocytes Relative: 11 % (ref 3–12)
NEUTROS ABS: 3.3 10*3/uL (ref 1.7–7.7)
Neutro Abs: 3.7 10*3/uL (ref 1.7–7.7)
Neutrophils Relative %: 67 % (ref 43–77)
Neutrophils Relative %: 60 % (ref 43–77)
Platelets: 238 10*3/uL (ref 150–400)
Platelets: 200 10*3/uL (ref 150–400)
RBC: 4.59 MIL/uL (ref 3.87–5.11)
RBC: 4.44 MIL/uL (ref 3.87–5.11)
RDW: 14.8 % (ref 11.5–15.5)
RDW: 14.8 % (ref 11.5–15.5)
WBC: 5.6 10*3/uL (ref 4.0–10.5)
WBC: 5.4 10*3/uL (ref 4.0–10.5)

## 2013-07-31 LAB — URINE MICROSCOPIC-ADD ON

## 2013-07-31 LAB — URINALYSIS, ROUTINE W REFLEX MICROSCOPIC
Bilirubin Urine: NEGATIVE
Glucose, UA: NEGATIVE mg/dL
HGB URINE DIPSTICK: NEGATIVE
KETONES UR: 15 mg/dL — AB
Nitrite: NEGATIVE
PH: 5.5 (ref 5.0–8.0)
PROTEIN: 30 mg/dL — AB
Specific Gravity, Urine: 1.026 (ref 1.005–1.030)
Urobilinogen, UA: 1 mg/dL (ref 0.0–1.0)

## 2013-07-31 LAB — COMPREHENSIVE METABOLIC PANEL
ALBUMIN: 3.3 g/dL — AB (ref 3.5–5.2)
ALT: 57 U/L — ABNORMAL HIGH (ref 0–35)
ALT: 52 U/L — ABNORMAL HIGH (ref 0–35)
AST: 47 U/L — AB (ref 0–37)
AST: 41 U/L — AB (ref 0–37)
Albumin: 3.5 g/dL (ref 3.5–5.2)
Alkaline Phosphatase: 71 U/L (ref 39–117)
Alkaline Phosphatase: 68 U/L (ref 39–117)
BILIRUBIN TOTAL: 0.6 mg/dL (ref 0.3–1.2)
BUN: 32 mg/dL — AB (ref 6–23)
BUN: 32 mg/dL — ABNORMAL HIGH (ref 6–23)
CALCIUM: 8.6 mg/dL (ref 8.4–10.5)
CO2: 22 meq/L (ref 19–32)
CO2: 21 mEq/L (ref 19–32)
CREATININE: 1.95 mg/dL — AB (ref 0.50–1.10)
CREATININE: 1.88 mg/dL — AB (ref 0.50–1.10)
Calcium: 9 mg/dL (ref 8.4–10.5)
Chloride: 104 mEq/L (ref 96–112)
Chloride: 108 mEq/L (ref 96–112)
GFR calc Af Amer: 30 mL/min — ABNORMAL LOW (ref >90)
GFR calc Af Amer: 31 mL/min — ABNORMAL LOW (ref >90)
GFR calc non Af Amer: 27 mL/min — ABNORMAL LOW (ref >90)
GFR, EST NON AFRICAN AMERICAN: 26 mL/min — AB (ref >90)
Glucose, Bld: 85 mg/dL (ref 70–99)
Glucose, Bld: 60 mg/dL — ABNORMAL LOW (ref 70–99)
Potassium: 3.7 mEq/L (ref 3.7–5.3)
Potassium: 3.7 mEq/L (ref 3.7–5.3)
Sodium: 142 mEq/L (ref 137–147)
Sodium: 144 mEq/L (ref 137–147)
TOTAL PROTEIN: 6.9 g/dL (ref 6.0–8.3)
Total Bilirubin: 0.6 mg/dL (ref 0.3–1.2)
Total Protein: 7.4 g/dL (ref 6.0–8.3)

## 2013-07-31 LAB — I-STAT VENOUS BLOOD GAS, ED
ACID-BASE DEFICIT: 2 mmol/L (ref 0.0–2.0)
BICARBONATE: 22.3 meq/L (ref 20.0–24.0)
O2 Saturation: 89 %
PO2 VEN: 56 mmHg — AB (ref 30.0–45.0)
TCO2: 23 mmol/L (ref 0–100)
pCO2, Ven: 34.3 mmHg — ABNORMAL LOW (ref 45.0–50.0)
pH, Ven: 7.421 — ABNORMAL HIGH (ref 7.250–7.300)

## 2013-07-31 LAB — OCCULT BLOOD, POC DEVICE: FECAL OCCULT BLD: NEGATIVE

## 2013-07-31 LAB — POCT INR: INR: 3.1

## 2013-07-31 LAB — LIPASE, BLOOD
LIPASE: 33 U/L (ref 11–59)
Lipase: 33 U/L (ref 11–59)

## 2013-07-31 LAB — PRO B NATRIURETIC PEPTIDE: PRO B NATRI PEPTIDE: 149.3 pg/mL — AB (ref 0–125)

## 2013-07-31 MED ORDER — HYDROMORPHONE HCL PF 1 MG/ML IJ SOLN
0.5000 mg | Freq: Once | INTRAMUSCULAR | Status: AC
  Administered 2013-07-31: 0.5 mg via INTRAVENOUS
  Filled 2013-07-31: qty 1

## 2013-07-31 MED ORDER — ONDANSETRON HCL 4 MG/2ML IJ SOLN
4.0000 mg | Freq: Once | INTRAMUSCULAR | Status: AC
  Filled 2013-07-31: qty 2

## 2013-07-31 MED ORDER — ONDANSETRON HCL 4 MG/2ML IJ SOLN
INTRAMUSCULAR | Status: AC
  Filled 2013-07-31: qty 2

## 2013-07-31 MED ORDER — SODIUM CHLORIDE 0.9 % IV BOLUS (SEPSIS)
500.0000 mL | Freq: Once | INTRAVENOUS | Status: AC
  Administered 2013-07-31: 500 mL via INTRAVENOUS

## 2013-07-31 MED ORDER — SODIUM CHLORIDE 0.9 % IV SOLN
1000.0000 mL | INTRAVENOUS | Status: DC
  Administered 2013-07-31: 1000 mL via INTRAVENOUS

## 2013-07-31 MED ORDER — ONDANSETRON HCL 4 MG/2ML IJ SOLN
4.0000 mg | Freq: Once | INTRAMUSCULAR | Status: AC
  Administered 2013-07-31: 4 mg via INTRAVENOUS

## 2013-07-31 MED ORDER — ONDANSETRON 4 MG PO TBDP: 4.0000 mg | ORAL_TABLET | Freq: Three times a day (TID) | ORAL | Status: DC | PRN

## 2013-07-31 MED ORDER — IOHEXOL 300 MG/ML  SOLN: 20.0000 mL | INTRAMUSCULAR | Status: AC

## 2013-07-31 NOTE — ED Notes (Signed)
Pt back from CT

## 2013-07-31 NOTE — ED Notes (Signed)
Pt triaged and assessed by provider.   Provider in before nurse. 

## 2013-07-31 NOTE — ED Notes (Signed)
Patient asked for urine sample.She is unable to give sample at this time.

## 2013-07-31 NOTE — Discharge Instructions (Signed)
As discussed, your evaluation today has been largely reassuring.  But, it is important that you monitor your condition carefully, and do not hesitate to return to the ED if you develop new, or concerning changes in your condition. ° °Otherwise, please follow-up with your physician for appropriate ongoing care. ° ° ° °Abdominal Pain, Adult °Many things can cause abdominal pain. Usually, abdominal pain is not caused by a disease and will improve without treatment. It can often be observed and treated at home. Your health care provider will do a physical exam and possibly order blood tests and X-rays to help determine the seriousness of your pain. However, in many cases, more time must pass before a clear cause of the pain can be found. Before that point, your health care provider may not know if you need more testing or further treatment. °HOME CARE INSTRUCTIONS  °Monitor your abdominal pain for any changes. The following actions may help to alleviate any discomfort you are experiencing: °· Only take over-the-counter or prescription medicines as directed by your health care provider. °· Do not take laxatives unless directed to do so by your health care provider. °· Try a clear liquid diet (broth, tea, or water) as directed by your health care provider. Slowly move to a bland diet as tolerated. °SEEK MEDICAL CARE IF: °· You have unexplained abdominal pain. °· You have abdominal pain associated with nausea or diarrhea. °· You have pain when you urinate or have a bowel movement. °· You experience abdominal pain that wakes you in the night. °· You have abdominal pain that is worsened or improved by eating food. °· You have abdominal pain that is worsened with eating fatty foods. °SEEK IMMEDIATE MEDICAL CARE IF:  °· Your pain does not go away within 2 hours. °· You have a fever. °· You keep throwing up (vomiting). °· Your pain is felt only in portions of the abdomen, such as the right side or the left lower portion of the  abdomen. °· You pass bloody or black tarry stools. °MAKE SURE YOU: °· Understand these instructions.   °· Will watch your condition.   °· Will get help right away if you are not doing well or get worse.   °Document Released: 12/17/2004 Document Revised: 12/28/2012 Document Reviewed: 11/16/2012 °[employer]® Patient Information ©2014 [employer]. ° °

## 2013-07-31 NOTE — ED Notes (Signed)
Pt taken to CT.

## 2013-07-31 NOTE — ED Provider Notes (Signed)
CSN: [id number]     Arrival date & time 07/31/13  1704 History   First MD Initiated Contact with Patient 07/31/13 1705     Chief Complaint  Patient presents with  . Emesis  . Diarrhea     HPI  Patient presents with concern of abdominal pain nausea, vomiting, diarrhea. Symptoms began 3 days ago without clear precipitant.  Since onset symptoms have been progressive, with generalized discomfort as well. There is no new dyspnea or chest pain, fever, chills. Symptoms have progressed despite using OTC medication. No no sick contacts, travel, activities, medication.   Past Medical History  Diagnosis Date  . Chronic systolic heart failure   . Diabetes mellitus     type 1  . Hypertension   . Stroke 2010    eye doctor said she had TIA  . Ischemic cardiomyopathy   . Ventricular tachycardia     Polymorphic  . 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   . Atrial fibrillation     controlled with amiodarone, on coumadin  . Coronary artery disease 01/31/2011  . Chronic renal insufficiency   . Lumbar spondylosis 01/11/2012  . History of chicken pox   . Allergy   . Hyperlipidemia    Past Surgical History  Procedure Laterality Date  . Abdominal hysterectomy  1995  . Cholecystectomy  1985  . Tubaligation  1980  . Icd  2003/2007    implanted by Dr Rollene Fare, most recent generator change 2/13 by Dr Rayann Heman, Analyze ST study patient   Family History  Problem Relation Age of Onset  . Diabetes Mother   . Heart disease Mother   . Hyperlipidemia Mother   . Hypertension Mother   . Heart disease Father   . Heart attack Father   . Hypertension Father   . Early death Brother 41   History  Substance Use Topics  . Smoking status: Former Smoker    Quit date: 08/16/1995  . Smokeless tobacco: Never Used  . Alcohol Use: No   OB History   Grav Para Term Preterm Abortions TAB SAB Ect Mult Living                 Review of Systems  Constitutional:       Per HPI, otherwise negative   HENT:       Per HPI, otherwise negative  Respiratory:       Per HPI, otherwise negative  Cardiovascular:       Per HPI, otherwise negative  Gastrointestinal: Positive for vomiting and diarrhea.  Endocrine:       Negative aside from HPI  Genitourinary:       Neg aside from HPI   Musculoskeletal:       Per HPI, otherwise negative  Skin: Negative.   Neurological: Negative for syncope.      Allergies  Darvon; Promethazine hcl; and Plavix  Home Medications   Prior to Admission medications   Medication Sig Start Date End Date Taking? Authorizing Provider  amiodarone (PACERONE) 200 MG tablet Take 200 mg by mouth daily.   Yes Historical Provider, MD  carvedilol (COREG) 25 MG tablet Take 25 mg by mouth 2 (two) times daily with a meal.   Yes Historical Provider, MD  Cholecalciferol (VITAMIN D3) 2000 UNITS TABS Take 2,000 Units by mouth daily.    Yes Historical Provider, MD  furosemide (LASIX) 20 MG tablet Take 1 tablet (20 mg total) by mouth daily. 03/20/13  Yes Larey Dresser, MD  gabapentin (NEURONTIN) 400  MG capsule Take 1 capsule (400 mg total) by mouth 2 (two) times daily. 07/17/13  Yes Philemon Kingdom, MD  Insulin Detemir (LEVEMIR FLEXTOUCH) 100 UNIT/ML Pen Inject 50-55 Units into the skin 2 (two) times daily. 50 units in the morning and 55 units at bedtime   Yes Historical Provider, MD  insulin glulisine (APIDRA) 100 UNIT/ML injection Inject 14-22 Units into the skin 3 (three) times daily with meals. Starts at 14 units with a meal and can go up to 22 units depending on meal size Sliding scale 04/03/13  Yes Philemon Kingdom, MD  lisinopril (PRINIVIL,ZESTRIL) 5 MG tablet Take 5 mg by mouth daily.   Yes Historical Provider, MD  Multiple Vitamins-Minerals (EYE VITAMINS PO) Take 1 tablet by mouth daily.    Yes Historical Provider, MD  nitroGLYCERIN (NITROSTAT) 0.4 MG SL tablet Place 0.4 mg under the tongue every 5 (five) minutes as needed for chest pain. 07/08/12  Yes Larey Dresser, MD   nizatidine (AXID) 150 MG capsule Take 150 mg by mouth daily.   Yes Historical Provider, MD  omeprazole (PRILOSEC) 20 MG capsule Take 1 capsule (20 mg total) by mouth daily. 06/07/13  Yes Stacy Gardner, PA-C  rosuvastatin (CRESTOR) 20 MG tablet Take 20 mg by mouth daily.   Yes Historical Provider, MD  Vitamin D, Ergocalciferol, (DRISDOL) 50000 UNITS CAPS capsule Take 50,000 Units by mouth every 7 (seven) days. monday   Yes Historical Provider, MD  warfarin (COUMADIN) 2.5 MG tablet Take 1.25 mg by mouth daily. 1/2 tablet daily (1.$RemoveBeforeD'25mg'lyPFHSAxRgipaO$ )   Yes Historical Provider, MD  ACCU-CHEK SMARTVIEW test strip  03/13/13   Historical Provider, MD  Blood Glucose Monitoring Suppl (ACCU-CHEK NANO SMARTVIEW) W/DEVICE KIT  03/13/13   Historical Provider, MD  Blood Glucose Monitoring Suppl (ACCU-CHEK NANO SMARTVIEW) W/DEVICE KIT USE AS DIRECTED 06/06/13   Philemon Kingdom, MD   BP 121/55  Pulse 68  Temp(Src) 97.8 F (36.6 C) (Oral)  Resp 16  Ht $R'5\' 3"'xz$  (1.6 m)  Wt 208 lb 12.4 oz (94.7 kg)  BMI 36.99 kg/m2  SpO2 96% Physical Exam  Nursing note and vitals reviewed. Constitutional: She is oriented to person, place, and time. She appears well-developed and well-nourished. No distress.  HENT:  Head: Normocephalic and atraumatic.  Eyes: Conjunctivae and EOM are normal.  Cardiovascular: Normal rate and regular rhythm.   Pulmonary/Chest: Effort normal and breath sounds normal. No stridor. No respiratory distress.  Abdominal: She exhibits no distension. There is tenderness in the left upper quadrant and left lower quadrant. There is no rigidity, no rebound and no guarding.  Musculoskeletal: She exhibits no edema.  Neurological: She is alert and oriented to person, place, and time. No cranial nerve deficit.  Skin: Skin is warm and dry.  Psychiatric: She has a normal mood and affect.    ED Course  Procedures (including critical care time) Labs Review Labs Reviewed  CBC WITH DIFFERENTIAL  COMPREHENSIVE METABOLIC  PANEL  LIPASE, BLOOD  URINALYSIS, ROUTINE W REFLEX MICROSCOPIC  BLOOD GAS, VENOUS    Imaging Review Dg Abd Acute W/chest  07/31/2013   CLINICAL DATA:  Nausea and vomiting and dark stools with left-sided abdominal pain; history of previous cholecystectomy, diabetes, and hypertension  EXAM: ACUTE ABDOMEN SERIES (ABDOMEN 2 VIEW & CHEST 1 VIEW)  COMPARISON:  DG ABD 2 VIEWS dated 05/17/2013; CT T SPINE W/O CM dated 08/18/2012; CT L SPINE W/O CM dated 01/19/2012; DG THORACIC SPINE dated 08/11/2012  FINDINGS: The chest film reveals the lungs be well-expanded  and clear. The cardiopericardial silhouette is normal in size. A permanent pacemaker defibrillator is in place. Within the abdomen there is a moderate stool burden throughout the colon. There are small amounts of fluid and air in the right colon. There is no evidence of a small bowel obstruction. No free extraluminal gas collections are demonstrated. There are surgical clips in the gallbladder fossa. There are rounded calcifications in the paravertebral regions bilaterally. On the left this lies at the L3-4 disc level and on the right at the L4-5 disc level. Review of the CT scan dated January 19, 2012 reveals similar calcifications that appear to reflect phleboliths within gonadal veins. There is calcification wall of the abdominal aorta. There is a stent in the left renal artery proximally. There are degenerative changes of the lumbar spine.  IMPRESSION: 1. There is no evidence of bowel obstruction or ileus. There is a small amount of fluid in the right colon and a moderate stool burden within the colon. 2. There is no evidence of CHF nor pneumonia. A rounded density in the right hilar region likely reflects a prominent central pulmonary vessel and has not clearly changed since the study of Aug 11, 2012.   Electronically Signed   By: David  Martinique   On: 07/31/2013 15:26     EKG Interpretation   Date/Time:  Monday Jul 31 2013 17:18:19 EDT Ventricular Rate:   62 PR Interval:  238 QRS Duration: 116 QT Interval:  515 QTC Calculation: 523 R Axis:   -35 Text Interpretation:  Sinus rhythm Prolonged PR interval Left ventricular  hypertrophy Inferior infarct, age indeterminate Prolonged QT interval  Sinus rhythm Left ventricular hypertrophy Artifact QT prolonged Abnormal  ekg Confirmed by Carmin Muskrat  MD ([id number]) on 07/31/2013 6:09:55 PM      O2- 99%ra, nml     I reviewed the EMR  9:42 PM  on repeat exam the patient is in no distress.  She has no ongoing complaints. Discussed all findings with her and her family members.  Patient's evaluation is largely reassuring. MDM   Patient presents with nausea, vomiting, abdominal pain.  Patient's evaluation here is largely reassuring, though the patient does have some elements of chronic disease. Patient had no new complaints, improved here, was appropriate for further evaluation and management as an outpatient after a period of monitoring her with no decompensation.  Carmin Muskrat, MD 07/31/13 2142

## 2013-07-31 NOTE — ED Notes (Signed)
Sent from Urgent Care for nausea, emesis, and diarrhea for 3 days. Abnormal lab creatine elevated. Patient alert answering and following commands appropriate.

## 2013-07-31 NOTE — ED Notes (Signed)
Pt finished drinking contrast. CT called.

## 2013-07-31 NOTE — ED Notes (Signed)
Family updated on time for CT

## 2013-07-31 NOTE — ED Provider Notes (Signed)
CSN: [id number]     Arrival date & time 07/31/13  1258 History   First MD Initiated Contact with Patient 07/31/13 1420     Chief Complaint  Patient presents with  . Abdominal Pain   (Consider location/radiation/quality/duration/timing/severity/associated sxs/prior Treatment) HPI Comments: 68 year old female with history of type 2 diabetes, chronic heart failure, chronic kidney disease, atrial fibrillation, coronary artery disease, ischemic cardiomyopathy, with pacemaker/ICD, presents for evaluation of nausea, vomiting, diarrhea, abdominal pain. Her symptoms began on Friday with nausea and diarrhea. Her diarrhea started out as stool colored and has since become black. She denies taking any Pepto-Bismol to help her symptoms. She last had diarrhea yesterday, has not had a bowel movement today. She started to have vomiting Saturday morning and developed abdominal pain soon thereafter. She has nausea and vomiting after any attempted oral intake. The abdominal pain is significantly better today than yesterday, but she has recurrence of the pain whenever she eats anything. She takes daily weights because of her diagnosis of heart failure, up until yesterday she had lost 4 pounds, but she gained 1 pound today. She is still having a normal amount of urine output. She has no increase or decrease in her usual amount of leg swelling. She denies any chest pain or shortness of breath at this time. She denies any cough or other respiratory symptoms. She has no recent travel or sick contacts. She has been trying to keep up her oral intake of fluids but this is difficult due to the abdominal pain and the nausea with vomiting. She also notes that she has been told in the past that she has ulcers. She has a colonoscopy 6 years ago that was normal as far she remembers.   Past Medical History  Diagnosis Date  . Chronic systolic heart failure   . Diabetes mellitus     type 1  . Hypertension   . Stroke 2010    eye doctor  said she had TIA  . Ischemic cardiomyopathy   . Ventricular tachycardia     Polymorphic  . 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   . Atrial fibrillation     controlled with amiodarone, on coumadin  . Coronary artery disease 01/31/2011  . Chronic renal insufficiency   . Lumbar spondylosis 01/11/2012  . History of chicken pox   . Allergy   . Hyperlipidemia    Past Surgical History  Procedure Laterality Date  . Abdominal hysterectomy  1995  . Cholecystectomy  1985  . Tubaligation  1980  . Icd  2003/2007    implanted by Dr Rollene Fare, most recent generator change 2/13 by Dr Rayann Heman, Analyze ST study patient   Family History  Problem Relation Age of Onset  . Diabetes Mother   . Heart disease Mother   . Hyperlipidemia Mother   . Hypertension Mother   . Heart disease Father   . Heart attack Father   . Hypertension Father   . Early death Brother 61   History  Substance Use Topics  . Smoking status: Former Smoker    Quit date: 08/16/1995  . Smokeless tobacco: Never Used  . Alcohol Use: No   OB History   Grav Para Term Preterm Abortions TAB SAB Ect Mult Living                 Review of Systems  Constitutional: Positive for appetite change, fatigue and unexpected weight change. Negative for fever and chills.  Respiratory: Negative for cough, chest tightness and shortness of breath.  Cardiovascular: Positive for leg swelling (chronic). Negative for chest pain.  Gastrointestinal: Positive for nausea, vomiting, abdominal pain and diarrhea. Negative for blood in stool.  Endocrine: Negative for polydipsia and polyuria.  Genitourinary: Negative for flank pain and decreased urine volume.  All other systems reviewed and are negative.   Allergies  Darvon; Promethazine hcl; and Plavix  Home Medications   Prior to Admission medications   Medication Sig Start Date End Date Taking? Authorizing Provider  ACCU-CHEK SMARTVIEW test strip  03/13/13   Historical Provider, MD   amiodarone (PACERONE) 200 MG tablet TAKE 1 TABLET (200 MG TOTAL) BY MOUTH DAILY. 09/20/12   Laurey Morale, MD  Blood Glucose Monitoring Suppl (ACCU-CHEK NANO SMARTVIEW) W/DEVICE KIT  03/13/13   Historical Provider, MD  Blood Glucose Monitoring Suppl (ACCU-CHEK NANO SMARTVIEW) W/DEVICE KIT USE AS DIRECTED 06/06/13   Carlus Pavlov, MD  carvedilol (COREG) 25 MG tablet TAKE 1 TABLET (25 MG TOTAL) BY MOUTH 2 (TWO) TIMES DAILY WITH A MEAL.    Laurey Morale, MD  cetirizine (ZYRTEC) 10 MG tablet Take 10 mg by mouth daily as needed for allergies.    Historical Provider, MD  Cholecalciferol (VITAMIN D3) 2000 UNITS TABS Take 2,000 Units by mouth daily.     Historical Provider, MD  diclofenac sodium (VOLTAREN) 1 % GEL Apply 2 g topically as needed. 08/11/12   Clydie Braun Prueter, PA-C  furosemide (LASIX) 20 MG tablet Take 1 tablet (20 mg total) by mouth daily. 03/20/13   Laurey Morale, MD  gabapentin (NEURONTIN) 400 MG capsule Take 1 capsule (400 mg total) by mouth 2 (two) times daily. 07/17/13   Carlus Pavlov, MD  Insulin Detemir (LEVEMIR FLEXTOUCH) 100 UNIT/ML Pen Inject under skin 50 units in am and 55 units in hs 07/25/13   Carlus Pavlov, MD  Insulin Detemir (LEVEMIR FLEXTOUCH) 100 UNIT/ML Pen Inject under skin 50 units in am and 55 units in pm 07/25/13   Carlus Pavlov, MD  Insulin Detemir (LEVEMIR) 100 UNIT/ML Pen Inject under skin 50 units in am and 55 units at night 12/27/12   Carlus Pavlov, MD  insulin glulisine (APIDRA) 100 UNIT/ML injection Sliding scale 04/03/13   Carlus Pavlov, MD  lisinopril (PRINIVIL,ZESTRIL) 5 MG tablet TAKE 1 TABLET BY MOUTH DAILY 02/11/13   Laurey Morale, MD  Multiple Vitamins-Minerals (EYE VITAMINS PO) Take by mouth daily.    Historical Provider, MD  nitrofurantoin, macrocrystal-monohydrate, (MACROBID) 100 MG capsule Take 1 capsule (100 mg total) by mouth 2 (two) times daily. 05/17/13   Baltazar Apo, PA-C  nitroGLYCERIN (NITROSTAT) 0.4 MG SL tablet Place 0.4 mg under  the tongue every 5 (five) minutes as needed for chest pain. 07/08/12   Laurey Morale, MD  nizatidine (AXID) 150 MG capsule TAKE ONE CAPSULE BY MOUTH EVERY DAY FOR STOMACH    Nicki Reaper, NP  omeprazole (PRILOSEC) 20 MG capsule Take 1 capsule (20 mg total) by mouth daily. 06/07/13   Baltazar Apo, PA-C  rosuvastatin (CRESTOR) 20 MG tablet TAKE 1 TABLET BY MOUTH EVERY DAY 04/28/13   Laurey Morale, MD  Vitamin D, Ergocalciferol, (DRISDOL) 50000 UNITS CAPS capsule TAKE ONE CAPSULE BY MOUTH ONCE A WEEK 06/07/13   Baltazar Apo, PA-C  warfarin (COUMADIN) 2.5 MG tablet TAKE AS DIRECTED BY COUMADIN CLINIC 02/11/13   Laurey Morale, MD   BP 90/56  Pulse 74  Temp(Src) 98 F (36.7 C) (Oral)  Resp 20  SpO2 98% Physical Exam  Nursing note and vitals reviewed. Constitutional:  She is oriented to person, place, and time. Vital signs are normal. She appears well-developed and well-nourished. No distress.  HENT:  Head: Normocephalic and atraumatic.  Neck: Normal range of motion. Neck supple. No JVD present. No tracheal deviation present.  Cardiovascular: Normal rate, regular rhythm and normal heart sounds.   Pulmonary/Chest: Effort normal and breath sounds normal. No respiratory distress.  Abdominal: Normal appearance. Bowel sounds are decreased. There is no hepatosplenomegaly. There is tenderness in the left lower quadrant. There is no rigidity, no rebound, no guarding, no CVA tenderness, no tenderness at McBurney's point and negative Murphy's sign.  Neurological: She is alert and oriented to person, place, and time. She has normal strength. Coordination normal.  Skin: Skin is warm and dry. No rash noted. She is not diaphoretic.  Psychiatric: She has a normal mood and affect. Judgment normal.    ED Course  Procedures (including critical care time) Labs Review Labs Reviewed  COMPREHENSIVE METABOLIC PANEL - Abnormal; Notable for the following:    BUN 32 (*)    Creatinine, Ser 1.95 (*)    AST 47 (*)     ALT 57 (*)    GFR calc non Af Amer 26 (*)    GFR calc Af Amer 30 (*)    All other components within normal limits  CBC WITH DIFFERENTIAL  LIPASE, BLOOD  OCCULT BLOOD X 1 CARD TO LAB, STOOL    Imaging Review Dg Abd Acute W/chest  07/31/2013   CLINICAL DATA:  Nausea and vomiting and dark stools with left-sided abdominal pain; history of previous cholecystectomy, diabetes, and hypertension  EXAM: ACUTE ABDOMEN SERIES (ABDOMEN 2 VIEW & CHEST 1 VIEW)  COMPARISON:  DG ABD 2 VIEWS dated 05/17/2013; CT T SPINE W/O CM dated 08/18/2012; CT L SPINE W/O CM dated 01/19/2012; DG THORACIC SPINE dated 08/11/2012  FINDINGS: The chest film reveals the lungs be well-expanded and clear. The cardiopericardial silhouette is normal in size. A permanent pacemaker defibrillator is in place. Within the abdomen there is a moderate stool burden throughout the colon. There are small amounts of fluid and air in the right colon. There is no evidence of a small bowel obstruction. No free extraluminal gas collections are demonstrated. There are surgical clips in the gallbladder fossa. There are rounded calcifications in the paravertebral regions bilaterally. On the left this lies at the L3-4 disc level and on the right at the L4-5 disc level. Review of the CT scan dated January 19, 2012 reveals similar calcifications that appear to reflect phleboliths within gonadal veins. There is calcification wall of the abdominal aorta. There is a stent in the left renal artery proximally. There are degenerative changes of the lumbar spine.  IMPRESSION: 1. There is no evidence of bowel obstruction or ileus. There is a small amount of fluid in the right colon and a moderate stool burden within the colon. 2. There is no evidence of CHF nor pneumonia. A rounded density in the right hilar region likely reflects a prominent central pulmonary vessel and has not clearly changed since the study of Aug 11, 2012.   Electronically Signed   By: David  Martinique    On: 07/31/2013 15:26   1350: EKG Unchanged from previous, nonischemic.  FOBT negative.  X-ray unremarkable.  Will give 500 mL bolus of normal saline and reassess awaiting lab results  MDM   1. Nausea vomiting and diarrhea   2. AKI (acute kidney injury)    Labs indicate acute kidney injury, patient has significant  comorbidities would benefit from prolonged rehydration. Also, not completely sure whether this is viral gastroenteritis or diverticulitis. White blood cell count and differential are still pending at the time of transfer     Liam Graham, PA-C 07/31/13 1637

## 2013-08-02 ENCOUNTER — Ambulatory Visit (HOSPITAL_COMMUNITY)
Admission: RE | Admit: 2013-08-02 | Discharge: 2013-08-02 | Disposition: A | Discharge status: Home / Self Care | Payer: PRIVATE HEALTH INSURANCE | Source: Ambulatory Visit | Attending: Internal Medicine | Admitting: Internal Medicine

## 2013-08-02 VITALS — BP 124/62 | HR 89 | Wt 211.0 lb

## 2013-08-02 DIAGNOSIS — N390 Urinary tract infection, site not specified: Secondary | ICD-10-CM

## 2013-08-02 DIAGNOSIS — I2510 Atherosclerotic heart disease of native coronary artery without angina pectoris: Secondary | ICD-10-CM | POA: Insufficient documentation

## 2013-08-02 DIAGNOSIS — I472 Ventricular tachycardia, unspecified: Secondary | ICD-10-CM | POA: Insufficient documentation

## 2013-08-02 DIAGNOSIS — I4729 Other ventricular tachycardia: Secondary | ICD-10-CM | POA: Insufficient documentation

## 2013-08-02 DIAGNOSIS — I2589 Other forms of chronic ischemic heart disease: Secondary | ICD-10-CM | POA: Insufficient documentation

## 2013-08-02 DIAGNOSIS — I255 Ischemic cardiomyopathy: Secondary | ICD-10-CM

## 2013-08-02 DIAGNOSIS — I5022 Chronic systolic (congestive) heart failure: Secondary | ICD-10-CM

## 2013-08-02 DIAGNOSIS — I4891 Unspecified atrial fibrillation: Secondary | ICD-10-CM | POA: Insufficient documentation

## 2013-08-02 DIAGNOSIS — I509 Heart failure, unspecified: Secondary | ICD-10-CM

## 2013-08-02 MED ORDER — SULFAMETHOXAZOLE-TMP DS 800-160 MG PO TABS: 1.0000 | ORAL_TABLET | Freq: Two times a day (BID) | ORAL | Status: DC

## 2013-08-02 NOTE — Progress Notes (Signed)
Patient ID: [name], female   DOB: July 18, 1945, 68 y.o.   MRN: [id number] PCP: Webb Silversmith  68 yo with history of CAD, ischemic cardiomyopathy, and atrial fibrillation presents for cardiology followup.  Patient was hospitalized in 11/12 at [facility] for VT with ICD discharge.  She had a left heart cath showing patent stents.  EF was 35-40% by echo.  She is on amiodarone.  She has had periodic problems with creatinine rising with medication adjustments.  Repeat echo in 5/14 showed EF 40-45%.    She returns for follow up. On Monday she was sent to [facility] ED by Urgent Care for diarrhea and abdominal discomfort. XRays with no acute findings. She was later discharged after it was thought to be from GI virus. Today she is feeling better and diarrhea has resolved. She continued to take all medications. Denies SOB/PND/Orthopnea/CP.  Complaining of dysuria. No currently exercising.    Labs (10/12): LDL 73, HDL 44 Labs (11/12): K 3.9, creatinine 1.1, LFTs normal, TSH normal Labs (12/12): K 3.9, creatinine 1.5, proBNP 18 Labs (1/13): LDL 82, HDL 57 Labs (2/13): K 4.3, creatinine 1.5 Labs (5/13): creatinine 2.4 => 1.7, LFTs normal, TSH normal, proBNP 18 Labs (6/13): K 4.2, creatinine 1.7 Labs (10/13): K 4.3, creatinine 1.4 Labs (4/14); LFTs normal, TSH normal, LDL 71, HDL 58 Labs (5/14): K 4.5, creatinine 1.4 Labs (8/14): TSH normal, LFTs normal Labs (9/14): K 4.2, creatinine 1.8, LDL 75, HDL 54 Labs (07/31/13) K 3.7 Creatinine 1.8 BNP 148   PMH: 1. Diabetes mellitus 2. CVA, TIA in 2010 3. HTN 4. CKD 5. H/o TAH 6. H/o CCY 7. Sciatica 8. Atrial fibrillation 9. CAD: s/p LAD and RCA PCI.  Last LHC in 11/12 with patent proximal LAD stent, ostial 70% D1 (jailed by stent), mild LAD stent patent, patent RCA stents, EF 40% with global hypokinesis.  10. Ischemic cardiomyopathy: Echo (10/12): EF 35-40%, moderate focal basal septal hypertrophy, inferior akinesis, grade I diastolic dysfunction, moderate  aortic insufficiency. St Jude dual chamber ICD.  Spironolactone stopped when creatinine rose to 2.5.  Echo (5/14) with EF 40-45%, mild AI.  11. Aortic insufficiency: moderate in past but mild on most recent echo.   12. 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   SH: Divorced.  3 children.  Quit smoking in 1997. [place] in [place]  [place].   FH: CAD  ROS: All systems reviewed and negative except as per HPI.   Current Outpatient Prescriptions  Medication Sig Dispense Refill  . ACCU-CHEK SMARTVIEW test strip       . amiodarone (PACERONE) 200 MG tablet Take 200 mg by mouth daily.      . Blood Glucose Monitoring Suppl (ACCU-CHEK NANO SMARTVIEW) W/DEVICE KIT       . Blood Glucose Monitoring Suppl (ACCU-CHEK NANO SMARTVIEW) W/DEVICE KIT USE AS DIRECTED  1 kit  0  . carvedilol (COREG) 25 MG tablet Take 25 mg by mouth 2 (two) times daily with a meal.      . cetirizine (ZYRTEC) 10 MG tablet Take 10 mg by mouth daily.      . Cholecalciferol (VITAMIN D3) 2000 UNITS TABS Take 2,000 Units by mouth daily.       . diclofenac sodium (VOLTAREN) 1 % GEL Apply 2 g topically 4 (four) times daily.      . furosemide (LASIX) 20 MG tablet Take 1 tablet (20 mg total) by mouth daily.  30 tablet  11  . gabapentin (NEURONTIN) 400 MG capsule Take 1  capsule (400 mg total) by mouth 2 (two) times daily.  180 capsule  3  . Insulin Detemir (LEVEMIR FLEXTOUCH) 100 UNIT/ML Pen Inject 50-55 Units into the skin 2 (two) times daily. 50 units in the morning and 55 units at bedtime      . insulin glulisine (APIDRA) 100 UNIT/ML injection Inject 14-22 Units into the skin 3 (three) times daily with meals. Starts at 14 units with a meal and can go up to 22 units depending on meal size Sliding scale      . lisinopril (PRINIVIL,ZESTRIL) 5 MG tablet Take 5 mg by mouth daily.      . Multiple Vitamins-Minerals (EYE VITAMINS PO) Take 1 tablet by mouth daily.       . nitrofurantoin, macrocrystal-monohydrate, (MACROBID) 100 MG capsule Take 100  mg by mouth 2 (two) times daily.      . nitroGLYCERIN (NITROSTAT) 0.4 MG SL tablet Place 0.4 mg under the tongue every 5 (five) minutes as needed for chest pain.      . nizatidine (AXID) 150 MG capsule Take 150 mg by mouth daily.      [name] omeprazole (PRILOSEC) 20 MG capsule Take 1 capsule (20 mg total) by mouth daily.  30 capsule  5  . ondansetron (ZOFRAN ODT) 4 MG disintegrating tablet Take 1 tablet (4 mg total) by mouth every 8 (eight) hours as needed for nausea or vomiting.  20 tablet  0  . rosuvastatin (CRESTOR) 20 MG tablet Take 20 mg by mouth daily.      . Vitamin D, Ergocalciferol, (DRISDOL) 50000 UNITS CAPS capsule Take 50,000 Units by mouth every 7 (seven) days. monday      . warfarin (COUMADIN) 2.5 MG tablet Take 1.25 mg by mouth daily. 1/2 tablet daily (1.$RemoveBeforeD'25mg'[id number]$ )       No current facility-administered medications for this encounter.    BP 124/62  Pulse 89  Wt 211 lb (95.709 kg)  SpO2 93% General: NAD, obese Neck: Thick, JVP 5-6 cm, no thyromegaly or thyroid nodule.  Lungs: Clear to auscultation bilaterally with normal respiratory effort. CV: Nondisplaced PMI.  Heart regular S1/S2, no S3/S4, 2/6 early SEM.  Trace ankle edema.  No carotid bruit.  Normal pedal pulses.  Abdomen: OBese, Soft, nontender, no hepatosplenomegaly, no distention.  Neurologic: Alert and oriented x 3.  Psych: Normal affect. Extremities: No clubbing or cyanosis.   Assessment/Plan Reviewed ED records from 07/31/13 to include all lab work.   1. Atrial fibrillation  Paroxysmal. Continue amiodarone 200 mg daily. Continue warfarin. Has history of  CVA, needs Lovenox bridge if she has to stop warfarin at any point of time.  Needs yearly eye exams. .  2. Coronary artery disease   No evidence of ischemia. Nonobstructive disease on last cath. As she is on warfarin also with stable CAD, she is not on aspirin. Continue statin, good lipids in 9/14.  3. Ventricular tachycardia On amiodarone with suppression of VT.  4.  Ischemic cardiomyopathy NYHA class II-III symptoms. Volume status stable. Continue current dose of lasix.   - Continue current doses of Coreg and lisinopril.  She is off spironolactone since recent AKI episode.  5. UTI- UA with evidence of UTI. Started on Bactrim DS.    Tuvia Woodrick D Avanish Cerullo NP-C  08/02/2013

## 2013-08-02 NOTE — Patient Instructions (Signed)
Follow up in 3 months with Dr Aundra Dubin  Take Bactrim DS for twice a day for 4 days   Do the following things EVERYDAY: 1) Weigh yourself in the morning before breakfast. Write it down and keep it in a log. 2) Take your medicines as prescribed 3) Eat low salt foods-Limit salt (sodium) to 2000 mg per day.  4) Stay as active as you can everyday 5) Limit all fluids for the day to less than 2 liters

## 2013-08-03 DIAGNOSIS — N390 Urinary tract infection, site not specified: Secondary | ICD-10-CM | POA: Insufficient documentation

## 2013-08-03 HISTORY — DX: Urinary tract infection, site not specified: N39.0

## 2013-08-03 NOTE — ED Provider Notes (Signed)
Medical screening examination/treatment/procedure(s) were performed by a resident physician or non-physician practitioner and as the supervising physician I was immediately available for consultation/collaboration.  Lynne Leader, MD    Gregor Hams, MD 08/03/13 [phone]

## 2013-08-08 ENCOUNTER — Encounter: Payer: Self-pay | Admitting: Internal Medicine

## 2013-08-08 ENCOUNTER — Ambulatory Visit (INDEPENDENT_AMBULATORY_CARE_PROVIDER_SITE_OTHER): Payer: PRIVATE HEALTH INSURANCE | Admitting: *Deleted

## 2013-08-08 DIAGNOSIS — I472 Ventricular tachycardia: Secondary | ICD-10-CM

## 2013-08-08 DIAGNOSIS — I255 Ischemic cardiomyopathy: Secondary | ICD-10-CM

## 2013-08-08 DIAGNOSIS — I2589 Other forms of chronic ischemic heart disease: Secondary | ICD-10-CM

## 2013-08-08 DIAGNOSIS — I4729 Other ventricular tachycardia: Secondary | ICD-10-CM

## 2013-08-08 LAB — MDC_IDC_ENUM_SESS_TYPE_REMOTE
Brady Statistic RA Percent Paced: 98 %
HIGH POWER IMPEDANCE MEASURED VALUE: 44 Ohm
Implantable Pulse Generator Model: 2241
Implantable Pulse Generator Serial Number: 819806
Lead Channel Impedance Value: 410 Ohm
Lead Channel Impedance Value: 490 Ohm
Lead Channel Pacing Threshold Pulse Width: 0.5 ms
Lead Channel Sensing Intrinsic Amplitude: 11.8 mV
Lead Channel Sensing Intrinsic Amplitude: 3.6 mV
Lead Channel Setting Pacing Amplitude: 2 V
Lead Channel Setting Pacing Pulse Width: 0.5 ms
Lead Channel Setting Sensing Sensitivity: 0.5 mV
MDC IDC MSMT LEADCHNL RA PACING THRESHOLD AMPLITUDE: 0.5 V
MDC IDC SET LEADCHNL RV PACING AMPLITUDE: 2.5 V
MDC IDC SET ZONE DETECTION INTERVAL: 270 ms
MDC IDC SET ZONE DETECTION INTERVAL: 340 ms
MDC IDC STAT BRADY RV PERCENT PACED: 2.5 %

## 2013-08-08 NOTE — Progress Notes (Signed)
Remote ICD transmission.   

## 2013-08-10 ENCOUNTER — Encounter: Payer: Self-pay | Admitting: Cardiology

## 2013-08-26 ENCOUNTER — Other Ambulatory Visit: Payer: Self-pay | Admitting: Cardiology

## 2013-08-28 ENCOUNTER — Ambulatory Visit (INDEPENDENT_AMBULATORY_CARE_PROVIDER_SITE_OTHER): Payer: Commercial Managed Care - HMO | Admitting: Surgery

## 2013-08-28 ENCOUNTER — Other Ambulatory Visit: Payer: Self-pay

## 2013-08-28 DIAGNOSIS — Z7901 Long term (current) use of anticoagulants: Secondary | ICD-10-CM

## 2013-08-28 DIAGNOSIS — I4891 Unspecified atrial fibrillation: Secondary | ICD-10-CM

## 2013-08-28 DIAGNOSIS — Z5181 Encounter for therapeutic drug level monitoring: Secondary | ICD-10-CM

## 2013-08-28 LAB — POCT INR: INR: 5.8

## 2013-08-29 ENCOUNTER — Other Ambulatory Visit: Payer: Self-pay | Admitting: *Deleted

## 2013-08-29 ENCOUNTER — Telehealth: Payer: Self-pay | Admitting: *Deleted

## 2013-08-29 MED ORDER — INSULIN DETEMIR 100 UNIT/ML FLEXPEN: 50.0000 [IU] | PEN_INJECTOR | Freq: Two times a day (BID) | SUBCUTANEOUS | Status: DC

## 2013-08-29 MED ORDER — NIZATIDINE 150 MG PO CAPS: 150.0000 mg | ORAL_CAPSULE | Freq: Every day | ORAL | Status: DC

## 2013-08-29 NOTE — Telephone Encounter (Signed)
OK to refill for 3 mo. 

## 2013-08-29 NOTE — Telephone Encounter (Signed)
Pt called requesting a refill for Levemir. Done. Pt stated that she was seeing Webb Silversmith, she moved. Pt then began seeing Stacy Gardner, she left. Pt does not have a PCP at [facility] yet. Pt needs a refill of her nizatidine. Wants to know if Dr Cruzita Lederer is willing to refill for her until she is able to see a new PCP there at [facility]. Please advise.

## 2013-09-04 ENCOUNTER — Ambulatory Visit (INDEPENDENT_AMBULATORY_CARE_PROVIDER_SITE_OTHER): Payer: Commercial Managed Care - HMO | Admitting: *Deleted

## 2013-09-04 DIAGNOSIS — Z5181 Encounter for therapeutic drug level monitoring: Secondary | ICD-10-CM

## 2013-09-04 DIAGNOSIS — Z7901 Long term (current) use of anticoagulants: Secondary | ICD-10-CM

## 2013-09-04 DIAGNOSIS — I4891 Unspecified atrial fibrillation: Secondary | ICD-10-CM

## 2013-09-04 LAB — POCT INR: INR: 2.4

## 2013-09-06 ENCOUNTER — Encounter: Payer: Self-pay | Admitting: Cardiology

## 2013-09-13 ENCOUNTER — Other Ambulatory Visit: Payer: Self-pay | Admitting: Cardiology

## 2013-09-18 ENCOUNTER — Ambulatory Visit (INDEPENDENT_AMBULATORY_CARE_PROVIDER_SITE_OTHER): Payer: Commercial Managed Care - HMO | Admitting: *Deleted

## 2013-09-18 DIAGNOSIS — I4891 Unspecified atrial fibrillation: Secondary | ICD-10-CM | POA: Diagnosis not present

## 2013-09-18 DIAGNOSIS — Z7901 Long term (current) use of anticoagulants: Secondary | ICD-10-CM

## 2013-09-18 DIAGNOSIS — Z5181 Encounter for therapeutic drug level monitoring: Secondary | ICD-10-CM

## 2013-09-18 LAB — POCT INR: INR: 2.2

## 2013-09-27 ENCOUNTER — Other Ambulatory Visit: Payer: Self-pay | Admitting: Internal Medicine

## 2013-09-27 NOTE — Addendum Note (Signed)
Addended by: Sharon Seller B on: 09/27/2013 11:58 AM   Modules accepted: Orders

## 2013-09-27 NOTE — Telephone Encounter (Signed)
Dr. Jenny Reichmann Pt

## 2013-10-02 ENCOUNTER — Other Ambulatory Visit: Payer: Self-pay

## 2013-10-09 ENCOUNTER — Ambulatory Visit (INDEPENDENT_AMBULATORY_CARE_PROVIDER_SITE_OTHER): Payer: Commercial Managed Care - HMO

## 2013-10-09 DIAGNOSIS — Z5181 Encounter for therapeutic drug level monitoring: Secondary | ICD-10-CM | POA: Diagnosis not present

## 2013-10-09 DIAGNOSIS — I4891 Unspecified atrial fibrillation: Secondary | ICD-10-CM | POA: Diagnosis not present

## 2013-10-09 DIAGNOSIS — Z7901 Long term (current) use of anticoagulants: Secondary | ICD-10-CM

## 2013-10-09 LAB — POCT INR: INR: 2.5

## 2013-10-16 ENCOUNTER — Ambulatory Visit (INDEPENDENT_AMBULATORY_CARE_PROVIDER_SITE_OTHER): Payer: Commercial Managed Care - HMO | Admitting: Internal Medicine

## 2013-10-16 ENCOUNTER — Encounter: Payer: Self-pay | Admitting: Internal Medicine

## 2013-10-16 VITALS — BP 128/76 | HR 88 | Temp 98.0°F | Resp 12 | Wt 212.0 lb

## 2013-10-16 DIAGNOSIS — E119 Type 2 diabetes mellitus without complications: Secondary | ICD-10-CM

## 2013-10-16 LAB — HEMOGLOBIN A1C: Hgb A1c MFr Bld: 7.8 % — ABNORMAL HIGH (ref 4.6–6.5)

## 2013-10-16 MED ORDER — INSULIN LISPRO 100 UNIT/ML ~~LOC~~ SOLN: 12.0000 [IU] | Freq: Three times a day (TID) | SUBCUTANEOUS | Status: DC

## 2013-10-16 MED ORDER — INSULIN DETEMIR 100 UNIT/ML FLEXPEN: PEN_INJECTOR | SUBCUTANEOUS | Status: DC

## 2013-10-16 NOTE — Progress Notes (Signed)
Patient ID: [name], female   DOB: 30-Mar-1945, 68 y.o.   MRN: [id number]   HPI: [name] is a 68 y.o.-year-old female, returning for f/u for DM2, insulin-dependent, uncontrolled, with complications (CAD, ICM - had ICD, peripheral neuropathy, gastroparesis). Last visit 3 mo ago.  Last hemoglobin A1c was: 07/03/2013: 7.2% - through her insurance  Lab Results  Component Value Date   HGBA1C 8.2* 04/03/2013   HGBA1C 9.1* 12/15/2012   HGBA1C 9.6* 07/18/2012   Pt is on a regimen of: - Levemir 50 units in am and 55 in hs - pen  - Apidra 14-16-20 (depending on size of meal) with a meal - vial - + added SSI at last visit target 150, ISF 15  Pt checks her sugars 3-4 a day and they are - brings a good log: - am: low 100-low 200s >> 148-234 >> 170-268 >> 141-238 >> 180-214 >> 134-271 >> 104-189 in the last 2 weeks, before 132-236 - before lunch: 180s >> 68-200 >> 68-330 (130-175) >> 56 (one value) 100-215 >> 103-256 (most <170) >> 63, 76, 140-225 >> 57 x1, 66-160, spikes at 200 - after lunch: 55-171 >> n/c >> 100 >> n/c >> n/c - before dinner: 90s-150 >> 61-188 >> 69-277 (120-180) >> 84-197 (best sugars) >> 64-206 (most 100-170) >> 70, 86, 99-224 >> 74-212, 1x 265 - bedtime: 100-150 >>112-279 >> 92-249 >> 110-230 >> 92-211 (most 130-180), one 321 >> 85-265 >> 79-200, 220 x 1, 237, 307 x1 Lowest sugar was 56 >> 60 >> 63; she has hypoglycemia awareness at 70. Highest sugar was 544 >> 358 >> 405 >> 307 now.  Pt's meals are: - Breakfast: small bowl of cereal frosted with 2% milk - Lunch (5-14 units): vegetable + starch; turkey/ham sandwich - Dinner (20 units): fast food or cooks: meat + vegetables + sometimes potatoes - Snacks: fruit (apple) Did not see nutrition yet >> did not have transportation She has a glass of 2% milk before bedtime. Tried Almond milk but did not like it as it was too sweet.  - Has CKD, last BUN/creatinine:  Lab Results  Component Value Date   BUN 32*  07/31/2013   CREATININE 1.88* 07/31/2013  ACR in 12/15/2012: 1.0.  - last set of lipids: Lab Results  Component Value Date   CHOL 154 12/15/2012   HDL 54.10 12/15/2012   LDLCALC 75 12/15/2012   TRIG 127.0 12/15/2012   CHOLHDL 3 12/15/2012  she is on Crestor. - last eye exam was in 10/2012. No DR.  - + numbness and tingling in her feet - on neurontin  She is supposed to have left knee arthroscopy, but needs to get HbA1c <8%.   I reviewed pt's medications, allergies, PMH, social hx, family hx and no changes required, except as mentioned above.  ROS: Constitutional: + weight gain, no fatigue, no subjective hyperthermia/hypothermia Eyes: no blurry vision, no xerophthalmia ENT: no sore throat, no nodules palpated in throat, no dysphagia/odynophagia, no hoarseness Cardiovascular: no CP/SOB/no palpitations/+ leg swelling Respiratory: no cough/SOB Gastrointestinal: no N/V/D/C Musculoskeletal: no muscle/joint aches Skin: no rashes, + excessive hair growth  PE: BP 128/76  Pulse 88  Temp(Src) 98 F (36.7 C) (Oral)  Resp 12  Wt 212 lb (96.163 kg)  SpO2 95% Body mass index is 37.56 kg/(m^2).  Wt Readings from Last 3 Encounters:  10/16/13 212 lb (96.163 kg)  08/02/13 211 lb (95.709 kg)  07/31/13 208 lb 12.4 oz (94.7 kg)   Constitutional: overweight, in NAD,  full supraclavicular fat pads Eyes: PERRLA, EOMI, no exophthalmos ENT: moist mucous membranes, no thyromegaly, no cervical lymphadenopathy Cardiovascular: RRR, No MRG, + pitting LE edema Respiratory: CTA B Gastrointestinal: abdomen soft, NT, ND, BS+ Musculoskeletal: no deformities, strength intact in all 4 Skin: moist, warm, no rashes  ASSESSMENT: 1. DM2, insulin-dependent, uncontrolled, with complications - CAD, ICM - s/p ICD placement - CKD - PN - on neurontin - gastroparesis per GES 06/18/2012 >> 60 minutes: 92%, 120 minutes: 82%  PLAN:  1. Patient with long-standing, uncontrolled diabetes, on basal bolus regimen, with  still higher sugars in am and better sugars during the day, but with overall improvement in last 2 weeks - unclear reason - We discussed about options for treatment, and I suggested to:    Patient Instructions  Change Levemir to 45 units in am and 60 units before bedtime.  Stop Apidra and start Humalog: (insurance coverage changed) 14 units for a smaller meal  16 units for a medium meal  20 units before a large meal or dinner  Continue following sliding scale:  - 150- 165: + 1 unit  - 166- 180: + 2 units  - 181- 195: + 3 units  - 196- 210: + 4 units  - >210: + 5 units   Please return in 3 months with your sugar log. Please call me or message me if sugars stay <80 or >200 consistently.  Please stop at the lab.  - continue checking her sugars at different times of the day - check 4 times a day - up to date with eye exams, but needs one soon >> advised to schedule it - given new sugar logs - will check a HbA1c today - Return to clinic in 3 months with sugar log   Office Visit on 10/16/2013  Component Date Value Ref Range Status  . Hemoglobin A1C 10/16/2013 7.8* 4.6 - 6.5 % Final   Glycemic Control Guidelines for People with Diabetes:Non Diabetic:  <6%Goal of Therapy: <7%Additional Action Suggested:  >8%   Improved HbA1c (from previous HbA1c levels obtained by our lab)!

## 2013-10-16 NOTE — Patient Instructions (Addendum)
Change Levemir to 45 units in am and 60 units before bedtime.  Stop Apidra and start Humalog:  14 units for a smaller meal  16 units for a medium meal  20 units before a large meal or dinner  Continue following sliding scale:  - 150- 165: + 1 unit  - 166- 180: + 2 units  - 181- 195: + 3 units  - 196- 210: + 4 units  - >210: + 5 units   Please return in 3 months with your sugar log. Please call me or message me if sugars stay <80 or >200 consistently.  Please stop at the lab.

## 2013-10-17 ENCOUNTER — Encounter (HOSPITAL_COMMUNITY): Payer: Self-pay | Admitting: Vascular Surgery

## 2013-10-25 ENCOUNTER — Other Ambulatory Visit: Payer: Self-pay | Admitting: Cardiology

## 2013-10-31 ENCOUNTER — Encounter: Payer: PRIVATE HEALTH INSURANCE | Admitting: Cardiology

## 2013-11-08 ENCOUNTER — Other Ambulatory Visit: Payer: Self-pay

## 2013-11-09 ENCOUNTER — Ambulatory Visit (INDEPENDENT_AMBULATORY_CARE_PROVIDER_SITE_OTHER): Payer: Commercial Managed Care - HMO

## 2013-11-09 ENCOUNTER — Encounter: Payer: Self-pay | Admitting: Internal Medicine

## 2013-11-09 ENCOUNTER — Ambulatory Visit (INDEPENDENT_AMBULATORY_CARE_PROVIDER_SITE_OTHER): Payer: Commercial Managed Care - HMO | Admitting: Internal Medicine

## 2013-11-09 VITALS — BP 140/80 | HR 65 | Ht 63.0 in | Wt 212.8 lb

## 2013-11-09 DIAGNOSIS — I2589 Other forms of chronic ischemic heart disease: Secondary | ICD-10-CM

## 2013-11-09 DIAGNOSIS — Z7901 Long term (current) use of anticoagulants: Secondary | ICD-10-CM

## 2013-11-09 DIAGNOSIS — I472 Ventricular tachycardia: Secondary | ICD-10-CM

## 2013-11-09 DIAGNOSIS — I4891 Unspecified atrial fibrillation: Secondary | ICD-10-CM

## 2013-11-09 DIAGNOSIS — I5022 Chronic systolic (congestive) heart failure: Secondary | ICD-10-CM

## 2013-11-09 DIAGNOSIS — I509 Heart failure, unspecified: Secondary | ICD-10-CM

## 2013-11-09 DIAGNOSIS — I2510 Atherosclerotic heart disease of native coronary artery without angina pectoris: Secondary | ICD-10-CM

## 2013-11-09 DIAGNOSIS — I255 Ischemic cardiomyopathy: Secondary | ICD-10-CM

## 2013-11-09 DIAGNOSIS — I482 Chronic atrial fibrillation, unspecified: Secondary | ICD-10-CM

## 2013-11-09 DIAGNOSIS — I4729 Other ventricular tachycardia: Secondary | ICD-10-CM

## 2013-11-09 DIAGNOSIS — Z5181 Encounter for therapeutic drug level monitoring: Secondary | ICD-10-CM

## 2013-11-09 DIAGNOSIS — Z95810 Presence of automatic (implantable) cardiac defibrillator: Secondary | ICD-10-CM

## 2013-11-09 LAB — MDC_IDC_ENUM_SESS_TYPE_INCLINIC
Battery Remaining Longevity: 67.2 mo
Brady Statistic RA Percent Paced: 98 %
Brady Statistic RV Percent Paced: 2.3 %
HIGH POWER IMPEDANCE MEASURED VALUE: 47 Ohm
Implantable Pulse Generator Model: 2241
Implantable Pulse Generator Serial Number: 819806
Lead Channel Impedance Value: 437.5 Ohm
Lead Channel Impedance Value: 525 Ohm
Lead Channel Pacing Threshold Amplitude: 0.75 V
Lead Channel Pacing Threshold Amplitude: 0.75 V
Lead Channel Pacing Threshold Pulse Width: 0.5 ms
Lead Channel Pacing Threshold Pulse Width: 0.5 ms
Lead Channel Sensing Intrinsic Amplitude: 11.8 mV
Lead Channel Setting Pacing Amplitude: 2 V
Lead Channel Setting Pacing Pulse Width: 0.5 ms
MDC IDC MSMT LEADCHNL RA PACING THRESHOLD PULSEWIDTH: 0.5 ms
MDC IDC MSMT LEADCHNL RA SENSING INTR AMPL: 4 mV
MDC IDC MSMT LEADCHNL RV PACING THRESHOLD AMPLITUDE: 0.75 V
MDC IDC MSMT LEADCHNL RV PACING THRESHOLD AMPLITUDE: 0.75 V
MDC IDC MSMT LEADCHNL RV PACING THRESHOLD PULSEWIDTH: 0.5 ms
MDC IDC SESS DTM: 20150820162603
MDC IDC SET LEADCHNL RV PACING AMPLITUDE: 2.5 V
MDC IDC SET LEADCHNL RV SENSING SENSITIVITY: 0.5 mV
MDC IDC SET ZONE DETECTION INTERVAL: 340 ms
Zone Setting Detection Interval: 270 ms

## 2013-11-09 LAB — POCT INR: INR: 2.3

## 2013-11-09 NOTE — Progress Notes (Signed)
PCP: Cathlean Cower, MD Primary Cardiologist:  Dr Daisy Floro is a 68 y.o. female who presents today for routine electrophysiology followup.  Since last being seen in our clinic, the patient reports doing very well.  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   She is not very active.  She has occasional fatigue.  Today, she denies symptoms of palpitations, chest pain, shortness of breath,  lower extremity edema, dizziness, presyncope, syncope, or ICD shocks.  The patient is otherwise without complaint today.   Past Medical History  Diagnosis Date  . Chronic systolic heart failure   . Diabetes mellitus     type 1  . Hypertension   . Stroke 2010    eye doctor said she had TIA  . Ischemic cardiomyopathy   . Ventricular tachycardia     Polymorphic  . 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   . Atrial fibrillation     controlled with amiodarone, on coumadin  . Coronary artery disease 01/31/2011  . Chronic renal insufficiency   . Lumbar spondylosis 01/11/2012  . History of chicken pox   . Allergy   . Hyperlipidemia    Past Surgical History  Procedure Laterality Date  . Abdominal hysterectomy  1995  . Cholecystectomy  1985  . Tubaligation  1980  . Icd  2003/2007    implanted by Dr Rollene Fare, most recent generator change 2/13 by Dr Rayann Heman, Analyze ST study patient    Current Outpatient Prescriptions  Medication Sig Dispense Refill  . ACCU-CHEK SMARTVIEW test strip       . amiodarone (PACERONE) 200 MG tablet Take 200 mg by mouth daily.      . Blood Glucose Monitoring Suppl (ACCU-CHEK NANO SMARTVIEW) W/DEVICE KIT USE AS DIRECTED  1 kit  0  . carvedilol (COREG) 25 MG tablet Take 25 mg by mouth 2 (two) times daily with a meal.      . carvedilol (COREG) 25 MG tablet TAKE 1 TABLET (25 MG TOTAL) BY MOUTH 2 (TWO) TIMES DAILY WITH A MEAL.  60 tablet  1  . cetirizine (ZYRTEC) 10 MG tablet Take 10 mg by mouth daily.      . Cholecalciferol (VITAMIN D3) 2000 UNITS  TABS Take 2,000 Units by mouth daily.       . CRESTOR 20 MG tablet TAKE 1 TABLET BY MOUTH EVERY DAY  30 tablet  1  . diclofenac sodium (VOLTAREN) 1 % GEL Apply 2 g topically 4 (four) times daily.      . furosemide (LASIX) 20 MG tablet Take 1 tablet (20 mg total) by mouth daily.  30 tablet  11  . gabapentin (NEURONTIN) 400 MG capsule Take 1 capsule (400 mg total) by mouth 2 (two) times daily.  180 capsule  3  . Insulin Detemir (LEVEMIR FLEXTOUCH) 100 UNIT/ML Pen Inject under skin 45 units in am and 60 units at bedtime  45 mL  1  . insulin lispro (HUMALOG) 100 UNIT/ML injection Inject 0.12-0.22 mLs (12-22 Units total) into the skin 3 (three) times daily before meals.  20 mL  11  . lisinopril (PRINIVIL,ZESTRIL) 5 MG tablet Take 5 mg by mouth daily.      [name] lisinopril (PRINIVIL,ZESTRIL) 5 MG tablet Take 1 tablet (5 mg total) by mouth daily.  30 tablet  1  . Multiple Vitamins-Minerals (EYE VITAMINS PO) Take 1 tablet by mouth daily.       . nitrofurantoin, macrocrystal-monohydrate, (MACROBID) 100 MG capsule Take 100 mg  by mouth 2 (two) times daily.      . nitroGLYCERIN (NITROSTAT) 0.4 MG SL tablet Place 0.4 mg under the tongue every 5 (five) minutes as needed for chest pain.      . nizatidine (AXID) 150 MG capsule Take 1 capsule (150 mg total) by mouth daily.  90 capsule  0  . omeprazole (PRILOSEC) 20 MG capsule Take 1 capsule (20 mg total) by mouth daily.  30 capsule  5  . ondansetron (ZOFRAN ODT) 4 MG disintegrating tablet Take 1 tablet (4 mg total) by mouth every 8 (eight) hours as needed for nausea or vomiting.  20 tablet  0  . rosuvastatin (CRESTOR) 20 MG tablet Take 20 mg by mouth daily.      [name] sulfamethoxazole-trimethoprim (BACTRIM DS) 800-160 MG per tablet Take 1 tablet by mouth 2 (two) times daily.  8 tablet  0  . Vitamin D, Ergocalciferol, (DRISDOL) 50000 UNITS CAPS capsule Take 50,000 Units by mouth every 7 (seven) days. monday      . warfarin (COUMADIN) 2.5 MG tablet Take 1.25 mg by mouth  daily. 1/2 tablet daily (1.$RemoveBeforeD'25mg'dNzfZjNmdjzeRh$ )       No current facility-administered medications for this visit.   ROS- all systems are reviewed and negative except as per HPI above  Physical Exam: Filed Vitals:   11/09/13 1220  BP: 140/80  Pulse: 65  Height: $Remove'5\' 3"'mvpSeqp$  (1.6 m)  Weight: 212 lb 12.8 oz (96.525 kg)    GEN- The patient is well appearing, alert and oriented x 3 today.   Head- normocephalic, atraumatic Eyes-  Sclera clear, conjunctiva pink Ears- hearing intact Oropharynx- clear Lungs- Clear to ausculation bilaterally, normal work of breathing Chest- ICD pocket is well healed Heart- Regular rate and rhythm, no murmurs, rubs or gallops, PMI not laterally displaced GI- soft, NT, ND, + BS Extremities- no clubbing, cyanosis, or edema  ICD interrogation- reviewed in detail today,  See PACEART report  Assessment and Plan:  1.  Chronic systolic dysfunction euvolemic today Stable on an appropriate medical regimen Normal ICD function See Pace Art report No changes today  2. VT Controlled with amiodarone CHF clinic to follow LFTs/TFTs  3. Amiodarone Controlled Continue long term anticoagulation  chads2vasc score is 8  Follow-up with Dr Aundra Dubin as scheduled [place] I will see in a year

## 2013-11-09 NOTE — Patient Instructions (Signed)
Your physician wants you to follow-up in: [place]. You will receive a reminder letter in the mail two months in advance. If you don't receive a letter, please call our office to schedule the follow-up appointment. Your physician wants you to follow-up in: [place].  You will receive a reminder letter in the mail two months in advance. If you don't receive a letter, please call our office to schedule the follow-up appointment. Your physician recommends that you continue on your current medications as directed. Please refer to the Current Medication list given to you today.

## 2013-11-15 ENCOUNTER — Encounter: Payer: Self-pay | Admitting: Internal Medicine

## 2013-11-18 ENCOUNTER — Other Ambulatory Visit: Payer: Self-pay | Admitting: Cardiology

## 2013-11-26 ENCOUNTER — Other Ambulatory Visit: Payer: Self-pay | Admitting: Physician Assistant

## 2013-11-26 ENCOUNTER — Other Ambulatory Visit: Payer: Self-pay | Admitting: Cardiology

## 2013-11-26 ENCOUNTER — Other Ambulatory Visit: Payer: Self-pay | Admitting: Internal Medicine

## 2013-11-29 NOTE — Telephone Encounter (Signed)
Ok to advise drug store if needed, o/w I'm not sure how to address this

## 2013-11-29 NOTE — Telephone Encounter (Signed)
Drug store didn't change PCP. Please advise.

## 2013-11-30 ENCOUNTER — Other Ambulatory Visit: Payer: Self-pay | Admitting: Internal Medicine

## 2013-11-30 ENCOUNTER — Other Ambulatory Visit: Payer: Self-pay | Admitting: Physician Assistant

## 2013-12-05 ENCOUNTER — Encounter: Payer: Self-pay | Admitting: Gastroenterology

## 2013-12-05 ENCOUNTER — Encounter (HOSPITAL_COMMUNITY): Payer: Self-pay

## 2013-12-05 ENCOUNTER — Ambulatory Visit (HOSPITAL_COMMUNITY)
Admission: RE | Admit: 2013-12-05 | Discharge: 2013-12-05 | Disposition: A | Discharge status: Home / Self Care | Payer: Medicare HMO | Source: Ambulatory Visit | Attending: Cardiology | Admitting: Cardiology

## 2013-12-05 VITALS — BP 120/56 | HR 80 | Wt 211.1 lb

## 2013-12-05 DIAGNOSIS — K589 Irritable bowel syndrome without diarrhea: Secondary | ICD-10-CM

## 2013-12-05 DIAGNOSIS — Z794 Long term (current) use of insulin: Secondary | ICD-10-CM | POA: Diagnosis not present

## 2013-12-05 DIAGNOSIS — N189 Chronic kidney disease, unspecified: Secondary | ICD-10-CM | POA: Diagnosis not present

## 2013-12-05 DIAGNOSIS — I5022 Chronic systolic (congestive) heart failure: Secondary | ICD-10-CM | POA: Insufficient documentation

## 2013-12-05 DIAGNOSIS — I509 Heart failure, unspecified: Secondary | ICD-10-CM | POA: Insufficient documentation

## 2013-12-05 DIAGNOSIS — Z79899 Other long term (current) drug therapy: Secondary | ICD-10-CM | POA: Diagnosis not present

## 2013-12-05 DIAGNOSIS — I4729 Other ventricular tachycardia: Secondary | ICD-10-CM | POA: Diagnosis not present

## 2013-12-05 DIAGNOSIS — E119 Type 2 diabetes mellitus without complications: Secondary | ICD-10-CM | POA: Diagnosis not present

## 2013-12-05 DIAGNOSIS — I2589 Other forms of chronic ischemic heart disease: Secondary | ICD-10-CM | POA: Insufficient documentation

## 2013-12-05 DIAGNOSIS — I129 Hypertensive chronic kidney disease with stage 1 through stage 4 chronic kidney disease, or unspecified chronic kidney disease: Secondary | ICD-10-CM | POA: Diagnosis not present

## 2013-12-05 DIAGNOSIS — R109 Unspecified abdominal pain: Secondary | ICD-10-CM | POA: Diagnosis not present

## 2013-12-05 DIAGNOSIS — Z90710 Acquired absence of both cervix and uterus: Secondary | ICD-10-CM | POA: Diagnosis not present

## 2013-12-05 DIAGNOSIS — Z9889 Other specified postprocedural states: Secondary | ICD-10-CM | POA: Insufficient documentation

## 2013-12-05 DIAGNOSIS — Z8673 Personal history of transient ischemic attack (TIA), and cerebral infarction without residual deficits: Secondary | ICD-10-CM | POA: Insufficient documentation

## 2013-12-05 DIAGNOSIS — Z87891 Personal history of nicotine dependence: Secondary | ICD-10-CM | POA: Insufficient documentation

## 2013-12-05 DIAGNOSIS — I472 Ventricular tachycardia, unspecified: Secondary | ICD-10-CM | POA: Insufficient documentation

## 2013-12-05 DIAGNOSIS — I2510 Atherosclerotic heart disease of native coronary artery without angina pectoris: Secondary | ICD-10-CM

## 2013-12-05 DIAGNOSIS — M5430 Sciatica, unspecified side: Secondary | ICD-10-CM | POA: Diagnosis not present

## 2013-12-05 DIAGNOSIS — I480 Paroxysmal atrial fibrillation: Secondary | ICD-10-CM

## 2013-12-05 DIAGNOSIS — N183 Chronic kidney disease, stage 3 unspecified: Secondary | ICD-10-CM

## 2013-12-05 DIAGNOSIS — I359 Nonrheumatic aortic valve disorder, unspecified: Secondary | ICD-10-CM | POA: Diagnosis not present

## 2013-12-05 DIAGNOSIS — Z8249 Family history of ischemic heart disease and other diseases of the circulatory system: Secondary | ICD-10-CM | POA: Diagnosis not present

## 2013-12-05 DIAGNOSIS — Z7901 Long term (current) use of anticoagulants: Secondary | ICD-10-CM | POA: Diagnosis not present

## 2013-12-05 DIAGNOSIS — Z95810 Presence of automatic (implantable) cardiac defibrillator: Secondary | ICD-10-CM | POA: Insufficient documentation

## 2013-12-05 DIAGNOSIS — I4891 Unspecified atrial fibrillation: Secondary | ICD-10-CM

## 2013-12-05 LAB — LIPID PANEL
CHOL/HDL RATIO: 3 ratio
Cholesterol: 152 mg/dL (ref 0–200)
HDL: 50 mg/dL (ref >39)
LDL Cholesterol: 81 mg/dL (ref 0–99)
Triglycerides: 107 mg/dL (ref <150)
VLDL: 21 mg/dL (ref 0–40)

## 2013-12-05 LAB — COMPREHENSIVE METABOLIC PANEL
ALT: 18 U/L (ref 0–35)
AST: 15 U/L (ref 0–37)
Albumin: 3.4 g/dL — ABNORMAL LOW (ref 3.5–5.2)
Alkaline Phosphatase: 70 U/L (ref 39–117)
Anion gap: 14 (ref 5–15)
BUN: 34 mg/dL — ABNORMAL HIGH (ref 6–23)
CHLORIDE: 106 meq/L (ref 96–112)
CO2: 20 meq/L (ref 19–32)
Calcium: 8.7 mg/dL (ref 8.4–10.5)
Creatinine, Ser: 1.24 mg/dL — ABNORMAL HIGH (ref 0.50–1.10)
GFR calc Af Amer: 51 mL/min — ABNORMAL LOW (ref >90)
GFR, EST NON AFRICAN AMERICAN: 44 mL/min — AB (ref >90)
Glucose, Bld: 173 mg/dL — ABNORMAL HIGH (ref 70–99)
POTASSIUM: 4.3 meq/L (ref 3.7–5.3)
SODIUM: 140 meq/L (ref 137–147)
Total Bilirubin: 0.6 mg/dL (ref 0.3–1.2)
Total Protein: 7 g/dL (ref 6.0–8.3)

## 2013-12-05 LAB — TSH: TSH: 3.62 u[IU]/mL (ref 0.350–4.500)

## 2013-12-05 NOTE — Patient Instructions (Signed)
Labs today  You have been referred to Dr Ardis Hughs [facility] GI)  Your physician recommends that you schedule a follow-up appointment in: 4 months

## 2013-12-06 NOTE — Progress Notes (Signed)
Patient ID: [name], female   DOB: 1946/01/27, 68 y.o.   MRN: [id number] PCP: Webb Silversmith  68 yo with history of CAD, ischemic cardiomyopathy, and atrial fibrillation presents for cardiology followup.  Patient was hospitalized in 11/12 at [facility] for VT with ICD discharge.  She had a left heart cath showing patent stents.  EF was 35-40% by echo.  She is on amiodarone.  She has had periodic problems with creatinine rising with medication adjustments.  Repeat echo in 5/14 showed EF 40-45%.    She returns for follow up. She continues to have gassy abdominal discomfort.  This has been chronic.  She has been told that she has IBS.  She also continues to have left knee pain.  No chest pain, no tachypalpitations.  She can walk in her house and at stores without dyspnea.  She is short of breath if she has to walk a long distance or go up a flight of steps.    Labs (10/12): LDL 73, HDL 44 Labs (11/12): K 3.9, creatinine 1.1, LFTs normal, TSH normal Labs (12/12): K 3.9, creatinine 1.5, proBNP 18 Labs (1/13): LDL 82, HDL 57 Labs (2/13): K 4.3, creatinine 1.5 Labs (5/13): creatinine 2.4 => 1.7, LFTs normal, TSH normal, proBNP 18 Labs (6/13): K 4.2, creatinine 1.7 Labs (10/13): K 4.3, creatinine 1.4 Labs (4/14); LFTs normal, TSH normal, LDL 71, HDL 58 Labs (5/14): K 4.5, creatinine 1.4 Labs (8/14): TSH normal, LFTs normal Labs (9/14): K 4.2, creatinine 1.8, LDL 75, HDL 54 Labs (07/31/13) K 3.7 Creatinine 1.8 BNP 148   ECG (8/15): a-paced, v-sensed  PMH: 1. Diabetes mellitus 2. CVA, TIA in 2010 3. HTN 4. CKD 5. H/o TAH 6. H/o CCY 7. Sciatica 8. Atrial fibrillation 9. CAD: s/p LAD and RCA PCI.  Last LHC in 11/12 with patent proximal LAD stent, ostial 70% D1 (jailed by stent), mild LAD stent patent, patent RCA stents, EF 40% with global hypokinesis.  10. Ischemic cardiomyopathy: Echo (10/12): EF 35-40%, moderate focal basal septal hypertrophy, inferior akinesis, grade I diastolic  dysfunction, moderate aortic insufficiency. St Jude dual chamber ICD.  Spironolactone stopped when creatinine rose to 2.5.  Echo (5/14) with EF 40-45%, mild AI.  11. Aortic insufficiency: moderate in past but mild on most recent echo.   12. 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   SH: Divorced.  3 children.  Quit smoking in 1997. Lives in [place].  [place].   FH: CAD  ROS: All systems reviewed and negative except as per HPI.   Current Outpatient Prescriptions  Medication Sig Dispense Refill  . ACCU-CHEK SMARTVIEW test strip       . amiodarone (PACERONE) 200 MG tablet Take 200 mg by mouth daily.      . Blood Glucose Monitoring Suppl (ACCU-CHEK NANO SMARTVIEW) W/DEVICE KIT USE AS DIRECTED  1 kit  0  . carvedilol (COREG) 25 MG tablet Take 25 mg by mouth 2 (two) times daily with a meal.      . carvedilol (COREG) 25 MG tablet TAKE 1 TABLET (25 MG TOTAL) BY MOUTH 2 (TWO) TIMES DAILY WITH A MEAL.  60 tablet  1  . cetirizine (ZYRTEC) 10 MG tablet Take 10 mg by mouth daily.      . Cholecalciferol (VITAMIN D3) 2000 UNITS TABS Take 2,000 Units by mouth daily.       . CRESTOR 20 MG tablet TAKE 1 TABLET BY MOUTH EVERY DAY  30 tablet  1  . diclofenac sodium (VOLTAREN)  1 % GEL Apply 2 g topically 4 (four) times daily.      . furosemide (LASIX) 20 MG tablet Take 1 tablet (20 mg total) by mouth daily.  30 tablet  11  . gabapentin (NEURONTIN) 400 MG capsule Take 1 capsule (400 mg total) by mouth 2 (two) times daily.  180 capsule  3  . Insulin Detemir (LEVEMIR FLEXTOUCH) 100 UNIT/ML Pen Inject under skin 45 units in am and 60 units at bedtime  45 mL  1  . Insulin Detemir (LEVEMIR FLEXTOUCH) 100 UNIT/ML Pen Inject under skin 45 units in am and 60 units at bedtime  45 mL  3  . insulin lispro (HUMALOG) 100 UNIT/ML injection Inject 0.12-0.22 mLs (12-22 Units total) into the skin 3 (three) times daily before meals.  20 mL  11  . lisinopril (PRINIVIL,ZESTRIL) 5 MG tablet Take 5 mg by mouth daily.      [name] lisinopril  (PRINIVIL,ZESTRIL) 5 MG tablet TAKE 1 TABLET BY MOUTH DAILY  30 tablet  0  . Multiple Vitamins-Minerals (EYE VITAMINS PO) Take 1 tablet by mouth daily.       . nitrofurantoin, macrocrystal-monohydrate, (MACROBID) 100 MG capsule Take 100 mg by mouth 2 (two) times daily.      . nitroGLYCERIN (NITROSTAT) 0.4 MG SL tablet Place 0.4 mg under the tongue every 5 (five) minutes as needed for chest pain.      . nizatidine (AXID) 150 MG capsule TAKE 1 CAPSULE (150 MG TOTAL) BY MOUTH DAILY.  90 capsule  0  . nizatidine (AXID) 150 MG capsule TAKE 1 CAPSULE (150 MG TOTAL) BY MOUTH DAILY.  90 capsule  0  . omeprazole (PRILOSEC) 20 MG capsule TAKE 1 CAPSULE (20 MG TOTAL) BY MOUTH DAILY.  30 capsule  5  . ondansetron (ZOFRAN ODT) 4 MG disintegrating tablet Take 1 tablet (4 mg total) by mouth every 8 (eight) hours as needed for nausea or vomiting.  20 tablet  0  . rosuvastatin (CRESTOR) 20 MG tablet Take 20 mg by mouth daily.      [name] sulfamethoxazole-trimethoprim (BACTRIM DS) 800-160 MG per tablet Take 1 tablet by mouth 2 (two) times daily.  8 tablet  0  . Vitamin D, Ergocalciferol, (DRISDOL) 50000 UNITS CAPS capsule Take 50,000 Units by mouth every 7 (seven) days. monday      . Vitamin D, Ergocalciferol, (DRISDOL) 50000 UNITS CAPS capsule TAKE ONE CAPSULE BY MOUTH ONCE A WEEK  4 capsule  5  . warfarin (COUMADIN) 2.5 MG tablet TAKE AS DIRECTED BY COUMADIN CLINIC  30 tablet  2   No current facility-administered medications for this encounter.    BP 120/56  Pulse 80  Wt 211 lb 1.9 oz (95.763 kg)  SpO2 96% General: NAD, obese Neck: Thick, JVP 5-6 cm, no thyromegaly or thyroid nodule.  Lungs: Clear to auscultation bilaterally with normal respiratory effort. CV: Nondisplaced PMI.  Heart regular S1/S2, no S3/S4, 2/6 early SEM.  Trace ankle edema.  No carotid bruit.  Normal pedal pulses.  Abdomen: Obese, Soft, nontender, no hepatosplenomegaly, no distention.  Neurologic: Alert and oriented x 3.  Psych: Normal  affect. Extremities: No clubbing or cyanosis.   Assessment/Plan 1. Atrial fibrillation  Paroxysmal, she is in NSR today. Continue amiodarone 200 mg daily. Continue warfarin. Has history of  CVA, needs Lovenox bridge if she has to stop warfarin at any point of time.  Needs yearly eye exams. Check TSH and LFTs today.  2. Coronary artery disease   No  evidence of ischemia. Nonobstructive disease on last cath. As she is on warfarin also with stable CAD, she is not on aspirin. Continue statin, good lipids in 9/14.  3. Ventricular tachycardia On amiodarone with suppression of VT.  4. Ischemic cardiomyopathy NYHA class II symptoms, stable.  She is not volume overloaded.  - Continue current dose of lasix.   - Continue current doses of Coreg and lisinopril.  She is off spironolactone since recent AKI episode. Would not titrate lisinopril today with CKD.  5. CKD: Repeat BMET today.  6. Abdominal Pain: ?IBS.  She wants referral back to GI, which I will provide.     Loralie Champagne 12/06/2013

## 2013-12-08 LAB — HM DIABETES EYE EXAM

## 2013-12-12 ENCOUNTER — Encounter: Payer: Self-pay | Admitting: Internal Medicine

## 2013-12-17 ENCOUNTER — Other Ambulatory Visit: Payer: Self-pay | Admitting: Cardiology

## 2013-12-20 ENCOUNTER — Other Ambulatory Visit: Payer: Self-pay | Admitting: Cardiology

## 2013-12-25 ENCOUNTER — Ambulatory Visit (INDEPENDENT_AMBULATORY_CARE_PROVIDER_SITE_OTHER): Payer: Commercial Managed Care - HMO | Admitting: *Deleted

## 2013-12-25 DIAGNOSIS — Z7901 Long term (current) use of anticoagulants: Secondary | ICD-10-CM

## 2013-12-25 DIAGNOSIS — Z5181 Encounter for therapeutic drug level monitoring: Secondary | ICD-10-CM

## 2013-12-25 DIAGNOSIS — I4891 Unspecified atrial fibrillation: Secondary | ICD-10-CM

## 2013-12-25 LAB — POCT INR: INR: 3.4

## 2013-12-26 ENCOUNTER — Other Ambulatory Visit: Payer: Self-pay | Admitting: Cardiology

## 2013-12-26 ENCOUNTER — Other Ambulatory Visit: Payer: Self-pay | Admitting: Internal Medicine

## 2014-01-16 ENCOUNTER — Other Ambulatory Visit: Payer: Self-pay | Admitting: *Deleted

## 2014-01-16 ENCOUNTER — Encounter: Payer: Self-pay | Admitting: Internal Medicine

## 2014-01-16 ENCOUNTER — Ambulatory Visit (INDEPENDENT_AMBULATORY_CARE_PROVIDER_SITE_OTHER): Payer: Commercial Managed Care - HMO

## 2014-01-16 ENCOUNTER — Telehealth: Payer: Self-pay | Admitting: Internal Medicine

## 2014-01-16 ENCOUNTER — Telehealth: Payer: Self-pay | Admitting: *Deleted

## 2014-01-16 ENCOUNTER — Ambulatory Visit (INDEPENDENT_AMBULATORY_CARE_PROVIDER_SITE_OTHER): Payer: Commercial Managed Care - HMO | Admitting: Internal Medicine

## 2014-01-16 VITALS — BP 112/68 | HR 86 | Temp 97.5°F | Resp 12 | Wt 214.0 lb

## 2014-01-16 DIAGNOSIS — Z7901 Long term (current) use of anticoagulants: Secondary | ICD-10-CM

## 2014-01-16 DIAGNOSIS — E1151 Type 2 diabetes mellitus with diabetic peripheral angiopathy without gangrene: Secondary | ICD-10-CM

## 2014-01-16 DIAGNOSIS — E1165 Type 2 diabetes mellitus with hyperglycemia: Principal | ICD-10-CM

## 2014-01-16 DIAGNOSIS — Z5181 Encounter for therapeutic drug level monitoring: Secondary | ICD-10-CM

## 2014-01-16 DIAGNOSIS — IMO0002 Reserved for concepts with insufficient information to code with codable children: Secondary | ICD-10-CM

## 2014-01-16 DIAGNOSIS — I4891 Unspecified atrial fibrillation: Secondary | ICD-10-CM

## 2014-01-16 DIAGNOSIS — E1159 Type 2 diabetes mellitus with other circulatory complications: Secondary | ICD-10-CM

## 2014-01-16 DIAGNOSIS — Z23 Encounter for immunization: Secondary | ICD-10-CM

## 2014-01-16 LAB — HEMOGLOBIN A1C: HEMOGLOBIN A1C: 8.1 % — AB (ref 4.6–6.5)

## 2014-01-16 LAB — POCT INR: INR: 3.5

## 2014-01-16 MED ORDER — METFORMIN HCL 500 MG PO TABS
1000.0000 mg | ORAL_TABLET | Freq: Two times a day (BID) | ORAL | Status: DC
Start: 2014-01-16 — End: 2014-04-19

## 2014-01-16 MED ORDER — INSULIN DETEMIR 100 UNIT/ML FLEXPEN: PEN_INJECTOR | SUBCUTANEOUS | Status: DC

## 2014-01-16 MED ORDER — WARFARIN SODIUM 1 MG PO TABS: 1.0000 mg | ORAL_TABLET | Freq: Every day | ORAL | Status: DC

## 2014-01-16 MED ORDER — INSULIN LISPRO 100 UNIT/ML ~~LOC~~ SOLN: 12.0000 [IU] | Freq: Three times a day (TID) | SUBCUTANEOUS | Status: DC

## 2014-01-16 NOTE — Telephone Encounter (Signed)
Pt's pharmacy called stating that the pt's insurance will not cover the medication (DAW) that you sent in, Metformin; but they will cover the generic. Please advise.

## 2014-01-16 NOTE — Telephone Encounter (Signed)
Called pharmacy and advised ok to change to generic.

## 2014-01-16 NOTE — Patient Instructions (Signed)
Decrease Levemir to 40 units in am and 50 units at bedtime.  Increase Humalog:  15 units for a smaller meal  18 units for a medium meal  23 units before a large meal or dinner  Change the Humalog sliding scale:  - 150- 165: + 2 unit  - 166- 180: + 3 units  - 181- 195: + 4 units  - 196- 210: + 5 units  - >210: + 6 units  Please start Metformin 500 mg with dinner x 4 days. If you tolerate this well, add another Metformin tablet (500 mg) with breakfast x 4 days. If you tolerate this well, add another metformin tablet with dinner (total 1000 mg) x 4 days. If you tolerate this well, add another metformin tablet with breakfast (total 1000 mg). Continue with 1000 mg of metformin 2x a day with breakfast and dinner.  Please return in 3 months with your sugar log. Please call me or message me if sugars stay <80 or >200 consistently.  Please stop at the lab.

## 2014-01-16 NOTE — Telephone Encounter (Signed)
OK for generic

## 2014-01-16 NOTE — Progress Notes (Signed)
Patient ID: [name], female   DOB: Jan 08, 1946, 68 y.o.   MRN: [id number]   HPI: [name] is a 68 y.o.-year-old female, returning for f/u for DM2, insulin-dependent, uncontrolled, with complications (CAD, ICM - had ICD, peripheral neuropathy, gastroparesis). Last visit 3 mo ago.  She c/o bilateral knee pain and swelling "nothing helps".   Last hemoglobin A1c was: Lab Results  Component Value Date   HGBA1C 7.8* 10/16/2013   HGBA1C 8.2* 04/03/2013   HGBA1C 9.1* 12/15/2012  07/03/2013: 7.2% - through her insurance   Pt is on a regimen of: - Levemir 50 >> 45 units in am and 55 >> 60 in hs - pen  - Apidra 14-16-20 (depending on size of meal) with a meal - vial - + added SSI at last visit target 150, ISF 15 She was on Metformin in the past >> tolerated it well.  Pt checks her sugars 3-4 a day and they are higher - brings a good log: - am: low 100-low 200s >> 148-234 >> 170-268 >> 141-238 >> 180-214 >> 134-271 >> 104-189 in the last 2 weeks, before 132-236 >> 148-246, 260 - before lunch: 180s >> 68-200 >> 68-330 (130-175) >> 56 (one value) 100-215 >> 103-256 (most <170) >> 63, 76, 140-225 >> 57 x1, 66-160, spikes at 200 >> 58x1, 90,240-973 - after lunch: 55-171 >> n/c >> 100 >> n/c >> n/c - before dinner: 90s-150 >> 61-188 >> 69-277 (120-180) >> 84-197 (best sugars) >> 64-206 (most 100-170) >> 70, 86, 99-224 >> 74-212, 1x 265 >> 94-201 - bedtime: 100-150 >>112-279 >> 92-249 >> 110-230 >> 92-211 (most 130-180), one 321 >> 85-265 >> 79-200, 220 x 1, 237, 307 x1 >> 60, 101-188, 241 Lowest sugar was 58  she has hypoglycemia awareness at 70. Highest sugar was 544 >> 358 >> 405 >> 307 >> 275.   Pt's meals are: - Breakfast: small bowl of cereal frosted with 2% milk - Lunch (5-14 units): vegetable + starch; turkey/ham sandwich - Dinner (20 units): fast food or cooks: meat + vegetables + sometimes potatoes - Snacks: fruit (apple) Did not see nutrition yet >> did not have  transportation She has a glass of 2% milk before bedtime. Tried Almond milk but did not like it as it was too sweet.  - Has CKD, last BUN/creatinine:  Lab Results  Component Value Date   BUN 34* 12/05/2013   CREATININE 1.24* 12/05/2013  ACR in 12/15/2012: 1.0.  - last set of lipids: Lab Results  Component Value Date   CHOL 152 12/05/2013   HDL 50 12/05/2013   LDLCALC 81 12/05/2013   TRIG 107 12/05/2013   CHOLHDL 3.0 12/05/2013  she is on Crestor. - last eye exam was in 11/2013. No DR.  - + numbness and tingling in her feet - on neurontin.  I reviewed pt's medications, allergies, PMH, social hx, family hx and no changes required, except as mentioned above.  ROS: Constitutional: + weight gain, no fatigue, no subjective hyperthermia/hypothermia Eyes: no blurry vision, no xerophthalmia ENT: no sore throat, no nodules palpated in throat, no dysphagia/odynophagia, no hoarseness Cardiovascular: no CP/+ SOB/no palpitations/+ leg swelling Respiratory: no cough/+ SOB Gastrointestinal: + N/+ V/+ D/no C Musculoskeletal: + all: muscle/joint aches and swelling Skin: no rashes, + excessive hair growth  PE: BP 112/68  Pulse 86  Temp(Src) 97.5 F (36.4 C) (Oral)  Resp 12  Wt 214 lb (97.07 kg)  SpO2 95% Body mass index is 37.92 kg/(m^2).  Wt Readings  from Last 3 Encounters:  01/16/14 214 lb (97.07 kg)  12/05/13 211 lb 1.9 oz (95.763 kg)  11/09/13 212 lb 12.8 oz (96.525 kg)   Constitutional: overweight, in NAD, full supraclavicular fat pads Eyes: PERRLA, EOMI, no exophthalmos ENT: moist mucous membranes, no thyromegaly, no cervical lymphadenopathy Cardiovascular: RRR, No MRG, + pitting LE edema Respiratory: CTA B Gastrointestinal: abdomen soft, NT, ND, BS+ Musculoskeletal: no deformities, strength intact in all 4 Skin: moist, warm, no rashes  ASSESSMENT: 1. DM2, insulin-dependent, uncontrolled, with complications - CAD, ICM - s/p ICD placement - CKD - PN - on neurontin -  gastroparesis per GES 06/18/2012 >> 60 minutes: 92%, 120 minutes: 82%  PLAN:  1. Patient with long-standing, uncontrolled diabetes, on basal bolus regimen, with still high sugars: - We discussed about options for treatment, and I suggested to increase the mealtime insulin, decrease the basal insulin and add Metformin:    Patient Instructions  Decrease Levemir to 40 units in am and 50 units at bedtime.  Increase Humalog:  15 units for a smaller meal  18 units for a medium meal  23 units before a large meal or dinner  Change the Humalog sliding scale:  - 150- 165: + 2 unit  - 166- 180: + 3 units  - 181- 195: + 4 units  - 196- 210: + 5 units  - >210: + 6 units  Please start Metformin 500 mg with dinner x 4 days. If you tolerate this well, add another Metformin tablet (500 mg) with breakfast x 4 days. If you tolerate this well, add another metformin tablet with dinner (total 1000 mg) x 4 days. If you tolerate this well, add another metformin tablet with breakfast (total 1000 mg). Continue with 1000 mg of metformin 2x a day with breakfast and dinner.  Please return in 3 months with your sugar log. Please call me or message me if sugars stay <80 or >200 consistently.  Please stop at the lab.  - continue checking her sugars at different times of the day - check 4 times a day - up to date with eye exams  - given new sugar logs - will check a HbA1c today - will give the flu vaccine today - Return to clinic in 3 months with sugar log   Office Visit on 01/16/2014  Component Date Value Ref Range Status  . Hemoglobin A1C 01/16/2014 8.1* 4.6 - 6.5 % Final   Glycemic Control Guidelines for People with Diabetes:Non Diabetic:  <6%Goal of Therapy: <7%Additional Action Suggested:  >8%    HbA1c worse >> please see plan above.

## 2014-01-17 ENCOUNTER — Telehealth: Payer: Self-pay | Admitting: Internal Medicine

## 2014-01-17 NOTE — Telephone Encounter (Signed)
Needs [employer] referral dates back to 10/1 to cover INR sent to [facility] coum clinic.

## 2014-01-18 NOTE — Telephone Encounter (Signed)
Returned pt's call and clarified how to take the Metformin. Pt understood.

## 2014-01-18 NOTE — Telephone Encounter (Signed)
Patient stated that she was not clear about her instruction on her med bottle . please advise

## 2014-01-18 NOTE — Telephone Encounter (Signed)
Retro-referral placed for Dr. Rayann Heman. No referral is required for the coumadin clinic. If there is a pre-certification required, the cardiology office will address that.

## 2014-01-18 NOTE — Telephone Encounter (Signed)
Left msg on vm to make pt aware.

## 2014-01-23 ENCOUNTER — Encounter: Payer: Self-pay | Admitting: Internal Medicine

## 2014-01-23 ENCOUNTER — Ambulatory Visit (INDEPENDENT_AMBULATORY_CARE_PROVIDER_SITE_OTHER): Payer: Commercial Managed Care - HMO | Admitting: Internal Medicine

## 2014-01-23 VITALS — BP 142/78 | HR 93 | Temp 98.2°F | Ht 63.0 in | Wt 216.1 lb

## 2014-01-23 DIAGNOSIS — M545 Low back pain, unspecified: Secondary | ICD-10-CM | POA: Insufficient documentation

## 2014-01-23 DIAGNOSIS — E1165 Type 2 diabetes mellitus with hyperglycemia: Secondary | ICD-10-CM

## 2014-01-23 DIAGNOSIS — M25561 Pain in right knee: Secondary | ICD-10-CM

## 2014-01-23 DIAGNOSIS — E1159 Type 2 diabetes mellitus with other circulatory complications: Secondary | ICD-10-CM

## 2014-01-23 DIAGNOSIS — E1151 Type 2 diabetes mellitus with diabetic peripheral angiopathy without gangrene: Secondary | ICD-10-CM

## 2014-01-23 DIAGNOSIS — M25562 Pain in left knee: Secondary | ICD-10-CM

## 2014-01-23 DIAGNOSIS — R109 Unspecified abdominal pain: Secondary | ICD-10-CM | POA: Insufficient documentation

## 2014-01-23 DIAGNOSIS — IMO0002 Reserved for concepts with insufficient information to code with codable children: Secondary | ICD-10-CM

## 2014-01-23 MED ORDER — TRAMADOL HCL 50 MG PO TABS: 50.0000 mg | ORAL_TABLET | Freq: Four times a day (QID) | ORAL | Status: DC | PRN

## 2014-01-23 MED ORDER — PREDNISONE 10 MG PO TABS: ORAL_TABLET | ORAL | Status: DC

## 2014-01-23 NOTE — Assessment & Plan Note (Signed)
Mod to severe, likely related to underlying lumbar DJD or DDD,  for pain control, predpac asd,  to f/u any worsening symptoms or concerns

## 2014-01-23 NOTE — Patient Instructions (Signed)
Please take all new medication as prescribed - the pain medication, and the prednisone (anti-inflammatory)  You will be contacted regarding the referral for: Dr Percell Miller, Dr Cruzita Lederer , and Dr Ardis Hughs as requested  Please continue all other medications as before, and refills have been done if requested.  Please have the pharmacy call with any other refills you may need.  Please keep your appointments with your specialists as you may have planned

## 2014-01-23 NOTE — Progress Notes (Signed)
Pre visit review using our clinic review tool, if applicable. No additional management support is needed unless otherwise documented below in the visit note. 

## 2014-01-23 NOTE — Assessment & Plan Note (Signed)
Persistent ongoing, for GI referral per pt reqeust

## 2014-01-23 NOTE — Progress Notes (Signed)
Subjective:    Patient ID: [name], female    DOB: May 23, 1945, 68 y.o.   MRN: [id number]  HPI  Here to f/u, former pt of nancy Hartman, c/o 2-3 mo worsening bilat knee sharp constant pain with persistent swelling, worse to walk with shopping and cant hardly get up the next day.  Walks ok from parking lot into the office.  No fever, trauma, giveaways or falls.  Pt continues to have recurring LBP without change in severity, bowel or bladder change, fever, wt loss,  worsening LE pain/numbness/weakness, gait change or falls., though has had some left > right sciatica approx 3 yrs, improved since then.   Has seen Dr Bonney Aid for approx 1 yr (just over 1 yrs ago), s/p ESI in back, also cortisone to knee, but both helped only mildly, limited help for 2-3 days only.  Tx with tramadol which did take the edge off.  LS spine MRI with spinal stenosis, DJD and DDD.  Has been through PT as well.  Also asks for insurance required referral to endo for DM, and GI for left sided abd pain Past Medical History  Diagnosis Date  . Chronic systolic heart failure   . Diabetes mellitus     type 1  . Hypertension   . Stroke 2010    eye doctor said she had TIA  . Ischemic cardiomyopathy   . Ventricular tachycardia     Polymorphic  . 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   . Atrial fibrillation     controlled with amiodarone, on coumadin  . Coronary artery disease 01/31/2011  . Chronic renal insufficiency   . Lumbar spondylosis 01/11/2012  . History of chicken pox   . Allergy   . Hyperlipidemia    Past Surgical History  Procedure Laterality Date  . Abdominal hysterectomy  1995  . Cholecystectomy  1985  . Tubaligation  1980  . Icd  2003/2007    implanted by Dr Rollene Fare, most recent generator change 2/13 by Dr Rayann Heman, Analyze ST study patient    reports that she quit smoking about 18 years ago. She has never used smokeless tobacco. She reports that she does not drink alcohol or use  illicit drugs. family history includes Diabetes in her mother; Early death (age of onset: 14) in her brother; Heart attack in her father; Heart disease in her father and mother; Hyperlipidemia in her mother; Hypertension in her father and mother. Allergies  Allergen Reactions  . Darvon Other (See Comments)    indigestion  . Promethazine Hcl Other (See Comments)    hyperactivity  . Plavix [Clopidogrel Bisulfate] Rash   Review of Systems  Constitutional: Negative for unusual diaphoresis or other sweats  HENT: Negative for ringing in ear Eyes: Negative for double vision or worsening visual disturbance.  Respiratory: Negative for choking and stridor.   Gastrointestinal: Negative for vomiting or other signifcant bowel change Genitourinary: Negative for hematuria or decreased urine volume.  Musculoskeletal: Negative for other MSK pain or swelling Skin: Negative for color change and worsening wound.  Neurological: Negative for tremors and numbness other than noted  Psychiatric/Behavioral: Negative for decreased concentration or agitation other than above       Objective:   Physical Exam BP 142/78 mmHg  Pulse 93  Temp(Src) 98.2 F (36.8 C) (Oral)  Ht 5\' 3"  (1.6 m)  Wt 216 lb 2 oz (98.034 kg)  BMI 38.29 kg/m2  SpO2 96% VS noted,  Constitutional: Pt appears well-developed, well-nourished.  HENT: Head:  NCAT.  Right Ear: External ear normal.  Left Ear: External ear normal.  Eyes: . Pupils are equal, round, and reactive to light. Conjunctivae and EOM are normal Neck: Normal range of motion. Neck supple.  Cardiovascular: Normal rate and regular rhythm.   Pulmonary/Chest: Effort normal and breath sounds normal.  Abd:  Soft, NT, ND, + BS Spine tender area midline l4-5-s1 area, without swelling, erythema, rash Neurological: Pt is alert. Not confused , motor grossly intact except for distal RLE 4+/5 - but not clear if holding back due to pain Bilat knees with crepitus, small  effusions Skin: Skin is warm. No rash Psychiatric: Pt behavior is normal. No agitation.     Assessment & Plan:

## 2014-01-23 NOTE — Assessment & Plan Note (Addendum)
stable overall by history and exam, recent data reviewed with pt, and pt to continue medical treatment as before,  to f/u any worsening symptoms or concerns, for endo referral as required by insurance Lab Results  Component Value Date   HGBA1C 8.1* 01/16/2014

## 2014-01-23 NOTE — Assessment & Plan Note (Signed)
C/w with prob progressive DJD vs cartilage dz, for pain control, also refer ortho for f/u

## 2014-01-24 NOTE — Addendum Note (Signed)
Addended by: Biagio Borg on: 01/24/2014 09:25 PM   Modules accepted: Miquel Dunn

## 2014-01-30 ENCOUNTER — Ambulatory Visit (INDEPENDENT_AMBULATORY_CARE_PROVIDER_SITE_OTHER): Payer: Commercial Managed Care - HMO | Admitting: Pharmacist Clinician (PhC)/ Clinical Pharmacy Specialist

## 2014-01-30 DIAGNOSIS — I4891 Unspecified atrial fibrillation: Secondary | ICD-10-CM

## 2014-01-30 DIAGNOSIS — Z5181 Encounter for therapeutic drug level monitoring: Secondary | ICD-10-CM

## 2014-01-30 DIAGNOSIS — Z7901 Long term (current) use of anticoagulants: Secondary | ICD-10-CM

## 2014-01-30 LAB — POCT INR: INR: 3.3

## 2014-02-06 ENCOUNTER — Ambulatory Visit (INDEPENDENT_AMBULATORY_CARE_PROVIDER_SITE_OTHER): Payer: Commercial Managed Care - HMO | Admitting: Gastroenterology

## 2014-02-06 ENCOUNTER — Encounter: Payer: Self-pay | Admitting: Gastroenterology

## 2014-02-06 VITALS — BP 100/58 | HR 64 | Ht 63.0 in | Wt 210.4 lb

## 2014-02-06 DIAGNOSIS — R1012 Left upper quadrant pain: Secondary | ICD-10-CM

## 2014-02-06 MED ORDER — GLYCOPYRROLATE 1 MG PO TABS: 1.0000 mg | ORAL_TABLET | Freq: Two times a day (BID) | ORAL | Status: DC

## 2014-02-06 NOTE — Progress Notes (Signed)
Review of pertinent gastrointestinal problems: 1. Gastroparesis, documented by slow gastric emptying on March 2007 gastric emptying scan.  Likely from longstanding diabetes. Esophagogastroduodenoscopy September 2007 essentially normal. 2. Chronic left lower quadrant pains.  Unclear etiology, possiblyrelated to her longstanding gastroparesis.  Abdominal ultrasound March 2007 showed nonspecific hepatomegaly, stent in her right renal system, otherwise essentially normal.  CT scan with IV and oral contrast September 2007 was essentially normal except for some mild constipation. Colonoscopy October 2007 normal, next colonoscopy for CRC screening October 2017.   HPI: This is a    Very pleasant 68 year old woman whom I last saw many years ago.  She says she is puffed up, bloated.  She has same discomfort, burning in left abdomen. Constantly gurgling   She usually has very loose stools.  This is not new.     She is never constipated.    She has gained 100 pounds in 7-8 (her report).  50 pounds in 7 years on our scale.   Review of systems: Pertinent positive and negative review of systems were noted in the above HPI section. Complete review of systems was performed and was otherwise normal.    Past Medical History  Diagnosis Date  . Chronic systolic heart failure   . Diabetes mellitus     type 1  . Hypertension   . Stroke 2010    eye doctor said she had TIA  . Ischemic cardiomyopathy   . Ventricular tachycardia     Polymorphic  . 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   . Atrial fibrillation     controlled with amiodarone, on coumadin  . Coronary artery disease 01/31/2011  . Chronic renal insufficiency   . Lumbar spondylosis 01/11/2012  . History of chicken pox   . Allergy   . Hyperlipidemia   . Arthritis   . Congestive heart failure     Past Surgical History  Procedure Laterality Date  . Abdominal hysterectomy  1995  . Cholecystectomy  1985  . Tubaligation  1980  .  Icd  2003/2007    implanted by Dr Rollene Fare, most recent generator change 2/13 by Dr Rayann Heman, Analyze ST study patient  . Cardiac defibrillator placement      Current Outpatient Prescriptions  Medication Sig Dispense Refill  . ACCU-CHEK SMARTVIEW test strip     . amiodarone (PACERONE) 200 MG tablet TAKE 1 TABLET (200 MG TOTAL) BY MOUTH DAILY. 60 tablet 2  . Blood Glucose Monitoring Suppl (ACCU-CHEK NANO SMARTVIEW) W/DEVICE KIT USE AS DIRECTED 1 kit 0  . carvedilol (COREG) 25 MG tablet TAKE 1 TABLET BY MOUTH TWICE A DAY WITH A MEAL 60 tablet 1  . cetirizine (ZYRTEC) 10 MG tablet Take 10 mg by mouth daily.    . Cholecalciferol (VITAMIN D3) 2000 UNITS TABS Take 2,000 Units by mouth daily.     . diclofenac sodium (VOLTAREN) 1 % GEL Apply 2 g topically 4 (four) times daily.    . furosemide (LASIX) 20 MG tablet Take 1 tablet (20 mg total) by mouth daily. 30 tablet 11  . gabapentin (NEURONTIN) 400 MG capsule Take 1 capsule (400 mg total) by mouth 2 (two) times daily. (Patient taking differently: Take 400 mg by mouth 2 (two) times daily after a meal. ) 180 capsule 3  . Insulin Detemir (LEVEMIR FLEXTOUCH) 100 UNIT/ML Pen Inject under skin 40 units in am and 50 units at bedtime 45 mL 1  . insulin lispro (HUMALOG) 100 UNIT/ML injection Inject 0.12-0.3 mLs (12-30 Units total) into  the skin 3 (three) times daily before meals. 20 mL 11  . lisinopril (PRINIVIL,ZESTRIL) 5 MG tablet TAKE 1 TABLET BY MOUTH DAILY 30 tablet 6  . metFORMIN (GLUCOPHAGE) 500 MG tablet Take 2 tablets (1,000 mg total) by mouth 2 (two) times daily with a meal. 120 tablet 3  . Multiple Vitamins-Minerals (EYE VITAMINS PO) Take 1 tablet by mouth daily.     . nitroGLYCERIN (NITROSTAT) 0.4 MG SL tablet Place 0.4 mg under the tongue every 5 (five) minutes as needed for chest pain.    . nizatidine (AXID) 150 MG capsule TAKE 1 CAPSULE (150 MG TOTAL) BY MOUTH DAILY. 90 capsule 0  . omeprazole (PRILOSEC) 20 MG capsule TAKE 1 CAPSULE (20 MG TOTAL)  BY MOUTH DAILY. 30 capsule 5  . rosuvastatin (CRESTOR) 20 MG tablet Take 20 mg by mouth daily.    . traMADol (ULTRAM) 50 MG tablet Take 1 tablet (50 mg total) by mouth every 6 (six) hours as needed. 120 tablet 1  . Vitamin D, Ergocalciferol, (DRISDOL) 50000 UNITS CAPS capsule Take 50,000 Units by mouth every 7 (seven) days. monday    . warfarin (COUMADIN) 1 MG tablet Take 1 tablet (1 mg total) by mouth daily. (Patient taking differently: Take 1 mg by mouth daily. Pt currently taking 2.5 mg, but is being reduced down to 1 mg once pt finish medicine currently on.) 40 tablet 3   No current facility-administered medications for this visit.    Allergies as of 02/06/2014 - Review Complete 02/06/2014  Allergen Reaction Noted  . Darvon Other (See Comments) 01/27/2011  . Promethazine hcl Other (See Comments) 01/27/2011  . Plavix [clopidogrel bisulfate] Rash 01/27/2011    Family History  Problem Relation Age of Onset  . Diabetes Mother   . Heart disease Mother   . Hyperlipidemia Mother   . Hypertension Mother   . Heart disease Father   . Heart attack Father   . Hypertension Father   . Early death Brother 74  . Breast cancer Sister   . Lung cancer Sister   . Irritable bowel syndrome Sister   . Esophageal cancer Neg Hx   . Colon cancer Maternal Aunt   . Colon polyps Neg Hx     History   Social History  . Marital Status: Divorced    Spouse Name: N/A    Number of Children: 3  . Years of Education: 12   Occupational History  . Retried    Social History Main Topics  . Smoking status: Former Smoker    Quit date: 08/16/1995  . Smokeless tobacco: Never Used  . Alcohol Use: No  . Drug Use: No  . Sexual Activity: No   Other Topics Concern  . Not on file   Social History Narrative   Regular exercise-no   Caffeine Use-no       Physical Exam: BP 100/58 mmHg  Pulse 64  Ht $R'5\' 3"'Mt$  (1.6 m)  Wt 210 lb 6 oz (95.425 kg)  BMI 37.28 kg/m2 Constitutional: generally  well-appearing Psychiatric: alert and oriented x3 Eyes: extraocular movements intact Mouth: oral pharynx moist, no lesions Neck: supple no lymphadenopathy Cardiovascular: heart regular rate and rhythm Lungs: clear to auscultation bilaterally Abdomen: soft, nontender, nondistended, no obvious ascites, no peritoneal signs, normal bowel sounds Extremities: no lower extremity edema bilaterally Skin: no lesions on visible extremities    Assessment and plan: 68 y.o. female with  Bloating, left-sided abdominal discomfort for many years, fairly loose stools chronically  I worked upper  left-sided abdominal pains 7 or 8 years ago with CAT scan, colonoscopy, ultrasound, lab testing. Those were results were all essentially normal. Sounds she is having the same discomfort. Perhaps this is bowel spasm. She did have a CT scan 6 months ago and no bowel irregularities were noted. I recommended we try antispasm medicine 1 pill twice daily and she will call to report on her symptoms and 4-5 weeks. She is getting no response and I would like to repeat colonoscopy. It has been about 8 years since her last one.

## 2014-02-06 NOTE — Patient Instructions (Signed)
Trial of robinol (antispasm medicine).  One pill twice daily. Call back in 4-5 weeks to report on your response. If you are still very bothered by the left sided pains, then should proceed with colonoscopy.

## 2014-02-08 ENCOUNTER — Ambulatory Visit (INDEPENDENT_AMBULATORY_CARE_PROVIDER_SITE_OTHER): Payer: Commercial Managed Care - HMO | Admitting: *Deleted

## 2014-02-08 DIAGNOSIS — I472 Ventricular tachycardia: Secondary | ICD-10-CM

## 2014-02-08 DIAGNOSIS — I255 Ischemic cardiomyopathy: Secondary | ICD-10-CM

## 2014-02-08 DIAGNOSIS — I4729 Other ventricular tachycardia: Secondary | ICD-10-CM

## 2014-02-08 NOTE — Progress Notes (Signed)
Remote ICD transmission.   

## 2014-02-09 LAB — MDC_IDC_ENUM_SESS_TYPE_REMOTE
Brady Statistic RA Percent Paced: 98 %
Brady Statistic RV Percent Paced: 3.9 %
HIGH POWER IMPEDANCE MEASURED VALUE: 48 Ohm
Implantable Pulse Generator Model: 2241
Implantable Pulse Generator Serial Number: 819806
Lead Channel Impedance Value: 450 Ohm
Lead Channel Pacing Threshold Amplitude: 0.5 V
Lead Channel Pacing Threshold Pulse Width: 0.5 ms
Lead Channel Sensing Intrinsic Amplitude: 11.8 mV
Lead Channel Sensing Intrinsic Amplitude: 3.9 mV
Lead Channel Setting Pacing Amplitude: 2 V
Lead Channel Setting Pacing Amplitude: 2.5 V
Lead Channel Setting Sensing Sensitivity: 0.5 mV
MDC IDC MSMT LEADCHNL RV IMPEDANCE VALUE: 540 Ohm
MDC IDC SET LEADCHNL RV PACING PULSEWIDTH: 0.5 ms
Zone Setting Detection Interval: 270 ms
Zone Setting Detection Interval: 340 ms

## 2014-02-21 ENCOUNTER — Encounter: Payer: Self-pay | Admitting: Cardiology

## 2014-02-23 ENCOUNTER — Ambulatory Visit (INDEPENDENT_AMBULATORY_CARE_PROVIDER_SITE_OTHER): Payer: Medicare HMO | Admitting: Family Medicine

## 2014-02-23 ENCOUNTER — Ambulatory Visit (INDEPENDENT_AMBULATORY_CARE_PROVIDER_SITE_OTHER)
Admission: RE | Admit: 2014-02-23 | Discharge: 2014-02-23 | Disposition: A | Discharge status: Home / Self Care | Payer: Commercial Managed Care - HMO | Source: Ambulatory Visit | Attending: Family Medicine | Admitting: Family Medicine

## 2014-02-23 ENCOUNTER — Encounter: Payer: Self-pay | Admitting: Family Medicine

## 2014-02-23 VITALS — BP 106/60 | HR 81 | Ht 63.0 in | Wt 212.0 lb

## 2014-02-23 DIAGNOSIS — M25562 Pain in left knee: Secondary | ICD-10-CM

## 2014-02-23 DIAGNOSIS — M7050 Other bursitis of knee, unspecified knee: Secondary | ICD-10-CM

## 2014-02-23 DIAGNOSIS — M7150 Other bursitis, not elsewhere classified, unspecified site: Secondary | ICD-10-CM

## 2014-02-23 DIAGNOSIS — M545 Low back pain, unspecified: Secondary | ICD-10-CM

## 2014-02-23 DIAGNOSIS — M25561 Pain in right knee: Secondary | ICD-10-CM

## 2014-02-23 DIAGNOSIS — M4806 Spinal stenosis, lumbar region: Secondary | ICD-10-CM

## 2014-02-23 DIAGNOSIS — M48061 Spinal stenosis, lumbar region without neurogenic claudication: Secondary | ICD-10-CM | POA: Insufficient documentation

## 2014-02-23 HISTORY — DX: Other bursitis of knee, unspecified knee: M70.50

## 2014-02-23 NOTE — Assessment & Plan Note (Signed)
Patient does have lower back pain. Unfortunately I do not have any significant imaging to this. Patient has seen another provider for this previously and has had epidural steroid injections as well as formal physical therapy without any significant improvement. I believe that the lack of core strengthening patient's body habitus is also contributing. There is probably some underlying osteophytic changes also noted. Patient has had compression fractures previously of the thoracic spine that we could seen so this could be a continuation. X-rays were ordered today. Home exercises were given and we discussed other over-the-counter medications that could be beneficial. Discussed with patient being active is probably the best possible way for patient to decrease her back pain. Patient is taking 400 mg a gabapentin at night. Patient continues to have pain I would consider 100 mg dosing during the days. I would like to avoid any anti-inflammatories secondary to anticoagulation. Patient come back in 3-4 weeks for further evaluation.

## 2014-02-23 NOTE — Assessment & Plan Note (Signed)
Patient was given a knee brace, we discussed topical anti-inflammatories, we discussed icing protocol, and patient was given a handout of exercises. Patient declined formal physical therapy. Patient tried this for 3 weeks. If no significant improvement we'll consider an ultrasound-guided injection. X-rays of knees ordered to evaluate for any underlying arthritis I can be contributing.

## 2014-02-23 NOTE — Progress Notes (Signed)
[name] [address] [place], [place] [address] Phone: [phone] Subjective:    I'm seeing this patient by the request  of:  Cathlean Cower, MD   CC: Back pain and right knee pain   [id number] [name] is a 68 y.o. female coming in with complaint of low back pain as well as right knee pain.   Guarding patient's low back pain she has been seen by neurosurgery. Patient has been told that she does have arthritis. Patient has had epidural steroid injections. Patient states that these do not seem to help. Patient has done 2 or maybe 3 rounds of physical therapy without any significant improvement. Last imaging that I have at this time is a CT of the lumbar spine in 2013 showing multifactorial spinal stenosis most notably at L4-L5. Patient also has moderate to severe disc space narrowing at L1-L2 and L2-L3 without nerve impingement. Patient discusses the pain as more of a dull aching sensation with symptoms sometimes going down both legs. Patient states that it is difficult to stand in one position for long amount of time. Patient states that sometimes difficult to find a position at rest. Denies any nighttime awakening. Rates the severity of pain of 9 out of 10. All he responds minimally to tramadol.  Patient is also having right knee pain. States that most of it seems to be on the medial aspect. States that he can have a popping sensation that can sometimes be painful. Patient is seen other providers for this previously. Patient has had to have aspirin her knee done greater than one year ago. Patient does not remember any true injury. States that it is difficult to walk up or downstairs without pain. States that there can be a grinding sensation. States it's worse after sitting a long amount of time and then getting up. Sometimes seems to be better with activity. Patient has not tried any home modalities other than tramadol. No nighttime awakenings. No  radiation down the leg other than what from her back she states.     Past medical history, social, surgical and family history all reviewed in electronic medical record.   Review of Systems: No headache, visual changes, nausea, vomiting, diarrhea, constipation, dizziness, abdominal pain, skin rash, fevers, chills, night sweats, weight loss, swollen lymph nodes, body aches, joint swelling, muscle aches, chest pain, shortness of breath, mood changes.   Objective Blood pressure 106/60, pulse 81, height 5\' 3"  (1.6 m), weight 212 lb (96.163 kg), SpO2 96 %.  General: No apparent distress alert and oriented x3 mood and affect normal, dressed appropriately. Overweight HEENT: Pupils equal, extraocular movements intact  Respiratory: Patient's speak in full sentences and does not appear short of breath  Cardiovascular: No lower extremity edema, non tender, no erythema  Skin: Warm dry intact with no signs of infection or rash on extremities or on axial skeleton.  Abdomen: Soft nontender  Neuro: Cranial nerves II through XII are intact, neurovascularly intact in all extremities with 2+ DTRs and 2+ pulses.  Lymph: No lymphadenopathy of posterior or anterior cervical chain or axillae bilaterally.  Gait normal with good balance and coordination.  MSK:  Non tender with full range of motion and good stability and symmetric strength and tone of shoulders, elbows, wrist, hip, and ankles bilaterally.  Knee: Right Normal to inspection with no erythema or effusion or obvious bony abnormalities. Tender to palpation over the pes anserine area. Mild pain over the medial joint line ROM  full in flexion and extension and lower leg rotation. Ligaments with solid consistent endpoints including ACL, PCL, LCL, MCL. Equivocal Mcmurray's, Apley's, and Thessalonian tests. Mild painful patellar compression Moderate crepitus noted on range of motion Patellar and quadriceps tendons unremarkable. Hamstring and quadriceps  strength is normal.  Contralateral knee minimally tender over the medial joint line and has crepitus as well.  Back Exam:  Inspection: Unremarkable  Motion: Flexion 30 deg, Extension 35 deg, Side Bending to 35 deg bilaterally,  Rotation to 35 deg bilaterally  SLR laying: Negative  XSLR laying: Negative  Palpable tenderness: None. FABER: Positive right Sensory change: Gross sensation intact to all lumbar and sacral dermatomes.  Reflexes: 2+ at both patellar tendons, 2+ at achilles tendons, Babinski's downgoing.  Strength at foot  Plantar-flexion: 5/5 Dorsi-flexion: 5/5 Eversion: 5/5 Inversion: 5/5  Leg strength  Quad: 5/5 Hamstring: 5/5 Hip flexor: 5/5 Hip abductors: 3+/5  Gait unremarkable.      Impression and Recommendations:     This case required medical decision making of moderate complexity.

## 2014-02-23 NOTE — Patient Instructions (Signed)
Very nice to meet you Ice 20 minutes 2 times daily. Usually after activity and before bed. Exercises 3 times a week. Alternate handouts Wear brace with a lot of walking Get xrays downstairs.  Take tylenol 650 mg three times a day is the best evidence based medicine we have for arthritis.  Glucosamine sulfate 1500mg  daily. Try pennsaid twice daily  Capsaicin topically up to four times a day may also help with pain. Cortisone injections are an option if these interventions do not seem to make a difference or need more relief.  If cortisone injections do not help, there are different types of shots that may help but they take longer to take effect.  We can discuss this at follow up.  It's important that you continue to stay active. Controlling your weight is important.  Consider physical therapy to strengthen muscles around the joint that hurts to take pressure off of the joint itself. Shoe inserts with good arch support may be helpful.  [employer] orth[facility] at [employer] [facility] could help.  Water aerobics and cycling with low resistance are the best two types of exercise for arthritis. Come back and see me in 3 weeks.

## 2014-02-26 ENCOUNTER — Encounter: Payer: Self-pay | Admitting: Internal Medicine

## 2014-03-01 ENCOUNTER — Ambulatory Visit (INDEPENDENT_AMBULATORY_CARE_PROVIDER_SITE_OTHER): Payer: Commercial Managed Care - HMO | Admitting: *Deleted

## 2014-03-01 ENCOUNTER — Encounter (HOSPITAL_COMMUNITY): Payer: Self-pay | Admitting: Cardiology

## 2014-03-01 ENCOUNTER — Other Ambulatory Visit: Payer: Self-pay | Admitting: Internal Medicine

## 2014-03-01 ENCOUNTER — Other Ambulatory Visit: Payer: Self-pay | Admitting: Cardiology

## 2014-03-01 DIAGNOSIS — Z5181 Encounter for therapeutic drug level monitoring: Secondary | ICD-10-CM

## 2014-03-01 DIAGNOSIS — Z7901 Long term (current) use of anticoagulants: Secondary | ICD-10-CM

## 2014-03-01 DIAGNOSIS — I4891 Unspecified atrial fibrillation: Secondary | ICD-10-CM

## 2014-03-01 LAB — POCT INR: INR: 2.9

## 2014-03-01 MED ORDER — GLUCOSE BLOOD VI STRP: ORAL_STRIP | Status: DC

## 2014-03-01 MED ORDER — ACCU-CHEK NANO SMARTVIEW W/DEVICE KIT: PACK | Status: DC

## 2014-03-01 MED ORDER — NITROGLYCERIN 0.4 MG SL SUBL: 0.4000 mg | SUBLINGUAL_TABLET | SUBLINGUAL | Status: DC | PRN

## 2014-03-01 NOTE — Telephone Encounter (Signed)
Routine refills to D

## 2014-03-04 ENCOUNTER — Other Ambulatory Visit (HOSPITAL_COMMUNITY): Payer: Self-pay | Admitting: Internal Medicine

## 2014-03-04 ENCOUNTER — Other Ambulatory Visit: Payer: Self-pay | Admitting: Internal Medicine

## 2014-03-05 MED ORDER — NIZATIDINE 150 MG PO CAPS: 150.0000 mg | ORAL_CAPSULE | Freq: Every day | ORAL | Status: DC

## 2014-03-06 ENCOUNTER — Other Ambulatory Visit (HOSPITAL_COMMUNITY): Payer: Self-pay | Admitting: Internal Medicine

## 2014-03-07 ENCOUNTER — Telehealth: Payer: Self-pay | Admitting: General Practice

## 2014-03-07 NOTE — Telephone Encounter (Signed)
New Message        Patient states that the heart monitor  is giving you problems

## 2014-03-07 NOTE — Telephone Encounter (Signed)
Called pt stated that her home monitor has had the orange lights on since 11 AM. She has tried to reset monitor w/ no luck on the lights turning off. Instructed pt to call tech service.

## 2014-03-08 ENCOUNTER — Telehealth: Payer: Self-pay | Admitting: Internal Medicine

## 2014-03-08 NOTE — Telephone Encounter (Signed)
PT HAS QUESTIONS RE HER" HOME DEFIBRILLATOR BOX" PLS CALL

## 2014-03-08 NOTE — Telephone Encounter (Signed)
Spoke w/ pt on 03-07-14

## 2014-03-12 ENCOUNTER — Other Ambulatory Visit (HOSPITAL_COMMUNITY): Payer: Self-pay | Admitting: Internal Medicine

## 2014-03-21 ENCOUNTER — Ambulatory Visit: Payer: Commercial Managed Care - HMO | Admitting: Family Medicine

## 2014-03-27 ENCOUNTER — Ambulatory Visit (INDEPENDENT_AMBULATORY_CARE_PROVIDER_SITE_OTHER): Payer: Commercial Managed Care - HMO

## 2014-03-27 DIAGNOSIS — Z5181 Encounter for therapeutic drug level monitoring: Secondary | ICD-10-CM

## 2014-03-27 DIAGNOSIS — I4891 Unspecified atrial fibrillation: Secondary | ICD-10-CM

## 2014-03-27 DIAGNOSIS — Z7901 Long term (current) use of anticoagulants: Secondary | ICD-10-CM

## 2014-03-27 LAB — POCT INR: INR: 2.5

## 2014-03-28 ENCOUNTER — Other Ambulatory Visit (INDEPENDENT_AMBULATORY_CARE_PROVIDER_SITE_OTHER): Payer: Commercial Managed Care - HMO

## 2014-03-28 ENCOUNTER — Ambulatory Visit (INDEPENDENT_AMBULATORY_CARE_PROVIDER_SITE_OTHER): Payer: Commercial Managed Care - HMO | Admitting: Family Medicine

## 2014-03-28 ENCOUNTER — Encounter: Payer: Self-pay | Admitting: Family Medicine

## 2014-03-28 VITALS — BP 126/78 | HR 80 | Wt 208.0 lb

## 2014-03-28 DIAGNOSIS — M7050 Other bursitis of knee, unspecified knee: Secondary | ICD-10-CM

## 2014-03-28 DIAGNOSIS — M25561 Pain in right knee: Secondary | ICD-10-CM

## 2014-03-28 DIAGNOSIS — M25562 Pain in left knee: Secondary | ICD-10-CM

## 2014-03-28 DIAGNOSIS — M48061 Spinal stenosis, lumbar region without neurogenic claudication: Secondary | ICD-10-CM

## 2014-03-28 DIAGNOSIS — M4806 Spinal stenosis, lumbar region: Secondary | ICD-10-CM

## 2014-03-28 DIAGNOSIS — S83419A Sprain of medial collateral ligament of unspecified knee, initial encounter: Secondary | ICD-10-CM | POA: Insufficient documentation

## 2014-03-28 DIAGNOSIS — M7150 Other bursitis, not elsewhere classified, unspecified site: Secondary | ICD-10-CM

## 2014-03-28 DIAGNOSIS — S83412A Sprain of medial collateral ligament of left knee, initial encounter: Secondary | ICD-10-CM

## 2014-03-28 HISTORY — DX: Sprain of medial collateral ligament of unspecified knee, initial encounter: S83.419A

## 2014-03-28 NOTE — Patient Instructions (Signed)
Good to see you [name] is your friend.  Alternate handouts of exercises daily.  Continue to wear good shoes.  Continue the gabapentin.  I would like to see you again in 4 weeks to make sure you are better.

## 2014-03-28 NOTE — Progress Notes (Signed)
[id number]; 15 additional minutes spent for Therapeutic exercises as stated in above notes.  This included exercises focusing on stretching, strengthening, with significant focus on eccentric aspects.   Proper technique shown and discussed handout in great detail with ATC.  All questions were discussed and answered.

## 2014-03-28 NOTE — Progress Notes (Signed)
[facility] [address] [place], [place] [address] Phone: [phone] Subjective:     CC: Back pain and right knee pain follow up  KGY:[id number] [name] is a 69 y.o. female coming in with complaint of low back pain as well as right knee pain.   Patient's low back pain she has been seen by neurosurgery. Patient has been told that she does have arthritis. Patient has had epidural steroid injections. Patient states that these do not seem to help. Patient has done 2 or maybe 3 rounds of physical therapy without any significant improvement. Last imaging that I have at this time is a CT of the lumbar spine in 2013 showing multifactorial spinal stenosis most notably at L4-L5. Patient also has moderate to severe disc space narrowing at L1-L2 and L2-L3 without nerve impingement. Patient states this is not made any significant improvement. Patient though states that it is tolerable overall.  Patient is also having right knee pain. Patient was seen previously and had more of a pes anserine bursitis. Patient has been doing exercises but has not been icing. Patient has not taken any medication specific for this. Patient rates the severity of pain is 8 out of 10 in worsening overall. Patient states he can feels tight that stops her from certain activities.  Patient is also complaining of contralateral knee pain. Patient states is on the medial joint line. Patient has had an MCL injury previously. Patient has not been doing anything specific for this. Topical anti-inflammatories are not helpful. Denies any instability, locking, or giving out on her.     Past medical history, social, surgical and family history all reviewed in electronic medical record.   Review of Systems: No headache, visual changes, nausea, vomiting, diarrhea, constipation, dizziness, abdominal pain, skin rash, fevers, chills, night sweats, weight loss, swollen lymph nodes, body aches, joint  swelling, muscle aches, chest pain, shortness of breath, mood changes.   Objective Blood pressure 126/78, pulse 80, weight 208 lb (94.348 kg), SpO2 96 %.  General: No apparent distress alert and oriented x3 mood and affect normal, dressed appropriately. Overweight HEENT: Pupils equal, extraocular movements intact  Respiratory: Patient's speak in full sentences and does not appear short of breath  Cardiovascular: No lower extremity edema, non tender, no erythema  Skin: Warm dry intact with no signs of infection or rash on extremities or on axial skeleton.  Abdomen: Soft nontender  Neuro: Cranial nerves II through XII are intact, neurovascularly intact in all extremities with 2+ DTRs and 2+ pulses.  Lymph: No lymphadenopathy of posterior or anterior cervical chain or axillae bilaterally.  Gait normal with good balance and coordination.  MSK:  Non tender with full range of motion and good stability and symmetric strength and tone of shoulders, elbows, wrist, hip, and ankles bilaterally.  Knee: Right Normal to inspection with no erythema or effusion or obvious bony abnormalities. Tender to palpation over the pes anserine area. Mild pain over the medial joint line ROM full in flexion and extension and lower leg rotation. Ligaments with solid consistent endpoints including ACL, PCL, LCL, MCL. Equivocal Mcmurray's, Apley's, and Thessalonian tests. Mild painful patellar compression Moderate crepitus noted on range of motion Patellar and quadriceps tendons unremarkable. Hamstring and quadriceps strength is normal.  Contralateral knee minimally tender over the medial joint line as well as pain with stressing the MCL.  Back Exam:  Inspection: Unremarkable  Motion: Flexion 30 deg, Extension 35 deg, Side Bending to 35 deg  bilaterally,  Rotation to 35 deg bilaterally  SLR laying: Negative  XSLR laying: Negative  Palpable tenderness: None. FABER: Positive right Sensory change: Gross sensation  intact to all lumbar and sacral dermatomes.  Reflexes: 2+ at both patellar tendons, 2+ at achilles tendons, Babinski's downgoing.  Strength at foot  Plantar-flexion: 5/5 Dorsi-flexion: 5/5 Eversion: 5/5 Inversion: 5/5  Leg strength  Quad: 5/5 Hamstring: 5/5 Hip flexor: 5/5 Hip abductors: 3+/5  Gait unremarkable. No significant change from previous exam  Procedure: Real-time Ultrasound Guided Injection of has anserine bursa Device: GE Logiq E  Ultrasound guided injection is preferred based studies that show increased duration, increased effect, greater accuracy, decreased procedural pain, increased response rate, and decreased cost with ultrasound guided versus blind injection.  Verbal informed consent obtained.  Time-out conducted.  Noted no overlying erythema, induration, or other signs of local infection.  Skin prepped in a sterile fashion.  Local anesthesia: Topical Ethyl chloride.  With sterile technique and under real time ultrasound guidance:  With a 0.1-gauge 2 inch needle patient was injected with 3 mL of 0.5% Marcaine and 1 mL excellent 40 mg/dL into the pes anserine bursa. Completed without difficulty  Pain immediately resolved suggesting accurate placement of the medication.  Advised to call if fevers/chills, erythema, induration, drainage, or persistent bleeding.  Images permanently stored and available for review in the ultrasound unit.  Impression: Technically successful ultrasound guided injection.    Impression and Recommendations:     This case required medical decision making of moderate complexity.

## 2014-03-28 NOTE — Assessment & Plan Note (Signed)
MCL, injury, discussed bracing, icing, discussed topical.  Patient was showed proper technique today. We discussed home exercises in great detail. I do not feel any instability of the knee and patient does have a positive endpoint. Patient likely will heal with conservative therapy. Patient come back in 4 weeks for further evaluation and treatment.

## 2014-03-28 NOTE — Assessment & Plan Note (Signed)
Ultrasound guided injection done today. We discussed icing regimen, compression sleeve to change the fulcrum of the hamstring. Patient took additional time other than the evaluation today with athletic trainer to learn specific exercises. Patient encouraged to wear good shoes. Differential also includes lumbar radiculopathy causing some tightness of the hamstrings. If this continues we may need to consider further workup of the lower back. Patient has had significant intervention on the lower back previously. Patient is on gabapentin 400 mg twice daily. Would consider adding a low dose nortriptyline because Lyrica is too expensive.

## 2014-03-28 NOTE — Assessment & Plan Note (Signed)
Chronic problem but will likely continue. Encourage patient to monitor her weight as well as exercises. We will make no significant changes at this time. Due to the radicular symptoms patient may need to have a change of the gabapentin. Has had difficulty getting Lyrica secondary to the price. Questionable change to Cymbalta could be beneficial. We will discuss further at follow-up.

## 2014-03-31 NOTE — Addendum Note (Signed)
Addended by: Biagio Borg on: 03/31/2014 09:20 PM   Modules accepted: Miquel Dunn

## 2014-04-04 ENCOUNTER — Ambulatory Visit (INDEPENDENT_AMBULATORY_CARE_PROVIDER_SITE_OTHER): Payer: Commercial Managed Care - HMO | Admitting: Internal Medicine

## 2014-04-04 ENCOUNTER — Ambulatory Visit: Payer: Commercial Managed Care - HMO | Admitting: Family Medicine

## 2014-04-04 ENCOUNTER — Encounter: Payer: Self-pay | Admitting: Internal Medicine

## 2014-04-04 VITALS — BP 92/64 | HR 70 | Temp 98.0°F | Ht 63.0 in | Wt 206.0 lb

## 2014-04-04 DIAGNOSIS — E1165 Type 2 diabetes mellitus with hyperglycemia: Secondary | ICD-10-CM

## 2014-04-04 DIAGNOSIS — R062 Wheezing: Secondary | ICD-10-CM

## 2014-04-04 DIAGNOSIS — E1159 Type 2 diabetes mellitus with other circulatory complications: Secondary | ICD-10-CM

## 2014-04-04 DIAGNOSIS — E1151 Type 2 diabetes mellitus with diabetic peripheral angiopathy without gangrene: Secondary | ICD-10-CM

## 2014-04-04 DIAGNOSIS — IMO0002 Reserved for concepts with insufficient information to code with codable children: Secondary | ICD-10-CM

## 2014-04-04 DIAGNOSIS — J069 Acute upper respiratory infection, unspecified: Secondary | ICD-10-CM

## 2014-04-04 MED ORDER — HYDROCODONE-HOMATROPINE 5-1.5 MG/5ML PO SYRP: 5.0000 mL | ORAL_SOLUTION | Freq: Four times a day (QID) | ORAL | Status: DC | PRN

## 2014-04-04 MED ORDER — LEVOFLOXACIN 250 MG PO TABS: 250.0000 mg | ORAL_TABLET | Freq: Every day | ORAL | Status: DC

## 2014-04-04 MED ORDER — METHYLPREDNISOLONE ACETATE 80 MG/ML IJ SUSP
80.0000 mg | Freq: Once | INTRAMUSCULAR | Status: AC
  Administered 2014-04-04: 80 mg via INTRAMUSCULAR

## 2014-04-04 MED ORDER — PREDNISONE 10 MG PO TABS: ORAL_TABLET | ORAL | Status: DC

## 2014-04-04 NOTE — Patient Instructions (Addendum)
You had the steroid shot today  Please take all new medication as prescribed - the antibiotic, cough medicine, and prednisone  Please continue all other medications as before, and refills have been done if requested.  Please have the pharmacy call with any other refills you may need.  Please keep your appointments with your specialists as you may have planned  Please return in 6 months, or sooner if needed

## 2014-04-04 NOTE — Progress Notes (Signed)
Pre visit review using our clinic review tool, if applicable. No additional management support is needed unless otherwise documented below in the visit note. 

## 2014-04-04 NOTE — Progress Notes (Signed)
Subjective:    Patient ID: [name], female    DOB: [birth year], 69 y.o.   MRN: [id number]  HPI   Here with 2-3 days acute onset fever, facial pain, pressure, headache, general weakness and malaise, and greenish d/c, with mild ST and cough, but pt denies chest pain, wheezing, increased sob or doe, orthopnea, PND, increased LE swelling, palpitations, dizziness or syncope, except for onset mild wheezing last PM, with sob  Pt denies polydipsia, polyuria, or low sugar symptoms such as weakness or confusion improved with po intake.  Pt states overall good compliance with meds, trying to follow lower cholesterol, diabetic diet, wt overall stable but little exercise however.     Past Medical History  Diagnosis Date  . Chronic systolic heart failure   . Diabetes mellitus     type 1  . Hypertension   . Stroke 2010    eye doctor said she had TIA  . Ischemic cardiomyopathy   . Ventricular tachycardia     Polymorphic  . 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   . Atrial fibrillation     controlled with amiodarone, on coumadin  . Coronary artery disease 01/31/2011  . Chronic renal insufficiency   . Lumbar spondylosis 01/11/2012  . History of chicken pox   . Allergy   . Hyperlipidemia   . Arthritis   . Congestive heart failure    Past Surgical History  Procedure Laterality Date  . Abdominal hysterectomy  1995  . Cholecystectomy  1985  . Tubaligation  1980  . Icd  2003/2007    implanted by Dr Rollene Fare, most recent generator change 2/13 by Dr Rayann Heman, Analyze ST study patient  . Cardiac defibrillator placement    . Left heart catheterization with coronary angiogram N/A 01/30/2011    Procedure: LEFT HEART CATHETERIZATION WITH CORONARY ANGIOGRAM;  Surgeon: Larey Dresser, MD;  Location: [facility] CATH LAB;  Service: Cardiovascular;  Laterality: N/A;  . Implantable cardioverter defibrillator (icd) generator change N/A 05/05/2011    Procedure: ICD GENERATOR CHANGE;  Surgeon: Thompson Grayer, MD;  Location: [facility] CATH LAB;  Service: Cardiovascular;  Laterality: N/A;    reports that she quit smoking about 18 years ago. She has never used smokeless tobacco. She reports that she does not drink alcohol or use illicit drugs. family history includes Breast cancer in her sister; Colon cancer in her maternal aunt; Diabetes in her mother; Early death (age of onset: 9) in her brother; Heart attack in her father; Heart disease in her father and mother; Hyperlipidemia in her mother; Hypertension in her father and mother; Irritable bowel syndrome in her sister; Lung cancer in her sister. There is no history of Esophageal cancer or Colon polyps. Allergies  Allergen Reactions  . Darvon Other (See Comments)    indigestion  . Promethazine Hcl Other (See Comments)    hyperactivity  . Plavix [Clopidogrel Bisulfate] Rash   Current Outpatient Prescriptions on File Prior to Visit  Medication Sig Dispense Refill  . amiodarone (PACERONE) 200 MG tablet TAKE 1 TABLET (200 MG TOTAL) BY MOUTH DAILY. 60 tablet 2  . Blood Glucose Monitoring Suppl (ACCU-CHEK NANO SMARTVIEW) W/DEVICE KIT Use to test blood sugar 4 times daily as instructed. Dx code: E11.59 1 kit 0  . carvedilol (COREG) 25 MG tablet TAKE 1 TABLET BY MOUTH TWICE A DAY WITH A MEAL 60 tablet 3  . cetirizine (ZYRTEC) 10 MG tablet Take 10 mg by mouth daily.    . Cholecalciferol (VITAMIN D3) 2000  UNITS TABS Take 2,000 Units by mouth daily.     . CRESTOR 20 MG tablet TAKE 1 TABLET BY MOUTH EVERY DAY 30 tablet 1  . CRESTOR 20 MG tablet TAKE 1 TABLET BY MOUTH EVERY DAY 30 tablet 1  . diclofenac sodium (VOLTAREN) 1 % GEL Apply 2 g topically 4 (four) times daily.    . furosemide (LASIX) 20 MG tablet Take 1 tablet (20 mg total) by mouth daily. 30 tablet 11  . gabapentin (NEURONTIN) 400 MG capsule Take 1 capsule (400 mg total) by mouth 2 (two) times daily. (Patient taking differently: Take 400 mg by mouth 2 (two) times daily after a meal. ) 180 capsule 3    . glucose blood (ACCU-CHEK SMARTVIEW) test strip Use to test blood sugar 4 times daily as instructed. Dx code E11.59 125 each 3  . glycopyrrolate (ROBINUL) 1 MG tablet Take 1 tablet (1 mg total) by mouth 2 (two) times daily. 60 tablet 6  . Insulin Detemir (LEVEMIR FLEXTOUCH) 100 UNIT/ML Pen Inject under skin 40 units in am and 50 units at bedtime 45 mL 1  . insulin lispro (HUMALOG) 100 UNIT/ML injection Inject 0.12-0.3 mLs (12-30 Units total) into the skin 3 (three) times daily before meals. 20 mL 11  . lisinopril (PRINIVIL,ZESTRIL) 5 MG tablet TAKE 1 TABLET BY MOUTH DAILY 30 tablet 6  . metFORMIN (GLUCOPHAGE) 500 MG tablet Take 2 tablets (1,000 mg total) by mouth 2 (two) times daily with a meal. 120 tablet 3  . Multiple Vitamins-Minerals (EYE VITAMINS PO) Take 1 tablet by mouth daily.     . nitroGLYCERIN (NITROSTAT) 0.4 MG SL tablet Place 1 tablet (0.4 mg total) under the tongue every 5 (five) minutes as needed for chest pain. 25 tablet 1  . nizatidine (AXID) 150 MG capsule Take 1 capsule (150 mg total) by mouth daily. 90 capsule 3  . omeprazole (PRILOSEC) 20 MG capsule TAKE 1 CAPSULE (20 MG TOTAL) BY MOUTH DAILY. 30 capsule 5  . rosuvastatin (CRESTOR) 20 MG tablet Take 20 mg by mouth daily.    . traMADol (ULTRAM) 50 MG tablet Take 1 tablet (50 mg total) by mouth every 6 (six) hours as needed. 120 tablet 1  . Vitamin D, Ergocalciferol, (DRISDOL) 50000 UNITS CAPS capsule Take 50,000 Units by mouth every 7 (seven) days. monday    . warfarin (COUMADIN) 1 MG tablet Take 1 tablet (1 mg total) by mouth daily. (Patient taking differently: Take 1 mg by mouth daily. Pt currently taking 2.5 mg, but is being reduced down to 1 mg once pt finish medicine currently on.) 40 tablet 3   No current facility-administered medications on file prior to visit.   Review of Systems  Constitutional: Negative for unusual diaphoresis or other sweats  HENT: Negative for ringing in ear Eyes: Negative for double vision or  worsening visual disturbance.  Respiratory: Negative for choking and stridor.   Gastrointestinal: Negative for vomiting or other signifcant bowel change Genitourinary: Negative for hematuria or decreased urine volume.  Musculoskeletal: Negative for other MSK pain or swelling Skin: Negative for color change and worsening wound.  Neurological: Negative for tremors and numbness other than noted  Psychiatric/Behavioral: Negative for decreased concentration or agitation other than above       Objective:   Physical Exam BP 92/64 mmHg  Pulse 70  Temp(Src) 98 F (36.7 C) (Oral)  Ht _0  (1.6 m)  Wt 206 lb (93.441 kg)  BMI 36.50 kg/m2 VS noted, mild ill Constitutional: Pt  appears well-developed, well-nourished.  HENT: Head: NCAT.  Right Ear: External ear normal.  Left Ear: External ear normal.  Eyes: . Pupils are equal, round, and reactive to light. Conjunctivae and EOM are normal Bilat tm's with mild erythema.  Max sinus areas mild tender.  Pharynx with mild erythema, no exudate Neck: Normal range of motion. Neck supple.  Cardiovascular: Normal rate and regular rhythm.   Pulmonary/Chest: Effort normal without rales, but with bilat few wheezes, decreased BS Neurological: Pt is alert. Not confused , motor grossly intact Skin: Skin is warm. No rash Psychiatric: Pt behavior is normal. No agitation.     Assessment & Plan:

## 2014-04-05 ENCOUNTER — Telehealth: Payer: Self-pay

## 2014-04-05 NOTE — Telephone Encounter (Signed)
Clarification ok to take levofloxacin and amiodarone ok to take together.  Call back to pharmacist Crystal at [phone]

## 2014-04-05 NOTE — Telephone Encounter (Signed)
Pharmacist informed ok.

## 2014-04-05 NOTE — Telephone Encounter (Signed)
Ok this time, thanks

## 2014-04-08 NOTE — Assessment & Plan Note (Signed)
Mild to mod, for depomedrol IM, predpac asd, to f/u any worsening symptoms or concerns 

## 2014-04-08 NOTE — Assessment & Plan Note (Signed)
stable overall by history and exam, recent data reviewed with pt, and pt to continue medical treatment as before,  to f/u any worsening symptoms or concerns Lab Results  Component Value Date   HGBA1C 8.1* 01/16/2014   Pt to call for onset polys or cbg > 200

## 2014-04-08 NOTE — Assessment & Plan Note (Signed)
Mild to mod, for antibx course,  to f/u any worsening symptoms or concerns 

## 2014-04-19 ENCOUNTER — Ambulatory Visit (INDEPENDENT_AMBULATORY_CARE_PROVIDER_SITE_OTHER): Payer: Commercial Managed Care - HMO | Admitting: Pharmacist Clinician (PhC)/ Clinical Pharmacy Specialist

## 2014-04-19 ENCOUNTER — Ambulatory Visit (INDEPENDENT_AMBULATORY_CARE_PROVIDER_SITE_OTHER): Payer: Commercial Managed Care - HMO | Admitting: Internal Medicine

## 2014-04-19 ENCOUNTER — Encounter: Payer: Self-pay | Admitting: Internal Medicine

## 2014-04-19 ENCOUNTER — Other Ambulatory Visit: Payer: Self-pay | Admitting: *Deleted

## 2014-04-19 VITALS — BP 112/68 | HR 77 | Temp 97.6°F | Resp 12 | Wt 208.8 lb

## 2014-04-19 DIAGNOSIS — Z5181 Encounter for therapeutic drug level monitoring: Secondary | ICD-10-CM

## 2014-04-19 DIAGNOSIS — E1151 Type 2 diabetes mellitus with diabetic peripheral angiopathy without gangrene: Secondary | ICD-10-CM

## 2014-04-19 DIAGNOSIS — IMO0002 Reserved for concepts with insufficient information to code with codable children: Secondary | ICD-10-CM

## 2014-04-19 DIAGNOSIS — I4891 Unspecified atrial fibrillation: Secondary | ICD-10-CM

## 2014-04-19 DIAGNOSIS — E1165 Type 2 diabetes mellitus with hyperglycemia: Principal | ICD-10-CM

## 2014-04-19 DIAGNOSIS — Z7901 Long term (current) use of anticoagulants: Secondary | ICD-10-CM

## 2014-04-19 DIAGNOSIS — E1159 Type 2 diabetes mellitus with other circulatory complications: Secondary | ICD-10-CM

## 2014-04-19 LAB — HEMOGLOBIN A1C: HEMOGLOBIN A1C: 8.2 % — AB (ref 4.6–6.5)

## 2014-04-19 LAB — POCT INR: INR: 2

## 2014-04-19 MED ORDER — GLUCOSE BLOOD VI STRP: ORAL_STRIP | Status: DC

## 2014-04-19 MED ORDER — METFORMIN HCL ER 500 MG PO TB24: 500.0000 mg | ORAL_TABLET | Freq: Every day | ORAL | Status: DC

## 2014-04-19 MED ORDER — INSULIN DETEMIR 100 UNIT/ML FLEXPEN: PEN_INJECTOR | SUBCUTANEOUS | Status: DC

## 2014-04-19 NOTE — Progress Notes (Signed)
Patient ID: [name], female   DOB: 05-11-1945, 69 y.o.   MRN: [id number]   HPI: [name] is a 69 y.o.-year-old female, returning for f/u for DM2, insulin-dependent, uncontrolled, with complications (CAD, ICM - had ICD, peripheral neuropathy, gastroparesis). Last visit 3 mo ago.  Last hemoglobin A1c was: Lab Results  Component Value Date   HGBA1C 8.1* 01/16/2014   HGBA1C 7.8* 10/16/2013   HGBA1C 8.2* 04/03/2013  07/03/2013: 7.2% - through her insurance   Pt is on a regimen of: Levemir 40 units in am and 50 units at bedtime.  Mealtime Humalog:  10-15 units for a smaller meal  18 units for a medium meal  23 units before a large meal or dinner - but highest she used was 20 - rarely Humalog sliding scale:  - 150- 165: + 2 unit  - 166- 180: + 3 units  - 181- 195: + 4 units  - 196- 210: + 5 units  - >210: + 6 units  Metformin 1000 mg bid - started 12/2013 - could not tolerate it 2/2 imbalance >> now 500 mg with dinner  Pt checks her sugars 3-4 a day and they are better, but fluctuating- brings a good log: - am: 104-189 in the last 2 weeks, before 132-236 >> 148-246, 260 >> 125-219, 246 (higher values after cough syrup at night) - before lunch: 57 x1, 66-160, spikes at 200 >> 58x1, 75,111-183 >> 51-140, 180 - after lunch: 55-171 >> n/c >> 100 >> n/c >> n/c - before dinner:  70, 86, 99-224 >> 74-212, 1x 265 >> 94-201 >> 78-293 (higher if the meal before in the 50's >> does not take insulin with that meal) - bedtime: 85-265 >> 79-200, 220 x 1, 237, 307 x1 >> 60, 101-188, 241 >> 74-180, some 200s Lowest sugar was 44  she has hypoglycemia awareness at 70. Highest sugar was 297.  Pt's meals are: - Breakfast: small bowl of cereal frosted with 2% milk - Lunch (5-14 units): vegetable + starch; turkey/ham sandwich - Dinner (20 units): fast food or cooks: meat + vegetables + sometimes potatoes - Snacks: fruit (apple) Did not see nutrition yet >> did not have  transportation She has a glass of 2% milk before bedtime. Tried Almond milk but did not like it as it was too sweet.  - Has CKD, last BUN/creatinine:  Lab Results  Component Value Date   BUN 34* 12/05/2013   CREATININE 1.24* 12/05/2013  ACR in 12/15/2012: 1.0.  - last set of lipids: Lab Results  Component Value Date   CHOL 152 12/05/2013   HDL 50 12/05/2013   LDLCALC 81 12/05/2013   TRIG 107 12/05/2013   CHOLHDL 3.0 12/05/2013  she is on Crestor. - last eye exam was in 11/2013. No DR.  - + numbness and tingling in her feet - on neurontin.  I reviewed pt's medications, allergies, PMH, social hx, family hx, and changes were documented in the history of present illness. Otherwise, unchanged from my initial visit note.  ROS: Constitutional: + weight gain, no fatigue, no subjective hyperthermia/hypothermia, + poor sleep  Eyes: no blurry vision, no xerophthalmia ENT: no sore throat, no nodules palpated in throat, no dysphagia/odynophagia, + hoarseness, + tinnitus Cardiovascular: no CP/no SOB/no palpitations/leg swelling Respiratory: + cough/no SOB Gastrointestinal: no N/V/D/+C Musculoskeletal: no muscle/joint aches and swelling Skin: no rashes, + excessive hair growth  PE: BP 112/68 mmHg  Pulse 77  Temp(Src) 97.6 F (36.4 C) (Oral)  Resp 12  Wt 208 lb 12.8 oz (94.711 kg)  SpO2 97% Body mass index is 37 kg/(m^2).  Wt Readings from Last 3 Encounters:  04/19/14 208 lb 12.8 oz (94.711 kg)  04/04/14 206 lb (93.441 kg)  03/28/14 208 lb (94.348 kg)   Constitutional: overweight, in NAD, full supraclavicular fat pads Eyes: PERRLA, EOMI, no exophthalmos ENT: moist mucous membranes, no thyromegaly, no cervical lymphadenopathy Cardiovascular: RRR, No MRG, + pitting LE edema Respiratory: CTA B Gastrointestinal: abdomen soft, NT, ND, BS+ Musculoskeletal: no deformities, strength intact in all 4 Skin: moist, warm, no rashes  ASSESSMENT: 1. DM2, insulin-dependent, uncontrolled,  with complications - CAD, ICM - s/p ICD placement - CKD - PN - on neurontin - gastroparesis per [facility] 06/18/2012 >> 60 minutes: 92%, 120 minutes: 82%  PLAN:  1. Patient with long-standing, uncontrolled diabetes, on basal bolus regimen, with still high sugars: - We will switch to metformin extended-release as she cannot tolerate a regular metformin. Will try to increase this to the maximum tolerated dose. She has lows throughout the day, therefore I will decrease her Levemir to 25 units in the morning. With this change, she can take higher doses of Humalog without the risk of lows. She is now not using the 18 or 20 units much, but I believe that she needs them.  Patient Instructions  Please decrease the Levemir as follows: - Levemir 25 units in am and 50 units at bedtime.  - please use the following Mealtime Humalog doses:  15 units for a smaller meal  (or breakfast) 18 units for a medium meal  20 units before a large meal (or dinner) If sugars are <60 before a meal, please take at least half of the dose planned. - Humalog sliding scale:  - 150- 165: + 2 unit  - 166- 180: + 3 units  - 181- 195: + 4 units  - 196- 210: + 5 units  - >210: + 6 units  - switch to Metformin XR and try to increase the dose to 1000 mg 2x a day. Stay on the maximum tolerated dose.  Please stop at the lab.  Please return in 1.5 month with your sugar log.   - continue checking her sugars at different times of the day - check 4 times a day - up to date with eye exams  - given new sugar logs - will check a HbA1c today - We gave her the flu vaccine at last visit Return in about 6 weeks (around 05/31/2014).  Component Date Value Ref Range Status  . Hgb A1c MFr Bld 04/19/2014 8.2* 4.6 - 6.5 % Final   Glycemic Control Guidelines for People with Diabetes:Non Diabetic:  <6%Goal of Therapy: <7%Additional Action Suggested:  >8%    HbA1c stable.

## 2014-04-19 NOTE — Patient Instructions (Addendum)
Please decrease the Levemir as follows: - Levemir 25 units in am and 50 units at bedtime.  - please use the following Mealtime Humalog doses:  15 units for a smaller meal  (or breakfast) 18 units for a medium meal  20 units before a large meal (or dinner) If sugars are <60 before a meal, please take at least half of the dose planned. - Humalog sliding scale:  - 150- 165: + 2 unit  - 166- 180: + 3 units  - 181- 195: + 4 units  - 196- 210: + 5 units  - >210: + 6 units  - switch to Metformin XR and try to increase the dose to 1000 mg 2x a day. Stay on the maximum tolerated dose.  Please stop at the lab.  Please return in 1.5 month with your sugar log.

## 2014-04-29 ENCOUNTER — Other Ambulatory Visit: Payer: Self-pay | Admitting: Internal Medicine

## 2014-04-30 ENCOUNTER — Ambulatory Visit (INDEPENDENT_AMBULATORY_CARE_PROVIDER_SITE_OTHER): Payer: Commercial Managed Care - HMO | Admitting: Family Medicine

## 2014-04-30 ENCOUNTER — Telehealth: Payer: Self-pay | Admitting: Internal Medicine

## 2014-04-30 ENCOUNTER — Encounter: Payer: Self-pay | Admitting: Family Medicine

## 2014-04-30 VITALS — BP 122/70 | HR 88 | Ht 63.0 in | Wt 207.0 lb

## 2014-04-30 DIAGNOSIS — M4806 Spinal stenosis, lumbar region: Secondary | ICD-10-CM

## 2014-04-30 DIAGNOSIS — M48061 Spinal stenosis, lumbar region without neurogenic claudication: Secondary | ICD-10-CM

## 2014-04-30 DIAGNOSIS — M129 Arthropathy, unspecified: Secondary | ICD-10-CM

## 2014-04-30 DIAGNOSIS — M7050 Other bursitis of knee, unspecified knee: Secondary | ICD-10-CM

## 2014-04-30 DIAGNOSIS — M7150 Other bursitis, not elsewhere classified, unspecified site: Secondary | ICD-10-CM

## 2014-04-30 DIAGNOSIS — M1712 Unilateral primary osteoarthritis, left knee: Secondary | ICD-10-CM | POA: Insufficient documentation

## 2014-04-30 MED ORDER — VENLAFAXINE HCL 37.5 MG PO TABS: 37.5000 mg | ORAL_TABLET | Freq: Two times a day (BID) | ORAL | Status: DC

## 2014-04-30 NOTE — Telephone Encounter (Signed)
Patient seen cardiology on 12/10.  States Dr. Jenny Reichmann was to send a referral but states there was not one sent.  Can you please call patient in regards.

## 2014-04-30 NOTE — Patient Instructions (Addendum)
Good to see you [name] is your friend.  Try the new brace on the left knee.  Try the Effexor daily Call if any side effects.  See me again in 3 weeks.

## 2014-04-30 NOTE — Assessment & Plan Note (Signed)
Discussing it with patient in great length. We discussed icing regimen at home exercises. We discussed avoiding any significant increase in activity. I do think that there is a possibility for patient having some mild chronic pain. Patient is taking gabapentin at a low dose and states that this is helpful but does make her sleepy. Patient is to be started on a very low dose of Effexor but we will not titrate up. Patient will stay on the lowest dose. But hopefully that this will be beneficial. Patient and will come back and see me again in 3 weeks to make sure she is improving.

## 2014-04-30 NOTE — Progress Notes (Signed)
[facility] [place], [place] [address] Phone: [phone] Subjective:     CC: Back pain and right knee pain follow up  [id number] [name] is a 69 y.o. female coming in with complaint of low back pain as well as right knee pain.   Patient's low back pain she has been seen by neurosurgery. Patient has been told that she does have arthritis. Patient has had epidural steroid injections. Patient states that these do not seem to help. Patient has done 2 or maybe 3 rounds of physical therapy without any significant improvement. Last imaging that I have at this time is a CT of the lumbar spine in 2013 showing multifactorial spinal stenosis most notably at L4-L5. Patient also has moderate to severe disc space narrowing at L1-L2 and L2-L3 without nerve impingement.  Patient continues to have some discomfort. Patient states that the weakness in her legs seems to be doing somewhat better. Patient has been doing some of the exercises that giving her previously but has not been doing them regularly because it causes more pain..   Patient is also having right knee pain. Patient was seen previously and had more of a pes anserine bursitis. Had an injection at last follow-up that is so much better.  Patient is also complaining of contralateral knee pain. Patient states is on the medial joint line. Patient has had an MCL injury previously. Patient does have moderate to severe osteophytic changes of the medial joint line on this side.     Past medical history, social, surgical and family history all reviewed in electronic medical record.   Review of Systems: No headache, visual changes, nausea, vomiting, diarrhea, constipation, dizziness, abdominal pain, skin rash, fevers, chills, night sweats, weight loss, swollen lymph nodes, body aches, joint swelling, muscle aches, chest pain, shortness of breath, mood changes.   Objective Blood pressure 122/70, pulse  88, height 5\' 3"  (1.6 m), weight 207 lb (93.895 kg), SpO2 95 %.  General: No apparent distress alert and oriented x3 mood and affect normal, dressed appropriately. Overweight HEENT: Pupils equal, extraocular movements intact  Respiratory: Patient's speak in full sentences and does not appear short of breath  Cardiovascular: No lower extremity edema, non tender, no erythema  Skin: Warm dry intact with no signs of infection or rash on extremities or on axial skeleton.  Abdomen: Soft nontender  Neuro: Cranial nerves II through XII are intact, neurovascularly intact in all extremities with 2+ DTRs and 2+ pulses.  Lymph: No lymphadenopathy of posterior or anterior cervical chain or axillae bilaterally.  Gait normal with good balance and coordination.  MSK:  Non tender with full range of motion and good stability and symmetric strength and tone of shoulders, elbows, wrist, hip, and ankles bilaterally.  Knee: Right Normal to inspection with no erythema or effusion or obvious bony abnormalities. Minimal tenderness over the pes anserine area. This is significantly improved. ROM full in flexion and extension and lower leg rotation. Ligaments with solid consistent endpoints including ACL, PCL, LCL, MCL. Negative Mcmurray's, Apley's, and Thessalonian tests. Mild painful patellar compression Moderate crepitus noted on range of motion Patellar and quadriceps tendons unremarkable. Hamstring and quadriceps strength is normal.   Contralateral knee has severe tenderness over the medial joint line. Mild crepitus with range of motion.  Back Exam:  Inspection: Unremarkable  Motion: Flexion 30 deg, Extension 35 deg, Side Bending to 35 deg bilaterally,  Rotation to 35 deg bilaterally  SLR laying: Negative  XSLR laying: Negative  Palpable tenderness: None. FABER: Positive right Sensory change: Gross sensation intact to all lumbar and sacral dermatomes.  Reflexes: 2+ at both patellar tendons, 2+ at achilles  tendons, Babinski's downgoing.  Strength at foot  Plantar-flexion: 5/5 Dorsi-flexion: 5/5 Eversion: 5/5 Inversion: 5/5  Leg strength  Quad: 5/5 Hamstring: 5/5 Hip flexor: 5/5 Hip abductors: 3+/5  Gait unremarkable. No significant change from previous exam  Procedure: Real-time Ultrasound Guided Injection of left knee Device: GE Logiq E  Ultrasound guided injection is preferred based studies that show increased duration, increased effect, greater accuracy, decreased procedural pain, increased response rate, and decreased cost with ultrasound guided versus blind injection.  Verbal informed consent obtained.  Time-out conducted.  Noted no overlying erythema, induration, or other signs of local infection.  Skin prepped in a sterile fashion.  Local anesthesia: Topical Ethyl chloride.  With sterile technique and under real time ultrasound guidance: With a 22-gauge 2 inch needle patient was injected with 4 cc of 0.5% Marcaine and 1 cc of Kenalog 40 mg/dL. This was from a superior lateral approach.  Completed without difficulty  Pain immediately resolved suggesting accurate placement of the medication.  Advised to call if fevers/chills, erythema, induration, drainage, or persistent bleeding.  Images permanently stored and available for review in the ultrasound unit.  Impression: Technically successful ultrasound guided injection.  Procedure Note 54492; 15 minutes spent for Therapeutic exercises as stated in above notes.  This included exercises focusing on stretching, strengthening, with significant focus on eccentric aspects.   Proper technique shown and discussed handout in great detail with ATC.  All questions were discussed and answered.     Impression and Recommendations:     This case required medical decision making of moderate complexity.

## 2014-04-30 NOTE — Assessment & Plan Note (Signed)
Doing well with conservative therapy. Patient did have an injection previously and seems to be doing better.

## 2014-04-30 NOTE — Assessment & Plan Note (Signed)
She was given an injection today. Patient was also given a medial and lateral brace. We discussed home exercises and patient work with the athletic trainer today in great detail. We discussed icing regimen. Patient come back and see me again in 3-4 weeks to make sure she is improving. Patient could be a candidate for viscous supplementation.

## 2014-04-30 NOTE — Progress Notes (Signed)
Pre visit review using our clinic review tool, if applicable. No additional management support is needed unless otherwise documented below in the visit note. 

## 2014-05-01 ENCOUNTER — Other Ambulatory Visit: Payer: Self-pay | Admitting: Cardiology

## 2014-05-09 ENCOUNTER — Ambulatory Visit (INDEPENDENT_AMBULATORY_CARE_PROVIDER_SITE_OTHER): Payer: Commercial Managed Care - HMO | Admitting: *Deleted

## 2014-05-09 ENCOUNTER — Ambulatory Visit (INDEPENDENT_AMBULATORY_CARE_PROVIDER_SITE_OTHER): Payer: Commercial Managed Care - HMO | Admitting: Pharmacist

## 2014-05-09 DIAGNOSIS — I255 Ischemic cardiomyopathy: Secondary | ICD-10-CM | POA: Diagnosis not present

## 2014-05-09 DIAGNOSIS — Z5181 Encounter for therapeutic drug level monitoring: Secondary | ICD-10-CM

## 2014-05-09 DIAGNOSIS — Z7901 Long term (current) use of anticoagulants: Secondary | ICD-10-CM

## 2014-05-09 DIAGNOSIS — I4891 Unspecified atrial fibrillation: Secondary | ICD-10-CM

## 2014-05-09 LAB — MDC_IDC_ENUM_SESS_TYPE_INCLINIC
Battery Remaining Longevity: 61.2 mo
Brady Statistic RV Percent Paced: 3 %
HighPow Impedance: 48.1465
Implantable Pulse Generator Model: 2241
Lead Channel Impedance Value: 412.5 Ohm
Lead Channel Impedance Value: 587.5 Ohm
Lead Channel Pacing Threshold Amplitude: 0.5 V
Lead Channel Pacing Threshold Amplitude: 1 V
Lead Channel Pacing Threshold Pulse Width: 0.5 ms
Lead Channel Pacing Threshold Pulse Width: 0.5 ms
Lead Channel Sensing Intrinsic Amplitude: 11.8 mV
Lead Channel Setting Pacing Amplitude: 2 V
Lead Channel Setting Pacing Pulse Width: 0.5 ms
Lead Channel Setting Sensing Sensitivity: 0.5 mV
MDC IDC MSMT LEADCHNL RA SENSING INTR AMPL: 3.7 mV
MDC IDC PG SERIAL: 819806
MDC IDC SESS DTM: 20160217145032
MDC IDC SET LEADCHNL RV PACING AMPLITUDE: 2.5 V
MDC IDC SET ZONE DETECTION INTERVAL: 270 ms
MDC IDC STAT BRADY RA PERCENT PACED: 97 %
Zone Setting Detection Interval: 340 ms

## 2014-05-09 LAB — POCT INR: INR: 1.7

## 2014-05-09 NOTE — Progress Notes (Signed)
ICD check in clinic. Normal device function. Thresholds and sensing consistent with previous device measurements. Impedance trends stable over time. No evidence of any ventricular arrhythmias. No mode switches. Histogram distribution appropriate for patient and level of activity. No changes made this session. Device programmed at appropriate safety margins. Device programmed to optimize intrinsic conduction. Estimated longevity 4.9 years. Pt enrolled in remote follow-up. Alert tones/vibration demonstrated for patient. Merlin 08-08-14 and ROV in August with JA.

## 2014-05-13 ENCOUNTER — Other Ambulatory Visit: Payer: Self-pay | Admitting: Cardiology

## 2014-05-17 ENCOUNTER — Encounter: Payer: Self-pay | Admitting: Internal Medicine

## 2014-05-21 ENCOUNTER — Ambulatory Visit (INDEPENDENT_AMBULATORY_CARE_PROVIDER_SITE_OTHER): Payer: Commercial Managed Care - HMO | Admitting: Family Medicine

## 2014-05-21 ENCOUNTER — Ambulatory Visit (INDEPENDENT_AMBULATORY_CARE_PROVIDER_SITE_OTHER): Payer: Commercial Managed Care - HMO

## 2014-05-21 ENCOUNTER — Encounter: Payer: Self-pay | Admitting: Nurse Practitioner

## 2014-05-21 ENCOUNTER — Ambulatory Visit (INDEPENDENT_AMBULATORY_CARE_PROVIDER_SITE_OTHER): Payer: Commercial Managed Care - HMO | Admitting: Nurse Practitioner

## 2014-05-21 ENCOUNTER — Encounter: Payer: Self-pay | Admitting: Family Medicine

## 2014-05-21 VITALS — BP 98/64 | HR 74 | Temp 98.5°F | Ht 63.0 in | Wt 205.0 lb

## 2014-05-21 VITALS — BP 98/64 | HR 74 | Ht 63.0 in | Wt 205.0 lb

## 2014-05-21 DIAGNOSIS — I4891 Unspecified atrial fibrillation: Secondary | ICD-10-CM

## 2014-05-21 DIAGNOSIS — R059 Cough, unspecified: Secondary | ICD-10-CM

## 2014-05-21 DIAGNOSIS — Z7901 Long term (current) use of anticoagulants: Secondary | ICD-10-CM

## 2014-05-21 DIAGNOSIS — M129 Arthropathy, unspecified: Secondary | ICD-10-CM

## 2014-05-21 DIAGNOSIS — Z5181 Encounter for therapeutic drug level monitoring: Secondary | ICD-10-CM

## 2014-05-21 DIAGNOSIS — R05 Cough: Secondary | ICD-10-CM

## 2014-05-21 DIAGNOSIS — M1712 Unilateral primary osteoarthritis, left knee: Secondary | ICD-10-CM

## 2014-05-21 DIAGNOSIS — M4726 Other spondylosis with radiculopathy, lumbar region: Secondary | ICD-10-CM

## 2014-05-21 DIAGNOSIS — R062 Wheezing: Secondary | ICD-10-CM

## 2014-05-21 LAB — POCT INR: INR: 2.2

## 2014-05-21 MED ORDER — GUAIFENESIN ER 600 MG PO TB12: 600.0000 mg | ORAL_TABLET | Freq: Two times a day (BID) | ORAL | Status: DC

## 2014-05-21 MED ORDER — ALBUTEROL SULFATE HFA 108 (90 BASE) MCG/ACT IN AERS: INHALATION_SPRAY | RESPIRATORY_TRACT | Status: DC

## 2014-05-21 MED ORDER — DOXYCYCLINE HYCLATE 100 MG PO TABS: 100.0000 mg | ORAL_TABLET | Freq: Two times a day (BID) | ORAL | Status: DC

## 2014-05-21 NOTE — Patient Instructions (Addendum)
Start antibiotic. Eat yogurt daily to help prevent antibiotic -associated diarrhea  Take guaifenesen twice daily. Sip fluids throughout day.  Use inhaler twice daily followed by coughing exercises.  Take coumadin every other day until you are finished with doxycycline. Have INR checked in 1 week.  Please follow up in 2 weeks.

## 2014-05-21 NOTE — Assessment & Plan Note (Signed)
Responding well to the low dose of Effexor. Discussed continuing the natural supplementations as well. We will not increase patient's Effexor secondary to patient's cardiovascular history. Patient has tramadol for breakthrough pain and can go up 200 mg at one time. Patient and will come back and see me 8 weeks.

## 2014-05-21 NOTE — Assessment & Plan Note (Signed)
Patient is doing much better after the steroid injection. Encourage her to continue the home exercises the icing as well as the weight loss. Patient and will come back and see me again in 8 weeks for further evaluation. Patient is having worsening pain we will consider a steroid injection. Patient has pain in the interim patient would be a candidate for some viscus supple mentation.

## 2014-05-21 NOTE — Progress Notes (Signed)
Pre visit review using our clinic review tool, if applicable. No additional management support is needed unless otherwise documented below in the visit note. 

## 2014-05-21 NOTE — Progress Notes (Signed)
[facility] [address], [place] [address] Phone: [phone] Subjective:     CC: Back pain and right knee pain follow up  [id number] [name] is a 69 y.o. female coming in with complaint of low back pain as well as right knee pain.   Patient's low back pain she has been seen by neurosurgery. Patient has been told that she does have arthritis. Patient has had epidural steroid injections. Patient states that these do not seem to help. Patient has done 2 or maybe 3 rounds of physical therapy without any significant improvement. Last imaging that I have at this time is a CT of the lumbar spine in 2013 showing multifactorial spinal stenosis most notably at L4-L5. Patient also has moderate to severe disc space narrowing at L1-L2 and L2-L3 without nerve impingement.  Patient continues to have some discomfort. Patient though was started on Effexor at last follow-up for the potential of this being more of a chronic pain or chronic nerve impingement. Patient is already on gabapentin. Patient states with patient being on the flexor she is feeling somewhat better. Patient has lost some weight. No side effects except feeling better overall. Patient actually states that the exacerbation seen few and far between but the amplitude of them continues to be quite severe. She still does not know what the trigger is.  Patient is also complaining of left knee pain. Patient states is on the medial joint line. Patient has had an MCL injury previously. Patient does have moderate to severe osteophytic changes of the medial joint line on this side. Patient at last exam did have an ultrasound guided injection. Patient states she is doing significantly better. Patient would state that she's 100% better. Patient can go up stairs without any significant pain. Patient states sitting on a very low surfaces the only thing that causes some mild discomfort.     Past medical  history, social, surgical and family history all reviewed in electronic medical record.   Review of Systems: No headache, visual changes, nausea, vomiting, diarrhea, constipation, dizziness, abdominal pain, skin rash, fevers, chills, night sweats, weight loss, swollen lymph nodes, body aches, joint swelling, muscle aches, chest pain, shortness of breath, mood changes.   Objective Blood pressure 98/64, pulse 74, height 5\' 3"  (1.6 m), weight 205 lb (92.987 kg), SpO2 93 %.  General: No apparent distress alert and oriented x3 mood and affect normal, dressed appropriately. Overweight HEENT: Pupils equal, extraocular movements intact  Respiratory: Patient's speak in full sentences and does not appear short of breath  Cardiovascular: No lower extremity edema, non tender, no erythema  Skin: Warm dry intact with no signs of infection or rash on extremities or on axial skeleton.  Abdomen: Soft nontender  Neuro: Cranial nerves II through XII are intact, neurovascularly intact in all extremities with 2+ DTRs and 2+ pulses.  Lymph: No lymphadenopathy of posterior or anterior cervical chain or axillae bilaterally.  Gait normal with good balance and coordination.  MSK:  Non tender with full range of motion and good stability and symmetric strength and tone of shoulders, elbows, wrist, hip, and ankles bilaterally.  Knee: Bilateral Normal to inspection with no erythema or effusion or obvious bony abnormalities. Minimal tenderness over the medial joint line bilaterally ROM full in flexion and extension and lower leg rotation. Ligaments with solid consistent endpoints including ACL, PCL, LCL, MCL. Negative Mcmurray's, Apley's, and Thessalonian tests. Mild painful patellar compression Moderate crepitus noted on range of  motion Patellar and quadriceps tendons unremarkable. Hamstring and quadriceps strength is normal.    Back Exam:  Inspection: Unremarkable  Motion: Flexion 30 deg, Extension 35 deg, Side  Bending to 35 deg bilaterally,  Rotation to 35 deg bilaterally  SLR laying: Negative  XSLR laying: Negative  Palpable tenderness: None. FABER: Positive right still present but improving Sensory change: Gross sensation intact to all lumbar and sacral dermatomes.  Reflexes: 2+ at both patellar tendons, 2+ at achilles tendons, Babinski's downgoing.  Strength at foot  Plantar-flexion: 5/5 Dorsi-flexion: 5/5 Eversion: 5/5 Inversion: 5/5  Leg strength  Quad: 5/5 Hamstring: 5/5 Hip flexor: 5/5 Hip abductors: 3+/5  Gait unremarkable. No significant change from previous exam       Impression and Recommendations:     This case required medical decision making of moderate complexity.

## 2014-05-21 NOTE — Progress Notes (Signed)
   Subjective:    Patient ID: [name], female    DOB: [birth year], 69 y.o.   MRN: [id number]  HPI Comments: She is accompanied by daughter today. Pt states fasting BS is about 130-normal for her.  Cough This is a new problem. The current episode started more than 1 month ago (coughing since January, got better, then started again). The problem has been gradually worsening. The problem occurs every few hours. The cough is productive of sputum. Associated symptoms include chest pain, ear congestion, nasal congestion, postnasal drip and wheezing. Pertinent negatives include no chills, ear pain, fever, headaches, myalgias, sore throat or shortness of breath. Associated symptoms comments: Complaining of pain in stomach & chest-comes & goes. The symptoms are aggravated by lying down. Treatments tried: no recent treatment. She was prescribed levaquin 6 weeks ago. The treatment provided significant relief. DM      Review of Systems  Constitutional: Positive for fatigue. Negative for fever and chills.  HENT: Positive for congestion and postnasal drip. Negative for ear pain and sore throat.   Respiratory: Positive for cough, chest tightness and wheezing. Negative for shortness of breath.   Cardiovascular: Positive for chest pain.  Musculoskeletal: Negative for myalgias.  Neurological: Negative for headaches.       Objective:   Physical Exam  Constitutional: She is oriented to person, place, and time. She appears well-developed and well-nourished. No distress.  HENT:  Head: Normocephalic and atraumatic.  Right Ear: External ear normal.  Left Ear: External ear normal.  Mouth/Throat: Oropharynx is clear and moist. No oropharyngeal exudate.  Nasal quality to voice  Eyes: Conjunctivae are normal.  Neck: Normal range of motion. Neck supple. No thyromegaly present.  Cardiovascular: Normal rate, regular rhythm and normal heart sounds.   No murmur heard. Pulmonary/Chest: Effort normal. No  respiratory distress. She has wheezes (upper lobes bilat, clears with coughing). She has no rales.  Lymphadenopathy:    She has no cervical adenopathy.  Neurological: She is alert and oriented to person, place, and time.  Skin: Skin is warm and dry.  Psychiatric: She has a normal mood and affect. Her behavior is normal. Thought content normal.  Vitals reviewed.         Assessment & Plan:  1. Cough DD: bronchitis, pneumonia - doxycycline (VIBRA-TABS) 100 MG tablet; Take 1 tablet (100 mg total) by mouth 2 (two) times daily.  Dispense: 14 tablet; Refill: 0  2. Wheezing - doxycycline (VIBRA-TABS) 100 MG tablet; Take 1 tablet (100 mg total) by mouth 2 (two) times daily.  Dispense: 14 tablet; Refill: 0 - albuterol (PROVENTIL HFA;VENTOLIN HFA) 108 (90 BASE) MCG/ACT inhaler; Use 2 puffs twice daily for 4 days, then 2 puffs q2h PRN cough/wheezing  Dispense: 1 Inhaler; Refill: 0 - guaiFENesin (MUCINEX) 600 MG 12 hr tablet; Take 1 tablet (600 mg total) by mouth 2 (two) times daily.  Dispense: 20 tablet; Refill: 0  F/U 2 weeks, sooner if worse

## 2014-05-21 NOTE — Patient Instructions (Addendum)
Good to see you Continue the icing, exercises and everything you are doing.  You look great, keep losing weight.  For your back effexor still but not going to go up  See me again in 8 weeks.

## 2014-05-22 DIAGNOSIS — R059 Cough, unspecified: Secondary | ICD-10-CM | POA: Insufficient documentation

## 2014-05-22 DIAGNOSIS — R05 Cough: Secondary | ICD-10-CM | POA: Insufficient documentation

## 2014-05-24 ENCOUNTER — Other Ambulatory Visit: Payer: Self-pay | Admitting: Internal Medicine

## 2014-05-24 ENCOUNTER — Other Ambulatory Visit (HOSPITAL_COMMUNITY): Payer: Self-pay | Admitting: *Deleted

## 2014-05-25 ENCOUNTER — Telehealth: Payer: Self-pay | Admitting: Internal Medicine

## 2014-05-25 NOTE — Telephone Encounter (Signed)
Please decrease the insulin as follows: - Levemir 25 >> 20 units in am and 50 >> 40 units at bedtime.  - Mealtime Humalog doses:  15 units for a smaller meal (or breakfast) >> 14 units 18 units for a medium meal >> 16 units 20 units before a large meal (or dinner) >> 18 units If sugars are <60 before a meal, please take at least half of the dose planned. - Humalog sliding scale:  - 150- 165: + 2 unit  - 166- 180: + 3 units  - 181- 195: + 4 units  - 196- 210: + 5 units  - >210: + 6 units  - Metformin XR 1000 mg 2x a day.   Please call on Monday with sugars.

## 2014-05-25 NOTE — Telephone Encounter (Signed)
Patient called and would like to speak with [facility] regarding her blood sugar   This morning was 63 Before bed was 57  She drank orange juice and it increased to 71  Please advise patient   Thank you

## 2014-05-25 NOTE — Telephone Encounter (Signed)
Please read message below and advise.  

## 2014-05-25 NOTE — Telephone Encounter (Signed)
Called pt and advised her per Dr Gherghe's note. Pt understood.  

## 2014-05-27 ENCOUNTER — Other Ambulatory Visit: Payer: Self-pay | Admitting: Internal Medicine

## 2014-05-27 ENCOUNTER — Other Ambulatory Visit: Payer: Self-pay | Admitting: Cardiology

## 2014-05-28 ENCOUNTER — Other Ambulatory Visit (HOSPITAL_COMMUNITY): Payer: Self-pay | Admitting: *Deleted

## 2014-06-05 ENCOUNTER — Other Ambulatory Visit (HOSPITAL_COMMUNITY): Payer: Self-pay | Admitting: Cardiology

## 2014-06-06 ENCOUNTER — Telehealth: Payer: Self-pay | Admitting: Internal Medicine

## 2014-06-06 ENCOUNTER — Encounter: Payer: Self-pay | Admitting: Internal Medicine

## 2014-06-06 ENCOUNTER — Ambulatory Visit (INDEPENDENT_AMBULATORY_CARE_PROVIDER_SITE_OTHER): Payer: Commercial Managed Care - HMO | Admitting: Internal Medicine

## 2014-06-06 VITALS — BP 122/76 | HR 65 | Temp 97.9°F | Resp 18 | Ht 63.0 in | Wt 203.4 lb

## 2014-06-06 DIAGNOSIS — Z0000 Encounter for general adult medical examination without abnormal findings: Secondary | ICD-10-CM

## 2014-06-06 DIAGNOSIS — J019 Acute sinusitis, unspecified: Secondary | ICD-10-CM | POA: Insufficient documentation

## 2014-06-06 DIAGNOSIS — Z0189 Encounter for other specified special examinations: Secondary | ICD-10-CM

## 2014-06-06 DIAGNOSIS — J309 Allergic rhinitis, unspecified: Secondary | ICD-10-CM | POA: Insufficient documentation

## 2014-06-06 DIAGNOSIS — R062 Wheezing: Secondary | ICD-10-CM

## 2014-06-06 MED ORDER — MONTELUKAST SODIUM 10 MG PO TABS: 10.0000 mg | ORAL_TABLET | Freq: Every day | ORAL | Status: DC

## 2014-06-06 MED ORDER — ROSUVASTATIN CALCIUM 20 MG PO TABS: 20.0000 mg | ORAL_TABLET | Freq: Every day | ORAL | Status: DC

## 2014-06-06 MED ORDER — LEVOFLOXACIN 250 MG PO TABS: 250.0000 mg | ORAL_TABLET | Freq: Every day | ORAL | Status: DC

## 2014-06-06 NOTE — Assessment & Plan Note (Signed)
For singulair, consider OTC nasacort but pt is hesitant

## 2014-06-06 NOTE — Patient Instructions (Addendum)
Please take all new medication as prescribed - the antibiotic, and singulair for allergies  Please also consider taking OTC Nasacort for your allergies  Please continue all other medications as before, and refills have been done if requested.  Please have the pharmacy call with any other refills you may need.  Please continue your efforts at being more active, low cholesterol diet, and weight control  Please keep your appointments with your specialists as you may have planned  Please return in 6 months, or sooner if needed, with Lab testing done 3-5 days before

## 2014-06-06 NOTE — Progress Notes (Signed)
Subjective:    Patient ID: [name], female    DOB: 02/20/1946, 69 y.o.   MRN: [id number]  HPI   Here with 2-3 days acute onset fever, facial pain, pressure, headache, general weakness and malaise, and greenish d/c, with mild ST and cough, but pt denies chest pain, wheezing, increased sob or doe, orthopnea, PND, increased LE swelling, palpitations, dizziness or syncope, has mostly left facial pain and still mod prod cough, though chest symptoms have resolved - no wheezing or increased sob.  Does have several wks ongoing nasal allergy symptoms with clearish congestion, itch and sneezing, without fever, pain, ST, cough, swelling or wheezing, but is hesitatant to take anything as zyrtec caused next day fatigue hangover, and she recalls a cardiologist told her to not take nasal spray, though shes not sure which one. Also needs referral to cards update Past Medical History  Diagnosis Date  . Chronic systolic heart failure   . Diabetes mellitus     type 1  . Hypertension   . Stroke 2010    eye doctor said she had TIA  . Ischemic cardiomyopathy   . Ventricular tachycardia     Polymorphic  . 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   . Atrial fibrillation     controlled with amiodarone, on coumadin  . Coronary artery disease 01/31/2011  . Chronic renal insufficiency   . Lumbar spondylosis 01/11/2012  . History of chicken pox   . Allergy   . Hyperlipidemia   . Arthritis   . Congestive heart failure    Past Surgical History  Procedure Laterality Date  . Abdominal hysterectomy  1995  . Cholecystectomy  1985  . Tubaligation  1980  . Icd  2003/2007    implanted by Dr Alanda Amass, most recent generator change 2/13 by Dr Johney Frame, Analyze ST study patient  . Cardiac defibrillator placement    . Left heart catheterization with coronary angiogram N/A 01/30/2011    Procedure: LEFT HEART CATHETERIZATION WITH CORONARY ANGIOGRAM;  Surgeon: Laurey Morale, MD;  Location: [facility] CATH LAB;   Service: Cardiovascular;  Laterality: N/A;  . Implantable cardioverter defibrillator (icd) generator change N/A 05/05/2011    Procedure: ICD GENERATOR CHANGE;  Surgeon: Hillis Range, MD;  Location: [facility] CATH LAB;  Service: Cardiovascular;  Laterality: N/A;    reports that she quit smoking about 18 years ago. She has never used smokeless tobacco. She reports that she does not drink alcohol or use illicit drugs. family history includes Breast cancer in her sister; Colon cancer in her maternal aunt; Diabetes in her mother; Early death (age of onset: 73) in her brother; Heart attack in her father; Heart disease in her father and mother; Hyperlipidemia in her mother; Hypertension in her father and mother; Irritable bowel syndrome in her sister; Lung cancer in her sister. There is no history of Esophageal cancer or Colon polyps. Allergies  Allergen Reactions  . Darvon Other (See Comments)    indigestion  . Promethazine Hcl Other (See Comments)    hyperactivity  . Plavix [Clopidogrel Bisulfate] Rash   Current Outpatient Prescriptions on File Prior to Visit  Medication Sig Dispense Refill  . albuterol (PROVENTIL HFA;VENTOLIN HFA) 108 (90 BASE) MCG/ACT inhaler Use 2 puffs twice daily for 4 days, then 2 puffs q2h PRN cough/wheezing 1 Inhaler 0  . amiodarone (PACERONE) 200 MG tablet TAKE 1 TABLET (200 MG TOTAL) BY MOUTH DAILY. 60 tablet 2  . amiodarone (PACERONE) 200 MG tablet TAKE 1 TABLET (200 MG TOTAL)  BY MOUTH DAILY. 60 tablet 2  . Blood Glucose Monitoring Suppl (ACCU-CHEK NANO SMARTVIEW) W/DEVICE KIT Use to test blood sugar 4 times daily as instructed. Dx code: E11.59 1 kit 0  . carvedilol (COREG) 25 MG tablet TAKE 1 TABLET BY MOUTH TWICE A DAY WITH A MEAL 60 tablet 3  . cetirizine (ZYRTEC) 10 MG tablet Take 10 mg by mouth daily.    . Cholecalciferol (VITAMIN D3) 2000 UNITS TABS Take 2,000 Units by mouth daily.     . CRESTOR 20 MG tablet TAKE 1 TABLET BY MOUTH EVERY DAY 30 tablet 1  . doxycycline  (VIBRA-TABS) 100 MG tablet Take 1 tablet (100 mg total) by mouth 2 (two) times daily. 14 tablet 0  . furosemide (LASIX) 20 MG tablet TAKE 1 TABLET (20 MG TOTAL) BY MOUTH DAILY. 30 tablet 3  . gabapentin (NEURONTIN) 400 MG capsule Take 1 capsule (400 mg total) by mouth 2 (two) times daily. (Patient taking differently: Take 400 mg by mouth 2 (two) times daily after a meal. ) 180 capsule 3  . glucose blood (ACCU-CHEK SMARTVIEW) test strip Use to test blood sugar 4 times daily as instructed. Dx code E11.59 125 each 3  . glycopyrrolate (ROBINUL) 1 MG tablet Take 1 tablet (1 mg total) by mouth 2 (two) times daily. 60 tablet 6  . guaiFENesin (MUCINEX) 600 MG 12 hr tablet Take 1 tablet (600 mg total) by mouth 2 (two) times daily. 20 tablet 0  . Insulin Detemir (LEVEMIR FLEXTOUCH) 100 UNIT/ML Pen Inject under skin 25 units in am and 50 units at bedtime 45 mL 1  . insulin lispro (HUMALOG) 100 UNIT/ML injection Inject 0.12-0.3 mLs (12-30 Units total) into the skin 3 (three) times daily before meals. 20 mL 11  . lisinopril (PRINIVIL,ZESTRIL) 5 MG tablet TAKE 1 TABLET BY MOUTH DAILY 30 tablet 6  . metFORMIN (GLUCOPHAGE-XR) 500 MG 24 hr tablet Take 1 tablet (500 mg total) by mouth daily with breakfast. 120 tablet 2  . Multiple Vitamins-Minerals (EYE VITAMINS PO) Take 1 tablet by mouth daily.     . nitroGLYCERIN (NITROSTAT) 0.4 MG SL tablet Place 1 tablet (0.4 mg total) under the tongue every 5 (five) minutes as needed for chest pain. 25 tablet 1  . nizatidine (AXID) 150 MG capsule Take 1 capsule (150 mg total) by mouth daily. 90 capsule 3  . omeprazole (PRILOSEC) 20 MG capsule TAKE 1 CAPSULE BY MOUTH DAILY. 30 capsule 5  . rosuvastatin (CRESTOR) 20 MG tablet Take 20 mg by mouth daily.    . traMADol (ULTRAM) 50 MG tablet Take 1 tablet (50 mg total) by mouth every 6 (six) hours as needed. 120 tablet 1  . venlafaxine (EFFEXOR) 37.5 MG tablet Take 1 tablet (37.5 mg total) by mouth 2 (two) times daily. 60 tablet 1  .  Vitamin D, Ergocalciferol, (DRISDOL) 50000 UNITS CAPS capsule Take 50,000 Units by mouth every 7 (seven) days. monday    . Vitamin D, Ergocalciferol, (DRISDOL) 50000 UNITS CAPS capsule TAKE ONE CAPSULE BY MOUTH ONCE A WEEK 4 capsule 5  . warfarin (COUMADIN) 1 MG tablet Take 1 tablet (1 mg total) by mouth daily. (Patient taking differently: Take 1 mg by mouth daily. Pt currently taking 2.5 mg, but is being reduced down to 1 mg once pt finish medicine currently on.) 40 tablet 3   No current facility-administered medications on file prior to visit.    Review of Systems  Constitutional: Negative for unusual diaphoresis or night sweats HENT:  Negative for ringing in ear or discharge Eyes: Negative for double vision or worsening visual disturbance.  Respiratory: Negative for choking and stridor.   Gastrointestinal: Negative for vomiting or other signifcant bowel change Genitourinary: Negative for hematuria or change in urine volume.  Musculoskeletal: Negative for other MSK pain or swelling Skin: Negative for color change and worsening wound.  Neurological: Negative for tremors and numbness other than noted  Psychiatric/Behavioral: Negative for decreased concentration or agitation other than above       Objective:   Physical Exam BP 122/76 mmHg  Pulse 65  Temp(Src) 97.9 F (36.6 C) (Oral)  Resp 18  Ht $R'5\' 3"'lp$  (1.6 m)  Wt 203 lb 6.4 oz (92.262 kg)  BMI 36.04 kg/m2  SpO2 93% VS noted, mild ill Constitutional: Pt appears in no significant distress HENT: Head: NCAT.  Right Ear: External ear normal.  Left Ear: External ear normal.  Eyes: . Pupils are equal, round, and reactive to light. Conjunctivae and EOM are normal Bilat tm's with mild erythema.  Max sinus areas mild tender lfet > right.  Pharynx with mild erythema, no exudate Neck: Normal range of motion. Neck supple.  Cardiovascular: Normal rate and regular rhythm.   Pulmonary/Chest: Effort normal and breath sounds without rales or  wheezing.  Neurological: Pt is alert. Not confused , motor grossly intact Skin: Skin is warm. No rash, no LE edema Psychiatric: Pt behavior is normal. No agitation.      Assessment & Plan:

## 2014-06-06 NOTE — Assessment & Plan Note (Signed)
Mild to mod, for antibx course,  to f/u any worsening symptoms or concerns 

## 2014-06-06 NOTE — Telephone Encounter (Signed)
Ok to take together, as this is often taken together even for when the amiodarone is actually causing the low thyroid

## 2014-06-06 NOTE — Telephone Encounter (Signed)
Needing clarification on RX. levofloxacin (LEVAQUIN) 250 MG tablet [[id number]] is conflicting against amiodarone (PACERONE) 200 MG tablet [[id number]]

## 2014-06-06 NOTE — Progress Notes (Signed)
Pre visit review using our clinic review tool, if applicable. No additional management support is needed unless otherwise documented below in the visit note. 

## 2014-06-06 NOTE — Assessment & Plan Note (Signed)
Resolved, no further tx needed 

## 2014-06-07 ENCOUNTER — Other Ambulatory Visit (HOSPITAL_COMMUNITY): Payer: Self-pay | Admitting: Cardiology

## 2014-06-07 ENCOUNTER — Telehealth: Payer: Self-pay | Admitting: Cardiology

## 2014-06-07 NOTE — Telephone Encounter (Signed)
New Message        Pt calling stating that she wants to find out when Dr. Aundra Dubin wants to see her again. As of right now there is no recall in Epic. Please call back and advise.

## 2014-06-07 NOTE — Telephone Encounter (Signed)
Dr Aundra Dubin saw her last 03/20/13--his note says that he will see her in 4 months in the Heart Failure Clinic at [facility] let her know.   The phone number for Heart Failure is [phone], they will need to schedule the appointment.  Thanks.

## 2014-06-07 NOTE — Telephone Encounter (Signed)
Lmsg for pt to call back

## 2014-06-07 NOTE — Telephone Encounter (Signed)
Pt. Is aware. Pt. Feels better knowing it is ok to continue taking these medications.

## 2014-06-08 ENCOUNTER — Other Ambulatory Visit: Payer: Self-pay | Admitting: Cardiology

## 2014-06-11 ENCOUNTER — Ambulatory Visit (INDEPENDENT_AMBULATORY_CARE_PROVIDER_SITE_OTHER): Payer: Commercial Managed Care - HMO | Admitting: *Deleted

## 2014-06-11 DIAGNOSIS — I4891 Unspecified atrial fibrillation: Secondary | ICD-10-CM

## 2014-06-11 DIAGNOSIS — Z5181 Encounter for therapeutic drug level monitoring: Secondary | ICD-10-CM

## 2014-06-11 DIAGNOSIS — Z7901 Long term (current) use of anticoagulants: Secondary | ICD-10-CM

## 2014-06-11 LAB — POCT INR: INR: 1.8

## 2014-06-23 ENCOUNTER — Other Ambulatory Visit: Payer: Self-pay | Admitting: Family Medicine

## 2014-06-25 NOTE — Telephone Encounter (Signed)
Refill done.  

## 2014-06-26 ENCOUNTER — Ambulatory Visit (INDEPENDENT_AMBULATORY_CARE_PROVIDER_SITE_OTHER): Payer: Commercial Managed Care - HMO | Admitting: *Deleted

## 2014-06-26 DIAGNOSIS — Z7901 Long term (current) use of anticoagulants: Secondary | ICD-10-CM | POA: Diagnosis not present

## 2014-06-26 DIAGNOSIS — I4891 Unspecified atrial fibrillation: Secondary | ICD-10-CM

## 2014-06-26 DIAGNOSIS — Z5181 Encounter for therapeutic drug level monitoring: Secondary | ICD-10-CM

## 2014-06-26 LAB — POCT INR: INR: 1.9

## 2014-07-11 ENCOUNTER — Ambulatory Visit (INDEPENDENT_AMBULATORY_CARE_PROVIDER_SITE_OTHER): Payer: Commercial Managed Care - HMO | Admitting: *Deleted

## 2014-07-11 DIAGNOSIS — Z7901 Long term (current) use of anticoagulants: Secondary | ICD-10-CM

## 2014-07-11 DIAGNOSIS — I4891 Unspecified atrial fibrillation: Secondary | ICD-10-CM

## 2014-07-11 DIAGNOSIS — Z5181 Encounter for therapeutic drug level monitoring: Secondary | ICD-10-CM

## 2014-07-11 LAB — POCT INR: INR: 2

## 2014-07-19 ENCOUNTER — Ambulatory Visit (INDEPENDENT_AMBULATORY_CARE_PROVIDER_SITE_OTHER): Payer: Commercial Managed Care - HMO | Admitting: Internal Medicine

## 2014-07-19 ENCOUNTER — Encounter: Payer: Self-pay | Admitting: Internal Medicine

## 2014-07-19 VITALS — BP 104/62 | HR 64 | Temp 98.1°F | Resp 12 | Wt 200.0 lb

## 2014-07-19 DIAGNOSIS — E1159 Type 2 diabetes mellitus with other circulatory complications: Secondary | ICD-10-CM | POA: Diagnosis not present

## 2014-07-19 DIAGNOSIS — E1151 Type 2 diabetes mellitus with diabetic peripheral angiopathy without gangrene: Secondary | ICD-10-CM

## 2014-07-19 DIAGNOSIS — IMO0002 Reserved for concepts with insufficient information to code with codable children: Secondary | ICD-10-CM

## 2014-07-19 DIAGNOSIS — E1165 Type 2 diabetes mellitus with hyperglycemia: Principal | ICD-10-CM

## 2014-07-19 LAB — HEMOGLOBIN A1C: Hgb A1c MFr Bld: 6.9 % — ABNORMAL HIGH (ref 4.6–6.5)

## 2014-07-19 LAB — HM DIABETES FOOT EXAM: HM Diabetic Foot Exam: DECREASED

## 2014-07-19 MED ORDER — INSULIN LISPRO 100 UNIT/ML ~~LOC~~ SOLN: 6.0000 [IU] | Freq: Three times a day (TID) | SUBCUTANEOUS | Status: DC

## 2014-07-19 MED ORDER — GLUCOSE BLOOD VI STRP: ORAL_STRIP | Status: DC

## 2014-07-19 MED ORDER — METFORMIN HCL ER 500 MG PO TB24: 500.0000 mg | ORAL_TABLET | Freq: Two times a day (BID) | ORAL | Status: DC

## 2014-07-19 MED ORDER — INSULIN DETEMIR 100 UNIT/ML FLEXPEN: PEN_INJECTOR | SUBCUTANEOUS | Status: DC

## 2014-07-19 NOTE — Patient Instructions (Signed)
Please decrease Levemir to 20 units in am and 20 units at bedtime. You can try to move all Levemir 40 units at bedtime if you have no lows in am.  Continue Humalog: - 6 units before a small meal - 8 units before a large meal  Change Humalog Sliding scale: - 150-175: + 1 unit  - 176-200: + 2 units  - 201-225: + 3 units  - > 226: + 4 units   Please stop at the lab.  Please come back for a follow-up appointment in 3 months with your log.

## 2014-07-19 NOTE — Progress Notes (Signed)
Patient ID: [name], female   DOB: Sep 17, 1945, 69 y.o.   MRN: [id number]   HPI: [name] is a 69 y.o.-year-old female, returning for f/u for DM2, insulin-dependent, uncontrolled, with complications (CAD, ICM - had ICD, peripheral neuropathy, gastroparesis). Last visit 3 mo ago.  She had sinusitis in 04/2014 >> still on ABx.  Last hemoglobin A1c was: Lab Results  Component Value Date   HGBA1C 8.2* 04/19/2014   HGBA1C 8.1* 01/16/2014   HGBA1C 7.8* 10/16/2013  07/03/2013: 7.2% - through her insurance   Pt is on a regimen of: Please decrease the insulin as follows: - Levemir 20 units in am and 40 units at bedtime.  - Mealtime Humalog doses:  6 units for a meal - Humalog sliding scale:  - 150- 165: + 2 unit  - 166- 180: + 3 units  - 181- 195: + 4 units  - 196- 210: + 5 units  - >210: + 6 units  - Metformin XR 500 mg 1x a day >> not taking 1000 2x a day!  Pt checks her sugars 3-4 a day and they are better, but still fluctuating- brings a good log: - am: 148-246, 260 >> 125-219, 246 (higher values after cough syrup at night) >> 72-140, 157 - before lunch: 57 x1, 66-160, spikes at 200 >> 58x1, 75,111-183 >> 51-140, 180 >> 50-118, 170, 298 - after lunch: 55-171 >> n/c >> 100 >> n/c >> n/c - before dinner: 78-293 >> 61-160, 241 - bedtime: 85-265 >> 79-200, 220 x 1, 237, 307 x1 >> 60, 101-188, 241 >> 74-180, some 200s >> 100-240 Lowest sugar was 44  she has hypoglycemia awareness at 70. Highest sugar was 297.  Pt's meals are: - Breakfast: small bowl of cereal frosted with 2% milk - Lunch (5-14 units): vegetable + starch; turkey/ham sandwich - Dinner (20 units): fast food or cooks: meat + vegetables + sometimes potatoes - Snacks: fruit (apple) Did not see nutrition yet >> did not have transportation She has a glass of 2% milk before bedtime. Tried Almond milk but did not like it as it was too sweet.  - Has CKD, last BUN/creatinine:  Lab Results  Component  Value Date   BUN 34* 12/05/2013   CREATININE 1.24* 12/05/2013  ACR in 12/15/2012: 1.0.  On Lisinopril. - last set of lipids: Lab Results  Component Value Date   CHOL 152 12/05/2013   HDL 50 12/05/2013   LDLCALC 81 12/05/2013   TRIG 107 12/05/2013   CHOLHDL 3.0 12/05/2013  she is on Crestor. - last eye exam was in 11/2013. No DR.  - + numbness and tingling in her feet - on neurontin.  I reviewed pt's medications, allergies, PMH, social hx, family hx, and changes were documented in the history of present illness. Otherwise, unchanged from my initial visit note.  ROS: Constitutional: + weight loss, + fatigue, no subjective hyperthermia/hypothermia, + poor sleep  Eyes: no blurry vision, no xerophthalmia ENT: no sore throat, no nodules palpated in throat, no dysphagia/odynophagia, + hoarseness, + tinnitus Cardiovascular: no CP/no SOB/no palpitations/leg swelling Respiratory: no cough/+ SOB Gastrointestinal: + N/+V/+D/+C Musculoskeletal: no muscle/joint aches and swelling Skin: no rashes, + excessive hair growth  PE: BP 104/62 mmHg  Pulse 64  Temp(Src) 98.1 F (36.7 C) (Oral)  Resp 12  Wt 200 lb (90.719 kg)  SpO2 95% Body mass index is 35.44 kg/(m^2).  Wt Readings from Last 3 Encounters:  07/19/14 200 lb (90.719 kg)  06/06/14 203 lb 6.4  oz (92.262 kg)  05/21/14 205 lb (92.987 kg)   Constitutional: overweight, in NAD, full supraclavicular fat pads Eyes: PERRLA, EOMI, no exophthalmos ENT: moist mucous membranes, no thyromegaly, no cervical lymphadenopathy Cardiovascular: RRR, No MRG, + nonpitting LE edema Respiratory: CTA B Gastrointestinal: abdomen soft, NT, ND, BS+ Musculoskeletal: no deformities, strength intact in all 4 Skin: moist, warm, no rashes  ASSESSMENT: 1. DM2, insulin-dependent, uncontrolled, with complications - CAD, ICM - s/p ICD placement - CKD - PN - on neurontin - gastroparesis per [facility]ES 06/18/2012 >> 60 minutes: 92%, 120 minutes: 82%  PLAN:  1.  Patient with long-standing, uncontrolled diabetes, on basal bolus regimen, with still much improved sugars after starting Metformin XR, to the point of low CBGs. - We will decrease her Levemir further since having lows, will increase Metformin XR to 500 mg daily for now, increase insulin with a larger meal or dessert and change her SSI of Humalog. Patient Instructions  Please decrease Levemir to 20 units in am and 20 units at bedtime. You can try to move all Levemir 40 units at bedtime if you have no lows in am.  Continue Humalog: - 6 units before a small meal - 8 units before a large meal  Change Humalog Sliding scale: - 150-175: + 1 unit  - 176-200: + 2 units  - 201-225: + 3 units  - > 226: + 4 units   Increase Metformin XR to 500 mg 2x a day.  Please stop at the lab.  Please come back for a follow-up appointment in 3 months with your log.  - continue checking her sugars at different times of the day - check 4 times a day - up to date with eye exams  - will check a HbA1c today - foot exam performed today >> neuropathy - refilled strips Return in about 3 months (around 10/18/2014).  Office Visit on 07/19/2014  Component Date Value Ref Range Status  . HM Diabetic Foot Exam 07/19/2014 decreased sensation to filament   Final   done  . Hgb A1c MFr Bld 07/19/2014 6.9* 4.6 - 6.5 % Final   Glycemic Control Guidelines for People with Diabetes:Non Diabetic:  <6%Goal of Therapy: <7%Additional Action Suggested:  >8%    Great improvement in hemoglobin A1c. Continue with the plan to decrease the insulin as mentioned above.

## 2014-07-25 ENCOUNTER — Ambulatory Visit (INDEPENDENT_AMBULATORY_CARE_PROVIDER_SITE_OTHER): Payer: Commercial Managed Care - HMO | Admitting: *Deleted

## 2014-07-25 DIAGNOSIS — I4891 Unspecified atrial fibrillation: Secondary | ICD-10-CM

## 2014-07-25 DIAGNOSIS — Z5181 Encounter for therapeutic drug level monitoring: Secondary | ICD-10-CM

## 2014-07-25 DIAGNOSIS — Z7901 Long term (current) use of anticoagulants: Secondary | ICD-10-CM | POA: Diagnosis not present

## 2014-07-25 LAB — POCT INR: INR: 3.1

## 2014-08-06 ENCOUNTER — Other Ambulatory Visit (HOSPITAL_COMMUNITY): Payer: Self-pay | Admitting: Internal Medicine

## 2014-08-08 ENCOUNTER — Telehealth: Payer: Self-pay | Admitting: Cardiology

## 2014-08-08 ENCOUNTER — Ambulatory Visit (HOSPITAL_COMMUNITY)
Admission: RE | Admit: 2014-08-08 | Discharge: 2014-08-08 | Disposition: A | Discharge status: Home / Self Care | Payer: Commercial Managed Care - HMO | Source: Ambulatory Visit | Attending: Internal Medicine | Admitting: Internal Medicine

## 2014-08-08 ENCOUNTER — Ambulatory Visit (INDEPENDENT_AMBULATORY_CARE_PROVIDER_SITE_OTHER): Payer: Commercial Managed Care - HMO | Admitting: *Deleted

## 2014-08-08 VITALS — BP 120/68 | HR 61 | Wt 197.4 lb

## 2014-08-08 DIAGNOSIS — I472 Ventricular tachycardia: Secondary | ICD-10-CM | POA: Insufficient documentation

## 2014-08-08 DIAGNOSIS — N183 Chronic kidney disease, stage 3 (moderate): Secondary | ICD-10-CM

## 2014-08-08 DIAGNOSIS — I351 Nonrheumatic aortic (valve) insufficiency: Secondary | ICD-10-CM | POA: Diagnosis not present

## 2014-08-08 DIAGNOSIS — Z87891 Personal history of nicotine dependence: Secondary | ICD-10-CM | POA: Diagnosis not present

## 2014-08-08 DIAGNOSIS — Z794 Long term (current) use of insulin: Secondary | ICD-10-CM | POA: Diagnosis not present

## 2014-08-08 DIAGNOSIS — Z5181 Encounter for therapeutic drug level monitoring: Secondary | ICD-10-CM | POA: Diagnosis not present

## 2014-08-08 DIAGNOSIS — Z7901 Long term (current) use of anticoagulants: Secondary | ICD-10-CM

## 2014-08-08 DIAGNOSIS — I255 Ischemic cardiomyopathy: Secondary | ICD-10-CM

## 2014-08-08 DIAGNOSIS — E1143 Type 2 diabetes mellitus with diabetic autonomic (poly)neuropathy: Secondary | ICD-10-CM | POA: Insufficient documentation

## 2014-08-08 DIAGNOSIS — I5022 Chronic systolic (congestive) heart failure: Secondary | ICD-10-CM | POA: Diagnosis not present

## 2014-08-08 DIAGNOSIS — N189 Chronic kidney disease, unspecified: Secondary | ICD-10-CM | POA: Insufficient documentation

## 2014-08-08 DIAGNOSIS — I4891 Unspecified atrial fibrillation: Secondary | ICD-10-CM

## 2014-08-08 DIAGNOSIS — K3184 Gastroparesis: Secondary | ICD-10-CM | POA: Diagnosis not present

## 2014-08-08 DIAGNOSIS — Z79899 Other long term (current) drug therapy: Secondary | ICD-10-CM | POA: Insufficient documentation

## 2014-08-08 DIAGNOSIS — Z8673 Personal history of transient ischemic attack (TIA), and cerebral infarction without residual deficits: Secondary | ICD-10-CM | POA: Insufficient documentation

## 2014-08-08 DIAGNOSIS — I480 Paroxysmal atrial fibrillation: Secondary | ICD-10-CM | POA: Insufficient documentation

## 2014-08-08 DIAGNOSIS — I129 Hypertensive chronic kidney disease with stage 1 through stage 4 chronic kidney disease, or unspecified chronic kidney disease: Secondary | ICD-10-CM | POA: Diagnosis not present

## 2014-08-08 DIAGNOSIS — I2510 Atherosclerotic heart disease of native coronary artery without angina pectoris: Secondary | ICD-10-CM | POA: Diagnosis not present

## 2014-08-08 LAB — COMPREHENSIVE METABOLIC PANEL
ALK PHOS: 63 U/L (ref 38–126)
ALT: 21 U/L (ref 14–54)
AST: 22 U/L (ref 15–41)
Albumin: 3.2 g/dL — ABNORMAL LOW (ref 3.5–5.0)
Anion gap: 8 (ref 5–15)
BUN: 27 mg/dL — ABNORMAL HIGH (ref 6–20)
CALCIUM: 8.8 mg/dL — AB (ref 8.9–10.3)
CO2: 27 mmol/L (ref 22–32)
Chloride: 106 mmol/L (ref 101–111)
Creatinine, Ser: 1.44 mg/dL — ABNORMAL HIGH (ref 0.44–1.00)
GFR calc Af Amer: 42 mL/min — ABNORMAL LOW (ref >60)
GFR, EST NON AFRICAN AMERICAN: 37 mL/min — AB (ref >60)
Glucose, Bld: 83 mg/dL (ref 65–99)
Potassium: 3.9 mmol/L (ref 3.5–5.1)
SODIUM: 141 mmol/L (ref 135–145)
Total Bilirubin: 0.8 mg/dL (ref 0.3–1.2)
Total Protein: 6.5 g/dL (ref 6.5–8.1)

## 2014-08-08 LAB — POCT INR: INR: 3

## 2014-08-08 LAB — CBC
HCT: 42.1 % (ref 36.0–46.0)
Hemoglobin: 13.4 g/dL (ref 12.0–15.0)
MCH: 29.7 pg (ref 26.0–34.0)
MCHC: 31.8 g/dL (ref 30.0–36.0)
MCV: 93.3 fL (ref 78.0–100.0)
Platelets: 289 10*3/uL (ref 150–400)
RBC: 4.51 MIL/uL (ref 3.87–5.11)
RDW: 14.5 % (ref 11.5–15.5)
WBC: 5.8 10*3/uL (ref 4.0–10.5)

## 2014-08-08 LAB — TSH: TSH: 2.697 u[IU]/mL (ref 0.350–4.500)

## 2014-08-08 NOTE — Telephone Encounter (Signed)
Spoke with pt and reminded pt of remote transmission that is due today. Pt verbalized understanding.   

## 2014-08-08 NOTE — Progress Notes (Signed)
Patient ID: [name], female   DOB: 1945/07/31, 69 y.o.   MRN: [id number] PCP: Webb Silversmith  69 yo with history of CAD, ischemic cardiomyopathy, and atrial fibrillation presents for cardiology followup.  Patient was hospitalized in 11/12 at [facility] for VT with ICD discharge.  She had a left heart cath showing patent stents.  EF was 35-40% by echo.  She is on amiodarone.  She has had periodic problems with creatinine rising with medication adjustments.  Repeat echo in 5/14 showed EF 40-45%.    She returns for follow up. She has lost 14 lbs.  Feeling good overall.  Some dyspnea with stairs but no problems walking on flat ground.  No orthopnea/PND.  No palpitations.  No melena/BRBPR.  She is in NSR today.      Labs (10/12): LDL 73, HDL 44 Labs (11/12): K 3.9, creatinine 1.1, LFTs normal, TSH normal Labs (12/12): K 3.9, creatinine 1.5, proBNP 18 Labs (1/13): LDL 82, HDL 57 Labs (2/13): K 4.3, creatinine 1.5 Labs (5/13): creatinine 2.4 => 1.7, LFTs normal, TSH normal, proBNP 18 Labs (6/13): K 4.2, creatinine 1.7 Labs (10/13): K 4.3, creatinine 1.4 Labs (4/14); LFTs normal, TSH normal, LDL 71, HDL 58 Labs (5/14): K 4.5, creatinine 1.4 Labs (8/14): TSH normal, LFTs normal Labs (9/14): K 4.2, creatinine 1.8, LDL 75, HDL 54 Labs (07/31/13) K 3.7 Creatinine 1.8 BNP 148  Labs (9/15): K 4.3, creatinine 1.24, LFTs normal, LDL 81, HDL 58, TSH normal  PMH: 1. Diabetes mellitus 2. CVA, TIA in 2010 3. HTN 4. CKD 5. H/o TAH 6. H/o CCY 7. Sciatica 8. Atrial fibrillation 9. CAD: s/p LAD and RCA PCI.  Last LHC in 11/12 with patent proximal LAD stent, ostial 70% D1 (jailed by stent), mild LAD stent patent, patent RCA stents, EF 40% with global hypokinesis.  10. Ischemic cardiomyopathy: Echo (10/12): EF 35-40%, moderate focal basal septal hypertrophy, inferior akinesis, grade I diastolic dysfunction, moderate aortic insufficiency. St Jude dual chamber ICD.  Spironolactone stopped when creatinine  rose to 2.5.  Echo (5/14) with EF 40-45%, mild AI.  11. Aortic insufficiency: moderate in past but mild on most recent echo.   12. 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  13. Gastroparesis  SH: Divorced.  3 children.  Quit smoking in 1997. Lives in [place].  [place].   FH: CAD  ROS: All systems reviewed and negative except as per HPI.   Current Outpatient Prescriptions  Medication Sig Dispense Refill  . albuterol (PROVENTIL HFA;VENTOLIN HFA) 108 (90 BASE) MCG/ACT inhaler Use 2 puffs twice daily for 4 days, then 2 puffs q2h PRN cough/wheezing 1 Inhaler 0  . amiodarone (PACERONE) 200 MG tablet TAKE 1 TABLET (200 MG TOTAL) BY MOUTH DAILY. 60 tablet 2  . Blood Glucose Monitoring Suppl (ACCU-CHEK NANO SMARTVIEW) W/DEVICE KIT Use to test blood sugar 4 times daily as instructed. Dx code: E11.59 1 kit 0  . carvedilol (COREG) 25 MG tablet TAKE 1 TABLET BY MOUTH TWICE A DAY WITH A MEAL 60 tablet 3  . cetirizine (ZYRTEC) 10 MG tablet Take 10 mg by mouth daily.    . Cholecalciferol (VITAMIN D3) 2000 UNITS TABS Take 2,000 Units by mouth daily.     . CRESTOR 20 MG tablet TAKE 1 TABLET BY MOUTH EVERY DAY 30 tablet 1  . doxycycline (VIBRA-TABS) 100 MG tablet Take 1 tablet (100 mg total) by mouth 2 (two) times daily. 14 tablet 0  . furosemide (LASIX) 20 MG tablet TAKE 1 TABLET (20  MG TOTAL) BY MOUTH DAILY. 30 tablet 3  . gabapentin (NEURONTIN) 400 MG capsule Take 1 capsule (400 mg total) by mouth 2 (two) times daily. (Patient taking differently: Take 400 mg by mouth 2 (two) times daily after a meal. ) 180 capsule 3  . glucose blood (ACCU-CHEK SMARTVIEW) test strip Use to test blood sugar 4 times daily as instructed. Dx code E11.59 200 each 11  . glycopyrrolate (ROBINUL) 1 MG tablet Take 1 tablet (1 mg total) by mouth 2 (two) times daily. 60 tablet 6  . Insulin Detemir (LEVEMIR FLEXTOUCH) 100 UNIT/ML Pen Inject under skin 20 units in am and 20 units at bedtime 45 mL 1  . insulin lispro (HUMALOG) 100 UNIT/ML  injection Inject 0.06-0.12 mLs (6-12 Units total) into the skin 3 (three) times daily before meals. 20 mL 2  . lisinopril (PRINIVIL,ZESTRIL) 5 MG tablet TAKE 1 TABLET BY MOUTH DAILY 30 tablet 6  . metFORMIN (GLUCOPHAGE-XR) 500 MG 24 hr tablet Take 1 tablet (500 mg total) by mouth 2 (two) times daily with a meal. 120 tablet 2  . montelukast (SINGULAIR) 10 MG tablet Take 1 tablet (10 mg total) by mouth daily. 30 tablet 11  . Multiple Vitamins-Minerals (EYE VITAMINS PO) Take 1 tablet by mouth daily.     . nizatidine (AXID) 150 MG capsule Take 1 capsule (150 mg total) by mouth daily. 90 capsule 3  . omeprazole (PRILOSEC) 20 MG capsule TAKE 1 CAPSULE BY MOUTH DAILY. 30 capsule 5  . rosuvastatin (CRESTOR) 20 MG tablet Take 20 mg by mouth daily.    . traMADol (ULTRAM) 50 MG tablet Take 1 tablet (50 mg total) by mouth every 6 (six) hours as needed. 120 tablet 1  . venlafaxine (EFFEXOR) 37.5 MG tablet TAKE 1 TABLET (37.5 MG TOTAL) BY MOUTH 2 (TWO) TIMES DAILY. 60 tablet 3  . Vitamin D, Ergocalciferol, (DRISDOL) 50000 UNITS CAPS capsule TAKE ONE CAPSULE BY MOUTH ONCE A WEEK 4 capsule 5  . warfarin (COUMADIN) 1 MG tablet Take as directed by Coumadin clinic 40 tablet 3  . nitroGLYCERIN (NITROSTAT) 0.4 MG SL tablet Place 1 tablet (0.4 mg total) under the tongue every 5 (five) minutes as needed for chest pain. 25 tablet 1   No current facility-administered medications for this encounter.    BP 120/68 mmHg  Pulse 61  Wt 197 lb 6.4 oz (89.54 kg)  SpO2 97% General: NAD, obese Neck: Thick, JVP 5-6 cm, no thyromegaly or thyroid nodule.  Lungs: Clear to auscultation bilaterally with normal respiratory effort. CV: Nondisplaced PMI.  Heart regular S1/S2, no S3/S4, 2/6 early SEM.  Trace ankle edema.  No carotid bruit.  Normal pedal pulses.  Abdomen: Obese, Soft, nontender, no hepatosplenomegaly, no distention.  Neurologic: Alert and oriented x 3.  Psych: Normal affect. Extremities: No clubbing or cyanosis.    Assessment/Plan 1. Atrial fibrillation  Paroxysmal, she is in NSR today. Continue amiodarone 200 mg daily. Continue warfarin. Has history of  CVA, needs Lovenox bridge if she has to stop warfarin at any point of time.  Needs yearly eye exams. Check TSH and LFTs today.  2. Coronary artery disease   No evidence of ischemia. Nonobstructive disease on last cath. As she is on warfarin also with stable CAD, she is not on aspirin. Continue statin, acceptable lipids in 9/15.  3. Ventricular tachycardia On amiodarone with suppression of VT.  4. Ischemic cardiomyopathy NYHA class II symptoms, stable.  She is not volume overloaded.  - Continue current dose  of lasix.   - Continue current doses of Coreg and lisinopril.  She is off spironolactone after episode of AKI. Would not titrate lisinopril today with CKD.  - I will get a repeat echo.  If EF remains low, would consider re-trying low dose of spironolactone.  5. CKD: Repeat BMET today.   Dalton McLean 08/08/2014   

## 2014-08-08 NOTE — Patient Instructions (Signed)
Labs today  Your physician recommends that you schedule a follow-up appointment in: 4 months  Do the following things EVERYDAY: 1) Weigh yourself in the morning before breakfast. Write it down and keep it in a log. 2) Take your medicines as prescribed 3) Eat low salt foods-Limit salt (sodium) to 2000 mg per day.  4) Stay as active as you can everyday 5) Limit all fluids for the day to less than 2 liters 6)   

## 2014-08-08 NOTE — Progress Notes (Signed)
Remote ICD transmission.   

## 2014-08-19 ENCOUNTER — Other Ambulatory Visit: Payer: Self-pay | Admitting: Gastroenterology

## 2014-08-19 ENCOUNTER — Other Ambulatory Visit: Payer: Self-pay | Admitting: Internal Medicine

## 2014-08-20 LAB — CUP PACEART REMOTE DEVICE CHECK
Battery Remaining Longevity: 4.7
Brady Statistic RV Percent Paced: 2.2 %
Date Time Interrogation Session: 20160530211243
HighPow Impedance: 47 Ohm
Lead Channel Impedance Value: 440 Ohm
Lead Channel Pacing Threshold Pulse Width: 0.4 ms
Lead Channel Sensing Intrinsic Amplitude: 4.3 mV
Lead Channel Setting Pacing Amplitude: 2 V
Lead Channel Setting Pacing Pulse Width: 0.5 ms
Lead Channel Setting Sensing Sensitivity: 0.5 mV
MDC IDC MSMT BATTERY REMAINING PERCENTAGE: 59 %
MDC IDC MSMT LEADCHNL RA PACING THRESHOLD AMPLITUDE: 0.5 V
MDC IDC MSMT LEADCHNL RV IMPEDANCE VALUE: 590 Ohm
MDC IDC MSMT LEADCHNL RV SENSING INTR AMPL: 11.8 mV
MDC IDC PG SERIAL: 819806
MDC IDC SET LEADCHNL RV PACING AMPLITUDE: 2.5 V
MDC IDC SET ZONE DETECTION INTERVAL: 340 ms
MDC IDC STAT BRADY RA PERCENT PACED: 92 %
Pulse Gen Model: 2241
Zone Setting Detection Interval: 270 ms

## 2014-08-21 NOTE — Telephone Encounter (Signed)
Please call and make a follow up office visit with Dr. Ardis Hughs for additional refills

## 2014-08-23 ENCOUNTER — Ambulatory Visit (HOSPITAL_COMMUNITY)
Admission: RE | Admit: 2014-08-23 | Discharge: 2014-08-23 | Disposition: A | Discharge status: Home / Self Care | Payer: Commercial Managed Care - HMO | Source: Ambulatory Visit | Attending: Cardiology | Admitting: Cardiology

## 2014-08-23 ENCOUNTER — Other Ambulatory Visit: Payer: Self-pay | Admitting: Cardiology

## 2014-08-23 ENCOUNTER — Other Ambulatory Visit (HOSPITAL_COMMUNITY): Payer: Self-pay | Admitting: Cardiology

## 2014-08-23 DIAGNOSIS — I509 Heart failure, unspecified: Secondary | ICD-10-CM

## 2014-08-23 DIAGNOSIS — I5021 Acute systolic (congestive) heart failure: Secondary | ICD-10-CM | POA: Insufficient documentation

## 2014-08-23 NOTE — Progress Notes (Signed)
  Echocardiogram 2D Echocardiogram has been performed.  [name] [name] 08/23/2014, 3:16 PM

## 2014-08-29 ENCOUNTER — Encounter: Payer: Self-pay | Admitting: Cardiology

## 2014-08-30 ENCOUNTER — Ambulatory Visit (INDEPENDENT_AMBULATORY_CARE_PROVIDER_SITE_OTHER): Payer: Commercial Managed Care - HMO | Admitting: *Deleted

## 2014-08-30 DIAGNOSIS — I4891 Unspecified atrial fibrillation: Secondary | ICD-10-CM

## 2014-08-30 DIAGNOSIS — Z7901 Long term (current) use of anticoagulants: Secondary | ICD-10-CM | POA: Diagnosis not present

## 2014-08-30 DIAGNOSIS — Z5181 Encounter for therapeutic drug level monitoring: Secondary | ICD-10-CM

## 2014-08-30 LAB — POCT INR: INR: 3.7

## 2014-09-03 ENCOUNTER — Encounter: Payer: Self-pay | Admitting: Internal Medicine

## 2014-09-10 ENCOUNTER — Ambulatory Visit (INDEPENDENT_AMBULATORY_CARE_PROVIDER_SITE_OTHER): Payer: Commercial Managed Care - HMO | Admitting: Family Medicine

## 2014-09-10 ENCOUNTER — Encounter: Payer: Self-pay | Admitting: Family Medicine

## 2014-09-10 VITALS — BP 128/84 | HR 81 | Ht 63.0 in | Wt 192.0 lb

## 2014-09-10 DIAGNOSIS — M25562 Pain in left knee: Secondary | ICD-10-CM | POA: Diagnosis not present

## 2014-09-10 DIAGNOSIS — M25561 Pain in right knee: Secondary | ICD-10-CM | POA: Diagnosis not present

## 2014-09-10 NOTE — Assessment & Plan Note (Signed)
Patient was given injections today. Patient does have braces that she is now wearing and she is not wearing proper shoewear. We discussed the importance of this as well as the topical anti-inflammatory and the over-the-counter natural supplementations. We discussed the importance of weight loss. Discuss that we may need viscous augmentation if patient does not last 3 months. I do believe the patient overall is doing relatively well overall her other comorbidities. Patient come back in 3-4 weeks.

## 2014-09-10 NOTE — Patient Instructions (Addendum)
Good to see you Ice is your friend Hopefully this will help the back OCntinue the vitamins Stay active Wear good shoes if you can  See me again in 4 weeks.

## 2014-09-10 NOTE — Progress Notes (Signed)
Pre visit review using our clinic review tool, if applicable. No additional management support is needed unless otherwise documented below in the visit note. 

## 2014-09-10 NOTE — Progress Notes (Signed)
[name] [address], [place] [address] Phone: [phone] Subjective:     CC: bilateral knee pain  [id number] [name] is a 69 y.o. female coming in with complaint of low back pain as well as bilateral knee pain.  Patient is being seen for left knee pain. Patient was seen previously and does have moderate to severe degenerative changes of the medial compartment as well as an MCL injury. Patient was given an injection back in February and was doing relatively well. Patient statesboth of her knees seem to be giving her more trouble. Patient does have known osteophytic changes. Patient states that the injection today help previously. This was allowing her to do her daily activities. Patient is finding it more more difficult to do such thing as going up and down stairs repetitively. States that even at night it can be sore. Nothing that is stopping her from activity but she started to change some of her daily activities when she can.  Patient's low back pain she has been seen by neurosurgery. Patient has been told that she does have arthritis. Patient has had epidural steroid injections. Patient states that these do not seem to help.Last imaging that I have at this time is a CT of the lumbar spine in 2013 showing multifactorial spinal stenosis most notably at L4-L5. Patient also has moderate to severe disc space narrowing at L1-L2 and L2-L3 without nerve impingement.  Continues to be painful and when her knees hurt her back seems to hurt more as well.      Past medical history, social, surgical and family history all reviewed in electronic medical record.   Review of Systems: No headache, visual changes, nausea, vomiting, diarrhea, constipation, dizziness, abdominal pain, skin rash, fevers, chills, night sweats, weight loss, swollen lymph nodes, body aches, joint swelling, muscle aches, chest pain, shortness of breath, mood changes.    Objective Blood pressure 128/84, pulse 81, height 5\' 3"  (1.6 m), weight 192 lb (87.091 kg), SpO2 94 %.  General: No apparent distress alert and oriented x3 mood and affect normal, dressed appropriately. Overweight HEENT: Pupils equal, extraocular movements intact  Respiratory: Patient's speak in full sentences and does not appear short of breath  Cardiovascular: No lower extremity edema, non tender, no erythema  Skin: Warm dry intact with no signs of infection or rash on extremities or on axial skeleton.  Abdomen: Soft nontender  Neuro: Cranial nerves II through XII are intact, neurovascularly intact in all extremities with 2+ DTRs and 2+ pulses.  Lymph: No lymphadenopathy of posterior or anterior cervical chain or axillae bilaterally.  Gait normal with good balance and coordination.  MSK:  Non tender with full range of motion and good stability and symmetric strength and tone of shoulders, elbows, wrist, hip, and ankles bilaterally.  Knee: Bilateral Normal to inspection with no erythema or effusion or obvious bony abnormalities. Worsening tenderness over the medial joint line bilaterally ROM full in flexion and extension and lower leg rotation. Ligaments with solid consistent endpoints including ACL, PCL, LCL, MCL. Negative Mcmurray's, Apley's, and Thessalonian tests. painful patellar compression Moderate crepitus noted on range of motion Patellar and quadriceps tendons unremarkable. Hamstring and quadriceps strength is normal.    Back Exam:  Inspection: Unremarkable  Motion: Flexion 30 deg, Extension 35 deg, Side Bending to 35 deg bilaterally,  Rotation to 35 deg bilaterally  SLR laying: Negative  XSLR laying: Negative  Palpable tenderness: None. FABER: Positive right still present but  improving Sensory change: Gross sensation intact to all lumbar and sacral dermatomes.  Reflexes: 2+ at both patellar tendons, 2+ at achilles tendons, Babinski's downgoing.  Strength at foot   Plantar-flexion: 5/5 Dorsi-flexion: 5/5 Eversion: 5/5 Inversion: 5/5  Leg strength  Quad: 5/5 Hamstring: 5/5 Hip flexor: 5/5 Hip abductors: 3+/5  Gait unremarkable. No significant change from previous exam   After informed written and verbal consent, patient was seated on exam table. Right knee was prepped with alcohol swab and utilizing anterolateral approach, patient's right knee space was injected with 4:1  marcaine 0.5%: Kenalog 40mg /dL. Patient tolerated the procedure well without immediate complications.  After informed written and verbal consent, patient was seated on exam table. Left knee was prepped with alcohol swab and utilizing anterolateral approach, patient's left knee space was injected with 4:1  marcaine 0.5%: Kenalog 40mg /dL. Patient tolerated the procedure well without immediate complications.   Impression and Recommendations:     This case required medical decision making of moderate complexity.

## 2014-09-12 ENCOUNTER — Encounter: Payer: Self-pay | Admitting: Cardiology

## 2014-09-17 ENCOUNTER — Other Ambulatory Visit: Payer: Self-pay

## 2014-09-18 ENCOUNTER — Other Ambulatory Visit: Payer: Self-pay | Admitting: Internal Medicine

## 2014-09-20 ENCOUNTER — Ambulatory Visit (INDEPENDENT_AMBULATORY_CARE_PROVIDER_SITE_OTHER): Payer: Commercial Managed Care - HMO | Admitting: *Deleted

## 2014-09-20 DIAGNOSIS — I4891 Unspecified atrial fibrillation: Secondary | ICD-10-CM

## 2014-09-20 DIAGNOSIS — Z7901 Long term (current) use of anticoagulants: Secondary | ICD-10-CM

## 2014-09-20 DIAGNOSIS — Z5181 Encounter for therapeutic drug level monitoring: Secondary | ICD-10-CM

## 2014-09-20 LAB — POCT INR: INR: 1.7

## 2014-09-25 ENCOUNTER — Other Ambulatory Visit: Payer: Self-pay | Admitting: *Deleted

## 2014-09-25 MED ORDER — METFORMIN HCL ER 500 MG PO TB24: 500.0000 mg | ORAL_TABLET | Freq: Two times a day (BID) | ORAL | Status: DC

## 2014-10-02 ENCOUNTER — Other Ambulatory Visit: Payer: Self-pay | Admitting: Cardiology

## 2014-10-03 ENCOUNTER — Encounter: Payer: Self-pay | Admitting: *Deleted

## 2014-10-04 ENCOUNTER — Ambulatory Visit (INDEPENDENT_AMBULATORY_CARE_PROVIDER_SITE_OTHER): Payer: Commercial Managed Care - HMO | Admitting: *Deleted

## 2014-10-04 DIAGNOSIS — Z7901 Long term (current) use of anticoagulants: Secondary | ICD-10-CM | POA: Diagnosis not present

## 2014-10-04 DIAGNOSIS — Z5181 Encounter for therapeutic drug level monitoring: Secondary | ICD-10-CM

## 2014-10-04 DIAGNOSIS — I4891 Unspecified atrial fibrillation: Secondary | ICD-10-CM

## 2014-10-04 LAB — POCT INR: INR: 1.6

## 2014-10-09 ENCOUNTER — Other Ambulatory Visit: Payer: Self-pay | Admitting: Internal Medicine

## 2014-10-18 ENCOUNTER — Ambulatory Visit (INDEPENDENT_AMBULATORY_CARE_PROVIDER_SITE_OTHER): Payer: Commercial Managed Care - HMO

## 2014-10-18 ENCOUNTER — Ambulatory Visit: Payer: Commercial Managed Care - HMO | Admitting: Internal Medicine

## 2014-10-18 DIAGNOSIS — Z7901 Long term (current) use of anticoagulants: Secondary | ICD-10-CM | POA: Diagnosis not present

## 2014-10-18 DIAGNOSIS — Z5181 Encounter for therapeutic drug level monitoring: Secondary | ICD-10-CM | POA: Diagnosis not present

## 2014-10-18 DIAGNOSIS — I4891 Unspecified atrial fibrillation: Secondary | ICD-10-CM | POA: Diagnosis not present

## 2014-10-18 LAB — POCT INR: INR: 1.9

## 2014-10-28 ENCOUNTER — Other Ambulatory Visit: Payer: Self-pay | Admitting: Internal Medicine

## 2014-10-28 ENCOUNTER — Other Ambulatory Visit: Payer: Self-pay | Admitting: Family Medicine

## 2014-10-29 ENCOUNTER — Other Ambulatory Visit: Payer: Self-pay

## 2014-10-29 MED ORDER — VITAMIN D (ERGOCALCIFEROL) 1.25 MG (50000 UNIT) PO CAPS: 50000.0000 [IU] | ORAL_CAPSULE | ORAL | Status: DC

## 2014-10-29 NOTE — Telephone Encounter (Signed)
Refill done.  

## 2014-11-01 ENCOUNTER — Ambulatory Visit (INDEPENDENT_AMBULATORY_CARE_PROVIDER_SITE_OTHER): Payer: Commercial Managed Care - HMO | Admitting: *Deleted

## 2014-11-01 DIAGNOSIS — Z7901 Long term (current) use of anticoagulants: Secondary | ICD-10-CM | POA: Diagnosis not present

## 2014-11-01 DIAGNOSIS — I4891 Unspecified atrial fibrillation: Secondary | ICD-10-CM

## 2014-11-01 DIAGNOSIS — Z5181 Encounter for therapeutic drug level monitoring: Secondary | ICD-10-CM

## 2014-11-01 LAB — POCT INR: INR: 2.4

## 2014-11-10 ENCOUNTER — Other Ambulatory Visit (HOSPITAL_COMMUNITY): Payer: Self-pay | Admitting: Internal Medicine

## 2014-11-12 ENCOUNTER — Encounter: Payer: Self-pay | Admitting: *Deleted

## 2014-11-12 ENCOUNTER — Encounter: Payer: Self-pay | Admitting: Internal Medicine

## 2014-11-12 ENCOUNTER — Ambulatory Visit (INDEPENDENT_AMBULATORY_CARE_PROVIDER_SITE_OTHER): Payer: Commercial Managed Care - HMO | Admitting: Internal Medicine

## 2014-11-12 VITALS — BP 112/62 | HR 66 | Ht 63.0 in | Wt 188.6 lb

## 2014-11-12 DIAGNOSIS — I472 Ventricular tachycardia: Secondary | ICD-10-CM

## 2014-11-12 DIAGNOSIS — I255 Ischemic cardiomyopathy: Secondary | ICD-10-CM | POA: Diagnosis not present

## 2014-11-12 DIAGNOSIS — I4729 Other ventricular tachycardia: Secondary | ICD-10-CM

## 2014-11-12 DIAGNOSIS — I481 Persistent atrial fibrillation: Secondary | ICD-10-CM | POA: Diagnosis not present

## 2014-11-12 DIAGNOSIS — Z95810 Presence of automatic (implantable) cardiac defibrillator: Secondary | ICD-10-CM

## 2014-11-12 DIAGNOSIS — I4819 Other persistent atrial fibrillation: Secondary | ICD-10-CM

## 2014-11-12 DIAGNOSIS — Z006 Encounter for examination for normal comparison and control in clinical research program: Secondary | ICD-10-CM

## 2014-11-12 LAB — CUP PACEART INCLINIC DEVICE CHECK
HIGH POWER IMPEDANCE MEASURED VALUE: 49.1944
Lead Channel Impedance Value: 712.5 Ohm
Lead Channel Pacing Threshold Amplitude: 0.75 V
Lead Channel Pacing Threshold Amplitude: 1 V
Lead Channel Pacing Threshold Pulse Width: 0.5 ms
Lead Channel Sensing Intrinsic Amplitude: 11.8 mV
Lead Channel Setting Pacing Amplitude: 2 V
Lead Channel Setting Pacing Amplitude: 2.5 V
Lead Channel Setting Sensing Sensitivity: 0.5 mV
MDC IDC MSMT BATTERY REMAINING LONGEVITY: 56.4 mo
MDC IDC MSMT LEADCHNL RA IMPEDANCE VALUE: 425 Ohm
MDC IDC MSMT LEADCHNL RA SENSING INTR AMPL: 3.7 mV
MDC IDC MSMT LEADCHNL RV PACING THRESHOLD PULSEWIDTH: 0.5 ms
MDC IDC SESS DTM: 20160822175841
MDC IDC SET LEADCHNL RV PACING PULSEWIDTH: 0.5 ms
MDC IDC SET ZONE DETECTION INTERVAL: 340 ms
MDC IDC STAT BRADY RA PERCENT PACED: 94 %
MDC IDC STAT BRADY RV PERCENT PACED: 2.2 %
Pulse Gen Model: 2241
Pulse Gen Serial Number: 819806
Zone Setting Detection Interval: 270 ms

## 2014-11-12 NOTE — Patient Instructions (Signed)
Medication Instructions:  Your physician recommends that you continue on your current medications as directed. Please refer to the Current Medication list given to you today.   Labwork: None ordered  Testing/Procedures: None ordered  Follow-Up: Your physician wants you to follow-up in: 12 months with Chanetta Marshall, NP You will receive a reminder letter in the mail two months in advance. If you don't receive a letter, please call our office to schedule the follow-up appointment.   Remote monitoring is used to monitor your  ICD from home. This monitoring reduces the number of office visits required to check your device to one time per year. It allows [name] to keep an eye on the functioning of your device to ensure it is working properly. You are scheduled for a device check from home on 02/11/15. You may send your transmission at any time that day. If you have a wireless device, the transmission will be sent automatically. After your physician reviews your transmission, you will receive a postcard with your next transmission date.    Any Other Special Instructions Will Be Listed Below (If Applicable).

## 2014-11-12 NOTE — Progress Notes (Signed)
Analyze ST Final Research Visit- Pt seen with Dr. Rayann Heman. Device checked by industry and research. No ST alerts. Research ST thresholds turned off per protocol. Pt released from research study [place] and transferred to [facility] for ongoing remote monitoring. EKG WNL reviewed by Dr. Rayann Heman. BP 112/62 HR 66.

## 2014-11-12 NOTE — Progress Notes (Signed)
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 female who presents today for routine electrophysiology followup.  Since last being seen in our clinic, the patient reports doing very well.   Today, she denies symptoms of palpitations, chest pain, shortness of breath,  lower extremity edema, dizziness, presyncope, syncope, or ICD shocks.  The patient is otherwise without complaint today.   Past Medical History  Diagnosis Date  . Chronic systolic heart failure   . Diabetes mellitus     type 1  . Hypertension   . Stroke 2010    eye doctor said she had TIA  . Ischemic cardiomyopathy   . Ventricular tachycardia     Polymorphic  . 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   . Atrial fibrillation     controlled with amiodarone, on coumadin  . Coronary artery disease 01/31/2011  . Chronic renal insufficiency   . Lumbar spondylosis 01/11/2012  . History of chicken pox   . Allergy   . Hyperlipidemia   . Arthritis   . Congestive heart failure    Past Surgical History  Procedure Laterality Date  . Abdominal hysterectomy  1995  . Cholecystectomy  1985  . Tubaligation  1980  . Icd  2003/2007    implanted by Dr Rollene Fare, most recent generator change 2/13 by Dr Rayann Heman, Analyze ST study patient  . Cardiac defibrillator placement    . Left heart catheterization with coronary angiogram N/A 01/30/2011    Procedure: LEFT HEART CATHETERIZATION WITH CORONARY ANGIOGRAM;  Surgeon: Larey Dresser, MD;  Location[facility] LAB;  Service: Cardiovascular;  Laterality: N/A;  . Implantable cardioverter defibrillator (icd) generator change N/A 05/05/2011    Procedure: ICD GENERATOR CHANGE;  Surgeon: Thompson Grayer, MD;  Location: [facility] CATH LAB;  Service: Cardiovascular;  Laterality: N/A;    Current Outpatient Prescriptions  Medication Sig Dispense Refill  . amiodarone (PACERONE) 200 MG tablet TAKE 1 TABLET (200 MG TOTAL) BY MOUTH DAILY. 60 tablet 2  . Blood Glucose  Monitoring Suppl (ACCU-CHEK NANO SMARTVIEW) W/DEVICE KIT Use to test blood sugar 4 times daily as instructed. Dx code: E11.59 1 kit 0  . carvedilol (COREG) 25 MG tablet TAKE 1 TABLET BY MOUTH TWICE A DAY WITH A MEAL 60 tablet 3  . Cholecalciferol (VITAMIN D3) 2000 UNITS TABS Take 2,000 Units by mouth daily.     . CRESTOR 20 MG tablet TAKE 1 TABLET BY MOUTH EVERY DAY 30 tablet 1  . furosemide (LASIX) 20 MG tablet TAKE 1 TABLET (20 MG TOTAL) BY MOUTH DAILY. 30 tablet 3  . gabapentin (NEURONTIN) 400 MG capsule Take 1 capsule (400 mg total) by mouth 2 (two) times daily. (Patient taking differently: Take 400 mg by mouth 2 (two) times daily after a meal. ) 180 capsule 3  . glucose blood (ACCU-CHEK SMARTVIEW) test strip Use to test blood sugar 4 times daily as instructed. Dx code E11.59 200 each 11  . glycopyrrolate (ROBINUL) 1 MG tablet TAKE 1 TABLET (1 MG TOTAL) BY MOUTH 2 (TWO) TIMES DAILY. 60 tablet 2  . Insulin Detemir (LEVEMIR FLEXTOUCH) 100 UNIT/ML Pen Inject under skin 20 units in am and 20 units at bedtime 45 mL 1  . insulin lispro (HUMALOG) 100 UNIT/ML injection Inject 0.06-0.12 mLs (6-12 Units total) into the skin 3 (three) times daily before meals. 20 mL 2  . lisinopril (PRINIVIL,ZESTRIL) 5 MG tablet TAKE 1 TABLET BY MOUTH DAILY 30 tablet 6  . metFORMIN (  GLUCOPHAGE-XR) 500 MG 24 hr tablet Take 1 tablet (500 mg total) by mouth 2 (two) times daily with a meal. 120 tablet 2  . montelukast (SINGULAIR) 10 MG tablet Take 1 tablet (10 mg total) by mouth daily. 30 tablet 11  . Multiple Vitamins-Minerals (EYE VITAMINS PO) Take 1 tablet by mouth daily.     . nitroGLYCERIN (NITROSTAT) 0.4 MG SL tablet Place 1 tablet (0.4 mg total) under the tongue every 5 (five) minutes as needed for chest pain. 25 tablet 1  . nizatidine (AXID) 150 MG capsule Take 1 capsule (150 mg total) by mouth daily. 90 capsule 3  . rosuvastatin (CRESTOR) 20 MG tablet Take 20 mg by mouth daily.    . traMADol (ULTRAM) 50 MG tablet Take  1 tablet (50 mg total) by mouth every 6 (six) hours as needed. (Patient taking differently: Take 50 mg by mouth every 6 (six) hours as needed (pain). ) 120 tablet 1  . venlafaxine (EFFEXOR) 37.5 MG tablet TAKE 1 TABLET (37.5 MG TOTAL) BY MOUTH 2 (TWO) TIMES DAILY. 60 tablet 3  . Vitamin D, Ergocalciferol, (DRISDOL) 50000 UNITS CAPS capsule Take 1 capsule (50,000 Units total) by mouth once a week. 4 capsule 5  . warfarin (COUMADIN) 1 MG tablet TAKE AS DIRECTED BY COUMADIN CLINIC 40 tablet 3   No current facility-administered medications for this visit.   ROS- all systems are reviewed and negative except as per HPI above  Physical Exam: Filed Vitals:   11/12/14 1404  BP: 112/62  Pulse: 66  Height: $Remove'5\' 3"'XLgACAV$  (1.6 m)  Weight: 85.548 kg (188 lb 9.6 oz)    GEN- The patient is well appearing, alert and oriented x 3 today.   Head- normocephalic, atraumatic Eyes-  Sclera clear, conjunctiva pink Ears- hearing intact Oropharynx- clear Lungs- Clear to ausculation bilaterally, normal work of breathing Chest- ICD pocket is well healed Heart- Regular rate and rhythm, no murmurs, rubs or gallops, PMI not laterally displaced GI- soft, NT, ND, + BS Extremities- no clubbing, cyanosis, or edema  ICD interrogation- reviewed in detail today,  See PACEART report  Assessment and Plan:  1.  Chronic systolic dysfunction euvolemic today Stable on an appropriate medical regimen Normal ICD function See Pace Art report No changes today  2. VT Controlled with amiodarone Labs 5/16 are reviewed  3. AF Controlled Continue long term anticoagulation  chads2vasc score is 8 Continue life long anticoagulation  Follow-up with Dr Aundra Dubin as scheduled Delilah Shan EP NP to see in 1 year

## 2014-11-20 ENCOUNTER — Ambulatory Visit: Payer: Commercial Managed Care - HMO | Admitting: Internal Medicine

## 2014-11-27 ENCOUNTER — Other Ambulatory Visit: Payer: Self-pay | Admitting: Internal Medicine

## 2014-11-29 ENCOUNTER — Other Ambulatory Visit: Payer: Self-pay | Admitting: Internal Medicine

## 2014-11-29 ENCOUNTER — Other Ambulatory Visit: Payer: Self-pay | Admitting: Gastroenterology

## 2014-12-12 ENCOUNTER — Ambulatory Visit: Payer: Commercial Managed Care - HMO | Admitting: Internal Medicine

## 2014-12-12 ENCOUNTER — Telehealth: Payer: Self-pay

## 2014-12-12 NOTE — Telephone Encounter (Signed)
Outreach for AWV reviewed and noted the patient had apt today at 2:15 but canceled. Call placed to the home number and Left a message for call back.  Will educate on AWV as well as review for mammogram.

## 2014-12-12 NOTE — Telephone Encounter (Signed)
Incoming call back from the patient; Educated on AWV and stated she can stay post apt with Dr. Jenny Reichmann rescheduled for 10 on 9/30/ will block time from 10:15 to 11:30 to cover for delays;

## 2014-12-21 ENCOUNTER — Encounter: Payer: Self-pay | Admitting: Internal Medicine

## 2014-12-21 ENCOUNTER — Ambulatory Visit (INDEPENDENT_AMBULATORY_CARE_PROVIDER_SITE_OTHER): Payer: Commercial Managed Care - HMO | Admitting: Internal Medicine

## 2014-12-21 ENCOUNTER — Other Ambulatory Visit (INDEPENDENT_AMBULATORY_CARE_PROVIDER_SITE_OTHER): Payer: Commercial Managed Care - HMO

## 2014-12-21 VITALS — BP 110/70 | HR 66 | Temp 98.5°F | Ht 63.0 in | Wt 186.0 lb

## 2014-12-21 DIAGNOSIS — E1151 Type 2 diabetes mellitus with diabetic peripheral angiopathy without gangrene: Secondary | ICD-10-CM

## 2014-12-21 DIAGNOSIS — IMO0002 Reserved for concepts with insufficient information to code with codable children: Secondary | ICD-10-CM

## 2014-12-21 DIAGNOSIS — E1159 Type 2 diabetes mellitus with other circulatory complications: Secondary | ICD-10-CM

## 2014-12-21 DIAGNOSIS — Z1211 Encounter for screening for malignant neoplasm of colon: Secondary | ICD-10-CM | POA: Insufficient documentation

## 2014-12-21 DIAGNOSIS — Z23 Encounter for immunization: Secondary | ICD-10-CM

## 2014-12-21 DIAGNOSIS — I255 Ischemic cardiomyopathy: Secondary | ICD-10-CM

## 2014-12-21 DIAGNOSIS — E1165 Type 2 diabetes mellitus with hyperglycemia: Principal | ICD-10-CM

## 2014-12-21 DIAGNOSIS — M8580 Other specified disorders of bone density and structure, unspecified site: Secondary | ICD-10-CM

## 2014-12-21 DIAGNOSIS — Z0000 Encounter for general adult medical examination without abnormal findings: Secondary | ICD-10-CM

## 2014-12-21 DIAGNOSIS — M545 Low back pain, unspecified: Secondary | ICD-10-CM

## 2014-12-21 DIAGNOSIS — Z95810 Presence of automatic (implantable) cardiac defibrillator: Secondary | ICD-10-CM

## 2014-12-21 LAB — MICROALBUMIN / CREATININE URINE RATIO
CREATININE, U: 133.5 mg/dL
MICROALB/CREAT RATIO: 1.6 mg/g (ref 0.0–30.0)
Microalb, Ur: 2.1 mg/dL — ABNORMAL HIGH (ref 0.0–1.9)

## 2014-12-21 LAB — CBC WITH DIFFERENTIAL/PLATELET
BASOS ABS: 0 10*3/uL (ref 0.0–0.1)
BASOS PCT: 0.3 % (ref 0.0–3.0)
EOS ABS: 0.1 10*3/uL (ref 0.0–0.7)
Eosinophils Relative: 0.9 % (ref 0.0–5.0)
HCT: 38 % (ref 36.0–46.0)
HEMOGLOBIN: 12.7 g/dL (ref 12.0–15.0)
LYMPHS PCT: 15.2 % (ref 12.0–46.0)
Lymphs Abs: 1.1 10*3/uL (ref 0.7–4.0)
MCHC: 33.3 g/dL (ref 30.0–36.0)
MCV: 91.1 fl (ref 78.0–100.0)
MONO ABS: 0.5 10*3/uL (ref 0.1–1.0)
Monocytes Relative: 7.4 % (ref 3.0–12.0)
Neutro Abs: 5.6 10*3/uL (ref 1.4–7.7)
Neutrophils Relative %: 76.2 % (ref 43.0–77.0)
Platelets: 202 10*3/uL (ref 150.0–400.0)
RBC: 4.18 Mil/uL (ref 3.87–5.11)
RDW: 16 % — AB (ref 11.5–15.5)
WBC: 7.4 10*3/uL (ref 4.0–10.5)

## 2014-12-21 LAB — URINALYSIS, ROUTINE W REFLEX MICROSCOPIC
BILIRUBIN URINE: NEGATIVE
Hgb urine dipstick: NEGATIVE
Ketones, ur: NEGATIVE
Leukocytes, UA: NEGATIVE
NITRITE: NEGATIVE
PH: 6 (ref 5.0–8.0)
RBC / HPF: NONE SEEN (ref 0–?)
Specific Gravity, Urine: 1.02 (ref 1.000–1.030)
TOTAL PROTEIN, URINE-UPE24: NEGATIVE
URINE GLUCOSE: NEGATIVE
UROBILINOGEN UA: 1 (ref 0.0–1.0)

## 2014-12-21 LAB — HEPATIC FUNCTION PANEL
ALBUMIN: 3.7 g/dL (ref 3.5–5.2)
ALT: 16 U/L (ref 0–35)
AST: 15 U/L (ref 0–37)
Alkaline Phosphatase: 52 U/L (ref 39–117)
Bilirubin, Direct: 0.1 mg/dL (ref 0.0–0.3)
TOTAL PROTEIN: 7 g/dL (ref 6.0–8.3)
Total Bilirubin: 0.6 mg/dL (ref 0.2–1.2)

## 2014-12-21 LAB — BASIC METABOLIC PANEL
BUN: 34 mg/dL — AB (ref 6–23)
CHLORIDE: 105 meq/L (ref 96–112)
CO2: 29 meq/L (ref 19–32)
CREATININE: 1.44 mg/dL — AB (ref 0.40–1.20)
Calcium: 9.3 mg/dL (ref 8.4–10.5)
GFR: 38.49 mL/min — ABNORMAL LOW (ref >60.00)
GLUCOSE: 123 mg/dL — AB (ref 70–99)
POTASSIUM: 4.4 meq/L (ref 3.5–5.1)
Sodium: 143 mEq/L (ref 135–145)

## 2014-12-21 LAB — HEMOGLOBIN A1C: Hgb A1c MFr Bld: 6.5 % (ref 4.6–6.5)

## 2014-12-21 LAB — LIPID PANEL
CHOLESTEROL: 135 mg/dL (ref 0–200)
HDL: 54.2 mg/dL (ref >39.00)
LDL Cholesterol: 64 mg/dL (ref 0–99)
NonHDL: 80.98
TRIGLYCERIDES: 87 mg/dL (ref 0.0–149.0)
Total CHOL/HDL Ratio: 2
VLDL: 17.4 mg/dL (ref 0.0–40.0)

## 2014-12-21 LAB — TSH: TSH: 3.29 u[IU]/mL (ref 0.35–4.50)

## 2014-12-21 MED ORDER — ROSUVASTATIN CALCIUM 20 MG PO TABS: 20.0000 mg | ORAL_TABLET | Freq: Every day | ORAL | Status: DC

## 2014-12-21 NOTE — Patient Instructions (Addendum)
You had the flu shot today  Please schedule the bone density test before leaving today at the scheduling desk (where you check out)  Please continue all other medications as before, and refills have been done if requested.  Please have the pharmacy call with any other refills you may need.  Please continue your efforts at being more active, low cholesterol diet, and weight control.  You are otherwise up to date with prevention measures today.  Please keep your appointments with your specialists as you may have planned  Please go to the LAB in the Basement (turn left off the elevator) for the tests to be done today  You will be contacted by phone if any changes need to be made immediately.  Otherwise, you will receive a letter about your results with an explanation, but please check with MyChart first.  Please remember to sign up for MyChart if you have not done so, as this will be important to you in the future with finding out test results, communicating by private email, and scheduling acute appointments online when needed.  Please return in 1 year for your yearly visit, or sooner if needed, with Lab testing done 3-5 days before    [name] , Thank you for taking time to come for your Medicare Wellness Visit. I appreciate your ongoing commitment to your health goals. Please review the following plan we discussed and let me know if I can assist you in the future.   Has lost weight and goal is to continue to lose Continue with self breast exam  These are the goals we discussed: Goals    None      This is a list of the screening recommended for you and due dates:  Health Maintenance  Topic Date Due  .  Hepatitis C: One time screening is recommended by Center for Disease Control  (CDC) for  adults born from [birth year] through 1964.   [birth year]  . Mammogram  02/03/1997  . Shingles Vaccine  02/04/2007  . DEXA scan (bone density measurement)  02/04/2012  . Pneumonia vaccines (2 of  2 - PCV13) 08/30/2013  . Urine Protein Check  12/15/2013  . Flu Shot  10/22/2014  . Eye exam for diabetics  12/09/2014  . Hemoglobin A1C  01/18/2015  . Complete foot exam   07/19/2015  . Tetanus Vaccine  03/23/2016  . Colon Cancer Screening  03/23/2017

## 2014-12-21 NOTE — Progress Notes (Signed)
Subjective:    Patient ID: [name], female    DOB: 1946-01-17, 69 y.o.   MRN: [id number]  HPI  Here for wellness and f/u;  Overall doing ok;  Pt denies Chest pain, worsening SOB, DOE, wheezing, orthopnea, PND, worsening LE edema, palpitations, dizziness or syncope.  Pt denies neurological change such as new headache, facial or extremity weakness.  Pt denies polydipsia, polyuria, or low sugar symptoms. Pt states overall good compliance with treatment and medications, good tolerability, and has been trying to follow appropriate diet.  Pt denies worsening depressive symptoms, suicidal ideation or panic. No fever, night sweats, wt loss, loss of appetite, or other constitutional symptoms.  Pt states good ability with ADL's, has low fall risk, home safety reviewed and adequate, no other significant changes in hearing or vision, and only occasionally active with exercise. No current complaints Past Medical History  Diagnosis Date  . Chronic systolic heart failure   . Diabetes mellitus     type 1  . Hypertension   . Stroke 2010    eye doctor said she had TIA  . Ischemic cardiomyopathy   . Ventricular tachycardia     Polymorphic  . 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   . Atrial fibrillation     controlled with amiodarone, on coumadin  . Coronary artery disease 01/31/2011  . Chronic renal insufficiency   . Lumbar spondylosis 01/11/2012  . History of chicken pox   . Allergy   . Hyperlipidemia   . Arthritis   . Congestive heart failure    Past Surgical History  Procedure Laterality Date  . Abdominal hysterectomy  1995  . Cholecystectomy  1985  . Tubaligation  1980  . Icd  2003/2007    implanted by Dr Rollene Fare, most recent generator change 2/13 by Dr Rayann Heman, Analyze ST study patient  . Cardiac defibrillator placement    . Left heart catheterization with coronary angiogram N/A 01/30/2011    Procedure: LEFT HEART CATHETERIZATION WITH CORONARY ANGIOGRAM;  Surgeon: Larey Dresser, MD;  Location: [facility] CATH LAB;  Service: Cardiovascular;  Laterality: N/A;  . Implantable cardioverter defibrillator (icd) generator change N/A 05/05/2011    Procedure: ICD GENERATOR CHANGE;  Surgeon: Thompson Grayer, MD;  Location: [facility] CATH LAB;  Service: Cardiovascular;  Laterality: N/A;    reports that she quit smoking about 19 years ago. She has never used smokeless tobacco. She reports that she does not drink alcohol or use illicit drugs. family history includes Breast cancer in her sister; Colon cancer in her maternal aunt; Diabetes in her mother; Early death (age of onset: 53) in her brother; Heart attack in her father; Heart disease in her father and mother; Hyperlipidemia in her mother; Hypertension in her father and mother; Irritable bowel syndrome in her sister; Lung cancer in her sister. There is no history of Esophageal cancer or Colon polyps. Allergies  Allergen Reactions  . Darvon Other (See Comments)    indigestion  . Promethazine Hcl Other (See Comments)    hyperactivity  . Plavix [Clopidogrel Bisulfate] Rash   Current Outpatient Prescriptions on File Prior to Visit  Medication Sig Dispense Refill  . amiodarone (PACERONE) 200 MG tablet TAKE 1 TABLET (200 MG TOTAL) BY MOUTH DAILY. 60 tablet 2  . Blood Glucose Monitoring Suppl (ACCU-CHEK NANO SMARTVIEW) W/DEVICE KIT Use to test blood sugar 4 times daily as instructed. Dx code: E11.59 1 kit 0  . carvedilol (COREG) 25 MG tablet TAKE 1 TABLET BY MOUTH TWICE A  DAY WITH A MEAL 60 tablet 3  . Cholecalciferol (VITAMIN D3) 2000 UNITS TABS Take 2,000 Units by mouth daily.     . furosemide (LASIX) 20 MG tablet TAKE 1 TABLET (20 MG TOTAL) BY MOUTH DAILY. 30 tablet 3  . gabapentin (NEURONTIN) 400 MG capsule TAKE 1 CAPSULE (400 MG TOTAL) BY MOUTH 2 (TWO) TIMES DAILY. 180 capsule 0  . glucose blood (ACCU-CHEK SMARTVIEW) test strip Use to test blood sugar 4 times daily as instructed. Dx code E11.59 200 each 11  . glycopyrrolate (ROBINUL) 1  MG tablet TAKE 1 TABLET (1 MG TOTAL) BY MOUTH 2 (TWO) TIMES DAILY. 60 tablet 2  . Insulin Detemir (LEVEMIR FLEXTOUCH) 100 UNIT/ML Pen Inject under skin 20 units in am and 20 units at bedtime 45 mL 1  . insulin lispro (HUMALOG) 100 UNIT/ML injection Inject 0.06-0.12 mLs (6-12 Units total) into the skin 3 (three) times daily before meals. 20 mL 2  . lisinopril (PRINIVIL,ZESTRIL) 5 MG tablet TAKE 1 TABLET BY MOUTH DAILY 30 tablet 6  . metFORMIN (GLUCOPHAGE-XR) 500 MG 24 hr tablet Take 1 tablet (500 mg total) by mouth 2 (two) times daily with a meal. 120 tablet 2  . montelukast (SINGULAIR) 10 MG tablet Take 1 tablet (10 mg total) by mouth daily. 30 tablet 11  . Multiple Vitamins-Minerals (EYE VITAMINS PO) Take 1 tablet by mouth daily.     . nitroGLYCERIN (NITROSTAT) 0.4 MG SL tablet Place 1 tablet (0.4 mg total) under the tongue every 5 (five) minutes as needed for chest pain. 25 tablet 1  . nizatidine (AXID) 150 MG capsule Take 1 capsule (150 mg total) by mouth daily. 90 capsule 3  . omeprazole (PRILOSEC) 20 MG capsule TAKE 1 CAPSULE BY MOUTH DAILY. 30 capsule 5  . traMADol (ULTRAM) 50 MG tablet Take 1 tablet (50 mg total) by mouth every 6 (six) hours as needed. (Patient taking differently: Take 50 mg by mouth every 6 (six) hours as needed (pain). ) 120 tablet 1  . venlafaxine (EFFEXOR) 37.5 MG tablet TAKE 1 TABLET (37.5 MG TOTAL) BY MOUTH 2 (TWO) TIMES DAILY. 60 tablet 3  . Vitamin D, Ergocalciferol, (DRISDOL) 50000 UNITS CAPS capsule Take 1 capsule (50,000 Units total) by mouth once a week. 4 capsule 5  . warfarin (COUMADIN) 1 MG tablet TAKE AS DIRECTED BY COUMADIN CLINIC 40 tablet 3   No current facility-administered medications on file prior to visit.   Review of Systems Constitutional: Negative for increased diaphoresis, other activity, appetite or siginficant weight change other than noted HENT: Negative for worsening hearing loss, ear pain, facial swelling, mouth sores and neck stiffness.     Eyes: Negative for other worsening pain, redness or visual disturbance.  Respiratory: Negative for shortness of breath and wheezing  Cardiovascular: Negative for chest pain and palpitations.  Gastrointestinal: Negative for diarrhea, blood in stool, abdominal distention or other pain Genitourinary: Negative for hematuria, flank pain or change in urine volume.  Musculoskeletal: Negative for myalgias or other joint complaints.  Skin: Negative for color change and wound or drainage.  Neurological: Negative for syncope and numbness. other than noted Hematological: Negative for adenopathy. or other swelling Psychiatric/Behavioral: Negative for hallucinations, SI, self-injury, decreased concentration or other worsening agitation.      Objective:   Physical Exam BP 110/70 mmHg  Pulse 66  Temp(Src) 98.5 F (36.9 C) (Oral)  Ht 5\' 3"  (1.6 m)  Wt 186 lb (84.369 kg)  BMI 32.96 kg/m2  SpO2 97% VS noted,  Constitutional: Pt is oriented to person, place, and time. Appears well-developed and well-nourished, in no significant distress Head: Normocephalic and atraumatic.  Right Ear: External ear normal.  Left Ear: External ear normal.  Nose: Nose normal.  Mouth/Throat: Oropharynx is clear and moist.  Eyes: Conjunctivae and EOM are normal. Pupils are equal, round, and reactive to light.  Neck: Normal range of motion. Neck supple. No JVD present. No tracheal deviation present or significant neck LA or mass Cardiovascular: Normal rate, regular rhythm, normal heart sounds and intact distal pulses.   Pulmonary/Chest: Effort normal and breath sounds without rales or wheezing  Abdominal: Soft. Bowel sounds are normal. NT. No HSM  Musculoskeletal: Normal range of motion. Exhibits no edema.  Lymphadenopathy:  Has no cervical adenopathy.  Neurological: Pt is alert and oriented to person, place, and time. Pt has normal reflexes. No cranial nerve deficit. Motor grossly intact Skin: Skin is warm and dry. No  rash noted.  Psychiatric:  Has normal mood and affect. Behavior is normal.     Assessment & Plan:

## 2014-12-21 NOTE — Progress Notes (Signed)
Pre visit review using our clinic review tool, if applicable. No additional management support is needed unless otherwise documented below in the visit note. 

## 2014-12-21 NOTE — Progress Notes (Signed)
Subjective:   [name] is a 69 y.o. female who presents for Medicare Annual (Subsequent) preventive examination.  Review of Systems:     HRA assessment completed during visit;  Patient is here for Annual Wellness Assessment The Patient was informed that this wellness visit is to identify risk and educate on how to reduce risk for increase disease through lifestyle changes.   ROS deferred to CPE exam with physician HTN; chf; Type DM2; bilateral knee pain; ckd A1c 6.9 BMI: 32.9 hx weight loss / at least 23 lbs; Put her on a medication; medication Diet; medicine has curbed  May eat at 10am oatmeal; lunch cut yogurt; supper varies; meat and vegetables;  Exercise; housekeeping; has a sister that is in hospice and she takes care of her as well Sister is in the hospital / has stage 3 lung cancer;   SAFETY/ one level  Safety reviewed for the home; including removal of clutter; clear paths through the home, eliminating clutter, railing as needed; bathroom safety; community safety; smoke detectors and firearms safety as well as sun protection;  Driving accidents and seatbelt Sun protection Stressors;   Medication review/ New meds  Fall assessment; See Dr. Tamala Julian for knees; right and left but has not fallen since she has seen Dr. Tamala Julian;   Mobilization and Functional losses in the last year. Better this year   Urinary or fecal incontinence  no issues   Counseling: Hep c/ educated and deferred to Dr. Netty Starring Colonoscopy; Jan 2009; Jan 2019 due EKG  11/12/2014 Dexa scan; has a defibrillator  Mammogram declines; does self breast exam Hearing: 4000hz;  Ophthalmology exam; vision checks annually; go in Nov; have lens  Zoster; gave flu shot today     Objective:     Vitals: BP 110/70 mmHg  Pulse 66  Temp(Src) 98.5 F (36.9 C) (Oral)  Ht 5' 3" (1.6 m)  Wt 186 lb (84.369 kg)  BMI 32.96 kg/m2  SpO2 97%  Tobacco History  Smoking status  . Former Smoker  . Quit date:  08/16/1995  Smokeless tobacco  . Never Used     Counseling given: Not Answered   Past Medical History  Diagnosis Date  . Chronic systolic heart failure   . Diabetes mellitus     type 1  . Hypertension   . Stroke 2010    eye doctor said she had TIA  . Ischemic cardiomyopathy   . Ventricular tachycardia     Polymorphic  . 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   . Atrial fibrillation     controlled with amiodarone, on coumadin  . Coronary artery disease 01/31/2011  . Chronic renal insufficiency   . Lumbar spondylosis 01/11/2012  . History of chicken pox   . Allergy   . Hyperlipidemia   . Arthritis   . Congestive heart failure    Past Surgical History  Procedure Laterality Date  . Abdominal hysterectomy  1995  . Cholecystectomy  1985  . Tubaligation  1980  . Icd  2003/2007    implanted by Dr Rollene Fare, most recent generator change 2/13 by Dr Rayann Heman, Analyze ST study patient  . Cardiac defibrillator placement    . Left heart catheterization with coronary angiogram N/A 01/30/2011    Procedure: LEFT HEART CATHETERIZATION WITH CORONARY ANGIOGRAM;  Surgeon: Larey Dresser, MD;  Location: [facility] CATH LAB;  Service: Cardiovascular;  Laterality: N/A;  . Implantable cardioverter defibrillator (icd) generator change N/A 05/05/2011    Procedure: ICD GENERATOR CHANGE;  Surgeon:  James Allred, MD;  Location: [facility] CATH LAB;  Service: Cardiovascular;  Laterality: N/A;   Family History  Problem Relation Age of Onset  . Diabetes Mother   . Heart disease Mother   . Hyperlipidemia Mother   . Hypertension Mother   . Heart disease Father   . Heart attack Father   . Hypertension Father   . Early death Brother 27  . Breast cancer Sister   . Lung cancer Sister   . Irritable bowel syndrome Sister   . Esophageal cancer Neg Hx   . Colon cancer Maternal Aunt   . Colon polyps Neg Hx    History  Sexual Activity  . Sexual Activity: No    Outpatient Encounter Prescriptions as of  12/21/2014  Medication Sig  . amiodarone (PACERONE) 200 MG tablet TAKE 1 TABLET (200 MG TOTAL) BY MOUTH DAILY.  . Blood Glucose Monitoring Suppl (ACCU-CHEK NANO SMARTVIEW) W/DEVICE KIT Use to test blood sugar 4 times daily as instructed. Dx code: E11.59  . carvedilol (COREG) 25 MG tablet TAKE 1 TABLET BY MOUTH TWICE A DAY WITH A MEAL  . Cholecalciferol (VITAMIN D3) 2000 UNITS TABS Take 2,000 Units by mouth daily.   . furosemide (LASIX) 20 MG tablet TAKE 1 TABLET (20 MG TOTAL) BY MOUTH DAILY.  . gabapentin (NEURONTIN) 400 MG capsule TAKE 1 CAPSULE (400 MG TOTAL) BY MOUTH 2 (TWO) TIMES DAILY.  . glucose blood (ACCU-CHEK SMARTVIEW) test strip Use to test blood sugar 4 times daily as instructed. Dx code E11.59  . glycopyrrolate (ROBINUL) 1 MG tablet TAKE 1 TABLET (1 MG TOTAL) BY MOUTH 2 (TWO) TIMES DAILY.  . Insulin Detemir (LEVEMIR FLEXTOUCH) 100 UNIT/ML Pen Inject under skin 20 units in am and 20 units at bedtime  . insulin lispro (HUMALOG) 100 UNIT/ML injection Inject 0.06-0.12 mLs (6-12 Units total) into the skin 3 (three) times daily before meals.  . lisinopril (PRINIVIL,ZESTRIL) 5 MG tablet TAKE 1 TABLET BY MOUTH DAILY  . metFORMIN (GLUCOPHAGE-XR) 500 MG 24 hr tablet Take 1 tablet (500 mg total) by mouth 2 (two) times daily with a meal.  . montelukast (SINGULAIR) 10 MG tablet Take 1 tablet (10 mg total) by mouth daily.  . Multiple Vitamins-Minerals (EYE VITAMINS PO) Take 1 tablet by mouth daily.   . nitroGLYCERIN (NITROSTAT) 0.4 MG SL tablet Place 1 tablet (0.4 mg total) under the tongue every 5 (five) minutes as needed for chest pain.  . nizatidine (AXID) 150 MG capsule Take 1 capsule (150 mg total) by mouth daily.  . omeprazole (PRILOSEC) 20 MG capsule TAKE 1 CAPSULE BY MOUTH DAILY.  . rosuvastatin (CRESTOR) 20 MG tablet Take 1 tablet (20 mg total) by mouth daily.  . traMADol (ULTRAM) 50 MG tablet Take 1 tablet (50 mg total) by mouth every 6 (six) hours as needed. (Patient taking differently:  Take 50 mg by mouth every 6 (six) hours as needed (pain). )  . venlafaxine (EFFEXOR) 37.5 MG tablet TAKE 1 TABLET (37.5 MG TOTAL) BY MOUTH 2 (TWO) TIMES DAILY.  . Vitamin D, Ergocalciferol, (DRISDOL) 50000 UNITS CAPS capsule Take 1 capsule (50,000 Units total) by mouth once a week.  . warfarin (COUMADIN) 1 MG tablet TAKE AS DIRECTED BY COUMADIN CLINIC  . [DISCONTINUED] CRESTOR 20 MG tablet TAKE 1 TABLET BY MOUTH EVERY DAY  . [DISCONTINUED] rosuvastatin (CRESTOR) 20 MG tablet Take 20 mg by mouth daily.   No facility-administered encounter medications on file as of 12/21/2014.    Activities of Daily Living No   flowsheet data found.  Patient Care Team: Biagio Borg, MD as PCP - General (Internal Medicine)    Assessment:    Assessment   Today patient counseled on age appropriate routine health concerns for screening and prevention, each reviewed and up to date or declined. Immunizations reviewed and took flu shot and will come back in 2 weeks for Prevnar . Labs deferred for CPE or medical fup by MD.  Risk factors for depression reviewed and negative; although sad due to sister and current illness; States she is adapting. Hearing function good  and visual acuity are intact. Has annual eye exams.  ADLs screened and addressed as needed. Can't climb stairs but overall function better since seeing Dr. Tamala Julian;  Functional ability and level of safety reviewed and appropriate. Educated on memory loss and AD8 socre 0 with no loss of function.  Patient provided with a copy of personalized plan for preventive services and due dates  FALL PREVENTION Educated on prevention falls; Exercise, toning and strengthening; Balance exercises  Wear Comfortable shoes; Vision checks; home modifications for safety  HEPATITIS SCREEN reviewed; will defer hep c to MD   EXERCISE / DIET RECOMMENDATIONS; the patient states she is busy during the day; but exercise limited due to knee pain under tx   Exercise Activities and  Dietary recommendations    Goals    None     Fall Risk Fall Risk  12/15/2012 08/30/2012  Falls in the past year? No Yes  Number falls in past yr: - 1  Injury with Fall? - No   Depression Screen PHQ 2/9 Scores 12/15/2012 08/30/2012  PHQ - 2 Score 0 0     Cognitive Testing No flowsheet data found.  Immunization History  Administered Date(s) Administered  . Influenza,inj,Quad PF,36+ Mos 12/15/2012, 01/16/2014, 12/21/2014  . Pneumococcal Polysaccharide-23 08/30/2012  . Td 03/23/2006   Screening Tests Health Maintenance  Topic Date Due  . Hepatitis C Screening  1945/06/27  . MAMMOGRAM  02/03/1997  . ZOSTAVAX  02/04/2007  . DEXA SCAN  02/04/2012  . PNA vac Low Risk Adult (2 of 2 - PCV13) 08/30/2013  . URINE MICROALBUMIN  12/15/2013  . INFLUENZA VACCINE  10/22/2014  . OPHTHALMOLOGY EXAM  12/09/2014  . HEMOGLOBIN A1C  01/18/2015  . FOOT EXAM  07/19/2015  . TETANUS/TDAP  03/23/2016  . COLONOSCOPY  03/23/2017      Plan:    During the course of the visit the patient was educated and counseled about the following appropriate screening and preventive services:   Vaccines to include Pneumoccal, Influenza, Hepatitis B, Td, Zostavax, HCV  Had flu vaccine and/prevnar will be given in 2 weeks  Electrocardiogram/ 10/2014  Cardiovascular Disease/ deferred  Colorectal cancer screening/ 2009 and due again jan 2019  Bone density screening/ to discuss with Dr. Jenny Reichmann   Diabetes screening/ deferred  Glaucoma screening/ negative at annual exams  Mammography/PAP declines due to pacemaker  Nutrition counseling / has actually lost weight   Patient Instructions (the written plan) was given to the patient.   Wynetta Fines, RN  12/21/2014

## 2014-12-21 NOTE — Addendum Note (Signed)
Addended by: Biagio Borg on: 12/21/2014 01:27 PM   Modules accepted: Orders

## 2014-12-21 NOTE — Assessment & Plan Note (Signed)
For 6 mo labs, also f/u with endo,  to f/u any worsening symptoms or concerns

## 2014-12-21 NOTE — Assessment & Plan Note (Signed)

## 2014-12-25 ENCOUNTER — Ambulatory Visit (INDEPENDENT_AMBULATORY_CARE_PROVIDER_SITE_OTHER): Payer: Commercial Managed Care - HMO | Admitting: Internal Medicine

## 2014-12-25 ENCOUNTER — Ambulatory Visit (INDEPENDENT_AMBULATORY_CARE_PROVIDER_SITE_OTHER): Payer: Commercial Managed Care - HMO | Admitting: Family Medicine

## 2014-12-25 ENCOUNTER — Ambulatory Visit (INDEPENDENT_AMBULATORY_CARE_PROVIDER_SITE_OTHER)
Admission: RE | Admit: 2014-12-25 | Discharge: 2014-12-25 | Disposition: A | Discharge status: Home / Self Care | Payer: Commercial Managed Care - HMO | Source: Ambulatory Visit | Attending: Internal Medicine | Admitting: Internal Medicine

## 2014-12-25 ENCOUNTER — Encounter: Payer: Self-pay | Admitting: Family Medicine

## 2014-12-25 ENCOUNTER — Encounter: Payer: Self-pay | Admitting: Internal Medicine

## 2014-12-25 VITALS — BP 108/60 | HR 61 | Ht 63.0 in | Wt 186.0 lb

## 2014-12-25 VITALS — BP 108/60 | HR 61 | Temp 98.4°F | Resp 12 | Wt 186.8 lb

## 2014-12-25 DIAGNOSIS — M129 Arthropathy, unspecified: Secondary | ICD-10-CM

## 2014-12-25 DIAGNOSIS — E1151 Type 2 diabetes mellitus with diabetic peripheral angiopathy without gangrene: Secondary | ICD-10-CM

## 2014-12-25 DIAGNOSIS — E1165 Type 2 diabetes mellitus with hyperglycemia: Secondary | ICD-10-CM | POA: Diagnosis not present

## 2014-12-25 DIAGNOSIS — IMO0002 Reserved for concepts with insufficient information to code with codable children: Secondary | ICD-10-CM

## 2014-12-25 DIAGNOSIS — M8580 Other specified disorders of bone density and structure, unspecified site: Secondary | ICD-10-CM

## 2014-12-25 DIAGNOSIS — M1712 Unilateral primary osteoarthritis, left knee: Secondary | ICD-10-CM

## 2014-12-25 MED ORDER — INSULIN DETEMIR 100 UNIT/ML FLEXPEN: PEN_INJECTOR | SUBCUTANEOUS | Status: DC

## 2014-12-25 NOTE — Patient Instructions (Signed)
Please decrease Levemir to 25 units at bedtime. Continue: - Mealtime Humalog doses:   - 6 units before a small meal - 8 units before a large meal - Humalog Sliding scale: - 150-175: + 1 unit  - 176-200: + 2 units  - 201-225: + 3 units  - > 226: + 4 units  - Metformin XR 500 mg 2x a day  Please return in 3 months with your sugar log.

## 2014-12-25 NOTE — Assessment & Plan Note (Signed)
Patient given injection today and tolerated the procedure well. We discussed postinjection instructions. We discussed icing. We discussed home exercises in which activities to potentially avoid. Patient's will come back and see me again in 3-4 weeks. She could be a candidate for viscous supplementation as well as possibly custom bracing if necessary in the future.

## 2014-12-25 NOTE — Patient Instructions (Signed)
Good to see you Injected left knee  Ice is your friend You know the drill See me when you need me

## 2014-12-25 NOTE — Progress Notes (Signed)
Pre visit review using our clinic review tool, if applicable. No additional management support is needed unless otherwise documented below in the visit note. 

## 2014-12-25 NOTE — Progress Notes (Signed)
Patient ID: [name], female   DOB: 1945/04/17, 69 y.o.   MRN: [id number]   HPI: [name] is a 69 y.o.-year-old female, returning for f/u for DM2, insulin-dependent, uncontrolled, with complications (CAD, ICM - had ICD, peripheral neuropathy, gastroparesis). Last visit 5.5 mo ago.  Last hemoglobin A1c was: Lab Results  Component Value Date   HGBA1C 6.5 12/21/2014   HGBA1C 6.9* 07/19/2014   HGBA1C 8.2* 04/19/2014  07/03/2013: 7.2% - through her insurance   Pt is on a regimen of: - Levemir 20 units in am and 20 units at bedtime.  - Mealtime Humalog doses:   - 6 units before a small meal - 8 units before a large meal - Humalog Sliding scale: - 150-175: + 1 unit  - 176-200: + 2 units  - 201-225: + 3 units  - > 226: + 4 units  - Metformin XR 500 mg 2x a day  Pt checks her sugars 3-4 a day and they are still good, but still fluctuating- brings a good log: - am: 148-246, 260 >> 125-219, 246 (higher values after cough syrup at night) >> 72-140, 157 >> 50-145, 167, 205 - after b'fast: 175 - before lunch: 57 x1, 66-160, spikes at 200 >> 58x1, 75,111-183 >> 51-140, 180 >> 50-118, 170, 298 >> 50-166, 184 - after lunch: 55-171 >> n/c >> 100 >> n/c >> n/c >> 57x1 - before dinner: 78-293 >> 61-160, 241 >> 53, 69-152, 222 - bedtime: 85-265 >> 79-200, 220 x 1, 237, 307 x1 >> 60, 101-188, 241 >> 74-180, some 200s >> 100-240 >> 52-191, 255 Lowest sugar was 44  she has hypoglycemia awareness at 70. Highest sugar was 297.  Pt's meals are: - Breakfast: small bowl of cereal frosted with 2% milk - Lunch (5-14 units): vegetable + starch; turkey/ham sandwich - Dinner (20 units): fast food or cooks: meat + vegetables + sometimes potatoes - Snacks: fruit (apple) Did not see nutrition yet >> did not have transportation She has a glass of 2% milk before bedtime. Tried Almond milk but did not like it as it was too sweet.  - Has CKD, last BUN/creatinine:  Lab Results  Component Value  Date   BUN 34* 12/21/2014   CREATININE 1.44* 12/21/2014  ACR in 12/15/2012: 1.0.  On Lisinopril. - last set of lipids: Lab Results  Component Value Date   CHOL 135 12/21/2014   HDL 54.20 12/21/2014   LDLCALC 64 12/21/2014   TRIG 87.0 12/21/2014   CHOLHDL 2 12/21/2014  she is on Crestor. - last eye exam was in 11/2013. No DR. Has one scheduled for next month. - + numbness and tingling in her feet - on neurontin. Foot exam performed 07/19/2014 >> neuropathy.  I reviewed pt's medications, allergies, PMH, social hx, family hx, and changes were documented in the history of present illness. Otherwise, unchanged from my initial visit note.  ROS: Constitutional: no weight loss/gain, no fatigue, no subjective hyperthermia/hypothermia Eyes: no blurry vision, no xerophthalmia ENT: no sore throat, no nodules palpated in throat, no dysphagia/odynophagia, + tinnitus Cardiovascular: no CP/SOB/no palpitations/+ leg swelling Respiratory: no cough/SOB Gastrointestinal: no N/V/D/C Musculoskeletal: no muscle/joint aches and swelling Skin: no rashes  PE: BP 108/60 mmHg  Pulse 61  Temp(Src) 98.4 F (36.9 C) (Oral)  Resp 12  Wt 186 lb 12.8 oz (84.732 kg)  SpO2 94% Body mass index is 33.1 kg/(m^2).  Wt Readings from Last 3 Encounters:  12/21/14 186 lb (84.369 kg)  11/12/14 188 lb 9.6  oz (85.548 kg)  09/10/14 192 lb (87.091 kg)   Constitutional: overweight, in NAD, full supraclavicular fat pads Eyes: PERRLA, EOMI, no exophthalmos ENT: moist mucous membranes, no thyromegaly, no cervical lymphadenopathy Cardiovascular: RRR, + SEM +1/6, no RG, + nonpitting LE edema Respiratory: CTA B Gastrointestinal: abdomen soft, NT, ND, BS+ Musculoskeletal: no deformities, strength intact in all 4 Skin: moist, warm, no rashes  ASSESSMENT: 1. DM2, insulin-dependent, uncontrolled, with complications - CAD, ICM - s/p ICD placement - CKD - PN - on neurontin - gastroparesis per [facility] 06/18/2012 >> 68  minutes: 92%, 120 minutes: 82%  PLAN:  1. Patient with long-standing, uncontrolled diabetes, on basal bolus regimen, with much improved sugars after starting Metformin XR, and last HbA1c excellent, at 6.5%. She still has some low CBGs, which I belive are from excess of basal insulin >> will reduce Levemir dose. - I suggested to: Patient Instructions  Please decrease Levemir to 25 units at bedtime. Continue: - Mealtime Humalog doses:   - 6 units before a small meal - 8 units before a large meal - Humalog Sliding scale: - 150-175: + 1 unit  - 176-200: + 2 units  - 201-225: + 3 units  - > 226: + 4 units  - Metformin XR 500 mg 2x a day  Please return in 3 months with your sugar log.   - continue checking her sugars at different times of the day - check 4 times a day - new eye exam coming up Return in about 3 months (around 03/27/2015).

## 2014-12-25 NOTE — Progress Notes (Signed)
  [facility] [address], [place] [address] Phone: [phone] Subjective:     CC: bilateral knee pain  [id number] [name] is a 69 y.o. female coming in with complaint of bilateral knee pain. Patient was seen previously and did have an injection over 3 months ago. Patient states that her right knee seems to be doing very well but the left knee seems to be doing a little worse. Patient states that the pain seems to be on the medial aspect still. Patient has known osteophytic changes of the knee. Patient is still able to ambulate and do daily activities but is becoming more difficult. Denies any nighttime awakening. Denies any significant instability with the knee is giving out on her.    Past medical history, social, surgical and family history all reviewed in electronic medical record.   Review of Systems: No headache, visual changes, nausea, vomiting, diarrhea, constipation, dizziness, abdominal pain, skin rash, fevers, chills, night sweats, weight loss, swollen lymph nodes, body aches, joint swelling, muscle aches, chest pain, shortness of breath, mood changes.   Objective Blood pressure 108/60, pulse 61, height 5\' 3"  (1.6 m), weight 186 lb (84.369 kg), SpO2 94 %.  General: No apparent distress alert and oriented x3 mood and affect normal, dressed appropriately. Overweight HEENT: Pupils equal, extraocular movements intact  Respiratory: Patient's speak in full sentences and does not appear short of breath  Cardiovascular: No lower extremity edema, non tender, no erythema  Skin: Warm dry intact with no signs of infection or rash on extremities or on axial skeleton.  Abdomen: Soft nontender  Neuro: Cranial nerves II through XII are intact, neurovascularly intact in all extremities with 2+ DTRs and 2+ pulses.  Lymph: No lymphadenopathy of posterior or anterior cervical chain or axillae bilaterally.  Gait normal with good balance and  coordination.  MSK:  Non tender with full range of motion and good stability and symmetric strength and tone of shoulders, elbows, wrist, hip, and ankles bilaterally.  Knee: left Normal to inspection with no erythema or effusion or obvious bony abnormalities. Worsening tenderness over the medial joint line on the left knee but not the right knee ROM full in flexion and extension and lower leg rotation. Ligaments with solid consistent endpoints including ACL, PCL, LCL, MCL. Negative Mcmurray's, Apley's, and Thessalonian tests. painful patellar compression Moderate crepitus noted on range of motion Patellar and quadriceps tendons unremarkable. Hamstring and quadriceps strength is normal.  Contralateral knee doing better than previous exam.  After informed written and verbal consent, patient was seated on exam table. Left knee was prepped with alcohol swab and utilizing anterolateral approach, patient's left knee space was injected with 4:1  marcaine 0.5%: Kenalog 40mg /dL. Patient tolerated the procedure well without immediate complications.   Impression and Recommendations:     This case required medical decision making of moderate complexity.

## 2014-12-28 ENCOUNTER — Other Ambulatory Visit: Payer: Self-pay | Admitting: Cardiology

## 2014-12-28 ENCOUNTER — Telehealth: Payer: Self-pay

## 2014-12-28 NOTE — Telephone Encounter (Signed)
Opened accidentally 

## 2014-12-31 ENCOUNTER — Other Ambulatory Visit (HOSPITAL_COMMUNITY): Payer: Self-pay | Admitting: *Deleted

## 2015-01-15 ENCOUNTER — Ambulatory Visit: Payer: Commercial Managed Care - HMO | Admitting: Family Medicine

## 2015-01-20 ENCOUNTER — Other Ambulatory Visit (HOSPITAL_COMMUNITY): Payer: Self-pay | Admitting: Internal Medicine

## 2015-01-20 ENCOUNTER — Other Ambulatory Visit: Payer: Self-pay | Admitting: Internal Medicine

## 2015-02-11 ENCOUNTER — Ambulatory Visit (INDEPENDENT_AMBULATORY_CARE_PROVIDER_SITE_OTHER): Payer: Commercial Managed Care - HMO | Admitting: *Deleted

## 2015-02-11 DIAGNOSIS — I255 Ischemic cardiomyopathy: Secondary | ICD-10-CM

## 2015-02-11 LAB — CUP PACEART REMOTE DEVICE CHECK
Battery Remaining Longevity: 53 mo
Battery Remaining Percentage: 54 %
Battery Voltage: 2.95 V
Brady Statistic AP VP Percent: 2.3 %
Date Time Interrogation Session: 20161121094711
HIGH POWER IMPEDANCE MEASURED VALUE: 43 Ohm
Implantable Lead Implant Date: 20070117
Implantable Lead Location: 753859
Implantable Lead Model: 7040
Lead Channel Impedance Value: 450 Ohm
Lead Channel Impedance Value: 590 Ohm
Lead Channel Setting Pacing Amplitude: 2.5 V
MDC IDC LEAD IMPLANT DT: 20070117
MDC IDC LEAD LOCATION: 753860
MDC IDC MSMT LEADCHNL RA SENSING INTR AMPL: 5 mV
MDC IDC MSMT LEADCHNL RV SENSING INTR AMPL: 11.8 mV
MDC IDC SET LEADCHNL RA PACING AMPLITUDE: 2 V
MDC IDC SET LEADCHNL RV PACING PULSEWIDTH: 0.5 ms
MDC IDC SET LEADCHNL RV SENSING SENSITIVITY: 0.5 mV
MDC IDC STAT BRADY AP VS PERCENT: 93 %
MDC IDC STAT BRADY AS VP PERCENT: 1 %
MDC IDC STAT BRADY AS VS PERCENT: 4.4 %
MDC IDC STAT BRADY RA PERCENT PACED: 95 %
MDC IDC STAT BRADY RV PERCENT PACED: 2.4 %
Pulse Gen Serial Number: 819806

## 2015-02-11 NOTE — Progress Notes (Signed)
Remote ICD transmission.   

## 2015-02-16 ENCOUNTER — Other Ambulatory Visit: Payer: Self-pay | Admitting: Internal Medicine

## 2015-02-25 ENCOUNTER — Other Ambulatory Visit: Payer: Self-pay | Admitting: Internal Medicine

## 2015-02-26 ENCOUNTER — Other Ambulatory Visit (HOSPITAL_COMMUNITY): Payer: Self-pay

## 2015-02-26 MED ORDER — AMIODARONE HCL 200 MG PO TABS: ORAL_TABLET | ORAL | Status: DC

## 2015-03-06 ENCOUNTER — Encounter: Payer: Self-pay | Admitting: Cardiology

## 2015-03-16 ENCOUNTER — Other Ambulatory Visit: Payer: Self-pay | Admitting: Family Medicine

## 2015-03-16 ENCOUNTER — Other Ambulatory Visit: Payer: Self-pay | Admitting: Internal Medicine

## 2015-03-19 NOTE — Telephone Encounter (Signed)
Refill done.  

## 2015-03-19 NOTE — Telephone Encounter (Signed)
Please advise 

## 2015-03-22 ENCOUNTER — Telehealth: Payer: Self-pay | Admitting: Internal Medicine

## 2015-03-22 MED ORDER — INSULIN DETEMIR 100 UNIT/ML FLEXPEN: PEN_INJECTOR | SUBCUTANEOUS | Status: DC

## 2015-03-22 NOTE — Telephone Encounter (Signed)
Patient called stating that she would like a refill on her medication   EE:[id number]  Pharmacy: [facility] [phone]  Thank you

## 2015-03-22 NOTE — Telephone Encounter (Signed)
Refill sent to pt's pharmacy. 

## 2015-03-26 ENCOUNTER — Other Ambulatory Visit: Payer: Self-pay

## 2015-03-26 MED ORDER — VITAMIN D (ERGOCALCIFEROL) 1.25 MG (50000 UNIT) PO CAPS: 50000.0000 [IU] | ORAL_CAPSULE | ORAL | Status: DC

## 2015-03-26 MED ORDER — MONTELUKAST SODIUM 10 MG PO TABS: 10.0000 mg | ORAL_TABLET | Freq: Every day | ORAL | Status: DC

## 2015-03-27 ENCOUNTER — Other Ambulatory Visit: Payer: Self-pay | Admitting: Endocrinology

## 2015-03-28 ENCOUNTER — Ambulatory Visit: Payer: Commercial Managed Care - HMO | Admitting: Internal Medicine

## 2015-03-29 ENCOUNTER — Encounter (HOSPITAL_COMMUNITY): Payer: Self-pay

## 2015-03-29 ENCOUNTER — Ambulatory Visit (INDEPENDENT_AMBULATORY_CARE_PROVIDER_SITE_OTHER): Payer: Commercial Managed Care - HMO | Admitting: *Deleted

## 2015-03-29 ENCOUNTER — Ambulatory Visit (HOSPITAL_COMMUNITY)
Admission: RE | Admit: 2015-03-29 | Discharge: 2015-03-29 | Disposition: A | Discharge status: Home / Self Care | Payer: Commercial Managed Care - HMO | Source: Ambulatory Visit | Attending: Internal Medicine | Admitting: Internal Medicine

## 2015-03-29 VITALS — BP 132/78 | HR 64 | Wt 193.0 lb

## 2015-03-29 DIAGNOSIS — I4891 Unspecified atrial fibrillation: Secondary | ICD-10-CM

## 2015-03-29 DIAGNOSIS — Z7984 Long term (current) use of oral hypoglycemic drugs: Secondary | ICD-10-CM | POA: Insufficient documentation

## 2015-03-29 DIAGNOSIS — I129 Hypertensive chronic kidney disease with stage 1 through stage 4 chronic kidney disease, or unspecified chronic kidney disease: Secondary | ICD-10-CM | POA: Insufficient documentation

## 2015-03-29 DIAGNOSIS — I255 Ischemic cardiomyopathy: Secondary | ICD-10-CM | POA: Diagnosis not present

## 2015-03-29 DIAGNOSIS — N189 Chronic kidney disease, unspecified: Secondary | ICD-10-CM | POA: Insufficient documentation

## 2015-03-29 DIAGNOSIS — I480 Paroxysmal atrial fibrillation: Secondary | ICD-10-CM | POA: Diagnosis not present

## 2015-03-29 DIAGNOSIS — Z794 Long term (current) use of insulin: Secondary | ICD-10-CM | POA: Insufficient documentation

## 2015-03-29 DIAGNOSIS — Z5181 Encounter for therapeutic drug level monitoring: Secondary | ICD-10-CM

## 2015-03-29 DIAGNOSIS — I4819 Other persistent atrial fibrillation: Secondary | ICD-10-CM

## 2015-03-29 DIAGNOSIS — I5022 Chronic systolic (congestive) heart failure: Secondary | ICD-10-CM | POA: Insufficient documentation

## 2015-03-29 DIAGNOSIS — Z7901 Long term (current) use of anticoagulants: Secondary | ICD-10-CM

## 2015-03-29 DIAGNOSIS — I481 Persistent atrial fibrillation: Secondary | ICD-10-CM | POA: Diagnosis not present

## 2015-03-29 DIAGNOSIS — I2510 Atherosclerotic heart disease of native coronary artery without angina pectoris: Secondary | ICD-10-CM | POA: Insufficient documentation

## 2015-03-29 DIAGNOSIS — E119 Type 2 diabetes mellitus without complications: Secondary | ICD-10-CM | POA: Insufficient documentation

## 2015-03-29 DIAGNOSIS — Z79899 Other long term (current) drug therapy: Secondary | ICD-10-CM | POA: Insufficient documentation

## 2015-03-29 LAB — HM DIABETES EYE EXAM

## 2015-03-29 LAB — POCT INR: INR: 1.4

## 2015-03-29 MED ORDER — SPIRONOLACTONE 25 MG PO TABS: 12.5000 mg | ORAL_TABLET | Freq: Every day | ORAL | Status: DC

## 2015-03-29 NOTE — Progress Notes (Signed)
Patient ID: [name], female   DOB: December 31, 1945, 70 y.o.   MRN: [id number] PCP: [facility] Cardiology: Dr. Aundra Dubin  70 yo with history of CAD, ischemic cardiomyopathy, and atrial fibrillation presents for cardiology followup.  Patient was hospitalized in 11/12 at [facility] for VT with ICD discharge.  She had a left heart cath showing patent stents.  EF was 35-40% by echo.  She is on amiodarone.  She has had periodic problems with creatinine rising with medication adjustments.  Most recent echo in 6/16 showed EF 40-45%.   She returns for follow up. She has lost 4 lbs.  Feeling good overall.  Some dyspnea with stairs but no problems walking on flat ground.  Not very active because of bilateral knee pain.  No orthopnea/PND.  No palpitations.  No melena/BRBPR.  She is in NSR today.   No chest pain.    Labs (10/12): LDL 73, HDL 44 Labs (11/12): K 3.9, creatinine 1.1, LFTs normal, TSH normal Labs (12/12): K 3.9, creatinine 1.5, proBNP 18 Labs (1/13): LDL 82, HDL 57 Labs (2/13): K 4.3, creatinine 1.5 Labs (5/13): creatinine 2.4 => 1.7, LFTs normal, TSH normal, proBNP 18 Labs (6/13): K 4.2, creatinine 1.7 Labs (10/13): K 4.3, creatinine 1.4 Labs (4/14); LFTs normal, TSH normal, LDL 71, HDL 58 Labs (5/14): K 4.5, creatinine 1.4 Labs (8/14): TSH normal, LFTs normal Labs (9/14): K 4.2, creatinine 1.8, LDL 75, HDL 54 Labs (07/31/13) K 3.7 Creatinine 1.8 BNP 148  Labs (9/15): K 4.3, creatinine 1.24, LFTs normal, LDL 81, HDL 58, TSH normal Labs (9/16): K 4.4, creatinine 1.44, LDL 64, HDL 54, LFTs normal, TSH normal, HCT 38  PMH: 1. Diabetes mellitus 2. CVA, TIA in 2010 3. HTN 4. CKD 5. H/o TAH 6. H/o CCY 7. Sciatica 8. Atrial fibrillation 9. CAD: s/p LAD and RCA PCI.  Last LHC in 11/12 with patent proximal LAD stent, ostial 70% D1 (jailed by stent), mild LAD stent patent, patent RCA stents, EF 40% with global hypokinesis.  10. Ischemic cardiomyopathy: Echo (10/12): EF 35-40%, moderate  focal basal septal hypertrophy, inferior akinesis, grade I diastolic dysfunction, moderate aortic insufficiency. St Jude dual chamber ICD.  Spironolactone stopped when creatinine rose to 2.5.  Echo (5/14) with EF 40-45%, mild AI.  Echo (6/16) with EF 40-45%, inferior/inferoseptal hypokinesis, mild LVH.  11. Aortic insufficiency: moderate in past but mild on most recent echo.   12. 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  13. Gastroparesis  SH: Divorced.  3 children.  Quit smoking in 1997. Lives in [place].  [place].   FH: CAD  ROS: All systems reviewed and negative except as per HPI.   Current Outpatient Prescriptions  Medication Sig Dispense Refill  . amiodarone (PACERONE) 200 MG tablet TAKE 1 TABLET (200 MG TOTAL) BY MOUTH DAILY. 60 tablet 2  . Blood Glucose Monitoring Suppl (ACCU-CHEK NANO SMARTVIEW) W/DEVICE KIT Use to test blood sugar 4 times daily as instructed. Dx code: E11.59 1 kit 0  . carvedilol (COREG) 25 MG tablet TAKE 1 TABLET BY MOUTH TWICE A DAY WITH A MEAL 60 tablet 3  . Cholecalciferol (VITAMIN D3) 2000 UNITS TABS Take 2,000 Units by mouth daily.     . furosemide (LASIX) 20 MG tablet TAKE 1 TABLET (20 MG TOTAL) BY MOUTH DAILY. 30 tablet 8  . gabapentin (NEURONTIN) 400 MG capsule TAKE 1 CAPSULE (400 MG TOTAL) BY MOUTH 2 (TWO) TIMES DAILY. 180 capsule 0  . glucose blood (ACCU-CHEK SMARTVIEW) test strip Use to  test blood sugar 4 times daily as instructed. Dx code E11.59 200 each 11  . glycopyrrolate (ROBINUL) 1 MG tablet TAKE 1 TABLET (1 MG TOTAL) BY MOUTH 2 (TWO) TIMES DAILY. 60 tablet 2  . Insulin Detemir (LEVEMIR FLEXTOUCH) 100 UNIT/ML Pen Inject under skin 25 units at bedtime 45 mL 1  . insulin lispro (HUMALOG) 100 UNIT/ML injection Inject 0.06-0.12 mLs (6-12 Units total) into the skin 3 (three) times daily before meals. 20 mL 2  . lisinopril (PRINIVIL,ZESTRIL) 5 MG tablet TAKE 1 TABLET BY MOUTH DAILY 30 tablet 6  . metFORMIN (GLUCOPHAGE-XR) 500 MG 24 hr tablet TAKE 1 TABLET  (500 MG TOTAL) BY MOUTH 2 (TWO) TIMES DAILY WITH A MEAL. 120 tablet 0  . montelukast (SINGULAIR) 10 MG tablet Take 1 tablet (10 mg total) by mouth daily. 90 tablet 3  . Multiple Vitamins-Minerals (EYE VITAMINS PO) Take 1 tablet by mouth daily.     . nizatidine (AXID) 150 MG capsule Take 1 capsule (150 mg total) by mouth daily. 90 capsule 3  . omeprazole (PRILOSEC) 20 MG capsule TAKE 1 CAPSULE BY MOUTH DAILY. 30 capsule 5  . rosuvastatin (CRESTOR) 20 MG tablet TAKE 1 TABLET BY MOUTH DAILY. NEEDS OFFICE VISIT FOR ADDITIONAL REFILLS 30 tablet 0  . traMADol (ULTRAM) 50 MG tablet Take 1 tablet (50 mg total) by mouth every 6 (six) hours as needed. (Patient taking differently: Take 50 mg by mouth every 6 (six) hours as needed (pain). ) 120 tablet 1  . venlafaxine (EFFEXOR) 37.5 MG tablet TAKE 1 TABLET BY MOUTH 2 (TWO) TIMES DAILY. 60 tablet 3  . Vitamin D, Ergocalciferol, (DRISDOL) 50000 units CAPS capsule Take 1 capsule (50,000 Units total) by mouth once a week. 12 capsule 3  . warfarin (COUMADIN) 1 MG tablet TAKE AS DIRECTED BY COUMADIN CLINIC 40 tablet 3  . nitroGLYCERIN (NITROSTAT) 0.4 MG SL tablet Place 1 tablet (0.4 mg total) under the tongue every 5 (five) minutes as needed for chest pain. (Patient not taking: Reported on 03/29/2015) 25 tablet 1  . spironolactone (ALDACTONE) 25 MG tablet Take 0.5 tablets (12.5 mg total) by mouth daily. 15 tablet 3   No current facility-administered medications for this encounter.    BP 132/78 mmHg  Pulse 64  Wt 193 lb (87.544 kg)  SpO2 97% General: NAD, obese Neck: Thick, JVP 5-6 cm, no thyromegaly or thyroid nodule.  Lungs: Clear to auscultation bilaterally with normal respiratory effort. CV: Nondisplaced PMI.  Heart regular S1/S2, no S3/S4, 2/6 early SEM.  No edema.  No carotid bruit.  Normal pedal pulses.  Abdomen: Obese, Soft, nontender, no hepatosplenomegaly, no distention.  Neurologic: Alert and oriented x 3.  Psych: Normal affect. Extremities: No  clubbing or cyanosis.   Assessment/Plan 1. Atrial fibrillation  Paroxysmal, she is in NSR today. Continue amiodarone 200 mg daily. Continue warfarin. Has history of  CVA, needs Lovenox bridge if she has to stop warfarin at any point of time.  Needs yearly eye exams. Check TSH and LFTs today.  2. Coronary artery disease   No evidence of ischemia. Nonobstructive disease on last cath. As she is on warfarin also with stable CAD, she is not on aspirin. Continue statin, acceptable lipids in 9/16.  3. Ventricular tachycardia On amiodarone with suppression of VT.  4. Ischemic cardiomyopathy NYHA class II symptoms, stable.  She is not volume overloaded. EF 40-45% on 6/16 echo. - Continue current dose of lasix.   - Continue current doses of Coreg and lisinopril.   -  I will re-try her on spironolactone 12.5 mg daily. BMET in 10 days.  5. CKD: BMET 10 days.   Loralie Champagne 03/29/2015

## 2015-03-29 NOTE — Patient Instructions (Signed)
Start Spironolactone 12.5 mg (1/2 tab) daily  We have provided you with a  Prescription for Tramadol, further refills will have to come from your primary care MD  Labs in 2 weeks  We will contact you in 4 months to schedule your next appointment.

## 2015-04-02 ENCOUNTER — Telehealth (HOSPITAL_COMMUNITY): Payer: Self-pay

## 2015-04-02 NOTE — Telephone Encounter (Signed)
Patient called CHF triage line stating recently starting spironolactone caused N/V and headache. Patient does not want to take medication any longer. Will forward to provider and see what else he recommends.  Renee Pain

## 2015-04-02 NOTE — Telephone Encounter (Signed)
Patient reports severe N/V and headache with sprinolactone. Per Dr. Aundra Dubin, advised to try to continue med for a few more days.  If symptoms do not subside, ok to stop medication and to call and let us know. Patient aware and agreeable.  [name]

## 2015-04-02 NOTE — Telephone Encounter (Signed)
Ok, would make sure she tried for 4-5 days, if symptoms continue after a few days would stop it.

## 2015-04-14 ENCOUNTER — Other Ambulatory Visit: Payer: Self-pay | Admitting: Gastroenterology

## 2015-04-15 ENCOUNTER — Encounter: Payer: Self-pay | Admitting: Internal Medicine

## 2015-04-15 ENCOUNTER — Ambulatory Visit (HOSPITAL_COMMUNITY)
Admission: RE | Admit: 2015-04-15 | Discharge: 2015-04-15 | Disposition: A | Discharge status: Home / Self Care | Payer: Commercial Managed Care - HMO | Source: Ambulatory Visit | Attending: Cardiology | Admitting: Cardiology

## 2015-04-15 ENCOUNTER — Other Ambulatory Visit (INDEPENDENT_AMBULATORY_CARE_PROVIDER_SITE_OTHER): Payer: Commercial Managed Care - HMO | Admitting: *Deleted

## 2015-04-15 ENCOUNTER — Ambulatory Visit (INDEPENDENT_AMBULATORY_CARE_PROVIDER_SITE_OTHER): Payer: Commercial Managed Care - HMO | Admitting: Internal Medicine

## 2015-04-15 ENCOUNTER — Ambulatory Visit (INDEPENDENT_AMBULATORY_CARE_PROVIDER_SITE_OTHER): Payer: Commercial Managed Care - HMO | Admitting: Pharmacist

## 2015-04-15 VITALS — BP 100/64 | HR 66 | Temp 97.7°F | Resp 12 | Wt 187.8 lb

## 2015-04-15 DIAGNOSIS — Z794 Long term (current) use of insulin: Secondary | ICD-10-CM | POA: Diagnosis not present

## 2015-04-15 DIAGNOSIS — I5022 Chronic systolic (congestive) heart failure: Secondary | ICD-10-CM | POA: Diagnosis present

## 2015-04-15 DIAGNOSIS — I480 Paroxysmal atrial fibrillation: Secondary | ICD-10-CM

## 2015-04-15 DIAGNOSIS — E1165 Type 2 diabetes mellitus with hyperglycemia: Secondary | ICD-10-CM | POA: Diagnosis not present

## 2015-04-15 DIAGNOSIS — E1151 Type 2 diabetes mellitus with diabetic peripheral angiopathy without gangrene: Secondary | ICD-10-CM

## 2015-04-15 DIAGNOSIS — Z7901 Long term (current) use of anticoagulants: Secondary | ICD-10-CM | POA: Diagnosis not present

## 2015-04-15 DIAGNOSIS — IMO0002 Reserved for concepts with insufficient information to code with codable children: Secondary | ICD-10-CM

## 2015-04-15 DIAGNOSIS — Z5181 Encounter for therapeutic drug level monitoring: Secondary | ICD-10-CM | POA: Diagnosis not present

## 2015-04-15 DIAGNOSIS — I4891 Unspecified atrial fibrillation: Secondary | ICD-10-CM | POA: Diagnosis not present

## 2015-04-15 LAB — BASIC METABOLIC PANEL
ANION GAP: 12 (ref 5–15)
BUN: 27 mg/dL — ABNORMAL HIGH (ref 6–20)
CALCIUM: 9.2 mg/dL (ref 8.9–10.3)
CO2: 24 mmol/L (ref 22–32)
Chloride: 105 mmol/L (ref 101–111)
Creatinine, Ser: 1.32 mg/dL — ABNORMAL HIGH (ref 0.44–1.00)
GFR, EST AFRICAN AMERICAN: 47 mL/min — AB (ref >60)
GFR, EST NON AFRICAN AMERICAN: 40 mL/min — AB (ref >60)
Glucose, Bld: 160 mg/dL — ABNORMAL HIGH (ref 65–99)
POTASSIUM: 4.5 mmol/L (ref 3.5–5.1)
Sodium: 141 mmol/L (ref 135–145)

## 2015-04-15 LAB — POCT GLYCOSYLATED HEMOGLOBIN (HGB A1C): HEMOGLOBIN A1C: 6.7

## 2015-04-15 LAB — POCT INR: INR: 1.8

## 2015-04-15 NOTE — Patient Instructions (Signed)
Please continue: - Metformin XR 500 mg 2x a day - Levemir 25 units at bedtime. - Mealtime Humalog doses:   - 6 units before a small meal - 8 units before a large meal (can use 10 units if an extra large meal or if you have dessert) - Humalog Sliding scale: - 150-175: + 1 unit  - 176-200: + 2 units  - 201-225: + 3 units  - > 226: + 4 units   Please return in 3 months with your sugar log

## 2015-04-15 NOTE — Progress Notes (Signed)
Patient ID: [name], female   DOB: Dec 01, 1945, 70 y.o.   MRN: [id number]   HPI: [name] is a 70 y.o.-year-old female, returning for f/u for DM2, insulin-dependent, uncontrolled, with complications (CAD, ICM - had ICD, peripheral neuropathy, gastroparesis). Last visit 3 mo ago.  Last hemoglobin A1c was: Lab Results  Component Value Date   HGBA1C 6.5 12/21/2014   HGBA1C 6.9* 07/19/2014   HGBA1C 8.2* 04/19/2014  07/03/2013: 7.2% - through her insurance   Pt is on a regimen of: - Levemir 25 units at bedtime.  - Mealtime Humalog doses:   - 6 units before a small meal - 8 units before a large meal - Humalog Sliding scale: - 150-175: + 1 unit  - 176-200: + 2 units  - 201-225: + 3 units  - > 226: + 4 units  - Metformin XR 500 mg 2x a day  Pt checks her sugars 3-4 a day and they are still good, but still fluctuating- brings a great log: - am: 125-219, 246 (higher values after cough syrup at night) >> 72-140, 157 >> 50-145, 167, 205 >> 80-166, 182 - after b'fast: 175 - before lunch: 58x1, 75,111-183 >> 51-140, 180 >> 50-118, 170, 298 >> 50-166, 184 >> 58, 90-150, 160 - after lunch: 55-171 >> n/c >> 100 >> n/c >> n/c >> 57x1 >> n/c - before dinner: 78-293 >> 61-160, 241 >> 53, 69-152, 222 >> 90-160, 170 - bedtime: 79-200, 220 x 1, 237, 307 x1 >> 60, 101-188, 241 >> 74-180, some 200s >> 100-240 >> 52-191, 255 >> 75, 93, 145-190, 218, 288 Lowest sugar was 44 >> 58  she has hypoglycemia awareness at 70. Highest sugar was 297 >> 288.  Pt's meals are: - Breakfast: small bowl of cereal frosted with 2% milk - Lunch (5-14 units): vegetable + starch; turkey/ham sandwich - Dinner (20 units): fast food or cooks: meat + vegetables + sometimes potatoes - Snacks: fruit (apple) Did not see nutrition yet >> did not have transportation She has a glass of 2% milk before bedtime. Tried Almond milk but did not like it as it was too sweet.  - Has CKD, last BUN/creatinine:  Lab Results   Component Value Date   BUN 27* 04/15/2015   CREATININE 1.32* 04/15/2015  ACR in 12/15/2012: 1.0.  On Lisinopril. - last set of lipids: Lab Results  Component Value Date   CHOL 135 12/21/2014   HDL 54.20 12/21/2014   LDLCALC 64 12/21/2014   TRIG 87.0 12/21/2014   CHOLHDL 2 12/21/2014  she is on Crestor. - last eye exam was in 03/2015. No DR. - + numbness and tingling in her feet - on neurontin. Foot exam performed 07/19/2014 >> neuropathy.  I reviewed pt's medications, allergies, PMH, social hx, family hx, and changes were documented in the history of present illness. Otherwise, unchanged from my initial visit note.  ROS: Constitutional: no weight loss/gain, no fatigue, no subjective hyperthermia/hypothermia Eyes: no blurry vision, no xerophthalmia ENT: no sore throat, no nodules palpated in throat, no dysphagia/odynophagia, + tinnitus Cardiovascular: no CP/SOB/no palpitations/+ leg swelling Respiratory: no cough/SOB Gastrointestinal: no N/V/D/C Musculoskeletal: no muscle/joint aches and swelling Skin: no rashes  PE: BP 100/64 mmHg  Pulse 66  Temp(Src) 97.7 F (36.5 C) (Oral)  Resp 12  Wt 187 lb 12.8 oz (85.186 kg)  SpO2 95% Body mass index is 33.28 kg/(m^2).  Wt Readings from Last 3 Encounters:  04/15/15 187 lb 12.8 oz (85.186 kg)  03/29/15 193 lb (  87.544 kg)  12/25/14 186 lb (84.369 kg)   Constitutional: overweight, in NAD, full supraclavicular fat pads Eyes: PERRLA, EOMI, no exophthalmos ENT: moist mucous membranes, no thyromegaly, no cervical lymphadenopathy Cardiovascular: RRR, + SEM +1/6, no RG, + nonpitting LE edema Respiratory: CTA B Gastrointestinal: abdomen soft, NT, ND, BS+ Musculoskeletal: no deformities, strength intact in all 4 Skin: moist, warm, no rashes  ASSESSMENT: 1. DM2, insulin-dependent, controlled, with complications - CAD, ICM - s/p ICD placement - CKD - PN - on neurontin - gastroparesis per GES 06/18/2012 >> 60 minutes: 92%, 120  minutes: 82%  PLAN:  1. Patient with long-standing, now controlled diabetes, on basal bolus regimen, with much improved sugars after starting Metformin XR, and last HbA1c excellent, at 6.5%. We were able to reduce Levemir dose from 75 units a day to 25 units a day, with improvement in her HbA1c! - I suggested to: Patient Instructions  Please continue: - Metformin XR 500 mg 2x a day - Levemir 25 units at bedtime. - Mealtime Humalog doses:   - 6 units before a small meal - 8 units before a large meal - Humalog Sliding scale: - 150-175: + 1 unit  - 176-200: + 2 units  - 201-225: + 3 units  - > 226: + 4 units   Please return in 3 months with your sugar log.   - continue checking her sugars at different times of the day - check 4 times a day - she does a great job with this! - check HbA1c today >> 6.7% (great!) - UTD with eye exams  Return in about 3 months (around 07/14/2015).

## 2015-04-20 ENCOUNTER — Other Ambulatory Visit: Payer: Self-pay | Admitting: Internal Medicine

## 2015-04-24 ENCOUNTER — Other Ambulatory Visit (HOSPITAL_COMMUNITY): Payer: Self-pay | Admitting: *Deleted

## 2015-04-24 MED ORDER — WARFARIN SODIUM 1 MG PO TABS: ORAL_TABLET | ORAL | Status: DC

## 2015-05-06 ENCOUNTER — Encounter: Payer: Self-pay | Admitting: Internal Medicine

## 2015-05-06 ENCOUNTER — Ambulatory Visit (INDEPENDENT_AMBULATORY_CARE_PROVIDER_SITE_OTHER): Payer: Commercial Managed Care - HMO | Admitting: *Deleted

## 2015-05-06 ENCOUNTER — Ambulatory Visit (INDEPENDENT_AMBULATORY_CARE_PROVIDER_SITE_OTHER): Payer: Commercial Managed Care - HMO | Admitting: Internal Medicine

## 2015-05-06 ENCOUNTER — Telehealth (HOSPITAL_COMMUNITY): Payer: Self-pay | Admitting: Vascular Surgery

## 2015-05-06 VITALS — BP 82/52 | HR 66 | Ht 63.0 in | Wt 191.8 lb

## 2015-05-06 DIAGNOSIS — I4729 Other ventricular tachycardia: Secondary | ICD-10-CM

## 2015-05-06 DIAGNOSIS — I472 Ventricular tachycardia: Secondary | ICD-10-CM | POA: Diagnosis not present

## 2015-05-06 DIAGNOSIS — Z5181 Encounter for therapeutic drug level monitoring: Secondary | ICD-10-CM

## 2015-05-06 DIAGNOSIS — I480 Paroxysmal atrial fibrillation: Secondary | ICD-10-CM

## 2015-05-06 DIAGNOSIS — I255 Ischemic cardiomyopathy: Secondary | ICD-10-CM | POA: Diagnosis not present

## 2015-05-06 DIAGNOSIS — I4891 Unspecified atrial fibrillation: Secondary | ICD-10-CM | POA: Diagnosis not present

## 2015-05-06 DIAGNOSIS — Z7901 Long term (current) use of anticoagulants: Secondary | ICD-10-CM | POA: Diagnosis not present

## 2015-05-06 DIAGNOSIS — I959 Hypotension, unspecified: Secondary | ICD-10-CM | POA: Insufficient documentation

## 2015-05-06 DIAGNOSIS — I952 Hypotension due to drugs: Secondary | ICD-10-CM

## 2015-05-06 DIAGNOSIS — I5022 Chronic systolic (congestive) heart failure: Secondary | ICD-10-CM | POA: Diagnosis not present

## 2015-05-06 LAB — CBC WITH DIFFERENTIAL/PLATELET
Basophils Absolute: 0 10*3/uL (ref 0.0–0.1)
Basophils Relative: 0 % (ref 0–1)
Eosinophils Absolute: 0.1 10*3/uL (ref 0.0–0.7)
Eosinophils Relative: 1 % (ref 0–5)
HEMATOCRIT: 38.6 % (ref 36.0–46.0)
HEMOGLOBIN: 12.6 g/dL (ref 12.0–15.0)
LYMPHS PCT: 23 % (ref 12–46)
Lymphs Abs: 1.8 10*3/uL (ref 0.7–4.0)
MCH: 30.6 pg (ref 26.0–34.0)
MCHC: 32.6 g/dL (ref 30.0–36.0)
MCV: 93.7 fL (ref 78.0–100.0)
MONO ABS: 0.6 10*3/uL (ref 0.1–1.0)
MONOS PCT: 8 % (ref 3–12)
MPV: 11.3 fL (ref 8.6–12.4)
NEUTROS ABS: 5.3 10*3/uL (ref 1.7–7.7)
Neutrophils Relative %: 68 % (ref 43–77)
Platelets: 235 10*3/uL (ref 150–400)
RBC: 4.12 MIL/uL (ref 3.87–5.11)
RDW: 13.8 % (ref 11.5–15.5)
WBC: 7.8 10*3/uL (ref 4.0–10.5)

## 2015-05-06 LAB — BASIC METABOLIC PANEL
BUN: 52 mg/dL — ABNORMAL HIGH (ref 7–25)
CO2: 26 mmol/L (ref 20–31)
Calcium: 9 mg/dL (ref 8.6–10.4)
Chloride: 104 mmol/L (ref 98–110)
Creat: 1.76 mg/dL — ABNORMAL HIGH (ref 0.50–0.99)
GLUCOSE: 78 mg/dL (ref 65–99)
POTASSIUM: 4.9 mmol/L (ref 3.5–5.3)
SODIUM: 141 mmol/L (ref 135–146)

## 2015-05-06 LAB — CUP PACEART INCLINIC DEVICE CHECK
Battery Remaining Longevity: 51.6
Brady Statistic RV Percent Paced: 2.2 %
Date Time Interrogation Session: 20170213122014
HighPow Impedance: 47.6497
Implantable Lead Implant Date: 20070117
Implantable Lead Location: 753860
Implantable Lead Model: 7040
Lead Channel Impedance Value: 687.5 Ohm
Lead Channel Pacing Threshold Amplitude: 0.75 V
Lead Channel Pacing Threshold Pulse Width: 0.5 ms
Lead Channel Pacing Threshold Pulse Width: 0.5 ms
Lead Channel Pacing Threshold Pulse Width: 0.5 ms
Lead Channel Sensing Intrinsic Amplitude: 11.8 mV
Lead Channel Setting Pacing Amplitude: 2 V
Lead Channel Setting Pacing Amplitude: 2.5 V
Lead Channel Setting Sensing Sensitivity: 0.5 mV
MDC IDC LEAD IMPLANT DT: 20070117
MDC IDC LEAD LOCATION: 753859
MDC IDC MSMT LEADCHNL RA IMPEDANCE VALUE: 450 Ohm
MDC IDC MSMT LEADCHNL RA PACING THRESHOLD AMPLITUDE: 0.75 V
MDC IDC MSMT LEADCHNL RA SENSING INTR AMPL: 3.7 mV
MDC IDC MSMT LEADCHNL RV PACING THRESHOLD AMPLITUDE: 1 V
MDC IDC MSMT LEADCHNL RV PACING THRESHOLD AMPLITUDE: 1 V
MDC IDC MSMT LEADCHNL RV PACING THRESHOLD PULSEWIDTH: 0.5 ms
MDC IDC SET LEADCHNL RV PACING PULSEWIDTH: 0.5 ms
MDC IDC STAT BRADY RA PERCENT PACED: 96 %
Pulse Gen Serial Number: 819806

## 2015-05-06 LAB — POCT INR: INR: 2

## 2015-05-06 MED ORDER — CARVEDILOL 25 MG PO TABS: 12.5000 mg | ORAL_TABLET | Freq: Two times a day (BID) | ORAL | Status: DC

## 2015-05-06 NOTE — Progress Notes (Signed)
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 female who presents today for routine electrophysiology followup.  Since last being seen in our clinic, the patient reports doing very well.  She has some fatigue.  Denies dizziness.   Today, she denies symptoms of palpitations, chest pain, shortness of breath,  lower extremity edema, dizziness, presyncope, syncope, or ICD shocks.  The patient is otherwise without complaint today.   Past Medical History  Diagnosis Date  . Chronic systolic heart failure ([place])   . Diabetes mellitus     type 1  . Hypertension   . Stroke [facility]) 2010    eye doctor said she had TIA  . Ischemic cardiomyopathy   . Ventricular tachycardia ([facility])     Polymorphic  . 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   . Atrial fibrillation ([place])     controlled with amiodarone, on coumadin  . Coronary artery disease 01/31/2011  . Chronic renal insufficiency   . Lumbar spondylosis 01/11/2012  . History of chicken pox   . Allergy   . Hyperlipidemia   . Arthritis   . Congestive heart failure [facility])    Past Surgical History  Procedure Laterality Date  . Abdominal hysterectomy  1995  . Cholecystectomy  1985  . Tubaligation  1980  . Icd  2003/2007    implanted by Dr Rollene Fare, most recent generator change 2/13 by Dr Rayann Heman, Analyze ST study patient  . Cardiac defibrillator placement    . Left heart catheterization with coronary angiogram N/A 01/30/2011    Procedure: LEFT HEART CATHETERIZATION WITH CORONARY ANGIOGRAM;  Surgeon: Larey Dresser, MD;  Location[facility] LAB;  Service: Cardiovascular;  Laterality: N/A;  . Implantable cardioverter defibrillator (icd) generator change N/A 05/05/2011    Procedure: ICD GENERATOR CHANGE;  Surgeon: Thompson Grayer, MD;  Location: [facility] CATH LAB;  Service: Cardiovascular;  Laterality: N/A;    Current Outpatient Prescriptions  Medication Sig Dispense Refill  . amiodarone (PACERONE) 200 MG tablet  TAKE 1 TABLET (200 MG TOTAL) BY MOUTH DAILY. 60 tablet 2  . Blood Glucose Monitoring Suppl (ACCU-CHEK NANO SMARTVIEW) W/DEVICE KIT Use to test blood sugar 4 times daily as instructed. Dx code: E11.59 1 kit 0  . carvedilol (COREG) 25 MG tablet TAKE 1 TABLET BY MOUTH TWICE A DAY WITH A MEAL 60 tablet 3  . Cholecalciferol (VITAMIN D3) 2000 UNITS TABS Take 2,000 Units by mouth daily.     . furosemide (LASIX) 20 MG tablet TAKE 1 TABLET (20 MG TOTAL) BY MOUTH DAILY. 30 tablet 8  . gabapentin (NEURONTIN) 400 MG capsule TAKE 1 CAPSULE (400 MG TOTAL) BY MOUTH 2 (TWO) TIMES DAILY. 180 capsule 0  . glucose blood (ACCU-CHEK SMARTVIEW) test strip Use to test blood sugar 4 times daily as instructed. Dx code E11.59 200 each 11  . glycopyrrolate (ROBINUL) 1 MG tablet TAKE 1 TABLET (1 MG TOTAL) BY MOUTH 2 (TWO) TIMES DAILY. 60 tablet 1  . Insulin Detemir (LEVEMIR FLEXTOUCH) 100 UNIT/ML Pen Inject under skin 25 units at bedtime 45 mL 1  . insulin lispro (HUMALOG) 100 UNIT/ML injection Inject 0.06-0.12 mLs (6-12 Units total) into the skin 3 (three) times daily before meals. 20 mL 2  . lisinopril (PRINIVIL,ZESTRIL) 5 MG tablet TAKE 1 TABLET BY MOUTH DAILY 30 tablet 6  . metFORMIN (GLUCOPHAGE-XR) 500 MG 24 hr tablet TAKE 1 TABLET (500 MG TOTAL) BY MOUTH 2 (TWO) TIMES DAILY WITH A MEAL. 120 tablet 0  .  montelukast (SINGULAIR) 10 MG tablet Take 1 tablet (10 mg total) by mouth daily. 90 tablet 3  . Multiple Vitamins-Minerals (EYE VITAMINS PO) Take 1 tablet by mouth 2 (two) times daily.     . nitroGLYCERIN (NITROSTAT) 0.4 MG SL tablet Place 1 tablet (0.4 mg total) under the tongue every 5 (five) minutes as needed for chest pain. 25 tablet 1  . nizatidine (AXID) 150 MG capsule Take 1 capsule (150 mg total) by mouth daily. 90 capsule 3  . rosuvastatin (CRESTOR) 20 MG tablet Take 20 mg by mouth daily.    . traMADol (ULTRAM) 50 MG tablet Take 50 mg by mouth every 6 (six) hours as needed (pain).    [name] venlafaxine (EFFEXOR) 37.5  MG tablet TAKE 1 TABLET BY MOUTH 2 (TWO) TIMES DAILY. 60 tablet 3  . Vitamin D, Ergocalciferol, (DRISDOL) 50000 units CAPS capsule Take 1 capsule (50,000 Units total) by mouth once a week. 12 capsule 3  . warfarin (COUMADIN) 1 MG tablet TAKE AS DIRECTED BY COUMADIN CLINIC 40 tablet 3   No current facility-administered medications for this visit.   ROS- all systems are reviewed and negative except as per HPI above  Physical Exam: Filed Vitals:   05/06/15 1204  BP: 82/52  Pulse: 66  Height: '5\' 3"'$  (1.6 m)  Weight: 191 lb 12.8 oz (87 kg)    GEN- The patient is well appearing, alert and oriented x 3 today.   Head- normocephalic, atraumatic Eyes-  Sclera clear, conjunctiva pink Ears- hearing intact Oropharynx- clear Lungs- Clear to ausculation bilaterally, normal work of breathing Chest- ICD pocket is well healed Heart- Regular rate and rhythm, no murmurs, rubs or gallops GI- soft, NT, ND, + BS Extremities- no clubbing, cyanosis, or edema  ICD interrogation- reviewed in detail today,  See PACEART report ekg today reveals atrial pacing, PR 218, QRS 106, Qtc 438 msec, inferior infarction, nonspecific St/T changes  Assessment and Plan:  1.  Chronic systolic dysfunction euvolemic today Stable on an appropriate medical regimen but hypotensive with symptoms today I have discussed with Dr Aundra Dubin.  Repeat bp by me is 70/45.  Will check cbc and bmet today to evaluate for metabolic cause.  Does not look anemic and is not bleeding by history.  Will reduce coreg to 12.'5mg'$  BID and hold lasix x 3 days.  Will need follow-up later this week or early next week with Dr Oleh Genin nurse for repeat bp check.  She is aware to go to the ER for any symptoms of hypotension.  Normal ICD function See Pace Art report No changes today  I have discussed SJM Fortify Assura advisary with the patient today. She understands that recommendation from SJM is to not replace the device at this time. The patient is not  device dependant.  The patient has had appropriate device therapy in the past or implanted for secondary prevention.  Vibratory alert demonstrated today.  She is actively remotely monitored and understands the importance of compliance today.    2. VT Controlled with amiodarone Labs reviewed from 1/17  3. AF Controlled Continue long term anticoagulation  chads2vasc score is 8 Continue life long anticoagulation  Follow-up with Dr Aundra Dubin as scheduled [place]  Pt has multiple comorbidities and is at risk for decompensation/ hospitalization due to hypotension.  A high level of decision making was required for this visit today.  Thompson Grayer, MD 05/06/2015 12:24 PM

## 2015-05-06 NOTE — Patient Instructions (Addendum)
Medication Instructions:  Your physician has recommended you make the following change in your medication:  1) Decrease Carvedilol to 12.5 mg twice daily 2) HOLD Furosemide for 3 days     Labwork: Your physician recommends that you return for lab work today: BMP/CBC    Testing/Procedures: None ordered   Follow-Up: Your physician recommends that you schedule a follow-up appointment later this week for BP check in CHF clinic---Dr Aundra Dubin aware Please call [phone] to arrange   Your physician wants you to follow-up in: 12 months with Chanetta Marshall, NP You will receive a reminder letter in the mail two months in advance. If you don't receive a letter, please call our office to schedule the follow-up appointment.  Remote monitoring is used to monitor your  ICD from home. This monitoring reduces the number of office visits required to check your device to one time per year. It allows [name] to keep an eye on the functioning of your device to ensure it is working properly. You are scheduled for a device check from home on 08/05/15. You may send your transmission at any time that day. If you have a wireless device, the transmission will be sent automatically. After your physician reviews your transmission, you will receive a postcard with your next transmission date.     Any Other Special Instructions Will Be Listed Below (If Applicable).     If you need a refill on your cardiac medications before your next appointment, please call your pharmacy.

## 2015-05-06 NOTE — Telephone Encounter (Signed)
Left pt message to make add on appt w/ [facility] next week

## 2015-05-07 ENCOUNTER — Other Ambulatory Visit (HOSPITAL_COMMUNITY): Payer: Self-pay | Admitting: *Deleted

## 2015-05-07 MED ORDER — LISINOPRIL 5 MG PO TABS: 5.0000 mg | ORAL_TABLET | Freq: Every day | ORAL | Status: DC

## 2015-05-08 ENCOUNTER — Other Ambulatory Visit (HOSPITAL_COMMUNITY): Payer: Self-pay | Admitting: *Deleted

## 2015-05-24 ENCOUNTER — Other Ambulatory Visit: Payer: Self-pay | Admitting: Internal Medicine

## 2015-05-24 MED ORDER — INSULIN LISPRO 100 UNIT/ML ~~LOC~~ SOLN: 6.0000 [IU] | Freq: Three times a day (TID) | SUBCUTANEOUS | Status: DC

## 2015-05-24 NOTE — Telephone Encounter (Signed)
PT called and said she needs a new Rx for Humalog sent into [facility] in [place]

## 2015-05-24 NOTE — Telephone Encounter (Signed)
Refill sent to pt's pharmacy. 

## 2015-05-27 ENCOUNTER — Encounter (HOSPITAL_COMMUNITY): Payer: Self-pay | Admitting: Vascular Surgery

## 2015-05-28 ENCOUNTER — Telehealth: Payer: Self-pay | Admitting: Internal Medicine

## 2015-05-28 MED ORDER — INSULIN ASPART 100 UNIT/ML ~~LOC~~ SOLN: 6.0000 [IU] | Freq: Three times a day (TID) | SUBCUTANEOUS | Status: DC

## 2015-05-28 NOTE — Telephone Encounter (Signed)
Yes, OK 

## 2015-05-28 NOTE — Telephone Encounter (Signed)
Returned pt's call. Pt stated that her ins does not cover Humalog any longer. They cover Novolog. Ok to switch to [employer]? Please advise.

## 2015-05-28 NOTE — Telephone Encounter (Signed)
Pt called and requested that you call her back, didn't specify why

## 2015-05-31 ENCOUNTER — Other Ambulatory Visit: Payer: Self-pay | Admitting: Internal Medicine

## 2015-06-20 ENCOUNTER — Other Ambulatory Visit: Payer: Self-pay | Admitting: Gastroenterology

## 2015-07-15 DIAGNOSIS — E119 Type 2 diabetes mellitus without complications: Secondary | ICD-10-CM | POA: Diagnosis not present

## 2015-07-15 DIAGNOSIS — Z950 Presence of cardiac pacemaker: Secondary | ICD-10-CM | POA: Diagnosis not present

## 2015-07-15 DIAGNOSIS — G8929 Other chronic pain: Secondary | ICD-10-CM | POA: Diagnosis not present

## 2015-07-15 DIAGNOSIS — M1990 Unspecified osteoarthritis, unspecified site: Secondary | ICD-10-CM | POA: Diagnosis not present

## 2015-07-15 DIAGNOSIS — M25562 Pain in left knee: Secondary | ICD-10-CM | POA: Diagnosis not present

## 2015-07-15 DIAGNOSIS — Z79899 Other long term (current) drug therapy: Secondary | ICD-10-CM | POA: Diagnosis not present

## 2015-07-15 DIAGNOSIS — M25561 Pain in right knee: Secondary | ICD-10-CM | POA: Diagnosis not present

## 2015-07-15 DIAGNOSIS — M170 Bilateral primary osteoarthritis of knee: Secondary | ICD-10-CM | POA: Diagnosis not present

## 2015-07-15 DIAGNOSIS — I2510 Atherosclerotic heart disease of native coronary artery without angina pectoris: Secondary | ICD-10-CM | POA: Diagnosis not present

## 2015-07-15 DIAGNOSIS — I10 Essential (primary) hypertension: Secondary | ICD-10-CM | POA: Diagnosis not present

## 2015-07-16 ENCOUNTER — Ambulatory Visit: Payer: Commercial Managed Care - HMO | Admitting: Internal Medicine

## 2015-07-24 DIAGNOSIS — I2510 Atherosclerotic heart disease of native coronary artery without angina pectoris: Secondary | ICD-10-CM | POA: Diagnosis not present

## 2015-07-24 DIAGNOSIS — E785 Hyperlipidemia, unspecified: Secondary | ICD-10-CM | POA: Diagnosis not present

## 2015-07-24 DIAGNOSIS — I4891 Unspecified atrial fibrillation: Secondary | ICD-10-CM | POA: Diagnosis not present

## 2015-07-24 DIAGNOSIS — E119 Type 2 diabetes mellitus without complications: Secondary | ICD-10-CM | POA: Diagnosis not present

## 2015-07-30 ENCOUNTER — Ambulatory Visit (HOSPITAL_COMMUNITY)
Admission: RE | Admit: 2015-07-30 | Discharge: 2015-07-30 | Disposition: A | Discharge status: Home / Self Care | Payer: Medicare PPO | Source: Ambulatory Visit | Attending: Cardiology | Admitting: Cardiology

## 2015-07-30 ENCOUNTER — Ambulatory Visit: Payer: Medicare HMO | Admitting: Family Medicine

## 2015-07-30 VITALS — BP 108/60 | HR 103 | Wt 193.2 lb

## 2015-07-30 DIAGNOSIS — Z87891 Personal history of nicotine dependence: Secondary | ICD-10-CM | POA: Diagnosis not present

## 2015-07-30 DIAGNOSIS — I255 Ischemic cardiomyopathy: Secondary | ICD-10-CM | POA: Insufficient documentation

## 2015-07-30 DIAGNOSIS — E1122 Type 2 diabetes mellitus with diabetic chronic kidney disease: Secondary | ICD-10-CM | POA: Diagnosis not present

## 2015-07-30 DIAGNOSIS — Z8673 Personal history of transient ischemic attack (TIA), and cerebral infarction without residual deficits: Secondary | ICD-10-CM | POA: Insufficient documentation

## 2015-07-30 DIAGNOSIS — N183 Chronic kidney disease, stage 3 (moderate): Secondary | ICD-10-CM | POA: Diagnosis not present

## 2015-07-30 DIAGNOSIS — I480 Paroxysmal atrial fibrillation: Secondary | ICD-10-CM | POA: Insufficient documentation

## 2015-07-30 DIAGNOSIS — I351 Nonrheumatic aortic (valve) insufficiency: Secondary | ICD-10-CM | POA: Insufficient documentation

## 2015-07-30 DIAGNOSIS — I5022 Chronic systolic (congestive) heart failure: Secondary | ICD-10-CM | POA: Diagnosis not present

## 2015-07-30 DIAGNOSIS — I2510 Atherosclerotic heart disease of native coronary artery without angina pectoris: Secondary | ICD-10-CM | POA: Diagnosis not present

## 2015-07-30 DIAGNOSIS — Z79899 Other long term (current) drug therapy: Secondary | ICD-10-CM | POA: Insufficient documentation

## 2015-07-30 DIAGNOSIS — Z794 Long term (current) use of insulin: Secondary | ICD-10-CM | POA: Diagnosis not present

## 2015-07-30 DIAGNOSIS — Z955 Presence of coronary angioplasty implant and graft: Secondary | ICD-10-CM | POA: Insufficient documentation

## 2015-07-30 DIAGNOSIS — I130 Hypertensive heart and chronic kidney disease with heart failure and stage 1 through stage 4 chronic kidney disease, or unspecified chronic kidney disease: Secondary | ICD-10-CM | POA: Diagnosis not present

## 2015-07-30 DIAGNOSIS — Z8249 Family history of ischemic heart disease and other diseases of the circulatory system: Secondary | ICD-10-CM | POA: Diagnosis not present

## 2015-07-30 DIAGNOSIS — I472 Ventricular tachycardia: Secondary | ICD-10-CM | POA: Diagnosis not present

## 2015-07-30 DIAGNOSIS — Z7901 Long term (current) use of anticoagulants: Secondary | ICD-10-CM | POA: Insufficient documentation

## 2015-07-30 LAB — COMPREHENSIVE METABOLIC PANEL
ALK PHOS: 61 U/L (ref 38–126)
ALT: 18 U/L (ref 14–54)
ANION GAP: 13 (ref 5–15)
AST: 21 U/L (ref 15–41)
Albumin: 3.5 g/dL (ref 3.5–5.0)
BILIRUBIN TOTAL: 0.8 mg/dL (ref 0.3–1.2)
BUN: 33 mg/dL — ABNORMAL HIGH (ref 6–20)
CALCIUM: 9.2 mg/dL (ref 8.9–10.3)
CO2: 23 mmol/L (ref 22–32)
CREATININE: 1.88 mg/dL — AB (ref 0.44–1.00)
Chloride: 106 mmol/L (ref 101–111)
GFR, EST AFRICAN AMERICAN: 31 mL/min — AB (ref >60)
GFR, EST NON AFRICAN AMERICAN: 26 mL/min — AB (ref >60)
Glucose, Bld: 129 mg/dL — ABNORMAL HIGH (ref 65–99)
Potassium: 4.5 mmol/L (ref 3.5–5.1)
SODIUM: 142 mmol/L (ref 135–145)
TOTAL PROTEIN: 6.7 g/dL (ref 6.5–8.1)

## 2015-07-30 LAB — TSH: TSH: 2.745 u[IU]/mL (ref 0.350–4.500)

## 2015-07-30 NOTE — Progress Notes (Signed)
Patient ID: [name], female   DOB: Jul 06, 1945, 70 y.o.   MRN: [id number] PCP: Dr Jenny Reichmann Cardiology: Dr. Aundra Dubin  70 yo with history of CAD, ischemic cardiomyopathy, and atrial fibrillation presents for cardiology followup.  Patient was hospitalized in 11/12 at [facility] for VT with ICD discharge.  She had a left heart cath showing patent stents.  EF was 35-40% by echo.  She is on amiodarone.  She has had periodic problems with creatinine rising with medication adjustments.  Most recent echo in 6/16 showed EF 40-45%.   Since I last saw her, she was noted to have BP running low.  Coreg was decreased to 12.5 mg bid. She was unable to take spironolactone due to nausea.  She is now living in [place] with her daughter but continues to see doctors in [place].   Weight is stable. Feeling good overall.  Some dyspnea with stairs but no problems walking on flat ground.  Not very active because of bilateral knee pain.  No orthopnea/PND.  No palpitations.  No melena/BRBPR.  She is not in atrial fibrillation.   No chest pain.    ECG: a-paced, LAFB, LVH, inferior Qs  Labs (10/12): LDL 73, HDL 44 Labs (11/12): K 3.9, creatinine 1.1, LFTs normal, TSH normal Labs (12/12): K 3.9, creatinine 1.5, proBNP 18 Labs (1/13): LDL 82, HDL 57 Labs (2/13): K 4.3, creatinine 1.5 Labs (5/13): creatinine 2.4 => 1.7, LFTs normal, TSH normal, proBNP 18 Labs (6/13): K 4.2, creatinine 1.7 Labs (10/13): K 4.3, creatinine 1.4 Labs (4/14); LFTs normal, TSH normal, LDL 71, HDL 58 Labs (5/14): K 4.5, creatinine 1.4 Labs (8/14): TSH normal, LFTs normal Labs (9/14): K 4.2, creatinine 1.8, LDL 75, HDL 54 Labs (07/31/13) K 3.7 Creatinine 1.8 BNP 148  Labs (9/15): K 4.3, creatinine 1.24, LFTs normal, LDL 81, HDL 58, TSH normal Labs (9/16): K 4.4, creatinine 1.44, LDL 64, HDL 54, LFTs normal, TSH normal, HCT 38 Labs (2/17): K 4.9, creatinine 1.76, HCT 38.6  PMH: 1. Diabetes mellitus 2. CVA, TIA in 2010 3. HTN 4. CKD  stage 3 5. H/o TAH 6. H/o CCY 7. Sciatica 8. Atrial fibrillation 9. CAD: s/p LAD and RCA PCI.  Last LHC in 11/12 with patent proximal LAD stent, ostial 70% D1 (jailed by stent), mild LAD stent patent, patent RCA stents, EF 40% with global hypokinesis.  10. Ischemic cardiomyopathy: Echo (10/12): EF 35-40%, moderate focal basal septal hypertrophy, inferior akinesis, grade I diastolic dysfunction, moderate aortic insufficiency. St Jude dual chamber ICD.  Spironolactone stopped when creatinine rose to 2.5.  Echo (5/14) with EF 40-45%, mild AI.  Echo (6/16) with EF 40-45%, inferior/inferoseptal hypokinesis, mild LVH.  11. Aortic insufficiency: moderate in past but mild on most recent echo.   12. 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  13. Gastroparesis  SH: Divorced.  3 children.  Quit smoking in 1997. Lives in [place] now with daughter.  [place].   FH: CAD  ROS: All systems reviewed and negative except as per HPI.   Current Outpatient Prescriptions  Medication Sig Dispense Refill  . amiodarone (PACERONE) 200 MG tablet TAKE 1 TABLET (200 MG TOTAL) BY MOUTH DAILY. 60 tablet 2  . Blood Glucose Monitoring Suppl (ACCU-CHEK NANO SMARTVIEW) W/DEVICE KIT Use to test blood sugar 4 times daily as instructed. Dx code: E11.59 1 kit 0  . carvedilol (COREG) 25 MG tablet Take 0.5 tablets (12.5 mg total) by mouth 2 (two) times daily with a meal. 60 tablet 3  .  furosemide (LASIX) 20 MG tablet TAKE 1 TABLET (20 MG TOTAL) BY MOUTH DAILY. 30 tablet 8  . gabapentin (NEURONTIN) 400 MG capsule TAKE 1 CAPSULE (400 MG TOTAL) BY MOUTH 2 (TWO) TIMES DAILY. 180 capsule 0  . glucose blood (ACCU-CHEK SMARTVIEW) test strip Use to test blood sugar 4 times daily as instructed. Dx code E11.59 200 each 11  . glycopyrrolate (ROBINUL) 1 MG tablet TAKE 1 TABLET (1 MG TOTAL) BY MOUTH 2 (TWO) TIMES DAILY. 60 tablet 1  . insulin aspart (NOVOLOG) 100 UNIT/ML injection Inject 6-12 Units into the skin 3 (three) times daily before meals.  10 mL 2  . Insulin Detemir (LEVEMIR FLEXTOUCH) 100 UNIT/ML Pen Inject under skin 25 units at bedtime 45 mL 1  . lisinopril (PRINIVIL,ZESTRIL) 5 MG tablet Take 1 tablet (5 mg total) by mouth daily. 90 tablet 3  . metFORMIN (GLUCOPHAGE-XR) 500 MG 24 hr tablet TAKE 1 TABLET (500 MG TOTAL) BY MOUTH 2 (TWO) TIMES DAILY WITH A MEAL. 180 tablet 0  . montelukast (SINGULAIR) 10 MG tablet Take 1 tablet (10 mg total) by mouth daily. 90 tablet 3  . Multiple Vitamins-Minerals (EYE VITAMINS PO) Take 1 tablet by mouth 2 (two) times daily.     . nitroGLYCERIN (NITROSTAT) 0.4 MG SL tablet Place 1 tablet (0.4 mg total) under the tongue every 5 (five) minutes as needed for chest pain. 25 tablet 1  . nizatidine (AXID) 150 MG capsule TAKE 1 CAPSULE (150 MG TOTAL) BY MOUTH DAILY. 90 capsule 0  . omeprazole (PRILOSEC) 20 MG capsule TAKE 1 CAPSULE BY MOUTH DAILY. 30 capsule 2  . rosuvastatin (CRESTOR) 20 MG tablet Take 20 mg by mouth daily.    . traMADol (ULTRAM) 50 MG tablet Take 50 mg by mouth every 6 (six) hours as needed (pain).    [name] venlafaxine (EFFEXOR) 37.5 MG tablet TAKE 1 TABLET BY MOUTH 2 (TWO) TIMES DAILY. 60 tablet 3  . Vitamin D, Ergocalciferol, (DRISDOL) 50000 units CAPS capsule Take 1 capsule (50,000 Units total) by mouth once a week. 12 capsule 3  . warfarin (COUMADIN) 1 MG tablet TAKE AS DIRECTED BY COUMADIN CLINIC 40 tablet 3  . Cholecalciferol (VITAMIN D3) 2000 UNITS TABS Take 2,000 Units by mouth daily.      No current facility-administered medications for this encounter.    BP 108/60 mmHg  Pulse 103  Wt 193 lb 4 oz (87.658 kg)  SpO2 92% General: NAD, obese Neck: Thick, JVP 5-6 cm, no thyromegaly or thyroid nodule.  Lungs: Clear to auscultation bilaterally with normal respiratory effort. CV: Nondisplaced PMI.  Heart regular S1/S2, no S3/S4, 2/6 early SEM.  Trace ankle edema.  No carotid bruit.  Normal pedal pulses.  Abdomen: Obese, Soft, nontender, no hepatosplenomegaly, no distention.   Neurologic: Alert and oriented x 3.  Psych: Normal affect. Extremities: No clubbing or cyanosis.   Assessment/Plan 1. Atrial fibrillation  Paroxysmal, she is in NSR today. Continue amiodarone 200 mg daily. Continue warfarin. Has history of  CVA, needs Lovenox bridge if she has to stop warfarin at any point of time.  Needs yearly eye exams. Check TSH and LFTs today.  2. Coronary artery disease   No evidence of ischemia. Nonobstructive disease on last cath. As she is on warfarin also with stable CAD, she is not on aspirin. Continue statin, acceptable lipids in 9/16.  3. Ventricular tachycardia On amiodarone with suppression of VT.  4. Ischemic cardiomyopathy NYHA class II symptoms, stable.  She is not volume overloaded.  EF 40-45% on 6/16 echo. Had to cut back on Coreg recently with low BP and did not tolerate spironolactone due to nausea.  Creatinine was higher when last checked. - BMET today, if creatinine is still high, will decrease Lasix to every other day.  - Continue current doses of Coreg and lisinopril.   5. CKD: BMET today.    Loralie Champagne 07/30/2015

## 2015-07-30 NOTE — Patient Instructions (Signed)
Routine lab work today. Will notify you of abnormal results  Follow up with Dr.McLean in 4 months 

## 2015-08-02 DIAGNOSIS — I4891 Unspecified atrial fibrillation: Secondary | ICD-10-CM | POA: Diagnosis not present

## 2015-08-03 ENCOUNTER — Other Ambulatory Visit (HOSPITAL_COMMUNITY): Payer: Self-pay | Admitting: Cardiology

## 2015-08-03 ENCOUNTER — Other Ambulatory Visit: Payer: Self-pay | Admitting: Family Medicine

## 2015-08-05 ENCOUNTER — Ambulatory Visit (INDEPENDENT_AMBULATORY_CARE_PROVIDER_SITE_OTHER): Payer: Medicare PPO | Admitting: *Deleted

## 2015-08-05 DIAGNOSIS — I472 Ventricular tachycardia: Secondary | ICD-10-CM | POA: Diagnosis not present

## 2015-08-05 DIAGNOSIS — M170 Bilateral primary osteoarthritis of knee: Secondary | ICD-10-CM | POA: Diagnosis not present

## 2015-08-05 DIAGNOSIS — M25561 Pain in right knee: Secondary | ICD-10-CM | POA: Diagnosis not present

## 2015-08-05 DIAGNOSIS — M25562 Pain in left knee: Secondary | ICD-10-CM | POA: Diagnosis not present

## 2015-08-05 DIAGNOSIS — I4729 Other ventricular tachycardia: Secondary | ICD-10-CM

## 2015-08-05 NOTE — Progress Notes (Signed)
Remote ICD transmission.   

## 2015-08-05 NOTE — Telephone Encounter (Signed)
Refill done.  

## 2015-08-16 DIAGNOSIS — I4891 Unspecified atrial fibrillation: Secondary | ICD-10-CM | POA: Diagnosis not present

## 2015-08-19 ENCOUNTER — Other Ambulatory Visit: Payer: Self-pay | Admitting: Internal Medicine

## 2015-08-20 DIAGNOSIS — I2510 Atherosclerotic heart disease of native coronary artery without angina pectoris: Secondary | ICD-10-CM | POA: Diagnosis not present

## 2015-08-20 DIAGNOSIS — E785 Hyperlipidemia, unspecified: Secondary | ICD-10-CM | POA: Diagnosis not present

## 2015-08-20 DIAGNOSIS — I252 Old myocardial infarction: Secondary | ICD-10-CM | POA: Diagnosis not present

## 2015-08-20 DIAGNOSIS — E1122 Type 2 diabetes mellitus with diabetic chronic kidney disease: Secondary | ICD-10-CM | POA: Diagnosis not present

## 2015-08-20 DIAGNOSIS — E669 Obesity, unspecified: Secondary | ICD-10-CM | POA: Diagnosis not present

## 2015-08-20 DIAGNOSIS — I130 Hypertensive heart and chronic kidney disease with heart failure and stage 1 through stage 4 chronic kidney disease, or unspecified chronic kidney disease: Secondary | ICD-10-CM | POA: Diagnosis not present

## 2015-08-20 DIAGNOSIS — E559 Vitamin D deficiency, unspecified: Secondary | ICD-10-CM | POA: Diagnosis not present

## 2015-08-20 DIAGNOSIS — E1142 Type 2 diabetes mellitus with diabetic polyneuropathy: Secondary | ICD-10-CM | POA: Diagnosis not present

## 2015-08-20 DIAGNOSIS — F3340 Major depressive disorder, recurrent, in remission, unspecified: Secondary | ICD-10-CM | POA: Diagnosis not present

## 2015-08-27 LAB — CUP PACEART REMOTE DEVICE CHECK
Battery Remaining Longevity: 48 mo
Battery Voltage: 2.93 V
Brady Statistic AS VP Percent: 1 %
Brady Statistic RA Percent Paced: 96 %
Date Time Interrogation Session: 20170515065926
HIGH POWER IMPEDANCE MEASURED VALUE: 46 Ohm
Implantable Lead Implant Date: 20070117
Implantable Lead Location: 753859
Implantable Lead Model: 7040
Lead Channel Impedance Value: 410 Ohm
Lead Channel Sensing Intrinsic Amplitude: 11.8 mV
Lead Channel Sensing Intrinsic Amplitude: 4.2 mV
Lead Channel Setting Pacing Pulse Width: 0.5 ms
Lead Channel Setting Sensing Sensitivity: 0.5 mV
MDC IDC LEAD IMPLANT DT: 20070117
MDC IDC LEAD LOCATION: 753860
MDC IDC MSMT BATTERY REMAINING PERCENTAGE: 49 %
MDC IDC MSMT LEADCHNL RV IMPEDANCE VALUE: 580 Ohm
MDC IDC PG SERIAL: 819806
MDC IDC SET LEADCHNL RA PACING AMPLITUDE: 2 V
MDC IDC SET LEADCHNL RV PACING AMPLITUDE: 2.5 V
MDC IDC STAT BRADY AP VP PERCENT: 2 %
MDC IDC STAT BRADY AP VS PERCENT: 95 %
MDC IDC STAT BRADY AS VS PERCENT: 2.9 %
MDC IDC STAT BRADY RV PERCENT PACED: 2.2 %

## 2015-08-29 ENCOUNTER — Ambulatory Visit: Payer: Medicare HMO | Admitting: Internal Medicine

## 2015-08-30 ENCOUNTER — Ambulatory Visit: Payer: Medicare PPO | Admitting: Family Medicine

## 2015-08-30 DIAGNOSIS — E119 Type 2 diabetes mellitus without complications: Secondary | ICD-10-CM | POA: Diagnosis not present

## 2015-08-30 DIAGNOSIS — T148 Other injury of unspecified body region: Secondary | ICD-10-CM | POA: Diagnosis not present

## 2015-09-03 ENCOUNTER — Other Ambulatory Visit: Payer: Self-pay | Admitting: Internal Medicine

## 2015-09-03 ENCOUNTER — Other Ambulatory Visit: Payer: Self-pay | Admitting: Gastroenterology

## 2015-09-03 ENCOUNTER — Other Ambulatory Visit (HOSPITAL_COMMUNITY): Payer: Self-pay | Admitting: Cardiology

## 2015-09-13 DIAGNOSIS — I4891 Unspecified atrial fibrillation: Secondary | ICD-10-CM | POA: Diagnosis not present

## 2015-09-20 ENCOUNTER — Ambulatory Visit: Payer: Medicare HMO | Admitting: Internal Medicine

## 2015-09-23 DIAGNOSIS — I4891 Unspecified atrial fibrillation: Secondary | ICD-10-CM | POA: Diagnosis not present

## 2015-10-09 ENCOUNTER — Ambulatory Visit: Payer: Self-pay | Admitting: Cardiology

## 2015-10-09 DIAGNOSIS — Z5181 Encounter for therapeutic drug level monitoring: Secondary | ICD-10-CM

## 2015-10-09 DIAGNOSIS — I4891 Unspecified atrial fibrillation: Secondary | ICD-10-CM | POA: Diagnosis not present

## 2015-10-09 DIAGNOSIS — I480 Paroxysmal atrial fibrillation: Secondary | ICD-10-CM

## 2015-11-03 ENCOUNTER — Other Ambulatory Visit: Payer: Self-pay | Admitting: Gastroenterology

## 2015-11-04 ENCOUNTER — Other Ambulatory Visit: Payer: Self-pay | Admitting: *Deleted

## 2015-11-04 ENCOUNTER — Ambulatory Visit (INDEPENDENT_AMBULATORY_CARE_PROVIDER_SITE_OTHER): Payer: Medicare PPO | Admitting: *Deleted

## 2015-11-04 DIAGNOSIS — I4729 Other ventricular tachycardia: Secondary | ICD-10-CM

## 2015-11-04 DIAGNOSIS — I255 Ischemic cardiomyopathy: Secondary | ICD-10-CM

## 2015-11-04 DIAGNOSIS — I472 Ventricular tachycardia: Secondary | ICD-10-CM | POA: Diagnosis not present

## 2015-11-04 MED ORDER — FUROSEMIDE 20 MG PO TABS: ORAL_TABLET | ORAL | 1 refills | Status: DC

## 2015-11-04 MED ORDER — VENLAFAXINE HCL 37.5 MG PO TABS: ORAL_TABLET | ORAL | 0 refills | Status: DC

## 2015-11-04 NOTE — Telephone Encounter (Signed)
Refill for effexor sent into pharmacy.  Pt made aware she will need an appt for future refills.

## 2015-11-04 NOTE — Progress Notes (Signed)
Remote ICD transmission.   

## 2015-11-06 ENCOUNTER — Other Ambulatory Visit: Payer: Self-pay | Admitting: Gastroenterology

## 2015-11-06 ENCOUNTER — Encounter: Payer: Self-pay | Admitting: Cardiology

## 2015-11-06 DIAGNOSIS — I4891 Unspecified atrial fibrillation: Secondary | ICD-10-CM | POA: Diagnosis not present

## 2015-11-06 DIAGNOSIS — J40 Bronchitis, not specified as acute or chronic: Secondary | ICD-10-CM | POA: Diagnosis not present

## 2015-11-08 LAB — CUP PACEART REMOTE DEVICE CHECK
Battery Remaining Percentage: 47 %
Battery Voltage: 2.93 V
Brady Statistic AP VP Percent: 2.2 %
Brady Statistic AS VS Percent: 2.7 %
Brady Statistic RA Percent Paced: 96 %
Date Time Interrogation Session: 20170814073322
HIGH POWER IMPEDANCE MEASURED VALUE: 48 Ohm
Implantable Lead Implant Date: 20070117
Implantable Lead Location: 753860
Lead Channel Impedance Value: 450 Ohm
Lead Channel Impedance Value: 590 Ohm
Lead Channel Pacing Threshold Amplitude: 1 V
Lead Channel Pacing Threshold Pulse Width: 0.5 ms
Lead Channel Sensing Intrinsic Amplitude: 2.4 mV
Lead Channel Setting Pacing Pulse Width: 0.5 ms
Lead Channel Setting Sensing Sensitivity: 0.5 mV
MDC IDC LEAD IMPLANT DT: 20070117
MDC IDC LEAD LOCATION: 753859
MDC IDC MSMT BATTERY REMAINING LONGEVITY: 46 mo
MDC IDC MSMT LEADCHNL RA PACING THRESHOLD AMPLITUDE: 0.75 V
MDC IDC MSMT LEADCHNL RV PACING THRESHOLD PULSEWIDTH: 0.5 ms
MDC IDC MSMT LEADCHNL RV SENSING INTR AMPL: 11.8 mV
MDC IDC PG SERIAL: 819806
MDC IDC SET LEADCHNL RA PACING AMPLITUDE: 2 V
MDC IDC SET LEADCHNL RV PACING AMPLITUDE: 2.5 V
MDC IDC STAT BRADY AP VS PERCENT: 95 %
MDC IDC STAT BRADY AS VP PERCENT: 1 %
MDC IDC STAT BRADY RV PERCENT PACED: 2.4 %

## 2015-11-13 ENCOUNTER — Encounter: Payer: Self-pay | Admitting: Gastroenterology

## 2015-11-20 ENCOUNTER — Encounter: Payer: Self-pay | Admitting: Cardiology

## 2015-11-22 DIAGNOSIS — I4891 Unspecified atrial fibrillation: Secondary | ICD-10-CM | POA: Diagnosis not present

## 2015-12-02 ENCOUNTER — Other Ambulatory Visit: Payer: Self-pay | Admitting: Internal Medicine

## 2015-12-06 ENCOUNTER — Other Ambulatory Visit (HOSPITAL_COMMUNITY): Payer: Self-pay | Admitting: *Deleted

## 2015-12-06 DIAGNOSIS — I4891 Unspecified atrial fibrillation: Secondary | ICD-10-CM | POA: Diagnosis not present

## 2015-12-06 MED ORDER — ROSUVASTATIN CALCIUM 20 MG PO TABS: 20.0000 mg | ORAL_TABLET | Freq: Every day | ORAL | 3 refills | Status: DC

## 2015-12-13 ENCOUNTER — Encounter (HOSPITAL_COMMUNITY): Payer: Medicare PPO

## 2015-12-16 ENCOUNTER — Other Ambulatory Visit: Payer: Self-pay | Admitting: Internal Medicine

## 2015-12-25 ENCOUNTER — Encounter: Payer: Commercial Managed Care - HMO | Admitting: Internal Medicine

## 2016-01-03 DIAGNOSIS — I4891 Unspecified atrial fibrillation: Secondary | ICD-10-CM | POA: Diagnosis not present

## 2016-01-06 NOTE — Progress Notes (Deleted)
  [facility] [address], [place] [address] Phone: [phone] Subjective:     CC: bilateral knee pain  [id number]  [name] is a 70 y.o. female coming in with complaint of bilateral knee pain. Patient was seen previously and did have an injection over 3 months ago. Patient states that her right knee seems to be doing very well but the left knee seems to be doing a little worse. Patient states that the pain seems to be on the medial aspect still. Patient has known osteophytic changes of the knee. Patient is still able to ambulate and do daily activities but is becoming more difficult. Denies any nighttime awakening. Denies any significant instability with the knee is giving out on her.    Past medical history, social, surgical and family history all reviewed in electronic medical record.   Review of Systems: No headache, visual changes, nausea, vomiting, diarrhea, constipation, dizziness, abdominal pain, skin rash, fevers, chills, night sweats, weight loss, swollen lymph nodes, body aches, joint swelling, muscle aches, chest pain, shortness of breath, mood changes.   Objective  There were no vitals taken for this visit.  General: No apparent distress alert and oriented x3 mood and affect normal, dressed appropriately. Overweight HEENT: Pupils equal, extraocular movements intact  Respiratory: Patient's speak in full sentences and does not appear short of breath  Cardiovascular: No lower extremity edema, non tender, no erythema  Skin: Warm dry intact with no signs of infection or rash on extremities or on axial skeleton.  Abdomen: Soft nontender  Neuro: Cranial nerves II through XII are intact, neurovascularly intact in all extremities with 2+ DTRs and 2+ pulses.  Lymph: No lymphadenopathy of posterior or anterior cervical chain or axillae bilaterally.  Gait normal with good balance and coordination.  MSK:  Non tender with full range of  motion and good stability and symmetric strength and tone of shoulders, elbows, wrist, hip, and ankles bilaterally.  Knee: left Normal to inspection with no erythema or effusion or obvious bony abnormalities. Worsening tenderness over the medial joint line on the left knee but not the right knee ROM full in flexion and extension and lower leg rotation. Ligaments with solid consistent endpoints including ACL, PCL, LCL, MCL. Negative Mcmurray's, Apley's, and Thessalonian tests. painful patellar compression Moderate crepitus noted on range of motion Patellar and quadriceps tendons unremarkable. Hamstring and quadriceps strength is normal.  Contralateral knee doing better than previous exam.  After informed written and verbal consent, patient was seated on exam table. Left knee was prepped with alcohol swab and utilizing anterolateral approach, patient's left knee space was injected with 4:1  marcaine 0.5%: Kenalog 40mg /dL. Patient tolerated the procedure well without immediate complications.   Impression and Recommendations:     This case required medical decision making of moderate complexity.

## 2016-01-07 ENCOUNTER — Encounter (HOSPITAL_COMMUNITY): Payer: Self-pay

## 2016-01-07 ENCOUNTER — Ambulatory Visit (HOSPITAL_COMMUNITY)
Admission: RE | Admit: 2016-01-07 | Discharge: 2016-01-07 | Disposition: A | Discharge status: Home / Self Care | Payer: Medicare PPO | Source: Ambulatory Visit | Attending: Cardiology | Admitting: Cardiology

## 2016-01-07 ENCOUNTER — Ambulatory Visit: Payer: Medicare PPO | Admitting: Family Medicine

## 2016-01-07 VITALS — BP 144/77 | HR 66 | Wt 196.8 lb

## 2016-01-07 DIAGNOSIS — Z8673 Personal history of transient ischemic attack (TIA), and cerebral infarction without residual deficits: Secondary | ICD-10-CM | POA: Diagnosis not present

## 2016-01-07 DIAGNOSIS — I2510 Atherosclerotic heart disease of native coronary artery without angina pectoris: Secondary | ICD-10-CM | POA: Insufficient documentation

## 2016-01-07 DIAGNOSIS — Z87891 Personal history of nicotine dependence: Secondary | ICD-10-CM | POA: Insufficient documentation

## 2016-01-07 DIAGNOSIS — I129 Hypertensive chronic kidney disease with stage 1 through stage 4 chronic kidney disease, or unspecified chronic kidney disease: Secondary | ICD-10-CM | POA: Diagnosis not present

## 2016-01-07 DIAGNOSIS — N183 Chronic kidney disease, stage 3 (moderate): Secondary | ICD-10-CM | POA: Insufficient documentation

## 2016-01-07 DIAGNOSIS — I351 Nonrheumatic aortic (valve) insufficiency: Secondary | ICD-10-CM | POA: Insufficient documentation

## 2016-01-07 DIAGNOSIS — E1122 Type 2 diabetes mellitus with diabetic chronic kidney disease: Secondary | ICD-10-CM | POA: Diagnosis not present

## 2016-01-07 DIAGNOSIS — I480 Paroxysmal atrial fibrillation: Secondary | ICD-10-CM | POA: Diagnosis not present

## 2016-01-07 DIAGNOSIS — I472 Ventricular tachycardia: Secondary | ICD-10-CM | POA: Diagnosis not present

## 2016-01-07 DIAGNOSIS — Z90710 Acquired absence of both cervix and uterus: Secondary | ICD-10-CM | POA: Insufficient documentation

## 2016-01-07 DIAGNOSIS — Z95810 Presence of automatic (implantable) cardiac defibrillator: Secondary | ICD-10-CM

## 2016-01-07 DIAGNOSIS — Z8249 Family history of ischemic heart disease and other diseases of the circulatory system: Secondary | ICD-10-CM | POA: Diagnosis not present

## 2016-01-07 DIAGNOSIS — Z7901 Long term (current) use of anticoagulants: Secondary | ICD-10-CM | POA: Diagnosis not present

## 2016-01-07 DIAGNOSIS — Z955 Presence of coronary angioplasty implant and graft: Secondary | ICD-10-CM | POA: Insufficient documentation

## 2016-01-07 DIAGNOSIS — K3184 Gastroparesis: Secondary | ICD-10-CM | POA: Insufficient documentation

## 2016-01-07 DIAGNOSIS — Z794 Long term (current) use of insulin: Secondary | ICD-10-CM | POA: Diagnosis not present

## 2016-01-07 DIAGNOSIS — I5022 Chronic systolic (congestive) heart failure: Secondary | ICD-10-CM

## 2016-01-07 DIAGNOSIS — I255 Ischemic cardiomyopathy: Secondary | ICD-10-CM | POA: Insufficient documentation

## 2016-01-07 LAB — BASIC METABOLIC PANEL
Anion gap: 8 (ref 5–15)
BUN: 27 mg/dL — AB (ref 6–20)
CHLORIDE: 114 mmol/L — AB (ref 101–111)
CO2: 24 mmol/L (ref 22–32)
Calcium: 8.9 mg/dL (ref 8.9–10.3)
Creatinine, Ser: 1.44 mg/dL — ABNORMAL HIGH (ref 0.44–1.00)
GFR calc Af Amer: 42 mL/min — ABNORMAL LOW (ref >60)
GFR calc non Af Amer: 36 mL/min — ABNORMAL LOW (ref >60)
GLUCOSE: 94 mg/dL (ref 65–99)
POTASSIUM: 4.6 mmol/L (ref 3.5–5.1)
Sodium: 146 mmol/L — ABNORMAL HIGH (ref 135–145)

## 2016-01-07 LAB — CBC
HEMATOCRIT: 38.7 % (ref 36.0–46.0)
Hemoglobin: 12.6 g/dL (ref 12.0–15.0)
MCH: 30.5 pg (ref 26.0–34.0)
MCHC: 32.6 g/dL (ref 30.0–36.0)
MCV: 93.7 fL (ref 78.0–100.0)
PLATELETS: 274 10*3/uL (ref 150–400)
RBC: 4.13 MIL/uL (ref 3.87–5.11)
RDW: 14.3 % (ref 11.5–15.5)
WBC: 9.4 10*3/uL (ref 4.0–10.5)

## 2016-01-07 LAB — HEPATIC FUNCTION PANEL
ALK PHOS: 60 U/L (ref 38–126)
ALT: 12 U/L — AB (ref 14–54)
AST: 18 U/L (ref 15–41)
Albumin: 3.6 g/dL (ref 3.5–5.0)
BILIRUBIN DIRECT: 0.2 mg/dL (ref 0.1–0.5)
BILIRUBIN INDIRECT: 0.5 mg/dL (ref 0.3–0.9)
TOTAL PROTEIN: 6.7 g/dL (ref 6.5–8.1)
Total Bilirubin: 0.7 mg/dL (ref 0.3–1.2)

## 2016-01-07 LAB — TSH: TSH: 4.36 u[IU]/mL (ref 0.350–4.500)

## 2016-01-07 NOTE — Progress Notes (Signed)
Patient ID: [name], female   DOB: 19-Jan-1946, 70 y.o.   MRN: [id number] PCP: Dr Jenny Reichmann Cardiology: Dr. Aundra Dubin EP: Dr Candie Echevaria  70 yo with history of CAD, ischemic cardiomyopathy, and atrial fibrillation presents for cardiology followup.  Patient was hospitalized in 11/12 at [facility] for VT with ICD discharge.  She had a left heart cath showing patent stents.  EF was 35-40% by echo.  She is on amiodarone.  She has had periodic problems with creatinine rising with medication adjustments.  Most recent echo in 6/16 showed EF 40-45%.   She returns for HF follow up. Denies SOB/PND/Orthopnea.No chest pain. No bleeding problems. Weight at home 177 pounds. Limited mobility due to the knee pain. Taking medications. Continues to live in [place] with her daughter.    Labs (10/12): LDL 73, HDL 44 Labs (11/12): K 3.9, creatinine 1.1, LFTs normal, TSH normal Labs (12/12): K 3.9, creatinine 1.5, proBNP 18 Labs (1/13): LDL 82, HDL 57 Labs (2/13): K 4.3, creatinine 1.5 Labs (5/13): creatinine 2.4 => 1.7, LFTs normal, TSH normal, proBNP 18 Labs (6/13): K 4.2, creatinine 1.7 Labs (10/13): K 4.3, creatinine 1.4 Labs (4/14); LFTs normal, TSH normal, LDL 71, HDL 58 Labs (5/14): K 4.5, creatinine 1.4 Labs (8/14): TSH normal, LFTs normal Labs (9/14): K 4.2, creatinine 1.8, LDL 75, HDL 54 Labs (07/31/13) K 3.7 Creatinine 1.8 BNP 148  Labs (9/15): K 4.3, creatinine 1.24, LFTs normal, LDL 81, HDL 58, TSH normal Labs (9/16): K 4.4, creatinine 1.44, LDL 64, HDL 54, LFTs normal, TSH normal, HCT 38 Labs (2/17): K 4.9, creatinine 1.76, HCT 38.6 Labs (07/2015): K 4.5 Creatinine 1.88    PMH: 1. Diabetes mellitus 2. CVA, TIA in 2010 3. HTN 4. CKD stage 3 5. H/o TAH 6. H/o CCY 7. Sciatica 8. Atrial fibrillation 9. CAD: s/p LAD and RCA PCI.  Last LHC in 11/12 with patent proximal LAD stent, ostial 70% D1 (jailed by stent), mild LAD stent patent, patent RCA stents, EF 40% with global hypokinesis.  10.  Ischemic cardiomyopathy: Echo (10/12): EF 35-40%, moderate focal basal septal hypertrophy, inferior akinesis, grade I diastolic dysfunction, moderate aortic insufficiency. St Jude dual chamber ICD.  Spironolactone stopped when creatinine rose to 2.5.  Echo (5/14) with EF 40-45%, mild AI.  Echo (6/16) with EF 40-45%, inferior/inferoseptal hypokinesis, mild LVH.  11. Aortic insufficiency: moderate in past but mild on most recent echo.   12. 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  13. Gastroparesis  SH: Divorced.  3 children.  Quit smoking in 1997. Lives in [place] now with daughter.  [place].   FH: CAD  ROS: All systems reviewed and negative except as per HPI.   Current Outpatient Prescriptions  Medication Sig Dispense Refill  . amiodarone (PACERONE) 200 MG tablet TAKE 1 TABLET (200 MG TOTAL) BY MOUTH DAILY. 60 tablet 2  . Blood Glucose Monitoring Suppl (ACCU-CHEK NANO SMARTVIEW) W/DEVICE KIT Use to test blood sugar 4 times daily as instructed. Dx code: E11.59 1 kit 0  . carvedilol (COREG) 25 MG tablet Take 0.5 tablets (12.5 mg total) by mouth 2 (two) times daily with a meal. 60 tablet 3  . Cholecalciferol (VITAMIN D3) 2000 UNITS TABS Take 2,000 Units by mouth daily.     . furosemide (LASIX) 20 MG tablet Take 20 mg by mouth every other day.    . gabapentin (NEURONTIN) 400 MG capsule TAKE 1 CAPSULE (400 MG TOTAL) BY MOUTH 2 (TWO) TIMES DAILY. 180 capsule 0  . glucose blood (  ACCU-CHEK SMARTVIEW) test strip Use to test blood sugar 4 times daily as instructed. Dx code E11.59 200 each 11  . glycopyrrolate (ROBINUL) 1 MG tablet TAKE 1 TABLET (1 MG TOTAL) BY MOUTH 2 (TWO) TIMES DAILY. 60 tablet 1  . Insulin Detemir (LEVEMIR FLEXTOUCH) 100 UNIT/ML Pen Inject under skin 25 units at bedtime 45 mL 1  . lisinopril (PRINIVIL,ZESTRIL) 5 MG tablet Take 1 tablet (5 mg total) by mouth daily. 90 tablet 3  . metFORMIN (GLUCOPHAGE-XR) 500 MG 24 hr tablet TAKE 1 TABLET TWICE A DAY WITH A MEAL 180 tablet 0  .  montelukast (SINGULAIR) 10 MG tablet Take 1 tablet (10 mg total) by mouth daily. 90 tablet 3  . Multiple Vitamins-Minerals (EYE VITAMINS PO) Take 1 tablet by mouth 2 (two) times daily.     . nizatidine (AXID) 150 MG capsule TAKE 1 CAPSULE (150 MG TOTAL) BY MOUTH DAILY. 30 capsule 0  . NOVOLOG 100 UNIT/ML injection INJECT 6-12 UNITS INTO THE SKIN 3 (THREE) TIMES DAILY BEFORE MEALS. 10 mL 2  . omeprazole (PRILOSEC) 20 MG capsule TAKE 1 CAPSULE BY MOUTH DAILY. 30 capsule 2  . rosuvastatin (CRESTOR) 20 MG tablet Take 1 tablet (20 mg total) by mouth daily. 30 tablet 3  . traMADol (ULTRAM) 50 MG tablet Take 50 mg by mouth every 6 (six) hours as needed (pain).    [name] venlafaxine (EFFEXOR) 37.5 MG tablet TAKE 1 TABLET BY MOUTH 2 (TWO) TIMES DAILY. APPOINTMENT NEEDED FOR FUTURE REFILLS. 60 tablet 0  . Vitamin D, Ergocalciferol, (DRISDOL) 50000 units CAPS capsule Take 1 capsule (50,000 Units total) by mouth once a week. 12 capsule 3  . warfarin (COUMADIN) 1 MG tablet TAKE AS DIRECTED BY COUMADIN CLINIC 40 tablet 3  . nitroGLYCERIN (NITROSTAT) 0.4 MG SL tablet Place 1 tablet (0.4 mg total) under the tongue every 5 (five) minutes as needed for chest pain. (Patient not taking: Reported on 01/07/2016) 25 tablet 1   No current facility-administered medications for this encounter.     BP (!) 144/77   Pulse 66   Wt 196 lb 12 oz (89.2 kg)   SpO2 100%   BMI 34.85 kg/m  General: NAD, obese. Ambulated in the clinic without  Neck: Thick, JVP 5-6 cm, no thyromegaly or thyroid nodule.  Lungs: Clear to auscultation bilaterally with normal respiratory effort. CV: Nondisplaced PMI.  Heart regular S1/S2, no S3/S4, 2/6 early SEM.  Trace ankle edema.  No carotid bruit.  Normal pedal pulses.  Abdomen: Obese, Soft, nontender, no hepatosplenomegaly, no distention.  Neurologic: Alert and oriented x 3.  Psych: Normal affect. Extremities: No clubbing or cyanosis.   Assessment/Plan 1. Atrial fibrillation  Paroxysmal,  regular pulse. Continue amiodarone 200 mg daily. Continue warfarin. Has history of  CVA, needs Lovenox bridge if she has to stop warfarin at any point of time.  Needs yearly eye exams. Check TSH and LFTs today.  2. Coronary artery disease   No CP. Nonobstructive disease on last cath. As she is on warfarin also with stable CAD, she is not on aspirin. Continue statin, acceptable lipids in 9/16. Repeat lipids next visit.  3. Ventricular tachycardia On amiodarone with suppression of VT.  4. Ischemic cardiomyopathy NYHA class II symptoms, stable.  She is not volume overloaded. EF 40-45% on 6/16 echo. Repeat ECHO next visit.  Intolerant of higher dose of coreg due to BP and fatigue.  Intolerant  spironolactone due to nausea.  - BMET - Continue current doses of Coreg and  lisinopril.   5. CKD: BMET today.    Follow up in 4 months with an ECHO.     NP-C 01/07/2016

## 2016-01-07 NOTE — Patient Instructions (Signed)
Labs today  We will contact you in 4 months to schedule your next appointment and echocardiogram  

## 2016-01-14 ENCOUNTER — Other Ambulatory Visit: Payer: Self-pay | Admitting: Gastroenterology

## 2016-01-15 DIAGNOSIS — E119 Type 2 diabetes mellitus without complications: Secondary | ICD-10-CM | POA: Diagnosis not present

## 2016-01-15 DIAGNOSIS — M179 Osteoarthritis of knee, unspecified: Secondary | ICD-10-CM | POA: Diagnosis not present

## 2016-01-15 DIAGNOSIS — Z23 Encounter for immunization: Secondary | ICD-10-CM | POA: Diagnosis not present

## 2016-01-15 DIAGNOSIS — M25561 Pain in right knee: Secondary | ICD-10-CM | POA: Diagnosis not present

## 2016-01-15 DIAGNOSIS — Z1159 Encounter for screening for other viral diseases: Secondary | ICD-10-CM | POA: Diagnosis not present

## 2016-01-24 DIAGNOSIS — E119 Type 2 diabetes mellitus without complications: Secondary | ICD-10-CM | POA: Diagnosis not present

## 2016-01-24 DIAGNOSIS — M170 Bilateral primary osteoarthritis of knee: Secondary | ICD-10-CM | POA: Diagnosis not present

## 2016-01-24 DIAGNOSIS — I509 Heart failure, unspecified: Secondary | ICD-10-CM | POA: Diagnosis not present

## 2016-01-28 ENCOUNTER — Other Ambulatory Visit (HOSPITAL_COMMUNITY): Payer: Self-pay | Admitting: Cardiology

## 2016-01-31 ENCOUNTER — Other Ambulatory Visit: Payer: Self-pay | Admitting: Internal Medicine

## 2016-02-03 ENCOUNTER — Ambulatory Visit (INDEPENDENT_AMBULATORY_CARE_PROVIDER_SITE_OTHER): Payer: Medicare PPO | Admitting: *Deleted

## 2016-02-03 ENCOUNTER — Telehealth: Payer: Self-pay | Admitting: Cardiology

## 2016-02-03 DIAGNOSIS — I255 Ischemic cardiomyopathy: Secondary | ICD-10-CM

## 2016-02-03 NOTE — Telephone Encounter (Signed)
Spoke with pt and reminded pt of remote transmission that is due today. Pt verbalized understanding.   

## 2016-02-05 ENCOUNTER — Ambulatory Visit: Payer: Medicare PPO | Admitting: Gastroenterology

## 2016-02-05 DIAGNOSIS — I4891 Unspecified atrial fibrillation: Secondary | ICD-10-CM | POA: Diagnosis not present

## 2016-02-05 NOTE — Progress Notes (Signed)
Remote ICD transmission.   

## 2016-02-06 ENCOUNTER — Encounter: Payer: Medicare PPO | Admitting: Internal Medicine

## 2016-02-07 ENCOUNTER — Other Ambulatory Visit: Payer: Self-pay | Admitting: Gastroenterology

## 2016-02-07 ENCOUNTER — Other Ambulatory Visit: Payer: Self-pay | Admitting: Internal Medicine

## 2016-02-12 ENCOUNTER — Other Ambulatory Visit: Payer: Self-pay

## 2016-02-12 MED ORDER — GLYCOPYRROLATE 1 MG PO TABS: ORAL_TABLET | ORAL | 0 refills | Status: DC

## 2016-02-12 NOTE — Telephone Encounter (Signed)
  [facility] day supply of [facility]  Pt has appointment in early Feb with Dr Ardis Hughs.

## 2016-02-15 LAB — CUP PACEART REMOTE DEVICE CHECK
Battery Remaining Longevity: 43 mo
Battery Remaining Percentage: 44 %
Battery Voltage: 2.92 V
Brady Statistic AP VS Percent: 95 %
Brady Statistic RA Percent Paced: 96 %
HIGH POWER IMPEDANCE MEASURED VALUE: 46 Ohm
Implantable Lead Implant Date: 20070117
Implantable Lead Location: 753860
Lead Channel Impedance Value: 580 Ohm
Lead Channel Pacing Threshold Amplitude: 0.75 V
Lead Channel Pacing Threshold Pulse Width: 0.5 ms
Lead Channel Setting Sensing Sensitivity: 0.5 mV
MDC IDC LEAD IMPLANT DT: 20070117
MDC IDC LEAD LOCATION: 753859
MDC IDC MSMT LEADCHNL RA IMPEDANCE VALUE: 460 Ohm
MDC IDC MSMT LEADCHNL RA SENSING INTR AMPL: 5 mV
MDC IDC MSMT LEADCHNL RV PACING THRESHOLD AMPLITUDE: 1 V
MDC IDC MSMT LEADCHNL RV PACING THRESHOLD PULSEWIDTH: 0.5 ms
MDC IDC MSMT LEADCHNL RV SENSING INTR AMPL: 11.8 mV
MDC IDC PG IMPLANT DT: 20130212
MDC IDC PG SERIAL: 819806
MDC IDC SESS DTM: 20171113200829
MDC IDC SET LEADCHNL RA PACING AMPLITUDE: 2 V
MDC IDC SET LEADCHNL RV PACING AMPLITUDE: 2.5 V
MDC IDC SET LEADCHNL RV PACING PULSEWIDTH: 0.5 ms
MDC IDC STAT BRADY AP VP PERCENT: 2.2 %
MDC IDC STAT BRADY AS VP PERCENT: 1 %
MDC IDC STAT BRADY AS VS PERCENT: 2.6 %
MDC IDC STAT BRADY RV PERCENT PACED: 2.4 %

## 2016-02-24 ENCOUNTER — Other Ambulatory Visit (HOSPITAL_COMMUNITY): Payer: Self-pay | Admitting: *Deleted

## 2016-02-24 MED ORDER — ROSUVASTATIN CALCIUM 20 MG PO TABS: 20.0000 mg | ORAL_TABLET | Freq: Every day | ORAL | 6 refills | Status: DC

## 2016-03-08 ENCOUNTER — Other Ambulatory Visit: Payer: Self-pay | Admitting: Internal Medicine

## 2016-03-24 ENCOUNTER — Ambulatory Visit: Payer: Medicare PPO | Admitting: Gastroenterology

## 2016-03-25 ENCOUNTER — Other Ambulatory Visit (HOSPITAL_COMMUNITY): Payer: Self-pay | Admitting: *Deleted

## 2016-03-25 DIAGNOSIS — R197 Diarrhea, unspecified: Secondary | ICD-10-CM | POA: Diagnosis not present

## 2016-03-25 DIAGNOSIS — I4891 Unspecified atrial fibrillation: Secondary | ICD-10-CM | POA: Diagnosis not present

## 2016-03-25 DIAGNOSIS — E119 Type 2 diabetes mellitus without complications: Secondary | ICD-10-CM | POA: Diagnosis not present

## 2016-03-25 MED ORDER — AMIODARONE HCL 200 MG PO TABS: ORAL_TABLET | ORAL | 3 refills | Status: DC

## 2016-03-31 ENCOUNTER — Encounter: Payer: Medicare PPO | Admitting: Internal Medicine

## 2016-04-01 ENCOUNTER — Telehealth: Payer: Self-pay | Admitting: Nurse Practitioner

## 2016-04-01 NOTE — Telephone Encounter (Signed)
Spoke with representative from [facility] and informed her that I was unsure if it was our office or if CHF clinic had taken this over.  Advised would need to clarify dosage as chart currently shows Furosemide 20mg  QOD and last filled I see was 20mg  QD.  Representative said all we would need to do is send in the new prescription once dose was clarified.  Will route to Dr. Jackalyn Lombard nurse for review.

## 2016-04-01 NOTE — Telephone Encounter (Signed)
Kim from [facility] is calling on behalf of patient ([name]) the patient is requesting a new prescription, please call [phone]. Thanks.

## 2016-04-02 ENCOUNTER — Telehealth: Payer: Self-pay | Admitting: *Deleted

## 2016-04-02 ENCOUNTER — Other Ambulatory Visit (HOSPITAL_COMMUNITY): Payer: Self-pay | Admitting: *Deleted

## 2016-04-02 ENCOUNTER — Other Ambulatory Visit: Payer: Self-pay | Admitting: *Deleted

## 2016-04-02 ENCOUNTER — Telehealth: Payer: Self-pay | Admitting: Family Medicine

## 2016-04-02 ENCOUNTER — Telehealth: Payer: Self-pay | Admitting: Internal Medicine

## 2016-04-02 MED ORDER — NIZATIDINE 150 MG PO CAPS: ORAL_CAPSULE | ORAL | 0 refills | Status: DC

## 2016-04-02 MED ORDER — CARVEDILOL 25 MG PO TABS: 12.5000 mg | ORAL_TABLET | Freq: Two times a day (BID) | ORAL | 3 refills | Status: DC

## 2016-04-02 MED ORDER — LISINOPRIL 5 MG PO TABS: 5.0000 mg | ORAL_TABLET | Freq: Every day | ORAL | 3 refills | Status: DC

## 2016-04-02 MED ORDER — OMEPRAZOLE 20 MG PO CPDR: 20.0000 mg | DELAYED_RELEASE_CAPSULE | Freq: Every day | ORAL | 0 refills | Status: DC

## 2016-04-02 MED ORDER — AMIODARONE HCL 200 MG PO TABS: ORAL_TABLET | ORAL | 3 refills | Status: DC

## 2016-04-02 NOTE — Telephone Encounter (Signed)
Receive called from [facility] request refill for venlafaxine (EFFEXOR) 37.5 MG tablet for 90 days supply. They said they request this med 3 day ago but never heard from our office. Please advise.

## 2016-04-02 NOTE — Telephone Encounter (Signed)
CHF is managing will forward to them

## 2016-04-02 NOTE — Telephone Encounter (Signed)
[facility] left msg on triage requesting refills on pt citalopram, Omeprazole, and Nizatidine. Pt hasn't seen Dr. Jenny Reichmann since 11/2014 sent back DENIED NEED OV...[name]

## 2016-04-02 NOTE — Telephone Encounter (Signed)
Rec'd call pt states [facility] sent a request to have her medication refill, and they stated they was denied and she is wanting to know reason why refills was denied. Inform pt because she is overdue for appt last saw MD 11/2014. Pt states she had to cancel her appt last week due to daughter having a heart attack. She states she is out of medications, and her daughter is the one that drives her. Inform pt we can send only a 30 day to a local pharmacy until she come in for appt w/MD. Pt has made CPX appt for 04/29/16. Sent in 30 day on med that was requested yesterday from [place] [facility].../lmb

## 2016-04-02 NOTE — Telephone Encounter (Signed)
furosemide (LASIX) 20 MG tablet  Medication  Date: 01/07/2016 Department: [facility] Ordering: Harvie Junior, CMA Authorizing: Historical Provider, MD  Order Providers   Documenting Provider Encounter Provider  Historical Provider, MD [place] Provider Type of Supervision  Jolaine Artist, MD Supervision Required  Medication Detail    Disp Refills Start End   furosemide (LASIX) 20 MG tablet       Sig - Route: Take 20 mg by mouth every other day. - Oral   Class: Historical Med   Pharmacy   [id number] [place], [address] AT [address]

## 2016-04-02 NOTE — Telephone Encounter (Signed)
Refill denied. Pt needs OV.

## 2016-04-02 NOTE — Telephone Encounter (Signed)
°*  STAT* If patient is at the pharmacy, call can be transferred to refill team.   1. Which medications need to be refilled? (please list name of each medication and dose if known) New Prescription for Furosemide 20 mg  2. Which pharmacy/location (including street and city if local pharmacy) is medication to be sent to?[place] [phone]  3. Do they need a 30 day or 90 day supply? Not sure of the amount

## 2016-04-03 ENCOUNTER — Other Ambulatory Visit (HOSPITAL_COMMUNITY): Payer: Self-pay | Admitting: *Deleted

## 2016-04-03 MED ORDER — LISINOPRIL 5 MG PO TABS: 5.0000 mg | ORAL_TABLET | Freq: Every day | ORAL | 3 refills | Status: DC

## 2016-04-03 MED ORDER — AMIODARONE HCL 200 MG PO TABS: ORAL_TABLET | ORAL | 3 refills | Status: DC

## 2016-04-03 MED ORDER — FUROSEMIDE 20 MG PO TABS: 20.0000 mg | ORAL_TABLET | ORAL | 3 refills | Status: DC

## 2016-04-03 MED ORDER — ROSUVASTATIN CALCIUM 20 MG PO TABS: 20.0000 mg | ORAL_TABLET | Freq: Every day | ORAL | 6 refills | Status: DC

## 2016-04-03 MED ORDER — CARVEDILOL 25 MG PO TABS: 12.5000 mg | ORAL_TABLET | Freq: Two times a day (BID) | ORAL | 3 refills | Status: DC

## 2016-04-03 NOTE — Telephone Encounter (Signed)
Per pt's chart she should be on 20 mg QOD, rx sent in:  Notes Recorded by Larey Dresser, MD on 07/30/2015 at 4:47 PM Creatinine higher. Decrease Lasix to every other day and repeat BMET in 10 days.

## 2016-04-10 ENCOUNTER — Other Ambulatory Visit (HOSPITAL_COMMUNITY): Payer: Self-pay | Admitting: *Deleted

## 2016-04-10 MED ORDER — CARVEDILOL 25 MG PO TABS: 12.5000 mg | ORAL_TABLET | Freq: Two times a day (BID) | ORAL | 3 refills | Status: DC

## 2016-04-10 MED ORDER — AMIODARONE HCL 200 MG PO TABS: ORAL_TABLET | ORAL | 3 refills | Status: DC

## 2016-04-10 MED ORDER — LISINOPRIL 5 MG PO TABS: 5.0000 mg | ORAL_TABLET | Freq: Every day | ORAL | 3 refills | Status: DC

## 2016-04-24 ENCOUNTER — Ambulatory Visit: Payer: Medicare PPO | Admitting: Gastroenterology

## 2016-04-24 DIAGNOSIS — M170 Bilateral primary osteoarthritis of knee: Secondary | ICD-10-CM | POA: Diagnosis not present

## 2016-04-29 ENCOUNTER — Encounter: Payer: Medicare PPO | Admitting: Internal Medicine

## 2016-05-06 DIAGNOSIS — I4891 Unspecified atrial fibrillation: Secondary | ICD-10-CM | POA: Diagnosis not present

## 2016-05-06 DIAGNOSIS — K30 Functional dyspepsia: Secondary | ICD-10-CM | POA: Diagnosis not present

## 2016-05-06 DIAGNOSIS — E785 Hyperlipidemia, unspecified: Secondary | ICD-10-CM | POA: Diagnosis not present

## 2016-05-06 DIAGNOSIS — E119 Type 2 diabetes mellitus without complications: Secondary | ICD-10-CM | POA: Diagnosis not present

## 2016-05-12 NOTE — Progress Notes (Signed)
Electrophysiology Office Note Date: 05/13/2016  ID:  [name], [name] [birth year], MRN [id number]  PCP: Cathlean Cower, MD Primary Cardiologist: Aundra Dubin Electrophysiologist: Allred  CC: Routine ICD follow-up  [name] is a 71 y.o. female seen today for Dr Rayann Heman.  She presents today for routine electrophysiology followup.  Since last being seen in our clinic, the patient reports doing very well. She is now living in the [place] area with her daughter. She would like to keep her care in [place].  We discussed today the value in having local EP service.  She denies chest pain, palpitations, dyspnea, PND, orthopnea, nausea, vomiting, dizziness, syncope, edema, weight gain, or early satiety.  She has not had ICD shocks.   Device History: STJ dual chamber ICD implanted 2007 for ICM History of appropriate therapy: Yes History of AAD therapy: Yes - amiodarone    Past Medical History:  Diagnosis Date  . Allergy   . Arthritis   . Atrial fibrillation ([place])    controlled with amiodarone, on coumadin  . Chronic renal insufficiency   . Chronic systolic heart failure ([place])   . Congestive heart failure ([place])   . Coronary artery disease 01/31/2011  . Diabetes mellitus    type 1  . 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   . History of chicken pox   . Hyperlipidemia   . Hypertension   . Ischemic cardiomyopathy   . Lumbar spondylosis 01/11/2012  . Stroke [facility]) 2010   eye doctor said she had TIA  . Ventricular tachycardia ([place])    Polymorphic   Past Surgical History:  Procedure Laterality Date  . ABDOMINAL HYSTERECTOMY  1995  . CARDIAC DEFIBRILLATOR PLACEMENT    . CHOLECYSTECTOMY  1985  . ICD  2003/2007   implanted by Dr Rollene Fare, most recent generator change 2/13 by Dr Rayann Heman, Analyze ST study patient  . IMPLANTABLE CARDIOVERTER DEFIBRILLATOR (ICD) GENERATOR CHANGE N/A 05/05/2011   Procedure: ICD GENERATOR CHANGE;  Surgeon: Thompson Grayer, MD;  Location: [facility]  CATH LAB;  Service: Cardiovascular;  Laterality: N/A;  . LEFT HEART CATHETERIZATION WITH CORONARY ANGIOGRAM N/A 01/30/2011   Procedure: LEFT HEART CATHETERIZATION WITH CORONARY ANGIOGRAM;  Surgeon: Larey Dresser, MD;  Location: [facility] CATH LAB;  Service: Cardiovascular;  Laterality: N/A;  . TUBALIGATION  1980    Current Outpatient Prescriptions  Medication Sig Dispense Refill  . amiodarone (PACERONE) 200 MG tablet TAKE 1 TABLET (200 MG TOTAL) BY MOUTH DAILY. 90 tablet 3  . Blood Glucose Monitoring Suppl (ACCU-CHEK NANO SMARTVIEW) W/DEVICE KIT Use to test blood sugar 4 times daily as instructed. Dx code: E11.59 1 kit 0  . carvedilol (COREG) 25 MG tablet Take 0.5 tablets (12.5 mg total) by mouth 2 (two) times daily with a meal. 45 tablet 3  . Cholecalciferol (VITAMIN D3) 2000 UNITS TABS Take 2,000 Units by mouth daily.     . furosemide (LASIX) 20 MG tablet Take 1 tablet (20 mg total) by mouth every other day. 30 tablet 3  . gabapentin (NEURONTIN) 400 MG capsule TAKE 1 CAPSULE (400 MG TOTAL) BY MOUTH 2 (TWO) TIMES DAILY. 180 capsule 0  . glucose blood (ACCU-CHEK SMARTVIEW) test strip Use to test blood sugar 4 times daily as instructed. Dx code E11.59 200 each 11  . glycopyrrolate (ROBINUL) 1 MG tablet TAKE 1 TABLET (1 MG TOTAL) BY MOUTH 2 (TWO) TIMES DAILY. 180 tablet 0  . Insulin Detemir (LEVEMIR FLEXTOUCH) 100 UNIT/ML Pen Inject under skin 25 units at bedtime 45  mL 1  . lisinopril (PRINIVIL,ZESTRIL) 5 MG tablet Take 1 tablet (5 mg total) by mouth daily. 90 tablet 3  . metFORMIN (GLUCOPHAGE-XR) 500 MG 24 hr tablet TAKE 1 TABLET TWICE A DAY WITH A MEAL 180 tablet 0  . montelukast (SINGULAIR) 10 MG tablet Take 1 tablet (10 mg total) by mouth daily. 90 tablet 3  . Multiple Vitamins-Minerals (EYE VITAMINS PO) Take 1 tablet by mouth 2 (two) times daily.     . nitroGLYCERIN (NITROSTAT) 0.4 MG SL tablet Place 1 tablet (0.4 mg total) under the tongue every 5 (five) minutes as needed for chest pain. 25 tablet  1  . nizatidine (AXID) 150 MG capsule Take 1 capsule by mouth daily. Must keep 04/29/16 appt for future refills 30 capsule 0  . NOVOLOG 100 UNIT/ML injection INJECT 6-12 UNITS INTO THE SKIN 3 (THREE) TIMES DAILY BEFORE MEALS. 10 mL 2  . omeprazole (PRILOSEC) 20 MG capsule Take 1 capsule (20 mg total) by mouth daily. Must keep 04/29/16 appt for future refills 30 capsule 0  . rosuvastatin (CRESTOR) 20 MG tablet Take 1 tablet (20 mg total) by mouth daily. 30 tablet 6  . traMADol (ULTRAM) 50 MG tablet Take 50 mg by mouth every 6 (six) hours as needed (pain).    [name] venlafaxine (EFFEXOR) 37.5 MG tablet TAKE 1 TABLET BY MOUTH 2 (TWO) TIMES DAILY. APPOINTMENT NEEDED FOR FUTURE REFILLS. 60 tablet 0  . Vitamin D, Ergocalciferol, (DRISDOL) 50000 units CAPS capsule Take 1 capsule (50,000 Units total) by mouth once a week. 12 capsule 3  . warfarin (COUMADIN) 1 MG tablet TAKE AS DIRECTED BY COUMADIN CLINIC 40 tablet 3   No current facility-administered medications for this visit.     Allergies:   Darvon; Promethazine hcl; and Plavix [clopidogrel bisulfate]   Social History: Social History   Social History  . Marital status: Divorced    Spouse name: N/A  . Number of children: 3  . Years of education: 12   Occupational History  . Retried    Social History Main Topics  . Smoking status: Former Smoker    Quit date: 08/16/1995  . Smokeless tobacco: Never Used  . Alcohol use No  . Drug use: No  . Sexual activity: No   Other Topics Concern  . Not on file   Social History Narrative   Regular exercise-no   Caffeine Use-no    Family History: Family History  Problem Relation Age of Onset  . Diabetes Mother   . Heart disease Mother   . Hyperlipidemia Mother   . Hypertension Mother   . Heart disease Father   . Heart attack Father   . Hypertension Father   . Early death Brother 31  . Breast cancer Sister   . Lung cancer Sister   . Irritable bowel syndrome Sister   . Esophageal cancer Neg Hx     . Colon cancer Maternal Aunt   . Colon polyps Neg Hx     Review of Systems: All other systems reviewed and are otherwise negative except as noted above.   Physical Exam: VS:  BP 122/62   Pulse 62   Ht '5\' 3"'$  (1.6 m)   Wt 197 lb (89.4 kg)   SpO2 94%   BMI 34.90 kg/m  , BMI Body mass index is 34.9 kg/m.  GEN- The patient is elderly appearing, alert and oriented x 3 today.   HEENT: normocephalic, atraumatic; sclera clear, conjunctiva pink; hearing intact; oropharynx clear; neck supple  Lungs- Clear to ausculation bilaterally, normal work of breathing.  No wheezes, rales, rhonchi Heart- Regular rate and rhythm  GI- soft, non-tender, non-distended, bowel sounds present  Extremities- no clubbing, cyanosis, +dependent edema MS- no significant deformity or atrophy Skin- warm and dry, no rash or lesion; ICD pocket well healed Psych- euthymic mood, full affect Neuro- strength and sensation are intact  ICD interrogation- reviewed in detail today,  See PACEART report  EKG:  EKG is not ordered today.  Recent Labs: 01/07/2016: ALT 12; BUN 27; Creatinine, Ser 1.44; Hemoglobin 12.6; Platelets 274; Potassium 4.6; Sodium 146; TSH 4.360   Wt Readings from Last 3 Encounters:  05/13/16 197 lb (89.4 kg)  01/07/16 196 lb 12 oz (89.2 kg)  07/30/15 193 lb 4 oz (87.7 kg)     Other studies Reviewed: Additional studies/ records that were reviewed today include: Dr Rayann Heman and Dr Claris Gladden office notes  Assessment and Plan:  1.  Chronic systolic dysfunction euvolemic today Stable on an appropriate medical regimen Normal ICD function See Pace Art report No changes today STJ device recall discussed with patient again today. Importance of remote follow up stressed. ICM clinic not an option with ST device.   2.  Ventricular tachycardia No recent recurrence Continue amiodarone  Recent TSH, LFT's reviewed. Annual eye exams recommended.  3.  Paroxysmal atrial fibrillation Burden by device  interrogation 0% Continue Warfarin for CHADS2VASC of 8     Current medicines are reviewed at length with the patient today.   The patient does not have concerns regarding her medicines.  The following changes were made today:  none  Labs/ tests ordered today include: none Orders Placed This Encounter  Procedures  . CUP PACEART INCLINIC DEVICE CHECK     Disposition:   Follow up with Dr Aundra Dubin as scheduled, Delilah Shan, Dr Rayann Heman 1 year      Signed, Chanetta Marshall, NP 05/13/2016 2:13 PM  [facility] [address] [place] [address] [phone] (office) [phone] (fax)

## 2016-05-13 ENCOUNTER — Ambulatory Visit (INDEPENDENT_AMBULATORY_CARE_PROVIDER_SITE_OTHER): Payer: Medicare PPO | Admitting: Nurse Practitioner

## 2016-05-13 ENCOUNTER — Telehealth (HOSPITAL_COMMUNITY): Payer: Self-pay | Admitting: Vascular Surgery

## 2016-05-13 VITALS — BP 122/62 | HR 62 | Ht 63.0 in | Wt 197.0 lb

## 2016-05-13 DIAGNOSIS — I472 Ventricular tachycardia, unspecified: Secondary | ICD-10-CM

## 2016-05-13 DIAGNOSIS — I5022 Chronic systolic (congestive) heart failure: Secondary | ICD-10-CM | POA: Diagnosis not present

## 2016-05-13 DIAGNOSIS — I480 Paroxysmal atrial fibrillation: Secondary | ICD-10-CM | POA: Diagnosis not present

## 2016-05-13 LAB — CUP PACEART INCLINIC DEVICE CHECK
Date Time Interrogation Session: 20180221130026
Implantable Lead Implant Date: 20070117
Implantable Lead Location: 753859
Implantable Lead Location: 753860
Implantable Lead Model: 7040
MDC IDC LEAD IMPLANT DT: 20070117
MDC IDC PG IMPLANT DT: 20130212
MDC IDC PG SERIAL: 819806

## 2016-05-13 NOTE — Patient Instructions (Signed)
Medication Instructions:   Your physician recommends that you continue on your current medications as directed. Please refer to the Current Medication list given to you today.   If you need a refill on your cardiac medications before your next appointment, please call your pharmacy.  Labwork: NONE ORDERED  TODAY    Testing/Procedures: NONE ORDERED  TODAY    Follow-Up:  WITH DR [place] NEXT AVAILABLE   Your physician wants you to follow-up in: [place] will receive a reminder letter in the mail two months in advance. If you don't receive a letter, please call our office to schedule the follow-up appointment.  Remote monitoring is used to monitor your Pacemaker of ICD from home. This monitoring reduces the number of office visits required to check your device to one time per year. It allows [name] to keep an eye on the functioning of your device to ensure it is working properly. You are scheduled for a device check from home on . 08/11/2016..You may send your transmission at any time that day. If you have a wireless device, the transmission will be sent automatically. After your physician reviews your transmission, you will receive a postcard with your next transmission date.   Any Other Special Instructions Will Be Listed Below (If Applicable).

## 2016-05-13 NOTE — Telephone Encounter (Signed)
Left pt message to make appt w/ [facility]

## 2016-05-22 ENCOUNTER — Encounter: Payer: Medicare PPO | Admitting: Internal Medicine

## 2016-06-03 DIAGNOSIS — I4891 Unspecified atrial fibrillation: Secondary | ICD-10-CM | POA: Diagnosis not present

## 2016-06-09 ENCOUNTER — Encounter (HOSPITAL_COMMUNITY): Payer: Medicare PPO

## 2016-06-12 DIAGNOSIS — E119 Type 2 diabetes mellitus without complications: Secondary | ICD-10-CM | POA: Diagnosis not present

## 2016-06-13 ENCOUNTER — Other Ambulatory Visit: Payer: Self-pay | Admitting: Internal Medicine

## 2016-06-25 ENCOUNTER — Ambulatory Visit (INDEPENDENT_AMBULATORY_CARE_PROVIDER_SITE_OTHER): Payer: Medicare PPO | Admitting: Internal Medicine

## 2016-06-25 ENCOUNTER — Other Ambulatory Visit (INDEPENDENT_AMBULATORY_CARE_PROVIDER_SITE_OTHER): Payer: Medicare PPO

## 2016-06-25 VITALS — BP 132/76 | HR 71 | Temp 98.4°F | Ht 63.0 in | Wt 198.0 lb

## 2016-06-25 DIAGNOSIS — E1151 Type 2 diabetes mellitus with diabetic peripheral angiopathy without gangrene: Secondary | ICD-10-CM

## 2016-06-25 DIAGNOSIS — E1165 Type 2 diabetes mellitus with hyperglycemia: Secondary | ICD-10-CM

## 2016-06-25 DIAGNOSIS — Z1159 Encounter for screening for other viral diseases: Secondary | ICD-10-CM | POA: Diagnosis not present

## 2016-06-25 DIAGNOSIS — Z0000 Encounter for general adult medical examination without abnormal findings: Secondary | ICD-10-CM

## 2016-06-25 DIAGNOSIS — IMO0002 Reserved for concepts with insufficient information to code with codable children: Secondary | ICD-10-CM

## 2016-06-25 LAB — MICROALBUMIN / CREATININE URINE RATIO
Creatinine,U: 121.5 mg/dL
Microalb Creat Ratio: 3.2 mg/g (ref 0.0–30.0)
Microalb, Ur: 3.9 mg/dL — ABNORMAL HIGH (ref 0.0–1.9)

## 2016-06-25 LAB — URINALYSIS, ROUTINE W REFLEX MICROSCOPIC
BILIRUBIN URINE: NEGATIVE
Hgb urine dipstick: NEGATIVE
KETONES UR: NEGATIVE
Nitrite: NEGATIVE
RBC / HPF: NONE SEEN (ref 0–?)
Specific Gravity, Urine: 1.02 (ref 1.000–1.030)
Urine Glucose: 250 — AB
Urobilinogen, UA: 0.2 (ref 0.0–1.0)
pH: 6 (ref 5.0–8.0)

## 2016-06-25 NOTE — Progress Notes (Signed)
Subjective:    Patient ID: [name], female    DOB: 05/07/1945, 71 y.o.   MRN: [id number]  HPI  Here for wellness and f/u;  Overall doing ok;  Pt denies Chest pain, worsening SOB, DOE, wheezing, orthopnea, PND, worsening LE edema, palpitations, dizziness or syncope.  Pt denies neurological change such as new headache, facial or extremity weakness.  Pt denies polydipsia, polyuria, or low sugar symptoms. Pt states overall good compliance with treatment and medications, good tolerability, and has been trying to follow appropriate diet.  Pt denies worsening depressive symptoms, suicidal ideation or panic. No fever, night sweats, wt loss, loss of appetite, or other constitutional symptoms.  Pt states good ability with ADL's, has low fall risk, home safety reviewed and adequate, no other significant changes in hearing or vision, and only occasionally active with exercise. Seeing ortho in [place] for knees with repeated cortisone.  Living in [place] now. Due for optho appt later this month. Declines prevnar for now, may have gotten already in [place].  , delcines mammogram due to PPM  Due for hep c screen. No other hx changes Past Medical History:  Diagnosis Date  . Allergy   . Arthritis   . Atrial fibrillation ([place])    controlled with amiodarone, on coumadin  . Chronic renal insufficiency   . Chronic systolic heart failure ([place])   . Congestive heart failure ([place])   . Coronary artery disease 01/31/2011  . Diabetes mellitus    type 1  . 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   . History of chicken pox   . Hyperlipidemia   . Hypertension   . Ischemic cardiomyopathy   . Lumbar spondylosis 01/11/2012  . Stroke [facility]) 2010   eye doctor said she had TIA  . Ventricular tachycardia ([place])    Polymorphic   Past Surgical History:  Procedure Laterality Date  . ABDOMINAL HYSTERECTOMY  1995  . CARDIAC DEFIBRILLATOR PLACEMENT    . CHOLECYSTECTOMY  1985  . ICD  2003/2007   implanted by Dr Rollene Fare, most recent generator change 2/13 by Dr Rayann Heman, Analyze ST study patient  . IMPLANTABLE CARDIOVERTER DEFIBRILLATOR (ICD) GENERATOR CHANGE N/A 05/05/2011   Procedure: ICD GENERATOR CHANGE;  Surgeon: Thompson Grayer, MD;  Location: [facility] CATH LAB;  Service: Cardiovascular;  Laterality: N/A;  . LEFT HEART CATHETERIZATION WITH CORONARY ANGIOGRAM N/A 01/30/2011   Procedure: LEFT HEART CATHETERIZATION WITH CORONARY ANGIOGRAM;  Surgeon: Larey Dresser, MD;  Location: [facility] CATH LAB;  Service: Cardiovascular;  Laterality: N/A;  . TUBALIGATION  1980    reports that she quit smoking about 20 years ago. She has never used smokeless tobacco. She reports that she does not drink alcohol or use drugs. family history includes Breast cancer in her sister; Colon cancer in her maternal aunt; Diabetes in her mother; Early death (age of onset: 59) in her brother; Heart attack in her father; Heart disease in her father and mother; Hyperlipidemia in her mother; Hypertension in her father and mother; Irritable bowel syndrome in her sister; Lung cancer in her sister. Allergies  Allergen Reactions  . Darvon Other (See Comments)    indigestion  . Promethazine Hcl Other (See Comments)    hyperactivity  . Plavix [Clopidogrel Bisulfate] Rash   Current Outpatient Prescriptions on File Prior to Visit  Medication Sig Dispense Refill  . amiodarone (PACERONE) 200 MG tablet TAKE 1 TABLET (200 MG TOTAL) BY MOUTH DAILY. 90 tablet 3  . Blood Glucose Monitoring Suppl (ACCU-CHEK NANO SMARTVIEW) W/DEVICE  KIT Use to test blood sugar 4 times daily as instructed. Dx code: E11.59 1 kit 0  . carvedilol (COREG) 25 MG tablet Take 0.5 tablets (12.5 mg total) by mouth 2 (two) times daily with a meal. 45 tablet 3  . Cholecalciferol (VITAMIN D3) 2000 UNITS TABS Take 2,000 Units by mouth daily.     . furosemide (LASIX) 20 MG tablet Take 1 tablet (20 mg total) by mouth every other day. 30 tablet 3  . gabapentin (NEURONTIN) 400 MG  capsule TAKE 1 CAPSULE (400 MG TOTAL) BY MOUTH 2 (TWO) TIMES DAILY. 180 capsule 0  . glucose blood (ACCU-CHEK SMARTVIEW) test strip Use to test blood sugar 4 times daily as instructed. Dx code E11.59 200 each 11  . Insulin Detemir (LEVEMIR FLEXTOUCH) 100 UNIT/ML Pen Inject under skin 25 units at bedtime (Patient taking differently: 28 Units. Inject under skin 25 units at bedtime) 45 mL 1  . lisinopril (PRINIVIL,ZESTRIL) 5 MG tablet Take 1 tablet (5 mg total) by mouth daily. 90 tablet 3  . metFORMIN (GLUCOPHAGE-XR) 500 MG 24 hr tablet TAKE 1 TABLET TWICE A DAY WITH A MEAL 180 tablet 0  . montelukast (SINGULAIR) 10 MG tablet TAKE 1 TABLET (10 MG TOTAL) BY MOUTH DAILY. 90 tablet 3  . Multiple Vitamins-Minerals (EYE VITAMINS PO) Take 1 tablet by mouth 2 (two) times daily.     . nitroGLYCERIN (NITROSTAT) 0.4 MG SL tablet Place 1 tablet (0.4 mg total) under the tongue every 5 (five) minutes as needed for chest pain. 25 tablet 1  . nizatidine (AXID) 150 MG capsule Take 1 capsule by mouth daily. Must keep 04/29/16 appt for future refills 30 capsule 0  . NOVOLOG 100 UNIT/ML injection INJECT 6-12 UNITS INTO THE SKIN 3 (THREE) TIMES DAILY BEFORE MEALS. (Patient taking differently: INJECT 28 UNITS INTO THE SKIN 3 (THREE) TIMES DAILY BEFORE MEALS.) 10 mL 2  . omeprazole (PRILOSEC) 20 MG capsule Take 1 capsule (20 mg total) by mouth daily. Must keep 04/29/16 appt for future refills 30 capsule 0  . rosuvastatin (CRESTOR) 20 MG tablet Take 1 tablet (20 mg total) by mouth daily. 30 tablet 6  . traMADol (ULTRAM) 50 MG tablet Take 50 mg by mouth every 6 (six) hours as needed (pain).    . Vitamin D, Ergocalciferol, (DRISDOL) 50000 units CAPS capsule Take 1 capsule (50,000 Units total) by mouth once a week. 12 capsule 3  . warfarin (COUMADIN) 1 MG tablet TAKE AS DIRECTED BY COUMADIN CLINIC 40 tablet 3   No current facility-administered medications on file prior to visit.    Review of Systems Constitutional: Negative for  other unusual diaphoresis, sweats, appetite or weight changes HENT: Negative for other worsening hearing loss, ear pain, facial swelling, mouth sores or neck stiffness.   Eyes: Negative for other worsening pain, redness or other visual disturbance.  Respiratory: Negative for other stridor or swelling Cardiovascular: Negative for other palpitations or other chest pain  Gastrointestinal: Negative for worsening diarrhea or loose stools, blood in stool, distention or other pain Genitourinary: Negative for hematuria, flank pain or other change in urine volume.  Musculoskeletal: Negative for myalgias or other joint swelling.  Skin: Negative for other color change, or other wound or worsening drainage.  Neurological: Negative for other syncope or numbness. Hematological: Negative for other adenopathy or swelling Psychiatric/Behavioral: Negative for hallucinations, other worsening agitation, SI, self-injury, or new decreased concentration All other system neg per pt    Objective:   Physical Exam BP 132/76  Pulse 71   Temp 98.4 F (36.9 C) (Oral)   Ht '5\' 3"'$  (1.6 m)   Wt 198 lb (89.8 kg)   SpO2 98%   BMI 35.07 kg/m  VS noted, obese Constitutional: Pt is oriented to person, place, and time. Appears well-developed and well-nourished, in no significant distress and comfortable Head: Normocephalic and atraumatic  Eyes: Conjunctivae and EOM are normal. Pupils are equal, round, and reactive to light Right Ear: External ear normal without discharge Left Ear: External ear normal without discharge Nose: Nose without discharge or deformity Mouth/Throat: Oropharynx is without other ulcerations and moist  Neck: Normal range of motion. Neck supple. No JVD present. No tracheal deviation present or significant neck LA or mass Cardiovascular: Normal rate, regular rhythm, normal heart sounds and intact distal pulses.   Pulmonary/Chest: WOB normal and breath sounds without rales or wheezing  Abdominal:  Soft. Bowel sounds are normal. NT. No HSM  Musculoskeletal: Normal range of motion. Exhibits no edema Lymphadenopathy: Has no other cervical adenopathy.  Neurological: Pt is alert and oriented to person, place, and time. Pt has normal reflexes. No cranial nerve deficit. Motor grossly intact, Gait intact Skin: Skin is warm and dry. No rash noted or new ulcerations Psychiatric:  Has normal mood and affect. Behavior is normal without agitation No other exam findings    Assessment & Plan:

## 2016-06-25 NOTE — Assessment & Plan Note (Signed)
stable overall by history and exam, recent data reviewed with pt, and pt to continue medical treatment as before,  to f/u any worsening symptoms or concerns Lab Results  Component Value Date   HGBA1C 6.7 04/15/2015

## 2016-06-25 NOTE — Progress Notes (Signed)
Pre visit review using our clinic review tool, if applicable. No additional management support is needed unless otherwise documented below in the visit note. 

## 2016-06-25 NOTE — Assessment & Plan Note (Signed)

## 2016-06-25 NOTE — Patient Instructions (Signed)
Please continue all other medications as before, and refills have been done if requested.  Please have the pharmacy call with any other refills you may need.  Please continue your efforts at being more active, low cholesterol diet, and weight control.  You are otherwise up to date with prevention measures today.  Please keep your appointments with your specialists as you may have planned  Please go to the LAB in the Basement (turn left off the elevator) for the tests to be done today  You will be contacted by phone if any changes need to be made immediately.  Otherwise, you will receive a letter about your results with an explanation, but please check with MyChart first.  Please remember to sign up for MyChart if you have not done so, as this will be important to you in the future with finding out test results, communicating by private email, and scheduling acute appointments online when needed.  If you have Medicare related insurance (such as traditional Medicare, Blue [employer] or [employer], or similar), Please make an appointment at the [facility] with Sharee Pimple, the [employer], for your Wellness Visit in this office, which is a benefit with your insurance.  Please return in 6 months, or sooner if needed, with Lab testing done 3-5 days before

## 2016-06-26 ENCOUNTER — Other Ambulatory Visit: Payer: Self-pay | Admitting: Internal Medicine

## 2016-06-26 ENCOUNTER — Telehealth: Payer: Self-pay

## 2016-06-26 LAB — BASIC METABOLIC PANEL
BUN: 39 mg/dL — ABNORMAL HIGH (ref 6–23)
CALCIUM: 9.1 mg/dL (ref 8.4–10.5)
CO2: 24 meq/L (ref 19–32)
Chloride: 113 mEq/L — ABNORMAL HIGH (ref 96–112)
Creatinine, Ser: 1.43 mg/dL — ABNORMAL HIGH (ref 0.40–1.20)
GFR: 38.63 mL/min — ABNORMAL LOW (ref >60.00)
GLUCOSE: 185 mg/dL — AB (ref 70–99)
Potassium: 4.6 mEq/L (ref 3.5–5.1)
Sodium: 145 mEq/L (ref 135–145)

## 2016-06-26 LAB — CBC WITH DIFFERENTIAL/PLATELET
BASOS ABS: 0 10*3/uL (ref 0.0–0.1)
Basophils Relative: 0.7 % (ref 0.0–3.0)
EOS ABS: 0.1 10*3/uL (ref 0.0–0.7)
Eosinophils Relative: 1.7 % (ref 0.0–5.0)
HEMATOCRIT: 37.7 % (ref 36.0–46.0)
HEMOGLOBIN: 12.6 g/dL (ref 12.0–15.0)
Lymphocytes Relative: 18.7 % (ref 12.0–46.0)
Lymphs Abs: 1 10*3/uL (ref 0.7–4.0)
MCHC: 33.5 g/dL (ref 30.0–36.0)
MCV: 93.6 fl (ref 78.0–100.0)
MONOS PCT: 7.2 % (ref 3.0–12.0)
Monocytes Absolute: 0.4 10*3/uL (ref 0.1–1.0)
Neutro Abs: 3.7 10*3/uL (ref 1.4–7.7)
Neutrophils Relative %: 71.7 % (ref 43.0–77.0)
Platelets: 245 10*3/uL (ref 150.0–400.0)
RBC: 4.03 Mil/uL (ref 3.87–5.11)
RDW: 14.5 % (ref 11.5–15.5)
WBC: 5.2 10*3/uL (ref 4.0–10.5)

## 2016-06-26 LAB — HEPATIC FUNCTION PANEL
ALBUMIN: 3.7 g/dL (ref 3.5–5.2)
ALT: 12 U/L (ref 0–35)
AST: 12 U/L (ref 0–37)
Alkaline Phosphatase: 60 U/L (ref 39–117)
BILIRUBIN DIRECT: 0.1 mg/dL (ref 0.0–0.3)
TOTAL PROTEIN: 6.5 g/dL (ref 6.0–8.3)
Total Bilirubin: 0.5 mg/dL (ref 0.2–1.2)

## 2016-06-26 LAB — LIPID PANEL
Cholesterol: 148 mg/dL (ref 0–200)
HDL: 53 mg/dL (ref >39.00)
LDL Cholesterol: 80 mg/dL (ref 0–99)
NONHDL: 95.08
TRIGLYCERIDES: 74 mg/dL (ref 0.0–149.0)
Total CHOL/HDL Ratio: 3
VLDL: 14.8 mg/dL (ref 0.0–40.0)

## 2016-06-26 LAB — TSH: TSH: 4.01 u[IU]/mL (ref 0.35–4.50)

## 2016-06-26 LAB — HEMOGLOBIN A1C: HEMOGLOBIN A1C: 8.2 % — AB (ref 4.6–6.5)

## 2016-06-26 MED ORDER — METFORMIN HCL ER 500 MG PO TB24: 1500.0000 mg | ORAL_TABLET | Freq: Every day | ORAL | 3 refills | Status: DC

## 2016-06-26 NOTE — Telephone Encounter (Signed)
Patient was informed and expressed understanding.  

## 2016-06-26 NOTE — Telephone Encounter (Signed)
-----   Message from Biagio Borg, MD sent at 06/26/2016 12:59 PM EDT ----- Left message on MyChart, pt to cont same tx except  The test results show that your current treatment is OK, except the A1c is too high.  Please increase the metformin ER 500 mg to 3 tabs in the AM (does not have to be taken twice per day).  OK to continue all other medication at the same dosing.    Sh[place]ron to please inform pt, I will do rx

## 2016-06-27 LAB — HEPATITIS C ANTIBODY: HCV AB: NEGATIVE

## 2016-07-02 DIAGNOSIS — J329 Chronic sinusitis, unspecified: Secondary | ICD-10-CM | POA: Diagnosis not present

## 2016-07-02 DIAGNOSIS — Z7984 Long term (current) use of oral hypoglycemic drugs: Secondary | ICD-10-CM | POA: Diagnosis not present

## 2016-07-02 DIAGNOSIS — I4891 Unspecified atrial fibrillation: Secondary | ICD-10-CM | POA: Diagnosis not present

## 2016-07-02 DIAGNOSIS — J302 Other seasonal allergic rhinitis: Secondary | ICD-10-CM | POA: Diagnosis not present

## 2016-07-02 DIAGNOSIS — E11649 Type 2 diabetes mellitus with hypoglycemia without coma: Secondary | ICD-10-CM | POA: Diagnosis not present

## 2016-07-02 DIAGNOSIS — R0981 Nasal congestion: Secondary | ICD-10-CM | POA: Diagnosis not present

## 2016-07-02 DIAGNOSIS — R42 Dizziness and giddiness: Secondary | ICD-10-CM | POA: Diagnosis not present

## 2016-07-02 DIAGNOSIS — I10 Essential (primary) hypertension: Secondary | ICD-10-CM | POA: Diagnosis not present

## 2016-07-02 DIAGNOSIS — I2510 Atherosclerotic heart disease of native coronary artery without angina pectoris: Secondary | ICD-10-CM | POA: Diagnosis not present

## 2016-07-02 DIAGNOSIS — I959 Hypotension, unspecified: Secondary | ICD-10-CM | POA: Diagnosis not present

## 2016-07-02 DIAGNOSIS — N289 Disorder of kidney and ureter, unspecified: Secondary | ICD-10-CM | POA: Diagnosis not present

## 2016-07-02 DIAGNOSIS — Z794 Long term (current) use of insulin: Secondary | ICD-10-CM | POA: Diagnosis not present

## 2016-07-02 DIAGNOSIS — R05 Cough: Secondary | ICD-10-CM | POA: Diagnosis not present

## 2016-07-02 DIAGNOSIS — B9789 Other viral agents as the cause of diseases classified elsewhere: Secondary | ICD-10-CM | POA: Diagnosis not present

## 2016-07-14 ENCOUNTER — Encounter (HOSPITAL_COMMUNITY): Payer: Medicare PPO

## 2016-07-27 DIAGNOSIS — M1711 Unilateral primary osteoarthritis, right knee: Secondary | ICD-10-CM | POA: Diagnosis not present

## 2016-07-27 DIAGNOSIS — I4891 Unspecified atrial fibrillation: Secondary | ICD-10-CM | POA: Diagnosis not present

## 2016-07-27 DIAGNOSIS — M1712 Unilateral primary osteoarthritis, left knee: Secondary | ICD-10-CM | POA: Diagnosis not present

## 2016-07-27 DIAGNOSIS — E119 Type 2 diabetes mellitus without complications: Secondary | ICD-10-CM | POA: Diagnosis not present

## 2016-07-27 DIAGNOSIS — I509 Heart failure, unspecified: Secondary | ICD-10-CM | POA: Diagnosis not present

## 2016-08-10 ENCOUNTER — Encounter (HOSPITAL_COMMUNITY): Payer: Medicare PPO

## 2016-08-11 DIAGNOSIS — I4891 Unspecified atrial fibrillation: Secondary | ICD-10-CM | POA: Diagnosis not present

## 2016-08-21 DIAGNOSIS — I4891 Unspecified atrial fibrillation: Secondary | ICD-10-CM | POA: Diagnosis not present

## 2016-08-31 DIAGNOSIS — M170 Bilateral primary osteoarthritis of knee: Secondary | ICD-10-CM | POA: Diagnosis not present

## 2016-09-04 DIAGNOSIS — I4891 Unspecified atrial fibrillation: Secondary | ICD-10-CM | POA: Diagnosis not present

## 2016-09-07 ENCOUNTER — Other Ambulatory Visit: Payer: Self-pay | Admitting: Internal Medicine

## 2016-09-09 DIAGNOSIS — M170 Bilateral primary osteoarthritis of knee: Secondary | ICD-10-CM | POA: Diagnosis not present

## 2016-09-16 DIAGNOSIS — M170 Bilateral primary osteoarthritis of knee: Secondary | ICD-10-CM | POA: Diagnosis not present

## 2016-10-02 DIAGNOSIS — I4891 Unspecified atrial fibrillation: Secondary | ICD-10-CM | POA: Diagnosis not present

## 2016-10-08 ENCOUNTER — Encounter (HOSPITAL_COMMUNITY): Payer: Medicare PPO | Admitting: Cardiology

## 2016-10-19 ENCOUNTER — Telehealth: Payer: Self-pay | Admitting: Cardiology

## 2016-10-19 ENCOUNTER — Ambulatory Visit (INDEPENDENT_AMBULATORY_CARE_PROVIDER_SITE_OTHER): Payer: Medicare PPO | Admitting: *Deleted

## 2016-10-19 ENCOUNTER — Telehealth: Payer: Self-pay | Admitting: Internal Medicine

## 2016-10-19 DIAGNOSIS — I255 Ischemic cardiomyopathy: Secondary | ICD-10-CM | POA: Diagnosis not present

## 2016-10-19 NOTE — Telephone Encounter (Signed)
New message     Returning your call about her device

## 2016-10-19 NOTE — Progress Notes (Signed)
Remote ICD transmission.   

## 2016-10-19 NOTE — Telephone Encounter (Signed)
LMOVM requesting that pt send manual transmission b/c home monitor has not updated in at least 7 days.    

## 2016-10-19 NOTE — Telephone Encounter (Signed)
Spoke w/ pt and instructed her to call tech support to receive help trouble shooting her home monitor. Pt verbalized understanding.

## 2016-10-23 ENCOUNTER — Encounter: Payer: Self-pay | Admitting: Cardiology

## 2016-10-26 ENCOUNTER — Other Ambulatory Visit (HOSPITAL_COMMUNITY): Payer: Self-pay | Admitting: Cardiology

## 2016-10-26 MED ORDER — ROSUVASTATIN CALCIUM 20 MG PO TABS: 20.0000 mg | ORAL_TABLET | Freq: Every day | ORAL | 3 refills | Status: DC

## 2016-11-24 LAB — CUP PACEART REMOTE DEVICE CHECK
Battery Remaining Longevity: 36 mo
Battery Remaining Percentage: 36 %
Brady Statistic AP VS Percent: 95 %
Brady Statistic AS VS Percent: 2 %
HIGH POWER IMPEDANCE MEASURED VALUE: 49 Ohm
Implantable Lead Implant Date: 20070117
Implantable Lead Implant Date: 20070117
Implantable Lead Location: 753860
Implantable Pulse Generator Implant Date: 20130212
Lead Channel Impedance Value: 700 Ohm
Lead Channel Pacing Threshold Amplitude: 0.5 V
Lead Channel Pacing Threshold Pulse Width: 0.5 ms
Lead Channel Sensing Intrinsic Amplitude: 3.7 mV
Lead Channel Setting Pacing Amplitude: 2.5 V
Lead Channel Setting Sensing Sensitivity: 0.5 mV
MDC IDC LEAD LOCATION: 753859
MDC IDC MSMT BATTERY VOLTAGE: 2.9 V
MDC IDC MSMT LEADCHNL RA IMPEDANCE VALUE: 530 Ohm
MDC IDC MSMT LEADCHNL RV PACING THRESHOLD AMPLITUDE: 1 V
MDC IDC MSMT LEADCHNL RV PACING THRESHOLD PULSEWIDTH: 0.5 ms
MDC IDC MSMT LEADCHNL RV SENSING INTR AMPL: 11.8 mV
MDC IDC SESS DTM: 20180730170616
MDC IDC SET LEADCHNL RA PACING AMPLITUDE: 2 V
MDC IDC SET LEADCHNL RV PACING PULSEWIDTH: 0.5 ms
MDC IDC STAT BRADY AP VP PERCENT: 2.6 %
MDC IDC STAT BRADY AS VP PERCENT: 1 %
MDC IDC STAT BRADY RA PERCENT PACED: 96 %
MDC IDC STAT BRADY RV PERCENT PACED: 2.8 %
Pulse Gen Serial Number: 819806

## 2016-12-18 ENCOUNTER — Other Ambulatory Visit: Payer: Self-pay | Admitting: Pharmacist

## 2016-12-18 NOTE — Patient Outreach (Signed)
Outreach call to [employer] to discuss medication adherence to patient's rosuvastatin for [employer]. Left a HIPAA compliant message on the patient's voicemail. If have not heard from patient by next week, will give her another call at that time  Harlow Asa, PharmD, [facility] [phone]

## 2016-12-18 NOTE — Patient Outreach (Signed)
Receive a call from [facility] in response to my voicemail. HIPAA identifiers verified and verbal consent received.  Patient reports that she takes her rosuvastatin as directed once daily. Denies any missed doses. Denies any barrier to adherence. Counsel patient on the importance of medication adherence. Patient verbalizes understanding.  Patient denies any further medication questions/concerns.   Harlow Asa, PharmD, [facility] [phone]

## 2016-12-25 ENCOUNTER — Ambulatory Visit: Payer: Medicare PPO | Admitting: Internal Medicine

## 2017-01-05 ENCOUNTER — Ambulatory Visit (INDEPENDENT_AMBULATORY_CARE_PROVIDER_SITE_OTHER): Payer: Medicare PPO | Admitting: Internal Medicine

## 2017-01-05 ENCOUNTER — Other Ambulatory Visit (INDEPENDENT_AMBULATORY_CARE_PROVIDER_SITE_OTHER): Payer: Medicare PPO

## 2017-01-05 ENCOUNTER — Encounter: Payer: Self-pay | Admitting: Internal Medicine

## 2017-01-05 VITALS — BP 126/74 | HR 81 | Temp 98.1°F | Ht 63.0 in | Wt 199.0 lb

## 2017-01-05 DIAGNOSIS — IMO0002 Reserved for concepts with insufficient information to code with codable children: Secondary | ICD-10-CM

## 2017-01-05 DIAGNOSIS — Z23 Encounter for immunization: Secondary | ICD-10-CM

## 2017-01-05 DIAGNOSIS — E1165 Type 2 diabetes mellitus with hyperglycemia: Secondary | ICD-10-CM

## 2017-01-05 DIAGNOSIS — I10 Essential (primary) hypertension: Secondary | ICD-10-CM | POA: Diagnosis not present

## 2017-01-05 DIAGNOSIS — E1151 Type 2 diabetes mellitus with diabetic peripheral angiopathy without gangrene: Secondary | ICD-10-CM

## 2017-01-05 DIAGNOSIS — N183 Chronic kidney disease, stage 3 unspecified: Secondary | ICD-10-CM

## 2017-01-05 LAB — HEMOGLOBIN A1C: HEMOGLOBIN A1C: 8.1 % — AB (ref 4.6–6.5)

## 2017-01-05 LAB — BASIC METABOLIC PANEL
BUN: 31 mg/dL — ABNORMAL HIGH (ref 6–23)
CHLORIDE: 109 meq/L (ref 96–112)
CO2: 26 meq/L (ref 19–32)
Calcium: 9.4 mg/dL (ref 8.4–10.5)
Creatinine, Ser: 1.67 mg/dL — ABNORMAL HIGH (ref 0.40–1.20)
GFR: 32.24 mL/min — ABNORMAL LOW (ref >60.00)
GLUCOSE: 93 mg/dL (ref 70–99)
POTASSIUM: 4.7 meq/L (ref 3.5–5.1)
Sodium: 145 mEq/L (ref 135–145)

## 2017-01-05 LAB — HEPATIC FUNCTION PANEL
ALT: 13 U/L (ref 0–35)
AST: 14 U/L (ref 0–37)
Albumin: 3.9 g/dL (ref 3.5–5.2)
Alkaline Phosphatase: 68 U/L (ref 39–117)
BILIRUBIN TOTAL: 0.6 mg/dL (ref 0.2–1.2)
Bilirubin, Direct: 0.2 mg/dL (ref 0.0–0.3)
TOTAL PROTEIN: 6.9 g/dL (ref 6.0–8.3)

## 2017-01-05 LAB — LIPID PANEL
CHOL/HDL RATIO: 2
CHOLESTEROL: 143 mg/dL (ref 0–200)
HDL: 58.7 mg/dL (ref >39.00)
LDL Cholesterol: 66 mg/dL (ref 0–99)
NonHDL: 83.93
TRIGLYCERIDES: 90 mg/dL (ref 0.0–149.0)
VLDL: 18 mg/dL (ref 0.0–40.0)

## 2017-01-05 NOTE — Progress Notes (Signed)
Subjective:    Patient ID: [name], female    DOB: 1946/03/16, 71 y.o.   MRN: [id number]  HPI  Here to f/u; overall doing ok,  Pt denies chest pain, increasing sob or doe, wheezing, orthopnea, PND, increased LE swelling, palpitations, dizziness or syncope.  Pt denies new neurological symptoms such as new headache, or facial or extremity weakness or numbness.  Pt denies polydipsia, polyuria, or low sugar episode.  Pt states overall good compliance with meds, mostly trying to follow appropriate diet, with wt overall up several lbs again,  but little exercise however.  Has not seen endo recently.  CBG;s often in the 200;s per pt Wt Readings from Last 3 Encounters:  01/05/17 199 lb (90.3 kg)  06/25/16 198 lb (89.8 kg)  05/13/16 197 lb (89.4 kg)  C/o bilat knee pain with DJD, chronic end stage, but surgury deferred per ortho per pt due to AICD since 2007, s/p gel shots but did not help.    Asks for INR check today but is already established with [facility] Past Medical History:  Diagnosis Date  . Allergy   . Arthritis   . Atrial fibrillation ([place])    controlled with amiodarone, on coumadin  . Chronic renal insufficiency   . Chronic systolic heart failure ([place])   . Congestive heart failure ([place])   . Coronary artery disease 01/31/2011  . Diabetes mellitus    type 1  . 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   . History of chicken pox   . Hyperlipidemia   . Hypertension   . Ischemic cardiomyopathy   . Lumbar spondylosis 01/11/2012  . Stroke [facility]) 2010   eye doctor said she had TIA  . Ventricular tachycardia ([place])    Polymorphic   Past Surgical History:  Procedure Laterality Date  . ABDOMINAL HYSTERECTOMY  1995  . CARDIAC DEFIBRILLATOR PLACEMENT    . CHOLECYSTECTOMY  1985  . ICD  2003/2007   implanted by Dr Rollene Fare, most recent generator change 2/13 by Dr Rayann Heman, Analyze ST study patient  . IMPLANTABLE CARDIOVERTER DEFIBRILLATOR (ICD) GENERATOR  CHANGE N/A 05/05/2011   Procedure: ICD GENERATOR CHANGE;  Surgeon: Thompson Grayer, MD;  Location: [facility] CATH LAB;  Service: Cardiovascular;  Laterality: N/A;  . LEFT HEART CATHETERIZATION WITH CORONARY ANGIOGRAM N/A 01/30/2011   Procedure: LEFT HEART CATHETERIZATION WITH CORONARY ANGIOGRAM;  Surgeon: Larey Dresser, MD;  Location: [facility] CATH LAB;  Service: Cardiovascular;  Laterality: N/A;  . TUBALIGATION  1980    reports that she quit smoking about 21 years ago. She has never used smokeless tobacco. She reports that she does not drink alcohol or use drugs. family history includes Breast cancer in her sister; Colon cancer in her maternal aunt; Diabetes in her mother; Early death (age of onset: 92) in her brother; Heart attack in her father; Heart disease in her father and mother; Hyperlipidemia in her mother; Hypertension in her father and mother; Irritable bowel syndrome in her sister; Lung cancer in her sister. Allergies  Allergen Reactions  . Darvon Other (See Comments)    indigestion  . Promethazine Hcl Other (See Comments)    hyperactivity  . Plavix [Clopidogrel Bisulfate] Rash   Current Outpatient Prescriptions on File Prior to Visit  Medication Sig Dispense Refill  . amiodarone (PACERONE) 200 MG tablet TAKE 1 TABLET (200 MG TOTAL) BY MOUTH DAILY. 90 tablet 3  . Blood Glucose Monitoring Suppl (ACCU-CHEK NANO SMARTVIEW) W/DEVICE KIT Use to test blood sugar 4  times daily as instructed. Dx code: E11.59 1 kit 0  . carvedilol (COREG) 25 MG tablet Take 0.5 tablets (12.5 mg total) by mouth 2 (two) times daily with a meal. 45 tablet 3  . Cholecalciferol (VITAMIN D3) 2000 UNITS TABS Take 2,000 Units by mouth daily.     . furosemide (LASIX) 20 MG tablet Take 1 tablet (20 mg total) by mouth every other day. 30 tablet 3  . gabapentin (NEURONTIN) 400 MG capsule TAKE 1 CAPSULE (400 MG TOTAL) BY MOUTH 2 (TWO) TIMES DAILY. 180 capsule 0  . glucose blood (ACCU-CHEK SMARTVIEW) test strip Use to test blood sugar 4  times daily as instructed. Dx code E11.59 200 each 11  . Insulin Detemir (LEVEMIR FLEXTOUCH) 100 UNIT/ML Pen Inject under skin 25 units at bedtime (Patient taking differently: 28 Units. Inject under skin 25 units at bedtime) 45 mL 1  . lisinopril (PRINIVIL,ZESTRIL) 5 MG tablet Take 1 tablet (5 mg total) by mouth daily. 90 tablet 3  . metFORMIN (GLUCOPHAGE-XR) 500 MG 24 hr tablet Take 3 tablets (1,500 mg total) by mouth daily with breakfast. 270 tablet 3  . montelukast (SINGULAIR) 10 MG tablet TAKE 1 TABLET (10 MG TOTAL) BY MOUTH DAILY. 90 tablet 3  . Multiple Vitamins-Minerals (EYE VITAMINS PO) Take 1 tablet by mouth 2 (two) times daily.     . nitroGLYCERIN (NITROSTAT) 0.4 MG SL tablet Place 1 tablet (0.4 mg total) under the tongue every 5 (five) minutes as needed for chest pain. 25 tablet 1  . nizatidine (AXID) 150 MG capsule Take 1 capsule by mouth daily. Must keep 04/29/16 appt for future refills 30 capsule 0  . NOVOLOG 100 UNIT/ML injection INJECT 6-12 UNITS INTO THE SKIN 3 (THREE) TIMES DAILY BEFORE MEALS. (Patient taking differently: INJECT 28 UNITS INTO THE SKIN 3 (THREE) TIMES DAILY BEFORE MEALS.) 10 mL 2  . omeprazole (PRILOSEC) 20 MG capsule Take 1 capsule (20 mg total) by mouth daily. Must keep 04/29/16 appt for future refills 30 capsule 0  . rosuvastatin (CRESTOR) 20 MG tablet Take 1 tablet (20 mg total) by mouth daily. 90 tablet 3  . traMADol (ULTRAM) 50 MG tablet Take 50 mg by mouth every 6 (six) hours as needed (pain).    . Vitamin D, Ergocalciferol, (DRISDOL) 50000 units CAPS capsule Take 1 capsule (50,000 Units total) by mouth once a week. 12 capsule 3  . warfarin (COUMADIN) 1 MG tablet TAKE AS DIRECTED BY COUMADIN CLINIC 40 tablet 3   No current facility-administered medications on file prior to visit.    Review of Systems  Constitutional: Negative for other unusual diaphoresis or sweats HENT: Negative for ear discharge or swelling Eyes: Negative for other worsening visual  disturbances Respiratory: Negative for stridor or other swelling  Gastrointestinal: Negative for worsening distension or other blood Genitourinary: Negative for retention or other urinary change Musculoskeletal: Negative for other MSK pain or swelling Skin: Negative for color change or other new lesions Neurological: Negative for worsening tremors and other numbness  Psychiatric/Behavioral: Negative for worsening agitation or other fatigue All other system neg per pt    Objective:   Physical Exam BP 126/74   Pulse 81   Temp 98.1 F (36.7 C) (Oral)   Ht '5\' 3"'$  (1.6 m)   Wt 199 lb (90.3 kg)   SpO2 98%   BMI 35.25 kg/m  VS noted,  Constitutional: Pt appears in NAD HENT: Head: NCAT.  Right Ear: External ear normal.  Left Ear: External ear normal.  Eyes: . Pupils are equal, round, and reactive to light. Conjunctivae and EOM are normal Nose: without d/c or deformity Neck: Neck supple. Gross normal ROM Cardiovascular: Normal rate and regular rhythm.   Pulmonary/Chest: Effort normal and breath sounds without rales or wheezing.  Abd:  Soft, NT, ND, + BS, no organomegaly Neurological: Pt is alert. At baseline orientation, motor grossly intact Skin: Skin is warm. No rashes, other new lesions, no LE edema Psychiatric: Pt behavior is normal without agitation  No other exam findings    Assessment & Plan:

## 2017-01-05 NOTE — Patient Instructions (Addendum)
Please continue all other medications as before, and refills have been done if requested.  Please have the pharmacy call with any other refills you may need.  Please continue your efforts at being more active, low cholesterol diet, and weight control.  Please keep your appointments with your specialists as you may have planned  Please let us know if you would want referral to orthopedic in [facility] at some point  Please go to the LAB in the Basement (turn left off the elevator) for the tests to be done today  You will be contacted by phone if any changes need to be made immediately.  Otherwise, you will receive a letter about your results with an explanation, but please check with MyChart first.  Please remember to sign up for MyChart if you have not done so, as this will be important to you in the future with finding out test results, communicating by private email, and scheduling acute appointments online when needed.  Please return in 6 months, or sooner if needed, with Lab testing done 3-5 days before

## 2017-01-05 NOTE — Assessment & Plan Note (Signed)
Lab Results  Component Value Date   CREATININE 1.67 (H) 01/05/2017  stable overall by history and exam, recent data reviewed with pt, and pt to continue medical treatment as before,  to f/u any worsening symptoms or concerns

## 2017-01-05 NOTE — Assessment & Plan Note (Signed)
stable overall by history and exam, recent data reviewed with pt, and pt to continue medical treatment as before,  to f/u any worsening symptoms or concerns BP Readings from Last 3 Encounters:  01/05/17 126/74  06/25/16 132/76  05/13/16 122/62

## 2017-01-05 NOTE — Assessment & Plan Note (Addendum)
?   Control, stable overall by history and exam, refer endo to resume care, and pt to continue medical treatment as before,  to f/u any worsening symptoms or concerns

## 2017-01-07 ENCOUNTER — Telehealth: Payer: Self-pay | Admitting: Internal Medicine

## 2017-01-07 ENCOUNTER — Telehealth: Payer: Self-pay

## 2017-01-07 ENCOUNTER — Other Ambulatory Visit: Payer: Self-pay | Admitting: Internal Medicine

## 2017-01-07 MED ORDER — METFORMIN HCL ER 500 MG PO TB24: 2000.0000 mg | ORAL_TABLET | Freq: Every day | ORAL | 3 refills | Status: DC

## 2017-01-07 MED ORDER — DICYCLOMINE HCL 10 MG PO CAPS: 10.0000 mg | ORAL_CAPSULE | Freq: Three times a day (TID) | ORAL | 1 refills | Status: DC

## 2017-01-07 NOTE — Telephone Encounter (Signed)
[facility]

## 2017-01-07 NOTE — Telephone Encounter (Signed)
-----   Message from Biagio Borg, MD sent at 01/07/2017  1:08 PM EDT ----- Left message on MyChart, pt to cont same tx except  The test results show that your current treatment is OK, except the A1c is still too high, with a goal being to be less than 7.  We should increase the metformin ER to 4 pills per day.    Robbye Dede to please inform pt, I will do rx

## 2017-01-07 NOTE — Telephone Encounter (Signed)
Pt called in and said that that she was told to call in and give her the name of med she was taking.  It is Dicyclomine 10mg    Pharmacy is the one on file

## 2017-01-07 NOTE — Telephone Encounter (Signed)
Called pt, LVM.   

## 2017-01-18 ENCOUNTER — Ambulatory Visit (INDEPENDENT_AMBULATORY_CARE_PROVIDER_SITE_OTHER): Payer: Medicare PPO | Admitting: *Deleted

## 2017-01-18 DIAGNOSIS — I255 Ischemic cardiomyopathy: Secondary | ICD-10-CM

## 2017-01-18 DIAGNOSIS — I472 Ventricular tachycardia: Secondary | ICD-10-CM | POA: Diagnosis not present

## 2017-01-18 DIAGNOSIS — I4729 Other ventricular tachycardia: Secondary | ICD-10-CM

## 2017-01-19 NOTE — Progress Notes (Signed)
Remote ICD transmission.   

## 2017-01-21 ENCOUNTER — Telehealth: Payer: Self-pay | Admitting: Internal Medicine

## 2017-01-21 LAB — CUP PACEART REMOTE DEVICE CHECK
Battery Remaining Percentage: 33 %
Brady Statistic AP VP Percent: 2.5 %
Brady Statistic AP VS Percent: 96 %
Brady Statistic AS VP Percent: 1 %
Brady Statistic AS VS Percent: 1.5 %
Brady Statistic RV Percent Paced: 2.7 %
Date Time Interrogation Session: 20181029082525
HighPow Impedance: 48 Ohm
Implantable Lead Implant Date: 20070117
Implantable Lead Location: 753859
Implantable Lead Model: 7040
Lead Channel Impedance Value: 480 Ohm
Lead Channel Pacing Threshold Amplitude: 1 V
Lead Channel Sensing Intrinsic Amplitude: 11.8 mV
Lead Channel Sensing Intrinsic Amplitude: 3 mV
Lead Channel Setting Pacing Amplitude: 2 V
Lead Channel Setting Pacing Amplitude: 2.5 V
Lead Channel Setting Pacing Pulse Width: 0.5 ms
MDC IDC LEAD IMPLANT DT: 20070117
MDC IDC LEAD LOCATION: 753860
MDC IDC MSMT BATTERY REMAINING LONGEVITY: 32 mo
MDC IDC MSMT BATTERY VOLTAGE: 2.9 V
MDC IDC MSMT LEADCHNL RA PACING THRESHOLD AMPLITUDE: 0.5 V
MDC IDC MSMT LEADCHNL RA PACING THRESHOLD PULSEWIDTH: 0.5 ms
MDC IDC MSMT LEADCHNL RV IMPEDANCE VALUE: 600 Ohm
MDC IDC MSMT LEADCHNL RV PACING THRESHOLD PULSEWIDTH: 0.5 ms
MDC IDC PG IMPLANT DT: 20130212
MDC IDC PG SERIAL: 819806
MDC IDC SET LEADCHNL RV SENSING SENSITIVITY: 0.5 mV
MDC IDC STAT BRADY RA PERCENT PACED: 97 %

## 2017-01-21 MED ORDER — FUROSEMIDE 20 MG PO TABS: 20.0000 mg | ORAL_TABLET | ORAL | 0 refills | Status: DC

## 2017-01-21 NOTE — Telephone Encounter (Signed)
Pt called requesting a refill on furosemide (LASIX) 20 MG tablet [facility] [place]

## 2017-01-21 NOTE — Telephone Encounter (Signed)
Reviewed chart pt is up-to-date sent refills to [facility].../lmb  

## 2017-01-22 ENCOUNTER — Telehealth: Payer: Self-pay | Admitting: Internal Medicine

## 2017-01-22 MED ORDER — OMEPRAZOLE 20 MG PO CPDR: 20.0000 mg | DELAYED_RELEASE_CAPSULE | Freq: Every day | ORAL | 3 refills | Status: DC

## 2017-01-22 MED ORDER — OMEPRAZOLE 20 MG PO CPDR: 20.0000 mg | DELAYED_RELEASE_CAPSULE | Freq: Every day | ORAL | 1 refills | Status: DC

## 2017-01-22 NOTE — Telephone Encounter (Signed)
Sent omeprazole pls advise on vitamin D.../lmb

## 2017-01-22 NOTE — Telephone Encounter (Signed)
Notified pt w/MD response.../lmb 

## 2017-01-22 NOTE — Telephone Encounter (Signed)
Patient has called in stating that Vit D has to have script for OTC for 5000.

## 2017-01-22 NOTE — Telephone Encounter (Signed)
Pt called in and need a refill on her  omeprazole (PRILOSEC) 20 MG capsule [[id number]]  Vit D 5000  [facility] [place]

## 2017-01-22 NOTE — Telephone Encounter (Signed)
Ok done erx - prilosec  Vit D is ok for OTC

## 2017-01-26 ENCOUNTER — Encounter: Payer: Self-pay | Admitting: Cardiology

## 2017-02-05 ENCOUNTER — Telehealth (HOSPITAL_COMMUNITY): Payer: Self-pay | Admitting: Cardiology

## 2017-02-05 ENCOUNTER — Encounter (HOSPITAL_COMMUNITY): Payer: Self-pay

## 2017-02-05 ENCOUNTER — Ambulatory Visit (HOSPITAL_COMMUNITY)
Admission: RE | Admit: 2017-02-05 | Discharge: 2017-02-05 | Disposition: A | Discharge status: Home / Self Care | Payer: Medicare PPO | Source: Ambulatory Visit | Attending: Cardiology | Admitting: Cardiology

## 2017-02-05 VITALS — BP 148/70 | HR 77 | Wt 196.0 lb

## 2017-02-05 DIAGNOSIS — I351 Nonrheumatic aortic (valve) insufficiency: Secondary | ICD-10-CM | POA: Insufficient documentation

## 2017-02-05 DIAGNOSIS — I4729 Other ventricular tachycardia: Secondary | ICD-10-CM

## 2017-02-05 DIAGNOSIS — I2510 Atherosclerotic heart disease of native coronary artery without angina pectoris: Secondary | ICD-10-CM | POA: Insufficient documentation

## 2017-02-05 DIAGNOSIS — I5022 Chronic systolic (congestive) heart failure: Secondary | ICD-10-CM

## 2017-02-05 DIAGNOSIS — Z955 Presence of coronary angioplasty implant and graft: Secondary | ICD-10-CM | POA: Diagnosis not present

## 2017-02-05 DIAGNOSIS — Z7901 Long term (current) use of anticoagulants: Secondary | ICD-10-CM | POA: Insufficient documentation

## 2017-02-05 DIAGNOSIS — I129 Hypertensive chronic kidney disease with stage 1 through stage 4 chronic kidney disease, or unspecified chronic kidney disease: Secondary | ICD-10-CM | POA: Diagnosis not present

## 2017-02-05 DIAGNOSIS — Z90710 Acquired absence of both cervix and uterus: Secondary | ICD-10-CM | POA: Insufficient documentation

## 2017-02-05 DIAGNOSIS — I255 Ischemic cardiomyopathy: Secondary | ICD-10-CM | POA: Insufficient documentation

## 2017-02-05 DIAGNOSIS — K3184 Gastroparesis: Secondary | ICD-10-CM | POA: Insufficient documentation

## 2017-02-05 DIAGNOSIS — E1143 Type 2 diabetes mellitus with diabetic autonomic (poly)neuropathy: Secondary | ICD-10-CM | POA: Insufficient documentation

## 2017-02-05 DIAGNOSIS — N183 Chronic kidney disease, stage 3 unspecified: Secondary | ICD-10-CM

## 2017-02-05 DIAGNOSIS — I472 Ventricular tachycardia: Secondary | ICD-10-CM | POA: Diagnosis not present

## 2017-02-05 DIAGNOSIS — Z8673 Personal history of transient ischemic attack (TIA), and cerebral infarction without residual deficits: Secondary | ICD-10-CM | POA: Insufficient documentation

## 2017-02-05 DIAGNOSIS — I480 Paroxysmal atrial fibrillation: Secondary | ICD-10-CM | POA: Insufficient documentation

## 2017-02-05 DIAGNOSIS — I10 Essential (primary) hypertension: Secondary | ICD-10-CM

## 2017-02-05 DIAGNOSIS — E1122 Type 2 diabetes mellitus with diabetic chronic kidney disease: Secondary | ICD-10-CM | POA: Diagnosis not present

## 2017-02-05 DIAGNOSIS — Z794 Long term (current) use of insulin: Secondary | ICD-10-CM | POA: Insufficient documentation

## 2017-02-05 DIAGNOSIS — Z87891 Personal history of nicotine dependence: Secondary | ICD-10-CM | POA: Insufficient documentation

## 2017-02-05 DIAGNOSIS — Z79899 Other long term (current) drug therapy: Secondary | ICD-10-CM | POA: Insufficient documentation

## 2017-02-05 DIAGNOSIS — Z8249 Family history of ischemic heart disease and other diseases of the circulatory system: Secondary | ICD-10-CM | POA: Diagnosis not present

## 2017-02-05 LAB — MAGNESIUM: Magnesium: 1.6 mg/dL — ABNORMAL LOW (ref 1.7–2.4)

## 2017-02-05 LAB — BASIC METABOLIC PANEL
ANION GAP: 9 (ref 5–15)
BUN: 34 mg/dL — ABNORMAL HIGH (ref 6–20)
CHLORIDE: 106 mmol/L (ref 101–111)
CO2: 24 mmol/L (ref 22–32)
Calcium: 8.9 mg/dL (ref 8.9–10.3)
Creatinine, Ser: 1.7 mg/dL — ABNORMAL HIGH (ref 0.44–1.00)
GFR calc Af Amer: 34 mL/min — ABNORMAL LOW (ref >60)
GFR, EST NON AFRICAN AMERICAN: 29 mL/min — AB (ref >60)
GLUCOSE: 210 mg/dL — AB (ref 65–99)
POTASSIUM: 4.8 mmol/L (ref 3.5–5.1)
Sodium: 139 mmol/L (ref 135–145)

## 2017-02-05 MED ORDER — LISINOPRIL 10 MG PO TABS: 10.0000 mg | ORAL_TABLET | Freq: Every day | ORAL | 6 refills | Status: DC

## 2017-02-05 MED ORDER — MAGNESIUM OXIDE 400 MG PO CAPS: 400.0000 mg | ORAL_CAPSULE | Freq: Every day | ORAL | 11 refills | Status: DC

## 2017-02-05 NOTE — Addendum Note (Signed)
Addended by: Aaren Atallah, Sharlot Gowda on: 02/05/2017 05:08 PM   Modules accepted: Orders

## 2017-02-05 NOTE — Telephone Encounter (Signed)
Patient aware. Patient voiced understanding.  Repeat labs 11/28

## 2017-02-05 NOTE — Telephone Encounter (Signed)
-----   Message from Shirley Friar, PA-C sent at 02/05/2017  2:43 PM EST ----- Please start on Mag ox 400 daily.   Please recheck BMET in 10-14 days as planned.  Do veltessa paperwork at that time in case K doesn't come down.     Avoid high potassium foods.  Legrand Como [address] [place], [place]

## 2017-02-05 NOTE — Patient Instructions (Signed)
INCREASE Lisinopril to 10 mg once daily. May "double up" on current 5 mg tablets you have at home (Take 2 tabs once daily). New Rx has been sent to your pharmacy for 10 mg tablets (Take 1 tab once daily).  Routine lab work today. Will notify you of abnormal results, otherwise no news is good news!  Will schedule you for an echocardiogram and lab work at [facility]. Address: [address] ([place]), [place], [place] [address]  Phone: [phone]  Follow up 2-3 months with Dr. Aundra Dubin.  Take all medication as prescribed the day of your appointment. Bring all medications with you to your appointment.  Do the following things EVERYDAY: 1) Weigh yourself in the morning before breakfast. Write it down and keep it in a log. 2) Take your medicines as prescribed 3) Eat low salt foods-Limit salt (sodium) to 2000 mg per day.  4) Stay as active as you can everyday 5) Limit all fluids for the day to less than 2 liters

## 2017-02-05 NOTE — Progress Notes (Signed)
Patient ID: [name], female   DOB: [birth year], 71 y.o.   MRN: [id number]     Advanced Heart Failure Clinic Note   PCP: Dr Jenny Reichmann Cardiology: Dr. Aundra Dubin EP: Dr Candie Echevaria  [name] is a 71 y.o. female  with history of CAD, ischemic cardiomyopathy, and atrial fibrillation presents for cardiology followup.  Patient was hospitalized in 11/12 at [facility] for VT with ICD discharge.  She had a left heart cath showing patent stents.  EF was 35-40% by echo.  She is on amiodarone.  She has had periodic problems with creatinine rising with medication adjustments.  Most recent echo in 6/16 showed EF 40-45%.   She returns today for follow up. Last seen in HF clinic 11/2015. Doing well overall. She states last week she had a "flutter" in her right chest that was short lived. Has not had any palpitations or chest pains associated with it. Mobility mostly limited due to arthritis. She denies SOB walking on flat ground or with ADLs. Taking all medications as directed. Recently moved back from [place] to [place].   Labs (10/12): LDL 73, HDL 44 Labs (11/12): K 3.9, creatinine 1.1, LFTs normal, TSH normal Labs (12/12): K 3.9, creatinine 1.5, proBNP 18 Labs (1/13): LDL 82, HDL 57 Labs (2/13): K 4.3, creatinine 1.5 Labs (5/13): creatinine 2.4 => 1.7, LFTs normal, TSH normal, proBNP 18 Labs (6/13): K 4.2, creatinine 1.7 Labs (10/13): K 4.3, creatinine 1.4 Labs (4/14); LFTs normal, TSH normal, LDL 71, HDL 58 Labs (5/14): K 4.5, creatinine 1.4 Labs (8/14): TSH normal, LFTs normal Labs (9/14): K 4.2, creatinine 1.8, LDL 75, HDL 54 Labs (07/31/13) K 3.7 Creatinine 1.8 BNP 148  Labs (9/15): K 4.3, creatinine 1.24, LFTs normal, LDL 81, HDL 58, TSH normal Labs (9/16): K 4.4, creatinine 1.44, LDL 64, HDL 54, LFTs normal, TSH normal, HCT 38 Labs (2/17): K 4.9, creatinine 1.76, HCT 38.6 Labs (07/2015): K 4.5 Creatinine 1.88    PMH: 1. Diabetes mellitus 2. CVA, TIA in 2010 3. HTN 4. CKD stage  3 5. H/o TAH 6. H/o CCY 7. Sciatica 8. Atrial fibrillation 9. CAD: s/p LAD and RCA PCI.  Last LHC in 11/12 with patent proximal LAD stent, ostial 70% D1 (jailed by stent), mild LAD stent patent, patent RCA stents, EF 40% with global hypokinesis.  10. Ischemic cardiomyopathy: Echo (10/12): EF 35-40%, moderate focal basal septal hypertrophy, inferior akinesis, grade I diastolic dysfunction, moderate aortic insufficiency. St Jude dual chamber ICD.  Spironolactone stopped when creatinine rose to 2.5.  Echo (5/14) with EF 40-45%, mild AI.  Echo (6/16) with EF 40-45%, inferior/inferoseptal hypokinesis, mild LVH.  11. Aortic insufficiency: moderate in past but mild on most recent echo.   12. 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  13. Gastroparesis  SH: Divorced.  3 children.  Quit smoking in 1997. Lives in [place] now with daughter.  [place].   FH: CAD  Review of systems complete and found to be negative unless listed in HPI.    Current Outpatient Medications  Medication Sig Dispense Refill  . amiodarone (PACERONE) 200 MG tablet TAKE 1 TABLET (200 MG TOTAL) BY MOUTH DAILY. 90 tablet 3  . Blood Glucose Monitoring Suppl (ACCU-CHEK NANO SMARTVIEW) W/DEVICE KIT Use to test blood sugar 4 times daily as instructed. Dx code: E11.59 1 kit 0  . carvedilol (COREG) 25 MG tablet Take 0.5 tablets (12.5 mg total) by mouth 2 (two) times daily with a meal. 45 tablet 3  . Cholecalciferol (VITAMIN  D3) 2000 UNITS TABS Take 2,000 Units by mouth daily.     [name] dicyclomine (BENTYL) 10 MG capsule Take 1 capsule (10 mg total) by mouth 4 (four) times daily -  before meals and at bedtime. 60 capsule 1  . furosemide (LASIX) 20 MG tablet Take 20 mg daily by mouth.    . gabapentin (NEURONTIN) 400 MG capsule TAKE 1 CAPSULE (400 MG TOTAL) BY MOUTH 2 (TWO) TIMES DAILY. 180 capsule 0  . glucose blood (ACCU-CHEK SMARTVIEW) test strip Use to test blood sugar 4 times daily as instructed. Dx code E11.59 200 each 11  . Insulin Detemir  (LEVEMIR FLEXTOUCH) 100 UNIT/ML Pen Inject under skin 25 units at bedtime (Patient taking differently: 28 Units. Inject under skin 25 units at bedtime) 45 mL 1  . lisinopril (PRINIVIL,ZESTRIL) 5 MG tablet Take 1 tablet (5 mg total) by mouth daily. 90 tablet 3  . metFORMIN (GLUCOPHAGE-XR) 500 MG 24 hr tablet Take 4 tablets (2,000 mg total) by mouth daily with breakfast. 360 tablet 3  . montelukast (SINGULAIR) 10 MG tablet TAKE 1 TABLET (10 MG TOTAL) BY MOUTH DAILY. 90 tablet 3  . Multiple Vitamins-Minerals (EYE VITAMINS PO) Take 1 tablet by mouth 2 (two) times daily.     . nitroGLYCERIN (NITROSTAT) 0.4 MG SL tablet Place 1 tablet (0.4 mg total) under the tongue every 5 (five) minutes as needed for chest pain. 25 tablet 1  . nizatidine (AXID) 150 MG capsule Take 1 capsule by mouth daily. Must keep 04/29/16 appt for future refills 30 capsule 0  . NOVOLOG 100 UNIT/ML injection INJECT 6-12 UNITS INTO THE SKIN 3 (THREE) TIMES DAILY BEFORE MEALS. (Patient taking differently: INJECT 28 UNITS INTO THE SKIN 3 (THREE) TIMES DAILY BEFORE MEALS.) 10 mL 2  . omeprazole (PRILOSEC) 20 MG capsule Take 1 capsule (20 mg total) by mouth daily. 90 capsule 3  . rosuvastatin (CRESTOR) 20 MG tablet Take 1 tablet (20 mg total) by mouth daily. 90 tablet 3  . traMADol (ULTRAM) 50 MG tablet Take 50 mg by mouth every 6 (six) hours as needed (pain).    . Vitamin D, Ergocalciferol, (DRISDOL) 50000 units CAPS capsule Take 1 capsule (50,000 Units total) by mouth once a week. 12 capsule 3  . warfarin (COUMADIN) 1 MG tablet TAKE AS DIRECTED BY COUMADIN CLINIC 40 tablet 3   No current facility-administered medications for this encounter.    Vitals:   02/05/17 1147  BP: (!) 148/70  Pulse: 77  SpO2: 97%  Weight: 196 lb (88.9 kg)   Wt Readings from Last 3 Encounters:  02/05/17 196 lb (88.9 kg)  01/05/17 199 lb (90.3 kg)  06/25/16 198 lb (89.8 kg)   General: Well appearing. No resp difficulty. HEENT: Normal Neck: Supple. JVP  5-6. Carotids 2+ bilat; no bruits. No thyromegaly or nodule noted. Cor: PMI nondisplaced. RRR, 2/6 early SEM.  Lungs: CTAB, normal effort. Abdomen: Soft, non-tender, non-distended, no HSM. No bruits or masses. +BS  Extremities: No cyanosis, clubbing, or rash. Trace ankle edema.  Neuro: Alert & orientedx3, cranial nerves grossly intact. moves all 4 extremities w/o difficulty. Affect pleasant   Assessment/Plan 1. Atrial fibrillation - Paroxysmal - NSR by exam - Continue amiodarone 200 mg daily. Recent LFTs stable. BMET and Mg today.  - Aware of need for yearly eye exams.  - Continue coumadin.  Denies bleeding.  2. Coronary artery disease   - No s/s of ischemia.    - No ASA with stable CAD on warfarin.  -  Recent Lipids stable. Continue statin 3. Ventricular tachycardia - On amiodarone with suppression of VT.  4. Ischemic cardiomyopathy - NYHA class II symptoms - Volume status stable on exam - Continue lasix 20 mg daily. Can take extra 20 mg as needed.  - Continue coreg 12.5 mg BID. Intolerant of higher dose of coreg due to BP and fatigue.  - Increase lisinopril to 10 mg daily. BMET today and 10 days.  - Intolerant spironolactone due to nausea.  5. CKD III:  - BMET today and repeat 2 weeks with med changes.  6 HTN - Elevated on arrival. Meds as above.   Shirley Friar, PA-C  02/05/2017   Greater than 50% of the 25 minute visit was spent in counseling/coordination of care regarding disease state education, salt/fluid restriction, sliding scale diuretics, and medication compliance.

## 2017-02-09 ENCOUNTER — Other Ambulatory Visit (HOSPITAL_COMMUNITY): Payer: Self-pay | Admitting: *Deleted

## 2017-02-17 ENCOUNTER — Ambulatory Visit (HOSPITAL_COMMUNITY)
Admission: RE | Admit: 2017-02-17 | Discharge: 2017-02-17 | Disposition: A | Discharge status: Home / Self Care | Payer: Medicare PPO | Source: Ambulatory Visit | Attending: Internal Medicine | Admitting: Internal Medicine

## 2017-02-17 DIAGNOSIS — I5022 Chronic systolic (congestive) heart failure: Secondary | ICD-10-CM

## 2017-02-17 LAB — BASIC METABOLIC PANEL
Anion gap: 9 (ref 5–15)
BUN: 49 mg/dL — AB (ref 6–20)
CHLORIDE: 108 mmol/L (ref 101–111)
CO2: 25 mmol/L (ref 22–32)
Calcium: 9 mg/dL (ref 8.9–10.3)
Creatinine, Ser: 2.06 mg/dL — ABNORMAL HIGH (ref 0.44–1.00)
GFR calc Af Amer: 27 mL/min — ABNORMAL LOW (ref >60)
GFR calc non Af Amer: 23 mL/min — ABNORMAL LOW (ref >60)
GLUCOSE: 259 mg/dL — AB (ref 65–99)
POTASSIUM: 4.8 mmol/L (ref 3.5–5.1)
SODIUM: 142 mmol/L (ref 135–145)

## 2017-02-19 ENCOUNTER — Telehealth (HOSPITAL_COMMUNITY): Payer: Self-pay | Admitting: *Deleted

## 2017-02-19 DIAGNOSIS — N289 Disorder of kidney and ureter, unspecified: Secondary | ICD-10-CM

## 2017-02-19 DIAGNOSIS — I5032 Chronic diastolic (congestive) heart failure: Secondary | ICD-10-CM

## 2017-02-19 MED ORDER — LISINOPRIL 10 MG PO TABS: 5.0000 mg | ORAL_TABLET | Freq: Every day | ORAL | 6 refills | Status: DC

## 2017-02-19 NOTE — Telephone Encounter (Signed)
Notes recorded by Scarlette Calico, RN on 02/19/2017 at 4:08 PM EST Pt aware, agreeable and verbalizes understanding, repeat labs 12/10, she also ask for repeat mag level at that time.

## 2017-02-19 NOTE — Telephone Encounter (Signed)
-----   Message from Shirley Friar, PA-C sent at 02/17/2017  1:23 PM EST ----- Decrease lisinopril back to 5 mg daily.   If weight down can hold lasix for 1-2 days, if normal just continue.   Needs repeat BMET one week.    Legrand Como [address]" [place], PA-C 02/17/2017 1:23 PM

## 2017-02-22 ENCOUNTER — Ambulatory Visit (HOSPITAL_COMMUNITY): Payer: Medicare PPO | Attending: Student

## 2017-02-22 ENCOUNTER — Other Ambulatory Visit: Payer: Self-pay

## 2017-02-22 DIAGNOSIS — I4891 Unspecified atrial fibrillation: Secondary | ICD-10-CM | POA: Diagnosis not present

## 2017-02-22 DIAGNOSIS — I110 Hypertensive heart disease with heart failure: Secondary | ICD-10-CM | POA: Insufficient documentation

## 2017-02-22 DIAGNOSIS — I080 Rheumatic disorders of both mitral and aortic valves: Secondary | ICD-10-CM | POA: Diagnosis not present

## 2017-02-22 DIAGNOSIS — Z6834 Body mass index (BMI) 34.0-34.9, adult: Secondary | ICD-10-CM | POA: Diagnosis not present

## 2017-02-22 DIAGNOSIS — Z95810 Presence of automatic (implantable) cardiac defibrillator: Secondary | ICD-10-CM | POA: Insufficient documentation

## 2017-02-22 DIAGNOSIS — I2510 Atherosclerotic heart disease of native coronary artery without angina pectoris: Secondary | ICD-10-CM | POA: Diagnosis not present

## 2017-02-22 DIAGNOSIS — I5022 Chronic systolic (congestive) heart failure: Secondary | ICD-10-CM | POA: Diagnosis not present

## 2017-02-22 DIAGNOSIS — I422 Other hypertrophic cardiomyopathy: Secondary | ICD-10-CM | POA: Diagnosis not present

## 2017-02-22 DIAGNOSIS — Z87891 Personal history of nicotine dependence: Secondary | ICD-10-CM | POA: Diagnosis not present

## 2017-02-22 DIAGNOSIS — E669 Obesity, unspecified: Secondary | ICD-10-CM | POA: Insufficient documentation

## 2017-02-22 LAB — ECHOCARDIOGRAM COMPLETE
AOASC: 33 cm
AVPHT: 504 ms
CHL CUP DOP CALC LVOT VTI: 25.8 cm
E/e' ratio: 13.78
EWDT: 141 ms
FS: 17 % — AB (ref 28–44)
IV/PV OW: 1.16
LA ID, A-P, ES: 32 mm
LA vol A4C: 52.3 ml
LA vol index: 27.2 mL/m2
LADIAMINDEX: 1.67 cm/m2
LAVOL: 52.2 mL
LDCA: 4.15 cm2
LEFT ATRIUM END SYS DIAM: 32 mm
LV E/e' medial: 13.78
LV e' LATERAL: 3.6 cm/s
LVEEAVG: 13.78
LVOT diameter: 23 mm
LVOT peak vel: 98.3 cm/s
LVOTSV: 107 mL
Lateral S' vel: 8.1 cm/s
MV Dec: 141
MV pk E vel: 49.6 m/s
MVPKAVEL: 101 m/s
PW: 13 mm — AB (ref 0.6–1.1)
TAPSE: 21.9 mm
TDI e' lateral: 3.6
TDI e' medial: 2.7

## 2017-02-25 ENCOUNTER — Telehealth: Payer: Self-pay | Admitting: Internal Medicine

## 2017-02-25 MED ORDER — GABAPENTIN 400 MG PO CAPS: ORAL_CAPSULE | ORAL | 0 refills | Status: DC

## 2017-02-25 NOTE — Telephone Encounter (Signed)
Per chart medciation was rx by Dr. Renne Crigler for peripheral neuropathy. Pls advise if ok to refill in [place] abscence...[name]

## 2017-02-25 NOTE — Telephone Encounter (Signed)
Copied from [place] [id number]. Topic: Quick Communication - Rx Refill/Question >> Feb 25, 2017  1:01 PM Cecelia Byars, NT wrote: Has the patient contacted their pharmacy? No  Agent: If no, request that the patient contact the pharmacy for the refill. [facility]erred Pharm[facility] (with phone number [facility] [place] Agent: Please be advised that RX refills may take up to 3 business days. We ask that you follow-up with your pharmacy. Need script for gabapentin  400 mg please call patient at [phone]

## 2017-02-25 NOTE — Telephone Encounter (Signed)
Ok to fill 

## 2017-02-25 NOTE — Telephone Encounter (Signed)
Called pt no answer LMOM [facility].../lmb

## 2017-03-01 ENCOUNTER — Other Ambulatory Visit (HOSPITAL_COMMUNITY): Payer: Medicare PPO

## 2017-03-03 ENCOUNTER — Other Ambulatory Visit (HOSPITAL_COMMUNITY): Payer: Medicare PPO

## 2017-03-03 ENCOUNTER — Ambulatory Visit: Payer: Medicare PPO | Admitting: Internal Medicine

## 2017-03-05 ENCOUNTER — Ambulatory Visit: Payer: Medicare PPO

## 2017-03-09 ENCOUNTER — Ambulatory Visit (INDEPENDENT_AMBULATORY_CARE_PROVIDER_SITE_OTHER): Payer: Medicare PPO | Admitting: General Practice

## 2017-03-09 ENCOUNTER — Telehealth: Payer: Self-pay | Admitting: Internal Medicine

## 2017-03-09 DIAGNOSIS — Z7901 Long term (current) use of anticoagulants: Secondary | ICD-10-CM | POA: Diagnosis not present

## 2017-03-09 DIAGNOSIS — I4891 Unspecified atrial fibrillation: Secondary | ICD-10-CM

## 2017-03-09 LAB — POCT INR: INR: 3.6

## 2017-03-09 MED ORDER — INSULIN ASPART 100 UNIT/ML ~~LOC~~ SOLN: SUBCUTANEOUS | 2 refills | Status: DC

## 2017-03-09 NOTE — Telephone Encounter (Signed)
Error

## 2017-03-09 NOTE — Telephone Encounter (Signed)
[facility] [place]

## 2017-03-09 NOTE — Telephone Encounter (Signed)
Patient was seeing an MD in [place] for blood sugar control.  States has not see this doctor in awhile.  She is not sure what kind of MD this was.  States she was seeing Dr.Gherghe when she lived her previously.  I tried to work patient in for Thursday morning and patient did not have a way.  States she is almost out of novalog and has some questions on what she can do in the mean time to get her through until appt scheduled for next week with Dr. Jenny Reichmann.  I have also advised patient to contact previous doctor who prescribed this as well.

## 2017-03-09 NOTE — Patient Instructions (Addendum)
Pre visit review using our clinic review tool, if applicable. No additional management support is needed unless otherwise documented below in the visit note.  Skip coumadin today (12/18) and then resume taking 1 tablet daily except 1 1/2 tablets on Wednesday.  Re-check in 3 weeks.

## 2017-03-11 ENCOUNTER — Telehealth (HOSPITAL_COMMUNITY): Payer: Self-pay | Admitting: Cardiology

## 2017-03-11 ENCOUNTER — Ambulatory Visit (HOSPITAL_COMMUNITY)
Admission: RE | Admit: 2017-03-11 | Discharge: 2017-03-11 | Disposition: A | Discharge status: Home / Self Care | Payer: Medicare PPO | Source: Ambulatory Visit | Attending: Cardiology | Admitting: Cardiology

## 2017-03-11 ENCOUNTER — Telehealth (HOSPITAL_COMMUNITY): Payer: Self-pay | Admitting: Pharmacist

## 2017-03-11 DIAGNOSIS — N289 Disorder of kidney and ureter, unspecified: Secondary | ICD-10-CM

## 2017-03-11 DIAGNOSIS — I5022 Chronic systolic (congestive) heart failure: Secondary | ICD-10-CM

## 2017-03-11 DIAGNOSIS — I5032 Chronic diastolic (congestive) heart failure: Secondary | ICD-10-CM

## 2017-03-11 LAB — BASIC METABOLIC PANEL
Anion gap: 12 (ref 5–15)
BUN: 34 mg/dL — AB (ref 6–20)
CALCIUM: 8.7 mg/dL — AB (ref 8.9–10.3)
CO2: 20 mmol/L — ABNORMAL LOW (ref 22–32)
CREATININE: 1.88 mg/dL — AB (ref 0.44–1.00)
Chloride: 107 mmol/L (ref 101–111)
GFR calc Af Amer: 30 mL/min — ABNORMAL LOW (ref >60)
GFR, EST NON AFRICAN AMERICAN: 26 mL/min — AB (ref >60)
Glucose, Bld: 276 mg/dL — ABNORMAL HIGH (ref 65–99)
Potassium: 4.9 mmol/L (ref 3.5–5.1)
SODIUM: 139 mmol/L (ref 135–145)

## 2017-03-11 LAB — MAGNESIUM: MAGNESIUM: 1.7 mg/dL (ref 1.7–2.4)

## 2017-03-11 MED ORDER — MAGNESIUM OXIDE 400 MG PO CAPS: 200.0000 mg | ORAL_CAPSULE | Freq: Two times a day (BID) | ORAL | 11 refills | Status: DC

## 2017-03-11 NOTE — Telephone Encounter (Signed)
Pt aware voiced understanding repeat labs 1/10

## 2017-03-11 NOTE — Telephone Encounter (Signed)
[name] asked to speak with me during her lab visit wanting to know if she could take something other than the magnesium oxide 400 mg daily she has been taking. Since starting the magnesium, she has had intolerable diarrhea, nausea/vomiting and symptoms of a yeast infection. Upon further questioning, she describes UTI type symptoms with frequency, urgency and dysuria. She has been taking Monistat OTC that has not resolved her symptoms. I have advised her to go see her PCP or go to Urgent Care for evaluation of possible UTI. If Magnesium still low, will advise her to take magnesium oxide 200 mg BID per discussion with Dr. Aundra Dubin.   Ruta Hinds. Velva Harman, PharmD, BCPS, CPP Clinical Pharmacist Pager: [phone] Phone: [phone] 03/11/2017 2:37 PM

## 2017-03-11 NOTE — Telephone Encounter (Signed)
-----   Message from Shirley Friar, PA-C sent at 03/11/2017  3:01 PM EST ----- Creatinine improved. Please recheck 2-3 weeks.    Increase Mag Ox to 400 BID    Beryle Beams" [place], [place] 03/11/2017 3:00 PM

## 2017-03-12 DIAGNOSIS — J0100 Acute maxillary sinusitis, unspecified: Secondary | ICD-10-CM | POA: Diagnosis not present

## 2017-03-12 DIAGNOSIS — N3090 Cystitis, unspecified without hematuria: Secondary | ICD-10-CM | POA: Diagnosis not present

## 2017-03-12 DIAGNOSIS — R300 Dysuria: Secondary | ICD-10-CM | POA: Diagnosis not present

## 2017-03-18 ENCOUNTER — Ambulatory Visit: Payer: Medicare PPO | Admitting: Internal Medicine

## 2017-03-24 ENCOUNTER — Other Ambulatory Visit: Payer: Self-pay | Admitting: Internal Medicine

## 2017-03-30 ENCOUNTER — Ambulatory Visit: Payer: Medicare PPO | Admitting: General Practice

## 2017-03-30 DIAGNOSIS — Z7901 Long term (current) use of anticoagulants: Secondary | ICD-10-CM

## 2017-03-30 DIAGNOSIS — I4891 Unspecified atrial fibrillation: Secondary | ICD-10-CM | POA: Diagnosis not present

## 2017-03-30 LAB — POCT INR: INR: 3.7

## 2017-03-30 NOTE — Patient Instructions (Addendum)
Pre visit review using our clinic review tool, if applicable. No additional management support is needed unless otherwise documented below in the visit note.  Skip coumadin today (1/8) and then take 1 tablet daily.  Re-check in 4 weeks.

## 2017-04-01 ENCOUNTER — Ambulatory Visit (HOSPITAL_COMMUNITY)
Admission: RE | Admit: 2017-04-01 | Discharge: 2017-04-01 | Disposition: A | Discharge status: Home / Self Care | Payer: Medicare PPO | Source: Ambulatory Visit | Attending: Internal Medicine | Admitting: Internal Medicine

## 2017-04-01 ENCOUNTER — Telehealth (HOSPITAL_COMMUNITY): Payer: Self-pay | Admitting: *Deleted

## 2017-04-01 DIAGNOSIS — I5022 Chronic systolic (congestive) heart failure: Secondary | ICD-10-CM

## 2017-04-01 NOTE — Telephone Encounter (Signed)
Lab called stating patient's bmet and mag were hemolyzed.  I tried calling patient to see if she can come back to let us draw them again but had to leave VM.

## 2017-04-01 NOTE — Telephone Encounter (Signed)
Patient called back and she is able to come back tomorrow for labs.  Bmet and Mag ordered and appointment scheduled.

## 2017-04-02 ENCOUNTER — Telehealth: Payer: Self-pay | Admitting: Internal Medicine

## 2017-04-02 ENCOUNTER — Other Ambulatory Visit (HOSPITAL_COMMUNITY): Payer: Medicare PPO

## 2017-04-02 DIAGNOSIS — E559 Vitamin D deficiency, unspecified: Secondary | ICD-10-CM

## 2017-04-02 NOTE — Telephone Encounter (Signed)
Pt. requesting refill on Vitamin D, 50,000 unit capsule.  Will send to the office for review.

## 2017-04-02 NOTE — Telephone Encounter (Signed)
Copied from [place] [id number]. Topic: Quick Communication - Rx Refill/Question >> Apr 02, 2017 11:36 AM Synthia Innocent wrote: Medication: Vitamin D 50,000   Has the patient contacted their pharmacy? Yes, new prescription   (Agent: If no, request that the patient contact the pharmacy for the refill.)   Preferred Pharmacy (with phone number or street name): [facility]   Agent: Please be advised that RX refills may take up to 3 business days. We ask that you follow-up with your pharmacy.

## 2017-04-05 NOTE — Telephone Encounter (Signed)
We would not be able for this refill without documented low Vit D level.  I have added the Vit D test to next lab orders  In the meantime, please take Vit D3 OTC 2000 units per day

## 2017-04-05 NOTE — Telephone Encounter (Signed)
Pt has been informed and expressed understanding.  

## 2017-04-08 ENCOUNTER — Other Ambulatory Visit: Payer: Self-pay | Admitting: Internal Medicine

## 2017-04-08 ENCOUNTER — Ambulatory Visit (HOSPITAL_COMMUNITY)
Admission: RE | Admit: 2017-04-08 | Discharge: 2017-04-08 | Disposition: A | Discharge status: Home / Self Care | Payer: Medicare PPO | Source: Ambulatory Visit | Attending: Cardiology | Admitting: Cardiology

## 2017-04-08 ENCOUNTER — Other Ambulatory Visit (INDEPENDENT_AMBULATORY_CARE_PROVIDER_SITE_OTHER): Payer: Medicare PPO

## 2017-04-08 ENCOUNTER — Telehealth: Payer: Self-pay

## 2017-04-08 DIAGNOSIS — E039 Hypothyroidism, unspecified: Secondary | ICD-10-CM

## 2017-04-08 DIAGNOSIS — E1165 Type 2 diabetes mellitus with hyperglycemia: Secondary | ICD-10-CM | POA: Diagnosis not present

## 2017-04-08 DIAGNOSIS — E559 Vitamin D deficiency, unspecified: Secondary | ICD-10-CM

## 2017-04-08 DIAGNOSIS — I5022 Chronic systolic (congestive) heart failure: Secondary | ICD-10-CM

## 2017-04-08 DIAGNOSIS — E1151 Type 2 diabetes mellitus with diabetic peripheral angiopathy without gangrene: Secondary | ICD-10-CM

## 2017-04-08 DIAGNOSIS — IMO0002 Reserved for concepts with insufficient information to code with codable children: Secondary | ICD-10-CM

## 2017-04-08 LAB — MICROALBUMIN / CREATININE URINE RATIO
Creatinine,U: 67.8 mg/dL
Microalb Creat Ratio: 9.4 mg/g (ref 0.0–30.0)
Microalb, Ur: 6.4 mg/dL — ABNORMAL HIGH (ref 0.0–1.9)

## 2017-04-08 LAB — BASIC METABOLIC PANEL
ANION GAP: 12 (ref 5–15)
BUN: 18 mg/dL (ref 6–20)
BUN: 20 mg/dL (ref 6–23)
CALCIUM: 9.1 mg/dL (ref 8.9–10.3)
CO2: 22 mmol/L (ref 22–32)
CO2: 28 mEq/L (ref 19–32)
Calcium: 9.2 mg/dL (ref 8.4–10.5)
Chloride: 108 mmol/L (ref 101–111)
Chloride: 104 mEq/L (ref 96–112)
Creatinine, Ser: 1.32 mg/dL — ABNORMAL HIGH (ref 0.44–1.00)
Creatinine, Ser: 1.26 mg/dL — ABNORMAL HIGH (ref 0.40–1.20)
GFR, EST AFRICAN AMERICAN: 46 mL/min — AB (ref >60)
GFR, EST NON AFRICAN AMERICAN: 40 mL/min — AB (ref >60)
GFR: 44.6 mL/min — AB (ref >60.00)
Glucose, Bld: 181 mg/dL — ABNORMAL HIGH (ref 65–99)
Glucose, Bld: 189 mg/dL — ABNORMAL HIGH (ref 70–99)
Potassium: 5.1 mmol/L (ref 3.5–5.1)
Potassium: 4.8 mEq/L (ref 3.5–5.1)
SODIUM: 142 mmol/L (ref 135–145)
SODIUM: 141 meq/L (ref 135–145)

## 2017-04-08 LAB — CBC WITH DIFFERENTIAL/PLATELET
BASOS PCT: 0.7 % (ref 0.0–3.0)
Basophils Absolute: 0.1 10*3/uL (ref 0.0–0.1)
EOS PCT: 2.2 % (ref 0.0–5.0)
Eosinophils Absolute: 0.2 10*3/uL (ref 0.0–0.7)
HEMATOCRIT: 39.7 % (ref 36.0–46.0)
HEMOGLOBIN: 12.9 g/dL (ref 12.0–15.0)
LYMPHS PCT: 16.5 % (ref 12.0–46.0)
Lymphs Abs: 1.1 10*3/uL (ref 0.7–4.0)
MCHC: 32.5 g/dL (ref 30.0–36.0)
MCV: 94.7 fl (ref 78.0–100.0)
Monocytes Absolute: 0.5 10*3/uL (ref 0.1–1.0)
Monocytes Relative: 7.8 % (ref 3.0–12.0)
Neutro Abs: 5.1 10*3/uL (ref 1.4–7.7)
Neutrophils Relative %: 72.8 % (ref 43.0–77.0)
Platelets: 221 10*3/uL (ref 150.0–400.0)
RBC: 4.19 Mil/uL (ref 3.87–5.11)
RDW: 14.3 % (ref 11.5–15.5)
WBC: 7 10*3/uL (ref 4.0–10.5)

## 2017-04-08 LAB — URINALYSIS, ROUTINE W REFLEX MICROSCOPIC
Bacteria, UA: NONE SEEN
Bilirubin Urine: NEGATIVE
Hgb urine dipstick: NEGATIVE
KETONES UR: NEGATIVE
Nitrite: NEGATIVE
PH: 8 (ref 5.0–8.0)
SPECIFIC GRAVITY, URINE: 1.015 (ref 1.000–1.030)
UROBILINOGEN UA: 0.2 (ref 0.0–1.0)
Urine Glucose: NEGATIVE

## 2017-04-08 LAB — VITAMIN D 25 HYDROXY (VIT D DEFICIENCY, FRACTURES): VITD: 60.45 ng/mL (ref 30.00–100.00)

## 2017-04-08 LAB — HEPATIC FUNCTION PANEL
ALBUMIN: 3.9 g/dL (ref 3.5–5.2)
ALT: 12 U/L (ref 0–35)
AST: 14 U/L (ref 0–37)
Alkaline Phosphatase: 83 U/L (ref 39–117)
Bilirubin, Direct: 0.1 mg/dL (ref 0.0–0.3)
TOTAL PROTEIN: 6.9 g/dL (ref 6.0–8.3)
Total Bilirubin: 0.7 mg/dL (ref 0.2–1.2)

## 2017-04-08 LAB — HEMOGLOBIN A1C: Hgb A1c MFr Bld: 8.1 % — ABNORMAL HIGH (ref 4.6–6.5)

## 2017-04-08 LAB — LIPID PANEL
CHOLESTEROL: 151 mg/dL (ref 0–200)
HDL: 58.3 mg/dL (ref >39.00)
LDL Cholesterol: 71 mg/dL (ref 0–99)
NonHDL: 93.14
Total CHOL/HDL Ratio: 3
Triglycerides: 112 mg/dL (ref 0.0–149.0)
VLDL: 22.4 mg/dL (ref 0.0–40.0)

## 2017-04-08 LAB — MAGNESIUM: MAGNESIUM: 1.9 mg/dL (ref 1.7–2.4)

## 2017-04-08 LAB — TSH: TSH: 6.03 u[IU]/mL — ABNORMAL HIGH (ref 0.35–4.50)

## 2017-04-08 MED ORDER — LEVOTHYROXINE SODIUM 25 MCG PO TABS: 25.0000 ug | ORAL_TABLET | Freq: Every day | ORAL | 3 refills | Status: DC

## 2017-04-08 NOTE — Telephone Encounter (Signed)
Pt has been informed and expressed understanding.  

## 2017-04-08 NOTE — Telephone Encounter (Signed)
-----   Message from Biagio Borg, MD sent at 04/08/2017  3:23 PM EST ----- Left message on MyChart, pt to cont same tx except  The test results show that your current treatment is OK, except the thyroid test (TSH) is slightly off, meaning that you have mild new low thyroid condition.  This could be related to a side effect of the amiodarone.  We will need to add a very small dose of thyroid medication, so I will send the prescriptio, and please come back to the lab in 4 wks for repeat thyroid testing.  [name] to please inform pt, I will do rx and order

## 2017-04-19 ENCOUNTER — Ambulatory Visit (INDEPENDENT_AMBULATORY_CARE_PROVIDER_SITE_OTHER): Payer: Medicare PPO | Admitting: *Deleted

## 2017-04-19 DIAGNOSIS — I255 Ischemic cardiomyopathy: Secondary | ICD-10-CM

## 2017-04-19 NOTE — Progress Notes (Signed)
Remote ICD transmission.   

## 2017-04-21 ENCOUNTER — Encounter: Payer: Self-pay | Admitting: Cardiology

## 2017-04-22 LAB — CUP PACEART REMOTE DEVICE CHECK
Brady Statistic AP VP Percent: 2.5 %
Brady Statistic AS VP Percent: 1 %
Brady Statistic AS VS Percent: 1.3 %
Brady Statistic RV Percent Paced: 2.7 %
Date Time Interrogation Session: 20190128105940
HIGH POWER IMPEDANCE MEASURED VALUE: 48 Ohm
Implantable Lead Implant Date: 20070117
Implantable Lead Implant Date: 20070117
Implantable Lead Location: 753859
Implantable Lead Model: 7040
Implantable Pulse Generator Implant Date: 20130212
Lead Channel Impedance Value: 480 Ohm
Lead Channel Impedance Value: 540 Ohm
Lead Channel Pacing Threshold Amplitude: 1 V
Lead Channel Pacing Threshold Pulse Width: 0.5 ms
Lead Channel Sensing Intrinsic Amplitude: 11.8 mV
Lead Channel Sensing Intrinsic Amplitude: 3.1 mV
Lead Channel Setting Pacing Amplitude: 2 V
Lead Channel Setting Pacing Amplitude: 2.5 V
Lead Channel Setting Pacing Pulse Width: 0.5 ms
MDC IDC LEAD LOCATION: 753860
MDC IDC MSMT BATTERY REMAINING LONGEVITY: 32 mo
MDC IDC MSMT BATTERY REMAINING PERCENTAGE: 33 %
MDC IDC MSMT BATTERY VOLTAGE: 2.87 V
MDC IDC MSMT LEADCHNL RA PACING THRESHOLD AMPLITUDE: 0.5 V
MDC IDC MSMT LEADCHNL RA PACING THRESHOLD PULSEWIDTH: 0.5 ms
MDC IDC PG SERIAL: 819806
MDC IDC SET LEADCHNL RV SENSING SENSITIVITY: 0.5 mV
MDC IDC STAT BRADY AP VS PERCENT: 96 %
MDC IDC STAT BRADY RA PERCENT PACED: 97 %

## 2017-04-27 ENCOUNTER — Ambulatory Visit (HOSPITAL_COMMUNITY)
Admission: RE | Admit: 2017-04-27 | Discharge: 2017-04-27 | Disposition: A | Discharge status: Home / Self Care | Payer: Medicare PPO | Source: Ambulatory Visit | Attending: Cardiology | Admitting: Cardiology

## 2017-04-27 ENCOUNTER — Encounter (HOSPITAL_COMMUNITY): Payer: Self-pay | Admitting: Cardiology

## 2017-04-27 ENCOUNTER — Other Ambulatory Visit: Payer: Self-pay

## 2017-04-27 ENCOUNTER — Telehealth (HOSPITAL_COMMUNITY): Payer: Self-pay | Admitting: Pharmacist

## 2017-04-27 VITALS — BP 140/68 | HR 67 | Wt 191.8 lb

## 2017-04-27 DIAGNOSIS — Z79890 Hormone replacement therapy (postmenopausal): Secondary | ICD-10-CM | POA: Insufficient documentation

## 2017-04-27 DIAGNOSIS — I4729 Other ventricular tachycardia: Secondary | ICD-10-CM

## 2017-04-27 DIAGNOSIS — E1122 Type 2 diabetes mellitus with diabetic chronic kidney disease: Secondary | ICD-10-CM | POA: Insufficient documentation

## 2017-04-27 DIAGNOSIS — Z87891 Personal history of nicotine dependence: Secondary | ICD-10-CM | POA: Insufficient documentation

## 2017-04-27 DIAGNOSIS — I255 Ischemic cardiomyopathy: Secondary | ICD-10-CM | POA: Insufficient documentation

## 2017-04-27 DIAGNOSIS — I130 Hypertensive heart and chronic kidney disease with heart failure and stage 1 through stage 4 chronic kidney disease, or unspecified chronic kidney disease: Secondary | ICD-10-CM | POA: Diagnosis not present

## 2017-04-27 DIAGNOSIS — Z8673 Personal history of transient ischemic attack (TIA), and cerebral infarction without residual deficits: Secondary | ICD-10-CM | POA: Diagnosis not present

## 2017-04-27 DIAGNOSIS — I5022 Chronic systolic (congestive) heart failure: Secondary | ICD-10-CM | POA: Diagnosis not present

## 2017-04-27 DIAGNOSIS — Z79899 Other long term (current) drug therapy: Secondary | ICD-10-CM | POA: Insufficient documentation

## 2017-04-27 DIAGNOSIS — Z8249 Family history of ischemic heart disease and other diseases of the circulatory system: Secondary | ICD-10-CM | POA: Insufficient documentation

## 2017-04-27 DIAGNOSIS — N183 Chronic kidney disease, stage 3 (moderate): Secondary | ICD-10-CM | POA: Diagnosis not present

## 2017-04-27 DIAGNOSIS — Z794 Long term (current) use of insulin: Secondary | ICD-10-CM | POA: Diagnosis not present

## 2017-04-27 DIAGNOSIS — I2510 Atherosclerotic heart disease of native coronary artery without angina pectoris: Secondary | ICD-10-CM | POA: Diagnosis not present

## 2017-04-27 DIAGNOSIS — I472 Ventricular tachycardia: Secondary | ICD-10-CM | POA: Insufficient documentation

## 2017-04-27 DIAGNOSIS — Z7901 Long term (current) use of anticoagulants: Secondary | ICD-10-CM | POA: Insufficient documentation

## 2017-04-27 DIAGNOSIS — I480 Paroxysmal atrial fibrillation: Secondary | ICD-10-CM | POA: Diagnosis not present

## 2017-04-27 LAB — PROTIME-INR
INR: 1.61
PROTHROMBIN TIME: 19 s — AB (ref 11.4–15.2)

## 2017-04-27 LAB — MAGNESIUM: MAGNESIUM: 1.7 mg/dL (ref 1.7–2.4)

## 2017-04-27 MED ORDER — CARVEDILOL 25 MG PO TABS: 12.5000 mg | ORAL_TABLET | Freq: Two times a day (BID) | ORAL | 3 refills | Status: DC

## 2017-04-27 MED ORDER — APIXABAN 5 MG PO TABS: 5.0000 mg | ORAL_TABLET | Freq: Two times a day (BID) | ORAL | 11 refills | Status: DC

## 2017-04-27 MED ORDER — ROSUVASTATIN CALCIUM 20 MG PO TABS: 20.0000 mg | ORAL_TABLET | Freq: Every day | ORAL | 3 refills | Status: DC

## 2017-04-27 MED ORDER — TRAMADOL HCL 50 MG PO TABS: 50.0000 mg | ORAL_TABLET | Freq: Four times a day (QID) | ORAL | 0 refills | Status: DC | PRN

## 2017-04-27 MED ORDER — SACUBITRIL-VALSARTAN 24-26 MG PO TABS: 1.0000 | ORAL_TABLET | Freq: Two times a day (BID) | ORAL | 3 refills | Status: DC

## 2017-04-27 MED ORDER — WARFARIN SODIUM 1 MG PO TABS: ORAL_TABLET | ORAL | 3 refills | Status: DC

## 2017-04-27 NOTE — Patient Instructions (Signed)
Stop Lisinopril   Stop Furosemide  Start Entresto 24/26 (1 tab), twice a day  Labs drawn today (if we do not call you, then your lab work was stable)   Your physician recommends that you return for lab work in: 10 days   Your physician recommends that you schedule a follow-up appointment in: 2 months with Dr. Aundra Dubin

## 2017-04-27 NOTE — Telephone Encounter (Signed)
Since INR <2, may stop warfarin and start Eliquis 5 mg BID tonight. Reviewed instructions, safety concerns and side effects with Ms. Nolet who verified understanding.   Ruta Hinds. Velva Harman, PharmD, BCPS, CPP Clinical Pharmacist Phone: [phone] 04/27/2017 2:01 PM

## 2017-04-28 ENCOUNTER — Telehealth (HOSPITAL_COMMUNITY): Payer: Self-pay | Admitting: Pharmacist

## 2017-04-28 NOTE — Telephone Encounter (Signed)
Entresto PA approved by [facility] Part D through 04/29/19.   Ruta Hinds. Velva Harman, PharmD, BCPS, CPP Clinical Pharmacist Phone: [phone] 04/28/2017 12:26 PM

## 2017-04-29 ENCOUNTER — Telehealth (HOSPITAL_COMMUNITY): Payer: Self-pay

## 2017-04-29 ENCOUNTER — Other Ambulatory Visit: Payer: Self-pay | Admitting: Internal Medicine

## 2017-04-29 ENCOUNTER — Other Ambulatory Visit (HOSPITAL_COMMUNITY): Payer: Self-pay | Admitting: Cardiology

## 2017-04-29 MED ORDER — MAGNESIUM OXIDE 400 MG PO CAPS: 400.0000 mg | ORAL_CAPSULE | Freq: Two times a day (BID) | ORAL | 11 refills | Status: DC

## 2017-04-29 NOTE — Telephone Encounter (Signed)
Notes recorded by Shirley Muscat, RN on 04/29/2017 at 2:45 PM EST Pt aware of results and agreeable to med changes (changes made in [facility])  ------  Notes recorded by Larey Dresser, MD on 04/29/2017 at 1:53 PM EST Increase to 400 bid ------  Notes recorded by Shirley Muscat, RN on 04/29/2017 at 10:40 AM EST Has Mag on med list and reports taking the 400 mg (take 200 mg BID). ------  Notes recorded by Harvie Junior, CMA on 04/28/2017 at 10:30 AM EST Left VM for pt to call for lab results. ------  Notes recorded by Larey Dresser, MD on 04/28/2017 at 9:41 AM EST Can add magnesium oxide 400 mg daily.

## 2017-04-29 NOTE — Progress Notes (Signed)
Patient ID: [name], female   DOB: 09-Apr-1945, 72 y.o.   MRN: [id number] PCP: Dr Jenny Reichmann Cardiology: Dr. Aundra Dubin  72 y.o. with history of CAD, ischemic cardiomyopathy, and atrial fibrillation presents for cardiology followup.  Patient was hospitalized in 11/12 at [facility] for VT with ICD discharge.  She had a left heart cath showing patent stents.  EF was 35-40% by echo.  She is on amiodarone.  She has had periodic problems with creatinine rising with medication adjustments.  Most recent echo in 12/18 showed EF 35-40%, inferior and septal hypokinesis, mild MR.    She returns today for followup of CHF and CAD.  Her main complaint is knee OA with significant pain.  This limits her activity.  She is short of breath walking up a hill but does ok on flat ground.  No chest pain. Rare palpitations.  No orthopnea/PND.  Weight is down 5 lbs.   ECG: a-paced, LVH (personally reviewed)  Labs (10/12): LDL 73, HDL 44 Labs (11/12): K 3.9, creatinine 1.1, LFTs normal, TSH normal Labs (12/12): K 3.9, creatinine 1.5, proBNP 18 Labs (1/13): LDL 82, HDL 57 Labs (2/13): K 4.3, creatinine 1.5 Labs (5/13): creatinine 2.4 => 1.7, LFTs normal, TSH normal, proBNP 18 Labs (6/13): K 4.2, creatinine 1.7 Labs (10/13): K 4.3, creatinine 1.4 Labs (4/14); LFTs normal, TSH normal, LDL 71, HDL 58 Labs (5/14): K 4.5, creatinine 1.4 Labs (8/14): TSH normal, LFTs normal Labs (9/14): K 4.2, creatinine 1.8, LDL 75, HDL 54 Labs (07/31/13) K 3.7 Creatinine 1.8 BNP 148  Labs (9/15): K 4.3, creatinine 1.24, LFTs normal, LDL 81, HDL 58, TSH normal Labs (9/16): K 4.4, creatinine 1.44, LDL 64, HDL 54, LFTs normal, TSH normal, HCT 38 Labs (2/17): K 4.9, creatinine 1.76, HCT 38.6 Labs (1/19): LDL 71, HDL 58, K 4.8, creatinine 1.26, LFTs normal, hgb 12.9.   PMH: 1. Diabetes mellitus 2. CVA, TIA in 2010 3. HTN 4. CKD stage 3 5. H/o TAH 6. H/o CCY 7. Sciatica 8. Atrial fibrillation 9. CAD: s/p LAD and RCA PCI.  Last LHC in  11/12 with patent proximal LAD stent, ostial 70% D1 (jailed by stent), mild LAD stent patent, patent RCA stents, EF 40% with global hypokinesis.  10. Ischemic cardiomyopathy: Echo (10/12): EF 35-40%, moderate focal basal septal hypertrophy, inferior akinesis, grade I diastolic dysfunction, moderate aortic insufficiency. St Jude dual chamber ICD.  Spironolactone stopped when creatinine rose to 2.5.  Echo (5/14) with EF 40-45%, mild AI.  Echo (6/16) with EF 40-45%, inferior/inferoseptal hypokinesis, mild LVH.  - Echo (12/18): EF 35-40%, inferior and septal hypokinesis, mild MR, mild AI.  11. Aortic insufficiency: moderate in past but mild on most recent echo.   12. 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  13. Gastroparesis  SH: Divorced.  3 children.  Quit smoking in 1997. Lives in [place] now with daughter.  [place].   FH: CAD  ROS: All systems reviewed and negative except as per HPI.   Current Outpatient Medications  Medication Sig Dispense Refill  . amiodarone (PACERONE) 200 MG tablet TAKE 1 TABLET (200 MG TOTAL) BY MOUTH DAILY. 90 tablet 3  . Blood Glucose Monitoring Suppl (ACCU-CHEK NANO SMARTVIEW) W/DEVICE KIT Use to test blood sugar 4 times daily as instructed. Dx code: E11.59 1 kit 0  . carvedilol (COREG) 25 MG tablet Take 0.5 tablets (12.5 mg total) by mouth 2 (two) times daily with a meal. 45 tablet 3  . Cholecalciferol (VITAMIN D3) 2000 UNITS TABS Take 2,000  Units by mouth daily.     [name] dicyclomine (BENTYL) 10 MG capsule Take 1 capsule (10 mg total) by mouth 4 (four) times daily -  before meals and at bedtime. 60 capsule 1  . gabapentin (NEURONTIN) 400 MG capsule TAKE 1 CAPSULE (400 MG TOTAL) BY MOUTH 2 (TWO) TIMES DAILY. 180 capsule 0  . glucose blood (ACCU-CHEK SMARTVIEW) test strip Use to test blood sugar 4 times daily as instructed. Dx code E11.59 200 each 11  . insulin aspart (NOVOLOG) 100 UNIT/ML injection INJECT 28 UNITS INTO THE SKIN 3 (THREE) TIMES DAILY BEFORE MEALS. 10 mL 2  .  Insulin Detemir (LEVEMIR FLEXTOUCH) 100 UNIT/ML Pen Inject under skin 25 units at bedtime (Patient taking differently: 28 Units. Inject under skin 25 units at bedtime) 45 mL 1  . levothyroxine (SYNTHROID, LEVOTHROID) 25 MCG tablet Take 1 tablet (25 mcg total) by mouth daily before breakfast. 90 tablet 3  . metFORMIN (GLUCOPHAGE-XR) 500 MG 24 hr tablet Take 4 tablets (2,000 mg total) by mouth daily with breakfast. 360 tablet 3  . montelukast (SINGULAIR) 10 MG tablet TAKE 1 TABLET (10 MG TOTAL) BY MOUTH DAILY. 90 tablet 3  . Multiple Vitamins-Minerals (EYE VITAMINS PO) Take 1 tablet by mouth 2 (two) times daily.     . nitroGLYCERIN (NITROSTAT) 0.4 MG SL tablet Place 1 tablet (0.4 mg total) under the tongue every 5 (five) minutes as needed for chest pain. 25 tablet 1  . nizatidine (AXID) 150 MG capsule Take 1 capsule by mouth daily. Must keep 04/29/16 appt for future refills 30 capsule 0  . omeprazole (PRILOSEC) 20 MG capsule Take 1 capsule (20 mg total) by mouth daily. 90 capsule 3  . rosuvastatin (CRESTOR) 20 MG tablet Take 1 tablet (20 mg total) by mouth daily. 90 tablet 3  . traMADol (ULTRAM) 50 MG tablet Take 1 tablet (50 mg total) by mouth every 6 (six) hours as needed (pain). 30 tablet 0  . Vitamin D, Ergocalciferol, (DRISDOL) 50000 units CAPS capsule Take 1 capsule (50,000 Units total) by mouth once a week. 12 capsule 3  . apixaban (ELIQUIS) 5 MG TABS tablet Take 1 tablet (5 mg total) by mouth 2 (two) times daily. 60 tablet 11  . Magnesium Oxide 400 MG CAPS Take 1 capsule (400 mg total) by mouth 2 (two) times daily. 60 capsule 11  . sacubitril-valsartan (ENTRESTO) 24-26 MG Take 1 tablet by mouth 2 (two) times daily. 60 tablet 3   No current facility-administered medications for this encounter.     BP 140/68   Pulse 67   Wt 191 lb 12 oz (87 kg)   SpO2 98%   BMI 33.97 kg/m  General: NAD Neck: No JVD, no thyromegaly or thyroid nodule.  Lungs: Clear to auscultation bilaterally with normal  respiratory effort. CV: Nondisplaced PMI.  Heart regular S1/S2, no S3/S4, 1/6 SEM RUSB.  Trace ankle edema.  No carotid bruit.  Normal pedal pulses.  Abdomen: Soft, nontender, no hepatosplenomegaly, no distention.  Skin: Intact without lesions or rashes.  Neurologic: Alert and oriented x 3.  Psych: Normal affect. Extremities: No clubbing or cyanosis.  HEENT: Normal.   Assessment/Plan 1. Atrial fibrillation  Paroxysmal, she is in NSR today.  - Continue amiodarone 200 mg daily. Recent LFTs normal.  She is on Levoxyl followed by PCP. She will need regular eye exams.  - We will transition her from warfarin to Eliquis 5 mg bid.  2. Coronary artery disease   No evidence of ischemia.  Nonobstructive disease on last cath.  - Continue Crestor 20 mg daily.  Good lipids in 1/19.   3. Ventricular tachycardia On amiodarone with suppression of VT.  4. Chronic systolic CHF: Ischemic cardiomyopathy NYHA class II symptoms, stable.  She is not volume overloaded. EF 35-40% in 12/18.  - Stop lisinopril, start Entresto 24/26 bid after 36 hrs.  After starting Entresto, I think that she could stop Lasix. Check BMET in 10 days.  - Continue Coreg 12.5 mg tid. - Next step will be spironolactone.  5. CKD: BMET today.    Followup in 2 months.   Loralie Champagne 04/29/2017

## 2017-04-30 ENCOUNTER — Ambulatory Visit: Payer: Medicare PPO

## 2017-05-11 ENCOUNTER — Ambulatory Visit (HOSPITAL_COMMUNITY)
Admission: RE | Admit: 2017-05-11 | Discharge: 2017-05-11 | Disposition: A | Discharge status: Home / Self Care | Payer: Medicare PPO | Source: Ambulatory Visit | Attending: Cardiology | Admitting: Cardiology

## 2017-05-11 DIAGNOSIS — I5022 Chronic systolic (congestive) heart failure: Secondary | ICD-10-CM | POA: Diagnosis not present

## 2017-05-11 LAB — BASIC METABOLIC PANEL
ANION GAP: 15 (ref 5–15)
BUN: 25 mg/dL — AB (ref 6–20)
CO2: 19 mmol/L — ABNORMAL LOW (ref 22–32)
Calcium: 9.1 mg/dL (ref 8.9–10.3)
Chloride: 108 mmol/L (ref 101–111)
Creatinine, Ser: 1.35 mg/dL — ABNORMAL HIGH (ref 0.44–1.00)
GFR calc Af Amer: 45 mL/min — ABNORMAL LOW (ref >60)
GFR, EST NON AFRICAN AMERICAN: 39 mL/min — AB (ref >60)
GLUCOSE: 181 mg/dL — AB (ref 65–99)
POTASSIUM: 5.2 mmol/L — AB (ref 3.5–5.1)
SODIUM: 142 mmol/L (ref 135–145)

## 2017-05-11 NOTE — Progress Notes (Signed)
error 

## 2017-05-12 ENCOUNTER — Telehealth (HOSPITAL_COMMUNITY): Payer: Self-pay

## 2017-05-12 DIAGNOSIS — I5022 Chronic systolic (congestive) heart failure: Secondary | ICD-10-CM

## 2017-05-12 NOTE — Telephone Encounter (Signed)
Notes recorded by Shirley Muscat, RN on 05/12/2017 at 3:28 PM EST Pt denies K+ supplement, recalls drinking OJ. Lab appointment made (orders placed)   ------  Notes recorded by Larey Dresser, MD on 05/11/2017 at 3:22 PM EST Make sure no K supplement, low K diet, repeat BMET 1 week.

## 2017-05-17 ENCOUNTER — Encounter: Payer: Self-pay | Admitting: Internal Medicine

## 2017-05-17 ENCOUNTER — Ambulatory Visit: Payer: Medicare PPO | Admitting: Internal Medicine

## 2017-05-17 ENCOUNTER — Ambulatory Visit (HOSPITAL_COMMUNITY)
Admission: RE | Admit: 2017-05-17 | Discharge: 2017-05-17 | Disposition: A | Discharge status: Home / Self Care | Payer: Medicare PPO | Source: Ambulatory Visit | Attending: Cardiology | Admitting: Cardiology

## 2017-05-17 VITALS — BP 126/74 | HR 71 | Ht 63.0 in | Wt 193.0 lb

## 2017-05-17 DIAGNOSIS — I5022 Chronic systolic (congestive) heart failure: Secondary | ICD-10-CM | POA: Diagnosis not present

## 2017-05-17 DIAGNOSIS — I480 Paroxysmal atrial fibrillation: Secondary | ICD-10-CM

## 2017-05-17 DIAGNOSIS — Z95810 Presence of automatic (implantable) cardiac defibrillator: Secondary | ICD-10-CM | POA: Diagnosis not present

## 2017-05-17 DIAGNOSIS — I472 Ventricular tachycardia, unspecified: Secondary | ICD-10-CM

## 2017-05-17 LAB — BASIC METABOLIC PANEL
Anion gap: 10 (ref 5–15)
BUN: 23 mg/dL — ABNORMAL HIGH (ref 6–20)
CHLORIDE: 111 mmol/L (ref 101–111)
CO2: 21 mmol/L — ABNORMAL LOW (ref 22–32)
CREATININE: 1.35 mg/dL — AB (ref 0.44–1.00)
Calcium: 8.9 mg/dL (ref 8.9–10.3)
GFR calc Af Amer: 45 mL/min — ABNORMAL LOW (ref >60)
GFR calc non Af Amer: 39 mL/min — ABNORMAL LOW (ref >60)
GLUCOSE: 191 mg/dL — AB (ref 65–99)
Potassium: 4.9 mmol/L (ref 3.5–5.1)
Sodium: 142 mmol/L (ref 135–145)

## 2017-05-17 LAB — CUP PACEART INCLINIC DEVICE CHECK
Date Time Interrogation Session: 20190225130430
Implantable Lead Implant Date: 20070117
Implantable Lead Location: 753860
Implantable Lead Model: 7040
MDC IDC LEAD IMPLANT DT: 20070117
MDC IDC LEAD LOCATION: 753859
MDC IDC PG IMPLANT DT: 20130212
Pulse Gen Serial Number: 819806

## 2017-05-17 MED ORDER — AMIODARONE HCL 100 MG PO TABS: 100.0000 mg | ORAL_TABLET | Freq: Every day | ORAL | 3 refills | Status: DC

## 2017-05-17 NOTE — Progress Notes (Signed)
PCP: Biagio Borg, MD Primary Cardiologist:  Dr Aundra Dubin Primary EP: Dr Rayann Heman  [name] is a 72 y.o. female who presents today for routine electrophysiology followup.  Since last being seen in our clinic, the patient reports doing very well.  She now lives in [place] but receives care in [place].  Doing well.  No recent arrhythmias.  Today, she denies symptoms of palpitations, chest pain, shortness of breath,  lower extremity edema, dizziness, presyncope, syncope, or ICD shocks.  The patient is otherwise without complaint today.   Past Medical History:  Diagnosis Date  . Allergy   . Arthritis   . Atrial fibrillation ([place])    controlled with amiodarone, on coumadin  . Chronic renal insufficiency   . Chronic systolic heart failure ([place])   . Congestive heart failure ([place])   . Coronary artery disease 01/31/2011  . Diabetes mellitus    type 1  . 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   . History of chicken pox   . Hyperlipidemia   . Hypertension   . Ischemic cardiomyopathy   . Lumbar spondylosis 01/11/2012  . Stroke [facility]) 2010   eye doctor said she had TIA  . Ventricular tachycardia ([place])    Polymorphic   Past Surgical History:  Procedure Laterality Date  . ABDOMINAL HYSTERECTOMY  1995  . CARDIAC DEFIBRILLATOR PLACEMENT    . CHOLECYSTECTOMY  1985  . ICD  2003/2007   implanted by Dr Rollene Fare, most recent generator change 2/13 by Dr Rayann Heman, Analyze ST study patient  . IMPLANTABLE CARDIOVERTER DEFIBRILLATOR (ICD) GENERATOR CHANGE N/A 05/05/2011   Procedure: ICD GENERATOR CHANGE;  Surgeon: Thompson Grayer, MD;  Location: [facility] CATH LAB;  Service: Cardiovascular;  Laterality: N/A;  . LEFT HEART CATHETERIZATION WITH CORONARY ANGIOGRAM N/A 01/30/2011   Procedure: LEFT HEART CATHETERIZATION WITH CORONARY ANGIOGRAM;  Surgeon: Larey Dresser, MD;  Location: [facility] CATH LAB;  Service: Cardiovascular;  Laterality: N/A;  . TUBALIGATION  1980    ROS- all systems are  reviewed and negative except as per HPI above  Current Outpatient Medications  Medication Sig Dispense Refill  . amiodarone (PACERONE) 200 MG tablet TAKE 1 TABLET (200 MG TOTAL) BY MOUTH DAILY. 90 tablet 3  . apixaban (ELIQUIS) 5 MG TABS tablet Take 1 tablet (5 mg total) by mouth 2 (two) times daily. 60 tablet 11  . Blood Glucose Monitoring Suppl (ACCU-CHEK NANO SMARTVIEW) W/DEVICE KIT Use to test blood sugar 4 times daily as instructed. Dx code: E11.59 1 kit 0  . carvedilol (COREG) 25 MG tablet TAKE 1/2 TABLET TWICE DAILY WITH A MEAL 90 tablet 3  . Cholecalciferol (VITAMIN D3) 2000 UNITS TABS Take 2,000 Units by mouth daily.     [name] dicyclomine (BENTYL) 10 MG capsule TAKE 1 CAPSULE (10 MG TOTAL) BY MOUTH 4 (FOUR) TIMES DAILY BEFORE MEALS AND AT BEDTIME. 120 capsule 1  . gabapentin (NEURONTIN) 400 MG capsule TAKE 1 CAPSULE (400 MG TOTAL) BY MOUTH 2 (TWO) TIMES DAILY. 180 capsule 0  . glucose blood (ACCU-CHEK SMARTVIEW) test strip Use to test blood sugar 4 times daily as instructed. Dx code E11.59 200 each 11  . insulin aspart (NOVOLOG) 100 UNIT/ML injection INJECT 28 UNITS INTO THE SKIN 3 (THREE) TIMES DAILY BEFORE MEALS. 10 mL 2  . Insulin Detemir (LEVEMIR FLEXTOUCH) 100 UNIT/ML Pen Inject under skin 25 units at bedtime (Patient taking differently: 28 Units. Inject under skin 25 units at bedtime) 45 mL 1  . levothyroxine (SYNTHROID, LEVOTHROID) 25 MCG  tablet Take 1 tablet (25 mcg total) by mouth daily before breakfast. 90 tablet 3  . Magnesium Oxide 400 MG CAPS Take 1 capsule (400 mg total) by mouth 2 (two) times daily. 60 capsule 11  . metFORMIN (GLUCOPHAGE-XR) 500 MG 24 hr tablet Take 4 tablets (2,000 mg total) by mouth daily with breakfast. 360 tablet 3  . montelukast (SINGULAIR) 10 MG tablet TAKE 1 TABLET (10 MG TOTAL) BY MOUTH DAILY. 90 tablet 3  . Multiple Vitamins-Minerals (EYE VITAMINS PO) Take 1 tablet by mouth 2 (two) times daily.     . nitroGLYCERIN (NITROSTAT) 0.4 MG SL tablet Place 1  tablet (0.4 mg total) under the tongue every 5 (five) minutes as needed for chest pain. 25 tablet 1  . nizatidine (AXID) 150 MG capsule Take 1 capsule by mouth daily. Must keep 04/29/16 appt for future refills 30 capsule 0  . omeprazole (PRILOSEC) 20 MG capsule Take 1 capsule (20 mg total) by mouth daily. 90 capsule 3  . rosuvastatin (CRESTOR) 20 MG tablet Take 1 tablet (20 mg total) by mouth daily. 90 tablet 3  . sacubitril-valsartan (ENTRESTO) 24-26 MG Take 1 tablet by mouth 2 (two) times daily. 60 tablet 3  . traMADol (ULTRAM) 50 MG tablet Take 1 tablet (50 mg total) by mouth every 6 (six) hours as needed (pain). 30 tablet 0  . Vitamin D, Ergocalciferol, (DRISDOL) 50000 units CAPS capsule Take 1 capsule (50,000 Units total) by mouth once a week. 12 capsule 3   No current facility-administered medications for this visit.     Physical Exam: Vitals:   05/17/17 1229  BP: 126/74  Pulse: 71  Weight: 193 lb (87.5 kg)  Height: '5\' 3"'$  (1.6 m)    GEN- The patient is well appearing, alert and oriented x 3 today.   Head- normocephalic, atraumatic Eyes-  Sclera clear, conjunctiva pink Ears- hearing intact Oropharynx- clear Lungs- Clear to ausculation bilaterally, normal work of breathing Chest- ICD pocket is well healed Heart- Regular rate and rhythm, no murmurs, rubs or gallops, PMI not laterally displaced GI- soft, NT, ND, + BS Extremities- no clubbing, cyanosis, or edema  ICD interrogation- reviewed in detail today,  See PACEART report   Assessment and Plan:  1.  Chronic systolic dysfunction euvolemic today Stable on an appropriate medical regimen Normal ICD function See Pace Art report No changes today We discussed SJM ICD battery advisory again today.  Battery performance alert loaded and vibratory alert demonstrated.  Will continue to follow.  2. VT No episodes with amiodarone Labs 1/19 reviewed TSH is elevated.  Started on synthroid by Dr Jenny Reichmann (who will continue to follow  closely) Reduce amiodarone to '100mg'$  daily today  3. afib Well controlled Reduce amiodarone to '100mg'$  daily chads2vasc score is 8 On eliquis   Follow-up with Dr Aundra Dubin as scheduled [place] Return to see EP NP in a year  Thompson Grayer MD, [facility] 05/17/2017 12:56 PM

## 2017-05-17 NOTE — Patient Instructions (Addendum)
Medication Instructions:  Your physician has recommended you make the following change in your medication:  1.  Decrease your amiodarone.   Take amiodarone 200 mg (1/2 tablet) by mouth daily. When your 200 mg tablets are gone, your refill will be for 100 mg tablets.  At that time you will change back to a whole tab.  Labwork: None ordered.  Testing/Procedures: None ordered.  Follow-Up: Your physician wants you to follow-up in: one year with Chanetta Marshall, NP.  You will receive a reminder letter in the mail two months in advance. If you don't receive a letter, please call our office to schedule the follow-up appointment.  Remote monitoring is used to monitor your ICD from home. This monitoring reduces the number of office visits required to check your device to one time per year. It allows [name] to keep an eye on the functioning of your device to ensure it is working properly. You are scheduled for a device check from home on 07/19/2017. You may send your transmission at any time that day. If you have a wireless device, the transmission will be sent automatically. After your physician reviews your transmission, you will receive a postcard with your next transmission date.  Any Other Special Instructions Will Be Listed Below (If Applicable).  If you need a refill on your cardiac medications before your next appointment, please call your pharmacy.

## 2017-05-23 ENCOUNTER — Other Ambulatory Visit: Payer: Self-pay | Admitting: Internal Medicine

## 2017-05-30 ENCOUNTER — Other Ambulatory Visit: Payer: Self-pay | Admitting: Internal Medicine

## 2017-05-31 NOTE — Telephone Encounter (Signed)
Done erx 

## 2017-06-01 ENCOUNTER — Encounter (HOSPITAL_COMMUNITY): Payer: Self-pay | Admitting: Cardiology

## 2017-06-01 ENCOUNTER — Other Ambulatory Visit: Payer: Self-pay

## 2017-06-01 ENCOUNTER — Ambulatory Visit (HOSPITAL_COMMUNITY)
Admission: RE | Admit: 2017-06-01 | Discharge: 2017-06-01 | Disposition: A | Discharge status: Home / Self Care | Payer: Medicare PPO | Source: Ambulatory Visit | Attending: Cardiology | Admitting: Cardiology

## 2017-06-01 VITALS — BP 123/80 | HR 67 | Wt 195.5 lb

## 2017-06-01 DIAGNOSIS — Z8249 Family history of ischemic heart disease and other diseases of the circulatory system: Secondary | ICD-10-CM | POA: Insufficient documentation

## 2017-06-01 DIAGNOSIS — I480 Paroxysmal atrial fibrillation: Secondary | ICD-10-CM | POA: Insufficient documentation

## 2017-06-01 DIAGNOSIS — I255 Ischemic cardiomyopathy: Secondary | ICD-10-CM | POA: Insufficient documentation

## 2017-06-01 DIAGNOSIS — I2510 Atherosclerotic heart disease of native coronary artery without angina pectoris: Secondary | ICD-10-CM | POA: Insufficient documentation

## 2017-06-01 DIAGNOSIS — Z794 Long term (current) use of insulin: Secondary | ICD-10-CM | POA: Diagnosis not present

## 2017-06-01 DIAGNOSIS — N183 Chronic kidney disease, stage 3 (moderate): Secondary | ICD-10-CM | POA: Diagnosis not present

## 2017-06-01 DIAGNOSIS — I5022 Chronic systolic (congestive) heart failure: Secondary | ICD-10-CM | POA: Insufficient documentation

## 2017-06-01 DIAGNOSIS — Z95810 Presence of automatic (implantable) cardiac defibrillator: Secondary | ICD-10-CM | POA: Diagnosis not present

## 2017-06-01 DIAGNOSIS — E1122 Type 2 diabetes mellitus with diabetic chronic kidney disease: Secondary | ICD-10-CM | POA: Diagnosis not present

## 2017-06-01 DIAGNOSIS — I472 Ventricular tachycardia, unspecified: Secondary | ICD-10-CM

## 2017-06-01 DIAGNOSIS — Z79899 Other long term (current) drug therapy: Secondary | ICD-10-CM | POA: Diagnosis not present

## 2017-06-01 DIAGNOSIS — Z87891 Personal history of nicotine dependence: Secondary | ICD-10-CM | POA: Diagnosis not present

## 2017-06-01 DIAGNOSIS — Z955 Presence of coronary angioplasty implant and graft: Secondary | ICD-10-CM | POA: Insufficient documentation

## 2017-06-01 DIAGNOSIS — I130 Hypertensive heart and chronic kidney disease with heart failure and stage 1 through stage 4 chronic kidney disease, or unspecified chronic kidney disease: Secondary | ICD-10-CM | POA: Insufficient documentation

## 2017-06-01 DIAGNOSIS — Z8673 Personal history of transient ischemic attack (TIA), and cerebral infarction without residual deficits: Secondary | ICD-10-CM | POA: Insufficient documentation

## 2017-06-01 DIAGNOSIS — E039 Hypothyroidism, unspecified: Secondary | ICD-10-CM | POA: Insufficient documentation

## 2017-06-01 DIAGNOSIS — Z7901 Long term (current) use of anticoagulants: Secondary | ICD-10-CM | POA: Insufficient documentation

## 2017-06-01 DIAGNOSIS — I351 Nonrheumatic aortic (valve) insufficiency: Secondary | ICD-10-CM | POA: Insufficient documentation

## 2017-06-01 LAB — COMPREHENSIVE METABOLIC PANEL
ALT: <5 U/L — ABNORMAL LOW (ref 14–54)
ANION GAP: 8 (ref 5–15)
AST: 25 U/L (ref 15–41)
Albumin: 3.5 g/dL (ref 3.5–5.0)
Alkaline Phosphatase: 65 U/L (ref 38–126)
BILIRUBIN TOTAL: 1.5 mg/dL — AB (ref 0.3–1.2)
BUN: 34 mg/dL — AB (ref 6–20)
CO2: 21 mmol/L — ABNORMAL LOW (ref 22–32)
Calcium: 8.8 mg/dL — ABNORMAL LOW (ref 8.9–10.3)
Chloride: 113 mmol/L — ABNORMAL HIGH (ref 101–111)
Creatinine, Ser: 1.34 mg/dL — ABNORMAL HIGH (ref 0.44–1.00)
GFR, EST AFRICAN AMERICAN: 45 mL/min — AB (ref >60)
GFR, EST NON AFRICAN AMERICAN: 39 mL/min — AB (ref >60)
Glucose, Bld: 100 mg/dL — ABNORMAL HIGH (ref 65–99)
POTASSIUM: 5.4 mmol/L — AB (ref 3.5–5.1)
Sodium: 142 mmol/L (ref 135–145)
TOTAL PROTEIN: 6.6 g/dL (ref 6.5–8.1)

## 2017-06-01 MED ORDER — FUROSEMIDE 20 MG PO TABS: 20.0000 mg | ORAL_TABLET | Freq: Every day | ORAL | 3 refills | Status: DC

## 2017-06-01 MED ORDER — SACUBITRIL-VALSARTAN 49-51 MG PO TABS: 1.0000 | ORAL_TABLET | Freq: Two times a day (BID) | ORAL | 3 refills | Status: DC

## 2017-06-01 NOTE — Progress Notes (Signed)
Patient ID: [name], female   DOB: [birth year], 72 y.o.   MRN: [id number] PCP: Dr Jenny Reichmann Cardiology: Dr. Aundra Dubin  72 y.o. with history of CAD, ischemic cardiomyopathy, and atrial fibrillation presents for cardiology followup.  Patient was hospitalized in 11/12 at [facility] for VT with ICD discharge.  She had a left heart cath showing patent stents.  EF was 35-40% by echo.  She is on amiodarone.  She has had periodic problems with creatinine rising with medication adjustments.  Most recent echo in 12/18 showed EF 35-40%, inferior and septal hypokinesis, mild MR.    She returns today for HF follow up. She is doing okay, but concerned about weight gain. Does not weigh at home, but up 3 lbs from 2 weeks ago at Dr. Jackalyn [facility] clinic. At last appointment here, lisinopril and lasix stopped and she was started on entresto 24/26. No orthopnea or PND. Legs and hands are swollen. Has bad knees and back that limit her activity, but has no SOB with walking. No CP, palpitations, dizziness, or bleeding. Compliant with medications and diet.   ECG: a-paced, LVH (personally reviewed)  Labs (10/12): LDL 73, HDL 44 Labs (11/12): K 3.9, creatinine 1.1, LFTs normal, TSH normal Labs (12/12): K 3.9, creatinine 1.5, proBNP 18 Labs (1/13): LDL 82, HDL 57 Labs (2/13): K 4.3, creatinine 1.5 Labs (5/13): creatinine 2.4 => 1.7, LFTs normal, TSH normal, proBNP 18 Labs (6/13): K 4.2, creatinine 1.7 Labs (10/13): K 4.3, creatinine 1.4 Labs (4/14); LFTs normal, TSH normal, LDL 71, HDL 58 Labs (5/14): K 4.5, creatinine 1.4 Labs (8/14): TSH normal, LFTs normal Labs (9/14): K 4.2, creatinine 1.8, LDL 75, HDL 54 Labs (07/31/13) K 3.7 Creatinine 1.8 BNP 148  Labs (9/15): K 4.3, creatinine 1.24, LFTs normal, LDL 81, HDL 58, TSH normal Labs (9/16): K 4.4, creatinine 1.44, LDL 64, HDL 54, LFTs normal, TSH normal, HCT 38 Labs (2/17): K 4.9, creatinine 1.76, HCT 38.6 Labs (1/19): LDL 71, HDL 58, K 4.8, creatinine 1.26, LFTs  normal, hgb 12.9.  Labs (2/19): Creatinine 1.35, K 4.9  PMH: 1. Diabetes mellitus 2. CVA, TIA in 2010 3. HTN 4. CKD stage 3 5. H/o TAH 6. H/o CCY 7. Sciatica 8. Atrial fibrillation 9. CAD: s/p LAD and RCA PCI.  Last LHC in 11/12 with patent proximal LAD stent, ostial 70% D1 (jailed by stent), mild LAD stent patent, patent RCA stents, EF 40% with global hypokinesis.  10. Ischemic cardiomyopathy: Echo (10/12): EF 35-40%, moderate focal basal septal hypertrophy, inferior akinesis, grade I diastolic dysfunction, moderate aortic insufficiency. St Jude dual chamber ICD.  Spironolactone stopped when creatinine rose to 2.5.  Echo (5/14) with EF 40-45%, mild AI.  Echo (6/16) with EF 40-45%, inferior/inferoseptal hypokinesis, mild LVH.  - Echo (12/18): EF 35-40%, inferior and septal hypokinesis, mild MR, mild AI.  11. Aortic insufficiency: moderate in past but mild on most recent echo.   12. 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  13. Gastroparesis 14. Hypothyroidism  SH: Divorced.  3 children.  Quit smoking in 1997. Lives in [place] now with daughter.  [place].   FH: CAD  ROS: All systems reviewed and negative except as per HPI.   Current Outpatient Medications  Medication Sig Dispense Refill  . amiodarone (PACERONE) 100 MG tablet Take 1 tablet (100 mg total) by mouth daily. 90 tablet 3  . apixaban (ELIQUIS) 5 MG TABS tablet Take 1 tablet (5 mg total) by mouth 2 (two) times daily. 60 tablet 11  . Blood  Glucose Monitoring Suppl (ACCU-CHEK NANO SMARTVIEW) W/DEVICE KIT Use to test blood sugar 4 times daily as instructed. Dx code: E11.59 1 kit 0  . carvedilol (COREG) 25 MG tablet TAKE 1/2 TABLET TWICE DAILY WITH A MEAL 90 tablet 3  . Cholecalciferol (VITAMIN D3) 2000 UNITS TABS Take 2,000 Units by mouth daily.     [name] dicyclomine (BENTYL) 10 MG capsule TAKE 1 CAPSULE (10 MG TOTAL) BY MOUTH 4 (FOUR) TIMES DAILY BEFORE MEALS AND AT BEDTIME. 120 capsule 1  . gabapentin (NEURONTIN) 400 MG capsule TAKE  1 CAPSULE (400 MG TOTAL) BY MOUTH 2 (TWO) TIMES DAILY. 180 capsule 1  . glucose blood (ACCU-CHEK SMARTVIEW) test strip Use to test blood sugar 4 times daily as instructed. Dx code E11.59 200 each 11  . insulin aspart (NOVOLOG) 100 UNIT/ML injection INJECT 28 UNITS INTO THE SKIN 3 (THREE) TIMES DAILY BEFORE MEALS. 10 mL 2  . Insulin Detemir (LEVEMIR FLEXTOUCH) 100 UNIT/ML Pen Inject under skin 25 units at bedtime (Patient taking differently: 28 Units. Inject under skin 25 units at bedtime) 45 mL 1  . levothyroxine (SYNTHROID, LEVOTHROID) 25 MCG tablet Take 1 tablet (25 mcg total) by mouth daily before breakfast. 90 tablet 3  . Magnesium Oxide 400 MG CAPS Take 1 capsule (400 mg total) by mouth 2 (two) times daily. 60 capsule 11  . metFORMIN (GLUCOPHAGE-XR) 500 MG 24 hr tablet Take 4 tablets (2,000 mg total) by mouth daily with breakfast. 360 tablet 3  . montelukast (SINGULAIR) 10 MG tablet TAKE 1 TABLET (10 MG TOTAL) BY MOUTH DAILY. 90 tablet 3  . Multiple Vitamins-Minerals (EYE VITAMINS PO) Take 1 tablet by mouth 2 (two) times daily.     . nitroGLYCERIN (NITROSTAT) 0.4 MG SL tablet Place 1 tablet (0.4 mg total) under the tongue every 5 (five) minutes as needed for chest pain. 25 tablet 1  . nizatidine (AXID) 150 MG capsule Take 1 capsule by mouth daily. Must keep 04/29/16 appt for future refills 30 capsule 0  . omeprazole (PRILOSEC) 20 MG capsule Take 1 capsule (20 mg total) by mouth daily. 90 capsule 3  . rosuvastatin (CRESTOR) 20 MG tablet Take 1 tablet (20 mg total) by mouth daily. 90 tablet 3  . sacubitril-valsartan (ENTRESTO) 24-26 MG Take 1 tablet by mouth 2 (two) times daily. 60 tablet 3  . traMADol (ULTRAM) 50 MG tablet Take 1 tablet (50 mg total) by mouth every 6 (six) hours as needed (pain). 30 tablet 0  . Vitamin D, Ergocalciferol, (DRISDOL) 50000 units CAPS capsule Take 1 capsule (50,000 Units total) by mouth once a week. 12 capsule 3   No current facility-administered medications for this  encounter.    Filed Weights   06/01/17 1154  Weight: 195 lb 8 oz (88.7 kg)    BP 123/80   Pulse 67   Wt 195 lb 8 oz (88.7 kg)   SpO2 99%   BMI 34.63 kg/m  General:No resp difficulty. HEENT: Normal Neck: Supple. JVP 8-10. Carotids 2+ bilat; no bruits. No thyromegaly or nodule noted. Cor: PMI nondisplaced. RRR, 2/6 RUSB  Lungs: CTAB, normal effort. Abdomen: Soft, non-tender, non-distended, no HSM. No bruits or masses. +BS  Extremities: No cyanosis, clubbing, or rash. BUE, BLE non pitting edema Neuro: Alert & orientedx3, cranial nerves grossly intact. moves all 4 extremities w/o difficulty. Affect pleasant   Assessment/Plan 1. Atrial fibrillation  - Paroxysmal, She sounds regular on exam today.  - Continue amiodarone 100 mg daily (recent decrease by Dr Rayann Heman).  Recent LFTs normal (03/2017). She is on Levoxyl followed by PCP. She will need regular eye exams.  - Levoxyl is managed by her PCP - Continue Eliquis 5 mg bid. No s/s bleeding.  2. Coronary artery disease   - No s/s ischemia. Nonobstructive disease on last cath.  - Continue Crestor 20 mg daily.  Good lipids in 1/19.   3. Ventricular tachycardia - On amiodarone 100 mg daily with suppression of VT. No change 4. Chronic systolic CHF: Ischemic cardiomyopathy NYHA class II symptoms, volume status elevated.  EF 35-40% in 12/18. She has a [employer] ICD - Increase Entresto to 49/51 mg BID  - Continue Coreg 12.5 mg tid. - Restart lasix 20 mg daily - Consider spiro in future, but potassium has been on higher side - Discussed importance of daily weights 5. CKD: CMET today  6. Hypothyroidism - TSH 6.0 03/2017  - Her PCP manages her synthroid   Restart lasix 20 mg daily Increase Entresto to 49/51 mg BID CMET today BMET in 10 days  Georgiana Shore, NP 06/01/2017   Patient seen with NP, agree with the above note.  Weight is up off Lasix and she has mild volume overload on exam.  Recent diagnosis of hypothyroidism, amiodarone  decreased to 100 mg daily.  - Add back Lasix 20 mg daily.  - Increase Entresto to 49/51 bid.  - BMET today and again in 10 days.    Loralie Champagne 06/02/2017

## 2017-06-01 NOTE — Patient Instructions (Signed)
Start Furosemide 20 mg (1 tab) daily  Increase Entresto 49/51 mg (1 tab), twice a day  Labs drawn today (if we do not call you, then your lab work was stable)   Your physician recommends that you return for lab work in: 10 days  Your physician recommends that you schedule a follow-up appointment in: 2 months with NP/AP clinic

## 2017-06-07 ENCOUNTER — Other Ambulatory Visit (HOSPITAL_COMMUNITY): Payer: Self-pay | Admitting: *Deleted

## 2017-06-07 MED ORDER — SACUBITRIL-VALSARTAN 49-51 MG PO TABS: 1.0000 | ORAL_TABLET | Freq: Two times a day (BID) | ORAL | 3 refills | Status: DC

## 2017-06-11 ENCOUNTER — Ambulatory Visit (HOSPITAL_COMMUNITY)
Admission: RE | Admit: 2017-06-11 | Discharge: 2017-06-11 | Disposition: A | Discharge status: Home / Self Care | Payer: Medicare PPO | Source: Ambulatory Visit | Attending: Cardiology | Admitting: Cardiology

## 2017-06-11 ENCOUNTER — Other Ambulatory Visit (HOSPITAL_COMMUNITY): Payer: Self-pay | Admitting: *Deleted

## 2017-06-11 DIAGNOSIS — I5022 Chronic systolic (congestive) heart failure: Secondary | ICD-10-CM | POA: Diagnosis not present

## 2017-06-11 LAB — BASIC METABOLIC PANEL
Anion gap: 16 — ABNORMAL HIGH (ref 5–15)
BUN: 27 mg/dL — AB (ref 6–20)
CALCIUM: 8.9 mg/dL (ref 8.9–10.3)
CHLORIDE: 111 mmol/L (ref 101–111)
CO2: 15 mmol/L — ABNORMAL LOW (ref 22–32)
CREATININE: 1.59 mg/dL — AB (ref 0.44–1.00)
GFR calc Af Amer: 37 mL/min — ABNORMAL LOW (ref >60)
GFR, EST NON AFRICAN AMERICAN: 32 mL/min — AB (ref >60)
Glucose, Bld: 230 mg/dL — ABNORMAL HIGH (ref 65–99)
Potassium: 5.5 mmol/L — ABNORMAL HIGH (ref 3.5–5.1)
SODIUM: 142 mmol/L (ref 135–145)

## 2017-06-11 MED ORDER — SACUBITRIL-VALSARTAN 49-51 MG PO TABS: 1.0000 | ORAL_TABLET | Freq: Two times a day (BID) | ORAL | 3 refills | Status: DC

## 2017-06-11 MED ORDER — FUROSEMIDE 20 MG PO TABS: 20.0000 mg | ORAL_TABLET | Freq: Every day | ORAL | 3 refills | Status: DC

## 2017-06-14 ENCOUNTER — Telehealth (HOSPITAL_COMMUNITY): Payer: Self-pay

## 2017-06-14 DIAGNOSIS — I5022 Chronic systolic (congestive) heart failure: Secondary | ICD-10-CM

## 2017-06-14 NOTE — Telephone Encounter (Signed)
Notes recorded by Shirley Muscat, RN on 06/14/2017 at 12:27 PM EDT Pt aware of results, pt states she is not taking Potassium supplements (but drinking OJ), and Lab appointment made (orders placed)   ------  Notes recorded by Larey Dresser, MD on 06/13/2017 at 10:58 PM EDT Follow low K diet, make sure no K supplement. Repeat BMET in 1 week.

## 2017-06-15 ENCOUNTER — Ambulatory Visit: Payer: Self-pay | Admitting: General Practice

## 2017-06-23 ENCOUNTER — Ambulatory Visit (HOSPITAL_COMMUNITY)
Admission: RE | Admit: 2017-06-23 | Discharge: 2017-06-23 | Disposition: A | Discharge status: Home / Self Care | Payer: Medicare PPO | Source: Ambulatory Visit | Attending: Internal Medicine | Admitting: Internal Medicine

## 2017-06-23 ENCOUNTER — Telehealth (HOSPITAL_COMMUNITY): Payer: Self-pay

## 2017-06-23 DIAGNOSIS — I5022 Chronic systolic (congestive) heart failure: Secondary | ICD-10-CM

## 2017-06-23 LAB — BASIC METABOLIC PANEL
Anion gap: 11 (ref 5–15)
BUN: 35 mg/dL — ABNORMAL HIGH (ref 6–20)
CALCIUM: 8.9 mg/dL (ref 8.9–10.3)
CO2: 22 mmol/L (ref 22–32)
CREATININE: 1.64 mg/dL — AB (ref 0.44–1.00)
Chloride: 106 mmol/L (ref 101–111)
GFR calc non Af Amer: 31 mL/min — ABNORMAL LOW (ref >60)
GFR, EST AFRICAN AMERICAN: 36 mL/min — AB (ref >60)
Glucose, Bld: 201 mg/dL — ABNORMAL HIGH (ref 65–99)
Potassium: 5.6 mmol/L — ABNORMAL HIGH (ref 3.5–5.1)
SODIUM: 139 mmol/L (ref 135–145)

## 2017-06-23 NOTE — Telephone Encounter (Signed)
Patient left entresto form at CHF clinic while getting lab work today.  Attempted to return call to patient and left VM to inform per CHF clinical pharmacist Doroteo Bradford that [employer] foundation may take some time, and as such I have left samples for patient at front desk.  Renee Pain, RN

## 2017-06-23 NOTE — Telephone Encounter (Signed)
Notes recorded by Shirley Muscat, RN on 06/23/2017 at 3:40 PM EDT Pt aware of results (not taking K+ supplement) and Lab appointment made (orders placed) ------  Notes recorded by Larey Dresser, MD on 06/23/2017 at 2:44 PM EDT Make sure she is not on a K supplement. Given hemolysis, would repeat BMET stat.

## 2017-06-24 ENCOUNTER — Other Ambulatory Visit (HOSPITAL_COMMUNITY): Payer: Medicare PPO

## 2017-06-24 ENCOUNTER — Ambulatory Visit (HOSPITAL_COMMUNITY)
Admission: RE | Admit: 2017-06-24 | Discharge: 2017-06-24 | Disposition: A | Discharge status: Home / Self Care | Payer: Medicare PPO | Source: Ambulatory Visit | Attending: Internal Medicine | Admitting: Internal Medicine

## 2017-06-24 DIAGNOSIS — I5022 Chronic systolic (congestive) heart failure: Secondary | ICD-10-CM

## 2017-06-24 LAB — BASIC METABOLIC PANEL
Anion gap: 12 (ref 5–15)
BUN: 33 mg/dL — AB (ref 6–20)
CHLORIDE: 111 mmol/L (ref 101–111)
CO2: 21 mmol/L — AB (ref 22–32)
CREATININE: 1.63 mg/dL — AB (ref 0.44–1.00)
Calcium: 9 mg/dL (ref 8.9–10.3)
GFR calc Af Amer: 36 mL/min — ABNORMAL LOW (ref >60)
GFR calc non Af Amer: 31 mL/min — ABNORMAL LOW (ref >60)
Glucose, Bld: 210 mg/dL — ABNORMAL HIGH (ref 65–99)
POTASSIUM: 4.2 mmol/L (ref 3.5–5.1)
Sodium: 144 mmol/L (ref 135–145)

## 2017-07-02 ENCOUNTER — Encounter: Payer: Self-pay | Admitting: Internal Medicine

## 2017-07-02 ENCOUNTER — Ambulatory Visit: Payer: Medicare PPO | Admitting: Internal Medicine

## 2017-07-02 VITALS — BP 134/80 | HR 67 | Ht 63.0 in | Wt 192.4 lb

## 2017-07-02 DIAGNOSIS — E785 Hyperlipidemia, unspecified: Secondary | ICD-10-CM | POA: Diagnosis not present

## 2017-07-02 DIAGNOSIS — E1151 Type 2 diabetes mellitus with diabetic peripheral angiopathy without gangrene: Secondary | ICD-10-CM | POA: Diagnosis not present

## 2017-07-02 DIAGNOSIS — E1165 Type 2 diabetes mellitus with hyperglycemia: Secondary | ICD-10-CM

## 2017-07-02 DIAGNOSIS — E1142 Type 2 diabetes mellitus with diabetic polyneuropathy: Secondary | ICD-10-CM

## 2017-07-02 DIAGNOSIS — IMO0002 Reserved for concepts with insufficient information to code with codable children: Secondary | ICD-10-CM

## 2017-07-02 LAB — POCT GLYCOSYLATED HEMOGLOBIN (HGB A1C): HEMOGLOBIN A1C: 7.2

## 2017-07-02 MED ORDER — INSULIN DETEMIR 100 UNIT/ML FLEXPEN: 40.0000 [IU] | PEN_INJECTOR | Freq: Every day | SUBCUTANEOUS | 3 refills | Status: DC

## 2017-07-02 MED ORDER — INSULIN ASPART 100 UNIT/ML FLEXPEN: 10.0000 [IU] | PEN_INJECTOR | Freq: Three times a day (TID) | SUBCUTANEOUS | 3 refills | Status: DC

## 2017-07-02 NOTE — Patient Instructions (Addendum)
Please continue: - Metformin XR but move it with dinner (2000 mg)  Please change: - Levemir to 40 units at night - Novolog:  - 10 units before a small meal - 14 units before a large meal  Please return in 3 months with your sugar log.

## 2017-07-02 NOTE — Progress Notes (Signed)
Patient ID: [name], female   DOB: 1945/05/28, 72 y.o.   MRN: [id number]   HPI: [name] is a 72 y.o.-year-old female, returning for f/u for DM2, insulin-dependent, uncontrolled, with complications (CAD, ICM - had ICD, peripheral neuropathy, gastroparesis). Last visit 2 years and 3 months ago (!)  Last hemoglobin A1c was: Lab Results  Component Value Date   HGBA1C 8.1 (H) 04/08/2017   HGBA1C 8.1 (H) 01/05/2017   HGBA1C 8.2 (H) 06/25/2016  07/03/2013: 7.2% - through her insurance   Pt is on a regimen of: - Metformin XR 2000 mg with b'fast - Levemir 25 at bedtime >> 20 units 2x a day - increased last year - Mealtime Novolog doses:  20 units before the 3 meals Prev.:   Pt checks her sugars 3-4 times a day: - am: 50-145, 167, 205 >> 80-166, 182 >> 180-200 - after b'fast: 175 - before lunch: 50-166, 184 >> 58, 90-150, 160 >> 180 - after lunch: n/c >> 57x1 >> n/c >> 45, 47, 140-150 - before dinner: 53, 69-152, 222 >> 90-160, 170 >> n/c - bedtime: 52-191, 255 >> 75, 93, 145-190, 218, 288 >> 47, 170s Lowest sugar was 44 >> 58 >> 45;  she has hypoglycemia awareness in the 70s. Highest sugar was 297 >> 288 >> HI (steroid inj).  -+ CKD, last BUN/creatinine:  Lab Results  Component Value Date   BUN 33 (H) 06/24/2017   CREATININE 1.63 (H) 06/24/2017  ACR: Lab Results  Component Value Date   MICRALBCREAT 9.4 04/08/2017   MICRALBCREAT 3.2 06/25/2016   MICRALBCREAT 1.6 12/21/2014   MICRALBCREAT 1.0 12/15/2012  On Entresto. -+ HL; last set of lipids: Lab Results  Component Value Date   CHOL 151 04/08/2017   HDL 58.30 04/08/2017   LDLCALC 71 04/08/2017   TRIG 112.0 04/08/2017   CHOLHDL 3 04/08/2017  On Crestor. - last eye exam was in 2018: No DR - + Neuropathy: Numbness and tingling in her feet -on Neurontin.   ROS: Constitutional: + weight gain/+ weight loss, no fatigue, no subjective hyperthermia, no subjective hypothermia Eyes: no blurry vision, no  xerophthalmia ENT: no sore throat, no nodules palpated in throat, no dysphagia, no odynophagia, no hoarseness Cardiovascular: no CP/no SOB/no palpitations/no leg swelling Respiratory: no cough/no SOB/no wheezing Gastrointestinal: no N/no V/no D/no C/no acid reflux Musculoskeletal: no muscle aches/no joint aches Skin: no rashes, no hair loss Neurological: no tremors/+ numbness/+ tingling/no dizziness  I reviewed pt's medications, allergies, PMH, social hx, family hx, and changes were documented in the history of present illness. Otherwise, unchanged from my initial visit note. Started Eliquis. Low dose L4.   Past Medical History:  Diagnosis Date  . Allergy   . Arthritis   . Atrial fibrillation ([place])    controlled with amiodarone, on coumadin  . Chronic renal insufficiency   . Chronic systolic heart failure ([place])   . Congestive heart failure ([place])   . Coronary artery disease 01/31/2011  . Diabetes mellitus    type 1  . 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   . History of chicken pox   . Hyperlipidemia   . Hypertension   . Ischemic cardiomyopathy   . Lumbar spondylosis 01/11/2012  . Stroke [facility]) 2010   eye doctor said she had TIA  . Ventricular tachycardia ([place])    Polymorphic   Past Surgical History:  Procedure Laterality Date  . ABDOMINAL HYSTERECTOMY  1995  . CARDIAC DEFIBRILLATOR PLACEMENT    . CHOLECYSTECTOMY  1985  . ICD  2003/2007   implanted by Dr Rollene Fare, most recent generator change 2/13 by Dr Rayann Heman, Analyze ST study patient  . IMPLANTABLE CARDIOVERTER DEFIBRILLATOR (ICD) GENERATOR CHANGE N/A 05/05/2011   Procedure: ICD GENERATOR CHANGE;  Surgeon: Thompson Grayer, MD;  Location: [facility] CATH LAB;  Service: Cardiovascular;  Laterality: N/A;  . LEFT HEART CATHETERIZATION WITH CORONARY ANGIOGRAM N/A 01/30/2011   Procedure: LEFT HEART CATHETERIZATION WITH CORONARY ANGIOGRAM;  Surgeon: Larey Dresser, MD;  Location: [facility] CATH LAB;  Service: Cardiovascular;  Laterality:  N/A;  . [place] History   Socioeconomic History  . Marital status: Divorced    Spouse name: Not on file  . Number of children: 3  . Years of education: 33  . Highest education level: Not on file  Occupational History  . Occupation: Retried  Scientific laboratory technician  . Financial resource strain: Not on file  . Food insecurity:    Worry: Not on file    Inability: Not on file  . Transportation needs:    Medical: Not on file    Non-medical: Not on file  Tobacco Use  . Smoking status: Former Smoker    Last attempt to quit: 08/16/1995    Years since quitting: 21.8  . Smokeless tobacco: Never Used  Substance and Sexual Activity  . Alcohol use: No  . Drug use: No  . Sexual activity: Never    Birth control/protection: Post-menopausal  Lifestyle  . Physical activity:    Days per week: Not on file    Minutes per session: Not on file  . Stress: Not on file  Relationships  . Social connections:    Talks on phone: Not on file    Gets together: Not on file    Attends religious service: Not on file    Active member of club or organization: Not on file    Attends meetings of clubs or organizations: Not on file    Relationship status: Not on file  . Intimate partner violence:    Fear of current or ex partner: Not on file    Emotionally abused: Not on file    Physically abused: Not on file    Forced sexual activity: Not on file  Other Topics Concern  . Not on file  Social History Narrative   Regular exercise-no   Caffeine Use-no   Current Outpatient Medications on File Prior to Visit  Medication Sig Dispense Refill  . amiodarone (PACERONE) 100 MG tablet Take 1 tablet (100 mg total) by mouth daily. 90 tablet 3  . apixaban (ELIQUIS) 5 MG TABS tablet Take 1 tablet (5 mg total) by mouth 2 (two) times daily. 60 tablet 11  . Blood Glucose Monitoring Suppl (ACCU-CHEK NANO SMARTVIEW) W/DEVICE KIT Use to test blood sugar 4 times daily as instructed. Dx code: E11.59 1 kit 0  .  carvedilol (COREG) 25 MG tablet TAKE 1/2 TABLET TWICE DAILY WITH A MEAL 90 tablet 3  . Cholecalciferol (VITAMIN D3) 2000 UNITS TABS Take 2,000 Units by mouth daily.     [name] dicyclomine (BENTYL) 10 MG capsule TAKE 1 CAPSULE (10 MG TOTAL) BY MOUTH 4 (FOUR) TIMES DAILY BEFORE MEALS AND AT BEDTIME. 120 capsule 1  . furosemide (LASIX) 20 MG tablet Take 1 tablet (20 mg total) by mouth daily. 30 tablet 3  . gabapentin (NEURONTIN) 400 MG capsule TAKE 1 CAPSULE (400 MG TOTAL) BY MOUTH 2 (TWO) TIMES DAILY. 180 capsule 1  . glucose blood (ACCU-CHEK SMARTVIEW)  test strip Use to test blood sugar 4 times daily as instructed. Dx code E11.59 200 each 11  . insulin aspart (NOVOLOG) 100 UNIT/ML injection INJECT 28 UNITS INTO THE SKIN 3 (THREE) TIMES DAILY BEFORE MEALS. 10 mL 2  . Insulin Detemir (LEVEMIR FLEXTOUCH) 100 UNIT/ML Pen Inject under skin 25 units at bedtime (Patient taking differently: 28 Units. Inject under skin 25 units at bedtime) 45 mL 1  . levothyroxine (SYNTHROID, LEVOTHROID) 25 MCG tablet Take 1 tablet (25 mcg total) by mouth daily before breakfast. 90 tablet 3  . Magnesium Oxide 400 MG CAPS Take 1 capsule (400 mg total) by mouth 2 (two) times daily. 60 capsule 11  . metFORMIN (GLUCOPHAGE-XR) 500 MG 24 hr tablet Take 4 tablets (2,000 mg total) by mouth daily with breakfast. 360 tablet 3  . montelukast (SINGULAIR) 10 MG tablet TAKE 1 TABLET (10 MG TOTAL) BY MOUTH DAILY. 90 tablet 3  . Multiple Vitamins-Minerals (EYE VITAMINS PO) Take 1 tablet by mouth 2 (two) times daily.     . nitroGLYCERIN (NITROSTAT) 0.4 MG SL tablet Place 1 tablet (0.4 mg total) under the tongue every 5 (five) minutes as needed for chest pain. 25 tablet 1  . nizatidine (AXID) 150 MG capsule Take 1 capsule by mouth daily. Must keep 04/29/16 appt for future refills 30 capsule 0  . omeprazole (PRILOSEC) 20 MG capsule Take 1 capsule (20 mg total) by mouth daily. 90 capsule 3  . rosuvastatin (CRESTOR) 20 MG tablet Take 1 tablet (20 mg  total) by mouth daily. 90 tablet 3  . sacubitril-valsartan (ENTRESTO) 49-51 MG Take 1 tablet by mouth 2 (two) times daily. 60 tablet 3  . traMADol (ULTRAM) 50 MG tablet Take 1 tablet (50 mg total) by mouth every 6 (six) hours as needed (pain). 30 tablet 0  . Vitamin D, Ergocalciferol, (DRISDOL) 50000 units CAPS capsule Take 1 capsule (50,000 Units total) by mouth once a week. 12 capsule 3   No current facility-administered medications on file prior to visit.    Allergies  Allergen Reactions  . Darvon Other (See Comments)    indigestion  . Promethazine Hcl Other (See Comments)    hyperactivity  . Plavix [Clopidogrel Bisulfate] Rash   Family History  Problem Relation Age of Onset  . Diabetes Mother   . Heart disease Mother   . Hyperlipidemia Mother   . Hypertension Mother   . Heart disease Father   . Heart attack Father   . Hypertension Father   . Early death Brother 37  . Breast cancer Sister   . Lung cancer Sister   . Irritable bowel syndrome Sister   . Esophageal cancer Neg Hx   . Colon cancer Maternal Aunt   . Colon polyps Neg Hx     PE: BP 134/80   Pulse 67   Ht '5\' 3"'$  (1.6 m)   Wt 192 lb 6.4 oz (87.3 kg)   SpO2 97%   BMI 34.08 kg/m  Body mass index is 34.08 kg/m.  Wt Readings from Last 3 Encounters:  07/02/17 192 lb 6.4 oz (87.3 kg)  06/01/17 195 lb 8 oz (88.7 kg)  05/17/17 193 lb (87.5 kg)   Constitutional: overweight, in NAD Eyes: PERRLA, EOMI, no exophthalmos ENT: moist mucous membranes, no thyromegaly, no cervical lymphadenopathy Cardiovascular: RRR,+ SEM +1/6, no RG, + nonpitting LE edema Respiratory: CTA B Gastrointestinal: abdomen soft, NT, ND, BS+ Musculoskeletal: no deformities, strength intact in all 4 Skin: moist, warm, no rashes Neurological: no tremor  with outstretched hands, DTR normal in all 4  ASSESSMENT: 1. DM2, insulin-dependent, controlled, with complications - CAD, ICM - s/p ICD placement - CKD - PN - on neurontin - gastroparesis  per [facility]ES 06/18/2012 >> 60 minutes: 92%, 120 minutes: 82%  2. 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 HL  PLAN:  1. Patient with long-standing, uncontrolled, diabetes, on basal-bolus insulin regimen, returning after long absence.  When I last saw her 2 years ago her HbA1c was excellent, at 6.5%.  We were able to reduce the Levemir dose from 75 units daily to 25 units daily with improvement in her HbA1c.  However, afterwards, she was lost for follow-up, and latest HbA1c was 8.1% 3 months ago. - at this visit, she tells me she started to improve her diet >> now she is dropping her sugars after meals on the current Novolog dose, of 20 units before meals >> sugars occasionally in the 40s >> will decrease the dose and give her a more flexible regimen - dose depending on the size of the meal - will also move all Metformin at night to help with am sugars which are the highest of the day - will also combine the Levemir dose in 1 and give it at night - will change from vials of Novolog to pens - I suggested to: Patient Instructions  Please continue: - Metformin XR but move it with dinner (2000 mg)  Please change: - Levemir to 40 units at night - Novolog:  - 10 units before a small meal - 14 units before a large meal  Please return in 3 months with your sugar log.    - today, HbA1c is 7.2% (better)  - continue checking sugars at different times of the day - check 3x a day, rotating checks - advised for yearly eye exams >> she is UTD - Return to clinic in 3 mo with sugar log   2. PN - stable - continues Neurontin w/o SEs   3. HL -Reviewed latest lipid panel from 03/2017: LDL at goal - Continues Crestor without side effects  Philemon Kingdom, MD PhD [facility] Endocrinology

## 2017-07-06 ENCOUNTER — Ambulatory Visit: Payer: Medicare PPO | Admitting: Internal Medicine

## 2017-07-12 ENCOUNTER — Ambulatory Visit: Payer: Medicare PPO | Admitting: Internal Medicine

## 2017-07-12 ENCOUNTER — Encounter: Payer: Self-pay | Admitting: Internal Medicine

## 2017-07-12 VITALS — BP 120/86 | HR 76 | Temp 98.2°F | Ht 63.0 in | Wt 189.0 lb

## 2017-07-12 DIAGNOSIS — M25562 Pain in left knee: Secondary | ICD-10-CM | POA: Diagnosis not present

## 2017-07-12 DIAGNOSIS — E785 Hyperlipidemia, unspecified: Secondary | ICD-10-CM | POA: Diagnosis not present

## 2017-07-12 DIAGNOSIS — M25561 Pain in right knee: Secondary | ICD-10-CM | POA: Diagnosis not present

## 2017-07-12 DIAGNOSIS — G8929 Other chronic pain: Secondary | ICD-10-CM | POA: Diagnosis not present

## 2017-07-12 DIAGNOSIS — Z0000 Encounter for general adult medical examination without abnormal findings: Secondary | ICD-10-CM

## 2017-07-12 DIAGNOSIS — Z23 Encounter for immunization: Secondary | ICD-10-CM

## 2017-07-12 DIAGNOSIS — I10 Essential (primary) hypertension: Secondary | ICD-10-CM

## 2017-07-12 DIAGNOSIS — E1142 Type 2 diabetes mellitus with diabetic polyneuropathy: Secondary | ICD-10-CM | POA: Diagnosis not present

## 2017-07-12 MED ORDER — OMEPRAZOLE 20 MG PO CPDR: 20.0000 mg | DELAYED_RELEASE_CAPSULE | Freq: Every day | ORAL | 5 refills | Status: DC

## 2017-07-12 MED ORDER — TRAMADOL HCL 50 MG PO TABS: 50.0000 mg | ORAL_TABLET | Freq: Four times a day (QID) | ORAL | 2 refills | Status: DC | PRN

## 2017-07-12 MED ORDER — AMIODARONE HCL 100 MG PO TABS: 100.0000 mg | ORAL_TABLET | Freq: Every day | ORAL | 5 refills | Status: DC

## 2017-07-12 NOTE — Progress Notes (Signed)
Subjective:    Patient ID: [name], female    DOB: 10-01-1945, 72 y.o.   MRN: [id number]  HPI Here for wellness and f/u;  Overall doing ok;  Pt denies Chest pain, worsening SOB, DOE, wheezing, orthopnea, PND, worsening LE edema, palpitations, dizziness or syncope.  Pt denies neurological change such as new headache, facial or extremity weakness.  Pt denies polydipsia, polyuria, or low sugar symptoms. Pt states overall good compliance with treatment and medications, good tolerability, and has been trying to follow appropriate diet.  Pt denies worsening depressive symptoms, suicidal ideation or panic. No fever, night sweats, wt loss, loss of appetite, or other constitutional symptoms.  Pt states good ability with ADL's, has low fall risk, home safety reviewed and adequate, no other significant changes in hearing or vision, and only occasionally active with exercise.  Due for colonoscopy, but will need lovenox bridge.   Wt Readings from Last 3 Encounters:  07/12/17 189 lb (85.7 kg)  07/02/17 192 lb 6.4 oz (87.3 kg)  06/01/17 195 lb 8 oz (88.7 kg)  Does have chronic bilat knee pain, bone on bone, needs surgury bc cortisone and gel have not worked but too high risk.  Needs pain control for 8/10 pain.  No other interval change or new complaint Past Medical History:  Diagnosis Date  . Allergy   . Arthritis   . Atrial fibrillation ([place])    controlled with amiodarone, on coumadin  . Chronic renal insufficiency   . Chronic systolic heart failure ([place])   . Congestive heart failure ([place])   . Coronary artery disease 01/31/2011  . Diabetes mellitus    type 1  . 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   . History of chicken pox   . Hyperlipidemia   . Hypertension   . Ischemic cardiomyopathy   . Lumbar spondylosis 01/11/2012  . Stroke [facility]) 2010   eye doctor said she had TIA  . Ventricular tachycardia ([place])    Polymorphic   Past Surgical History:  Procedure Laterality Date  .  ABDOMINAL HYSTERECTOMY  1995  . CARDIAC DEFIBRILLATOR PLACEMENT    . CHOLECYSTECTOMY  1985  . ICD  2003/2007   implanted by Dr Rollene Fare, most recent generator change 2/13 by Dr Rayann Heman, Analyze ST study patient  . IMPLANTABLE CARDIOVERTER DEFIBRILLATOR (ICD) GENERATOR CHANGE N/A 05/05/2011   Procedure: ICD GENERATOR CHANGE;  Surgeon: Thompson Grayer, MD;  Location: [facility] CATH LAB;  Service: Cardiovascular;  Laterality: N/A;  . LEFT HEART CATHETERIZATION WITH CORONARY ANGIOGRAM N/A 01/30/2011   Procedure: LEFT HEART CATHETERIZATION WITH CORONARY ANGIOGRAM;  Surgeon: Larey Dresser, MD;  Location: [facility] CATH LAB;  Service: Cardiovascular;  Laterality: N/A;  . TUBALIGATION  1980    reports that she quit smoking about 21 years ago. She has never used smokeless tobacco. She reports that she does not drink alcohol or use drugs. family history includes Breast cancer in her sister; Colon cancer in her maternal aunt; Diabetes in her mother; Early death (age of onset: 53) in her brother; Heart attack in her father; Heart disease in her father and mother; Hyperlipidemia in her mother; Hypertension in her father and mother; Irritable bowel syndrome in her sister; Lung cancer in her sister. Allergies  Allergen Reactions  . Darvon Other (See Comments)    indigestion  . Promethazine Hcl Other (See Comments)    hyperactivity  . Plavix [Clopidogrel Bisulfate] Rash   Current Outpatient Medications on File Prior to Visit  Medication Sig Dispense Refill  .  apixaban (ELIQUIS) 5 MG TABS tablet Take 1 tablet (5 mg total) by mouth 2 (two) times daily. 60 tablet 11  . Blood Glucose Monitoring Suppl (ACCU-CHEK NANO SMARTVIEW) W/DEVICE KIT Use to test blood sugar 4 times daily as instructed. Dx code: E11.59 1 kit 0  . carvedilol (COREG) 25 MG tablet TAKE 1/2 TABLET TWICE DAILY WITH A MEAL 90 tablet 3  . Cholecalciferol (VITAMIN D3) 2000 UNITS TABS Take 2,000 Units by mouth daily.     [name] dicyclomine (BENTYL) 10 MG capsule TAKE  1 CAPSULE (10 MG TOTAL) BY MOUTH 4 (FOUR) TIMES DAILY BEFORE MEALS AND AT BEDTIME. 120 capsule 1  . furosemide (LASIX) 20 MG tablet Take 1 tablet (20 mg total) by mouth daily. 30 tablet 3  . gabapentin (NEURONTIN) 400 MG capsule TAKE 1 CAPSULE (400 MG TOTAL) BY MOUTH 2 (TWO) TIMES DAILY. 180 capsule 1  . glucose blood (ACCU-CHEK SMARTVIEW) test strip Use to test blood sugar 4 times daily as instructed. Dx code E11.59 200 each 11  . insulin aspart (NOVOLOG FLEXPEN) 100 UNIT/ML FlexPen Inject 10-14 Units into the skin 3 (three) times daily with meals. 45 mL 3  . Insulin Detemir (LEVEMIR FLEXTOUCH) 100 UNIT/ML Pen Inject 40 Units into the skin daily at 10 pm. 15 pen 3  . levothyroxine (SYNTHROID, LEVOTHROID) 25 MCG tablet Take 1 tablet (25 mcg total) by mouth daily before breakfast. 90 tablet 3  . Magnesium Oxide 400 MG CAPS Take 1 capsule (400 mg total) by mouth 2 (two) times daily. 60 capsule 11  . metFORMIN (GLUCOPHAGE-XR) 500 MG 24 hr tablet Take 4 tablets (2,000 mg total) by mouth daily with breakfast. 360 tablet 3  . Multiple Vitamins-Minerals (EYE VITAMINS PO) Take 1 tablet by mouth 2 (two) times daily.     . nitroGLYCERIN (NITROSTAT) 0.4 MG SL tablet Place 1 tablet (0.4 mg total) under the tongue every 5 (five) minutes as needed for chest pain. 25 tablet 1  . nizatidine (AXID) 150 MG capsule Take 1 capsule by mouth daily. Must keep 04/29/16 appt for future refills 30 capsule 0  . rosuvastatin (CRESTOR) 20 MG tablet Take 1 tablet (20 mg total) by mouth daily. 90 tablet 3  . sacubitril-valsartan (ENTRESTO) 49-51 MG Take 1 tablet by mouth 2 (two) times daily. 60 tablet 3   No current facility-administered medications on file prior to visit.    Review of Systems Constitutional: Negative for other unusual diaphoresis, sweats, appetite or weight changes HENT: Negative for other worsening hearing loss, ear pain, facial swelling, mouth sores or neck stiffness.   Eyes: Negative for other worsening  pain, redness or other visual disturbance.  Respiratory: Negative for other stridor or swelling Cardiovascular: Negative for other palpitations or other chest pain  Gastrointestinal: Negative for worsening diarrhea or loose stools, blood in stool, distention or other pain Genitourinary: Negative for hematuria, flank pain or other change in urine volume.  Musculoskeletal: Negative for myalgias or other joint swelling.  Skin: Negative for other color change, or other wound or worsening drainage.  Neurological: Negative for other syncope or numbness. Hematological: Negative for other adenopathy or swelling Psychiatric/Behavioral: Negative for hallucinations, other worsening agitation, SI, self-injury, or new decreased concentration All other system neg per pt    Objective:   Physical Exam BP 120/86   Pulse 76   Temp 98.2 F (36.8 C) (Oral)   Ht _0  (1.6 m)   Wt 189 lb (85.7 kg)   SpO2 96%   BMI 33.48  kg/m  VS noted,  Constitutional: Pt is oriented to person, place, and time. Appears well-developed and well-nourished, in no significant distress and comfortable Head: Normocephalic and atraumatic  Eyes: Conjunctivae and EOM are normal. Pupils are equal, round, and reactive to light Right Ear: External ear normal without discharge Left Ear: External ear normal without discharge Nose: Nose without discharge or deformity Mouth/Throat: Oropharynx is without other ulcerations and moist  Neck: Normal range of motion. Neck supple. No JVD present. No tracheal deviation present or significant neck LA or mass Cardiovascular: Normal rate, regular rhythm, normal heart sounds and intact distal pulses.   Pulmonary/Chest: WOB normal and breath sounds without rales or wheezing  Abdominal: Soft. Bowel sounds are normal. NT. No HSM  Musculoskeletal: Normal range of motion. Exhibits no edema except for bilat knees with marked degenerative changes, no effusions Lymphadenopathy: Has no other cervical  adenopathy.  Neurological: Pt is alert and oriented to person, place, and time. Pt has normal reflexes. No cranial nerve deficit. Motor grossly intact, Gait intact Skin: Skin is warm and dry. No rash noted or new ulcerations Psychiatric:  Has normal mood and affect. Behavior is normal without agitation No other exam findings Lab Results  Component Value Date   WBC 7.0 04/08/2017   HGB 12.9 04/08/2017   HCT 39.7 04/08/2017   PLT 221.0 04/08/2017   GLUCOSE 210 (H) 06/24/2017   CHOL 151 04/08/2017   TRIG 112.0 04/08/2017   HDL 58.30 04/08/2017   LDLCALC 71 04/08/2017   ALT <5 (L) 06/01/2017   AST 25 06/01/2017   NA 144 06/24/2017   K 4.2 06/24/2017   CL 111 06/24/2017   CREATININE 1.63 (H) 06/24/2017   BUN 33 (H) 06/24/2017   CO2 21 (L) 06/24/2017   TSH 6.03 (H) 04/08/2017   INR 1.61 04/27/2017   HGBA1C 7.2 07/02/2017   MICROALBUR 6.4 (H) 04/08/2017      Assessment & Plan:

## 2017-07-12 NOTE — Assessment & Plan Note (Signed)
BP Readings from Last 3 Encounters:  07/12/17 120/86  07/02/17 134/80  06/01/17 123/80  stable overall by history and exam, recent data reviewed with pt, and pt to continue medical treatment as before,  to f/u any worsening symptoms or concerns

## 2017-07-12 NOTE — Assessment & Plan Note (Signed)
Lab Results  Component Value Date   HGBA1C 7.2 07/02/2017  cont f/u with endo and current tx

## 2017-07-12 NOTE — Assessment & Plan Note (Signed)
For tramadol prn,  to f/u any worsening symptoms or concerns ?

## 2017-07-12 NOTE — Assessment & Plan Note (Signed)
Lab Results  Component Value Date   LDLCALC 71 04/08/2017  stable overall by history and exam, recent data reviewed with pt, and pt to continue medical treatment as before,  to f/u any worsening symptoms or concerns

## 2017-07-12 NOTE — Assessment & Plan Note (Signed)

## 2017-07-12 NOTE — Patient Instructions (Addendum)
You had the Prevnar 13 pneuonia shot today  You will be contacted regarding the referral for: colonoscopy  Please continue all other medications as before, and refills have been done if requested.  Please have the pharmacy call with any other refills you may need.  Please continue your efforts at being more active, low cholesterol diet, and weight control.  You are otherwise up to date with prevention measures today.  Please keep your appointments with your specialists as you may have planned  We can hold on further lab testing today  Please return in 1 year for your yearly visit, or sooner if needed, with Lab testing done 3-5 days before

## 2017-07-15 ENCOUNTER — Encounter: Payer: Self-pay | Admitting: Gastroenterology

## 2017-07-19 ENCOUNTER — Ambulatory Visit (INDEPENDENT_AMBULATORY_CARE_PROVIDER_SITE_OTHER): Payer: Medicare PPO | Admitting: *Deleted

## 2017-07-19 DIAGNOSIS — I472 Ventricular tachycardia, unspecified: Secondary | ICD-10-CM

## 2017-07-20 ENCOUNTER — Encounter: Payer: Self-pay | Admitting: Cardiology

## 2017-07-20 NOTE — Progress Notes (Signed)
Remote ICD transmission.   

## 2017-07-26 ENCOUNTER — Telehealth: Payer: Self-pay | Admitting: Internal Medicine

## 2017-07-26 NOTE — Telephone Encounter (Signed)
Copied from [place] [id number]. Topic: Quick Communication - Rx Refill/Question >> Jul 26, 2017  4:36 PM Arletha Grippe wrote: Medication: levothyroxine (SYNTHROID, LEVOTHROID) 25 MCG tablet Has the patient contacted their pharmacy? Yes.   (Agent: If no, request that the patient contact the pharmacy for the refill.) Preferred Pharmacy (with phone number or street name): [facility] [address] [place]  new pharmacy   Agent: Please be advised that RX refills may take up to 3 business days. We ask that you follow-up with your pharmacy.

## 2017-07-27 MED ORDER — LEVOTHYROXINE SODIUM 25 MCG PO TABS: 25.0000 ug | ORAL_TABLET | Freq: Every day | ORAL | 2 refills | Status: DC

## 2017-07-27 NOTE — Telephone Encounter (Signed)
Cancel refills w/[employer]. [facility].Marland KitchenJohny [name]

## 2017-07-27 NOTE — Telephone Encounter (Signed)
Pt request refill on levothyroxine 25 mcg tablet LR was 04/08/17 for #90 tablets with 3 refills.  Her last TSH was 6.03.   [facility]  (new pharmacy, was [facility] Delivery  Provider  Dr. Jenny Reichmann LOV  07/12/17 NOV  07/28/17 Please review at visit.

## 2017-08-02 ENCOUNTER — Encounter (HOSPITAL_COMMUNITY): Payer: Self-pay

## 2017-08-02 ENCOUNTER — Ambulatory Visit (HOSPITAL_COMMUNITY)
Admission: RE | Admit: 2017-08-02 | Discharge: 2017-08-02 | Disposition: A | Discharge status: Home / Self Care | Payer: Medicare PPO | Source: Ambulatory Visit | Attending: Cardiology | Admitting: Cardiology

## 2017-08-02 VITALS — BP 132/72 | HR 73 | Wt 190.8 lb

## 2017-08-02 DIAGNOSIS — I5022 Chronic systolic (congestive) heart failure: Secondary | ICD-10-CM | POA: Insufficient documentation

## 2017-08-02 DIAGNOSIS — I255 Ischemic cardiomyopathy: Secondary | ICD-10-CM | POA: Insufficient documentation

## 2017-08-02 DIAGNOSIS — Z8673 Personal history of transient ischemic attack (TIA), and cerebral infarction without residual deficits: Secondary | ICD-10-CM | POA: Insufficient documentation

## 2017-08-02 DIAGNOSIS — E039 Hypothyroidism, unspecified: Secondary | ICD-10-CM | POA: Insufficient documentation

## 2017-08-02 DIAGNOSIS — N189 Chronic kidney disease, unspecified: Secondary | ICD-10-CM | POA: Diagnosis not present

## 2017-08-02 DIAGNOSIS — I480 Paroxysmal atrial fibrillation: Secondary | ICD-10-CM | POA: Diagnosis not present

## 2017-08-02 DIAGNOSIS — I472 Ventricular tachycardia: Secondary | ICD-10-CM | POA: Insufficient documentation

## 2017-08-02 DIAGNOSIS — Z7984 Long term (current) use of oral hypoglycemic drugs: Secondary | ICD-10-CM | POA: Insufficient documentation

## 2017-08-02 DIAGNOSIS — E1122 Type 2 diabetes mellitus with diabetic chronic kidney disease: Secondary | ICD-10-CM | POA: Insufficient documentation

## 2017-08-02 DIAGNOSIS — I130 Hypertensive heart and chronic kidney disease with heart failure and stage 1 through stage 4 chronic kidney disease, or unspecified chronic kidney disease: Secondary | ICD-10-CM | POA: Diagnosis not present

## 2017-08-02 DIAGNOSIS — Z8679 Personal history of other diseases of the circulatory system: Secondary | ICD-10-CM

## 2017-08-02 DIAGNOSIS — N183 Chronic kidney disease, stage 3 unspecified: Secondary | ICD-10-CM

## 2017-08-02 DIAGNOSIS — K3184 Gastroparesis: Secondary | ICD-10-CM | POA: Insufficient documentation

## 2017-08-02 DIAGNOSIS — Z79899 Other long term (current) drug therapy: Secondary | ICD-10-CM | POA: Diagnosis not present

## 2017-08-02 DIAGNOSIS — I2510 Atherosclerotic heart disease of native coronary artery without angina pectoris: Secondary | ICD-10-CM | POA: Insufficient documentation

## 2017-08-02 LAB — BASIC METABOLIC PANEL
Anion gap: 7 (ref 5–15)
BUN: 26 mg/dL — ABNORMAL HIGH (ref 6–20)
CO2: 23 mmol/L (ref 22–32)
CREATININE: 1.54 mg/dL — AB (ref 0.44–1.00)
Calcium: 9.1 mg/dL (ref 8.9–10.3)
Chloride: 112 mmol/L — ABNORMAL HIGH (ref 101–111)
GFR calc non Af Amer: 33 mL/min — ABNORMAL LOW (ref >60)
GFR, EST AFRICAN AMERICAN: 38 mL/min — AB (ref >60)
Glucose, Bld: 89 mg/dL (ref 65–99)
Potassium: 4.7 mmol/L (ref 3.5–5.1)
Sodium: 142 mmol/L (ref 135–145)

## 2017-08-02 LAB — TSH: TSH: 4.468 u[IU]/mL (ref 0.350–4.500)

## 2017-08-02 LAB — T4, FREE: FREE T4: 1.13 ng/dL (ref 0.82–1.77)

## 2017-08-02 MED ORDER — SACUBITRIL-VALSARTAN 97-103 MG PO TABS: 1.0000 | ORAL_TABLET | Freq: Two times a day (BID) | ORAL | 11 refills | Status: DC

## 2017-08-02 MED ORDER — ROSUVASTATIN CALCIUM 20 MG PO TABS: 20.0000 mg | ORAL_TABLET | Freq: Every day | ORAL | 3 refills | Status: DC

## 2017-08-02 MED ORDER — CARVEDILOL 25 MG PO TABS: ORAL_TABLET | ORAL | 3 refills | Status: DC

## 2017-08-02 NOTE — Progress Notes (Addendum)
Patient ID: [name], female   DOB: [birth year], 72 y.o.   MRN: [id number] PCP: Dr Jenny Reichmann Cardiology: Dr. Aundra Dubin  72 y.o. with history of CAD, ischemic cardiomyopathy, and atrial fibrillation presents for cardiology followup.  Patient was hospitalized in 11/12 at [facility] for VT with ICD discharge.  She had a left heart cath showing patent stents.  EF was 35-40% by echo.  She is on amiodarone.  She has had periodic problems with creatinine rising with medication adjustments.  Most recent echo in 12/18 showed EF 35-40%, inferior and septal hypokinesis, mild MR.    She returns for HF follow up. Last visit lasix was restarted at 20 mg daily and Entresto increased to 49/51 mg BID. Overall, she is doing well. She is down 5 lbs since last visit. Her main complaint is fatigue. She denies SOB with walking, but activity is limited by knee pain. Denies orthopnea/PND. She has occasional LLE edema related to a previous surgery.  No CP or dizziness. No bleeding on Eliquis. She has a rare fluttering in right chest a few times/week. She has no associated symptoms and it only lasts a few seconds. She had some loose stools last week, but appetite has been okay. No fever or chills. She sees GI next month. Weights ~188 lbs at home. Compliant with medications.  Labs (10/12): LDL 73, HDL 44 Labs (11/12): K 3.9, creatinine 1.1, LFTs normal, TSH normal Labs (12/12): K 3.9, creatinine 1.5, proBNP 18 Labs (1/13): LDL 82, HDL 57 Labs (2/13): K 4.3, creatinine 1.5 Labs (5/13): creatinine 2.4 => 1.7, LFTs normal, TSH normal, proBNP 18 Labs (6/13): K 4.2, creatinine 1.7 Labs (10/13): K 4.3, creatinine 1.4 Labs (4/14); LFTs normal, TSH normal, LDL 71, HDL 58 Labs (5/14): K 4.5, creatinine 1.4 Labs (8/14): TSH normal, LFTs normal Labs (9/14): K 4.2, creatinine 1.8, LDL 75, HDL 54 Labs (07/31/13) K 3.7 Creatinine 1.8 BNP 148  Labs (9/15): K 4.3, creatinine 1.24, LFTs normal, LDL 81, HDL 58, TSH normal Labs (9/16): K  4.4, creatinine 1.44, LDL 64, HDL 54, LFTs normal, TSH normal, HCT 38 Labs (2/17): K 4.9, creatinine 1.76, HCT 38.6 Labs (1/19): LDL 71, HDL 58, K 4.8, creatinine 1.26, LFTs normal, hgb 12.9.  Labs (2/19): Creatinine 1.35, K 4.9 Las (4/19): Creatinine 1.63, K 4.2  PMH: 1. Diabetes mellitus 2. CVA, TIA in 2010 3. HTN 4. CKD stage 3 5. H/o TAH 6. H/o CCY 7. Sciatica 8. Atrial fibrillation 9. CAD: s/p LAD and RCA PCI.  Last LHC in 11/12 with patent proximal LAD stent, ostial 70% D1 (jailed by stent), mild LAD stent patent, patent RCA stents, EF 40% with global hypokinesis.  10. Ischemic cardiomyopathy: Echo (10/12): EF 35-40%, moderate focal basal septal hypertrophy, inferior akinesis, grade I diastolic dysfunction, moderate aortic insufficiency. St Jude dual chamber ICD.  Spironolactone stopped when creatinine rose to 2.5.  Echo (5/14) with EF 40-45%, mild AI.  Echo (6/16) with EF 40-45%, inferior/inferoseptal hypokinesis, mild LVH.  - Echo (12/18): EF 35-40%, inferior and septal hypokinesis, mild MR, mild AI.  11. Aortic insufficiency: moderate in past but mild on most recent echo.   12. 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  13. Gastroparesis 14. Hypothyroidism  SH: Divorced.  3 children.  Quit smoking in 1997. Lives in [place] now with daughter.  [place].   FH: CAD  Review of systems complete and found to be negative unless listed in HPI.   Current Outpatient Medications  Medication Sig Dispense Refill  .  amiodarone (PACERONE) 100 MG tablet Take 1 tablet (100 mg total) by mouth daily. 30 tablet 5  . apixaban (ELIQUIS) 5 MG TABS tablet Take 1 tablet (5 mg total) by mouth 2 (two) times daily. 60 tablet 11  . Blood Glucose Monitoring Suppl (ACCU-CHEK NANO SMARTVIEW) W/DEVICE KIT Use to test blood sugar 4 times daily as instructed. Dx code: E11.59 1 kit 0  . carvedilol (COREG) 25 MG tablet TAKE 1/2 TABLET TWICE DAILY WITH A MEAL 90 tablet 3  . Cholecalciferol (VITAMIN D3) 2000 UNITS  TABS Take 2,000 Units by mouth daily.     [name] dicyclomine (BENTYL) 10 MG capsule TAKE 1 CAPSULE (10 MG TOTAL) BY MOUTH 4 (FOUR) TIMES DAILY BEFORE MEALS AND AT BEDTIME. 120 capsule 1  . furosemide (LASIX) 20 MG tablet Take 1 tablet (20 mg total) by mouth daily. 30 tablet 3  . gabapentin (NEURONTIN) 400 MG capsule TAKE 1 CAPSULE (400 MG TOTAL) BY MOUTH 2 (TWO) TIMES DAILY. 180 capsule 1  . glucose blood (ACCU-CHEK SMARTVIEW) test strip Use to test blood sugar 4 times daily as instructed. Dx code E11.59 200 each 11  . insulin aspart (NOVOLOG FLEXPEN) 100 UNIT/ML FlexPen Inject 10-14 Units into the skin 3 (three) times daily with meals. 45 mL 3  . Insulin Detemir (LEVEMIR FLEXTOUCH) 100 UNIT/ML Pen Inject 40 Units into the skin daily at 10 pm. 15 pen 3  . levothyroxine (SYNTHROID, LEVOTHROID) 25 MCG tablet Take 1 tablet (25 mcg total) by mouth daily before breakfast. 90 tablet 2  . Magnesium Oxide 400 MG CAPS Take 1 capsule (400 mg total) by mouth 2 (two) times daily. 60 capsule 11  . metFORMIN (GLUCOPHAGE-XR) 500 MG 24 hr tablet Take 4 tablets (2,000 mg total) by mouth daily with breakfast. 360 tablet 3  . Multiple Vitamins-Minerals (EYE VITAMINS PO) Take 1 tablet by mouth 2 (two) times daily.     . nitroGLYCERIN (NITROSTAT) 0.4 MG SL tablet Place 1 tablet (0.4 mg total) under the tongue every 5 (five) minutes as needed for chest pain. 25 tablet 1  . nizatidine (AXID) 150 MG capsule Take 1 capsule by mouth daily. Must keep 04/29/16 appt for future refills 30 capsule 0  . omeprazole (PRILOSEC) 20 MG capsule Take 1 capsule (20 mg total) by mouth daily. 30 capsule 5  . rosuvastatin (CRESTOR) 20 MG tablet Take 1 tablet (20 mg total) by mouth daily. 90 tablet 3  . sacubitril-valsartan (ENTRESTO) 49-51 MG Take 1 tablet by mouth 2 (two) times daily. 60 tablet 3  . traMADol (ULTRAM) 50 MG tablet Take 1 tablet (50 mg total) by mouth every 6 (six) hours as needed (pain). 120 tablet 2   No current  facility-administered medications for this encounter.    Vitals:   08/02/17 1013  BP: 132/72  Pulse: 73  SpO2: 97%  Weight: 190 lb 12.8 oz (86.5 kg)   Wt Readings from Last 3 Encounters:  08/02/17 190 lb 12.8 oz (86.5 kg)  07/12/17 189 lb (85.7 kg)  07/02/17 192 lb 6.4 oz (87.3 kg)   General: Well appearing. No resp difficulty. HEENT: Normal Neck: Supple. JVP 6-7. Carotids 2+ bilat; no bruits. No thyromegaly or nodule noted. Cor: PMI nondisplaced. RRR, 2/6 RUSB Lungs: CTAB, normal effort. Abdomen: Soft, non-tender, non-distended, no HSM. No bruits or masses. +BS  Extremities: No cyanosis, clubbing, or rash. R and LLE no edema.  Neuro: Alert & orientedx3, cranial nerves grossly intact. moves all 4 extremities w/o difficulty. Affect pleasant  EKG: A-paced with prolonged AV conduction. Personally reviewed.   Assessment/Plan 1. Chronic systolic CHF: Ischemic cardiomyopathy NYHA class II symptoms. Volume stable on exam.  EF 35-40% in 12/18. She has a [employer], political party ICD - Increase Entresto 97/103 mg BID. BMET today and in 10 days.   - Continue Coreg 12.5 mg tid. Will not increase with fatigue.  - Continue lasix 20 mg daily - Consider spiro in future, but potassium has been >5.0 recently - Discussed importance of daily weights 2. Atrial fibrillation  - Paroxysmal, EKG shows A-pacing.  - Continue amiodarone 100 mg daily. Recent LFTs normal (05/2017). TSH elevated 03/2017. She will need regular eye exams.  - Will recheck TFTs today. Levoxyl is managed by her PCP.  - Continue Eliquis 5 mg bid. Denies bleeding.  3. Coronary artery disease   - No s/s ischemia. Nonobstructive disease on last cath. No ASA with Eliquis. - Continue Crestor 20 mg daily.  Good lipids in 1/19. 4. Ventricular tachycardia - On amiodarone 100 mg daily with suppression of VT. No change.   5. CKD: BMET today and in 10 days with medication changes 6. Hypothyroidism - TSH 6.0 03/2017. She was started on synthroid. Will  recheck TFTs today. - Managed by her PCP.     Increase Entresto to 97/103 mg BID BMET today and in 10 days. TFTs today. Follow up in 2-3 months with Dr Matilde Bash, NP 08/02/2017   Greater than 50% of the 25 minute visit was spent in counseling/coordination of care regarding disease state education, salt/fluid restriction, sliding scale diuretics, and medication compliance.

## 2017-08-02 NOTE — Patient Instructions (Signed)
Routine lab work today. Will notify you of abnormal results, otherwise no news is good news!  Return in 1-2 weeks for repeat labs.  Follow up 2-3 months with Dr. Aundra Dubin.  Take all medication as prescribed the day of your appointment. Bring all medications with you to your appointment.  Do the following things EVERYDAY: 1) Weigh yourself in the morning before breakfast. Write it down and keep it in a log. 2) Take your medicines as prescribed 3) Eat low salt foods-Limit salt (sodium) to 2000 mg per day.  4) Stay as active as you can everyday 5) Limit all fluids for the day to less than 2 liters

## 2017-08-02 NOTE — Addendum Note (Signed)
Encounter addended by: Georgiana Shore, NP on: 08/02/2017 10:59 AM  Actions taken: Problem List modified, LOS modified, Sign clinical note, Visit diagnoses modified

## 2017-08-03 LAB — T3, FREE: T3, Free: 2.3 pg/mL (ref 2.0–4.4)

## 2017-08-06 LAB — CUP PACEART REMOTE DEVICE CHECK
Battery Remaining Longevity: 31 mo
Battery Remaining Percentage: 31 %
Brady Statistic AP VP Percent: 2.1 %
Brady Statistic RA Percent Paced: 96 %
Brady Statistic RV Percent Paced: 2.1 %
Date Time Interrogation Session: 20190429161633
HighPow Impedance: 48 Ohm
Implantable Lead Implant Date: 20070117
Implantable Lead Location: 753859
Implantable Lead Location: 753860
Implantable Lead Model: 7040
Implantable Pulse Generator Implant Date: 20130212
Lead Channel Impedance Value: 540 Ohm
Lead Channel Pacing Threshold Amplitude: 0.75 V
Lead Channel Pacing Threshold Pulse Width: 0.5 ms
Lead Channel Sensing Intrinsic Amplitude: 11.8 mV
Lead Channel Sensing Intrinsic Amplitude: 4.7 mV
MDC IDC LEAD IMPLANT DT: 20070117
MDC IDC MSMT BATTERY VOLTAGE: 2.87 V
MDC IDC MSMT LEADCHNL RV IMPEDANCE VALUE: 600 Ohm
MDC IDC MSMT LEADCHNL RV PACING THRESHOLD AMPLITUDE: 1.25 V
MDC IDC MSMT LEADCHNL RV PACING THRESHOLD PULSEWIDTH: 0.5 ms
MDC IDC PG SERIAL: 819806
MDC IDC SET LEADCHNL RA PACING AMPLITUDE: 2 V
MDC IDC SET LEADCHNL RV PACING AMPLITUDE: 2.5 V
MDC IDC SET LEADCHNL RV PACING PULSEWIDTH: 0.5 ms
MDC IDC SET LEADCHNL RV SENSING SENSITIVITY: 0.5 mV
MDC IDC STAT BRADY AP VS PERCENT: 95 %
MDC IDC STAT BRADY AS VP PERCENT: 1 %
MDC IDC STAT BRADY AS VS PERCENT: 2.6 %

## 2017-08-20 ENCOUNTER — Ambulatory Visit (HOSPITAL_COMMUNITY)
Admission: RE | Admit: 2017-08-20 | Discharge: 2017-08-20 | Disposition: A | Discharge status: Home / Self Care | Payer: Medicare PPO | Source: Ambulatory Visit | Attending: Internal Medicine | Admitting: Internal Medicine

## 2017-08-20 ENCOUNTER — Telehealth (HOSPITAL_COMMUNITY): Payer: Self-pay | Admitting: Cardiology

## 2017-08-20 DIAGNOSIS — I5022 Chronic systolic (congestive) heart failure: Secondary | ICD-10-CM

## 2017-08-20 LAB — BASIC METABOLIC PANEL
Anion gap: 11 (ref 5–15)
BUN: 32 mg/dL — ABNORMAL HIGH (ref 6–20)
CALCIUM: 9 mg/dL (ref 8.9–10.3)
CO2: 22 mmol/L (ref 22–32)
CREATININE: 1.93 mg/dL — AB (ref 0.44–1.00)
Chloride: 111 mmol/L (ref 101–111)
GFR calc non Af Amer: 25 mL/min — ABNORMAL LOW (ref >60)
GFR, EST AFRICAN AMERICAN: 29 mL/min — AB (ref >60)
GLUCOSE: 113 mg/dL — AB (ref 65–99)
Potassium: 4.6 mmol/L (ref 3.5–5.1)
Sodium: 144 mmol/L (ref 135–145)

## 2017-08-20 NOTE — Telephone Encounter (Signed)
Notes recorded by Kerry Dory, CMA on 08/20/2017 at 12:42 PM EDT Patient aware. Patient voiced understanding   ------  Notes recorded by Georgiana Shore, NP on 08/20/2017 at 11:47 AM EDT Her creatinine is up a little bit. Please have her stop lasix and come for recheck BMET next week. Thanks.   Repeat labs 6/7

## 2017-08-20 NOTE — Telephone Encounter (Signed)
-----   Message from [name] Shore, NP sent at 08/20/2017 11:47 AM EDT ----- Her creatinine is up a little bit. Please have her stop lasix and come for recheck BMET next week. Thanks.

## 2017-08-27 ENCOUNTER — Ambulatory Visit (HOSPITAL_COMMUNITY)
Admission: RE | Admit: 2017-08-27 | Discharge: 2017-08-27 | Disposition: A | Discharge status: Home / Self Care | Payer: Medicare PPO | Source: Ambulatory Visit | Attending: Cardiology | Admitting: Cardiology

## 2017-08-27 DIAGNOSIS — I5022 Chronic systolic (congestive) heart failure: Secondary | ICD-10-CM | POA: Diagnosis not present

## 2017-08-27 LAB — BASIC METABOLIC PANEL
ANION GAP: 11 (ref 5–15)
BUN: 20 mg/dL (ref 6–20)
CALCIUM: 9 mg/dL (ref 8.9–10.3)
CO2: 23 mmol/L (ref 22–32)
Chloride: 109 mmol/L (ref 101–111)
Creatinine, Ser: 1.57 mg/dL — ABNORMAL HIGH (ref 0.44–1.00)
GFR calc Af Amer: 37 mL/min — ABNORMAL LOW (ref >60)
GFR calc non Af Amer: 32 mL/min — ABNORMAL LOW (ref >60)
GLUCOSE: 171 mg/dL — AB (ref 65–99)
Potassium: 4.4 mmol/L (ref 3.5–5.1)
Sodium: 143 mmol/L (ref 135–145)

## 2017-08-31 ENCOUNTER — Encounter: Payer: Self-pay | Admitting: Gastroenterology

## 2017-09-03 ENCOUNTER — Ambulatory Visit: Payer: Medicare PPO | Admitting: Gastroenterology

## 2017-10-05 ENCOUNTER — Ambulatory Visit: Payer: Medicare PPO | Admitting: Internal Medicine

## 2017-10-08 ENCOUNTER — Telehealth: Payer: Self-pay | Admitting: Internal Medicine

## 2017-10-08 NOTE — Telephone Encounter (Signed)
Copied from [place] [id number]. Topic: Quick Communication - Rx Refill/Question >> Oct 08, 2017 11:23 AM Burchel, Abbi R wrote: Medication: metFORMIN (GLUCOPHAGE-XR) 500 MG 24 hr tablet, Magnesium Oxide 400 MG CAPS   [id number] [place] [address] [address] Phone: [phone] Fax: [phone] Not a 24 hour pharmacy; exact hours not known.   Pt was advised that RX refills may take up to 3 business days. We ask that you follow-up with your pharmacy.

## 2017-10-11 ENCOUNTER — Other Ambulatory Visit: Payer: Self-pay | Admitting: *Deleted

## 2017-10-11 MED ORDER — METFORMIN HCL ER 500 MG PO TB24: 2000.0000 mg | ORAL_TABLET | Freq: Every day | ORAL | 3 refills | Status: DC

## 2017-10-11 MED ORDER — MAGNESIUM OXIDE 400 MG PO CAPS: 400.0000 mg | ORAL_CAPSULE | Freq: Two times a day (BID) | ORAL | 2 refills | Status: DC

## 2017-10-11 NOTE — Telephone Encounter (Signed)
Refill of magnesium by historical provider  [place] 04/29/17  #[phone]

## 2017-10-18 ENCOUNTER — Telehealth: Payer: Self-pay | Admitting: Cardiology

## 2017-10-18 ENCOUNTER — Ambulatory Visit (INDEPENDENT_AMBULATORY_CARE_PROVIDER_SITE_OTHER): Payer: Medicare PPO | Admitting: *Deleted

## 2017-10-18 DIAGNOSIS — I255 Ischemic cardiomyopathy: Secondary | ICD-10-CM

## 2017-10-18 NOTE — Telephone Encounter (Signed)
LMOVM reminding pt to send remote transmission.   

## 2017-10-19 ENCOUNTER — Encounter: Payer: Self-pay | Admitting: Cardiology

## 2017-10-19 NOTE — Progress Notes (Signed)
Remote ICD transmission.   

## 2017-10-23 ENCOUNTER — Other Ambulatory Visit (HOSPITAL_COMMUNITY): Payer: Self-pay | Admitting: Cardiology

## 2017-11-05 ENCOUNTER — Ambulatory Visit (HOSPITAL_COMMUNITY)
Admission: RE | Admit: 2017-11-05 | Discharge: 2017-11-05 | Disposition: A | Discharge status: Home / Self Care | Payer: Medicare PPO | Source: Ambulatory Visit | Attending: Cardiology | Admitting: Cardiology

## 2017-11-05 ENCOUNTER — Telehealth (HOSPITAL_COMMUNITY): Payer: Self-pay

## 2017-11-05 ENCOUNTER — Telehealth (HOSPITAL_COMMUNITY): Payer: Self-pay | Admitting: *Deleted

## 2017-11-05 VITALS — BP 155/62 | HR 60 | Wt 191.0 lb

## 2017-11-05 DIAGNOSIS — Z09 Encounter for follow-up examination after completed treatment for conditions other than malignant neoplasm: Secondary | ICD-10-CM | POA: Diagnosis not present

## 2017-11-05 DIAGNOSIS — Z8673 Personal history of transient ischemic attack (TIA), and cerebral infarction without residual deficits: Secondary | ICD-10-CM | POA: Diagnosis not present

## 2017-11-05 DIAGNOSIS — N183 Chronic kidney disease, stage 3 (moderate): Secondary | ICD-10-CM | POA: Insufficient documentation

## 2017-11-05 DIAGNOSIS — I130 Hypertensive heart and chronic kidney disease with heart failure and stage 1 through stage 4 chronic kidney disease, or unspecified chronic kidney disease: Secondary | ICD-10-CM | POA: Diagnosis not present

## 2017-11-05 DIAGNOSIS — I472 Ventricular tachycardia: Secondary | ICD-10-CM | POA: Insufficient documentation

## 2017-11-05 DIAGNOSIS — Z8249 Family history of ischemic heart disease and other diseases of the circulatory system: Secondary | ICD-10-CM | POA: Insufficient documentation

## 2017-11-05 DIAGNOSIS — I5022 Chronic systolic (congestive) heart failure: Secondary | ICD-10-CM | POA: Diagnosis not present

## 2017-11-05 DIAGNOSIS — Z79899 Other long term (current) drug therapy: Secondary | ICD-10-CM | POA: Diagnosis not present

## 2017-11-05 DIAGNOSIS — I4891 Unspecified atrial fibrillation: Secondary | ICD-10-CM | POA: Diagnosis not present

## 2017-11-05 DIAGNOSIS — I2510 Atherosclerotic heart disease of native coronary artery without angina pectoris: Secondary | ICD-10-CM | POA: Diagnosis not present

## 2017-11-05 DIAGNOSIS — E1122 Type 2 diabetes mellitus with diabetic chronic kidney disease: Secondary | ICD-10-CM | POA: Insufficient documentation

## 2017-11-05 DIAGNOSIS — Z7901 Long term (current) use of anticoagulants: Secondary | ICD-10-CM | POA: Insufficient documentation

## 2017-11-05 DIAGNOSIS — E039 Hypothyroidism, unspecified: Secondary | ICD-10-CM | POA: Insufficient documentation

## 2017-11-05 DIAGNOSIS — I255 Ischemic cardiomyopathy: Secondary | ICD-10-CM | POA: Insufficient documentation

## 2017-11-05 DIAGNOSIS — Z79890 Hormone replacement therapy (postmenopausal): Secondary | ICD-10-CM | POA: Insufficient documentation

## 2017-11-05 DIAGNOSIS — Z794 Long term (current) use of insulin: Secondary | ICD-10-CM | POA: Diagnosis not present

## 2017-11-05 LAB — CBC
HEMATOCRIT: 37.9 % (ref 36.0–46.0)
HEMOGLOBIN: 11.8 g/dL — AB (ref 12.0–15.0)
MCH: 30.6 pg (ref 26.0–34.0)
MCHC: 31.1 g/dL (ref 30.0–36.0)
MCV: 98.4 fL (ref 78.0–100.0)
PLATELETS: 215 10*3/uL (ref 150–400)
RBC: 3.85 MIL/uL — AB (ref 3.87–5.11)
RDW: 13.6 % (ref 11.5–15.5)
WBC: 5.5 10*3/uL (ref 4.0–10.5)

## 2017-11-05 LAB — COMPREHENSIVE METABOLIC PANEL
ALT: 11 U/L (ref 0–44)
AST: 15 U/L (ref 15–41)
Albumin: 3.5 g/dL (ref 3.5–5.0)
Alkaline Phosphatase: 63 U/L (ref 38–126)
Anion gap: 8 (ref 5–15)
BUN: 29 mg/dL — ABNORMAL HIGH (ref 8–23)
CHLORIDE: 112 mmol/L — AB (ref 98–111)
CO2: 22 mmol/L (ref 22–32)
CREATININE: 1.59 mg/dL — AB (ref 0.44–1.00)
Calcium: 8.9 mg/dL (ref 8.9–10.3)
GFR calc non Af Amer: 32 mL/min — ABNORMAL LOW (ref >60)
GFR, EST AFRICAN AMERICAN: 37 mL/min — AB (ref >60)
Glucose, Bld: 142 mg/dL — ABNORMAL HIGH (ref 70–99)
POTASSIUM: 5 mmol/L (ref 3.5–5.1)
SODIUM: 142 mmol/L (ref 135–145)
Total Bilirubin: 0.9 mg/dL (ref 0.3–1.2)
Total Protein: 6.2 g/dL — ABNORMAL LOW (ref 6.5–8.1)

## 2017-11-05 MED ORDER — SPIRONOLACTONE 25 MG PO TABS: 12.5000 mg | ORAL_TABLET | Freq: Every day | ORAL | 3 refills | Status: DC

## 2017-11-05 NOTE — Telephone Encounter (Signed)
Pt called and stated she was in the clinic today 08/16 and you put her on Spirolactone. Pt states that she was on it twice before and it gave her black colored diarrhea and make her extremely sick that she thought she had to go to ER. Pt voices concerns about taking the medicine. I advise her not to take it until I hear back from you. Is there something else you can put her on? Please advise.

## 2017-11-05 NOTE — Telephone Encounter (Signed)
Start eplerenone 25 mg daily instead.  Also want her to start either Lokelma or Veltassa with it because of elevated K. Have [place] arrange.

## 2017-11-05 NOTE — Patient Instructions (Signed)
Labs today (will call for abnormal results, otherwise no news is good news)  START taking Spironolactone 12.5 mg (0.5 Tablet) Once Daily.  Labs in 10 days (bmet)  Follow up in 4 months.

## 2017-11-05 NOTE — Telephone Encounter (Signed)
Result Notes for Comprehensive Metabolic Panel (CMET)   Notes recorded by Darron Doom, RN on 11/05/2017 at 2:58 PM EDT Spoke with patient and she stated that once she got home she remembered that she has tried spiro twice in the past and she is refusing to take it due to side effects of diarrhea and emesis. Will forward back to Dr. Aundra Dubin to see what he wants to do since patient will not start Arlyce Harman. MAR updated. ------  Notes recorded by Larey Dresser, MD on 11/05/2017 at 11:52 AM EDT She needs to take [facility] or Veltassa in addition to spironolactone. Doroteo Bradford, please help arrange. Will need repeat BMET in 1 week.

## 2017-11-06 NOTE — Progress Notes (Signed)
Patient ID: [name], female   DOB: May 29, 1945, 72 y.o.   MRN: [id number] PCP: Dr Jenny Reichmann Cardiology: Dr. Aundra Dubin  72 y.o. with history of CAD, ischemic cardiomyopathy, and atrial fibrillation presents for cardiology followup.  Patient was hospitalized in 11/12 at [facility] for VT with ICD discharge.  She had a left heart cath showing patent stents.  EF was 35-40% by echo.  She is on amiodarone.  She has had periodic problems with creatinine rising with medication adjustments.  Most recent echo in 12/18 showed EF 35-40%, inferior and septal hypokinesis, mild MR.    She returns today for followup of CHF.  She is stable clinically.  Most limited by bilateral knee pain, not very active because of this.  She is short of breath with stairs, ok on flat ground.  Chronically sleeps on 2 pillows.  No palpitations.  No lightheadedness.  No BRBPR/melena. She takes Lasix every other day or so. Weight stable.   ECG: a-paced, LVH (personally reviewed).   Labs (10/12): LDL 73, HDL 44 Labs (11/12): K 3.9, creatinine 1.1, LFTs normal, TSH normal Labs (12/12): K 3.9, creatinine 1.5, proBNP 18 Labs (1/13): LDL 82, HDL 57 Labs (2/13): K 4.3, creatinine 1.5 Labs (5/13): creatinine 2.4 => 1.7, LFTs normal, TSH normal, proBNP 18 Labs (6/13): K 4.2, creatinine 1.7 Labs (10/13): K 4.3, creatinine 1.4 Labs (4/14); LFTs normal, TSH normal, LDL 71, HDL 58 Labs (5/14): K 4.5, creatinine 1.4 Labs (8/14): TSH normal, LFTs normal Labs (9/14): K 4.2, creatinine 1.8, LDL 75, HDL 54 Labs (07/31/13) K 3.7 Creatinine 1.8 BNP 148  Labs (9/15): K 4.3, creatinine 1.24, LFTs normal, LDL 81, HDL 58, TSH normal Labs (9/16): K 4.4, creatinine 1.44, LDL 64, HDL 54, LFTs normal, TSH normal, HCT 38 Labs (2/17): K 4.9, creatinine 1.76, HCT 38.6 Labs (1/19): LDL 71, HDL 58, K 4.8, creatinine 1.26, LFTs normal, hgb 12.9.  Labs (2/19): Creatinine 1.35, K 4.9 Labs (6/19): K 4.4, creatinine 1.57  PMH: 1. Diabetes mellitus 2. CVA,  TIA in 2010 3. HTN 4. CKD stage 3 5. H/o TAH 6. H/o CCY 7. Sciatica 8. Atrial fibrillation 9. CAD: s/p LAD and RCA PCI.  Last LHC in 11/12 with patent proximal LAD stent, ostial 70% D1 (jailed by stent), mild LAD stent patent, patent RCA stents, EF 40% with global hypokinesis.  10. Ischemic cardiomyopathy: Echo (10/12): EF 35-40%, moderate focal basal septal hypertrophy, inferior akinesis, grade I diastolic dysfunction, moderate aortic insufficiency. St Jude dual chamber ICD.  Spironolactone stopped when creatinine rose to 2.5.  Echo (5/14) with EF 40-45%, mild AI.  Echo (6/16) with EF 40-45%, inferior/inferoseptal hypokinesis, mild LVH.  - Echo (12/18): EF 35-40%, inferior and septal hypokinesis, mild MR, mild AI.  11. Aortic insufficiency: moderate in past but mild on most recent echo.   12. 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  13. Gastroparesis 14. Hypothyroidism  SH: Divorced.  3 children.  Quit smoking in 1997. Lives in [place] now with daughter.  [place].   FH: CAD  ROS: All systems reviewed and negative except as per HPI.   Current Outpatient Medications  Medication Sig Dispense Refill  . amiodarone (PACERONE) 100 MG tablet Take 1 tablet (100 mg total) by mouth daily. 30 tablet 5  . apixaban (ELIQUIS) 5 MG TABS tablet Take 1 tablet (5 mg total) by mouth 2 (two) times daily. 60 tablet 11  . Blood Glucose Monitoring Suppl (ACCU-CHEK NANO SMARTVIEW) W/DEVICE KIT Use to test blood sugar 4  times daily as instructed. Dx code: E11.59 1 kit 0  . carvedilol (COREG) 25 MG tablet TAKE 1/2 TABLET TWICE DAILY WITH A MEAL 90 tablet 3  . Cholecalciferol (VITAMIN D3) 2000 UNITS TABS Take 2,000 Units by mouth daily.     [name] dicyclomine (BENTYL) 10 MG capsule TAKE 1 CAPSULE (10 MG TOTAL) BY MOUTH 4 (FOUR) TIMES DAILY BEFORE MEALS AND AT BEDTIME. 120 capsule 1  . furosemide (LASIX) 20 MG tablet Take 20 mg by mouth daily as needed for edema.    . gabapentin (NEURONTIN) 400 MG capsule TAKE 1 CAPSULE  (400 MG TOTAL) BY MOUTH 2 (TWO) TIMES DAILY. 180 capsule 1  . glucose blood (ACCU-CHEK SMARTVIEW) test strip Use to test blood sugar 4 times daily as instructed. Dx code E11.59 200 each 11  . insulin aspart (NOVOLOG FLEXPEN) 100 UNIT/ML FlexPen Inject 10-14 Units into the skin 3 (three) times daily with meals. 45 mL 3  . Insulin Detemir (LEVEMIR FLEXTOUCH) 100 UNIT/ML Pen Inject 40 Units into the skin daily at 10 pm. 15 pen 3  . levothyroxine (SYNTHROID, LEVOTHROID) 25 MCG tablet Take 1 tablet (25 mcg total) by mouth daily before breakfast. 90 tablet 2  . Magnesium Oxide 400 MG CAPS Take 1 capsule (400 mg total) by mouth 2 (two) times daily. 60 capsule 2  . metFORMIN (GLUCOPHAGE-XR) 500 MG 24 hr tablet Take 4 tablets (2,000 mg total) by mouth daily with breakfast. 360 tablet 3  . Multiple Vitamins-Minerals (EYE VITAMINS PO) Take 1 tablet by mouth 2 (two) times daily.     . nitroGLYCERIN (NITROSTAT) 0.4 MG SL tablet Place 1 tablet (0.4 mg total) under the tongue every 5 (five) minutes as needed for chest pain. 25 tablet 1  . nizatidine (AXID) 150 MG capsule Take 1 capsule by mouth daily. Must keep 04/29/16 appt for future refills 30 capsule 0  . omeprazole (PRILOSEC) 20 MG capsule Take 1 capsule (20 mg total) by mouth daily. 30 capsule 5  . rosuvastatin (CRESTOR) 20 MG tablet Take 1 tablet (20 mg total) by mouth daily. 90 tablet 3  . sacubitril-valsartan (ENTRESTO) 97-103 MG Take 1 tablet by mouth 2 (two) times daily. 60 tablet 11  . traMADol (ULTRAM) 50 MG tablet Take 1 tablet (50 mg total) by mouth every 6 (six) hours as needed (pain). 120 tablet 2   No current facility-administered medications for this encounter.    Filed Weights   11/05/17 1004  Weight: 86.6 kg (191 lb)    BP (!) 155/62 (BP Location: Right Arm, Patient Position: Sitting)   Pulse 60   Wt 86.6 kg (191 lb)   SpO2 97%   BMI 33.83 kg/m  General: NAD Neck: No JVD, no thyromegaly or thyroid nodule.  Lungs: Clear to  auscultation bilaterally with normal respiratory effort. CV: Nondisplaced PMI.  Heart regular S1/S2, no S3/S4, 2/6 early SEM RUSB.  No peripheral edema.  No carotid bruit.  Normal pedal pulses.  Abdomen: Soft, nontender, no hepatosplenomegaly, no distention.  Skin: Intact without lesions or rashes.  Neurologic: Alert and oriented x 3.  Psych: Normal affect. Extremities: No clubbing or cyanosis.  HEENT: Normal.   Assessment/Plan 1. Atrial fibrillation: A-paced today.  - Continue amiodarone 100 mg daily.  Check LFTs today. She is on Levoxyl followed by PCP. She will need regular eye exams.  - Continue Eliquis 5 mg bid. CBC today.   2. Coronary artery disease: No chest pain.  Nonobstructive disease on last cath.  - Continue  Crestor 20 mg daily.  Good lipids in 1/19.   3. Ventricular tachycardia - On amiodarone 100 mg daily with suppression of VT. 4. Chronic systolic CHF: Ischemic cardiomyopath: NYHA class II symptoms, she is not volume overloaded on exam.  EF 35-40% in 12/18. She has a [employer], political [employer] ICD.  - Continue Entresto 97/103 mg BID  - Continue Coreg 12.5 mg tid. - Continue to use Lasix as needed, tends to take qod.  - Did not tolerate spironolactone in the past due to side effects.  I will try her on eplerenone 25 mg daily. Will check K today, she has had high K in the past and may require Lokelma/Veltassa with eplerenone.  Repeat BMET in 10 days on eplerenone.  5. CKD stage 3: BMET today.   6. Hypothyroidism: Managed by PCP.   Loralie Champagne 11/06/2017

## 2017-11-08 ENCOUNTER — Telehealth (HOSPITAL_COMMUNITY): Payer: Self-pay | Admitting: *Deleted

## 2017-11-08 MED ORDER — EPLERENONE 25 MG PO TABS: 25.0000 mg | ORAL_TABLET | Freq: Every day | ORAL | 3 refills | Status: DC

## 2017-11-08 NOTE — Telephone Encounter (Signed)
Result Notes for Comprehensive Metabolic Panel (CMET)   Notes recorded by Darron Doom, RN on 11/08/2017 at 11:09 AM EDT After speaking with Ileene Patrick who advises to try Veltassa I called patient and she is to try eplerenone and veltassa. I advised her to not start eplerenone until she receives Veltassa in the mail. I cancelled her lab appt this Friday and asked for her to call us once she has both medications so that we can schedule lab appt for bmet and mag check. Veltassa forms mailed to paitent to sign and return, lab appt cancelled, and eplerenone sent to pharmacy. ------  Notes recorded by Larey Dresser, MD on 11/05/2017 at 4:06 PM EDT Let's have her take eplerenone 25 mg daily with either Lokelma or Veltassa. Have Erika facilitate. ------  Notes recorded by Darron Doom, RN on 11/05/2017 at 2:58 PM EDT Spoke with patient and she stated that once she got home she remembered that she has tried spiro twice in the past and she is refusing to take it due to side effects of diarrhea and emesis. Will forward back to Dr. Aundra Dubin to see what he wants to do since patient will not start Arlyce Harman. MAR updated. ------  Notes recorded by Larey Dresser, MD on 11/05/2017 at 11:52 AM EDT She needs to take [facility] or Veltassa in addition to spironolactone. Doroteo Bradford, please help arrange. Will need repeat BMET in 1 week.

## 2017-11-08 NOTE — Telephone Encounter (Signed)
Opened in error

## 2017-11-12 ENCOUNTER — Other Ambulatory Visit (HOSPITAL_COMMUNITY): Payer: Medicare PPO

## 2017-11-19 ENCOUNTER — Ambulatory Visit: Payer: Medicare PPO | Admitting: Gastroenterology

## 2017-11-19 ENCOUNTER — Ambulatory Visit: Payer: Medicare PPO | Admitting: Internal Medicine

## 2017-11-20 ENCOUNTER — Other Ambulatory Visit: Payer: Self-pay | Admitting: Internal Medicine

## 2017-11-20 LAB — CUP PACEART REMOTE DEVICE CHECK
Battery Remaining Longevity: 29 mo
Battery Remaining Percentage: 29 %
Battery Voltage: 2.86 V
Brady Statistic AP VP Percent: 2.4 %
Brady Statistic AP VS Percent: 95 %
Brady Statistic AS VP Percent: 1 %
Brady Statistic RA Percent Paced: 97 %
Date Time Interrogation Session: 20190730145058
HIGH POWER IMPEDANCE MEASURED VALUE: 44 Ohm
Implantable Lead Implant Date: 20070117
Implantable Lead Location: 753859
Lead Channel Impedance Value: 560 Ohm
Lead Channel Pacing Threshold Pulse Width: 0.5 ms
Lead Channel Sensing Intrinsic Amplitude: 4 mV
Lead Channel Setting Pacing Amplitude: 2 V
Lead Channel Setting Pacing Amplitude: 2.5 V
Lead Channel Setting Pacing Pulse Width: 0.5 ms
Lead Channel Setting Sensing Sensitivity: 0.5 mV
MDC IDC LEAD IMPLANT DT: 20070117
MDC IDC LEAD LOCATION: 753860
MDC IDC MSMT LEADCHNL RA IMPEDANCE VALUE: 440 Ohm
MDC IDC MSMT LEADCHNL RA PACING THRESHOLD AMPLITUDE: 0.75 V
MDC IDC MSMT LEADCHNL RV PACING THRESHOLD AMPLITUDE: 1.25 V
MDC IDC MSMT LEADCHNL RV PACING THRESHOLD PULSEWIDTH: 0.5 ms
MDC IDC MSMT LEADCHNL RV SENSING INTR AMPL: 11.8 mV
MDC IDC PG IMPLANT DT: 20130212
MDC IDC STAT BRADY AS VS PERCENT: 2 %
MDC IDC STAT BRADY RV PERCENT PACED: 2.5 %
Pulse Gen Serial Number: 819806

## 2017-11-21 ENCOUNTER — Other Ambulatory Visit: Payer: Self-pay | Admitting: Internal Medicine

## 2017-11-22 ENCOUNTER — Other Ambulatory Visit (HOSPITAL_COMMUNITY): Payer: Self-pay | Admitting: Cardiology

## 2017-12-09 ENCOUNTER — Telehealth: Payer: Self-pay | Admitting: Internal Medicine

## 2017-12-09 MED ORDER — DICYCLOMINE HCL 10 MG PO CAPS: ORAL_CAPSULE | ORAL | 1 refills | Status: DC

## 2017-12-09 NOTE — Telephone Encounter (Signed)
Copied from [place] [id number]. Topic: Quick Communication - Rx Refill/Question >> Dec 09, 2017  2:19 PM Sheran Luz wrote: Medication: dicyclomine (BENTYL) 10 MG capsule [[id number]]   Has the patient contacted their pharmacy? Yes. Was advised to call office.  Preferred Pharmacy (with phone number or street name): [id number] [place] [place] [address] [phone] (Phone) [phone] (Fax)

## 2017-12-09 NOTE — Telephone Encounter (Signed)
Bentyl refill Last Refill:04/30/17 # 120 with 1 refill Last OV: 07/12/17 PCP: Dr. Jenny Reichmann [facility] [address] [place].

## 2017-12-09 NOTE — Addendum Note (Signed)
Addended by: Biagio Borg on: 12/09/2017 03:14 PM   Modules accepted: Orders

## 2017-12-09 NOTE — Telephone Encounter (Signed)
Done erx 

## 2017-12-14 ENCOUNTER — Telehealth (HOSPITAL_COMMUNITY): Payer: Self-pay | Admitting: Pharmacist

## 2017-12-14 NOTE — Telephone Encounter (Signed)
Veltassa 8.4 gm PA approved by [facility] Part D through 03/22/18.   [name]. Velva Harman, PharmD, BCPS, CPP Clinical Pharmacist Phone: [phone] 12/14/2017 2:57 PM

## 2017-12-21 ENCOUNTER — Other Ambulatory Visit (HOSPITAL_COMMUNITY): Payer: Self-pay | Admitting: Pharmacist

## 2017-12-21 ENCOUNTER — Telehealth (HOSPITAL_COMMUNITY): Payer: Self-pay | Admitting: Pharmacist

## 2017-12-21 MED ORDER — VELTASSA 8.4 G PO PACK: 8.4000 g | PACK | Freq: Every day | ORAL | 11 refills | Status: DC

## 2017-12-21 NOTE — Telephone Encounter (Signed)
Veltassa [facility] faxed our office to tell us that they have been unable to reach [name] to send her the medication. I have called her and left a VM to call them back at [phone] to get started.   [name]. [name], PharmD, BCPS, CPP Clinical Pharmacist Phone: [phone] 12/21/2017 9:28 AM

## 2017-12-22 ENCOUNTER — Telehealth (HOSPITAL_COMMUNITY): Payer: Self-pay | Admitting: Pharmacist

## 2017-12-22 NOTE — Telephone Encounter (Signed)
Ms. Duba called stating that since starting Veltassa last Friday, she had severe stomach cramping and diarrhea. Since stopping Veltassa on Sunday, this has completely resolved. She also stopped taking the eplerenone since she was told to only take this while on Veltassa. Forwarding to Dr. Aundra Dubin for further recommendations.   Ruta Hinds. Velva Harman, PharmD, BCPS, CPP Clinical Pharmacist Phone: [phone] 12/22/2017 2:29 PM

## 2017-12-22 NOTE — Telephone Encounter (Signed)
Let's have her try Lokelma with eplerenone instead of Veltassa.

## 2017-12-23 NOTE — Telephone Encounter (Signed)
Called patient and left VM to call me back. We have plenty of Lokelma samples in clinic that we can get her started with to make sure she doesn't have any side effects with it.   Ruta Hinds. Velva Harman, PharmD, BCPS, CPP Clinical Pharmacist Phone: [phone] 12/23/2017 4:00 PM

## 2017-12-25 ENCOUNTER — Other Ambulatory Visit: Payer: Self-pay | Admitting: Internal Medicine

## 2018-01-17 ENCOUNTER — Ambulatory Visit (INDEPENDENT_AMBULATORY_CARE_PROVIDER_SITE_OTHER): Payer: Medicare PPO | Admitting: *Deleted

## 2018-01-17 DIAGNOSIS — I517 Cardiomegaly: Secondary | ICD-10-CM | POA: Diagnosis not present

## 2018-01-17 DIAGNOSIS — I472 Ventricular tachycardia, unspecified: Secondary | ICD-10-CM

## 2018-01-17 DIAGNOSIS — M1711 Unilateral primary osteoarthritis, right knee: Secondary | ICD-10-CM | POA: Diagnosis not present

## 2018-01-17 DIAGNOSIS — R079 Chest pain, unspecified: Secondary | ICD-10-CM | POA: Diagnosis not present

## 2018-01-17 DIAGNOSIS — S20212A Contusion of left front wall of thorax, initial encounter: Secondary | ICD-10-CM | POA: Diagnosis not present

## 2018-01-17 DIAGNOSIS — Z95810 Presence of automatic (implantable) cardiac defibrillator: Secondary | ICD-10-CM | POA: Diagnosis not present

## 2018-01-17 DIAGNOSIS — G8911 Acute pain due to trauma: Secondary | ICD-10-CM | POA: Diagnosis not present

## 2018-01-17 DIAGNOSIS — S8991XA Unspecified injury of right lower leg, initial encounter: Secondary | ICD-10-CM | POA: Diagnosis not present

## 2018-01-17 DIAGNOSIS — I255 Ischemic cardiomyopathy: Secondary | ICD-10-CM

## 2018-01-17 DIAGNOSIS — R51 Headache: Secondary | ICD-10-CM | POA: Diagnosis not present

## 2018-01-17 DIAGNOSIS — S8391XA Sprain of unspecified site of right knee, initial encounter: Secondary | ICD-10-CM | POA: Diagnosis not present

## 2018-01-17 MED ORDER — SODIUM CHLORIDE 0.9 % IV SOLN
10.00 | INTRAVENOUS | Status: DC
Start: ? — End: 2018-01-17

## 2018-01-17 MED ORDER — GENERIC EXTERNAL MEDICATION
10.00 | Status: DC
Start: ? — End: 2018-01-17

## 2018-01-17 NOTE — Progress Notes (Signed)
Remote ICD transmission.   

## 2018-01-18 ENCOUNTER — Other Ambulatory Visit: Payer: Self-pay | Admitting: Internal Medicine

## 2018-01-21 ENCOUNTER — Ambulatory Visit: Payer: Medicare PPO | Admitting: Gastroenterology

## 2018-01-21 ENCOUNTER — Telehealth: Payer: Self-pay | Admitting: Gastroenterology

## 2018-01-21 ENCOUNTER — Encounter: Payer: Self-pay | Admitting: Gastroenterology

## 2018-01-21 VITALS — BP 144/82 | HR 70 | Ht 63.0 in | Wt 192.5 lb

## 2018-01-21 DIAGNOSIS — Z1211 Encounter for screening for malignant neoplasm of colon: Secondary | ICD-10-CM | POA: Diagnosis not present

## 2018-01-21 MED ORDER — PEG 3350-KCL-NA BICARB-NACL 420 G PO SOLR: 4000.0000 mL | ORAL | 0 refills | Status: DC

## 2018-01-21 NOTE — Progress Notes (Signed)
Review of pertinent gastrointestinal problems: 1. Gastroparesis, documented by slow gastric emptying on March 2007 gastric emptying scan. Likely from longstanding diabetes. Esophagogastroduodenoscopy September 2007 essentially normal. 2. Chronic left lower quadrant pains. Unclear etiology, possiblyrelated to her longstanding gastroparesis. Abdominal ultrasound March 2007 showed nonspecific hepatomegaly, stent in her right renal system, otherwise essentially normal. CT scan with IV and oral contrast September 2007 was essentially normal except for some mild constipation. Colonoscopy October 2007 normal, next colonoscopy for CRC screening October 2017.  CT scan 07/2013 normal bowel, extrahepatic biliary dilation.    HPI: This is a very pleasant 72 yo woman  who was referred to me by Biagio Borg, MD  to evaluate  Colon cancer screening  Chief complaint is routine risk for colon cancer.  Also LLQ pains, chronic.    Her LLQ pains are the same as ever.  Pulling sensation.  Intermittent, followed by diarrhea.  Never bloody.  Overall weight has been stable.  She is on eliquis for ishcemic CM, was on coumadin previously   Echo 02/2017 LVEF 35-40%, known ischemic cardiomyopathy   Old Data Reviewed:     Review of systems: Pertinent positive and negative review of systems were noted in the above HPI section. All other review negative.   Past Medical History:  Diagnosis Date  . Allergy   . Arthritis   . Atrial fibrillation ([place])    controlled with amiodarone, on coumadin  . Chronic renal insufficiency   . Chronic systolic heart failure ([place])   . Congestive heart failure ([place])   . Coronary artery disease 01/31/2011  . Diabetes mellitus    type 1  . 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   . History of chicken pox   . Hyperlipidemia   . Hypertension   . Ischemic cardiomyopathy   . Lumbar spondylosis 01/11/2012  . Stroke [facility]) 2010   eye doctor said she had TIA  .  Ventricular tachycardia ([place])    Polymorphic    Past Surgical History:  Procedure Laterality Date  . ABDOMINAL HYSTERECTOMY  1995  . CARDIAC DEFIBRILLATOR PLACEMENT    . CHOLECYSTECTOMY  1985  . ICD  2003/2007   implanted by Dr Rollene Fare, most recent generator change 2/13 by Dr Rayann Heman, Analyze ST study patient  . IMPLANTABLE CARDIOVERTER DEFIBRILLATOR (ICD) GENERATOR CHANGE N/A 05/05/2011   Procedure: ICD GENERATOR CHANGE;  Surgeon: Thompson Grayer, MD;  Location: [facility] CATH LAB;  Service: Cardiovascular;  Laterality: N/A;  . LEFT HEART CATHETERIZATION WITH CORONARY ANGIOGRAM N/A 01/30/2011   Procedure: LEFT HEART CATHETERIZATION WITH CORONARY ANGIOGRAM;  Surgeon: Larey Dresser, MD;  Location: [facility] CATH LAB;  Service: Cardiovascular;  Laterality: N/A;  . TUBALIGATION  1980    Current Outpatient Medications  Medication Sig Dispense Refill  . amiodarone (PACERONE) 100 MG tablet TAKE 1 TABLET BY MOUTH EVERY DAY 30 tablet 5  . apixaban (ELIQUIS) 5 MG TABS tablet Take 1 tablet (5 mg total) by mouth 2 (two) times daily. 60 tablet 11  . Blood Glucose Monitoring Suppl (ACCU-CHEK NANO SMARTVIEW) W/DEVICE KIT Use to test blood sugar 4 times daily as instructed. Dx code: E11.59 1 kit 0  . carvedilol (COREG) 25 MG tablet TAKE 1/2 TABLET TWICE DAILY WITH A MEAL 90 tablet 3  . Cholecalciferol (VITAMIN D3) 2000 UNITS TABS Take 2,000 Units by mouth daily.     [name] dicyclomine (BENTYL) 10 MG capsule TAKE 1 CAPSULE (10 MG TOTAL) BY MOUTH 4 (FOUR) TIMES DAILY BEFORE MEALS AND AT BEDTIME. [place]  capsule 1  . ENTRESTO 24-26 MG TAKE 1 TABLET BY MOUTH TWICE A DAY 60 tablet 3  . furosemide (LASIX) 20 MG tablet Take 20 mg by mouth daily as needed for edema.    . gabapentin (NEURONTIN) 400 MG capsule TAKE 1 CAPSULE (400 MG TOTAL) BY MOUTH 2 (TWO) TIMES DAILY. 180 capsule 1  . glucose blood (ACCU-CHEK SMARTVIEW) test strip Use to test blood sugar 4 times daily as instructed. Dx code E11.59 200 each 11  . insulin aspart  (NOVOLOG FLEXPEN) 100 UNIT/ML FlexPen Inject 10-14 Units into the skin 3 (three) times daily with meals. 45 mL 3  . LEVEMIR FLEXTOUCH 100 UNIT/ML Pen INJECT 40 UNITS INTO THE SKIN DAILY AT 10 PM. 15 pen 3  . levothyroxine (SYNTHROID, LEVOTHROID) 25 MCG tablet Take 1 tablet (25 mcg total) by mouth daily before breakfast. 90 tablet 2  . magnesium oxide (MAG-OX) 400 MG tablet TAKE 1 TABLET (400 MG TOTAL) BY MOUTH 2 (TWO) TIMES DAILY. 60 tablet 2  . metFORMIN (GLUCOPHAGE-XR) 500 MG 24 hr tablet Take 4 tablets (2,000 mg total) by mouth daily with breakfast. 360 tablet 3  . Multiple Vitamins-Minerals (EYE VITAMINS PO) Take 1 tablet by mouth 2 (two) times daily.     . nitroGLYCERIN (NITROSTAT) 0.4 MG SL tablet Place 1 tablet (0.4 mg total) under the tongue every 5 (five) minutes as needed for chest pain. 25 tablet 1  . nizatidine (AXID) 150 MG capsule Take 1 capsule by mouth daily. Must keep 04/29/16 appt for future refills 30 capsule 0  . omeprazole (PRILOSEC) 20 MG capsule Take 1 capsule (20 mg total) by mouth daily. 30 capsule 5  . rosuvastatin (CRESTOR) 20 MG tablet Take 1 tablet (20 mg total) by mouth daily. 90 tablet 3  . sacubitril-valsartan (ENTRESTO) 97-103 MG Take 1 tablet by mouth 2 (two) times daily. 60 tablet 11  . traMADol (ULTRAM) 50 MG tablet Take 1 tablet (50 mg total) by mouth every 6 (six) hours as needed (pain). 120 tablet 2   No current facility-administered medications for this visit.     Allergies as of 01/21/2018 - Review Complete 01/21/2018  Allergen Reaction Noted  . Veltassa [patiromer] Diarrhea 12/22/2017  . Darvon Other (See Comments) 01/27/2011  . Promethazine hcl Other (See Comments) 01/27/2011  . Spironolactone Diarrhea and Nausea And Vomiting 11/05/2017  . Plavix [clopidogrel bisulfate] Rash 01/27/2011    Family History  Problem Relation Age of Onset  . Diabetes Mother   . Heart disease Mother   . Hyperlipidemia Mother   . Hypertension Mother   . Heart disease  Father   . Heart attack Father   . Hypertension Father   . Early death Brother 58  . Breast cancer Sister   . Lung cancer Sister   . Irritable bowel syndrome Sister   . Colon cancer Maternal Aunt   . Esophageal cancer Neg Hx   . Colon polyps Neg Hx     Social History   Socioeconomic History  . Marital status: Divorced    Spouse name: Not on file  . Number of children: 3  . Years of education: 11  . Highest education level: Not on file  Occupational History  . Occupation: Retried  Scientific laboratory technician  . Financial resource strain: Not on file  . Food insecurity:    Worry: Not on file    Inability: Not on file  . Transportation needs:    Medical: Not on file    Non-medical: Not  on file  Tobacco Use  . Smoking status: Former Smoker    Last attempt to quit: 08/16/1995    Years since quitting: 22.4  . Smokeless tobacco: Never Used  Substance and Sexual Activity  . Alcohol use: No  . Drug use: No  . Sexual activity: Never    Birth control/protection: Post-menopausal  Lifestyle  . Physical activity:    Days per week: Not on file    Minutes per session: Not on file  . Stress: Not on file  Relationships  . Social connections:    Talks on phone: Not on file    Gets together: Not on file    Attends religious service: Not on file    Active member of club or organization: Not on file    Attends meetings of clubs or organizations: Not on file    Relationship status: Not on file  . Intimate partner violence:    Fear of current or ex partner: Not on file    Emotionally abused: Not on file    Physically abused: Not on file    Forced sexual activity: Not on file  Other Topics Concern  . Not on file  Social History Narrative   Regular exercise-no   Caffeine Use-no     Physical Exam: BP (!) 144/82   Pulse 70   Ht '5\' 3"'$  (1.6 m)   Wt 192 lb 8 oz (87.3 kg)   BMI 34.10 kg/m  Constitutional: generally well-appearing Psychiatric: alert and oriented x3 Eyes: extraocular  movements intact Mouth: oral pharynx moist, no lesions Neck: supple no lymphadenopathy Cardiovascular: heart regular rate and rhythm Lungs: clear to auscultation bilaterally Abdomen: soft, nontender, nondistended, no obvious ascites, no peritoneal signs, normal bowel sounds Extremities: no lower extremity edema bilaterally Skin: no lesions on visible extremities   Assessment and plan: 72 y.o. female with  Routine risk for colon cancer, chronic LLQ pains  Her LLQ pains has been extensively worked up in the past, see summary above.  No dedicated workup for it now.  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  Planning for colon cancer screening colonoscopy. SHe has to hold her blood thinner for 2 days prior. Will communicate with her cardiologist about this recommendation. [facility] be done at [facility] given low EF.   Please see the "Patient Instructions" section for addition details about the plan.   Owens Loffler, MD [facility] 01/21/2018, 1:36 PM  Cc: Biagio Borg, MD

## 2018-01-21 NOTE — Telephone Encounter (Signed)
[facility] Pre-operative Risk Assessment     Request for surgical clearance:     Endoscopy Procedure  What type of surgery is being performed?     colonoscopy  When is this surgery scheduled?     02/24/18  What type of clearance is required ?   Pharmacy  Are there any medications that need to be held prior to surgery and how long? Eliquis  Practice name and name of physician performing surgery?      [facility]  What is your office phone and fax number?      Phone- [phone]  [phone]  Anesthesia type (None, local, MAC, general) ?       MAC

## 2018-01-21 NOTE — Patient Instructions (Addendum)
You will be set up for a colonoscopy (routine risk screening) at [facility] hospital given your cardiomyopathy. You will need to hold your eliquis for 2 days prior to the colonoscopy. We will communicate with your cardiology about the safety of that recommendation.  You have been scheduled for a colonoscopy. Please follow written instructions given to you at your visit today.  Please pick up your prep supplies at the pharmacy within the next 1-3 days. If you use inhalers (even only as needed), please bring them with you on the day of your procedure. Your physician has requested that you go to www.startemmi.com and enter the access code given to you at your visit today. This web site gives a general overview about your procedure. However, you should still follow specific instructions given to you by our office regarding your preparation for the procedure.  Thank you for entrusting me with your care and choosing [facility].  Dr Ardis Hughs

## 2018-01-21 NOTE — Telephone Encounter (Signed)
Pt takes Eliquis for afib with CHADS2VASc score of 8 (age, sex, HTN, CHF, DM, CAD, TIA). SCr 1.59, CrCl 65mL/min. Recommend only holding Eliquis for 24 hours prior to procedure due to elevated cardiac risk.

## 2018-01-24 NOTE — Telephone Encounter (Signed)
Yes, that should be OK.  Thanks

## 2018-01-24 NOTE — Telephone Encounter (Signed)
Patient notified to hold Eliquis 24 hours prior to her procedure per Cardiology. Patient verbalized understanding.

## 2018-01-25 ENCOUNTER — Telehealth: Payer: Self-pay

## 2018-01-25 NOTE — Telephone Encounter (Signed)
LM for pt to call back to see how she has been doing since last office visit. Asked to call per-op 1:30-5.

## 2018-01-25 NOTE — Telephone Encounter (Signed)
Spoke to patient to inform her that she may hold her Eliquis for 24 hours prior to 02/24/18 colonoscopy per [facility]. Patient voiced understanding

## 2018-01-25 NOTE — Telephone Encounter (Signed)
   Primary Cardiologist: Loralie Champagne, MD  Chart reviewed as part of pre-operative protocol coverage. Patient was contacted 01/25/2018 in reference to pre-operative risk assessment for pending surgery as outlined below.  [name] was last seen on 11/05/17 by Dr. Aundra Dubin. She has history of CAD, ischemic cardiomyopathy with EF 35-40%, ICD for hx of VT,  atrial fibrillation. Since that day, [name] has done well with no significant volume overload. She will need to be watched for volume overload surrounding the procedure. She usually takes lasix as needed which is about every other day.  She may need to take an Lasix post procedure if she is given much IV fluid.  Therefore, based on ACC/AHA guidelines, the patient would be at acceptable risk for the planned procedure without further cardiovascular testing.   According to our pharmacy protocol: Pt takes Eliquis for afib with CHADS2VASc score of 8 (age, sex, HTN, CHF, DM, CAD, TIA). SCr 1.59, CrCl 14mL/min. Recommend only holding Eliquis for 24 hours prior to procedure due to elevated cardiac risk.         I will route this recommendation to the requesting party via Epic fax function and remove from pre-op pool.  Please call with questions.  Daune Perch, NP 01/25/2018, 4:14 PM

## 2018-02-09 ENCOUNTER — Other Ambulatory Visit: Payer: Self-pay | Admitting: Internal Medicine

## 2018-02-12 ENCOUNTER — Other Ambulatory Visit: Payer: Self-pay | Admitting: Internal Medicine

## 2018-02-13 ENCOUNTER — Other Ambulatory Visit (HOSPITAL_COMMUNITY): Payer: Self-pay | Admitting: Cardiology

## 2018-02-23 ENCOUNTER — Encounter (HOSPITAL_COMMUNITY): Payer: Self-pay | Admitting: *Deleted

## 2018-02-23 NOTE — Anesthesia Preprocedure Evaluation (Addendum)
Anesthesia Evaluation  Patient identified by MRN, date of birth, ID band Patient awake    Reviewed: Allergy & Precautions, NPO status , Patient's Chart, lab work & pertinent test results, reviewed documented beta blocker date and time   History of Anesthesia Complications Negative for: history of anesthetic complications  Airway Mallampati: II  TM Distance: >3 FB Neck ROM: Full    Dental  (+) Dental Advisory Given, Missing   Pulmonary former smoker ([place]),    breath sounds clear to auscultation       Cardiovascular hypertension, Pt. on home beta blockers and Pt. on medications (-) angina+ CAD (left heart cath showing patent stents, non-obstructive, 11/19), +CHF and + Orthopnea  + dysrhythmias Atrial Fibrillation and Ventricular Tachycardia + pacemaker + Cardiac Defibrillator  Rhythm:Regular Rate:Normal  '18 ECHO: EF 35-40% with hypokinesis worse in the inferior and septal walls. Mild AI, mild MR   Neuro/Psych TIA   GI/Hepatic Neg liver ROS, GERD  Medicated and Controlled,  Endo/Other  diabetes, Insulin Dependent, Oral Hypoglycemic AgentsHypothyroidism Morbid obesity  Renal/GU Renal InsufficiencyRenal disease     Musculoskeletal   Abdominal (+) + obese,   Peds  Hematology eliquis   Anesthesia Other Findings   Reproductive/Obstetrics                            Anesthesia Physical Anesthesia Plan  ASA: III  Anesthesia Plan: MAC   Post-op Pain Management:    Induction:   PONV Risk Score and Plan: 2 and Treatment may vary due to age or medical condition  Airway Management Planned: Natural Airway and Nasal Cannula  Additional Equipment:   Intra-op Plan:   Post-operative Plan:   Informed Consent: I have reviewed the patients History and Physical, chart, labs and discussed the procedure including the risks, benefits and alternatives for the proposed anesthesia with the patient or  authorized representative who has indicated his/her understanding and acceptance.   Dental advisory given  Plan Discussed with: CRNA and Surgeon  Anesthesia Plan Comments: (Plan routine monitors, MAC)        Anesthesia Quick Evaluation

## 2018-02-24 ENCOUNTER — Encounter (HOSPITAL_COMMUNITY): Payer: Self-pay

## 2018-02-24 ENCOUNTER — Ambulatory Visit (HOSPITAL_COMMUNITY): Payer: Medicare PPO | Admitting: Anesthesiology

## 2018-02-24 ENCOUNTER — Ambulatory Visit (HOSPITAL_COMMUNITY)
Admission: RE | Admit: 2018-02-24 | Discharge: 2018-02-24 | Disposition: A | Discharge status: Home / Self Care | Payer: Medicare PPO | Source: Ambulatory Visit | Attending: Gastroenterology | Admitting: Gastroenterology

## 2018-02-24 ENCOUNTER — Encounter (HOSPITAL_COMMUNITY)
Admission: RE | Disposition: A | Discharge status: Home / Self Care | Payer: Self-pay | Source: Ambulatory Visit | Attending: Gastroenterology

## 2018-02-24 ENCOUNTER — Other Ambulatory Visit: Payer: Self-pay

## 2018-02-24 DIAGNOSIS — Z1211 Encounter for screening for malignant neoplasm of colon: Secondary | ICD-10-CM | POA: Insufficient documentation

## 2018-02-24 DIAGNOSIS — D122 Benign neoplasm of ascending colon: Secondary | ICD-10-CM | POA: Diagnosis not present

## 2018-02-24 DIAGNOSIS — G4730 Sleep apnea, unspecified: Secondary | ICD-10-CM | POA: Diagnosis not present

## 2018-02-24 DIAGNOSIS — I5022 Chronic systolic (congestive) heart failure: Secondary | ICD-10-CM | POA: Diagnosis not present

## 2018-02-24 DIAGNOSIS — E1022 Type 1 diabetes mellitus with diabetic chronic kidney disease: Secondary | ICD-10-CM | POA: Insufficient documentation

## 2018-02-24 DIAGNOSIS — Z8673 Personal history of transient ischemic attack (TIA), and cerebral infarction without residual deficits: Secondary | ICD-10-CM | POA: Diagnosis not present

## 2018-02-24 DIAGNOSIS — Z87891 Personal history of nicotine dependence: Secondary | ICD-10-CM | POA: Diagnosis not present

## 2018-02-24 DIAGNOSIS — I472 Ventricular tachycardia: Secondary | ICD-10-CM | POA: Insufficient documentation

## 2018-02-24 DIAGNOSIS — K219 Gastro-esophageal reflux disease without esophagitis: Secondary | ICD-10-CM | POA: Diagnosis not present

## 2018-02-24 DIAGNOSIS — K635 Polyp of colon: Secondary | ICD-10-CM

## 2018-02-24 DIAGNOSIS — Z794 Long term (current) use of insulin: Secondary | ICD-10-CM | POA: Insufficient documentation

## 2018-02-24 DIAGNOSIS — Z95810 Presence of automatic (implantable) cardiac defibrillator: Secondary | ICD-10-CM | POA: Insufficient documentation

## 2018-02-24 DIAGNOSIS — I255 Ischemic cardiomyopathy: Secondary | ICD-10-CM | POA: Insufficient documentation

## 2018-02-24 DIAGNOSIS — Z6833 Body mass index (BMI) 33.0-33.9, adult: Secondary | ICD-10-CM | POA: Insufficient documentation

## 2018-02-24 DIAGNOSIS — I2510 Atherosclerotic heart disease of native coronary artery without angina pectoris: Secondary | ICD-10-CM | POA: Insufficient documentation

## 2018-02-24 DIAGNOSIS — I4891 Unspecified atrial fibrillation: Secondary | ICD-10-CM | POA: Diagnosis not present

## 2018-02-24 DIAGNOSIS — N189 Chronic kidney disease, unspecified: Secondary | ICD-10-CM | POA: Diagnosis not present

## 2018-02-24 DIAGNOSIS — I130 Hypertensive heart and chronic kidney disease with heart failure and stage 1 through stage 4 chronic kidney disease, or unspecified chronic kidney disease: Secondary | ICD-10-CM | POA: Insufficient documentation

## 2018-02-24 DIAGNOSIS — I110 Hypertensive heart disease with heart failure: Secondary | ICD-10-CM | POA: Diagnosis not present

## 2018-02-24 HISTORY — PX: POLYPECTOMY: SHX5525

## 2018-02-24 HISTORY — DX: Sleep apnea, unspecified: G47.30

## 2018-02-24 HISTORY — PX: COLONOSCOPY WITH PROPOFOL: SHX5780

## 2018-02-24 LAB — GLUCOSE, CAPILLARY: Glucose-Capillary: 105 mg/dL — ABNORMAL HIGH (ref 70–99)

## 2018-02-24 SURGERY — COLONOSCOPY WITH PROPOFOL
Anesthesia: Monitor Anesthesia Care

## 2018-02-24 MED ORDER — MEPERIDINE HCL 25 MG/ML IJ SOLN: 6.2500 mg | INTRAMUSCULAR | Status: DC | PRN

## 2018-02-24 MED ORDER — SODIUM CHLORIDE 0.9 % IV SOLN
INTRAVENOUS | Status: DC
  Administered 2018-02-24: 07:00:00 via INTRAVENOUS

## 2018-02-24 MED ORDER — MIDAZOLAM HCL 5 MG/5ML IJ SOLN
INTRAMUSCULAR | Status: DC | PRN
  Administered 2018-02-24: 1 mg via INTRAVENOUS

## 2018-02-24 MED ORDER — MIDAZOLAM HCL 2 MG/2ML IJ SOLN: 0.5000 mg | Freq: Once | INTRAMUSCULAR | Status: DC | PRN

## 2018-02-24 MED ORDER — ETOMIDATE 2 MG/ML IV SOLN
INTRAVENOUS | Status: DC | PRN
  Administered 2018-02-24: 8 mg via INTRAVENOUS
  Administered 2018-02-24 (×2): 6 mg via INTRAVENOUS

## 2018-02-24 MED ORDER — PROPOFOL 10 MG/ML IV BOLUS
INTRAVENOUS | Status: AC
  Filled 2018-02-24: qty 40

## 2018-02-24 MED ORDER — MIDAZOLAM HCL 2 MG/2ML IJ SOLN
INTRAMUSCULAR | Status: AC
  Filled 2018-02-24: qty 2

## 2018-02-24 SURGICAL SUPPLY — 22 items

## 2018-02-24 NOTE — Discharge Instructions (Signed)
YOU HAD AN ENDOSCOPIC PROCEDURE TODAY: Refer to the procedure report and other information in the discharge instructions given to you for any specific questions about what was found during the examination. If this information does not answer your questions, please call [facility] office at [phone] to clarify.  ° °YOU SHOULD EXPECT: Some feelings of bloating in the abdomen. Passage of more gas than usual. Walking can help get rid of the air that was put into your GI tract during the procedure and reduce the bloating. If you had a lower endoscopy (such as a colonoscopy or flexible sigmoidoscopy) you may notice spotting of blood in your stool or on the toilet paper. Some abdominal soreness may be present for a day or two, also. ° °DIET: Your first meal following the procedure should be a light meal and then it is ok to progress to your normal diet. A half-sandwich or bowl of soup is an example of a good first meal. Heavy or fried foods are harder to digest and may make you feel nauseous or bloated. Drink plenty of fluids but you should avoid alcoholic beverages for 24 hours. If you had a esophageal dilation, please see attached instructions for diet.   ° °ACTIVITY: Your care partner should take you home directly after the procedure. You should plan to take it easy, moving slowly for the rest of the day. You can resume normal activity the day after the procedure however YOU SHOULD NOT DRIVE, use power tools, machinery or perform tasks that involve climbing or major physical exertion for 24 hours (because of the sedation medicines used during the test).  ° °SYMPTOMS TO REPORT IMMEDIATELY: °A gastroenterologist can be reached at any hour. Please call [phone]  for any of the following symptoms:  °Following lower endoscopy (colonoscopy, flexible sigmoidoscopy) °Excessive amounts of blood in the stool  °Significant tenderness, worsening of abdominal pains  °Swelling of the abdomen that is new, acute  °Fever of 100° or  higher  °Following upper endoscopy (EGD, EUS, ERCP, esophageal dilation) °Vomiting of blood or coffee ground material  °New, significant abdominal pain  °New, significant chest pain or pain under the shoulder blades  °Painful or persistently difficult swallowing  °New shortness of breath  °Black, tarry-looking or red, bloody stools ° °FOLLOW UP:  °If any biopsies were taken you will be contacted by phone or by letter within the next 1-3 weeks. Call [phone]  if you have not heard about the biopsies in 3 weeks.  °Please also call with any specific questions about appointments or follow up tests. ° °

## 2018-02-24 NOTE — Anesthesia Postprocedure Evaluation (Signed)
Anesthesia Post Note  Patient: [name]  Procedure(s) Performed: COLONOSCOPY WITH PROPOFOL (N/A ) POLYPECTOMY     Patient location during evaluation: Endoscopy Anesthesia Type: MAC Level of consciousness: oriented, awake and alert and patient cooperative Pain management: pain level controlled Vital Signs Assessment: post-procedure vital signs reviewed and stable Respiratory status: spontaneous breathing, nonlabored ventilation and respiratory function stable Cardiovascular status: blood pressure returned to baseline and stable Postop Assessment: no apparent nausea or vomiting Anesthetic complications: no    Last Vitals:  Vitals:   02/24/18 0812 02/24/18 0820  BP: (!) 179/56 (!) 180/69  Pulse: 61 63  Resp: 16 16  Temp:    SpO2: 100% 99%    Last Pain:  Vitals:   02/24/18 0820  TempSrc:   PainSc: 0-No pain                 [name],E. Cathi Hazan

## 2018-02-24 NOTE — Transfer of Care (Signed)
Immediate Anesthesia Transfer of Care Note  Patient: [name]  Procedure(s) Performed: COLONOSCOPY WITH PROPOFOL (N/A ) POLYPECTOMY  Patient Location: PACU  Anesthesia Type:MAC  Level of Consciousness: awake, alert , oriented and patient cooperative  Airway & Oxygen Therapy: Patient Spontanous Breathing and Patient connected to face mask oxygen  Post-op Assessment: Report given to RN, Post -op Vital signs reviewed and stable and Patient moving all extremities X 4  Post vital signs: stable  Last Vitals:  Vitals Value Taken Time  BP 183/68 02/24/2018  7:56 AM  Temp    Pulse 60 02/24/2018  7:58 AM  Resp 14 02/24/2018  7:58 AM  SpO2 100 % 02/24/2018  7:58 AM  Vitals shown include unvalidated device data.  Last Pain:  Vitals:   02/24/18 0650  TempSrc: Oral  PainSc: 0-No pain         Complications: No apparent anesthesia complications

## 2018-02-24 NOTE — Op Note (Signed)
[facility] Patient Name: [name] Procedure Date: 02/24/2018 MRN: [id number] Attending MD: Milus Banister , MD Date of Birth: [birth year] CSN: [id number] Age: 72 Admit Type: Outpatient Procedure:                Colonoscopy Indications:              Screening for colorectal malignant neoplasm Providers:                Milus Banister, MD, Cleda Daub, RN, Charolette Child, Technician, Enrigue Catena, CRNA Referring MD:              Medicines:                Monitored Anesthesia Care Complications:            No immediate complications. Estimated blood loss:                            None. Estimated Blood Loss:     Estimated blood loss: none. Procedure:                Pre-Anesthesia Assessment:                           - Prior to the procedure, a History and Physical                            was performed, and patient medications and                            allergies were reviewed. The patient's tolerance of                            previous anesthesia was also reviewed. The risks                            and benefits of the procedure and the sedation                            options and risks were discussed with the patient.                            All questions were answered, and informed consent                            was obtained. Prior Anticoagulants: The patient has                            taken Eliquis (apixaban), last dose was 2 days                            prior to procedure. ASA Grade Assessment: IV - A  patient with severe systemic disease that is a                            constant threat to life. After reviewing the risks                            and benefits, the patient was deemed in                            satisfactory condition to undergo the procedure.                           After obtaining informed consent, the colonoscope                            was  passed under direct vision. Throughout the                            procedure, the patient's blood pressure, pulse, and                            oxygen saturations were monitored continuously. The                            CF-HQ190L ([id number]) Olympus adult colonoscope was                            introduced through the anus and advanced to the the                            cecum, identified by appendiceal orifice and                            ileocecal valve. The colonoscopy was performed                            without difficulty. The patient tolerated the                            procedure well. The quality of the bowel                            preparation was good. The ileocecal valve,                            appendiceal orifice, and rectum were photographed. Scope In: 7:33:19 AM Scope Out: 7:48:52 AM Scope Withdrawal Time: 0 hours 9 minutes 30 seconds  Total Procedure Duration: 0 hours 15 minutes 33 seconds  Findings:      Three sessile polyps were found in the ascending colon. The polyps were       1 to 3 mm in size. These polyps were removed with a cold snare.       Resection and retrieval were complete.      The exam was otherwise without abnormality  on direct and retroflexion       views. Impression:               - Three 1 to 3 mm polyps in the ascending colon,                            removed with a cold snare. Resected and retrieved.                           - The examination was otherwise normal on direct                            and retroflexion views. Moderate Sedation:      Not Applicable - Patient had care per Anesthesia. Recommendation:           - Patient has a contact number available for                            emergencies. The signs and symptoms of potential                            delayed complications were discussed with the                            patient. Return to normal activities tomorrow.                            Written  discharge instructions were provided to the                            patient.                           - Resume previous diet.                           - Continue present medications. OK to resume your                            blood thinner today.                           You will receive a letter within 2-3 weeks with the                            pathology results and my final recommendations.                           If the polyp(s) is proven to be 'pre-cancerous' on                            pathology, you will need repeat colonoscopy in 3-5                            years. If the polyp(s) is NOT 'precancerous'  on                            pathology then you should repeat colon cancer                            screening in 10 years with colonoscopy without need                            for colon cancer screening by any method prior to                            then (including stool testing). Procedure Code(s):        --- Professional ---                           [phone], Colonoscopy, flexible; with removal of                            tumor(s), polyp(s), or other lesion(s) by snare                            technique Diagnosis Code(s):        --- Professional ---                           Z12.11, Encounter for screening for malignant                            neoplasm of colon                           D12.2, Benign neoplasm of ascending colon CPT copyright 2018 American Medical Association. All rights reserved. The codes documented in this report are preliminary and upon coder review may  be revised to meet current compliance requirements. Milus Banister, MD 02/24/2018 7:51:52 AM This report has been signed electronically. Number of Addenda: 0

## 2018-02-24 NOTE — Anesthesia Procedure Notes (Signed)
Procedure Name: MAC Date/Time: 02/24/2018 7:27 AM Performed by: Lissa Morales, CRNA Pre-anesthesia Checklist: Patient identified, Emergency Drugs available, Suction available, Patient being monitored and Timeout performed Patient Re-evaluated:Patient Re-evaluated prior to induction Oxygen Delivery Method: Simple face mask Placement Confirmation: positive ETCO2

## 2018-02-24 NOTE — H&P (Signed)
HPI: This is a 72 yo woman  Chief complaint is routine risk for colon cancer  ROS: complete GI ROS as described in HPI, all other review negative.  Constitutional:  No unintentional weight loss   Past Medical History:  Diagnosis Date  . Allergy   . Arthritis   . Atrial fibrillation ([place])    controlled with amiodarone, on coumadin  . Chronic renal insufficiency   . Chronic systolic heart failure ([place])   . Congestive heart failure ([place])   . Coronary artery disease 01/31/2011  . Diabetes mellitus    type 1  . 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   . History of chicken pox   . Hyperlipidemia   . Hypertension   . Ischemic cardiomyopathy   . Lumbar spondylosis 01/11/2012  . Sleep apnea   . Stroke [facility]) 2010   eye doctor said she had TIA  . Ventricular tachycardia ([place])    Polymorphic    Past Surgical History:  Procedure Laterality Date  . ABDOMINAL HYSTERECTOMY  1995  . CARDIAC DEFIBRILLATOR PLACEMENT    . CHOLECYSTECTOMY  1985  . ICD  2003/2007   implanted by Dr Rollene Fare, most recent generator change 2/13 by Dr Rayann Heman, Analyze ST study patient  . IMPLANTABLE CARDIOVERTER DEFIBRILLATOR (ICD) GENERATOR CHANGE N/A 05/05/2011   Procedure: ICD GENERATOR CHANGE;  Surgeon: Thompson Grayer, MD;  Location: [facility] CATH LAB;  Service: Cardiovascular;  Laterality: N/A;  . LEFT HEART CATHETERIZATION WITH CORONARY ANGIOGRAM N/A 01/30/2011   Procedure: LEFT HEART CATHETERIZATION WITH CORONARY ANGIOGRAM;  Surgeon: Larey Dresser, MD;  Location: [facility] CATH LAB;  Service: Cardiovascular;  Laterality: N/A;  . TUBALIGATION  1980    Current Facility-Administered Medications  Medication Dose Route Frequency Provider Last Rate Last Dose  . 0.9 %  sodium chloride infusion   Intravenous Continuous Milus Banister, MD        Allergies as of 01/21/2018 - Review Complete 01/21/2018  Allergen Reaction Noted  . Veltassa [patiromer] Diarrhea 12/22/2017  . Darvon Other (See Comments)  01/27/2011  . Promethazine hcl Other (See Comments) 01/27/2011  . Spironolactone Diarrhea and Nausea And Vomiting 11/05/2017  . Plavix [clopidogrel bisulfate] Rash 01/27/2011    Family History  Problem Relation Age of Onset  . Diabetes Mother   . Heart disease Mother   . Hyperlipidemia Mother   . Hypertension Mother   . Heart disease Father   . Heart attack Father   . Hypertension Father   . Early death Brother 30  . Breast cancer Sister   . Lung cancer Sister   . Irritable bowel syndrome Sister   . Colon cancer Maternal Aunt   . Esophageal cancer Neg Hx   . Colon polyps Neg Hx     Social History   Socioeconomic History  . Marital status: Divorced    Spouse name: Not on file  . Number of children: 3  . Years of education: 36  . Highest education level: Not on file  Occupational History  . Occupation: Retried  Scientific laboratory technician  . Financial resource strain: Not on file  . Food insecurity:    Worry: Not on file    Inability: Not on file  . Transportation needs:    Medical: Not on file    Non-medical: Not on file  Tobacco Use  . Smoking status: Former Smoker    Last attempt to quit: 08/16/1995    Years since quitting: 22.5  . Smokeless tobacco: Never Used  Substance and Sexual  Activity  . Alcohol use: No  . Drug use: No  . Sexual activity: Never    Birth control/protection: Post-menopausal  Lifestyle  . Physical activity:    Days per week: Not on file    Minutes per session: Not on file  . Stress: Not on file  Relationships  . Social connections:    Talks on phone: Not on file    Gets together: Not on file    Attends religious service: Not on file    Active member of club or organization: Not on file    Attends meetings of clubs or organizations: Not on file    Relationship status: Not on file  . Intimate partner violence:    Fear of current or ex partner: Not on file    Emotionally abused: Not on file    Physically abused: Not on file    Forced sexual  activity: Not on file  Other Topics Concern  . Not on file  Social History Narrative   Regular exercise-no   Caffeine Use-no     Physical Exam: BP (!) 136/56   Pulse 69   Temp 97.9 F (36.6 C) (Oral)   Resp 16   Ht 5\' 3"  (1.6 m)   Wt 86.2 kg   SpO2 98%   BMI 33.66 kg/m  Constitutional: generally well-appearing Psychiatric: alert and oriented x3 Abdomen: soft, nontender, nondistended, no obvious ascites, no peritoneal signs, normal bowel sounds No peripheral edema noted in lower extremities  Assessment and plan: 72 y.o. female with routine risk for colon cancer  For colonoscopy today  Please see the "Patient Instructions" section for addition details about the plan.  Owens Loffler, MD [facility] 02/24/2018, 7:04 AM

## 2018-02-25 ENCOUNTER — Ambulatory Visit: Payer: Medicare PPO | Admitting: Internal Medicine

## 2018-02-25 ENCOUNTER — Encounter (HOSPITAL_COMMUNITY): Payer: Self-pay | Admitting: Gastroenterology

## 2018-02-25 VITALS — BP 130/70 | HR 62 | Ht 63.0 in | Wt 191.0 lb

## 2018-02-25 DIAGNOSIS — Z23 Encounter for immunization: Secondary | ICD-10-CM

## 2018-02-25 DIAGNOSIS — E1165 Type 2 diabetes mellitus with hyperglycemia: Secondary | ICD-10-CM

## 2018-02-25 DIAGNOSIS — E1142 Type 2 diabetes mellitus with diabetic polyneuropathy: Secondary | ICD-10-CM

## 2018-02-25 DIAGNOSIS — IMO0002 Reserved for concepts with insufficient information to code with codable children: Secondary | ICD-10-CM

## 2018-02-25 DIAGNOSIS — E785 Hyperlipidemia, unspecified: Secondary | ICD-10-CM

## 2018-02-25 DIAGNOSIS — E1151 Type 2 diabetes mellitus with diabetic peripheral angiopathy without gangrene: Secondary | ICD-10-CM | POA: Diagnosis not present

## 2018-02-25 LAB — POCT GLYCOSYLATED HEMOGLOBIN (HGB A1C): HEMOGLOBIN A1C: 6.1 % — AB (ref 4.0–5.6)

## 2018-02-25 MED ORDER — INSULIN DETEMIR 100 UNIT/ML FLEXPEN: 35.0000 [IU] | PEN_INJECTOR | Freq: Every day | SUBCUTANEOUS | 3 refills | Status: DC

## 2018-02-25 MED ORDER — INSULIN ASPART 100 UNIT/ML ~~LOC~~ SOLN: 10.0000 [IU] | Freq: Three times a day (TID) | SUBCUTANEOUS | 99 refills | Status: DC

## 2018-02-25 MED ORDER — METFORMIN HCL ER 500 MG PO TB24: 1000.0000 mg | ORAL_TABLET | Freq: Every day | ORAL | 3 refills | Status: DC

## 2018-02-25 NOTE — Patient Instructions (Addendum)
Please decrease: - Metformin XR to 1000 mg with dinner - Levemir 35 units at night  Please use: - Novolog:  - 10 units before a small meal - 14 units before a large meal  Please return in 3-4 months with your sugar log.
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
## 2018-03-04 ENCOUNTER — Encounter (HOSPITAL_COMMUNITY): Payer: Medicare PPO | Admitting: Cardiology

## 2018-03-21 ENCOUNTER — Other Ambulatory Visit: Payer: Self-pay | Admitting: Internal Medicine

## 2018-03-22 LAB — CUP PACEART REMOTE DEVICE CHECK
Battery Remaining Longevity: 26 mo
Battery Voltage: 2.84 V
Brady Statistic AP VP Percent: 2.3 %
Brady Statistic AP VS Percent: 95 %
Brady Statistic AS VP Percent: 1 %
Brady Statistic AS VS Percent: 2.1 %
Brady Statistic RA Percent Paced: 96 %
Brady Statistic RV Percent Paced: 2.6 %
Date Time Interrogation Session: 20191028132851
HighPow Impedance: 41 Ohm
Implantable Lead Implant Date: 20070117
Implantable Lead Location: 753859
Implantable Lead Location: 753860
Implantable Lead Model: 7040
Lead Channel Impedance Value: 430 Ohm
Lead Channel Pacing Threshold Amplitude: 0.75 V
Lead Channel Pacing Threshold Amplitude: 1.25 V
Lead Channel Pacing Threshold Pulse Width: 0.5 ms
Lead Channel Sensing Intrinsic Amplitude: 11.8 mV
Lead Channel Sensing Intrinsic Amplitude: 4 mV
Lead Channel Setting Pacing Amplitude: 2 V
Lead Channel Setting Pacing Amplitude: 2.5 V
Lead Channel Setting Pacing Pulse Width: 0.5 ms
MDC IDC LEAD IMPLANT DT: 20070117
MDC IDC MSMT BATTERY REMAINING PERCENTAGE: 27 %
MDC IDC MSMT LEADCHNL RA PACING THRESHOLD PULSEWIDTH: 0.5 ms
MDC IDC MSMT LEADCHNL RV IMPEDANCE VALUE: 540 Ohm
MDC IDC PG IMPLANT DT: 20130212
MDC IDC SET LEADCHNL RV SENSING SENSITIVITY: 0.5 mV
Pulse Gen Serial Number: 819806

## 2018-04-13 ENCOUNTER — Other Ambulatory Visit: Payer: Self-pay | Admitting: Internal Medicine

## 2018-04-15 ENCOUNTER — Encounter: Payer: Self-pay | Admitting: Nurse Practitioner

## 2018-04-18 ENCOUNTER — Ambulatory Visit (INDEPENDENT_AMBULATORY_CARE_PROVIDER_SITE_OTHER): Payer: Medicare PPO

## 2018-04-18 DIAGNOSIS — I255 Ischemic cardiomyopathy: Secondary | ICD-10-CM

## 2018-04-18 DIAGNOSIS — I472 Ventricular tachycardia, unspecified: Secondary | ICD-10-CM

## 2018-04-19 NOTE — Progress Notes (Signed)
Remote ICD transmission.   

## 2018-04-21 ENCOUNTER — Other Ambulatory Visit: Payer: Self-pay | Admitting: Internal Medicine

## 2018-04-21 LAB — CUP PACEART REMOTE DEVICE CHECK
Battery Remaining Longevity: 26 mo
Battery Remaining Percentage: 27 %
Battery Voltage: 2.83 V
Brady Statistic AP VS Percent: 95 %
Brady Statistic AS VP Percent: 1 %
Brady Statistic AS VS Percent: 2.7 %
Brady Statistic RA Percent Paced: 95 %
Brady Statistic RV Percent Paced: 2.6 %
Date Time Interrogation Session: 20200127220307
HighPow Impedance: 48 Ohm
Implantable Lead Implant Date: 20070117
Implantable Lead Implant Date: 20070117
Implantable Lead Location: 753859
Implantable Lead Location: 753860
Implantable Lead Model: 7040
Implantable Pulse Generator Implant Date: 20130212
Lead Channel Impedance Value: 540 Ohm
Lead Channel Impedance Value: 580 Ohm
Lead Channel Pacing Threshold Amplitude: 0.75 V
Lead Channel Pacing Threshold Amplitude: 1.25 V
Lead Channel Pacing Threshold Pulse Width: 0.5 ms
Lead Channel Pacing Threshold Pulse Width: 0.5 ms
Lead Channel Sensing Intrinsic Amplitude: 11.8 mV
Lead Channel Sensing Intrinsic Amplitude: 3.1 mV
Lead Channel Setting Pacing Amplitude: 2 V
Lead Channel Setting Pacing Amplitude: 2.5 V
Lead Channel Setting Pacing Pulse Width: 0.5 ms
Lead Channel Setting Sensing Sensitivity: 0.5 mV
MDC IDC PG SERIAL: 819806
MDC IDC STAT BRADY AP VP PERCENT: 2.2 %

## 2018-04-23 ENCOUNTER — Other Ambulatory Visit (HOSPITAL_COMMUNITY): Payer: Self-pay | Admitting: Cardiology

## 2018-05-05 NOTE — Progress Notes (Deleted)
  Electrophysiology Office Note Date: 05/05/2018  ID:  [name], DOB 08/28/1945, MRN [id number]  PCP: John, James W, MD Primary Cardiologist: McLean Electrophysiologist: Allred  CC: Routine ICD follow-up  [name] is a 72 y.o. female seen today for Dr Allred.  She presents today for routine electrophysiology followup.  Since last being seen in our clinic, the patient reports doing very well. She denies chest pain, palpitations, dyspnea, PND, orthopnea, nausea, vomiting, dizziness, syncope, edema, weight gain, or early satiety.  She has not had ICD shocks.   Device History: ICD implanted *** for *** History of appropriate therapy: {yes/no:20286} History of AAD therapy: {yes/no:20286}   Past Medical History:  Diagnosis Date  . Allergy   . Arthritis   . Atrial fibrillation ([facility])    controlled with amiodarone, on coumadin  . Chronic renal insufficiency   . Chronic systolic heart failure ([facility])   . Congestive heart failure ([facility])   . Coronary artery disease 01/31/2011  . Diabetes mellitus    type 1  . 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   . History of chicken pox   . Hyperlipidemia   . Hypertension   . Ischemic cardiomyopathy   . Lumbar spondylosis 01/11/2012  . Sleep apnea   . Stroke ([facility]) 2010   eye doctor said she had TIA  . Ventricular tachycardia ([facility])    Polymorphic   Past Surgical History:  Procedure Laterality Date  . ABDOMINAL HYSTERECTOMY  1995  . CARDIAC DEFIBRILLATOR PLACEMENT    . CHOLECYSTECTOMY  1985  . COLONOSCOPY WITH PROPOFOL N/A 02/24/2018   Procedure: COLONOSCOPY WITH PROPOFOL;  Surgeon: Jacobs, Daniel P, MD;  Location: [facility] ENDOSCOPY;  Service: Endoscopy;  Laterality: N/A;  . ICD  2003/2007   implanted by Dr Weintraub, most recent generator change 2/13 by Dr Allred, Analyze ST study patient  . IMPLANTABLE CARDIOVERTER DEFIBRILLATOR (ICD) GENERATOR CHANGE N/A 05/05/2011   Procedure: ICD GENERATOR CHANGE;  Surgeon: James  Allred, MD;  Location: [facility] CATH LAB;  Service: Cardiovascular;  Laterality: N/A;  . LEFT HEART CATHETERIZATION WITH CORONARY ANGIOGRAM N/A 01/30/2011   Procedure: LEFT HEART CATHETERIZATION WITH CORONARY ANGIOGRAM;  Surgeon: Dalton S McLean, MD;  Location: [facility] CATH LAB;  Service: Cardiovascular;  Laterality: N/A;  . POLYPECTOMY  02/24/2018   Procedure: POLYPECTOMY;  Surgeon: Jacobs, Daniel P, MD;  Location: [facility] ENDOSCOPY;  Service: Endoscopy;;  . TUBALIGATION  1980    Current Outpatient Medications  Medication Sig Dispense Refill  . acetaminophen (TYLENOL) 500 MG tablet Take 500 mg by mouth daily as needed for moderate pain or headache.    . amiodarone (PACERONE) 100 MG tablet TAKE 1 TABLET BY MOUTH EVERY DAY (Patient taking differently: Take 100 mg by mouth daily. ) 30 tablet 5  . Blood Glucose Monitoring Suppl (ACCU-CHEK NANO SMARTVIEW) W/DEVICE KIT Use to test blood sugar 4 times daily as instructed. Dx code: E11.59 1 kit 0  . carvedilol (COREG) 25 MG tablet TAKE 1/2 TABLET TWICE DAILY WITH A MEAL (Patient taking differently: Take 12.5 mg by mouth 2 (two) times daily with a meal. ) 90 tablet 3  . Cholecalciferol (VITAMIN D3) 2000 UNITS TABS Take 2,000 Units by mouth daily.     . dicyclomine (BENTYL) 10 MG capsule TAKE 1 CAPSULE (10 MG TOTAL) BY MOUTH 4 (FOUR) TIMES DAILY BEFORE MEALS AND AT BEDTIME. 120 capsule 1  . ELIQUIS 5 MG TABS tablet TAKE 1 TABLET BY MOUTH TWICE A DAY 60 tablet 11  . ENTRESTO   24-26 MG TAKE 1 TABLET BY MOUTH TWICE A DAY 60 tablet 3  . furosemide (LASIX) 20 MG tablet TAKE 1 TABLET BY MOUTH EVERY DAY (Patient taking differently: Take 20 mg by mouth every other day. ) 30 tablet 3  . gabapentin (NEURONTIN) 400 MG capsule TAKE 1 CAPSULE (400 MG TOTAL) BY MOUTH 2 (TWO) TIMES DAILY. (Patient taking differently: Take by mouth 2 (two) times daily. ) 180 capsule 1  . glucose blood (ACCU-CHEK SMARTVIEW) test strip Use to test blood sugar 4 times daily as instructed. Dx code E11.59 200  each 11  . insulin aspart (NOVOLOG) 100 UNIT/ML injection Inject 10-14 Units into the skin 3 (three) times daily before meals. 3 vial PRN  . Insulin Detemir (LEVEMIR FLEXTOUCH) 100 UNIT/ML Pen Inject 35 Units into the skin at bedtime. 15 pen 3  . levothyroxine (SYNTHROID, LEVOTHROID) 25 MCG tablet TAKE 1 TABLET (25 MCG TOTAL) BY MOUTH DAILY BEFORE BREAKFAST. 90 tablet 1  . magnesium oxide (MAG-OX) 400 MG tablet TAKE 1 TABLET (400 MG TOTAL) BY MOUTH 2 (TWO) TIMES DAILY. 60 tablet 2  . metFORMIN (GLUCOPHAGE-XR) 500 MG 24 hr tablet Take 2 tablets (1,000 mg total) by mouth daily with supper. 180 tablet 3  . Multiple Vitamins-Minerals (EYE VITAMINS PO) Take 1 tablet by mouth 2 (two) times daily.     . nitroGLYCERIN (NITROSTAT) 0.4 MG SL tablet Place 1 tablet (0.4 mg total) under the tongue every 5 (five) minutes as needed for chest pain. 25 tablet 1  . nizatidine (AXID) 150 MG capsule Take 1 capsule by mouth daily. Must keep 04/29/16 appt for future refills 30 capsule 0  . omeprazole (PRILOSEC) 20 MG capsule TAKE 1 CAPSULE BY MOUTH EVERY DAY (Patient taking differently: Take 20 mg by mouth daily. ) 30 capsule 5  . polyethylene glycol-electrolytes (NULYTELY/GOLYTELY) 420 g solution Take 4,000 mLs by mouth as directed. 4000 mL 0  . rosuvastatin (CRESTOR) 20 MG tablet Take 1 tablet (20 mg total) by mouth daily. (Patient taking differently: Take 20 mg by mouth at bedtime. ) 90 tablet 3  . sacubitril-valsartan (ENTRESTO) 97-103 MG Take 1 tablet by mouth 2 (two) times daily. 60 tablet 11  . traMADol (ULTRAM) 50 MG tablet Take 1 tablet (50 mg total) by mouth every 6 (six) hours as needed (pain). 120 tablet 2   No current facility-administered medications for this visit.     Allergies:   Veltassa [patiromer]; Darvon; Promethazine hcl; Spironolactone; and Plavix [clopidogrel bisulfate]   Social History: Social History   Socioeconomic History  . Marital status: Divorced    Spouse name: Not on file  . Number  of children: 3  . Years of education: 62  . Highest education level: Not on file  Occupational History  . Occupation: Retried  Scientific laboratory technician  . Financial resource strain: Not on file  . Food insecurity:    Worry: Not on file    Inability: Not on file  . Transportation needs:    Medical: Not on file    Non-medical: Not on file  Tobacco Use  . Smoking status: Former Smoker    Last attempt to quit: 08/16/1995    Years since quitting: 22.7  . Smokeless tobacco: Never Used  Substance and Sexual Activity  . Alcohol use: No  . Drug use: No  . Sexual activity: Never    Birth control/protection: Post-menopausal  Lifestyle  . Physical activity:    Days per week: Not on file    Minutes per  session: Not on file  . Stress: Not on file  Relationships  . Social connections:    Talks on phone: Not on file    Gets together: Not on file    Attends religious service: Not on file    Active member of club or organization: Not on file    Attends meetings of clubs or organizations: Not on file    Relationship status: Not on file  . Intimate partner violence:    Fear of current or ex partner: Not on file    Emotionally abused: Not on file    Physically abused: Not on file    Forced sexual activity: Not on file  Other Topics Concern  . Not on file  Social History Narrative   Regular exercise-no   Caffeine Use-no    Family History: Family History  Problem Relation Age of Onset  . Diabetes Mother   . Heart disease Mother   . Hyperlipidemia Mother   . Hypertension Mother   . Heart disease Father   . Heart attack Father   . Hypertension Father   . Early death Brother 27  . Breast cancer Sister   . Lung cancer Sister   . Irritable bowel syndrome Sister   . Colon cancer Maternal Aunt   . Esophageal cancer Neg Hx   . Colon polyps Neg Hx     Review of Systems: All other systems reviewed and are otherwise negative except as noted above.   Physical Exam: VS:  There were no vitals  taken for this visit. , BMI There is no height or weight on file to calculate BMI.  GEN- The patient is well appearing, alert and oriented x 3 today.   HEENT: normocephalic, atraumatic; sclera clear, conjunctiva pink; hearing intact; oropharynx clear; neck supple, no JVP Lymph- no cervical lymphadenopathy Lungs- Clear to ausculation bilaterally, normal work of breathing.  No wheezes, rales, rhonchi Heart- Regular rate and rhythm, no murmurs, rubs or gallops, PMI not laterally displaced GI- soft, non-tender, non-distended, bowel sounds present, no hepatosplenomegaly Extremities- no clubbing, cyanosis, or edema; DP/PT/radial pulses 2+ bilaterally MS- no significant deformity or atrophy Skin- warm and dry, no rash or lesion; ICD pocket well healed Psych- euthymic mood, full affect Neuro- strength and sensation are intact  ICD interrogation- reviewed in detail today,  See PACEART report  EKG:  EKG is not ordered today.  Recent Labs: 08/02/2017: TSH 4.468 11/05/2017: ALT 11; BUN 29; Creatinine, Ser 1.59; Hemoglobin 11.8; Platelets 215; Potassium 5.0; Sodium 142   Wt Readings from Last 3 Encounters:  02/25/18 191 lb (86.6 kg)  02/24/18 190 lb (86.2 kg)  01/21/18 192 lb 8 oz (87.3 kg)     Other studies Reviewed: Additional studies/ records that were reviewed today include: Dr Allred and Dr McLean's office notes  Assessment and Plan:  1.  Chronic systolic dysfunction euvolemic today Stable on an appropriate medical regimen Normal ICD function See Pace Art report No changes today  2.  VT No recent recurrence Continue low dose amiodarone TSH ***  3.  Paroxysmal atrial fibrillation Burden by device interrogation ***% Continue eliquis for CHADS2VASC of 8   Current medicines are reviewed at length with the patient today.   The patient does not have concerns regarding her medicines.  The following changes were made today:  none  Labs/ tests ordered today include: none No orders  of the defined types were placed in this encounter.    Disposition:   Follow up with [facility],   Dr Aundra Dubin as scheduled, Dr Rayann Heman 1 year   Signed, Chanetta Marshall, NP 05/05/2018 7:30 PM  [address] [place] [address] [phone] (office) [phone] (fax)

## 2018-05-06 ENCOUNTER — Encounter: Payer: Medicare PPO | Admitting: Nurse Practitioner

## 2018-05-20 ENCOUNTER — Ambulatory Visit (HOSPITAL_COMMUNITY)
Admission: RE | Admit: 2018-05-20 | Discharge: 2018-05-20 | Disposition: A | Discharge status: Home / Self Care | Payer: Medicare PPO | Source: Ambulatory Visit | Attending: Cardiology | Admitting: Cardiology

## 2018-05-20 VITALS — BP 147/75 | HR 76 | Wt 199.6 lb

## 2018-05-20 DIAGNOSIS — I2510 Atherosclerotic heart disease of native coronary artery without angina pectoris: Secondary | ICD-10-CM | POA: Diagnosis not present

## 2018-05-20 DIAGNOSIS — E039 Hypothyroidism, unspecified: Secondary | ICD-10-CM | POA: Diagnosis not present

## 2018-05-20 DIAGNOSIS — Z794 Long term (current) use of insulin: Secondary | ICD-10-CM | POA: Diagnosis not present

## 2018-05-20 DIAGNOSIS — Z8673 Personal history of transient ischemic attack (TIA), and cerebral infarction without residual deficits: Secondary | ICD-10-CM | POA: Insufficient documentation

## 2018-05-20 DIAGNOSIS — Z87891 Personal history of nicotine dependence: Secondary | ICD-10-CM | POA: Diagnosis not present

## 2018-05-20 DIAGNOSIS — I5022 Chronic systolic (congestive) heart failure: Secondary | ICD-10-CM | POA: Insufficient documentation

## 2018-05-20 DIAGNOSIS — Z79899 Other long term (current) drug therapy: Secondary | ICD-10-CM | POA: Diagnosis not present

## 2018-05-20 DIAGNOSIS — I255 Ischemic cardiomyopathy: Secondary | ICD-10-CM | POA: Insufficient documentation

## 2018-05-20 DIAGNOSIS — Z955 Presence of coronary angioplasty implant and graft: Secondary | ICD-10-CM | POA: Insufficient documentation

## 2018-05-20 DIAGNOSIS — I472 Ventricular tachycardia: Secondary | ICD-10-CM | POA: Diagnosis not present

## 2018-05-20 DIAGNOSIS — I480 Paroxysmal atrial fibrillation: Secondary | ICD-10-CM

## 2018-05-20 DIAGNOSIS — E1122 Type 2 diabetes mellitus with diabetic chronic kidney disease: Secondary | ICD-10-CM | POA: Insufficient documentation

## 2018-05-20 DIAGNOSIS — I4891 Unspecified atrial fibrillation: Secondary | ICD-10-CM | POA: Insufficient documentation

## 2018-05-20 DIAGNOSIS — I130 Hypertensive heart and chronic kidney disease with heart failure and stage 1 through stage 4 chronic kidney disease, or unspecified chronic kidney disease: Secondary | ICD-10-CM | POA: Insufficient documentation

## 2018-05-20 DIAGNOSIS — Z79890 Hormone replacement therapy (postmenopausal): Secondary | ICD-10-CM | POA: Diagnosis not present

## 2018-05-20 DIAGNOSIS — Z95810 Presence of automatic (implantable) cardiac defibrillator: Secondary | ICD-10-CM | POA: Insufficient documentation

## 2018-05-20 DIAGNOSIS — K3184 Gastroparesis: Secondary | ICD-10-CM | POA: Diagnosis not present

## 2018-05-20 DIAGNOSIS — E1143 Type 2 diabetes mellitus with diabetic autonomic (poly)neuropathy: Secondary | ICD-10-CM | POA: Diagnosis not present

## 2018-05-20 DIAGNOSIS — N183 Chronic kidney disease, stage 3 (moderate): Secondary | ICD-10-CM | POA: Insufficient documentation

## 2018-05-20 DIAGNOSIS — Z7901 Long term (current) use of anticoagulants: Secondary | ICD-10-CM | POA: Diagnosis not present

## 2018-05-20 DIAGNOSIS — Z8249 Family history of ischemic heart disease and other diseases of the circulatory system: Secondary | ICD-10-CM | POA: Diagnosis not present

## 2018-05-20 LAB — CBC
HEMATOCRIT: 41.3 % (ref 36.0–46.0)
Hemoglobin: 12.4 g/dL (ref 12.0–15.0)
MCH: 29.4 pg (ref 26.0–34.0)
MCHC: 30 g/dL (ref 30.0–36.0)
MCV: 97.9 fL (ref 80.0–100.0)
Platelets: 192 10*3/uL (ref 150–400)
RBC: 4.22 MIL/uL (ref 3.87–5.11)
RDW: 13 % (ref 11.5–15.5)
WBC: 7.8 10*3/uL (ref 4.0–10.5)
nRBC: 0 % (ref 0.0–0.2)

## 2018-05-20 LAB — COMPREHENSIVE METABOLIC PANEL
ALT: 10 U/L (ref 0–44)
AST: 16 U/L (ref 15–41)
Albumin: 3.6 g/dL (ref 3.5–5.0)
Alkaline Phosphatase: 60 U/L (ref 38–126)
Anion gap: 10 (ref 5–15)
BUN: 22 mg/dL (ref 8–23)
CO2: 21 mmol/L — ABNORMAL LOW (ref 22–32)
Calcium: 9.1 mg/dL (ref 8.9–10.3)
Chloride: 108 mmol/L (ref 98–111)
Creatinine, Ser: 1.4 mg/dL — ABNORMAL HIGH (ref 0.44–1.00)
GFR calc Af Amer: 44 mL/min — ABNORMAL LOW (ref >60)
GFR calc non Af Amer: 38 mL/min — ABNORMAL LOW (ref >60)
GLUCOSE: 123 mg/dL — AB (ref 70–99)
Potassium: 4.4 mmol/L (ref 3.5–5.1)
Sodium: 139 mmol/L (ref 135–145)
Total Bilirubin: 0.5 mg/dL (ref 0.3–1.2)
Total Protein: 7 g/dL (ref 6.5–8.1)

## 2018-05-20 LAB — LIPID PANEL
Cholesterol: 145 mg/dL (ref 0–200)
HDL: 55 mg/dL (ref >40)
LDL Cholesterol: 72 mg/dL (ref 0–99)
Total CHOL/HDL Ratio: 2.6 RATIO
Triglycerides: 92 mg/dL (ref <150)
VLDL: 18 mg/dL (ref 0–40)

## 2018-05-20 MED ORDER — CARVEDILOL 12.5 MG PO TABS: 18.7500 mg | ORAL_TABLET | Freq: Two times a day (BID) | ORAL | 6 refills | Status: DC

## 2018-05-20 NOTE — Patient Instructions (Signed)
INCREASE Carvedilol to 18.75mg  (1.5 tabs) twice a day  Labs today We will only contact you if something comes back abnormal or we need to make some changes. Otherwise no news is good news!  Your physician has requested that you have an echocardiogram. Echocardiography is a painless test that uses sound waves to create images of your heart. It provides your doctor with information about the size and shape of your heart and how well your heart's chambers and valves are working. This procedure takes approximately one hour. There are no restrictions for this procedure.  Your physician recommends that you schedule a follow-up appointment in: 3 months with an ECHO

## 2018-05-21 ENCOUNTER — Other Ambulatory Visit: Payer: Self-pay | Admitting: Internal Medicine

## 2018-05-22 ENCOUNTER — Other Ambulatory Visit: Payer: Self-pay | Admitting: Internal Medicine

## 2018-05-22 NOTE — Progress Notes (Signed)
Patient ID: [name], female   DOB: 1945/08/16, 73 y.o.   MRN: [id number] PCP: Dr Jenny Reichmann Cardiology: Dr. Aundra Dubin  73 y.o. with history of CAD, ischemic cardiomyopathy, and atrial fibrillation presents for cardiology followup.  Patient was hospitalized in 11/12 at [facility] for VT with ICD discharge.  She had a left heart cath showing patent stents.  EF was 35-40% by echo.  She is on amiodarone.  She has had periodic problems with creatinine rising with medication adjustments.  Most recent echo in 12/18 showed EF 35-40%, inferior and septal hypokinesis, mild MR.    She returns today for followup of CHF. She is stable.  Main limitation is knee pain, this keeps her from exercising much and weight is up about 8 lbs.  She is short of breath with heavier exertion, no change from past.  No orthopnea/PND. No chest pain. No palpitations.    ECG: a-paced, LVH, inferolateral TWIs (personally reviewed).   Labs (10/12): LDL 73, HDL 44 Labs (11/12): K 3.9, creatinine 1.1, LFTs normal, TSH normal Labs (12/12): K 3.9, creatinine 1.5, proBNP 18 Labs (1/13): LDL 82, HDL 57 Labs (2/13): K 4.3, creatinine 1.5 Labs (5/13): creatinine 2.4 => 1.7, LFTs normal, TSH normal, proBNP 18 Labs (6/13): K 4.2, creatinine 1.7 Labs (10/13): K 4.3, creatinine 1.4 Labs (4/14); LFTs normal, TSH normal, LDL 71, HDL 58 Labs (5/14): K 4.5, creatinine 1.4 Labs (8/14): TSH normal, LFTs normal Labs (9/14): K 4.2, creatinine 1.8, LDL 75, HDL 54 Labs (07/31/13) K 3.7 Creatinine 1.8 BNP 148  Labs (9/15): K 4.3, creatinine 1.24, LFTs normal, LDL 81, HDL 58, TSH normal Labs (9/16): K 4.4, creatinine 1.44, LDL 64, HDL 54, LFTs normal, TSH normal, HCT 38 Labs (2/17): K 4.9, creatinine 1.76, HCT 38.6 Labs (1/19): LDL 71, HDL 58, K 4.8, creatinine 1.26, LFTs normal, hgb 12.9.  Labs (2/19): Creatinine 1.35, K 4.9 Labs (6/19): K 4.4, creatinine 1.57 Labs (8/19): K 5, creatinine 1.59, LFTs normal  PMH: 1. Diabetes mellitus 2. CVA,  TIA in 2010 3. HTN 4. CKD stage 3 5. H/o TAH 6. H/o CCY 7. Sciatica 8. Atrial fibrillation 9. CAD: s/p LAD and RCA PCI.  Last LHC in 11/12 with patent proximal LAD stent, ostial 70% D1 (jailed by stent), mild LAD stent patent, patent RCA stents, EF 40% with global hypokinesis.  10. Ischemic cardiomyopathy: Echo (10/12): EF 35-40%, moderate focal basal septal hypertrophy, inferior akinesis, grade I diastolic dysfunction, moderate aortic insufficiency. St Jude dual chamber ICD.  Spironolactone stopped when creatinine rose to 2.5.  Echo (5/14) with EF 40-45%, mild AI.  Echo (6/16) with EF 40-45%, inferior/inferoseptal hypokinesis, mild LVH.  - Echo (12/18): EF 35-40%, inferior and septal hypokinesis, mild MR, mild AI.  11. Aortic insufficiency: moderate in past but mild on most recent echo.   12. 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  13. Gastroparesis 14. Hypothyroidism  SH: Divorced.  3 children.  Quit smoking in 1997. Lives in [place] now with daughter.  [place].   FH: CAD  ROS: All systems reviewed and negative except as per HPI.   Current Outpatient Medications  Medication Sig Dispense Refill  . acetaminophen (TYLENOL) 500 MG tablet Take 500 mg by mouth daily as needed for moderate pain or headache.    [name] amiodarone (PACERONE) 100 MG tablet TAKE 1 TABLET BY MOUTH EVERY DAY (Patient taking differently: Take 100 mg by mouth daily. ) 30 tablet 5  . Blood Glucose Monitoring Suppl (ACCU-CHEK NANO SMARTVIEW) W/DEVICE KIT  Use to test blood sugar 4 times daily as instructed. Dx code: E11.59 1 kit 0  . carvedilol (COREG) 12.5 MG tablet Take 1.5 tablets (18.75 mg total) by mouth 2 (two) times daily with a meal. Please cancel all previous orders for current medication. Change in dosage or pill size. 90 tablet 6  . Cholecalciferol (VITAMIN D3) 2000 UNITS TABS Take 2,000 Units by mouth daily.     [name] dicyclomine (BENTYL) 10 MG capsule TAKE 1 CAPSULE (10 MG TOTAL) BY MOUTH 4 (FOUR) TIMES DAILY BEFORE  MEALS AND AT BEDTIME. 120 capsule 1  . ELIQUIS 5 MG TABS tablet TAKE 1 TABLET BY MOUTH TWICE A DAY 60 tablet 11  . furosemide (LASIX) 20 MG tablet TAKE 1 TABLET BY MOUTH EVERY DAY (Patient taking differently: Take 20 mg by mouth every other day. ) 30 tablet 3  . gabapentin (NEURONTIN) 400 MG capsule TAKE 1 CAPSULE (400 MG TOTAL) BY MOUTH 2 (TWO) TIMES DAILY. (Patient taking differently: Take by mouth 2 (two) times daily. ) 180 capsule 1  . glucose blood (ACCU-CHEK SMARTVIEW) test strip Use to test blood sugar 4 times daily as instructed. Dx code E11.59 200 each 11  . insulin aspart (NOVOLOG) 100 UNIT/ML injection Inject 10-14 Units into the skin 3 (three) times daily before meals. 3 vial PRN  . Insulin Detemir (LEVEMIR FLEXTOUCH) 100 UNIT/ML Pen Inject 35 Units into the skin at bedtime. 15 pen 3  . levothyroxine (SYNTHROID, LEVOTHROID) 25 MCG tablet TAKE 1 TABLET (25 MCG TOTAL) BY MOUTH DAILY BEFORE BREAKFAST. 90 tablet 1  . magnesium oxide (MAG-OX) 400 MG tablet TAKE 1 TABLET (400 MG TOTAL) BY MOUTH 2 (TWO) TIMES DAILY. 60 tablet 2  . metFORMIN (GLUCOPHAGE-XR) 500 MG 24 hr tablet Take 2 tablets (1,000 mg total) by mouth daily with supper. 180 tablet 3  . Multiple Vitamins-Minerals (EYE VITAMINS PO) Take 1 tablet by mouth 2 (two) times daily.     . nitroGLYCERIN (NITROSTAT) 0.4 MG SL tablet Place 1 tablet (0.4 mg total) under the tongue every 5 (five) minutes as needed for chest pain. 25 tablet 1  . nizatidine (AXID) 150 MG capsule Take 1 capsule by mouth daily. Must keep 04/29/16 appt for future refills 30 capsule 0  . omeprazole (PRILOSEC) 20 MG capsule TAKE 1 CAPSULE BY MOUTH EVERY DAY (Patient taking differently: Take 20 mg by mouth daily. ) 30 capsule 5  . polyethylene glycol-electrolytes (NULYTELY/GOLYTELY) 420 g solution Take 4,000 mLs by mouth as directed. 4000 mL 0  . rosuvastatin (CRESTOR) 20 MG tablet Take 1 tablet (20 mg total) by mouth daily. (Patient taking differently: Take 20 mg by mouth  at bedtime. ) 90 tablet 3  . sacubitril-valsartan (ENTRESTO) 97-103 MG Take 1 tablet by mouth 2 (two) times daily. 60 tablet 11  . sacubitril-valsartan (ENTRESTO) 97-103 MG Take 1 tablet by mouth 2 (two) times daily.    . traMADol (ULTRAM) 50 MG tablet Take 1 tablet (50 mg total) by mouth every 6 (six) hours as needed (pain). 120 tablet 2   No current facility-administered medications for this encounter.    Filed Weights   05/20/18 1144  Weight: 90.5 kg (199 lb 9.6 oz)    BP (!) 147/75   Pulse 76   Wt 90.5 kg (199 lb 9.6 oz)   SpO2 95%   BMI 35.36 kg/m  General: NAD Neck: No JVD, no thyromegaly or thyroid nodule.  Lungs: Clear to auscultation bilaterally with normal respiratory effort. CV: Nondisplaced PMI.  Heart regular S1/S2, no S3/S4, 2/6 SEM RUSB.  No peripheral edema.  No carotid bruit.  Normal pedal pulses.  Abdomen: Soft, nontender, no hepatosplenomegaly, no distention.  Skin: Intact without lesions or rashes.  Neurologic: Alert and oriented x 3.  Psych: Normal affect. Extremities: No clubbing or cyanosis.  HEENT: Normal.   Assessment/Plan 1. Atrial fibrillation: A-paced today.  - Continue amiodarone 100 mg daily.  Check LFTs today. She is on Levoxyl followed by PCP. She will need regular eye exams.  - Continue Eliquis 5 mg bid. CBC today.   2. Coronary artery disease: No chest pain.  Nonobstructive disease on last cath.  - Continue Crestor 20 mg daily.  Check lipids today.   3. Ventricular tachycardia - On amiodarone 100 mg daily with suppression of VT. 4. Chronic systolic CHF: Ischemic cardiomyopath: NYHA class II symptoms, she is not volume overloaded on exam.  EF 35-40% in 12/18. She has a [employer] ICD.  - Continue Entresto 97/103 mg BID  - Increase Coreg to 18.75 mg bid.  - Continue Lasix 20 mg qod.  BMET today. .  - Did not tolerate spironolactone in the past due to side effects.  She is not on eplerenone, would aim to start this in the future.  With history of  high normal to mildly elevated K, may need Lokelma if eplerenone is used.  - Repeat echo at followup in 3 months.  5. CKD stage 3: BMET today.   6. Hypothyroidism: Managed by PCP.   Followup in 3 months with echo.  Loralie Champagne 05/22/2018

## 2018-05-23 ENCOUNTER — Encounter (HOSPITAL_COMMUNITY): Payer: Self-pay | Admitting: *Deleted

## 2018-05-25 ENCOUNTER — Other Ambulatory Visit: Payer: Self-pay | Admitting: Internal Medicine

## 2018-05-30 ENCOUNTER — Ambulatory Visit (INDEPENDENT_AMBULATORY_CARE_PROVIDER_SITE_OTHER): Payer: Medicare PPO | Admitting: Internal Medicine

## 2018-05-30 ENCOUNTER — Encounter: Payer: Self-pay | Admitting: Internal Medicine

## 2018-05-30 VITALS — BP 150/74 | HR 65 | Ht 63.0 in | Wt 199.6 lb

## 2018-05-30 DIAGNOSIS — I5022 Chronic systolic (congestive) heart failure: Secondary | ICD-10-CM | POA: Diagnosis not present

## 2018-05-30 DIAGNOSIS — I472 Ventricular tachycardia, unspecified: Secondary | ICD-10-CM

## 2018-05-30 DIAGNOSIS — I255 Ischemic cardiomyopathy: Secondary | ICD-10-CM | POA: Diagnosis not present

## 2018-05-30 DIAGNOSIS — Z95810 Presence of automatic (implantable) cardiac defibrillator: Secondary | ICD-10-CM

## 2018-05-30 DIAGNOSIS — I480 Paroxysmal atrial fibrillation: Secondary | ICD-10-CM

## 2018-05-30 NOTE — Progress Notes (Signed)
PCP: Biagio Borg, MD Primary Cardiologist: Dr Aundra Dubin Primary EP: Dr Rayann Heman  Earley [name] is a 73 y.o. female who presents today for routine electrophysiology followup.  Since last being seen in our clinic, the patient reports doing reasonably well.  She is very concerned about her son who is critically ill at this time.  Today, she denies symptoms of palpitations, chest pain, shortness of breath,  lower extremity edema, dizziness, presyncope, syncope, or ICD shocks.  The patient is otherwise without complaint today.   Past Medical History:  Diagnosis Date  . Allergy   . Arthritis   . Atrial fibrillation ([place])    controlled with amiodarone, on coumadin  . Chronic renal insufficiency   . Chronic systolic heart failure ([place])   . Congestive heart failure ([place])   . Coronary artery disease 01/31/2011  . Diabetes mellitus    type 1  . 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   . History of chicken pox   . Hyperlipidemia   . Hypertension   . Ischemic cardiomyopathy   . Lumbar spondylosis 01/11/2012  . Sleep apnea   . Stroke [facility]) 2010   eye doctor said she had TIA  . Ventricular tachycardia ([place])    Polymorphic   Past Surgical History:  Procedure Laterality Date  . ABDOMINAL HYSTERECTOMY  1995  . CARDIAC DEFIBRILLATOR PLACEMENT    . CHOLECYSTECTOMY  1985  . COLONOSCOPY WITH PROPOFOL N/A 02/24/2018   Procedure: COLONOSCOPY WITH PROPOFOL;  Surgeon: Milus Banister, MD;  Location: [facility] ENDOSCOPY;  Service: Endoscopy;  Laterality: N/A;  . ICD  2003/2007   implanted by Dr Rollene Fare, most recent generator change 2/13 by Dr Rayann Heman, Analyze ST study patient  . IMPLANTABLE CARDIOVERTER DEFIBRILLATOR (ICD) GENERATOR CHANGE N/A 05/05/2011   Procedure: ICD GENERATOR CHANGE;  Surgeon: Thompson Grayer, MD;  Location: [facility] CATH LAB;  Service: Cardiovascular;  Laterality: N/A;  . LEFT HEART CATHETERIZATION WITH CORONARY ANGIOGRAM N/A 01/30/2011   Procedure: LEFT HEART CATHETERIZATION WITH  CORONARY ANGIOGRAM;  Surgeon: Larey Dresser, MD;  Location[facility] C[facility] LAB;  Service: Cardiovascular;  Laterality: N/A;  . POLYPECTOMY  02/24/2018   Procedure: POLYPECTOMY;  Surgeon: Milus Banister, MD;  Location: [facility] ENDOSCOPY;  Service: Endoscopy;;  . [place] are reviewed and negative except as per HPI above  Current Outpatient Medications  Medication Sig Dispense Refill  . acetaminophen (TYLENOL) 500 MG tablet Take 500 mg by mouth daily as needed for moderate pain or headache.    [name] amiodarone (PACERONE) 100 MG tablet TAKE 1 TABLET BY MOUTH EVERY DAY 90 tablet 1  . Blood Glucose Monitoring Suppl (ACCU-CHEK NANO SMARTVIEW) W/DEVICE KIT Use to test blood sugar 4 times daily as instructed. Dx code: E11.59 1 kit 0  . carvedilol (COREG) 12.5 MG tablet Take 1.5 tablets (18.75 mg total) by mouth 2 (two) times daily with a meal. Please cancel all previous orders for current medication. Change in dosage or pill size. 90 tablet 6  . Cholecalciferol (VITAMIN D3) 2000 UNITS TABS Take 2,000 Units by mouth daily.     [name] dicyclomine (BENTYL) 10 MG capsule TAKE 1 CAPSULE (10 MG TOTAL) BY MOUTH 4 (FOUR) TIMES DAILY BEFORE MEALS AND AT BEDTIME. 120 capsule 1  . ELIQUIS 5 MG TABS tablet TAKE 1 TABLET BY MOUTH TWICE A DAY 60 tablet 11  . furosemide (LASIX) 20 MG tablet TAKE 1 TABLET BY MOUTH EVERY DAY (Patient taking differently: Take 20  mg by mouth every other day. ) 30 tablet 3  . gabapentin (NEURONTIN) 400 MG capsule TAKE 1 CAPSULE (400 MG TOTAL) BY MOUTH 2 (TWO) TIMES DAILY. 180 capsule 1  . glucose blood (ACCU-CHEK SMARTVIEW) test strip Use to test blood sugar 4 times daily as instructed. Dx code E11.59 200 each 11  . insulin aspart (NOVOLOG) 100 UNIT/ML injection Inject 10-14 Units into the skin 3 (three) times daily before meals. 3 vial PRN  . Insulin Detemir (LEVEMIR FLEXTOUCH) 100 UNIT/ML Pen Inject 35 Units into the skin at bedtime. 15 pen 3  . levothyroxine (SYNTHROID,  LEVOTHROID) 25 MCG tablet TAKE 1 TABLET (25 MCG TOTAL) BY MOUTH DAILY BEFORE BREAKFAST. 90 tablet 1  . magnesium oxide (MAG-OX) 400 MG tablet TAKE 1 TABLET (400 MG TOTAL) BY MOUTH 2 (TWO) TIMES DAILY. 60 tablet 2  . metFORMIN (GLUCOPHAGE-XR) 500 MG 24 hr tablet Take 2 tablets (1,000 mg total) by mouth daily with supper. 180 tablet 3  . Multiple Vitamins-Minerals (EYE VITAMINS PO) Take 1 tablet by mouth 2 (two) times daily.     . nitroGLYCERIN (NITROSTAT) 0.4 MG SL tablet Place 1 tablet (0.4 mg total) under the tongue every 5 (five) minutes as needed for chest pain. 25 tablet 1  . nizatidine (AXID) 150 MG capsule Take 1 capsule by mouth daily. Must keep 04/29/16 appt for future refills 30 capsule 0  . omeprazole (PRILOSEC) 20 MG capsule TAKE 1 CAPSULE BY MOUTH EVERY DAY (Patient taking differently: Take 20 mg by mouth daily. ) 30 capsule 5  . polyethylene glycol-electrolytes (NULYTELY/GOLYTELY) 420 g solution Take 4,000 mLs by mouth as directed. 4000 mL 0  . rosuvastatin (CRESTOR) 20 MG tablet Take 1 tablet (20 mg total) by mouth daily. (Patient taking differently: Take 20 mg by mouth at bedtime. ) 90 tablet 3  . sacubitril-valsartan (ENTRESTO) 97-103 MG Take 1 tablet by mouth 2 (two) times daily. 60 tablet 11  . sacubitril-valsartan (ENTRESTO) 97-103 MG Take 1 tablet by mouth 2 (two) times daily.    . traMADol (ULTRAM) 50 MG tablet Take 1 tablet (50 mg total) by mouth every 6 (six) hours as needed (pain). 120 tablet 2   No current facility-administered medications for this visit.     Physical Exam: Vitals:   05/30/18 1446  BP: (!) 150/74  Pulse: 65  SpO2: 96%  Weight: 199 lb 9.6 oz (90.5 kg)  Height: _0  (1.6 m)    GEN- The patient is well appearing, alert and oriented x 3 today.   Head- normocephalic, atraumatic Eyes-  Sclera clear, conjunctiva pink Ears- hearing intact Oropharynx- clear Lungs- Clear to ausculation bilaterally, normal work of breathing Chest- ICD pocket is well  healed Heart- Regular rate and rhythm, no murmurs, rubs or gallops, PMI not laterally displaced GI- soft, NT, ND, + BS Extremities- no clubbing, cyanosis, or edema  ICD interrogation- reviewed in detail today,  See PACEART report  ekg tracing ordered today is personally reviewed and shows atrial paced rhythm 65 bpm, PR 228 msec, LVH, stable TWI  Wt Readings from Last 3 Encounters:  05/30/18 199 lb 9.6 oz (90.5 kg)  05/20/18 199 lb 9.6 oz (90.5 kg)  02/25/18 191 lb (86.6 kg)    Assessment and Plan:  1.  Chronic systolic dysfunction euvolemic today Stable on an appropriate medical regimen Normal ICD function See [facility] Art report No changes today she is not device dependant today  2. VT Well controlled with amiodarone 100 mg daily Labs 2/20  reviewed today  3. afib Well controlled with low dose amiodarone chads2vasc score is 8.  On eliquis  [place] Return to see EP NP in a year  Thompson Grayer MD, [facility] 05/30/2018 2:57 PM

## 2018-05-30 NOTE — Patient Instructions (Signed)
Medication Instructions:  Your physician recommends that you continue on your current medications as directed. Please refer to the Current Medication list given to you today.  Labwork: None ordered.  Testing/Procedures: None ordered.  Follow-Up: Your physician recommends that you schedule a follow-up appointment in:   One Year with Chanetta Marshall, NP  3 month remote checks  Any Other Special Instructions Will Be Listed Below (If Applicable).     If you need a refill on your cardiac medications before your next appointment, please call your pharmacy.

## 2018-05-31 LAB — CUP PACEART INCLINIC DEVICE CHECK
Battery Remaining Longevity: 24 mo
Brady Statistic RA Percent Paced: 95 %
Brady Statistic RV Percent Paced: 2.5 %
Date Time Interrogation Session: 20200309184551
HIGH POWER IMPEDANCE MEASURED VALUE: 44.788
Implantable Lead Implant Date: 20070117
Implantable Lead Location: 753860
Implantable Lead Model: 7040
Implantable Pulse Generator Implant Date: 20130212
Lead Channel Impedance Value: 437.5 Ohm
Lead Channel Impedance Value: 537.5 Ohm
Lead Channel Pacing Threshold Amplitude: 0.5 V
Lead Channel Pacing Threshold Amplitude: 0.5 V
Lead Channel Pacing Threshold Amplitude: 1 V
Lead Channel Pacing Threshold Amplitude: 1 V
Lead Channel Pacing Threshold Pulse Width: 0.5 ms
Lead Channel Pacing Threshold Pulse Width: 0.5 ms
Lead Channel Pacing Threshold Pulse Width: 0.5 ms
Lead Channel Pacing Threshold Pulse Width: 0.5 ms
Lead Channel Sensing Intrinsic Amplitude: 11.8 mV
Lead Channel Sensing Intrinsic Amplitude: 3.5 mV
Lead Channel Setting Pacing Amplitude: 2 V
Lead Channel Setting Pacing Pulse Width: 0.5 ms
Lead Channel Setting Sensing Sensitivity: 0.5 mV
MDC IDC LEAD IMPLANT DT: 20070117
MDC IDC LEAD LOCATION: 753859
MDC IDC PG SERIAL: 819806
MDC IDC SET LEADCHNL RV PACING AMPLITUDE: 2.5 V

## 2018-06-09 ENCOUNTER — Ambulatory Visit: Payer: Self-pay

## 2018-06-09 ENCOUNTER — Telehealth: Payer: Self-pay | Admitting: Internal Medicine

## 2018-06-09 MED ORDER — ALPRAZOLAM 0.5 MG PO TABS: ORAL_TABLET | ORAL | 0 refills | Status: DC

## 2018-06-09 NOTE — Telephone Encounter (Signed)
Routed to wrong practice. Sending to [facility].

## 2018-06-09 NOTE — Telephone Encounter (Signed)
Please reference nurse triage from today 06/09/18. Epic will not allow me to route triage note sent to [place] by mistake.   Patients son passed away. Remer Macho is tomorrow.   She is requesting medication for anxiety.

## 2018-06-09 NOTE — Addendum Note (Signed)
Addended by: Biagio Borg on: 06/09/2018 04:40 PM   Modules accepted: Orders

## 2018-06-09 NOTE — Telephone Encounter (Signed)
I have routed triage call to PCP

## 2018-06-09 NOTE — Telephone Encounter (Signed)
Called pt, LVM.   

## 2018-06-09 NOTE — Telephone Encounter (Signed)
Done erx 

## 2018-06-09 NOTE — Telephone Encounter (Signed)
Pt called requesting something for anxiety. Pt's son died last week and the funeral is coming up soon. Pt has a good support system and denies any physical complaints. Pt requests the prescription to be called in to [facility] on file. Routing note high priority to office. Called and spoke with Tammy aat office.  Reason for Disposition . Recent traumatic event (e.g., death of a loved one, job loss, victim/witness of crime)  Answer Assessment - Initial Assessment Questions 1. CONCERN: "What happened that made you call today?"     Son died 2000/06/15. ANXIETY SYMPTOM SCREENING: "Can you describe how you have been feeling?"  (e.g., tense, restless, panicky, anxious, keyed up, trouble sleeping, trouble concentrating)     Grieving anxious 3. ONSET: "How long have you been feeling this way?"     Since the 2022-06-16 4. RECURRENT: "Have you felt this way before?"  If yes: "What happened that time?" "What helped these feelings go away in the past?"       5. RISK OF HARM - SUICIDAL IDEATION:  "Do you ever have thoughts of hurting or killing yourself?"  (e.g., yes, no, no but preoccupation with thoughts about death)   - INTENT:  "Do you have thoughts of hurting or killing yourself right NOW?" (e.g., yes, no, N/A)   - PLAN: "Do you have a specific plan for how you would do this?" (e.g., gun, knife, overdose, no plan, N/A)     no 6. RISK OF HARM - HOMICIDAL IDEATION:  "Do you ever have thoughts of hurting or killing someone else?"  (e.g., yes, no, no but preoccupation with thoughts about death)   - INTENT:  "Do you have thoughts of hurting or killing someone right NOW?" (e.g., yes, no, N/A)   - PLAN: "Do you have a specific plan for how you would do this?" (e.g., gun, knife, no plan, N/A)      no 7. FUNCTIONAL IMPAIRMENT: "How have things been going for you overall in your life? Have you had any more difficulties than usual doing your normal daily activities?"  (e.g., better, same, worse; self-care, school, work,  interactions)     yes 8. SUPPORT: "Who is with you now?" "Who do you live with?" "Do you have family or friends nearby who you can talk to?"     Son-son-yes 9. THERAPIST: "Do you have a counselor or therapist? Name?"     10. STRESSORS: "Has there been any new stress or recent changes in your life?"      Son died 54. CAFFEINE ABUSE: "Do you drink caffeinated beverages, and how much each day?" (e.g., coffee, tea, colas)        12. SUBSTANCE ABUSE: "Do you use any illegal drugs or alcohol?"       13. OTHER SYMPTOMS: "Do you have any other physical symptoms right now?" (e.g., chest pain, palpitations, difficulty breathing, fever)      no 14. PREGNANCY: "Is there any chance you are pregnant?" "When was your last menstrual period?"      no  Protocols used: ANXIETY AND PANIC ATTACK-A-AH

## 2018-06-21 ENCOUNTER — Other Ambulatory Visit: Payer: Self-pay | Admitting: Internal Medicine

## 2018-06-24 ENCOUNTER — Encounter: Payer: Self-pay | Admitting: Internal Medicine

## 2018-06-24 ENCOUNTER — Other Ambulatory Visit (HOSPITAL_COMMUNITY): Payer: Self-pay | Admitting: Cardiology

## 2018-06-24 ENCOUNTER — Ambulatory Visit (INDEPENDENT_AMBULATORY_CARE_PROVIDER_SITE_OTHER): Payer: Medicare PPO | Admitting: Internal Medicine

## 2018-06-24 DIAGNOSIS — E1165 Type 2 diabetes mellitus with hyperglycemia: Secondary | ICD-10-CM | POA: Diagnosis not present

## 2018-06-24 DIAGNOSIS — E785 Hyperlipidemia, unspecified: Secondary | ICD-10-CM | POA: Diagnosis not present

## 2018-06-24 DIAGNOSIS — E1142 Type 2 diabetes mellitus with diabetic polyneuropathy: Secondary | ICD-10-CM | POA: Diagnosis not present

## 2018-06-24 DIAGNOSIS — E1151 Type 2 diabetes mellitus with diabetic peripheral angiopathy without gangrene: Secondary | ICD-10-CM

## 2018-06-24 DIAGNOSIS — IMO0002 Reserved for concepts with insufficient information to code with codable children: Secondary | ICD-10-CM

## 2018-06-24 NOTE — Progress Notes (Signed)
Patient ID: [name], female   DOB: 1946/02/12, 73 y.o.   MRN: [id number]  Patient location: Home My location: Office  Referring Provider: Biagio Borg, MD  I connected with the patient on 06/24/18 at 10:48 AM EDT by phone and verified that I am speaking with the correct person.   I discussed the limitations of evaluation and management by phone and the availability of in person appointments. The patient expressed understanding and agreed to proceed.   Details of the encounter are shown below.  HPI: [name] is a 73 y.o.-year-old female, presenting for f/u for DM2, insulin-dependent, uncontrolled, with complications (CAD, ICM - had ICD, peripheral neuropathy, gastroparesis). Last visit 4 months ago.  Her 84 y/o son passed away last month (had cirrhosis >> developed cough, fever, generalized pain - Covid 19 negative, + bilateral PNA).  She is grieving.  Sugars are higher.  Last hemoglobin A1c was: Lab Results  Component Value Date   HGBA1C 6.1 (A) 02/25/2018   HGBA1C 7.2 07/02/2017   HGBA1C 8.1 (H) 04/08/2017  07/03/2013: 7.2% - through her insurance  She is now on: - Metformin XR 2000 >> 1000 mg with dinner - Levemir 40 >> 35 units at night - Novolog:  - 10 units before a small meal - 14 units before a large meal  Pt checks her sugars 3-4 times a day: - am:  180-200 >> 62-143, 180 >> 61, 102-184 - after b'fast: 175 >> n/c - before lunch: 180 >> 62-131, 143 >> 108-191 - after lunch:  45, 47, 140-150 >> n/c - before dinner:  90-160, 170 >> 116-264 >> 110-180 - bedtime:  47, 170s  >> 71, 110-275 >> n/c Lowest sugar was 45 >> 56 (seldom - evening, night) >> 50s (no particular time of the day);  she has hypoglycemia awareness in the 80s. Highest sugar was HI (steroid inj) >> 275 >> 300 x1.  -+ CKD, last BUN/creatinine:   Lab Results  Component Value Date   BUN 22 05/20/2018   CREATININE 1.40 (H) 05/20/2018   Low GFR: Lab Results  Component Value Date    GFRNONAA 38 (L) 05/20/2018   GFRNONAA 32 (L) 11/05/2017   GFRNONAA 32 (L) 08/27/2017   GFRNONAA 25 (L) 08/20/2017   GFRNONAA 33 (L) 08/02/2017   ACR normal: Lab Results  Component Value Date   MICRALBCREAT 9.4 04/08/2017   MICRALBCREAT 3.2 06/25/2016   MICRALBCREAT 1.6 12/21/2014   MICRALBCREAT 1.0 12/15/2012  On Entresto.  -+ HL; last set of lipids: Lab Results  Component Value Date   CHOL 145 05/20/2018   HDL 55 05/20/2018   LDLCALC 72 05/20/2018   TRIG 92 05/20/2018   CHOLHDL 2.6 05/20/2018  On Crestor.  - last eye exam was in 2018: No DR  -+ numbness and tingling in her feet-on Neurontin  She is on Eliquis.  Latest TSH reviewed: Lab Results  Component Value Date   TSH 4.468 08/02/2017   She was started on levothyroxine 25 mcg daily.  ROS: Constitutional: no weight gain/no weight loss, no fatigue, no subjective hyperthermia, no subjective hypothermia Eyes: no blurry vision, no xerophthalmia ENT: no sore throat, no nodules palpated in neck, no dysphagia, no odynophagia, no hoarseness Cardiovascular: no CP/no SOB/no palpitations/no leg swelling Respiratory: no cough/no SOB/no wheezing Gastrointestinal: no N/no V/no D/no C/no acid reflux Musculoskeletal: no muscle aches/no joint aches Skin: no rashes, no hair loss Neurological: no tremors/+ numbness/+ tingling/no dizziness  I reviewed pt's medications, allergies, PMH, social  hx, family hx, and changes were documented in the history of present illness. Otherwise, unchanged from my initial visit note.  Past Medical History:  Diagnosis Date  . Allergy   . Arthritis   . Atrial fibrillation ([place])    controlled with amiodarone, on coumadin  . Chronic renal insufficiency   . Chronic systolic heart failure ([place])   . Congestive heart failure ([place])   . Coronary artery disease 01/31/2011  . Diabetes mellitus    type 1  . 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   . History of chicken pox   . Hyperlipidemia    . Hypertension   . Ischemic cardiomyopathy   . Lumbar spondylosis 01/11/2012  . Sleep apnea   . Stroke [facility]) 2010   eye doctor said she had TIA  . Ventricular tachycardia ([place])    Polymorphic   Past Surgical History:  Procedure Laterality Date  . ABDOMINAL HYSTERECTOMY  1995  . CARDIAC DEFIBRILLATOR PLACEMENT    . CHOLECYSTECTOMY  1985  . COLONOSCOPY WITH PROPOFOL N/A 02/24/2018   Procedure: COLONOSCOPY WITH PROPOFOL;  Surgeon: Milus Banister, MD;  Location: [facility];  Service: Endoscopy;  Laterality: N/A;  . ICD  2003/2007   implanted by Dr Rollene Fare, most recent generator change 2/13 by Dr Rayann Heman, Analyze ST study patient  . IMPLANTABLE CARDIOVERTER DEFIBRILLATOR (ICD) GENERATOR CHANGE N/A 05/05/2011   Procedure: ICD GENERATOR CHANGE;  Surgeon: Thompson Grayer, MD;  [facility] LAB;  Service: Cardiovascular;  Laterality: N/A;  . LEFT HEART CATHETERIZATION WITH CORONARY ANGIOGRAM N/A 01/30/2011   Procedure: LEFT HEART CATHETERIZATION WITH CORONARY ANGIOGRAM;  Surgeon: Larey Dresser, MD;  Location[facility] C[facility] LAB;  Service: Cardiovascular;  Laterality: N/A;  . POLYPECTOMY  02/24/2018   Procedure: POLYPECTOMY;  Surgeon: Milus Banister, MD;  Location: [facility] ENDOSCOPY;  Service: Endoscopy;;  . [place] History   Socioeconomic History  . Marital status: Divorced    Spouse name: Not on file  . Number of children: 3  . Years of education: 50  . Highest education level: Not on file  Occupational History  . Occupation: Retried  Scientific laboratory technician  . Financial resource strain: Not on file  . Food insecurity:    Worry: Not on file    Inability: Not on file  . Transportation needs:    Medical: Not on file    Non-medical: Not on file  Tobacco Use  . Smoking status: Former Smoker    Last attempt to quit: 08/16/1995    Years since quitting: 22.8  . Smokeless tobacco: Never Used  Substance and Sexual Activity  . Alcohol use: No  . Drug use: No  . Sexual  activity: Never    Birth control/protection: Post-menopausal  Lifestyle  . Physical activity:    Days per week: Not on file    Minutes per session: Not on file  . Stress: Not on file  Relationships  . Social connections:    Talks on phone: Not on file    Gets together: Not on file    Attends religious service: Not on file    Active member of club or organization: Not on file    Attends meetings of clubs or organizations: Not on file    Relationship status: Not on file  . Intimate partner violence:    Fear of current or ex partner: Not on file    Emotionally abused: Not on file    Physically abused: Not on file  Forced sexual activity: Not on file  Other Topics Concern  . Not on file  Social History Narrative   Regular exercise-no   Caffeine Use-no   Current Outpatient Medications on File Prior to Visit  Medication Sig Dispense Refill  . acetaminophen (TYLENOL) 500 MG tablet Take 500 mg by mouth daily as needed for moderate pain or headache.    . ALPRAZolam (XANAX) 0.5 MG tablet 1/2 - 1 tab by mouth twice per day as needed 40 tablet 0  . amiodarone (PACERONE) 100 MG tablet TAKE 1 TABLET BY MOUTH EVERY DAY 90 tablet 1  . Blood Glucose Monitoring Suppl (ACCU-CHEK NANO SMARTVIEW) W/DEVICE KIT Use to test blood sugar 4 times daily as instructed. Dx code: E11.59 1 kit 0  . carvedilol (COREG) 12.5 MG tablet Take 1.5 tablets (18.75 mg total) by mouth 2 (two) times daily with a meal. Please cancel all previous orders for current medication. Change in dosage or pill size. 90 tablet 6  . Cholecalciferol (VITAMIN D3) 2000 UNITS TABS Take 2,000 Units by mouth daily.     [name] dicyclomine (BENTYL) 10 MG capsule TAKE 1 CAPSULE (10 MG TOTAL) BY MOUTH 4 (FOUR) TIMES DAILY BEFORE MEALS AND AT BEDTIME. 120 capsule 1  . ELIQUIS 5 MG TABS tablet TAKE 1 TABLET BY MOUTH TWICE A DAY 60 tablet 11  . furosemide (LASIX) 20 MG tablet TAKE 1 TABLET BY MOUTH EVERY DAY (Patient taking differently: Take 20 mg by  mouth every other day. ) 30 tablet 3  . gabapentin (NEURONTIN) 400 MG capsule TAKE 1 CAPSULE (400 MG TOTAL) BY MOUTH 2 (TWO) TIMES DAILY. 180 capsule 1  . glucose blood (ACCU-CHEK SMARTVIEW) test strip Use to test blood sugar 4 times daily as instructed. Dx code E11.59 200 each 11  . insulin aspart (NOVOLOG) 100 UNIT/ML injection Inject 10-14 Units into the skin 3 (three) times daily before meals. 3 vial PRN  . Insulin Detemir (LEVEMIR FLEXTOUCH) 100 UNIT/ML Pen Inject 35 Units into the skin at bedtime. 15 pen 3  . levothyroxine (SYNTHROID, LEVOTHROID) 25 MCG tablet TAKE 1 TABLET (25 MCG TOTAL) BY MOUTH DAILY BEFORE BREAKFAST. 90 tablet 1  . magnesium oxide (MAG-OX) 400 MG tablet TAKE 1 TABLET (400 MG TOTAL) BY MOUTH 2 (TWO) TIMES DAILY. 60 tablet 2  . metFORMIN (GLUCOPHAGE-XR) 500 MG 24 hr tablet Take 2 tablets (1,000 mg total) by mouth daily with supper. 180 tablet 3  . Multiple Vitamins-Minerals (EYE VITAMINS PO) Take 1 tablet by mouth 2 (two) times daily.     . nitroGLYCERIN (NITROSTAT) 0.4 MG SL tablet Place 1 tablet (0.4 mg total) under the tongue every 5 (five) minutes as needed for chest pain. 25 tablet 1  . nizatidine (AXID) 150 MG capsule Take 1 capsule by mouth daily. Must keep 04/29/16 appt for future refills 30 capsule 0  . omeprazole (PRILOSEC) 20 MG capsule TAKE 1 CAPSULE BY MOUTH EVERY DAY (Patient taking differently: Take 20 mg by mouth daily. ) 30 capsule 5  . polyethylene glycol-electrolytes (NULYTELY/GOLYTELY) 420 g solution Take 4,000 mLs by mouth as directed. 4000 mL 0  . rosuvastatin (CRESTOR) 20 MG tablet Take 1 tablet (20 mg total) by mouth daily. (Patient taking differently: Take 20 mg by mouth at bedtime. ) 90 tablet 3  . sacubitril-valsartan (ENTRESTO) 97-103 MG Take 1 tablet by mouth 2 (two) times daily. 60 tablet 11  . sacubitril-valsartan (ENTRESTO) 97-103 MG Take 1 tablet by mouth 2 (two) times daily.    [name]  traMADol (ULTRAM) 50 MG tablet Take 1 tablet (50 mg total) by  mouth every 6 (six) hours as needed (pain). 120 tablet 2   No current facility-administered medications on file prior to visit.    Allergies  Allergen Reactions  . Veltassa [Patiromer] Diarrhea  . Darvon Other (See Comments)    indigestion  . Promethazine Hcl Other (See Comments)    hyperactivity  . Spironolactone Diarrhea and Nausea And Vomiting  . Plavix [Clopidogrel Bisulfate] Rash   Family History  Problem Relation Age of Onset  . Diabetes Mother   . Heart disease Mother   . Hyperlipidemia Mother   . Hypertension Mother   . Heart disease Father   . Heart attack Father   . Hypertension Father   . Early death Brother 7  . Breast cancer Sister   . Lung cancer Sister   . Irritable bowel syndrome Sister   . Colon cancer Maternal Aunt   . Esophageal cancer Neg Hx   . Colon polyps Neg Hx     PE: There were no vitals taken for this visit. There is no height or weight on file to calculate BMI.  Wt Readings from Last 3 Encounters:  05/30/18 199 lb 9.6 oz (90.5 kg)  05/20/18 199 lb 9.6 oz (90.5 kg)  02/25/18 191 lb (86.6 kg)   Constitutional:  in NAD  The physical exam was not performed (telephone visit).  ASSESSMENT: 1. DM2, insulin-dependent, controlled, with complications - CAD, ICM - s/p ICD placement - CKD - PN - on neurontin - gastroparesis per [facility]ES 06/18/2012 >> 60 minutes: 92%, 120 minutes: 82%  2. 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 HL  PLAN:  1. Patient with longstanding, previously uncontrolled type 2 diabetes, with improved control on a basal-bolus insulin regimen and also metformin.  She improved her diet before last visit and HbA1c returned 6.1% at that time.  We were able to decrease her Levemir dose but we also had to decrease the metformin due to her low GFR.  At that time, sugars were increasing throughout the day and we discussed about improving her her meals, especially dinner. -At this visit, sugars are more variable per her report, fluctuating from 100 to  approximately 180s before meals.  However, she is reporting sugars for the last 3 months, and her sugars were higher during the time when her son was sick.  Also, she occasionally has low blood sugar, rarely in the 50s, but occasionally in the 60s, if she is prolonging the distance between meals.  At next visit, we may need to decrease Levemir, but for now, I advised her to continue the same insulin doses. - I suggested to: Patient Instructions  Please continue: - Metformin XR 1000 mg with dinner - Levemir 35 units at night - Novolog:  - 10 units before a small meal - 14 units before a large meal  Please return in 4 months with your sugar log.   - we will check her HbA1c when she comes to the clinic - continue checking sugars at different times of the day - check 3x a day, rotating checks - advised for yearly eye exams >> she is not UTD - Return to clinic in 4 mo with sugar log    2. PN -Stable -Continues Neurontin without side effects  3. HL - Reviewed latest lipid panel from earlier this year, all fractions at goal Lab Results  Component Value Date   CHOL 145 05/20/2018   HDL 55 05/20/2018  LDLCALC 72 05/20/2018   TRIG 92 05/20/2018   CHOLHDL 2.6 05/20/2018  - Continues Crestor without side effects.  - time spent with the patient: 17 min, of which >50% was spent in obtaining information about her symptoms, reviewing her previous labs, evaluations, blood sugars, and treatments, counseling her about her condition (please see the discussed topics above), and developing a plan to further investigate and treat it.   Philemon Kingdom, MD PhD [facility] Endocrinology

## 2018-06-24 NOTE — Patient Instructions (Signed)
Please continue: - Metformin XR 1000 mg with dinner - Levemir 35 units at night - Novolog:  - 10 units before a small meal - 14 units before a large meal  Please return in 4 months with your sugar log.

## 2018-07-18 ENCOUNTER — Ambulatory Visit (INDEPENDENT_AMBULATORY_CARE_PROVIDER_SITE_OTHER): Payer: Medicare PPO | Admitting: *Deleted

## 2018-07-18 ENCOUNTER — Other Ambulatory Visit: Payer: Self-pay | Admitting: Internal Medicine

## 2018-07-18 ENCOUNTER — Other Ambulatory Visit: Payer: Self-pay

## 2018-07-18 DIAGNOSIS — I255 Ischemic cardiomyopathy: Secondary | ICD-10-CM

## 2018-07-18 DIAGNOSIS — I5022 Chronic systolic (congestive) heart failure: Secondary | ICD-10-CM

## 2018-07-18 LAB — CUP PACEART REMOTE DEVICE CHECK
Battery Remaining Longevity: 23 mo
Battery Remaining Percentage: 24 %
Battery Voltage: 2.8 V
Brady Statistic AP VP Percent: 1.7 %
Brady Statistic AP VS Percent: 92 %
Brady Statistic AS VP Percent: 1 %
Brady Statistic AS VS Percent: 5 %
Brady Statistic RA Percent Paced: 90 %
Brady Statistic RV Percent Paced: 2.4 %
Date Time Interrogation Session: 20200427063659
HighPow Impedance: 42 Ohm
Implantable Lead Implant Date: 20070117
Implantable Lead Implant Date: 20070117
Implantable Lead Location: 753859
Implantable Lead Location: 753860
Implantable Lead Model: 7040
Implantable Pulse Generator Implant Date: 20130212
Lead Channel Impedance Value: 460 Ohm
Lead Channel Impedance Value: 540 Ohm
Lead Channel Pacing Threshold Amplitude: 0.5 V
Lead Channel Pacing Threshold Amplitude: 1 V
Lead Channel Pacing Threshold Pulse Width: 0.5 ms
Lead Channel Pacing Threshold Pulse Width: 0.5 ms
Lead Channel Sensing Intrinsic Amplitude: 11.8 mV
Lead Channel Sensing Intrinsic Amplitude: 3.6 mV
Lead Channel Setting Pacing Amplitude: 2 V
Lead Channel Setting Pacing Amplitude: 2.5 V
Lead Channel Setting Pacing Pulse Width: 0.5 ms
Lead Channel Setting Sensing Sensitivity: 0.5 mV
Pulse Gen Serial Number: 819806

## 2018-07-22 ENCOUNTER — Other Ambulatory Visit: Payer: Self-pay | Admitting: Internal Medicine

## 2018-07-24 ENCOUNTER — Other Ambulatory Visit (HOSPITAL_COMMUNITY): Payer: Self-pay | Admitting: Cardiology

## 2018-07-26 NOTE — Progress Notes (Signed)
Remote ICD transmission.   

## 2018-07-29 ENCOUNTER — Ambulatory Visit (INDEPENDENT_AMBULATORY_CARE_PROVIDER_SITE_OTHER): Payer: Medicare PPO | Admitting: Internal Medicine

## 2018-07-29 ENCOUNTER — Ambulatory Visit: Payer: Medicare PPO | Admitting: Internal Medicine

## 2018-07-29 DIAGNOSIS — J309 Allergic rhinitis, unspecified: Secondary | ICD-10-CM | POA: Diagnosis not present

## 2018-07-29 DIAGNOSIS — I10 Essential (primary) hypertension: Secondary | ICD-10-CM

## 2018-07-29 DIAGNOSIS — Z0001 Encounter for general adult medical examination with abnormal findings: Secondary | ICD-10-CM

## 2018-07-29 DIAGNOSIS — E785 Hyperlipidemia, unspecified: Secondary | ICD-10-CM | POA: Diagnosis not present

## 2018-07-29 DIAGNOSIS — E1165 Type 2 diabetes mellitus with hyperglycemia: Secondary | ICD-10-CM | POA: Diagnosis not present

## 2018-07-29 DIAGNOSIS — IMO0002 Reserved for concepts with insufficient information to code with codable children: Secondary | ICD-10-CM

## 2018-07-29 DIAGNOSIS — E1151 Type 2 diabetes mellitus with diabetic peripheral angiopathy without gangrene: Secondary | ICD-10-CM | POA: Diagnosis not present

## 2018-07-29 DIAGNOSIS — E039 Hypothyroidism, unspecified: Secondary | ICD-10-CM | POA: Diagnosis not present

## 2018-07-29 DIAGNOSIS — N183 Chronic kidney disease, stage 3 unspecified: Secondary | ICD-10-CM

## 2018-07-29 MED ORDER — TRAMADOL HCL 50 MG PO TABS: 50.0000 mg | ORAL_TABLET | Freq: Four times a day (QID) | ORAL | 2 refills | Status: DC | PRN

## 2018-07-29 MED ORDER — TRIAMCINOLONE ACETONIDE 55 MCG/ACT NA AERO: 2.0000 | INHALATION_SPRAY | Freq: Every day | NASAL | 12 refills | Status: DC

## 2018-07-29 NOTE — Progress Notes (Signed)
Patient ID: [name], female   DOB: November 14, 1945, 73 y.o.   MRN: [id number]  Virtual Visit via Video Note but patient video failed, so phone only  I connected with [name] on 07/29/18 at 10:20 AM EDT by a video enabled telemedicine application and verified that I am speaking with the correct person using two identifiers.  Location: Patient: at home Provider: at office   I discussed the limitations of evaluation and management by telemedicine and the availability of in person appointments. The patient expressed understanding and agreed to proceed.  History of Present Illness: Here for wellness and f/u;  Overall doing ok;  Pt denies Chest pain, worsening SOB, DOE, wheezing, orthopnea, PND, worsening LE edema, palpitations, dizziness or syncope.  Pt denies neurological change such as new headache, facial or extremity weakness.  Pt denies polydipsia, polyuria, or low sugar symptoms. Pt states overall good compliance with treatment and medications, good tolerability, and has been trying to follow appropriate diet. No fever, night sweats, wt loss, loss of appetite, or other constitutional symptoms.  Pt states good ability with ADL's, has low fall risk, home safety reviewed and adequate, no other significant changes in hearing or vision, and only occasionally active with exercise, Follow closely for DM with endo, due for optho f/u.  Also due to ongoing chronic bilat knee pain, not better after cortisone and gel shots.  Has recent grief over son died with covid19 like illness but not confirmed, on mar 14; Denies worsening depressive symptoms, suicidal ideation, or panic.  Does have several wks ongoing nasal allergy symptoms with clearish congestion, itch and sneezing, without fever, pain, ST, cough, swelling or wheezing.   Past Medical History:  Diagnosis Date  . Allergy   . Arthritis   . Atrial fibrillation ([place])    controlled with amiodarone, on coumadin  . Chronic renal insufficiency    . Chronic systolic heart failure ([place])   . Congestive heart failure ([place])   . Coronary artery disease 01/31/2011  . Diabetes mellitus    type 1  . 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   . History of chicken pox   . Hyperlipidemia   . Hypertension   . Ischemic cardiomyopathy   . Lumbar spondylosis 01/11/2012  . Sleep apnea   . Stroke [facility]) 2010   eye doctor said she had TIA  . Ventricular tachycardia ([place])    Polymorphic   Past Surgical History:  Procedure Laterality Date  . ABDOMINAL HYSTERECTOMY  1995  . CARDIAC DEFIBRILLATOR PLACEMENT    . CHOLECYSTECTOMY  1985  . COLONOSCOPY WITH PROPOFOL N/A 02/24/2018   Procedure: COLONOSCOPY WITH PROPOFOL;  Surgeon: Milus Banister, MD;  Location: [facility];  Service: Endoscopy;  Laterality: N/A;  . ICD  2003/2007   implanted by Dr Rollene Fare, most recent generator change 2/13 by Dr Rayann Heman, Analyze ST study patient  . IMPLANTABLE CARDIOVERTER DEFIBRILLATOR (ICD) GENERATOR CHANGE N/A 05/05/2011   Procedure: ICD GENERATOR CHANGE;  Surgeon: Thompson Grayer, MD;  Location: [facility] CATH LAB;  Service: Cardiovascular;  Laterality: N/A;  . LEFT HEART CATHETERIZATION WITH CORONARY ANGIOGRAM N/A 01/30/2011   Procedure: LEFT HEART CATHETERIZATION WITH CORONARY ANGIOGRAM;  Surgeon: Larey Dresser, MD;  Location: [facility] CATH LAB;  Service: Cardiovascular;  Laterality: N/A;  . POLYPECTOMY  02/24/2018   Procedure: POLYPECTOMY;  Surgeon: Milus Banister, MD;  Location: [facility]OSCOPY;  Service: Endoscopy;;  . [place]  1980    reports that she quit smoking about 22 years ago. She  has never used smokeless tobacco. She reports that she does not drink alcohol or use drugs. family history includes Breast cancer in her sister; Colon cancer in her maternal aunt; Diabetes in her mother; Early death (age of onset: 65) in her brother; Heart attack in her father; Heart disease in her father and mother; Hyperlipidemia in her mother; Hypertension in her father and  mother; Irritable bowel syndrome in her sister; Lung cancer in her sister. Allergies  Allergen Reactions  . Veltassa [Patiromer] Diarrhea  . Darvon Other (See Comments)    indigestion  . Promethazine Hcl Other (See Comments)    hyperactivity  . Spironolactone Diarrhea and Nausea And Vomiting  . Plavix [Clopidogrel Bisulfate] Rash   Current Outpatient Medications on File Prior to Visit  Medication Sig Dispense Refill  . acetaminophen (TYLENOL) 500 MG tablet Take 500 mg by mouth daily as needed for moderate pain or headache.    . ALPRAZolam (XANAX) 0.5 MG tablet 1/2 - 1 tab by mouth twice per day as needed 40 tablet 0  . amiodarone (PACERONE) 100 MG tablet TAKE 1 TABLET BY MOUTH EVERY DAY 90 tablet 1  . Blood Glucose Monitoring Suppl (ACCU-CHEK NANO SMARTVIEW) W/DEVICE KIT Use to test blood sugar 4 times daily as instructed. Dx code: E11.59 1 kit 0  . carvedilol (COREG) 12.5 MG tablet Take 1.5 tablets (18.75 mg total) by mouth 2 (two) times daily with a meal. Please cancel all previous orders for current medication. Change in dosage or pill size. 90 tablet 6  . Cholecalciferol (VITAMIN D3) 2000 UNITS TABS Take 2,000 Units by mouth daily.     [name] dicyclomine (BENTYL) 10 MG capsule TAKE 1 CAPSULE (10 MG TOTAL) BY MOUTH 4 (FOUR) TIMES DAILY BEFORE MEALS AND AT BEDTIME. 120 capsule 1  . ELIQUIS 5 MG TABS tablet TAKE 1 TABLET BY MOUTH TWICE A DAY 60 tablet 11  . furosemide (LASIX) 20 MG tablet TAKE 1 TABLET BY MOUTH EVERY DAY 90 tablet 1  . gabapentin (NEURONTIN) 400 MG capsule TAKE 1 CAPSULE (400 MG TOTAL) BY MOUTH 2 (TWO) TIMES DAILY. 180 capsule 1  . glucose blood (ACCU-CHEK SMARTVIEW) test strip Use to test blood sugar 4 times daily as instructed. Dx code E11.59 200 each 11  . insulin aspart (NOVOLOG) 100 UNIT/ML injection Inject 10-14 Units into the skin 3 (three) times daily before meals. 3 vial PRN  . Insulin Detemir (LEVEMIR FLEXTOUCH) 100 UNIT/ML Pen Inject 35 Units into the skin at bedtime.  15 pen 3  . levothyroxine (SYNTHROID, LEVOTHROID) 25 MCG tablet TAKE 1 TABLET (25 MCG TOTAL) BY MOUTH DAILY BEFORE BREAKFAST. 90 tablet 1  . magnesium oxide (MAG-OX) 400 MG tablet TAKE 1 TABLET BY MOUTH TWICE A DAY 60 tablet 2  . metFORMIN (GLUCOPHAGE-XR) 500 MG 24 hr tablet Take 2 tablets (1,000 mg total) by mouth daily with supper. 180 tablet 3  . Multiple Vitamins-Minerals (EYE VITAMINS PO) Take 1 tablet by mouth 2 (two) times daily.     . nitroGLYCERIN (NITROSTAT) 0.4 MG SL tablet Place 1 tablet (0.4 mg total) under the tongue every 5 (five) minutes as needed for chest pain. 25 tablet 1  . nizatidine (AXID) 150 MG capsule Take 1 capsule by mouth daily. Must keep 04/29/16 appt for future refills 30 capsule 0  . omeprazole (PRILOSEC) 20 MG capsule TAKE 1 CAPSULE BY MOUTH EVERY DAY (Patient taking differently: Take 20 mg by mouth daily. ) 30 capsule 5  . polyethylene glycol-electrolytes (NULYTELY/GOLYTELY) 420 g  solution Take 4,000 mLs by mouth as directed. 4000 mL 0  . rosuvastatin (CRESTOR) 20 MG tablet Take 1 tablet (20 mg total) by mouth daily. (Patient taking differently: Take 20 mg by mouth at bedtime. ) 90 tablet 3  . sacubitril-valsartan (ENTRESTO) 97-103 MG Take 1 tablet by mouth 2 (two) times daily. 60 tablet 11  . sacubitril-valsartan (ENTRESTO) 97-103 MG Take 1 tablet by mouth 2 (two) times daily.     No current facility-administered medications on file prior to visit.    Observations/Objective: Unable due to phone only Lab Results  Component Value Date   WBC 7.8 05/20/2018   HGB 12.4 05/20/2018   HCT 41.3 05/20/2018   PLT 192 05/20/2018   GLUCOSE 123 (H) 05/20/2018   CHOL 145 05/20/2018   TRIG 92 05/20/2018   HDL 55 05/20/2018   LDLCALC 72 05/20/2018   ALT 10 05/20/2018   AST 16 05/20/2018   NA 139 05/20/2018   K 4.4 05/20/2018   CL 108 05/20/2018   CREATININE 1.40 (H) 05/20/2018   BUN 22 05/20/2018   CO2 21 (L) 05/20/2018   TSH 4.468 08/02/2017   INR 1.61 04/27/2017    HGBA1C 6.1 (A) 02/25/2018   MICROALBUR 6.4 (H) 04/08/2017   Assessment and Plan: See notes  Follow Up Instructions: See notes   I discussed the assessment and treatment plan with the patient. The patient was provided an opportunity to ask questions and all were answered. The patient agreed with the plan and demonstrated an understanding of the instructions.   The patient was advised to call back or seek an in-person evaluation if the symptoms worsen or if the condition fails to improve as anticipated.  Cathlean Cower, MD

## 2018-07-29 NOTE — Patient Instructions (Signed)
Please take all new medication as prescribed - the nasacort for allergies  Please continue all other medications as before, and refills have been done if requested - the tramadol  Please have the pharmacy call with any other refills you may need.  Please continue your efforts at being more active, low cholesterol diet, and weight control.  You are otherwise up to date with prevention measures today.  Please keep your appointments with your specialists as you may have planned  Please go to the LAB in the Basement (turn left off the elevator) for the tests to be done at your convenience  You will be contacted by phone if any changes need to be made immediately.  Otherwise, you will receive a letter about your results with an explanation, but please check with MyChart first.  Please return in 6 months, or sooner if needed

## 2018-07-30 ENCOUNTER — Encounter: Payer: Self-pay | Admitting: Internal Medicine

## 2018-07-30 DIAGNOSIS — Z0001 Encounter for general adult medical examination with abnormal findings: Secondary | ICD-10-CM | POA: Insufficient documentation

## 2018-07-30 NOTE — Assessment & Plan Note (Signed)
stable overall by history and exam, recent data reviewed with pt, and pt to continue medical treatment as before,  to f/u any worsening symptoms or concerns, pt requests a1c, to f/u with endo as planned

## 2018-07-30 NOTE — Assessment & Plan Note (Signed)
Encouraged to check BP at home and next visit

## 2018-07-30 NOTE — Assessment & Plan Note (Signed)

## 2018-07-30 NOTE — Assessment & Plan Note (Signed)
stable overall by history and exam, recent data reviewed with pt, and pt to continue medical treatment as before,  to f/u any worsening symptoms or concerns  

## 2018-07-30 NOTE — Assessment & Plan Note (Addendum)
Mild to mod, for nasacort asd,,  to f/u any worsening symptoms or concerns  In addition to the time spent performing CPE, I spent an additional 25 minutes face to face,in which greater than 50% of this time was spent in counseling and coordination of care for patient's acute illness as documented, including the differential dx, treatment, further evaluation and other management of allergic rhinitis, DM, hypothyroid, HLD, HTN, CKD

## 2018-08-04 ENCOUNTER — Other Ambulatory Visit (HOSPITAL_COMMUNITY): Payer: Self-pay | Admitting: Cardiology

## 2018-08-11 ENCOUNTER — Telehealth: Payer: Self-pay

## 2018-08-11 NOTE — Telephone Encounter (Signed)
Copied from [place] [id number]. Topic: General - Call Back - No Documentation >> Aug 11, 2018 11:54 AM Erick Blinks wrote: Reason for CRM: Cierra from [employer] requesting the authorization for an Echocardiogram that is scheduled on may 26th 2020. Please advise   Best Contact: [phone] extension [phone]

## 2018-08-16 ENCOUNTER — Other Ambulatory Visit (INDEPENDENT_AMBULATORY_CARE_PROVIDER_SITE_OTHER): Payer: Medicare PPO

## 2018-08-16 ENCOUNTER — Other Ambulatory Visit: Payer: Self-pay

## 2018-08-16 ENCOUNTER — Encounter (HOSPITAL_COMMUNITY): Payer: Self-pay | Admitting: Cardiology

## 2018-08-16 ENCOUNTER — Telehealth (HOSPITAL_COMMUNITY): Payer: Self-pay

## 2018-08-16 ENCOUNTER — Ambulatory Visit (HOSPITAL_BASED_OUTPATIENT_CLINIC_OR_DEPARTMENT_OTHER)
Admission: RE | Admit: 2018-08-16 | Discharge: 2018-08-16 | Disposition: A | Discharge status: Home / Self Care | Payer: Medicare PPO | Source: Ambulatory Visit | Attending: Cardiology | Admitting: Cardiology

## 2018-08-16 ENCOUNTER — Ambulatory Visit (HOSPITAL_COMMUNITY)
Admission: RE | Admit: 2018-08-16 | Discharge: 2018-08-16 | Disposition: A | Discharge status: Home / Self Care | Payer: Medicare PPO | Source: Ambulatory Visit | Attending: Cardiology | Admitting: Cardiology

## 2018-08-16 ENCOUNTER — Other Ambulatory Visit: Payer: Self-pay | Admitting: Internal Medicine

## 2018-08-16 VITALS — BP 126/70 | HR 65 | Wt 204.6 lb

## 2018-08-16 DIAGNOSIS — I480 Paroxysmal atrial fibrillation: Secondary | ICD-10-CM | POA: Diagnosis not present

## 2018-08-16 DIAGNOSIS — I255 Ischemic cardiomyopathy: Secondary | ICD-10-CM | POA: Diagnosis not present

## 2018-08-16 DIAGNOSIS — Z8673 Personal history of transient ischemic attack (TIA), and cerebral infarction without residual deficits: Secondary | ICD-10-CM | POA: Insufficient documentation

## 2018-08-16 DIAGNOSIS — I472 Ventricular tachycardia: Secondary | ICD-10-CM | POA: Diagnosis not present

## 2018-08-16 DIAGNOSIS — I2510 Atherosclerotic heart disease of native coronary artery without angina pectoris: Secondary | ICD-10-CM | POA: Insufficient documentation

## 2018-08-16 DIAGNOSIS — Z955 Presence of coronary angioplasty implant and graft: Secondary | ICD-10-CM | POA: Insufficient documentation

## 2018-08-16 DIAGNOSIS — Z79899 Other long term (current) drug therapy: Secondary | ICD-10-CM | POA: Insufficient documentation

## 2018-08-16 DIAGNOSIS — N183 Chronic kidney disease, stage 3 (moderate): Secondary | ICD-10-CM | POA: Diagnosis not present

## 2018-08-16 DIAGNOSIS — E1143 Type 2 diabetes mellitus with diabetic autonomic (poly)neuropathy: Secondary | ICD-10-CM | POA: Insufficient documentation

## 2018-08-16 DIAGNOSIS — Z8249 Family history of ischemic heart disease and other diseases of the circulatory system: Secondary | ICD-10-CM | POA: Diagnosis not present

## 2018-08-16 DIAGNOSIS — E039 Hypothyroidism, unspecified: Secondary | ICD-10-CM

## 2018-08-16 DIAGNOSIS — Z794 Long term (current) use of insulin: Secondary | ICD-10-CM | POA: Insufficient documentation

## 2018-08-16 DIAGNOSIS — E1142 Type 2 diabetes mellitus with diabetic polyneuropathy: Secondary | ICD-10-CM | POA: Diagnosis not present

## 2018-08-16 DIAGNOSIS — K3184 Gastroparesis: Secondary | ICD-10-CM | POA: Insufficient documentation

## 2018-08-16 DIAGNOSIS — Z87891 Personal history of nicotine dependence: Secondary | ICD-10-CM | POA: Diagnosis not present

## 2018-08-16 DIAGNOSIS — Z7901 Long term (current) use of anticoagulants: Secondary | ICD-10-CM | POA: Diagnosis not present

## 2018-08-16 DIAGNOSIS — E1122 Type 2 diabetes mellitus with diabetic chronic kidney disease: Secondary | ICD-10-CM | POA: Diagnosis not present

## 2018-08-16 DIAGNOSIS — E1151 Type 2 diabetes mellitus with diabetic peripheral angiopathy without gangrene: Secondary | ICD-10-CM

## 2018-08-16 DIAGNOSIS — E1165 Type 2 diabetes mellitus with hyperglycemia: Secondary | ICD-10-CM | POA: Diagnosis not present

## 2018-08-16 DIAGNOSIS — I5022 Chronic systolic (congestive) heart failure: Secondary | ICD-10-CM | POA: Insufficient documentation

## 2018-08-16 DIAGNOSIS — I130 Hypertensive heart and chronic kidney disease with heart failure and stage 1 through stage 4 chronic kidney disease, or unspecified chronic kidney disease: Secondary | ICD-10-CM | POA: Insufficient documentation

## 2018-08-16 DIAGNOSIS — Z95810 Presence of automatic (implantable) cardiac defibrillator: Secondary | ICD-10-CM | POA: Insufficient documentation

## 2018-08-16 DIAGNOSIS — Z79890 Hormone replacement therapy (postmenopausal): Secondary | ICD-10-CM | POA: Diagnosis not present

## 2018-08-16 DIAGNOSIS — IMO0002 Reserved for concepts with insufficient information to code with codable children: Secondary | ICD-10-CM

## 2018-08-16 LAB — URINALYSIS, ROUTINE W REFLEX MICROSCOPIC
Bilirubin Urine: NEGATIVE
Hgb urine dipstick: NEGATIVE
Ketones, ur: NEGATIVE
Nitrite: NEGATIVE
RBC / HPF: NONE SEEN (ref 0–?)
Specific Gravity, Urine: 1.015 (ref 1.000–1.030)
Total Protein, Urine: NEGATIVE
Urine Glucose: NEGATIVE
Urobilinogen, UA: 1 (ref 0.0–1.0)
pH: 7.5 (ref 5.0–8.0)

## 2018-08-16 LAB — BASIC METABOLIC PANEL
BUN: 34 mg/dL — ABNORMAL HIGH (ref 6–23)
CO2: 25 mEq/L (ref 19–32)
Calcium: 9.1 mg/dL (ref 8.4–10.5)
Chloride: 106 mEq/L (ref 96–112)
Creatinine, Ser: 1.69 mg/dL — ABNORMAL HIGH (ref 0.40–1.20)
GFR: 29.79 mL/min — ABNORMAL LOW (ref >60.00)
Glucose, Bld: 80 mg/dL (ref 70–99)
Potassium: 4.8 mEq/L (ref 3.5–5.1)
Sodium: 142 mEq/L (ref 135–145)

## 2018-08-16 LAB — CBC WITH DIFFERENTIAL/PLATELET
Basophils Absolute: 0 10*3/uL (ref 0.0–0.1)
Basophils Relative: 0.5 % (ref 0.0–3.0)
Eosinophils Absolute: 0.1 10*3/uL (ref 0.0–0.7)
Eosinophils Relative: 1.6 % (ref 0.0–5.0)
HCT: 36 % (ref 36.0–46.0)
Hemoglobin: 12.2 g/dL (ref 12.0–15.0)
Lymphocytes Relative: 22.2 % (ref 12.0–46.0)
Lymphs Abs: 1.3 10*3/uL (ref 0.7–4.0)
MCHC: 33.9 g/dL (ref 30.0–36.0)
MCV: 91.6 fl (ref 78.0–100.0)
Monocytes Absolute: 0.4 10*3/uL (ref 0.1–1.0)
Monocytes Relative: 7.5 % (ref 3.0–12.0)
Neutro Abs: 4 10*3/uL (ref 1.4–7.7)
Neutrophils Relative %: 68.2 % (ref 43.0–77.0)
Platelets: 183 10*3/uL (ref 150.0–400.0)
RBC: 3.93 Mil/uL (ref 3.87–5.11)
RDW: 14.2 % (ref 11.5–15.5)
WBC: 5.9 10*3/uL (ref 4.0–10.5)

## 2018-08-16 LAB — TSH
TSH: 3.272 u[IU]/mL (ref 0.350–4.500)
TSH: 3.02 u[IU]/mL (ref 0.35–4.50)

## 2018-08-16 LAB — COMPREHENSIVE METABOLIC PANEL
ALT: 10 U/L (ref 0–44)
AST: 13 U/L — ABNORMAL LOW (ref 15–41)
Albumin: 3.5 g/dL (ref 3.5–5.0)
Alkaline Phosphatase: 78 U/L (ref 38–126)
Anion gap: 11 (ref 5–15)
BUN: 34 mg/dL — ABNORMAL HIGH (ref 8–23)
CO2: 23 mmol/L (ref 22–32)
Calcium: 9 mg/dL (ref 8.9–10.3)
Chloride: 108 mmol/L (ref 98–111)
Creatinine, Ser: 1.79 mg/dL — ABNORMAL HIGH (ref 0.44–1.00)
GFR calc Af Amer: 32 mL/min — ABNORMAL LOW (ref >60)
GFR calc non Af Amer: 28 mL/min — ABNORMAL LOW (ref >60)
Glucose, Bld: 99 mg/dL (ref 70–99)
Potassium: 4.9 mmol/L (ref 3.5–5.1)
Sodium: 142 mmol/L (ref 135–145)
Total Bilirubin: 0.7 mg/dL (ref 0.3–1.2)
Total Protein: 6.5 g/dL (ref 6.5–8.1)

## 2018-08-16 LAB — CBC
HCT: 37.3 % (ref 36.0–46.0)
Hemoglobin: 11.6 g/dL — ABNORMAL LOW (ref 12.0–15.0)
MCH: 29.7 pg (ref 26.0–34.0)
MCHC: 31.1 g/dL (ref 30.0–36.0)
MCV: 95.6 fL (ref 80.0–100.0)
Platelets: 187 10*3/uL (ref 150–400)
RBC: 3.9 MIL/uL (ref 3.87–5.11)
RDW: 13.5 % (ref 11.5–15.5)
WBC: 5.4 10*3/uL (ref 4.0–10.5)
nRBC: 0 % (ref 0.0–0.2)

## 2018-08-16 LAB — LIPID PANEL
Cholesterol: 147 mg/dL (ref 0–200)
HDL: 49.8 mg/dL (ref >39.00)
LDL Cholesterol: 81 mg/dL (ref 0–99)
NonHDL: 97.61
Total CHOL/HDL Ratio: 3
Triglycerides: 85 mg/dL (ref 0.0–149.0)
VLDL: 17 mg/dL (ref 0.0–40.0)

## 2018-08-16 LAB — MICROALBUMIN / CREATININE URINE RATIO
Creatinine,U: 102.1 mg/dL
Microalb Creat Ratio: 5.3 mg/g (ref 0.0–30.0)
Microalb, Ur: 5.4 mg/dL — ABNORMAL HIGH (ref 0.0–1.9)

## 2018-08-16 LAB — HEMOGLOBIN A1C: Hgb A1c MFr Bld: 7.6 % — ABNORMAL HIGH (ref 4.6–6.5)

## 2018-08-16 LAB — T4, FREE: Free T4: 1.06 ng/dL (ref 0.60–1.60)

## 2018-08-16 MED ORDER — EPLERENONE 25 MG PO TABS: 25.0000 mg | ORAL_TABLET | Freq: Every day | ORAL | 1 refills | Status: DC

## 2018-08-16 MED ORDER — NITROGLYCERIN 0.4 MG SL SUBL: 0.4000 mg | SUBLINGUAL_TABLET | SUBLINGUAL | 1 refills | Status: DC | PRN

## 2018-08-16 NOTE — Progress Notes (Signed)
Patient ID: [name], female   DOB: 12/22/1945, 73 y.o.   MRN: [id number] PCP: Dr Jenny Reichmann Cardiology: Dr. Aundra Dubin  73 y.o. with history of CAD, ischemic cardiomyopathy, and atrial fibrillation presents for cardiology followup.  Patient was hospitalized in 11/12 at [facility] for VT with ICD discharge.  She had a left heart cath showing patent stents.  EF was 35-40% by echo.  She is on amiodarone.  She has had periodic problems with creatinine rising with medication adjustments.  Echo in 12/18 showed EF 35-40%, inferior and septal hypokinesis, mild MR.    Echo was done today and reviewed, EF 40%, normal RV, mild AI.   She returns today for followup of CHF. Weight is up about 5 lbs but due to coronavirus, she has not been out much and has not exercised.  Breathing is the same, no dyspnea on flat ground but gets a little short of breath with stairs or an incline.  No chest pain.  No palpitations.  No BRBPR/melena.    ECG: a-paced, inferolateral TWIs (personally reviewed).   Labs (10/12): LDL 73, HDL 44 Labs (11/12): K 3.9, creatinine 1.1, LFTs normal, TSH normal Labs (12/12): K 3.9, creatinine 1.5, proBNP 18 Labs (1/13): LDL 82, HDL 57 Labs (2/13): K 4.3, creatinine 1.5 Labs (5/13): creatinine 2.4 => 1.7, LFTs normal, TSH normal, proBNP 18 Labs (6/13): K 4.2, creatinine 1.7 Labs (10/13): K 4.3, creatinine 1.4 Labs (4/14); LFTs normal, TSH normal, LDL 71, HDL 58 Labs (5/14): K 4.5, creatinine 1.4 Labs (8/14): TSH normal, LFTs normal Labs (9/14): K 4.2, creatinine 1.8, LDL 75, HDL 54 Labs (07/31/13) K 3.7 Creatinine 1.8 BNP 148  Labs (9/15): K 4.3, creatinine 1.24, LFTs normal, LDL 81, HDL 58, TSH normal Labs (9/16): K 4.4, creatinine 1.44, LDL 64, HDL 54, LFTs normal, TSH normal, HCT 38 Labs (2/17): K 4.9, creatinine 1.76, HCT 38.6 Labs (1/19): LDL 71, HDL 58, K 4.8, creatinine 1.26, LFTs normal, hgb 12.9.  Labs (2/19): Creatinine 1.35, K 4.9 Labs (6/19): K 4.4, creatinine 1.57 Labs  (8/19): K 5, creatinine 1.59, LFTs normal Labs (2/20): K 4.4, creatinine 1.4, LDL 72, HDL 55, normal LFTs  PMH: 1. Diabetes mellitus 2. CVA, TIA in 2010 3. HTN 4. CKD stage 3 5. H/o TAH 6. H/o CCY 7. Sciatica 8. Atrial fibrillation 9. CAD: s/p LAD and RCA PCI.  Last LHC in 11/12 with patent proximal LAD stent, ostial 70% D1 (jailed by stent), mild LAD stent patent, patent RCA stents, EF 40% with global hypokinesis.  10. Ischemic cardiomyopathy: Echo (10/12): EF 35-40%, moderate focal basal septal hypertrophy, inferior akinesis, grade I diastolic dysfunction, moderate aortic insufficiency. St Jude dual chamber ICD.  Spironolactone stopped when creatinine rose to 2.5.  Echo (5/14) with EF 40-45%, mild AI.  Echo (6/16) with EF 40-45%, inferior/inferoseptal hypokinesis, mild LVH.  - Echo (12/18): EF 35-40%, inferior and septal hypokinesis, mild MR, mild AI.  - Echo (5/20): EF 40%, inferior and inferoseptal hypokinesis, normal RV size and systolic function, mild AI, normal IVC.  11. Aortic insufficiency: moderate in past but mild on most recent echo.   12. 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  13. Gastroparesis 14. Hypothyroidism  SH: Divorced.  3 children.  Quit smoking in 1997. Lives in [place] now with daughter.  [place].   FH: CAD  ROS: All systems reviewed and negative except as per HPI.   Current Outpatient Medications  Medication Sig Dispense Refill  . acetaminophen (TYLENOL) 500 MG tablet Take  500 mg by mouth daily as needed for moderate pain or headache.    . ALPRAZolam (XANAX) 0.5 MG tablet 1/2 - 1 tab by mouth twice per day as needed 40 tablet 0  . amiodarone (PACERONE) 100 MG tablet TAKE 1 TABLET BY MOUTH EVERY DAY 90 tablet 1  . Blood Glucose Monitoring Suppl (ACCU-CHEK NANO SMARTVIEW) W/DEVICE KIT Use to test blood sugar 4 times daily as instructed. Dx code: E11.59 1 kit 0  . carvedilol (COREG) 12.5 MG tablet Take 1.5 tablets (18.75 mg total) by mouth 2 (two) times daily  with a meal. Please cancel all previous orders for current medication. Change in dosage or pill size. 90 tablet 6  . Cholecalciferol (VITAMIN D3) 2000 UNITS TABS Take 2,000 Units by mouth daily.     [name] ELIQUIS 5 MG TABS tablet TAKE 1 TABLET BY MOUTH TWICE A DAY 60 tablet 11  . furosemide (LASIX) 20 MG tablet TAKE 1 TABLET BY MOUTH EVERY DAY (Patient taking differently: every other day. ) 90 tablet 1  . gabapentin (NEURONTIN) 400 MG capsule TAKE 1 CAPSULE (400 MG TOTAL) BY MOUTH 2 (TWO) TIMES DAILY. 180 capsule 1  . glucose blood (ACCU-CHEK SMARTVIEW) test strip Use to test blood sugar 4 times daily as instructed. Dx code E11.59 200 each 11  . insulin aspart (NOVOLOG) 100 UNIT/ML injection Inject 10-14 Units into the skin 3 (three) times daily before meals. 3 vial PRN  . Insulin Detemir (LEVEMIR FLEXTOUCH) 100 UNIT/ML Pen Inject 35 Units into the skin at bedtime. 15 pen 3  . levothyroxine (SYNTHROID, LEVOTHROID) 25 MCG tablet TAKE 1 TABLET (25 MCG TOTAL) BY MOUTH DAILY BEFORE BREAKFAST. 90 tablet 1  . magnesium oxide (MAG-OX) 400 MG tablet TAKE 1 TABLET BY MOUTH TWICE A DAY 60 tablet 2  . metFORMIN (GLUCOPHAGE-XR) 500 MG 24 hr tablet Take 2 tablets (1,000 mg total) by mouth daily with supper. 180 tablet 3  . Multiple Vitamins-Minerals (EYE VITAMINS PO) Take 1 tablet by mouth 2 (two) times daily.     . nitroGLYCERIN (NITROSTAT) 0.4 MG SL tablet Place 1 tablet (0.4 mg total) under the tongue every 5 (five) minutes as needed for chest pain. 25 tablet 1  . nizatidine (AXID) 150 MG capsule Take 1 capsule by mouth daily. Must keep 04/29/16 appt for future refills 30 capsule 0  . omeprazole (PRILOSEC) 20 MG capsule TAKE 1 CAPSULE BY MOUTH EVERY DAY (Patient taking differently: Take 20 mg by mouth daily. ) 30 capsule 5  . rosuvastatin (CRESTOR) 20 MG tablet TAKE 1 TABLET BY MOUTH EVERY DAY 90 tablet 3  . sacubitril-valsartan (ENTRESTO) 97-103 MG Take 1 tablet by mouth 2 (two) times daily. 60 tablet 11  .  traMADol (ULTRAM) 50 MG tablet Take 1 tablet (50 mg total) by mouth every 6 (six) hours as needed (pain). 120 tablet 2  . triamcinolone (NASACORT) 55 MCG/ACT AERO nasal inhaler Place 2 sprays into the nose daily. 1 Inhaler 12  . dicyclomine (BENTYL) 10 MG capsule TAKE 1 CAPSULE (10 MG TOTAL) BY MOUTH 4 (FOUR) TIMES DAILY BEFORE MEALS AND AT BEDTIME. 120 capsule 1  . eplerenone (INSPRA) 25 MG tablet Take 1 tablet (25 mg total) by mouth daily. 90 tablet 1   No current facility-administered medications for this encounter.    Filed Weights   08/16/18 1159  Weight: 92.8 kg (204 lb 9.6 oz)    BP 126/70   Pulse 65   Wt 92.8 kg (204 lb 9.6 oz)  SpO2 97%   BMI 36.24 kg/m  General: NAD Neck: No JVD, no thyromegaly or thyroid nodule.  Lungs: Clear to auscultation bilaterally with normal respiratory effort. CV: Nondisplaced PMI.  Heart regular S1/S2, no S3/S4, 1/6 SEM RUSB.  Trace ankle edema.  No carotid bruit.  Normal pedal pulses.  Abdomen: Soft, nontender, no hepatosplenomegaly, no distention.  Skin: Intact without lesions or rashes.  Neurologic: Alert and oriented x 3.  Psych: Normal affect. Extremities: No clubbing or cyanosis.  HEENT: Normal.   Assessment/Plan 1. Atrial fibrillation: Paroxysmal.  A-paced today.  - Continue amiodarone 100 mg daily.  Check LFTs today. She is on Levoxyl followed by PCP. She will need regular eye exams.  - Continue Eliquis 5 mg bid. CBC today.   2. Coronary artery disease: No chest pain.  Nonobstructive disease on last cath.  - Continue Crestor 20 mg daily.  Good lipids in 2/20.  3. Ventricular tachycardia - On amiodarone 100 mg daily with suppression of VT. 4. Chronic systolic CHF: Ischemic cardiomyopathy. NYHA class II symptoms, she is not volume overloaded on exam.  EF 40% on echo today (personally reviewed). She has a [employer] officer, political party ICD.  - Continue Entresto 97/103 mg BID  - Contiue Coreg 18.75 mg bid.  - Continue Lasix 20 mg qod.  BMET today.  - Start  eplerenone 25 mg daily today (did not tolerate spironolactone in the past).  Will need to follow K closely, check today and again in 10 days.  5. CKD stage 3: BMET today.   6. Hypothyroidism: Managed by PCP.   Followup in 3 months   Loralie Champagne 08/16/2018

## 2018-08-16 NOTE — Patient Instructions (Signed)
EKG was completed today.  Labs were done today. We will call you with any ABNORMAL results. No news is good news!  Please follow up with repeat labs in 10 days.  BEGIN taking Eplerenone 25 mg daily, this and your Nitro refill have been sent to your pharmacy.  Your physician recommends that you schedule a follow-up appointment in: 3 months.

## 2018-08-16 NOTE — Telephone Encounter (Signed)
-----   Message from Larey Dresser, MD sent at 08/16/2018  3:20 PM EDT ----- K is higher today but still not above normal.  Starting her on eplerenone, would have her follow low K diet and make sure she is not taking a K supplement.

## 2018-08-16 NOTE — Telephone Encounter (Signed)
Relayed message to pt, went over high K+ foods, will have labs drawn next Thursday.

## 2018-08-17 ENCOUNTER — Other Ambulatory Visit: Payer: Self-pay | Admitting: Internal Medicine

## 2018-08-17 MED ORDER — METFORMIN HCL ER 500 MG PO TB24: 1500.0000 mg | ORAL_TABLET | Freq: Every day | ORAL | 3 refills | Status: DC

## 2018-08-18 ENCOUNTER — Telehealth: Payer: Self-pay

## 2018-08-18 NOTE — Telephone Encounter (Signed)
Pt has viewed results via MyChart  

## 2018-08-18 NOTE — Telephone Encounter (Signed)
-----   Message from Biagio Borg, MD sent at 08/17/2018  9:13 PM EDT ----- Left message on MyChart, pt to cont same tx except  The test results show that your current treatment is OK, except the A1c is mildly too high.  Please increase the metformin to 3 pills in the AM.  I will send the new prescription, and you should hear from the office as well.Redmond Baseman to please inform pt, I will do rx

## 2018-08-25 ENCOUNTER — Other Ambulatory Visit (HOSPITAL_COMMUNITY): Payer: Medicare PPO

## 2018-08-29 ENCOUNTER — Other Ambulatory Visit (HOSPITAL_COMMUNITY): Payer: Self-pay

## 2018-08-29 MED ORDER — SACUBITRIL-VALSARTAN 97-103 MG PO TABS: 1.0000 | ORAL_TABLET | Freq: Two times a day (BID) | ORAL | 11 refills | Status: DC

## 2018-10-17 ENCOUNTER — Ambulatory Visit (INDEPENDENT_AMBULATORY_CARE_PROVIDER_SITE_OTHER): Payer: Medicare PPO | Admitting: *Deleted

## 2018-10-17 DIAGNOSIS — I255 Ischemic cardiomyopathy: Secondary | ICD-10-CM | POA: Diagnosis not present

## 2018-10-17 DIAGNOSIS — I5022 Chronic systolic (congestive) heart failure: Secondary | ICD-10-CM

## 2018-10-18 LAB — CUP PACEART REMOTE DEVICE CHECK
Battery Remaining Longevity: 20 mo
Battery Remaining Percentage: 20 %
Battery Voltage: 2.77 V
Brady Statistic AP VP Percent: 1.5 %
Brady Statistic AP VS Percent: 93 %
Brady Statistic AS VP Percent: 1 %
Brady Statistic AS VS Percent: 5.1 %
Brady Statistic RA Percent Paced: 92 %
Brady Statistic RV Percent Paced: 2.1 %
Date Time Interrogation Session: 20200727111014
HighPow Impedance: 50 Ohm
Implantable Lead Implant Date: 20070117
Implantable Lead Implant Date: 20070117
Implantable Lead Location: 753859
Implantable Lead Location: 753860
Implantable Lead Model: 7040
Implantable Pulse Generator Implant Date: 20130212
Lead Channel Impedance Value: 480 Ohm
Lead Channel Impedance Value: 610 Ohm
Lead Channel Pacing Threshold Amplitude: 0.5 V
Lead Channel Pacing Threshold Amplitude: 1 V
Lead Channel Pacing Threshold Pulse Width: 0.5 ms
Lead Channel Pacing Threshold Pulse Width: 0.5 ms
Lead Channel Sensing Intrinsic Amplitude: 11.8 mV
Lead Channel Sensing Intrinsic Amplitude: 3.5 mV
Lead Channel Setting Pacing Amplitude: 2 V
Lead Channel Setting Pacing Amplitude: 2.5 V
Lead Channel Setting Pacing Pulse Width: 0.5 ms
Lead Channel Setting Sensing Sensitivity: 0.5 mV
Pulse Gen Serial Number: 819806

## 2018-10-19 ENCOUNTER — Other Ambulatory Visit: Payer: Self-pay | Admitting: Internal Medicine

## 2018-10-26 ENCOUNTER — Other Ambulatory Visit: Payer: Self-pay | Admitting: Internal Medicine

## 2018-11-02 NOTE — Progress Notes (Signed)
Remote ICD transmission.   

## 2018-11-09 ENCOUNTER — Other Ambulatory Visit: Payer: Self-pay

## 2018-11-10 ENCOUNTER — Ambulatory Visit: Payer: Medicare PPO | Admitting: Internal Medicine

## 2018-11-11 ENCOUNTER — Ambulatory Visit: Payer: Medicare PPO | Admitting: Internal Medicine

## 2018-11-17 ENCOUNTER — Encounter (HOSPITAL_COMMUNITY): Payer: Medicare PPO | Admitting: Cardiology

## 2018-11-21 ENCOUNTER — Other Ambulatory Visit: Payer: Self-pay | Admitting: Internal Medicine

## 2018-11-21 ENCOUNTER — Other Ambulatory Visit: Payer: Self-pay

## 2018-11-23 ENCOUNTER — Other Ambulatory Visit: Payer: Self-pay

## 2018-11-23 ENCOUNTER — Ambulatory Visit: Payer: Medicare PPO | Admitting: Internal Medicine

## 2018-11-23 ENCOUNTER — Encounter: Payer: Self-pay | Admitting: Internal Medicine

## 2018-11-23 VITALS — BP 128/60 | HR 60 | Ht 63.0 in | Wt 204.0 lb

## 2018-11-23 DIAGNOSIS — IMO0002 Reserved for concepts with insufficient information to code with codable children: Secondary | ICD-10-CM

## 2018-11-23 DIAGNOSIS — Z23 Encounter for immunization: Secondary | ICD-10-CM

## 2018-11-23 DIAGNOSIS — E1165 Type 2 diabetes mellitus with hyperglycemia: Secondary | ICD-10-CM

## 2018-11-23 DIAGNOSIS — E1151 Type 2 diabetes mellitus with diabetic peripheral angiopathy without gangrene: Secondary | ICD-10-CM | POA: Diagnosis not present

## 2018-11-23 LAB — POCT GLYCOSYLATED HEMOGLOBIN (HGB A1C): Hemoglobin A1C: 7.7 % — AB (ref 4.0–5.6)

## 2018-11-23 NOTE — Patient Instructions (Addendum)
Please move: - Metformin XR 1500 mg with dinner  Please move: - Levemir 35 units to am - Novolog:  - 10 units before a small meal - 14 units before a large meal  Try not to snack between meals.  Please return in 3-4 months with your sugar log.

## 2018-11-23 NOTE — Progress Notes (Signed)
Patient ID: [name], female   DOB: [birth year], 73 y.o.   MRN: [id number]  HPI: [name] is a 73 y.o.-year-old female, presenting for f/u for DM2, insulin-dependent, uncontrolled, with complications (CAD, ICM - had ICD, peripheral neuropathy, gastroparesis). Last visit 4 months ago (virtual).  Before last visit, her 75 y/o son passed away in 06/06/2018 (had cirrhosis >> developed cough, fever, generalized pain - Covid 19 negative, + bilateral PNA).  Her sugars were higher than.  At this visit, she tells me that her nephew died in July 07, 2018 and her brother died 11-06-18.  She is very stressed and sugars continue to be fluctuating, with more spikes in the 200s.  Last hemoglobin A1c was: Lab Results  Component Value Date   HGBA1C 7.6 (H) 08/16/2018   HGBA1C 6.1 (A) 02/25/2018   HGBA1C 7.2 07/02/2017  07/03/2013: 7.2% - through her insurance  She is now on: - Metformin XR 1000 >> 1500 mg with dinner >> now in am - Levemir 35 units at night - Novolog:  - 10 units before a small meal - 14 units before a large meal  Pt checks her sugars 3-4 times a day - am:  180-200 >> 62-143, 180 >> 61, 102-184 >> 85-150 - after b'fast: 175 >> n/c - before lunch: 180 >> 62-131, 143 >> 108-191 >> 110-187, 240, 256 - after lunch:  45, 47, 140-150 >> n/c - before dinner:  116-264 >> 110-180 >> 97-184, 220, 257 - bedtime:  47, 170s  >> 71, 110-275 >> n/c >> 87-258 Lowest sugar was 45 >> 56 (seldom - evening, night) >> 50s (no particular time of the day) >> 85, but 50s at night once every 2 weeks - when stays in the heat;  she has hypoglycemia awareness in the 80s Highest sugar was HI (steroid inj) >> 275 >> 300 x1 >> 256.  -+ CKD, last BUN/creatinine:   Lab Results  Component Value Date   BUN 34 (H) 08/16/2018   CREATININE 1.69 (H) 08/16/2018   GFR low: Lab Results  Component Value Date   GFRNONAA 28 (L) 08/16/2018   GFRNONAA 38 (L) 05/20/2018   GFRNONAA 32 (L) 11/05/2017   GFRNONAA 32  (L) 08/27/2017   GFRNONAA 25 (L) 08/20/2017   ACR normal: Lab Results  Component Value Date   MICRALBCREAT 5.3 08/16/2018   MICRALBCREAT 9.4 04/08/2017   MICRALBCREAT 3.2 06/25/2016   MICRALBCREAT 1.6 12/21/2014   MICRALBCREAT 1.0 12/15/2012  On Entresto.  -+ HL; last set of lipids: Lab Results  Component Value Date   CHOL 147 08/16/2018   HDL 49.80 08/16/2018   LDLCALC 81 08/16/2018   TRIG 85.0 08/16/2018   CHOLHDL 3 08/16/2018  On Crestor.  - last eye exam was in 2018: No DR  -She has numbness and tingling in her feet-on Neurontin  She is on Eliquis.  Latest TSH normal: Lab Results  Component Value Date   TSH 3.02 08/16/2018   She continues on levothyroxine 25 mcg daily  ROS: Constitutional: no weight gain/no weight loss, no fatigue, no subjective hyperthermia, no subjective hypothermia Eyes: no blurry vision, no xerophthalmia ENT: no sore throat, no nodules palpated in neck, no dysphagia, no odynophagia, no hoarseness Cardiovascular: no CP/no SOB/no palpitations/no leg swelling Respiratory: no cough/no SOB/no wheezing Gastrointestinal: no N/no V/no D/no C/no acid reflux Musculoskeletal: no muscle aches/+ joint aches Skin: no rashes, no hair loss Neurological: no tremors/+ numbness/+ tingling/no dizziness  I reviewed pt's medications, allergies, PMH, social hx, family  hx, and changes were documented in the history of present illness. Otherwise, unchanged from my initial visit note.  Past Medical History:  Diagnosis Date  . Allergy   . Arthritis   . Atrial fibrillation ([place])    controlled with amiodarone, on coumadin  . Chronic renal insufficiency   . Chronic systolic heart failure ([place])   . Congestive heart failure ([place])   . Coronary artery disease 01/31/2011  . Diabetes mellitus    type 1  . 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   . History of chicken pox   . Hyperlipidemia   . Hypertension   . Ischemic cardiomyopathy   . Lumbar  spondylosis 01/11/2012  . Sleep apnea   . Stroke [facility]) 2010   eye doctor said she had TIA  . Ventricular tachycardia ([place])    Polymorphic   Past Surgical History:  Procedure Laterality Date  . ABDOMINAL HYSTERECTOMY  1995  . CARDIAC DEFIBRILLATOR PLACEMENT    . CHOLECYSTECTOMY  1985  . COLONOSCOPY WITH PROPOFOL N/A 02/24/2018   Procedure: COLONOSCOPY WITH PROPOFOL;  Surgeon: Milus Banister, MD;  Location: [facility] ENDOSCOPY;  Service: Endoscopy;  Laterality: N/A;  . ICD  2003/2007   implanted by Dr Rollene Fare, most recent generator change 2/13 by Dr Rayann Heman, Analyze ST study patient  . IMPLANTABLE CARDIOVERTER DEFIBRILLATOR (ICD) GENERATOR CHANGE N/A 05/05/2011   Procedure: ICD GENERATOR CHANGE;  Surgeon: Thompson Grayer, MD;  Location: [facility] CATH LAB;  Service: Cardiovascular;  Laterality: N/A;  . LEFT HEART CATHETERIZATION WITH CORONARY ANGIOGRAM N/A 01/30/2011   Procedure: LEFT HEART CATHETERIZATION WITH CORONARY ANGIOGRAM;  Surgeon: Larey Dresser, MD;  Location: [facility] CATH LAB;  Service: Cardiovascular;  Laterality: N/A;  . POLYPECTOMY  02/24/2018   Procedure: POLYPECTOMY;  Surgeon: Milus Banister, MD;  Location: [facility] ENDOSCOPY;  Service: Endoscopy;;  . [place] History   Socioeconomic History  . Marital status: Divorced    Spouse name: Not on file  . Number of children: 3  . Years of education: 67  . Highest education level: Not on file  Occupational History  . Occupation: Retried  Scientific laboratory technician  . Financial resource strain: Not on file  . Food insecurity    Worry: Not on file    Inability: Not on file  . Transportation needs    Medical: Not on file    Non-medical: Not on file  Tobacco Use  . Smoking status: Former Smoker    Quit date: 08/16/1995    Years since quitting: 23.2  . Smokeless tobacco: Never Used  Substance and Sexual Activity  . Alcohol use: No  . Drug use: No  . Sexual activity: Never    Birth control/protection: Post-menopausal  Lifestyle  .  Physical activity    Days per week: Not on file    Minutes per session: Not on file  . Stress: Not on file  Relationships  . Social Herbalist on phone: Not on file    Gets together: Not on file    Attends religious service: Not on file    Active member of club or organization: Not on file    Attends meetings of clubs or organizations: Not on file    Relationship status: Not on file  . Intimate partner violence    Fear of current or ex partner: Not on file    Emotionally abused: Not on file    Physically abused: Not on file    Forced sexual  activity: Not on file  Other Topics Concern  . Not on file  Social History Narrative   Regular exercise-no   Caffeine Use-no   Current Outpatient Medications on File Prior to Visit  Medication Sig Dispense Refill  . acetaminophen (TYLENOL) 500 MG tablet Take 500 mg by mouth daily as needed for moderate pain or headache.    . ALPRAZolam (XANAX) 0.5 MG tablet 1/2 - 1 tab by mouth twice per day as needed 40 tablet 0  . amiodarone (PACERONE) 100 MG tablet TAKE 1 TABLET BY MOUTH EVERY DAY 90 tablet 0  . Blood Glucose Monitoring Suppl (ACCU-CHEK NANO SMARTVIEW) W/DEVICE KIT Use to test blood sugar 4 times daily as instructed. Dx code: E11.59 1 kit 0  . carvedilol (COREG) 12.5 MG tablet Take 1.5 tablets (18.75 mg total) by mouth 2 (two) times daily with a meal. Please cancel all previous orders for current medication. Change in dosage or pill size. 90 tablet 6  . Cholecalciferol (VITAMIN D3) 2000 UNITS TABS Take 2,000 Units by mouth daily.     [name] dicyclomine (BENTYL) 10 MG capsule TAKE 1 CAPSULE (10 MG TOTAL) BY MOUTH 4 (FOUR) TIMES DAILY BEFORE MEALS AND AT BEDTIME. 120 capsule 1  . ELIQUIS 5 MG TABS tablet TAKE 1 TABLET BY MOUTH TWICE A DAY 60 tablet 11  . eplerenone (INSPRA) 25 MG tablet Take 1 tablet (25 mg total) by mouth daily. 90 tablet 1  . furosemide (LASIX) 20 MG tablet TAKE 1 TABLET BY MOUTH EVERY DAY (Patient taking differently:  every other day. ) 90 tablet 1  . gabapentin (NEURONTIN) 400 MG capsule TAKE 1 CAPSULE (400 MG TOTAL) BY MOUTH 2 (TWO) TIMES DAILY. 180 capsule 1  . glucose blood (ACCU-CHEK SMARTVIEW) test strip Use to test blood sugar 4 times daily as instructed. Dx code E11.59 200 each 11  . insulin aspart (NOVOLOG) 100 UNIT/ML injection Inject 10-14 Units into the skin 3 (three) times daily before meals. 3 vial PRN  . Insulin Detemir (LEVEMIR FLEXTOUCH) 100 UNIT/ML Pen Inject 35 Units into the skin at bedtime. 15 pen 3  . levothyroxine (SYNTHROID) 25 MCG tablet TAKE 1 TABLET (25 MCG TOTAL) BY MOUTH DAILY BEFORE BREAKFAST. 90 tablet 1  . magnesium oxide (MAG-OX) 400 MG tablet TAKE 1 TABLET BY MOUTH TWICE A DAY 60 tablet 0  . metFORMIN (GLUCOPHAGE-XR) 500 MG 24 hr tablet Take 3 tablets (1,500 mg total) by mouth daily with supper. 270 tablet 3  . Multiple Vitamins-Minerals (EYE VITAMINS PO) Take 1 tablet by mouth 2 (two) times daily.     . nitroGLYCERIN (NITROSTAT) 0.4 MG SL tablet Place 1 tablet (0.4 mg total) under the tongue every 5 (five) minutes as needed for chest pain. 25 tablet 1  . nizatidine (AXID) 150 MG capsule Take 1 capsule by mouth daily. Must keep 04/29/16 appt for future refills 30 capsule 0  . omeprazole (PRILOSEC) 20 MG capsule TAKE 1 CAPSULE BY MOUTH EVERY DAY 90 capsule 1  . rosuvastatin (CRESTOR) 20 MG tablet TAKE 1 TABLET BY MOUTH EVERY DAY 90 tablet 3  . sacubitril-valsartan (ENTRESTO) 97-103 MG Take 1 tablet by mouth 2 (two) times daily. 60 tablet 11  . traMADol (ULTRAM) 50 MG tablet Take 1 tablet (50 mg total) by mouth every 6 (six) hours as needed (pain). 120 tablet 2  . triamcinolone (NASACORT) 55 MCG/ACT AERO nasal inhaler Place 2 sprays into the nose daily. 1 Inhaler 12   No current facility-administered medications on file prior  to visit.    Allergies  Allergen Reactions  . Veltassa [Patiromer] Diarrhea  . Darvon Other (See Comments)    indigestion  . Promethazine Hcl Other (See  Comments)    hyperactivity  . Spironolactone Diarrhea and Nausea And Vomiting  . Plavix [Clopidogrel Bisulfate] Rash   Family History  Problem Relation Age of Onset  . Diabetes Mother   . Heart disease Mother   . Hyperlipidemia Mother   . Hypertension Mother   . Heart disease Father   . Heart attack Father   . Hypertension Father   . Early death Brother 51  . Breast cancer Sister   . Lung cancer Sister   . Irritable bowel syndrome Sister   . Colon cancer Maternal Aunt   . Esophageal cancer Neg Hx   . Colon polyps Neg Hx     PE: BP 128/60   Pulse 60   Ht _0  (1.6 m)   Wt 204 lb (92.5 kg)   SpO2 97%   BMI 36.14 kg/m  Body mass index is 36.14 kg/m.  Wt Readings from Last 3 Encounters:  11/23/18 204 lb (92.5 kg)  08/16/18 204 lb 9.6 oz (92.8 kg)  05/30/18 199 lb 9.6 oz (90.5 kg)   Constitutional: overweight, in NAD Eyes: PERRLA, EOMI, no exophthalmos ENT: moist mucous membranes, no thyromegaly, no cervical lymphadenopathy Cardiovascular: RRR, No MRG Respiratory: CTA B Gastrointestinal: abdomen soft, NT, ND, BS+ Musculoskeletal: no deformities, strength intact in all 4 Skin: moist, warm, no rashes Neurological: no tremor with outstretched hands, DTR normal in all 4  ASSESSMENT: 1. DM2, insulin-dependent, controlled, with complications - CAD, ICM - s/p ICD placement - CKD - PN - on neurontin - gastroparesis per [facility] 06/18/2012 >> 60 minutes: 92%, 120 minutes: 82%  2. 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 HL  PLAN:  1. Patient with longstanding, previously uncontrolled type 2 diabetes, with basal/bolus insulin regimen and also metformin, and also after improving her diet.  At last visit, sugars were more variable but she had major stress in her life with her son passing away at 57 with cirrhosis.  At that time, she was also occasionally having low blood sugars, 50s and 60s due to delaying meals.  We discussed that we we would need to reduce the Levemir dose if this continued, but I  did not change her insulin doses at last visit. -Latest HbA1c was reviewed from 4 months ago and this was higher, and 7.6%.  At that time, her PCP advised her to increase the metformin dose to 1500 mg daily but she was also advised to take this in the morning, rather than with dinner. -At this visit, we reviewed together her sugars at home and they are worse.  She has more spikes in the 200s, possibly related to significant stress with many family members dying recently.  She is also snacking between meals and we discussed about reducing these.  She is still having occasional low blood sugars in the 50s at night so we discussed about moving Levemir in the morning.  I also advised her to move metformin with dinner.  We will not change the dose of NovoLog. - I suggested to: Patient Instructions  Please move: - Metformin XR 1500 mg with dinner  Please move: - Levemir 35 units to am - Novolog:  - 10 units before a small meal - 14 units before a large meal  Try not to snack between meals.  Please return in 3-4 months with  your sugar log.   - we checked her HbA1c: 7.7% (slightly higher) - advised to check sugars at different times of the day - 3x a day, rotating check times - advised for yearly eye exams >> she is not UTD - return to clinic in 4 months    2. PN -Stable -Continues Neurontin without side effects  3. HL -Reviewed latest lipid panel from earlier this year, all fractions at goal Lab Results  Component Value Date   CHOL 147 08/16/2018   HDL 49.80 08/16/2018   LDLCALC 81 08/16/2018   TRIG 85.0 08/16/2018   CHOLHDL 3 08/16/2018  -Continues Crestor without side effects  Philemon Kingdom, MD PhD [facility]

## 2018-11-23 NOTE — Addendum Note (Signed)
Addended by: Cardell Peach I on: 11/23/2018 11:06 AM   Modules accepted: Orders

## 2018-11-29 ENCOUNTER — Other Ambulatory Visit: Payer: Self-pay | Admitting: Internal Medicine

## 2018-12-22 ENCOUNTER — Other Ambulatory Visit: Payer: Self-pay | Admitting: Internal Medicine

## 2018-12-26 ENCOUNTER — Other Ambulatory Visit: Payer: Self-pay | Admitting: Internal Medicine

## 2018-12-29 DIAGNOSIS — M25512 Pain in left shoulder: Secondary | ICD-10-CM | POA: Diagnosis not present

## 2018-12-29 DIAGNOSIS — M25511 Pain in right shoulder: Secondary | ICD-10-CM | POA: Diagnosis not present

## 2018-12-29 DIAGNOSIS — M19011 Primary osteoarthritis, right shoulder: Secondary | ICD-10-CM | POA: Diagnosis not present

## 2018-12-29 DIAGNOSIS — M19012 Primary osteoarthritis, left shoulder: Secondary | ICD-10-CM | POA: Diagnosis not present

## 2019-01-05 DIAGNOSIS — M25562 Pain in left knee: Secondary | ICD-10-CM | POA: Diagnosis not present

## 2019-01-05 DIAGNOSIS — M25561 Pain in right knee: Secondary | ICD-10-CM | POA: Diagnosis not present

## 2019-01-10 ENCOUNTER — Other Ambulatory Visit: Payer: Self-pay

## 2019-01-10 ENCOUNTER — Ambulatory Visit (HOSPITAL_COMMUNITY)
Admission: RE | Admit: 2019-01-10 | Discharge: 2019-01-10 | Disposition: A | Discharge status: Home / Self Care | Payer: Medicare PPO | Source: Ambulatory Visit | Attending: Cardiology | Admitting: Cardiology

## 2019-01-10 ENCOUNTER — Encounter (HOSPITAL_COMMUNITY): Payer: Self-pay | Admitting: Cardiology

## 2019-01-10 VITALS — BP 140/80 | HR 66 | Wt 205.6 lb

## 2019-01-10 DIAGNOSIS — Z90710 Acquired absence of both cervix and uterus: Secondary | ICD-10-CM | POA: Diagnosis not present

## 2019-01-10 DIAGNOSIS — Z95810 Presence of automatic (implantable) cardiac defibrillator: Secondary | ICD-10-CM | POA: Diagnosis not present

## 2019-01-10 DIAGNOSIS — I2510 Atherosclerotic heart disease of native coronary artery without angina pectoris: Secondary | ICD-10-CM | POA: Diagnosis not present

## 2019-01-10 DIAGNOSIS — Z79890 Hormone replacement therapy (postmenopausal): Secondary | ICD-10-CM | POA: Insufficient documentation

## 2019-01-10 DIAGNOSIS — Z955 Presence of coronary angioplasty implant and graft: Secondary | ICD-10-CM | POA: Insufficient documentation

## 2019-01-10 DIAGNOSIS — Z7901 Long term (current) use of anticoagulants: Secondary | ICD-10-CM | POA: Insufficient documentation

## 2019-01-10 DIAGNOSIS — E1122 Type 2 diabetes mellitus with diabetic chronic kidney disease: Secondary | ICD-10-CM | POA: Insufficient documentation

## 2019-01-10 DIAGNOSIS — I255 Ischemic cardiomyopathy: Secondary | ICD-10-CM | POA: Diagnosis not present

## 2019-01-10 DIAGNOSIS — I5022 Chronic systolic (congestive) heart failure: Secondary | ICD-10-CM | POA: Diagnosis not present

## 2019-01-10 DIAGNOSIS — Z79899 Other long term (current) drug therapy: Secondary | ICD-10-CM | POA: Insufficient documentation

## 2019-01-10 DIAGNOSIS — Z794 Long term (current) use of insulin: Secondary | ICD-10-CM | POA: Diagnosis not present

## 2019-01-10 DIAGNOSIS — Z87891 Personal history of nicotine dependence: Secondary | ICD-10-CM | POA: Diagnosis not present

## 2019-01-10 DIAGNOSIS — I472 Ventricular tachycardia: Secondary | ICD-10-CM | POA: Insufficient documentation

## 2019-01-10 DIAGNOSIS — I130 Hypertensive heart and chronic kidney disease with heart failure and stage 1 through stage 4 chronic kidney disease, or unspecified chronic kidney disease: Secondary | ICD-10-CM | POA: Insufficient documentation

## 2019-01-10 DIAGNOSIS — I480 Paroxysmal atrial fibrillation: Secondary | ICD-10-CM | POA: Insufficient documentation

## 2019-01-10 DIAGNOSIS — N183 Chronic kidney disease, stage 3 unspecified: Secondary | ICD-10-CM | POA: Diagnosis not present

## 2019-01-10 DIAGNOSIS — Z8673 Personal history of transient ischemic attack (TIA), and cerebral infarction without residual deficits: Secondary | ICD-10-CM | POA: Diagnosis not present

## 2019-01-10 DIAGNOSIS — Z8249 Family history of ischemic heart disease and other diseases of the circulatory system: Secondary | ICD-10-CM | POA: Diagnosis not present

## 2019-01-10 DIAGNOSIS — E039 Hypothyroidism, unspecified: Secondary | ICD-10-CM | POA: Insufficient documentation

## 2019-01-10 DIAGNOSIS — E1143 Type 2 diabetes mellitus with diabetic autonomic (poly)neuropathy: Secondary | ICD-10-CM | POA: Diagnosis not present

## 2019-01-10 LAB — COMPREHENSIVE METABOLIC PANEL
ALT: 14 U/L (ref 0–44)
AST: 13 U/L — ABNORMAL LOW (ref 15–41)
Albumin: 3.6 g/dL (ref 3.5–5.0)
Alkaline Phosphatase: 57 U/L (ref 38–126)
Anion gap: 8 (ref 5–15)
BUN: 49 mg/dL — ABNORMAL HIGH (ref 8–23)
CO2: 24 mmol/L (ref 22–32)
Calcium: 8.6 mg/dL — ABNORMAL LOW (ref 8.9–10.3)
Chloride: 111 mmol/L (ref 98–111)
Creatinine, Ser: 1.62 mg/dL — ABNORMAL HIGH (ref 0.44–1.00)
GFR calc Af Amer: 37 mL/min — ABNORMAL LOW (ref >60)
GFR calc non Af Amer: 32 mL/min — ABNORMAL LOW (ref >60)
Glucose, Bld: 188 mg/dL — ABNORMAL HIGH (ref 70–99)
Potassium: 4.2 mmol/L (ref 3.5–5.1)
Sodium: 143 mmol/L (ref 135–145)
Total Bilirubin: 0.6 mg/dL (ref 0.3–1.2)
Total Protein: 6.7 g/dL (ref 6.5–8.1)

## 2019-01-10 LAB — CBC
HCT: 41.7 % (ref 36.0–46.0)
Hemoglobin: 13.6 g/dL (ref 12.0–15.0)
MCH: 31.1 pg (ref 26.0–34.0)
MCHC: 32.6 g/dL (ref 30.0–36.0)
MCV: 95.4 fL (ref 80.0–100.0)
Platelets: 221 10*3/uL (ref 150–400)
RBC: 4.37 MIL/uL (ref 3.87–5.11)
RDW: 13.5 % (ref 11.5–15.5)
WBC: 9 10*3/uL (ref 4.0–10.5)
nRBC: 0 % (ref 0.0–0.2)

## 2019-01-10 MED ORDER — CARVEDILOL 25 MG PO TABS: 25.0000 mg | ORAL_TABLET | Freq: Two times a day (BID) | ORAL | 6 refills | Status: DC

## 2019-01-10 NOTE — Patient Instructions (Signed)
INCREASE Coreg to 25mg  (1 tab) twice a day  Labs today We will only contact you if something comes back abnormal or we need to make some changes. Otherwise no news is good news!  Your physician recommends that you schedule a follow-up appointment in: 4 months with Dr Aundra Dubin.   At the [facility], you and your health needs are our priority. As part of our continuing mission to provide you with exceptional heart care, we have created designated Provider Care Teams. These Care Teams include your primary Cardiologist (physician) and Advanced Practice Providers (APPs- Physician Assistants and Nurse Practitioners) who all work together to provide you with the care you need, when you need it.   You may see any of the following providers on your designated Care Team at your next follow up: [name] Dr Glori Bickers . Dr Loralie Champagne . Darrick Grinder, NP . Lyda Jester, PA   Please be sure to bring in all your medications bottles to every appointment.

## 2019-01-10 NOTE — Progress Notes (Signed)
Patient ID: [name], female   DOB: [birth year], 73 y.o.   MRN: [id number] PCP: Dr Jenny Reichmann Cardiology: Dr. Aundra Dubin  73 y.o. with history of CAD, ischemic cardiomyopathy, and atrial fibrillation presents for cardiology followup.  Patient was hospitalized in 11/12 at [facility] for VT with ICD discharge.  She had a left heart cath showing patent stents.  EF was 35-40% by echo.  She is on amiodarone.  She has had periodic problems with creatinine rising with medication adjustments.  Echo in 12/18 showed EF 35-40%, inferior and septal hypokinesis, mild MR.    Echo in 5/20 showed EF 40%, normal RV, mild AI.   She returns today for followup of CHF.  At last appointment, eplerenone was started.  She did not tolerate this due nausea and "feeling bad" and is no longer taking it.  No significant exertional dyspnea.  Knee pain improved by steroid injections.  No palpitations.  No BRBPR/melena.  Recent stressors, multiple family members have passed away.  Currently living with her daughter in [place]. Weight stable.   Labs (10/12): LDL 73, HDL 44 Labs (11/12): K 3.9, creatinine 1.1, LFTs normal, TSH normal Labs (12/12): K 3.9, creatinine 1.5, proBNP 18 Labs (1/13): LDL 82, HDL 57 Labs (2/13): K 4.3, creatinine 1.5 Labs (5/13): creatinine 2.4 => 1.7, LFTs normal, TSH normal, proBNP 18 Labs (6/13): K 4.2, creatinine 1.7 Labs (10/13): K 4.3, creatinine 1.4 Labs (4/14); LFTs normal, TSH normal, LDL 71, HDL 58 Labs (5/14): K 4.5, creatinine 1.4 Labs (8/14): TSH normal, LFTs normal Labs (9/14): K 4.2, creatinine 1.8, LDL 75, HDL 54 Labs (07/31/13) K 3.7 Creatinine 1.8 BNP 148  Labs (9/15): K 4.3, creatinine 1.24, LFTs normal, LDL 81, HDL 58, TSH normal Labs (9/16): K 4.4, creatinine 1.44, LDL 64, HDL 54, LFTs normal, TSH normal, HCT 38 Labs (2/17): K 4.9, creatinine 1.76, HCT 38.6 Labs (1/19): LDL 71, HDL 58, K 4.8, creatinine 1.26, LFTs normal, hgb 12.9.  Labs (2/19): Creatinine 1.35, K 4.9 Labs (6/19):  K 4.4, creatinine 1.57 Labs (8/19): K 5, creatinine 1.59, LFTs normal Labs (2/20): K 4.4, creatinine 1.4, LDL 72, HDL 55, normal LFTs Labs (5/20): LDL 81, K 4.8, creatinine 1.69  PMH: 1. Diabetes mellitus 2. CVA, TIA in 2010 3. HTN 4. CKD stage 3 5. H/o TAH 6. H/o CCY 7. Sciatica 8. Atrial fibrillation 9. CAD: s/p LAD and RCA PCI.  Last LHC in 11/12 with patent proximal LAD stent, ostial 70% D1 (jailed by stent), mild LAD stent patent, patent RCA stents, EF 40% with global hypokinesis.  10. Ischemic cardiomyopathy: Echo (10/12): EF 35-40%, moderate focal basal septal hypertrophy, inferior akinesis, grade I diastolic dysfunction, moderate aortic insufficiency. [employer] dual chamber ICD.  Spironolactone stopped when creatinine rose to 2.5.  Echo (5/14) with EF 40-45%, mild AI.  Echo (6/16) with EF 40-45%, inferior/inferoseptal hypokinesis, mild LVH.  - Echo (12/18): EF 35-40%, inferior and septal hypokinesis, mild MR, mild AI.  - Echo (5/20): EF 40%, inferior and inferoseptal hypokinesis, normal RV size and systolic function, mild AI, normal IVC.  - Unable to tolerate eplerenone.  11. Aortic insufficiency: moderate in past but mild on most recent echo.   12. 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  13. Gastroparesis 14. Hypothyroidism  SH: Divorced.  3 children.  Quit smoking in 1997. Lives in [place] now with daughter.  [place].   FH: CAD  ROS: All systems reviewed and negative except as per HPI.   Current Outpatient Medications  Medication Sig Dispense Refill  . acetaminophen (TYLENOL) 500 MG tablet Take 500 mg by mouth daily as needed for moderate pain or headache.    [name] amiodarone (PACERONE) 100 MG tablet TAKE 1 TABLET BY MOUTH EVERY DAY 90 tablet 0  . Blood Glucose Monitoring Suppl (ACCU-CHEK NANO SMARTVIEW) W/DEVICE KIT Use to test blood sugar 4 times daily as instructed. Dx code: E11.59 1 kit 0  . carvedilol (COREG) 25 MG tablet Take 1 tablet (25 mg total) by mouth 2 (two) times  daily with a meal. Please cancel all previous orders for current medication. Change in dosage or pill size. 60 tablet 6  . Cholecalciferol (VITAMIN D3) 2000 UNITS TABS Take 2,000 Units by mouth daily.     [name] dicyclomine (BENTYL) 10 MG capsule TAKE 1 CAPSULE (10 MG TOTAL) BY MOUTH 4 (FOUR) TIMES DAILY BEFORE MEALS AND AT BEDTIME. 120 capsule 1  . ELIQUIS 5 MG TABS tablet TAKE 1 TABLET BY MOUTH TWICE A DAY 60 tablet 11  . furosemide (LASIX) 20 MG tablet TAKE 1 TABLET BY MOUTH EVERY DAY (Patient taking differently: every other day. ) 90 tablet 1  . gabapentin (NEURONTIN) 400 MG capsule TAKE 1 CAPSULE (400 MG TOTAL) BY MOUTH 2 (TWO) TIMES DAILY. 180 capsule 1  . glucose blood (ACCU-CHEK SMARTVIEW) test strip Use to test blood sugar 4 times daily as instructed. Dx code E11.59 200 each 11  . insulin aspart (NOVOLOG) 100 UNIT/ML injection Inject 10-14 Units into the skin 3 (three) times daily before meals. 3 vial PRN  . LEVEMIR FLEXTOUCH 100 UNIT/ML Pen INJECT 40 UNITS INTO THE SKIN DAILY AT 10 PM. (Patient taking differently: Inject 35 Units into the skin at bedtime. ) 15 mL 1  . levothyroxine (SYNTHROID) 25 MCG tablet TAKE 1 TABLET (25 MCG TOTAL) BY MOUTH DAILY BEFORE BREAKFAST. 90 tablet 1  . magnesium oxide (MAG-OX) 400 MG tablet TAKE 1 TABLET BY MOUTH TWICE A DAY 60 tablet 0  . metFORMIN (GLUCOPHAGE-XR) 500 MG 24 hr tablet Take 3 tablets (1,500 mg total) by mouth daily with supper. 270 tablet 3  . Multiple Vitamins-Minerals (EYE VITAMINS PO) Take 1 tablet by mouth 2 (two) times daily.     . nitroGLYCERIN (NITROSTAT) 0.4 MG SL tablet Place 1 tablet (0.4 mg total) under the tongue every 5 (five) minutes as needed for chest pain. 25 tablet 1  . omeprazole (PRILOSEC) 20 MG capsule TAKE 1 CAPSULE BY MOUTH EVERY DAY 90 capsule 1  . rosuvastatin (CRESTOR) 20 MG tablet TAKE 1 TABLET BY MOUTH EVERY DAY 90 tablet 3  . sacubitril-valsartan (ENTRESTO) 97-103 MG Take 1 tablet by mouth 2 (two) times daily. 60 tablet  11  . traMADol (ULTRAM) 50 MG tablet Take 1 tablet (50 mg total) by mouth every 6 (six) hours as needed (pain). 120 tablet 2   No current facility-administered medications for this encounter.    Filed Weights   01/10/19 0849  Weight: 93.3 kg (205 lb 9.6 oz)    BP 140/80   Pulse 66   Wt 93.3 kg (205 lb 9.6 oz)   SpO2 98%   BMI 36.42 kg/m  General: NAD Neck: No JVD, no thyromegaly or thyroid nodule.  Lungs: Clear to auscultation bilaterally with normal respiratory effort. CV: Nondisplaced PMI.  Heart regular S1/S2, no S3/S4, no murmur.  Trace ankle edema.  No carotid bruit.  Normal pedal pulses.  Abdomen: Soft, nontender, no hepatosplenomegaly, no distention.  Skin: Intact without lesions or rashes.  Neurologic:  Alert and oriented x 3.  Psych: Normal affect. Extremities: No clubbing or cyanosis.  HEENT: Normal.   Assessment/Plan 1. Atrial fibrillation: Paroxysmal.  No recent atrial fibrillation noted.  - Continue amiodarone 100 mg daily.  Check LFTs today. She is on Levoxyl followed by PCP. She will need regular eye exams.  - Continue Eliquis 5 mg bid. CBC today.   2. Coronary artery disease: No chest pain.  Nonobstructive disease on last cath.  - Continue Crestor 20 mg daily.  Lipids mildly above goal in 5/20 at 81 but will not change statin dose for now.  3. Ventricular tachycardia - On amiodarone 100 mg daily with suppression of VT. 4. Chronic systolic CHF: Ischemic cardiomyopathy. NYHA class II symptoms, she is not volume overloaded on exam.  EF 40% on 5/20 echo. She has a [employer] ICD.  - Continue Entresto 97/103 mg BID  - Increase Coreg to 25 mg bid.  - Continue Lasix 20 mg qod.  BMET today.  - She has tolerated neither spironolactone nor eplerenone.  5. CKD stage 3: BMET today.   6. Hypothyroidism: Managed by PCP.   Followup in 4 months   Loralie Champagne 01/10/2019

## 2019-01-11 ENCOUNTER — Other Ambulatory Visit: Payer: Self-pay | Admitting: Internal Medicine

## 2019-01-11 NOTE — Telephone Encounter (Signed)
Very sorry, this is not a medication I normally refill  Please ask pt to seek refill with her cardiologist

## 2019-01-13 NOTE — Telephone Encounter (Signed)
[place] for [employer] ox  Pt needs to see or call cardiology for amiodarone refill

## 2019-01-17 ENCOUNTER — Ambulatory Visit (INDEPENDENT_AMBULATORY_CARE_PROVIDER_SITE_OTHER): Payer: Medicare PPO | Admitting: *Deleted

## 2019-01-17 DIAGNOSIS — I472 Ventricular tachycardia: Secondary | ICD-10-CM

## 2019-01-17 DIAGNOSIS — I4729 Other ventricular tachycardia: Secondary | ICD-10-CM

## 2019-01-17 DIAGNOSIS — I5022 Chronic systolic (congestive) heart failure: Secondary | ICD-10-CM

## 2019-01-17 LAB — CUP PACEART REMOTE DEVICE CHECK
Battery Remaining Longevity: 17 mo
Battery Remaining Percentage: 17 %
Battery Voltage: 2.75 V
Brady Statistic AP VP Percent: 1.7 %
Brady Statistic AP VS Percent: 92 %
Brady Statistic AS VP Percent: 1 %
Brady Statistic AS VS Percent: 5.5 %
Brady Statistic RA Percent Paced: 90 %
Brady Statistic RV Percent Paced: 2.7 %
Date Time Interrogation Session: 20201026074338
HighPow Impedance: 44 Ohm
Implantable Lead Implant Date: 20070117
Implantable Lead Implant Date: 20070117
Implantable Lead Location: 753859
Implantable Lead Location: 753860
Implantable Lead Model: 7040
Implantable Pulse Generator Implant Date: 20130212
Lead Channel Impedance Value: 450 Ohm
Lead Channel Impedance Value: 530 Ohm
Lead Channel Pacing Threshold Amplitude: 0.5 V
Lead Channel Pacing Threshold Amplitude: 1 V
Lead Channel Pacing Threshold Pulse Width: 0.5 ms
Lead Channel Pacing Threshold Pulse Width: 0.5 ms
Lead Channel Sensing Intrinsic Amplitude: 11.8 mV
Lead Channel Sensing Intrinsic Amplitude: 5 mV
Lead Channel Setting Pacing Amplitude: 2 V
Lead Channel Setting Pacing Amplitude: 2.5 V
Lead Channel Setting Pacing Pulse Width: 0.5 ms
Lead Channel Setting Sensing Sensitivity: 0.5 mV
Pulse Gen Serial Number: 819806

## 2019-02-02 ENCOUNTER — Ambulatory Visit: Payer: Medicare PPO | Admitting: Internal Medicine

## 2019-02-03 ENCOUNTER — Other Ambulatory Visit: Payer: Self-pay | Admitting: Internal Medicine

## 2019-02-07 ENCOUNTER — Other Ambulatory Visit (HOSPITAL_COMMUNITY): Payer: Self-pay | Admitting: Cardiology

## 2019-02-07 NOTE — Progress Notes (Signed)
Remote ICD transmission.   

## 2019-02-23 ENCOUNTER — Other Ambulatory Visit: Payer: Self-pay | Admitting: Internal Medicine

## 2019-03-03 ENCOUNTER — Other Ambulatory Visit: Payer: Self-pay | Admitting: Internal Medicine

## 2019-03-03 NOTE — Telephone Encounter (Signed)
Done erx 

## 2019-03-11 ENCOUNTER — Telehealth: Payer: Medicare PPO | Admitting: Physician Assistant

## 2019-03-11 DIAGNOSIS — J309 Allergic rhinitis, unspecified: Secondary | ICD-10-CM

## 2019-03-11 NOTE — Progress Notes (Signed)
E visit for Allergic Rhinitis We are sorry that you are not feeling well.  Here is how we plan to help!  Based on what you have shared with me it looks like you have Allergic Rhinitis.  Rhinitis is when a reaction occurs that causes nasal congestion, runny nose, sneezing, and itching.  Most types of rhinitis are caused by an inflammation and are associated with symptoms in the eyes ears or throat. There are several types of rhinitis.  The most common are acute rhinitis, which is usually caused by a viral illness, allergic or seasonal rhinitis, and nonallergic or year-round rhinitis.  Nasal allergies occur certain times of the year.  Allergic rhinitis is caused when allergens in the air trigger the release of histamine in the body.  Histamine causes itching, swelling, and fluid to build up in the fragile linings of the nasal passages, sinuses and eyelids.  An itchy nose and clear discharge are common.  I recommend the following over the counter treatments: You should take a daily dose of antihistamine and Clarinex 5 mg take 1 tablet daily (NOTE: Do not take if pregnant or breastfeeding)  I also would recommend a nasal spray: Saline 1 spray into each nostril as needed  You may also benefit from eye drops such as: Visine 1-2 drops each eye twice daily as needed   Giving the shortness of breath mentioned, you need assessment with your primary care provider. If anything severe you need to be seen at local Urgent Care or ER.   HOME CARE:   You can use an over-the-counter saline nasal spray as needed  Avoid areas where there is heavy dust, mites, or molds  Stay indoors on windy days during the pollen season  Keep windows closed in home, at least in bedroom; use air conditioner.  Use high-efficiency house air filter  Keep windows closed in car, turn AC on re-circulate  Avoid playing out with dog during pollen season  GET HELP RIGHT AWAY IF:   If your symptoms do not improve within 10  days  You become short of breath  You develop yellow or green discharge from your nose for over 3 days  You have coughing fits  MAKE SURE YOU:   Understand these instructions  Will watch your condition  Will get help right away if you are not doing well or get worse  Thank you for choosing an e-visit. Your e-visit answers were reviewed by a board certified advanced clinical practitioner to complete your personal care plan. Depending upon the condition, your plan could have included both over the counter or prescription medications. Please review your pharmacy choice. Be sure that the pharmacy you have chosen is open so that you can pick up your prescription now.  If there is a problem you may message your provider in [place] to have the prescription routed to another pharmacy. Your safety is important to [name]. If you have drug allergies check your prescription carefully.  For the next 24 hours, you can use MyChart to ask questions about today's visit, request a non-urgent call back, or ask for a work or school excuse from your e-visit provider. You will get an email in the next two days asking about your experience. I hope that your e-visit has been valuable and will speed your recovery.

## 2019-03-11 NOTE — Progress Notes (Signed)
I have spent 5 minutes in review of e-visit questionnaire, review and updating patient chart, medical decision making and response to patient.   Alanys Godino Cody Maegan Buller, PA-C    

## 2019-03-13 ENCOUNTER — Other Ambulatory Visit: Payer: Self-pay | Admitting: Internal Medicine

## 2019-03-13 DIAGNOSIS — E782 Mixed hyperlipidemia: Secondary | ICD-10-CM | POA: Diagnosis not present

## 2019-03-13 DIAGNOSIS — Z794 Long term (current) use of insulin: Secondary | ICD-10-CM | POA: Diagnosis not present

## 2019-03-13 DIAGNOSIS — E162 Hypoglycemia, unspecified: Secondary | ICD-10-CM | POA: Diagnosis not present

## 2019-03-13 DIAGNOSIS — K219 Gastro-esophageal reflux disease without esophagitis: Secondary | ICD-10-CM | POA: Diagnosis not present

## 2019-03-13 DIAGNOSIS — Z8673 Personal history of transient ischemic attack (TIA), and cerebral infarction without residual deficits: Secondary | ICD-10-CM

## 2019-03-13 DIAGNOSIS — E1169 Type 2 diabetes mellitus with other specified complication: Secondary | ICD-10-CM | POA: Diagnosis not present

## 2019-03-13 DIAGNOSIS — E038 Other specified hypothyroidism: Secondary | ICD-10-CM | POA: Diagnosis not present

## 2019-03-23 DIAGNOSIS — E038 Other specified hypothyroidism: Secondary | ICD-10-CM | POA: Diagnosis not present

## 2019-03-23 DIAGNOSIS — Z794 Long term (current) use of insulin: Secondary | ICD-10-CM | POA: Diagnosis not present

## 2019-03-23 DIAGNOSIS — E1169 Type 2 diabetes mellitus with other specified complication: Secondary | ICD-10-CM | POA: Diagnosis not present

## 2019-03-23 DIAGNOSIS — N1831 Chronic kidney disease, stage 3a: Secondary | ICD-10-CM | POA: Diagnosis not present

## 2019-03-23 DIAGNOSIS — E782 Mixed hyperlipidemia: Secondary | ICD-10-CM | POA: Diagnosis not present

## 2019-03-29 ENCOUNTER — Other Ambulatory Visit: Payer: Self-pay

## 2019-03-29 ENCOUNTER — Encounter: Payer: Self-pay | Admitting: Internal Medicine

## 2019-03-29 ENCOUNTER — Ambulatory Visit (INDEPENDENT_AMBULATORY_CARE_PROVIDER_SITE_OTHER): Payer: Medicare PPO | Admitting: Internal Medicine

## 2019-03-29 ENCOUNTER — Ambulatory Visit: Payer: Medicare PPO | Admitting: Internal Medicine

## 2019-03-29 DIAGNOSIS — E1142 Type 2 diabetes mellitus with diabetic polyneuropathy: Secondary | ICD-10-CM

## 2019-03-29 DIAGNOSIS — E785 Hyperlipidemia, unspecified: Secondary | ICD-10-CM

## 2019-03-29 DIAGNOSIS — E1165 Type 2 diabetes mellitus with hyperglycemia: Secondary | ICD-10-CM | POA: Diagnosis not present

## 2019-03-29 DIAGNOSIS — E1151 Type 2 diabetes mellitus with diabetic peripheral angiopathy without gangrene: Secondary | ICD-10-CM | POA: Diagnosis not present

## 2019-03-29 DIAGNOSIS — IMO0002 Reserved for concepts with insufficient information to code with codable children: Secondary | ICD-10-CM

## 2019-03-29 NOTE — Patient Instructions (Addendum)
Please continue: - Metformin XR 1500 mg with dinner - Levemir 35 units at bedtime  Please increase: - Novolog: 8-12 units before meal  Please return in 3-4 months with your sugar log.

## 2019-03-29 NOTE — Progress Notes (Signed)
Patient ID: [name], female   DOB: Jan 07, 1946, 74 y.o.   MRN: [id number]  Patient location: Home My location: Office Persons participating in the virtual visit: patient, provider  Referring Provider: Biagio Borg, MD  I connected with the patient on 03/29/19 at 9:58 AM EST by telephone and verified that I am speaking with the correct person.   I discussed the limitations of evaluation and management by telephone and the availability of in person appointments. The patient expressed understanding and agreed to proceed.   Details of the encounter are shown below.  HPI: [name] is a 74 y.o.-year-old female, presenting for f/u for DM2, insulin-dependent, uncontrolled, with complications (CAD, ICM - had ICD, peripheral neuropathy, gastroparesis). Last visit 4 months ago (virtual).  Reviewed HbA1c levels: 03/13/2019: HbA1c 7.1% Lab Results  Component Value Date   HGBA1C 7.7 (A) 11/23/2018   HGBA1C 7.6 (H) 08/16/2018   HGBA1C 6.1 (A) 02/25/2018  07/03/2013: 7.2% - through her insurance  She is now on: - Metformin XR 1000 >> 1500 mg with dinner >> in am >> moved back to dinnertime - Levemir 35 units at night >> moved to a.m. >> 35 units at night - Novolog: B: 10 units, L: 5-6 units, D: 5-6 units   Pt checks her sugars 3-4 times a day: - am:  62-143, 180 >> 61, 102-184 >> 85-150 >> 110-150 - after b'fast: 175 >> n/c - before lunch:108-191 >> 110-187, 240, 256 >> 94-150, 251 - after lunch:  45, 47, 140-150 >> n/c - before dinner:   110-180 >> 97-184, 220, 257 >> 120-180, 352 - bedtime:   71, 110-275 >> n/c >> 87-258 >> 115-210 Lowest sugar was 45 >> .Marland KitchenMarland Kitchen85, but 50s at night >> 94;  she has hypoglycemia awareness in the 90s. Highest sugar was 300 x1 >> 256 >> 352 (correction of a 94).  -+ CKD, last BUN/creatinine:   Lab Results  Component Value Date   BUN 49 (H) 01/10/2019   CREATININE 1.62 (H) 01/10/2019   GFR low: Lab Results  Component Value Date   GFRNONAA  32 (L) 01/10/2019   GFRNONAA 28 (L) 08/16/2018   GFRNONAA 38 (L) 05/20/2018   GFRNONAA 32 (L) 11/05/2017   GFRNONAA 32 (L) 08/27/2017   ACR normal: Lab Results  Component Value Date   MICRALBCREAT 5.3 08/16/2018   MICRALBCREAT 9.4 04/08/2017   MICRALBCREAT 3.2 06/25/2016   MICRALBCREAT 1.6 12/21/2014   MICRALBCREAT 1.0 12/15/2012  On Entresto.  -+ HL; last set of lipids: Lab Results  Component Value Date   CHOL 147 08/16/2018   HDL 49.80 08/16/2018   LDLCALC 81 08/16/2018   TRIG 85.0 08/16/2018   CHOLHDL 3 08/16/2018  On Crestor.  - last eye exam was in 06-11-18: No DR  -+ numbness and tingling in her feet-on Neurontin  She is on Eliquis.  She has a history of hypothyroidism.  Latest TSH was normal: Lab Results  Component Value Date   TSH 3.02 08/16/2018   She continues on levothyroxine 25 mcg daily.  Her 35 y/o son passed away in Jun 11, 2018 (had cirrhosis >> developed cough, fever, generalized pain+ bilateral PNA (Covid 19 negative).    Her nephew died in 12-Jul-2018 and her brother died 11-11-2018.    ROS: Constitutional: + weight gain/+ weight loss, no fatigue, no subjective hyperthermia, no subjective hypothermia Eyes: no blurry vision, no xerophthalmia ENT: no sore throat, no nodules palpated in neck, no dysphagia, no odynophagia, no hoarseness Cardiovascular: no CP/no  SOB/no palpitations/no leg swelling Respiratory: no cough/no SOB/no wheezing Gastrointestinal: no N/no V/no D/no C/no acid reflux Musculoskeletal: no muscle aches/no joint aches Skin: no rashes, no hair loss Neurological: no tremors/+ numbness/+ tingling/no dizziness  I reviewed pt's medications, allergies, PMH, social hx, family hx, and changes were documented in the history of present illness. Otherwise, unchanged from my initial visit note.  Past Medical History:  Diagnosis Date  . Allergy   . Arthritis   . Atrial fibrillation ([place])    controlled with amiodarone, on coumadin  . Chronic renal  insufficiency   . Chronic systolic heart failure ([place])   . Congestive heart failure ([place])   . Coronary artery disease 01/31/2011  . Diabetes mellitus    type 1  . 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   . History of chicken pox   . Hyperlipidemia   . Hypertension   . Ischemic cardiomyopathy   . Lumbar spondylosis 01/11/2012  . Sleep apnea   . Stroke [facility]) 2010   eye doctor said she had TIA  . Ventricular tachycardia ([place])    Polymorphic   Past Surgical History:  Procedure Laterality Date  . ABDOMINAL HYSTERECTOMY  1995  . CARDIAC DEFIBRILLATOR PLACEMENT    . CHOLECYSTECTOMY  1985  . COLONOSCOPY WITH PROPOFOL N/A 02/24/2018   Procedure: COLONOSCOPY WITH PROPOFOL;  Surgeon: Milus Banister, MD;  Location: [facility] ENDOSCOPY;  Service: Endoscopy;  Laterality: N/A;  . ICD  2003/2007   implanted by Dr Rollene Fare, most recent generator change 2/13 by Dr Rayann Heman, Analyze ST study patient  . IMPLANTABLE CARDIOVERTER DEFIBRILLATOR (ICD) GENERATOR CHANGE N/A 05/05/2011   Procedure: ICD GENERATOR CHANGE;  Surgeon: Thompson Grayer, MD;  Location: [facility] CATH LAB;  Service: Cardiovascular;  Laterality: N/A;  . LEFT HEART CATHETERIZATION WITH CORONARY ANGIOGRAM N/A 01/30/2011   Procedure: LEFT HEART CATHETERIZATION WITH CORONARY ANGIOGRAM;  Surgeon: Larey Dresser, MD;  Location[facility] C[facility] LAB;  Service: Cardiovascular;  Laterality: N/A;  . POLYPECTOMY  02/24/2018   Procedure: POLYPECTOMY;  Surgeon: Milus Banister, MD;  Location: [facility] ENDOSCOPY;  Service: Endoscopy;;  . [place] History   Socioeconomic History  . Marital status: Divorced    Spouse name: Not on file  . Number of children: 3  . Years of education: 17  . Highest education level: Not on file  Occupational History  . Occupation: Retried  Tobacco Use  . Smoking status: Former Smoker    Quit date: 08/16/1995    Years since quitting: 23.6  . Smokeless tobacco: Never Used  Substance and Sexual Activity  . Alcohol  use: No  . Drug use: No  . Sexual activity: Never    Birth control/protection: Post-menopausal  Other Topics Concern  . Not on file  Social History Narrative   Regular exercise-no   Caffeine Use-no   Social Determinants of Health   Financial Resource Strain:   . Difficulty of Paying Living Expenses: Not on file  Food Insecurity:   . Worried About Charity fundraiser in the Last Year: Not on file  . Ran Out of Food in the Last Year: Not on file  Transportation Needs:   . Lack of Transportation (Medical): Not on file  . Lack of Transportation (Non-Medical): Not on file  Physical Activity:   . Days of Exercise per Week: Not on file  . Minutes of Exercise per Session: Not on file  Stress:   . Feeling of Stress : Not on file  Social Connections:   . Frequency of Communication with Friends and Family: Not on file  . Frequency of Social Gatherings with Friends and Family: Not on file  . Attends Religious Services: Not on file  . Active Member of Clubs or Organizations: Not on file  . Attends Archivist Meetings: Not on file  . Marital Status: Not on file  Intimate Partner Violence:   . Fear of Current or Ex-Partner: Not on file  . Emotionally Abused: Not on file  . Physically Abused: Not on file  . Sexually Abused: Not on file   Current Outpatient Medications on File Prior to Visit  Medication Sig Dispense Refill  . acetaminophen (TYLENOL) 500 MG tablet Take 500 mg by mouth daily as needed for moderate pain or headache.    [name] amiodarone (PACERONE) 100 MG tablet TAKE 1 TABLET BY MOUTH EVERY DAY 90 tablet 0  . Blood Glucose Monitoring Suppl (ACCU-CHEK NANO SMARTVIEW) W/DEVICE KIT Use to test blood sugar 4 times daily as instructed. Dx code: E11.59 1 kit 0  . carvedilol (COREG) 25 MG tablet Take 1 tablet (25 mg total) by mouth 2 (two) times daily with a meal. Please cancel all previous orders for current medication. Change in dosage or pill size. 60 tablet 6  .  Cholecalciferol (VITAMIN D3) 2000 UNITS TABS Take 2,000 Units by mouth daily.     [name] dicyclomine (BENTYL) 10 MG capsule TAKE 1 CAPSULE (10 MG TOTAL) BY MOUTH 4 (FOUR) TIMES DAILY BEFORE MEALS AND AT BEDTIME. 120 capsule 1  . ELIQUIS 5 MG TABS tablet TAKE 1 TABLET BY MOUTH TWICE A DAY 60 tablet 11  . furosemide (LASIX) 20 MG tablet TAKE 1 TABLET BY MOUTH EVERY DAY (Patient taking differently: every other day. ) 90 tablet 1  . gabapentin (NEURONTIN) 400 MG capsule TAKE 1 CAPSULE (400 MG TOTAL) BY MOUTH 2 (TWO) TIMES DAILY. 180 capsule 1  . glucose blood (ACCU-CHEK SMARTVIEW) test strip Use to test blood sugar 4 times daily as instructed. Dx code E11.59 200 each 11  . LEVEMIR FLEXTOUCH 100 UNIT/ML Pen INJECT 40 UNITS INTO THE SKIN DAILY AT 10 PM. (Patient taking differently: Inject 35 Units into the skin at bedtime. ) 15 mL 1  . levothyroxine (SYNTHROID) 25 MCG tablet TAKE 1 TABLET (25 MCG TOTAL) BY MOUTH DAILY BEFORE BREAKFAST. 90 tablet 1  . magnesium oxide (MAG-OX) 400 MG tablet TAKE 1 TABLET BY MOUTH TWICE A DAY 180 tablet 0  . metFORMIN (GLUCOPHAGE-XR) 500 MG 24 hr tablet Take 3 tablets (1,500 mg total) by mouth daily with supper. 270 tablet 3  . Multiple Vitamins-Minerals (EYE VITAMINS PO) Take 1 tablet by mouth 2 (two) times daily.     . nitroGLYCERIN (NITROSTAT) 0.4 MG SL tablet Place 1 tablet (0.4 mg total) under the tongue every 5 (five) minutes as needed for chest pain. 25 tablet 1  . NOVOLOG 100 UNIT/ML injection INJECT 10-14 UNITS INTO THE SKIN 3 (THREE) TIMES DAILY BEFORE MEALS. 10 mL 2  . omeprazole (PRILOSEC) 20 MG capsule TAKE 1 CAPSULE BY MOUTH EVERY DAY 90 capsule 1  . rosuvastatin (CRESTOR) 20 MG tablet TAKE 1 TABLET BY MOUTH EVERY DAY 90 tablet 3  . sacubitril-valsartan (ENTRESTO) 97-103 MG Take 1 tablet by mouth 2 (two) times daily. 60 tablet 11  . traMADol (ULTRAM) 50 MG tablet TAKE 1 TABLET BY MOUTH EVERY 6 HOURS AS NEEDED FOR PAIN 120 tablet 2   No current facility-administered  medications on file  prior to visit.   Allergies  Allergen Reactions  . Veltassa [Patiromer] Diarrhea  . Darvon Other (See Comments)    indigestion  . Promethazine Hcl Other (See Comments)    hyperactivity  . Spironolactone Diarrhea and Nausea And Vomiting  . Plavix [Clopidogrel Bisulfate] Rash   Family History  Problem Relation Age of Onset  . Diabetes Mother   . Heart disease Mother   . Hyperlipidemia Mother   . Hypertension Mother   . Heart disease Father   . Heart attack Father   . Hypertension Father   . Early death Brother 34  . Breast cancer Sister   . Lung cancer Sister   . Irritable bowel syndrome Sister   . Colon cancer Maternal Aunt   . Esophageal cancer Neg Hx   . Colon polyps Neg Hx     PE: There were no vitals taken for this visit. There is no height or weight on file to calculate BMI.  Wt Readings from Last 3 Encounters:  01/10/19 205 lb 9.6 oz (93.3 kg)  11/23/18 204 lb (92.5 kg)  08/16/18 204 lb 9.6 oz (92.8 kg)   Constitutional:  in NAD  The physical exam was not performed (telephone visit).  ASSESSMENT: 1. DM2, insulin-dependent, controlled, with complications - CAD, ICM - s/p ICD placement - CKD - PN - on neurontin - gastroparesis per GES 06/18/2012 >> 60 minutes: 92%, 120 minutes: 82%  2. 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 HL  PLAN:  1. Patient with longstanding, previously uncontrolled type 2 diabetes, on Metformin and basal-bolus insulin regimen.  At last visit, sugars were worse with more spikes in the 200s, possibly related to significant stress with many family members dying recently and also snacking between meals.  We discussed about stopping the snacks.  However, she was also having occasional low blood sugars in the 50s at night so we removed Levemir in the morning.  I also advised her to move Metformin with dinner. - at this visit, sugars are still above goal, but upon questioning, she is taking less Novolog that advised with L and D >> sugars are  above target before dinner and at bedtime. We will increase the Novolog with these meals and I advised her to let me know if the sugars are dropping afterwards as, in that situation, we will need to decrease Levemir rather than NovoLog.  Also, she tells me that she moved her Levemir back to bedtime and, as she prefers to take it like this, we will continue  - I suggested to: Patient Instructions  Please continue: - Metformin XR 1500 mg with dinner - Levemir 35 units at bedtime  Please increase: - Novolog: 8-12 units before meal  Please return in 3-4 months with your sugar log.   - advised to check sugars at different times of the day - 3x a day, rotating check times - advised for yearly eye exams >> she is UTD - return to clinic in 3-4 months    2. PN -This appears to be stable, she has no new complaints -She continues on Neurontin without side effects.  3. HL -Reviewed latest lipid panel from 07/2018 and all fractions were at goal Lab Results  Component Value Date   CHOL 147 08/16/2018   HDL 49.80 08/16/2018   LDLCALC 81 08/16/2018   TRIG 85.0 08/16/2018   CHOLHDL 3 08/16/2018  -Continues Crestor without side effects  - time spent with the patient: 18 min, of which >50% was spent  in obtaining information about her symptoms, reviewing her previous labs, evaluations, and treatments, counseling her about her conditions (please see the discussed topics above), and developing a plan to further investigate and treat them; she had a number of questions which I addressed.   [name] Kingdom, MD PhD [facility]

## 2019-04-05 DIAGNOSIS — M19012 Primary osteoarthritis, left shoulder: Secondary | ICD-10-CM | POA: Diagnosis not present

## 2019-04-05 DIAGNOSIS — M25562 Pain in left knee: Secondary | ICD-10-CM | POA: Diagnosis not present

## 2019-04-06 ENCOUNTER — Other Ambulatory Visit: Payer: Self-pay | Admitting: Internal Medicine

## 2019-04-16 ENCOUNTER — Other Ambulatory Visit: Payer: Self-pay | Admitting: Internal Medicine

## 2019-04-16 NOTE — Telephone Encounter (Signed)
Please refill as per office routine med refill policy (all routine meds refilled for 3 mo or monthly per pt preference up to one year from last visit, then month to month grace period for 3 mo, then further med refills will have to be denied)  

## 2019-04-18 ENCOUNTER — Ambulatory Visit (INDEPENDENT_AMBULATORY_CARE_PROVIDER_SITE_OTHER): Payer: Medicare PPO | Admitting: *Deleted

## 2019-04-18 DIAGNOSIS — I5022 Chronic systolic (congestive) heart failure: Secondary | ICD-10-CM

## 2019-04-18 LAB — CUP PACEART REMOTE DEVICE CHECK
Battery Remaining Longevity: 13 mo
Battery Remaining Percentage: 13 %
Battery Voltage: 2.71 V
Brady Statistic AP VP Percent: 2 %
Brady Statistic AP VS Percent: 89 %
Brady Statistic AS VP Percent: 1 %
Brady Statistic AS VS Percent: 8.5 %
Brady Statistic RA Percent Paced: 86 %
Brady Statistic RV Percent Paced: 2.9 %
Date Time Interrogation Session: 20210125032231
HighPow Impedance: 50 Ohm
Implantable Lead Implant Date: 20070117
Implantable Lead Implant Date: 20070117
Implantable Lead Location: 753859
Implantable Lead Location: 753860
Implantable Lead Model: 7040
Implantable Pulse Generator Implant Date: 20130212
Lead Channel Impedance Value: 460 Ohm
Lead Channel Impedance Value: 660 Ohm
Lead Channel Pacing Threshold Amplitude: 0.5 V
Lead Channel Pacing Threshold Amplitude: 1 V
Lead Channel Pacing Threshold Pulse Width: 0.5 ms
Lead Channel Pacing Threshold Pulse Width: 0.5 ms
Lead Channel Sensing Intrinsic Amplitude: 11.8 mV
Lead Channel Sensing Intrinsic Amplitude: 5 mV
Lead Channel Setting Pacing Amplitude: 2 V
Lead Channel Setting Pacing Amplitude: 2.5 V
Lead Channel Setting Pacing Pulse Width: 0.5 ms
Lead Channel Setting Sensing Sensitivity: 0.5 mV
Pulse Gen Serial Number: 819806

## 2019-04-19 ENCOUNTER — Ambulatory Visit: Payer: Medicare PPO | Admitting: Internal Medicine

## 2019-04-19 DIAGNOSIS — M7542 Impingement syndrome of left shoulder: Secondary | ICD-10-CM | POA: Diagnosis not present

## 2019-04-19 DIAGNOSIS — M19012 Primary osteoarthritis, left shoulder: Secondary | ICD-10-CM | POA: Diagnosis not present

## 2019-04-23 ENCOUNTER — Other Ambulatory Visit (HOSPITAL_COMMUNITY): Payer: Self-pay | Admitting: Cardiology

## 2019-05-15 ENCOUNTER — Encounter (HOSPITAL_COMMUNITY): Payer: Medicare PPO | Admitting: Cardiology

## 2019-05-23 ENCOUNTER — Ambulatory Visit (HOSPITAL_COMMUNITY)
Admission: RE | Admit: 2019-05-23 | Discharge: 2019-05-23 | Disposition: A | Discharge status: Home / Self Care | Payer: Medicare PPO | Source: Ambulatory Visit | Attending: Cardiology | Admitting: Cardiology

## 2019-05-23 ENCOUNTER — Encounter (HOSPITAL_COMMUNITY): Payer: Self-pay

## 2019-05-23 ENCOUNTER — Other Ambulatory Visit: Payer: Self-pay

## 2019-05-23 DIAGNOSIS — Z955 Presence of coronary angioplasty implant and graft: Secondary | ICD-10-CM | POA: Insufficient documentation

## 2019-05-23 DIAGNOSIS — I480 Paroxysmal atrial fibrillation: Secondary | ICD-10-CM | POA: Insufficient documentation

## 2019-05-23 DIAGNOSIS — Z794 Long term (current) use of insulin: Secondary | ICD-10-CM | POA: Insufficient documentation

## 2019-05-23 DIAGNOSIS — Z79890 Hormone replacement therapy (postmenopausal): Secondary | ICD-10-CM | POA: Diagnosis not present

## 2019-05-23 DIAGNOSIS — I255 Ischemic cardiomyopathy: Secondary | ICD-10-CM | POA: Insufficient documentation

## 2019-05-23 DIAGNOSIS — K3184 Gastroparesis: Secondary | ICD-10-CM | POA: Insufficient documentation

## 2019-05-23 DIAGNOSIS — E1143 Type 2 diabetes mellitus with diabetic autonomic (poly)neuropathy: Secondary | ICD-10-CM | POA: Insufficient documentation

## 2019-05-23 DIAGNOSIS — Z8249 Family history of ischemic heart disease and other diseases of the circulatory system: Secondary | ICD-10-CM | POA: Diagnosis not present

## 2019-05-23 DIAGNOSIS — Z95810 Presence of automatic (implantable) cardiac defibrillator: Secondary | ICD-10-CM | POA: Insufficient documentation

## 2019-05-23 DIAGNOSIS — I5022 Chronic systolic (congestive) heart failure: Secondary | ICD-10-CM

## 2019-05-23 DIAGNOSIS — N183 Chronic kidney disease, stage 3 unspecified: Secondary | ICD-10-CM | POA: Diagnosis not present

## 2019-05-23 DIAGNOSIS — Z87891 Personal history of nicotine dependence: Secondary | ICD-10-CM | POA: Insufficient documentation

## 2019-05-23 DIAGNOSIS — E1122 Type 2 diabetes mellitus with diabetic chronic kidney disease: Secondary | ICD-10-CM | POA: Diagnosis not present

## 2019-05-23 DIAGNOSIS — Z7901 Long term (current) use of anticoagulants: Secondary | ICD-10-CM | POA: Insufficient documentation

## 2019-05-23 DIAGNOSIS — E039 Hypothyroidism, unspecified: Secondary | ICD-10-CM | POA: Insufficient documentation

## 2019-05-23 DIAGNOSIS — Z79899 Other long term (current) drug therapy: Secondary | ICD-10-CM | POA: Insufficient documentation

## 2019-05-23 DIAGNOSIS — I130 Hypertensive heart and chronic kidney disease with heart failure and stage 1 through stage 4 chronic kidney disease, or unspecified chronic kidney disease: Secondary | ICD-10-CM | POA: Diagnosis not present

## 2019-05-23 DIAGNOSIS — I472 Ventricular tachycardia: Secondary | ICD-10-CM | POA: Diagnosis not present

## 2019-05-23 DIAGNOSIS — I2510 Atherosclerotic heart disease of native coronary artery without angina pectoris: Secondary | ICD-10-CM | POA: Insufficient documentation

## 2019-05-23 DIAGNOSIS — Z8673 Personal history of transient ischemic attack (TIA), and cerebral infarction without residual deficits: Secondary | ICD-10-CM | POA: Diagnosis not present

## 2019-05-23 MED ORDER — FUROSEMIDE 20 MG PO TABS: ORAL_TABLET | ORAL | 3 refills | Status: DC

## 2019-05-23 NOTE — Progress Notes (Signed)
Called patient to review AVS.  No answer, went to VM.  LM for patient to return call. Will make another attempt. AVS and RX for labs mailed and sent via mychart.

## 2019-05-23 NOTE — Addendum Note (Signed)
Encounter addended by: Larey Dresser, MD on: 05/23/2019 11:43 PM  Actions taken: Charge Capture section accepted

## 2019-05-23 NOTE — Patient Instructions (Addendum)
Labs to be done in 1 week at PCP office: TSH, CMET, BNP Please fax results to [phone] We will only contact you if something comes back abnormal or we need to make some changes. Otherwise no news is good news!  INCREASE Lasix to 40mg  (2 tabs) daily for 3 days THEN down to 20mg  (1 tab) daily after that  Your physician has requested that you have an echocardiogram. Echocardiography is a painless test that uses sound waves to create images of your heart. It provides your doctor with information about the size and shape of your heart and how well your heart's chambers and valves are working. This procedure takes approximately one hour. There are no restrictions for this procedure.   Your physician recommends that you schedule a follow-up appointment in: 1 month with Dr Aundra Dubin and an ECHO.  Appt date: Tuesday, April 6th, 2021 at 11:15pm ECHO Tuesday April 6th, 2021 at 12:20pm Dr Aundra Dubin    At the [facility], you and your health needs are our priority. As part of our continuing mission to provide you with exceptional heart care, we have created designated Provider Care Teams. These Care Teams include your primary Cardiologist (physician) and Advanced Practice Providers (APPs- Physician Assistants and Nurse Practitioners) who all work together to provide you with the care you need, when you need it.   You may see any of the following providers on your designated Care Team at your next follow up: [name] Dr Glori Bickers . Dr Loralie Champagne . Darrick Grinder, NP . Lyda Jester, PA . Audry Riles, PharmD   Please be sure to bring in all your medications bottles to every appointment.

## 2019-05-23 NOTE — Progress Notes (Signed)
Heart Failure TeleHealth Note  Due to national recommendations of social distancing due to [place] 19, Audio/video telehealth visit is felt to be most appropriate for this patient at this time.  See MyChart message from today for patient consent regarding telehealth for [facility].  Date:  05/23/2019   ID:  [name], DOB 1945-09-08, MRN [id number]  Location: Home  Provider location: [facility] Type of Visit: Established patient   PCP:  Biagio Borg, MD  Cardiologist:  Loralie Champagne, MD  Chief Complaint: Shortness of breath   History of Present Illness: [name] is a 74 y.o. female who presents via audio/video conferencing for a telehealth visit today.     she denies symptoms worrisome for COVID 19.   She has a history of CAD, ischemic cardiomyopathy, and atrial fibrillation.  Patient was hospitalized in 11/12 at [facility] for VT with ICD discharge.  She had a left heart cath showing patent stents.  EF was 35-40% by echo.  She is on amiodarone.  She has had periodic problems with creatinine rising with medication adjustments.  Echo in 12/18 showed EF 35-40%, inferior and septal hypokinesis, mild MR.    Echo in 5/20 showed EF 40%, normal RV, mild AI.   She did not tolerate eplerenone due to nausea.  She is currently living with her daughter in [place]. She has noted ankle swelling recently and says that her weight is up 4-5 lbs.  No chest pain.  No lightheadedness.  Mild orthopnea.  She is short of breath with moderate exertion like walking up hills or inclines.   Labs (10/12): LDL 73, HDL 44 Labs (11/12): K 3.9, creatinine 1.1, LFTs normal, TSH normal Labs (12/12): K 3.9, creatinine 1.5, proBNP 18 Labs (1/13): LDL 82, HDL 57 Labs (2/13): K 4.3, creatinine 1.5 Labs (5/13): creatinine 2.4 => 1.7, LFTs normal, TSH normal, proBNP 18 Labs (6/13): K 4.2, creatinine 1.7 Labs (10/13): K 4.3, creatinine 1.4 Labs (4/14); LFTs normal, TSH normal, LDL  71, HDL 58 Labs (5/14): K 4.5, creatinine 1.4 Labs (8/14): TSH normal, LFTs normal Labs (9/14): K 4.2, creatinine 1.8, LDL 75, HDL 54 Labs (07/31/13) K 3.7 Creatinine 1.8 BNP 148  Labs (9/15): K 4.3, creatinine 1.24, LFTs normal, LDL 81, HDL 58, TSH normal Labs (9/16): K 4.4, creatinine 1.44, LDL 64, HDL 54, LFTs normal, TSH normal, HCT 38 Labs (2/17): K 4.9, creatinine 1.76, HCT 38.6 Labs (1/19): LDL 71, HDL 58, K 4.8, creatinine 1.26, LFTs normal, hgb 12.9.  Labs (2/19): Creatinine 1.35, K 4.9 Labs (6/19): K 4.4, creatinine 1.57 Labs (8/19): K 5, creatinine 1.59, LFTs normal Labs (2/20): K 4.4, creatinine 1.4, LDL 72, HDL 55, normal LFTs Labs (5/20): LDL 81, K 4.8, creatinine 1.69 Labs (10/20): hgb 13.6, K 4.2, creatinine 1.62, LFTs normal, LDL 81, HDL 50  PMH: 1. Diabetes mellitus 2. CVA, TIA in 2010 3. HTN 4. CKD stage 3 5. H/o TAH 6. H/o CCY 7. Sciatica 8. Atrial fibrillation 9. CAD: s/p LAD and RCA PCI.  Last LHC in 11/12 with patent proximal LAD stent, ostial 70% D1 (jailed by stent), mild LAD stent patent, patent RCA stents, EF 40% with global hypokinesis.  10. Ischemic cardiomyopathy: Echo (10/12): EF 35-40%, moderate focal basal septal hypertrophy, inferior akinesis, grade I diastolic dysfunction, moderate aortic insufficiency. St Jude dual chamber ICD.  Spironolactone stopped when creatinine rose to 2.5.  Echo (5/14) with EF 40-45%, mild AI.  Echo (6/16) with EF 40-45%, inferior/inferoseptal  hypokinesis, mild LVH.  - Echo (12/18): EF 35-40%, inferior and septal hypokinesis, mild MR, mild AI.  - Echo (5/20): EF 40%, inferior and inferoseptal hypokinesis, normal RV size and systolic function, mild AI, normal IVC.  - Unable to tolerate eplerenone.  11. Aortic insufficiency: moderate in past but mild on most recent echo.   12. 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  13. Gastroparesis 14. Hypothyroidism  SH: Divorced.  3 children.  Quit smoking in 1997. Lives in [place] now with  daughter.  [place].   FH: CAD  ROS: All systems reviewed and negative except as per HPI.   Current Outpatient Medications  Medication Sig Dispense Refill  . acetaminophen (TYLENOL) 500 MG tablet Take 500 mg by mouth daily as needed for moderate pain or headache.    [name] amiodarone (PACERONE) 100 MG tablet TAKE 1 TABLET BY MOUTH EVERY DAY 90 tablet 0  . Blood Glucose Monitoring Suppl (ACCU-CHEK NANO SMARTVIEW) W/DEVICE KIT Use to test blood sugar 4 times daily as instructed. Dx code: E11.59 1 kit 0  . carvedilol (COREG) 25 MG tablet Take 1 tablet (25 mg total) by mouth 2 (two) times daily with a meal. Please cancel all previous orders for current medication. Change in dosage or pill size. 60 tablet 6  . Cholecalciferol (VITAMIN D3) 2000 UNITS TABS Take 2,000 Units by mouth daily.     [name] dicyclomine (BENTYL) 10 MG capsule TAKE 1 CAPSULE (10 MG TOTAL) BY MOUTH 4 (FOUR) TIMES DAILY BEFORE MEALS AND AT BEDTIME. 120 capsule 1  . ELIQUIS 5 MG TABS tablet TAKE 1 TABLET BY MOUTH TWICE A DAY 60 tablet 6  . furosemide (LASIX) 20 MG tablet Take 37m (2 tabs) daily for 3 days, THEN 254m(1 tab) daily after that 90 tablet 3  . gabapentin (NEURONTIN) 400 MG capsule TAKE 1 CAPSULE (400 MG TOTAL) BY MOUTH 2 (TWO) TIMES DAILY. 180 capsule 1  . glucose blood (ACCU-CHEK SMARTVIEW) test strip Use to test blood sugar 4 times daily as instructed. Dx code E11.59 200 each 11  . LEVEMIR FLEXTOUCH 100 UNIT/ML Pen INJECT 40 UNITS INTO THE SKIN DAILY AT 10 PM. (Patient taking differently: Inject 35 Units into the skin at bedtime. ) 15 mL 1  . levothyroxine (SYNTHROID) 25 MCG tablet TAKE 1 TABLET (25 MCG TOTAL) BY MOUTH DAILY BEFORE BREAKFAST. 90 tablet 0  . magnesium oxide (MAG-OX) 400 MG tablet TAKE 1 TABLET BY MOUTH TWICE A DAY 180 tablet 0  . metFORMIN (GLUCOPHAGE-XR) 500 MG 24 hr tablet Take 3 tablets (1,500 mg total) by mouth daily with supper. 270 tablet 3  . Multiple Vitamins-Minerals (EYE VITAMINS PO) Take 1  tablet by mouth 2 (two) times daily.     . nitroGLYCERIN (NITROSTAT) 0.4 MG SL tablet Place 1 tablet (0.4 mg total) under the tongue every 5 (five) minutes as needed for chest pain. 25 tablet 1  . NOVOLOG 100 UNIT/ML injection INJECT 10-14 UNITS INTO THE SKIN 3 (THREE) TIMES DAILY BEFORE MEALS. 10 mL 2  . omeprazole (PRILOSEC) 20 MG capsule TAKE 1 CAPSULE BY MOUTH EVERY DAY 90 capsule 1  . rosuvastatin (CRESTOR) 20 MG tablet TAKE 1 TABLET BY MOUTH EVERY DAY 90 tablet 3  . sacubitril-valsartan (ENTRESTO) 97-103 MG Take 1 tablet by mouth 2 (two) times daily. 60 tablet 11  . traMADol (ULTRAM) 50 MG tablet TAKE 1 TABLET BY MOUTH EVERY 6 HOURS AS NEEDED FOR PAIN 120 tablet 2   No current facility-administered medications for this  encounter.   (Video/Tele Health Call; Exam is subjective and or/visual.) General:  Speaks in full sentences. No resp difficulty. Lungs: Normal respiratory effort with conversation.  Abdomen: Non-distended per patient report Extremities: Pt reports bilateral ankle edema.  Neuro: Alert & oriented x 3.   Assessment/Plan 1. Atrial fibrillation: Paroxysmal.  Recent device check showed about 3.4% atrial fibrillation burden.  - Continue amiodarone 100 mg daily.  I will arrange for LFTs to be done. She is on Levoxyl followed by PCP. She will need regular eye exams.  - Continue Eliquis 5 mg bid.  2. Coronary artery disease: No chest pain.  Nonobstructive disease on last cath.  - Continue Crestor 20 mg daily.    3. Ventricular tachycardia - On amiodarone 100 mg daily with suppression of VT. 4. Chronic systolic CHF: Ischemic cardiomyopathy.  EF 40% on 5/20 echo. She has a Research officer, political party ICD. NYHA class II-III symptoms.  She has gained weight and has peripheral edema, concerning for volume overload.  - Continue Entresto 97/103 mg BID  - Continue Coreg 25 mg bid.  - Increase Lasix to 40 mg daily x 3 days, then 20 mg daily after that.  Check BMET in about a week.  - She has tolerated  neither spironolactone nor eplerenone.  - I will arrange for echo at followup appt in 1 month.  5. CKD stage 3: BMET in a week or so.  6. Hypothyroidism: Managed by PCP.   COVID screen The patient does not have any symptoms that suggest any further testing/ screening at this time.  Social distancing reinforced today.  Patient Risk: After full review of this patients clinical status, I feel that they are at moderate risk for cardiac decompensation at this time.  Relevant cardiac medications were reviewed at length with the patient today. The patient does not have concerns regarding their medications at this time.   Recommended follow-up:  1 month with echo  Today, I have spent 17 minutes with the patient with telehealth technology discussing the above issues .    Signed, Loralie Champagne, MD  05/23/2019  [place] [address] [facility] [place] [address] [phone] (office) [phone] (fax)

## 2019-05-23 NOTE — Progress Notes (Signed)
Spoke with patient, AVS discussed. Verbalized understanding

## 2019-05-26 ENCOUNTER — Other Ambulatory Visit: Payer: Self-pay | Admitting: Internal Medicine

## 2019-05-31 ENCOUNTER — Other Ambulatory Visit (HOSPITAL_COMMUNITY): Payer: Self-pay | Admitting: Cardiology

## 2019-05-31 NOTE — Telephone Encounter (Addendum)
Patient called triage to request refill of amio  Refills addressed and returned to pharmacy

## 2019-06-01 MED ORDER — AMIODARONE HCL 100 MG PO TABS: 100.0000 mg | ORAL_TABLET | Freq: Every day | ORAL | 0 refills | Status: DC

## 2019-06-06 NOTE — Progress Notes (Deleted)
Electrophysiology Office Note Date: 06/06/2019  ID:  [name], [name] [birth year], MRN [id number]  PCP: Biagio Borg, MD Primary Cardiologist: Aundra Dubin Electrophysiologist: Allred  CC: Routine ICD follow-up  [name] is a 74 y.o. female seen today for Dr Rayann Heman.  She presents today for routine electrophysiology followup.  Since last being seen in our clinic, the patient reports doing very well. She denies chest pain, palpitations, dyspnea, PND, orthopnea, nausea, vomiting, dizziness, syncope, edema, weight gain, or early satiety.  She has not had ICD shocks.      Past Medical History:  Diagnosis Date  . Allergy   . Arthritis   . Atrial fibrillation ([place])    controlled with amiodarone, on coumadin  . Chronic renal insufficiency   . Chronic systolic heart failure ([place])   . Congestive heart failure ([place])   . Coronary artery disease 01/31/2011  . Diabetes mellitus    type 1  . 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   . History of chicken pox   . Hyperlipidemia   . Hypertension   . Ischemic cardiomyopathy   . Lumbar spondylosis 01/11/2012  . Sleep apnea   . Stroke [facility]) 2010   eye doctor said she had TIA  . Ventricular tachycardia ([place])    Polymorphic   Past Surgical History:  Procedure Laterality Date  . ABDOMINAL HYSTERECTOMY  1995  . CARDIAC DEFIBRILLATOR PLACEMENT    . CHOLECYSTECTOMY  1985  . COLONOSCOPY WITH PROPOFOL N/A 02/24/2018   Procedure: COLONOSCOPY WITH PROPOFOL;  Surgeon: Milus Banister, MD;  Location: [facility] ENDOSCOPY;  Service: Endoscopy;  Laterality: N/A;  . ICD  2003/2007   implanted by Dr Rollene Fare, most recent generator change 2/13 by Dr Rayann Heman, Analyze ST study patient  . IMPLANTABLE CARDIOVERTER DEFIBRILLATOR (ICD) GENERATOR CHANGE N/A 05/05/2011   Procedure: ICD GENERATOR CHANGE;  Surgeon: Thompson Grayer, MD;  [facility] C[facility] LAB;  Service: Cardiovascular;  Laterality: N/A;  . LEFT HEART CATHETERIZATION WITH CORONARY  ANGIOGRAM N/A 01/30/2011   Procedure: LEFT HEART CATHETERIZATION WITH CORONARY ANGIOGRAM;  Surgeon: Larey Dresser, MD;  Location: [facility] CATH LAB;  Service: Cardiovascular;  Laterality: N/A;  . POLYPECTOMY  02/24/2018   Procedure: POLYPECTOMY;  Surgeon: Milus Banister, MD;  Location: [facility] ENDOSCOPY;  Service: Endoscopy;;  . Leta [place]  1980    Current Outpatient Medications  Medication Sig Dispense Refill  . acetaminophen (TYLENOL) 500 MG tablet Take 500 mg by mouth daily as needed for moderate pain or headache.    [name] amiodarone (PACERONE) 100 MG tablet Take 1 tablet (100 mg total) by mouth daily. 90 tablet 0  . Blood Glucose Monitoring Suppl (ACCU-CHEK NANO SMARTVIEW) W/DEVICE KIT Use to test blood sugar 4 times daily as instructed. Dx code: E11.59 1 kit 0  . carvedilol (COREG) 25 MG tablet Take 1 tablet (25 mg total) by mouth 2 (two) times daily with a meal. Please cancel all previous orders for current medication. Change in dosage or pill size. 60 tablet 6  . Cholecalciferol (VITAMIN D3) 2000 UNITS TABS Take 2,000 Units by mouth daily.     [name] dicyclomine (BENTYL) 10 MG capsule TAKE 1 CAPSULE (10 MG TOTAL) BY MOUTH 4 (FOUR) TIMES DAILY BEFORE MEALS AND AT BEDTIME. 120 capsule 1  . ELIQUIS 5 MG TABS tablet TAKE 1 TABLET BY MOUTH TWICE A DAY 60 tablet 6  . furosemide (LASIX) 20 MG tablet Take '40mg'$  (2 tabs) daily for 3 days, THEN '20mg'$  (1 tab) daily after that  90 tablet 3  . gabapentin (NEURONTIN) 400 MG capsule TAKE 1 CAPSULE (400 MG TOTAL) BY MOUTH 2 (TWO) TIMES DAILY. 180 capsule 1  . glucose blood (ACCU-CHEK SMARTVIEW) test strip Use to test blood sugar 4 times daily as instructed. Dx code E11.59 200 each 11  . LEVEMIR FLEXTOUCH 100 UNIT/ML Pen INJECT 40 UNITS INTO THE SKIN DAILY AT 10 PM. (Patient taking differently: Inject 35 Units into the skin at bedtime. ) 15 mL 1  . levothyroxine (SYNTHROID) 25 MCG tablet TAKE 1 TABLET (25 MCG TOTAL) BY MOUTH DAILY BEFORE BREAKFAST. 90 tablet 0  . magnesium  oxide (MAG-OX) 400 MG tablet TAKE 1 TABLET BY MOUTH TWICE A DAY 180 tablet 0  . metFORMIN (GLUCOPHAGE-XR) 500 MG 24 hr tablet Take 3 tablets (1,500 mg total) by mouth daily with supper. 270 tablet 3  . Multiple Vitamins-Minerals (EYE VITAMINS PO) Take 1 tablet by mouth 2 (two) times daily.     . nitroGLYCERIN (NITROSTAT) 0.4 MG SL tablet Place 1 tablet (0.4 mg total) under the tongue every 5 (five) minutes as needed for chest pain. 25 tablet 1  . NOVOLOG 100 UNIT/ML injection INJECT 10-14 UNITS INTO THE SKIN 3 (THREE) TIMES DAILY BEFORE MEALS. 10 mL 2  . omeprazole (PRILOSEC) 20 MG capsule TAKE 1 CAPSULE BY MOUTH EVERY DAY 90 capsule 1  . rosuvastatin (CRESTOR) 20 MG tablet TAKE 1 TABLET BY MOUTH EVERY DAY 90 tablet 3  . sacubitril-valsartan (ENTRESTO) 97-103 MG Take 1 tablet by mouth 2 (two) times daily. 60 tablet 11  . traMADol (ULTRAM) 50 MG tablet TAKE 1 TABLET BY MOUTH EVERY 6 HOURS AS NEEDED FOR PAIN 120 tablet 2   No current facility-administered medications for this visit.    Allergies:   Veltassa [patiromer], Darvon, Promethazine hcl, Spironolactone, and Plavix [clopidogrel bisulfate]   Social History: Social History   Socioeconomic History  . Marital status: Divorced    Spouse name: Not on file  . Number of children: 3  . Years of education: 56  . Highest education level: Not on file  Occupational History  . Occupation: Retried  Tobacco Use  . Smoking status: Former Smoker    Quit date: 08/16/1995    Years since quitting: 23.8  . Smokeless tobacco: Never Used  Substance and Sexual Activity  . Alcohol use: No  . Drug use: No  . Sexual activity: Never    Birth control/protection: Post-menopausal  Other Topics Concern  . Not on file  Social History Narrative   Regular exercise-no   Caffeine Use-no   Social Determinants of Health   Financial Resource Strain:   . Difficulty of Paying Living Expenses:   Food Insecurity:   . Worried About Charity fundraiser in the  Last Year:   . Arboriculturist in the Last Year:   Transportation Needs:   . Film/video editor (Medical):   [name] Lack of Transportation (Non-Medical):   Physical Activity:   . Days of Exercise per Week:   . Minutes of Exercise per Session:   Stress:   . Feeling of Stress :   Social Connections:   . Frequency of Communication with Friends and Family:   . Frequency of Social Gatherings with Friends and Family:   . Attends Religious Services:   . Active Member of Clubs or Organizations:   . Attends Archivist Meetings:   [name] Marital Status:   Intimate Partner Violence:   . Fear of Current or Ex-Partner:   .  Emotionally Abused:   [name] Physically Abused:   . Sexually Abused:     Family History: Family History  Problem Relation Age of Onset  . Diabetes Mother   . Heart disease Mother   . Hyperlipidemia Mother   . Hypertension Mother   . Heart disease Father   . Heart attack Father   . Hypertension Father   . Early death Brother 38  . Breast cancer Sister   . Lung cancer Sister   . Irritable bowel syndrome Sister   . Colon cancer Maternal Aunt   . Esophageal cancer Neg Hx   . Colon polyps Neg Hx     Review of Systems: All other systems reviewed and are otherwise negative except as noted above.   Physical Exam: VS:  There were no vitals taken for this visit. , BMI There is no height or weight on file to calculate BMI.  GEN- The patient is well appearing, alert and oriented x 3 today.   HEENT: normocephalic, atraumatic; sclera clear, conjunctiva pink; hearing intact; oropharynx clear; neck supple, no JVP Lymph- no cervical lymphadenopathy Lungs- Clear to ausculation bilaterally, normal work of breathing.  No wheezes, rales, rhonchi Heart- Regular rate and rhythm, no murmurs, rubs or gallops, PMI not laterally displaced GI- soft, non-tender, non-distended, bowel sounds present, no hepatosplenomegaly Extremities- no clubbing, cyanosis, or edema; DP/PT/radial pulses  2+ bilaterally MS- no significant deformity or atrophy Skin- warm and dry, no rash or lesion; ICD pocket well healed Psych- euthymic mood, full affect Neuro- strength and sensation are intact  ICD interrogation- reviewed in detail today,  See PACEART report  EKG:  EKG is not ordered today  Recent Labs: 08/16/2018: TSH 3.02 01/10/2019: ALT 14; BUN 49; Creatinine, Ser 1.62; Hemoglobin 13.6; Platelets 221; Potassium 4.2; Sodium 143   Wt Readings from Last 3 Encounters:  01/10/19 205 lb 9.6 oz (93.3 kg)  11/23/18 204 lb (92.5 kg)  08/16/18 204 lb 9.6 oz (92.8 kg)     Other studies Reviewed: Additional studies/ records that were reviewed today include: AHF notes   Assessment and Plan:  1.  Chronic systolic dysfunction euvolemic today Stable on an appropriate medical regimen Normal ICD function See Pace Art report No changes today  2.  VT No recent recurrence on low dose Amiodarone CBC, BMET, LFT's, TSH, FT4 today   3.  Paroxysmal atrial fibrillation Burden by device interrogation ***% Continue Eliquis for CHADS2VASC of 8    Current medicines are reviewed at length with the patient today.   The patient does not have concerns regarding her medicines.  The following changes were made today:  none  Labs/ tests ordered today include: CBC, BMET, LFTs, TSH, FT4 No orders of the defined types were placed in this encounter.    Disposition:   Follow up with Delilah Shan, AHF as scheduled, Dr Rayann Heman 1 year    Signed, Chanetta Marshall, NP 06/06/2019 11:51 AM  [place] [id number] [phone] (office) [phone] (fax)

## 2019-06-08 ENCOUNTER — Encounter: Payer: Medicare PPO | Admitting: Nurse Practitioner

## 2019-06-17 ENCOUNTER — Other Ambulatory Visit: Payer: Self-pay | Admitting: Internal Medicine

## 2019-06-25 NOTE — Progress Notes (Signed)
Cardiology Office Note Date:  06/27/2019  Patient ID:  [name], [name] 1945/11/10, MRN [id number] PCP:  Luciano Cutter, MD  Cardiologist:  Dr. Aundra Dubin EP: Dr. Rayann Heman     Chief Complaint:  device visit  History of Present Illness: [name] is a 74 y.o. female with history of CAD (s/p LAD and RCA PCI.  Last LHC in 11/12 with patent proximal LAD stent, ostial 70% D1 (jailed by stent), mild LAD stent patent, patent RCA stents), ICM, chronic CHF, VT, ICD, AFib, stroke, DM, HTN, HLD, CKD (III), gastroparesis, Hypothyroidism.  She comes in today to be seen for Dr. Rayann Heman, ast seen by him March 2020.  No symptoms, though worried about he son who was very ill at the time, no changes were made, planned for annual EP APP visit.  More recently saw Dr. Aundra Dubin via tele-health visit March 2021.  She noted some edema, weight was up some.  Her lasix was increased for a few days and planned to update her echo. She saw him today, had her echo and an EKG done with him.  (note and echo not yet available in Epic) She said she was doing OK, felt her swelling was from the veins in her legs, not her heart. Made some medication adjustments Added Samule Ohm, her crestor increased, to get labs done in 2 weeks at her PMD locally  She denies any CP, palpitations, no dizziness, near syncope or syncope.  NO shocks She mentions occasionally when her sinuses are bothering her she has some post nasal drip that occasionally has a bloody taste to it, but no over bloody nose, no bleeding otherwise.   Device information SJM dual chamber ICD, implanted 2003,  2007, gen change 2013 2003 had syncope >> EP stude with inducible PMVT + h/o PMVT 2012 with ICD shocks   AAD Hx 2007 amiodarone started quickly stopped 2/2 nausea Remotely as well tried on Sotalol and Multaq unclear when/why they were stopped 2012 restarted on amiodarone   Past Medical History:  Diagnosis Date  . Allergy   . Arthritis   . Atrial  fibrillation ([place])    controlled with amiodarone, on coumadin  . Chronic renal insufficiency   . Chronic systolic heart failure ([place])   . Congestive heart failure ([place])   . Coronary artery disease 01/31/2011  . Diabetes mellitus    type 1  . 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   . History of chicken pox   . Hyperlipidemia   . Hypertension   . Ischemic cardiomyopathy   . Lumbar spondylosis 01/11/2012  . Sleep apnea   . Stroke [facility]) 2010   eye doctor said she had TIA  . Ventricular tachycardia ([place])    Polymorphic    Past Surgical History:  Procedure Laterality Date  . ABDOMINAL HYSTERECTOMY  1995  . CARDIAC DEFIBRILLATOR PLACEMENT    . CHOLECYSTECTOMY  1985  . COLONOSCOPY WITH PROPOFOL N/A 02/24/2018   Procedure: COLONOSCOPY WITH PROPOFOL;  Surgeon: Milus Banister, MD;  Location: [facility] ENDOSCOPY;  Service: Endoscopy;  Laterality: N/A;  . ICD  2003/2007   implanted by Dr Rollene Fare, most recent generator change 2/13 by Dr Rayann Heman, Analyze ST study patient  . IMPLANTABLE CARDIOVERTER DEFIBRILLATOR (ICD) GENERATOR CHANGE N/A 05/05/2011   Procedure: ICD GENERATOR CHANGE;  Surgeon: Thompson Grayer, MD;  Location: [facility] CATH LAB;  Service: Cardiovascular;  Laterality: N/A;  . LEFT HEART CATHETERIZATION WITH CORONARY ANGIOGRAM N/A 01/30/2011   Procedure: LEFT HEART CATHETERIZATION WITH CORONARY ANGIOGRAM;  Surgeon: Larey Dresser, MD;  Location: [facility] CATH LAB;  Service: Cardiovascular;  Laterality: N/A;  . POLYPECTOMY  02/24/2018   Procedure: POLYPECTOMY;  Surgeon: Milus Banister, MD;  Location: [facility] ENDOSCOPY;  Service: Endoscopy;;  . Leta [facility]  1980    Current Outpatient Medications  Medication Sig Dispense Refill  . acetaminophen (TYLENOL) 500 MG tablet Take 500 mg by mouth daily as needed for moderate pain or headache.    [name] amiodarone (PACERONE) 100 MG tablet Take 1 tablet (100 mg total) by mouth daily. 90 tablet 0  . Blood Glucose Monitoring Suppl (ACCU-CHEK NANO SMARTVIEW)  W/DEVICE KIT Use to test blood sugar 4 times daily as instructed. Dx code: E11.59 1 kit 0  . carvedilol (COREG) 25 MG tablet Take 1 tablet (25 mg total) by mouth 2 (two) times daily with a meal. Please cancel all previous orders for current medication. Change in dosage or pill size. 60 tablet 6  . cetirizine (ZYRTEC) 10 MG tablet Take by mouth.    . Cholecalciferol (VITAMIN D3) 2000 UNITS TABS Take 2,000 Units by mouth daily.     . dapagliflozin propanediol (FARXIGA) 10 MG TABS tablet Take 10 mg by mouth daily before breakfast. 30 tablet 5  . dicyclomine (BENTYL) 10 MG capsule TAKE 1 CAPSULE (10 MG TOTAL) BY MOUTH 4 (FOUR) TIMES DAILY BEFORE MEALS AND AT BEDTIME. 120 capsule 1  . ELIQUIS 5 MG TABS tablet TAKE 1 TABLET BY MOUTH TWICE A DAY 60 tablet 6  . fluticasone (FLONASE) 50 MCG/ACT nasal spray Place into the nose.    . furosemide (LASIX) 20 MG tablet Take '40mg'$  (2 tabs) daily for 3 days, THEN '20mg'$  (1 tab) daily after that 90 tablet 3  . gabapentin (NEURONTIN) 400 MG capsule TAKE 1 CAPSULE (400 MG TOTAL) BY MOUTH 2 (TWO) TIMES DAILY. 180 capsule 1  . glucose blood (ACCU-CHEK SMARTVIEW) test strip Use to test blood sugar 4 times daily as instructed. Dx code E11.59 200 each 11  . LEVEMIR FLEXTOUCH 100 UNIT/ML Pen INJECT 40 UNITS INTO THE SKIN DAILY AT 10 PM. (Patient taking differently: Inject 35 Units into the skin at bedtime. ) 15 mL 1  . levothyroxine (SYNTHROID) 50 MCG tablet     . magnesium oxide (MAG-OX) 400 MG tablet TAKE 1 TABLET BY MOUTH TWICE A DAY 180 tablet 0  . metFORMIN (GLUCOPHAGE-XR) 500 MG 24 hr tablet Take 3 tablets (1,500 mg total) by mouth daily with supper. 270 tablet 3  . Multiple Vitamins-Minerals (EYE VITAMINS PO) Take 1 tablet by mouth 2 (two) times daily.     . nitroGLYCERIN (NITROSTAT) 0.4 MG SL tablet Place 1 tablet (0.4 mg total) under the tongue every 5 (five) minutes as needed for chest pain. 25 tablet 1  . NOVOLOG 100 UNIT/ML injection INJECT 10-14 UNITS INTO THE SKIN  3 (THREE) TIMES DAILY BEFORE MEALS. 10 mL 2  . omeprazole (PRILOSEC) 20 MG capsule TAKE 1 CAPSULE BY MOUTH EVERY DAY 90 capsule 1  . rosuvastatin (CRESTOR) 40 MG tablet Take 1 tablet (40 mg total) by mouth daily. 30 tablet 5  . sacubitril-valsartan (ENTRESTO) 97-103 MG Take 1 tablet by mouth 2 (two) times daily. 60 tablet 11  . traMADol (ULTRAM) 50 MG tablet TAKE 1 TABLET BY MOUTH EVERY 6 HOURS AS NEEDED FOR PAIN 120 tablet 2   No current facility-administered medications for this visit.    Allergies:   Veltassa [patiromer], Darvon, Promethazine hcl, Spironolactone, and Plavix [clopidogrel bisulfate]   Social History:  The patient  reports that she quit smoking about 23 years ago. She has never used smokeless tobacco. She reports that she does not drink alcohol or use drugs.   Family History:  The patient's family history includes Breast cancer in her sister; Colon cancer in her maternal aunt; Diabetes in her mother; Early death (age of onset: 72) in her brother; Heart attack in her father; Heart disease in her father and mother; Hyperlipidemia in her mother; Hypertension in her father and mother; Irritable bowel syndrome in her sister; Lung cancer in her sister.  ROS:  Please see the history of present illness. All other systems are reviewed and otherwise negative.   PHYSICAL EXAM:  VS:  BP 126/60   Pulse 65   Ht '5\' 3"'$  (1.6 m)   Wt 210 lb (95.3 kg)   SpO2 96%   BMI 37.20 kg/m  BMI: Body mass index is 37.2 kg/m. Well nourished, well developed, in no acute distress  HEENT: normocephalic, atraumatic  Neck: no JVD, carotid bruits or masses Cardiac:  RRR; no significant murmurs, no rubs, or gallops Lungs:  CTA b/l, no wheezing, rhonchi or rales  Abd: soft, nontender MS: no deformity or atrophy Ext: 1++ edema L>R Skin: warm and dry, no rash Neuro:  No gross deficits appreciated Psych: euthymic mood, full affect  ICD site is stable, no tethering or discomfort   EKG: done at the HF  clinis is SR, LAD, poor ant R progression, T changes unchanged from prior  ICD interrogation done today and reviewed by myself;  Battery estimate to ERI is 75moLead measurements are good AMS noted, burden <1%   08/16/2018: TTE IMPRESSIONS  1. The left ventricle has a visually estimated ejection fraction of 40%.  The cavity size was normal. There is moderately increased left ventricular  wall thickness. Left ventricular diastolic Doppler parameters are  consistent with impaired relaxation.  Inferior and inferoseptal severe hypokinesis.  2. The right ventricle has normal systolic function. The cavity was  normal. There is no increase in right ventricular wall thickness.  3. Left atrial size was mildly dilated.  4. There is mild to moderate mitral annular calcification present. No  evidence of mitral valve stenosis. No significant regurgitation.  5. The aortic valve is tricuspid. Moderate calcification of the aortic  valve. Aortic valve regurgitation is mild by color flow Doppler. No  stenosis of the aortic valve.  6. The aortic root is normal in size and structure.  7. Normal IVC. No complete TR doppler jet so unable to estimate PA  systolic pressure.    Recent Labs: 08/16/2018: TSH 3.02 01/10/2019: ALT 14; BUN 49; Creatinine, Ser 1.62; Hemoglobin 13.6; Platelets 221; Potassium 4.2; Sodium 143  08/16/2018: Cholesterol 147; HDL 49.80; LDL Cholesterol 81; Total CHOL/HDL Ratio 3; Triglycerides 85.0; VLDL 17.0   CrCl cannot be calculated (Patient's most recent lab result is older than the maximum 21 days allowed.).   Wt Readings from Last 3 Encounters:  06/27/19 210 lb (95.3 kg)  06/27/19 210 lb 12.8 oz (95.6 kg)  01/10/19 205 lb 9.6 oz (93.3 kg)     Other studies reviewed: Additional studies/records reviewed today include: summarized above  ASSESSMENT AND PLAN:  1. ICD     Battery estimate to ERI is 969mo   No programming changes made     Discussed need for gen change  likely within the year, she is familiar with vibratory alert     Discussed the procedure today and given  will be her 4th device increased infection risk  2. ICM 3. Chronic CHF (systolic)     Has had some subsequent improvement in LVEF     Echo today, pending result     Follows closely with Dr. Aundra Dubin and the HF team  4. CAD      No symptoms of angina      Follows with Dr. Aundra Dubin, crestor increased today  5. Paroxysmal Afib     CHA2DS2Vasc is 9, on Eliquis, appropriately dosed for age/weight     <1%  6. HTN     Looks good  7. HLD     As above  8. Hx of PMVT     On low dose amiodarone     Labs monitored via her PMD    Disposition: F/u with monthly battery checks, see her back in 36mo sooner if needed. She should be close to needing gen change then    Current medicines are reviewed at length with the patient today.  The patient did not have any concerns regarding medicines.  SVenetia Night PA-C 06/27/2019 2:40 PM     [place] [phone](office)  [phone](fax)

## 2019-06-26 ENCOUNTER — Other Ambulatory Visit (HOSPITAL_COMMUNITY): Payer: Self-pay

## 2019-06-26 DIAGNOSIS — I5022 Chronic systolic (congestive) heart failure: Secondary | ICD-10-CM

## 2019-06-26 NOTE — Progress Notes (Signed)
echo

## 2019-06-27 ENCOUNTER — Other Ambulatory Visit: Payer: Self-pay

## 2019-06-27 ENCOUNTER — Ambulatory Visit: Payer: Medicare PPO | Admitting: Physician Assistant

## 2019-06-27 ENCOUNTER — Ambulatory Visit (HOSPITAL_BASED_OUTPATIENT_CLINIC_OR_DEPARTMENT_OTHER)
Admission: RE | Admit: 2019-06-27 | Discharge: 2019-06-27 | Disposition: A | Discharge status: Home / Self Care | Payer: Medicare PPO | Source: Ambulatory Visit | Attending: Cardiology | Admitting: Cardiology

## 2019-06-27 ENCOUNTER — Encounter (HOSPITAL_COMMUNITY): Payer: Self-pay | Admitting: Cardiology

## 2019-06-27 ENCOUNTER — Ambulatory Visit (HOSPITAL_COMMUNITY)
Admission: RE | Admit: 2019-06-27 | Discharge: 2019-06-27 | Disposition: A | Discharge status: Home / Self Care | Payer: Medicare PPO | Source: Ambulatory Visit | Attending: Cardiology | Admitting: Cardiology

## 2019-06-27 VITALS — BP 126/70 | HR 67 | Wt 210.8 lb

## 2019-06-27 VITALS — BP 126/60 | HR 65 | Ht 63.0 in | Wt 210.0 lb

## 2019-06-27 DIAGNOSIS — I480 Paroxysmal atrial fibrillation: Secondary | ICD-10-CM

## 2019-06-27 DIAGNOSIS — I130 Hypertensive heart and chronic kidney disease with heart failure and stage 1 through stage 4 chronic kidney disease, or unspecified chronic kidney disease: Secondary | ICD-10-CM | POA: Insufficient documentation

## 2019-06-27 DIAGNOSIS — I5022 Chronic systolic (congestive) heart failure: Secondary | ICD-10-CM

## 2019-06-27 DIAGNOSIS — E785 Hyperlipidemia, unspecified: Secondary | ICD-10-CM | POA: Diagnosis not present

## 2019-06-27 DIAGNOSIS — Z7901 Long term (current) use of anticoagulants: Secondary | ICD-10-CM | POA: Insufficient documentation

## 2019-06-27 DIAGNOSIS — I255 Ischemic cardiomyopathy: Secondary | ICD-10-CM | POA: Insufficient documentation

## 2019-06-27 DIAGNOSIS — Z79899 Other long term (current) drug therapy: Secondary | ICD-10-CM | POA: Insufficient documentation

## 2019-06-27 DIAGNOSIS — I2510 Atherosclerotic heart disease of native coronary artery without angina pectoris: Secondary | ICD-10-CM | POA: Insufficient documentation

## 2019-06-27 DIAGNOSIS — Z95810 Presence of automatic (implantable) cardiac defibrillator: Secondary | ICD-10-CM | POA: Insufficient documentation

## 2019-06-27 DIAGNOSIS — E039 Hypothyroidism, unspecified: Secondary | ICD-10-CM | POA: Insufficient documentation

## 2019-06-27 DIAGNOSIS — Z794 Long term (current) use of insulin: Secondary | ICD-10-CM | POA: Diagnosis not present

## 2019-06-27 DIAGNOSIS — Z8673 Personal history of transient ischemic attack (TIA), and cerebral infarction without residual deficits: Secondary | ICD-10-CM | POA: Diagnosis not present

## 2019-06-27 DIAGNOSIS — I472 Ventricular tachycardia, unspecified: Secondary | ICD-10-CM

## 2019-06-27 DIAGNOSIS — M25569 Pain in unspecified knee: Secondary | ICD-10-CM | POA: Diagnosis not present

## 2019-06-27 DIAGNOSIS — N183 Chronic kidney disease, stage 3 unspecified: Secondary | ICD-10-CM | POA: Diagnosis not present

## 2019-06-27 DIAGNOSIS — Z955 Presence of coronary angioplasty implant and graft: Secondary | ICD-10-CM | POA: Insufficient documentation

## 2019-06-27 DIAGNOSIS — E1122 Type 2 diabetes mellitus with diabetic chronic kidney disease: Secondary | ICD-10-CM | POA: Insufficient documentation

## 2019-06-27 DIAGNOSIS — K3184 Gastroparesis: Secondary | ICD-10-CM | POA: Insufficient documentation

## 2019-06-27 DIAGNOSIS — R0602 Shortness of breath: Secondary | ICD-10-CM | POA: Diagnosis present

## 2019-06-27 DIAGNOSIS — Z87891 Personal history of nicotine dependence: Secondary | ICD-10-CM | POA: Insufficient documentation

## 2019-06-27 DIAGNOSIS — E7849 Other hyperlipidemia: Secondary | ICD-10-CM | POA: Diagnosis not present

## 2019-06-27 MED ORDER — ROSUVASTATIN CALCIUM 40 MG PO TABS: 40.0000 mg | ORAL_TABLET | Freq: Every day | ORAL | 5 refills | Status: DC

## 2019-06-27 MED ORDER — DAPAGLIFLOZIN PROPANEDIOL 10 MG PO TABS: 10.0000 mg | ORAL_TABLET | Freq: Every day | ORAL | 5 refills | Status: DC

## 2019-06-27 NOTE — Patient Instructions (Signed)
Medication Instructions:  Your physician recommends that you continue on your current medications as directed. Please refer to the Current Medication list given to you today.  *If you need a refill on your cardiac medications before your next appointment, please call your pharmacy*   Lab Work: [place]   If you have labs (blood work) drawn today and your tests are completely normal, you will receive your results only by: [name] MyChart Message (if you have MyChart) OR . A paper copy in the mail If you have any lab test that is abnormal or we need to change your treatment, we will call you to review the results.   Testing/Procedures: NONE ORDERED  TODAY   Follow-Up: At [facility], you and your health needs are our priority.  As part of our continuing mission to provide you with exceptional heart care, we have created designated Provider Care Teams.  These Care Teams include your primary Cardiologist (physician) and Advanced Practice Providers (APPs -  Physician Assistants and Nurse Practitioners) who all work together to provide you with the care you need, when you need it.  We recommend signing up for the patient portal called "MyChart".  Sign up information is provided on this After Visit Summary.  MyChart is used to connect with patients for Virtual Visits (Telemedicine).  Patients are able to view lab/test results, encounter notes, upcoming appointments, etc.  Non-urgent messages can be sent to your provider as well.   To learn more about what you can do with MyChart, go to [email or web address].    Your next appointment:   9 month(s)  The format for your next appointment:   In Person  Provider:   You may see Dr. Rayann Heman  or one of the following Advanced Practice Providers on your designated Care Team:    Chanetta Marshall, NP  Tommye Standard, PA-C  Legrand Como "Oda Kilts, [place]    Other Instructions

## 2019-06-27 NOTE — Patient Instructions (Addendum)
START Farxiga 10mg  (1 tab) daily  INCREASE Crestor to 40mg  (1 tab) daily  Labs in 2 weeks  (you were given a script to get this done locally) We will only contact you if something comes back abnormal or we need to make some changes. Otherwise no news is good news!  2 month Lab visit: Monday, June 7th, 2021 at 11:30am [place] Parking [id number]  Your physician recommends that you schedule a follow-up appointment in: 3 months with Dr Aundra Dubin  Next office visit: July 9th, 2021 at 12pm July parking code: [id number]   Please call office at [phone] option 2 if you have any questions or concerns.   At the [facility], you and your health needs are our priority. As part of our continuing mission to provide you with exceptional heart care, we have created designated Provider Care Teams. These Care Teams include your primary Cardiologist (physician) and Advanced Practice Providers (APPs- Physician Assistants and Nurse Practitioners) who all work together to provide you with the care you need, when you need it.   You may see any of the following providers on your designated Care Team at your next follow up: [name] Dr Glori Bickers . Dr Loralie Champagne . Darrick Grinder, NP . Lyda Jester, PA . Audry Riles, PharmD   Please be sure to bring in all your medications bottles to every appointment.

## 2019-06-27 NOTE — Progress Notes (Signed)
  Echocardiogram 2D Echocardiogram has been performed.  Lilian Fuhs A Zabrina Brotherton 06/27/2019, 12:10 PM

## 2019-06-28 NOTE — Progress Notes (Signed)
PCP:  Biagio Borg, MD  Cardiologist:  Loralie Champagne, MD   History of Present Illness: [name] is a 74 y.o. female who has a history of CAD, ischemic cardiomyopathy, and atrial fibrillation.  Patient was hospitalized in 11/12 at [facility] for VT with ICD discharge.  She had a left heart cath showing patent stents.  EF was 35-40% by echo.  She is on amiodarone.  She has had periodic problems with creatinine rising with medication adjustments.  Echo in 12/18 showed EF 35-40%, inferior and septal hypokinesis, mild MR.    Echo in 5/20 showed EF 40%, normal RV, mild AI.   She did not tolerate eplerenone due to nausea.  Echo was done today and reviewed, EF 40-45% with inferior/inferoseptal HK, normal RV.   She is currently living with her daughter in [place]. She is short of breath after walking 200-300 feet, this is chronic.  She is also limited by knee pain.  She has slept on 2 pillows chronically.  She continues to have some peripheral edema.  No palpitations.  No chest pain.    ECG (personally reviewed): NSR, LVH  Labs (10/12): LDL 73, HDL 44 Labs (11/12): K 3.9, creatinine 1.1, LFTs normal, TSH normal Labs (12/12): K 3.9, creatinine 1.5, proBNP 18 Labs (1/13): LDL 82, HDL 57 Labs (2/13): K 4.3, creatinine 1.5 Labs (5/13): creatinine 2.4 => 1.7, LFTs normal, TSH normal, proBNP 18 Labs (6/13): K 4.2, creatinine 1.7 Labs (10/13): K 4.3, creatinine 1.4 Labs (4/14); LFTs normal, TSH normal, LDL 71, HDL 58 Labs (5/14): K 4.5, creatinine 1.4 Labs (8/14): TSH normal, LFTs normal Labs (9/14): K 4.2, creatinine 1.8, LDL 75, HDL 54 Labs (07/31/13) K 3.7 Creatinine 1.8 BNP 148  Labs (9/15): K 4.3, creatinine 1.24, LFTs normal, LDL 81, HDL 58, TSH normal Labs (9/16): K 4.4, creatinine 1.44, LDL 64, HDL 54, LFTs normal, TSH normal, HCT 38 Labs (2/17): K 4.9, creatinine 1.76, HCT 38.6 Labs (1/19): LDL 71, HDL 58, K 4.8, creatinine 1.26, LFTs normal, hgb 12.9.  Labs (2/19): Creatinine  1.35, K 4.9 Labs (6/19): K 4.4, creatinine 1.57 Labs (8/19): K 5, creatinine 1.59, LFTs normal Labs (2/20): K 4.4, creatinine 1.4, LDL 72, HDL 55, normal LFTs Labs (5/20): LDL 81, K 4.8, creatinine 1.69 Labs (10/20): hgb 13.6, K 4.2, creatinine 1.62, LFTs normal, LDL 81, HDL 50 Labs (12/20): LDL 88 Labs (3/21): K 4, creatinine 1.56  PMH: 1. Diabetes mellitus 2. CVA, TIA in 2010 3. HTN 4. CKD stage 3 5. H/o TAH 6. H/o CCY 7. Sciatica 8. Atrial fibrillation 9. CAD: s/p LAD and RCA PCI.  Last LHC in 11/12 with patent proximal LAD stent, ostial 70% D1 (jailed by stent), mild LAD stent patent, patent RCA stents, EF 40% with global hypokinesis.  10. Ischemic cardiomyopathy: Echo (10/12): EF 35-40%, moderate focal basal septal hypertrophy, inferior akinesis, grade I diastolic dysfunction, moderate aortic insufficiency. St Jude dual chamber ICD.  Spironolactone stopped when creatinine rose to 2.5.  Echo (5/14) with EF 40-45%, mild AI.  Echo (6/16) with EF 40-45%, inferior/inferoseptal hypokinesis, mild LVH.  - Echo (12/18): EF 35-40%, inferior and septal hypokinesis, mild MR, mild AI.  - Echo (5/20): EF 40%, inferior and inferoseptal hypokinesis, normal RV size and systolic function, mild AI, normal IVC.  - Unable to tolerate eplerenone.  - Echo (4/21): EF 40-45%, inferior/inferoseptal HK, normal RV, mild AI.  11. Aortic insufficiency: moderate in past but mild on most recent echo.   12. History  of VT: on amiodarone.  13. Gastroparesis 14. Hypothyroidism  SH: Divorced.  3 children.  Quit smoking in 1997. Lives in [place] now with daughter.  [place].   FH: CAD  ROS: All systems reviewed and negative except as per HPI.   Current Outpatient Medications  Medication Sig Dispense Refill  . acetaminophen (TYLENOL) 500 MG tablet Take 500 mg by mouth daily as needed for moderate pain or headache.    [name] amiodarone (PACERONE) 100 MG tablet Take 1 tablet (100 mg total) by mouth daily. 90  tablet 0  . Blood Glucose Monitoring Suppl (ACCU-CHEK NANO SMARTVIEW) W/DEVICE KIT Use to test blood sugar 4 times daily as instructed. Dx code: E11.59 1 kit 0  . carvedilol (COREG) 25 MG tablet Take 1 tablet (25 mg total) by mouth 2 (two) times daily with a meal. Please cancel all previous orders for current medication. Change in dosage or pill size. 60 tablet 6  . cetirizine (ZYRTEC) 10 MG tablet Take by mouth.    . Cholecalciferol (VITAMIN D3) 2000 UNITS TABS Take 2,000 Units by mouth daily.     [name] dicyclomine (BENTYL) 10 MG capsule TAKE 1 CAPSULE (10 MG TOTAL) BY MOUTH 4 (FOUR) TIMES DAILY BEFORE MEALS AND AT BEDTIME. 120 capsule 1  . ELIQUIS 5 MG TABS tablet TAKE 1 TABLET BY MOUTH TWICE A DAY 60 tablet 6  . fluticasone (FLONASE) 50 MCG/ACT nasal spray Place into the nose.    . furosemide (LASIX) 20 MG tablet Take '40mg'$  (2 tabs) daily for 3 days, THEN '20mg'$  (1 tab) daily after that 90 tablet 3  . gabapentin (NEURONTIN) 400 MG capsule TAKE 1 CAPSULE (400 MG TOTAL) BY MOUTH 2 (TWO) TIMES DAILY. 180 capsule 1  . glucose blood (ACCU-CHEK SMARTVIEW) test strip Use to test blood sugar 4 times daily as instructed. Dx code E11.59 200 each 11  . LEVEMIR FLEXTOUCH 100 UNIT/ML Pen INJECT 40 UNITS INTO THE SKIN DAILY AT 10 PM. (Patient taking differently: Inject 35 Units into the skin at bedtime. ) 15 mL 1  . levothyroxine (SYNTHROID) 50 MCG tablet     . magnesium oxide (MAG-OX) 400 MG tablet TAKE 1 TABLET BY MOUTH TWICE A DAY 180 tablet 0  . metFORMIN (GLUCOPHAGE-XR) 500 MG 24 hr tablet Take 3 tablets (1,500 mg total) by mouth daily with supper. 270 tablet 3  . Multiple Vitamins-Minerals (EYE VITAMINS PO) Take 1 tablet by mouth 2 (two) times daily.     . nitroGLYCERIN (NITROSTAT) 0.4 MG SL tablet Place 1 tablet (0.4 mg total) under the tongue every 5 (five) minutes as needed for chest pain. 25 tablet 1  . NOVOLOG 100 UNIT/ML injection INJECT 10-14 UNITS INTO THE SKIN 3 (THREE) TIMES DAILY BEFORE MEALS. 10 mL  2  . omeprazole (PRILOSEC) 20 MG capsule TAKE 1 CAPSULE BY MOUTH EVERY DAY 90 capsule 1  . rosuvastatin (CRESTOR) 40 MG tablet Take 1 tablet (40 mg total) by mouth daily. 30 tablet 5  . sacubitril-valsartan (ENTRESTO) 97-103 MG Take 1 tablet by mouth 2 (two) times daily. 60 tablet 11  . traMADol (ULTRAM) 50 MG tablet TAKE 1 TABLET BY MOUTH EVERY 6 HOURS AS NEEDED FOR PAIN 120 tablet 2  . dapagliflozin propanediol (FARXIGA) 10 MG TABS tablet Take 10 mg by mouth daily before breakfast. 30 tablet 5   No current facility-administered medications for this encounter.   BP 126/70   Pulse 67   Wt 95.6 kg (210 lb 12.8 oz)   SpO2  96%   BMI 37.34 kg/m  General: NAD Neck: No JVD, no thyromegaly or thyroid nodule.  Lungs: Clear to auscultation bilaterally with normal respiratory effort. CV: Nondisplaced PMI.  Heart regular S1/S2, no S3/S4, no murmur.  1+ edema 1/2 to knees bilaterally.  No carotid bruit.  Normal pedal pulses.  Abdomen: Soft, nontender, no hepatosplenomegaly, no distention.  Skin: Intact without lesions or rashes.  Neurologic: Alert and oriented x 3.  Psych: Normal affect. Extremities: No clubbing or cyanosis.  HEENT: Normal.   Assessment/Plan 1. Atrial fibrillation: Paroxysmal.  NSR today. - Continue amiodarone 100 mg daily.  She will get LFTs with lipid check in 2 months. She is on Levoxyl followed by PCP. She will need regular eye exams.  - Continue Eliquis 5 mg bid.  2. Coronary artery disease: No chest pain.  Nonobstructive disease on last cath.  - Continue statin.  - No ASA given Eliquis use.   3. Ventricular tachycardia - On amiodarone 100 mg daily with suppression of VT. 4. Chronic systolic CHF: Ischemic cardiomyopathy.  EF 40-45% on today's echo. She has a [employer], political party ICD. NYHA class II-III symptoms, stable.  She has peripheral edema but no JVD.  I suspect venous insufficiency, do not think that she has significant CHF.  - Continue Entresto 97/103 mg BID  - Continue Coreg  25 mg bid.  - Continue Lasix 20 mg daily.   - She has tolerated neither spironolactone nor eplerenone.  - Start dapagliflozin 10 mg daily, BMET in 2 wks.  This will likely give her a little additional diuresis.  5. CKD stage 3: BMET in 2 wks.  6. Hypothyroidism: Managed by PCP.  7. Suspected venous insufficiency: Wear compression stockings during the day.  8. Hyperlipidemia: Goal LDL < 70.  Increase Crestor to 40 mg daily with lipids/LFTs in 2 months.   Recommended follow-up:  3 months  Signed, Loralie Champagne, MD  06/28/2019  [place] [address] [facility] and [place] [address] [phone] (office) [phone] (fax)

## 2019-07-11 ENCOUNTER — Other Ambulatory Visit (HOSPITAL_COMMUNITY): Payer: Self-pay | Admitting: Cardiology

## 2019-07-16 ENCOUNTER — Other Ambulatory Visit: Payer: Self-pay | Admitting: Internal Medicine

## 2019-07-28 ENCOUNTER — Ambulatory Visit: Payer: Medicare PPO

## 2019-07-28 ENCOUNTER — Telehealth: Payer: Self-pay

## 2019-07-28 NOTE — Telephone Encounter (Signed)
Left message for patient to remind of missed remote transmission.  

## 2019-07-31 ENCOUNTER — Telehealth: Payer: Self-pay

## 2019-07-31 NOTE — Telephone Encounter (Signed)
Patient called back and that transmission was sent successfully.

## 2019-07-31 NOTE — Telephone Encounter (Signed)
Patient called needing help sending a transmission. We tried multiple times to unplug it and plug it back in, nothing worked. Patient will call tech support and give [name] a call back

## 2019-08-01 LAB — CUP PACEART REMOTE DEVICE CHECK
Battery Remaining Longevity: 9 mo
Battery Remaining Percentage: 9 %
Battery Voltage: 2.66 V
Brady Statistic AP VP Percent: 3.9 %
Brady Statistic AP VS Percent: 87 %
Brady Statistic AS VP Percent: 1 %
Brady Statistic AS VS Percent: 7.8 %
Brady Statistic RA Percent Paced: 89 %
Brady Statistic RV Percent Paced: 4.8 %
Date Time Interrogation Session: 20210510101540
HighPow Impedance: 49 Ohm
Implantable Lead Implant Date: 20070117
Implantable Lead Implant Date: 20070117
Implantable Lead Location: 753859
Implantable Lead Location: 753860
Implantable Lead Model: 7040
Implantable Pulse Generator Implant Date: 20130212
Lead Channel Impedance Value: 490 Ohm
Lead Channel Impedance Value: 660 Ohm
Lead Channel Pacing Threshold Amplitude: 0.5 V
Lead Channel Pacing Threshold Amplitude: 1 V
Lead Channel Pacing Threshold Pulse Width: 0.5 ms
Lead Channel Pacing Threshold Pulse Width: 0.5 ms
Lead Channel Sensing Intrinsic Amplitude: 11.8 mV
Lead Channel Sensing Intrinsic Amplitude: 4.7 mV
Lead Channel Setting Pacing Amplitude: 2 V
Lead Channel Setting Pacing Amplitude: 2.5 V
Lead Channel Setting Pacing Pulse Width: 0.5 ms
Lead Channel Setting Sensing Sensitivity: 0.5 mV
Pulse Gen Serial Number: 819806

## 2019-08-02 ENCOUNTER — Ambulatory Visit: Payer: Medicare PPO | Admitting: Internal Medicine

## 2019-08-07 ENCOUNTER — Other Ambulatory Visit: Payer: Self-pay | Admitting: Internal Medicine

## 2019-08-11 ENCOUNTER — Other Ambulatory Visit: Payer: Self-pay | Admitting: Internal Medicine

## 2019-08-11 NOTE — Telephone Encounter (Signed)
Please refill as per office routine med refill policy (all routine meds refilled for 3 mo or monthly per pt preference up to one year from last visit, then month to month grace period for 3 mo, then further med refills will have to be denied)  

## 2019-08-12 ENCOUNTER — Other Ambulatory Visit: Payer: Self-pay | Admitting: Internal Medicine

## 2019-08-14 ENCOUNTER — Other Ambulatory Visit: Payer: Self-pay | Admitting: Internal Medicine

## 2019-08-28 ENCOUNTER — Other Ambulatory Visit (HOSPITAL_COMMUNITY): Payer: Self-pay | Admitting: Cardiology

## 2019-08-28 ENCOUNTER — Other Ambulatory Visit (HOSPITAL_COMMUNITY): Payer: Medicare PPO

## 2019-08-28 ENCOUNTER — Ambulatory Visit (INDEPENDENT_AMBULATORY_CARE_PROVIDER_SITE_OTHER): Payer: Medicare PPO | Admitting: *Deleted

## 2019-08-28 DIAGNOSIS — I472 Ventricular tachycardia: Secondary | ICD-10-CM

## 2019-08-28 DIAGNOSIS — I4729 Other ventricular tachycardia: Secondary | ICD-10-CM

## 2019-08-29 ENCOUNTER — Telehealth: Payer: Self-pay

## 2019-08-29 NOTE — Telephone Encounter (Signed)
Left message for patient to remind of missed remote transmission.  

## 2019-08-29 NOTE — Telephone Encounter (Signed)
The pt states she tried to transmit and called tech support. Someone was supposed to call her back but did not. I told her if they did not call her today to call back tomorrow. The pt verbalized understanding.

## 2019-08-30 ENCOUNTER — Other Ambulatory Visit: Payer: Self-pay | Admitting: Internal Medicine

## 2019-08-31 ENCOUNTER — Other Ambulatory Visit (HOSPITAL_COMMUNITY): Payer: Self-pay | Admitting: Cardiology

## 2019-08-31 ENCOUNTER — Other Ambulatory Visit: Payer: Self-pay | Admitting: Internal Medicine

## 2019-08-31 LAB — CUP PACEART REMOTE DEVICE CHECK
Battery Remaining Longevity: 8 mo
Battery Remaining Percentage: 7 %
Battery Voltage: 2.65 V
Brady Statistic AP VP Percent: 10 %
Brady Statistic AP VS Percent: 75 %
Brady Statistic AS VP Percent: 1.8 %
Brady Statistic AS VS Percent: 12 %
Brady Statistic RA Percent Paced: 81 %
Brady Statistic RV Percent Paced: 12 %
Date Time Interrogation Session: 20210609134339
HighPow Impedance: 45 Ohm
Implantable Lead Implant Date: 20070117
Implantable Lead Implant Date: 20070117
Implantable Lead Location: 753859
Implantable Lead Location: 753860
Implantable Lead Model: 7040
Implantable Pulse Generator Implant Date: 20130212
Lead Channel Impedance Value: 400 Ohm
Lead Channel Impedance Value: 540 Ohm
Lead Channel Pacing Threshold Amplitude: 0.5 V
Lead Channel Pacing Threshold Amplitude: 1 V
Lead Channel Pacing Threshold Pulse Width: 0.5 ms
Lead Channel Pacing Threshold Pulse Width: 0.5 ms
Lead Channel Sensing Intrinsic Amplitude: 11.8 mV
Lead Channel Sensing Intrinsic Amplitude: 4.8 mV
Lead Channel Setting Pacing Amplitude: 2 V
Lead Channel Setting Pacing Amplitude: 2.5 V
Lead Channel Setting Pacing Pulse Width: 0.5 ms
Lead Channel Setting Sensing Sensitivity: 0.5 mV
Pulse Gen Serial Number: 819806

## 2019-08-31 NOTE — Progress Notes (Signed)
Remote ICD transmission.   

## 2019-09-23 ENCOUNTER — Other Ambulatory Visit: Payer: Self-pay | Admitting: Internal Medicine

## 2019-09-23 NOTE — Telephone Encounter (Signed)
Please refill as per office routine med refill policy (all routine meds refilled for 3 mo or monthly per pt preference up to one year from last visit, then month to month grace period for 3 mo, then further med refills will have to be denied)  

## 2019-09-25 ENCOUNTER — Other Ambulatory Visit: Payer: Self-pay | Admitting: Internal Medicine

## 2019-09-27 NOTE — Telephone Encounter (Signed)
Pt see Dr. Letta Median for diabetes. Forwarding request to endo.Marland KitchenJohny Chess

## 2019-09-28 ENCOUNTER — Other Ambulatory Visit: Payer: Self-pay | Admitting: Internal Medicine

## 2019-09-29 ENCOUNTER — Other Ambulatory Visit: Payer: Self-pay

## 2019-09-29 ENCOUNTER — Encounter (HOSPITAL_COMMUNITY): Payer: Self-pay | Admitting: Cardiology

## 2019-09-29 ENCOUNTER — Ambulatory Visit (HOSPITAL_COMMUNITY)
Admission: RE | Admit: 2019-09-29 | Discharge: 2019-09-29 | Disposition: A | Discharge status: Home / Self Care | Payer: Medicare PPO | Source: Ambulatory Visit | Attending: Cardiology | Admitting: Cardiology

## 2019-09-29 VITALS — BP 98/62 | HR 64 | Wt 202.0 lb

## 2019-09-29 DIAGNOSIS — Z955 Presence of coronary angioplasty implant and graft: Secondary | ICD-10-CM | POA: Insufficient documentation

## 2019-09-29 DIAGNOSIS — I5022 Chronic systolic (congestive) heart failure: Secondary | ICD-10-CM | POA: Diagnosis not present

## 2019-09-29 DIAGNOSIS — N183 Chronic kidney disease, stage 3 unspecified: Secondary | ICD-10-CM | POA: Diagnosis not present

## 2019-09-29 DIAGNOSIS — Z95810 Presence of automatic (implantable) cardiac defibrillator: Secondary | ICD-10-CM | POA: Diagnosis not present

## 2019-09-29 DIAGNOSIS — Z7901 Long term (current) use of anticoagulants: Secondary | ICD-10-CM | POA: Diagnosis not present

## 2019-09-29 DIAGNOSIS — Z79890 Hormone replacement therapy (postmenopausal): Secondary | ICD-10-CM | POA: Insufficient documentation

## 2019-09-29 DIAGNOSIS — Z8673 Personal history of transient ischemic attack (TIA), and cerebral infarction without residual deficits: Secondary | ICD-10-CM | POA: Insufficient documentation

## 2019-09-29 DIAGNOSIS — I480 Paroxysmal atrial fibrillation: Secondary | ICD-10-CM

## 2019-09-29 DIAGNOSIS — M5430 Sciatica, unspecified side: Secondary | ICD-10-CM | POA: Diagnosis not present

## 2019-09-29 DIAGNOSIS — E785 Hyperlipidemia, unspecified: Secondary | ICD-10-CM | POA: Insufficient documentation

## 2019-09-29 DIAGNOSIS — I130 Hypertensive heart and chronic kidney disease with heart failure and stage 1 through stage 4 chronic kidney disease, or unspecified chronic kidney disease: Secondary | ICD-10-CM | POA: Insufficient documentation

## 2019-09-29 DIAGNOSIS — Z87891 Personal history of nicotine dependence: Secondary | ICD-10-CM | POA: Diagnosis not present

## 2019-09-29 DIAGNOSIS — I472 Ventricular tachycardia: Secondary | ICD-10-CM | POA: Diagnosis not present

## 2019-09-29 DIAGNOSIS — I255 Ischemic cardiomyopathy: Secondary | ICD-10-CM | POA: Insufficient documentation

## 2019-09-29 DIAGNOSIS — Z794 Long term (current) use of insulin: Secondary | ICD-10-CM | POA: Diagnosis not present

## 2019-09-29 DIAGNOSIS — Z96649 Presence of unspecified artificial hip joint: Secondary | ICD-10-CM | POA: Insufficient documentation

## 2019-09-29 DIAGNOSIS — E1143 Type 2 diabetes mellitus with diabetic autonomic (poly)neuropathy: Secondary | ICD-10-CM | POA: Insufficient documentation

## 2019-09-29 DIAGNOSIS — E1122 Type 2 diabetes mellitus with diabetic chronic kidney disease: Secondary | ICD-10-CM | POA: Insufficient documentation

## 2019-09-29 DIAGNOSIS — Z8249 Family history of ischemic heart disease and other diseases of the circulatory system: Secondary | ICD-10-CM | POA: Insufficient documentation

## 2019-09-29 DIAGNOSIS — I2510 Atherosclerotic heart disease of native coronary artery without angina pectoris: Secondary | ICD-10-CM | POA: Diagnosis not present

## 2019-09-29 DIAGNOSIS — M25569 Pain in unspecified knee: Secondary | ICD-10-CM | POA: Diagnosis not present

## 2019-09-29 DIAGNOSIS — E039 Hypothyroidism, unspecified: Secondary | ICD-10-CM | POA: Diagnosis not present

## 2019-09-29 DIAGNOSIS — G8929 Other chronic pain: Secondary | ICD-10-CM | POA: Insufficient documentation

## 2019-09-29 DIAGNOSIS — Z79899 Other long term (current) drug therapy: Secondary | ICD-10-CM | POA: Insufficient documentation

## 2019-09-29 DIAGNOSIS — K3184 Gastroparesis: Secondary | ICD-10-CM | POA: Diagnosis not present

## 2019-09-29 LAB — LIPID PANEL
Cholesterol: 135 mg/dL (ref 0–200)
HDL: 56 mg/dL (ref >40)
LDL Cholesterol: 56 mg/dL (ref 0–99)
Total CHOL/HDL Ratio: 2.4 RATIO
Triglycerides: 116 mg/dL (ref <150)
VLDL: 23 mg/dL (ref 0–40)

## 2019-09-29 LAB — COMPREHENSIVE METABOLIC PANEL
ALT: 15 U/L (ref 0–44)
AST: 13 U/L — ABNORMAL LOW (ref 15–41)
Albumin: 3.3 g/dL — ABNORMAL LOW (ref 3.5–5.0)
Alkaline Phosphatase: 52 U/L (ref 38–126)
Anion gap: 8 (ref 5–15)
BUN: 31 mg/dL — ABNORMAL HIGH (ref 8–23)
CO2: 21 mmol/L — ABNORMAL LOW (ref 22–32)
Calcium: 8.7 mg/dL — ABNORMAL LOW (ref 8.9–10.3)
Chloride: 109 mmol/L (ref 98–111)
Creatinine, Ser: 1.75 mg/dL — ABNORMAL HIGH (ref 0.44–1.00)
GFR calc Af Amer: 33 mL/min — ABNORMAL LOW (ref >60)
GFR calc non Af Amer: 29 mL/min — ABNORMAL LOW (ref >60)
Glucose, Bld: 182 mg/dL — ABNORMAL HIGH (ref 70–99)
Potassium: 4.6 mmol/L (ref 3.5–5.1)
Sodium: 138 mmol/L (ref 135–145)
Total Bilirubin: 0.8 mg/dL (ref 0.3–1.2)
Total Protein: 6.5 g/dL (ref 6.5–8.1)

## 2019-09-29 MED ORDER — DAPAGLIFLOZIN PROPANEDIOL 10 MG PO TABS: 10.0000 mg | ORAL_TABLET | Freq: Every day | ORAL | 5 refills | Status: DC

## 2019-09-29 MED ORDER — ENTRESTO 49-51 MG PO TABS: 1.0000 | ORAL_TABLET | Freq: Two times a day (BID) | ORAL | 6 refills | Status: DC

## 2019-09-29 NOTE — Patient Instructions (Signed)
DECREASE Entresto to 49/51 mg, one tab twice daily CHANGE Farxiga to 10 mg ,one tab daily  Labs today We will only contact you if something comes back abnormal or we need to make some changes. Otherwise no news is good news!  Your physician recommends that you schedule a follow-up appointment in: 4 months with Dr Aundra Dubin  Do the following things EVERYDAY: 1) Weigh yourself in the morning before breakfast. Write it down and keep it in a log. 2) Take your medicines as prescribed 3) Eat low salt foods--Limit salt (sodium) to 2000 mg per day.  4) Stay as active as you can everyday 5) Limit all fluids for the day to less than 2 liters  At the [facility], you and your health needs are our priority. As part of our continuing mission to provide you with exceptional heart care, we have created designated Provider Care Teams. These Care Teams include your primary Cardiologist (physician) and Advanced Practice Providers (APPs- Physician Assistants and Nurse Practitioners) who all work together to provide you with the care you need, when you need it.   You may see any of the following providers on your designated Care Team at your next follow up: [name] Dr Glori Bickers . Dr Loralie Champagne . Darrick Grinder, NP . Lyda Jester, PA . Audry Riles, PharmD   Please be sure to bring in all your medications bottles to every appointment.

## 2019-10-01 NOTE — Progress Notes (Signed)
PCP:  Biagio Borg, MD  Cardiologist:  Loralie Champagne, MD   History of Present Illness: [name] is a 74 y.o. female who has a history of CAD, ischemic cardiomyopathy, and atrial fibrillation.  Patient was hospitalized in 11/12 at [facility] for VT with ICD discharge.  She had a left heart cath showing patent stents.  EF was 35-40% by echo.  She is on amiodarone.  She has had periodic problems with creatinine rising with medication adjustments.  Echo in 12/18 showed EF 35-40%, inferior and septal hypokinesis, mild MR.    Echo in 5/20 showed EF 40%, normal RV, mild AI.   She did not tolerate eplerenone due to nausea.  Echo in 4/21 showed EF 40-45% with inferior/inferoseptal HK, normal RV.   She is currently living with her daughter in [place]. Main limitation is chronic knee pain.  She is taking Farxiga 10 mg qod currently as she had nausea when she first started it, but also got her 2nd COVID shot at the same time.  She has episodes of lightheadedness, SBP in 90s at times.  Significant fatigue.  No significant exertional dyspnea.  No chest pain.  Weight down 8 lbs.   St Jude device interrogation: 82% a-paced, 11% v-paced, 1.2 atrial fibrillation.   Labs (10/12): LDL 73, HDL 44 Labs (11/12): K 3.9, creatinine 1.1, LFTs normal, TSH normal Labs (12/12): K 3.9, creatinine 1.5, proBNP 18 Labs (1/13): LDL 82, HDL 57 Labs (2/13): K 4.3, creatinine 1.5 Labs (5/13): creatinine 2.4 => 1.7, LFTs normal, TSH normal, proBNP 18 Labs (6/13): K 4.2, creatinine 1.7 Labs (10/13): K 4.3, creatinine 1.4 Labs (4/14); LFTs normal, TSH normal, LDL 71, HDL 58 Labs (5/14): K 4.5, creatinine 1.4 Labs (8/14): TSH normal, LFTs normal Labs (9/14): K 4.2, creatinine 1.8, LDL 75, HDL 54 Labs (07/31/13) K 3.7 Creatinine 1.8 BNP 148  Labs (9/15): K 4.3, creatinine 1.24, LFTs normal, LDL 81, HDL 58, TSH normal Labs (9/16): K 4.4, creatinine 1.44, LDL 64, HDL 54, LFTs normal, TSH normal, HCT 38 Labs (2/17): K  4.9, creatinine 1.76, HCT 38.6 Labs (1/19): LDL 71, HDL 58, K 4.8, creatinine 1.26, LFTs normal, hgb 12.9.  Labs (2/19): Creatinine 1.35, K 4.9 Labs (6/19): K 4.4, creatinine 1.57 Labs (8/19): K 5, creatinine 1.59, LFTs normal Labs (2/20): K 4.4, creatinine 1.4, LDL 72, HDL 55, normal LFTs Labs (5/20): LDL 81, K 4.8, creatinine 1.69 Labs (10/20): hgb 13.6, K 4.2, creatinine 1.62, LFTs normal, LDL 81, HDL 50 Labs (12/20): LDL 88 Labs (3/21): K 4, creatinine 1.56  PMH: 1. Diabetes mellitus 2. CVA, TIA in 2010 3. HTN 4. CKD stage 3 5. H/o TAH 6. H/o CCY 7. Sciatica 8. Atrial fibrillation 9. CAD: s/p LAD and RCA PCI.  Last LHC in 11/12 with patent proximal LAD stent, ostial 70% D1 (jailed by stent), mild LAD stent patent, patent RCA stents, EF 40% with global hypokinesis.  10. Ischemic cardiomyopathy: Echo (10/12): EF 35-40%, moderate focal basal septal hypertrophy, inferior akinesis, grade I diastolic dysfunction, moderate aortic insufficiency. St Jude dual chamber ICD.  Spironolactone stopped when creatinine rose to 2.5.  Echo (5/14) with EF 40-45%, mild AI.  Echo (6/16) with EF 40-45%, inferior/inferoseptal hypokinesis, mild LVH.  - Echo (12/18): EF 35-40%, inferior and septal hypokinesis, mild MR, mild AI.  - Echo (5/20): EF 40%, inferior and inferoseptal hypokinesis, normal RV size and systolic function, mild AI, normal IVC.  - Unable to tolerate eplerenone.  - Echo (4/21): EF  40-45%, inferior/inferoseptal HK, normal RV, mild AI.  11. Aortic insufficiency: moderate in past but mild on most recent echo.   12. 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  13. Gastroparesis 14. Hypothyroidism  SH: Divorced.  3 children.  Quit smoking in 1997. Lives in [place] now with daughter.  [place].   FH: CAD  ROS: All systems reviewed and negative except as per HPI.   Current Outpatient Medications  Medication Sig Dispense Refill  . acetaminophen (TYLENOL) 500 MG tablet Take 500 mg by mouth daily  as needed for moderate pain or headache.    [name] amiodarone (PACERONE) 100 MG tablet TAKE 1 TABLET BY MOUTH EVERY DAY 90 tablet 3  . Blood Glucose Monitoring Suppl (ACCU-CHEK NANO SMARTVIEW) W/DEVICE KIT Use to test blood sugar 4 times daily as instructed. Dx code: E11.59 1 kit 0  . carvedilol (COREG) 25 MG tablet TAKE 1 TABLET BY MOUTH 2 (TWO) TIMES DAILY WITH A MEAL. 180 tablet 3  . Cholecalciferol (VITAMIN D3) 2000 UNITS TABS Take 2,000 Units by mouth daily.     . dapagliflozin propanediol (FARXIGA) 10 MG TABS tablet Take 1 tablet (10 mg total) by mouth daily before breakfast. 30 tablet 5  . dicyclomine (BENTYL) 10 MG capsule TAKE 1 CAPSULE (10 MG TOTAL) BY MOUTH 4 (FOUR) TIMES DAILY BEFORE MEALS AND AT BEDTIME. 120 capsule 1  . ELIQUIS 5 MG TABS tablet TAKE 1 TABLET BY MOUTH TWICE A DAY 60 tablet 6  . fluticasone (FLONASE) 50 MCG/ACT nasal spray Place into the nose.    . furosemide (LASIX) 20 MG tablet Take 74m (2 tabs) daily for 3 days, THEN 225m(1 tab) daily after that 90 tablet 3  . gabapentin (NEURONTIN) 400 MG capsule TAKE 1 CAPSULE (400 MG TOTAL) BY MOUTH 2 (TWO) TIMES DAILY. 180 capsule 1  . glucose blood (ACCU-CHEK SMARTVIEW) test strip Use to test blood sugar 4 times daily as instructed. Dx code E11.59 200 each 11  . insulin aspart (NOVOLOG) 100 UNIT/ML injection Inject 10-14 Units into the skin 3 (three) times daily with meals. 37.8 mL 2  . LEVEMIR FLEXTOUCH 100 UNIT/ML FlexPen INJECT 40 UNITS INTO THE SKIN DAILY AT 10 PM. 15 mL 1  . levothyroxine (SYNTHROID) 25 MCG tablet Take 1 tablet (25 mcg total) by mouth daily before breakfast. Annual appt is due must see provider for future refills 30 tablet 0  . levothyroxine (SYNTHROID) 50 MCG tablet     . magnesium oxide (MAG-OX) 400 MG tablet TAKE 1 TABLET BY MOUTH TWICE A DAY 180 tablet 0  . metFORMIN (GLUCOPHAGE-XR) 500 MG 24 hr tablet TAKE 3 TABLETS (1,500 MG TOTAL) BY MOUTH DAILY WITH SUPPER. 270 tablet 3  . Multiple Vitamins-Minerals  (EYE VITAMINS PO) Take 1 tablet by mouth 2 (two) times daily.     . nitroGLYCERIN (NITROSTAT) 0.4 MG SL tablet Place 1 tablet (0.4 mg total) under the tongue every 5 (five) minutes as needed for chest pain. 25 tablet 1  . omeprazole (PRILOSEC) 20 MG capsule TAKE 1 CAPSULE BY MOUTH EVERY DAY 90 capsule 1  . rosuvastatin (CRESTOR) 40 MG tablet Take 1 tablet (40 mg total) by mouth daily. 30 tablet 5  . traMADol (ULTRAM) 50 MG tablet TAKE 1 TABLET BY MOUTH EVERY 6 HOURS AS NEEDED FOR PAIN 120 tablet 2  . sacubitril-valsartan (ENTRESTO) 49-51 MG Take 1 tablet by mouth 2 (two) times daily. 60 tablet 6   No current facility-administered medications for this encounter.   BP  98/62   Pulse 64   Wt 91.6 kg (202 lb)   SpO2 96%   BMI 35.78 kg/m  General: NAD Neck: No JVD, no thyromegaly or thyroid nodule.  Lungs: Clear to auscultation bilaterally with normal respiratory effort. CV: Nondisplaced PMI.  Heart regular S1/S2, no S3/S4, no murmur.  No peripheral edema.  No carotid bruit.  Normal pedal pulses.  Abdomen: Soft, nontender, no hepatosplenomegaly, no distention.  Skin: Intact without lesions or rashes.  Neurologic: Alert and oriented x 3.  Psych: Normal affect. Extremities: No clubbing or cyanosis.  HEENT: Normal.   Assessment/Plan 1. Atrial fibrillation: Paroxysmal.  NSR today. - Continue amiodarone 100 mg daily.  Check LFTs today. She is on Levoxyl followed by PCP. She will need regular eye exams.  - Continue Eliquis 5 mg bid.  2. Coronary artery disease: No chest pain.  Nonobstructive disease on last cath.  - Continue statin.  - No ASA given Eliquis use.   3. Ventricular tachycardia - On amiodarone 100 mg daily with suppression of VT. 4. Chronic systolic CHF: Ischemic cardiomyopathy.  EF 40-45% 4/21 echo. She has a [employer] officer, political party ICD. NYHA class II symptoms, stable.  She is not volume overloaded on exam. She has orthostatic symptoms at times with SBP down to 90s.  - Decrease Entresto to 49/51  bid with hypotension.   - Continue Coreg 25 mg bid.  - Continue Lasix 20 mg daily.   - She has tolerated neither spironolactone nor eplerenone.  - Increase dapagliflozin to 10 mg daily.  She seems to be tolerating it better now.  5. CKD stage 3: BMET today.   6. Hypothyroidism: Managed by PCP.  7. Hyperlipidemia: On Crestor, check lipids today.   Recommended follow-up:  4 months  Signed, Loralie Champagne, MD  10/01/2019  [place] [address] [facility] and [place] [address] [phone] (office) [phone] (fax)

## 2019-10-03 ENCOUNTER — Other Ambulatory Visit: Payer: Self-pay | Admitting: Internal Medicine

## 2019-10-03 NOTE — Telephone Encounter (Signed)
Please refill as per office routine med refill policy (all routine meds refilled for 3 mo or monthly per pt preference up to one year from last visit, then month to month grace period for 3 mo, then further med refills will have to be denied)  

## 2019-10-30 ENCOUNTER — Telehealth: Payer: Self-pay

## 2019-10-30 NOTE — Telephone Encounter (Signed)
I called the pt to help her with her home monitor but did not get an answer.

## 2019-11-25 ENCOUNTER — Other Ambulatory Visit (HOSPITAL_COMMUNITY): Payer: Self-pay | Admitting: Cardiology

## 2019-11-30 ENCOUNTER — Other Ambulatory Visit: Payer: Self-pay | Admitting: Internal Medicine

## 2019-11-30 NOTE — Telephone Encounter (Signed)
Please refill as per office routine med refill policy (all routine meds refilled for 3 mo or monthly per pt preference up to one year from last visit, then month to month grace period for 3 mo, then further med refills will have to be denied)  

## 2019-12-04 ENCOUNTER — Ambulatory Visit: Payer: Medicare PPO | Admitting: Internal Medicine

## 2019-12-04 ENCOUNTER — Telehealth (HOSPITAL_COMMUNITY): Payer: Self-pay

## 2019-12-04 NOTE — Telephone Encounter (Signed)
Surgical clearance sent from [facility] for tooth extractions. Wondering about Eliquis use for procedure   Per Dr. Aundra Dubin: "she can hold Eliquis for 2 days prior to dental procedure"  Faxed to [phone]

## 2019-12-20 ENCOUNTER — Ambulatory Visit (INDEPENDENT_AMBULATORY_CARE_PROVIDER_SITE_OTHER): Payer: Medicare PPO | Admitting: Emergency Medicine

## 2019-12-20 DIAGNOSIS — I255 Ischemic cardiomyopathy: Secondary | ICD-10-CM

## 2019-12-21 LAB — CUP PACEART REMOTE DEVICE CHECK
Battery Remaining Longevity: 5 mo
Battery Remaining Percentage: 4 %
Battery Voltage: 2.62 V
Brady Statistic AP VP Percent: 6.6 %
Brady Statistic AP VS Percent: 82 %
Brady Statistic AS VP Percent: 1.1 %
Brady Statistic AS VS Percent: 9.2 %
Brady Statistic RA Percent Paced: 84 %
Brady Statistic RV Percent Paced: 8.1 %
Date Time Interrogation Session: 20210929132345
HighPow Impedance: 48 Ohm
Implantable Lead Implant Date: 20070117
Implantable Lead Implant Date: 20070117
Implantable Lead Location: 753859
Implantable Lead Location: 753860
Implantable Lead Model: 7040
Implantable Pulse Generator Implant Date: 20130212
Lead Channel Impedance Value: 430 Ohm
Lead Channel Impedance Value: 540 Ohm
Lead Channel Pacing Threshold Amplitude: 0.5 V
Lead Channel Pacing Threshold Amplitude: 1 V
Lead Channel Pacing Threshold Pulse Width: 0.5 ms
Lead Channel Pacing Threshold Pulse Width: 0.5 ms
Lead Channel Sensing Intrinsic Amplitude: 11.8 mV
Lead Channel Sensing Intrinsic Amplitude: 4.5 mV
Lead Channel Setting Pacing Amplitude: 2 V
Lead Channel Setting Pacing Amplitude: 2.5 V
Lead Channel Setting Pacing Pulse Width: 0.5 ms
Lead Channel Setting Sensing Sensitivity: 0.5 mV
Pulse Gen Serial Number: 819806

## 2019-12-24 ENCOUNTER — Other Ambulatory Visit (HOSPITAL_COMMUNITY): Payer: Self-pay | Admitting: Cardiology

## 2019-12-25 NOTE — Progress Notes (Signed)
Remote ICD transmission.   

## 2019-12-29 ENCOUNTER — Other Ambulatory Visit: Payer: Self-pay | Admitting: Internal Medicine

## 2020-01-21 DIAGNOSIS — T424X2A Poisoning by benzodiazepines, intentional self-harm, initial encounter: Secondary | ICD-10-CM

## 2020-01-21 HISTORY — DX: Poisoning by benzodiazepines, intentional self-harm, initial encounter: T42.4X2A

## 2020-01-23 DIAGNOSIS — F4323 Adjustment disorder with mixed anxiety and depressed mood: Secondary | ICD-10-CM | POA: Insufficient documentation

## 2020-02-05 ENCOUNTER — Other Ambulatory Visit: Payer: Self-pay

## 2020-02-05 ENCOUNTER — Encounter: Payer: Self-pay | Admitting: Internal Medicine

## 2020-02-05 ENCOUNTER — Telehealth (INDEPENDENT_AMBULATORY_CARE_PROVIDER_SITE_OTHER): Payer: Medicare PPO | Admitting: Internal Medicine

## 2020-02-05 DIAGNOSIS — E1165 Type 2 diabetes mellitus with hyperglycemia: Secondary | ICD-10-CM | POA: Diagnosis not present

## 2020-02-05 DIAGNOSIS — E1142 Type 2 diabetes mellitus with diabetic polyneuropathy: Secondary | ICD-10-CM | POA: Diagnosis not present

## 2020-02-05 DIAGNOSIS — E1151 Type 2 diabetes mellitus with diabetic peripheral angiopathy without gangrene: Secondary | ICD-10-CM | POA: Diagnosis not present

## 2020-02-05 DIAGNOSIS — E785 Hyperlipidemia, unspecified: Secondary | ICD-10-CM | POA: Diagnosis not present

## 2020-02-05 DIAGNOSIS — IMO0002 Reserved for concepts with insufficient information to code with codable children: Secondary | ICD-10-CM

## 2020-02-05 MED ORDER — METFORMIN HCL ER 500 MG PO TB24: 1000.0000 mg | ORAL_TABLET | Freq: Every day | ORAL | 3 refills | Status: DC

## 2020-02-05 MED ORDER — LEVEMIR FLEXTOUCH 100 UNIT/ML ~~LOC~~ SOPN: PEN_INJECTOR | SUBCUTANEOUS | 1 refills | Status: DC

## 2020-02-05 NOTE — Progress Notes (Signed)
Patient ID: [name], female   DOB: 1945/03/28, 74 y.o.   MRN: [id number]  Patient location: Home My location: Office Persons participating in the virtual visit: patient, provider  Referring Provider: Luciano Cutter, MD  I connected with the patient on 02/05/20 at  9:40 AM EST by telephone and verified that I am speaking with the correct person.   I discussed the limitations of evaluation and management by telephone and the availability of in person appointments. The patient expressed understanding and agreed to proceed.   Details of the encounter are shown below.   HPI: [name] is a 74 y.o.-year-old female, presenting for f/u for DM2, insulin-dependent, uncontrolled, with complications (CAD, ICM - had ICD, peripheral neuropathy, gastroparesis). Last visit 10 months ago (telephone).  She had a steroid inj >> sugars higher.  Reviewed HbA1c levels: 01/22/2020: HbA1c 8% 03/13/2019: HbA1c 7.1% Lab Results  Component Value Date   HGBA1C 7.7 (A) 11/23/2018   HGBA1C 7.6 (H) 08/16/2018   HGBA1C 6.1 (A) 02/25/2018  07/03/2013: 7.2% - through her insurance  She is now on: - Metformin XR 1000 >> 1500 mg at dinnertime - Levemir 35 units at night >> moved to a.m. >> 35 units at night - Novolog: B: 10 units, L: 5-6 units, D: 5-6 units >> units before meals   Pt checks her sugars 3-4 times a day: - am: 61, 102-184 >> 85-150 >> 110-150 >> 86, 110-182, 213, 257 - after b'fast: 175 >> n/c - before lunch: 110-187, 240, 256 >> 94-150, 251 >> 126-310 (steroid inj) - after lunch:  45, 47, 140-150 >> n/c - before dinner: 97-184, 220, 257 >> 120-180, 352 >> ? - bedtime: 71, 110-275 >> n/c >> 87-258 >> 115-210 >> 140-202 Lowest sugar was 45 >> .Marland KitchenMarland Kitchen85, but 50s at night >> 94 >> 86;  she has hypoglycemia awareness in the 90s. Highest sugar was 300 x1 >> 256 >> 352 (correction of a 94) >> 310.  -+ CKD, last BUN/creatinine:   Lab Results  Component Value Date   BUN 31 (H) 09/29/2019    CREATININE 1.75 (H) 09/29/2019   GFR low -she sees nephrology: Lab Results  Component Value Date   GFRNONAA 29 (L) 09/29/2019   GFRNONAA 32 (L) 01/10/2019   GFRNONAA 28 (L) 08/16/2018   GFRNONAA 38 (L) 05/20/2018   GFRNONAA 32 (L) 11/05/2017   ACR normal: Lab Results  Component Value Date   MICRALBCREAT 5.3 08/16/2018   MICRALBCREAT 9.4 04/08/2017   MICRALBCREAT 3.2 06/25/2016   MICRALBCREAT 1.6 12/21/2014   MICRALBCREAT 1.0 12/15/2012  On Entresto.  -+ HL; last set of lipids: Lab Results  Component Value Date   CHOL 135 09/29/2019   HDL 56 09/29/2019   LDLCALC 56 09/29/2019   TRIG 116 09/29/2019   CHOLHDL 2.4 09/29/2019  On Crestor.  - last eye exam was in 2018-06-12: No DR  - she has numbness and tingling in her feet-on Neurontin.  She is on Eliquis.  Also, on amiodarone.  She has a history of hypothyroidism-latest TSH is normal: Lab Results  Component Value Date   TSH 3.02 08/16/2018   She continues on levothyroxine 25 mcg daily.  Her 11 y/o son passed away in Jun 12, 2018 (had cirrhosis >> developed cough, fever, generalized pain+ bilateral PNA (Covid 19 negative).    Her nephew died in 13-Jul-2018 and her brother died 2018-11-12.    ROS: Constitutional: no weight gain/no weight loss, no fatigue, no subjective hyperthermia, no subjective hypothermia Eyes: no  blurry vision, no xerophthalmia ENT: no sore throat, no nodules palpated in neck, no dysphagia, no odynophagia, no hoarseness Cardiovascular: no CP/no SOB/no palpitations/no leg swelling Respiratory: no cough/no SOB/no wheezing Gastrointestinal: no N/no V/no D/no C/no acid reflux Musculoskeletal: no muscle aches/no joint aches Skin: no rashes, no hair loss Neurological: no tremors/+ numbness/+ tingling/no dizziness  I reviewed pt's medications, allergies, PMH, social hx, family hx, and changes were documented in the history of present illness. Otherwise, unchanged from my initial visit note.  Past Medical  History:  Diagnosis Date  . Allergy   . Arthritis   . Atrial fibrillation ([place])    controlled with amiodarone, on coumadin  . Chronic renal insufficiency   . Chronic systolic heart failure ([place])   . Congestive heart failure ([place])   . Coronary artery disease 01/31/2011  . Diabetes mellitus    type 1  . 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   . History of chicken pox   . Hyperlipidemia   . Hypertension   . Ischemic cardiomyopathy   . Lumbar spondylosis 01/11/2012  . Sleep apnea   . Stroke [facility]) 2010   eye doctor said she had TIA  . Ventricular tachycardia ([place])    Polymorphic   Past Surgical History:  Procedure Laterality Date  . ABDOMINAL HYSTERECTOMY  1995  . CARDIAC DEFIBRILLATOR PLACEMENT    . CHOLECYSTECTOMY  1985  . COLONOSCOPY WITH PROPOFOL N/A 02/24/2018   Procedure: COLONOSCOPY WITH PROPOFOL;  Surgeon: Milus Banister, MD;  Location: [facility] ENDOSCOPY;  Service: Endoscopy;  Laterality: N/A;  . ICD  2003/2007   implanted by Dr Rollene Fare, most recent generator change 2/13 by Dr Rayann Heman, Analyze ST study patient  . IMPLANTABLE CARDIOVERTER DEFIBRILLATOR (ICD) GENERATOR CHANGE N/A 05/05/2011   Procedure: ICD GENERATOR CHANGE;  Surgeon: Thompson Grayer, MD;  Location: [facility] CATH LAB;  Service: Cardiovascular;  Laterality: N/A;  . LEFT HEART CATHETERIZATION WITH CORONARY ANGIOGRAM N/A 01/30/2011   Procedure: LEFT HEART CATHETERIZATION WITH CORONARY ANGIOGRAM;  Surgeon: Larey Dresser, MD;  Location[facility] C[facility] LAB;  Service: Cardiovascular;  Laterality: N/A;  . POLYPECTOMY  02/24/2018   Procedure: POLYPECTOMY;  Surgeon: Milus Banister, MD;  Location: [facility] ENDOSCOPY;  Service: Endoscopy;;  . [place] History   Socioeconomic History  . Marital status: Divorced    Spouse name: Not on file  . Number of children: 3  . Years of education: 33  . Highest education level: Not on file  Occupational History  . Occupation: Retried  Tobacco Use  . Smoking status:  Former Smoker    Quit date: 08/16/1995    Years since quitting: 24.4  . Smokeless tobacco: Never Used  Vaping Use  . Vaping Use: Never used  Substance and Sexual Activity  . Alcohol use: No  . Drug use: No  . Sexual activity: Never    Birth control/protection: Post-menopausal  Other Topics Concern  . Not on file  Social History Narrative   Regular exercise-no   Caffeine Use-no   Social Determinants of Health   Financial Resource Strain:   . Difficulty of Paying Living Expenses: Not on file  Food Insecurity:   . Worried About Charity fundraiser in the Last Year: Not on file  . Ran Out of Food in the Last Year: Not on file  Transportation Needs:   . Lack of Transportation (Medical): Not on file  . Lack of Transportation (Non-Medical): Not on file  Physical Activity:   .  Days of Exercise per Week: Not on file  . Minutes of Exercise per Session: Not on file  Stress:   . Feeling of Stress : Not on file  Social Connections:   . Frequency of Communication with Friends and Family: Not on file  . Frequency of Social Gatherings with Friends and Family: Not on file  . Attends Religious Services: Not on file  . Active Member of Clubs or Organizations: Not on file  . Attends Archivist Meetings: Not on file  . Marital Status: Not on file  Intimate Partner Violence:   . Fear of Current or Ex-Partner: Not on file  . Emotionally Abused: Not on file  . Physically Abused: Not on file  . Sexually Abused: Not on file   Current Outpatient Medications on File Prior to Visit  Medication Sig Dispense Refill  . acetaminophen (TYLENOL) 500 MG tablet Take 500 mg by mouth daily as needed for moderate pain or headache.    [name] amiodarone (PACERONE) 100 MG tablet TAKE 1 TABLET BY MOUTH EVERY DAY 90 tablet 3  . Blood Glucose Monitoring Suppl (ACCU-CHEK NANO SMARTVIEW) W/DEVICE KIT Use to test blood sugar 4 times daily as instructed. Dx code: E11.59 1 kit 0  . carvedilol (COREG) 25 MG tablet  TAKE 1 TABLET BY MOUTH 2 (TWO) TIMES DAILY WITH A MEAL. 180 tablet 3  . Cholecalciferol (VITAMIN D3) 2000 UNITS TABS Take 2,000 Units by mouth daily.     . dapagliflozin propanediol (FARXIGA) 10 MG TABS tablet Take 1 tablet (10 mg total) by mouth daily before breakfast. 30 tablet 5  . dicyclomine (BENTYL) 10 MG capsule TAKE 1 CAPSULE (10 MG TOTAL) BY MOUTH 4 (FOUR) TIMES DAILY BEFORE MEALS AND AT BEDTIME. 120 capsule 1  . ELIQUIS 5 MG TABS tablet TAKE 1 TABLET BY MOUTH TWICE A DAY 60 tablet 11  . fluticasone (FLONASE) 50 MCG/ACT nasal spray Place into the nose.    . furosemide (LASIX) 20 MG tablet Take $RemoveBef'40mg'[id number]$  (2 tabs) daily for 3 days, THEN $RemoveBe'20mg'SwzCcOcRP$  (1 tab) daily after that 90 tablet 3  . gabapentin (NEURONTIN) 400 MG capsule TAKE 1 CAPSULE (400 MG TOTAL) BY MOUTH 2 (TWO) TIMES DAILY. 180 capsule 1  . glucose blood (ACCU-CHEK SMARTVIEW) test strip Use to test blood sugar 4 times daily as instructed. Dx code E11.59 200 each 11  . insulin aspart (NOVOLOG) 100 UNIT/ML injection Inject 10-14 Units into the skin 3 (three) times daily with meals. 37.8 mL 2  . LEVEMIR FLEXTOUCH 100 UNIT/ML FlexPen INJECT 40 UNITS INTO THE SKIN DAILY AT 10 PM. 15 mL 1  . levothyroxine (SYNTHROID) 25 MCG tablet Take 1 tablet (25 mcg total) by mouth daily before breakfast. Annual appt is due must see provider for future refills 30 tablet 0  . levothyroxine (SYNTHROID) 50 MCG tablet     . magnesium oxide (MAG-OX) 400 MG tablet TAKE 1 TABLET BY MOUTH TWICE A DAY 180 tablet 0  . metFORMIN (GLUCOPHAGE-XR) 500 MG 24 hr tablet TAKE 3 TABLETS (1,500 MG TOTAL) BY MOUTH DAILY WITH SUPPER. 270 tablet 3  . Multiple Vitamins-Minerals (EYE VITAMINS PO) Take 1 tablet by mouth 2 (two) times daily.     . nitroGLYCERIN (NITROSTAT) 0.4 MG SL tablet Place 1 tablet (0.4 mg total) under the tongue every 5 (five) minutes as needed for chest pain. 25 tablet 1  . omeprazole (PRILOSEC) 20 MG capsule TAKE 1 CAPSULE BY MOUTH EVERY DAY 90 capsule 1  .  rosuvastatin (  CRESTOR) 40 MG tablet TAKE 1 TABLET BY MOUTH EVERY DAY 90 tablet 3  . sacubitril-valsartan (ENTRESTO) 49-51 MG Take 1 tablet by mouth 2 (two) times daily. 60 tablet 6  . traMADol (ULTRAM) 50 MG tablet TAKE 1 TABLET BY MOUTH EVERY 6 HOURS AS NEEDED FOR PAIN 120 tablet 2   No current facility-administered medications on file prior to visit.   Allergies  Allergen Reactions  . Veltassa [Patiromer] Diarrhea  . Darvon Other (See Comments)    indigestion  . Promethazine Hcl Other (See Comments)    hyperactivity  . Spironolactone Diarrhea and Nausea And Vomiting  . Plavix [Clopidogrel Bisulfate] Rash   Family History  Problem Relation Age of Onset  . Diabetes Mother   . Heart disease Mother   . Hyperlipidemia Mother   . Hypertension Mother   . Heart disease Father   . Heart attack Father   . Hypertension Father   . Early death Brother 90  . Breast cancer Sister   . Lung cancer Sister   . Irritable bowel syndrome Sister   . Colon cancer Maternal Aunt   . Esophageal cancer Neg Hx   . Colon polyps Neg Hx     PE: There were no vitals taken for this visit. There is no height or weight on file to calculate BMI.  Wt Readings from Last 3 Encounters:  09/29/19 202 lb (91.6 kg)  06/27/19 210 lb 12.8 oz (95.6 kg)  06/27/19 210 lb (95.3 kg)   Constitutional:  in NAD  The physical exam was not performed (virtual visit).  ASSESSMENT: 1. DM2, insulin-dependent, controlled, with complications - CAD, ICM - s/p ICD placement - CKD - PN - on neurontin - gastroparesis per [facility] 06/18/2012 >> 60 minutes: 92%, 120 minutes: 82%  2. 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 HL  PLAN:  1. Patient with longstanding, previously uncontrolled type 2 diabetes, on Metformin and basal-bolus insulin regimen.  At last visit, sugars were still above goal, but upon questioning, she was taking less NovoLog than advised with lunch and dinner.  Sugars were above target before dinner and at bedtime.  We increased the  dose of NovoLog with his meals.  At last visit, she was telling me that she moved the Levemir back to bedtime as she preferred to take it at night rather than in the morning.  Since she did not have lows overnight, we continued Levemir in HS. HbA1c in 02/2019 was 7.1%.  We could not check an HbA1c at last visit since the visit was conducted by telephone. -At today's visit, sugars are higher, especially after she got the steroid injection in her knee.  Upon questioning, she is mostly using the lower doses of NovoLog recommended and we will increase these today.  However, unfortunately, her kidney function is still quite low, so I advised her to decrease the Metformin ER dose to only 1000 mg with dinner.  We will need her nephrologist's input to see if we need to stop it completely. - I suggested to: Patient Instructions  Please decrease: - Metformin XR 1000 mg with dinner  Continue: - Levemir 35 units at bedtime  Please increase: - Novolog 10-16 units before meals (use the higher dose after a steroid inj)  Please return in 3 months with your sugar log.   - advised to check sugars at different times of the day - 3x a day, rotating check times - advised for yearly eye exams >> she is UTD - return to  clinic in 3 months   2. PN -Stable, no new complaints -Continues on Neurontin without side effects  3. HL -Reviewed latest lipid panel from 09/2019: All fractions at goal: Lab Results  Component Value Date   CHOL 135 09/29/2019   HDL 56 09/29/2019   LDLCALC 56 09/29/2019   TRIG 116 09/29/2019   CHOLHDL 2.4 09/29/2019  -Continues Crestor 20 without side effects  - Total time spent for the visit: 12 min, in obtaining medical information from the pt and the chart, reviewing her  previous labs, evaluations, and treatments, reviewing her symptoms, counseling her about her conditions (please see the discussed topics above), and developing a plan to further investigate and treat them.  Philemon Kingdom, MD PhD [facility] Endocrinology

## 2020-02-05 NOTE — Patient Instructions (Addendum)
Please decrease: - Metformin XR 1000 mg with dinner  Continue: - Levemir 35 units at bedtime  Please increase: - Novolog 10-16 units before meals (use the higher dose after a steroid inj)  Please return in 3 months with your sugar log.

## 2020-02-06 ENCOUNTER — Other Ambulatory Visit: Payer: Self-pay

## 2020-02-06 ENCOUNTER — Other Ambulatory Visit: Payer: Self-pay | Admitting: Internal Medicine

## 2020-02-06 MED ORDER — INSULIN ASPART 100 UNIT/ML ~~LOC~~ SOLN: 10.0000 [IU] | Freq: Three times a day (TID) | SUBCUTANEOUS | 2 refills | Status: DC

## 2020-02-06 NOTE — Telephone Encounter (Signed)
Error

## 2020-02-06 NOTE — Telephone Encounter (Signed)
Please refill as per office routine med refill policy (all routine meds refilled for 3 mo or monthly per pt preference up to one year from last visit, then month to month grace period for 3 mo, then further med refills will have to be denied)  

## 2020-02-06 NOTE — Telephone Encounter (Signed)
Medication refilled for NovoLog

## 2020-02-06 NOTE — Telephone Encounter (Signed)
New message      1. Which medications need to be refilled? (please list name of each medication and dose if known)   insulin aspart (NOVOLOG) 100 UNIT/ML injection -recent medication change - per pharmacy request - will explain when the CMA calls back   insulin detemir (LEVEMIR FLEXTOUCH) 100 UNIT/ML FlexPen --- only have one left   2. Which pharmacy/location (including street and city if local pharmacy) is medication to be sent to? [facility] [place]

## 2020-02-22 ENCOUNTER — Other Ambulatory Visit: Payer: Self-pay | Admitting: Internal Medicine

## 2020-02-23 NOTE — Telephone Encounter (Signed)
Please refill as per office routine med refill policy (all routine meds refilled for 3 mo or monthly per pt preference up to one year from last visit, then month to month grace period for 3 mo, then further med refills will have to be denied)  

## 2020-03-04 ENCOUNTER — Telehealth: Payer: Self-pay

## 2020-03-04 NOTE — Telephone Encounter (Signed)
Returning phone call. Patients device has reached ERI 03/04/20. Unable to leave VM.

## 2020-03-04 NOTE — Telephone Encounter (Signed)
The pt states her ICD vibrated twice this morning around 2 am. I had the pt to send a manual transmission.I let her know that she should not vibrate again since she sent the transmission and she has 3 months to get the ICD replace. I told her the nurse will give her a call back.

## 2020-03-04 NOTE — Telephone Encounter (Signed)
Patient returned phone call. Discussed with patient device is at RRT. A scheduler will call to make apt. To discuss gen change. Advised patient to call with questions or concerns. Verbalized understanding.

## 2020-03-10 ENCOUNTER — Other Ambulatory Visit (HOSPITAL_COMMUNITY): Payer: Self-pay | Admitting: Cardiology

## 2020-03-20 ENCOUNTER — Ambulatory Visit (INDEPENDENT_AMBULATORY_CARE_PROVIDER_SITE_OTHER): Payer: Medicare PPO

## 2020-03-20 ENCOUNTER — Encounter: Payer: Self-pay | Admitting: *Deleted

## 2020-03-20 DIAGNOSIS — I255 Ischemic cardiomyopathy: Secondary | ICD-10-CM

## 2020-03-20 DIAGNOSIS — I472 Ventricular tachycardia: Secondary | ICD-10-CM | POA: Diagnosis not present

## 2020-03-20 DIAGNOSIS — I4729 Other ventricular tachycardia: Secondary | ICD-10-CM

## 2020-03-20 LAB — CUP PACEART REMOTE DEVICE CHECK
Battery Remaining Longevity: 0 mo
Battery Voltage: 2.59 V
Brady Statistic AP VP Percent: 6.4 %
Brady Statistic AP VS Percent: 79 %
Brady Statistic AS VP Percent: 1 %
Brady Statistic AS VS Percent: 12 %
Brady Statistic RA Percent Paced: 81 %
Brady Statistic RV Percent Paced: 7.7 %
Date Time Interrogation Session: 20211229131021
HighPow Impedance: 48 Ohm
Implantable Lead Implant Date: 20070117
Implantable Lead Implant Date: 20070117
Implantable Lead Location: 753859
Implantable Lead Location: 753860
Implantable Lead Model: 7040
Implantable Pulse Generator Implant Date: 20130212
Lead Channel Impedance Value: 440 Ohm
Lead Channel Impedance Value: 510 Ohm
Lead Channel Pacing Threshold Amplitude: 0.5 V
Lead Channel Pacing Threshold Amplitude: 1 V
Lead Channel Pacing Threshold Pulse Width: 0.5 ms
Lead Channel Pacing Threshold Pulse Width: 0.5 ms
Lead Channel Sensing Intrinsic Amplitude: 11.8 mV
Lead Channel Sensing Intrinsic Amplitude: 5 mV
Lead Channel Setting Pacing Amplitude: 2 V
Lead Channel Setting Pacing Amplitude: 2.5 V
Lead Channel Setting Pacing Pulse Width: 0.5 ms
Lead Channel Setting Sensing Sensitivity: 0.5 mV
Pulse Gen Serial Number: 819806

## 2020-04-01 ENCOUNTER — Other Ambulatory Visit: Payer: Self-pay

## 2020-04-01 ENCOUNTER — Ambulatory Visit: Payer: Medicare PPO | Admitting: Internal Medicine

## 2020-04-01 ENCOUNTER — Encounter: Payer: Self-pay | Admitting: Internal Medicine

## 2020-04-01 ENCOUNTER — Encounter: Payer: Self-pay | Admitting: *Deleted

## 2020-04-01 VITALS — BP 132/66 | HR 65 | Ht 63.0 in | Wt 203.0 lb

## 2020-04-01 DIAGNOSIS — I472 Ventricular tachycardia, unspecified: Secondary | ICD-10-CM

## 2020-04-01 DIAGNOSIS — I5022 Chronic systolic (congestive) heart failure: Secondary | ICD-10-CM

## 2020-04-01 DIAGNOSIS — I480 Paroxysmal atrial fibrillation: Secondary | ICD-10-CM | POA: Diagnosis not present

## 2020-04-01 LAB — CUP PACEART INCLINIC DEVICE CHECK
Battery Remaining Longevity: 0 mo
Brady Statistic RA Percent Paced: 81 %
Brady Statistic RV Percent Paced: 7.7 %
Date Time Interrogation Session: 20220110122702
HighPow Impedance: 49.7777
Implantable Lead Implant Date: 20070117
Implantable Lead Implant Date: 20070117
Implantable Lead Location: 753859
Implantable Lead Location: 753860
Implantable Lead Model: 7040
Implantable Pulse Generator Implant Date: 20130212
Lead Channel Impedance Value: 475 Ohm
Lead Channel Impedance Value: 587.5 Ohm
Lead Channel Pacing Threshold Amplitude: 0.75 V
Lead Channel Pacing Threshold Amplitude: 1 V
Lead Channel Pacing Threshold Pulse Width: 0.5 ms
Lead Channel Pacing Threshold Pulse Width: 0.5 ms
Lead Channel Sensing Intrinsic Amplitude: 11.8 mV
Lead Channel Sensing Intrinsic Amplitude: 5 mV
Lead Channel Setting Pacing Amplitude: 2 V
Lead Channel Setting Pacing Amplitude: 2.5 V
Lead Channel Setting Pacing Pulse Width: 0.5 ms
Lead Channel Setting Sensing Sensitivity: 0.5 mV
Pulse Gen Serial Number: 819806

## 2020-04-01 LAB — CBC WITH DIFFERENTIAL/PLATELET
Basophils Absolute: 0 10*3/uL (ref 0.0–0.2)
Basos: 1 %
EOS (ABSOLUTE): 0.1 10*3/uL (ref 0.0–0.4)
Eos: 1 %
Hematocrit: 38.5 % (ref 34.0–46.6)
Hemoglobin: 12.5 g/dL (ref 11.1–15.9)
Immature Grans (Abs): 0 10*3/uL (ref 0.0–0.1)
Immature Granulocytes: 0 %
Lymphocytes Absolute: 1.7 10*3/uL (ref 0.7–3.1)
Lymphs: 22 %
MCH: 30.3 pg (ref 26.6–33.0)
MCHC: 32.5 g/dL (ref 31.5–35.7)
MCV: 93 fL (ref 79–97)
Monocytes Absolute: 0.6 10*3/uL (ref 0.1–0.9)
Monocytes: 9 %
Neutrophils Absolute: 5.1 10*3/uL (ref 1.4–7.0)
Neutrophils: 67 %
Platelets: 239 10*3/uL (ref 150–450)
RBC: 4.13 x10E6/uL (ref 3.77–5.28)
RDW: 14.2 % (ref 11.7–15.4)
WBC: 7.6 10*3/uL (ref 3.4–10.8)

## 2020-04-01 LAB — BASIC METABOLIC PANEL
BUN/Creatinine Ratio: 15 (ref 12–28)
BUN: 22 mg/dL (ref 8–27)
CO2: 18 mmol/L — ABNORMAL LOW (ref 20–29)
Calcium: 9 mg/dL (ref 8.7–10.3)
Chloride: 107 mmol/L — ABNORMAL HIGH (ref 96–106)
Creatinine, Ser: 1.44 mg/dL — ABNORMAL HIGH (ref 0.57–1.00)
GFR calc Af Amer: 42 mL/min/{1.73_m2} — ABNORMAL LOW (ref >59)
GFR calc non Af Amer: 36 mL/min/{1.73_m2} — ABNORMAL LOW (ref >59)
Glucose: 185 mg/dL — ABNORMAL HIGH (ref 65–99)
Potassium: 4.8 mmol/L (ref 3.5–5.2)
Sodium: 142 mmol/L (ref 134–144)

## 2020-04-01 NOTE — Progress Notes (Signed)
PCP: Luciano Cutter, MD Primary Cardiologist: Dr Aundra Dubin Primary EP: Dr Rayann Heman  [name] is a 75 y.o. female who presents today for routine electrophysiology followup.  Since last being seen in our clinic, the patient reports doing very well.  Today, she denies symptoms of palpitations, chest pain, shortness of breath,  lower extremity edema, dizziness, presyncope, syncope, or ICD shocks.  The patient is otherwise without complaint today.   Past Medical History:  Diagnosis Date  . Allergy   . Arthritis   . Atrial fibrillation ([place])    controlled with amiodarone, on coumadin  . Chronic renal insufficiency   . Chronic systolic heart failure ([place])   . Congestive heart failure ([place])   . Coronary artery disease 01/31/2011  . Diabetes mellitus    type 1  . 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   . History of chicken pox   . Hyperlipidemia   . Hypertension   . Ischemic cardiomyopathy   . Lumbar spondylosis 01/11/2012  . Sleep apnea   . Stroke [facility]) 2010   eye doctor said she had TIA  . Ventricular tachycardia ([place])    Polymorphic   Past Surgical History:  Procedure Laterality Date  . ABDOMINAL HYSTERECTOMY  1995  . CARDIAC DEFIBRILLATOR PLACEMENT    . CHOLECYSTECTOMY  1985  . COLONOSCOPY WITH PROPOFOL N/A 02/24/2018   Procedure: COLONOSCOPY WITH PROPOFOL;  Surgeon: Milus Banister, MD;  Location: [facility] ENDOSCOPY;  Service: Endoscopy;  Laterality: N/A;  . ICD  2003/2007   implanted by Dr Rollene Fare, most recent generator change 2/13 by Dr Rayann Heman, Analyze ST study patient  . IMPLANTABLE CARDIOVERTER DEFIBRILLATOR (ICD) GENERATOR CHANGE N/A 05/05/2011   Procedure: ICD GENERATOR CHANGE;  Surgeon: Thompson Grayer, MD;  Location: [facility] CATH LAB;  Service: Cardiovascular;  Laterality: N/A;  . LEFT HEART CATHETERIZATION WITH CORONARY ANGIOGRAM N/A 01/30/2011   Procedure: LEFT HEART CATHETERIZATION WITH CORONARY ANGIOGRAM;  Surgeon: Larey Dresser, MD;  Location: [facility] CATH LAB;   Service: Cardiovascular;  Laterality: N/A;  . POLYPECTOMY  02/24/2018   Procedure: POLYPECTOMY;  Surgeon: Milus Banister, MD;  Location: [facility] ENDOSCOPY;  Service: Endoscopy;;  . [place] are reviewed and negative except as per HPI above  Current Outpatient Medications  Medication Sig Dispense Refill  . acetaminophen (TYLENOL) 500 MG tablet Take 500 mg by mouth daily as needed for moderate pain or headache.    [name] amiodarone (PACERONE) 100 MG tablet TAKE 1 TABLET BY MOUTH EVERY DAY 90 tablet 3  . Blood Glucose Monitoring Suppl (ACCU-CHEK NANO SMARTVIEW) W/DEVICE KIT Use to test blood sugar 4 times daily as instructed. Dx code: E11.59 1 kit 0  . carvedilol (COREG) 25 MG tablet TAKE 1 TABLET BY MOUTH 2 (TWO) TIMES DAILY WITH A MEAL. 180 tablet 3  . Cholecalciferol (VITAMIN D3) 2000 UNITS TABS Take 2,000 Units by mouth daily.    . dapagliflozin propanediol (FARXIGA) 10 MG TABS tablet Take 1 tablet (10 mg total) by mouth daily before breakfast. 30 tablet 5  . dicyclomine (BENTYL) 10 MG capsule TAKE 1 CAPSULE (10 MG TOTAL) BY MOUTH 4 (FOUR) TIMES DAILY BEFORE MEALS AND AT BEDTIME. 120 capsule 1  . ELIQUIS 5 MG TABS tablet TAKE 1 TABLET BY MOUTH TWICE A DAY 60 tablet 11  . furosemide (LASIX) 20 MG tablet Take 20 mg by mouth daily.    [name] gabapentin (NEURONTIN) 400 MG capsule TAKE 1 CAPSULE (400 MG TOTAL) BY MOUTH  2 (TWO) TIMES DAILY. 180 capsule 1  . glucose blood (ACCU-CHEK SMARTVIEW) test strip Use to test blood sugar 4 times daily as instructed. Dx code E11.59 200 each 11  . insulin aspart (NOVOLOG) 100 UNIT/ML injection Inject 10-16 Units into the skin 3 (three) times daily with meals. 37.8 mL 2  . insulin detemir (LEVEMIR FLEXTOUCH) 100 UNIT/ML FlexPen INJECT 40 UNITS INTO THE SKIN DAILY AT 10 PM. 15 mL 1  . levothyroxine (SYNTHROID) 25 MCG tablet TAKE 1 TABLET BY MOUTH EVERY MORNING BEFORE BREAKFAST 30 tablet 0  . magnesium oxide (MAG-OX) 400 MG tablet TAKE 1 TABLET BY  MOUTH TWICE A DAY 180 tablet 0  . metFORMIN (GLUCOPHAGE-XR) 500 MG 24 hr tablet Take 2 tablets (1,000 mg total) by mouth daily with supper. 180 tablet 3  . Multiple Vitamins-Minerals (EYE VITAMINS PO) Take 1 tablet by mouth 2 (two) times daily.     . nitroGLYCERIN (NITROSTAT) 0.4 MG SL tablet Place 1 tablet (0.4 mg total) under the tongue every 5 (five) minutes as needed for chest pain. 25 tablet 1  . omeprazole (PRILOSEC) 20 MG capsule TAKE 1 CAPSULE BY MOUTH EVERY DAY 90 capsule 1  . rosuvastatin (CRESTOR) 40 MG tablet TAKE 1 TABLET BY MOUTH EVERY DAY 90 tablet 3  . sacubitril-valsartan (ENTRESTO) 49-51 MG Take 1 tablet by mouth 2 (two) times daily. 60 tablet 6  . traMADol (ULTRAM) 50 MG tablet TAKE 1 TABLET BY MOUTH EVERY 6 HOURS AS NEEDED FOR PAIN 120 tablet 2  . fluticasone (FLONASE) 50 MCG/ACT nasal spray Place into the nose.     No current facility-administered medications for this visit.    Physical Exam: Vitals:   04/01/20 1057  BP: 132/66  Pulse: 65  SpO2: 96%  Weight: 203 lb (92.1 kg)  Height: $Remove'5\' 3"'AeQxatX$  (1.6 m)    GEN- The patient is well appearing, alert and oriented x 3 today.   Head- normocephalic, atraumatic Eyes-  Sclera clear, conjunctiva pink Ears- hearing intact Oropharynx- clear Lungs- Clear to ausculation bilaterally, normal work of breathing Chest- ICD pocket is well healed Heart- Regular rate and rhythm, no murmurs, rubs or gallops, PMI not laterally displaced GI- soft, NT, ND, + BS Extremities- no clubbing, cyanosis, or edema  ICD interrogation- reviewed in detail today,  See PACEART report  ekg tracing ordered today is personally reviewed and shows sinus, QRS < 120 msec  Wt Readings from Last 3 Encounters:  04/01/20 203 lb (92.1 kg)  09/29/19 202 lb (91.6 kg)  06/27/19 210 lb 12.8 oz (95.6 kg)    Assessment and Plan:  1.  Chronic systolic dysfunction euvolemic today Stable on an appropriate medical regimen Normal ICD function See Pace Art  report No changes today she is not device dependant today  She has reached ERI Risks, benefits, and alternatives to ICD pulse generator replacement were discussed in detail today.  The patient understands that risks include but are not limited to bleeding, infection, pneumothorax, perforation, tamponade, vascular damage, renal failure, MI, stroke, death, inappropriate shocks, damage to his existing leads, and lead dislodgement and wishes to proceed.  We will therefore schedule the procedure at the next available time. Hold eliquis 24 hours prior to gen change She has a SJM 0017 Riata lead.  We will plan to perform fluoroscopy at time of her gen change.  2. VT Well controlled on low dose amiodarone Requires close follow-up on this medicine to avoid toxicity  3. afib Well controlled On eliquis for chads2vasc  score of 8  Risks, benefits and potential toxicities for medications prescribed and/or refilled reviewed with patient today.   Thompson Grayer MD, [facility] 04/01/2020 11:24 AM

## 2020-04-01 NOTE — Patient Instructions (Addendum)
Medication Instructions:  Your physician recommends that you continue on your current medications as directed. Please refer to the Current Medication list given to you today.  Labwork: CBC, BMP  Testing/Procedures: None ordered.  Remote monitoring is used to monitor your ICD from home. This monitoring reduces the number of office visits required to check your device to one time per year. It allows [name] to keep an eye on the functioning of your device to ensure it is working properly. You are scheduled for a device check from home on 06/19/20. You may send your transmission at any time that day. If you have a wireless device, the transmission will be sent automatically. After your physician reviews your transmission, you will receive a postcard with your next transmission date.  Any Other Special Instructions Will Be Listed Below (If Applicable).  If you need a refill on your cardiac medications before your next appointment, please call your pharmacy.     Cardioverter Defibrillator Implantation An implantable cardioverter defibrillator (ICD) is a device that identifies and corrects abnormal heart rhythms. Cardioverter defibrillator implantation is a surgery to place an ICD under the skin in the chest or abdomen. An ICD has a battery, a small computer (pulse generator), and wires (leads) that go into the heart. The ICD detects and corrects two types of dangerous irregular heart rhythms (arrhythmias):  A rapid heart rhythm in the lower chambers of the heart (ventricles). This is called ventricular tachycardia.  The ventricles contracting in an uncoordinated way. This is called ventricular fibrillation. There are different types of ICDs, and the electrical signals from the ICD can be programmed differently based on the condition being treated. The electrical signals from the ICD can be low-energy pulses, high-energy shocks, or a combination of the two. The low-energy pulses are generally used to  restore the heartbeat to normal when it is either too slow (bradycardia) or too fast. These pulses are painless. The high-energy shocks are used to treat abnormal rhythms such as ventricular tachycardia or ventricular fibrillation. This shock may feel like a strong jolt in the chest. Your health care provider may recommend an ICD if you have:  Had a ventricular arrhythmia in the past.  A damaged heart because of a disease or heart condition.  A weakened heart muscle from a heart attack or cardiac arrest.  A congenital heart defect.  Long QT syndrome, which is a disorder of the heart's electrical system.  Brugada syndrome, which is a condition that causes a disruption of the heart's normal rhythm. Tell a health care provider about:  Any allergies you have.  All medicines you are taking, including vitamins, herbs, eye drops, creams, and over-the-counter medicines.  Any problems you or family members have had with anesthetic medicines.  Any blood disorders you have.  Any surgeries you have had.  Any medical conditions you have.  Whether you are pregnant or may be pregnant. What are the risks? Generally, this is a safe procedure. However, problems may occur, including:  Infection.  Bleeding.  Allergic reactions to medicines used during the procedure.  Blood clots.  Swelling or bruising.  Damage to nearby structures or organs, such as nerves, lungs, blood vessels, or the heart where the ICD leads or pulse generator is implanted. What happens before the procedure? Staying hydrated Follow instructions from your health care provider about hydration, which may include:  Up to 2 hours before the procedure - you may continue to drink clear liquids, such as water, clear fruit juice, black  coffee, and plain tea.   Eating and drinking restrictions Follow instructions from your health care provider about eating and drinking, which may include:  8 hours before the procedure - stop  eating heavy meals or foods, such as meat, fried foods, or fatty foods.  6 hours before the procedure - stop eating light meals or foods, such as toast or cereal.  6 hours before the procedure - stop drinking milk or drinks that contain milk.  2 hours before the procedure - stop drinking clear liquids. Medicines Ask your health care provider about:  Changing or stopping your regular medicines. This is especially important if you are taking diabetes medicines or blood thinners.  Taking medicines such as aspirin and ibuprofen. These medicines can thin your blood. Do not take these medicines unless your health care provider tells you to take them.  Taking over-the-counter medicines, vitamins, herbs, and supplements. Tests You may have an exam or testing. These may include:  Blood tests.  A test to check the electrical signals in your heart (electrocardiogram, ECG).  Imaging tests, such as a chest X-ray.  Echocardiogram. This is an ultrasound of your heart to evaluate your heart structures and function.  An event monitor or Holter monitor to wear at home. General instructions  Do not use any products that contain nicotine or tobacco for at least 4 weeks before the procedure. These products include cigarettes, chewing tobacco, and vaping devices, such as e-cigarettes. If you need help quitting, ask your health care provider.  Ask your health care provider: ? How your procedure site will be marked. ? What steps will be taken to help prevent infection. These may include:  Removing hair at the surgery site.  Washing skin with a germ-killing soap.  Taking antibiotic medicine.  You may be asked to shower with a germ-killing soap.  Plan to have a responsible adult take you home from the hospital or clinic. What happens during the procedure?  Small monitors will be put on your body. They will be used to check your heart rate, blood pressure, and oxygen level.  A pair of sticky  pads (defibrillator pads) may be placed on your back and chest. These pads are able to pace your heart as needed during the procedure.  An IV will be inserted into one of your veins.  You will be given one or more of the following: ? A medicine to help you relax (sedative). ? A medicine to numb the area (local anesthetic). ? A medicine to make you fall asleep(general anesthetic).  A small incision will be made to create a deep pocket under the skin of your chest or abdomen.  Leads will be guided through a blood vessel into your heart and attached to your heart muscles. Depending on the ICD, the leads may go into one ventricle, or they may go into both ventricles and into an upper chamber of the heart. An X-ray machine (fluoroscope) will be used to help guide the leads. The other end of the leads will be attached to the pulse generator.  The pulse generator will be placed into the pocket under the skin.  The ICD will be tested, and your health care provider will program the ICD for the condition being treated.  The incision will be closed with stitches (sutures), skin glue, adhesive strips, or staples.  A bandage (dressing) will be placed over the incision. The procedure may vary among health care providers and hospitals.   What happens after the procedure?  Your blood pressure, heart rate, breathing rate, and blood oxygen level will be monitored until you leave the hospital or clinic. Your health care provider will also monitor your ICD to make sure it is working properly.  A chest X-ray will be taken to check that the ICD is in the right place.  Do not raise the arm on the side of your procedure higher than your shoulder for as long as told by your health care provider. This is usually at least 6 weeks.  You may be given an identification card explaining that you have an ICD.  You will be given a remote home monitoring device to use with your ICD to allow your device to communicate  with your clinic. Summary  An implantable cardioverter defibrillator (ICD) is a device that identifies and corrects abnormal heart rhythms. Cardioverter defibrillator implantation is a surgery to place an ICD under the skin in the chest or abdomen.  An ICD consists of a battery, a small computer (pulse generator), and wires (leads) that go into the heart.  During the procedure, the ICD will be tested, and your health care provider will program the ICD for the condition being treated.  After the procedure, a chest X-ray will be taken to check that the ICD is in the right place. This information is not intended to replace advice given to you by your health care provider. Make sure you discuss any questions you have with your health care provider. Document Revised: 09/06/2019 Document Reviewed: 09/06/2019 Elsevier Patient Education  [place].

## 2020-04-01 NOTE — H&P (View-Only) (Signed)
PCP: Luciano Cutter, MD Primary Cardiologist: Dr Aundra Dubin Primary EP: Dr Rayann Heman  [name] is a 75 y.o. female who presents today for routine electrophysiology followup.  Since last being seen in our clinic, the patient reports doing very well.  Today, she denies symptoms of palpitations, chest pain, shortness of breath,  lower extremity edema, dizziness, presyncope, syncope, or ICD shocks.  The patient is otherwise without complaint today.   Past Medical History:  Diagnosis Date  . Allergy   . Arthritis   . Atrial fibrillation ([place])    controlled with amiodarone, on coumadin  . Chronic renal insufficiency   . Chronic systolic heart failure ([place])   . Congestive heart failure ([place])   . Coronary artery disease 01/31/2011  . Diabetes mellitus    type 1  . 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   . History of chicken pox   . Hyperlipidemia   . Hypertension   . Ischemic cardiomyopathy   . Lumbar spondylosis 01/11/2012  . Sleep apnea   . Stroke [facility]) 2010   eye doctor said she had TIA  . Ventricular tachycardia ([place])    Polymorphic   Past Surgical History:  Procedure Laterality Date  . ABDOMINAL HYSTERECTOMY  1995  . CARDIAC DEFIBRILLATOR PLACEMENT    . CHOLECYSTECTOMY  1985  . COLONOSCOPY WITH PROPOFOL N/A 02/24/2018   Procedure: COLONOSCOPY WITH PROPOFOL;  Surgeon: Milus Banister, MD;  Location: [facility] END[facility]COPY;  Service: Endoscopy;  Laterality: N/A;  . ICD  2003/2007   implanted by Dr Rollene Fare, most recent generator change 2/13 by Dr Rayann Heman, Analyze ST study patient  . IMPLANTABLE CARDIOVERTER DEFIBRILLATOR (ICD) GENERATOR CHANGE N/A 05/05/2011   Procedure: ICD GENERATOR CHANGE;  Surgeon: Thompson Grayer, MD;  Location: [facility] CATH LAB;  Service: Cardiovascular;  Laterality: N/A;  . LEFT HEART CATHETERIZATION WITH CORONARY ANGIOGRAM N/A 01/30/2011   Procedure: LEFT HEART CATHETERIZATION WITH CORONARY ANGIOGRAM;  Surgeon: Larey Dresser, MD;  Location: [facility] CATH LAB;   Service: Cardiovascular;  Laterality: N/A;  . POLYPECTOMY  02/24/2018   Procedure: POLYPECTOMY;  Surgeon: Milus Banister, MD;  Location: [facility] ENDOSCOPY;  Service: Endoscopy;;  . [place] are reviewed and negative except as per HPI above  Current Outpatient Medications  Medication Sig Dispense Refill  . acetaminophen (TYLENOL) 500 MG tablet Take 500 mg by mouth daily as needed for moderate pain or headache.    [name] amiodarone (PACERONE) 100 MG tablet TAKE 1 TABLET BY MOUTH EVERY DAY 90 tablet 3  . Blood Glucose Monitoring Suppl (ACCU-CHEK NANO SMARTVIEW) W/DEVICE KIT Use to test blood sugar 4 times daily as instructed. Dx code: E11.59 1 kit 0  . carvedilol (COREG) 25 MG tablet TAKE 1 TABLET BY MOUTH 2 (TWO) TIMES DAILY WITH A MEAL. 180 tablet 3  . Cholecalciferol (VITAMIN D3) 2000 UNITS TABS Take 2,000 Units by mouth daily.    . dapagliflozin propanediol (FARXIGA) 10 MG TABS tablet Take 1 tablet (10 mg total) by mouth daily before breakfast. 30 tablet 5  . dicyclomine (BENTYL) 10 MG capsule TAKE 1 CAPSULE (10 MG TOTAL) BY MOUTH 4 (FOUR) TIMES DAILY BEFORE MEALS AND AT BEDTIME. 120 capsule 1  . ELIQUIS 5 MG TABS tablet TAKE 1 TABLET BY MOUTH TWICE A DAY 60 tablet 11  . furosemide (LASIX) 20 MG tablet Take 20 mg by mouth daily.    [name] gabapentin (NEURONTIN) 400 MG capsule TAKE 1 CAPSULE (400 MG TOTAL) BY MOUTH  2 (TWO) TIMES DAILY. 180 capsule 1  . glucose blood (ACCU-CHEK SMARTVIEW) test strip Use to test blood sugar 4 times daily as instructed. Dx code E11.59 200 each 11  . insulin aspart (NOVOLOG) 100 UNIT/ML injection Inject 10-16 Units into the skin 3 (three) times daily with meals. 37.8 mL 2  . insulin detemir (LEVEMIR FLEXTOUCH) 100 UNIT/ML FlexPen INJECT 40 UNITS INTO THE SKIN DAILY AT 10 PM. 15 mL 1  . levothyroxine (SYNTHROID) 25 MCG tablet TAKE 1 TABLET BY MOUTH EVERY MORNING BEFORE BREAKFAST 30 tablet 0  . magnesium oxide (MAG-OX) 400 MG tablet TAKE 1 TABLET BY  MOUTH TWICE A DAY 180 tablet 0  . metFORMIN (GLUCOPHAGE-XR) 500 MG 24 hr tablet Take 2 tablets (1,000 mg total) by mouth daily with supper. 180 tablet 3  . Multiple Vitamins-Minerals (EYE VITAMINS PO) Take 1 tablet by mouth 2 (two) times daily.     . nitroGLYCERIN (NITROSTAT) 0.4 MG SL tablet Place 1 tablet (0.4 mg total) under the tongue every 5 (five) minutes as needed for chest pain. 25 tablet 1  . omeprazole (PRILOSEC) 20 MG capsule TAKE 1 CAPSULE BY MOUTH EVERY DAY 90 capsule 1  . rosuvastatin (CRESTOR) 40 MG tablet TAKE 1 TABLET BY MOUTH EVERY DAY 90 tablet 3  . sacubitril-valsartan (ENTRESTO) 49-51 MG Take 1 tablet by mouth 2 (two) times daily. 60 tablet 6  . traMADol (ULTRAM) 50 MG tablet TAKE 1 TABLET BY MOUTH EVERY 6 HOURS AS NEEDED FOR PAIN 120 tablet 2  . fluticasone (FLONASE) 50 MCG/ACT nasal spray Place into the nose.     No current facility-administered medications for this visit.    Physical Exam: Vitals:   04/01/20 1057  BP: 132/66  Pulse: 65  SpO2: 96%  Weight: 203 lb (92.1 kg)  Height: $Remove'5\' 3"'ATSGsRL$  (1.6 m)    GEN- The patient is well appearing, alert and oriented x 3 today.   Head- normocephalic, atraumatic Eyes-  Sclera clear, conjunctiva pink Ears- hearing intact Oropharynx- clear Lungs- Clear to ausculation bilaterally, normal work of breathing Chest- ICD pocket is well healed Heart- Regular rate and rhythm, no murmurs, rubs or gallops, PMI not laterally displaced GI- soft, NT, ND, + BS Extremities- no clubbing, cyanosis, or edema  ICD interrogation- reviewed in detail today,  See PACEART report  ekg tracing ordered today is personally reviewed and shows sinus, QRS < 120 msec  Wt Readings from Last 3 Encounters:  04/01/20 203 lb (92.1 kg)  09/29/19 202 lb (91.6 kg)  06/27/19 210 lb 12.8 oz (95.6 kg)    Assessment and Plan:  1.  Chronic systolic dysfunction euvolemic today Stable on an appropriate medical regimen Normal ICD function See Pace Art  report No changes today she is not device dependant today  She has reached ERI Risks, benefits, and alternatives to ICD pulse generator replacement were discussed in detail today.  The patient understands that risks include but are not limited to bleeding, infection, pneumothorax, perforation, tamponade, vascular damage, renal failure, MI, stroke, death, inappropriate shocks, damage to his existing leads, and lead dislodgement and wishes to proceed.  We will therefore schedule the procedure at the next available time. Hold eliquis 24 hours prior to gen change She has a SJM 8119 Riata lead.  We will plan to perform fluoroscopy at time of her gen change.  2. VT Well controlled on low dose amiodarone Requires close follow-up on this medicine to avoid toxicity  3. afib Well controlled On eliquis for chads2vasc  score of 8  Risks, benefits and potential toxicities for medications prescribed and/or refilled reviewed with patient today.   Thompson Grayer MD, [facility] 04/01/2020 11:24 AM

## 2020-04-03 NOTE — Progress Notes (Signed)
Remote ICD transmission.   

## 2020-04-09 ENCOUNTER — Other Ambulatory Visit (HOSPITAL_COMMUNITY)
Admission: RE | Admit: 2020-04-09 | Discharge: 2020-04-09 | Disposition: A | Discharge status: Home / Self Care | Payer: Medicare PPO | Source: Ambulatory Visit | Attending: Internal Medicine | Admitting: Internal Medicine

## 2020-04-09 DIAGNOSIS — Z01812 Encounter for preprocedural laboratory examination: Secondary | ICD-10-CM | POA: Diagnosis present

## 2020-04-09 DIAGNOSIS — Z20822 Contact with and (suspected) exposure to covid-19: Secondary | ICD-10-CM | POA: Diagnosis not present

## 2020-04-09 LAB — SARS CORONAVIRUS 2 (TAT 6-24 HRS): SARS Coronavirus 2: NEGATIVE

## 2020-04-10 ENCOUNTER — Telehealth (HOSPITAL_COMMUNITY): Payer: Self-pay | Admitting: *Deleted

## 2020-04-10 NOTE — Telephone Encounter (Signed)
Pt left a VM requesting a call back about medication our office put her on. I called back to get more information no answer/no vm.

## 2020-04-11 ENCOUNTER — Other Ambulatory Visit: Payer: Self-pay

## 2020-04-11 ENCOUNTER — Ambulatory Visit (HOSPITAL_COMMUNITY)
Admission: RE | Admit: 2020-04-11 | Discharge: 2020-04-11 | Disposition: A | Discharge status: Home / Self Care | Payer: Medicare PPO | Attending: Internal Medicine | Admitting: Internal Medicine

## 2020-04-11 ENCOUNTER — Ambulatory Visit (HOSPITAL_COMMUNITY)
Admission: RE | Disposition: A | Discharge status: Home / Self Care | Payer: Self-pay | Source: Home / Self Care | Attending: Internal Medicine

## 2020-04-11 DIAGNOSIS — I495 Sick sinus syndrome: Secondary | ICD-10-CM | POA: Insufficient documentation

## 2020-04-11 DIAGNOSIS — Z7901 Long term (current) use of anticoagulants: Secondary | ICD-10-CM | POA: Diagnosis not present

## 2020-04-11 DIAGNOSIS — Z79899 Other long term (current) drug therapy: Secondary | ICD-10-CM | POA: Insufficient documentation

## 2020-04-11 DIAGNOSIS — I2510 Atherosclerotic heart disease of native coronary artery without angina pectoris: Secondary | ICD-10-CM | POA: Diagnosis not present

## 2020-04-11 DIAGNOSIS — I255 Ischemic cardiomyopathy: Secondary | ICD-10-CM | POA: Insufficient documentation

## 2020-04-11 DIAGNOSIS — Z794 Long term (current) use of insulin: Secondary | ICD-10-CM | POA: Diagnosis not present

## 2020-04-11 DIAGNOSIS — Z4502 Encounter for adjustment and management of automatic implantable cardiac defibrillator: Secondary | ICD-10-CM

## 2020-04-11 DIAGNOSIS — I480 Paroxysmal atrial fibrillation: Secondary | ICD-10-CM | POA: Insufficient documentation

## 2020-04-11 DIAGNOSIS — Z79890 Hormone replacement therapy (postmenopausal): Secondary | ICD-10-CM | POA: Insufficient documentation

## 2020-04-11 DIAGNOSIS — I472 Ventricular tachycardia: Secondary | ICD-10-CM | POA: Diagnosis not present

## 2020-04-11 HISTORY — PX: ICD GENERATOR CHANGEOUT: EP1231

## 2020-04-11 LAB — GLUCOSE, CAPILLARY: Glucose-Capillary: 305 mg/dL — ABNORMAL HIGH (ref 70–99)

## 2020-04-11 SURGERY — ICD GENERATOR CHANGEOUT

## 2020-04-11 MED ORDER — LIDOCAINE HCL 1 % IJ SOLN
INTRAMUSCULAR | Status: AC
  Filled 2020-04-11: qty 20

## 2020-04-11 MED ORDER — SODIUM CHLORIDE 0.9 % IV SOLN: INTRAVENOUS | Status: DC

## 2020-04-11 MED ORDER — FENTANYL CITRATE (PF) 100 MCG/2ML IJ SOLN
INTRAMUSCULAR | Status: AC
  Filled 2020-04-11: qty 2

## 2020-04-11 MED ORDER — MIDAZOLAM HCL 5 MG/5ML IJ SOLN
INTRAMUSCULAR | Status: AC
  Filled 2020-04-11: qty 5

## 2020-04-11 MED ORDER — SODIUM CHLORIDE 0.9% FLUSH: 3.0000 mL | Freq: Two times a day (BID) | INTRAVENOUS | Status: DC

## 2020-04-11 MED ORDER — SODIUM CHLORIDE 0.9 % IV SOLN: 250.0000 mL | INTRAVENOUS | Status: DC | PRN

## 2020-04-11 MED ORDER — MIDAZOLAM HCL 5 MG/5ML IJ SOLN
INTRAMUSCULAR | Status: DC | PRN
  Administered 2020-04-11: 1 mg via INTRAVENOUS

## 2020-04-11 MED ORDER — ONDANSETRON HCL 4 MG/2ML IJ SOLN: 4.0000 mg | Freq: Four times a day (QID) | INTRAMUSCULAR | Status: DC | PRN

## 2020-04-11 MED ORDER — CEFAZOLIN SODIUM-DEXTROSE 2-4 GM/100ML-% IV SOLN
2.0000 g | INTRAVENOUS | Status: AC
  Administered 2020-04-11: 2 g via INTRAVENOUS

## 2020-04-11 MED ORDER — FENTANYL CITRATE (PF) 100 MCG/2ML IJ SOLN
INTRAMUSCULAR | Status: DC | PRN
  Administered 2020-04-11: 12.5 ug via INTRAVENOUS

## 2020-04-11 MED ORDER — LIDOCAINE HCL (PF) 1 % IJ SOLN
INTRAMUSCULAR | Status: DC | PRN
  Administered 2020-04-11: 45 mL

## 2020-04-11 MED ORDER — CEFAZOLIN SODIUM-DEXTROSE 2-4 GM/100ML-% IV SOLN
INTRAVENOUS | Status: AC
  Filled 2020-04-11: qty 100

## 2020-04-11 MED ORDER — SODIUM CHLORIDE 0.9 % IV SOLN
80.0000 mg | INTRAVENOUS | Status: AC
  Administered 2020-04-11: 80 mg

## 2020-04-11 MED ORDER — SODIUM CHLORIDE 0.9 % IV SOLN
INTRAVENOUS | Status: AC
  Filled 2020-04-11: qty 2

## 2020-04-11 MED ORDER — POVIDONE-IODINE 10 % EX SWAB: 2.0000 "application " | Freq: Once | CUTANEOUS | Status: DC

## 2020-04-11 MED ORDER — ACETAMINOPHEN 325 MG PO TABS: 325.0000 mg | ORAL_TABLET | ORAL | Status: DC | PRN

## 2020-04-11 MED ORDER — LIDOCAINE HCL 1 % IJ SOLN
INTRAMUSCULAR | Status: AC
  Filled 2020-04-11: qty 60

## 2020-04-11 MED ORDER — SODIUM CHLORIDE 0.9% FLUSH: 3.0000 mL | INTRAVENOUS | Status: DC | PRN

## 2020-04-11 SURGICAL SUPPLY — 7 items
CABLE SURGICAL S-101-97-12 (CABLE) ×2 IMPLANT
ICD ASSURA DR CD2357-40C (ICD Generator) ×1 IMPLANT
MAT PREVALON FULL STRYKER (MISCELLANEOUS) ×1 IMPLANT
PAD PRO RADIOLUCENT 2001M-C (PAD) ×2 IMPLANT
POUCH AIGIS-R ANTIBACT ICD (Mesh General) ×2 IMPLANT
POUCH AIGIS-R ANTIBACT ICD LRG (Mesh General) IMPLANT
TRAY PACEMAKER INSERTION (PACKS) ×2 IMPLANT

## 2020-04-11 NOTE — Interval H&P Note (Signed)
History and Physical Interval Note:  04/11/2020 1:29 PM  [name]  has presented today for surgery, with the diagnosis of eri.  The various methods of treatment have been discussed with the patient and family. After consideration of risks, benefits and other options for treatment, the patient has consented to  Procedure(s): ICD [place] (N/A) as a surgical intervention.  The patient's history has been reviewed, patient examined, no change in status, stable for surgery.  I have reviewed the patient's chart and labs.  Questions were answered to the patient's satisfaction.    ICD Criteria  Current LVEF:35% with prior ICD, now 40%. Within 12 months prior to implant: Yes   Heart failure history: Yes, Class III  Cardiomyopathy history: Yes, Ischemic Cardiomyopathy - Prior MI.  Atrial Fibrillation/Atrial Flutter: Yes, Paroxysmal.  Ventricular tachycardia history: Yes, Hemodynamic instability present. 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  Cardiac arrest history: Yes, Ventricular Tachycardia.  History of syndromes with risk of sudden death: No.  Previous ICD: Yes, Reason for ICD:  Primary prevention.  Current ICD indication: Secondary  PPM indication: Yes. Pacing type: Atrial. Greater than 40% RV pacing requirement anticipated. Indication: Sick Sinus Syndrome  Class I or II Bradycardia indication present: Yes  Beta Blocker therapy for 3 or more months: Yes, prescribed.   Ace Inhibitor/ARB therapy for 3 or more months: Yes, prescribed.    I have seen [name] is a 75 y.o. female  who presents for ICD generator change for secondary prevention of sudden death.  The patient's chart has been reviewed and they meet criteria for ICD generator change.  She has a Riata 7040 lead which we will evaluate with fluoroscopy.  If her lead reveals wire extravasation then we will consider removal.  As the lead has functioned normally, we will plan to use the  lead if no lead wire extravasation.   I have had a thorough discussion with the patient reviewing options.  The patient has had opportunities to ask questions and have them answered. The patient and I have decided together through the [employer] to proceed with ICD generator change at this time.  Risks, benefits, alternatives to ICD generator change  were discussed in detail with the patient today. The patient  understands that the risks include but are not limited to bleeding, infection, pneumothorax, perforation, tamponade, vascular damage, renal failure, MI, stroke, death, inappropriate shocks, and lead dislodgement and wishes to proceed.    Thompson Grayer

## 2020-04-11 NOTE — Discharge Instructions (Signed)
Resume eliquis 04/12/20 with the PM dose.  Implantable Cardiac Device Battery Change, Care After  This sheet gives you information about how to care for yourself after your procedure. Your health care provider may also give you more specific instructions. If you have problems or questions, contact your health care provider. What can I expect after the procedure? After your procedure, it is common to have:  Pain or soreness at the site where the cardiac device was inserted.  Swelling at the site where the cardiac device was inserted.  You should received an information card for your new device in 4-8 weeks. Follow these instructions at home: Incision care   Keep the incision clean and dry. ? Do not take baths, swim, or use a hot tub until after your wound check.  ? Do not shower for at least 7 days, or as directed by your health care provider. ? Pat the area dry with a clean towel. Do not rub the area. This may cause bleeding.  Follow instructions from your health care provider about how to take care of your incision. Make sure you: ? Leave stitches (sutures), skin glue, or adhesive strips in place. These skin closures may need to stay in place for 2 weeks or longer. If adhesive strip edges start to loosen and curl up, you may trim the loose edges. Do not remove adhesive strips completely unless your health care provider tells you to do that.  Check your incision area every day for signs of infection. Check for: ? More redness, swelling, or pain. ? More fluid or blood. ? Warmth. ? Pus or a bad smell. Activity  Do not lift anything that is heavier than 10 lb (4.5 kg) until your health care provider says it is okay to do so.  For the first week, or as long as told by your health care provider: ? Avoid lifting your affected arm higher than your shoulder. ? After 1 week, Be gentle when you move your arms over your head. It is okay to raise your arm to comb your hair. ? Avoid strenuous  exercise.  Ask your health care provider when it is okay to: ? Resume your normal activities. ? Return to work or school. ? Resume sexual activity. Eating and drinking  Eat a heart-healthy diet. This should include plenty of fresh fruits and vegetables, whole grains, low-fat dairy products, and lean protein like chicken and fish.  Limit alcohol intake to no more than 1 drink a day for non-pregnant women and 2 drinks a day for men. One drink equals 12 oz of beer, 5 oz of wine, or 1 oz of hard liquor.  Check ingredients and nutrition facts on packaged foods and beverages. Avoid the following types of food: ? Food that is high in salt (sodium). ? Food that is high in saturated fat, like full-fat dairy or red meat. ? Food that is high in trans fat, like fried food. ? Food and drinks that are high in sugar. Lifestyle  Do not use any products that contain nicotine or tobacco, such as cigarettes and e-cigarettes. If you need help quitting, ask your health care provider.  Take steps to manage and control your weight.  Once cleared, get regular exercise. Aim for 150 minutes of moderate-intensity exercise (such as walking or yoga) or 75 minutes of vigorous exercise (such as running or swimming) each week.  Manage other health problems, such as diabetes or high blood pressure. Ask your health care provider how you  can manage these conditions. General instructions  Do not drive for 24 hours after your procedure if you were given a medicine to help you relax (sedative).  Take over-the-counter and prescription medicines only as told by your health care provider.  Avoid putting pressure on the area where the cardiac device was placed.  If you need an MRI after your cardiac device has been placed, be sure to tell the health care provider who orders the MRI that you have a cardiac device.  Avoid close and prolonged exposure to electrical devices that have strong magnetic fields. These  include: ? Cell phones. Avoid keeping them in a pocket near the cardiac device, and try using the ear opposite the cardiac device. ? MP3 players. ? Household appliances, like microwaves. ? Metal detectors. ? Electric generators. ? High-tension wires.  Keep all follow-up visits as directed by your health care provider. This is important. Contact a health care provider if:  You have pain at the incision site that is not relieved by over-the-counter or prescription medicines.  You have any of these around your incision site or coming from it: ? More redness, swelling, or pain. ? Fluid or blood. ? Warmth to the touch. ? Pus or a bad smell.  You have a fever.  You feel brief, occasional palpitations, light-headedness, or any symptoms that you think might be related to your heart. Get help right away if:  You experience chest pain that is different from the pain at the cardiac device site.  You develop a red streak that extends above or below the incision site.  You experience shortness of breath.  You have palpitations or an irregular heartbeat.  You have light-headedness that does not go away quickly.  You faint or have dizzy spells.  Your pulse suddenly drops or increases rapidly and does not return to normal.  You begin to gain weight and your legs and ankles swell. Summary  After your procedure, it is common to have pain, soreness, and some swelling where the cardiac device was inserted.  Make sure to keep your incision clean and dry. Follow instructions from your health care provider about how to take care of your incision.  Check your incision every day for signs of infection, such as more pain or swelling, pus or a bad smell, warmth, or leaking fluid and blood.  Avoid strenuous exercise and lifting your left arm higher than your shoulder for 2 weeks, or as long as told by your health care provider. This information is not intended to replace advice given to you by  your health care provider. Make sure you discuss any questions you have with your health care provider.

## 2020-04-12 ENCOUNTER — Encounter (HOSPITAL_COMMUNITY): Payer: Self-pay | Admitting: Internal Medicine

## 2020-04-12 MED FILL — Lidocaine HCl Local Inj 1%: INTRAMUSCULAR | Qty: 60 | Status: AC

## 2020-04-12 MED FILL — Lidocaine HCl Local Inj 1%: INTRAMUSCULAR | Qty: 20 | Status: AC

## 2020-04-21 ENCOUNTER — Other Ambulatory Visit (HOSPITAL_COMMUNITY): Payer: Self-pay | Admitting: Cardiology

## 2020-04-25 ENCOUNTER — Ambulatory Visit: Payer: Medicare PPO

## 2020-04-29 NOTE — Progress Notes (Deleted)
Cardiology Office Note Date:  04/29/2020  Patient ID:  [name], [name] Mar 03, 1946, MRN [id number] PCP:  Luciano Cutter, MD  Cardiologist:  Dr. Aundra Dubin EP: Dr. Rayann Heman     Chief Complaint:  *** wound check  History of Present Illness: [name] is a 75 y.o. female with history of CAD (s/p LAD and RCA PCI.  Last LHC in 11/12 with patent proximal LAD stent, ostial 70% D1 (jailed by stent), mild LAD stent patent, patent RCA stents), ICM, chronic CHF, VT, ICD, AFib, stroke, DM, HTN, HLD, CKD (III), gastroparesis, Hypothyroidism.  She last saw Dr. Aundra Dubin July 2021colume stable, SBP 90's with fatigue, entresto was reduced (noted as well she had not been able to tolerate spironolactone or eplerenone)  She comes in today to be seen for Dr. Rayann Heman, last seen by him Jan 2022, device had reached ERI, planned for fluoroscopy of her Riata lead at time of gen change.  Lead was intact and not replaced, underwent generator change alone.  *** wound *** eliquis, bleeding, labs, dose *** amio labs, pulm, eyes *** volume  Device information SJM dual chamber ICD, implanted 2003,  2007, gen changes 2013, Jan 2022 2003 had syncope >> EP study with inducible PMVT + h/o PMVT 2012 with ICD shocks  At the time of her gen change Jan 2022,  SJM Riata ST 7040 lead was fluoroscopically and electrically normal today, opted to use this lead rather than replacing it after a long discussion with the patient prior to the procedure.   AAD Hx 2007 amiodarone started quickly stopped 2/2 nausea Remotely as well tried on Sotalol and Multaq unclear when/why they were stopped 2012 restarted on amiodarone   Past Medical History:  Diagnosis Date  . Allergy   . Arthritis   . Atrial fibrillation ([place])    controlled with amiodarone, on coumadin  . Chronic renal insufficiency   . Chronic systolic heart failure ([place])   . Congestive heart failure ([place])   . Coronary artery disease 01/31/2011  . Diabetes  mellitus    type 1  . 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   . History of chicken pox   . Hyperlipidemia   . Hypertension   . Ischemic cardiomyopathy   . Lumbar spondylosis 01/11/2012  . Sleep apnea   . Stroke [facility]) 2010   eye doctor said she had TIA  . Ventricular tachycardia ([place])    Polymorphic    Past Surgical History:  Procedure Laterality Date  . ABDOMINAL HYSTERECTOMY  1995  . CARDIAC DEFIBRILLATOR PLACEMENT    . CHOLECYSTECTOMY  1985  . COLONOSCOPY WITH PROPOFOL N/A 02/24/2018   Procedure: COLONOSCOPY WITH PROPOFOL;  Surgeon: Milus Banister, MD;  Location: [facility] ENDOSCOPY;  Service: Endoscopy;  Laterality: N/A;  . ICD  2003/2007   implanted by Dr Rollene Fare, most recent generator change 2/13 by Dr Rayann Heman, Analyze ST study patient  . ICD GENERATOR CHANGEOUT N/A 04/11/2020   Procedure: ICD GENERATOR CHANGEOUT;  Surgeon: Thompson Grayer, MD;  Location: [facility];  Service: Cardiovascular;  Laterality: N/A;  . IMPLANTABLE CARDIOVERTER DEFIBRILLATOR (ICD) GENERATOR CHANGE N/A 05/05/2011   Procedure: ICD GENERATOR CHANGE;  Surgeon: Thompson Grayer, MD;  Location: [facility] CATH LAB;  Service: Cardiovascular;  Laterality: N/A;  . LEFT HEART CATHETERIZATION WITH CORONARY ANGIOGRAM N/A 01/30/2011   Procedure: LEFT HEART CATHETERIZATION WITH CORONARY ANGIOGRAM;  Surgeon: Larey Dresser, MD;  Location: [facility] CATH LAB;  Service: Cardiovascular;  Laterality: N/A;  . POLYPECTOMY  02/24/2018  Procedure: POLYPECTOMY;  Surgeon: Milus Banister, MD;  Location: [facility];  Service: Endoscopy;;  . Leta [facility]  1980    Current Outpatient Medications  Medication Sig Dispense Refill  . acetaminophen (TYLENOL) 500 MG tablet Take 500 mg by mouth daily as needed for moderate pain or headache.    [name] amiodarone (PACERONE) 100 MG tablet TAKE 1 TABLET BY MOUTH EVERY DAY 90 tablet 3  . Blood Glucose Monitoring Suppl (ACCU-CHEK NANO SMARTVIEW) W/DEVICE KIT Use to test blood sugar 4 times daily as  instructed. Dx code: E11.59 1 kit 0  . carvedilol (COREG) 25 MG tablet TAKE 1 TABLET BY MOUTH 2 (TWO) TIMES DAILY WITH A MEAL. 180 tablet 3  . Cholecalciferol (VITAMIN D3) 2000 UNITS TABS Take 2,000 Units by mouth daily.    . dapagliflozin propanediol (FARXIGA) 10 MG TABS tablet Take 1 tablet (10 mg total) by mouth daily before breakfast. 30 tablet 5  . dicyclomine (BENTYL) 10 MG capsule TAKE 1 CAPSULE (10 MG TOTAL) BY MOUTH 4 (FOUR) TIMES DAILY BEFORE MEALS AND AT BEDTIME. 120 capsule 1  . ELIQUIS 5 MG TABS tablet TAKE 1 TABLET BY MOUTH TWICE A DAY 60 tablet 11  . ENTRESTO 49-51 MG TAKE 1 TABLET BY MOUTH 2 (TWO) TIMES DAILY. 60 tablet 6  . fluticasone (FLONASE) 50 MCG/ACT nasal spray Place into the nose.    . furosemide (LASIX) 20 MG tablet Take 20 mg by mouth daily.    [name] gabapentin (NEURONTIN) 400 MG capsule TAKE 1 CAPSULE (400 MG TOTAL) BY MOUTH 2 (TWO) TIMES DAILY. 180 capsule 1  . glucose blood (ACCU-CHEK SMARTVIEW) test strip Use to test blood sugar 4 times daily as instructed. Dx code E11.59 200 each 11  . insulin aspart (NOVOLOG) 100 UNIT/ML injection Inject 10-16 Units into the skin 3 (three) times daily with meals. 37.8 mL 2  . insulin detemir (LEVEMIR FLEXTOUCH) 100 UNIT/ML FlexPen INJECT 40 UNITS INTO THE SKIN DAILY AT 10 PM. 15 mL 1  . levothyroxine (SYNTHROID) 25 MCG tablet TAKE 1 TABLET BY MOUTH EVERY MORNING BEFORE BREAKFAST 30 tablet 0  . magnesium oxide (MAG-OX) 400 MG tablet TAKE 1 TABLET BY MOUTH TWICE A DAY 180 tablet 0  . metFORMIN (GLUCOPHAGE-XR) 500 MG 24 hr tablet Take 2 tablets (1,000 mg total) by mouth daily with supper. 180 tablet 3  . Multiple Vitamins-Minerals (EYE VITAMINS PO) Take 1 tablet by mouth 2 (two) times daily.     . nitroGLYCERIN (NITROSTAT) 0.4 MG SL tablet Place 1 tablet (0.4 mg total) under the tongue every 5 (five) minutes as needed for chest pain. 25 tablet 1  . omeprazole (PRILOSEC) 20 MG capsule TAKE 1 CAPSULE BY MOUTH EVERY DAY 90 capsule 1  .  rosuvastatin (CRESTOR) 40 MG tablet TAKE 1 TABLET BY MOUTH EVERY DAY 90 tablet 3  . traMADol (ULTRAM) 50 MG tablet TAKE 1 TABLET BY MOUTH EVERY 6 HOURS AS NEEDED FOR PAIN 120 tablet 2   No current facility-administered medications for this visit.    Allergies:   Veltassa [patiromer], Darvon, Promethazine hcl, Spironolactone, and Plavix [clopidogrel bisulfate]   Social History:  The patient  reports that she quit smoking about 24 years ago. She has never used smokeless tobacco. She reports that she does not drink alcohol and does not use drugs.   Family History:  The patient's family history includes Breast cancer in her sister; Colon cancer in her maternal aunt; Diabetes in her mother; Early death (age of onset: 60) in  her brother; Heart attack in her father; Heart disease in her father and mother; Hyperlipidemia in her mother; Hypertension in her father and mother; Irritable bowel syndrome in her sister; Lung cancer in her sister.  ROS:  Please see the history of present illness. All other systems are reviewed and otherwise negative.   PHYSICAL EXAM:  VS:  There were no vitals taken for this visit. BMI: There is no height or weight on file to calculate BMI. Well nourished, well developed, in no acute distress  HEENT: normocephalic, atraumatic  Neck: no JVD, carotid bruits or masses Cardiac:  *** RRR; no significant murmurs, no rubs, or gallops Lungs:  *** CTA b/l, no wheezing, rhonchi or rales  Abd: soft, nontender MS: no deformity or atrophy Ext:  *** 1++ edema L>R Skin: warm and dry, no rash Neuro:  No gross deficits appreciated Psych: euthymic mood, full affect  *** ICD site is stable, no tethering or discomfort   EKG: not done today  ICD interrogation done today and reviewed by myself;  ***   06/27/2019: TTE IMPRESSIONS  1. Left ventricular ejection fraction, by estimation, is 40 to 45%. The  left ventricle has mildly decreased function. The left ventricle  demonstrates  regional wall motion abnormalities, infeiror and inferoseptal  hypokinesis. There is mild left  ventricular hypertrophy. Left ventricular diastolic parameters are  consistent with Grade I diastolic dysfunction (impaired relaxation).  2. Right ventricular systolic function is normal. The right ventricular  size is normal. Tricuspid regurgitation signal is inadequate for assessing  PA pressure.  3. Left atrial size was mildly dilated.  4. The mitral valve is normal in structure. No evidence of mitral valve  regurgitation. No evidence of mitral stenosis.  5. The aortic valve is tricuspid. Aortic valve regurgitation is mild. No  aortic stenosis is present.  6. The inferior vena cava is normal in size with greater than 50%  respiratory variability, suggesting right atrial pressure of 3 mmHg.    08/16/2018: TTE IMPRESSIONS  1. The left ventricle has a visually estimated ejection fraction of 40%.  The cavity size was normal. There is moderately increased left ventricular  wall thickness. Left ventricular diastolic Doppler parameters are  consistent with impaired relaxation.  Inferior and inferoseptal severe hypokinesis.  2. The right ventricle has normal systolic function. The cavity was  normal. There is no increase in right ventricular wall thickness.  3. Left atrial size was mildly dilated.  4. There is mild to moderate mitral annular calcification present. No  evidence of mitral valve stenosis. No significant regurgitation.  5. The aortic valve is tricuspid. Moderate calcification of the aortic  valve. Aortic valve regurgitation is mild by color flow Doppler. No  stenosis of the aortic valve.  6. The aortic root is normal in size and structure.  7. Normal IVC. No complete TR doppler jet so unable to estimate PA  systolic pressure.    Recent Labs: 09/29/2019: ALT 15 04/01/2020: BUN 22; Creatinine, Ser 1.44; Hemoglobin 12.5; Platelets 239; Potassium 4.8; Sodium 142   09/29/2019: Cholesterol 135; HDL 56; LDL Cholesterol 56; Total CHOL/HDL Ratio 2.4; Triglycerides 116; VLDL 23   CrCl cannot be calculated (Patient's most recent lab result is older than the maximum 21 days allowed.).   Wt Readings from Last 3 Encounters:  04/11/20 204 lb (92.5 kg)  04/01/20 203 lb (92.1 kg)  09/29/19 202 lb (91.6 kg)     Other studies reviewed: Additional studies/records reviewed today include: summarized above  ASSESSMENT AND  PLAN:  1. ICD     ***  2. ICM 3. Chronic CHF (systolic)     Has had some subsequent improvement in LVEF 40-45% by last year's echo     Follows closely with Dr. Aundra Dubin and the HF team  4. CAD      *** No symptoms of angina      Follows with Dr. Aundra Dubin  5. Paroxysmal Afib     CHA2DS2Vasc is 9, on Eliquis, *** appropriately dosed for age/weight     *** <1%  6. HTN     *** Looks good  7. HLD     *** managed with Dr. Eulis Manly  8. Hx of PMVT     *** On low dose amiodarone     *** Labs monitored via her PMD    Disposition: ***   Current medicines are reviewed at length with the patient today.  The patient did not have any concerns regarding medicines.  Venetia Night, PA-C 04/29/2020 9:24 AM     [place] [address] [phone] (office)  [phone] (fax)

## 2020-05-02 ENCOUNTER — Encounter: Payer: Medicare PPO | Admitting: Physician Assistant

## 2020-05-06 ENCOUNTER — Telehealth: Payer: Self-pay

## 2020-05-06 NOTE — Telephone Encounter (Signed)
-----   Message from Thompson Grayer, MD sent at 04/11/2020  3:19 PM EST ----- Please call and offer ICM clinic.  Her prior device did not have the technology.  I have not yet mentioned this to her.  She is very sweet and would be a good candidate.

## 2020-05-06 NOTE — Telephone Encounter (Addendum)
Patient referred to [facility] clinic by Dr Rayann Heman.  The device was changed on 04/11/2020 and now has capability to monitor Corvue Impedance but currently there is no data as off 04/20/2020.  Message sent to Tommye Standard, [place] to turn on Corvue at 05/17/2020 wound check office visit.  Will follow up with patient after Corvue data is recording.   Attempted ICM intro call to patient but no answer and no voice mail option.

## 2020-05-12 NOTE — Progress Notes (Deleted)
Cardiology Office Note Date:  05/12/2020  Patient ID:  [name], [name] [birth year], MRN [id number] PCP:  Luciano Cutter, MD  Cardiologist:  Dr. Aundra Dubin EP: Dr. Rayann Heman     Chief Complaint:  *** wound check  History of Present Illness: [name] is a 75 y.o. female with history of CAD (s/p LAD and RCA PCI.  Last LHC in 11/12 with patent proximal LAD stent, ostial 70% D1 (jailed by stent), mild LAD stent patent, patent RCA stents), ICM, chronic CHF, VT, ICD, AFib, stroke, DM, HTN, HLD, CKD (III), gastroparesis, Hypothyroidism.  She last saw Dr. Aundra Dubin July 2021colume stable, SBP 90's with fatigue, entresto was reduced (noted as well she had not been able to tolerate spironolactone or eplerenone)  She comes in today to be seen for Dr. Rayann Heman, last seen by him Jan 2022, device had reached ERI, planned for fluoroscopy of her Riata lead at time of gen change.  Lead was intact and not replaced, underwent generator change alone.  *** wound *** eliquis, bleeding, labs, dose *** amio labs, pulm, eyes *** volume *** turn on CorVue   Device information SJM dual chamber ICD, implanted 2003,  2007, gen changes 2013, Jan 2022 2003 had syncope >> EP study with inducible PMVT + h/o PMVT 2012 with ICD shocks  At the time of her gen change Jan 2022,  SJM Riata ST 7040 lead was fluoroscopically and electrically normal today, opted to use this lead rather than replacing it after a long discussion with the patient prior to the procedure.   AAD Hx 2007 amiodarone started quickly stopped 2/2 nausea Remotely as well tried on Sotalol and Multaq unclear when/why they were stopped 2012 restarted on amiodarone   Past Medical History:  Diagnosis Date  . Allergy   . Arthritis   . Atrial fibrillation ([place])    controlled with amiodarone, on coumadin  . Chronic renal insufficiency   . Chronic systolic heart failure ([place])   . Congestive heart failure ([place])   . Coronary artery disease  01/31/2011  . Diabetes mellitus    type 1  . 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   . History of chicken pox   . Hyperlipidemia   . Hypertension   . Ischemic cardiomyopathy   . Lumbar spondylosis 01/11/2012  . Sleep apnea   . Stroke [facility]) 2010   eye doctor said she had TIA  . Ventricular tachycardia ([place])    Polymorphic    Past Surgical History:  Procedure Laterality Date  . ABDOMINAL HYSTERECTOMY  1995  . CARDIAC DEFIBRILLATOR PLACEMENT    . CHOLECYSTECTOMY  1985  . COLONOSCOPY WITH PROPOFOL N/A 02/24/2018   Procedure: COLONOSCOPY WITH PROPOFOL;  Surgeon: Milus Banister, MD;  Location: [facility] ENDOSCOPY;  Service: Endoscopy;  Laterality: N/A;  . ICD  2003/2007   implanted by Dr Rollene Fare, most recent generator change 2/13 by Dr Rayann Heman, Analyze ST study patient  . ICD GENERATOR CHANGEOUT N/A 04/11/2020   Procedure: ICD GENERATOR CHANGEOUT;  Surgeon: Thompson Grayer, MD;  Location: [facility] CV LAB;  Service: Cardiovascular;  Laterality: N/A;  . IMPLANTABLE CARDIOVERTER DEFIBRILLATOR (ICD) GENERATOR CHANGE N/A 05/05/2011   Procedure: ICD GENERATOR CHANGE;  Surgeon: Thompson Grayer, MD;  Location: [facility] CATH LAB;  Service: Cardiovascular;  Laterality: N/A;  . LEFT HEART CATHETERIZATION WITH CORONARY ANGIOGRAM N/A 01/30/2011   Procedure: LEFT HEART CATHETERIZATION WITH CORONARY ANGIOGRAM;  Surgeon: Larey Dresser, MD;  Location[facility] CATH LAB;  Service: Cardiovascular;  Laterality: N/A;  .  POLYPECTOMY  02/24/2018   Procedure: POLYPECTOMY;  Surgeon: Milus Banister, MD;  Location: [facility];  Service: Endoscopy;;  . Leta Baptist  1980    Current Outpatient Medications  Medication Sig Dispense Refill  . acetaminophen (TYLENOL) 500 MG tablet Take 500 mg by mouth daily as needed for moderate pain or headache.    [name] amiodarone (PACERONE) 100 MG tablet TAKE 1 TABLET BY MOUTH EVERY DAY 90 tablet 3  . Blood Glucose Monitoring Suppl (ACCU-CHEK NANO SMARTVIEW) W/DEVICE KIT Use to test blood  sugar 4 times daily as instructed. Dx code: E11.59 1 kit 0  . carvedilol (COREG) 25 MG tablet TAKE 1 TABLET BY MOUTH 2 (TWO) TIMES DAILY WITH A MEAL. 180 tablet 3  . Cholecalciferol (VITAMIN D3) 2000 UNITS TABS Take 2,000 Units by mouth daily.    . dapagliflozin propanediol (FARXIGA) 10 MG TABS tablet Take 1 tablet (10 mg total) by mouth daily before breakfast. 30 tablet 5  . dicyclomine (BENTYL) 10 MG capsule TAKE 1 CAPSULE (10 MG TOTAL) BY MOUTH 4 (FOUR) TIMES DAILY BEFORE MEALS AND AT BEDTIME. 120 capsule 1  . ELIQUIS 5 MG TABS tablet TAKE 1 TABLET BY MOUTH TWICE A DAY 60 tablet 11  . ENTRESTO 49-51 MG TAKE 1 TABLET BY MOUTH 2 (TWO) TIMES DAILY. 60 tablet 6  . fluticasone (FLONASE) 50 MCG/ACT nasal spray Place into the nose.    . furosemide (LASIX) 20 MG tablet Take 20 mg by mouth daily.    [name] gabapentin (NEURONTIN) 400 MG capsule TAKE 1 CAPSULE (400 MG TOTAL) BY MOUTH 2 (TWO) TIMES DAILY. 180 capsule 1  . glucose blood (ACCU-CHEK SMARTVIEW) test strip Use to test blood sugar 4 times daily as instructed. Dx code E11.59 200 each 11  . insulin aspart (NOVOLOG) 100 UNIT/ML injection Inject 10-16 Units into the skin 3 (three) times daily with meals. 37.8 mL 2  . insulin detemir (LEVEMIR FLEXTOUCH) 100 UNIT/ML FlexPen INJECT 40 UNITS INTO THE SKIN DAILY AT 10 PM. 15 mL 1  . levothyroxine (SYNTHROID) 25 MCG tablet TAKE 1 TABLET BY MOUTH EVERY MORNING BEFORE BREAKFAST 30 tablet 0  . magnesium oxide (MAG-OX) 400 MG tablet TAKE 1 TABLET BY MOUTH TWICE A DAY 180 tablet 0  . metFORMIN (GLUCOPHAGE-XR) 500 MG 24 hr tablet Take 2 tablets (1,000 mg total) by mouth daily with supper. 180 tablet 3  . Multiple Vitamins-Minerals (EYE VITAMINS PO) Take 1 tablet by mouth 2 (two) times daily.     . nitroGLYCERIN (NITROSTAT) 0.4 MG SL tablet Place 1 tablet (0.4 mg total) under the tongue every 5 (five) minutes as needed for chest pain. 25 tablet 1  . omeprazole (PRILOSEC) 20 MG capsule TAKE 1 CAPSULE BY MOUTH EVERY DAY  90 capsule 1  . rosuvastatin (CRESTOR) 40 MG tablet TAKE 1 TABLET BY MOUTH EVERY DAY 90 tablet 3  . traMADol (ULTRAM) 50 MG tablet TAKE 1 TABLET BY MOUTH EVERY 6 HOURS AS NEEDED FOR PAIN 120 tablet 2   No current facility-administered medications for this visit.    Allergies:   Veltassa [patiromer], Darvon, Promethazine hcl, Spironolactone, and Plavix [clopidogrel bisulfate]   Social History:  The patient  reports that she quit smoking about 24 years ago. She has never used smokeless tobacco. She reports that she does not drink alcohol and does not use drugs.   Family History:  The patient's family history includes Breast cancer in her sister; Colon cancer in her maternal aunt; Diabetes in her mother; Early death (  age of onset: 10) in her brother; Heart attack in her father; Heart disease in her father and mother; Hyperlipidemia in her mother; Hypertension in her father and mother; Irritable bowel syndrome in her sister; Lung cancer in her sister.  ROS:  Please see the history of present illness. All other systems are reviewed and otherwise negative.   PHYSICAL EXAM:  VS:  There were no vitals taken for this visit. BMI: There is no height or weight on file to calculate BMI. Well nourished, well developed, in no acute distress  HEENT: normocephalic, atraumatic  Neck: no JVD, carotid bruits or masses Cardiac:  *** RRR; no significant murmurs, no rubs, or gallops Lungs:  *** CTA b/l, no wheezing, rhonchi or rales  Abd: soft, nontender MS: no deformity or atrophy Ext:  *** 1++ edema L>R Skin: warm and dry, no rash Neuro:  No gross deficits appreciated Psych: euthymic mood, full affect  *** ICD site is stable, no tethering or discomfort   EKG: not done today  ICD interrogation done today and reviewed by myself;  ***   06/27/2019: TTE IMPRESSIONS  1. Left ventricular ejection fraction, by estimation, is 40 to 45%. The  left ventricle has mildly decreased function. The left ventricle   demonstrates regional wall motion abnormalities, infeiror and inferoseptal  hypokinesis. There is mild left  ventricular hypertrophy. Left ventricular diastolic parameters are  consistent with Grade I diastolic dysfunction (impaired relaxation).  2. Right ventricular systolic function is normal. The right ventricular  size is normal. Tricuspid regurgitation signal is inadequate for assessing  PA pressure.  3. Left atrial size was mildly dilated.  4. The mitral valve is normal in structure. No evidence of mitral valve  regurgitation. No evidence of mitral stenosis.  5. The aortic valve is tricuspid. Aortic valve regurgitation is mild. No  aortic stenosis is present.  6. The inferior vena cava is normal in size with greater than 50%  respiratory variability, suggesting right atrial pressure of 3 mmHg.    08/16/2018: TTE IMPRESSIONS  1. The left ventricle has a visually estimated ejection fraction of 40%.  The cavity size was normal. There is moderately increased left ventricular  wall thickness. Left ventricular diastolic Doppler parameters are  consistent with impaired relaxation.  Inferior and inferoseptal severe hypokinesis.  2. The right ventricle has normal systolic function. The cavity was  normal. There is no increase in right ventricular wall thickness.  3. Left atrial size was mildly dilated.  4. There is mild to moderate mitral annular calcification present. No  evidence of mitral valve stenosis. No significant regurgitation.  5. The aortic valve is tricuspid. Moderate calcification of the aortic  valve. Aortic valve regurgitation is mild by color flow Doppler. No  stenosis of the aortic valve.  6. The aortic root is normal in size and structure.  7. Normal IVC. No complete TR doppler jet so unable to estimate PA  systolic pressure.    Recent Labs: 09/29/2019: ALT 15 04/01/2020: BUN 22; Creatinine, Ser 1.44; Hemoglobin 12.5; Platelets 239; Potassium 4.8; Sodium  142  09/29/2019: Cholesterol 135; HDL 56; LDL Cholesterol 56; Total CHOL/HDL Ratio 2.4; Triglycerides 116; VLDL 23   CrCl cannot be calculated (Patient's most recent lab result is older than the maximum 21 days allowed.).   Wt Readings from Last 3 Encounters:  04/11/20 204 lb (92.5 kg)  04/01/20 203 lb (92.1 kg)  09/29/19 202 lb (91.6 kg)     Other studies reviewed: Additional studies/records reviewed today include:  summarized above  ASSESSMENT AND PLAN:  1. ICD     ***  2. ICM 3. Chronic CHF (systolic)     Has had some subsequent improvement in LVEF 40-45% by last year's echo     Follows closely with Dr. Aundra Dubin and the HF team  4. CAD      *** No symptoms of angina      Follows with Dr. Aundra Dubin  5. Paroxysmal Afib     CHA2DS2Vasc is 9, on Eliquis, *** appropriately dosed for age/weight     *** <1%  6. HTN     *** Looks good  7. HLD     *** managed with Dr. Eulis Manly  8. Hx of PMVT     *** On low dose amiodarone     *** Labs monitored via her PMD    Disposition: ***   Current medicines are reviewed at length with the patient today.  The patient did not have any concerns regarding medicines.  Venetia Night, PA-C 05/12/2020 11:06 AM     [facility] [place]  [place] [address] [phone] (office)  [phone] (fax)

## 2020-05-17 ENCOUNTER — Encounter: Payer: Medicare PPO | Admitting: Physician Assistant

## 2020-05-22 NOTE — Telephone Encounter (Signed)
Patients EP office visit rescheduled for 05/29/2020 with Tommye Standard, PA.  Message sent to [facility] asking to turn CorVue on at the office.

## 2020-05-26 NOTE — Progress Notes (Signed)
Cardiology Office Note Date:  05/26/2020  Patient ID:  Ioma, Chismar 1946/03/11, MRN [id number] PCP:  Luciano Cutter, MD  Cardiologist:  Dr. Aundra Dubin EP: Dr. Rayann Heman     Chief Complaint:   wound check  History of Present Illness: [name] is a 75 y.o. female with history of CAD (s/p LAD and RCA PCI.  Last LHC in 11/12 with patent proximal LAD stent, ostial 70% D1 (jailed by stent), mild LAD stent patent, patent RCA stents), ICM, chronic CHF, VT, ICD, AFib, stroke, DM, HTN, HLD, CKD (III), gastroparesis, Hypothyroidism.  She last saw Dr. Aundra Dubin July 2021colume stable, SBP 90's with fatigue, entresto was reduced (noted as well she had not been able to tolerate spironolactone or eplerenone)  She comes in today to be seen for Dr. Rayann Heman, last seen by him Jan 2022, device had reached ERI, planned for fluoroscopy of her Riata lead at time of gen change.  Lead was intact and not replaced, underwent generator change alone.  TODAY She is accompanied by her daughter She feels well. Denies any CP, palpitations or SOB. No dizzy spells, near syncope or syncope Denies SOB/DOE or difficulties with her ADLs  She noted the edge of her wound had a scab perhaps a few weeks ago, says she doesn't like to look at it, and did notice it initially at least, mentions she asked her PMD to taake a look 2 weeks ago or so and reports he told her he thought is was healing up OK. Unfortunately she had to cancel a few times for her follow up with things that came up. She denies any pain at the site, has not drained or bled. No symptoms of illness, fever.   Device information SJM dual chamber ICD, implanted 2003,  2007, gen changes 2013, Jan 2022 2003 had syncope >> EP study with inducible PMVT + h/o PMVT 2012 with ICD shocks  At the time of her gen change Jan 2022,  SJM Riata ST 7040 lead was fluoroscopically and electrically normal today, opted to use this lead rather than replacing it after a  long discussion with the patient prior to the procedure.   AAD Hx 2007 amiodarone started quickly stopped 2/2 nausea Remotely as well tried on Sotalol and Multaq unclear when/why they were stopped 2012 restarted on amiodarone   Past Medical History:  Diagnosis Date  . Allergy   . Arthritis   . Atrial fibrillation ([place])    controlled with amiodarone, on coumadin  . Chronic renal insufficiency   . Chronic systolic heart failure ([place])   . Congestive heart failure ([place])   . Coronary artery disease 01/31/2011  . Diabetes mellitus    type 1  . 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   . History of chicken pox   . Hyperlipidemia   . Hypertension   . Ischemic cardiomyopathy   . Lumbar spondylosis 01/11/2012  . Sleep apnea   . Stroke [facility]) 2010   eye doctor said she had TIA  . Ventricular tachycardia ([place])    Polymorphic    Past Surgical History:  Procedure Laterality Date  . ABDOMINAL HYSTERECTOMY  1995  . CARDIAC DEFIBRILLATOR PLACEMENT    . CHOLECYSTECTOMY  1985  . COLONOSCOPY WITH PROPOFOL N/A 02/24/2018   Procedure: COLONOSCOPY WITH PROPOFOL;  Surgeon: Milus Banister, MD;  Location: [facility] ENDOSCOPY;  Service: Endoscopy;  Laterality: N/A;  . ICD  2003/2007   implanted by Dr Rollene Fare, most recent generator change 2/13 by Dr  Allred, Analyze ST study patient  . ICD GENERATOR CHANGEOUT N/A 04/11/2020   Procedure: ICD GENERATOR CHANGEOUT;  Surgeon: Thompson Grayer, MD;  Location: [facility];  Service: Cardiovascular;  Laterality: N/A;  . IMPLANTABLE CARDIOVERTER DEFIBRILLATOR (ICD) GENERATOR CHANGE N/A 05/05/2011   Procedure: ICD GENERATOR CHANGE;  Surgeon: Thompson Grayer, MD;  Location[facility] CATH LAB;  Service: Cardiovascular;  Laterality: N/A;  . LEFT HEART CATHETERIZATION WITH CORONARY ANGIOGRAM N/A 01/30/2011   Procedure: LEFT HEART CATHETERIZATION WITH CORONARY ANGIOGRAM;  Surgeon: Larey Dresser, MD;  Location: [facility] CATH LAB;  Service: Cardiovascular;  Laterality:  N/A;  . POLYPECTOMY  02/24/2018   Procedure: POLYPECTOMY;  Surgeon: Milus Banister, MD;  Location: [facility] ENDOSCOPY;  Service: Endoscopy;;  . [facility]  1980    Current Outpatient Medications  Medication Sig Dispense Refill  . acetaminophen (TYLENOL) 500 MG tablet Take 500 mg by mouth daily as needed for moderate pain or headache.    [name] amiodarone (PACERONE) 100 MG tablet TAKE 1 TABLET BY MOUTH EVERY DAY 90 tablet 3  . Blood Glucose Monitoring Suppl (ACCU-CHEK NANO SMARTVIEW) W/DEVICE KIT Use to test blood sugar 4 times daily as instructed. Dx code: E11.59 1 kit 0  . carvedilol (COREG) 25 MG tablet TAKE 1 TABLET BY MOUTH 2 (TWO) TIMES DAILY WITH A MEAL. 180 tablet 3  . Cholecalciferol (VITAMIN D3) 2000 UNITS TABS Take 2,000 Units by mouth daily.    . dapagliflozin propanediol (FARXIGA) 10 MG TABS tablet Take 1 tablet (10 mg total) by mouth daily before breakfast. 30 tablet 5  . dicyclomine (BENTYL) 10 MG capsule TAKE 1 CAPSULE (10 MG TOTAL) BY MOUTH 4 (FOUR) TIMES DAILY BEFORE MEALS AND AT BEDTIME. 120 capsule 1  . ELIQUIS 5 MG TABS tablet TAKE 1 TABLET BY MOUTH TWICE A DAY 60 tablet 11  . ENTRESTO 49-51 MG TAKE 1 TABLET BY MOUTH 2 (TWO) TIMES DAILY. 60 tablet 6  . fluticasone (FLONASE) 50 MCG/ACT nasal spray Place into the nose.    . furosemide (LASIX) 20 MG tablet Take 20 mg by mouth daily.    [name] gabapentin (NEURONTIN) 400 MG capsule TAKE 1 CAPSULE (400 MG TOTAL) BY MOUTH 2 (TWO) TIMES DAILY. 180 capsule 1  . glucose blood (ACCU-CHEK SMARTVIEW) test strip Use to test blood sugar 4 times daily as instructed. Dx code E11.59 200 each 11  . insulin aspart (NOVOLOG) 100 UNIT/ML injection Inject 10-16 Units into the skin 3 (three) times daily with meals. 37.8 mL 2  . insulin detemir (LEVEMIR FLEXTOUCH) 100 UNIT/ML FlexPen INJECT 40 UNITS INTO THE SKIN DAILY AT 10 PM. 15 mL 1  . levothyroxine (SYNTHROID) 25 MCG tablet TAKE 1 TABLET BY MOUTH EVERY MORNING BEFORE BREAKFAST 30 tablet 0  . magnesium  oxide (MAG-OX) 400 MG tablet TAKE 1 TABLET BY MOUTH TWICE A DAY 180 tablet 0  . metFORMIN (GLUCOPHAGE-XR) 500 MG 24 hr tablet Take 2 tablets (1,000 mg total) by mouth daily with supper. 180 tablet 3  . Multiple Vitamins-Minerals (EYE VITAMINS PO) Take 1 tablet by mouth 2 (two) times daily.     . nitroGLYCERIN (NITROSTAT) 0.4 MG SL tablet Place 1 tablet (0.4 mg total) under the tongue every 5 (five) minutes as needed for chest pain. 25 tablet 1  . omeprazole (PRILOSEC) 20 MG capsule TAKE 1 CAPSULE BY MOUTH EVERY DAY 90 capsule 1  . rosuvastatin (CRESTOR) 40 MG tablet TAKE 1 TABLET BY MOUTH EVERY DAY 90 tablet 3  . traMADol (ULTRAM) 50  MG tablet TAKE 1 TABLET BY MOUTH EVERY 6 HOURS AS NEEDED FOR PAIN 120 tablet 2   No current facility-administered medications for this visit.    Allergies:   Veltassa [patiromer], Darvon, Promethazine hcl, Spironolactone, and Plavix [clopidogrel bisulfate]   Social History:  The patient  reports that she quit smoking about 24 years ago. She has never used smokeless tobacco. She reports that she does not drink alcohol and does not use drugs.   Family History:  The patient's family history includes Breast cancer in her sister; Colon cancer in her maternal aunt; Diabetes in her mother; Early death (age of onset: 78) in her brother; Heart attack in her father; Heart disease in her father and mother; Hyperlipidemia in her mother; Hypertension in her father and mother; Irritable bowel syndrome in her sister; Lung cancer in her sister.  ROS:  Please see the history of present illness. All other systems are reviewed and otherwise negative.   PHYSICAL EXAM:  VS:  There were no vitals taken for this visit. BMI: There is no height or weight on file to calculate BMI. Well nourished, well developed, in no acute distress  HEENT: normocephalic, atraumatic  Neck: no JVD, carotid bruits or masses Cardiac:  RRR; no significant murmurs, no rubs, or gallops Lungs:  CTA b/l, no  wheezing, rhonchi or rales  Abd: soft, nontender MS: no deformity or atrophy Ext:  Trace edema L>R Skin: warm and dry, no rash Neuro:  No gross deficits appreciated Psych: euthymic mood, full affect  ICD site has a fairly large scab at the lateral edge of the wound, circular, about 2cm diameter with some surrounding erythema, not warm, no drainage, no tenderness   EKG: not done today  ICD interrogation done today and reviewed by myself;  Battery and lead measurements are good AMS noted 3.2% burden One episode of 3 hours, the others are largely measured in seconds No VT   06/27/2019: TTE IMPRESSIONS  1. Left ventricular ejection fraction, by estimation, is 40 to 45%. The  left ventricle has mildly decreased function. The left ventricle  demonstrates regional wall motion abnormalities, infeiror and inferoseptal  hypokinesis. There is mild left  ventricular hypertrophy. Left ventricular diastolic parameters are  consistent with Grade I diastolic dysfunction (impaired relaxation).  2. Right ventricular systolic function is normal. The right ventricular  size is normal. Tricuspid regurgitation signal is inadequate for assessing  PA pressure.  3. Left atrial size was mildly dilated.  4. The mitral valve is normal in structure. No evidence of mitral valve  regurgitation. No evidence of mitral stenosis.  5. The aortic valve is tricuspid. Aortic valve regurgitation is mild. No  aortic stenosis is present.  6. The inferior vena cava is normal in size with greater than 50%  respiratory variability, suggesting right atrial pressure of 3 mmHg.    08/16/2018: TTE IMPRESSIONS  1. The left ventricle has a visually estimated ejection fraction of 40%.  The cavity size was normal. There is moderately increased left ventricular  wall thickness. Left ventricular diastolic Doppler parameters are  consistent with impaired relaxation.  Inferior and inferoseptal severe hypokinesis.  2.  The right ventricle has normal systolic function. The cavity was  normal. There is no increase in right ventricular wall thickness.  3. Left atrial size was mildly dilated.  4. There is mild to moderate mitral annular calcification present. No  evidence of mitral valve stenosis. No significant regurgitation.  5. The aortic valve is tricuspid. Moderate calcification of  the aortic  valve. Aortic valve regurgitation is mild by color flow Doppler. No  stenosis of the aortic valve.  6. The aortic root is normal in size and structure.  7. Normal IVC. No complete TR doppler jet so unable to estimate PA  systolic pressure.    Recent Labs: 09/29/2019: ALT 15 04/01/2020: BUN 22; Creatinine, Ser 1.44; Hemoglobin 12.5; Platelets 239; Potassium 4.8; Sodium 142  09/29/2019: Cholesterol 135; HDL 56; LDL Cholesterol 56; Total CHOL/HDL Ratio 2.4; Triglycerides 116; VLDL 23   CrCl cannot be calculated (Patient's most recent lab result is older than the maximum 21 days allowed.).   Wt Readings from Last 3 Encounters:  04/11/20 204 lb (92.5 kg)  04/01/20 203 lb (92.1 kg)  09/29/19 202 lb (91.6 kg)     Other studies reviewed: Additional studies/records reviewed today include: summarized above  ASSESSMENT AND PLAN:  1. ICD     Intact function, CorVue was turned on, no other programming changes made  Wound as described above Dr. Rayann Heman was in a case while she was here, after she left, recommended that she be put on his schedule tomorrow to evaluate the wound, perhaps a retained stitch. The staff was able to get ahold of the patient and she will be able to come in tomorrow.  2. ICM 3. Chronic CHF (systolic)     Has had some subsequent improvement in LVEF 40-45% by last year's echo     Follows closely with Dr. Aundra Dubin and the HF team  4. CAD      No symptoms of angina      Follows with Dr. Aundra Dubin  5. Paroxysmal Afib     CHA2DS2Vasc is 9, on Eliquis, appropriately dosed     3.2% burden  6.  HTN     No changes today  7. HLD     Not addressed today  8. Hx of PMVT     On low dose amiodarone     Labs today, no TSH in a while, though she reports her PMD manages and adjusted her synthroid dose about 6 mo ago   Disposition: as above, she will see Dr. Rayann Heman tomorrow   Current medicines are reviewed at length with the patient today.  The patient did not have any concerns regarding medicines.  Venetia Night, PA-C 05/26/2020 4:53 PM     [address] [place] [address] [phone] (office)  [phone] (fax)

## 2020-05-29 ENCOUNTER — Ambulatory Visit: Payer: Medicare HMO | Admitting: Physician Assistant

## 2020-05-29 ENCOUNTER — Other Ambulatory Visit: Payer: Self-pay

## 2020-05-29 ENCOUNTER — Encounter: Payer: Self-pay | Admitting: Physician Assistant

## 2020-05-29 VITALS — BP 144/68 | HR 65 | Ht 63.0 in | Wt 198.6 lb

## 2020-05-29 DIAGNOSIS — I5022 Chronic systolic (congestive) heart failure: Secondary | ICD-10-CM

## 2020-05-29 DIAGNOSIS — I480 Paroxysmal atrial fibrillation: Secondary | ICD-10-CM | POA: Diagnosis not present

## 2020-05-29 DIAGNOSIS — I10 Essential (primary) hypertension: Secondary | ICD-10-CM

## 2020-05-29 DIAGNOSIS — I472 Ventricular tachycardia, unspecified: Secondary | ICD-10-CM

## 2020-05-29 DIAGNOSIS — Z5189 Encounter for other specified aftercare: Secondary | ICD-10-CM

## 2020-05-29 DIAGNOSIS — Z95810 Presence of automatic (implantable) cardiac defibrillator: Secondary | ICD-10-CM

## 2020-05-29 DIAGNOSIS — Z79899 Other long term (current) drug therapy: Secondary | ICD-10-CM | POA: Diagnosis not present

## 2020-05-29 LAB — CUP PACEART INCLINIC DEVICE CHECK
Battery Remaining Longevity: 90 mo
Brady Statistic RA Percent Paced: 74 %
Brady Statistic RV Percent Paced: 8.8 %
Date Time Interrogation Session: 20220309155956
HighPow Impedance: 50.625
Implantable Lead Implant Date: 20070117
Implantable Lead Implant Date: 20070117
Implantable Lead Location: 753859
Implantable Lead Location: 753860
Implantable Lead Model: 7040
Implantable Pulse Generator Implant Date: 20220120
Lead Channel Impedance Value: 487.5 Ohm
Lead Channel Impedance Value: 562.5 Ohm
Lead Channel Pacing Threshold Amplitude: 0.5 V
Lead Channel Pacing Threshold Amplitude: 0.5 V
Lead Channel Pacing Threshold Amplitude: 0.75 V
Lead Channel Pacing Threshold Amplitude: 0.75 V
Lead Channel Pacing Threshold Pulse Width: 0.5 ms
Lead Channel Pacing Threshold Pulse Width: 0.5 ms
Lead Channel Pacing Threshold Pulse Width: 0.5 ms
Lead Channel Pacing Threshold Pulse Width: 0.5 ms
Lead Channel Sensing Intrinsic Amplitude: 11.8 mV
Lead Channel Sensing Intrinsic Amplitude: 4.9 mV
Lead Channel Setting Pacing Amplitude: 2.5 V
Lead Channel Setting Pacing Amplitude: 2.5 V
Lead Channel Setting Pacing Pulse Width: 0.5 ms
Lead Channel Setting Sensing Sensitivity: 0.5 mV
Pulse Gen Serial Number: 9976817

## 2020-05-29 LAB — CBC
Hematocrit: 39.8 % (ref 34.0–46.6)
Hemoglobin: 12.8 g/dL (ref 11.1–15.9)
MCH: 29.9 pg (ref 26.6–33.0)
MCHC: 32.2 g/dL (ref 31.5–35.7)
MCV: 93 fL (ref 79–97)
Platelets: 189 10*3/uL (ref 150–450)
RBC: 4.28 x10E6/uL (ref 3.77–5.28)
RDW: 13.4 % (ref 11.7–15.4)
WBC: 5.7 10*3/uL (ref 3.4–10.8)

## 2020-05-29 LAB — HEPATIC FUNCTION PANEL
ALT: 26 IU/L (ref 0–32)
AST: 21 IU/L (ref 0–40)
Albumin: 3.8 g/dL (ref 3.7–4.7)
Alkaline Phosphatase: 73 IU/L (ref 44–121)
Bilirubin Total: 0.5 mg/dL (ref 0.0–1.2)
Bilirubin, Direct: 0.15 mg/dL (ref 0.00–0.40)
Total Protein: 6.3 g/dL (ref 6.0–8.5)

## 2020-05-29 LAB — BASIC METABOLIC PANEL
BUN/Creatinine Ratio: 18 (ref 12–28)
BUN: 32 mg/dL — ABNORMAL HIGH (ref 8–27)
CO2: 17 mmol/L — ABNORMAL LOW (ref 20–29)
Calcium: 8.8 mg/dL (ref 8.7–10.3)
Chloride: 104 mmol/L (ref 96–106)
Creatinine, Ser: 1.75 mg/dL — ABNORMAL HIGH (ref 0.57–1.00)
Glucose: 285 mg/dL — ABNORMAL HIGH (ref 65–99)
Potassium: 4.5 mmol/L (ref 3.5–5.2)
Sodium: 140 mmol/L (ref 134–144)
eGFR: 30 mL/min/{1.73_m2} — ABNORMAL LOW (ref >59)

## 2020-05-29 LAB — TSH: TSH: 0.726 u[IU]/mL (ref 0.450–4.500)

## 2020-05-29 NOTE — Patient Instructions (Addendum)
Medication Instructions:   Your physician recommends that you continue on your current medications as directed. Please refer to the Current Medication list given to you today.  *If you need a refill on your cardiac medications before your next appointment, please call your pharmacy*   Lab Work:  BMET  LFT CBC AND TSH TODAY    If you have labs (blood work) drawn today and your tests are completely normal, you will receive your results only by: [name] MyChart Message (if you have MyChart) OR . A paper copy in the mail If you have any lab test that is abnormal or we need to change your treatment, we will call you to review the results.   Testing/Procedures: NONE ORDERED  TODAY   Follow-Up: At [facility], you and your health needs are our priority.  As part of our continuing mission to provide you with exceptional heart care, we have created designated Provider Care Teams.  These Care Teams include your primary Cardiologist (physician) and Advanced Practice Providers (APPs -  Physician Assistants and Nurse Practitioners) who all work together to provide you with the care you need, when you need it.  We recommend signing up for the patient portal called "MyChart".  Sign up information is provided on this After Visit Summary.  MyChart is used to connect with patients for Virtual Visits (Telemedicine).  Patients are able to view lab/test results, encounter notes, upcoming appointments, etc.  Non-urgent messages can be sent to your provider as well.   To learn more about what you can do with MyChart, go to [email or web address].    Your next appointment:   1 week(s)  The format for your next appointment:   In Person  Provider:   Thompson Grayer, MD   Other Instructions

## 2020-05-30 ENCOUNTER — Encounter: Payer: Self-pay | Admitting: Internal Medicine

## 2020-05-30 ENCOUNTER — Telehealth: Payer: Self-pay | Admitting: *Deleted

## 2020-05-30 ENCOUNTER — Ambulatory Visit: Payer: Medicare HMO | Admitting: Internal Medicine

## 2020-05-30 VITALS — BP 74/40 | HR 66 | Ht 63.0 in | Wt 200.0 lb

## 2020-05-30 DIAGNOSIS — Z95810 Presence of automatic (implantable) cardiac defibrillator: Secondary | ICD-10-CM

## 2020-05-30 DIAGNOSIS — I472 Ventricular tachycardia, unspecified: Secondary | ICD-10-CM

## 2020-05-30 DIAGNOSIS — I480 Paroxysmal atrial fibrillation: Secondary | ICD-10-CM

## 2020-05-30 DIAGNOSIS — I5022 Chronic systolic (congestive) heart failure: Secondary | ICD-10-CM | POA: Diagnosis not present

## 2020-05-30 MED ORDER — CEPHALEXIN 500 MG PO CAPS: 500.0000 mg | ORAL_CAPSULE | Freq: Two times a day (BID) | ORAL | 0 refills | Status: AC

## 2020-05-30 NOTE — Patient Instructions (Addendum)
Medication Instructions:  Start Keflex 500 mg two times a day for 7 days Your physician recommends that you continue on your current medications as directed. Please refer to the Current Medication list given to you today.  Labwork: None ordered.  Testing/Procedures: None ordered.  Follow-Up: Your physician wants you to follow-up in: Device clinic 06/06/20 4:30 pm   06/12/20 at 8:45 am with Dr. Rayann Heman  Remote monitoring is used to monitor your ICD from home. This monitoring reduces the number of office visits required to check your device to one time per year. It allows [name] to keep an eye on the functioning of your device to ensure it is working properly. You are scheduled for a device check from home on 07/17/20. You may send your transmission at any time that day. If you have a wireless device, the transmission will be sent automatically. After your physician reviews your transmission, you will receive a postcard with your next transmission date.  Any Other Special Instructions Will Be Listed Below (If Applicable).  If you need a refill on your cardiac medications before your next appointment, please call your pharmacy.

## 2020-05-30 NOTE — Telephone Encounter (Signed)
Lvm to call back about results

## 2020-05-30 NOTE — Progress Notes (Signed)
The patient presents to evaluate her ICD pocket.  She underwent ICD generator change by me 04/11/20.  Unfortunately, she was not compliant with her wound check.  Her daughter reports that she did not have a ride and lives several hours away.  The patient has developed a large escar over the lateral portion of her incision.  On exam today, this area was dbrided with sterile technique in the office.  A 1cm retained stitch was removed from the pocket.  Granulation tissues was noted.  steristrips were placed to approximate the tissue  Fortunately, there is no device pocket welling, draining, warmth, or tenderness.  Assessment: 1. Retained stitch from recent ICD generator change with poor adherence to wound check follow-up by patient. 2. Successful retained stitch removal today  Plan 1. Keflex 500mg  BID 2. Return in 1 week for wound evaluation 3. Return in 2 weeks to see me  [name] Grayer MD, [facility] 05/30/2020 11:58 AM

## 2020-06-06 ENCOUNTER — Other Ambulatory Visit: Payer: Self-pay

## 2020-06-06 ENCOUNTER — Encounter: Payer: Self-pay | Admitting: Internal Medicine

## 2020-06-06 ENCOUNTER — Ambulatory Visit (INDEPENDENT_AMBULATORY_CARE_PROVIDER_SITE_OTHER): Payer: Medicare HMO | Admitting: Emergency Medicine

## 2020-06-06 ENCOUNTER — Ambulatory Visit: Payer: Medicare HMO

## 2020-06-06 ENCOUNTER — Ambulatory Visit: Payer: Medicare HMO | Admitting: Internal Medicine

## 2020-06-06 ENCOUNTER — Other Ambulatory Visit (HOSPITAL_COMMUNITY): Payer: Self-pay | Admitting: Cardiology

## 2020-06-06 VITALS — BP 128/72 | HR 67 | Ht 63.0 in | Wt 200.2 lb

## 2020-06-06 DIAGNOSIS — E1142 Type 2 diabetes mellitus with diabetic polyneuropathy: Secondary | ICD-10-CM | POA: Diagnosis not present

## 2020-06-06 DIAGNOSIS — E1165 Type 2 diabetes mellitus with hyperglycemia: Secondary | ICD-10-CM

## 2020-06-06 DIAGNOSIS — E785 Hyperlipidemia, unspecified: Secondary | ICD-10-CM

## 2020-06-06 DIAGNOSIS — E1151 Type 2 diabetes mellitus with diabetic peripheral angiopathy without gangrene: Secondary | ICD-10-CM

## 2020-06-06 DIAGNOSIS — I255 Ischemic cardiomyopathy: Secondary | ICD-10-CM

## 2020-06-06 DIAGNOSIS — IMO0002 Reserved for concepts with insufficient information to code with codable children: Secondary | ICD-10-CM

## 2020-06-06 LAB — CUP PACEART INCLINIC DEVICE CHECK
Battery Remaining Longevity: 90 mo
Brady Statistic RA Percent Paced: 79 %
Brady Statistic RV Percent Paced: 7.9 %
Date Time Interrogation Session: 20220317121302
HighPow Impedance: 51.6234
Implantable Lead Implant Date: 20070117
Implantable Lead Implant Date: 20070117
Implantable Lead Location: 753859
Implantable Lead Location: 753860
Implantable Lead Model: 7040
Implantable Pulse Generator Implant Date: 20220120
Lead Channel Impedance Value: 487.5 Ohm
Lead Channel Impedance Value: 712.5 Ohm
Lead Channel Pacing Threshold Amplitude: 0.5 V
Lead Channel Pacing Threshold Amplitude: 0.75 V
Lead Channel Pacing Threshold Pulse Width: 0.5 ms
Lead Channel Pacing Threshold Pulse Width: 0.5 ms
Lead Channel Sensing Intrinsic Amplitude: 11.8 mV
Lead Channel Sensing Intrinsic Amplitude: 4 mV
Lead Channel Setting Pacing Amplitude: 2.5 V
Lead Channel Setting Pacing Amplitude: 2.5 V
Lead Channel Setting Pacing Pulse Width: 0.5 ms
Lead Channel Setting Sensing Sensitivity: 0.5 mV
Pulse Gen Serial Number: 9976817

## 2020-06-06 MED ORDER — OZEMPIC (0.25 OR 0.5 MG/DOSE) 2 MG/1.5ML ~~LOC~~ SOPN: 0.5000 mg | PEN_INJECTOR | SUBCUTANEOUS | 5 refills | Status: DC

## 2020-06-06 NOTE — Progress Notes (Signed)
Patient ID: [name], female   DOB: 01-15-1946, 75 y.o.   MRN: [id number]  This visit occurred during the SARS-CoV-2 public health emergency.  Safety protocols were in place, including screening questions prior to the visit, additional usage of staff PPE, and extensive cleaning of exam room while observing appropriate contact time as indicated for disinfecting solutions.   HPI: [name] is a 75 y.o.-year-old female, presenting for f/u for DM2, insulin-dependent, uncontrolled, with complications (CAD, ICM - had ICD, peripheral neuropathy, gastroparesis). Last visit 4 months ago (telephone).  Interim history: Since last visit, sugars have been higher.  She had a recent HbA1c which was increased, at 9.6% and also glucose a days ago was high, on CMP, at 285. She continues to get steroid injs in knee  - last 03/2020.  Sugars remain very high.  However, she also had 1 low blood sugar later in the day, in the 50s but not in the last months. She saw her nephrologist who recommended to start Metformin.  Reviewed HbA1c levels: 05/13/2020: HbA1c 9.6% 01/22/2020: HbA1c 8% 03/13/2019: HbA1c 7.1% Lab Results  Component Value Date   HGBA1C 7.7 (A) 11/23/2018   HGBA1C 7.6 (H) 08/16/2018   HGBA1C 6.1 (A) 02/25/2018  07/03/2013: 7.2% - through her insurance  She is now on: - Metformin XR 1000 mg with dinner - Levemir 35 units at bedtime - Novolog 10-16 units before meals (use the higher dose after a steroid inj) She was previously on Farxiga but stopped due to side effects (somnolence?)  Pt checks her sugars 3-4 times a day: - am: 61, 102-184 >> 85-150 >> 110-150 >> 86, 110-182, 213, 257 >> 75, 110-182, but 210-385 after a injection - after b'fast: 175 >> n/c - before lunch: 110-187, 240, 256 >> 94-150, 251 >> 126-310 (steroid inj) >> 81, 116-432, 515 - after lunch:  45, 47, 140-150 >> n/c - before dinner: 97-184, 220, 257 >> 120-180, 352 >> ? >> 71, 130-410 - bedtime: 71, 110-275 >>  n/c >> 87-258 >> 115-210 >> 140-202 >> 110-360 Lowest sugar was 50s at night >> 94 >> 86 >> 58 (pm, after dinner, or at night);  she has hypoglycemia awareness in the 90s. Highest sugar was 352 (correction of a 94) >> 310 >> 515 (steroid).  -+ CKD, last BUN/creatinine:   Lab Results  Component Value Date   BUN 32 (H) 05/29/2020   CREATININE 1.75 (H) 05/29/2020   GFR low -she sees nephrology: Lab Results  Component Value Date   GFRNONAA 36 (L) 04/01/2020   GFRNONAA 29 (L) 09/29/2019   GFRNONAA 32 (L) 01/10/2019   GFRNONAA 28 (L) 08/16/2018   GFRNONAA 38 (L) 05/20/2018   ACR normal: Lab Results  Component Value Date   MICRALBCREAT 5.3 08/16/2018   MICRALBCREAT 9.4 04/08/2017   MICRALBCREAT 3.2 06/25/2016   MICRALBCREAT 1.6 12/21/2014   MICRALBCREAT 1.0 12/15/2012  On Entresto.  -+ HL; last set of lipids: Lab Results  Component Value Date   CHOL 135 09/29/2019   HDL 56 09/29/2019   LDLCALC 56 09/29/2019   TRIG 116 09/29/2019   CHOLHDL 2.4 09/29/2019  On Crestor 20.  - last eye exam was in 2020: No DR  - she has numbness and tingling in her feet-on Neurontin.  She is on Eliquis.  Also, on amiodarone.  She has mild hypothyroidism-latest TSH is normal: Lab Results  Component Value Date   TSH 0.726 05/29/2020   She continues on levothyroxine 25 mcg daily.  She has no family history of medullary thyroid cancer or personal history of pancreatitis.  Her 59 y/o son passed away in 2018/06/18 (had cirrhosis >> developed cough, fever, generalized pain+ bilateral PNA (Covid 19 negative).    Her nephew died in 07/19/2018 and her brother died 11/18/2018.    ROS: Constitutional: no weight gain/no weight loss, no fatigue, no subjective hyperthermia, no subjective hypothermia Eyes: no blurry vision, no xerophthalmia ENT: no sore throat, no nodules palpated in neck, no dysphagia, no odynophagia, no hoarseness Cardiovascular: no CP/no SOB/no palpitations/no leg swelling Respiratory:  no cough/no SOB/no wheezing Gastrointestinal: no N/no V/no D/no C/no acid reflux Musculoskeletal: no muscle aches/no joint aches Skin: no rashes, no hair loss Neurological: no tremors/+ numbness/+ tingling/no dizziness  I reviewed pt's medications, allergies, PMH, social hx, family hx, and changes were documented in the history of present illness. Otherwise, unchanged from my initial visit note.  Past Medical History:  Diagnosis Date  . Allergy   . Arthritis   . Atrial fibrillation ([place])    controlled with amiodarone, on coumadin  . Chronic renal insufficiency   . Chronic systolic heart failure ([place])   . Congestive heart failure ([place])   . Coronary artery disease 01/31/2011  . Diabetes mellitus    type 1  . 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   . History of chicken pox   . Hyperlipidemia   . Hypertension   . Ischemic cardiomyopathy   . Lumbar spondylosis 01/11/2012  . Sleep apnea   . Stroke [facility]) June 17, 2008   eye doctor said she had TIA  . Ventricular tachycardia ([place])    Polymorphic   Past Surgical History:  Procedure Laterality Date  . ABDOMINAL HYSTERECTOMY  1995  . CARDIAC DEFIBRILLATOR PLACEMENT    . CHOLECYSTECTOMY  1985  . COLONOSCOPY WITH PROPOFOL N/A 02/24/2018   Procedure: COLONOSCOPY WITH PROPOFOL;  Surgeon: Milus Banister, MD;  Location: [facility] ENDOSCOPY;  Service: Endoscopy;  Laterality: N/A;  . ICD  2003-28-2007   implanted by Dr Rollene Fare, most recent generator change 2/13 by Dr Rayann Heman, Analyze ST study patient  . ICD GENERATOR CHANGEOUT N/A 04/11/2020   Procedure: ICD GENERATOR CHANGEOUT;  Surgeon: Thompson Grayer, MD;  Location: [facility];  Service: Cardiovascular;  Laterality: N/A;  . IMPLANTABLE CARDIOVERTER DEFIBRILLATOR (ICD) GENERATOR CHANGE N/A 05/05/2011   Procedure: ICD GENERATOR CHANGE;  Surgeon: Thompson Grayer, MD;  Location: [facility] CATH LAB;  Service: Cardiovascular;  Laterality: N/A;  . LEFT HEART CATHETERIZATION WITH CORONARY ANGIOGRAM N/A  01/30/2011   Procedure: LEFT HEART CATHETERIZATION WITH CORONARY ANGIOGRAM;  Surgeon: Larey Dresser, MD;  Location: [facility] CATH LAB;  Service: Cardiovascular;  Laterality: N/A;  . POLYPECTOMY  02/24/2018   Procedure: POLYPECTOMY;  Surgeon: Milus Banister, MD;  Location: [facility] ENDOSCOPY;  Service: Endoscopy;;  . [place] History   Socioeconomic History  . Marital status: Divorced    Spouse name: Not on file  . Number of children: 3  . Years of education: 82  . Highest education level: Not on file  Occupational History  . Occupation: Retried  Tobacco Use  . Smoking status: Former Smoker    Quit date: 08/16/1995    Years since quitting: 24.8  . Smokeless tobacco: Never Used  Vaping Use  . Vaping Use: Never used  Substance and Sexual Activity  . Alcohol use: No  . Drug use: No  . Sexual activity: Never    Birth control/protection: Post-menopausal  Other Topics Concern  .  Not on file  Social History Narrative   Regular exercise-no   Caffeine Use-no   Social Determinants of Health   Financial Resource Strain: Not on file  Food Insecurity: Not on file  Transportation Needs: Not on file  Physical Activity: Not on file  Stress: Not on file  Social Connections: Not on file  Intimate Partner Violence: Not on file   Current Outpatient Medications on File Prior to Visit  Medication Sig Dispense Refill  . acetaminophen (TYLENOL) 500 MG tablet Take 500 mg by mouth daily as needed for moderate pain or headache.    [name] amiodarone (PACERONE) 100 MG tablet TAKE 1 TABLET BY MOUTH EVERY DAY 90 tablet 3  . Blood Glucose Monitoring Suppl (ACCU-CHEK NANO SMARTVIEW) W/DEVICE KIT Use to test blood sugar 4 times daily as instructed. Dx code: E11.59 1 kit 0  . carvedilol (COREG) 25 MG tablet TAKE 1 TABLET BY MOUTH 2 (TWO) TIMES DAILY WITH A MEAL. 180 tablet 3  . cephALEXin (KEFLEX) 500 MG capsule Take 1 capsule (500 mg total) by mouth 2 (two) times daily for 7 days. 14 capsule 0  .  Cholecalciferol (VITAMIN D3) 2000 UNITS TABS Take 2,000 Units by mouth daily.    [name] dicyclomine (BENTYL) 10 MG capsule TAKE 1 CAPSULE (10 MG TOTAL) BY MOUTH 4 (FOUR) TIMES DAILY BEFORE MEALS AND AT BEDTIME. 120 capsule 1  . ELIQUIS 5 MG TABS tablet TAKE 1 TABLET BY MOUTH TWICE A DAY 60 tablet 11  . ENTRESTO 49-51 MG TAKE 1 TABLET BY MOUTH 2 (TWO) TIMES DAILY. 60 tablet 6  . fluticasone (FLONASE) 50 MCG/ACT nasal spray Place into the nose.    . furosemide (LASIX) 20 MG tablet Take 20 mg by mouth daily.    [name] gabapentin (NEURONTIN) 400 MG capsule TAKE 1 CAPSULE (400 MG TOTAL) BY MOUTH 2 (TWO) TIMES DAILY. 180 capsule 1  . glucose blood (ACCU-CHEK SMARTVIEW) test strip Use to test blood sugar 4 times daily as instructed. Dx code E11.59 200 each 11  . insulin aspart (NOVOLOG) 100 UNIT/ML injection Inject 10-16 Units into the skin 3 (three) times daily with meals. 37.8 mL 2  . insulin detemir (LEVEMIR FLEXTOUCH) 100 UNIT/ML FlexPen INJECT 40 UNITS INTO THE SKIN DAILY AT 10 PM. 15 mL 1  . levothyroxine (SYNTHROID) 25 MCG tablet TAKE 1 TABLET BY MOUTH EVERY MORNING BEFORE BREAKFAST 30 tablet 0  . magnesium oxide (MAG-OX) 400 MG tablet TAKE 1 TABLET BY MOUTH TWICE A DAY 180 tablet 0  . metFORMIN (GLUCOPHAGE-XR) 500 MG 24 hr tablet Take 2 tablets (1,000 mg total) by mouth daily with supper. 180 tablet 3  . Multiple Vitamins-Minerals (EYE VITAMINS PO) Take 1 tablet by mouth 2 (two) times daily.     . nitroGLYCERIN (NITROSTAT) 0.4 MG SL tablet Place 1 tablet (0.4 mg total) under the tongue every 5 (five) minutes as needed for chest pain. 25 tablet 1  . omeprazole (PRILOSEC) 20 MG capsule TAKE 1 CAPSULE BY MOUTH EVERY DAY 90 capsule 1  . rosuvastatin (CRESTOR) 40 MG tablet TAKE 1 TABLET BY MOUTH EVERY DAY 90 tablet 3  . traMADol (ULTRAM) 50 MG tablet TAKE 1 TABLET BY MOUTH EVERY 6 HOURS AS NEEDED FOR PAIN 120 tablet 2   No current facility-administered medications on file prior to visit.   Allergies   Allergen Reactions  . Veltassa [Patiromer] Diarrhea  . Darvon Other (See Comments)    indigestion  . Promethazine Hcl Other (See Comments)  hyperactivity  . Spironolactone Diarrhea and Nausea And Vomiting  . Plavix [Clopidogrel Bisulfate] Rash   Family History  Problem Relation Age of Onset  . Diabetes Mother   . Heart disease Mother   . Hyperlipidemia Mother   . Hypertension Mother   . Heart disease Father   . Heart attack Father   . Hypertension Father   . Early death Brother 34  . Breast cancer Sister   . Lung cancer Sister   . Irritable bowel syndrome Sister   . Colon cancer Maternal Aunt   . Esophageal cancer Neg Hx   . Colon polyps Neg Hx     PE: There were no vitals taken for this visit. There is no height or weight on file to calculate BMI.  Wt Readings from Last 3 Encounters:  05/30/20 200 lb (90.7 kg)  05/29/20 198 lb 9.6 oz (90.1 kg)  04/11/20 204 lb (92.5 kg)   Constitutional: overweight, in NAD Eyes: PERRLA, EOMI, no exophthalmos ENT: moist mucous membranes, no thyromegaly, no cervical lymphadenopathy Cardiovascular: RRR, No MRG Respiratory: CTA B Gastrointestinal: abdomen soft, NT, ND, BS+ Musculoskeletal: no deformities, strength intact in all 4 Skin: moist, warm, no rashes Neurological: no tremor with outstretched hands, DTR normal in all 4  ASSESSMENT: 1. DM2, insulin-dependent, controlled, with complications - CAD, ICM - s/p ICD placement - CKD - PN - on neurontin - gastroparesis per [facility] 06/18/2012 >> 60 minutes: 92%, 120 minutes: 82%  2. 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 HL  PLAN:  1. Patient with longstanding, previously uncontrolled type 2 diabetes, on Metformin and basal-bolus insulin regimen.  At last visit sugars were higher especially after steroid injection in the knee.  Upon questioning, she was still using the lower doses of NovoLog recommended and I advised her to increase this.  Since her kidney function was lower, we had to decrease the  Metformin ER dose to only 1000 mg with dinner.  I did ask her to discuss with nephrology to see if we should stop this completely.  HbA1c was 8.0% (01/22/2020).  However, she had a more recent HbA1c on 05/13/2020 and this was 9.6%, much higher! -At this visit, she has significant variability of her TFTs, from the 50s, to the 500s.  Her sugars usually increase significantly with a steroid injection and decrease afterwards, but in the last 2 weeks, sugars have remained higher despite not having a recent injection.  For now, we discussed about keeping insulin doses stable. -She was previously on an SGLT2 inhibitor but she could not tolerate it due to side effects (somnolence ?).  We did not try a GLP-1 receptor agonist due to her history of gastroparesis.  However, at this visit, especially in the setting of worsening diabetes, I suggested to at least try a low dose of Ozempic.  She may be able to tolerate it despite her history of gastroparesis.  She agrees with this.  Her daughter takes it with good results. -At this visit, we will stop Metformin due to the low kidney function -I also strongly advised her to stop drinking milk, which she does at night.  This may be one of the reasons for high blood sugars in the morning.  I also advised her to stop sweet tea completely.  As of now, she is drinking this diluted with unsweetened tea. - I suggested to: Patient Instructions  Please stop Metformin ER.  Please continue: - Levemir 35 units at bedtime - Novolog 10-16 units before meals (use  the higher dose after a steroid inj)  Please start Ozempic 0.25 mg weekly in a.m. (for example on Sunday morning) x 4 weeks, then increase to 0.5 mg weekly in a.m. if no nausea or hypoglycemia.  STOP MILK!!!!   Please return in 3 months with your sugar log.   - advised to check sugars at different times of the day - 3x a day, rotating check times - advised for yearly eye exams >> she is not UTD - return to clinic in 3  months  2. PN -No new complaints today -She continues on Neurontin-no side effects  3. HL -Reviewed latest lipid panel from 09/2019: All fractions are at goal: Lab Results  Component Value Date   CHOL 135 09/29/2019   HDL 56 09/29/2019   LDLCALC 56 09/29/2019   TRIG 116 09/29/2019   CHOLHDL 2.4 09/29/2019  -Continues Crestor 20 mg daily without side effects  Philemon Kingdom, MD PhD [facility]

## 2020-06-06 NOTE — Progress Notes (Signed)
Wound check appointment secondary to abcess/retained stitch. Steri-strips removed. Wound without redness or edema, minimal yellow drainage noted without odor. AT in to assess wound for additional infection. Advised may leave OTA once drainage has subsided. Normal device function. Thresholds, sensing, and impedances consistent with previous measurements. Device programmed for appropriate safety margins in chronic leads. Histogram distribution appropriate for patient and level of activity. AMS episodes reviewed. Most recent longest 06/04/20 for 2 hours. Peak A/V 640/80. Ams burdan 2.1%. +OAC. No ventricular arrhythmias noted. Confirmed CorVue monitoring is active on device. Patient educated about wound care. ROV 06/12/20 with Dr. Rayann Heman. Remote monitoring to be addressed at [place].

## 2020-06-06 NOTE — Patient Instructions (Addendum)
Please stop Metformin ER.  Please continue: - Levemir 35 units at bedtime - Novolog 10-16 units before meals (use the higher dose after a steroid inj)  Please start Ozempic 0.25 mg weekly in a.m. (for example on Sunday morning) x 4 weeks, then increase to 0.5 mg weekly in a.m. if no nausea or hypoglycemia.  STOP MILK!!!!   Please return in 3 months with your sugar log.

## 2020-06-07 ENCOUNTER — Encounter: Payer: Medicare HMO | Admitting: Internal Medicine

## 2020-06-10 ENCOUNTER — Telehealth: Payer: Self-pay | Admitting: Internal Medicine

## 2020-06-10 DIAGNOSIS — IMO0002 Reserved for concepts with insufficient information to code with codable children: Secondary | ICD-10-CM

## 2020-06-10 DIAGNOSIS — E1151 Type 2 diabetes mellitus with diabetic peripheral angiopathy without gangrene: Secondary | ICD-10-CM

## 2020-06-10 NOTE — Telephone Encounter (Signed)
PT called because Dr.G sent a prescription for Ozempic for a 6 month supply and pt states it is way too expensive. Pt would like to either try a 30 day prescription or to try a trulicity prescription, whichever is cheaper.   [id number] - [place], [place] [address] Phone:  [phone]  Fax:  [phone]

## 2020-06-12 ENCOUNTER — Ambulatory Visit: Payer: Medicare HMO | Admitting: Internal Medicine

## 2020-06-12 ENCOUNTER — Encounter: Payer: Medicare HMO | Admitting: Internal Medicine

## 2020-06-12 DIAGNOSIS — Z4889 Encounter for other specified surgical aftercare: Secondary | ICD-10-CM

## 2020-06-13 MED ORDER — OZEMPIC (0.25 OR 0.5 MG/DOSE) 2 MG/1.5ML ~~LOC~~ SOPN: 0.5000 mg | PEN_INJECTOR | SUBCUTANEOUS | 0 refills | Status: DC

## 2020-06-13 NOTE — Telephone Encounter (Signed)
Called and left a message advising pt 30 day supply was sent to preferred pharmacy. Pt advised to call back if cost of medication is too much.

## 2020-06-19 ENCOUNTER — Encounter (HOSPITAL_COMMUNITY): Payer: Medicare PPO | Admitting: Cardiology

## 2020-06-26 NOTE — Telephone Encounter (Signed)
Attempted ICM Intro call to patient and left message with phone number for return call.

## 2020-06-28 NOTE — Telephone Encounter (Signed)
Attempted ICM Intro call and unable to reach.

## 2020-06-28 NOTE — Telephone Encounter (Signed)
Attempted call back after patient left message she was returning the call.  No answer and will try back later.  Corvue turned on at last office visit to monitor fluid levels.

## 2020-07-01 NOTE — Telephone Encounter (Signed)
Attempted ICM intro call to patient and no answer.

## 2020-07-15 ENCOUNTER — Encounter: Payer: Medicare HMO | Admitting: Internal Medicine

## 2020-07-15 DIAGNOSIS — I5022 Chronic systolic (congestive) heart failure: Secondary | ICD-10-CM

## 2020-07-15 DIAGNOSIS — I480 Paroxysmal atrial fibrillation: Secondary | ICD-10-CM

## 2020-07-15 DIAGNOSIS — I472 Ventricular tachycardia: Secondary | ICD-10-CM

## 2020-07-16 NOTE — Telephone Encounter (Signed)
Unable to reach patient for ICM enrollment at this time.  91 day remote monitoring will continue per protocol.

## 2020-07-17 ENCOUNTER — Ambulatory Visit (INDEPENDENT_AMBULATORY_CARE_PROVIDER_SITE_OTHER): Payer: Medicare HMO

## 2020-07-17 DIAGNOSIS — I255 Ischemic cardiomyopathy: Secondary | ICD-10-CM

## 2020-07-26 LAB — CUP PACEART REMOTE DEVICE CHECK
Battery Remaining Longevity: 85 mo
Battery Remaining Percentage: 95.5 %
Battery Voltage: 3.2 V
Brady Statistic AP VP Percent: 8 %
Brady Statistic AP VS Percent: 79 %
Brady Statistic AS VP Percent: 2.7 %
Brady Statistic AS VS Percent: 5.9 %
Brady Statistic RA Percent Paced: 76 %
Brady Statistic RV Percent Paced: 12 %
Date Time Interrogation Session: 20220425020041
HighPow Impedance: 51 Ohm
HighPow Impedance: 51 Ohm
Implantable Lead Implant Date: 20070117
Implantable Lead Implant Date: 20070117
Implantable Lead Location: 753859
Implantable Lead Location: 753860
Implantable Lead Model: 7040
Implantable Pulse Generator Implant Date: 20220120
Lead Channel Impedance Value: 530 Ohm
Lead Channel Impedance Value: 530 Ohm
Lead Channel Pacing Threshold Amplitude: 0.5 V
Lead Channel Pacing Threshold Amplitude: 0.75 V
Lead Channel Pacing Threshold Pulse Width: 0.5 ms
Lead Channel Pacing Threshold Pulse Width: 0.5 ms
Lead Channel Sensing Intrinsic Amplitude: 11.8 mV
Lead Channel Sensing Intrinsic Amplitude: 5 mV
Lead Channel Setting Pacing Amplitude: 2.5 V
Lead Channel Setting Pacing Amplitude: 2.5 V
Lead Channel Setting Pacing Pulse Width: 0.5 ms
Lead Channel Setting Sensing Sensitivity: 0.5 mV
Pulse Gen Serial Number: 9976817

## 2020-07-29 ENCOUNTER — Other Ambulatory Visit: Payer: Self-pay | Admitting: Internal Medicine

## 2020-08-05 ENCOUNTER — Telehealth (HOSPITAL_COMMUNITY): Payer: Self-pay | Admitting: *Deleted

## 2020-08-05 NOTE — Telephone Encounter (Signed)
Received fax from [facility], pt needs clearance for teeth extractions   Per Dr Aundra Dubin pt ok to proveed, no contraintication, no antibiotic premeds required, can hold Eliquis 2 days prior  Note faxed back to them at [phone]

## 2020-08-07 NOTE — Progress Notes (Signed)
Remote ICD transmission.   

## 2020-08-14 ENCOUNTER — Encounter: Payer: Medicare HMO | Admitting: Internal Medicine

## 2020-08-14 DIAGNOSIS — I480 Paroxysmal atrial fibrillation: Secondary | ICD-10-CM

## 2020-08-14 DIAGNOSIS — I5022 Chronic systolic (congestive) heart failure: Secondary | ICD-10-CM

## 2020-09-07 ENCOUNTER — Other Ambulatory Visit: Payer: Self-pay | Admitting: Internal Medicine

## 2020-09-18 ENCOUNTER — Other Ambulatory Visit: Payer: Self-pay | Admitting: Internal Medicine

## 2020-09-20 ENCOUNTER — Ambulatory Visit: Payer: Medicare HMO | Admitting: Internal Medicine

## 2020-09-26 ENCOUNTER — Other Ambulatory Visit (HOSPITAL_COMMUNITY): Payer: Self-pay | Admitting: Unknown Physician Specialty

## 2020-09-26 MED ORDER — ROSUVASTATIN CALCIUM 40 MG PO TABS: 40.0000 mg | ORAL_TABLET | Freq: Every day | ORAL | 3 refills | Status: DC

## 2020-10-09 ENCOUNTER — Telehealth (HOSPITAL_COMMUNITY): Payer: Self-pay

## 2020-10-09 ENCOUNTER — Other Ambulatory Visit: Payer: Self-pay

## 2020-10-09 ENCOUNTER — Ambulatory Visit (HOSPITAL_COMMUNITY)
Admission: RE | Admit: 2020-10-09 | Discharge: 2020-10-09 | Disposition: A | Discharge status: Home / Self Care | Payer: Medicare HMO | Source: Ambulatory Visit | Attending: Cardiology | Admitting: Cardiology

## 2020-10-09 ENCOUNTER — Encounter (HOSPITAL_COMMUNITY): Payer: Self-pay

## 2020-10-09 ENCOUNTER — Encounter (HOSPITAL_COMMUNITY): Payer: Self-pay | Admitting: Cardiology

## 2020-10-09 VITALS — BP 98/57 | Wt 194.0 lb

## 2020-10-09 DIAGNOSIS — I5022 Chronic systolic (congestive) heart failure: Secondary | ICD-10-CM

## 2020-10-09 MED ORDER — CARVEDILOL 12.5 MG PO TABS: 12.5000 mg | ORAL_TABLET | Freq: Two times a day (BID) | ORAL | 3 refills | Status: DC

## 2020-10-09 NOTE — Telephone Encounter (Signed)
  Patient Consent for Virtual Visit        [name] has provided verbal consent on 10/09/2020 for a virtual visit (video or telephone).   CONSENT FOR VIRTUAL VISIT FOR:  [name]  By participating in this virtual visit I agree to the following:  I hereby voluntarily request, consent and authorize [facility] and its employed or contracted physicians, physician assistants, nurse practitioners or other licensed health care professionals (the Practitioner), to provide me with telemedicine health care services (the "Services") as deemed necessary by the treating Practitioner. I acknowledge and consent to receive the Services by the Practitioner via telemedicine. I understand that the telemedicine visit will involve communicating with the Practitioner through live audiovisual communication technology and the disclosure of certain medical information by electronic transmission. I acknowledge that I have been given the opportunity to request an in-person assessment or other available alternative prior to the telemedicine visit and am voluntarily participating in the telemedicine visit.  I understand that I have the right to withhold or withdraw my consent to the use of telemedicine in the course of my care at any time, without affecting my right to future care or treatment, and that the Practitioner or I may terminate the telemedicine visit at any time. I understand that I have the right to inspect all information obtained and/or recorded in the course of the telemedicine visit and may receive copies of available information for a reasonable fee.  I understand that some of the potential risks of receiving the Services via telemedicine include:  Delay or interruption in medical evaluation due to technological equipment failure or disruption; Information transmitted may not be sufficient (e.g. poor resolution of images) to allow for appropriate medical decision making by the Practitioner;  and/or  In rare instances, security protocols could fail, causing a breach of personal health information.  Furthermore, I acknowledge that it is my responsibility to provide information about my medical history, conditions and care that is complete and accurate to the best of my ability. I acknowledge that Practitioner's advice, recommendations, and/or decision may be based on factors not within their control, such as incomplete or inaccurate data provided by me or distortions of diagnostic images or specimens that may result from electronic transmissions. I understand that the practice of medicine is not an exact science and that Practitioner makes no warranties or guarantees regarding treatment outcomes. I acknowledge that a copy of this consent can be made available to me via my patient portal ([place]), or I can request a printed copy by calling the office of [facility].    I understand that my insurance will be billed for this visit.   I have read or had this consent read to me. I understand the contents of this consent, which adequately explains the benefits and risks of the Services being provided via telemedicine.  I have been provided ample opportunity to ask questions regarding this consent and the Services and have had my questions answered to my satisfaction. I give my informed consent for the services to be provided through the use of telemedicine in my medical care

## 2020-10-09 NOTE — Patient Instructions (Addendum)
No Labs done today.   DECREASE Carvedilol to 12.5mg  (1 tablet) by mouth 2 times daily.   No other medication changes were made. Please continue all current medications as prescribed.  Your physician recommends that you schedule a follow-up appointment in: 3 months with an echo prior to your exam.  If you have any questions or concerns before your next appointment please send [name] a message through [place] or call our office at [phone].    TO LEAVE A MESSAGE FOR THE NURSE SELECT OPTION 2, PLEASE LEAVE A MESSAGE INCLUDING: YOUR NAME DATE OF BIRTH CALL BACK NUMBER REASON FOR CALL**this is important as we prioritize the call backs  YOU WILL RECEIVE A CALL BACK THE SAME DAY AS LONG AS YOU CALL BEFORE 4:00 PM   Do the following things EVERYDAY: Weigh yourself in the morning before breakfast. Write it down and keep it in a log. Take your medicines as prescribed Eat low salt foods--Limit salt (sodium) to 2000 mg per day.  Stay as active as you can everyday Limit all fluids for the day to less than 2 liters   At the [facility], you and your health needs are our priority. As part of our continuing mission to provide you with exceptional heart care, we have created designated Provider Care Teams. These Care Teams include your primary Cardiologist (physician) and Advanced Practice Providers (APPs- Physician Assistants and Nurse Practitioners) who all work together to provide you with the care you need, when you need it.   You may see any of the following providers on your designated Care Team at your next follow up: Dr Glori Bickers Dr Haynes Kerns, NP Lyda Jester, [place] Audry Riles, PharmD   Please be sure to bring in all your medications bottles to every appointment.

## 2020-10-10 ENCOUNTER — Telehealth: Payer: Self-pay

## 2020-10-10 NOTE — Telephone Encounter (Signed)
-----   Message from York Ram, RN sent at 10/09/2020  5:34 PM EDT ----- My chart message sent to pt with instructions;  Check for Manual transmission 10/10/20.   ----- Message ----- From: Shonna Chock, CMA Sent: 0/11/2328  11:47 AM EDT To: [facility] Device  Hey, can you all do a remote check on this patients device please

## 2020-10-10 NOTE — Telephone Encounter (Signed)
-----   Message from York Ram, RN sent at 10/09/2020  5:34 PM EDT ----- My chart message sent to pt with instructions;  Check for Manual transmission 10/10/20.   ----- Message ----- From: Shonna Chock, CMA Sent: 0/30/1314  11:47 AM EDT To: [facility] Device  Hey, can you all do a remote check on this patients device please

## 2020-10-10 NOTE — Telephone Encounter (Signed)
Attempted to contact patient to send manual transmission. No answer, LMOVM for patient to send remote transmission. (DPR compliant).

## 2020-10-10 NOTE — Progress Notes (Signed)
Heart Failure TeleHealth Note  Due to national recommendations of social distancing due to [place] 19, Audio/video telehealth visit is felt to be most appropriate for this patient at this time.  See MyChart message from today for patient consent regarding telehealth for [facility].  Date:  10/10/2020   ID:  [name], DOB 03-05-1946, MRN [id number]  Location: Home  Provider location: [place] [facility] Type of Visit: Established patient   PCP:  Luciano Cutter, MD  Cardiologist:  Loralie Champagne, MD   History of Present Illness: [name] is a 75 y.o. female who presents via audio/video conferencing for a telehealth visit today.     She denies symptoms worrisome for COVID 19.   Patient has a history of CAD, ischemic cardiomyopathy, and atrial fibrillation.  Patient was hospitalized in 11/12 at [facility] for VT with ICD discharge.  She had a left heart cath showing patent stents.  EF was 35-40% by echo.  She is on amiodarone.  She has had periodic problems with creatinine rising with medication adjustments.  Echo in 12/18 showed EF 35-40%, inferior and septal hypokinesis, mild MR.    Echo in 5/20 showed EF 40%, normal RV, mild AI.   She did not tolerate eplerenone due to nausea.  Echo in 4/21 showed EF 40-45% with inferior/inferoseptal HK, normal RV.   In 5/22, she had COVID-19 infection.    She is currently living with her daughter in [place]. She is slowly recovering from COVID-19 about 2 months ago.  She is short of breath walking outside in the heat.  No dyspnea walking in the house.  No edema.  She does have occasional episodes of lightheadedness with standing and has had falls but no synope.  BP is running low (SBP 90s).  She did not tolerate [place] but is doing ok on Jardiance.  No arrhythmias on recent device interrogations.   Labs (10/12): LDL 73, HDL 44 Labs (11/12): K 3.9, creatinine 1.1, LFTs normal, TSH normal Labs (12/12): K 3.9, creatinine  1.5, proBNP 18 Labs (1/13): LDL 82, HDL 57 Labs (2/13): K 4.3, creatinine 1.5 Labs (5/13): creatinine 2.4 => 1.7, LFTs normal, TSH normal, proBNP 18 Labs (6/13): K 4.2, creatinine 1.7 Labs (10/13): K 4.3, creatinine 1.4 Labs (4/14); LFTs normal, TSH normal, LDL 71, HDL 58 Labs (5/14): K 4.5, creatinine 1.4 Labs (8/14): TSH normal, LFTs normal Labs (9/14): K 4.2, creatinine 1.8, LDL 75, HDL 54 Labs (07/31/13) K 3.7 Creatinine 1.8 BNP 148  Labs (9/15): K 4.3, creatinine 1.24, LFTs normal, LDL 81, HDL 58, TSH normal Labs (9/16): K 4.4, creatinine 1.44, LDL 64, HDL 54, LFTs normal, TSH normal, HCT 38 Labs (2/17): K 4.9, creatinine 1.76, HCT 38.6 Labs (1/19): LDL 71, HDL 58, K 4.8, creatinine 1.26, LFTs normal, hgb 12.9.  Labs (2/19): Creatinine 1.35, K 4.9 Labs (6/19): K 4.4, creatinine 1.57 Labs (8/19): K 5, creatinine 1.59, LFTs normal Labs (2/20): K 4.4, creatinine 1.4, LDL 72, HDL 55, normal LFTs Labs (5/20): LDL 81, K 4.8, creatinine 1.69 Labs (10/20): hgb 13.6, K 4.2, creatinine 1.62, LFTs normal, LDL 81, HDL 50 Labs (12/20): LDL 88 Labs (3/21): K 4, creatinine 1.56 Labs (5/22): K 4, creatinine 1.64 Labs (7/22): LDL 76  PMH: 1. Diabetes mellitus 2. CVA, TIA in 2010 3. HTN 4. CKD stage 3 5. H/o TAH 6. H/o CCY 7. Sciatica 8. Atrial fibrillation 9. CAD: s/p LAD and RCA PCI.  Last LHC in 11/12 with patent proximal LAD  stent, ostial 70% D1 (jailed by stent), mild LAD stent patent, patent RCA stents, EF 40% with global hypokinesis.  10. Ischemic cardiomyopathy: Echo (10/12): EF 35-40%, moderate focal basal septal hypertrophy, inferior akinesis, grade I diastolic dysfunction, moderate aortic insufficiency. St Jude dual chamber ICD.  Spironolactone stopped when creatinine rose to 2.5.  Echo (5/14) with EF 40-45%, mild AI.  Echo (6/16) with EF 40-45%, inferior/inferoseptal hypokinesis, mild LVH.  - Echo (12/18): EF 35-40%, inferior and septal hypokinesis, mild MR, mild AI.  - Echo  (5/20): EF 40%, inferior and inferoseptal hypokinesis, normal RV size and systolic function, mild AI, normal IVC.  - Unable to tolerate eplerenone.  - Echo (4/21): EF 40-45%, inferior/inferoseptal HK, normal RV, mild AI.  11. Aortic insufficiency: moderate in past but mild on most recent echo.   12. 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  13. Gastroparesis 14. Hypothyroidism 15. COVID-19 5/22  SH: Divorced.  3 children.  Quit smoking in 1997. Lives in [place] now with daughter.  [place].   FH: CAD  ROS: All systems reviewed and negative except as per HPI.   Current Outpatient Medications  Medication Sig Dispense Refill   acetaminophen (TYLENOL) 500 MG tablet Take 500 mg by mouth daily as needed for moderate pain or headache.     amiodarone (PACERONE) 100 MG tablet TAKE 1 TABLET BY MOUTH EVERY DAY 90 tablet 3   Blood Glucose Monitoring Suppl (ACCU-CHEK NANO SMARTVIEW) W/DEVICE KIT Use to test blood sugar 4 times daily as instructed. Dx code: E11.59 1 kit 0   Cholecalciferol (VITAMIN D3) 2000 UNITS TABS Take 2,000 Units by mouth daily.     dicyclomine (BENTYL) 10 MG capsule TAKE 1 CAPSULE (10 MG TOTAL) BY MOUTH 4 (FOUR) TIMES DAILY BEFORE MEALS AND AT BEDTIME. 120 capsule 1   ELIQUIS 5 MG TABS tablet TAKE 1 TABLET BY MOUTH TWICE A DAY 60 tablet 11   empagliflozin (JARDIANCE) 10 MG TABS tablet Take 10 mg by mouth daily.     ENTRESTO 49-51 MG TAKE 1 TABLET BY MOUTH 2 (TWO) TIMES DAILY. 60 tablet 6   fluticasone (FLONASE) 50 MCG/ACT nasal spray Place into the nose.     furosemide (LASIX) 20 MG tablet Take 20 mg by mouth daily.     gabapentin (NEURONTIN) 400 MG capsule TAKE 1 CAPSULE (400 MG TOTAL) BY MOUTH 2 (TWO) TIMES DAILY. 180 capsule 1   glucose blood (ACCU-CHEK SMARTVIEW) test strip Use to test blood sugar 4 times daily as instructed. Dx code E11.59 200 each 11   insulin aspart (NOVOLOG) 100 UNIT/ML injection Inject into the skin 3 (three) times daily before meals. 10-12 units      insulin detemir (LEVEMIR) 100 UNIT/ML injection Inject 25 Units into the skin at bedtime.     levothyroxine (SYNTHROID) 25 MCG tablet TAKE 1 TABLET BY MOUTH EVERY MORNING BEFORE BREAKFAST 30 tablet 0   magnesium oxide (MAG-OX) 400 (240 Mg) MG tablet TAKE 1 TABLET BY MOUTH TWICE A DAY 180 tablet 1   Multiple Vitamins-Minerals (EYE VITAMINS PO) Take 1 tablet by mouth 2 (two) times daily.      nitroGLYCERIN (NITROSTAT) 0.4 MG SL tablet Place 1 tablet (0.4 mg total) under the tongue every 5 (five) minutes as needed for chest pain. 25 tablet 1   omeprazole (PRILOSEC) 20 MG capsule TAKE 1 CAPSULE BY MOUTH EVERY DAY 90 capsule 1   rosuvastatin (CRESTOR) 40 MG tablet Take 1 tablet (40 mg total) by mouth daily. 100 tablet 3  Semaglutide,0.25 or 0.5MG/DOS, (OZEMPIC, 0.25 OR 0.5 MG/DOSE,) 2 MG/1.5ML SOPN Inject 0.5 mg into the skin once a week. 1.5 mL 0   traMADol (ULTRAM) 50 MG tablet TAKE 1 TABLET BY MOUTH EVERY 6 HOURS AS NEEDED FOR PAIN 120 tablet 2   carvedilol (COREG) 12.5 MG tablet Take 1 tablet (12.5 mg total) by mouth 2 (two) times daily with a meal. 180 tablet 3   No current facility-administered medications for this encounter.   BP (!) 98/57   Wt 88 kg (194 lb)   BMI 34.37 kg/m  (Video/Tele Health Call; Exam is subjective and or/visual.) General:  Speaks in full sentences. No resp difficulty. Lungs: Normal respiratory effort with conversation.  Abdomen: Non-distended per patient report Extremities: Pt denies edema. Neuro: Alert & oriented x 3.   Assessment/Plan 1. Atrial fibrillation: Paroxysmal.  No recent arrhythmias on device interrogations.  - Continue amiodarone 100 mg daily.  I will arrange to get LFTs checked. She is on Levoxyl followed by PCP. She will need regular eye exams.  - Continue Eliquis 5 mg bid, arrange for CBC.   2. Coronary artery disease: No chest pain.  Nonobstructive disease on last cath.  - Continue statin.  - No ASA given Eliquis use.   3. Ventricular  tachycardia - On amiodarone 100 mg daily with suppression of VT. 4. Chronic systolic CHF: Ischemic cardiomyopathy.  EF 40-45% 4/21 echo. She has a Research officer, political party ICD. NYHA class II-III symptoms.  More fatigued since she has had COVID-19. She has orthostatic symptoms at times with SBP down to 90s.  - Continue Entresto 49/51 bid.   - Decrease Coreg to 12.5 mg bid.   - Continue Lasix 20 mg daily.  Arrange for BMET.  - She has tolerated neither spironolactone nor eplerenone.  - Continue Jardiance.  - Echo at followup in 3 months.  5. CKD stage 3: Arrange BMET.    6. Hypothyroidism: Managed by PCP.  7. Hyperlipidemia: On Crestor, good lipids in 7/22.   COVID screen The patient does not have any symptoms that suggest any further testing/ screening at this time.  Social distancing reinforced today.  Patient Risk: After full review of this patients clinical status, I feel that they are at moderate risk for cardiac decompensation at this time.  Relevant cardiac medications were reviewed at length with the patient today. The patient does not have concerns regarding their medications at this time.   Recommended follow-up:  3 months with echo  Today, I have spent 16 minutes with the patient with telehealth technology discussing the above issues .    Signed, Loralie Champagne, MD  10/10/2020  [facility] [address] Heart and Vascular [place] [address] [phone] (office) [phone] (fax)

## 2020-10-11 NOTE — Telephone Encounter (Signed)
Manual transmission received- Normal device function.  AT/Af burden 5.7% pt is on [place].  No ventricular arrythmias.  Presenting rate/ Rhythm APVS 68 bpm.    Forwarding to requesting MD.

## 2020-10-16 ENCOUNTER — Ambulatory Visit (INDEPENDENT_AMBULATORY_CARE_PROVIDER_SITE_OTHER): Payer: Medicare HMO

## 2020-10-16 DIAGNOSIS — I255 Ischemic cardiomyopathy: Secondary | ICD-10-CM

## 2020-10-17 LAB — CUP PACEART REMOTE DEVICE CHECK
Battery Remaining Longevity: 84 mo
Battery Remaining Percentage: 93 %
Battery Voltage: 3.19 V
Brady Statistic AP VP Percent: 4.8 %
Brady Statistic AP VS Percent: 86 %
Brady Statistic AS VP Percent: 1.4 %
Brady Statistic AS VS Percent: 5.2 %
Brady Statistic RA Percent Paced: 82 %
Brady Statistic RV Percent Paced: 8.1 %
Date Time Interrogation Session: 20220727020022
HighPow Impedance: 51 Ohm
HighPow Impedance: 51 Ohm
Implantable Lead Implant Date: 20070117
Implantable Lead Implant Date: 20070117
Implantable Lead Location: 753859
Implantable Lead Location: 753860
Implantable Lead Model: 7040
Implantable Pulse Generator Implant Date: 20220120
Lead Channel Impedance Value: 480 Ohm
Lead Channel Impedance Value: 610 Ohm
Lead Channel Pacing Threshold Amplitude: 0.5 V
Lead Channel Pacing Threshold Amplitude: 0.75 V
Lead Channel Pacing Threshold Pulse Width: 0.5 ms
Lead Channel Pacing Threshold Pulse Width: 0.5 ms
Lead Channel Sensing Intrinsic Amplitude: 11.8 mV
Lead Channel Sensing Intrinsic Amplitude: 5 mV
Lead Channel Setting Pacing Amplitude: 2.5 V
Lead Channel Setting Pacing Amplitude: 2.5 V
Lead Channel Setting Pacing Pulse Width: 0.5 ms
Lead Channel Setting Sensing Sensitivity: 0.5 mV
Pulse Gen Serial Number: 9976817

## 2020-11-04 ENCOUNTER — Encounter: Payer: Medicare HMO | Admitting: Internal Medicine

## 2020-11-11 NOTE — Progress Notes (Signed)
Remote ICD transmission.   

## 2020-11-25 ENCOUNTER — Other Ambulatory Visit (HOSPITAL_COMMUNITY): Payer: Self-pay | Admitting: Cardiology

## 2020-12-18 ENCOUNTER — Other Ambulatory Visit: Payer: Self-pay | Admitting: Internal Medicine

## 2021-01-14 ENCOUNTER — Ambulatory Visit (HOSPITAL_BASED_OUTPATIENT_CLINIC_OR_DEPARTMENT_OTHER)
Admission: RE | Admit: 2021-01-14 | Discharge: 2021-01-14 | Disposition: A | Discharge status: Home / Self Care | Payer: Medicare HMO | Source: Ambulatory Visit

## 2021-01-14 ENCOUNTER — Other Ambulatory Visit (HOSPITAL_COMMUNITY): Payer: Self-pay | Admitting: *Deleted

## 2021-01-14 ENCOUNTER — Other Ambulatory Visit: Payer: Self-pay

## 2021-01-14 ENCOUNTER — Encounter (HOSPITAL_COMMUNITY): Payer: Self-pay | Admitting: Cardiology

## 2021-01-14 ENCOUNTER — Ambulatory Visit (HOSPITAL_COMMUNITY)
Admission: RE | Admit: 2021-01-14 | Discharge: 2021-01-14 | Disposition: A | Discharge status: Home / Self Care | Payer: Medicare HMO | Source: Ambulatory Visit | Attending: Cardiology | Admitting: Cardiology

## 2021-01-14 VITALS — BP 102/60 | HR 65 | Wt 184.0 lb

## 2021-01-14 DIAGNOSIS — I5022 Chronic systolic (congestive) heart failure: Secondary | ICD-10-CM | POA: Insufficient documentation

## 2021-01-14 DIAGNOSIS — G4730 Sleep apnea, unspecified: Secondary | ICD-10-CM | POA: Diagnosis not present

## 2021-01-14 DIAGNOSIS — E785 Hyperlipidemia, unspecified: Secondary | ICD-10-CM | POA: Insufficient documentation

## 2021-01-14 DIAGNOSIS — E039 Hypothyroidism, unspecified: Secondary | ICD-10-CM | POA: Diagnosis not present

## 2021-01-14 DIAGNOSIS — I351 Nonrheumatic aortic (valve) insufficiency: Secondary | ICD-10-CM | POA: Insufficient documentation

## 2021-01-14 DIAGNOSIS — I2510 Atherosclerotic heart disease of native coronary artery without angina pectoris: Secondary | ICD-10-CM | POA: Insufficient documentation

## 2021-01-14 LAB — COMPREHENSIVE METABOLIC PANEL
ALT: 10 U/L (ref 0–44)
AST: 17 U/L (ref 15–41)
Albumin: 3.2 g/dL — ABNORMAL LOW (ref 3.5–5.0)
Alkaline Phosphatase: 67 U/L (ref 38–126)
Anion gap: 10 (ref 5–15)
BUN: 21 mg/dL (ref 8–23)
CO2: 23 mmol/L (ref 22–32)
Calcium: 8.9 mg/dL (ref 8.9–10.3)
Chloride: 106 mmol/L (ref 98–111)
Creatinine, Ser: 1.4 mg/dL — ABNORMAL HIGH (ref 0.44–1.00)
GFR, Estimated: 40 mL/min — ABNORMAL LOW (ref >60)
Glucose, Bld: 152 mg/dL — ABNORMAL HIGH (ref 70–99)
Potassium: 4.3 mmol/L (ref 3.5–5.1)
Sodium: 139 mmol/L (ref 135–145)
Total Bilirubin: 0.6 mg/dL (ref 0.3–1.2)
Total Protein: 6.7 g/dL (ref 6.5–8.1)

## 2021-01-14 LAB — ECHOCARDIOGRAM COMPLETE
Calc EF: 33.3 %
P 1/2 time: 447 msec
S' Lateral: 3.7 cm
Single Plane A2C EF: 31.1 %
Single Plane A4C EF: 37.4 %

## 2021-01-14 LAB — TSH: TSH: 0.223 u[IU]/mL — ABNORMAL LOW (ref 0.350–4.500)

## 2021-01-14 MED ORDER — ENTRESTO 97-103 MG PO TABS: 1.0000 | ORAL_TABLET | Freq: Two times a day (BID) | ORAL | 3 refills | Status: DC

## 2021-01-14 NOTE — Progress Notes (Signed)
ID:  [name], DOB [birth year], MRN [id number]  Provider location: [place] [facility] Type of Visit: Established patient   PCP:  Luciano Cutter, MD  Cardiologist:  Loralie Champagne, MD   History of Present Illness: [name] is a 75 y.o. female who has a history of CAD, ischemic cardiomyopathy, and atrial fibrillation.  Patient was hospitalized in 11/12 at [facility] for VT with ICD discharge.  She had a left heart cath showing patent stents.  EF was 35-40% by echo.  She is on amiodarone.  She has had periodic problems with creatinine rising with medication adjustments.  Echo in 12/18 showed EF 35-40%, inferior and septal hypokinesis, mild MR.    Echo in 5/20 showed EF 40%, normal RV, mild AI.   She did not tolerate eplerenone due to nausea.  Echo in 4/21 showed EF 40-45% with inferior/inferoseptal HK, normal RV.   In 5/22, she had COVID-19 infection.    Echo was done today and reviewed, EF 40% with basal-mid inferior and inferoseptal severe hypokinesis, normal RV, mild AI.   Patient returns for followup of CHF.  She is now living in [facility].  Still feels like she has not fully recovered from COVID-19 infection earlier this year.  She has bilateral knee pain and low back pain, uses a walker.  No chest pain.  No dyspnea walking on flat ground (just has the noted orthopedic limitations).  Weight is down 10 lbs. No lightheadedness.   Labs (10/12): LDL 73, HDL 44 Labs (11/12): K 3.9, creatinine 1.1, LFTs normal, TSH normal Labs (12/12): K 3.9, creatinine 1.5, proBNP 18 Labs (1/13): LDL 82, HDL 57 Labs (2/13): K 4.3, creatinine 1.5 Labs (5/13): creatinine 2.4 => 1.7, LFTs normal, TSH normal, proBNP 18 Labs (6/13): K 4.2, creatinine 1.7 Labs (10/13): K 4.3, creatinine 1.4 Labs (4/14); LFTs normal, TSH normal, LDL 71, HDL 58 Labs (5/14): K 4.5, creatinine 1.4 Labs (8/14): TSH normal, LFTs normal Labs (9/14): K 4.2, creatinine 1.8, LDL 75, HDL 54 Labs (07/31/13)  K 3.7 Creatinine 1.8 BNP 148  Labs (9/15): K 4.3, creatinine 1.24, LFTs normal, LDL 81, HDL 58, TSH normal Labs (9/16): K 4.4, creatinine 1.44, LDL 64, HDL 54, LFTs normal, TSH normal, HCT 38 Labs (2/17): K 4.9, creatinine 1.76, HCT 38.6 Labs (1/19): LDL 71, HDL 58, K 4.8, creatinine 1.26, LFTs normal, hgb 12.9.  Labs (2/19): Creatinine 1.35, K 4.9 Labs (6/19): K 4.4, creatinine 1.57 Labs (8/19): K 5, creatinine 1.59, LFTs normal Labs (2/20): K 4.4, creatinine 1.4, LDL 72, HDL 55, normal LFTs Labs (5/20): LDL 81, K 4.8, creatinine 1.69 Labs (10/20): hgb 13.6, K 4.2, creatinine 1.62, LFTs normal, LDL 81, HDL 50 Labs (12/20): LDL 88 Labs (3/21): K 4, creatinine 1.56 Labs (5/22): K 4, creatinine 1.64 Labs (7/22): LDL 76  PMH: 1. Diabetes mellitus 2. CVA, TIA in 2010 3. HTN 4. CKD stage 3 5. H/o TAH 6. H/o CCY 7. Sciatica 8. Atrial fibrillation 9. CAD: s/p LAD and RCA PCI.  Last LHC in 11/12 with patent proximal LAD stent, ostial 70% D1 (jailed by stent), mild LAD stent patent, patent RCA stents, EF 40% with global hypokinesis.  10. Ischemic cardiomyopathy: Echo (10/12): EF 35-40%, moderate focal basal septal hypertrophy, inferior akinesis, grade I diastolic dysfunction, moderate aortic insufficiency. St Jude dual chamber ICD.  Spironolactone stopped when creatinine rose to 2.5.  Echo (5/14) with EF 40-45%, mild AI.  Echo (6/16) with EF 40-45%, inferior/inferoseptal hypokinesis, mild LVH.  -  Echo (12/18): EF 35-40%, inferior and septal hypokinesis, mild MR, mild AI.  - Echo (5/20): EF 40%, inferior and inferoseptal hypokinesis, normal RV size and systolic function, mild AI, normal IVC.  - Unable to tolerate eplerenone.  - Echo (4/21): EF 40-45%, inferior/inferoseptal HK, normal RV, mild AI.  - Echo (10/22): EF 40% with basal-mid inferior and inferoseptal severe hypokinesis, normal RV, mild AI.  11. Aortic insufficiency: moderate in past but mild on most recent echo.   12. History of VT:  on amiodarone.  13. Gastroparesis 14. Hypothyroidism 15. COVID-19 5/22 16. Depression  SH: Divorced.  3 children.  Quit smoking in 1997. Lives in [place] now with daughter.  [place].   FH: CAD  ROS: All systems reviewed and negative except as per HPI.   Current Outpatient Medications  Medication Sig Dispense Refill   acetaminophen (TYLENOL) 500 MG tablet Take 500 mg by mouth daily as needed for moderate pain or headache.     amiodarone (PACERONE) 100 MG tablet TAKE 1 TABLET BY MOUTH EVERY DAY 90 tablet 3   Blood Glucose Monitoring Suppl (ACCU-CHEK NANO SMARTVIEW) W/DEVICE KIT Use to test blood sugar 4 times daily as instructed. Dx code: E11.59 1 kit 0   busPIRone (BUSPAR) 5 MG tablet Take 5 mg by mouth 2 (two) times daily.     carvedilol (COREG) 12.5 MG tablet Take 1 tablet (12.5 mg total) by mouth 2 (two) times daily with a meal. 180 tablet 3   cetirizine (ZYRTEC) 10 MG tablet Take 10 mg by mouth daily.     Cholecalciferol (VITAMIN D3) 2000 UNITS TABS Take 2,000 Units by mouth daily.     dicyclomine (BENTYL) 10 MG capsule TAKE 1 CAPSULE (10 MG TOTAL) BY MOUTH 4 (FOUR) TIMES DAILY BEFORE MEALS AND AT BEDTIME. 120 capsule 1   Dulaglutide (TRULICITY [place]) Inject into the skin.     Dulaglutide 0.75 MG/0.5ML SOPN Inject 0.75 mg into the skin once a week.     ELIQUIS 5 MG TABS tablet TAKE 1 TABLET BY MOUTH TWICE A DAY 60 tablet 11   empagliflozin (JARDIANCE) 10 MG TABS tablet Take 10 mg by mouth daily.     fluticasone (FLONASE) 50 MCG/ACT nasal spray Place into the nose.     furosemide (LASIX) 20 MG tablet Take 20 mg by mouth daily.     gabapentin (NEURONTIN) 400 MG capsule TAKE 1 CAPSULE (400 MG TOTAL) BY MOUTH 2 (TWO) TIMES DAILY. 180 capsule 1   glucose blood (ACCU-CHEK SMARTVIEW) test strip Use to test blood sugar 4 times daily as instructed. Dx code E11.59 200 each 11   insulin aspart (NOVOLOG) 100 UNIT/ML injection Inject into the skin 3 (three) times daily before meals. 10-12  units     insulin detemir (LEVEMIR) 100 UNIT/ML injection Inject 25 Units into the skin at bedtime.     levothyroxine (SYNTHROID) 25 MCG tablet TAKE 1 TABLET BY MOUTH EVERY MORNING BEFORE BREAKFAST 30 tablet 0   magnesium oxide (MAG-OX) 400 (240 Mg) MG tablet TAKE 1 TABLET BY MOUTH TWICE A DAY 180 tablet 1   Multiple Vitamins-Minerals (EYE VITAMINS PO) Take 1 tablet by mouth 2 (two) times daily.      nitroGLYCERIN (NITROSTAT) 0.4 MG SL tablet Place 1 tablet (0.4 mg total) under the tongue every 5 (five) minutes as needed for chest pain. 25 tablet 1   omeprazole (PRILOSEC) 20 MG capsule TAKE 1 CAPSULE BY MOUTH EVERY DAY 90 capsule 1   rosuvastatin (CRESTOR) 40 MG  tablet Take 1 tablet (40 mg total) by mouth daily. 100 tablet 3   traMADol (ULTRAM) 50 MG tablet TAKE 1 TABLET BY MOUTH EVERY 6 HOURS AS NEEDED FOR PAIN 120 tablet 2   sacubitril-valsartan (ENTRESTO) 97-103 MG Take 1 tablet by mouth 2 (two) times daily. 60 tablet 3   No current facility-administered medications for this encounter.   BP 102/60   Pulse 65   Wt 83.5 kg (184 lb)   SpO2 94%   BMI 32.59 kg/m  General: NAD Neck: No JVD, no thyromegaly or thyroid nodule.  Lungs: Clear to auscultation bilaterally with normal respiratory effort. CV: Nondisplaced PMI.  Heart regular S1/S2, no S3/S4, no murmur.  No peripheral edema.  No carotid bruit.  Normal pedal pulses.  Abdomen: Soft, nontender, no hepatosplenomegaly, no distention.  Skin: Intact without lesions or rashes.  Neurologic: Alert and oriented x 3.  Psych: Normal affect. Extremities: No clubbing or cyanosis.  HEENT: Normal.   Assessment/Plan 1. Atrial fibrillation: Paroxysmal.  No recent arrhythmias on device interrogations.  - Continue amiodarone 100 mg daily.  Check LFTs and TSH. She is on Levoxyl followed by PCP. She will need regular eye exams.  - Continue Eliquis 5 mg bid.  2. Coronary artery disease: No chest pain.  Nonobstructive disease on last cath.  - Continue  statin.  - No ASA given Eliquis use.   3. Ventricular tachycardia - On amiodarone 100 mg daily with suppression of VT. 4. Chronic systolic CHF: Ischemic cardiomyopathy.  EF 40 on 10/22 echo. She has a Research officer, political party ICD. NYHA class II symptoms, limited most by orthopedic pain.  More fatigued since she has had COVID-19. Not volume overloaded on exam.   - Continue Entresto 97/103 bid (but she will confirm for [name] that she is actually taking this dose).   - Continue Coreg 12.5 mg bid.   - Continue Lasix 20 mg daily.  BMET today.  - She has tolerated neither spironolactone nor eplerenone.  - Continue Jardiance.  5. CKD stage 3: BMET today.  6. Hypothyroidism: Managed by PCP.  7. Hyperlipidemia: On Crestor, good lipids in 7/22.   Followup in 4 months with APP.   Signed, Loralie Champagne, MD  01/14/2021  [facility] [address] Heart and [place] [address] [phone] (office) [phone] (fax)

## 2021-01-14 NOTE — Progress Notes (Signed)
  Echocardiogram 2D Echocardiogram has been performed.  [name] 01/14/2021, 11:00 AM

## 2021-01-14 NOTE — Patient Instructions (Signed)
Labs done today. We will contact you only if your labs are abnormal.   Please call our office to let us know which Entresto dose you take.   No medication changes were made. Please continue all current medications as prescribed.  Your physician recommends that you schedule a follow-up appointment in: 4 months with our NP/[facility] Clinic here in our office.   If you have any questions or concerns before your next appointment please send [name] a message through [place] or call our office at [phone].    TO LEAVE A MESSAGE FOR THE NURSE SELECT OPTION 2, PLEASE LEAVE A MESSAGE INCLUDING: YOUR NAME DATE OF BIRTH CALL BACK NUMBER REASON FOR CALL**this is important as we prioritize the call backs  YOU WILL RECEIVE A CALL BACK THE SAME DAY AS LONG AS YOU CALL BEFORE 4:00 PM   Do the following things EVERYDAY: Weigh yourself in the morning before breakfast. Write it down and keep it in a log. Take your medicines as prescribed Eat low salt foods--Limit salt (sodium) to 2000 mg per day.  Stay as active as you can everyday Limit all fluids for the day to less than 2 liters   At the [facility], you and your health needs are our priority. As part of our continuing mission to provide you with exceptional heart care, we have created designated Provider Care Teams. These Care Teams include your primary Cardiologist (physician) and Advanced Practice Providers (APPs- Physician Assistants and Nurse Practitioners) who all work together to provide you with the care you need, when you need it.   You may see any of the following providers on your designated Care Team at your next follow up: Dr Glori Bickers Dr Haynes Kerns, NP Lyda Jester, [place] Audry Riles, PharmD   Please be sure to bring in all your medications bottles to every appointment.

## 2021-01-15 ENCOUNTER — Ambulatory Visit (INDEPENDENT_AMBULATORY_CARE_PROVIDER_SITE_OTHER): Payer: Medicare HMO

## 2021-01-15 DIAGNOSIS — I5022 Chronic systolic (congestive) heart failure: Secondary | ICD-10-CM

## 2021-01-15 LAB — CUP PACEART REMOTE DEVICE CHECK
Battery Remaining Longevity: 79 mo
Battery Remaining Percentage: 90 %
Battery Voltage: 3.13 V
Brady Statistic AP VP Percent: 4.5 %
Brady Statistic AP VS Percent: 84 %
Brady Statistic AS VP Percent: 1.2 %
Brady Statistic AS VS Percent: 7.7 %
Brady Statistic RA Percent Paced: 81 %
Brady Statistic RV Percent Paced: 7.5 %
Date Time Interrogation Session: 20221026035450
HighPow Impedance: 49 Ohm
HighPow Impedance: 49 Ohm
Implantable Lead Implant Date: 20070117
Implantable Lead Implant Date: 20070117
Implantable Lead Location: 753859
Implantable Lead Location: 753860
Implantable Lead Model: 7040
Implantable Pulse Generator Implant Date: 20220120
Lead Channel Impedance Value: 440 Ohm
Lead Channel Impedance Value: 510 Ohm
Lead Channel Pacing Threshold Amplitude: 0.5 V
Lead Channel Pacing Threshold Amplitude: 0.75 V
Lead Channel Pacing Threshold Pulse Width: 0.5 ms
Lead Channel Pacing Threshold Pulse Width: 0.5 ms
Lead Channel Sensing Intrinsic Amplitude: 11.5 mV
Lead Channel Sensing Intrinsic Amplitude: 3.5 mV
Lead Channel Setting Pacing Amplitude: 2.5 V
Lead Channel Setting Pacing Amplitude: 2.5 V
Lead Channel Setting Pacing Pulse Width: 0.5 ms
Lead Channel Setting Sensing Sensitivity: 0.5 mV
Pulse Gen Serial Number: 9976817

## 2021-01-20 ENCOUNTER — Telehealth: Payer: Self-pay

## 2021-01-20 NOTE — Telephone Encounter (Signed)
The patient states tech support is sending her a new monitor in 7-14 business days.

## 2021-01-20 NOTE — Progress Notes (Signed)
Remote ICD transmission.   

## 2021-01-24 ENCOUNTER — Telehealth (HOSPITAL_COMMUNITY): Payer: Self-pay

## 2021-01-24 NOTE — Telephone Encounter (Signed)
Lmtrc, letter mailed to address on file

## 2021-01-24 NOTE — Telephone Encounter (Signed)
-----   Message from Larey Dresser, MD sent at 01/14/2021  2:38 PM EDT ----- Low TSH.  She is on Levoxyl 25 mcg daily, stop for now now.  Repeat TSH with free T3 and free T4 in 1 month.

## 2021-02-12 ENCOUNTER — Encounter: Payer: Self-pay | Admitting: Gastroenterology

## 2021-02-26 ENCOUNTER — Encounter (HOSPITAL_COMMUNITY): Payer: Self-pay | Admitting: *Deleted

## 2021-02-26 ENCOUNTER — Telehealth (HOSPITAL_COMMUNITY): Payer: Self-pay | Admitting: *Deleted

## 2021-02-26 NOTE — Telephone Encounter (Signed)
Pt returned call she was advised to stp levothyroxine. Pt has appt on 12/20 with endocrinology and will have labs drawn.

## 2021-02-26 NOTE — Addendum Note (Signed)
Addended by: Harvie Junior on: 02/26/2021 12:14 PM   Modules accepted: Orders

## 2021-02-26 NOTE — Telephone Encounter (Signed)
Pt received letter to call for lab results pt left vm requesting return call. I called pt back no answer/no vm set up. Mychart message sent.

## 2021-02-27 ENCOUNTER — Ambulatory Visit: Payer: Medicare HMO | Admitting: Internal Medicine

## 2021-03-04 ENCOUNTER — Other Ambulatory Visit: Payer: Self-pay | Admitting: Internal Medicine

## 2021-03-11 ENCOUNTER — Ambulatory Visit: Payer: Medicare HMO | Admitting: Internal Medicine

## 2021-03-13 ENCOUNTER — Other Ambulatory Visit: Payer: Self-pay | Admitting: Internal Medicine

## 2021-03-18 ENCOUNTER — Other Ambulatory Visit (HOSPITAL_COMMUNITY): Payer: Self-pay | Admitting: Cardiology

## 2021-03-18 ENCOUNTER — Other Ambulatory Visit: Payer: Self-pay | Admitting: Internal Medicine

## 2021-03-18 DIAGNOSIS — I5022 Chronic systolic (congestive) heart failure: Secondary | ICD-10-CM

## 2021-03-21 ENCOUNTER — Other Ambulatory Visit (HOSPITAL_COMMUNITY): Payer: Self-pay | Admitting: Cardiology

## 2021-03-21 DIAGNOSIS — I5022 Chronic systolic (congestive) heart failure: Secondary | ICD-10-CM

## 2021-04-02 ENCOUNTER — Encounter: Payer: Self-pay | Admitting: Internal Medicine

## 2021-04-02 ENCOUNTER — Other Ambulatory Visit: Payer: Self-pay

## 2021-04-02 ENCOUNTER — Ambulatory Visit (INDEPENDENT_AMBULATORY_CARE_PROVIDER_SITE_OTHER): Payer: Medicare HMO | Admitting: Internal Medicine

## 2021-04-02 VITALS — BP 100/58 | HR 69 | Ht 63.0 in | Wt 179.0 lb

## 2021-04-02 DIAGNOSIS — E039 Hypothyroidism, unspecified: Secondary | ICD-10-CM | POA: Diagnosis not present

## 2021-04-02 DIAGNOSIS — E1142 Type 2 diabetes mellitus with diabetic polyneuropathy: Secondary | ICD-10-CM | POA: Diagnosis not present

## 2021-04-02 DIAGNOSIS — E1165 Type 2 diabetes mellitus with hyperglycemia: Secondary | ICD-10-CM | POA: Diagnosis not present

## 2021-04-02 DIAGNOSIS — E785 Hyperlipidemia, unspecified: Secondary | ICD-10-CM

## 2021-04-02 DIAGNOSIS — Z23 Encounter for immunization: Secondary | ICD-10-CM

## 2021-04-02 DIAGNOSIS — E1159 Type 2 diabetes mellitus with other circulatory complications: Secondary | ICD-10-CM | POA: Diagnosis not present

## 2021-04-02 LAB — POCT GLYCOSYLATED HEMOGLOBIN (HGB A1C): Hemoglobin A1C: 6.7 % — AB (ref 4.0–5.6)

## 2021-04-02 LAB — TSH: TSH: 2.13 u[IU]/mL (ref 0.35–5.50)

## 2021-04-02 LAB — T4, FREE: Free T4: 1.08 ng/dL (ref 0.60–1.60)

## 2021-04-02 LAB — T3, FREE: T3, Free: 2.9 pg/mL (ref 2.3–4.2)

## 2021-04-02 MED ORDER — TRULICITY 1.5 MG/0.5ML ~~LOC~~ SOAJ: 1.5000 mg | SUBCUTANEOUS | 3 refills | Status: DC

## 2021-04-02 MED ORDER — NOVOLOG FLEXPEN 100 UNIT/ML ~~LOC~~ SOPN: PEN_INJECTOR | SUBCUTANEOUS | 11 refills | Status: DC

## 2021-04-02 MED ORDER — LEVEMIR FLEXTOUCH 100 UNIT/ML ~~LOC~~ SOPN: 20.0000 [IU] | PEN_INJECTOR | Freq: Every day | SUBCUTANEOUS | 11 refills | Status: DC

## 2021-04-02 NOTE — Progress Notes (Signed)
Patient ID: [name], female   DOB: 1945/07/17, 76 y.o.   MRN: [id number]  This visit occurred during the SARS-CoV-2 public health emergency.  Safety protocols were in place, including screening questions prior to the visit, additional usage of staff PPE, and extensive cleaning of exam room while observing appropriate contact time as indicated for disinfecting solutions.   HPI: [name] is a 76 y.o.-year-old female, presenting for f/u for DM2, insulin-dependent, uncontrolled, with complications (CAD, ICM - had ICD, peripheral neuropathy, gastroparesis). Last visit 10 months ago. Since last OV, she moved away, then moved to [place].  Interim history: Since last visit, see if she saw endocrinology at [facility], with the last office visit in 09/2020.  Her diabetic regimen was changed (Trulicity and Jardiance added).  Sugars are now much better. Before last visit, her nephrologist recommended to stop metformin, which we did at our last visit.   Before last visit, she was getting steroid injections -sugars are very high. No increased urination, blurry vision, nausea, chest pain.  She feels that her leg swelling is better.  Reviewed HbA1c levels: 11/08/2020: HbA1c 8.5% 09/25/2020: HbA1c 10.3% 05/13/2020: HbA1c 9.6% 01/22/2020: HbA1c 8% 03/13/2019: HbA1c 7.1% Lab Results  Component Value Date   HGBA1C 7.7 (A) 11/23/2018   HGBA1C 7.6 (H) 08/16/2018   HGBA1C 6.1 (A) 02/25/2018  09/26/2020: Glucose 197, C-peptide 7.76 (1.1-4.4) At last visit she was on: - Metformin XR 1000 mg with dinner - Levemir 35 units at bedtime - Novolog 10-16 units before meals (use the higher dose after a steroid inj) She was previously on Farxiga but stopped due to side effects (somnolence?)  Now on: - Jardiance 10 mg before breakfast - tolerated well - Levemir 25 units at bedtime - Novolog 7 >> 5 units before meals - Trulicity 7.61 mg weekly in a.m. - no nausea  Pt checks her sugars 3-4  times a day: - am: 86, 110-182, 213, 257 >> 75, 110-182, but 210-385 (steroids) >> 84-140, 156, 187 - after b'fast: 175 >> n/c - before lunch: 126-310 (steroid inj) >> 81, 116-432, 515 >> 85-138, 142, 191 - after lunch:  45, 47, 140-150 >> n/c - before dinner: 120-180, 352 >> ? >> 71, 130-410 >> 88-135 - bedtime: 87-258 >> 115-210 >> 140-202 >> 110-360 >> 91-185, 216, 221 Lowest sugar was 86 >> 58 >> 50s at night ;  she has hypoglycemia awareness in the 90s. Highest sugar was 310 >> 515 (steroid) >> 221.  -+ CKD, last BUN/creatinine:   Lab Results  Component Value Date   BUN 21 01/14/2021   CREATININE 1.40 (H) 01/14/2021   GFR low -she sees nephrology: Lab Results  Component Value Date   GFRNONAA 40 (L) 01/14/2021   GFRNONAA 36 (L) 04/01/2020   GFRNONAA 29 (L) 09/29/2019   GFRNONAA 32 (L) 01/10/2019   GFRNONAA 28 (L) 08/16/2018   ACR was high at last check: 09/26/2020: ACR 55.3 Lab Results  Component Value Date   MICRALBCREAT 5.3 08/16/2018   MICRALBCREAT 9.4 04/08/2017   MICRALBCREAT 3.2 06/25/2016   MICRALBCREAT 1.6 12/21/2014   MICRALBCREAT 1.0 12/15/2012  On Entresto.  -+ HL; last set of lipids: 08/28/2020: 145/117/63/72 Lab Results  Component Value Date   CHOL 135 09/29/2019   HDL 56 09/29/2019   LDLCALC 56 09/29/2019   TRIG 116 09/29/2019   CHOLHDL 2.4 09/29/2019  On Crestor 20 >> 40.  - last eye exam was in 2018: No DR  - she has numbness  and tingling in her feet-on Neurontin.  She is on Eliquis. On amiodarone.  She has mild hypothyroidism; latest TSH was slightly low: Lab Results  Component Value Date   TSH 0.223 (L) 01/14/2021   She was on levothyroxine 25 mcg daily -stopped by PCP when the above results returned.  She has no family history of medullary thyroid cancer or personal history of pancreatitis. Her 75 y/o son passed away in 2018-06-26 (had cirrhosis >> developed cough, fever, generalized pain+ bilateral PNA (Covid 19 negative).   Her nephew  died in 07-27-2018 and her brother died 2018/11/26.    ROS: + see HPI Neurological: no tremors/+ numbness/+ tingling/no dizziness  I reviewed pt's medications, allergies, PMH, social hx, family hx, and changes were documented in the history of present illness. Otherwise, unchanged from my initial visit note.  Past Medical History:  Diagnosis Date   Allergy    Arthritis    Atrial fibrillation (HCC)    controlled with amiodarone, on coumadin   Chronic renal insufficiency    Chronic systolic heart failure (HCC)    Congestive heart failure (HCC)    Coronary artery disease 01/31/2011   Diabetes mellitus    type 1   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    History of chicken pox    Hyperlipidemia    Hypertension    Ischemic cardiomyopathy    Lumbar spondylosis 01/11/2012   Sleep apnea    Stroke [facility]) 2010   eye doctor said she had TIA   Ventricular tachycardia    Polymorphic   Past Surgical History:  Procedure Laterality Date   ABDOMINAL HYSTERECTOMY  1995   CARDIAC DEFIBRILLATOR PLACEMENT     CHOLECYSTECTOMY  1985   COLONOSCOPY WITH PROPOFOL N/A 02/24/2018   Procedure: COLONOSCOPY WITH PROPOFOL;  Surgeon: Rachael Fee, MD;  Location: [facility] ENDOSCOPY;  Service: Endoscopy;  Laterality: N/A;   ICD  202007-04-05   implanted by Dr Alanda Amass, most recent generator change 2/13 by Dr Johney Frame, Analyze ST study patient   ICD GENERATOR CHANGEOUT N/A 04/11/2020   Procedure: ICD GENERATOR CHANGEOUT;  Surgeon: Hillis Range, MD;  Location: [facility] INVASIVE CV LAB;  Service: Cardiovascular;  Laterality: N/A;   IMPLANTABLE CARDIOVERTER DEFIBRILLATOR (ICD) GENERATOR CHANGE N/A 05/05/2011   Procedure: ICD GENERATOR CHANGE;  Surgeon: Hillis Range, MD;  Location: [facility] CATH LAB;  Service: Cardiovascular;  Laterality: N/A;   LEFT HEART CATHETERIZATION WITH CORONARY ANGIOGRAM N/A 01/30/2011   Procedure: LEFT HEART CATHETERIZATION WITH CORONARY ANGIOGRAM;  Surgeon: Laurey Morale, MD;  Location: [facility] CATH LAB;   Service: Cardiovascular;  Laterality: N/A;   POLYPECTOMY  02/24/2018   Procedure: POLYPECTOMY;  Surgeon: Rachael Fee, MD;  Location: [facility] ENDOSCOPY;  Service: Endoscopy;;   TUBALIGATION  1980   Social History   Socioeconomic History   Marital status: Divorced    Spouse name: Not on file   Number of children: 3   Years of education: 12   Highest education level: Not on file  Occupational History   Occupation: Retried  Tobacco Use   Smoking status: Former    Types: Cigarettes    Quit date: 08/16/1995    Years since quitting: 25.6   Smokeless tobacco: Never  Vaping Use   Vaping Use: Never used  Substance and Sexual Activity   Alcohol use: No   Drug use: No   Sexual activity: Never    Birth control/protection: Post-menopausal  Other Topics Concern   Not on file  Social History Narrative  Regular exercise-no   Caffeine Use-no   Social Determinants of Health   Financial Resource Strain: Not on file  Food Insecurity: Not on file  Transportation Needs: Not on file  Physical Activity: Not on file  Stress: Not on file  Social Connections: Not on file  Intimate Partner Violence: Not on file   Current Outpatient Medications on File Prior to Visit  Medication Sig Dispense Refill   acetaminophen (TYLENOL) 500 MG tablet Take 500 mg by mouth daily as needed for moderate pain or headache.     amiodarone (PACERONE) 100 MG tablet TAKE 1 TABLET BY MOUTH EVERY DAY 90 tablet 3   Blood Glucose Monitoring Suppl (ACCU-CHEK NANO SMARTVIEW) W/DEVICE KIT Use to test blood sugar 4 times daily as instructed. Dx code: E11.59 1 kit 0   busPIRone (BUSPAR) 5 MG tablet Take 5 mg by mouth 2 (two) times daily.     carvedilol (COREG) 12.5 MG tablet Take 1 tablet (12.5 mg total) by mouth 2 (two) times daily with a meal. 180 tablet 3   cetirizine (ZYRTEC) 10 MG tablet Take 10 mg by mouth daily.     Cholecalciferol (VITAMIN D3) 2000 UNITS TABS Take 2,000 Units by mouth daily.     dicyclomine (BENTYL)  10 MG capsule TAKE 1 CAPSULE (10 MG TOTAL) BY MOUTH 4 (FOUR) TIMES DAILY BEFORE MEALS AND AT BEDTIME. 120 capsule 1   Dulaglutide (TRULICITY [place]) Inject into the skin.     ELIQUIS 5 MG TABS tablet TAKE 1 TABLET BY MOUTH TWICE A DAY 60 tablet 11   empagliflozin (JARDIANCE) 10 MG TABS tablet Take 10 mg by mouth daily.     fluticasone (FLONASE) 50 MCG/ACT nasal spray Place into the nose.     furosemide (LASIX) 20 MG tablet TAKE 2 TABLETS BY MOUTH DAILY FOR 3 DAYS, THEN 1 DAILY AFTER THAT 90 tablet 3   gabapentin (NEURONTIN) 400 MG capsule TAKE 1 CAPSULE (400 MG TOTAL) BY MOUTH 2 (TWO) TIMES DAILY. 180 capsule 1   glucose blood (ACCU-CHEK SMARTVIEW) test strip Use to test blood sugar 4 times daily as instructed. Dx code E11.59 200 each 11   insulin aspart (NOVOLOG) 100 UNIT/ML injection Inject into the skin 3 (three) times daily before meals. 10-12 units     insulin detemir (LEVEMIR) 100 UNIT/ML injection Inject 25 Units into the skin at bedtime.     magnesium oxide (MAG-OX) 400 (240 Mg) MG tablet TAKE 1 TABLET BY MOUTH TWICE A DAY 180 tablet 1   Multiple Vitamins-Minerals (EYE VITAMINS PO) Take 1 tablet by mouth 2 (two) times daily.      nitroGLYCERIN (NITROSTAT) 0.4 MG SL tablet Place 1 tablet (0.4 mg total) under the tongue every 5 (five) minutes as needed for chest pain. 25 tablet 1   omeprazole (PRILOSEC) 20 MG capsule TAKE 1 CAPSULE BY MOUTH EVERY DAY 90 capsule 1   rosuvastatin (CRESTOR) 40 MG tablet Take 1 tablet (40 mg total) by mouth daily. 100 tablet 3   sacubitril-valsartan (ENTRESTO) 97-103 MG Take 1 tablet by mouth 2 (two) times daily. 60 tablet 3   traMADol (ULTRAM) 50 MG tablet TAKE 1 TABLET BY MOUTH EVERY 6 HOURS AS NEEDED FOR PAIN 683 tablet 2   TRULICITY 4.19 QQ/2.2LN SOPN INJECT 0.5 ML UNDER THE SKIN PER WEEK. 2 mL 0   No current facility-administered medications on file prior to visit.   Allergies  Allergen Reactions   Veltassa [Patiromer] Diarrhea   [place] Other (See  Comments)  indigestion   Promethazine Hcl Other (See Comments)    hyperactivity   Spironolactone Diarrhea and Nausea And Vomiting   Plavix [Clopidogrel Bisulfate] Rash   Family History  Problem Relation Age of Onset   Diabetes Mother    Heart disease Mother    Hyperlipidemia Mother    Hypertension Mother    Heart disease Father    Heart attack Father    Hypertension Father    Early death Brother 84   Breast cancer Sister    Lung cancer Sister    Irritable bowel syndrome Sister    Colon cancer Maternal Aunt    Esophageal cancer Neg Hx    Colon polyps Neg Hx    PE: There were no vitals taken for this visit. There is no height or weight on file to calculate BMI.  Wt Readings from Last 3 Encounters:  04/02/21 179 lb (81.2 kg)  01/14/21 184 lb (83.5 kg)  10/09/20 194 lb (88 kg)   Constitutional: overweight, in NAD Eyes: PERRLA, EOMI, no exophthalmos ENT: moist mucous membranes, no thyromegaly, no cervical lymphadenopathy Cardiovascular: RRR, No MRG Respiratory: CTA B Musculoskeletal: no deformities, strength intact in all 4 Skin: moist, warm, no rashes Neurological: no tremor with outstretched hands, DTR normal in all 4  ASSESSMENT: 1. DM2, insulin-dependent, controlled, with complications - CAD - s/p stent - ICM with CHF - s/p ICD placement - A fib - s/p pacemaker - CKD - PN - on neurontin - gastroparesis per [facility] 06/18/2012 >> 60 minutes: 92%, 120 minutes: 82%  2. 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 HL  4.  Hypothyroidism  PLAN:  1. Patient with longstanding, previously uncontrolled type 2 diabetes, on metformin and basal-bolus insulin regimen in the past, Tileshia advised him to add weekly GLP-1 receptor agonist.  At that time, HbA1c was 9.6%, much higher.  Her blood sugars fluctuating between 60s and 500s.  She had steroid injections before last visit.  Of note, she had side effects from SGLT2 inh. in the past (somnolence) but at last visit we decided to try a GLP-1 receptor  agonist despite her history of gastroparesis, since we absolutely needed better control of her diabetes.  We had to stop metformin at that time due to her low kidney function.  I also advised her to start drinking milk, which she was doing at night.  This possibly the reason why her sugars are high in the morning.  I also advised her to stop sweet tea. -However, after our last visit 10 months ago, she was lost for follow-up.  She saw another endocrinologist in [place] and her diabetic regimen was changed.  Her insulin doses were decreased, she was started on Jardiance and Trulicity, both low doses.  Most recent HbA1c available for review today is 8.5% in 10/2020, previously 10.3% in 09/2020. -At this visit, she tells me that she tolerates well both Jardiance and Trulicity.  She is afraid that she may not be able to avoid them once she gets into the donut hole, but for now, she would like to continue them.  She is also happy that she lost weight (approximately 20 pounds). -As of now, sugars appear to be mostly at goal, with spikes above the target range in the evening.  We discussed about possibly increasing the dose of Trulicity since he is using the lowest dose.  She agrees with this.  I am hoping that she can back off even more on NovoLog and even, after the increase in  GLP-1 receptor agonist dose.  As she describes a low blood sugar, in the 50s, at night, I advised her to also try to decrease the Levemir dose at that time.  We will continue the Jardiance at the same dose for now. - I suggested to: Patient Instructions  Please continue: - Jardiance 10 mg before breakfast - Novolog 5 units before meals  Please increase: - Trulicity 1.5 mg weekly in a.m.  Please decrease: - Levemir 20 units at bedtime  Please return in 3 months with your sugar log.   - we checked her HbA1c: 6.7% (MUCH better) - advised to check sugars at different times of the day - 4x a day, rotating check times - advised for  yearly eye exams >> she is not UTD - return to clinic in 3 months   2. PN -No new complaints -She continues on Neurontin-no side effects  3. HL -Reviewed latest lipid panel from 08/28/2020: Fractions at goal with the exception of a slightly high LDL, at 72 -Continues on Crestor 40 mg daily without side effects.  4.  Hypothyroidism -Previously managed by PCP -Possibly due to amiodarone -She has been on levothyroxine 25 mcg daily but the TSH returned suppressed in 12/2020 so she was advised to stop it -We will recheck her TFTs today, off levothyroxine  + Flu shot today  Component     Latest Ref Rng & Units 04/02/2021          TSH     0.35 - 5.50 uIU/mL 2.13  Triiodothyronine,Free,Serum     2.3 - 4.2 pg/mL 2.9  T4,Free(Direct)     0.60 - 1.60 ng/dL 1.08  Normal TFTs.  Philemon Kingdom, MD PhD [facility] Endocrinology

## 2021-04-02 NOTE — Patient Instructions (Addendum)
Please continue: - Jardiance 10 mg before breakfast - Novolog 5 units before meals  Please increase: - Trulicity 1.5 mg weekly in a.m.  Please decrease: - Levemir 20 units at bedtime  Please return in 3 months with your sugar log.

## 2021-04-08 ENCOUNTER — Other Ambulatory Visit (HOSPITAL_COMMUNITY): Payer: Self-pay | Admitting: Cardiology

## 2021-04-08 DIAGNOSIS — I5022 Chronic systolic (congestive) heart failure: Secondary | ICD-10-CM

## 2021-04-10 ENCOUNTER — Other Ambulatory Visit (HOSPITAL_COMMUNITY): Payer: Self-pay | Admitting: Cardiology

## 2021-04-10 DIAGNOSIS — I5022 Chronic systolic (congestive) heart failure: Secondary | ICD-10-CM

## 2021-04-16 ENCOUNTER — Ambulatory Visit (INDEPENDENT_AMBULATORY_CARE_PROVIDER_SITE_OTHER): Payer: Medicare HMO

## 2021-04-16 DIAGNOSIS — I255 Ischemic cardiomyopathy: Secondary | ICD-10-CM

## 2021-04-16 LAB — CUP PACEART REMOTE DEVICE CHECK
Battery Remaining Longevity: 78 mo
Battery Remaining Percentage: 88 %
Battery Voltage: 3.1 V
Brady Statistic AP VP Percent: 4.6 %
Brady Statistic AP VS Percent: 83 %
Brady Statistic AS VP Percent: 1.2 %
Brady Statistic AS VS Percent: 8.6 %
Brady Statistic RA Percent Paced: 81 %
Brady Statistic RV Percent Paced: 7.6 %
Date Time Interrogation Session: 20230125020015
HighPow Impedance: 46 Ohm
HighPow Impedance: 46 Ohm
Implantable Lead Implant Date: 20070117
Implantable Lead Implant Date: 20070117
Implantable Lead Location: 753859
Implantable Lead Location: 753860
Implantable Lead Model: 7040
Implantable Pulse Generator Implant Date: 20220120
Lead Channel Impedance Value: 440 Ohm
Lead Channel Impedance Value: 590 Ohm
Lead Channel Pacing Threshold Amplitude: 0.5 V
Lead Channel Pacing Threshold Amplitude: 0.75 V
Lead Channel Pacing Threshold Pulse Width: 0.5 ms
Lead Channel Pacing Threshold Pulse Width: 0.5 ms
Lead Channel Sensing Intrinsic Amplitude: 11.8 mV
Lead Channel Sensing Intrinsic Amplitude: 4.2 mV
Lead Channel Setting Pacing Amplitude: 2.5 V
Lead Channel Setting Pacing Amplitude: 2.5 V
Lead Channel Setting Pacing Pulse Width: 0.5 ms
Lead Channel Setting Sensing Sensitivity: 0.5 mV
Pulse Gen Serial Number: 9976817

## 2021-04-25 ENCOUNTER — Telehealth: Payer: Self-pay

## 2021-04-25 NOTE — Telephone Encounter (Signed)
LVM for patient to call device clinic regarding ICD alert for ATP therapies   Abbott alert 1 NSVT 04/24/21 @ 16:31 x 4 sec. 2 consecutive VT events 04/24/21 @ 16:43 & 16:44. Both 179 bpm.  ATP x2 slowed VT rate below detection. Re organized @ 179 bpm with ATP x1 successfully returning rhythm to AS/VP.

## 2021-04-28 NOTE — Telephone Encounter (Signed)
Spoke with patient.  She reports being ill Wednesday/ Thursday of last week.  Symptoms included No energy, headache and nausea.  She denies missing any doses of medications including Amiodarone and Carvedilol.    Currently patient feels well, she denies any current cardiac symptoms.    Educated patient on DMV driving restrictions, patient states she does not drive.     Patient is due for inclinic appt in March.  Advised I wil;l have scheduling contact her to set up appt in March for APP.

## 2021-04-28 NOTE — Progress Notes (Signed)
Remote ICD transmission.   

## 2021-05-20 ENCOUNTER — Encounter (HOSPITAL_COMMUNITY): Payer: Self-pay

## 2021-05-20 ENCOUNTER — Other Ambulatory Visit: Payer: Self-pay

## 2021-05-20 ENCOUNTER — Ambulatory Visit (HOSPITAL_COMMUNITY)
Admission: RE | Admit: 2021-05-20 | Discharge: 2021-05-20 | Disposition: A | Discharge status: Home / Self Care | Payer: Medicare HMO | Source: Ambulatory Visit | Attending: Family Medicine | Admitting: Family Medicine

## 2021-05-20 VITALS — BP 100/60 | HR 63 | Ht 63.0 in | Wt 174.8 lb

## 2021-05-20 DIAGNOSIS — N183 Chronic kidney disease, stage 3 unspecified: Secondary | ICD-10-CM

## 2021-05-20 DIAGNOSIS — Z95810 Presence of automatic (implantable) cardiac defibrillator: Secondary | ICD-10-CM | POA: Insufficient documentation

## 2021-05-20 DIAGNOSIS — I472 Ventricular tachycardia, unspecified: Secondary | ICD-10-CM | POA: Diagnosis not present

## 2021-05-20 DIAGNOSIS — E1122 Type 2 diabetes mellitus with diabetic chronic kidney disease: Secondary | ICD-10-CM | POA: Insufficient documentation

## 2021-05-20 DIAGNOSIS — Z7901 Long term (current) use of anticoagulants: Secondary | ICD-10-CM | POA: Insufficient documentation

## 2021-05-20 DIAGNOSIS — I2510 Atherosclerotic heart disease of native coronary artery without angina pectoris: Secondary | ICD-10-CM

## 2021-05-20 DIAGNOSIS — Z79899 Other long term (current) drug therapy: Secondary | ICD-10-CM | POA: Insufficient documentation

## 2021-05-20 DIAGNOSIS — E039 Hypothyroidism, unspecified: Secondary | ICD-10-CM

## 2021-05-20 DIAGNOSIS — Z7984 Long term (current) use of oral hypoglycemic drugs: Secondary | ICD-10-CM | POA: Diagnosis not present

## 2021-05-20 DIAGNOSIS — I255 Ischemic cardiomyopathy: Secondary | ICD-10-CM | POA: Diagnosis not present

## 2021-05-20 DIAGNOSIS — I480 Paroxysmal atrial fibrillation: Secondary | ICD-10-CM

## 2021-05-20 DIAGNOSIS — Z8616 Personal history of COVID-19: Secondary | ICD-10-CM | POA: Diagnosis not present

## 2021-05-20 DIAGNOSIS — E785 Hyperlipidemia, unspecified: Secondary | ICD-10-CM | POA: Diagnosis not present

## 2021-05-20 DIAGNOSIS — I5022 Chronic systolic (congestive) heart failure: Secondary | ICD-10-CM | POA: Diagnosis not present

## 2021-05-20 DIAGNOSIS — I340 Nonrheumatic mitral (valve) insufficiency: Secondary | ICD-10-CM | POA: Diagnosis not present

## 2021-05-20 DIAGNOSIS — E7849 Other hyperlipidemia: Secondary | ICD-10-CM

## 2021-05-20 DIAGNOSIS — I130 Hypertensive heart and chronic kidney disease with heart failure and stage 1 through stage 4 chronic kidney disease, or unspecified chronic kidney disease: Secondary | ICD-10-CM | POA: Insufficient documentation

## 2021-05-20 LAB — BASIC METABOLIC PANEL
Anion gap: 11 (ref 5–15)
BUN: 35 mg/dL — ABNORMAL HIGH (ref 8–23)
CO2: 23 mmol/L (ref 22–32)
Calcium: 9.1 mg/dL (ref 8.9–10.3)
Chloride: 104 mmol/L (ref 98–111)
Creatinine, Ser: 1.91 mg/dL — ABNORMAL HIGH (ref 0.44–1.00)
GFR, Estimated: 27 mL/min — ABNORMAL LOW (ref >60)
Glucose, Bld: 133 mg/dL — ABNORMAL HIGH (ref 70–99)
Potassium: 4.4 mmol/L (ref 3.5–5.1)
Sodium: 138 mmol/L (ref 135–145)

## 2021-05-20 LAB — MAGNESIUM: Magnesium: 2.4 mg/dL (ref 1.7–2.4)

## 2021-05-20 MED ORDER — NITROGLYCERIN 0.4 MG SL SUBL: 0.4000 mg | SUBLINGUAL_TABLET | SUBLINGUAL | 1 refills | Status: DC | PRN

## 2021-05-20 MED ORDER — FUROSEMIDE 20 MG PO TABS: 20.0000 mg | ORAL_TABLET | Freq: Every day | ORAL | 3 refills | Status: DC

## 2021-05-20 MED ORDER — BUSPIRONE HCL 5 MG PO TABS: 5.0000 mg | ORAL_TABLET | Freq: Two times a day (BID) | ORAL | 1 refills | Status: DC

## 2021-05-20 NOTE — Progress Notes (Signed)
ID:  [name], DOB Aug 08, 1945, MRN [id number]  Provider location: [place] [facility] Type of Visit: Established patient   PCP:  Luciano Cutter, MD  HF Cardiologist:  Loralie Champagne, MD   History of Present Illness: [name] is a 76 y.o. female who has a history of CAD, ischemic cardiomyopathy, and atrial fibrillation.  Patient was hospitalized in 11/12 at [facility] for VT with ICD discharge.  She had a left heart cath showing patent stents.  EF was 35-40% by echo.  She is on amiodarone.  She has had periodic problems with creatinine rising with medication adjustments.  Echo in 12/18 showed EF 35-40%, inferior and septal hypokinesis, mild MR.    Echo in 5/20 showed EF 40%, normal RV, mild AI.   She did not tolerate eplerenone due to nausea.  Echo in 4/21 showed EF 40-45% with inferior/inferoseptal HK, normal RV.   In 5/22, she had COVID-19 infection.    Echo 10/22 EF 40% with basal-mid inferior and inferoseptal severe hypokinesis, normal RV, mild AI.   Notified by [facility] 04/24/21 that she had VT, with shock.    Today she returns for HF follow up. Overall feeling fine. She is SOB with stairs and has occasional dizziness during hot weather. Had 1 episode of pre-syncope about 1 month ago, she says her blood sugars were very elevated and she was very stressed. Of note, this was around the time she was notified by device clinic of VT.  Denies palpitations, abnormal bleeding, CP, abnormal bleeding, edema, or PND/Orthopnea. Appetite ok. No fever or chills. She does not weigh at home. Taking all medications.   ECG (personally reviewed): A-paced with PVC 66 bpm   Device interrogation (personally reviewed): CoreVue stable, A-paced 80%, V-paced 7.9%, 2 hours day/activity  Labs (10/12): LDL 73, HDL 44 Labs (11/12): K 3.9, creatinine 1.1, LFTs normal, TSH normal Labs (12/12): K 3.9, creatinine 1.5, proBNP 18 Labs (1/13): LDL 82, HDL 57 Labs (2/13): K 4.3, creatinine  1.5 Labs (5/13): creatinine 2.4 => 1.7, LFTs normal, TSH normal, proBNP 18 Labs (6/13): K 4.2, creatinine 1.7 Labs (10/13): K 4.3, creatinine 1.4 Labs (4/14); LFTs normal, TSH normal, LDL 71, HDL 58 Labs (5/14): K 4.5, creatinine 1.4 Labs (8/14): TSH normal, LFTs normal Labs (9/14): K 4.2, creatinine 1.8, LDL 75, HDL 54 Labs (07/31/13) K 3.7 Creatinine 1.8 BNP 148  Labs (9/15): K 4.3, creatinine 1.24, LFTs normal, LDL 81, HDL 58, TSH normal Labs (9/16): K 4.4, creatinine 1.44, LDL 64, HDL 54, LFTs normal, TSH normal, HCT 38 Labs (2/17): K 4.9, creatinine 1.76, HCT 38.6 Labs (1/19): LDL 71, HDL 58, K 4.8, creatinine 1.26, LFTs normal, hgb 12.9.  Labs (2/19): Creatinine 1.35, K 4.9 Labs (6/19): K 4.4, creatinine 1.57 Labs (8/19): K 5, creatinine 1.59, LFTs normal Labs (2/20): K 4.4, creatinine 1.4, LDL 72, HDL 55, normal LFTs Labs (5/20): LDL 81, K 4.8, creatinine 1.69 Labs (10/20): hgb 13.6, K 4.2, creatinine 1.62, LFTs normal, LDL 81, HDL 50 Labs (12/20): LDL 88 Labs (3/21): K 4, creatinine 1.56 Labs (5/22): K 4, creatinine 1.64 Labs (7/22): LDL 76 Labs (10/22): K 4.3, creatinine 1.40, LFTs normal, TSH low (Levoxyl stopped).  PMH: 1. Diabetes mellitus 2. CVA, TIA in 2010 3. HTN 4. CKD stage 3 5. H/o TAH 6. H/o CCY 7. Sciatica 8. Atrial fibrillation 9. CAD: s/p LAD and RCA PCI.  Last LHC in 11/12 with patent proximal LAD stent, ostial 70% D1 (jailed by stent), mild  LAD stent patent, patent RCA stents, EF 40% with global hypokinesis.  10. Ischemic cardiomyopathy: Echo (10/12): EF 35-40%, moderate focal basal septal hypertrophy, inferior akinesis, grade I diastolic dysfunction, moderate aortic insufficiency. St Jude dual chamber ICD.  Spironolactone stopped when creatinine rose to 2.5.  Echo (5/14) with EF 40-45%, mild AI.  Echo (6/16) with EF 40-45%, inferior/inferoseptal hypokinesis, mild LVH.  - Echo (12/18): EF 35-40%, inferior and septal hypokinesis, mild MR, mild AI.  - Echo  (5/20): EF 40%, inferior and inferoseptal hypokinesis, normal RV size and systolic function, mild AI, normal IVC.  - Unable to tolerate eplerenone.  - Echo (4/21): EF 40-45%, inferior/inferoseptal HK, normal RV, mild AI.  - Echo (10/22): EF 40% with basal-mid inferior and inferoseptal severe hypokinesis, normal RV, mild AI.  11. Aortic insufficiency: moderate in past but mild on most recent echo.   12. 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  13. Gastroparesis 14. Hypothyroidism 15. COVID-19 5/22 16. Depression  SH: Divorced.  3 children.  Quit smoking in 1997. Lives in [place] now with daughter.  [place].   FH: CAD  ROS: All systems reviewed and negative except as per HPI.   Current Outpatient Medications  Medication Sig Dispense Refill   acetaminophen (TYLENOL) 500 MG tablet Take 500 mg by mouth daily as needed for moderate pain or headache.     amiodarone (PACERONE) 100 MG tablet TAKE 1 TABLET BY MOUTH EVERY DAY 90 tablet 3   Blood Glucose Monitoring Suppl (ACCU-CHEK NANO SMARTVIEW) W/DEVICE KIT Use to test blood sugar 4 times daily as instructed. Dx code: E11.59 1 kit 0   busPIRone (BUSPAR) 5 MG tablet Take 5 mg by mouth 2 (two) times daily.     carvedilol (COREG) 12.5 MG tablet Take 1 tablet (12.5 mg total) by mouth 2 (two) times daily with a meal. 180 tablet 3   cetirizine (ZYRTEC) 10 MG tablet Take 10 mg by mouth daily.     Cholecalciferol (VITAMIN D3) 2000 UNITS TABS Take 2,000 Units by mouth daily.     dicyclomine (BENTYL) 10 MG capsule TAKE 1 CAPSULE (10 MG TOTAL) BY MOUTH 4 (FOUR) TIMES DAILY BEFORE MEALS AND AT BEDTIME. 120 capsule 1   Dulaglutide (TRULICITY) 1.5 JA/2.5KN SOPN Inject 1.5 mg into the skin once a week. 12 mL 3   ELIQUIS 5 MG TABS tablet TAKE 1 TABLET BY MOUTH TWICE A DAY 60 tablet 11   empagliflozin (JARDIANCE) 10 MG TABS tablet Take 10 mg by mouth daily.     fluticasone (FLONASE) 50 MCG/ACT nasal spray Place into the nose.     furosemide (LASIX) 20 MG tablet  TAKE 2 TABLETS BY MOUTH DAILY FOR 3 DAYS, THEN 1 DAILY AFTER THAT (Patient taking differently: Take 20 mg by mouth daily.) 90 tablet 3   gabapentin (NEURONTIN) 400 MG capsule TAKE 1 CAPSULE (400 MG TOTAL) BY MOUTH 2 (TWO) TIMES DAILY. 180 capsule 1   glucose blood (ACCU-CHEK SMARTVIEW) test strip Use to test blood sugar 4 times daily as instructed. Dx code E11.59 200 each 11   insulin aspart (NOVOLOG FLEXPEN) 100 UNIT/ML FlexPen Inject up to 15 units daily under skin as advised 15 mL 11   insulin detemir (LEVEMIR FLEXTOUCH) 100 UNIT/ML FlexPen Inject 20 Units into the skin daily. 15 mL 11   magnesium oxide (MAG-OX) 400 (240 Mg) MG tablet TAKE 1 TABLET BY MOUTH TWICE A DAY 180 tablet 1   Multiple Vitamins-Minerals (EYE VITAMINS PO) Take 1 tablet by mouth 2 (two)  times daily.      nitroGLYCERIN (NITROSTAT) 0.4 MG SL tablet Place 1 tablet (0.4 mg total) under the tongue every 5 (five) minutes as needed for chest pain. 25 tablet 1   omeprazole (PRILOSEC) 20 MG capsule TAKE 1 CAPSULE BY MOUTH EVERY DAY 90 capsule 1   rosuvastatin (CRESTOR) 40 MG tablet Take 1 tablet (40 mg total) by mouth daily. 100 tablet 3   sacubitril-valsartan (ENTRESTO) 97-103 MG Take 1 tablet by mouth 2 (two) times daily. 60 tablet 3   traMADol (ULTRAM) 50 MG tablet TAKE 1 TABLET BY MOUTH EVERY 6 HOURS AS NEEDED FOR PAIN 120 tablet 2   No current facility-administered medications for this encounter.   Wt Readings from Last 3 Encounters:  05/20/21 79.3 kg (174 lb 12.8 oz)  04/02/21 81.2 kg (179 lb)  01/14/21 83.5 kg (184 lb)   BP 100/60    Pulse 63    Ht _0  (1.6 m)    Wt 79.3 kg (174 lb 12.8 oz)    SpO2 95%    BMI 30.96 kg/m  General:  NAD. No resp difficulty HEENT: Normal Neck: Supple. No JVD. Carotids 2+ bilat; no bruits. No lymphadenopathy or thryomegaly appreciated. Cor: PMI nondisplaced. Regular rate & rhythm. No rubs, gallops or murmurs. Lungs: Clear Abdomen: Obese, nontender, nondistended. No hepatosplenomegaly.  No bruits or masses. Good bowel sounds. Extremities: No cyanosis, clubbing, rash, edema Neuro: Alert & oriented x 3, cranial nerves grossly intact. Moves all 4 extremities w/o difficulty. Affect pleasant.  Assessment/Plan 1. Atrial fibrillation: Paroxysmal.  A-paced on ECG today. - Continue amiodarone 100 mg daily.  LFTs (10/22) and TSH (1/23) ok. She will need regular eye exams.  - Continue Eliquis 5 mg bid. No bleeding issues. 2. Coronary artery disease: No chest pain.  Nonobstructive disease on last cath.  - Continue statin.  - No ASA given Eliquis use.   3. Ventricular tachycardia: Had VT a couple weeks ago with ATP.  - On amiodarone 100 mg daily. - She has EP follow up next week. - Check Mag today. 4. Chronic systolic CHF: Ischemic cardiomyopathy.  EF 40% on 10/22 echo. She has a [employer], [employer] ICD. NYHA class II symptoms, limited most by orthopedic pain.  More fatigued since she has had COVID-19. Not volume overloaded on exam.   - Continue Entresto 97/103 bid. - Continue Coreg 12.5 mg bid.   - Continue Lasix 20 mg daily.  BMET today.  - She has tolerated neither spironolactone nor eplerenone.  - Continue Jardiance 10 mg daily.  - I asked her to weigh daily. 5. CKD stage 3: BMET today.  6. Hypothyroidism: Managed by PCP. Now off levothyroxine. 7. Hyperlipidemia: On Crestor, good lipids in 7/22.   Followup in 4 months with Dr. Aundra Dubin. She was gifted a scale today.  Signed, Rafael Bihari, FNP  05/20/2021  [facility] [place] [address] [facility] [place] [address] [phone] (office) [phone] (fax)

## 2021-05-20 NOTE — Patient Instructions (Signed)
Medication Changes:  None   Lab Work:  Labs done today, your results will be available in MyChart, we will contact you for abnormal readings.   Testing/Procedures:  none  Referrals:  none  Special Instructions // Education:  none  Follow-Up in: 4 months with Dr. Aundra Dubin  At the [facility], you and your health needs are our priority. We have a designated team specialized in the treatment of Heart Failure. This Care Team includes your primary Heart Failure Specialized Cardiologist (physician), Advanced Practice Providers (APPs- Physician Assistants and Nurse Practitioners), and Pharmacist who all work together to provide you with the care you need, when you need it.   You may see any of the following providers on your designated Care Team at your next follow up:  Dr Glori Bickers Dr Haynes Kerns, NP Lyda Jester, [place] [facility] [place], [place] Audry Riles, PharmD   Please be sure to bring in all your medications bottles to every appointment.   Need to Contact us:  If you have any questions or concerns before your next appointment please send [name] a message through [place] or call our office at [phone].    TO LEAVE A MESSAGE FOR THE NURSE SELECT OPTION 2, PLEASE LEAVE A MESSAGE INCLUDING: YOUR NAME DATE OF BIRTH CALL BACK NUMBER REASON FOR CALL**this is important as we prioritize the call backs  YOU WILL RECEIVE A CALL BACK THE SAME DAY AS LONG AS YOU CALL BEFORE 4:00 PM

## 2021-05-21 ENCOUNTER — Encounter (HOSPITAL_COMMUNITY): Payer: Self-pay

## 2021-05-21 DIAGNOSIS — I5022 Chronic systolic (congestive) heart failure: Secondary | ICD-10-CM

## 2021-05-23 ENCOUNTER — Telehealth (HOSPITAL_COMMUNITY): Payer: Self-pay

## 2021-05-23 NOTE — Telephone Encounter (Addendum)
Pt aware, agreeable, and verbalized understanding ?Lab appointment scheduled  ? ?----- Message from Rafael Bihari, FNP sent at 05/20/2021  4:46 PM EST ----- ?Kidney function elevated from baseline. Decrease Entresto to 49/51 bid. Repeat BMET in 10-14 days. ?

## 2021-05-23 NOTE — Telephone Encounter (Signed)
-----   Message from Rafael Bihari, [place] sent at 05/20/2021  4:46 PM EST ----- ?Kidney function elevated from baseline. Decrease Entresto to 49/51 bid. Repeat BMET in 10-14 days. ?

## 2021-05-25 NOTE — Progress Notes (Unsigned)
Cardiology Office Note Date:  05/25/2021  Patient ID:  [name], [name] 02/21/1946, MRN [id number] PCP:  Excell Seltzer, MD  Cardiologist:  Dr. Shirlee Latch EP: Dr. Johney Frame     Chief Complaint:   *** 6 mo  History of Present Illness: [name] is a 76 y.o. female with history of CAD (s/p LAD and RCA PCI.  Last LHC in 11/12 with patent proximal LAD stent, ostial 70% D1 (jailed by stent), mild LAD stent patent, patent RCA stents), ICM, chronic CHF, VT, ICD, AFib, stroke, DM, HTN, HLD, CKD (III), gastroparesis, Hypothyroidism.  She comes in today to be seen for Dr. Johney Frame, last seen by him march 2022 to revisit wound, noted she had been non-compliant with her scheduled post implant wound check (perhaps transportation, lives hours away) with retained stitch that was successfully removed that day, started on Keflex with plans for a wound check in a week with RN and again in 2 weeks with him.  I do not see any visits with EP after this, though has followed with HF team  Device clinic alert for VT w/ATP on 04/24/21 NSVT episodes seemed to have rates wobbling right in/out of detection MMVT treated with ATP x2 > transient slowing under detection >> rate back into detection ATP > SR with a dirty break  She saw Dr. Shirlee Latch 05/20/21, discussed device therapy for VT noted, about that time she had a presyncopal event she attributed to high BS and significant personal stress.  Generally feeling OK,, planned check mag level and see EP .  No changes were made.  Not felt to be volume OL, mostly limited by her orthopedic issues, class II HF symptoms. Labs  K+ 4.4 Mag 2.4 Creat 1.91  04/02/21 TSH 2.13  01/14/21 LFTs wnl   *** pocket ok?  Symptoms of illness? *** increase amio and reduce VT rate *** no driving 33mo *** volume *** needs a new EP *** eliquis, bleeding, 5mg , labs are utd    Device information SJM dual chamber ICD, implanted 2003,  2007, gen changes 2013, Jan 2022 2003 had  syncope >> EP study with inducible PMVT + h/o PMVT 2012 with ICD shocks 04/24/21 appropriate tx for VT w/HV tx  At the time of her gen change Jan 2022,  SJM [address] ST 7040 lead was fluoroscopically and electrically normal today, opted to use this lead rather than replacing it after a long discussion with the patient prior to the procedure.   AAD Hx 2007 amiodarone started quickly stopped 2/2 nausea Remotely as well tried on Sotalol and Multaq unclear when/why they were stopped 2012 restarted on amiodarone   Past Medical History:  Diagnosis Date   Allergy    Arthritis    Atrial fibrillation (HCC)    controlled with amiodarone, on coumadin   Chronic renal insufficiency    Chronic systolic heart failure (HCC)    Congestive heart failure (HCC)    Coronary artery disease 01/31/2011   Diabetes mellitus    type 1   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    History of chicken pox    Hyperlipidemia    Hypertension    Ischemic cardiomyopathy    Lumbar spondylosis 01/11/2012   Sleep apnea    Stroke [facility]) 2010   eye doctor said she had TIA   Ventricular tachycardia    Polymorphic    Past Surgical History:  Procedure Laterality Date   ABDOMINAL HYSTERECTOMY  1995   CARDIAC DEFIBRILLATOR PLACEMENT  CHOLECYSTECTOMY  1985   COLONOSCOPY WITH PROPOFOL N/A 02/24/2018   Procedure: COLONOSCOPY WITH PROPOFOL;  Surgeon: Milus Banister, MD;  Location: [facility] ENDOSCOPY;  Service: Endoscopy;  Laterality: N/A;   ICD  2003/2007   implanted by Dr Rollene Fare, most recent generator change 2/13 by Dr Rayann Heman, Analyze ST study patient   ICD GENERATOR CHANGEOUT N/A 04/11/2020   Procedure: [place];  Surgeon: Thompson Grayer, MD;  Location: [place] CV LAB;  Service: Cardiovascular;  Laterality: N/A;   IMPLANTABLE CARDIOVERTER DEFIBRILLATOR (ICD) GENERATOR CHANGE N/A 05/05/2011   Procedure: ICD GENERATOR CHANGE;  Surgeon: Thompson Grayer, MD;  Location: [facility] CATH LAB;  Service:  Cardiovascular;  Laterality: N/A;   LEFT HEART CATHETERIZATION WITH CORONARY ANGIOGRAM N/A 01/30/2011   Procedure: LEFT HEART CATHETERIZATION WITH CORONARY ANGIOGRAM;  Surgeon: Larey Dresser, MD;  Location: [facility] CATH LAB;  Service: Cardiovascular;  Laterality: N/A;   POLYPECTOMY  02/24/2018   Procedure: POLYPECTOMY;  Surgeon: Milus Banister, MD;  Location: [facility] ENDOSCOPY;  Service: Endoscopy;;   TUBALIGATION  1980    Current Outpatient Medications  Medication Sig Dispense Refill   acetaminophen (TYLENOL) 500 MG tablet Take 500 mg by mouth daily as needed for moderate pain or headache.     amiodarone (PACERONE) 100 MG tablet TAKE 1 TABLET BY MOUTH EVERY DAY 90 tablet 3   Blood Glucose Monitoring Suppl (ACCU-CHEK NANO SMARTVIEW) W/DEVICE KIT Use to test blood sugar 4 times daily as instructed. Dx code: E11.59 1 kit 0   busPIRone (BUSPAR) 5 MG tablet Take 1 tablet (5 mg total) by mouth 2 (two) times daily. 60 tablet 1   carvedilol (COREG) 12.5 MG tablet Take 1 tablet (12.5 mg total) by mouth 2 (two) times daily with a meal. 180 tablet 3   cetirizine (ZYRTEC) 10 MG tablet Take 10 mg by mouth daily.     Cholecalciferol (VITAMIN D3) 2000 UNITS TABS Take 2,000 Units by mouth daily.     dicyclomine (BENTYL) 10 MG capsule TAKE 1 CAPSULE (10 MG TOTAL) BY MOUTH 4 (FOUR) TIMES DAILY BEFORE MEALS AND AT BEDTIME. 120 capsule 1   Dulaglutide (TRULICITY) 1.5 CW/2.3JS SOPN Inject 1.5 mg into the skin once a week. 12 mL 3   ELIQUIS 5 MG TABS tablet TAKE 1 TABLET BY MOUTH TWICE A DAY 60 tablet 11   empagliflozin (JARDIANCE) 10 MG TABS tablet Take 10 mg by mouth daily.     fluticasone (FLONASE) 50 MCG/ACT nasal spray Place into the nose.     furosemide (LASIX) 20 MG tablet Take 1 tablet (20 mg total) by mouth daily. 30 tablet 3   gabapentin (NEURONTIN) 400 MG capsule TAKE 1 CAPSULE (400 MG TOTAL) BY MOUTH 2 (TWO) TIMES DAILY. 180 capsule 1   glucose blood (ACCU-CHEK SMARTVIEW) test strip Use to test blood sugar 4  times daily as instructed. Dx code E11.59 200 each 11   insulin aspart (NOVOLOG FLEXPEN) 100 UNIT/ML FlexPen Inject up to 15 units daily under skin as advised 15 mL 11   insulin detemir (LEVEMIR FLEXTOUCH) 100 UNIT/ML FlexPen Inject 20 Units into the skin daily. 15 mL 11   magnesium oxide (MAG-OX) 400 (240 Mg) MG tablet TAKE 1 TABLET BY MOUTH TWICE A DAY 180 tablet 1   Multiple Vitamins-Minerals (EYE VITAMINS PO) Take 1 tablet by mouth 2 (two) times daily.      nitroGLYCERIN (NITROSTAT) 0.4 MG SL tablet Place 1 tablet (0.4 mg total) under the tongue every 5 (five) minutes as needed  for chest pain. 25 tablet 1   omeprazole (PRILOSEC) 20 MG capsule TAKE 1 CAPSULE BY MOUTH EVERY DAY 90 capsule 1   rosuvastatin (CRESTOR) 40 MG tablet Take 1 tablet (40 mg total) by mouth daily. 100 tablet 3   sacubitril-valsartan (ENTRESTO) 97-103 MG Take 1 tablet by mouth 2 (two) times daily. 60 tablet 3   traMADol (ULTRAM) 50 MG tablet TAKE 1 TABLET BY MOUTH EVERY 6 HOURS AS NEEDED FOR PAIN 120 tablet 2   No current facility-administered medications for this visit.    Allergies:   Veltassa [patiromer], Darvon, Promethazine hcl, Spironolactone, and Plavix [clopidogrel bisulfate]   Social History:  The patient  reports that she quit smoking about 25 years ago. Her smoking use included cigarettes. She has never used smokeless tobacco. She reports that she does not drink alcohol and does not use drugs.   Family History:  The patient's family history includes Breast cancer in her sister; Colon cancer in her maternal aunt; Diabetes in her mother; Early death (age of onset: 49) in her brother; Heart attack in her father; Heart disease in her father and mother; Hyperlipidemia in her mother; Hypertension in her father and mother; Irritable bowel syndrome in her sister; Lung cancer in her sister.  ROS:  Please see the history of present illness. All other systems are reviewed and otherwise negative.   PHYSICAL EXAM:  VS:   There were no vitals taken for this visit. BMI: There is no height or weight on file to calculate BMI. Well nourished, well developed, in no acute distress  HEENT: normocephalic, atraumatic  Neck: no JVD, carotid bruits or masses Cardiac:  *** RRR; no significant murmurs, no rubs, or gallops Lungs:  *** CTA b/l, no wheezing, rhonchi or rales  Abd: soft, nontender MS: no deformity or atrophy Ext:  *** Trace edema L>R Skin: warm and dry, no rash Neuro:  No gross deficits appreciated Psych: euthymic mood, full affect  ICD site ***   EKG: not done today  ICD interrogation done today and reviewed by myself;  ***   06/27/2019: TTE IMPRESSIONS   1. Left ventricular ejection fraction, by estimation, is 40 to 45%. The  left ventricle has mildly decreased function. The left ventricle  demonstrates regional wall motion abnormalities, infeiror and inferoseptal  hypokinesis. There is mild left  ventricular hypertrophy. Left ventricular diastolic parameters are  consistent with Grade I diastolic dysfunction (impaired relaxation).   2. Right ventricular systolic function is normal. The right ventricular  size is normal. Tricuspid regurgitation signal is inadequate for assessing  PA pressure.   3. Left atrial size was mildly dilated.   4. The mitral valve is normal in structure. No evidence of mitral valve  regurgitation. No evidence of mitral stenosis.   5. The aortic valve is tricuspid. Aortic valve regurgitation is mild. No  aortic stenosis is present.   6. The inferior vena cava is normal in size with greater than 50%  respiratory variability, suggesting right atrial pressure of 3 mmHg.    08/16/2018: TTE IMPRESSIONS   1. The left ventricle has a visually estimated ejection fraction of 40%.  The cavity size was normal. There is moderately increased left ventricular  wall thickness. Left ventricular diastolic Doppler parameters are  consistent with impaired relaxation.  Inferior and  inferoseptal severe hypokinesis.   2. The right ventricle has normal systolic function. The cavity was  normal. There is no increase in right ventricular wall thickness.   3. Left atrial size  was mildly dilated.   4. There is mild to moderate mitral annular calcification present. No  evidence of mitral valve stenosis. No significant regurgitation.   5. The aortic valve is tricuspid. Moderate calcification of the aortic  valve. Aortic valve regurgitation is mild by color flow Doppler. No  stenosis of the aortic valve.   6. The aortic root is normal in size and structure.   7. Normal IVC. No complete TR doppler jet so unable to estimate PA  systolic pressure.    Recent Labs: 05/29/2020: Hemoglobin 12.8; Platelets 189 01/14/2021: ALT 10 04/02/2021: TSH 2.13 05/20/2021: BUN 35; Creatinine, Ser 1.91; Magnesium 2.4; Potassium 4.4; Sodium 138  No results found for requested labs within last 8760 hours.   Estimated Creatinine Clearance: 25.8 mL/min (A) (by C-G formula based on SCr of 1.91 mg/dL (H)).   Wt Readings from Last 3 Encounters:  05/20/21 174 lb 12.8 oz (79.3 kg)  04/02/21 179 lb (81.2 kg)  01/14/21 184 lb (83.5 kg)     Other studies reviewed: Additional studies/records reviewed today include: summarized above  ASSESSMENT AND PLAN:  1. ICD     ***  2. ICM 3. Chronic CHF (systolic)     *** Has had some subsequent improvement in LVEF 40-45% by last year's echo     Follows closely with Dr. Aundra Dubin and the HF team  4. CAD      *** No symptoms of angina      Follows with Dr. Aundra Dubin  5. Paroxysmal Afib     CHA2DS2Vasc is 9, on Eliquis, *** appropriately dosed     *** % burden  6. HTN     *** No changes today  7. HLD     *** Not addressed today  8. Hx of PMVT     *** On low dose amiodarone     ***   Disposition: ***    Current medicines are reviewed at length with the patient today.  The patient did not have any concerns regarding medicines.  Venetia Night, PA-C 05/25/2021 10:37 AM     [facility] [place] [address] [phone] (office)  [phone] (fax)

## 2021-05-28 ENCOUNTER — Encounter: Payer: Medicare HMO | Admitting: Physician Assistant

## 2021-06-02 ENCOUNTER — Other Ambulatory Visit (HOSPITAL_COMMUNITY): Payer: Self-pay | Admitting: Family Medicine

## 2021-06-02 ENCOUNTER — Other Ambulatory Visit: Payer: Self-pay | Admitting: Internal Medicine

## 2021-06-02 DIAGNOSIS — I5022 Chronic systolic (congestive) heart failure: Secondary | ICD-10-CM

## 2021-06-06 ENCOUNTER — Other Ambulatory Visit (HOSPITAL_COMMUNITY): Payer: Self-pay | Admitting: Cardiology

## 2021-06-10 ENCOUNTER — Ambulatory Visit (HOSPITAL_COMMUNITY)
Admission: RE | Admit: 2021-06-10 | Discharge: 2021-06-10 | Disposition: A | Discharge status: Home / Self Care | Payer: Medicare HMO | Source: Ambulatory Visit | Attending: Cardiology | Admitting: Cardiology

## 2021-06-10 ENCOUNTER — Other Ambulatory Visit: Payer: Self-pay

## 2021-06-10 DIAGNOSIS — I5022 Chronic systolic (congestive) heart failure: Secondary | ICD-10-CM | POA: Diagnosis present

## 2021-06-10 LAB — BASIC METABOLIC PANEL
Anion gap: 11 (ref 5–15)
BUN: 27 mg/dL — ABNORMAL HIGH (ref 8–23)
CO2: 22 mmol/L (ref 22–32)
Calcium: 8.8 mg/dL — ABNORMAL LOW (ref 8.9–10.3)
Chloride: 108 mmol/L (ref 98–111)
Creatinine, Ser: 2.15 mg/dL — ABNORMAL HIGH (ref 0.44–1.00)
GFR, Estimated: 24 mL/min — ABNORMAL LOW (ref >60)
Glucose, Bld: 156 mg/dL — ABNORMAL HIGH (ref 70–99)
Potassium: 4.5 mmol/L (ref 3.5–5.1)
Sodium: 141 mmol/L (ref 135–145)

## 2021-06-26 NOTE — Progress Notes (Signed)
? ?Cardiology Office Note ?Date:  06/27/2021  ?Patient ID:  [name], [name] [birth year], MRN [id number] ?PCP:  No primary care provider on file.  ?Cardiologist:  Dr. Aundra Dubin ?EP: Dr. Rayann Heman ? ? ?  ?Chief Complaint:    6 mo ? ?History of Present Illness: ?[name] is a 76 y.o. female with history of CAD (s/p LAD and RCA PCI.  Last LHC in 11/12 with patent proximal LAD stent, ostial 70% D1 (jailed by stent), mild LAD stent patent, patent RCA stents), ICM, chronic CHF, VT, ICD, AFib, stroke, DM, HTN, HLD, CKD (III), gastroparesis, Hypothyroidism. ? ?She comes in today to be seen for Dr. Rayann Heman, last seen by him March 2022 to revisit wound, noted she had been non-compliant with her scheduled post implant wound check (perhaps transportation, lives hours away) with retained stitch that was successfully removed that day, started on Keflex with plans for a wound check in a week with RN and again in 2 weeks with him. ? ?I do not see any visits with EP after this, though has followed with HF team ? ?Device clinic alert for VT w/ATP on 04/24/21 ?NSVT episodes seemed to have rates wobbling right in/out of detection ?MMVT treated with ATP x2 > transient slowing under detection >> rate back into detection ATP > SR with a dirty break ? ?She saw Dr. Aundra Dubin 05/20/21, discussed device therapy for VT noted, about that time she had a presyncopal event she attributed to high BS and significant personal stress.  Generally feeling OK,, planned check mag level and see EP .  No changes were made.  Not felt to be volume OL, mostly limited by her orthopedic issues, class II HF symptoms. ?Labs  ?K+ 4.4 ?Mag 2.4 ?Creat 1.91 ? ?04/02/21 ?TSH 2.13 ? ?01/14/21 ?LFTs wnl ? ? ? ?TODAY ?She is doing well. ?Since her current medication regime, she has lost some weight, and not had any difficulties with water retention/swelling ?No CP, palpitations or cardiac awareness ?No dizzy spells, near syncope or syncope ?No CP ?No bleeding or signs of  bleeding ? ? 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 ?At the time of her gen change Jan 2022,  ?SJM Ri[address] ST 7040 lead was fluoroscopically and electrically normal today, opted to use this lead rather than replacing it after a long discussion with the patient prior to the procedure. ? ? ?AAD Hx ?2007 amiodarone started quickly stopped 2/2 nausea ?Remotely as well tried on Sotalol and Multaq unclear when/why they were stopped ?2012 restarted on amiodarone ? ? ?Past Medical History:  ?Diagnosis Date  ? Allergy   ? Arthritis   ? Atrial fibrillation ([place])   ? controlled with amiodarone, on coumadin  ? Chronic renal insufficiency   ? Chronic systolic heart failure ([place])   ? Congestive heart failure ([place])   ? Coronary artery disease 01/31/2011  ? Diabetes mellitus   ? type 1  ? 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   ? History of chicken pox   ? Hyperlipidemia   ? Hypertension   ? Ischemic cardiomyopathy   ? Lumbar spondylosis 01/11/2012  ? Sleep apnea   ? Stroke [facility]) 2010  ? eye doctor said she had TIA  ? Ventricular tachycardia ([place])   ? Polymorphic  ? ? ?Past Surgical History:  ?  Procedure Laterality Date  ? ABDOMINAL HYSTERECTOMY  1995  ? CARDIAC DEFIBRILLATOR PLACEMENT    ? CHOLECYSTECTOMY  1985  ? COLONOSCOPY WITH PROPOFOL N/A 02/24/2018  ? Procedure: COLONOSCOPY WITH PROPOFOL;  Surgeon: Milus Banister, MD;  Location: [facility] ENDOSCOPY;  Service: Endoscopy;  Laterality: N/A;  ? ICD  2003/2007  ? implanted by Dr Rollene Fare, most recent generator change 2/13 by Dr Rayann Heman, Analyze ST study patient  ? ICD GENERATOR CHANGEOUT N/A 04/11/2020  ? Procedure: ICD GENERATOR CHANGEOUT;  Surgeon: Thompson Grayer, MD;  Location: [place] CV LAB;  Service: Cardiovascular;  Laterality: N/A;  ? IMPLANTABLE CARDIOVERTER DEFIBRILLATOR (ICD) GENERATOR CHANGE  N/A 05/05/2011  ? Procedure: ICD GENERATOR CHANGE;  Surgeon: Thompson Grayer, MD;  Location: [facility] CATH LAB;  Service: Cardiovascular;  Laterality: N/A;  ? LEFT HEART CATHETERIZATION WITH CORONARY ANGIOGRAM N/A 01/30/2011  ? Procedure: LEFT HEART CATHETERIZATION WITH CORONARY ANGIOGRAM;  Surgeon: Larey Dresser, MD;  Location: [facility] CATH LAB;  Service: Cardiovascular;  Laterality: N/A;  ? POLYPECTOMY  02/24/2018  ? Procedure: POLYPECTOMY;  Surgeon: Milus Banister, MD;  Location: [facility];  Service: Endoscopy;;  ? [place]  ? ? ?Current Outpatient Medications  ?Medication Sig Dispense Refill  ? acetaminophen (TYLENOL) 500 MG tablet Take 500 mg by mouth daily as needed for moderate pain or headache.    ? amiodarone (PACERONE) 100 MG tablet TAKE 1 TABLET BY MOUTH EVERY DAY 90 tablet 3  ? Blood Glucose Monitoring Suppl (ACCU-CHEK NANO SMARTVIEW) W/DEVICE KIT Use to test blood sugar 4 times daily as instructed. Dx code: E11.59 1 kit 0  ? busPIRone (BUSPAR) 5 MG tablet Take 1 tablet (5 mg total) by mouth 2 (two) times daily. 60 tablet 1  ? carvedilol (COREG) 12.5 MG tablet Take 1 tablet (12.5 mg total) by mouth 2 (two) times daily with a meal. 180 tablet 3  ? cetirizine (ZYRTEC) 10 MG tablet Take 10 mg by mouth daily.    ? Cholecalciferol (VITAMIN D3) 2000 UNITS TABS Take 2,000 Units by mouth daily.    ? dicyclomine (BENTYL) 10 MG capsule TAKE 1 CAPSULE (10 MG TOTAL) BY MOUTH 4 (FOUR) TIMES DAILY BEFORE MEALS AND AT BEDTIME. 120 capsule 1  ? Dulaglutide (TRULICITY) 1.5 TF/5.7DU SOPN Inject 1.5 mg into the skin once a week. 12 mL 3  ? ELIQUIS 5 MG TABS tablet TAKE 1 TABLET BY MOUTH TWICE A DAY 60 tablet 11  ? empagliflozin (JARDIANCE) 10 MG TABS tablet Take 10 mg by mouth daily.    ? fluticasone (FLONASE) 50 MCG/ACT nasal spray Place into the nose.    ? furosemide (LASIX) 20 MG tablet Take 1 tablet (20 mg total) by mouth daily. 30 tablet 3  ? gabapentin (NEURONTIN) 400 MG capsule TAKE 1 CAPSULE (400 MG TOTAL) BY MOUTH  2 (TWO) TIMES DAILY. 180 capsule 1  ? glucose blood (ACCU-CHEK SMARTVIEW) test strip Use to test blood sugar 4 times daily as instructed. Dx code E11.59 200 each 11  ? insulin aspart (NOVOLOG FLEXPEN) 100 UNIT/ML FlexPen Inject up to 15 units daily under skin as advised 15 mL 11  ? insulin detemir (LEVEMIR FLEXTOUCH) 100 UNIT/ML FlexPen Inject 20 Units into the skin daily. 15 mL 11  ? magnesium oxide (MAG-OX) 400 (240 Mg) MG tablet TAKE 1 TABLET BY MOUTH TWICE A DAY 180 tablet 1  ? Multiple Vitamins-Minerals (EYE VITAMINS PO) Take 1 tablet by mouth 2 (two) times daily.     ? nitroGLYCERIN (NITROSTAT) 0.4  MG SL tablet Place 1 tablet (0.4 mg total) under the tongue every 5 (five) minutes as needed for chest pain. 25 tablet 1  ? omeprazole (PRILOSEC) 20 MG capsule TAKE 1 CAPSULE BY MOUTH EVERY DAY 90 capsule 1  ? rosuvastatin (CRESTOR) 40 MG tablet Take 1 tablet (40 mg total) by mouth daily. 100 tablet 3  ? sacubitril-valsartan (ENTRESTO) 97-103 MG Take 1 tablet by mouth 2 (two) times daily. 60 tablet 3  ? traMADol (ULTRAM) 50 MG tablet TAKE 1 TABLET BY MOUTH EVERY 6 HOURS AS NEEDED FOR PAIN 120 tablet 2  ? ?No current facility-administered medications for this visit.  ? ? ?Allergies:   Veltassa [patiromer], Darvon, Promethazine hcl, Spironolactone, and Plavix [clopidogrel bisulfate]  ? ?Social History:  The patient  reports that she quit smoking about 25 years ago. Her smoking use included cigarettes. She has never used smokeless tobacco. She reports that she does not drink alcohol and does not use drugs.  ? ?Family History:  The patient's family history includes Breast cancer in her sister; Colon cancer in her maternal aunt; Diabetes in her mother; Early death (age of onset: 56) in her brother; Heart attack in her father; Heart disease in her father and mother; Hyperlipidemia in her mother; Hypertension in her father and mother; Irritable bowel syndrome in her sister; Lung cancer in her sister. ? ?ROS:  Please see  the history of present illness. All other systems are reviewed and otherwise negative.  ? ?PHYSICAL EXAM:  ?VS:  Ht $R'5\' 3"'vH$  (1.6 m)   BMI 30.96 kg/m?  BMI: Body mass index is 30.96 kg/m?. ?Well nourished

## 2021-06-27 ENCOUNTER — Encounter: Payer: Self-pay | Admitting: Physician Assistant

## 2021-06-27 ENCOUNTER — Ambulatory Visit: Payer: Medicare HMO | Admitting: Physician Assistant

## 2021-06-27 VITALS — BP 120/70 | HR 81 | Ht 63.0 in | Wt 177.8 lb

## 2021-06-27 DIAGNOSIS — I472 Ventricular tachycardia, unspecified: Secondary | ICD-10-CM

## 2021-06-27 DIAGNOSIS — Z95810 Presence of automatic (implantable) cardiac defibrillator: Secondary | ICD-10-CM

## 2021-06-27 DIAGNOSIS — Z79899 Other long term (current) drug therapy: Secondary | ICD-10-CM

## 2021-06-27 DIAGNOSIS — I255 Ischemic cardiomyopathy: Secondary | ICD-10-CM

## 2021-06-27 DIAGNOSIS — I2510 Atherosclerotic heart disease of native coronary artery without angina pectoris: Secondary | ICD-10-CM

## 2021-06-27 DIAGNOSIS — I480 Paroxysmal atrial fibrillation: Secondary | ICD-10-CM

## 2021-06-27 LAB — CUP PACEART INCLINIC DEVICE CHECK
Battery Remaining Longevity: 80 mo
Brady Statistic RA Percent Paced: 81 %
Brady Statistic RV Percent Paced: 7.8 %
Date Time Interrogation Session: 20230407130737
HighPow Impedance: 47.5313
Implantable Lead Implant Date: 20070117
Implantable Lead Implant Date: 20070117
Implantable Lead Location: 753859
Implantable Lead Location: 753860
Implantable Lead Model: 7040
Implantable Pulse Generator Implant Date: 20220120
Lead Channel Impedance Value: 525 Ohm
Lead Channel Impedance Value: 587.5 Ohm
Lead Channel Pacing Threshold Amplitude: 0.75 V
Lead Channel Pacing Threshold Amplitude: 0.75 V
Lead Channel Pacing Threshold Amplitude: 0.75 V
Lead Channel Pacing Threshold Amplitude: 0.75 V
Lead Channel Pacing Threshold Pulse Width: 0.5 ms
Lead Channel Pacing Threshold Pulse Width: 0.5 ms
Lead Channel Pacing Threshold Pulse Width: 0.5 ms
Lead Channel Pacing Threshold Pulse Width: 0.5 ms
Lead Channel Sensing Intrinsic Amplitude: 11.8 mV
Lead Channel Sensing Intrinsic Amplitude: 3.9 mV
Lead Channel Setting Pacing Amplitude: 2.5 V
Lead Channel Setting Pacing Amplitude: 2.5 V
Lead Channel Setting Pacing Pulse Width: 0.5 ms
Lead Channel Setting Sensing Sensitivity: 0.5 mV
Pulse Gen Serial Number: 9976817

## 2021-06-27 LAB — COMPREHENSIVE METABOLIC PANEL
ALT: 11 IU/L (ref 0–32)
AST: 20 IU/L (ref 0–40)
Albumin/Globulin Ratio: 1.4 (ref 1.2–2.2)
Albumin: 4 g/dL (ref 3.7–4.7)
Alkaline Phosphatase: 78 IU/L (ref 44–121)
BUN/Creatinine Ratio: 18 (ref 12–28)
BUN: 28 mg/dL — ABNORMAL HIGH (ref 8–27)
Bilirubin Total: 0.7 mg/dL (ref 0.0–1.2)
CO2: 26 mmol/L (ref 20–29)
Calcium: 9.1 mg/dL (ref 8.7–10.3)
Chloride: 105 mmol/L (ref 96–106)
Creatinine, Ser: 1.56 mg/dL — ABNORMAL HIGH (ref 0.57–1.00)
Globulin, Total: 2.8 g/dL (ref 1.5–4.5)
Glucose: 105 mg/dL — ABNORMAL HIGH (ref 70–99)
Potassium: 4.4 mmol/L (ref 3.5–5.2)
Sodium: 141 mmol/L (ref 134–144)
Total Protein: 6.8 g/dL (ref 6.0–8.5)
eGFR: 35 mL/min/{1.73_m2} — ABNORMAL LOW (ref >59)

## 2021-06-27 LAB — HEPATIC FUNCTION PANEL: Bilirubin, Direct: <0.1 mg/dL (ref 0.00–0.40)

## 2021-06-27 NOTE — Patient Instructions (Addendum)
Medication Instructions:  ? ?Your physician recommends that you continue on your current medications as directed. Please refer to the Current Medication list given to you today. ? ? ?*If you need a refill on your cardiac medications before your next appointment, please call your pharmacy* ? ? ?Lab Work:  CMET AND LFT TODAY  ? ?If you have labs (blood work) drawn today and your tests are completely normal, you will receive your results only by: ?MyChart Message (if you have MyChart) OR ?A paper copy in the mail ?If you have any lab test that is abnormal or we need to change your treatment, we will call you to review the results. ? ? ?Testing/Procedures: NONE ORDERED  TODAY ? ? ?  ?Follow-Up: ?At [facility], you and your health needs are our priority.  As part of our continuing mission to provide you with exceptional heart care, we have created designated Provider Care Teams.  These Care Teams include your primary Cardiologist (physician) and Advanced Practice Providers (APPs -  Physician Assistants and Nurse Practitioners) who all work together to provide you with the care you need, when you need it. ? ?We recommend signing up for the patient portal called "MyChart".  Sign up information is provided on this After Visit Summary.  MyChart is used to connect with patients for Virtual Visits (Telemedicine).  Patients are able to view lab/test results, encounter notes, upcoming appointments, etc.  Non-urgent messages can be sent to your provider as well.   ?To learn more about what you can do with MyChart, go to [email or web address].   ? ?Your next appointment:   ?4 month(s) NEW PATIENT ( CONTACT [facility] FOR EP SCHEDULING ISSUES ) ? ? ?The format for your next appointment:   ?In Person ? ?Provider:   ?Lars Mage, MD{ ? ?  ?Other Instructions ? ?

## 2021-07-03 ENCOUNTER — Other Ambulatory Visit (HOSPITAL_COMMUNITY): Payer: Self-pay | Admitting: Cardiology

## 2021-07-08 ENCOUNTER — Ambulatory Visit: Payer: Medicare HMO | Admitting: Internal Medicine

## 2021-07-08 ENCOUNTER — Encounter: Payer: Self-pay | Admitting: Internal Medicine

## 2021-07-08 VITALS — BP 124/80 | HR 76 | Ht 63.0 in | Wt 178.4 lb

## 2021-07-08 DIAGNOSIS — E1159 Type 2 diabetes mellitus with other circulatory complications: Secondary | ICD-10-CM

## 2021-07-08 DIAGNOSIS — E039 Hypothyroidism, unspecified: Secondary | ICD-10-CM | POA: Diagnosis not present

## 2021-07-08 DIAGNOSIS — E785 Hyperlipidemia, unspecified: Secondary | ICD-10-CM | POA: Diagnosis not present

## 2021-07-08 DIAGNOSIS — E1165 Type 2 diabetes mellitus with hyperglycemia: Secondary | ICD-10-CM

## 2021-07-08 LAB — POCT GLYCOSYLATED HEMOGLOBIN (HGB A1C): Hemoglobin A1C: 6.3 % — AB (ref 4.0–5.6)

## 2021-07-08 NOTE — Progress Notes (Signed)
Patient ID: [name], female   DOB: 1945/12/19, 76 y.o.   MRN: [id number] ? ?This visit occurred during the SARS-CoV-2 public health emergency.  Safety protocols were in place, including screening questions prior to the visit, additional usage of staff PPE, and extensive cleaning of exam room while observing appropriate contact time as indicated for disinfecting solutions.  ? ?HPI: ?[name] is a 76 y.o.-year-old female, presenting for f/u for DM2, insulin-dependent, uncontrolled, with complications (CAD, ICM - had ICD, peripheral neuropathy, gastroparesis). Last visit 3 months ago. Before last OV, she moved away, then moved to [place]. ? ?Interim history: ?We stopped metformin at her nephrologist recommendation last year. ?She was previously on steroid injections with very high blood sugars, but not since last visit. ?No increased urination, blurry vision, nausea, chest pain. She has knee pain. ?She cannot afford Trulicity and Jardiance as she is in the donut hole. ? ?Reviewed HbA1c levels: ?Lab Results  ?Component Value Date  ? HGBA1C 6.7 (A) 04/02/2021  ? HGBA1C 7.7 (A) 11/23/2018  ? HGBA1C 7.6 (H) 08/16/2018  ?11/08/2020: HbA1c 8.5% ?09/25/2020: HbA1c 10.3% ?05/13/2020: HbA1c 9.6% ?01/22/2020: HbA1c 8% ?03/13/2019: HbA1c 7.1% ?09/26/2020: Glucose 197, C-peptide 7.76 (1.1-4.4) ?At last visit she was on: ?- Metformin XR 1000 mg with dinner ?- Levemir 35 units at bedtime ?- Novolog 10-16 units before meals (use the higher dose after a steroid inj) ?She was previously on Farxiga but stopped due to side effects (somnolence?) ? ?Now on: ?- Jardiance 10 mg before breakfast - tolerated well >> 500$ for 1 mo in doughnut hole ?- Levemir 25 >> 20 units at bedtime ?- Novolog 7 >> 5 units before meals ?- Trulicity 0.53 >> 1.5 mg weekly in a.m. - no nausea >> 800$ for 1.5 mo in doughnut hole ? ?Pt checks her sugars 3-4 times a day: ?- am: 75, 110-182, but 210-385 (steroids) >> 84-140, 156, 187 >> 96-114, 124 ?-  after b'fast: 175 >> n/c ?- before lunch: 126-310 >> 81, 116-432, 515 >> 85-138, 142, 191 >> 102-125 ?- after lunch:  45, 47, 140-150 >> n/c ?- before dinner: 120-180, 352 >> ? >> 71, 130-410 >> 88-135 >> 63, 96-120 ?- bedtime:  115-210 >> 140-202 >> 110-360 >> 91-185, 216, 221 >> 108-123 ?Lowest sugar was 86 >> 58 >> 50s at night >> 63;  she has hypoglycemia awareness in the 90s. ?Highest sugar was 310 >> 515 (steroid) >> 221 >> 125. ? ?-+ CKD, last BUN/creatinine:   ?Lab Results  ?Component Value Date  ? BUN 28 (H) 06/27/2021  ? CREATININE 1.56 (H) 06/27/2021  ? ?GFR low -she sees nephrology: ?Lab Results  ?Component Value Date  ? GFRNONAA 24 (L) 06/10/2021  ? GFRNONAA 27 (L) 05/20/2021  ? GFRNONAA 40 (L) 01/14/2021  ? GFRNONAA 36 (L) 04/01/2020  ? GFRNONAA 29 (L) 09/29/2019  ? ?ACR was high at last check: ?09/26/2020: ACR 55.3 ?Lab Results  ?Component Value Date  ? MICRALBCREAT 5.3 08/16/2018  ? MICRALBCREAT 9.4 04/08/2017  ? MICRALBCREAT 3.2 06/25/2016  ? MICRALBCREAT 1.6 12/21/2014  ? MICRALBCREAT 1.0 12/15/2012  ?On Entresto. ? ?-+ HL; last set of lipids: ?08/28/2020: 145/117/63/72 ?Lab Results  ?Component Value Date  ? CHOL 135 09/29/2019  ? HDL 56 09/29/2019  ? [place] 56 09/29/2019  ? TRIG 116 09/29/2019  ? CHOLHDL 2.4 09/29/2019  ?On Crestor 20 >> 40. ? ?- last eye exam was in 2018: No DR ? ?- she has numbness and tingling in her feet-on Neurontin.  ? ?  She is on Eliquis. On amiodarone. ? ?She has mild hypothyroidism; latest TSH was slightly low: ?Lab Results  ?Component Value Date  ? TSH 2.13 04/02/2021  ? ?She was on levothyroxine 25 mcg daily -stopped by PCP when the above results returned. ? ?She has no family history of medullary thyroid cancer or personal history of pancreatitis. ?Her 56 y/o son passed away in 06/28/2018 - had cirrhosis >> developed cough, fever, generalized pain+ bilateral PNA (Covid 19 negative).   ?Her nephew died in 07/29/18 and her brother died Nov 28, 2018.   ? ?She saw endocrinology at [facility], with the last office visit in 09/2020.  Her diabetic regimen was changed at that time (Trulicity and Jardiance added).  Sugars improved. ? ?ROS: ?+ see HPI ?Neurological: no tremors/+ numbness/+ tingling/no dizziness ? ?I reviewed pt's medications, allergies, PMH, social hx, family hx, and changes were documented in the history of present illness. Otherwise, unchanged from my initial visit note. ? ?Past Medical History:  ?Diagnosis Date  ? Allergy   ? Arthritis   ? Atrial fibrillation ([place])   ? controlled with amiodarone, on coumadin  ? Chronic renal insufficiency   ? Chronic systolic heart failure ([place])   ? Congestive heart failure ([place])   ? Coronary artery disease 01/31/2011  ? Diabetes mellitus   ? type 1  ? 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   ? History of chicken pox   ? Hyperlipidemia   ? Hypertension   ? Ischemic cardiomyopathy   ? Lumbar spondylosis 01/11/2012  ? Sleep apnea   ? Stroke [facility]) 2008/06/27  ? eye doctor said she had TIA  ? Ventricular tachycardia ([place])   ? Polymorphic  ? ?Past Surgical History:  ?Procedure Laterality Date  ? ABDOMINAL HYSTERECTOMY  1995  ? CARDIAC DEFIBRILLATOR PLACEMENT    ? CHOLECYSTECTOMY  1985  ? COLONOSCOPY WITH PROPOFOL N/A 02/24/2018  ? Procedure: COLONOSCOPY WITH PROPOFOL;  Surgeon: Milus Banister, MD;  Location: [facility] ENDOSCOPY;  Service: Endoscopy;  Laterality: N/A;  ? ICD  202007/04/07  ? implanted by Dr Rollene Fare, most recent generator change 2/13 by Dr Rayann Heman, Analyze ST study patient  ? ICD GENERATOR CHANGEOUT N/A 04/11/2020  ? Procedure: ICD GENERATOR CHANGEOUT;  Surgeon: Thompson Grayer, MD;  Location: [place] CV LAB;  Service: Cardiovascular;  Laterality: N/A;  ? IMPLANTABLE CARDIOVERTER DEFIBRILLATOR (ICD) GENERATOR CHANGE N/A 05/05/2011  ? Procedure: ICD GENERATOR CHANGE;  Surgeon: Thompson Grayer, MD;  Location: [facility] CATH LAB;  Service: Cardiovascular;  Laterality: N/A;  ? LEFT HEART CATHETERIZATION WITH CORONARY ANGIOGRAM N/A 01/30/2011  ?  Procedure: LEFT HEART CATHETERIZATION WITH CORONARY ANGIOGRAM;  Surgeon: Larey Dresser, MD;  Location: [facility] CATH LAB;  Service: Cardiovascular;  Laterality: N/A;  ? POLYPECTOMY  02/24/2018  ? Procedure: POLYPECTOMY;  Surgeon: Milus Banister, MD;  Location: [facility]OS[facility];  Service: Endoscopy;;  ? [place]  ? ?Social History  ? ?Socioeconomic History  ? Marital status: Divorced  ?  Spouse name: Not on file  ? Number of children: 3  ? Years of education: 40  ? Highest education level: Not on file  ?Occupational History  ? Occupation: Retried  ?Tobacco Use  ? Smoking status: Former  ?  Types: Cigarettes  ?  Quit date: 08/16/1995  ?  Years since quitting: 25.9  ? Smokeless tobacco: Never  ?Vaping Use  ? Vaping Use: Never used  ?Substance and Sexual Activity  ? Alcohol use: No  ? Drug use: No  ?  Sexual activity: Never  ?  Birth control/protection: Post-menopausal  ?Other Topics Concern  ? Not on file  ?Social History Narrative  ? Regular exercise-no  ? Caffeine Use-no  ? ?Social Determinants of Health  ? ?Financial Resource Strain: Not on file  ?Food Insecurity: Not on file  ?Transportation Needs: Not on file  ?Physical Activity: Not on file  ?Stress: Not on file  ?Social Connections: Not on file  ?Intimate Partner Violence: Not on file  ? ?Current Outpatient Medications on File Prior to Visit  ?Medication Sig Dispense Refill  ? acetaminophen (TYLENOL) 500 MG tablet Take 500 mg by mouth daily as needed for moderate pain or headache.    ? amiodarone (PACERONE) 100 MG tablet TAKE 1 TABLET BY MOUTH EVERY DAY 90 tablet 3  ? Blood Glucose Monitoring Suppl (ACCU-CHEK NANO SMARTVIEW) W/DEVICE KIT Use to test blood sugar 4 times daily as instructed. Dx code: E11.59 1 kit 0  ? busPIRone (BUSPAR) 5 MG tablet Take 1 tablet (5 mg total) by mouth 2 (two) times daily. 60 tablet 1  ? carvedilol (COREG) 12.5 MG tablet Take 1 tablet (12.5 mg total) by mouth 2 (two) times daily with a meal. 180 tablet 3  ? cetirizine (ZYRTEC) 10  MG tablet Take 10 mg by mouth daily.    ? Cholecalciferol (VITAMIN D3) 2000 UNITS TABS Take 2,000 Units by mouth daily.    ? dicyclomine (BENTYL) 10 MG capsule TAKE 1 CAPSULE (10 MG TOTAL) BY MOUTH 4 (FOUR) T

## 2021-07-08 NOTE — Patient Instructions (Addendum)
Please continue: ?- Jardiance 10 mg before breakfast ?- Trulicity 1.5 mg weekly in a.m. ?- Levemir 20 units at bedtime ? ?Try to decrease: ?- Novolog 5 units before large meals only ? ?Please return in 4 months with your sugar log.  ?

## 2021-07-16 ENCOUNTER — Ambulatory Visit (INDEPENDENT_AMBULATORY_CARE_PROVIDER_SITE_OTHER): Payer: Medicare HMO

## 2021-07-16 DIAGNOSIS — I255 Ischemic cardiomyopathy: Secondary | ICD-10-CM | POA: Diagnosis not present

## 2021-07-17 LAB — CUP PACEART REMOTE DEVICE CHECK
Battery Remaining Longevity: 79 mo
Battery Remaining Percentage: 86 %
Battery Voltage: 3.05 V
Brady Statistic AP VP Percent: 8.5 %
Brady Statistic AP VS Percent: 64 %
Brady Statistic AS VP Percent: 3 %
Brady Statistic AS VS Percent: 19 %
Brady Statistic RA Percent Paced: 65 %
Brady Statistic RV Percent Paced: 12 %
Date Time Interrogation Session: 20230426020027
HighPow Impedance: 47 Ohm
HighPow Impedance: 47 Ohm
Implantable Lead Implant Date: 20070117
Implantable Lead Implant Date: 20070117
Implantable Lead Location: 753859
Implantable Lead Location: 753860
Implantable Lead Model: 7040
Implantable Pulse Generator Implant Date: 20220120
Lead Channel Impedance Value: 440 Ohm
Lead Channel Impedance Value: 640 Ohm
Lead Channel Pacing Threshold Amplitude: 0.75 V
Lead Channel Pacing Threshold Amplitude: 0.75 V
Lead Channel Pacing Threshold Pulse Width: 0.5 ms
Lead Channel Pacing Threshold Pulse Width: 0.5 ms
Lead Channel Sensing Intrinsic Amplitude: 11.8 mV
Lead Channel Sensing Intrinsic Amplitude: 5 mV
Lead Channel Setting Pacing Amplitude: 2.5 V
Lead Channel Setting Pacing Amplitude: 2.5 V
Lead Channel Setting Pacing Pulse Width: 0.5 ms
Lead Channel Setting Sensing Sensitivity: 0.5 mV
Pulse Gen Serial Number: 9976817

## 2021-07-31 NOTE — Progress Notes (Signed)
Remote ICD transmission.   

## 2021-08-05 ENCOUNTER — Other Ambulatory Visit (HOSPITAL_COMMUNITY): Payer: Self-pay | Admitting: *Deleted

## 2021-08-07 ENCOUNTER — Other Ambulatory Visit (HOSPITAL_COMMUNITY): Payer: Self-pay

## 2021-08-19 ENCOUNTER — Telehealth: Payer: Self-pay

## 2021-08-19 ENCOUNTER — Other Ambulatory Visit (HOSPITAL_COMMUNITY): Payer: Self-pay | Admitting: *Deleted

## 2021-08-19 DIAGNOSIS — E1142 Type 2 diabetes mellitus with diabetic polyneuropathy: Secondary | ICD-10-CM

## 2021-08-19 MED ORDER — AMIODARONE HCL 100 MG PO TABS: 100.0000 mg | ORAL_TABLET | Freq: Every day | ORAL | 3 refills | Status: DC

## 2021-08-19 NOTE — Telephone Encounter (Signed)
Patient [name] wanting to inquire about a refill of Gabapentin. Previous prescriber was Biagio Borg, MD but she has since then moved and no longer under that prescribers' care and would like to request a refill of Gabapentin since she will be out soon. Please advise

## 2021-08-19 NOTE — Telephone Encounter (Signed)
She is on fairly high-dose of Neurontin for her kidney function.  I can send a prescription for 200 mg twice a day.  Would she agree with this?

## 2021-08-21 ENCOUNTER — Telehealth (HOSPITAL_COMMUNITY): Payer: Self-pay | Admitting: *Deleted

## 2021-08-21 NOTE — Telephone Encounter (Signed)
Pt left vm requesting refills. Refills were sent yesterday. I called pt back no answer/no vm.

## 2021-08-22 ENCOUNTER — Other Ambulatory Visit: Payer: Self-pay | Admitting: Internal Medicine

## 2021-08-22 DIAGNOSIS — E1142 Type 2 diabetes mellitus with diabetic polyneuropathy: Secondary | ICD-10-CM

## 2021-08-22 MED ORDER — GABAPENTIN 100 MG PO CAPS
200.0000 mg | ORAL_CAPSULE | Freq: Two times a day (BID) | ORAL | 0 refills | Status: DC
Start: 2021-08-22 — End: 2021-09-24

## 2021-08-22 NOTE — Telephone Encounter (Signed)
Thank you! C

## 2021-08-22 NOTE — Telephone Encounter (Signed)
Pt contacted and advised of alternative dose to be sent to preferred pharmacy. Pt agreed to dose and Rx sent to preferred pharmacy.

## 2021-08-22 NOTE — Addendum Note (Signed)
Addended by: Lauralyn Primes on: 08/22/2021 11:33 AM   Modules accepted: Orders

## 2021-08-30 ENCOUNTER — Emergency Department (HOSPITAL_COMMUNITY): Payer: Medicare HMO

## 2021-08-30 ENCOUNTER — Inpatient Hospital Stay (HOSPITAL_COMMUNITY)
Admission: EM | Admit: 2021-08-30 | Discharge: 2021-09-03 | DRG: 057 | Disposition: A | Discharge status: Home Health | Payer: Medicare HMO | Attending: Internal Medicine | Admitting: Internal Medicine

## 2021-08-30 DIAGNOSIS — Z83438 Family history of other disorder of lipoprotein metabolism and other lipidemia: Secondary | ICD-10-CM

## 2021-08-30 DIAGNOSIS — E1129 Type 2 diabetes mellitus with other diabetic kidney complication: Secondary | ICD-10-CM | POA: Diagnosis present

## 2021-08-30 DIAGNOSIS — R569 Unspecified convulsions: Secondary | ICD-10-CM | POA: Diagnosis not present

## 2021-08-30 DIAGNOSIS — E669 Obesity, unspecified: Secondary | ICD-10-CM | POA: Diagnosis present

## 2021-08-30 DIAGNOSIS — G4730 Sleep apnea, unspecified: Secondary | ICD-10-CM | POA: Diagnosis present

## 2021-08-30 DIAGNOSIS — I5022 Chronic systolic (congestive) heart failure: Secondary | ICD-10-CM | POA: Diagnosis present

## 2021-08-30 DIAGNOSIS — I480 Paroxysmal atrial fibrillation: Secondary | ICD-10-CM | POA: Diagnosis present

## 2021-08-30 DIAGNOSIS — M25561 Pain in right knee: Secondary | ICD-10-CM | POA: Diagnosis not present

## 2021-08-30 DIAGNOSIS — I69398 Other sequelae of cerebral infarction: Secondary | ICD-10-CM | POA: Diagnosis not present

## 2021-08-30 DIAGNOSIS — R410 Disorientation, unspecified: Secondary | ICD-10-CM | POA: Diagnosis not present

## 2021-08-30 DIAGNOSIS — I255 Ischemic cardiomyopathy: Secondary | ICD-10-CM | POA: Diagnosis present

## 2021-08-30 DIAGNOSIS — G9341 Metabolic encephalopathy: Secondary | ICD-10-CM | POA: Diagnosis present

## 2021-08-30 DIAGNOSIS — Z8249 Family history of ischemic heart disease and other diseases of the circulatory system: Secondary | ICD-10-CM

## 2021-08-30 DIAGNOSIS — M1711 Unilateral primary osteoarthritis, right knee: Secondary | ICD-10-CM | POA: Diagnosis present

## 2021-08-30 DIAGNOSIS — I2510 Atherosclerotic heart disease of native coronary artery without angina pectoris: Secondary | ICD-10-CM | POA: Diagnosis present

## 2021-08-30 DIAGNOSIS — Z7901 Long term (current) use of anticoagulants: Secondary | ICD-10-CM

## 2021-08-30 DIAGNOSIS — R531 Weakness: Principal | ICD-10-CM

## 2021-08-30 DIAGNOSIS — Z95810 Presence of automatic (implantable) cardiac defibrillator: Secondary | ICD-10-CM

## 2021-08-30 DIAGNOSIS — E039 Hypothyroidism, unspecified: Secondary | ICD-10-CM | POA: Diagnosis present

## 2021-08-30 DIAGNOSIS — I130 Hypertensive heart and chronic kidney disease with heart failure and stage 1 through stage 4 chronic kidney disease, or unspecified chronic kidney disease: Secondary | ICD-10-CM | POA: Diagnosis present

## 2021-08-30 DIAGNOSIS — E1122 Type 2 diabetes mellitus with diabetic chronic kidney disease: Secondary | ICD-10-CM | POA: Diagnosis present

## 2021-08-30 DIAGNOSIS — Z87891 Personal history of nicotine dependence: Secondary | ICD-10-CM

## 2021-08-30 DIAGNOSIS — M1990 Unspecified osteoarthritis, unspecified site: Secondary | ICD-10-CM

## 2021-08-30 DIAGNOSIS — E785 Hyperlipidemia, unspecified: Secondary | ICD-10-CM | POA: Diagnosis present

## 2021-08-30 DIAGNOSIS — N1832 Chronic kidney disease, stage 3b: Secondary | ICD-10-CM | POA: Diagnosis present

## 2021-08-30 DIAGNOSIS — Z888 Allergy status to other drugs, medicaments and biological substances status: Secondary | ICD-10-CM

## 2021-08-30 DIAGNOSIS — Z794 Long term (current) use of insulin: Secondary | ICD-10-CM

## 2021-08-30 DIAGNOSIS — Z7985 Long-term (current) use of injectable non-insulin antidiabetic drugs: Secondary | ICD-10-CM

## 2021-08-30 DIAGNOSIS — Z955 Presence of coronary angioplasty implant and graft: Secondary | ICD-10-CM

## 2021-08-30 DIAGNOSIS — I10 Essential (primary) hypertension: Secondary | ICD-10-CM | POA: Diagnosis present

## 2021-08-30 DIAGNOSIS — R4701 Aphasia: Secondary | ICD-10-CM | POA: Diagnosis present

## 2021-08-30 DIAGNOSIS — Z833 Family history of diabetes mellitus: Secondary | ICD-10-CM

## 2021-08-30 DIAGNOSIS — Z6831 Body mass index (BMI) 31.0-31.9, adult: Secondary | ICD-10-CM

## 2021-08-30 DIAGNOSIS — Z7984 Long term (current) use of oral hypoglycemic drugs: Secondary | ICD-10-CM

## 2021-08-30 DIAGNOSIS — Z79899 Other long term (current) drug therapy: Secondary | ICD-10-CM

## 2021-08-30 LAB — CBC WITH DIFFERENTIAL/PLATELET
Abs Immature Granulocytes: 0.03 10*3/uL (ref 0.00–0.07)
Basophils Absolute: 0 10*3/uL (ref 0.0–0.1)
Basophils Relative: 0 %
Eosinophils Absolute: 0.1 10*3/uL (ref 0.0–0.5)
Eosinophils Relative: 1 %
HCT: 44.2 % (ref 36.0–46.0)
Hemoglobin: 13.6 g/dL (ref 12.0–15.0)
Immature Granulocytes: 0 %
Lymphocytes Relative: 18 %
Lymphs Abs: 1.6 10*3/uL (ref 0.7–4.0)
MCH: 29.1 pg (ref 26.0–34.0)
MCHC: 30.8 g/dL (ref 30.0–36.0)
MCV: 94.6 fL (ref 80.0–100.0)
Monocytes Absolute: 0.8 10*3/uL (ref 0.1–1.0)
Monocytes Relative: 9 %
Neutro Abs: 6.7 10*3/uL (ref 1.7–7.7)
Neutrophils Relative %: 72 %
Platelets: 213 10*3/uL (ref 150–400)
RBC: 4.67 MIL/uL (ref 3.87–5.11)
RDW: 15.2 % (ref 11.5–15.5)
WBC: 9.2 10*3/uL (ref 4.0–10.5)
nRBC: 0 % (ref 0.0–0.2)

## 2021-08-30 LAB — BASIC METABOLIC PANEL
Anion gap: 13 (ref 5–15)
BUN: 33 mg/dL — ABNORMAL HIGH (ref 8–23)
CO2: 21 mmol/L — ABNORMAL LOW (ref 22–32)
Calcium: 9 mg/dL (ref 8.9–10.3)
Chloride: 108 mmol/L (ref 98–111)
Creatinine, Ser: 1.78 mg/dL — ABNORMAL HIGH (ref 0.44–1.00)
GFR, Estimated: 30 mL/min — ABNORMAL LOW (ref >60)
Glucose, Bld: 152 mg/dL — ABNORMAL HIGH (ref 70–99)
Potassium: 4.3 mmol/L (ref 3.5–5.1)
Sodium: 142 mmol/L (ref 135–145)

## 2021-08-30 LAB — CBG MONITORING, ED: Glucose-Capillary: 145 mg/dL — ABNORMAL HIGH (ref 70–99)

## 2021-08-30 MED ORDER — ASPIRIN 300 MG RE SUPP
300.0000 mg | Freq: Once | RECTAL | Status: AC
  Administered 2021-08-31: 300 mg via RECTAL
  Filled 2021-08-30: qty 1

## 2021-08-30 MED ORDER — IOHEXOL 350 MG/ML SOLN
60.0000 mL | Freq: Once | INTRAVENOUS | Status: AC | PRN
  Administered 2021-08-30: 60 mL via INTRAVENOUS

## 2021-08-30 MED ORDER — LORAZEPAM 2 MG/ML IJ SOLN
2.0000 mg | Freq: Once | INTRAMUSCULAR | Status: AC
Start: 2021-08-30 — End: 2021-08-30
  Administered 2021-08-30: 2 mg via INTRAVENOUS
  Filled 2021-08-30: qty 1

## 2021-08-30 MED ORDER — SODIUM CHLORIDE 0.9 % IV SOLN: INTRAVENOUS | Status: DC

## 2021-08-30 NOTE — ED Provider Notes (Signed)
[facility] Provider Note   CSN: [id number] Arrival date & time: 08/30/21  2012     History  Chief Complaint  Patient presents with   Stroke Symptoms    [name] is a 76 y.o. female.  Patient presents with chief complaint of weakness slurred speech leaning gait.  Reportedly last seen normal at 10 AM today.  Patient unable to give additional history.  No family available.  Phone numbers listed were called several times and they appear to have no voicemail set up.       Home Medications Prior to Admission medications   Medication Sig Start Date End Date Taking? Authorizing Provider  acetaminophen (TYLENOL) 500 MG tablet Take 500 mg by mouth daily as needed for moderate pain or headache.    [provider]  amiodarone (PACERONE) 100 MG tablet Take 1 tablet (100 mg total) by mouth daily. 08/19/21   Larey Dresser, MD  Blood Glucose Monitoring Suppl (ACCU-CHEK NANO SMARTVIEW) W/DEVICE KIT Use to test blood sugar 4 times daily as instructed. Dx code: E11.59 03/01/14   Philemon Kingdom, MD  busPIRone (BUSPAR) 5 MG tablet Take 1 tablet (5 mg total) by mouth 2 (two) times daily. 05/20/21   Rafael Bihari, FNP  carvedilol (COREG) 12.5 MG tablet Take 1 tablet (12.5 mg total) by mouth 2 (two) times daily with a meal. 10/09/20   Larey Dresser, MD  cetirizine (ZYRTEC) 10 MG tablet Take 10 mg by mouth daily.    [provider]  Cholecalciferol (VITAMIN D3) 2000 UNITS TABS Take 2,000 Units by mouth daily.    [provider]  dicyclomine (BENTYL) 10 MG capsule TAKE 1 CAPSULE (10 MG TOTAL) BY MOUTH 4 (FOUR) TIMES DAILY BEFORE MEALS AND AT BEDTIME. 02/09/20   Biagio Borg, MD  Dulaglutide (TRULICITY) 1.5 NI/6.2VO SOPN Inject 1.5 mg into the skin once a week. 04/02/21   Philemon Kingdom, MD  ELIQUIS 5 MG TABS tablet TAKE 1 TABLET BY MOUTH TWICE A DAY 11/26/20   Larey Dresser, MD  empagliflozin (JARDIANCE) 10 MG TABS  tablet Take 10 mg by mouth daily.    [provider]  ENTRESTO 49-51 MG TAKE 1 TABLET BY MOUTH TWICE A DAY 07/03/21   Larey Dresser, MD  fluticasone [facility]) 50 MCG/ACT nasal spray Place into the nose. 03/13/19 10/09/21  [provider]  furosemide (LASIX) 20 MG tablet Take 1 tablet (20 mg total) by mouth daily. 05/20/21   [place], Maricela Bo, FNP  gabapentin (NEURONTIN) 100 MG capsule Take 2 capsules (200 mg total) by mouth 2 (two) times daily. 08/22/21   Philemon Kingdom, MD  glucose blood (ACCU-CHEK SMARTVIEW) test strip Use to test blood sugar 4 times daily as instructed. Dx code E11.59 07/19/14   Philemon Kingdom, MD  insulin aspart (NOVOLOG FLEXPEN) 100 UNIT/ML FlexPen Inject up to 15 units daily under skin as advised 04/02/21   Philemon Kingdom, MD  insulin detemir (LEVEMIR FLEXTOUCH) 100 UNIT/ML FlexPen Inject 20 Units into the skin daily. 04/02/21   Philemon Kingdom, MD  magnesium oxide (MAG-OX) 400 (240 Mg) MG tablet TAKE 1 TABLET BY MOUTH TWICE A DAY 12/18/20   Biagio Borg, MD  Multiple Vitamins-Minerals (EYE VITAMINS PO) Take 1 tablet by mouth 2 (two) times daily.     [provider]  nitroGLYCERIN (NITROSTAT) 0.4 MG SL tablet Place 1 tablet (0.4 mg total) under the tongue every 5 (five) minutes as needed for chest pain.  05/20/21   Rafael Bihari, FNP  omeprazole (PRILOSEC) 20 MG capsule TAKE 1 CAPSULE BY MOUTH EVERY DAY 02/09/20   Biagio Borg, MD  rosuvastatin (CRESTOR) 40 MG tablet Take 1 tablet (40 mg total) by mouth daily. 09/26/20   Larey Dresser, MD  sacubitril-valsartan (ENTRESTO) 97-103 MG Take 1 tablet by mouth 2 (two) times daily. 01/14/21   Larey Dresser, MD  traMADol Veatrice Bourbon) 50 MG tablet TAKE 1 TABLET BY MOUTH EVERY 6 HOURS AS NEEDED FOR PAIN 03/03/19   Biagio Borg, MD      Allergies    Veltassa [patiromer], Darvon, Promethazine hcl, Spironolactone, and Plavix [clopidogrel bisulfate]    Review of Systems   Review of Systems  Unable  to perform ROS: Patient nonverbal    Physical Exam Updated Vital Signs BP (!) 143/108   Pulse 65   Temp 98.2 F (36.8 C)   Resp 18   SpO2 98%  Physical Exam Constitutional:      General: She is not in acute distress.    Appearance: Normal appearance.  HENT:     Head: Normocephalic.     Nose: Nose normal.  Eyes:     Extraocular Movements: Extraocular movements intact.  Cardiovascular:     Rate and Rhythm: Normal rate.  Pulmonary:     Effort: Pulmonary effort is normal.  Musculoskeletal:        General: Normal range of motion.     Cervical back: Normal range of motion.  Neurological:     Mental Status: She is alert.     Comments: Appears to be moving all extremities, questionable generalized weakness bilateral upper and lower extremities.  No obvious facial droop noted.  Speech appears aphasic and unable to be understood.     ED Results / Procedures / Treatments   Labs (all labs ordered are listed, but only abnormal results are displayed) Labs Reviewed  BASIC METABOLIC PANEL - Abnormal; Notable for the following components:      Result Value   CO2 21 (*)    Glucose, Bld 152 (*)    BUN 33 (*)    Creatinine, Ser 1.78 (*)    GFR, Estimated 30 (*)    All other components within normal limits  CBG MONITORING, ED - Abnormal; Notable for the following components:   Glucose-Capillary 145 (*)    All other components within normal limits  CBC WITH DIFFERENTIAL/PLATELET  APTT  PROTIME-INR    EKG EKG Interpretation  Date/Time:  Saturday August 30 2021 20:24:29 EDT Ventricular Rate:  64 PR Interval:  191 QRS Duration: 100 QT Interval:  423 QTC Calculation: 437 R Axis:   -43 Text Interpretation: Sinus rhythm Ventricular premature complex Left ventricular hypertrophy Inferior infarct, age indeterminate Probable anterior infarct, age indeterminate Confirmed by [place] ([id number]) on 08/30/2021 8:39:59 PM  Radiology CT ANGIO HEAD NECK W WO CM  Result Date:  08/30/2021 CLINICAL DATA:  Acute neurologic deficit EXAM: CT ANGIOGRAPHY HEAD AND NECK TECHNIQUE: Multidetector CT imaging of the head and neck was performed using the standard protocol during bolus administration of intravenous contrast. Multiplanar CT image reconstructions and MIPs were obtained to evaluate the vascular anatomy. Carotid stenosis measurements (when applicable) are obtained utilizing NASCET criteria, using the distal internal carotid diameter as the denominator. RADIATION DOSE REDUCTION: This exam was performed according to the departmental dose-optimization program which includes automated exposure control, adjustment of the mA and/or kV according to patient size and/or use of iterative reconstruction technique. CONTRAST:  11mL OMNIPAQUE IOHEXOL 350 MG/ML SOLN COMPARISON:  None Available. FINDINGS: CTA NECK FINDINGS SKELETON: There is no bony spinal canal stenosis. No lytic or blastic lesion. OTHER NECK: Normal pharynx, larynx and major salivary glands. No cervical lymphadenopathy. Unremarkable thyroid gland. UPPER CHEST: No pneumothorax or pleural effusion. No nodules or masses. AORTIC ARCH: There is calcific atherosclerosis of the aortic arch. There is no aneurysm, dissection or hemodynamically significant stenosis of the visualized portion of the aorta. Conventional 3 vessel aortic branching pattern. The visualized proximal subclavian arteries are widely patent. RIGHT CAROTID SYSTEM: Normal without aneurysm, dissection or stenosis. LEFT CAROTID SYSTEM: Normal without aneurysm, dissection or stenosis. VERTEBRAL ARTERIES: Left dominant configuration. Both origins are clearly patent. There is no dissection, occlusion or flow-limiting stenosis to the skull base (V1-V3 segments). CTA HEAD FINDINGS POSTERIOR CIRCULATION: --Vertebral arteries: Normal V4 segments. --Inferior cerebellar arteries: Normal. --Basilar artery: Normal. --Superior cerebellar arteries: Normal. --Posterior cerebral arteries  (PCA): Normal. ANTERIOR CIRCULATION: --Intracranial internal carotid arteries: Atherosclerotic calcification of the internal carotid arteries at the skull base without hemodynamically significant stenosis. --Anterior cerebral arteries (ACA): Normal. Both A1 segments are present. Patent anterior communicating artery (a-comm). --Middle cerebral arteries (MCA): Normal. VENOUS SINUSES: As permitted by contrast timing, patent. ANATOMIC VARIANTS: None Review of the MIP images confirms the above findings. IMPRESSION: 1. No emergent large vessel occlusion or high-grade stenosis of the intracranial or cervical arteries. 2. Aortic Atherosclerosis (ICD10-I70.0). Electronically Signed   By: Ulyses Jarred M.D.   On: 08/30/2021 22:38   CT Head Wo Contrast  Result Date: 08/30/2021 CLINICAL DATA:  Neuro deficit, acute, stroke suspected EXAM: CT HEAD WITHOUT CONTRAST TECHNIQUE: Contiguous axial images were obtained from the base of the skull through the vertex without intravenous contrast. RADIATION DOSE REDUCTION: This exam was performed according to the departmental dose-optimization program which includes automated exposure control, adjustment of the mA and/or kV according to patient size and/or use of iterative reconstruction technique. COMPARISON:  07/27/2012 FINDINGS: Brain: Old right frontal infarct, stable. No acute intracranial abnormality. Specifically, no hemorrhage, hydrocephalus, mass lesion, acute infarction, or significant intracranial injury. Vascular: No hyperdense vessel or unexpected calcification. Skull: No acute calvarial abnormality. Sinuses/Orbits: No acute findings Other: None IMPRESSION: Old right frontal infarct, stable. No acute intracranial abnormality. Electronically Signed   By: Rolm Baptise M.D.   On: 08/30/2021 21:08    Procedures Procedures    Medications Ordered in ED Medications  LORazepam (ATIVAN) injection 2 mg (has no administration in time range)  0.9 %  sodium chloride infusion  (has no administration in time range)  aspirin suppository 300 mg (has no administration in time range)  iohexol (OMNIPAQUE) 350 MG/ML injection 60 mL (60 mLs Intravenous Contrast Given 08/30/21 2231)    ED Course/ Medical Decision Making/ A&P                           Medical Decision Making Amount and/or Complexity of Data Reviewed Labs: ordered. Radiology: ordered.  Risk Prescription drug management.   Chart review shows patient is on Eliquis, office visit July 08, 2021 for poorly controlled diabetes.  Patient outside the window for tPA.  Last known well is unclear.  Reportedly at 10 AM which would support her outside the window for tPA.  CT of the brain is unremarkable.  Stroke alert activated to expedite CT angio and further work-up.  Neurology consult requested.  Patient signed out to oncoming provider.        Final  Clinical Impression(s) / ED Diagnoses Final diagnoses:  Weakness    Rx / DC Orders ED Discharge Orders     None         Luna Fuse, MD 08/30/21 2256

## 2021-08-30 NOTE — ED Notes (Signed)
This RN attempted to call pts son Aaron Edelman and daughter Lattie Haw with no response. Also called home phone with no answer as well.

## 2021-08-30 NOTE — Code Documentation (Signed)
Stroke Response Nurse Documentation Code Documentation  [name] is a 76 y.o. female arriving to [facility]  via [facility] on 6/10 with past medical hx of CAD with PCI, CHF, VT/AICD, CVA, afib, HTN, HLD, DM. On Eliquis (apixaban) daily. Code stroke was activated by ED.   Patient from home where she was LKW at 1000 and now complaining of left sided weakness.   Stroke team at the bedside on patient arrival. Labs drawn and patient cleared for CT by Dr. Almyra Free. Patient to CT with team. NIHSS 10, see documentation for details and code stroke times. Patient with decreased LOC, disoriented, not following commands, left facial droop, right arm weakness, right leg weakness, Expressive aphasia , dysarthria , and Visual  neglect on exam. The following imaging was completed:  CT Head, CTA, and MRI. Patient is not a candidate for IV Thrombolytic due to Out of Window. Patient is not a candidate for IR due to No LVO.   Care Plan: MRI.   Bedside handoff with ED RN Aryana.    Madelynn Done  Rapid Response RN

## 2021-08-30 NOTE — Progress Notes (Signed)
EEG complete - results pending 

## 2021-08-30 NOTE — Progress Notes (Signed)
IV obtained by ED RN. 

## 2021-08-30 NOTE — ED Notes (Signed)
Stroke activated

## 2021-08-30 NOTE — ED Triage Notes (Signed)
Pt bib [facility] from home, family reports generalized weakness and lethargy since 1000 this morning. Per EMS, weakness increased on left side, pt also leaning towards left side. Axo x4, speech slowed, unable to follow commands for stroke screen. Hx afib, on eliquis. Pacemaker in place.

## 2021-08-30 NOTE — Consult Note (Addendum)
Stroke Neurology Consultation Note  Consult Requested by: Dr. Almyra Free  Reason for Consult: code stroke  Consult Date: 08/30/21   The history was obtained from the chart.  I tried to obtain more info by calling the contacts (son and daughter) in charts, but nobody picked up the phone.  History of Present Illness:  [name] is a 76 y.o. Caucasian female with PMH of CAD s/p PCI and stents, cardiomyopathy, chronic CHF, VT with ICD, AFib on eliquis, stroke, DM, HTN, HLD, CKD (III) presented to ED for lethargy and generalized weakness since 10am. Per chart, EMS reported increased weakness on the left side and pt leaning towards left, but AAOx4, slow speech but not follow commands. On my exam after code stroke activated at 9:40pm, pt only told me her name but not able to answer other questions, seem aphasic, only follows some midline commands, repeat 3-words sentences but perseverated and moving all extremities. She kept to have eyelid twitching bilaterally and difficulty open eyes. No other shaking jerking movement. CT head showed old right frontal infarct but no acute finding. CTA head and neck and perfusion is pending.  Per cardiology notes in 06/2021 pt had PMVT on amio and ICD, EF 40-45% follwed by HF team. PAF on eliquis.   LSN: 10am tPA Given: No: outside window and on eliquis IR: no, no LVO, the right mid P3 occlusion is associated with her old right frontal infarct mRS unclear at this time  Past Medical History:  Diagnosis Date   Allergy    Arthritis    Atrial fibrillation ([place])    controlled with amiodarone, on coumadin   Chronic renal insufficiency    Chronic systolic heart failure (HCC)    Congestive heart failure ([place])    Coronary artery disease 01/31/2011   Diabetes mellitus    type 1   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    History of chicken pox    Hyperlipidemia    Hypertension    Ischemic cardiomyopathy    Lumbar spondylosis 01/11/2012   Sleep apnea     Stroke [facility]) 2010   eye doctor said she had TIA   Ventricular tachycardia ([place])    Polymorphic    Past Surgical History:  Procedure Laterality Date   [place]   COLONOSCOPY WITH PROPOFOL N/A 02/24/2018   Procedure: COLONOSCOPY WITH PROPOFOL;  Surgeon: Milus Banister, MD;  Location: [facility] ENDOSCOPY;  Service: Endoscopy;  Laterality: N/A;   ICD  2003/2007   implanted by Dr Rollene Fare, most recent generator change 2/13 by Dr Rayann Heman, Analyze ST study patient   ICD GENERATOR CHANGEOUT N/A 04/11/2020   Procedure: [place];  Surgeon: Thompson Grayer, MD;  Location: [place] CV LAB;  Service: Cardiovascular;  Laterality: N/A;   IMPLANTABLE CARDIOVERTER DEFIBRILLATOR (ICD) GENERATOR CHANGE N/A 05/05/2011   Procedure: ICD GENERATOR CHANGE;  Surgeon: Thompson Grayer, MD;  Location: [facility] CATH LAB;  Service: Cardiovascular;  Laterality: N/A;   LEFT HEART CATHETERIZATION WITH CORONARY ANGIOGRAM N/A 01/30/2011   Procedure: LEFT HEART CATHETERIZATION WITH CORONARY ANGIOGRAM;  Surgeon: Larey Dresser, MD;  Location: [facility] CATH LAB;  Service: Cardiovascular;  Laterality: N/A;   POLYPECTOMY  02/24/2018   Procedure: POLYPECTOMY;  Surgeon: Milus Banister, MD;  Location: [facility] ENDOSCOPY;  Service: Endoscopy;;   [place]    Family History  Problem Relation Age of Onset   Diabetes Mother  Heart disease Mother    Hyperlipidemia Mother    Hypertension Mother    Heart disease Father    Heart attack Father    Hypertension Father    Early death Brother 29   Breast cancer Sister    Lung cancer Sister    Irritable bowel syndrome Sister    Colon cancer Maternal Aunt    Esophageal cancer Neg Hx    Colon polyps Neg Hx     Social History:  reports that she quit smoking about 26 years ago. Her smoking use included cigarettes. She has never used smokeless tobacco. She reports that she does not drink alcohol and  does not use drugs.  Allergies:  Allergies  Allergen Reactions   Veltassa [Patiromer] Diarrhea   [place] Other (See Comments)    indigestion   Promethazine Hcl Other (See Comments)    hyperactivity   Spironolactone Diarrhea and Nausea And Vomiting   Plavix [Clopidogrel Bisulfate] Rash    No current facility-administered medications on file prior to encounter.   Current Outpatient Medications on File Prior to Encounter  Medication Sig Dispense Refill   acetaminophen (TYLENOL) 500 MG tablet Take 500 mg by mouth daily as needed for moderate pain or headache.     amiodarone (PACERONE) 100 MG tablet Take 1 tablet (100 mg total) by mouth daily. 90 tablet 3   Blood Glucose Monitoring Suppl (ACCU-CHEK NANO SMARTVIEW) W/DEVICE KIT Use to test blood sugar 4 times daily as instructed. Dx code: E11.59 1 kit 0   busPIRone (BUSPAR) 5 MG tablet Take 1 tablet (5 mg total) by mouth 2 (two) times daily. 60 tablet 1   carvedilol (COREG) 12.5 MG tablet Take 1 tablet (12.5 mg total) by mouth 2 (two) times daily with a meal. 180 tablet 3   cetirizine (ZYRTEC) 10 MG tablet Take 10 mg by mouth daily.     Cholecalciferol (VITAMIN D3) 2000 UNITS TABS Take 2,000 Units by mouth daily.     dicyclomine (BENTYL) 10 MG capsule TAKE 1 CAPSULE (10 MG TOTAL) BY MOUTH 4 (FOUR) TIMES DAILY BEFORE MEALS AND AT BEDTIME. 120 capsule 1   Dulaglutide (TRULICITY) 1.5 FW/2.6VZ SOPN Inject 1.5 mg into the skin once a week. 12 mL 3   ELIQUIS 5 MG TABS tablet TAKE 1 TABLET BY MOUTH TWICE A DAY 60 tablet 11   empagliflozin (JARDIANCE) 10 MG TABS tablet Take 10 mg by mouth daily.     ENTRESTO 49-51 MG TAKE 1 TABLET BY MOUTH TWICE A DAY 60 tablet 6   fluticasone (FLONASE) 50 MCG/ACT nasal spray Place into the nose.     furosemide (LASIX) 20 MG tablet Take 1 tablet (20 mg total) by mouth daily. 30 tablet 3   gabapentin (NEURONTIN) 100 MG capsule Take 2 capsules (200 mg total) by mouth 2 (two) times daily. 360 capsule 0   glucose blood  (ACCU-CHEK SMARTVIEW) test strip Use to test blood sugar 4 times daily as instructed. Dx code E11.59 200 each 11   insulin aspart (NOVOLOG FLEXPEN) 100 UNIT/ML FlexPen Inject up to 15 units daily under skin as advised 15 mL 11   insulin detemir (LEVEMIR FLEXTOUCH) 100 UNIT/ML FlexPen Inject 20 Units into the skin daily. 15 mL 11   magnesium oxide (MAG-OX) 400 (240 Mg) MG tablet TAKE 1 TABLET BY MOUTH TWICE A DAY 180 tablet 1   Multiple Vitamins-Minerals (EYE VITAMINS PO) Take 1 tablet by mouth 2 (two) times daily.      nitroGLYCERIN (NITROSTAT) 0.4 MG  SL tablet Place 1 tablet (0.4 mg total) under the tongue every 5 (five) minutes as needed for chest pain. 25 tablet 1   omeprazole (PRILOSEC) 20 MG capsule TAKE 1 CAPSULE BY MOUTH EVERY DAY 90 capsule 1   rosuvastatin (CRESTOR) 40 MG tablet Take 1 tablet (40 mg total) by mouth daily. 100 tablet 3   sacubitril-valsartan (ENTRESTO) 97-103 MG Take 1 tablet by mouth 2 (two) times daily. 60 tablet 3   traMADol (ULTRAM) 50 MG tablet TAKE 1 TABLET BY MOUTH EVERY 6 HOURS AS NEEDED FOR PAIN 120 tablet 2    Review of Systems: A full ROS was attempted today and was not able to be performed given aphasia and AMS.  Physical Examination: Temp:  [98.2 F (36.8 C)] 98.2 F (36.8 C) (06/10 2008) Pulse Rate:  [65] 65 (06/10 2008) Resp:  [18] 18 (06/10 2008) BP: (143)/(108) 143/108 (06/10 2008) SpO2:  [98 %] 98 % (06/10 2023)  General - well nourished, well developed, in no apparent distress.    Ophthalmologic - fundi not visualized due to noncooperation.    Cardiovascular - regular rhythm and rate, not in afib  Neuro - awake, eyelid twitching and has difficulty open eyes, after forced eye opening, pt can keep eye open for very brief time and went back to eyelid twitch. Only answered her name in severe dysarthric voice, able to repeat 3 word sentence, but not 5 word sentences, not able to name. Only followed a couple of midline commands but not peripheral  commands. Frequent perseveration of "I don't care". Eye midline, no forced gaze, visual field testing difficulty with frequent eyelid twitching. Mild left facial droop. Tongue protrusion not cooperative. Bilateral UEs no drift, but left wrist extension seems weak on the left. Bilaterally LEs 3/5, no drift on knee flexion and foot on bed position. Sensation, coordination and gait not tested. Intermittent b/l UEs postural twitching, seizure vs. [place].  NIH Stroke Scale  Level Of Consciousness 0=Alert; keenly responsive 1=Arouse to minor stimulation 2=Requires repeated stimulation to arouse or movements to pain 3=postures or unresponsive 1  LOC Questions to Month and Age 20=Answers both questions correctly 1=Answers one question correctly or dysarthria/intubated/trauma/language barrier 2=Answers neither question correctly or aphasia 2  LOC Commands      -Open/Close eyes     -Open/close grip     -Pantomime commands if communication barrier 0=Performs both tasks correctly 1=Performs one task correctly 2=Performs neighter task correctly 1  Best Gaze     -Only assess horizontal gaze 0=Normal 1=Partial gaze palsy 2=Forced deviation, or total gaze paresis 0  Visual 0=No visual loss 1=Partial hemianopia 2=Complete hemianopia 3=Bilateral hemianopia (blind including cortical blindness) 0  Facial Palsy     -Use grimace if obtunded 0=Normal symmetrical movement 1=Minor paralysis (asymmetry) 2=Partial paralysis (lower face) 3=Complete paralysis (upper and lower face) 1  Motor  0=No drift for 10/5 seconds 1=Drift, but does not hit bed 2=Some antigravity effort, hits  bed 3=No effort against gravity, limb falls 4=No movement 0=Amputation/joint fusion Right Arm 1     Leg 1    Left Arm 0     Leg 0  Limb Ataxia     - FNT/HTS 0=Absent or does not understand or paralyzed or amputation/joint fusion 1=Present in one limb 2=Present in two limbs 0  Sensory 0=Normal 1=Mild to moderate sensory  loss 2=Severe to total sensory loss or coma/unresponsive 0  Best Language 0=No aphasia, normal 1=Mild to moderate aphasia 2=Severe aphasia 3=Mute, global aphasia, or coma/unresponsive 2  Dysarthria 0=Normal 1=Mild to moderate 2=Severe, unintelligible or mute/anarthric 0=intubated/unable to test 2  Extinction/Neglect 0=No abnormality 1=visual/tactile/auditory/spatia/personal inattention/Extinction to bilateral simultaneous stimulation 2=Profound neglect/extinction more than 1 modality  1  Total   12      Data Reviewed: CT Head Wo Contrast  Result Date: 08/30/2021 CLINICAL DATA:  Neuro deficit, acute, stroke suspected EXAM: CT HEAD WITHOUT CONTRAST TECHNIQUE: Contiguous axial images were obtained from the base of the skull through the vertex without intravenous contrast. RADIATION DOSE REDUCTION: This exam was performed according to the departmental dose-optimization program which includes automated exposure control, adjustment of the mA and/or kV according to patient size and/or use of iterative reconstruction technique. COMPARISON:  07/27/2012 FINDINGS: Brain: Old right frontal infarct, stable. No acute intracranial abnormality. Specifically, no hemorrhage, hydrocephalus, mass lesion, acute infarction, or significant intracranial injury. Vascular: No hyperdense vessel or unexpected calcification. Skull: No acute calvarial abnormality. Sinuses/Orbits: No acute findings Other: None IMPRESSION: Old right frontal infarct, stable. No acute intracranial abnormality. Electronically Signed   By: Rolm Baptise M.D.   On: 08/30/2021 21:08    Assessment: 76 y.o. female with PMH of CAD s/p PCI and stents, cardiomyopathy, chronic CHF, VT with ICD, AFib on eliquis, stroke, DM, HTN, HLD, CKD (III) presented to ED for lethargy and generalized weakness since 10am. Per report pt also seems to have increased weakness on the left side and pt leaning towards left. However pt mental status fluctuating, was reported  AAO x 4 but on my exam, pt barely following commands, perseveration, moving extremities but aphasic with CT head showed old right frontal infarct but no acute finding. CTA head and neck right mid M3 occlusion associated with old right frontal infarct. No CTP give difficulty IV access. Pt not TNK candidate due to outside window and on eliquis. Not IR candidate due to no LVO, the right mid M3 occlusion likely chronic.  Pt symptoms concerning for seizure, with fluctuating mental status and disorientation, with eyelid twitching and intermittent body twitching. Will give ativan $RemoveBef'2mg'FwlnhpEecV$ . If not getting better, will give additional $RemoveBeforeD'2mg'[id number]$ . Will do stat EEG  DDx including stroke and encephalopathy. Given pt hx of stroke and afib although on eliquis (not sure about compliance though), will do MRI brain to evaluate if her ICD is compatible with MRI. Given ASA PR. Will start encephalopathy work up also.   Stroke Risk Factors - atrial fibrillation, diabetes mellitus, hyperlipidemia, and hypertension  Plan: Recommend internal medication admission for further management Need to contact family to obtain more information  Continue further stroke and seizure work up  Frequent neuro checks Telemetry monitoring MRI brain if her ICD is compatible with MRI EEG stat Echocardiogram  UDS, fasting lipid panel and HgbA1C PT/OT/speech consult Hold off eliquis for now and give ASA PR. Discussed with Dr. Dorina Hoyer ED physician We will follow   Thank you for this consultation and allowing [name] to participate in the care of this patient.  Rosalin Hawking, MD PhD Stroke Neurology 08/31/2021 12:09 AM

## 2021-08-30 NOTE — ED Notes (Signed)
Pt transported to CT with this RN 

## 2021-08-31 ENCOUNTER — Inpatient Hospital Stay (HOSPITAL_COMMUNITY): Payer: Medicare HMO

## 2021-08-31 ENCOUNTER — Encounter (HOSPITAL_COMMUNITY): Payer: Self-pay | Admitting: Internal Medicine

## 2021-08-31 DIAGNOSIS — E1129 Type 2 diabetes mellitus with other diabetic kidney complication: Secondary | ICD-10-CM | POA: Diagnosis present

## 2021-08-31 DIAGNOSIS — N1831 Chronic kidney disease, stage 3a: Secondary | ICD-10-CM | POA: Diagnosis not present

## 2021-08-31 DIAGNOSIS — G9340 Encephalopathy, unspecified: Secondary | ICD-10-CM | POA: Diagnosis not present

## 2021-08-31 DIAGNOSIS — I69398 Other sequelae of cerebral infarction: Secondary | ICD-10-CM | POA: Diagnosis not present

## 2021-08-31 DIAGNOSIS — E669 Obesity, unspecified: Secondary | ICD-10-CM | POA: Diagnosis present

## 2021-08-31 DIAGNOSIS — Z83438 Family history of other disorder of lipoprotein metabolism and other lipidemia: Secondary | ICD-10-CM | POA: Diagnosis not present

## 2021-08-31 DIAGNOSIS — I10 Essential (primary) hypertension: Secondary | ICD-10-CM | POA: Diagnosis not present

## 2021-08-31 DIAGNOSIS — N1832 Chronic kidney disease, stage 3b: Secondary | ICD-10-CM

## 2021-08-31 DIAGNOSIS — R569 Unspecified convulsions: Secondary | ICD-10-CM | POA: Diagnosis present

## 2021-08-31 DIAGNOSIS — I2510 Atherosclerotic heart disease of native coronary artery without angina pectoris: Secondary | ICD-10-CM | POA: Diagnosis present

## 2021-08-31 DIAGNOSIS — E039 Hypothyroidism, unspecified: Secondary | ICD-10-CM | POA: Diagnosis present

## 2021-08-31 DIAGNOSIS — R4701 Aphasia: Secondary | ICD-10-CM | POA: Diagnosis present

## 2021-08-31 DIAGNOSIS — E785 Hyperlipidemia, unspecified: Secondary | ICD-10-CM | POA: Diagnosis present

## 2021-08-31 DIAGNOSIS — Z8249 Family history of ischemic heart disease and other diseases of the circulatory system: Secondary | ICD-10-CM | POA: Diagnosis not present

## 2021-08-31 DIAGNOSIS — I130 Hypertensive heart and chronic kidney disease with heart failure and stage 1 through stage 4 chronic kidney disease, or unspecified chronic kidney disease: Secondary | ICD-10-CM | POA: Diagnosis present

## 2021-08-31 DIAGNOSIS — G9341 Metabolic encephalopathy: Secondary | ICD-10-CM | POA: Diagnosis present

## 2021-08-31 DIAGNOSIS — Z87891 Personal history of nicotine dependence: Secondary | ICD-10-CM | POA: Diagnosis not present

## 2021-08-31 DIAGNOSIS — Z888 Allergy status to other drugs, medicaments and biological substances status: Secondary | ICD-10-CM | POA: Diagnosis not present

## 2021-08-31 DIAGNOSIS — R4182 Altered mental status, unspecified: Secondary | ICD-10-CM | POA: Diagnosis not present

## 2021-08-31 DIAGNOSIS — E1122 Type 2 diabetes mellitus with diabetic chronic kidney disease: Secondary | ICD-10-CM

## 2021-08-31 DIAGNOSIS — Z79899 Other long term (current) drug therapy: Secondary | ICD-10-CM | POA: Diagnosis not present

## 2021-08-31 DIAGNOSIS — Z833 Family history of diabetes mellitus: Secondary | ICD-10-CM | POA: Diagnosis not present

## 2021-08-31 DIAGNOSIS — G4730 Sleep apnea, unspecified: Secondary | ICD-10-CM | POA: Diagnosis present

## 2021-08-31 DIAGNOSIS — R531 Weakness: Secondary | ICD-10-CM | POA: Diagnosis present

## 2021-08-31 DIAGNOSIS — M1711 Unilateral primary osteoarthritis, right knee: Secondary | ICD-10-CM | POA: Diagnosis present

## 2021-08-31 DIAGNOSIS — Z7901 Long term (current) use of anticoagulants: Secondary | ICD-10-CM | POA: Diagnosis not present

## 2021-08-31 DIAGNOSIS — I480 Paroxysmal atrial fibrillation: Secondary | ICD-10-CM | POA: Diagnosis present

## 2021-08-31 DIAGNOSIS — I5022 Chronic systolic (congestive) heart failure: Secondary | ICD-10-CM | POA: Diagnosis present

## 2021-08-31 DIAGNOSIS — I255 Ischemic cardiomyopathy: Secondary | ICD-10-CM | POA: Diagnosis present

## 2021-08-31 DIAGNOSIS — Z95810 Presence of automatic (implantable) cardiac defibrillator: Secondary | ICD-10-CM | POA: Diagnosis not present

## 2021-08-31 LAB — TSH: TSH: 2.518 u[IU]/mL (ref 0.350–4.500)

## 2021-08-31 LAB — CBC
HCT: 42.5 % (ref 36.0–46.0)
Hemoglobin: 13.3 g/dL (ref 12.0–15.0)
MCH: 28.8 pg (ref 26.0–34.0)
MCHC: 31.3 g/dL (ref 30.0–36.0)
MCV: 92 fL (ref 80.0–100.0)
Platelets: 225 10*3/uL (ref 150–400)
RBC: 4.62 MIL/uL (ref 3.87–5.11)
RDW: 15.1 % (ref 11.5–15.5)
WBC: 7.8 10*3/uL (ref 4.0–10.5)
nRBC: 0 % (ref 0.0–0.2)

## 2021-08-31 LAB — ECHOCARDIOGRAM COMPLETE
AR max vel: 2.19 cm2
AV Peak grad: 7.2 mmHg
Ao pk vel: 1.34 m/s
Area-P 1/2: 4.12 cm2
Height: 63 in
P 1/2 time: 368 msec
S' Lateral: 4.6 cm
Weight: 2848 oz

## 2021-08-31 LAB — LIPID PANEL
Cholesterol: 159 mg/dL (ref 0–200)
HDL: 64 mg/dL (ref >40)
LDL Cholesterol: 81 mg/dL (ref 0–99)
Total CHOL/HDL Ratio: 2.5 RATIO
Triglycerides: 69 mg/dL (ref <150)
VLDL: 14 mg/dL (ref 0–40)

## 2021-08-31 LAB — RAPID URINE DRUG SCREEN, HOSP PERFORMED
Amphetamines: NOT DETECTED
Barbiturates: NOT DETECTED
Benzodiazepines: POSITIVE — AB
Cocaine: NOT DETECTED
Opiates: NOT DETECTED
Tetrahydrocannabinol: POSITIVE — AB

## 2021-08-31 LAB — URINALYSIS, MICROSCOPIC (REFLEX)

## 2021-08-31 LAB — GLUCOSE, CAPILLARY
Glucose-Capillary: 96 mg/dL (ref 70–99)
Glucose-Capillary: 90 mg/dL (ref 70–99)
Glucose-Capillary: 98 mg/dL (ref 70–99)
Glucose-Capillary: 104 mg/dL — ABNORMAL HIGH (ref 70–99)

## 2021-08-31 LAB — CREATININE, SERUM
Creatinine, Ser: 1.6 mg/dL — ABNORMAL HIGH (ref 0.44–1.00)
GFR, Estimated: 34 mL/min — ABNORMAL LOW (ref >60)

## 2021-08-31 LAB — URINALYSIS, ROUTINE W REFLEX MICROSCOPIC
Bilirubin Urine: NEGATIVE
Glucose, UA: >=500 mg/dL — AB
Ketones, ur: NEGATIVE mg/dL
Nitrite: NEGATIVE
Protein, ur: NEGATIVE mg/dL
Specific Gravity, Urine: 1.02 (ref 1.005–1.030)
pH: 5.5 (ref 5.0–8.0)

## 2021-08-31 LAB — CBG MONITORING, ED
Glucose-Capillary: 143 mg/dL — ABNORMAL HIGH (ref 70–99)
Glucose-Capillary: 141 mg/dL — ABNORMAL HIGH (ref 70–99)
Glucose-Capillary: 128 mg/dL — ABNORMAL HIGH (ref 70–99)

## 2021-08-31 LAB — PROTIME-INR
INR: 1.5 — ABNORMAL HIGH (ref 0.8–1.2)
Prothrombin Time: 18.1 seconds — ABNORMAL HIGH (ref 11.4–15.2)

## 2021-08-31 LAB — APTT: aPTT: 32 seconds (ref 24–36)

## 2021-08-31 MED ORDER — ASPIRIN 325 MG PO TABS
325.0000 mg | ORAL_TABLET | Freq: Every day | ORAL | Status: DC
  Administered 2021-09-01: 325 mg via ORAL
  Filled 2021-08-31: qty 1

## 2021-08-31 MED ORDER — LABETALOL HCL 5 MG/ML IV SOLN
10.0000 mg | INTRAVENOUS | Status: DC | PRN
Start: 2021-08-31 — End: 2021-09-03

## 2021-08-31 MED ORDER — LEVETIRACETAM IN NACL 500 MG/100ML IV SOLN
500.0000 mg | Freq: Two times a day (BID) | INTRAVENOUS | Status: DC
  Administered 2021-08-31 – 2021-09-01 (×3): 500 mg via INTRAVENOUS
  Filled 2021-08-31 (×3): qty 100

## 2021-08-31 MED ORDER — ASPIRIN 300 MG RE SUPP
300.0000 mg | Freq: Every day | RECTAL | Status: DC
  Administered 2021-08-31: 300 mg via RECTAL
  Filled 2021-08-31: qty 1

## 2021-08-31 MED ORDER — SODIUM CHLORIDE 0.9 % IV SOLN
2000.0000 mg | Freq: Once | INTRAVENOUS | Status: AC
  Administered 2021-08-31: 2000 mg via INTRAVENOUS
  Filled 2021-08-31: qty 20

## 2021-08-31 MED ORDER — ENOXAPARIN SODIUM 30 MG/0.3ML IJ SOSY
30.0000 mg | PREFILLED_SYRINGE | INTRAMUSCULAR | Status: DC
  Administered 2021-08-31: 30 mg via SUBCUTANEOUS
  Filled 2021-08-31: qty 0.3

## 2021-08-31 MED ORDER — ACETAMINOPHEN 325 MG PO TABS
650.0000 mg | ORAL_TABLET | ORAL | Status: DC | PRN
  Administered 2021-08-31 – 2021-09-02 (×3): 650 mg via ORAL
  Filled 2021-08-31 (×3): qty 2

## 2021-08-31 MED ORDER — INSULIN ASPART 100 UNIT/ML IJ SOLN
0.0000 [IU] | INTRAMUSCULAR | Status: DC
  Administered 2021-09-01: 1 [IU] via SUBCUTANEOUS
  Administered 2021-09-01 (×2): 2 [IU] via SUBCUTANEOUS
  Administered 2021-09-02: 1 [IU] via SUBCUTANEOUS
  Administered 2021-09-02: 2 [IU] via SUBCUTANEOUS
  Administered 2021-09-02 – 2021-09-03 (×2): 1 [IU] via SUBCUTANEOUS

## 2021-08-31 MED ORDER — MORPHINE SULFATE (PF) 2 MG/ML IV SOLN
2.0000 mg | INTRAVENOUS | Status: DC | PRN
  Administered 2021-08-31 – 2021-09-01 (×3): 2 mg via INTRAVENOUS
  Administered 2021-09-01 – 2021-09-02 (×3): 4 mg via INTRAVENOUS
  Administered 2021-09-02: 2 mg via INTRAVENOUS
  Filled 2021-08-31 (×3): qty 1
  Filled 2021-08-31 (×2): qty 2
  Filled 2021-08-31: qty 1
  Filled 2021-08-31: qty 2

## 2021-08-31 MED ORDER — ACETAMINOPHEN 325 MG PO TABS: 650.0000 mg | ORAL_TABLET | ORAL | Status: DC | PRN

## 2021-08-31 MED ORDER — STROKE: EARLY STAGES OF RECOVERY BOOK: Freq: Once | Status: DC

## 2021-08-31 MED ORDER — ACETAMINOPHEN 160 MG/5ML PO SOLN: 650.0000 mg | ORAL | Status: DC | PRN

## 2021-08-31 MED ORDER — INSULIN ASPART 100 UNIT/ML IJ SOLN
0.0000 [IU] | INTRAMUSCULAR | Status: DC
  Administered 2021-08-31 (×2): 1 [IU] via SUBCUTANEOUS

## 2021-08-31 MED ORDER — ACETAMINOPHEN 650 MG RE SUPP: 650.0000 mg | RECTAL | Status: DC | PRN

## 2021-08-31 MED ORDER — ACETAMINOPHEN 650 MG RE SUPP
650.0000 mg | Freq: Once | RECTAL | Status: AC
  Administered 2021-08-31: 650 mg via RECTAL
  Filled 2021-08-31: qty 1

## 2021-08-31 NOTE — ED Notes (Signed)
Pt is connected to continuous EEG, laying on right side. Moaning in response to questions. IV intact in right [facility]

## 2021-08-31 NOTE — Progress Notes (Signed)
Patient responds to to verbal stimuli, she has confusion and is poor historian. This nurse did speak wit patients son Aaron Edelman and minimal information was obtained. Patient just began living with son approximately 1 year ago. Patient made comfortable in bed, bed alarm is on and functioning. Telesitter is in room and plugged in, this nurse attempted to call telesitter at [phone] twice but no answer. Continuous EEG in progress.

## 2021-08-31 NOTE — ED Notes (Signed)
Pt axo to self only, when asked if she was in pain stated "yes, leg". Unable to communicate anything else. Hal Hope made aware.

## 2021-08-31 NOTE — ED Notes (Signed)
Pts son updated, [phone]

## 2021-08-31 NOTE — Progress Notes (Signed)
LTM EEG hooked up and running - no initial skin breakdown - push button tested - neuro notified. No [facility] monitoring. Patient is in the ED currently

## 2021-08-31 NOTE — ED Notes (Signed)
ECHO at bedside.

## 2021-08-31 NOTE — Assessment & Plan Note (Addendum)
-   entresto resumed

## 2021-08-31 NOTE — H&P (Signed)
History and Physical    [name]:[id number] DOB: [birth year] DOA: 08/30/2021  PCP: Pcp, No  Patient coming from: Home.  Chief Complaint: Lethargy.  History obtained from ER physician and neurologist.  HPI: [name] is a 76 y.o. female with history of chronic systolic heart failure status post ICD placement, history of A-fib, diabetes mellitus type 2, hypertension, CAD, chronic kidney disease stage III was found to be increasingly lethargic since yesterday at 105 AM.  Was brought to the ER.  ED Course: In the ER patient was found to be confused and also having some twitching movements of the eye concerning for seizures.  Patient also was found to be mildly weak on the left side.  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  Patient admitted for further work-up of possible stroke versus seizure and EEG ordered and neurology has already evaluated patient and started on Keppra and Ativan was given.  Review of Systems: As per HPI, rest all negative.   Past Medical History:  Diagnosis Date   Allergy    Arthritis    Atrial fibrillation ([place])    controlled with amiodarone, on coumadin   Chronic renal insufficiency    Chronic systolic heart failure (HCC)    Congestive heart failure ([place])    Coronary artery disease 01/31/2011   Diabetes mellitus    type 1   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    History of chicken pox    Hyperlipidemia    Hypertension    Ischemic cardiomyopathy    Lumbar spondylosis 01/11/2012   Sleep apnea    Stroke [facility]) 2010   eye doctor said she had TIA   Ventricular tachycardia ([place])    Polymorphic    Past Surgical History:  Procedure Laterality Date   [place]   COLONOSCOPY WITH PROPOFOL N/A 02/24/2018   Procedure: COLONOSCOPY WITH PROPOFOL;  Surgeon: Milus Banister, MD;   Location: [facility] ENDOSCOPY;  Service: Endoscopy;  Laterality: N/A;   ICD  2003/2007   implanted by Dr Rollene Fare, most recent generator change 2/13 by Dr Rayann Heman, Analyze ST study patient   ICD GENERATOR CHANGEOUT N/A 04/11/2020   Procedure: [place];  Surgeon: Thompson Grayer, MD;  Location: [place] CV LAB;  Service: Cardiovascular;  Laterality: N/A;   IMPLANTABLE CARDIOVERTER DEFIBRILLATOR (ICD) GENERATOR CHANGE N/A 05/05/2011   Procedure: ICD GENERATOR CHANGE;  Surgeon: Thompson Grayer, MD;  Location: [facility] CATH LAB;  Service: Cardiovascular;  Laterality: N/A;   LEFT HEART CATHETERIZATION WITH CORONARY ANGIOGRAM N/A 01/30/2011   Procedure: LEFT HEART CATHETERIZATION WITH CORONARY ANGIOGRAM;  Surgeon: Larey Dresser, MD;  Location: [facility] CATH LAB;  Service: Cardiovascular;  Laterality: N/A;   POLYPECTOMY  02/24/2018   Procedure: POLYPECTOMY;  Surgeon: Milus Banister, MD;  Location: [facility] ENDOSCOPY;  Service: Endoscopy;;   [place]     reports that she quit smoking about 26 years ago. Her smoking use included cigarettes. She has never used smokeless tobacco. She reports that she does not drink alcohol and does not use drugs.  Allergies  Allergen Reactions   Veltassa [Patiromer] Diarrhea   [place] Other (See Comments)    indigestion   Promethazine Hcl Other (See Comments)    hyperactivity   Spironolactone Diarrhea and Nausea And Vomiting   Plavix [Clopidogrel Bisulfate] Rash  Family History  Problem Relation Age of Onset   Diabetes Mother    Heart disease Mother    Hyperlipidemia Mother    Hypertension Mother    Heart disease Father    Heart attack Father    Hypertension Father    Early death Brother 15   Breast cancer Sister    Lung cancer Sister    Irritable bowel syndrome Sister    Colon cancer Maternal Aunt    Esophageal cancer Neg Hx    Colon polyps Neg Hx     Prior to Admission medications   Medication Sig Start Date End Date Taking? Authorizing Provider   acetaminophen (TYLENOL) 500 MG tablet Take 500 mg by mouth daily as needed for moderate pain or headache.    [provider]  amiodarone (PACERONE) 100 MG tablet Take 1 tablet (100 mg total) by mouth daily. 08/19/21   Larey Dresser, MD  Blood Glucose Monitoring Suppl (ACCU-CHEK NANO SMARTVIEW) W/DEVICE KIT Use to test blood sugar 4 times daily as instructed. Dx code: E11.59 03/01/14   Philemon Kingdom, MD  busPIRone (BUSPAR) 5 MG tablet Take 1 tablet (5 mg total) by mouth 2 (two) times daily. 05/20/21   Rafael Bihari, FNP  carvedilol (COREG) 12.5 MG tablet Take 1 tablet (12.5 mg total) by mouth 2 (two) times daily with a meal. 10/09/20   Larey Dresser, MD  cetirizine (ZYRTEC) 10 MG tablet Take 10 mg by mouth daily.    [provider]  Cholecalciferol (VITAMIN D3) 2000 UNITS TABS Take 2,000 Units by mouth daily.    [provider]  dicyclomine (BENTYL) 10 MG capsule TAKE 1 CAPSULE (10 MG TOTAL) BY MOUTH 4 (FOUR) TIMES DAILY BEFORE MEALS AND AT BEDTIME. 02/09/20   Biagio Borg, MD  Dulaglutide (TRULICITY) 1.5 ZC/5.8IF SOPN Inject 1.5 mg into the skin once a week. 04/02/21   Philemon Kingdom, MD  ELIQUIS 5 MG TABS tablet TAKE 1 TABLET BY MOUTH TWICE A DAY Patient taking differently: Take 5 mg by mouth 2 (two) times daily. 11/26/20   Larey Dresser, MD  empagliflozin (JARDIANCE) 10 MG TABS tablet Take 10 mg by mouth daily.    [provider]  ENTRESTO 49-51 MG TAKE 1 TABLET BY MOUTH TWICE A DAY Patient taking differently: Take 1 tablet by mouth 2 (two) times daily. 07/03/21   Larey Dresser, MD  fluticasone Asencion Islam) 50 MCG/ACT nasal spray Place into the nose. 03/13/19 10/09/21  [provider]  furosemide (LASIX) 20 MG tablet Take 1 tablet (20 mg total) by mouth daily. 05/20/21   [place], Maricela Bo, FNP  gabapentin (NEURONTIN) 100 MG capsule Take 2 capsules (200 mg total) by mouth 2 (two) times daily. 08/22/21   Philemon Kingdom, MD  glucose blood  (ACCU-CHEK SMARTVIEW) test strip Use to test blood sugar 4 times daily as instructed. Dx code E11.59 07/19/14   Philemon Kingdom, MD  insulin aspart (NOVOLOG FLEXPEN) 100 UNIT/ML FlexPen Inject up to 15 units daily under skin as advised 04/02/21   Philemon Kingdom, MD  insulin detemir (LEVEMIR FLEXTOUCH) 100 UNIT/ML FlexPen Inject 20 Units into the skin daily. 04/02/21   Philemon Kingdom, MD  magnesium oxide (MAG-OX) 400 (240 Mg) MG tablet TAKE 1 TABLET BY MOUTH TWICE A DAY 12/18/20   Biagio Borg, MD  Multiple Vitamins-Minerals (EYE VITAMINS PO) Take 1 tablet by mouth 2 (two) times daily.     [provider]  nitroGLYCERIN (NITROSTAT) 0.4 MG SL tablet Place  1 tablet (0.4 mg total) under the tongue every 5 (five) minutes as needed for chest pain. 05/20/21   Jacklynn Ganong, FNP  omeprazole (PRILOSEC) 20 MG capsule TAKE 1 CAPSULE BY MOUTH EVERY DAY 02/09/20   Corwin Levins, MD  rosuvastatin (CRESTOR) 40 MG tablet Take 1 tablet (40 mg total) by mouth daily. 09/26/20   Laurey Morale, MD  sacubitril-valsartan (ENTRESTO) 97-103 MG Take 1 tablet by mouth 2 (two) times daily. 01/14/21   Laurey Morale, MD  traMADol (ULTRAM) 50 MG tablet TAKE 1 TABLET BY MOUTH EVERY 6 HOURS AS NEEDED FOR PAIN Patient taking differently: Take 50 mg by mouth every 6 (six) hours as needed for moderate pain. 03/03/19   Corwin Levins, MD    Physical Exam: Constitutional: Moderately built and nourished. Vitals:   08/30/21 2023 08/30/21 2140 08/30/21 2330 08/31/21 0000  BP:   (!) 166/86 (!) 137/56  Pulse:  66 65 61  Resp:  12 17 15   Temp:   99.8 F (37.7 C)   TempSrc:   Oral   SpO2: 98%  98% 98%   Eyes: Anicteric no pallor. ENMT: No discharge from the ears eyes nose and mouth. Neck: No mass felt.  No neck rigidity. Respiratory: No rhonchi or crepitations. Cardiovascular: S1-S2 heard. Abdomen: Soft nontender bowel sound present. Musculoskeletal: No edema. Skin: No rash. Neurologic: Patient is lethargic  minimally responsive pupils reacting to light. Psychiatric: Lethargic.   Labs on Admission: I have personally reviewed following labs and imaging studies  CBC: Recent Labs  Lab 08/30/21 2105  WBC 9.2  NEUTROABS 6.7  HGB 13.6  HCT 44.2  MCV 94.6  PLT 213   Basic Metabolic Panel: Recent Labs  Lab 08/30/21 2105  NA 142  K 4.3  CL 108  CO2 21*  GLUCOSE 152*  BUN 33*  CREATININE 1.78*  CALCIUM 9.0   GFR: CrCl cannot be calculated (Unknown ideal weight.). Liver Function Tests: No results for input(s): "AST", "ALT", "ALKPHOS", "BILITOT", "PROT", "ALBUMIN" in the last 168 hours. No results for input(s): "LIPASE", "AMYLASE" in the last 168 hours. No results for input(s): "AMMONIA" in the last 168 hours. Coagulation Profile: Recent Labs  Lab 08/31/21 0103  INR 1.5*   Cardiac Enzymes: No results for input(s): "CKTOTAL", "CKMB", "CKMBINDEX", "TROPONINI" in the last 168 hours. BNP (last 3 results) No results for input(s): "PROBNP" in the last 8760 hours. HbA1C: No results for input(s): "HGBA1C" in the last 72 hours. CBG: Recent Labs  Lab 08/30/21 2146  GLUCAP 145*   Lipid Profile: No results for input(s): "CHOL", "HDL", "LDLCALC", "TRIG", "CHOLHDL", "LDLDIRECT" in the last 72 hours. Thyroid Function Tests: No results for input(s): "TSH", "T4TOTAL", "FREET4", "T3FREE", "THYROIDAB" in the last 72 hours. Anemia Panel: No results for input(s): "VITAMINB12", "FOLATE", "FERRITIN", "TIBC", "IRON", "RETICCTPCT" in the last 72 hours. Urine analysis:    Component Value Date/Time   COLORURINE YELLOW 08/16/2018 1312   APPEARANCEUR Sl Cloudy (A) 08/16/2018 1312   LABSPEC 1.015 08/16/2018 1312   PHURINE 7.5 08/16/2018 1312   GLUCOSEU NEGATIVE 08/16/2018 1312   HGBUR NEGATIVE 08/16/2018 1312   BILIRUBINUR NEGATIVE 08/16/2018 1312   KETONESUR NEGATIVE 08/16/2018 1312   PROTEINUR 30 (A) 07/31/2013 2140   UROBILINOGEN 1.0 08/16/2018 1312   NITRITE NEGATIVE 08/16/2018 1312    LEUKOCYTESUR SMALL (A) 08/16/2018 1312   Sepsis Labs: @LABRCNTIP (procalcitonin:4,lacticidven:4) )No results found for this or any previous visit (from the past 240 hour(s)).   Radiological Exams on Admission: CT ANGIO  HEAD NECK W WO CM  Result Date: 08/30/2021 CLINICAL DATA:  Acute neurologic deficit EXAM: CT ANGIOGRAPHY HEAD AND NECK TECHNIQUE: Multidetector CT imaging of the head and neck was performed using the standard protocol during bolus administration of intravenous contrast. Multiplanar CT image reconstructions and MIPs were obtained to evaluate the vascular anatomy. Carotid stenosis measurements (when applicable) are obtained utilizing NASCET criteria, using the distal internal carotid diameter as the denominator. RADIATION DOSE REDUCTION: This exam was performed according to the departmental dose-optimization program which includes automated exposure control, adjustment of the mA and/or kV according to patient size and/or use of iterative reconstruction technique. CONTRAST:  88mL OMNIPAQUE IOHEXOL 350 MG/ML SOLN COMPARISON:  None Available. FINDINGS: CTA NECK FINDINGS SKELETON: There is no bony spinal canal stenosis. No lytic or blastic lesion. OTHER NECK: Normal pharynx, larynx and major salivary glands. No cervical lymphadenopathy. Unremarkable thyroid gland. UPPER CHEST: No pneumothorax or pleural effusion. No nodules or masses. AORTIC ARCH: There is calcific atherosclerosis of the aortic arch. There is no aneurysm, dissection or hemodynamically significant stenosis of the visualized portion of the aorta. Conventional 3 vessel aortic branching pattern. The visualized proximal subclavian arteries are widely patent. RIGHT CAROTID SYSTEM: Normal without aneurysm, dissection or stenosis. LEFT CAROTID SYSTEM: Normal without aneurysm, dissection or stenosis. VERTEBRAL ARTERIES: Left dominant configuration. Both origins are clearly patent. There is no dissection, occlusion or flow-limiting stenosis  to the skull base (V1-V3 segments). CTA HEAD FINDINGS POSTERIOR CIRCULATION: --Vertebral arteries: Normal V4 segments. --Inferior cerebellar arteries: Normal. --Basilar artery: Normal. --Superior cerebellar arteries: Normal. --Posterior cerebral arteries (PCA): Normal. ANTERIOR CIRCULATION: --Intracranial internal carotid arteries: Atherosclerotic calcification of the internal carotid arteries at the skull base without hemodynamically significant stenosis. --Anterior cerebral arteries (ACA): Normal. Both A1 segments are present. Patent anterior communicating artery (a-comm). --Middle cerebral arteries (MCA): Normal. VENOUS SINUSES: As permitted by contrast timing, patent. ANATOMIC VARIANTS: None Review of the MIP images confirms the above findings. IMPRESSION: 1. No emergent large vessel occlusion or high-grade stenosis of the intracranial or cervical arteries. 2. Aortic Atherosclerosis (ICD10-I70.0). Electronically Signed   By: Ulyses Jarred M.D.   On: 08/30/2021 22:38   CT Head Wo Contrast  Result Date: 08/30/2021 CLINICAL DATA:  Neuro deficit, acute, stroke suspected EXAM: CT HEAD WITHOUT CONTRAST TECHNIQUE: Contiguous axial images were obtained from the base of the skull through the vertex without intravenous contrast. RADIATION DOSE REDUCTION: This exam was performed according to the departmental dose-optimization program which includes automated exposure control, adjustment of the mA and/or kV according to patient size and/or use of iterative reconstruction technique. COMPARISON:  07/27/2012 FINDINGS: Brain: Old right frontal infarct, stable. No acute intracranial abnormality. Specifically, no hemorrhage, hydrocephalus, mass lesion, acute infarction, or significant intracranial injury. Vascular: No hyperdense vessel or unexpected calcification. Skull: No acute calvarial abnormality. Sinuses/Orbits: No acute findings Other: None IMPRESSION: Old right frontal infarct, stable. No acute intracranial  abnormality. Electronically Signed   By: Rolm Baptise M.D.   On: 08/30/2021 21:08    EKG: Independently reviewed.  Normal sinus rhythm.  Assessment/Plan Principal Problem:   Acute encephalopathy Active Problems:   Implantable cardioverter-defibrillator (ICD) in situ   Ischemic cardiomyopathy   HTN (hypertension)   Chronic systolic CHF (congestive heart failure) (HCC)   Hypothyroidism   DM (diabetes mellitus), type 2 with renal complications (HCC)    Acute encephalopathy differentials include possible seizure as well as stroke.  Appreciate neurology consult.  MRI brain has been ordered since patient has ICD will need further assessment.  Check 2D echo continuous EEG monitoring seizure precautions started on Keppra by neurology.  Aspirin for now holding Eliquis due to possible stroke.  Speech therapy evaluation physical therapy consult.  Check lipid panel hemoglobin A1c. Diabetes mellitus type 2 we will keep patient on sliding scale coverage.  Check hemoglobin A1c.  Used to take long-acting insulin. Chronic systolic heart failure status post ICD placement appears compensated.  Closely monitor.  Dyspnea on IV fluids. Chronic kidney disease stage III creatinine appears to be at baseline.  Since patient has concerns for possible seizure with stroke will need close monitoring and further management inpatient status.   DVT prophylaxis: Lovenox. Code Status: Full code. Family Communication: Will need to discuss with family Disposition Plan: Home. Consults called: Neurology. Admission status: Inpatient.   Rise Patience MD [facility] Pager [phone].  If 7PM-7AM, please contact night-coverage [email or web address] Password [email or web address]  08/31/2021, 2:12 AM

## 2021-08-31 NOTE — ED Notes (Signed)
In and out cath done  for 10cc cloudy urine-- no other urine drained. Purewick placed. Rectal temp 96.3-- warm blankets placed

## 2021-08-31 NOTE — Progress Notes (Signed)
[facility] floor coverage note: Pt having severe R knee pain that she indicates is new. No skin discoloration, erythema visible on my exam.  1) getting X ray of R knee 2) morphine PRN.

## 2021-08-31 NOTE — Assessment & Plan Note (Signed)
-  patient has history of CKD3b. Baseline creat ~ 1.4 - 1.6, eGFR 30-35 - currently at baseline

## 2021-08-31 NOTE — Progress Notes (Signed)
Progress Note    TIAUNA WHISNANT   ZJQ:[id number]  DOB: 27-Sep-1945  DOA: 08/30/2021     0 PCP: Pcp, No  Initial CC: lethargy, abnormal eye movement, left side weakness  Hospital Course: Ms. Daniely is a 76 yo female with PMH afib, sCHF, vtach s/p ICD, DMII, CAD, HTN, HLD, CVA, sleep apnea who presented to the ER with increased lethargy.  She was found to be confused with twitching eye movements concerning for seizures and some mild weakness on the left side. She was admitted for stroke work-up and also underwent EEG monitoring.  EEG showed periodic discharges in the frontal region and she was converted to LTM and started on Ativan challenge.  She was also loaded with Keppra. 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  Neurology was also consulted on admission.  Interval History:  Seen this morning in the ER.  Minimally interactive. Could not follow commands, mostly moaning.   Assessment and Plan: * Acute metabolic encephalopathy - Differential on admission includes seizure versus acute CVA -MRI brain unable to be performed until Monday after ICD can be further evaluated for compatibility - Follow-up EEG monitoring and neurology recommendations -Continue neurochecks  Hypothyroidism - Follow-up med rec, no Synthroid listed - Check TSH  Chronic kidney disease, stage 3b ([place]) - patient has history of CKD3b. Baseline creat ~ 1.4 - 1.6, eGFR 30-35 - currently at baseline    DM (diabetes mellitus), type 2 with renal complications (HCC) - Continue SSI and CBG monitoring  Chronic systolic CHF (congestive heart failure) (HCC) - no s/s exacerbation - ICD in place   HTN (hypertension) - Await med rec then will resume home meds as indicated    Old records reviewed in assessment of this patient  Antimicrobials:   DVT prophylaxis:  enoxaparin (LOVENOX) injection 30 mg Start: 08/31/21 1400   Code Status:   Code  Status: Full Code  Disposition Plan:  pending clinical course Status is: Inpt  Objective: Blood pressure (!) 151/68, pulse 64, temperature 97.7 F (36.5 C), temperature source Oral, resp. rate 13, height $RemoveBe'5\' 3"'akhfOApWc$  (1.6 m), weight 80.7 kg, SpO2 100 %.  Examination:  Physical Exam Constitutional:      Comments: Minimally interactive and laying in bed with minimal responsiveness.  Moves all 4 extremities not purposefully but will not follow commands  HENT:     Head: Normocephalic and atraumatic.     Mouth/Throat:     Mouth: Mucous membranes are moist.  Eyes:     Pupils: Pupils are equal, round, and reactive to light.  Cardiovascular:     Rate and Rhythm: Normal rate and regular rhythm.  Pulmonary:     Effort: Pulmonary effort is normal.     Breath sounds: Normal breath sounds.  Abdominal:     General: Bowel sounds are normal. There is no distension.     Palpations: Abdomen is soft.  Musculoskeletal:        General: No swelling.     Cervical back: No rigidity.  Skin:    General: Skin is warm and dry.  Neurological:     Comments: Nonpurposefully moving all 4 extremities      Consultants:  Neurology  Procedures:    Data Reviewed: Results for orders placed or performed during the hospital encounter of 08/30/21 (from the past 24 hour(s))  CBC with Differential     Status: None   Collection Time: 08/30/21  9:05 PM  Result Value  Ref Range   WBC 9.2 4.0 - 10.5 K/uL   RBC 4.67 3.87 - 5.11 MIL/uL   Hemoglobin 13.6 12.0 - 15.0 g/dL   HCT 44.2 36.0 - 46.0 %   MCV 94.6 80.0 - 100.0 fL   MCH 29.1 26.0 - 34.0 pg   MCHC 30.8 30.0 - 36.0 g/dL   RDW 15.2 11.5 - 15.5 %   Platelets 213 150 - 400 K/uL   nRBC 0.0 0.0 - 0.2 %   Neutrophils Relative % 72 %   Neutro Abs 6.7 1.7 - 7.7 K/uL   Lymphocytes Relative 18 %   Lymphs Abs 1.6 0.7 - 4.0 K/uL   Monocytes Relative 9 %   Monocytes Absolute 0.8 0.1 - 1.0 K/uL   Eosinophils Relative 1 %   Eosinophils Absolute 0.1 0.0 - 0.5 K/uL    Basophils Relative 0 %   Basophils Absolute 0.0 0.0 - 0.1 K/uL   Immature Granulocytes 0 %   Abs Immature Granulocytes 0.03 0.00 - 0.07 K/uL  Basic metabolic panel     Status: Abnormal   Collection Time: 08/30/21  9:05 PM  Result Value Ref Range   Sodium 142 135 - 145 mmol/L   Potassium 4.3 3.5 - 5.1 mmol/L   Chloride 108 98 - 111 mmol/L   CO2 21 (L) 22 - 32 mmol/L   Glucose, Bld 152 (H) 70 - 99 mg/dL   BUN 33 (H) 8 - 23 mg/dL   Creatinine, Ser 1.78 (H) 0.44 - 1.00 mg/dL   Calcium 9.0 8.9 - 10.3 mg/dL   GFR, Estimated 30 (L) >60 mL/min   Anion gap 13 5 - 15  CBG monitoring, ED     Status: Abnormal   Collection Time: 08/30/21  9:46 PM  Result Value Ref Range   Glucose-Capillary 145 (H) 70 - 99 mg/dL  APTT     Status: None   Collection Time: 08/31/21  1:03 AM  Result Value Ref Range   aPTT 32 24 - 36 seconds  Protime-INR     Status: Abnormal   Collection Time: 08/31/21  1:03 AM  Result Value Ref Range   Prothrombin Time 18.1 (H) 11.4 - 15.2 seconds   INR 1.5 (H) 0.8 - 1.2  CBG monitoring, ED     Status: Abnormal   Collection Time: 08/31/21  4:40 AM  Result Value Ref Range   Glucose-Capillary 143 (H) 70 - 99 mg/dL  Lipid panel     Status: None   Collection Time: 08/31/21  5:28 AM  Result Value Ref Range   Cholesterol 159 0 - 200 mg/dL   Triglycerides 69 <150 mg/dL   HDL 64 >40 mg/dL   Total CHOL/HDL Ratio 2.5 RATIO   VLDL 14 0 - 40 mg/dL   LDL Cholesterol 81 0 - 99 mg/dL  CBC     Status: None   Collection Time: 08/31/21  5:28 AM  Result Value Ref Range   WBC 7.8 4.0 - 10.5 K/uL   RBC 4.62 3.87 - 5.11 MIL/uL   Hemoglobin 13.3 12.0 - 15.0 g/dL   HCT 42.5 36.0 - 46.0 %   MCV 92.0 80.0 - 100.0 fL   MCH 28.8 26.0 - 34.0 pg   MCHC 31.3 30.0 - 36.0 g/dL   RDW 15.1 11.5 - 15.5 %   Platelets 225 150 - 400 K/uL   nRBC 0.0 0.0 - 0.2 %  Creatinine, serum     Status: Abnormal   Collection Time: 08/31/21  5:28 AM  Result Value Ref Range   Creatinine, Ser 1.60 (H) 0.44 - 1.00  mg/dL   GFR, Estimated 34 (L) >60 mL/min  Urinalysis, Routine w reflex microscopic Urine, In & Out Cath     Status: Abnormal   Collection Time: 08/31/21  7:27 AM  Result Value Ref Range   Color, Urine YELLOW YELLOW   APPearance CLEAR CLEAR   Specific Gravity, Urine 1.020 1.005 - 1.030   pH 5.5 5.0 - 8.0   Glucose, UA >=500 (A) NEGATIVE mg/dL   Hgb urine dipstick SMALL (A) NEGATIVE   Bilirubin Urine NEGATIVE NEGATIVE   Ketones, ur NEGATIVE NEGATIVE mg/dL   Protein, ur NEGATIVE NEGATIVE mg/dL   Nitrite NEGATIVE NEGATIVE   Leukocytes,Ua SMALL (A) NEGATIVE  Urinalysis, Microscopic (reflex)     Status: Abnormal   Collection Time: 08/31/21  7:27 AM  Result Value Ref Range   RBC / HPF 0-5 0 - 5 RBC/hpf   WBC, UA 21-50 0 - 5 WBC/hpf   Bacteria, UA RARE (A) NONE SEEN   Squamous Epithelial / LPF 0-5 0 - 5  Rapid urine drug screen (hospital performed)     Status: Abnormal   Collection Time: 08/31/21  7:28 AM  Result Value Ref Range   Opiates NONE DETECTED NONE DETECTED   Cocaine NONE DETECTED NONE DETECTED   Benzodiazepines POSITIVE (A) NONE DETECTED   Amphetamines NONE DETECTED NONE DETECTED   Tetrahydrocannabinol POSITIVE (A) NONE DETECTED   Barbiturates NONE DETECTED NONE DETECTED  CBG monitoring, ED     Status: Abnormal   Collection Time: 08/31/21 11:03 AM  Result Value Ref Range   Glucose-Capillary 141 (H) 70 - 99 mg/dL   *Note: Due to a large number of results and/or encounters for the requested time period, some results have not been displayed. A complete set of results can be found in Results Review.    I have Reviewed nursing notes, Vitals, and Lab results since pt's last encounter. Pertinent lab results : see above I have ordered test including BMP, CBC, Mg I have reviewed the last note from staff over past 24 hours I have discussed pt's care plan and test results with nursing staff, case manager   LOS: 0 days   Dwyane Dee, MD [facility] 08/31/2021, 12:10 PM

## 2021-08-31 NOTE — Assessment & Plan Note (Signed)
-   Continue SSI and CBG monitoring ?

## 2021-08-31 NOTE — Assessment & Plan Note (Deleted)
-   Differential on admission includes seizure versus acute CVA -MRI brain unable to be performed until Monday after ICD can be further evaluated for compatibility - Follow-up EEG monitoring and neurology recommendations -Continue neurochecks

## 2021-08-31 NOTE — Hospital Course (Signed)
[name] is a 76 yo female with PMH afib, sCHF, vtach s/p ICD, DMII, CAD, HTN, HLD, CVA, sleep apnea who presented to the ER with increased lethargy.  She was found to be confused with twitching eye movements concerning for seizures and some mild weakness on the left side. She was admitted for stroke work-up and also underwent EEG monitoring.  EEG showed periodic discharges in the frontal region and she was converted to LTM and started on Ativan challenge.  She was also loaded with Keppra. 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  Neurology was also consulted on admission.

## 2021-08-31 NOTE — Assessment & Plan Note (Signed)
-   no s/s exacerbation - ICD in place  -Continue Entresto

## 2021-08-31 NOTE — Assessment & Plan Note (Signed)
-   Med rec completed, does not appear to be on Synthroid at home - TSH, 2.518

## 2021-08-31 NOTE — Progress Notes (Addendum)
Informed by Dr. Hortense Ramal that the stat EEG showed periodic discharges in frontal region. Will convert EEG to LTM and will give ativan challenge. Will order '2mg'$  IV for now, if not improving, will give another '2mg'$ . Will also order keppra load.   Contacted MRI department and was told that her ICD will be checked but even compatible with MRI, this will be done on Monday, not during the weekend.   Rosalin Hawking, MD PhD Stroke Neurology 08/31/2021 12:17 AM

## 2021-08-31 NOTE — Progress Notes (Signed)
Neurology Progress Note  Patient HPI: [name] is a 76 y.o. Caucasian female with PMH of CAD s/p PCI and stents, cardiomyopathy, chronic CHF, VT with ICD, AFib on eliquis, stroke, DM, HTN, HLD, CKD (III) presented to ED for lethargy and generalized weakness since 10am. Per chart, EMS reported increased weakness on the left side and pt leaning towards left, but AAOx4, slow speech but not follow commands. On [Dr. Xu] exam after code stroke activated at 9:40pm, pt only told me her name but not able to answer other questions, seem aphasic, only follows some midline commands, repeat 3-words sentences but perseverated and moving all extremities. She kept to have eyelid twitching bilaterally and difficulty open eyes. No other shaking jerking movement. CT head showed old right frontal infarct but no acute finding. CTA head and neck and perfusion is pending.   Subjective/interval - EEG with frontal periodic discharges, started on keppra - Tmax 99.8 and received tylenol - Very somnolent, but no complaints of headache, endorses being tired and wants to sleep due to fatigue. No photophobia, phonophobia or neck stiffness     Current Facility-Administered Medications:     stroke: early stages of recovery book, , Does not apply, Once, Rise Patience, MD   0.9 %  sodium chloride infusion, , Intravenous, Continuous, Rise Patience, MD, Last Rate: 75 mL/hr at 08/31/21 0131, New Bag at 08/31/21 0131   acetaminophen (TYLENOL) tablet 650 mg, 650 mg, Oral, Q4H PRN **OR** acetaminophen (TYLENOL) 160 MG/5ML solution 650 mg, 650 mg, Per Tube, Q4H PRN **OR** acetaminophen (TYLENOL) suppository 650 mg, 650 mg, Rectal, Q4H PRN, Rise Patience, MD   aspirin suppository 300 mg, 300 mg, Rectal, Daily **OR** aspirin tablet 325 mg, 325 mg, Oral, Daily, Rise Patience, MD   enoxaparin (LOVENOX) injection 30 mg, 30 mg, Subcutaneous, Q24H, Rise Patience, MD   insulin aspart (novoLOG) injection  0-9 Units, 0-9 Units, Subcutaneous, Q4H, Rise Patience, MD, 1 Units at 08/31/21 0459   labetalol (NORMODYNE) injection 10 mg, 10 mg, Intravenous, Q2H PRN, Rise Patience, MD   [COMPLETED] levETIRAcetam (KEPPRA) 2,000 mg in sodium chloride 0.9 % 250 mL IVPB, 2,000 mg, Intravenous, Once, Stopped at 08/31/21 0213 **FOLLOWED BY** levETIRAcetam (KEPPRA) IVPB 500 mg/100 mL premix, 500 mg, Intravenous, Q12H, Rise Patience, MD  Current Outpatient Medications:    acetaminophen (TYLENOL) 500 MG tablet, Take 500 mg by mouth daily as needed for moderate pain or headache., Disp: , Rfl:    amiodarone (PACERONE) 100 MG tablet, Take 1 tablet (100 mg total) by mouth daily., Disp: 90 tablet, Rfl: 3   Blood Glucose Monitoring Suppl (ACCU-CHEK NANO SMARTVIEW) W/DEVICE KIT, Use to test blood sugar 4 times daily as instructed. Dx code: E11.59, Disp: 1 kit, Rfl: 0   busPIRone (BUSPAR) 5 MG tablet, Take 1 tablet (5 mg total) by mouth 2 (two) times daily., Disp: 60 tablet, Rfl: 1   carvedilol (COREG) 12.5 MG tablet, Take 1 tablet (12.5 mg total) by mouth 2 (two) times daily with a meal., Disp: 180 tablet, Rfl: 3   cetirizine (ZYRTEC) 10 MG tablet, Take 10 mg by mouth daily., Disp: , Rfl:    Cholecalciferol (VITAMIN D3) 2000 UNITS TABS, Take 2,000 Units by mouth daily., Disp: , Rfl:    dicyclomine (BENTYL) 10 MG capsule, TAKE 1 CAPSULE (10 MG TOTAL) BY MOUTH 4 (FOUR) TIMES DAILY BEFORE MEALS AND AT BEDTIME., Disp: 120 capsule, Rfl: 1   Dulaglutide (TRULICITY) 1.5 HU/3.1SH SOPN, Inject 1.5  mg into the skin once a week., Disp: 12 mL, Rfl: 3   ELIQUIS 5 MG TABS tablet, TAKE 1 TABLET BY MOUTH TWICE A DAY (Patient taking differently: Take 5 mg by mouth 2 (two) times daily.), Disp: 60 tablet, Rfl: 11   empagliflozin (JARDIANCE) 10 MG TABS tablet, Take 10 mg by mouth daily., Disp: , Rfl:    ENTRESTO 49-51 MG, TAKE 1 TABLET BY MOUTH TWICE A DAY (Patient taking differently: Take 1 tablet by mouth 2 (two) times  daily.), Disp: 60 tablet, Rfl: 6   fluticasone (FLONASE) 50 MCG/ACT nasal spray, Place into the nose., Disp: , Rfl:    furosemide (LASIX) 20 MG tablet, Take 1 tablet (20 mg total) by mouth daily., Disp: 30 tablet, Rfl: 3   gabapentin (NEURONTIN) 100 MG capsule, Take 2 capsules (200 mg total) by mouth 2 (two) times daily., Disp: 360 capsule, Rfl: 0   glucose blood (ACCU-CHEK SMARTVIEW) test strip, Use to test blood sugar 4 times daily as instructed. Dx code E11.59, Disp: 200 each, Rfl: 11   insulin aspart (NOVOLOG FLEXPEN) 100 UNIT/ML FlexPen, Inject up to 15 units daily under skin as advised, Disp: 15 mL, Rfl: 11   insulin detemir (LEVEMIR FLEXTOUCH) 100 UNIT/ML FlexPen, Inject 20 Units into the skin daily., Disp: 15 mL, Rfl: 11   magnesium oxide (MAG-OX) 400 (240 Mg) MG tablet, TAKE 1 TABLET BY MOUTH TWICE A DAY, Disp: 180 tablet, Rfl: 1   Multiple Vitamins-Minerals (EYE VITAMINS PO), Take 1 tablet by mouth 2 (two) times daily. , Disp: , Rfl:    nitroGLYCERIN (NITROSTAT) 0.4 MG SL tablet, Place 1 tablet (0.4 mg total) under the tongue every 5 (five) minutes as needed for chest pain., Disp: 25 tablet, Rfl: 1   omeprazole (PRILOSEC) 20 MG capsule, TAKE 1 CAPSULE BY MOUTH EVERY DAY, Disp: 90 capsule, Rfl: 1   rosuvastatin (CRESTOR) 40 MG tablet, Take 1 tablet (40 mg total) by mouth daily., Disp: 100 tablet, Rfl: 3   sacubitril-valsartan (ENTRESTO) 97-103 MG, Take 1 tablet by mouth 2 (two) times daily., Disp: 60 tablet, Rfl: 3   traMADol (ULTRAM) 50 MG tablet, TAKE 1 TABLET BY MOUTH EVERY 6 HOURS AS NEEDED FOR PAIN (Patient taking differently: Take 50 mg by mouth every 6 (six) hours as needed for moderate pain.), Disp: 120 tablet, Rfl: 2   Exam: Current vital signs: BP (!) 132/93   Pulse 84   Temp 99.8 F (37.7 C) (Oral)   Resp 16   Ht _0  (1.6 m)   Wt 80.7 kg   SpO2 99%   BMI 31.53 kg/m  Vital signs in last 24 hours: Temp:  [98.2 F (36.8 C)-99.8 F (37.7 C)] 99.8 F (37.7 C) (06/10  2330) Pulse Rate:  [61-97] 84 (06/11 0645) Resp:  [12-24] 16 (06/11 0645) BP: (116-166)/(56-108) 132/93 (06/11 0645) SpO2:  [96 %-100 %] 99 % (06/11 0645) Weight:  [80.7 kg] 80.7 kg (06/11 0200)  Gen: In bed, comfortable  Resp: non-labored breathing, no grossly audible wheezing Cardiac: Perfusing extremities well  Abd: soft, nt  Neuro: MS: Very sleepy.  With repeated stimulation wakes up and briefly follows simple commands  CN: face symmetric, tongue midline, EOMI to tracking examiner, PERRL Motor: Moving all 4 extremities spontaneously and grossly equally Sensory: Grossly equally reactive to stimulation in all 4 extremities  Pertinent Labs:  Basic Metabolic Panel: Recent Labs  Lab 08/30/21 2105 08/31/21 0528  NA 142  --   K 4.3  --  CL 108  --   CO2 21*  --   GLUCOSE 152*  --   BUN 33*  --   CREATININE 1.78* 1.60*  CALCIUM 9.0  --     CBC: Recent Labs  Lab 08/30/21 2105 08/31/21 0528  WBC 9.2 7.8  NEUTROABS 6.7  --   HGB 13.6 13.3  HCT 44.2 42.5  MCV 94.6 92.0  PLT 213 225    Coagulation Studies: Recent Labs    08/31/21 0103  LABPROT 18.1*  INR 1.5*      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  However given her borderline temperature elevation will need to monitor for potential infectious etiology lowering her seizure threshold, low concern for meningitis at this time given her lack of meningitic signs and no significant leukocytosis.  However given very limited examination, cannot rule out new ischemic process, especially with uncertain Eliquis compliance at home.  Recommendations: # New onset seizures  - MRI w/ and w/o on Monday (if GFR > 30, if GFR < 30 will need w/o contrast only) - UA and CXR today for infectious workup, may need LP if not improving or spikes fever without source but will hold off for now given no fever documented yet, and not meningitic on exam - Monitor fever curve, hold tylenol for clarity  - Continue aspirin 325 mg  p.o. or 300 mg rectal for now, once MRI confirms no new stroke will transition back to home Eliquis - change outpatient tramadol to alternative opiate given tramadol lowers seizure threshold - s/p Keppra 2000 mg load, now on 500 mg BID. Will adjust as needed for renal function:  Estimated Creatinine Clearance: 31 mL/min (A) (by C-G formula based on SCr of 1.6 mg/dL (H)).   CrCl 80 to 130 mL/minute/1.73 m2: 500 mg to 1.5 g every 12 hours.  CrCl 50 to <80 mL/minute/1.73 m2: 500 mg to 1 g every 12 hours.  CrCl 30 to <50 mL/minute/1.73 m2: 250 to 750 mg every 12 hours.  CrCl 15 to <30 mL/minute/1.73 m2: 250 to 500 mg every 12 hours.  CrCl <15 mL/minute/1.73 m2: 250 to 500 mg every 24 hours (expert opinion). - LTM read pending, neurology will continue to follow  Standard seizure precautions to include in discharge instructions and review with family: Per [facility] statutes, patients with seizures are not allowed to drive until  they have been seizure-free for six months. Use caution when using heavy equipment or power tools. Avoid working on ladders or at heights. Take showers instead of baths. Ensure the water temperature is not too high on the home water heater. Do not go swimming alone. When caring for infants or small children, sit down when holding, feeding, or changing them to minimize risk of injury to the child in the event you have a seizure.  To reduce risk of seizures, maintain good sleep hygiene avoid alcohol and illicit drug use, take all anti-seizure medications as prescribed.   Lesleigh Noe MD-PhD [facility] [phone]

## 2021-08-31 NOTE — Procedures (Signed)
Patient Name: [name]  MRN: [id number]  Epilepsy Attending: Lora Havens  Referring Physician/Provider: Rosalin Hawking, MD  Date: 08/31/2021 Duration: 25.18 mins  Patient history: 76yo m with right frontal infarct now with ams. EEG to evaluate for seizure  Level of alertness: Awake  AEDs during EEG study: None  Technical aspects: This EEG study was done with scalp electrodes positioned according to the 10-20 International system of electrode placement. Electrical activity was acquired at a sampling rate of '500Hz'$  and reviewed with a high frequency filter of '70Hz'$  and a low frequency filter of '1Hz'$ . EEG data were recorded continuously and digitally stored.   Description: No posterior dominant rhythm was seen. EEG showed continuous generalized 3 to 6 Hz theta-delta slowing. Generalized and lateralized right hemisphere periodic discharges with triphasic morphology at  1.5-2 Hz were noted. Hyperventilation and photic stimulation were not performed.     ABNORMALITY - Periodic discharges with triphasic morphology, generalized and lateralized right hemisphere ( GPDs) - Continuous slow, generalized  IMPRESSION: This study showed periodic discharges at 1.5-'2Hz'$  which are on the ictal-interictal continuum. The frequency and morphology is more commonly indicative of toxic-metabolic causes. However, the predominance in right hemipshere In the setting of underlying chronic infarct could be suggestive of potential epileptogenicity. Consider ativan challenge and prolonged monitoring for further characterization.   Additionally, this study is suggestive of moderate diffuse encephalopathy, nonspecific etiology. No definite seizures were seen throughout the recording.  Rumaisa Schnetzer Barbra Sarks

## 2021-08-31 NOTE — Evaluation (Signed)
Clinical/Bedside Swallow Evaluation Patient Details  Name: [name] MRN: [id number] Date of Birth: 01/05/1946  Today's Date: 08/31/2021 Time: SLP Start Time (ACUTE ONLY): 49 SLP Stop Time (ACUTE ONLY): 3976 SLP Time Calculation (min) (ACUTE ONLY): 6 min  Past Medical History:  Past Medical History:  Diagnosis Date   Allergy    Arthritis    Atrial fibrillation ([place])    controlled with amiodarone, on coumadin   Chronic renal insufficiency    Chronic systolic heart failure (HCC)    Congestive heart failure ([place])    Coronary artery disease 01/31/2011   Diabetes mellitus    type 1   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    History of chicken pox    Hyperlipidemia    Hypertension    Ischemic cardiomyopathy    Lumbar spondylosis 01/11/2012   Sleep apnea    Stroke [facility]) 2010   eye doctor said she had TIA   Ventricular tachycardia ([place])    Polymorphic   Past Surgical History:  Past Surgical History:  Procedure Laterality Date   [place]   COLONOSCOPY WITH PROPOFOL N/A 02/24/2018   Procedure: COLONOSCOPY WITH PROPOFOL;  Surgeon: Milus Banister, MD;  Location: [facility] ENDOSCOPY;  Service: Endoscopy;  Laterality: N/A;   ICD  2003/2007   implanted by Dr Rollene Fare, most recent generator change 2/13 by Dr Rayann Heman, Analyze ST study patient   ICD GENERATOR CHANGEOUT N/A 04/11/2020   Procedure: [place];  Surgeon: Thompson Grayer, MD;  Location: [place] CV LAB;  Service: Cardiovascular;  Laterality: N/A;   IMPLANTABLE CARDIOVERTER DEFIBRILLATOR (ICD) GENERATOR CHANGE N/A 05/05/2011   Procedure: ICD GENERATOR CHANGE;  Surgeon: Thompson Grayer, MD;  Location[facility] C[facility] LAB;  Service: Cardiovascular;  Laterality: N/A;   LEFT HEART CATHETERIZATION WITH CORONARY ANGIOGRAM N/A 01/30/2011   Procedure: LEFT HEART CATHETERIZATION WITH CORONARY ANGIOGRAM;  Surgeon: Larey Dresser, MD;   Location: [facility] CATH LAB;  Service: Cardiovascular;  Laterality: N/A;   POLYPECTOMY  02/24/2018   Procedure: POLYPECTOMY;  Surgeon: Milus Banister, MD;  Location: [facility];  Service: Endoscopy;;   Leta Baptist  1980   HPI:  Ms. [name] is a 76 yo female  who presented to the ER with increased lethargy.  She was found to be confused with twitching eye movements concerning for seizures and some mild weakness on the left side.  She was admitted for stroke work-up and also underwent EEG monitoring.  EEG showed periodic discharges in the frontal region and she was converted to LTM and started on Ativan challenge.  She was also loaded with Keppra.  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 MRI pending.  CXR 6/11: "1. Pulmonary vascular congestion.  2. Small, indeterminate linear foreign body projects over the right upper lobe between the right fifth and 6 rib spaces which is of uncertain significance and may be external to the patient.  Correlation with physical exam findings advised"  Pt with PMH afib, sCHF, vtach s/p ICD, DMII, CAD, HTN, HLD, CVA, sleep apnea.    Assessment / Plan / Recommendation  Clinical Impression  Pt presents with functional swallowing as assessed clinically.  Pt tolerated all consistencies triald, including thin liquid by serial straw sips, with no clinical s/s of aspiraiton.  Pt exhibited adequate oral clearance of solids.  Pt had difficulty following directions for OME.  It is  unclear if this is 2/2 AMS v evidence of a true focal deficit. L sided weakness noted on admission.  Recommend regular texture diet with thin liquid. SLP to follow for diet tolerance; consider MBSS if pending MRI results are concerning for silent aspiration.  SLP Visit Diagnosis: Dysphagia, unspecified (R13.10)    Aspiration Risk  No limitations    Diet Recommendation Regular;Thin liquid   Liquid Administration via: Cup;Straw Medication  Administration: Whole meds with liquid (No specific precautions; Crush if needed if mentation interferes) Supervision: Staff to assist with self feeding Compensations: Slow rate;Small sips/bites Postural Changes: Seated upright at 90 degrees    Other  Recommendations Oral Care Recommendations: Oral care BID    Recommendations for follow up therapy are one component of a multi-disciplinary discharge planning process, led by the attending physician.  Recommendations may be updated based on patient status, additional functional criteria and insurance authorization.  Follow up Recommendations  (TBD)      Assistance Recommended at Discharge Intermittent Supervision/Assistance  Functional Status Assessment Patient has had a recent decline in their functional status and demonstrates the ability to make significant improvements in function in a reasonable and predictable amount of time.  Frequency and Duration min 2x/week  2 weeks       Prognosis Prognosis for Safe Diet Advancement:  (N/A)      Swallow Study   General Date of Onset: 08/30/21 HPI: Ms. [name] is a 76 yo female  who presented to the ER with increased lethargy.  She was found to be confused with twitching eye movements concerning for seizures and some mild weakness on the left side.  She was admitted for stroke work-up and also underwent EEG monitoring.  EEG showed periodic discharges in the frontal region and she was converted to LTM and started on Ativan challenge.  She was also loaded with Keppra.  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 MRI pending.  CXR 6/11: "1. Pulmonary vascular congestion.  2. Small, indeterminate linear foreign body projects over the right upper lobe between the right fifth and 6 rib spaces which is of uncertain significance and may be external to the patient.  Correlation with physical exam findings advised"  Pt with PMH afib, sCHF, vtach s/p ICD,  DMII, CAD, HTN, HLD, CVA, sleep apnea. Type of Study: Bedside Swallow Evaluation Previous Swallow Assessment: None Diet Prior to this Study: NPO Temperature Spikes Noted: No History of Recent Intubation: No Behavior/Cognition: Requires cueing Oral Cavity Assessment: Within Functional Limits Oral Care Completed by SLP: No Oral Cavity - Dentition: Adequate natural dentition Self-Feeding Abilities: Needs assist Patient Positioning: Upright in bed Baseline Vocal Quality: Normal Volitional Swallow: Able to elicit    Oral/Motor/Sensory Function Overall Oral Motor/Sensory Function: Mild impairment Facial ROM: Reduced right;Reduced left Facial Symmetry: Within Functional Limits Lingual ROM: Within Functional Limits Lingual Symmetry: Abnormal symmetry left Lingual Strength: Reduced Velum: Within Functional Limits Mandible: Within Functional Limits   Ice Chips Ice chips: Not tested   Thin Liquid Thin Liquid: Within functional limits Presentation: Cup;Straw    Nectar Thick Nectar Thick Liquid: Not tested   Honey Thick Honey Thick Liquid: Not tested   Puree Puree: Within functional limits Presentation: Spoon   Solid     Solid: Within functional limits Presentation:  (SLP fed)      Celedonio Savage, [place], [place] Office: [phone] 08/31/2021,3:05 PM

## 2021-08-31 NOTE — Assessment & Plan Note (Signed)
-   Differential on admission includes seizure versus acute CVA -MRI unable to be performed due to incompatible ICD -Repeat CT head performed on 09/01/2021, no acute findings and remote right frontal infarct noted -Patient has been continued on Keppra due to higher suspicion for seizure on admission per neurology

## 2021-08-31 NOTE — Procedures (Addendum)
Patient Name: [name]  MRN: [id number]  Epilepsy Attending: Lora Havens  Referring Physician/Provider: Rosalin Hawking, MD  Duration: 08/30/2021 2352 to 08/31/2021 2352   Patient history: 76yo m with right frontal infarct now with ams. EEG to evaluate for seizure   Level of alertness: lethargic   AEDs during EEG study: LEV   Technical aspects: This EEG study was done with scalp electrodes positioned according to the 10-20 International system of electrode placement. Electrical activity was acquired at a sampling rate of '500Hz'$  and reviewed with a high frequency filter of '70Hz'$  and a low frequency filter of '1Hz'$ . EEG data were recorded continuously and digitally stored.    Description: No posterior dominant rhythm was seen. EEG showed continuous generalized 3 to 6 Hz theta-delta slowing. Generalized and lateralized right hemisphere periodic discharges with triphasic morphology at  1.5-2 Hz were noted, more prominent when awake, stimulated. Hyperventilation and photic stimulation were not performed.     Of note, parts of study were technically difficult due to significant electrode artifact.   ABNORMALITY - Periodic discharges with triphasic morphology, generalized and lateralized right hemisphere ( GPDs) - Continuous slow, generalized   IMPRESSION: This technically difficult study showed periodic discharges at 1.5-'2Hz'$  which are on the ictal-interictal continuum. However, the frequency, morphology and reactivity to stimulation is more commonly indicative of toxic-metabolic causes.  Additionally, there is moderate diffuse encephalopathy, nonspecific etiology. No definite seizures were seen throughout the recording.     Jahmil Macleod Barbra Sarks

## 2021-09-01 ENCOUNTER — Inpatient Hospital Stay (HOSPITAL_COMMUNITY): Payer: Medicare HMO

## 2021-09-01 DIAGNOSIS — M1990 Unspecified osteoarthritis, unspecified site: Secondary | ICD-10-CM

## 2021-09-01 DIAGNOSIS — G9341 Metabolic encephalopathy: Secondary | ICD-10-CM | POA: Diagnosis not present

## 2021-09-01 LAB — GLUCOSE, CAPILLARY
Glucose-Capillary: 95 mg/dL (ref 70–99)
Glucose-Capillary: 91 mg/dL (ref 70–99)
Glucose-Capillary: 103 mg/dL — ABNORMAL HIGH (ref 70–99)
Glucose-Capillary: 156 mg/dL — ABNORMAL HIGH (ref 70–99)
Glucose-Capillary: 168 mg/dL — ABNORMAL HIGH (ref 70–99)
Glucose-Capillary: 143 mg/dL — ABNORMAL HIGH (ref 70–99)

## 2021-09-01 MED ORDER — LEVETIRACETAM 500 MG PO TABS
500.0000 mg | ORAL_TABLET | Freq: Two times a day (BID) | ORAL | Status: DC
  Administered 2021-09-01 – 2021-09-03 (×4): 500 mg via ORAL
  Filled 2021-09-01 (×4): qty 1

## 2021-09-01 MED ORDER — APIXABAN 5 MG PO TABS
5.0000 mg | ORAL_TABLET | Freq: Two times a day (BID) | ORAL | Status: DC
  Administered 2021-09-01 – 2021-09-03 (×5): 5 mg via ORAL
  Filled 2021-09-01 (×5): qty 1

## 2021-09-01 MED ORDER — SACUBITRIL-VALSARTAN 49-51 MG PO TABS
1.0000 | ORAL_TABLET | Freq: Two times a day (BID) | ORAL | Status: DC
Start: 2021-09-01 — End: 2021-09-03
  Administered 2021-09-01 – 2021-09-03 (×5): 1 via ORAL
  Filled 2021-09-01 (×6): qty 1

## 2021-09-01 MED ORDER — ENOXAPARIN SODIUM 40 MG/0.4ML IJ SOSY: 40.0000 mg | PREFILLED_SYRINGE | INTRAMUSCULAR | Status: DC

## 2021-09-01 MED ORDER — LIDOCAINE 5 % EX PTCH
1.0000 | MEDICATED_PATCH | CUTANEOUS | Status: DC
Start: 2021-09-01 — End: 2021-09-03
  Administered 2021-09-01 – 2021-09-02 (×2): 1 via TRANSDERMAL
  Filled 2021-09-01 (×2): qty 1

## 2021-09-01 MED ORDER — AMIODARONE HCL 200 MG PO TABS
100.0000 mg | ORAL_TABLET | Freq: Every day | ORAL | Status: DC
  Administered 2021-09-01 – 2021-09-03 (×3): 100 mg via ORAL
  Filled 2021-09-01 (×3): qty 1

## 2021-09-01 NOTE — Progress Notes (Signed)
PT Cancellation Note  Patient Details Name: [name] MRN: [id number] DOB: February 17, 1946   Cancelled Treatment:    Reason Eval/Treat Not Completed: Patient at procedure or test/unavailable  Patient undergoing EEG. Will see as appropriate.   [place]  Office [phone]  Rexanne Mano 09/01/2021, 9:42 AM

## 2021-09-01 NOTE — Assessment & Plan Note (Addendum)
-   Patient complained of right knee pain after admission - X-ray obtained which shows osteoarthritis involving medial compartment, no acute findings - Use ice pack, Tylenol as needed, and lidocaine patch if needed -Still ongoing pain today, will trial Toradol - If ongoing inability to work with PT, may need to consider steroid injection

## 2021-09-01 NOTE — Evaluation (Signed)
Occupational Therapy Evaluation Patient Details Name: [name] MRN: [id number] DOB: Mar 27, 1945 Today's Date: 09/01/2021   History of Present Illness 76 y.o. female presented to ED 6/10 due to lethargy, confusion, twitching movements of the eyes, mild weakness on the left side. 6/12 EEG negetive. 6/12 CT Head negative.  EHM:CNOBSJG of chronic systolic heart failure status post ICD placement, history of A-fib, diabetes mellitus type 2, hypertension, CAD, chronic kidney disease stage III   Clinical Impression   PTA patient reports living at son's home with son and grandson.She reports independence in ADLs/IADLs. Currently she is performing UB ADLs Min A and Lower ADLs, Mod to Max A. Requires Min Guard A and RW for functional mobility.  Pt presenting with decreased cognition, activity tolerance, and balance.  Pt would benefit from further acute OT to facilitate safe dc.  Pending pt progress and home support, recommend dc to SNF to optimize return to PLOF and increase safety.   Recommendations for follow up therapy are one component of a multi-disciplinary discharge planning process, led by the attending physician.  Recommendations may be updated based on patient status, additional functional criteria and insurance authorization.   Follow Up Recommendations  Skilled nursing-short term rehab (<3 hours/day)    Assistance Recommended at Discharge Frequent or constant Supervision/Assistance  Patient can return home with the following A little help with walking and/or transfers;A lot of help with bathing/dressing/bathroom;Assistance with cooking/housework;Assistance with feeding;Direct supervision/assist for medications management;Direct supervision/assist for financial management;Assist for transportation;Help with stairs or ramp for entrance    Functional Status Assessment  Patient has had a recent decline in their functional status and demonstrates the ability to make significant improvements  in function in a reasonable and predictable amount of time.  Equipment Recommendations  BSC/3in1;Tub/shower seat    Recommendations for Other Services PT consult;Speech consult     Precautions / Restrictions Precautions Precautions: Fall Precaution Comments: seizure Restrictions Weight Bearing Restrictions: No      Mobility Bed Mobility Overal bed mobility: Needs Assistance Bed Mobility: Supine to Sit     Supine to sit: Min assist, HOB elevated     General bed mobility comments: During supine to sit EOB, she lacked awareness that her feet were not in the floor. Did not follow therapist 1 step command to place feet on floor.  Required Min A to bring hips EOB.    Transfers Overall transfer level: Needs assistance Equipment used: Rolling walker (2 wheels) Transfers: Sit to/from Stand Sit to Stand: Min guard           General transfer comment: Required Min Guard A for safety.      Balance Overall balance assessment: Needs assistance Sitting-balance support: Feet supported, Single extremity supported Sitting balance-Leahy Scale: Fair     Standing balance support: During functional activity, Reliant on assistive device for balance, Single extremity supported Standing balance-Leahy Scale: Fair                             ADL either performed or assessed with clinical judgement   ADL Overall ADL's : Needs assistance/impaired Eating/Feeding: Set up;Sitting   Grooming: Wash/dry hands;Standing;Cueing for sequencing;Min guard Grooming Details (indicate cue type and reason): required verbal cues to wash bilateral hands rather than R only Upper Body Bathing: Minimal assistance;Sitting   Lower Body Bathing: Sit to/from stand;Cueing for safety;Cueing for sequencing;Moderate assistance   Upper Body Dressing : Minimal assistance;Cueing for sequencing;Sitting   Lower Body Dressing: Cueing for  safety;Cueing for sequencing;Cueing for compensatory  techniques;Maximal assistance;Sit to/from stand Lower Body Dressing Details (indicate cue type and reason): Decreased ROM.  Required Max A to don socks EOB Toilet Transfer: Min guard;Cueing for safety;Cueing for sequencing;Regular Toilet;Grab bars;Rolling walker (2 wheels)   Toileting- Clothing Manipulation and Hygiene: Min guard;Sit to/from stand       Functional mobility during ADLs: Min guard;Rolling walker (2 wheels) General ADL Comments: Pt presented with confusion that affected ADL function.  Pt requires extra time to process.     Vision Baseline Vision/History: 0 No visual deficits Patient Visual Report:  (stated she wears glasses for reading only)       Perception     Praxis      Pertinent Vitals/Pain Pain Assessment Pain Assessment: 0-10 Pain Score: 10-Worst pain ever Pain Location: right knee after mobility Pain Descriptors / Indicators: Throbbing Pain Intervention(s): Monitored during session, Repositioned, Patient requesting pain meds-RN notified, Ice applied     Hand Dominance Right   Extremity/Trunk Assessment Upper Extremity Assessment Upper Extremity Assessment: Difficult to assess due to impaired cognition;LUE deficits/detail LUE Deficits / Details: Left UE swollen and exhibited slightly weaker grip strength. decreased incorporation of LUE into hand hygiene with holding arm close to body. LUE: Unable to fully assess due to pain LUE Coordination: decreased gross motor;decreased fine motor   Lower Extremity Assessment Lower Extremity Assessment: Defer to PT evaluation       Communication Communication Communication: No difficulties   Cognition Arousal/Alertness: Awake/alert Behavior During Therapy: WFL for tasks assessed/performed Overall Cognitive Status: No family/caregiver present to determine baseline cognitive functioning Area of Impairment: Orientation, Attention, Memory, Following commands, Safety/judgement, Awareness, Problem solving                  Orientation Level: Situation, Disoriented to, Time ("This is 2013." Did not offer, but when asked the month, she stated "June.") Current Attention Level: Sustained Memory: Decreased recall of precautions, Decreased short-term memory Following Commands: Follows one step commands with increased time Safety/Judgement: Decreased awareness of safety, Decreased awareness of deficits Awareness: Intellectual Problem Solving: Slow processing, Decreased initiation, Requires verbal cues General Comments: Pt required significant time through all ADLs, poor awareness when she started to fatigue, and tangential during conversation.     General Comments  Patient reports she will have assistance at home from her 60 yo grand son and that her son works during the day.  Patient with confusion, not sure if report is correct.  VSS throughout therapy session.    Exercises     Shoulder Instructions      Home Living Family/patient expects to be discharged to:: Private residence Living Arrangements: Children Available Help at Discharge: Other (Comment) (unsure) Type of Home: House Home Access: Stairs to enter [employer] of Steps: 1 Entrance Stairs-Rails: None Home Layout: One level     Bathroom Shower/Tub: Occupational psychologist: Standard     Home Equipment: Rollator (4 wheels)   Additional Comments: Report per patient.  She presents with cognitive deficits.      Prior Functioning/Environment Prior Level of Function : Patient poor historian/Family not available;Other (comment)             Mobility Comments: functional mobility with a rollator when her R knee hurt. ADLs Comments: Patient reports she completed all ADLs,  medication mgmt.        OT Problem List: Decreased strength;Decreased range of motion;Decreased activity tolerance;Impaired balance (sitting and/or standing);Decreased coordination;Decreased cognition;Decreased safety awareness;Decreased  knowledge of precautions;Pain;Increased  edema (Edema noted in R hand)      OT Treatment/Interventions: Self-care/ADL training;Neuromuscular education;Therapeutic activities;Cognitive remediation/compensation;Patient/family education;Balance training    OT Goals(Current goals can be found in the care plan section) Acute Rehab OT Goals Patient Stated Goal: to go home OT Goal Formulation: With patient Time For Goal Achievement: 09/15/21 Potential to Achieve Goals: Good  OT Frequency: Min 2X/week    Co-evaluation              AM-PAC OT "6 Clicks" Daily Activity     Outcome Measure Help from another person eating meals?: A Little Help from another person taking care of personal grooming?: A Little Help from another person toileting, which includes using toliet, bedpan, or urinal?: A Little Help from another person bathing (including washing, rinsing, drying)?: A Lot Help from another person to put on and taking off regular upper body clothing?: A Little Help from another person to put on and taking off regular lower body clothing?: A Lot 6 Click Score: 16   End of Session Equipment Utilized During Treatment: Gait belt;Rolling walker (2 wheels) Nurse Communication: Mobility status;Patient requests pain meds  Activity Tolerance: Patient limited by fatigue Patient left: in chair;with call bell/phone within reach;with chair alarm set (ice pack applied to painful, throbbing R knee)  OT Visit Diagnosis: Unsteadiness on feet (R26.81);Other abnormalities of gait and mobility (R26.89);Muscle weakness (generalized) (M62.81);Other symptoms and signs involving cognitive function;Other symptoms and signs involving the nervous system (R29.898);Cognitive communication deficit (R41.841);Pain Pain - Right/Left: Right Pain - part of body: Knee                Time: 0938-1829 OT Time Calculation (min): 57 min Charges:  OT General Charges $OT Visit: 1 Visit OT Evaluation $OT Eval Moderate  Complexity: 1 Mod OT Treatments $Self Care/Home Management : 8-22 mins  Herschell Dimes, OTS 09/01/2021, 6:03 PM

## 2021-09-01 NOTE — Procedures (Signed)
Patient Name: ANAIAH MCMANNIS  MRN: [id number]  Epilepsy Attending: Lora Havens  Referring Physician/Provider: Rosalin Hawking, MD  Duration: 08/31/2021 2352 to 09/01/2021 1111   Patient history: 76yo m with right frontal infarct now with ams. EEG to evaluate for seizure   Level of alertness: Awake, asleep   AEDs during EEG study: LEV   Technical aspects: This EEG study was done with scalp electrodes positioned according to the 10-20 International system of electrode placement. Electrical activity was acquired at a sampling rate of '500Hz'$  and reviewed with a high frequency filter of '70Hz'$  and a low frequency filter of '1Hz'$ . EEG data were recorded continuously and digitally stored.    Description: No posterior dominant rhythm was seen.  Sleep was characterized by sleep (12 to 14 Hz), maximal frontocentral region.  EEG showed continuous generalized 3 to 6 Hz theta-delta slowing, at times with triphasic morphology. Hyperventilation and photic stimulation were not performed.      Of note, parts of study were technically difficult due to significant electrode artifact.   ABNORMALITY - Continuous slow, generalized   IMPRESSION: This technically difficult study is suggestive of moderate diffuse encephalopathy, nonspecific etiology but could be secondary to toxic-metabolic causes. No definite seizures were seen throughout the recording.   Jerri Glauser Barbra Sarks

## 2021-09-01 NOTE — Progress Notes (Addendum)
Progress Note    [name]   MLU:[id number]  DOB: 01-13-1946  DOA: 08/30/2021     1 PCP: Pcp, No  Initial CC: lethargy, abnormal eye movement, left side weakness  Hospital Course: [name] is a 76 yo female with PMH afib, sCHF, vtach s/p ICD, DMII, CAD, HTN, HLD, CVA, sleep apnea who presented to the ER with increased lethargy.  She was found to be confused with twitching eye movements concerning for seizures and some mild weakness on the left side. She was admitted for stroke work-up and also underwent EEG monitoring.  EEG showed periodic discharges in the frontal region and she was converted to LTM and started on Ativan challenge.  She was also loaded with Keppra. 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  Neurology was also consulted on admission.  Interval History:  Much more awake and alert this morning.  Able to carry on conversation, answer questions, and follow commands easily.  Assessment and Plan: * Acute metabolic encephalopathy - Differential on admission includes seizure versus acute CVA -MRI unable to be performed due to incompatible ICD -Repeat CT head performed on 09/01/2021, no acute findings and remote right frontal infarct noted  Hypothyroidism - Med rec completed, does not appear to be on Synthroid at home - TSH, 2.518  Osteoarthritis - Patient complained of right knee pain after admission - X-ray obtained which shows osteoarthritis involving medial compartment, no acute findings - Use ice pack, Tylenol as needed, and lidocaine patch if needed  PAF (paroxysmal atrial fibrillation) (HCC) - Eliquis held on admission - Repeat CT head performed today and reviewed by neurology - Okay to resume Eliquis today  Chronic kidney disease, stage 3b (HCC) - patient has history of CKD3b. Baseline creat ~ 1.4 - 1.6, eGFR 30-35 - currently at baseline    DM (diabetes mellitus), type 2 with renal  complications (HCC) - Continue SSI and CBG monitoring  Chronic systolic CHF (congestive heart failure) (HCC) - no s/s exacerbation - ICD in place  -Continue Entresto  HTN (hypertension) - entresto resumed    Old records reviewed in assessment of this patient  Antimicrobials:   DVT prophylaxis:   apixaban (ELIQUIS) tablet 5 mg   Code Status:   Code Status: Full Code  Disposition Plan:  pending PT eval Status is: Inpt  Objective: Blood pressure (!) 92/55, pulse 61, temperature 97.6 F (36.4 C), temperature source Oral, resp. rate 14, height 5\' 3"  (1.6 m), weight 80.7 kg, SpO2 97 %.  Examination:  Physical Exam Constitutional:      Comments: More awake, alert, able to answer questions easily and follow commands  HENT:     Head: Normocephalic and atraumatic.     Mouth/Throat:     Mouth: Mucous membranes are moist.  Eyes:     Pupils: Pupils are equal, round, and reactive to light.  Cardiovascular:     Rate and Rhythm: Normal rate and regular rhythm.  Pulmonary:     Effort: Pulmonary effort is normal.     Breath sounds: Normal breath sounds.  Abdominal:     General: Bowel sounds are normal. There is no distension.     Palpations: Abdomen is soft.  Musculoskeletal:        General: No swelling.     Cervical back: No rigidity.  Skin:    General: Skin is warm and dry.  Neurological:     General: No focal deficit present.  Consultants:  Neurology  Procedures:    Data Reviewed: Results for orders placed or performed during the hospital encounter of 08/30/21 (from the past 24 hour(s))  Glucose, capillary     Status: None   Collection Time: 08/31/21  6:34 PM  Result Value Ref Range   Glucose-Capillary 96 70 - 99 mg/dL  Glucose, capillary     Status: None   Collection Time: 08/31/21  7:50 PM  Result Value Ref Range   Glucose-Capillary 90 70 - 99 mg/dL  Glucose, capillary     Status: None   Collection Time: 08/31/21 10:01 PM  Result Value Ref Range    Glucose-Capillary 98 70 - 99 mg/dL  Glucose, capillary     Status: Abnormal   Collection Time: 08/31/21 11:20 PM  Result Value Ref Range   Glucose-Capillary 104 (H) 70 - 99 mg/dL  Glucose, capillary     Status: None   Collection Time: 09/01/21  3:24 AM  Result Value Ref Range   Glucose-Capillary 95 70 - 99 mg/dL  Glucose, capillary     Status: None   Collection Time: 09/01/21  3:44 AM  Result Value Ref Range   Glucose-Capillary 91 70 - 99 mg/dL  Glucose, capillary     Status: Abnormal   Collection Time: 09/01/21  8:05 AM  Result Value Ref Range   Glucose-Capillary 103 (H) 70 - 99 mg/dL  Glucose, capillary     Status: Abnormal   Collection Time: 09/01/21 12:26 PM  Result Value Ref Range   Glucose-Capillary 156 (H) 70 - 99 mg/dL  Glucose, capillary     Status: Abnormal   Collection Time: 09/01/21  4:22 PM  Result Value Ref Range   Glucose-Capillary 168 (H) 70 - 99 mg/dL   *Note: Due to a large number of results and/or encounters for the requested time period, some results have not been displayed. A complete set of results can be found in Results Review.    I have Reviewed nursing notes, Vitals, and Lab results since pt's last encounter. Pertinent lab results : see above I have ordered test including BMP, CBC, Mg I have reviewed the last note from staff over past 24 hours I have discussed pt's care plan and test results with nursing staff, case manager   LOS: 1 day   Dwyane Dee, MD [facility] 09/01/2021, 4:27 PM

## 2021-09-01 NOTE — Progress Notes (Signed)
EEG D/C'd. Patient had no skin break down. [facility] notified.  

## 2021-09-01 NOTE — Progress Notes (Signed)
Subjective: No acute events overnight.  Patient states she is feeling pretty close to baseline.  States she is able to independently perform all her ADLs at home.  Denies any concerns.  ROS: negative except above Examination  Vital signs in last 24 hours: Temp:  [96.3 F (35.7 C)-99 F (37.2 C)] 98.5 F (36.9 C) (06/12 0801) Pulse Rate:  [55-88] 74 (06/12 0801) Resp:  [11-20] 12 (06/12 0801) BP: (113-172)/(48-81) 148/48 (06/12 0801) SpO2:  [96 %-100 %] 96 % (06/12 0801)  General: lying in bed, NAD Neuro: AOx3, cranial nerves II to XII grossly intact, 5/5 in all extremities, FTN intact bilaterally   Basic Metabolic Panel: Recent Labs  Lab 08/30/21 2105 08/31/21 0528  NA 142  --   K 4.3  --   CL 108  --   CO2 21*  --   GLUCOSE 152*  --   BUN 33*  --   CREATININE 1.78* 1.60*  CALCIUM 9.0  --     CBC: Recent Labs  Lab 08/30/21 2105 08/31/21 0528  WBC 9.2 7.8  NEUTROABS 6.7  --   HGB 13.6 13.3  HCT 44.2 42.5  MCV 94.6 92.0  PLT 213 225     Coagulation Studies: Recent Labs    08/31/21 0103  LABPROT 18.1*  INR 1.5*    Imaging   CT head without contrast 08/30/2021: Old right frontal infarct, stable. No acute intracranial abnormality.  CT angio head and neck with contrast 08/30/2021: No emergent large vessel occlusion or high-grade stenosis of the intracranial or cervical arteries. Aortic Atherosclerosis   ASSESSMENT AND PLAN: 83 old female with chronic right frontal infarct who presented with altered mental status.  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  Will switch to p.o.  -Of note, this was started due to periodic discharges with triphasic morphology which were more prominent in the right hemisphere.  If patient remains seizure-free, or develops any side effect, this can potentially be weaned off -Discontinue LTM EEG  as no seizures overnight -Unable to obtain MRI as ICD is incompatible.  Will order repeat CT head without contrast. -If no stroke on CT head, please resume Eliquis at home dose -Continue seizure precautions -Follow-up with neurologyirguis  Seizure precautions: Per [facility] statutes, patients with seizures are not allowed to drive until they have been seizure-free for six months and cleared by a physician    Use caution when using heavy equipment or power tools. Avoid working on ladders or at heights. Take showers instead of baths. Ensure the water temperature is not too high on the home water heater. Do not go swimming alone. Do not lock yourself in a room alone (i.e. bathroom). When caring for infants or small children, sit down when holding, feeding, or changing them to minimize risk of injury to the child in the event you have a seizure. Maintain good sleep hygiene. Avoid alcohol.    If patient has another seizure, call 911 and bring them back to the ED if: A.  The seizure lasts longer than 5 minutes.      B.  The patient doesn't wake shortly after the seizure or has new problems such as difficulty seeing, speaking or moving following the seizure C.  The patient was injured during the seizure D.  The patient has a temperature over 102 F (39C) E.  The patient vomited during the seizure  and now is having trouble breathing    During the Seizure   - First, ensure adequate ventilation and place patients on the floor on their left side  Loosen clothing around the neck and ensure the airway is patent. If the patient is clenching the teeth, do not force the mouth open with any object as this can cause severe damage - Remove all items from the surrounding that can be hazardous. The patient may be oblivious to what's happening and may not even know what he or she is doing. If the patient is confused and wandering, either gently guide him/her away and block access to outside areas -  Reassure the individual and be comforting - Call 911. In most cases, the seizure ends before EMS arrives. However, there are cases when seizures may last over 3 to 5 minutes. Or the individual may have developed breathing difficulties or severe injuries. If a pregnant patient or a person with diabetes develops a seizure, it is prudent to call an ambulance. - Finally, if the patient does not regain full consciousness, then call EMS. Most patients will remain confused for about 45 to 90 minutes after a seizure, so you must use judgment in calling for help.    After the Seizure (Postictal Stage)   After a seizure, most patients experience confusion, fatigue, muscle pain and/or a headache. Thus, one should permit the individual to sleep. For the next few days, reassurance is essential. Being calm and helping reorient the person is also of importance.   Most seizures are painless and end spontaneously. Seizures are not harmful to others but can lead to complications such as stress on the lungs, brain and the heart. Individuals with prior lung problems may develop labored breathing and respiratory distress.    I have spent a total of   36 minutes with the patient reviewing hospital notes,  test results, labs and examining the patient as well as establishing an assessment and plan that was discussed personally with the patient.  > 50% of time was spent in direct patient care.   Zeb Comfort Epilepsy [facility] For questions after 5pm please refer to AMION to reach the Neurologist on call

## 2021-09-01 NOTE — Progress Notes (Signed)
  Transition of Care [facility]) Screening Note   Patient Details  Name: [name] Date of Birth: 29-Apr-1945   Transition of Care [facility]) CM/SW Contact:    Pollie Friar, RN Phone Number: 09/01/2021, 2:46 PM   Pt is from home. Awaiting PT/OT evals to assist with d/c needs.  Transition of Care Department [facility]) has reviewed patient. We will continue to monitor patient advancement through interdisciplinary progression rounds. If new patient transition needs arise, please place a TOC consult.

## 2021-09-01 NOTE — Assessment & Plan Note (Signed)
-   Eliquis held on admission - Repeat CT head performed today and reviewed by neurology - Okay to resume Eliquis today

## 2021-09-02 DIAGNOSIS — G9341 Metabolic encephalopathy: Secondary | ICD-10-CM | POA: Diagnosis not present

## 2021-09-02 LAB — GLUCOSE, CAPILLARY
Glucose-Capillary: 114 mg/dL — ABNORMAL HIGH (ref 70–99)
Glucose-Capillary: 117 mg/dL — ABNORMAL HIGH (ref 70–99)
Glucose-Capillary: 120 mg/dL — ABNORMAL HIGH (ref 70–99)
Glucose-Capillary: 122 mg/dL — ABNORMAL HIGH (ref 70–99)
Glucose-Capillary: 149 mg/dL — ABNORMAL HIGH (ref 70–99)
Glucose-Capillary: 168 mg/dL — ABNORMAL HIGH (ref 70–99)

## 2021-09-02 MED ORDER — MORPHINE SULFATE (PF) 2 MG/ML IV SOLN: 2.0000 mg | INTRAVENOUS | Status: DC | PRN

## 2021-09-02 MED ORDER — KETOROLAC TROMETHAMINE 15 MG/ML IJ SOLN
15.0000 mg | Freq: Three times a day (TID) | INTRAMUSCULAR | Status: DC
  Administered 2021-09-02 – 2021-09-03 (×3): 15 mg via INTRAVENOUS
  Filled 2021-09-02 (×3): qty 1

## 2021-09-02 MED ORDER — MORPHINE SULFATE (PF) 2 MG/ML IV SOLN
2.0000 mg | INTRAVENOUS | Status: DC | PRN
  Administered 2021-09-02: 2 mg via INTRAVENOUS
  Filled 2021-09-02: qty 1

## 2021-09-02 MED ORDER — OXYCODONE HCL 5 MG PO TABS
5.0000 mg | ORAL_TABLET | ORAL | Status: DC | PRN
  Administered 2021-09-02 – 2021-09-03 (×3): 5 mg via ORAL
  Filled 2021-09-02 (×3): qty 1

## 2021-09-02 MED ORDER — DICLOFENAC SODIUM 1 % EX GEL
4.0000 g | Freq: Four times a day (QID) | CUTANEOUS | Status: DC
Start: 2021-09-02 — End: 2021-09-03
  Administered 2021-09-02 (×3): 4 g via TOPICAL
  Filled 2021-09-02: qty 100

## 2021-09-02 MED ORDER — KETOROLAC TROMETHAMINE 15 MG/ML IJ SOLN
15.0000 mg | Freq: Once | INTRAMUSCULAR | Status: AC
  Administered 2021-09-02: 15 mg via INTRAVENOUS

## 2021-09-02 NOTE — Progress Notes (Signed)
Speech Language Pathology Treatment: Dysphagia  Patient Details Name: [name] MRN: [id number] DOB: 1945/07/13 Today's Date: 09/02/2021 Time: 1100-1116 SLP Time Calculation (min) (ACUTE ONLY): 16 min  Assessment / Plan / Recommendation Clinical Impression  Pt denies any difficulties with her meals so far. She consumed some regular solids and thin liquids with no overt s/s of aspiration or dysphagia, although did need some redirection to start PO intake in the setting of increased distractibility due to her pain. No cueing needed for safety though. Would continue with current diet. SLP to continue to follow for cognition only.   HPI HPI: [name] is a 76 yo female  who presented to the ER with increased lethargy.  She was found to be confused with twitching eye movements concerning for seizures and some mild weakness on the left side.  She was admitted for stroke work-up and also underwent EEG monitoring.  EEG showed periodic discharges in the frontal region and she was converted to LTM and started on Ativan challenge.  She was also loaded with Keppra.  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  CXR 6/11: "1. Pulmonary vascular congestion.  2. Small, indeterminate linear foreign body projects over the right upper lobe between the right fifth and 6 rib spaces which is of uncertain significance and may be external to the patient.  Correlation with physical exam findings advised"  Pt with PMH afib, sCHF, vtach s/p ICD, DMII, CAD, HTN, HLD, CVA, sleep apnea.      SLP Plan  All goals met  Patient needs continued Speech Lanaguage Pathology Services   Recommendations for follow up therapy are one component of a multi-disciplinary discharge planning process, led by the attending physician.  Recommendations may be updated based on patient status, additional functional criteria and insurance authorization.    Recommendations  Diet  recommendations: Regular;Thin liquid Liquids provided via: Straw;Cup Medication Administration: Whole meds with liquid Supervision: Patient able to self feed;Intermittent supervision to cue for compensatory strategies Compensations: Slow rate;Small sips/bites Postural Changes and/or Swallow Maneuvers: Seated upright 90 degrees                Follow Up Recommendations: Home health SLP (for cognition only) Assistance recommended at discharge: Frequent or constant Supervision/Assistance SLP Visit Diagnosis: Dysphagia, unspecified (R13.10) Plan: All goals met           Osie Bond., M.A. [place] Office [phone]  Secure chat preferred   09/02/2021, 11:37 AM

## 2021-09-02 NOTE — Progress Notes (Signed)
Progress Note    KATILYNN SINKLER   LDJ:[id number]  DOB: August 25, 1945  DOA: 08/30/2021     2 PCP: Pcp, No  Initial CC: lethargy, abnormal eye movement, left side weakness  Hospital Course: [name] is a 76 yo female with PMH afib, sCHF, vtach s/p ICD, DMII, CAD, HTN, HLD, CVA, sleep apnea who presented to the ER with increased lethargy.  She was found to be confused with twitching eye movements concerning for seizures and some mild weakness on the left side. She was admitted for stroke work-up and also underwent EEG monitoring.  EEG showed periodic discharges in the frontal region and she was converted to LTM and started on Ativan challenge.  She was also loaded with Keppra. 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  Neurology was also consulted on admission.  Interval History:  Remains awake and alert.  Biggest complaint appears to be her ongoing right knee pain.  Reviewed results of x-ray with her showing osteoarthritis.  Continuing to try other treatments for pain relief today.  Assessment and Plan: * Acute metabolic encephalopathy-resolved as of 09/02/2021 - Differential on admission includes seizure versus acute CVA -MRI unable to be performed due to incompatible ICD -Repeat CT head performed on 09/01/2021, no acute findings and remote right frontal infarct noted -Patient has been continued on Keppra due to higher suspicion for seizure on admission per neurology  Hypothyroidism - Med rec completed, does not appear to be on Synthroid at home - TSH, 2.518  Osteoarthritis - Patient complained of right knee pain after admission - X-ray obtained which shows osteoarthritis involving medial compartment, no acute findings - Use ice pack, Tylenol as needed, and lidocaine patch if needed -Still ongoing pain today, will trial Toradol - If ongoing inability to work with PT, may need to consider steroid injection  PAF (paroxysmal  atrial fibrillation) ([place]) - Eliquis held on admission - Repeat CT head performed today and reviewed by neurology - Okay to resume Eliquis today  Chronic kidney disease, stage 3b ([place]) - patient has history of CKD3b. Baseline creat ~ 1.4 - 1.6, eGFR 30-35 - currently at baseline    DM (diabetes mellitus), type 2 with renal complications (HCC) - Continue SSI and CBG monitoring  Chronic systolic CHF (congestive heart failure) (HCC) - no s/s exacerbation - ICD in place  -Continue Entresto  HTN (hypertension) - entresto resumed    Old records reviewed in assessment of this patient  Antimicrobials:   DVT prophylaxis:   apixaban (ELIQUIS) tablet 5 mg   Code Status:   Code Status: Full Code  Disposition Plan:  pending PT eval Status is: Inpt  Objective: Blood pressure (!) 155/61, pulse 89, temperature 98.5 F (36.9 C), temperature source Oral, resp. rate 19, height $RemoveBe'5\' 3"'kFklFPfil$  (1.6 m), weight 80.7 kg, SpO2 98 %.  Examination:  Physical Exam Constitutional:      Comments: Remains awake, alert, and cooperative/pleasant.  No further confusion  HENT:     Head: Normocephalic and atraumatic.     Mouth/Throat:     Mouth: Mucous membranes are moist.  Eyes:     Pupils: Pupils are equal, round, and reactive to light.  Cardiovascular:     Rate and Rhythm: Normal rate and regular rhythm.  Pulmonary:     Effort: Pulmonary effort is normal.     Breath sounds: Normal breath sounds.  Abdominal:     General: Bowel sounds are normal. There is no  distension.     Palpations: Abdomen is soft.  Musculoskeletal:        General: No swelling.     Cervical back: No rigidity.     Comments: Tenderness to palpation with right knee.  No overt swelling, erythema, calor appreciated  Skin:    General: Skin is warm and dry.  Neurological:     General: No focal deficit present.  Psychiatric:        Mood and Affect: Mood normal.      Consultants:  Neurology  Procedures:    Data  Reviewed: Results for orders placed or performed during the hospital encounter of 08/30/21 (from the past 24 hour(s))  Glucose, capillary     Status: Abnormal   Collection Time: 09/01/21  4:22 PM  Result Value Ref Range   Glucose-Capillary 168 (H) 70 - 99 mg/dL  Glucose, capillary     Status: Abnormal   Collection Time: 09/01/21  8:53 PM  Result Value Ref Range   Glucose-Capillary 143 (H) 70 - 99 mg/dL  Glucose, capillary     Status: Abnormal   Collection Time: 09/02/21 12:36 AM  Result Value Ref Range   Glucose-Capillary 117 (H) 70 - 99 mg/dL  Glucose, capillary     Status: Abnormal   Collection Time: 09/02/21  4:58 AM  Result Value Ref Range   Glucose-Capillary 122 (H) 70 - 99 mg/dL  Glucose, capillary     Status: Abnormal   Collection Time: 09/02/21  8:04 AM  Result Value Ref Range   Glucose-Capillary 120 (H) 70 - 99 mg/dL  Glucose, capillary     Status: Abnormal   Collection Time: 09/02/21 12:03 PM  Result Value Ref Range   Glucose-Capillary 149 (H) 70 - 99 mg/dL   *Note: Due to a large number of results and/or encounters for the requested time period, some results have not been displayed. A complete set of results can be found in Results Review.    I have Reviewed nursing notes, Vitals, and Lab results since pt's last encounter. Pertinent lab results : see above I have ordered test including BMP, CBC, Mg I have reviewed the last note from staff over past 24 hours I have discussed pt's care plan and test results with nursing staff, case manager   LOS: 2 days   Dwyane Dee, MD [facility] 09/02/2021, 1:21 PM

## 2021-09-02 NOTE — Evaluation (Signed)
Speech Language Pathology Evaluation Patient Details Name: [name] MRN: [id number] DOB: 06-06-1945 Today's Date: 09/02/2021 Time: 1047-1100 SLP Time Calculation (min) (ACUTE ONLY): 13 min  Problem List:  Patient Active Problem List   Diagnosis Date Noted   Osteoarthritis 09/01/2021   DM (diabetes mellitus), type 2 with renal complications ([place]) 25/85/2778   Acute metabolic encephalopathy 24/23/5361   Chronic kidney disease, stage 3b ([place]) 08/31/2021   Encounter for well adult exam with abnormal findings 07/30/2018   Polyp of ascending colon    Hypothyroidism 08/02/2017   Personal history of ventricular tachycardia 08/02/2017   Diabetic peripheral neuropathy associated with type 2 diabetes mellitus ([place]) 07/02/2017   Hyperlipidemia 07/02/2017   Hypotension 05/06/2015   Colon cancer screening 12/21/2014   Acute sinus infection 06/06/2014   Allergic rhinitis 06/06/2014   Arthritis of knee, left 04/30/2014   Knee MCL sprain 03/28/2014   Pes anserine bursitis 02/23/2014   Spinal stenosis of lumbar region 02/23/2014   Lower back pain 01/23/2014   Bilateral knee pain 01/23/2014   Left lateral abdominal pain 01/23/2014   UTI (urinary tract infection) 08/03/2013   Encounter for therapeutic drug monitoring 44/31/5400   Chronic systolic CHF (congestive heart failure) ([place]) 03/20/2013   CKD (chronic kidney disease) 03/20/2013   HTN (hypertension) 12/15/2012   Obesity, unspecified 12/15/2012   Type 2 diabetes, uncontrolled, with circulatory disorder 12/15/2012   Left knee pain 08/30/2012   Lumbar spondylosis 01/11/2012   Coronary artery disease 01/31/2011   Implantable cardioverter-defibrillator (ICD) in situ 01/27/2011     ventricular tachycardia-polymorphic 01/27/2011   Ischemic cardiomyopathy 01/27/2011   PAF (paroxysmal atrial fibrillation) ([place]) 01/27/2011   Past Medical History:  Past Medical History:  Diagnosis Date   Allergy    Arthritis    Atrial fibrillation  ([place])    controlled with amiodarone, on coumadin   Chronic renal insufficiency    Chronic systolic heart failure (HCC)    Congestive heart failure ([place])    Coronary artery disease 01/31/2011   Diabetes mellitus    type 1   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    History of chicken pox    Hyperlipidemia    Hypertension    Ischemic cardiomyopathy    Lumbar spondylosis 01/11/2012   Sleep apnea    Stroke [facility]) 2010   eye doctor said she had TIA   Ventricular tachycardia ([place])    Polymorphic   Past Surgical History:  Past Surgical History:  Procedure Laterality Date   [place]   COLONOSCOPY WITH PROPOFOL N/A 02/24/2018   Procedure: COLONOSCOPY WITH PROPOFOL;  Surgeon: Milus Banister, MD;  Location: [facility] ENDOSCOPY;  Service: Endoscopy;  Laterality: N/A;   ICD  2003/2007   implanted by Dr Rollene Fare, most recent generator change 2/13 by Dr Rayann Heman, Analyze ST study patient   ICD GENERATOR CHANGEOUT N/A 04/11/2020   Procedure: [place];  Surgeon: Thompson Grayer, MD;  Location: [facility] CV LAB;  Service: Cardiovascular;  Laterality: N/A;   IMPLANTABLE CARDIOVERTER DEFIBRILLATOR (ICD) GENERATOR CHANGE N/A 05/05/2011   Procedure: ICD GENERATOR CHANGE;  Surgeon: Thompson Grayer, MD;  Location: [facility] CATH LAB;  Service: Cardiovascular;  Laterality: N/A;   LEFT HEART CATHETERIZATION WITH CORONARY ANGIOGRAM N/A 01/30/2011   Procedure: LEFT HEART CATHETERIZATION WITH CORONARY ANGIOGRAM;  Surgeon: Larey Dresser, MD;  Location: [facility] CATH LAB;  Service: Cardiovascular;  Laterality: N/A;   POLYPECTOMY  02/24/2018  Procedure: POLYPECTOMY;  Surgeon: Milus Banister, MD;  Location: [facility] ENDOSCOPY;  Service: Endoscopy;;   [place] Star[place]   HPI:  Ms. Tweten is a 76 yo female  who presented to the ER with increased lethargy.  She was found to be confused with twitching eye movements concerning for  seizures and some mild weakness on the left side.  She was admitted for stroke work-up and also underwent EEG monitoring.  EEG showed periodic discharges in the frontal region and she was converted to LTM and started on Ativan challenge.  She was also loaded with Keppra.  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  CXR 6/11: "1. Pulmonary vascular congestion.  2. Small, indeterminate linear foreign body projects over the right upper lobe between the right fifth and 6 rib spaces which is of uncertain significance and may be external to the patient.  Correlation with physical exam findings advised"  Pt with PMH afib, sCHF, vtach s/p ICD, DMII, CAD, HTN, HLD, CVA, sleep apnea.   Assessment / Plan / Recommendation Clinical Impression  Pt and her son report improvements from admission in terms of her mentation, but they both believe that she is still not back to her cognitive baseline. Attempted to administer the SLUMS, although only completed the first 7 (out of 11) subtests due to high levels of distractibility from what pt reported to be sciatice nerve pain (RN made aware). She reported feeling like she "couldn't concentrate." Attempted to make her more comfortable to try to help her feel better and focus, but she did not think repositioning would help. Pt preferred to finish testing another time, but did score 6 out of a possible 14 points on what was administered. Decreased sustained attention appeared to have a significant impact on storage and retrieval for delayed recall as well as verbal problem solving and divergent naming. Pt will benefit from ongoing SLP f/u.    SLP Assessment  SLP Recommendation/Assessment: Patient needs continued Speech [place] Pathology Services SLP Visit Diagnosis: Cognitive communication deficit (R41.841)    Recommendations for follow up therapy are one component of a multi-disciplinary discharge planning process, led by  the attending physician.  Recommendations may be updated based on patient status, additional functional criteria and insurance authorization.    Follow Up Recommendations  Home health SLP    Assistance Recommended at Discharge  Frequent or constant Supervision/Assistance  Functional Status Assessment Patient has had a recent decline in their functional status and demonstrates the ability to make significant improvements in function in a reasonable and predictable amount of time.  Frequency and Duration min 2x/week  2 weeks      SLP Evaluation Cognition  Overall Cognitive Status: Impaired/Different from baseline Arousal/Alertness: Awake/alert Orientation Level: Oriented X4 Attention: Sustained Sustained Attention: Impaired Sustained Attention Impairment: Verbal basic Memory: Impaired Memory Impairment: Decreased recall of new information;Retrieval deficit;Storage deficit Awareness: Appears intact Problem Solving: Impaired Problem Solving Impairment: Verbal complex Safety/Judgment: Appears intact       Comprehension  Auditory Comprehension Overall Auditory Comprehension: Impaired Conversation: Simple Other Conversation Comments: needed repetition of questions and intructions    Expression Expression Primary Mode of Expression: Verbal Verbal Expression Overall Verbal Expression: Impaired (divergent naming: 10 animals in one minute) Written Expression Dominant Hand: Right   Oral / Motor  Motor Speech Overall Motor Speech: Appears within functional limits for tasks assessed            Osie Bond., M.A. CCC-SLP  Acute Rehabilitation Services Office [phone]  Secure chat preferred  09/02/2021, 11:36 AM

## 2021-09-02 NOTE — TOC Initial Note (Addendum)
Transition of Care [facility]) - Initial/Assessment Note    Patient Details  Name: [name] MRN: [id number] Date of Birth: 1945-05-29  [facility] of [facility]) CM/SW Contact:    Carles Collet, RN Phone Number: 09/02/2021, 2:10 PM  Clinical Narrative:       Patient admitted with encephalopathy. Attempt to reach son to assess home living support and choices for DC assistance HH vs SNF unsuccessful. TOC will continue to follow       Bevin, Das (Son)  [phone]        Expected Discharge Plan:  (TBD) Barriers to Discharge: Continued Medical Work up   Patient Goals and CMS Choice        Expected Discharge Plan and Services Expected Discharge Plan:  (TBD)   Discharge Planning Services: CM Consult   Living arrangements for the past 2 months: Single Family Home                                      Prior Living Arrangements/Services Living arrangements for the past 2 months: Single Family Home Lives with:: Adult Children                   Activities of Daily Living      Permission Sought/Granted                  Emotional Assessment              Admission diagnosis:  Weakness [R53.1] Acute encephalopathy [Z66.06] Acute metabolic encephalopathy [T01.60] Patient Active Problem List   Diagnosis Date Noted   Osteoarthritis 09/01/2021   DM (diabetes mellitus), type 2 with renal complications ([place]) 10/93/2355   Chronic kidney disease, stage 3b ([place]) 08/31/2021   Encounter for well adult exam with abnormal findings 07/30/2018   Polyp of ascending colon    Hypothyroidism 08/02/2017   Personal history of ventricular tachycardia 08/02/2017   Diabetic peripheral neuropathy associated with type 2 diabetes mellitus ([place]) 07/02/2017   Hyperlipidemia 07/02/2017   Hypotension 05/06/2015   Colon cancer screening 12/21/2014   Acute sinus infection 06/06/2014   Allergic rhinitis 06/06/2014   Arthritis of knee, left 04/30/2014   Knee MCL sprain  03/28/2014   Pes anserine bursitis 02/23/2014   Spinal stenosis of lumbar region 02/23/2014   Lower back pain 01/23/2014   Bilateral knee pain 01/23/2014   Left lateral abdominal pain 01/23/2014   UTI (urinary tract infection) 08/03/2013   Encounter for therapeutic drug monitoring 73/22/0254   Chronic systolic CHF (congestive heart failure) ([place]) 03/20/2013   CKD (chronic kidney disease) 03/20/2013   HTN (hypertension) 12/15/2012   Obesity, unspecified 12/15/2012   Type 2 diabetes, uncontrolled, with circulatory disorder 12/15/2012   Left knee pain 08/30/2012   Lumbar spondylosis 01/11/2012   Coronary artery disease 01/31/2011   Implantable cardioverter-defibrillator (ICD) in situ 01/27/2011     ventricular tachycardia-polymorphic 01/27/2011   Ischemic cardiomyopathy 01/27/2011   PAF (paroxysmal atrial fibrillation) ([place]) 01/27/2011   PCP:  Pcp, No Pharmacy:   [id number] [place] [place] [address] [place] [address]Phone: [phone]: [phone]    Social Determinants of Health (SDOH) Interventions    Readmission Risk Interventions     No data to display

## 2021-09-02 NOTE — Evaluation (Signed)
Physical Therapy Evaluation Patient Details Name: [name] MRN: [id number] DOB: 01-Jun-1945 Today's Date: 09/02/2021  History of Present Illness  76 y.o. female presented to ED 6/10 due to lethargy, confusion, twitching movements of the eyes, mild weakness on the left side. 6/12 EEG negetive. 6/12 CT Head negative.  PJK:[id number] of chronic systolic heart failure status post ICD placement, history of A-fib, diabetes mellitus type 2, hypertension, CAD, chronic kidney disease stage III  Clinical Impression   Pt admitted secondary to problem above with deficits below. PTA patient was living at home with son with nearly 24/7 assist (per pt) and walking with a rollator when right knee bothering her. She reports the pain has been getting worse recently and denies falling.  Pt currently requires min guard assist to walk 6 ft with significantly increased time and limited by severe rt knee pain (pt tearful despite premedication for pain). Patient will need to be moving better prior to discharge home with HHPT. She reports in the past she has had an injection in her knee that was helpful.  Anticipate patient will benefit from PT to address problems listed below.Will continue to follow acutely to maximize functional mobility independence and safety.          Recommendations for follow up therapy are one component of a multi-disciplinary discharge planning process, led by the attending physician.  Recommendations may be updated based on patient status, additional functional criteria and insurance authorization.  Follow Up Recommendations Home health PT (but needs to be much more mobile prior to discharge home)    Assistance Recommended at Discharge Intermittent Supervision/Assistance  Patient can return home with the following  A lot of help with walking and/or transfers;Assistance with cooking/housework;Direct supervision/assist for medications management;Direct supervision/assist for financial  management;Help with stairs or ramp for entrance    Equipment Recommendations None recommended by PT  Recommendations for Other Services       Functional Status Assessment Patient has had a recent decline in their functional status and demonstrates the ability to make significant improvements in function in a reasonable and predictable amount of time.     Precautions / Restrictions Precautions Precautions: Fall Precaution Comments: seizure Restrictions Weight Bearing Restrictions: No      Mobility  Bed Mobility Overal bed mobility: Needs Assistance Bed Mobility: Supine to Sit, Sit to Supine     Supine to sit: HOB elevated, Supervision Sit to supine: Supervision   General bed mobility comments: very slow progression to sit at EOB with cuing necessary to redirect her to task; very distracted by knee pain    Transfers Overall transfer level: Needs assistance Equipment used: Rolling walker (2 wheels) Transfers: Sit to/from Stand Sit to Stand: Min guard           General transfer comment: Required Min Guard A for safety x 2 reps from EOB    Ambulation/Gait Ambulation/Gait assistance: Min guard Gait Distance (Feet): 6 Feet (2 forward; 2 backward, 2 sideways) Assistive device: Rolling walker (2 wheels) Gait Pattern/deviations: Step-to pattern, Decreased stride length, Decreased weight shift to right, Shuffle Gait velocity: very slow Gait velocity interpretation: <1.31 ft/sec, indicative of household ambulator   General Gait Details: pt unable to tolerate rt knee pain for farther distance; tearful  Stairs            Wheelchair Mobility    Modified Rankin (Stroke Patients Only)       Balance Overall balance assessment: Needs assistance Sitting-balance support: Feet supported, No upper extremity supported Sitting  balance-Leahy Scale: Fair     Standing balance support: During functional activity, Reliant on assistive device for balance, Single extremity  supported Standing balance-Leahy Scale: Fair                               Pertinent Vitals/Pain Pain Assessment Pain Assessment: 0-10 Pain Score: 10-Worst pain ever Pain Location: right knee at rest and with mobility Pain Descriptors / Indicators: Throbbing Pain Intervention(s): Limited activity within patient's tolerance, Monitored during session, Premedicated before session, Repositioned    Home Living Family/patient expects to be discharged to:: Private residence Living Arrangements: Children Available Help at Discharge: Family;Available PRN/intermittently (pt reports nearly 24/7 assist) Type of Home: House Home Access: Stairs to enter Entrance Stairs-Rails: None Entrance Stairs-Number of Steps: 1   Home Layout: One level Home Equipment: Rollator (4 wheels)      Prior Function Prior Level of Function : Needs assist             Mobility Comments: functional mobility with a rollator when her R knee hurt--which has been a lot more recently ADLs Comments: Patient reports she completed all ADLs,  medication mgmt.     Hand Dominance   Dominant Hand: Right    Extremity/Trunk Assessment   Upper Extremity Assessment Upper Extremity Assessment: Defer to OT evaluation    Lower Extremity Assessment Lower Extremity Assessment: Generalized weakness;RLE deficits/detail RLE Deficits / Details: incr knee pain; pt reports nothing helps and has recently become much more severe    Cervical / Trunk Assessment Cervical / Trunk Assessment: Other exceptions Cervical / Trunk Exceptions: overweight  Communication   Communication: No difficulties  Cognition Arousal/Alertness: Awake/alert Behavior During Therapy: WFL for tasks assessed/performed Overall Cognitive Status: No family/caregiver present to determine baseline cognitive functioning Area of Impairment: Attention, Safety/judgement, Awareness, Problem solving                 Orientation Level:   (NT) Current Attention Level: Sustained   Following Commands: Follows one step commands consistently Safety/Judgement: Decreased awareness of safety Awareness: Intellectual Problem Solving: Slow processing, Decreased initiation, Requires verbal cues General Comments: Pt required significant time through all mobility.        General Comments General comments (skin integrity, edema, etc.): Reports in the past when knee hurting this much she had an injection that helped    Exercises Other Exercises Other Exercises: encouraged active, partial knee extension in sitting for "warm-up" before standing; pt refused stating nothing helps the pain   Assessment/Plan    PT Assessment Patient needs continued PT services  PT Problem List Decreased strength;Decreased activity tolerance;Decreased mobility;Decreased cognition;Decreased safety awareness;Obesity;Pain       PT Treatment Interventions DME instruction;Gait training;Stair training;Functional mobility training;Therapeutic activities;Therapeutic exercise;Cognitive remediation;Patient/family education    PT Goals (Current goals can be found in the Care Plan section)  Acute Rehab PT Goals Patient Stated Goal: to decr right knee pain PT Goal Formulation: With patient Time For Goal Achievement: 09/16/21 Potential to Achieve Goals: Good    Frequency Min 3X/week     Co-evaluation               AM-PAC PT "6 Clicks" Mobility  Outcome Measure Help needed turning from your back to your side while in a flat bed without using bedrails?: None Help needed moving from lying on your back to sitting on the side of a flat bed without using bedrails?: A Little Help needed moving to and  from a bed to a chair (including a wheelchair)?: A Little Help needed standing up from a chair using your arms (e.g., wheelchair or bedside chair)?: A Little Help needed to walk in hospital room?: Total Help needed climbing 3-5 steps with a railing? : Total 6  Click Score: 15    End of Session Equipment Utilized During Treatment: Gait belt Activity Tolerance: Patient limited by pain Patient left: in bed;with call bell/phone within reach;with bed alarm set Nurse Communication: Mobility status;Other (comment) (knee pain limiting; needs purewick) PT Visit Diagnosis: Pain;Difficulty in walking, not elsewhere classified (R26.2) Pain - Right/Left: Right Pain - part of body: Knee    Time: 8453-6468 PT Time Calculation (min) (ACUTE ONLY): 25 min   Charges:   PT Evaluation $PT Eval Low Complexity: 1 Low PT Treatments $Gait Training: 8-22 mins         Arby Barrette, PT Acute Rehabilitation Services  Office [phone]   Rexanne Mano 09/02/2021, 10:15 AM

## 2021-09-03 ENCOUNTER — Inpatient Hospital Stay (HOSPITAL_COMMUNITY): Payer: Medicare HMO

## 2021-09-03 ENCOUNTER — Other Ambulatory Visit (HOSPITAL_COMMUNITY): Payer: Self-pay

## 2021-09-03 DIAGNOSIS — N1832 Chronic kidney disease, stage 3b: Secondary | ICD-10-CM

## 2021-09-03 DIAGNOSIS — I5022 Chronic systolic (congestive) heart failure: Secondary | ICD-10-CM

## 2021-09-03 DIAGNOSIS — G9341 Metabolic encephalopathy: Secondary | ICD-10-CM | POA: Diagnosis not present

## 2021-09-03 DIAGNOSIS — E1122 Type 2 diabetes mellitus with diabetic chronic kidney disease: Secondary | ICD-10-CM | POA: Diagnosis not present

## 2021-09-03 LAB — GLUCOSE, CAPILLARY
Glucose-Capillary: 101 mg/dL — ABNORMAL HIGH (ref 70–99)
Glucose-Capillary: 121 mg/dL — ABNORMAL HIGH (ref 70–99)
Glucose-Capillary: 87 mg/dL (ref 70–99)

## 2021-09-03 MED ORDER — BUPIVACAINE HCL (PF) 0.25 % IJ SOLN
INTRAMUSCULAR | Status: AC
  Filled 2021-09-03: qty 30

## 2021-09-03 MED ORDER — METHYLPREDNISOLONE ACETATE 80 MG/ML IJ SUSP
INTRAMUSCULAR | Status: AC
  Filled 2021-09-03: qty 1

## 2021-09-03 MED ORDER — ACETAMINOPHEN 325 MG PO TABS
650.0000 mg | ORAL_TABLET | ORAL | Status: DC | PRN
Start: 2021-09-03 — End: 2022-10-03

## 2021-09-03 MED ORDER — LEVETIRACETAM 500 MG PO TABS
500.0000 mg | ORAL_TABLET | Freq: Two times a day (BID) | ORAL | 1 refills | Status: DC
  Filled 2021-09-03: qty 60, 30d supply, fill #0

## 2021-09-03 NOTE — Plan of Care (Signed)

## 2021-09-03 NOTE — TOC Transition Note (Signed)
Transition of Care [facility]) - CM/SW Discharge Note   Patient Details  Name: [name] MRN: [id number] Date of Birth: 10/08/1945  [facility] CM/SW Contact:  Carles Collet, RN Phone Number: 09/03/2021, 9:50 AM   Clinical Narrative:   Spoke w patient at bedside, she is alert oriented and conversational this morning.  We discussed therapy recommendations and discussed her in-room mobility. She explains that she has been more ambulatory and OOB to use the [facility] and feels that she would do fine going home with home health services. She does not have a preference for [facility] provider. Referral made to [facility], pending. She has a rollator at home, a 3/1 has been ordered through [place] to be delivered to the room for her to take home with her.  No other TOC needs identified.     Final next level of care: [place] Barriers to Discharge: No Barriers Identified   Patient Goals and CMS Choice Patient states their goals for this hospitalization and ongoing recovery are:: to go home CMS Medicare.gov Compare Post Acute Care list provided to:: Patient Choice offered to / list presented to : Patient  Discharge Placement                       Discharge Plan and Services   Discharge Planning Services: CM Consult            DME Arranged: 3-N-1 DME Agency: [facility] [employer] Date DME Agency Contacted: 09/03/21 Time DME Agency Contacted: [phone] Representative spoke with at DME Agency: [place]on Grills HH Arranged: PT, OT [place] Agency: [place] Date [place]: 09/03/21 Time [place]: [phone] Representative spoke with at [place]: [place] Determinants of Health ([place]) Interventions     Readmission Risk Interventions     No data to display

## 2021-09-03 NOTE — Discharge Instructions (Signed)
Seizure precautions: Per [place] DMV statutes, patients with seizures are not allowed to drive until they have been seizure-free for six months and cleared by a physician    Use caution when using heavy equipment or power tools. Avoid working on ladders or at heights. Take showers instead of baths. Ensure the water temperature is not too high on the home water heater. Do not go swimming alone. Do not lock yourself in a room alone (i.e. bathroom). When caring for infants or small children, sit down when holding, feeding, or changing them to minimize risk of injury to the child in the event you have a seizure. Maintain good sleep hygiene. Avoid alcohol.    If patient has another seizure, call 911 and bring them back to the ED if: A.  The seizure lasts longer than 5 minutes.      B.  The patient doesn't wake shortly after the seizure or has new problems such as difficulty seeing, speaking or moving following the seizure C.  The patient was injured during the seizure D.  The patient has a temperature over 102 F (39C) E.  The patient vomited during the seizure and now is having trouble breathing    During the Seizure   - First, ensure adequate ventilation and place patients on the floor on their left side  Loosen clothing around the neck and ensure the airway is patent. If the patient is clenching the teeth, do not force the mouth open with any object as this can cause severe damage - Remove all items from the surrounding that can be hazardous. The patient may be oblivious to what's happening and may not even know what he or she is doing. If the patient is confused and wandering, either gently guide him/her away and block access to outside areas - Reassure the individual and be comforting - Call 911. In most cases, the seizure ends before EMS arrives. However, there are cases when seizures may last over 3 to 5 minutes. Or the individual may have developed breathing difficulties or severe  injuries. If a pregnant patient or a person with diabetes develops a seizure, it is prudent to call an ambulance. - Finally, if the patient does not regain full consciousness, then call EMS. Most patients will remain confused for about 45 to 90 minutes after a seizure, so you must use judgment in calling for help.      After the Seizure (Postictal Stage)   After a seizure, most patients experience confusion, fatigue, muscle pain and/or a headache. Thus, one should permit the individual to sleep. For the next few days, reassurance is essential. Being calm and helping reorient the person is also of importance.   Most seizures are painless and end spontaneously. Seizures are not harmful to others but can lead to complications such as stress on the lungs, brain and the heart. Individuals with prior lung problems may develop labored breathing and respiratory distress.  

## 2021-09-03 NOTE — Progress Notes (Signed)
Order to discharge pt home.  Discharge instructions/AVS given to patient and reviewed - education provided as needed.  Pt advised to call PCP and/or come back to the hospital if there are any problems. Pt verbalized understanding.     Pt waiting on prescription medications to be delivered from [facility].

## 2021-09-03 NOTE — Progress Notes (Signed)
Physical Therapy Treatment Patient Details Name: [name] MRN: [id number] DOB: 26-Apr-1945 Today's Date: 09/03/2021   History of Present Illness 76 y.o. female presented to ED 6/10 due to lethargy, confusion, twitching movements of the eyes, mild weakness on the left side. 6/12 EEG negetive. 6/12 CT Head negative.  [id number] of chronic systolic heart failure status post ICD placement, history of A-fib, diabetes mellitus type 2, hypertension, CAD, chronic kidney disease stage III    PT Comments    Pt received in supine, agreeable to therapy session with emphasis on gait/stair training. Pt with improved pain tolerance after premedication this session, able to perform short household distance gait task with min safety cues and good RW use. Pt needing up to min guard to perform step x2 to simulate home entry using RW, no loss of balance. Pt needing safety cues for hand placement with transfers, up in chair with alarm on for safety at end of session. Pt c/o itching at scalp/runny nose due to EEG glue, RN notified. Pt continues to benefit from PT services to progress toward functional mobility goals.    Recommendations for follow up therapy are one component of a multi-disciplinary discharge planning process, led by the attending physician.  Recommendations may be updated based on patient status, additional functional criteria and insurance authorization.  Follow Up Recommendations  Home health PT     Assistance Recommended at Discharge Intermittent Supervision/Assistance  Patient can return home with the following Assistance with cooking/housework;Direct supervision/assist for medications management;Direct supervision/assist for financial management;Help with stairs or ramp for entrance;A little help with walking and/or transfers   Equipment Recommendations  None recommended by PT (she would benefit from 3 in 1 due to increased R knee pain and fall risk, especially for getting up to toilet  at night)    Recommendations for Other Services       Precautions / Restrictions Precautions Precautions: Fall Precaution Comments: seizure Restrictions Weight Bearing Restrictions: No     Mobility  Bed Mobility Overal bed mobility: Needs Assistance Bed Mobility: Supine to Sit     Supine to sit: HOB elevated, Supervision Sit to supine: Supervision   General bed mobility comments: slow progression to sit at EOB, no physical assist needed    Transfers Overall transfer level: Needs assistance Equipment used: Rolling walker (2 wheels) Transfers: Sit to/from Stand Sit to Stand: Min guard           General transfer comment: Required Min Guard A for safety from EOB and from recliner chair, pt needs repetition of cues for safe hand placement standing to RW from various surfaces    Ambulation/Gait Ambulation/Gait assistance: Min guard, Supervision Gait Distance (Feet): 35 Feet Assistive device: Rolling walker (2 wheels) Gait Pattern/deviations: Decreased stride length, Decreased weight shift to right, Step-through pattern Gait velocity: very slow     General Gait Details: pt agreeable with encouragement, self-limiting distance due to R knee pain and imminent DC, good use of RW but does not want height lowered although therapist explained this may help with her knee pain   Stairs Stairs: Yes Stairs assistance: Min guard Stair Management: With walker, Step to pattern, Forwards, Backwards Number of Stairs: 2 General stair comments: cues for step sequencing, increased time to perform, no LOB; single step in room x2 reps with RW   Wheelchair Mobility    Modified Rankin (Stroke Patients Only)       Balance Overall balance assessment: Needs assistance Sitting-balance support: Feet supported, No upper extremity supported Sitting  balance-Leahy Scale: Fair     Standing balance support: During functional activity, Reliant on assistive device for balance, Single  extremity supported Standing balance-Leahy Scale: Fair                              Cognition Arousal/Alertness: Awake/alert Behavior During Therapy: WFL for tasks assessed/performed Overall Cognitive Status: No family/caregiver present to determine baseline cognitive functioning Area of Impairment: Attention, Safety/judgement, Awareness, Problem solving                 Orientation Level:  (NT) Current Attention Level: Sustained   Following Commands: Follows one step commands consistently Safety/Judgement: Decreased awareness of safety Awareness: Intellectual Problem Solving: Slow processing, Decreased initiation, Requires verbal cues General Comments: Pt required increased time through all mobility, decreased attention to safety cues for hand placement, pt able to follow instruction on second stand with increased time/cues.        Exercises      General Comments General comments (skin integrity, edema, etc.): SpO2 93% HR 77 bpm, no dizziness reported      Pertinent Vitals/Pain Pain Assessment Pain Assessment: 0-10 Pain Score: 6  Pain Location: right knee at rest and with mobility, increased to 7/10 with exertion Pain Descriptors / Indicators: Throbbing, Grimacing, Discomfort Pain Intervention(s): Monitored during session, Limited activity within patient's tolerance, Premedicated before session, Repositioned (encouraged pt to try ice and elevating RLE at rest however she defers, sitting in chair with feet down)     PT Goals (current goals can now be found in the care plan section) Acute Rehab PT Goals Patient Stated Goal: to decr right knee pain PT Goal Formulation: With patient Time For Goal Achievement: 09/16/21 Progress towards PT goals: Progressing toward goals    Frequency    Min 3X/week      PT Plan Current plan remains appropriate       AM-PAC PT "6 Clicks" Mobility   Outcome Measure  Help needed turning from your back to your side  while in a flat bed without using bedrails?: None Help needed moving from lying on your back to sitting on the side of a flat bed without using bedrails?: A Little Help needed moving to and from a bed to a chair (including a wheelchair)?: A Little Help needed standing up from a chair using your arms (e.g., wheelchair or bedside chair)?: A Little Help needed to walk in hospital room?: A Little Help needed climbing 3-5 steps with a railing? : A Little 6 Click Score: 19    End of Session Equipment Utilized During Treatment: Gait belt Activity Tolerance: Patient tolerated treatment well Patient left: with call bell/phone within reach;in chair;with chair alarm set Nurse Communication: Mobility status;Other (comment) (pt awaiting family arrival for clothing, chair alarm on) PT Visit Diagnosis: Pain;Difficulty in walking, not elsewhere classified (R26.2) Pain - Right/Left: Right Pain - part of body: Knee     Time: 1042-1100 PT Time Calculation (min) (ACUTE ONLY): 18 min  Charges:  $Gait Training: 8-22 mins                     Tammara Massing P., PTA Acute Rehabilitation Services Secure Chat Preferred 9a-5:30pm Office: [place] 09/03/2021, 11:21 AM

## 2021-09-03 NOTE — Discharge Summary (Signed)
Physician Discharge Summary  [name] OBS:[id number] DOB: [birth year] DOA: 08/30/2021  PCP: Merryl Hacker, No  Admit date: 08/30/2021 Discharge date: 09/03/2021  Admitted From: Home Disposition:  Home   Recommendations for Outpatient Follow-up:  Follow up with PCP in 1-2 weeks Please obtain BMP/CBC in one week Follow-up with neurology as an outpatient, ambulatory referral has been sent  Home Health:YES Equipment/Devices: 3 in 1   Discharge Condition:Stable CODE STATUS:FULL Diet recommendation: Heart Healthy   Brief/Interim Summary: Ms. [name] is a 76 yo female with PMH afib, sCHF, vtach s/p ICD, DMII, CAD, HTN, HLD, CVA, sleep apnea who presented to the ER with increased lethargy.  She was found to be confused with twitching eye movements concerning for seizures and some mild weakness on the left side. She was admitted for stroke work-up and also underwent EEG monitoring.  EEG showed periodic discharges in the frontal region and she was converted to LTM and started on Ativan challenge.  She was also loaded with Keppra. 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  Neurology was also consulted on admission.  Acute metabolic encephalopathy felt to be secondary to postictal status, Seizures. 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 -Noted on Keppra, continue on discharge . -Repeat  CT head with no acute findings . -MRI unable to be performed due to incompatible ICD -Repeat CT head performed on 09/01/2021, no acute findings and remote right frontal infarct noted -Patient has been continued on Keppra due to higher suspicion for seizure on admission per neurology -Patient was given seizure precaution on discharge, she was educated about seizure precautions.    Hypothyroidism - Med rec completed, does not appear to be on Synthroid at home - TSH, 2.518   Right knee osteoarthritis - Patient complained of right knee pain after admission - X-ray obtained which shows osteoarthritis involving medial compartment, no acute findings -Patient report history of right knee osteoarthritis used to follow with orthopedic in the past, she was told she had bone to bone with bone spurs, she was instructed to follow-up with orthopedic as an outpatient.    PAF (paroxysmal atrial fibrillation) (HCC) -Eliquis was held initially, okay to resume by neurology, she will be discharged back on her home Eliquis.   Chronic kidney disease, stage 3b ([place]) - patient has history of CKD3b. Baseline creat ~ 1.4 - 1.6, eGFR 30-35 - currently at baseline      DM (diabetes mellitus), type 2 with renal complications (HCC) -Resume home regimen on discharge   Chronic systolic CHF (congestive heart failure) (HCC) - no s/s exacerbation - ICD in place  -Continue Entresto   HTN (hypertension) -Continue with home medications     Discharge Diagnoses:  Active Problems:   Hypothyroidism   Osteoarthritis   PAF (paroxysmal atrial fibrillation) (HCC)   Implantable cardioverter-defibrillator (ICD) in situ   Ischemic cardiomyopathy   HTN (hypertension)   Chronic systolic CHF (congestive heart failure) (HCC)   DM (diabetes mellitus), type 2 with renal complications (HCC)   Chronic kidney disease, stage 3b [facility])    Discharge Instructions  Discharge Instructions     Ambulatory referral to Neurology   Complete by: As directed    An appointment is requested in approximately: 4 weeks   Diet - low sodium heart healthy   Complete  by: As directed    Discharge instructions   Complete by: As directed    Seizure precautions: Per [facility] statutes, patients with seizures are not allowed to drive until they have been seizure-free for six months and cleared by a physician     Use caution when using heavy equipment or power tools. Avoid working on ladders or at heights. Take showers instead of baths. Ensure the water temperature is not too high on the home water heater. Do not go swimming alone. Do not lock yourself in a room alone (i.e. bathroom). When caring for infants or small children, sit down when holding, feeding, or changing them to minimize risk of injury to the child in the event you have a seizure. Maintain good sleep hygiene. Avoid alcohol.    If patient has another seizure, call 911 and bring them back to the ED if: A.  The seizure lasts longer than 5 minutes.      B.  The patient doesn't wake shortly after the seizure or has new problems such as difficulty seeing, speaking or moving following the seizure C.  The patient was injured during the seizure D.  The patient has a temperature over 102 F (39C) E.  The patient vomited during the seizure and now is having trouble breathing    During the Seizure   - First, ensure adequate ventilation and place patients on the floor on their left side  Loosen clothing around the neck and ensure the airway is patent. If the patient is clenching the teeth, do not force the mouth open with any object as this can cause severe damage - Remove all items from the surrounding that can be hazardous. The patient may be oblivious to what's happening and may not even know what he or she is doing. If the patient is confused and wandering, either gently guide him/her away and block access to outside areas - Reassure the individual and be comforting - Call 911. In most cases, the seizure ends before EMS arrives. However, there are cases when seizures may last over 3 to 5 minutes. Or the individual may have developed breathing difficulties or severe injuries. If a pregnant patient or a person with diabetes develops a seizure, it is prudent to call an ambulance. - Finally, if the patient does not regain full consciousness, then call  EMS. Most patients will remain confused for about 45 to 90 minutes after a seizure, so you must use judgment in calling for help.    After the Seizure (Postictal Stage)   After a seizure, most patients experience confusion, fatigue, muscle pain and/or a headache. Thus, one should permit the individual to sleep. For the next few days, reassurance is essential. Being calm and helping reorient the person is also of importance.   Most seizures are painless and end spontaneously. Seizures are not harmful to others but can lead to complications such as stress on the lungs, brain and the heart. Individuals with prior lung problems may develop labored breathing and respiratory distress.        Increase activity slowly   Complete by: As directed       Allergies as of 09/03/2021       Reactions   Veltassa [patiromer] Diarrhea   Darvon Other (See Comments)   indigestion   Promethazine Hcl Other (See Comments)   hyperactivity   Spironolactone Diarrhea, Nausea And Vomiting   Plavix [clopidogrel Bisulfate] Rash        Medication List  TAKE these medications    Accu-Chek Nano SmartView w/Device Kit Use to test blood sugar 4 times daily as instructed. Dx code: E11.59   acetaminophen 325 MG tablet Commonly known as: TYLENOL Take 2 tablets (650 mg total) by mouth every 4 (four) hours as needed for mild pain or moderate pain. What changed:  medication strength how much to take when to take this reasons to take this   amiodarone 100 MG tablet Commonly known as: PACERONE Take 1 tablet (100 mg total) by mouth daily. What changed: when to take this   busPIRone 5 MG tablet Commonly known as: BUSPAR Take 1 tablet (5 mg total) by mouth 2 (two) times daily.   carvedilol 12.5 MG tablet Commonly known as: COREG Take 1 tablet (12.5 mg total) by mouth 2 (two) times daily with a meal.   dicyclomine 10 MG capsule Commonly known as: BENTYL TAKE 1 CAPSULE (10 MG TOTAL) BY MOUTH 4 (FOUR)  TIMES DAILY BEFORE MEALS AND AT BEDTIME.   Eliquis 5 MG Tabs tablet Generic drug: apixaban TAKE 1 TABLET BY MOUTH TWICE A DAY What changed: how much to take   empagliflozin 10 MG Tabs tablet Commonly known as: JARDIANCE Take 10 mg by mouth every morning.   Entresto 49-51 MG Generic drug: sacubitril-valsartan TAKE 1 TABLET BY MOUTH TWICE A DAY   EYE VITAMINS PO Take 1 tablet by mouth every morning.   fluticasone 50 MCG/ACT nasal spray Commonly known as: FLONASE Place 1 spray into both nostrils daily as needed for allergies or rhinitis.   furosemide 20 MG tablet Commonly known as: LASIX Take 1 tablet (20 mg total) by mouth daily. What changed: when to take this   gabapentin 100 MG capsule Commonly known as: NEURONTIN Take 2 capsules (200 mg total) by mouth 2 (two) times daily.   glucose blood test strip Commonly known as: Accu-Chek SmartView Use to test blood sugar 4 times daily as instructed. Dx code E11.59   Levemir FlexTouch 100 UNIT/ML FlexPen Generic drug: insulin detemir Inject 20 Units into the skin daily. What changed: when to take this   levETIRAcetam 500 MG tablet Commonly known as: KEPPRA Take 1 tablet (500 mg total) by mouth 2 (two) times daily.   magnesium oxide 400 (240 Mg) MG tablet Commonly known as: MAG-OX TAKE 1 TABLET BY MOUTH TWICE A DAY   nitroGLYCERIN 0.4 MG SL tablet Commonly known as: NITROSTAT Place 1 tablet (0.4 mg total) under the tongue every 5 (five) minutes as needed for chest pain.   NovoLOG FlexPen 100 UNIT/ML FlexPen Generic drug: insulin aspart Inject up to 15 units daily under skin as advised What changed:  how much to take how to take this when to take this additional instructions   omeprazole 20 MG tablet Commonly known as: PRILOSEC OTC Take 20 mg by mouth every morning.   rosuvastatin 40 MG tablet Commonly known as: CRESTOR Take 1 tablet (40 mg total) by mouth daily. What changed: when to take this   Trulicity  1.5 KH/9.9HF Sopn Generic drug: Dulaglutide Inject 1.5 mg into the skin once a week.   Vitamin D3 50 MCG (2000 UT) Tabs Take 2,000 Units by mouth every morning.               Durable Medical Equipment  (From admission, onward)           Start     Ordered   09/03/21 0949  For home use only DME 3 n 1  Once  09/03/21 [place], Echo Follow up.   Specialty: Home Health Services Why: for home health PT and OT Contact information: [place] [address] [phone]                Allergies  Allergen Reactions   Veltassa [Patiromer] Diarrhea   Darvon Other (See Comments)    indigestion   Promethazine Hcl Other (See Comments)    hyperactivity   Spironolactone Diarrhea and Nausea And Vomiting   Plavix [Clopidogrel Bisulfate] Rash    Consultations: Neurology    Procedures/Studies: CT HEAD WO CONTRAST (5MM)  Result Date: 09/01/2021 CLINICAL DATA:  Delirium EXAM: CT HEAD WITHOUT CONTRAST TECHNIQUE: Contiguous axial images were obtained from the base of the skull through the vertex without intravenous contrast. RADIATION DOSE REDUCTION: This exam was performed according to the departmental dose-optimization program which includes automated exposure control, adjustment of the mA and/or kV according to patient size and/or use of iterative reconstruction technique. COMPARISON:  Two days ago FINDINGS: Brain: Multilevel unavoidable artifact from EEG hardware. Moderate size remote right frontal infarct. No evidence of acute infarct, hemorrhage, hydrocephalus, or collection. Vascular: No hyperdense vessel or unexpected calcification. Skull: Normal. Negative for fracture or focal lesion. Sinuses/Orbits: Bilateral cataract resection IMPRESSION: 1. No acute finding. 2. Remote right frontal infarct. Electronically Signed   By: Jorje Guild M.D.   On: 09/01/2021 11:50   DG Knee  Right Port  Result Date: 08/31/2021 CLINICAL DATA:  Acute RIGHT knee pain EXAM: PORTABLE RIGHT KNEE - 1-2 VIEW COMPARISON:  None Available. FINDINGS: There is narrowing of the medial compartment with associated osteophytosis. No joint effusion. No acute fracture dislocation. IMPRESSION: 1. Osteoarthritis of the medial compartment. 2. No acute findings. Electronically Signed   By: Suzy Bouchard M.D.   On: 08/31/2021 21:32   ECHOCARDIOGRAM COMPLETE  Result Date: 08/31/2021    ECHOCARDIOGRAM REPORT   Patient Name:   [name] Date of Exam: 08/31/2021 Medical Rec #:  [id number]         Height:       63.0 in Accession #:    [id number]        Weight:       178.0 lb Date of Birth:  12-20-1945        BSA:          1.840 m Patient Age:    62 years          BP:           151/68 mmHg Patient Gender: F                 HR:           64 bpm. Exam Location:  Inpatient Procedure: 2D Echo, Cardiac Doppler and Color Doppler Indications:    Stroke  History:        Patient has prior history of Echocardiogram examinations, most                 recent 01/14/2021. CHF, CAD; Risk Factors:Hypertension and                 Diabetes.  Sonographer:    Jefferey Pica Referring Phys: [place]  1. Left ventricular ejection fraction, by estimation, is 40%. The left ventricle has moderately decreased function. The left ventricle demonstrates regional wall motion abnormalities (see  scoring diagram/findings for description). There is mild concentric left ventricular hypertrophy. Left ventricular diastolic parameters are consistent with Grade I diastolic dysfunction (impaired relaxation). There is hypokinesis of the left ventricular, basal-mid inferoseptal wall and inferior wall.  2. Right ventricular systolic function was not well visualized. The right ventricular size is not well visualized.  3. Left atrial size was moderately dilated.  4. Right atrial size was mildly dilated.  5. The mitral valve is  normal in structure. Trivial mitral valve regurgitation. No evidence of mitral stenosis.  6. The aortic valve is grossly normal. There is mild calcification of the aortic valve. Aortic valve regurgitation is mild. Aortic valve sclerosis is present, with no evidence of aortic valve stenosis.  7. The inferior vena cava is normal in size with greater than 50% respiratory variability, suggesting right atrial pressure of 3 mmHg. Comparison(s): No significant change from prior study. Prior images reviewed side by side. Conclusion(s)/Recommendation(s): No intracardiac source of embolism detected on this transthoracic study. Consider a transesophageal echocardiogram to exclude cardiac source of embolism if clinically indicated. FINDINGS  Left Ventricle: Left ventricular ejection fraction, by estimation, is 40%. The left ventricle has moderately decreased function. The left ventricle demonstrates regional wall motion abnormalities. The left ventricular internal cavity size was normal in size. There is mild concentric left ventricular hypertrophy. Left ventricular diastolic parameters are consistent with Grade I diastolic dysfunction (impaired relaxation). Right Ventricle: The right ventricular size is not well visualized. Right vetricular wall thickness was not well visualized. Right ventricular systolic function was not well visualized. Left Atrium: Left atrial size was moderately dilated. Right Atrium: Right atrial size was mildly dilated. Pericardium: Trivial pericardial effusion is present. Mitral Valve: The mitral valve is normal in structure. Trivial mitral valve regurgitation. No evidence of mitral valve stenosis. Tricuspid Valve: The tricuspid valve is normal in structure. Tricuspid valve regurgitation is trivial. No evidence of tricuspid stenosis. Aortic Valve: The aortic valve is grossly normal. There is mild calcification of the aortic valve. Aortic valve regurgitation is mild. Aortic regurgitation PHT measures  368 msec. Aortic valve sclerosis is present, with no evidence of aortic valve stenosis. Aortic valve peak gradient measures 7.2 mmHg. Pulmonic Valve: The pulmonic valve was not well visualized. Pulmonic valve regurgitation is trivial. No evidence of pulmonic stenosis. Aorta: The aortic root and ascending aorta are structurally normal, with no evidence of dilitation. Venous: The inferior vena cava is normal in size with greater than 50% respiratory variability, suggesting right atrial pressure of 3 mmHg. IAS/Shunts: The atrial septum is grossly normal. Additional Comments: A device lead is visualized.  LEFT VENTRICLE PLAX 2D LVIDd:         5.40 cm   Diastology LVIDs:         4.60 cm   LV e' lateral:   7.37 cm/s LV PW:         1.40 cm   LV E/e' lateral: 8.2 LV IVS:        1.50 cm LVOT diam:     2.00 cm LV SV:         70 LV SV Index:   38 LVOT Area:     3.14 cm  RIGHT VENTRICLE RV S prime:     8.00 cm/s LEFT ATRIUM              Index        RIGHT ATRIUM           Index LA diam:  2.50 cm  1.36 cm/m   RA Area:     17.30 cm LA Vol (A2C):   102.0 ml 55.43 ml/m  RA Volume:   47.70 ml  25.92 ml/m LA Vol (A4C):   80.4 ml  43.69 ml/m LA Biplane Vol: 93.3 ml  50.70 ml/m  AORTIC VALVE                 PULMONIC VALVE AV Area (Vmax): 2.19 cm     PV Vmax:       0.78 m/s AV Vmax:        134.00 cm/s  PV Peak grad:  2.5 mmHg AV Peak Grad:   7.2 mmHg LVOT Vmax:      93.30 cm/s LVOT Vmean:     54.600 cm/s LVOT VTI:       0.223 m AI PHT:         368 msec  AORTA Ao Root diam: 3.50 cm Ao Asc diam:  3.20 cm MITRAL VALVE MV Area (PHT): 4.12 cm    SHUNTS MV Decel Time: 184 msec    Systemic VTI:  0.22 m MV E velocity: 60.40 cm/s  Systemic Diam: 2.00 cm MV A velocity: 84.80 cm/s MV E/A ratio:  0.71 Buford Dresser MD Electronically signed by Buford Dresser MD Signature Date/Time: 08/31/2021/3:32:06 PM    Final    DG CHEST PORT 1 VIEW  Result Date: 08/31/2021 CLINICAL DATA:  Altered mental status. EXAM: PORTABLE  CHEST 1 VIEW COMPARISON:  01/27/11 FINDINGS: There is a left chest wall ICD with leads in the right atrial appendage and right ventricle. Normal cardiomediastinal contours. No pleural effusion. Pulmonary vascular congestion. No airspace densities. Small, 1.9 cm indeterminate linear foreign body projects over the right upper lobe between the right fifth and sixth rib spaces which is of uncertain significance and may be external to the patient. The visualized osseous structures are unremarkable. IMPRESSION: 1. Pulmonary vascular congestion. 2. Small, indeterminate linear foreign body projects over the right upper lobe between the right fifth and 6 rib spaces which is of uncertain significance and may be external to the patient. Correlation with physical exam findings advised. Electronically Signed   By: Kerby Moors M.D.   On: 08/31/2021 09:03   Overnight EEG with video  Result Date: 08/31/2021 Lora Havens, MD     09/01/2021 10:27 AM Patient Name: [name] MRN: [id number] Epilepsy Attending: Lora Havens Referring Physician/Provider: Rosalin Hawking, MD Duration: 08/30/2021 2352 to 08/31/2021 2352  Patient history: 76yo m with right frontal infarct now with ams. EEG to evaluate for seizure  Level of alertness: lethargic  AEDs during EEG study: LEV  Technical aspects: This EEG study was done with scalp electrodes positioned according to the 10-20 International system of electrode placement. Electrical activity was acquired at a sampling rate of $Remov'500Hz'aZCujs$  and reviewed with a high frequency filter of $RemoveB'70Hz'tRkWyczg$  and a low frequency filter of $RemoveB'1Hz'gdWGVCfC$ . EEG data were recorded continuously and digitally stored.  Description: No posterior dominant rhythm was seen. EEG showed continuous generalized 3 to 6 Hz theta-delta slowing. Generalized and lateralized right hemisphere periodic discharges with triphasic morphology at  1.5-2 Hz were noted, more prominent when awake, stimulated. Hyperventilation and photic stimulation were  not performed.   Of note, parts of study were technically difficult due to significant electrode artifact.  ABNORMALITY - Periodic discharges with triphasic morphology, generalized and lateralized right hemisphere ( GPDs) - Continuous slow, generalized  IMPRESSION: This technically difficult study showed periodic discharges  at 1.5-'2Hz'$  which are on the ictal-interictal continuum. However, the frequency, morphology and reactivity to stimulation is more commonly indicative of toxic-metabolic causes.  Additionally, there is moderate diffuse encephalopathy, nonspecific etiology. No definite seizures were seen throughout the recording.   Lora Havens   EEG adult  Result Date: 08/31/2021 Lora Havens, MD     08/31/2021  9:10 AM Patient Name: [name] MRN: [id number] Epilepsy Attending: Lora Havens Referring Physician/Provider: Rosalin Hawking, MD Date: 08/31/2021 Duration: 25.18 mins Patient history: 76yo m with right frontal infarct now with ams. EEG to evaluate for seizure Level of alertness: Awake AEDs during EEG study: None Technical aspects: This EEG study was done with scalp electrodes positioned according to the 10-20 International system of electrode placement. Electrical activity was acquired at a sampling rate of $Remov'500Hz'gEQKQB$  and reviewed with a high frequency filter of $RemoveB'70Hz'IqFebXFw$  and a low frequency filter of $RemoveB'1Hz'sIvpNlTp$ . EEG data were recorded continuously and digitally stored. Description: No posterior dominant rhythm was seen. EEG showed continuous generalized 3 to 6 Hz theta-delta slowing. Generalized and lateralized right hemisphere periodic discharges with triphasic morphology at  1.5-2 Hz were noted. Hyperventilation and photic stimulation were not performed.   ABNORMALITY - Periodic discharges with triphasic morphology, generalized and lateralized right hemisphere ( GPDs) - Continuous slow, generalized IMPRESSION: This study showed periodic discharges at 1.5-$RemoveBeforeDEI'2Hz'IphDqzPUSrGcaTss$  which are on the ictal-interictal  continuum. The frequency and morphology is more commonly indicative of toxic-metabolic causes. However, the predominance in right hemipshere In the setting of underlying chronic infarct could be suggestive of potential epileptogenicity. Consider ativan challenge and prolonged monitoring for further characterization. Additionally, this study is suggestive of moderate diffuse encephalopathy, nonspecific etiology. No definite seizures were seen throughout the recording. Lora Havens   CT ANGIO HEAD NECK W WO CM  Result Date: 08/30/2021 CLINICAL DATA:  Acute neurologic deficit EXAM: CT ANGIOGRAPHY HEAD AND NECK TECHNIQUE: Multidetector CT imaging of the head and neck was performed using the standard protocol during bolus administration of intravenous contrast. Multiplanar CT image reconstructions and MIPs were obtained to evaluate the vascular anatomy. Carotid stenosis measurements (when applicable) are obtained utilizing NASCET criteria, using the distal internal carotid diameter as the denominator. RADIATION DOSE REDUCTION: This exam was performed according to the departmental dose-optimization program which includes automated exposure control, adjustment of the mA and/or kV according to patient size and/or use of iterative reconstruction technique. CONTRAST:  44mL OMNIPAQUE IOHEXOL 350 MG/ML SOLN COMPARISON:  None Available. FINDINGS: CTA NECK FINDINGS SKELETON: There is no bony spinal canal stenosis. No lytic or blastic lesion. OTHER NECK: Normal pharynx, larynx and major salivary glands. No cervical lymphadenopathy. Unremarkable thyroid gland. UPPER CHEST: No pneumothorax or pleural effusion. No nodules or masses. AORTIC ARCH: There is calcific atherosclerosis of the aortic arch. There is no aneurysm, dissection or hemodynamically significant stenosis of the visualized portion of the aorta. Conventional 3 vessel aortic branching pattern. The visualized proximal subclavian arteries are widely patent. RIGHT  CAROTID SYSTEM: Normal without aneurysm, dissection or stenosis. LEFT CAROTID SYSTEM: Normal without aneurysm, dissection or stenosis. VERTEBRAL ARTERIES: Left dominant configuration. Both origins are clearly patent. There is no dissection, occlusion or flow-limiting stenosis to the skull base (V1-V3 segments). CTA HEAD FINDINGS POSTERIOR CIRCULATION: --Vertebral arteries: Normal V4 segments. --Inferior cerebellar arteries: Normal. --Basilar artery: Normal. --Superior cerebellar arteries: Normal. --Posterior cerebral arteries (PCA): Normal. ANTERIOR CIRCULATION: --Intracranial internal carotid arteries: Atherosclerotic calcification of the internal carotid arteries at the skull base without hemodynamically significant stenosis. --Anterior  cerebral arteries (ACA): Normal. Both A1 segments are present. Patent anterior communicating artery (a-comm). --Middle cerebral arteries (MCA): Normal. VENOUS SINUSES: As permitted by contrast timing, patent. ANATOMIC VARIANTS: None Review of the MIP images confirms the above findings. IMPRESSION: 1. No emergent large vessel occlusion or high-grade stenosis of the intracranial or cervical arteries. 2. Aortic Atherosclerosis (ICD10-I70.0). Electronically Signed   By: Ulyses Jarred M.D.   On: 08/30/2021 22:38   CT Head Wo Contrast  Result Date: 08/30/2021 CLINICAL DATA:  Neuro deficit, acute, stroke suspected EXAM: CT HEAD WITHOUT CONTRAST TECHNIQUE: Contiguous axial images were obtained from the base of the skull through the vertex without intravenous contrast. RADIATION DOSE REDUCTION: This exam was performed according to the departmental dose-optimization program which includes automated exposure control, adjustment of the mA and/or kV according to patient size and/or use of iterative reconstruction technique. COMPARISON:  07/27/2012 FINDINGS: Brain: Old right frontal infarct, stable. No acute intracranial abnormality. Specifically, no hemorrhage, hydrocephalus, mass lesion,  acute infarction, or significant intracranial injury. Vascular: No hyperdense vessel or unexpected calcification. Skull: No acute calvarial abnormality. Sinuses/Orbits: No acute findings Other: None IMPRESSION: Old right frontal infarct, stable. No acute intracranial abnormality. Electronically Signed   By: Rolm Baptise M.D.   On: 08/30/2021 21:08      Subjective: No significant events overnight, she reports that pain in right knee has significantly improved, she denies any focal deficits  Discharge Exam: Vitals:   09/03/21 0402 09/03/21 0817  BP: (!) 151/71 126/82  Pulse: 82 66  Resp: 16 16  Temp: 98 F (36.7 C) 97.9 F (36.6 C)  SpO2: 100% 96%   Vitals:   09/02/21 2027 09/03/21 0023 09/03/21 0402 09/03/21 0817  BP: (!) 161/96 (!) 154/78 (!) 151/71 126/82  Pulse: 85 78 82 66  Resp: $Remo'16 18 16 16  'jxPfJ$ Temp: 98.3 F (36.8 C) 98.4 F (36.9 C) 98 F (36.7 C) 97.9 F (36.6 C)  TempSrc: Oral Oral Oral Oral  SpO2: 100% 98% 100% 96%  Weight:      Height:        General: Pt is alert, awake, not in acute distress Cardiovascular: RRR, S1/S2 +, no rubs, no gallops Respiratory: CTA bilaterally, no wheezing, no rhonchi Abdominal: Soft, NT, ND, bowel sounds + Extremities: no edema, no cyanosis    The results of significant diagnostics from this hospitalization (including imaging, microbiology, ancillary and laboratory) are listed below for reference.     Microbiology: No results found for this or any previous visit (from the past 240 hour(s)).   Labs: BNP (last 3 results) No results for input(s): "BNP" in the last 8760 hours. Basic Metabolic Panel: Recent Labs  Lab 08/30/21 2105 08/31/21 0528  NA 142  --   K 4.3  --   CL 108  --   CO2 21*  --   GLUCOSE 152*  --   BUN 33*  --   CREATININE 1.78* 1.60*  CALCIUM 9.0  --    Liver Function Tests: No results for input(s): "AST", "ALT", "ALKPHOS", "BILITOT", "PROT", "ALBUMIN" in the last 168 hours. No results for input(s):  "LIPASE", "AMYLASE" in the last 168 hours. No results for input(s): "AMMONIA" in the last 168 hours. CBC: Recent Labs  Lab 08/30/21 2105 08/31/21 0528  WBC 9.2 7.8  NEUTROABS 6.7  --   HGB 13.6 13.3  HCT 44.2 42.5  MCV 94.6 92.0  PLT 213 225   Cardiac Enzymes: No results for input(s): "CKTOTAL", "CKMB", "CKMBINDEX", "TROPONINI" in the  last 168 hours. BNP: Invalid input(s): "POCBNP" CBG: Recent Labs  Lab 09/02/21 1626 09/02/21 2049 09/03/21 0026 09/03/21 0405 09/03/21 0817  GLUCAP 168* 114* 121* 101* 87   D-Dimer No results for input(s): "DDIMER" in the last 72 hours. Hgb A1c No results for input(s): "HGBA1C" in the last 72 hours. Lipid Profile No results for input(s): "CHOL", "HDL", "LDLCALC", "TRIG", "CHOLHDL", "LDLDIRECT" in the last 72 hours. Thyroid function studies Recent Labs    08/31/21 1532  TSH 2.518   Anemia work up No results for input(s): "VITAMINB12", "FOLATE", "FERRITIN", "TIBC", "IRON", "RETICCTPCT" in the last 72 hours. Urinalysis    Component Value Date/Time   COLORURINE YELLOW 08/31/2021 0727   APPEARANCEUR CLEAR 08/31/2021 0727   LABSPEC 1.020 08/31/2021 0727   PHURINE 5.5 08/31/2021 0727   GLUCOSEU >=500 (A) 08/31/2021 0727   GLUCOSEU NEGATIVE 08/16/2018 1312   HGBUR SMALL (A) 08/31/2021 0727   BILIRUBINUR NEGATIVE 08/31/2021 0727   KETONESUR NEGATIVE 08/31/2021 0727   PROTEINUR NEGATIVE 08/31/2021 0727   UROBILINOGEN 1.0 08/16/2018 1312   NITRITE NEGATIVE 08/31/2021 0727   LEUKOCYTESUR SMALL (A) 08/31/2021 0727   Sepsis Labs Recent Labs  Lab 08/30/21 2105 08/31/21 0528  WBC 9.2 7.8   Microbiology No results found for this or any previous visit (from the past 240 hour(s)).   Time coordinating discharge: Over 30 minutes  SIGNED:   Phillips Climes, MD  [facility] 09/03/2021, 10:22 AM Pager   If 7PM-7AM, please contact night-coverage [email or web address]

## 2021-09-10 ENCOUNTER — Other Ambulatory Visit (HOSPITAL_COMMUNITY): Payer: Self-pay | Admitting: Family Medicine

## 2021-09-10 DIAGNOSIS — I5022 Chronic systolic (congestive) heart failure: Secondary | ICD-10-CM

## 2021-09-17 ENCOUNTER — Encounter (HOSPITAL_COMMUNITY): Payer: Medicare HMO | Admitting: Cardiology

## 2021-09-21 ENCOUNTER — Other Ambulatory Visit: Payer: Self-pay | Admitting: Internal Medicine

## 2021-09-21 DIAGNOSIS — E1142 Type 2 diabetes mellitus with diabetic polyneuropathy: Secondary | ICD-10-CM

## 2021-09-26 ENCOUNTER — Ambulatory Visit: Payer: Medicare HMO | Admitting: Neurology

## 2021-09-26 ENCOUNTER — Encounter: Payer: Self-pay | Admitting: Neurology

## 2021-09-26 VITALS — BP 113/69 | HR 65 | Ht 63.0 in | Wt 179.0 lb

## 2021-09-26 DIAGNOSIS — Z5181 Encounter for therapeutic drug level monitoring: Secondary | ICD-10-CM

## 2021-09-26 DIAGNOSIS — I63521 Cerebral infarction due to unspecified occlusion or stenosis of right anterior cerebral artery: Secondary | ICD-10-CM

## 2021-09-26 DIAGNOSIS — G40909 Epilepsy, unspecified, not intractable, without status epilepticus: Secondary | ICD-10-CM | POA: Diagnosis not present

## 2021-09-26 MED ORDER — LEVETIRACETAM 250 MG PO TABS: 250.0000 mg | ORAL_TABLET | Freq: Two times a day (BID) | ORAL | 1 refills | Status: DC

## 2021-09-26 NOTE — Progress Notes (Signed)
[facility]  PATIENT: [name] DOB: 1945-12-07  REQUESTING CLINICIAN: Elgergawy, Silver Huguenin, MD HISTORY FROM: Patient  REASON FOR VISIT: Hospital follow up for seizure    HISTORICAL  CHIEF COMPLAINT:  Chief Complaint  Patient presents with   RM 13    Here with her great grandchild. She is here for seizure evaluation. She had her first one the week before last. She went to the hospital.      HISTORY OF PRESENT ILLNESS:  This is a 76 year old woman past medical history of hypertension, hyperlipidemia, diabetes mellitus, coronary artery disease, arthritis who is presenting after being admitted to the hospital on June 10 for seizures.  Patient reported that they son called EMS because patient was not acting like her normal self.  She is not sure what was going on but son felt like she was not herself.  She remember being weak, in the hospital.  They were asking a question but she could not answer.  She did have a stroke work-up which was negative for any acute stroke but had a EEG that showed right frontal periodic discharges.  She was given Ativan and loaded on Keppra.  She was also converted to LTM.  The EEG at that time showed paretic discharges with triphasic morphology.  There is also diffuse slowing.  Patient denies any history of seizures, denies any seizure risk factor was then her right frontal stroke.  She states since leaving the hospital she is back to her normal self but then she is feeling sleepy on Keppra 500 mg twice daily.  Denies any seizure activity.  Handedness: Right handed   Onset: June 10  Seizure Type: Unclear  Current frequency:   Any injuries from seizures: None   Seizure risk factors: Granddaughter, Previous right frontal stroke   Previous ASMs: None   Currenty ASMs: Levetiracetam    ASMs side effects: Drowsiness,   Brain Images: Old right frontal infarct, no acute stroke  Previous EEGs: Periodic discharges with triphasic  morphology   OTHER MEDICAL CONDITIONS: Hypertension, hyperlipidemia, diabetes mellitus, previous stroke, coronary artery disease, arthritis    REVIEW OF SYSTEMS: Full 14 system review of systems performed and negative with exception of: as noted in the HPI   ALLERGIES: Allergies  Allergen Reactions   Veltassa [Patiromer] Diarrhea   Darvon Other (See Comments)    indigestion   Promethazine Hcl Other (See Comments)    hyperactivity   Spironolactone Diarrhea and Nausea And Vomiting   Plavix [Clopidogrel Bisulfate] Rash    HOME MEDICATIONS: Outpatient Medications Prior to Visit  Medication Sig Dispense Refill   acetaminophen (TYLENOL) 325 MG tablet Take 2 tablets (650 mg total) by mouth every 4 (four) hours as needed for mild pain or moderate pain.     amiodarone (PACERONE) 100 MG tablet Take 1 tablet (100 mg total) by mouth daily. (Patient taking differently: Take 100 mg by mouth every morning.) 90 tablet 3   Blood Glucose Monitoring Suppl (ACCU-CHEK NANO SMARTVIEW) W/DEVICE KIT Use to test blood sugar 4 times daily as instructed. Dx code: E11.59 1 kit 0   busPIRone (BUSPAR) 5 MG tablet Take 1 tablet (5 mg total) by mouth 2 (two) times daily. 60 tablet 1   carvedilol (COREG) 12.5 MG tablet Take 1 tablet (12.5 mg total) by mouth 2 (two) times daily with a meal. 180 tablet 3   Cholecalciferol (VITAMIN D3) 2000 UNITS TABS Take 2,000 Units by mouth every morning.     dicyclomine (BENTYL)  10 MG capsule TAKE 1 CAPSULE (10 MG TOTAL) BY MOUTH 4 (FOUR) TIMES DAILY BEFORE MEALS AND AT BEDTIME. 120 capsule 1   ELIQUIS 5 MG TABS tablet TAKE 1 TABLET BY MOUTH TWICE A DAY (Patient taking differently: Take 5 mg by mouth 2 (two) times daily.) 60 tablet 11   ENTRESTO 49-51 MG TAKE 1 TABLET BY MOUTH TWICE A DAY (Patient taking differently: Take 1 tablet by mouth 2 (two) times daily.) 60 tablet 6   fluticasone (FLONASE) 50 MCG/ACT nasal spray Place 1 spray into both nostrils daily as needed for allergies  or rhinitis.     furosemide (LASIX) 20 MG tablet Take 1 tablet by mouth daily 90 tablet 1   gabapentin (NEURONTIN) 100 MG capsule TAKE 2 CAPSULES BY MOUTH 2 TIMES DAILY. 360 capsule 0   glucose blood (ACCU-CHEK SMARTVIEW) test strip Use to test blood sugar 4 times daily as instructed. Dx code E11.59 200 each 11   insulin aspart (NOVOLOG FLEXPEN) 100 UNIT/ML FlexPen Inject up to 15 units daily under skin as advised (Patient taking differently: Inject 4-7 Units into the skin 3 (three) times daily before meals. Per sliding scale) 15 mL 11   insulin detemir (LEVEMIR FLEXTOUCH) 100 UNIT/ML FlexPen Inject 20 Units into the skin daily. (Patient taking differently: Inject 20 Units into the skin at bedtime.) 15 mL 11   magnesium oxide (MAG-OX) 400 (240 Mg) MG tablet TAKE 1 TABLET BY MOUTH TWICE A DAY 180 tablet 1   Multiple Vitamins-Minerals (EYE VITAMINS PO) Take 1 tablet by mouth every morning.     nitroGLYCERIN (NITROSTAT) 0.4 MG SL tablet Place 1 tablet (0.4 mg total) under the tongue every 5 (five) minutes as needed for chest pain. 25 tablet 1   omeprazole (PRILOSEC OTC) 20 MG tablet Take 20 mg by mouth every morning.     rosuvastatin (CRESTOR) 40 MG tablet Take 1 tablet (40 mg total) by mouth daily. (Patient taking differently: Take 40 mg by mouth at bedtime.) 100 tablet 3   levETIRAcetam (KEPPRA) 500 MG tablet Take 1 tablet (500 mg total) by mouth 2 (two) times daily. 60 tablet 1   Dulaglutide (TRULICITY) 1.5 SA/6.3KZ SOPN Inject 1.5 mg into the skin once a week. (Patient not taking: Reported on 09/01/2021) 12 mL 3   empagliflozin (JARDIANCE) 10 MG TABS tablet Take 10 mg by mouth every morning. (Patient not taking: Reported on 09/26/2021)     No facility-administered medications prior to visit.    PAST MEDICAL HISTORY: Past Medical History:  Diagnosis Date   Allergy    Arthritis    Atrial fibrillation ([place])    controlled with amiodarone, on coumadin   Chronic renal insufficiency    Chronic  systolic heart failure (HCC)    Congestive heart failure ([place])    Coronary artery disease 01/31/2011   Diabetes mellitus    type 1   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    History of chicken pox    Hyperlipidemia    Hypertension    Ischemic cardiomyopathy    Lumbar spondylosis 01/11/2012   Sleep apnea    Stroke [facility]) 2010   eye doctor said she had TIA   Ventricular tachycardia ([place])    Polymorphic    PAST SURGICAL HISTORY: Past Surgical History:  Procedure Laterality Date   [place]   COLONOSCOPY WITH PROPOFOL N/A 02/24/2018   Procedure: COLONOSCOPY WITH PROPOFOL;  Surgeon:  Milus Banister, MD;  Location: [facility];  Service: Endoscopy;  Laterality: N/A;   ICD  2003/2007   implanted by Dr Rollene Fare, most recent generator change 2/13 by Dr Rayann Heman, Analyze ST study patient   ICD GENERATOR CHANGEOUT N/A 04/11/2020   Procedure: [place];  Surgeon: Thompson Grayer, MD;  Location: [facility];  Service: Cardiovascular;  Laterality: N/A;   IMPLANTABLE CARDIOVERTER DEFIBRILLATOR (ICD) GENERATOR CHANGE N/A 05/05/2011   Procedure: ICD GENERATOR CHANGE;  Surgeon: Thompson Grayer, MD;  Location: [facility] CATH LAB;  Service: Cardiovascular;  Laterality: N/A;   LEFT HEART CATHETERIZATION WITH CORONARY ANGIOGRAM N/A 01/30/2011   Procedure: LEFT HEART CATHETERIZATION WITH CORONARY ANGIOGRAM;  Surgeon: Larey Dresser, MD;  Location: [facility] CATH LAB;  Service: Cardiovascular;  Laterality: N/A;   POLYPECTOMY  02/24/2018   Procedure: POLYPECTOMY;  Surgeon: Milus Banister, MD;  Location: [facility] ENDOSCOPY;  Service: Endoscopy;;   TUBALIGATION  1980    FAMILY HISTORY: Family History  Problem Relation Age of Onset   Diabetes Mother    Heart disease Mother    Hyperlipidemia Mother    Hypertension Mother    Heart disease Father    Heart attack Father    Hypertension Father    Breast cancer Sister     Lung cancer Sister    Irritable bowel syndrome Sister    Early death Brother 66   Colon cancer Maternal Aunt    Seizures Granddaughter    Esophageal cancer Neg Hx    Colon polyps Neg Hx     SOCIAL HISTORY: Social History   Socioeconomic History   Marital status: Divorced    Spouse name: Not on file   Number of children: 3   Years of education: 12   Highest education level: Not on file  Occupational History   Occupation: Retried  Tobacco Use   Smoking status: Former    Types: Cigarettes    Quit date: 08/16/1995    Years since quitting: 26.1   Smokeless tobacco: Never  Vaping Use   Vaping Use: Never used  Substance and Sexual Activity   Alcohol use: No   Drug use: No   Sexual activity: Never    Birth control/protection: Post-menopausal  Other Topics Concern   Not on file  Social History Narrative   Regular exercise-no Caffeine Use- sometimes   Lives with family   Social Determinants of Health   Financial Resource Strain: Not on file  Food Insecurity: Not on file  Transportation Needs: Not on file  Physical Activity: Not on file  Stress: Not on file  Social Connections: Not on file  Intimate Partner Violence: Not on file    PHYSICAL EXAM  GENERAL EXAM/CONSTITUTIONAL: Vitals:  Vitals:   09/26/21 1146  BP: 113/69  Pulse: 65  Weight: 179 lb (81.2 kg)  Height: _0  (1.6 m)   Body mass index is 31.71 kg/m. Wt Readings from Last 3 Encounters:  09/26/21 179 lb (81.2 kg)  08/31/21 178 lb (80.7 kg)  07/08/21 178 lb 6.4 oz (80.9 kg)   Patient is in no distress; well developed, nourished and groomed; neck is supple  EYES: Pupils round and reactive to light, Visual fields full to confrontation, Extraocular movements intacts,  No results found.  MUSCULOSKELETAL: Gait, strength, tone, movements noted in Neurologic exam below  NEUROLOGIC: MENTAL STATUS:      No data to display         awake, alert, oriented to person, place and time recent  and  remote memory intact normal attention and concentration language fluent, comprehension intact, naming intact fund of knowledge appropriate  CRANIAL NERVE:  2nd, 3rd, 4th, 6th - pupils equal and reactive to light, visual fields full to confrontation, extraocular muscles intact, no nystagmus 5th - facial sensation symmetric 7th - facial strength symmetric 8th - hearing intact 9th - palate elevates symmetrically, uvula midline 11th - shoulder shrug symmetric 12th - tongue protrusion midline  MOTOR:  normal bulk and tone, full strength in the BUE, BLE  SENSORY:  normal and symmetric to light touch, pinprick, temperature, vibration  COORDINATION:  finger-nose-finger, fine finger movements normal  REFLEXES:  deep tendon reflexes present and symmetric  GAIT/STATION:  normal    DIAGNOSTIC DATA (LABS, IMAGING, TESTING) - I reviewed patient records, labs, notes, testing and imaging myself where available.  Lab Results  Component Value Date   WBC 7.8 08/31/2021   HGB 13.3 08/31/2021   HCT 42.5 08/31/2021   MCV 92.0 08/31/2021   PLT 225 08/31/2021      Component Value Date/Time   NA 142 08/30/2021 2105   NA 141 06/27/2021 1137   K 4.3 08/30/2021 2105   CL 108 08/30/2021 2105   CO2 21 (L) 08/30/2021 2105   GLUCOSE 152 (H) 08/30/2021 2105   BUN 33 (H) 08/30/2021 2105   BUN 28 (H) 06/27/2021 1137   CREATININE 1.60 (H) 08/31/2021 0528   CREATININE 1.76 (H) 05/06/2015 1247   CALCIUM 9.0 08/30/2021 2105   PROT 6.8 06/27/2021 1137   ALBUMIN 4.0 06/27/2021 1137   AST 20 06/27/2021 1137   ALT 11 06/27/2021 1137   ALKPHOS 78 06/27/2021 1137   BILITOT 0.7 06/27/2021 1137   GFRNONAA 34 (L) 08/31/2021 0528   GFRAA 42 (L) 04/01/2020 1204   Lab Results  Component Value Date   CHOL 159 08/31/2021   HDL 64 08/31/2021   LDLCALC 81 08/31/2021   TRIG 69 08/31/2021   Lab Results  Component Value Date   HGBA1C 6.3 (A) 07/08/2021   No results found for: "VITAMINB12" Lab  Results  Component Value Date   TSH 2.518 08/31/2021   Head CT 08/30/21 Old right frontal infarct, stable. No acute intracranial abnormality.  EEG 08/31/2021 - Periodic discharges with triphasic morphology, generalized and lateralized right hemisphere ( GPDs) - Continuous slow, generalized   IMPRESSION: This study showed periodic discharges at 1.5-_0  which are on the ictal-interictal continuum. The frequency and morphology is more commonly indicative of toxic-metabolic causes. However, the predominance in right hemipshere In the setting of underlying chronic infarct could be suggestive of potential epileptogenicity. Consider ativan challenge and prolonged monitoring for further characterization.    Additionally, this study is suggestive of moderate diffuse encephalopathy, nonspecific etiology. No definite seizures were seen throughout the recording.  I personally reviewed brain Images and previous EEG reports.   ASSESSMENT AND PLAN  75 y.o. year old female  with vascular risk factors including hypertension, hyperlipidemia, CAD, diabetes mellitus who is presenting after being discharged from the hospital for seizures and postictal state.  Patient is currently on Keppra 500 mg twice daily, report drowsiness and somnolence as side effect.  Otherwise denies any additional seizures or seizure-like activity.  This was the first reported seizure or seizure like activity.  At this time I will decrease the Keppra to 250 mg twice daily, and continue to monitor her symptoms.  If she does have a breakthrough seizure we will increase the dose or switch to a different antiseizure medication.  I will check a Keppra level today.  I will see her in 6 months for follow-up. She does not drive.    1. Seizure disorder ([place])   2. Cerebrovascular accident (CVA) due to occlusion of right anterior cerebral artery ([place])   3. Therapeutic drug monitoring     Patient Instructions  Decrease Keppra to 250 mg (1/2  Tablet) twice daily  Continue your other medications  Will check a Keppra level today  Follow up in 6 months    Per [facility] statutes, patients with seizures are not allowed to drive until they have been seizure-free for six months.  Other recommendations include using caution when using heavy equipment or power tools. Avoid working on ladders or at heights. Take showers instead of baths.  Do not swim alone.  Ensure the water temperature is not too high on the home water heater. Do not go swimming alone. Do not lock yourself in a room alone (i.e. bathroom). When caring for infants or small children, sit down when holding, feeding, or changing them to minimize risk of injury to the child in the event you have a seizure. Maintain good sleep hygiene. Avoid alcohol.  Also recommend adequate sleep, hydration, good diet and minimize stress.   During the Seizure  - First, ensure adequate ventilation and place patients on the floor on their left side  Loosen clothing around the neck and ensure the airway is patent. If the patient is clenching the teeth, do not force the mouth open with any object as this can cause severe damage - Remove all items from the surrounding that can be hazardous. The patient may be oblivious to what's happening and may not even know what he or she is doing. If the patient is confused and wandering, either gently guide him/her away and block access to outside areas - Reassure the individual and be comforting - Call 911. In most cases, the seizure ends before EMS arrives. However, there are cases when seizures may last over 3 to 5 minutes. Or the individual may have developed breathing difficulties or severe injuries. If a pregnant patient or a person with diabetes develops a seizure, it is prudent to call an ambulance. - Finally, if the patient does not regain full consciousness, then call EMS. Most patients will remain confused for about 45 to 90 minutes after a seizure,  so you must use judgment in calling for help. - Avoid restraints but make sure the patient is in a bed with padded side rails - Place the individual in a lateral position with the neck slightly flexed; this will help the saliva drain from the mouth and prevent the tongue from falling backward - Remove all nearby furniture and other hazards from the area - Provide verbal assurance as the individual is regaining consciousness - Provide the patient with privacy if possible - Call for help and start treatment as ordered by the caregiver   After the Seizure (Postictal Stage)  After a seizure, most patients experience confusion, fatigue, muscle pain and/or a headache. Thus, one should permit the individual to sleep. For the next few days, reassurance is essential. Being calm and helping reorient the person is also of importance.  Most seizures are painless and end spontaneously. Seizures are not harmful to others but can lead to complications such as stress on the lungs, brain and the heart. Individuals with prior lung problems may develop labored breathing and respiratory distress.     Orders Placed This Encounter  Procedures   Levetiracetam level    Meds ordered this encounter  Medications   levETIRAcetam (KEPPRA) 250 MG tablet    Sig: Take 1 tablet (250 mg total) by mouth 2 (two) times daily.    Dispense:  180 tablet    Refill:  1    Return in about 6 months (around 03/29/2022).    Alric Ran, MD 09/26/2021, 12:32 PM  [facility] [address] [place], [place] [address] [phone]

## 2021-09-26 NOTE — Patient Instructions (Addendum)
Decrease Keppra to 250 mg (1/2 Tablet) twice daily  Continue your other medications  Will check a Keppra level today  Follow up in 6 months

## 2021-09-28 LAB — LEVETIRACETAM LEVEL: Levetiracetam Lvl: 46.3 ug/mL — ABNORMAL HIGH (ref 10.0–40.0)

## 2021-10-07 ENCOUNTER — Other Ambulatory Visit (HOSPITAL_COMMUNITY): Payer: Self-pay | Admitting: Cardiology

## 2021-10-07 ENCOUNTER — Other Ambulatory Visit: Payer: Self-pay | Admitting: Internal Medicine

## 2021-10-15 ENCOUNTER — Ambulatory Visit (INDEPENDENT_AMBULATORY_CARE_PROVIDER_SITE_OTHER): Payer: Medicare HMO

## 2021-10-15 DIAGNOSIS — I255 Ischemic cardiomyopathy: Secondary | ICD-10-CM

## 2021-10-15 LAB — CUP PACEART REMOTE DEVICE CHECK
Battery Remaining Longevity: 74 mo
Battery Remaining Percentage: 83 %
Battery Voltage: 3.02 V
Brady Statistic AP VP Percent: 6.4 %
Brady Statistic AP VS Percent: 73 %
Brady Statistic AS VP Percent: 1.9 %
Brady Statistic AS VS Percent: 13 %
Brady Statistic RA Percent Paced: 72 %
Brady Statistic RV Percent Paced: 9.2 %
Date Time Interrogation Session: 20230726020027
HighPow Impedance: 47 Ohm
HighPow Impedance: 47 Ohm
Implantable Lead Implant Date: 20070117
Implantable Lead Implant Date: 20070117
Implantable Lead Location: 753859
Implantable Lead Location: 753860
Implantable Lead Model: 7040
Implantable Pulse Generator Implant Date: 20220120
Lead Channel Impedance Value: 430 Ohm
Lead Channel Impedance Value: 490 Ohm
Lead Channel Pacing Threshold Amplitude: 0.75 V
Lead Channel Pacing Threshold Amplitude: 0.75 V
Lead Channel Pacing Threshold Pulse Width: 0.5 ms
Lead Channel Pacing Threshold Pulse Width: 0.5 ms
Lead Channel Sensing Intrinsic Amplitude: 11.8 mV
Lead Channel Sensing Intrinsic Amplitude: 3.7 mV
Lead Channel Setting Pacing Amplitude: 2.5 V
Lead Channel Setting Pacing Amplitude: 2.5 V
Lead Channel Setting Pacing Pulse Width: 0.5 ms
Lead Channel Setting Sensing Sensitivity: 0.5 mV
Pulse Gen Serial Number: 9976817

## 2021-10-31 ENCOUNTER — Other Ambulatory Visit (HOSPITAL_COMMUNITY): Payer: Self-pay | Admitting: Cardiology

## 2021-11-10 NOTE — Progress Notes (Signed)
Remote ICD transmission.   

## 2021-11-11 ENCOUNTER — Ambulatory Visit (INDEPENDENT_AMBULATORY_CARE_PROVIDER_SITE_OTHER): Payer: Medicare HMO | Admitting: Internal Medicine

## 2021-11-11 ENCOUNTER — Encounter: Payer: Self-pay | Admitting: Internal Medicine

## 2021-11-11 VITALS — BP 128/84 | HR 72 | Ht 63.0 in | Wt 185.2 lb

## 2021-11-11 DIAGNOSIS — E1165 Type 2 diabetes mellitus with hyperglycemia: Secondary | ICD-10-CM | POA: Diagnosis not present

## 2021-11-11 DIAGNOSIS — E1159 Type 2 diabetes mellitus with other circulatory complications: Secondary | ICD-10-CM

## 2021-11-11 LAB — POCT GLYCOSYLATED HEMOGLOBIN (HGB A1C): Hemoglobin A1C: 7.1 % — AB (ref 4.0–5.6)

## 2021-11-11 MED ORDER — TRULICITY 1.5 MG/0.5ML ~~LOC~~ SOAJ
1.5000 mg | SUBCUTANEOUS | 3 refills | Status: DC
Start: 2021-11-11 — End: 2022-01-23

## 2021-11-11 MED ORDER — EMPAGLIFLOZIN 10 MG PO TABS: 10.0000 mg | ORAL_TABLET | Freq: Every morning | ORAL | 3 refills | Status: DC

## 2021-11-11 NOTE — Progress Notes (Signed)
Patient ID: [name], female   DOB: 1945-05-06, 76 y.o.   MRN: [id number]  HPI: [name] is a 76 y.o.-year-old female, presenting for f/u for DM2, insulin-dependent, uncontrolled, with complications (CAD, ICM - had ICD, peripheral neuropathy, gastroparesis). Last visit 4 months ago.  Interim history: No increased urination, blurry vision, nausea, chest pain. She has knee pain. She cannot afford Trulicity and Jardiance as she is in the donut hole.  However, she would like to try again at the pharmacy to see if she is now out of the donut hole.  Reviewed HbA1c levels: Lab Results  Component Value Date   HGBA1C 6.3 (A) 07/08/2021   HGBA1C 6.7 (A) 04/02/2021   HGBA1C 7.7 (A) 11/23/2018  11/08/2020: HbA1c 8.5% 09/25/2020: HbA1c 10.3% 05/13/2020: HbA1c 9.6% 01/22/2020: HbA1c 8% 03/13/2019: HbA1c 7.1% 09/26/2020: Glucose 197, C-peptide 7.76 (1.1-4.4) Prev. on: - Metformin XR 1000 mg with dinner - Levemir 35 units at bedtime - Novolog 10-16 units before meals (use the higher dose after a steroid inj)  She was previously on Farxiga but stopped due to side effects (somnolence?)  Now on: - Jardiance 10 mg before breakfast - tolerated well >> 500$ for 1 mo in doughnut hole (out for >1 mo) - Levemir 25 >> 20 units at bedtime - Novolog 7 >> 5 units before meals >> only before larger meals - Trulicity 5.62 >> 1.5 mg weekly in a.m. - no nausea >> 800$ for 1.5 mo in doughnut hole (out for >1 mo) Metformin was stopped at the recommendation of her nephrologist in 2022.  Pt checks her sugars 3-4 times a day: - am:  84-140, 156, 187 >> 96-114, 124 >> 96, 105-145, 164 - after b'fast: 175 >> n/c - before lunch: 85-138, 142, 191 >> 102-125 >> 110-180 - after lunch:  45, 47, 140-150 >> n/c - before dinner: 71, 130-410 >> 88-135 >> 63, 96-120 >> 97, 130-182 - bedtime:  110-360 >> 91-185, 216, 221 >> 108-123 >> 117-210, 325 Lowest sugar was 86 >> 58 >> 50s at night >> 63;  she has  hypoglycemia awareness in the 90s. Highest sugar was 310 >> 515 (steroid) >> 221 >> 325.  -+ CKD, last BUN/creatinine:   Lab Results  Component Value Date   BUN 33 (H) 08/30/2021   CREATININE 1.60 (H) 08/31/2021   GFR low -she sees nephrology: Lab Results  Component Value Date   GFRNONAA 34 (L) 08/31/2021   GFRNONAA 30 (L) 08/30/2021   GFRNONAA 24 (L) 06/10/2021   GFRNONAA 27 (L) 05/20/2021   GFRNONAA 40 (L) 01/14/2021   ACR was high at last check: 09/26/2020: ACR 55.3 Lab Results  Component Value Date   MICRALBCREAT 5.3 08/16/2018   MICRALBCREAT 9.4 04/08/2017   MICRALBCREAT 3.2 06/25/2016   MICRALBCREAT 1.6 12/21/2014   MICRALBCREAT 1.0 12/15/2012  On Entresto.  -+ HL; last set of lipids: Lab Results  Component Value Date   CHOL 159 08/31/2021   HDL 64 08/31/2021   LDLCALC 81 08/31/2021   TRIG 69 08/31/2021   CHOLHDL 2.5 08/31/2021  On Crestor 20 >> 40.  - last eye exam was in 2018: No DR  - she has numbness and tingling in her feet-on Neurontin.  She was referred to a pain clinic by her PCP.  She is on Eliquis. On amiodarone.  She has a history of mild hypothyroidism; latest TSH was slightly low: Lab Results  Component Value Date   TSH 2.518 08/31/2021   She was on  levothyroxine 25 mcg daily, but currently off.  She has no family history of medullary thyroid cancer or personal history of pancreatitis. Her 19 y/o son passed away in 29-Jun-2018 - had cirrhosis >> developed cough, fever, generalized pain+ bilateral PNA (Covid 19 negative).   Her nephew died in 2018/07/30 and her brother died November 29, 2018.    She saw endocrinology at [facility], with the last office visit in 09/2020.  Her diabetic regimen was changed at that time (Trulicity and Jardiance added).  Sugars improved.  ROS: + see HPI Neurological: no tremors/+ numbness/+ tingling/no dizziness  I reviewed pt's medications, allergies, PMH, social hx, family hx, and changes were documented in the  history of present illness. Otherwise, unchanged from my initial visit note.  Past Medical History:  Diagnosis Date   Allergy    Arthritis    Atrial fibrillation ([place])    controlled with amiodarone, on coumadin   Chronic renal insufficiency    Chronic systolic heart failure (HCC)    Congestive heart failure ([place])    Coronary artery disease 01/31/2011   Diabetes mellitus    type 1   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    History of chicken pox    Hyperlipidemia    Hypertension    Ischemic cardiomyopathy    Lumbar spondylosis 01/11/2012   Sleep apnea    Stroke [facility]) 2010   eye doctor said she had TIA   Ventricular tachycardia ([place])    Polymorphic   Past Surgical History:  Procedure Laterality Date   [place]   COLONOSCOPY WITH PROPOFOL N/A 02/24/2018   Procedure: COLONOSCOPY WITH PROPOFOL;  Surgeon: Milus Banister, MD;  Location: [facility] ENDOSCOPY;  Service: Endoscopy;  Laterality: N/A;   ICD  2008-Apr-2007   implanted by Dr Rollene Fare, most recent generator change 2/13 by Dr Rayann Heman, Analyze ST study patient   ICD GENERATOR CHANGEOUT N/A 04/11/2020   Procedure: [place];  Surgeon: Thompson Grayer, MD;  Location: [place] CV LAB;  Service: Cardiovascular;  Laterality: N/A;   IMPLANTABLE CARDIOVERTER DEFIBRILLATOR (ICD) GENERATOR CHANGE N/A 05/05/2011   Procedure: ICD GENERATOR CHANGE;  Surgeon: Thompson Grayer, MD;  Location: [facility] C[facility] LAB;  Service: Cardiovascular;  Laterality: N/A;   LEFT HEART CATHETERIZATION WITH CORONARY ANGIOGRAM N/A 01/30/2011   Procedure: LEFT HEART CATHETERIZATION WITH CORONARY ANGIOGRAM;  Surgeon: Larey Dresser, MD;  Location: [facility] LAB;  Service: Cardiovascular;  Laterality: N/A;   POLYPECTOMY  02/24/2018   Procedure: POLYPECTOMY;  Surgeon: Milus Banister, MD;  Location: [facility] ENDOS[facility];  Service: Endoscopy;;   [place] History    Socioeconomic History   Marital status: Divorced    Spouse name: Not on file   Number of children: 3   Years of education: 12   Highest education level: Not on file  Occupational History   Occupation: Retried  Tobacco Use   Smoking status: Former    Types: Cigarettes    Quit date: 08/16/1995    Years since quitting: 26.2   Smokeless tobacco: Never  Vaping Use   Vaping Use: Never used  Substance and Sexual Activity   Alcohol use: No   Drug use: No   Sexual activity: Never    Birth control/protection: Post-menopausal  Other Topics Concern   Not on file  Social History Narrative   Regular exercise-no Caffeine Use- sometimes   Lives with family  Social Determinants of Health   Financial Resource Strain: Not on file  Food Insecurity: Not on file  Transportation Needs: Not on file  Physical Activity: Not on file  Stress: Not on file  Social Connections: Not on file  Intimate Partner Violence: Not on file   Current Outpatient Medications on File Prior to Visit  Medication Sig Dispense Refill   acetaminophen (TYLENOL) 325 MG tablet Take 2 tablets (650 mg total) by mouth every 4 (four) hours as needed for mild pain or moderate pain.     amiodarone (PACERONE) 100 MG tablet Take 1 tablet (100 mg total) by mouth daily. (Patient taking differently: Take 100 mg by mouth every morning.) 90 tablet 3   Blood Glucose Monitoring Suppl (ACCU-CHEK NANO SMARTVIEW) W/DEVICE KIT Use to test blood sugar 4 times daily as instructed. Dx code: E11.59 1 kit 0   busPIRone (BUSPAR) 5 MG tablet Take 1 tablet (5 mg total) by mouth 2 (two) times daily. 60 tablet 1   carvedilol (COREG) 12.5 MG tablet TAKE 1 TABLET (12.5 MG TOTAL) BY MOUTH 2 (TWO) TIMES DAILY WITH A MEAL. 180 tablet 3   Cholecalciferol (VITAMIN D3) 2000 UNITS TABS Take 2,000 Units by mouth every morning.     dicyclomine (BENTYL) 10 MG capsule TAKE 1 CAPSULE (10 MG TOTAL) BY MOUTH 4 (FOUR) TIMES DAILY BEFORE MEALS AND AT BEDTIME. 120  capsule 1   Dulaglutide (TRULICITY) 1.5 ZO/1.0RU SOPN Inject 1.5 mg into the skin once a week. (Patient not taking: Reported on 09/01/2021) 12 mL 3   ELIQUIS 5 MG TABS tablet TAKE 1 TABLET BY MOUTH TWICE A DAY (Patient taking differently: Take 5 mg by mouth 2 (two) times daily.) 60 tablet 11   empagliflozin (JARDIANCE) 10 MG TABS tablet Take 10 mg by mouth every morning. (Patient not taking: Reported on 09/26/2021)     ENTRESTO 49-51 MG TAKE 1 TABLET BY MOUTH TWICE A DAY (Patient taking differently: Take 1 tablet by mouth 2 (two) times daily.) 60 tablet 6   fluticasone (FLONASE) 50 MCG/ACT nasal spray Place 1 spray into both nostrils daily as needed for allergies or rhinitis.     furosemide (LASIX) 20 MG tablet Take 1 tablet by mouth daily 90 tablet 1   gabapentin (NEURONTIN) 100 MG capsule TAKE 2 CAPSULES BY MOUTH 2 TIMES DAILY. 360 capsule 0   glucose blood (ACCU-CHEK SMARTVIEW) test strip Use to test blood sugar 4 times daily as instructed. Dx code E11.59 200 each 11   insulin aspart (NOVOLOG FLEXPEN) 100 UNIT/ML FlexPen Inject up to 15 units daily under skin as advised (Patient taking differently: Inject 4-7 Units into the skin 3 (three) times daily before meals. Per sliding scale) 15 mL 11   insulin detemir (LEVEMIR FLEXTOUCH) 100 UNIT/ML FlexPen Inject 20 Units into the skin daily. (Patient taking differently: Inject 20 Units into the skin at bedtime.) 15 mL 11   levETIRAcetam (KEPPRA) 250 MG tablet Take 1 tablet (250 mg total) by mouth 2 (two) times daily. 180 tablet 1   magnesium oxide (MAG-OX) 400 (240 Mg) MG tablet TAKE 1 TABLET BY MOUTH TWICE A DAY 180 tablet 1   Multiple Vitamins-Minerals (EYE VITAMINS PO) Take 1 tablet by mouth every morning.     nitroGLYCERIN (NITROSTAT) 0.4 MG SL tablet Place 1 tablet (0.4 mg total) under the tongue every 5 (five) minutes as needed for chest pain. 25 tablet 1   omeprazole (PRILOSEC OTC) 20 MG tablet Take 20 mg by mouth every morning.  rosuvastatin  (CRESTOR) 40 MG tablet TAKE 1 TABLET BY MOUTH EVERY DAY 90 tablet 3   No current facility-administered medications on file prior to visit.   Allergies  Allergen Reactions   Veltassa [Patiromer] Diarrhea   Darvon Other (See Comments)    indigestion   Promethazine Hcl Other (See Comments)    hyperactivity   Spironolactone Diarrhea and Nausea And Vomiting   Plavix [Clopidogrel Bisulfate] Rash   Family History  Problem Relation Age of Onset   Diabetes Mother    Heart disease Mother    Hyperlipidemia Mother    Hypertension Mother    Heart disease Father    Heart attack Father    Hypertension Father    Breast cancer Sister    Lung cancer Sister    Irritable bowel syndrome Sister    Early death Brother 14   Colon cancer Maternal Aunt    Seizures Granddaughter    Esophageal cancer Neg Hx    Colon polyps Neg Hx    PE: BP 128/84 (BP Location: Right Arm, Patient Position: Sitting, Cuff Size: Normal)   Pulse 72   Ht _0  (1.6 m)   Wt 185 lb 3.2 oz (84 kg)   SpO2 92%   BMI 32.81 kg/m    Wt Readings from Last 3 Encounters:  11/11/21 185 lb 3.2 oz (84 kg)  09/26/21 179 lb (81.2 kg)  08/31/21 178 lb (80.7 kg)   Constitutional: overweight, in NAD Eyes: EOMI, no exophthalmos ENT: moist mucous membranes, no thyromegaly, no cervical lymphadenopathy Cardiovascular: RRR, No MRG Respiratory: CTA B Musculoskeletal: no deformities Skin: moist, warm, no rashes Neurological: no tremor with outstretched hands  ASSESSMENT: 1. DM2, insulin-dependent, controlled, with complications - CAD - s/p stent - ICM with CHF - s/p ICD placement - A fib - s/p pacemaker - CKD - PN - on neurontin - gastroparesis per GES 06/18/2012 >> 60 minutes: 92%, 120 minutes: 82%  2. HL  3.  History of hypothyroidism  PLAN:  1. Patient with longstanding, uncontrolled, type 2 diabetes, on basal-bolus regimen for now, and previously also on SGLT2 inhibitor and GLP-1 receptor agonist, but now not affordable  due to being in the donut hole.  She has been off the latter 2 medications for almost a month.  She feels that her sugars have increased afterwards.  At last visit, her HbA1c was excellent, at 6.3%, as she was on the above medications.  At today's visit, this is increased (see below). -At this visit, we gave her paperwork for patient assistance for Jardiance and Ozempic [employer] does not offer this for Trulicity) but also sent the prescriptions to her pharmacy to see if she can obtain them.  In the meantime, as she did have some low blood sugars at night in the last 3 nights, in the 50s, I advised her to reduce the dose of her Levemir. - I suggested to: Patient Instructions  Please try to restart: - Jardiance 10 mg before breakfast - Trulicity 1.5 mg weekly in a.m.  Continue: - Novolog 5 units before large meals only  Decrease: - Levemir 16 units at bedtime  Please return in 4 months with your sugar log.   - we checked her HbA1c: 7.1% (higher) - advised to check sugars at different times of the day - 4x a day, rotating check times - advised for yearly eye exams >> she is not UTD - return to clinic in 4 months  2. HL - Reviewed latest lipid panel from  08/2021: LDL above our target of less than 55 due to history of cardiovascular disease, the rest of the fractions at goal: Lab Results  Component Value Date   CHOL 159 08/31/2021   HDL 64 08/31/2021   LDLCALC 81 08/31/2021   TRIG 69 08/31/2021   CHOLHDL 2.5 08/31/2021  -She continues on Crestor 40 mg daily without side effects  3.  History of hypothyroidism -Possibly related to amiodarone -She was deviously on a low-dose levothyroxine, 25 mcg daily, but the TSH returned suppressed in 12/2020, so we stopped the thyroid hormone.  Subsequent TFTs were normal -Reviewed latest TSH and this remains normal 2 months ago: Lab Results  Component Value Date   TSH 2.518 08/31/2021  -No intervention needed for now  Philemon Kingdom, MD  PhD [facility] Endocrinology

## 2021-11-11 NOTE — Patient Instructions (Signed)
Please try to restart: - Jardiance 10 mg before breakfast - Trulicity 1.5 mg weekly in a.m.  Continue: - Novolog 5 units before large meals only  Decrease: - Levemir 16 units at bedtime  Please return in 4 months with your sugar log.

## 2021-11-13 NOTE — Progress Notes (Deleted)
Electrophysiology Office Follow up Visit Note:    Date:  11/13/2021   ID:  [name], DOB 07-24-1945, MRN [id number]  PCP:  Merryl Hacker, No  [facility] Cardiologist:  Loralie Champagne, MD  [facility] Electrophysiologist:  Vickie Epley, MD    Interval History:    [name] is a 76 y.o. female who presents for a follow up visit.  The patient was previously followed by Dr. Rayann Heman and last saw a Renee in clinic June 27, 2021.  The patient has a history of coronary artery disease post PCI, ischemic cardiomyopathy, chronic systolic heart failure, VT, ICD in situ, atrial fibrillation, stroke, diabetes, hypertension, hyperlipidemia, CKD 3 and hypothyroidism.  Device information SJM dual chamber ICD, implanted 2003,  2007, gen changes 2013, Jan 2022 2003 had syncope >> EP study with inducible PMVT + h/o PMVT 2012 with ICD shocks 04/24/21 appropriate tx for VT w/HV tx   At the time of her gen change Jan 2022,  SJM Riata ST 7040 lead was fluoroscopically and electrically normal today, opted to use this lead rather than replacing it after a long discussion with the patient prior to the procedure.     Past Medical History:  Diagnosis Date   Allergy    Arthritis    Atrial fibrillation ([place])    controlled with amiodarone, on coumadin   Chronic renal insufficiency    Chronic systolic heart failure (HCC)    Congestive heart failure ([place])    Coronary artery disease 01/31/2011   Diabetes mellitus    type 1   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    History of chicken pox    Hyperlipidemia    Hypertension    Ischemic cardiomyopathy    Lumbar spondylosis 01/11/2012   Sleep apnea    Stroke [facility]) 2010   eye doctor said she had TIA   Ventricular tachycardia ([place])    Polymorphic    Past Surgical History:  Procedure Laterality Date   [place]   COLONOSCOPY WITH PROPOFOL N/A  02/24/2018   Procedure: COLONOSCOPY WITH PROPOFOL;  Surgeon: Milus Banister, MD;  [facility];  Service: Endoscopy;  Laterality: N/A;   ICD  2003/2007   implanted by Dr Rollene Fare, most recent generator change 2/13 by Dr Rayann Heman, Analyze ST study patient   ICD GENERATOR CHANGEOUT N/A 04/11/2020   Procedure: [place];  Surgeon: Thompson Grayer, MD;  Location: [place] CV LAB;  Service: Cardiovascular;  Laterality: N/A;   IMPLANTABLE CARDIOVERTER DEFIBRILLATOR (ICD) GENERATOR CHANGE N/A 05/05/2011   Procedure: ICD GENERATOR CHANGE;  Surgeon: Thompson Grayer, MD;  Location: [facility] CATH LAB;  Service: Cardiovascular;  Laterality: N/A;   LEFT HEART CATHETERIZATION WITH CORONARY ANGIOGRAM N/A 01/30/2011   Procedure: LEFT HEART CATHETERIZATION WITH CORONARY ANGIOGRAM;  Surgeon: Larey Dresser, MD;  Location: [facility] CATH LAB;  Service: Cardiovascular;  Laterality: N/A;   POLYPECTOMY  02/24/2018   Procedure: POLYPECTOMY;  Surgeon: Milus Banister, MD;  [facility];  Service: Endoscopy;;   TUBALIGATION  1980    Current Medications: No outpatient medications have been marked as taking for the 11/14/21 encounter (Appointment) with Vickie Epley, MD.     Allergies:   Veltassa [patiromer], Darvon, Promethazine hcl, Spironolactone, and Plavix [clopidogrel bisulfate]   Social History   Socioeconomic History   Marital status: Divorced    Spouse name: Not on file   Number of  children: 3   Years of education: 12   Highest education level: Not on file  Occupational History   Occupation: Retried  Tobacco Use   Smoking status: Former    Types: Cigarettes    Quit date: 08/16/1995    Years since quitting: 26.2   Smokeless tobacco: Never  Vaping Use   Vaping Use: Never used  Substance and Sexual Activity   Alcohol use: No   Drug use: No   Sexual activity: Never    Birth control/protection: Post-menopausal  Other Topics Concern   Not on file  Social History Narrative    Regular exercise-no Caffeine Use- sometimes   Lives with family   Social Determinants of Health   Financial Resource Strain: Not on file  Food Insecurity: Not on file  Transportation Needs: Not on file  Physical Activity: Not on file  Stress: Not on file  Social Connections: Not on file     Family History: The patient's family history includes Breast cancer in her sister; Colon cancer in her maternal aunt; Diabetes in her mother; Early death (age of onset: 4) in her brother; Heart attack in her father; Heart disease in her father and mother; Hyperlipidemia in her mother; Hypertension in her father and mother; Irritable bowel syndrome in her sister; Lung cancer in her sister; Seizures in her granddaughter. There is no history of Esophageal cancer or Colon polyps.  ROS:   Please see the history of present illness.    All other systems reviewed and are negative.  EKGs/Labs/Other Studies Reviewed:    The following studies were reviewed today:  November 14, 2021 ICD interrogation in clinic personally reviewed ***  EKG:  The ekg ordered today demonstrates ***  Recent Labs: 05/20/2021: Magnesium 2.4 06/27/2021: ALT 11 08/30/2021: BUN 33; Potassium 4.3; Sodium 142 08/31/2021: Creatinine, Ser 1.60; Hemoglobin 13.3; Platelets 225; TSH 2.518  Recent Lipid Panel    Component Value Date/Time   CHOL 159 08/31/2021 0528   TRIG 69 08/31/2021 0528   HDL 64 08/31/2021 0528   CHOLHDL 2.5 08/31/2021 0528   VLDL 14 08/31/2021 0528   LDLCALC 81 08/31/2021 0528    Physical Exam:    VS:  There were no vitals taken for this visit.    Wt Readings from Last 3 Encounters:  11/11/21 185 lb 3.2 oz (84 kg)  09/26/21 179 lb (81.2 kg)  08/31/21 178 lb (80.7 kg)     GEN: *** Well nourished, well developed in no acute distress HEENT: Normal NECK: No JVD; No carotid bruits LYMPHATICS: No lymphadenopathy CARDIAC: ***RRR, no murmurs, rubs, gallops RESPIRATORY:  Clear to auscultation without rales,  wheezing or rhonchi  ABDOMEN: Soft, non-tender, non-distended MUSCULOSKELETAL:  No edema; No deformity  SKIN: Warm and dry NEUROLOGIC:  Alert and oriented x 3 PSYCHIATRIC:  Normal affect        ASSESSMENT:    1. PAF (paroxysmal atrial fibrillation) ([place])   2. Chronic systolic CHF (congestive heart failure) ([place])   3. Ischemic cardiomyopathy   4. VT (ventricular tachycardia) (HCC)   5. Cardiac defibrillator in situ    PLAN:    In order of problems listed above:   #Chronic systolic heart failure #Ischemic cardiomyopathy #Ventricular tachycardia #ICD in situ NYHA class II.  Warm and dry on exam today.  Continue current medical therapy including Coreg, Jardiance, Lasix and Entresto.  Patient is currently on amiodarone 100 mg by mouth daily to help control her ventricular tachycardia.  ICD functioning appropriately.  Continue remote monitoring.  #  High risk med monitoring Last AST/ALT in April okay.  Last TSH in June 2023 okay.   Total time spent with patient today *** minutes. This includes reviewing records, evaluating the patient and coordinating care.   Medication Adjustments/Labs and Tests Ordered: Current medicines are reviewed at length with the patient today.  Concerns regarding medicines are outlined above.  No orders of the defined types were placed in this encounter.  No orders of the defined types were placed in this encounter.    Signed, Lars Mage, MD, [facility], [facility] 11/13/2021 9:47 PM    Electrophysiology [facility]

## 2021-11-14 ENCOUNTER — Encounter: Payer: Medicare HMO | Admitting: Cardiology

## 2021-11-14 DIAGNOSIS — Z79899 Other long term (current) drug therapy: Secondary | ICD-10-CM

## 2021-11-14 DIAGNOSIS — I480 Paroxysmal atrial fibrillation: Secondary | ICD-10-CM

## 2021-11-14 DIAGNOSIS — I255 Ischemic cardiomyopathy: Secondary | ICD-10-CM

## 2021-11-14 DIAGNOSIS — Z95810 Presence of automatic (implantable) cardiac defibrillator: Secondary | ICD-10-CM

## 2021-11-14 DIAGNOSIS — I5022 Chronic systolic (congestive) heart failure: Secondary | ICD-10-CM

## 2021-11-14 DIAGNOSIS — I472 Ventricular tachycardia, unspecified: Secondary | ICD-10-CM

## 2021-11-17 NOTE — Progress Notes (Deleted)
Electrophysiology Office Note Date: 11/17/2021  ID:  [name], DOB 01-21-1946, MRN [id number]  PCP: Pcp, No Primary Cardiologist: [name], MD Electrophysiologist: Lanier Prude, MD   CC: Routine ICD follow-up  [name] is a 76 y.o. female seen today for Lanier Prude, MD for routine electrophysiology followup.  Since last being seen in our clinic the patient reports doing ***.  she denies chest pain, palpitations, dyspnea, PND, orthopnea, nausea, vomiting, dizziness, syncope, edema, weight gain, or early satiety. {He/she (caps):30048} has not had ICD shocks.   Device History: SJM dual chamber ICD, implanted 2003,  2007, gen changes 2013, Jan 2022 2003 had syncope >> EP study with inducible PMVT + h/o PMVT 2012 with ICD shocks 04/24/21 appropriate tx for VT w/HV tx   At the time of her gen change Jan 2022,  SJM Riata ST 7040 lead was fluoroscopically and electrically normal today, opted to use this lead rather than replacing it after a long discussion with the patient prior to the procedure.     AAD Hx 2007 amiodarone started quickly stopped 2/2 nausea Remotely as well tried on Sotalol and Multaq unclear when/why they were stopped 2012 restarted on amiodarone  Past Medical History:  Diagnosis Date   Allergy    Arthritis    Atrial fibrillation (HCC)    controlled with amiodarone, on coumadin   Chronic renal insufficiency    Chronic systolic heart failure (HCC)    Congestive heart failure (HCC)    Coronary artery disease 01/31/2011   Diabetes mellitus    type 1   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    History of chicken pox    Hyperlipidemia    Hypertension    Ischemic cardiomyopathy    Lumbar spondylosis 01/11/2012   Sleep apnea    Stroke [facility]) 2010   eye doctor said she had TIA   Ventricular tachycardia (HCC)    Polymorphic   Past Surgical History:  Procedure Laterality Date   ABDOMINAL HYSTERECTOMY  1995   CARDIAC  DEFIBRILLATOR PLACEMENT     CHOLECYSTECTOMY  1985   COLONOSCOPY WITH PROPOFOL N/A 02/24/2018   Procedure: COLONOSCOPY WITH PROPOFOL;  Surgeon: Rachael Fee, MD;  Location: [facility] ENDOSCOPY;  Service: Endoscopy;  Laterality: N/A;   ICD  2003/2007   implanted by Dr Alanda Amass, most recent generator change 2/13 by Dr Johney Frame, Analyze ST study patient   ICD GENERATOR CHANGEOUT N/A 04/11/2020   Procedure: ICD GENERATOR CHANGEOUT;  Surgeon: Hillis Range, MD;  Location: [facility] INVASIVE CV LAB;  Service: Cardiovascular;  Laterality: N/A;   IMPLANTABLE CARDIOVERTER DEFIBRILLATOR (ICD) GENERATOR CHANGE N/A 05/05/2011   Procedure: ICD GENERATOR CHANGE;  Surgeon: Hillis Range, MD;  Location: [facility] CATH LAB;  Service: Cardiovascular;  Laterality: N/A;   LEFT HEART CATHETERIZATION WITH CORONARY ANGIOGRAM N/A 01/30/2011   Procedure: LEFT HEART CATHETERIZATION WITH CORONARY ANGIOGRAM;  Surgeon: Laurey Morale, MD;  Location: [facility] CATH LAB;  Service: Cardiovascular;  Laterality: N/A;   POLYPECTOMY  02/24/2018   Procedure: POLYPECTOMY;  Surgeon: Rachael Fee, MD;  Location: [facility] ENDOSCOPY;  Service: Endoscopy;;   TUBALIGATION  1980    Current Outpatient Medications  Medication Sig Dispense Refill   acetaminophen (TYLENOL) 325 MG tablet Take 2 tablets (650 mg total) by mouth every 4 (four) hours as needed for mild pain or moderate pain.     amiodarone (PACERONE) 100 MG tablet Take 1 tablet (100 mg total) by mouth daily. (Patient taking differently: Take 100  mg by mouth every morning.) 90 tablet 3   Blood Glucose Monitoring Suppl (ACCU-CHEK NANO SMARTVIEW) W/DEVICE KIT Use to test blood sugar 4 times daily as instructed. Dx code: E11.59 1 kit 0   busPIRone (BUSPAR) 5 MG tablet Take 1 tablet (5 mg total) by mouth 2 (two) times daily. 60 tablet 1   carvedilol (COREG) 12.5 MG tablet TAKE 1 TABLET (12.5 MG TOTAL) BY MOUTH 2 (TWO) TIMES DAILY WITH A MEAL. 180 tablet 3   Cholecalciferol (VITAMIN D3) 2000 UNITS TABS Take 2,000  Units by mouth every morning.     dicyclomine (BENTYL) 10 MG capsule TAKE 1 CAPSULE (10 MG TOTAL) BY MOUTH 4 (FOUR) TIMES DAILY BEFORE MEALS AND AT BEDTIME. 120 capsule 1   Dulaglutide (TRULICITY) 1.5 ZO/1.0RU SOPN Inject 1.5 mg into the skin once a week. 12 mL 3   ELIQUIS 5 MG TABS tablet TAKE 1 TABLET BY MOUTH TWICE A DAY (Patient taking differently: Take 5 mg by mouth 2 (two) times daily.) 60 tablet 11   empagliflozin (JARDIANCE) 10 MG TABS tablet Take 1 tablet (10 mg total) by mouth every morning. 90 tablet 3   ENTRESTO 49-51 MG TAKE 1 TABLET BY MOUTH TWICE A DAY (Patient taking differently: Take 1 tablet by mouth 2 (two) times daily.) 60 tablet 6   fluticasone (FLONASE) 50 MCG/ACT nasal spray Place 1 spray into both nostrils daily as needed for allergies or rhinitis.     furosemide (LASIX) 20 MG tablet Take 1 tablet by mouth daily 90 tablet 1   gabapentin (NEURONTIN) 100 MG capsule TAKE 2 CAPSULES BY MOUTH 2 TIMES DAILY. 360 capsule 0   glucose blood (ACCU-CHEK SMARTVIEW) test strip Use to test blood sugar 4 times daily as instructed. Dx code E11.59 200 each 11   insulin aspart (NOVOLOG FLEXPEN) 100 UNIT/ML FlexPen Inject up to 15 units daily under skin as advised (Patient taking differently: Inject 4-7 Units into the skin 3 (three) times daily before meals. Per sliding scale) 15 mL 11   insulin detemir (LEVEMIR FLEXTOUCH) 100 UNIT/ML FlexPen Inject 20 Units into the skin daily. (Patient taking differently: Inject 20 Units into the skin at bedtime.) 15 mL 11   levETIRAcetam (KEPPRA) 250 MG tablet Take 1 tablet (250 mg total) by mouth 2 (two) times daily. 180 tablet 1   magnesium oxide (MAG-OX) 400 (240 Mg) MG tablet TAKE 1 TABLET BY MOUTH TWICE A DAY 180 tablet 1   Multiple Vitamins-Minerals (EYE VITAMINS PO) Take 1 tablet by mouth every morning.     nitroGLYCERIN (NITROSTAT) 0.4 MG SL tablet Place 1 tablet (0.4 mg total) under the tongue every 5 (five) minutes as needed for chest pain. 25 tablet  1   omeprazole (PRILOSEC OTC) 20 MG tablet Take 20 mg by mouth every morning.     rosuvastatin (CRESTOR) 40 MG tablet TAKE 1 TABLET BY MOUTH EVERY DAY 90 tablet 3   No current facility-administered medications for this visit.    Allergies:   Veltassa [patiromer], Darvon, Promethazine hcl, Spironolactone, and Plavix [clopidogrel bisulfate]   Social History: Social History   Socioeconomic History   Marital status: Divorced    Spouse name: Not on file   Number of children: 3   Years of education: 12   Highest education level: Not on file  Occupational History   Occupation: Retried  Tobacco Use   Smoking status: Former    Types: Cigarettes    Quit date: 08/16/1995    Years since quitting: 23.2  Smokeless tobacco: Never  Vaping Use   Vaping Use: Never used  Substance and Sexual Activity   Alcohol use: No   Drug use: No   Sexual activity: Never    Birth control/protection: Post-menopausal  Other Topics Concern   Not on file  Social History Narrative   Regular exercise-no Caffeine Use- sometimes   Lives with family   Social Determinants of Health   Financial Resource Strain: Not on file  Food Insecurity: Not on file  Transportation Needs: Not on file  Physical Activity: Not on file  Stress: Not on file  Social Connections: Not on file  Intimate Partner Violence: Not on file    Family History: Family History  Problem Relation Age of Onset   Diabetes Mother    Heart disease Mother    Hyperlipidemia Mother    Hypertension Mother    Heart disease Father    Heart attack Father    Hypertension Father    Breast cancer Sister    Lung cancer Sister    Irritable bowel syndrome Sister    Early death Brother 2022-07-03   Colon cancer Maternal Aunt    Seizures Granddaughter    Esophageal cancer Neg Hx    Colon polyps Neg Hx     Review of Systems: All other systems reviewed and are otherwise negative except as noted above.   Physical Exam: There were no vitals filed for  this visit.   GEN- The patient is well appearing, alert and oriented x 3 today.   HEENT: normocephalic, atraumatic; sclera clear, conjunctiva pink; hearing intact; oropharynx clear; neck supple, no JVP Lymph- no cervical lymphadenopathy Lungs- Clear to ausculation bilaterally, normal work of breathing.  No wheezes, rales, rhonchi Heart- Regular rate and rhythm, no murmurs, rubs or gallops, PMI not laterally displaced GI- soft, non-tender, non-distended, bowel sounds present, no hepatosplenomegaly Extremities- no clubbing or cyanosis. No edema; DP/PT/radial pulses 2+ bilaterally MS- no significant deformity or atrophy Skin- warm and dry, no rash or lesion; ICD pocket well healed Psych- euthymic mood, full affect Neuro- strength and sensation are intact  ICD interrogation- reviewed in detail today,  See PACEART report  EKG:  EKG is not ordered today. Personal review of EKG ordered  08/30/2021  shows NSR at 64 bpm  Recent Labs: 05/20/2021: Magnesium 2.4 06/27/2021: ALT 11 08/30/2021: BUN 33; Potassium 4.3; Sodium 142 08/31/2021: Creatinine, Ser 1.60; Hemoglobin 13.3; Platelets 225; TSH 2.518   Wt Readings from Last 3 Encounters:  11/11/21 185 lb 3.2 oz (84 kg)  09/26/21 179 lb (81.2 kg)  08/31/21 178 lb (80.7 kg)     Other studies Reviewed: Additional studies/ records that were reviewed today include: Previous EP office notes.   Assessment and Plan:  1.  Chronic systolic dysfunction s/p St. Jude CRT-D  euvolemic today Stable on an appropriate medical regimen Normal ICD function See Pace Art report No changes today  2. H/o PMVT Continue low dose amiodarone.  Had episode in February, isolated, with much stress surrounding.  Labs today. No change for now  3. Paroxysmal Atrial Fibrillation  EKG today shows *** Burden *** by device.  Continue Eliquis for CHA2DS2VASC of at least 9  Continue amiodarone as above  Current medicines are reviewed at length with the patient today.    =  Labs/ tests ordered today include: *** No orders of the defined types were placed in this encounter.    Disposition:   Follow up with Dr. Quentin Ore in 6 months  Jacalyn Lefevre, PA-C  11/17/2021 11:36 AM  [facility] [address] [place] [address] [phone] (office) [phone] (fax)

## 2021-11-20 ENCOUNTER — Encounter: Payer: Medicare HMO | Admitting: Student

## 2021-11-20 DIAGNOSIS — I2510 Atherosclerotic heart disease of native coronary artery without angina pectoris: Secondary | ICD-10-CM

## 2021-11-20 DIAGNOSIS — I480 Paroxysmal atrial fibrillation: Secondary | ICD-10-CM

## 2021-11-20 DIAGNOSIS — I255 Ischemic cardiomyopathy: Secondary | ICD-10-CM

## 2021-11-20 DIAGNOSIS — I472 Ventricular tachycardia, unspecified: Secondary | ICD-10-CM

## 2021-11-26 ENCOUNTER — Other Ambulatory Visit: Payer: Self-pay | Admitting: Internal Medicine

## 2021-11-26 DIAGNOSIS — E1142 Type 2 diabetes mellitus with diabetic polyneuropathy: Secondary | ICD-10-CM

## 2021-12-18 ENCOUNTER — Ambulatory Visit (HOSPITAL_COMMUNITY)
Admission: RE | Admit: 2021-12-18 | Discharge: 2021-12-18 | Disposition: A | Discharge status: Home / Self Care | Payer: Medicare HMO | Source: Ambulatory Visit | Attending: Cardiology | Admitting: Cardiology

## 2021-12-18 VITALS — BP 120/70 | HR 70 | Wt 177.6 lb

## 2021-12-18 DIAGNOSIS — Z794 Long term (current) use of insulin: Secondary | ICD-10-CM | POA: Insufficient documentation

## 2021-12-18 DIAGNOSIS — I255 Ischemic cardiomyopathy: Secondary | ICD-10-CM | POA: Diagnosis not present

## 2021-12-18 DIAGNOSIS — Z7984 Long term (current) use of oral hypoglycemic drugs: Secondary | ICD-10-CM | POA: Diagnosis not present

## 2021-12-18 DIAGNOSIS — E1143 Type 2 diabetes mellitus with diabetic autonomic (poly)neuropathy: Secondary | ICD-10-CM | POA: Insufficient documentation

## 2021-12-18 DIAGNOSIS — E039 Hypothyroidism, unspecified: Secondary | ICD-10-CM | POA: Insufficient documentation

## 2021-12-18 DIAGNOSIS — Z8673 Personal history of transient ischemic attack (TIA), and cerebral infarction without residual deficits: Secondary | ICD-10-CM | POA: Insufficient documentation

## 2021-12-18 DIAGNOSIS — Z7901 Long term (current) use of anticoagulants: Secondary | ICD-10-CM | POA: Diagnosis not present

## 2021-12-18 DIAGNOSIS — Z7985 Long-term (current) use of injectable non-insulin antidiabetic drugs: Secondary | ICD-10-CM | POA: Diagnosis not present

## 2021-12-18 DIAGNOSIS — I480 Paroxysmal atrial fibrillation: Secondary | ICD-10-CM | POA: Diagnosis not present

## 2021-12-18 DIAGNOSIS — I472 Ventricular tachycardia, unspecified: Secondary | ICD-10-CM | POA: Insufficient documentation

## 2021-12-18 DIAGNOSIS — I5022 Chronic systolic (congestive) heart failure: Secondary | ICD-10-CM | POA: Insufficient documentation

## 2021-12-18 DIAGNOSIS — Z95810 Presence of automatic (implantable) cardiac defibrillator: Secondary | ICD-10-CM | POA: Insufficient documentation

## 2021-12-18 DIAGNOSIS — N183 Chronic kidney disease, stage 3 unspecified: Secondary | ICD-10-CM | POA: Diagnosis not present

## 2021-12-18 DIAGNOSIS — E785 Hyperlipidemia, unspecified: Secondary | ICD-10-CM | POA: Diagnosis not present

## 2021-12-18 DIAGNOSIS — I130 Hypertensive heart and chronic kidney disease with heart failure and stage 1 through stage 4 chronic kidney disease, or unspecified chronic kidney disease: Secondary | ICD-10-CM | POA: Insufficient documentation

## 2021-12-18 DIAGNOSIS — E1122 Type 2 diabetes mellitus with diabetic chronic kidney disease: Secondary | ICD-10-CM | POA: Diagnosis not present

## 2021-12-18 DIAGNOSIS — Z79899 Other long term (current) drug therapy: Secondary | ICD-10-CM | POA: Diagnosis not present

## 2021-12-18 DIAGNOSIS — I2510 Atherosclerotic heart disease of native coronary artery without angina pectoris: Secondary | ICD-10-CM | POA: Diagnosis not present

## 2021-12-18 DIAGNOSIS — M170 Bilateral primary osteoarthritis of knee: Secondary | ICD-10-CM | POA: Diagnosis not present

## 2021-12-18 LAB — CBC
HCT: 38.5 % (ref 36.0–46.0)
Hemoglobin: 12.2 g/dL (ref 12.0–15.0)
MCH: 28.9 pg (ref 26.0–34.0)
MCHC: 31.7 g/dL (ref 30.0–36.0)
MCV: 91.2 fL (ref 80.0–100.0)
Platelets: 233 10*3/uL (ref 150–400)
RBC: 4.22 MIL/uL (ref 3.87–5.11)
RDW: 15.9 % — ABNORMAL HIGH (ref 11.5–15.5)
WBC: 5.4 10*3/uL (ref 4.0–10.5)
nRBC: 0 % (ref 0.0–0.2)

## 2021-12-18 LAB — COMPREHENSIVE METABOLIC PANEL
ALT: 9 U/L (ref 0–44)
AST: 15 U/L (ref 15–41)
Albumin: 3.5 g/dL (ref 3.5–5.0)
Alkaline Phosphatase: 66 U/L (ref 38–126)
Anion gap: 9 (ref 5–15)
BUN: 20 mg/dL (ref 8–23)
CO2: 22 mmol/L (ref 22–32)
Calcium: 8.8 mg/dL — ABNORMAL LOW (ref 8.9–10.3)
Chloride: 114 mmol/L — ABNORMAL HIGH (ref 98–111)
Creatinine, Ser: 1.31 mg/dL — ABNORMAL HIGH (ref 0.44–1.00)
GFR, Estimated: 43 mL/min — ABNORMAL LOW (ref >60)
Glucose, Bld: 94 mg/dL (ref 70–99)
Potassium: 3.7 mmol/L (ref 3.5–5.1)
Sodium: 145 mmol/L (ref 135–145)
Total Bilirubin: 0.5 mg/dL (ref 0.3–1.2)
Total Protein: 6.9 g/dL (ref 6.5–8.1)

## 2021-12-18 LAB — LIPID PANEL
Cholesterol: 125 mg/dL (ref 0–200)
HDL: 50 mg/dL (ref >40)
LDL Cholesterol: 66 mg/dL (ref 0–99)
Total CHOL/HDL Ratio: 2.5 RATIO
Triglycerides: 44 mg/dL (ref <150)
VLDL: 9 mg/dL (ref 0–40)

## 2021-12-18 LAB — TSH: TSH: 2.506 u[IU]/mL (ref 0.350–4.500)

## 2021-12-18 MED ORDER — ENTRESTO 97-103 MG PO TABS: 1.0000 | ORAL_TABLET | Freq: Two times a day (BID) | ORAL | 11 refills | Status: DC

## 2021-12-18 MED ORDER — FUROSEMIDE 20 MG PO TABS: 20.0000 mg | ORAL_TABLET | ORAL | 1 refills | Status: DC

## 2021-12-18 NOTE — Progress Notes (Signed)
ID:  [name], DOB May 12, 1945, MRN [id number]  Provider location: [facility] Type of Visit: Established patient   PCP:  Luciano Cutter, MD  HF Cardiologist:  Loralie Champagne, MD   History of Present Illness: [name] is a 76 y.o. female who has a history of CAD, ischemic cardiomyopathy, and atrial fibrillation.  Patient was hospitalized in 11/12 at [facility] for VT with ICD discharge.  She had a left heart cath showing patent stents.  EF was 35-40% by echo.  She is on amiodarone.  She has had periodic problems with creatinine rising with medication adjustments.  Echo in 12/18 showed EF 35-40%, inferior and septal hypokinesis, mild MR.    Echo in 5/20 showed EF 40%, normal RV, mild AI.   She did not tolerate eplerenone due to nausea.  Echo in 4/21 showed EF 40-45% with inferior/inferoseptal HK, normal RV.   In 5/22, she had COVID-19 infection.    Echo 10/22 EF 40% with basal-mid inferior and inferoseptal severe hypokinesis, normal RV, mild AI.   Notified by [facility] 04/24/21 that she had VT, with shock.    In 6/23, she was admitted with altered mental status.  CT showed old right frontal infarct.  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  Echo in 6/23 showed EF 40%, mild LVH, normal IVC.   Today she returns for HF follow up.  No chest pain.  She is mainly limited by severe bilateral knee arthritis and walks with a walker.  Breathing is stable, no dyspnea walking around her house.  She was tired walking into the office today. No lightheadedness.    St Jude device interrogation (personally reviewed): CoreVue stable, A-paced 73%, V-paced 9.6%, 3.9% atrial fibrillation. No VT.   ECG (personally reviewed): NSR, 1st degree AVB, LVH  Labs (10/12): LDL 73, HDL 44 Labs (11/12): K 3.9, creatinine 1.1, LFTs normal, TSH normal Labs (12/12): K 3.9, creatinine 1.5, proBNP 18 Labs (1/13): LDL 82, HDL 57 Labs (2/13): K 4.3, creatinine 1.5 Labs (5/13): creatinine 2.4 =>  1.7, LFTs normal, TSH normal, proBNP 18 Labs (6/13): K 4.2, creatinine 1.7 Labs (10/13): K 4.3, creatinine 1.4 Labs (4/14); LFTs normal, TSH normal, LDL 71, HDL 58 Labs (5/14): K 4.5, creatinine 1.4 Labs (8/14): TSH normal, LFTs normal Labs (9/14): K 4.2, creatinine 1.8, LDL 75, HDL 54 Labs (07/31/13) K 3.7 Creatinine 1.8 BNP 148  Labs (9/15): K 4.3, creatinine 1.24, LFTs normal, LDL 81, HDL 58, TSH normal Labs (9/16): K 4.4, creatinine 1.44, LDL 64, HDL 54, LFTs normal, TSH normal, HCT 38 Labs (2/17): K 4.9, creatinine 1.76, HCT 38.6 Labs (1/19): LDL 71, HDL 58, K 4.8, creatinine 1.26, LFTs normal, hgb 12.9.  Labs (2/19): Creatinine 1.35, K 4.9 Labs (6/19): K 4.4, creatinine 1.57 Labs (8/19): K 5, creatinine 1.59, LFTs normal Labs (2/20): K 4.4, creatinine 1.4, LDL 72, HDL 55, normal LFTs Labs (5/20): LDL 81, K 4.8, creatinine 1.69 Labs (10/20): hgb 13.6, K 4.2, creatinine 1.62, LFTs normal, LDL 81, HDL 50 Labs (12/20): LDL 88 Labs (3/21): K 4, creatinine 1.56 Labs (5/22): K 4, creatinine 1.64 Labs (7/22): LDL 76 Labs (10/22): K 4.3, creatinine 1.40, LFTs normal, TSH low (Levoxyl stopped). Labs (6/23): LDL 81, K 4.3, creatinine 1.78  PMH: 1. Diabetes mellitus 2. CVA, TIA in 2010.   3. HTN 4. CKD stage 3 5. H/o TAH 6. H/o CCY 7. Sciatica 8. Atrial fibrillation 9. CAD: s/p LAD and RCA PCI.  Last LHC in 11/12  with patent proximal LAD stent, ostial 70% D1 (jailed by stent), mild LAD stent patent, patent RCA stents, EF 40% with global hypokinesis.  10. Ischemic cardiomyopathy: Echo (10/12): EF 35-40%, moderate focal basal septal hypertrophy, inferior akinesis, grade I diastolic dysfunction, moderate aortic insufficiency. St Jude dual chamber ICD.  Spironolactone stopped when creatinine rose to 2.5.  Echo (5/14) with EF 40-45%, mild AI.  Echo (6/16) with EF 40-45%, inferior/inferoseptal hypokinesis, mild LVH.  - Echo (12/18): EF 35-40%, inferior and septal hypokinesis, mild MR, mild AI.   - Echo (5/20): EF 40%, inferior and inferoseptal hypokinesis, normal RV size and systolic function, mild AI, normal IVC.  - Unable to tolerate eplerenone.  - Echo (4/21): EF 40-45%, inferior/inferoseptal HK, normal RV, mild AI.  - Echo (10/22): EF 40% with basal-mid inferior and inferoseptal severe hypokinesis, normal RV, mild AI.  - Echo (6/23): EF 40%, mild LVH, IVC normal 11. Aortic insufficiency: moderate in past but mild on most recent echo.   12. 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  13. Gastroparesis 14. Hypothyroidism 15. COVID-19 5/22 16. Depression 17. Seizure disorder: Suspected to be related to prior CVA.  18. CTA head/neck (6/23): No significant carotid disease.   SH: Divorced.  3 children.  Quit smoking in 1997. Lives in [place] now with daughter.  [place].   FH: CAD  ROS: All systems reviewed and negative except as per HPI.   Current Outpatient Medications  Medication Sig Dispense Refill   acetaminophen (TYLENOL) 325 MG tablet Take 2 tablets (650 mg total) by mouth every 4 (four) hours as needed for mild pain or moderate pain.     amiodarone (PACERONE) 100 MG tablet Take 1 tablet (100 mg total) by mouth daily. (Patient taking differently: Take 100 mg by mouth every morning.) 90 tablet 3   Blood Glucose Monitoring Suppl (ACCU-CHEK NANO SMARTVIEW) W/DEVICE KIT Use to test blood sugar 4 times daily as instructed. Dx code: E11.59 1 kit 0   busPIRone (BUSPAR) 5 MG tablet Take 1 tablet (5 mg total) by mouth 2 (two) times daily. 60 tablet 1   carvedilol (COREG) 12.5 MG tablet TAKE 1 TABLET (12.5 MG TOTAL) BY MOUTH 2 (TWO) TIMES DAILY WITH A MEAL. 180 tablet 3   Cholecalciferol (VITAMIN D3) 2000 UNITS TABS Take 2,000 Units by mouth every morning.     dicyclomine (BENTYL) 10 MG capsule TAKE 1 CAPSULE (10 MG TOTAL) BY MOUTH 4 (FOUR) TIMES DAILY BEFORE MEALS AND AT BEDTIME. 120 capsule 1   Dulaglutide (TRULICITY) 1.5 XB/1.4NW SOPN Inject 1.5 mg into the skin once a week. 12 mL 3    ELIQUIS 5 MG TABS tablet TAKE 1 TABLET BY MOUTH TWICE A DAY (Patient taking differently: Take 5 mg by mouth 2 (two) times daily.) 60 tablet 11   empagliflozin (JARDIANCE) 10 MG TABS tablet Take 1 tablet (10 mg total) by mouth every morning. 90 tablet 3   gabapentin (NEURONTIN) 100 MG capsule TAKE 2 CAPSULES BY MOUTH TWICE A DAY 360 capsule 0   insulin aspart (NOVOLOG FLEXPEN) 100 UNIT/ML FlexPen Inject up to 15 units daily under skin as advised (Patient taking differently: Inject 4-7 Units into the skin 3 (three) times daily before meals. Per sliding scale) 15 mL 11   insulin detemir (LEVEMIR FLEXTOUCH) 100 UNIT/ML FlexPen Inject 20 Units into the skin daily. (Patient taking differently: Inject 20 Units into the skin at bedtime.) 15 mL 11   levETIRAcetam (KEPPRA) 250 MG tablet Take 1 tablet (250 mg total) by  mouth 2 (two) times daily. 180 tablet 1   magnesium oxide (MAG-OX) 400 (240 Mg) MG tablet TAKE 1 TABLET BY MOUTH TWICE A DAY 180 tablet 1   Multiple Vitamins-Minerals (EYE VITAMINS PO) Take 1 tablet by mouth every morning.     nitroGLYCERIN (NITROSTAT) 0.4 MG SL tablet Place 1 tablet (0.4 mg total) under the tongue every 5 (five) minutes as needed for chest pain. 25 tablet 1   omeprazole (PRILOSEC OTC) 20 MG tablet Take 20 mg by mouth every morning.     rosuvastatin (CRESTOR) 40 MG tablet TAKE 1 TABLET BY MOUTH EVERY DAY 90 tablet 3   sacubitril-valsartan (ENTRESTO) 97-103 MG Take 1 tablet by mouth 2 (two) times daily. 60 tablet 11   fluticasone (FLONASE) 50 MCG/ACT nasal spray Place 1 spray into both nostrils daily as needed for allergies or rhinitis.     furosemide (LASIX) 20 MG tablet Take 1 tablet (20 mg total) by mouth every other day. 90 tablet 1   glucose blood (ACCU-CHEK SMARTVIEW) test strip Use to test blood sugar 4 times daily as instructed. Dx code E11.59 200 each 11   No current facility-administered medications for this encounter.   Wt Readings from Last 3 Encounters:   12/18/21 80.6 kg (177 lb 9.6 oz)  11/11/21 84 kg (185 lb 3.2 oz)  09/26/21 81.2 kg (179 lb)   BP 120/70   Pulse 70   Wt 80.6 kg (177 lb 9.6 oz)   SpO2 97%   BMI 31.46 kg/m  General: NAD Neck: No JVD, no thyromegaly or thyroid nodule.  Lungs: Clear to auscultation bilaterally with normal respiratory effort. CV: Nondisplaced PMI.  Heart regular S1/S2, no S3/S4, no murmur.  No peripheral edema.  No carotid bruit.  Normal pedal pulses.  Abdomen: Soft, nontender, no hepatosplenomegaly, no distention.  Skin: Intact without lesions or rashes.  Neurologic: Alert and oriented x 3.  Psych: Normal affect. Extremities: No clubbing or cyanosis.  HEENT: Normal.   Assessment/Plan 1. Atrial fibrillation: Paroxysmal.   - Continue amiodarone 100 mg daily.  Check LFTs and TSH. She will need regular eye exams.  - Continue Eliquis 5 mg bid. No bleeding issues. 2. Coronary artery disease: No chest pain.  Nonobstructive disease on last cath.  - Continue statin, check lipids today.  - No ASA given Eliquis use.   3. Ventricular tachycardia: No recent VT on device interrogation.  - On amiodarone 100 mg daily. 4. Chronic systolic CHF: Ischemic cardiomyopathy.  EF 40% on 6/23 echo. She has a Research officer, political party ICD. NYHA class II symptoms, limited most by orthopedic pain.  Not volume overloaded by exam or Corvue.   - Increase Entresto to 97/103 bid.  BMET today and in 10 days.  - Continue Coreg 12.5 mg bid.   - Continue Lasix 20 mg daily.  BMET today.  - She has tolerated neither spironolactone nor eplerenone.  - Continue Jardiance 10 mg daily.  5. CKD stage 3: BMET today.  6. Hypothyroidism: Managed by PCP. Now off levothyroxine. 7. Hyperlipidemia: On Crestor, check lipids today.   Followup in 4 months with APP  Signed, Loralie Champagne, MD  12/18/2021  [facility] [address] [facility] [place] [address] [phone] (office) [phone] (fax)

## 2021-12-18 NOTE — Patient Instructions (Signed)
Increase Entresto to 97/103 Twice daily  Change Lasix '20mg'$  the every other day  Labs done today, your results will be available in MyChart, we will contact you for abnormal readings.  Repeat blood work in 10 days  Your physician recommends that you schedule a follow-up appointment in: 4 months ( January 2024)  ** please call the office in November to arrange your follow up appointment **  If you have any questions or concerns before your next appointment please send [name] a message through [place] or call our office at [phone].    TO LEAVE A MESSAGE FOR THE NURSE SELECT OPTION 2, PLEASE LEAVE A MESSAGE INCLUDING: YOUR NAME DATE OF BIRTH CALL BACK NUMBER REASON FOR CALL**this is important as we prioritize the call backs  YOU WILL RECEIVE A CALL BACK THE SAME DAY AS LONG AS YOU CALL BEFORE 4:00 PM  At the [facility], you and your health needs are our priority. As part of our continuing mission to provide you with exceptional heart care, we have created designated Provider Care Teams. These Care Teams include your primary Cardiologist (physician) and Advanced Practice Providers (APPs- Physician Assistants and Nurse Practitioners) who all work together to provide you with the care you need, when you need it.   You may see any of the following providers on your designated Care Team at your next follow up: Dr Glori Bickers Dr Loralie Champagne Dr. Roxana Hires, NP Lyda Jester, [place] [facility] [place], [place] Forestine Na, NP Audry Riles, PharmD   Please be sure to bring in all your medications bottles to every appointment.

## 2021-12-19 ENCOUNTER — Other Ambulatory Visit (HOSPITAL_COMMUNITY): Payer: Self-pay | Admitting: Cardiology

## 2021-12-19 ENCOUNTER — Other Ambulatory Visit: Payer: Self-pay | Admitting: Internal Medicine

## 2021-12-19 ENCOUNTER — Other Ambulatory Visit (HOSPITAL_COMMUNITY): Payer: Self-pay | Admitting: Family Medicine

## 2021-12-19 DIAGNOSIS — I5022 Chronic systolic (congestive) heart failure: Secondary | ICD-10-CM

## 2021-12-25 ENCOUNTER — Telehealth: Payer: Self-pay

## 2021-12-25 NOTE — Telephone Encounter (Signed)
Patient LVM requesting rx refill of Gabapentin. Informed her it was sent on 9/7 360 capsules. Patient will contact her pharmacy to check on the status.

## 2021-12-30 ENCOUNTER — Encounter: Payer: Self-pay | Admitting: Cardiology

## 2022-01-05 ENCOUNTER — Other Ambulatory Visit (HOSPITAL_COMMUNITY): Payer: Self-pay | Admitting: *Deleted

## 2022-01-05 DIAGNOSIS — I5022 Chronic systolic (congestive) heart failure: Secondary | ICD-10-CM

## 2022-01-06 ENCOUNTER — Ambulatory Visit (HOSPITAL_COMMUNITY)
Admission: RE | Admit: 2022-01-06 | Discharge: 2022-01-06 | Disposition: A | Discharge status: Home / Self Care | Payer: Medicare HMO | Source: Ambulatory Visit | Attending: Cardiology | Admitting: Cardiology

## 2022-01-06 DIAGNOSIS — I5022 Chronic systolic (congestive) heart failure: Secondary | ICD-10-CM | POA: Insufficient documentation

## 2022-01-06 LAB — BASIC METABOLIC PANEL
Anion gap: 7 (ref 5–15)
BUN: 13 mg/dL (ref 8–23)
CO2: 24 mmol/L (ref 22–32)
Calcium: 8.7 mg/dL — ABNORMAL LOW (ref 8.9–10.3)
Chloride: 112 mmol/L — ABNORMAL HIGH (ref 98–111)
Creatinine, Ser: 1.19 mg/dL — ABNORMAL HIGH (ref 0.44–1.00)
GFR, Estimated: 48 mL/min — ABNORMAL LOW (ref >60)
Glucose, Bld: 144 mg/dL — ABNORMAL HIGH (ref 70–99)
Potassium: 4.5 mmol/L (ref 3.5–5.1)
Sodium: 143 mmol/L (ref 135–145)

## 2022-01-12 ENCOUNTER — Encounter: Payer: Medicare HMO | Admitting: Cardiology

## 2022-01-14 ENCOUNTER — Ambulatory Visit (INDEPENDENT_AMBULATORY_CARE_PROVIDER_SITE_OTHER): Payer: Medicare HMO

## 2022-01-14 DIAGNOSIS — I255 Ischemic cardiomyopathy: Secondary | ICD-10-CM

## 2022-01-15 ENCOUNTER — Other Ambulatory Visit: Payer: Self-pay | Admitting: Internal Medicine

## 2022-01-15 ENCOUNTER — Other Ambulatory Visit: Payer: Self-pay | Admitting: Neurology

## 2022-01-15 ENCOUNTER — Other Ambulatory Visit (HOSPITAL_COMMUNITY): Payer: Self-pay | Admitting: Family Medicine

## 2022-01-15 DIAGNOSIS — I5022 Chronic systolic (congestive) heart failure: Secondary | ICD-10-CM

## 2022-01-15 DIAGNOSIS — E1165 Type 2 diabetes mellitus with hyperglycemia: Secondary | ICD-10-CM

## 2022-01-15 LAB — CUP PACEART REMOTE DEVICE CHECK
Battery Remaining Longevity: 72 mo
Battery Remaining Percentage: 80 %
Battery Voltage: 3.02 V
Brady Statistic AP VP Percent: 7.4 %
Brady Statistic AP VS Percent: 73 %
Brady Statistic AS VP Percent: 2.1 %
Brady Statistic AS VS Percent: 12 %
Brady Statistic RA Percent Paced: 70 %
Brady Statistic RV Percent Paced: 11 %
Date Time Interrogation Session: 20231025020808
HighPow Impedance: 47 Ohm
HighPow Impedance: 48 Ohm
Implantable Lead Connection Status: 753985
Implantable Lead Connection Status: 753985
Implantable Lead Implant Date: 20070117
Implantable Lead Implant Date: 20070117
Implantable Lead Location: 753859
Implantable Lead Location: 753860
Implantable Lead Model: 7040
Implantable Pulse Generator Implant Date: 20220120
Lead Channel Impedance Value: 510 Ohm
Lead Channel Impedance Value: 510 Ohm
Lead Channel Pacing Threshold Amplitude: 0.75 V
Lead Channel Pacing Threshold Amplitude: 0.75 V
Lead Channel Pacing Threshold Pulse Width: 0.5 ms
Lead Channel Pacing Threshold Pulse Width: 0.5 ms
Lead Channel Sensing Intrinsic Amplitude: 11.8 mV
Lead Channel Sensing Intrinsic Amplitude: 5 mV
Lead Channel Setting Pacing Amplitude: 2.5 V
Lead Channel Setting Pacing Amplitude: 2.5 V
Lead Channel Setting Pacing Pulse Width: 0.5 ms
Lead Channel Setting Sensing Sensitivity: 0.5 mV
Pulse Gen Serial Number: 9976817

## 2022-01-16 ENCOUNTER — Ambulatory Visit: Payer: Medicare HMO | Admitting: Gastroenterology

## 2022-01-23 MED ORDER — OZEMPIC (0.25 OR 0.5 MG/DOSE) 2 MG/3ML ~~LOC~~ SOPN: 0.5000 mg | PEN_INJECTOR | SUBCUTANEOUS | 2 refills | Status: DC

## 2022-01-27 NOTE — Progress Notes (Signed)
Remote ICD transmission.   

## 2022-02-23 ENCOUNTER — Other Ambulatory Visit: Payer: Self-pay

## 2022-02-24 ENCOUNTER — Encounter: Payer: Self-pay | Admitting: Physician Assistant

## 2022-02-24 ENCOUNTER — Ambulatory Visit: Payer: Medicare HMO | Admitting: Physician Assistant

## 2022-02-24 VITALS — BP 130/62 | HR 68 | Ht 63.0 in | Wt 181.0 lb

## 2022-02-24 DIAGNOSIS — Z86010 Personal history of colonic polyps: Secondary | ICD-10-CM | POA: Diagnosis not present

## 2022-02-24 NOTE — Patient Instructions (Signed)
Do not plan further surveillance of colonoscopies Follow up as needed  If you are age 75 or older, your body mass index should be between 23-30. Your Body mass index is 32.06 kg/m. If this is out of the aforementioned range listed, please consider follow up with your Primary Care Provider.  If you are age 65 or younger, your body mass index should be between 19-25. Your Body mass index is 32.06 kg/m. If this is out of the aformentioned range listed, please consider follow up with your Primary Care Provider.    The [facility] providers would like to encourage you to use [facility] to communicate with providers for non-urgent requests or questions.  Due to long hold times on the telephone, sending your provider a message by [facility] may be a faster and more efficient way to get a response.  Please allow 48 business hours for a response.  Please remember that this is for non-urgent requests.   Thank you for entrusting me with your care and choosing [facility].  Amy Esterwood PA-C

## 2022-02-24 NOTE — Progress Notes (Signed)
Subjective:    Patient ID: [name], female    DOB: November 12, 1945, 76 y.o.   MRN: [id number]  HPI [name] is a pleasant 76 year old female, established with Dr. Ardis Hughs who comes in today to discuss follow-up colonoscopy.  She was last seen here in 2019 when she underwent colonoscopy done at the hospital, in December 2019 and was found to have 3 small polyps all measuring 1 to 3 mm in size.  These were completely removed and biopsies 3 tubular adenomas.  Initial recommendation was for 3-year interval follow-up.  Patient says she is feeling fine currently has no GI complaints, specifically no complaints of abdominal pain, no issues with her bowel habits, no melena or hematochezia.  She does occasionally get gas trapping or spasm in the left upper quadrant. She has numerous comorbidities including atrial fibrillation for which she is on Eliquis, history of V. tach, status post ICD, ischemic cardiomyopathy with most recent echo June 2023 showing EF of about 40%, no aortic stenosis. Adult onset diabetes mellitus, congestive heart failure, chronic kidney disease, and she had an admission in June 2023 at which time she was diagnosed with a seizure.  She has not had any recurrence of seizure activity since that time and is on Keppra.  She is also status post prior CVA.  She has severe arthritis and ambulates with a walker.    Review of Systems Pertinent positive and negative review of systems were noted in the above HPI section.  All other review of systems was otherwise negative.   Outpatient Encounter Medications as of 02/24/2022  Medication Sig   acetaminophen (TYLENOL) 325 MG tablet Take 2 tablets (650 mg total) by mouth every 4 (four) hours as needed for mild pain or moderate pain.   amiodarone (PACERONE) 100 MG tablet Take 1 tablet (100 mg total) by mouth daily. (Patient taking differently: Take 100 mg by mouth every morning.)   Blood Glucose Monitoring Suppl (ACCU-CHEK NANO SMARTVIEW)  W/DEVICE KIT Use to test blood sugar 4 times daily as instructed. Dx code: E11.59   busPIRone (BUSPAR) 5 MG tablet Take 1 tablet (5 mg total) by mouth 2 (two) times daily.   carvedilol (COREG) 12.5 MG tablet TAKE 1 TABLET (12.5 MG TOTAL) BY MOUTH 2 (TWO) TIMES DAILY WITH A MEAL.   Cholecalciferol (VITAMIN D3) 2000 UNITS TABS Take 2,000 Units by mouth every morning.   dicyclomine (BENTYL) 10 MG capsule TAKE 1 CAPSULE (10 MG TOTAL) BY MOUTH 4 (FOUR) TIMES DAILY BEFORE MEALS AND AT BEDTIME.   ELIQUIS 5 MG TABS tablet TAKE 1 TABLET BY MOUTH TWICE A DAY   empagliflozin (JARDIANCE) 10 MG TABS tablet Take 1 tablet (10 mg total) by mouth every morning.   furosemide (LASIX) 20 MG tablet TAKE 1 TABLET BY MOUTH EVERY DAY   gabapentin (NEURONTIN) 100 MG capsule TAKE 2 CAPSULES BY MOUTH TWICE A DAY   insulin aspart (NOVOLOG FLEXPEN) 100 UNIT/ML FlexPen Inject up to 15 units daily under skin as advised (Patient taking differently: Inject 4-7 Units into the skin 3 (three) times daily before meals. Per sliding scale)   insulin detemir (LEVEMIR FLEXTOUCH) 100 UNIT/ML FlexPen Inject 20 Units into the skin daily. (Patient taking differently: Inject 20 Units into the skin at bedtime.)   levETIRAcetam (KEPPRA) 250 MG tablet TAKE 1 TABLET BY MOUTH TWICE A DAY   magnesium oxide (MAG-OX) 400 (240 Mg) MG tablet TAKE 1 TABLET BY MOUTH TWICE A DAY   Multiple Vitamins-Minerals (EYE VITAMINS PO) Take  1 tablet by mouth every morning.   nitroGLYCERIN (NITROSTAT) 0.4 MG SL tablet Place 1 tablet (0.4 mg total) under the tongue every 5 (five) minutes as needed for chest pain.   NOVOLOG 100 UNIT/ML injection INJECT 10-16 UNITS INTO THE SKIN 3 (THREE) TIMES DAILY WITH MEALS.   omeprazole (PRILOSEC OTC) 20 MG tablet Take 20 mg by mouth every morning.   rosuvastatin (CRESTOR) 40 MG tablet TAKE 1 TABLET BY MOUTH EVERY DAY   sacubitril-valsartan (ENTRESTO) 97-103 MG Take 1 tablet by mouth 2 (two) times daily.   Semaglutide,0.25 or  0.5MG/DOS, (OZEMPIC, 0.25 OR 0.5 MG/DOSE,) 2 MG/3ML SOPN Inject 0.5 mg into the skin once a week.   TRULICITY 1.5 WG/9.5AO SOPN Inject 1.5 mg into the skin once a week.   fluticasone (FLONASE) 50 MCG/ACT nasal spray Place 1 spray into both nostrils daily as needed for allergies or rhinitis.   glucose blood (ACCU-CHEK SMARTVIEW) test strip Use to test blood sugar 4 times daily as instructed. Dx code E11.59   No facility-administered encounter medications on file as of 02/24/2022.   Allergies  Allergen Reactions   Veltassa [Patiromer] Diarrhea   [place] Other (See Comments)    indigestion   Promethazine Hcl Other (See Comments)    hyperactivity   Spironolactone Diarrhea and Nausea And Vomiting   Plavix [Clopidogrel Bisulfate] Rash   Patient Active Problem List   Diagnosis Date Noted   Osteoarthritis 09/01/2021   DM (diabetes mellitus), type 2 with renal complications ([place]) 13/10/6576   Chronic kidney disease, stage 3b ([place]) 08/31/2021   Encounter for well adult exam with abnormal findings 07/30/2018   Polyp of ascending colon    Hypothyroidism 08/02/2017   Personal history of ventricular tachycardia 08/02/2017   Diabetic peripheral neuropathy associated with type 2 diabetes mellitus ([place]) 07/02/2017   Hyperlipidemia 07/02/2017   Hypotension 05/06/2015   Colon cancer screening 12/21/2014   Acute sinus infection 06/06/2014   Allergic rhinitis 06/06/2014   Arthritis of knee, left 04/30/2014   Knee MCL sprain 03/28/2014   Pes anserine bursitis 02/23/2014   Spinal stenosis of lumbar region 02/23/2014   Lower back pain 01/23/2014   Bilateral knee pain 01/23/2014   Left lateral abdominal pain 01/23/2014   UTI (urinary tract infection) 08/03/2013   Encounter for therapeutic drug monitoring 46/96/2952   Chronic systolic CHF (congestive heart failure) ([place]) 03/20/2013   CKD (chronic kidney disease) 03/20/2013   HTN (hypertension) 12/15/2012   Obesity, unspecified 12/15/2012   Type 2  diabetes, uncontrolled, with circulatory disorder 12/15/2012   Left knee pain 08/30/2012   Lumbar spondylosis 01/11/2012   Coronary artery disease 01/31/2011   Implantable cardioverter-defibrillator (ICD) in situ 01/27/2011     ventricular tachycardia-polymorphic 01/27/2011   Ischemic cardiomyopathy 01/27/2011   PAF (paroxysmal atrial fibrillation) ([place]) 01/27/2011   Social History   Socioeconomic History   Marital status: Divorced    Spouse name: Not on file   Number of children: 3   Years of education: 12   Highest education level: Not on file  Occupational History   Occupation: Retried  Tobacco Use   Smoking status: Former    Types: Cigarettes    Quit date: 08/16/1995    Years since quitting: 26.5   Smokeless tobacco: Never  Vaping Use   Vaping Use: Never used  Substance and Sexual Activity   Alcohol use: No   Drug use: No   Sexual activity: Never    Birth control/protection: Post-menopausal  Other Topics Concern   Not  on file  Social History Narrative   Regular exercise-no Caffeine Use- sometimes   Lives with family   Social Determinants of Health   Financial Resource Strain: Not on file  Food Insecurity: Not on file  Transportation Needs: Not on file  Physical Activity: Not on file  Stress: Not on file  Social Connections: Not on file  Intimate Partner Violence: Not on file    [name]'s family history includes Breast cancer in her sister; Colon cancer in her maternal aunt; Diabetes in her mother; Early death (age of onset: 20) in her brother; Heart attack in her father; Heart disease in her father and mother; Hyperlipidemia in her mother; Hypertension in her father and mother; Irritable bowel syndrome in her sister; Lung cancer in her sister; Seizures in her granddaughter.      Objective:    Vitals:   02/24/22 1003  BP: 130/62  Pulse: 68    Physical Exam Well-developed elderly female in no acute distress.  Height, Weight, 181 BMI 32.06 ambulates  with a walker HEENT; nontraumatic normocephalic, EOMI, PE R LA, sclera anicteric. Oropharynx; not examined today Neck; supple, no JVD Cardiovascular; regular rate and rhythm with S1-S2, no murmur rub or gallop Pulmonary; Clear bilaterally, but decreased bilaterally Abdomen; soft, nontender, nondistended, no palpable mass or hepatosplenomegaly, bowel sounds are active Rectal; not done Skin; benign exam, no jaundice rash or appreciable lesions Extremities; no clubbing cyanosis or edema skin warm and dry Neuro/Psych; alert and oriented x4, grossly nonfocal mood and affect appropriate        Assessment & Plan:   #22 76 year old female with history of tubular adenomatous polyps found at colonoscopy December 2019 per Dr. Constance Haw polyps removed largest 3 mm.  Patient comes in today to discuss possible follow-up colonoscopy. She is currently asymptomatic  #2 chronic anticoagulation-on Eliquis #3.  Atrial fibrillation #4.  History of V. tach status post ICD placement #5.  Ischemic cardiomyopathy most recent EF approximately 40% June 2023 #6.  Congestive heart failure #7.  Chronic kidney disease #8.  Seizure disorder diagnosed June 2023 with admission no recurrence-on [place]; At advanced age of 60 and with multiple significant comorbidities, patient is not an appropriate candidate for continued surveillance Colonoscopies. Patient will not be scheduled for colonoscopy, and will not plan future surveillance colonoscopies. This was discussed with the patient in detail today and she is in agreement with this plan. We are happy to see her at any time for active GI issues.  (Establishing with Dr. Bryan Lemma in Dr. Eugenia Pancoast absence)  [name] Genia Harold PA-C 02/24/2022   Cc: Rich Fuchs, PA

## 2022-03-02 NOTE — Progress Notes (Signed)
Agree with the assessment and plan as outlined by Nicoletta Ba, PA-C.  While age alone does not stop ongoing screening/surveillance colonoscopy, the combination of age, previous colonoscopy findings, significant co-morbidities are good reason to forego continued screening. Additionally, following in depth conversation with the patient regarding the pros/cons and risks/benefits of screening, patient decision to stop screening is certainly the most important factor, and in this case, the patient has clearly conveyed desire to stop screening process.   Giliana Vantil, DO, [facility]

## 2022-03-13 ENCOUNTER — Encounter: Payer: Self-pay | Admitting: Internal Medicine

## 2022-03-13 ENCOUNTER — Ambulatory Visit: Payer: Medicare HMO | Admitting: Internal Medicine

## 2022-03-13 VITALS — BP 128/82 | HR 66 | Ht 63.0 in | Wt 182.8 lb

## 2022-03-13 DIAGNOSIS — E1159 Type 2 diabetes mellitus with other circulatory complications: Secondary | ICD-10-CM

## 2022-03-13 DIAGNOSIS — Z23 Encounter for immunization: Secondary | ICD-10-CM

## 2022-03-13 DIAGNOSIS — E1165 Type 2 diabetes mellitus with hyperglycemia: Secondary | ICD-10-CM | POA: Diagnosis not present

## 2022-03-13 DIAGNOSIS — E039 Hypothyroidism, unspecified: Secondary | ICD-10-CM

## 2022-03-13 DIAGNOSIS — E1142 Type 2 diabetes mellitus with diabetic polyneuropathy: Secondary | ICD-10-CM

## 2022-03-13 DIAGNOSIS — E785 Hyperlipidemia, unspecified: Secondary | ICD-10-CM | POA: Diagnosis not present

## 2022-03-13 LAB — POCT GLYCOSYLATED HEMOGLOBIN (HGB A1C): Hemoglobin A1C: 6.2 % — AB (ref 4.0–5.6)

## 2022-03-13 MED ORDER — TRULICITY 3 MG/0.5ML ~~LOC~~ SOAJ: 3.0000 mg | SUBCUTANEOUS | 11 refills | Status: DC

## 2022-03-13 MED ORDER — TRESIBA FLEXTOUCH 100 UNIT/ML ~~LOC~~ SOPN: 12.0000 [IU] | PEN_INJECTOR | Freq: Every day | SUBCUTANEOUS | 3 refills | Status: DC

## 2022-03-13 NOTE — Patient Instructions (Addendum)
Please continue: - Jardiance 10 mg before breakfast - Trulicity 1.5 mg weekly in a.m. - Levemir/Tresiba 12 units at bedtime  Please increase: - Novolog 7-9 units before meals  When you finish the Trulicity 1.5 mg supply, increase the dose to 3 mg weekly.  Please return in 4 months with your sugar log.

## 2022-03-13 NOTE — Progress Notes (Signed)
Patient ID: [name], female   DOB: 1945/08/23, 76 y.o.   MRN: [id number]  HPI: [name] is a 76 y.o.-year-old female, presenting for f/u for DM2, insulin-dependent, uncontrolled, with complications (CAD, ICM - had ICD, peripheral neuropathy, gastroparesis). Last visit 4 months ago.  Interim history: No increased urination, blurry vision, nausea, chest pain. She has knee pain - now on gel injections, getting nerve ablation. Also, she noticed weight gain. At last visit, she was off Trulicity and Jardiance as she was in the donut hole.  She was able to start since then.  Reviewed HbA1c levels: Lab Results  Component Value Date   HGBA1C 7.1 (A) 11/11/2021   HGBA1C 6.3 (A) 07/08/2021   HGBA1C 6.7 (A) 04/02/2021  11/08/2020: HbA1c 8.5% 09/25/2020: HbA1c 10.3% 05/13/2020: HbA1c 9.6% 01/22/2020: HbA1c 8% 03/13/2019: HbA1c 7.1% 09/26/2020: Glucose 197, C-peptide 7.76 (1.1-4.4)  She is on: - Jardiance 10 mg before breakfast - tolerated well >> 500$ for 1 mo in doughnut hole (out for >1 mo) >> restarted - Levemir 25 >> 20 >> 12 units at bedtime - Novolog 7 >> 5  units before meals - Trulicity 3.53 >> 1.5 mg weekly in a.m. - no nausea >> 800$ for 1.5 mo in doughnut hole (out for >1 mo) >> restarted Metformin was stopped at the recommendation of her nephrologist in 2022. She was previously on Farxiga but stopped due to side effects (somnolence?). Lantus caused weight gain.  Pt checks her sugars 3-4 times a day: - am:  96-114, 124 >> 96, 105-145, 164 >> 101-130, 148, 201 - after b'fast: 175 >> n/c - before lunch: 85-138, 142, 191 >> 102-125 >> 110-180 >> 115-163 - after lunch:  45, 47, 140-150 >> n/c - before dinner: 88-135 >> 63, 96-120 >> 97, 130-182 >> 105-190 - bedtime:  110-360 >> 91-185, 216, 221 >> 108-123 >> 117-210, 325 >> 144-299 Lowest sugar was 58 >> 50s at night >> 63;  she has hypoglycemia awareness in the 90s. Highest sugar was 515 (steroid) >> 221 >> 325.  -+  CKD, last BUN/creatinine:   Lab Results  Component Value Date   BUN 13 01/06/2022   CREATININE 1.19 (H) 01/06/2022   GFR low -she sees nephrology: Lab Results  Component Value Date   GFRNONAA 48 (L) 01/06/2022   GFRNONAA 43 (L) 12/18/2021   GFRNONAA 34 (L) 08/31/2021   GFRNONAA 30 (L) 08/30/2021   GFRNONAA 24 (L) 06/10/2021   ACR was high at last check: 09/26/2020: ACR 55.3 Lab Results  Component Value Date   MICRALBCREAT 5.3 08/16/2018   MICRALBCREAT 9.4 04/08/2017   MICRALBCREAT 3.2 06/25/2016   MICRALBCREAT 1.6 12/21/2014   MICRALBCREAT 1.0 12/15/2012  On Entresto.  -+ HL; last set of lipids: Lab Results  Component Value Date   CHOL 125 12/18/2021   HDL 50 12/18/2021   LDLCALC 66 12/18/2021   TRIG 44 12/18/2021   CHOLHDL 2.5 12/18/2021  On Crestor 20 >> 40.  - last eye exam was in 2018: No DR  - she has numbness and tingling in her feet-on Neurontin.  She was referred to a pain clinic by her PCP.  Last foot exam 10/2021.  She is on Eliquis. On amiodarone.  She has a history of mild hypothyroidism; latest TSH was normal: Lab Results  Component Value Date   TSH 2.506 12/18/2021   TSH 2.518 08/31/2021   TSH 2.13 04/02/2021   TSH 0.223 (L) 01/14/2021   TSH 0.726 05/29/2020   TSH  3.02 08/16/2018   TSH 3.272 08/16/2018   TSH 4.468 08/02/2017   TSH 6.03 (H) 04/08/2017   TSH 4.01 06/25/2016   TSH 4.360 01/07/2016   TSH 2.745 07/30/2015   TSH 3.29 12/21/2014   TSH 2.697 08/08/2014   TSH 3.620 12/05/2013   TSH 1.72 05/17/2013   TSH 2.83 04/03/2013   TSH 1.91 11/10/2012   TSH 1.72 07/18/2012   TSH 2.14 04/06/2012   She was on levothyroxine 25 mcg daily, but currently off.  She has no family history of medullary thyroid cancer or personal history of pancreatitis. Her 69 y/o son passed away in 2018/06/25 - had cirrhosis >> developed cough, fever, generalized pain+ bilateral PNA (Covid 19 negative).   Her nephew died in 07/26/2018 and her brother died 11-25-2018.     She saw endocrinology at [facility], with the last office visit in 09/2020.  Her diabetic regimen was changed at that time (Trulicity and Jardiance added).  Sugars improved.  ROS: + see HPI  I reviewed pt's medications, allergies, PMH, social hx, family hx, and changes were documented in the history of present illness. Otherwise, unchanged from my initial visit note.  Past Medical History:  Diagnosis Date   Allergy    Arthritis    Atrial fibrillation ([place])    controlled with amiodarone, on coumadin   Chronic renal insufficiency    Chronic systolic heart failure (HCC)    Congestive heart failure ([place])    Coronary artery disease 01/31/2011   Diabetes mellitus    type 1   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    History of chicken pox    Hyperlipidemia    Hypertension    Ischemic cardiomyopathy    Lumbar spondylosis 01/11/2012   Sleep apnea    Stroke [facility]) 2010   eye doctor said she had TIA   Ventricular tachycardia ([place])    Polymorphic   Past Surgical History:  Procedure Laterality Date   [place]   COLONOSCOPY WITH PROPOFOL N/A 02/24/2018   Procedure: COLONOSCOPY WITH PROPOFOL;  Surgeon: Milus Banister, MD;  Location: [facility] ENDOSCOPY;  Service: Endoscopy;  Laterality: N/A;   ICD  2004-Apr-2007   implanted by Dr Rollene Fare, most recent generator change 2/13 by Dr Rayann Heman, Analyze ST study patient   ICD GENERATOR CHANGEOUT N/A 04/11/2020   Procedure: [place];  Surgeon: Thompson Grayer, MD;  Location: [place] CV LAB;  Service: Cardiovascular;  Laterality: N/A;   IMPLANTABLE CARDIOVERTER DEFIBRILLATOR (ICD) GENERATOR CHANGE N/A 05/05/2011   Procedure: ICD GENERATOR CHANGE;  Surgeon: Thompson Grayer, MD;  Location[facility] C[facility] LAB;  Service: Cardiovascular;  Laterality: N/A;   LEFT HEART CATHETERIZATION WITH CORONARY ANGIOGRAM N/A 01/30/2011   Procedure: LEFT HEART  CATHETERIZATION WITH CORONARY ANGIOGRAM;  Surgeon: Larey Dresser, MD;  Location: [facility] CATH LAB;  Service: Cardiovascular;  Laterality: N/A;   POLYPECTOMY  02/24/2018   Procedure: POLYPECTOMY;  Surgeon: Milus Banister, MD;  Location: [facility] ENDOSCOPY;  Service: Endoscopy;;   [place] History   Socioeconomic History   Marital status: Divorced    Spouse name: Not on file   Number of children: 3   Years of education: 12   Highest education level: Not on file  Occupational History   Occupation: Retried  Tobacco Use   Smoking status: Former    Types: Cigarettes    Quit date: 08/16/1995  Years since quitting: 26.5   Smokeless tobacco: Never  Vaping Use   Vaping Use: Never used  Substance and Sexual Activity   Alcohol use: No   Drug use: No   Sexual activity: Never    Birth control/protection: Post-menopausal  Other Topics Concern   Not on file  Social History Narrative   Regular exercise-no Caffeine Use- sometimes   Lives with family   Social Determinants of Health   Financial Resource Strain: Not on file  Food Insecurity: Not on file  Transportation Needs: Not on file  Physical Activity: Not on file  Stress: Not on file  Social Connections: Not on file  Intimate Partner Violence: Not on file   Current Outpatient Medications on File Prior to Visit  Medication Sig Dispense Refill   acetaminophen (TYLENOL) 325 MG tablet Take 2 tablets (650 mg total) by mouth every 4 (four) hours as needed for mild pain or moderate pain.     amiodarone (PACERONE) 100 MG tablet Take 1 tablet (100 mg total) by mouth daily. (Patient taking differently: Take 100 mg by mouth every morning.) 90 tablet 3   Blood Glucose Monitoring Suppl (ACCU-CHEK NANO SMARTVIEW) W/DEVICE KIT Use to test blood sugar 4 times daily as instructed. Dx code: E11.59 1 kit 0   busPIRone (BUSPAR) 5 MG tablet Take 1 tablet (5 mg total) by mouth 2 (two) times daily. 60 tablet 1   carvedilol (COREG) 12.5 MG  tablet TAKE 1 TABLET (12.5 MG TOTAL) BY MOUTH 2 (TWO) TIMES DAILY WITH A MEAL. 180 tablet 3   Cholecalciferol (VITAMIN D3) 2000 UNITS TABS Take 2,000 Units by mouth every morning.     dicyclomine (BENTYL) 10 MG capsule TAKE 1 CAPSULE (10 MG TOTAL) BY MOUTH 4 (FOUR) TIMES DAILY BEFORE MEALS AND AT BEDTIME. 120 capsule 1   ELIQUIS 5 MG TABS tablet TAKE 1 TABLET BY MOUTH TWICE A DAY 60 tablet 11   empagliflozin (JARDIANCE) 10 MG TABS tablet Take 1 tablet (10 mg total) by mouth every morning. 90 tablet 3   fluticasone (FLONASE) 50 MCG/ACT nasal spray Place 1 spray into both nostrils daily as needed for allergies or rhinitis.     furosemide (LASIX) 20 MG tablet TAKE 1 TABLET BY MOUTH EVERY DAY 90 tablet 1   gabapentin (NEURONTIN) 100 MG capsule TAKE 2 CAPSULES BY MOUTH TWICE A DAY 360 capsule 0   glucose blood (ACCU-CHEK SMARTVIEW) test strip Use to test blood sugar 4 times daily as instructed. Dx code E11.59 200 each 11   insulin aspart (NOVOLOG FLEXPEN) 100 UNIT/ML FlexPen Inject up to 15 units daily under skin as advised (Patient taking differently: Inject 4-7 Units into the skin 3 (three) times daily before meals. Per sliding scale) 15 mL 11   insulin detemir (LEVEMIR FLEXTOUCH) 100 UNIT/ML FlexPen Inject 20 Units into the skin daily. (Patient taking differently: Inject 20 Units into the skin at bedtime.) 15 mL 11   levETIRAcetam (KEPPRA) 250 MG tablet TAKE 1 TABLET BY MOUTH TWICE A DAY 180 tablet 1   magnesium oxide (MAG-OX) 400 (240 Mg) MG tablet TAKE 1 TABLET BY MOUTH TWICE A DAY 180 tablet 1   Multiple Vitamins-Minerals (EYE VITAMINS PO) Take 1 tablet by mouth every morning.     nitroGLYCERIN (NITROSTAT) 0.4 MG SL tablet Place 1 tablet (0.4 mg total) under the tongue every 5 (five) minutes as needed for chest pain. 25 tablet 1   NOVOLOG 100 UNIT/ML injection INJECT 10-16 UNITS INTO THE SKIN 3 (THREE)  TIMES DAILY WITH MEALS. 10 mL 20   omeprazole (PRILOSEC OTC) 20 MG tablet Take 20 mg by mouth  every morning.     rosuvastatin (CRESTOR) 40 MG tablet TAKE 1 TABLET BY MOUTH EVERY DAY 90 tablet 3   sacubitril-valsartan (ENTRESTO) 97-103 MG Take 1 tablet by mouth 2 (two) times daily. 60 tablet 11   Semaglutide,0.25 or 0.5MG/DOS, (OZEMPIC, 0.25 OR 0.5 MG/DOSE,) 2 MG/3ML SOPN Inject 0.5 mg into the skin once a week. 3 mL 2   TRULICITY 1.5 AO/1.3YQ SOPN Inject 1.5 mg into the skin once a week.     No current facility-administered medications on file prior to visit.   Allergies  Allergen Reactions   Veltassa [Patiromer] Diarrhea   [place] Other (See Comments)    indigestion   Promethazine Hcl Other (See Comments)    hyperactivity   Spironolactone Diarrhea and Nausea And Vomiting   Plavix [Clopidogrel Bisulfate] Rash   Family History  Problem Relation Age of Onset   Diabetes Mother    Heart disease Mother    Hyperlipidemia Mother    Hypertension Mother    Heart disease Father    Heart attack Father    Hypertension Father    Breast cancer Sister    Lung cancer Sister    Irritable bowel syndrome Sister    Early death Brother 32   Colon cancer Maternal Aunt    Seizures Granddaughter    Esophageal cancer Neg Hx    Colon polyps Neg Hx    PE: BP 128/82 (BP Location: Left Arm, Patient Position: Sitting, Cuff Size: Normal)   Pulse 66   Ht _0  (1.6 m)   Wt 182 lb 12.8 oz (82.9 kg)   SpO2 98%   BMI 32.38 kg/m    Wt Readings from Last 3 Encounters:  03/13/22 182 lb 12.8 oz (82.9 kg)  02/24/22 181 lb (82.1 kg)  12/18/21 177 lb 9.6 oz (80.6 kg)   Constitutional: overweight, in NAD, walks with a walker Eyes: EOMI, no exophthalmos ENT: no thyromegaly, no cervical lymphadenopathy Cardiovascular: RRR, No MRG, + B pitting edema Respiratory: CTA B Musculoskeletal: no deformities Skin:  no rashes Neurological: + Very mild tremor with outstretched hands  ASSESSMENT: 1. DM2, insulin-dependent, controlled, with complications - CAD - s/p stent - ICM with CHF - s/p ICD  placement - A fib - s/p pacemaker - CKD - PN - on neurontin - gastroparesis per [facility] 06/18/2012 >> 60 minutes: 92%, 120 minutes: 82%  2. HL  3.  History of hypothyroidism  PLAN:  1. Patient with longstanding, uncontrolled, type 2 diabetes, on SGLT2 inhibitor, weekly GLP-1 receptor, long-acting insulin and prn rapid acting insulin before large meals only, with worsening control.  Latest HbA1c was 7.1% at last visit.  Time, she was off Jardiance and Trulicity as she was not able to afford them we discussed about restarting these through patient assistance.  She did have some low blood sugars overnight, in the 50s so I advised her to increase Levemir dose. -At today's visit, sugars have improved in the morning, but they are still above target especially as the day goes by, with the highest values after dinner.  We discussed that this is evidence of not enough mealtime coverage.  She mentions that she takes only 5 units of NovoLog and we discussed about increasing the dose.  However, since she describes weight gain, also, I recommended to increase the dose of Trulicity to 3 mg weekly.  Since she just refilled  to 1.5 mg dose, we can continue on this for now and increase the NovoLog, but I advised her that after she switches to 3 mg weekly, she may need to decrease the doses of NovoLog.  For now, we can keep the long-acting insulin dose the same.  We need to switch from Levemir to [place] due to availability.  She would like to avoid Lantus, as this caused weight gain in the past. - I suggested to: Patient Instructions  Please continue: - Jardiance 10 mg before breakfast - Trulicity 1.5 mg weekly in a.m. - Levemir/Tresiba 12 units at bedtime  Please increase: - Novolog 7-9 units before meals  When you finish the Trulicity 1.5 mg supply, increase the dose to 3 mg weekly.  Please return in 4 months with your sugar log.   - we checked her HbA1c: 6.2% (lower) - advised to check sugars at different  times of the day - 1-2x a day, rotating check times - advised for yearly eye exams >> she is UTD - return to clinic in 4 months  2. HL -Due to length of treatment from 11/2021: LDL above our target of less than 55 due to history of cardiovascular disease, the rest of the fractions at goal: Lab Results  Component Value Date   CHOL 125 12/18/2021   HDL 50 12/18/2021   LDLCALC 66 12/18/2021   TRIG 44 12/18/2021   CHOLHDL 2.5 12/18/2021  -She continues on Crestor 40 mg daily without side effects  3.  History of hypothyroidism -Possibly related to amiodarone -She was deviously on a low-dose levothyroxine, 25 mcg daily, but the TSH returned suppressed in 12/2020 so we stopped the hormone. -Subsequent TFTs were normal, including at last check: Lab Results  Component Value Date   TSH 2.506 12/18/2021  -Will continue to keep an eye on this, but no intervention is needed for now  Philemon Kingdom, MD PhD [facility]

## 2022-03-25 ENCOUNTER — Encounter: Payer: Medicare HMO | Admitting: Cardiology

## 2022-04-02 ENCOUNTER — Ambulatory Visit: Payer: Medicare HMO | Admitting: Neurology

## 2022-04-15 ENCOUNTER — Ambulatory Visit: Payer: Medicare HMO | Attending: Cardiology

## 2022-04-15 DIAGNOSIS — I255 Ischemic cardiomyopathy: Secondary | ICD-10-CM | POA: Diagnosis not present

## 2022-04-15 LAB — CUP PACEART REMOTE DEVICE CHECK
Battery Remaining Longevity: 70 mo
Battery Remaining Percentage: 77 %
Battery Voltage: 3.01 V
Brady Statistic AP VP Percent: 7.7 %
Brady Statistic AP VS Percent: 74 %
Brady Statistic AS VP Percent: 2 %
Brady Statistic AS VS Percent: 12 %
Brady Statistic RA Percent Paced: 73 %
Brady Statistic RV Percent Paced: 11 %
Date Time Interrogation Session: 20240124033942
HighPow Impedance: 49 Ohm
HighPow Impedance: 49 Ohm
Implantable Lead Connection Status: 753985
Implantable Lead Connection Status: 753985
Implantable Lead Implant Date: 20070117
Implantable Lead Implant Date: 20070117
Implantable Lead Location: 753859
Implantable Lead Location: 753860
Implantable Lead Model: 7040
Implantable Pulse Generator Implant Date: 20220120
Lead Channel Impedance Value: 510 Ohm
Lead Channel Impedance Value: 510 Ohm
Lead Channel Pacing Threshold Amplitude: 0.75 V
Lead Channel Pacing Threshold Amplitude: 0.75 V
Lead Channel Pacing Threshold Pulse Width: 0.5 ms
Lead Channel Pacing Threshold Pulse Width: 0.5 ms
Lead Channel Sensing Intrinsic Amplitude: 11.8 mV
Lead Channel Sensing Intrinsic Amplitude: 4.8 mV
Lead Channel Setting Pacing Amplitude: 2.5 V
Lead Channel Setting Pacing Amplitude: 2.5 V
Lead Channel Setting Pacing Pulse Width: 0.5 ms
Lead Channel Setting Sensing Sensitivity: 0.5 mV
Pulse Gen Serial Number: 9976817

## 2022-05-07 NOTE — Progress Notes (Signed)
Remote ICD transmission.   

## 2022-05-21 ENCOUNTER — Other Ambulatory Visit: Payer: Self-pay | Admitting: Internal Medicine

## 2022-06-01 ENCOUNTER — Encounter (HOSPITAL_COMMUNITY): Payer: Medicare HMO

## 2022-06-11 ENCOUNTER — Other Ambulatory Visit: Payer: Self-pay

## 2022-06-11 ENCOUNTER — Inpatient Hospital Stay (HOSPITAL_COMMUNITY)
Admission: EM | Admit: 2022-06-11 | Discharge: 2022-06-15 | DRG: 309 | Disposition: A | Discharge status: Home / Self Care | Payer: Medicare HMO | Attending: Cardiology | Admitting: Cardiology

## 2022-06-11 ENCOUNTER — Emergency Department (HOSPITAL_COMMUNITY): Payer: Medicare HMO

## 2022-06-11 ENCOUNTER — Telehealth: Payer: Self-pay

## 2022-06-11 DIAGNOSIS — M47816 Spondylosis without myelopathy or radiculopathy, lumbar region: Secondary | ICD-10-CM | POA: Diagnosis present

## 2022-06-11 DIAGNOSIS — I4891 Unspecified atrial fibrillation: Secondary | ICD-10-CM | POA: Diagnosis not present

## 2022-06-11 DIAGNOSIS — R000 Tachycardia, unspecified: Principal | ICD-10-CM

## 2022-06-11 DIAGNOSIS — Z7984 Long term (current) use of oral hypoglycemic drugs: Secondary | ICD-10-CM | POA: Diagnosis not present

## 2022-06-11 DIAGNOSIS — Z90710 Acquired absence of both cervix and uterus: Secondary | ICD-10-CM

## 2022-06-11 DIAGNOSIS — E875 Hyperkalemia: Secondary | ICD-10-CM | POA: Diagnosis present

## 2022-06-11 DIAGNOSIS — Z87891 Personal history of nicotine dependence: Secondary | ICD-10-CM | POA: Diagnosis not present

## 2022-06-11 DIAGNOSIS — I472 Ventricular tachycardia, unspecified: Secondary | ICD-10-CM | POA: Diagnosis not present

## 2022-06-11 DIAGNOSIS — Z95810 Presence of automatic (implantable) cardiac defibrillator: Secondary | ICD-10-CM | POA: Diagnosis not present

## 2022-06-11 DIAGNOSIS — E1122 Type 2 diabetes mellitus with diabetic chronic kidney disease: Secondary | ICD-10-CM | POA: Diagnosis present

## 2022-06-11 DIAGNOSIS — I428 Other cardiomyopathies: Secondary | ICD-10-CM | POA: Diagnosis present

## 2022-06-11 DIAGNOSIS — I130 Hypertensive heart and chronic kidney disease with heart failure and stage 1 through stage 4 chronic kidney disease, or unspecified chronic kidney disease: Secondary | ICD-10-CM | POA: Diagnosis present

## 2022-06-11 DIAGNOSIS — Z9851 Tubal ligation status: Secondary | ICD-10-CM

## 2022-06-11 DIAGNOSIS — I4729 Other ventricular tachycardia: Secondary | ICD-10-CM

## 2022-06-11 DIAGNOSIS — Z955 Presence of coronary angioplasty implant and graft: Secondary | ICD-10-CM

## 2022-06-11 DIAGNOSIS — N179 Acute kidney failure, unspecified: Secondary | ICD-10-CM | POA: Diagnosis present

## 2022-06-11 DIAGNOSIS — I255 Ischemic cardiomyopathy: Secondary | ICD-10-CM | POA: Diagnosis present

## 2022-06-11 DIAGNOSIS — Z7901 Long term (current) use of anticoagulants: Secondary | ICD-10-CM

## 2022-06-11 DIAGNOSIS — Z794 Long term (current) use of insulin: Secondary | ICD-10-CM

## 2022-06-11 DIAGNOSIS — Z7985 Long-term (current) use of injectable non-insulin antidiabetic drugs: Secondary | ICD-10-CM

## 2022-06-11 DIAGNOSIS — Z79899 Other long term (current) drug therapy: Secondary | ICD-10-CM

## 2022-06-11 DIAGNOSIS — G4730 Sleep apnea, unspecified: Secondary | ICD-10-CM | POA: Diagnosis present

## 2022-06-11 DIAGNOSIS — E039 Hypothyroidism, unspecified: Secondary | ICD-10-CM | POA: Diagnosis present

## 2022-06-11 DIAGNOSIS — N1832 Chronic kidney disease, stage 3b: Secondary | ICD-10-CM | POA: Diagnosis present

## 2022-06-11 DIAGNOSIS — E785 Hyperlipidemia, unspecified: Secondary | ICD-10-CM | POA: Diagnosis present

## 2022-06-11 DIAGNOSIS — I4892 Unspecified atrial flutter: Secondary | ICD-10-CM | POA: Diagnosis present

## 2022-06-11 DIAGNOSIS — I2510 Atherosclerotic heart disease of native coronary artery without angina pectoris: Secondary | ICD-10-CM | POA: Diagnosis present

## 2022-06-11 DIAGNOSIS — I480 Paroxysmal atrial fibrillation: Secondary | ICD-10-CM | POA: Diagnosis present

## 2022-06-11 DIAGNOSIS — Z8719 Personal history of other diseases of the digestive system: Secondary | ICD-10-CM

## 2022-06-11 DIAGNOSIS — E1143 Type 2 diabetes mellitus with diabetic autonomic (poly)neuropathy: Secondary | ICD-10-CM | POA: Diagnosis present

## 2022-06-11 DIAGNOSIS — D649 Anemia, unspecified: Secondary | ICD-10-CM | POA: Diagnosis present

## 2022-06-11 DIAGNOSIS — Z9049 Acquired absence of other specified parts of digestive tract: Secondary | ICD-10-CM

## 2022-06-11 DIAGNOSIS — I5022 Chronic systolic (congestive) heart failure: Secondary | ICD-10-CM | POA: Diagnosis present

## 2022-06-11 DIAGNOSIS — Z8249 Family history of ischemic heart disease and other diseases of the circulatory system: Secondary | ICD-10-CM

## 2022-06-11 DIAGNOSIS — Z888 Allergy status to other drugs, medicaments and biological substances status: Secondary | ICD-10-CM

## 2022-06-11 DIAGNOSIS — Z8673 Personal history of transient ischemic attack (TIA), and cerebral infarction without residual deficits: Secondary | ICD-10-CM

## 2022-06-11 DIAGNOSIS — Z833 Family history of diabetes mellitus: Secondary | ICD-10-CM

## 2022-06-11 LAB — CBC WITH DIFFERENTIAL/PLATELET
Abs Immature Granulocytes: 0.01 10*3/uL (ref 0.00–0.07)
Basophils Absolute: 0 10*3/uL (ref 0.0–0.1)
Basophils Relative: 0 %
Eosinophils Absolute: 0.1 10*3/uL (ref 0.0–0.5)
Eosinophils Relative: 2 %
HCT: 37.6 % (ref 36.0–46.0)
Hemoglobin: 11.6 g/dL — ABNORMAL LOW (ref 12.0–15.0)
Immature Granulocytes: 0 %
Lymphocytes Relative: 21 %
Lymphs Abs: 1.4 10*3/uL (ref 0.7–4.0)
MCH: 29.7 pg (ref 26.0–34.0)
MCHC: 30.9 g/dL (ref 30.0–36.0)
MCV: 96.2 fL (ref 80.0–100.0)
Monocytes Absolute: 0.5 10*3/uL (ref 0.1–1.0)
Monocytes Relative: 7 %
Neutro Abs: 4.5 10*3/uL (ref 1.7–7.7)
Neutrophils Relative %: 70 %
Platelets: 190 10*3/uL (ref 150–400)
RBC: 3.91 MIL/uL (ref 3.87–5.11)
RDW: 16.4 % — ABNORMAL HIGH (ref 11.5–15.5)
WBC: 6.5 10*3/uL (ref 4.0–10.5)
nRBC: 0 % (ref 0.0–0.2)

## 2022-06-11 LAB — COMPREHENSIVE METABOLIC PANEL
ALT: 11 U/L (ref 0–44)
AST: 16 U/L (ref 15–41)
Albumin: 3.2 g/dL — ABNORMAL LOW (ref 3.5–5.0)
Alkaline Phosphatase: 69 U/L (ref 38–126)
Anion gap: 11 (ref 5–15)
BUN: 47 mg/dL — ABNORMAL HIGH (ref 8–23)
CO2: 20 mmol/L — ABNORMAL LOW (ref 22–32)
Calcium: 8.4 mg/dL — ABNORMAL LOW (ref 8.9–10.3)
Chloride: 106 mmol/L (ref 98–111)
Creatinine, Ser: 2.33 mg/dL — ABNORMAL HIGH (ref 0.44–1.00)
GFR, Estimated: 21 mL/min — ABNORMAL LOW (ref >60)
Glucose, Bld: 273 mg/dL — ABNORMAL HIGH (ref 70–99)
Potassium: 5.8 mmol/L — ABNORMAL HIGH (ref 3.5–5.1)
Sodium: 137 mmol/L (ref 135–145)
Total Bilirubin: 0.6 mg/dL (ref 0.3–1.2)
Total Protein: 6.2 g/dL — ABNORMAL LOW (ref 6.5–8.1)

## 2022-06-11 LAB — TSH: TSH: 3.64 u[IU]/mL (ref 0.350–4.500)

## 2022-06-11 LAB — MRSA NEXT GEN BY PCR, NASAL: MRSA by PCR Next Gen: NOT DETECTED

## 2022-06-11 LAB — GLUCOSE, CAPILLARY
Glucose-Capillary: 182 mg/dL — ABNORMAL HIGH (ref 70–99)
Glucose-Capillary: 159 mg/dL — ABNORMAL HIGH (ref 70–99)

## 2022-06-11 LAB — LIPASE, BLOOD: Lipase: 35 U/L (ref 11–51)

## 2022-06-11 LAB — MAGNESIUM: Magnesium: 2.6 mg/dL — ABNORMAL HIGH (ref 1.7–2.4)

## 2022-06-11 MED ORDER — ETOMIDATE 2 MG/ML IV SOLN
INTRAVENOUS | Status: AC
  Administered 2022-06-11: 6 mg via INTRAVENOUS
  Filled 2022-06-11: qty 10

## 2022-06-11 MED ORDER — ETOMIDATE 2 MG/ML IV SOLN: 6.0000 mg | Freq: Once | INTRAVENOUS | Status: AC

## 2022-06-11 MED ORDER — GABAPENTIN 100 MG PO CAPS
200.0000 mg | ORAL_CAPSULE | Freq: Two times a day (BID) | ORAL | Status: DC
  Administered 2022-06-11 – 2022-06-15 (×8): 200 mg via ORAL
  Filled 2022-06-11 (×8): qty 2

## 2022-06-11 MED ORDER — AMIODARONE LOAD VIA INFUSION
150.0000 mg | Freq: Once | INTRAVENOUS | Status: AC
  Administered 2022-06-11: 150 mg via INTRAVENOUS
  Filled 2022-06-11: qty 83.34

## 2022-06-11 MED ORDER — ROSUVASTATIN CALCIUM 20 MG PO TABS
40.0000 mg | ORAL_TABLET | Freq: Every day | ORAL | Status: DC
  Administered 2022-06-11 – 2022-06-13 (×3): 40 mg via ORAL
  Filled 2022-06-11 (×5): qty 2

## 2022-06-11 MED ORDER — PANTOPRAZOLE SODIUM 40 MG PO TBEC
40.0000 mg | DELAYED_RELEASE_TABLET | Freq: Every morning | ORAL | Status: DC
  Administered 2022-06-12 – 2022-06-15 (×4): 40 mg via ORAL
  Filled 2022-06-11 (×4): qty 1

## 2022-06-11 MED ORDER — ETOMIDATE 2 MG/ML IV SOLN: 8.0000 mg | Freq: Once | INTRAVENOUS | Status: DC

## 2022-06-11 MED ORDER — INSULIN ASPART 100 UNIT/ML IJ SOLN
0.0000 [IU] | Freq: Three times a day (TID) | INTRAMUSCULAR | Status: DC
  Administered 2022-06-11 – 2022-06-12 (×2): 3 [IU] via SUBCUTANEOUS

## 2022-06-11 MED ORDER — AMIODARONE HCL IN DEXTROSE 360-4.14 MG/200ML-% IV SOLN
60.0000 mg/h | INTRAVENOUS | Status: DC
  Administered 2022-06-11 (×2): 60 mg/h via INTRAVENOUS

## 2022-06-11 MED ORDER — SODIUM CHLORIDE 0.9 % IV BOLUS
500.0000 mL | Freq: Once | INTRAVENOUS | Status: AC
  Administered 2022-06-11: 500 mL via INTRAVENOUS

## 2022-06-11 MED ORDER — MAGNESIUM OXIDE -MG SUPPLEMENT 400 (240 MG) MG PO TABS
400.0000 mg | ORAL_TABLET | Freq: Two times a day (BID) | ORAL | Status: DC
  Administered 2022-06-11 – 2022-06-15 (×8): 400 mg via ORAL
  Filled 2022-06-11 (×8): qty 1

## 2022-06-11 MED ORDER — LEVETIRACETAM 250 MG PO TABS
250.0000 mg | ORAL_TABLET | Freq: Two times a day (BID) | ORAL | Status: DC
  Administered 2022-06-11 – 2022-06-15 (×8): 250 mg via ORAL
  Filled 2022-06-11 (×8): qty 1

## 2022-06-11 MED ORDER — APIXABAN 5 MG PO TABS
5.0000 mg | ORAL_TABLET | Freq: Two times a day (BID) | ORAL | Status: DC
  Administered 2022-06-11 – 2022-06-15 (×8): 5 mg via ORAL
  Filled 2022-06-11 (×2): qty 1
  Filled 2022-06-11: qty 2
  Filled 2022-06-11 (×5): qty 1

## 2022-06-11 MED ORDER — ACETAMINOPHEN 325 MG PO TABS
650.0000 mg | ORAL_TABLET | ORAL | Status: DC | PRN
  Administered 2022-06-13: 650 mg via ORAL
  Filled 2022-06-11: qty 2

## 2022-06-11 MED ORDER — SODIUM ZIRCONIUM CYCLOSILICATE 10 G PO PACK
10.0000 g | PACK | Freq: Once | ORAL | Status: AC
  Administered 2022-06-11: 10 g via ORAL
  Filled 2022-06-11: qty 1

## 2022-06-11 MED ORDER — NITROGLYCERIN 0.4 MG SL SUBL: 0.4000 mg | SUBLINGUAL_TABLET | SUBLINGUAL | Status: DC | PRN

## 2022-06-11 MED ORDER — ASPIRIN 81 MG PO TBEC
81.0000 mg | DELAYED_RELEASE_TABLET | Freq: Every day | ORAL | Status: DC
  Administered 2022-06-12 – 2022-06-13 (×2): 81 mg via ORAL
  Filled 2022-06-11 (×3): qty 1

## 2022-06-11 MED ORDER — AMIODARONE HCL IN DEXTROSE 360-4.14 MG/200ML-% IV SOLN
30.0000 mg/h | INTRAVENOUS | Status: DC
  Administered 2022-06-11 – 2022-06-12 (×2): 30 mg/h via INTRAVENOUS
  Filled 2022-06-11: qty 200
  Filled 2022-06-11: qty 400

## 2022-06-11 MED ORDER — CARVEDILOL 12.5 MG PO TABS
12.5000 mg | ORAL_TABLET | Freq: Two times a day (BID) | ORAL | Status: DC
  Administered 2022-06-11 – 2022-06-15 (×8): 12.5 mg via ORAL
  Filled 2022-06-11 (×8): qty 1

## 2022-06-11 MED ORDER — BUSPIRONE HCL 5 MG PO TABS
5.0000 mg | ORAL_TABLET | Freq: Two times a day (BID) | ORAL | Status: DC
  Administered 2022-06-11 – 2022-06-15 (×8): 5 mg via ORAL
  Filled 2022-06-11 (×8): qty 1

## 2022-06-11 NOTE — Consult Note (Addendum)
Cardiology Consultation   Patient ID: [name] MRN: [id number]; DOB: 04/16/1945  Admit date: 06/11/2022 Date of Consult: 06/11/2022  PCP:  Kathyrn Lass   [facility] Cardiologist:  Loralie Champagne, MD  Electrophysiologist:  Dr. Rayann Heman >> pending new EP MD, Dr. Curt Bears }     Patient Profile:   [name] is a 77 y.o. female with a hx of  CAD (s/p LAD and RCA PCI.  Last LHC in 11/12 with patent proximal LAD stent, ostial 70% D1 (jailed by stent), mild LAD stent patent, patent RCA stents), ICM, chronic CHF, VT, ICD, AFib, stroke, DM, HTN, HLD, CKD (III), gastroparesis, Hypothyroidism.  who is being seen 06/11/2022 for the evaluation of VT at the request of Dr. Laverta Baltimore.   Device information SJM dual chamber ICD, implanted 2003,  2007, gen changes 2013, Jan 2022 2003 had syncope >> EP study with inducible PMVT + h/o PMVT 2012 with ICD shocks 04/24/21 appropriate tx for VT w/HV tx   At the time of her gen change Jan 2022,  SJM Riata ST 7040 lead was fluoroscopically and electrically normal today, opted to use this lead rather than replacing it after a long discussion with the patient prior to the procedure.     AAD Hx 2007 amiodarone started quickly stopped 2/2 nausea Remotely as well tried on Sotalol and Multaq unclear when/why they were stopped 2012 restarted on amiodarone  History of Present Illness:   [name] reports in her USOH when our office called that she was in VT.  She denies feeling any kind of cardiac awareness, no CO, palpitations,  For a couple days she has felt more tired, maybe weak, slightly lightheaded, no overt dizziness, near syncope or syncope.  Last week she had a day of mild GI symptoms, did vomit twice and had some loose stools, both resolved, no overt fever.  She reports excellent medication compliance no missed doses, took her meds this morning, including her Eliquis.  Device clinic in our office received an alert for  treated VT episodes, noted that she has been in/out of BOTH AF and VT, had gotten 22 ATP therapies, no HV Therapies. Her presenting rhythm clearly remained in VT rate 140'sbpm, below detection  Here she is feeling a little tired, no CP, no SOB, occasionally she tells me has the need to take a deep breath in, though  not overtly SOB.  I had opportunity to review EKG and this AM transmission presenting EGM with Dr. Curt Bears, recommends d/w ER MD< to get her cardioverted and further management from there pending , labs, ect,.  I d/w ER MD, her BP is dipping some, last SBP 79, MAP 67, extremities are warm, she is mentating well, not in distress  I discussed the recommendation for cardioversion with the patient, the procedure, potential risks/benefits, she is agreeable Dr. Laverta Baltimore bedside  Labs are pending  ADDEND Labs back K+ 5.8 BUN/Creat 47/2.33  Mag 2.6 WBC 6.5 H/H 11/37 Plts 190 TSH 3.640    Past Medical History:  Diagnosis Date   Allergy    Arthritis    Atrial fibrillation ([place])    controlled with amiodarone, on coumadin   Chronic renal insufficiency    Chronic systolic heart failure (H[facility])    Congestive heart failure ([place])    Coronary artery disease 01/31/2011   Diabetes mellitus    type 1   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    Past Surgical History:  Procedure Laterality Date   [place]   COLONOSCOPY WITH PROPOFOL N/A 02/24/2018   Procedure: COLONOSCOPY WITH PROPOFOL;  Surgeon: Milus Banister, MD;  Location: [facility];  Service: Endoscopy;  Laterality: N/A;   ICD  2003/2007   implanted by Dr Rollene Fare, most recent generator change 2/13  by Dr Rayann Heman, Analyze ST study patient   ICD GENERATOR CHANGEOUT N/A 04/11/2020   Procedure: [place];  Surgeon: Thompson Grayer, MD;  Location: [facility];  Service: Cardiovascular;  Laterality: N/A;   IMPLANTABLE CARDIOVERTER DEFIBRILLATOR (ICD) GENERATOR CHANGE N/A 05/05/2011   Procedure: ICD GENERATOR CHANGE;  Surgeon: Thompson Grayer, MD;  Location: [facility] CATH LAB;  Service: Cardiovascular;  Laterality: N/A;   LEFT HEART CATHETERIZATION WITH CORONARY ANGIOGRAM N/A 01/30/2011   Procedure: LEFT HEART CATHETERIZATION WITH CORONARY ANGIOGRAM;  Surgeon: Larey Dresser, MD;  Location: [facility] C[facility] LAB;  Service: Cardiovascular;  Laterality: N/A;   POLYPECTOMY  02/24/2018   Procedure: POLYPECTOMY;  Surgeon: Milus Banister, MD;  Location: [facility];  Service: Endoscopy;;   [place] Medications:  Prior to Admission medications   Medication Sig Start Date End Date Taking? Authorizing Provider  acetaminophen (TYLENOL) 325 MG tablet Take 2 tablets (650 mg total) by mouth every 4 (four) hours as needed for mild pain or moderate pain. 09/03/21   Elgergawy, Silver Huguenin, MD  amiodarone (PACERONE) 100 MG tablet Take 1 tablet (100 mg total) by mouth daily. Patient taking differently: Take 100 mg by mouth every morning. 08/19/21   Larey Dresser, MD  Blood Glucose Monitoring Suppl (ACCU-CHEK NANO SMARTVIEW) W/DEVICE KIT Use to test blood sugar 4 times daily as instructed. Dx code: E11.59 03/01/14   Philemon Kingdom, MD  busPIRone (BUSPAR) 5 MG tablet Take 1 tablet (5 mg total) by mouth 2 (two) times daily. 05/20/21   [place]ford, Maricela Bo, FNP  carvedilol (COREG) 12.5 MG tablet TAKE 1 TABLET (12.5 MG TOTAL) BY MOUTH 2 (TWO) TIMES DAILY WITH A MEAL. 10/31/21   Larey Dresser, MD  Cholecalciferol (VITAMIN D3) 2000 UNITS TABS Take 2,000 Units by mouth every morning.    [provider]  dicyclomine (BENTYL) 10 MG capsule TAKE 1 CAPSULE (10 MG TOTAL) BY MOUTH 4 (FOUR) TIMES  DAILY BEFORE MEALS AND AT BEDTIME. 02/09/20   Biagio Borg, MD  Dulaglutide (TRULICITY) 3 0000000 SOPN Inject 3 mg into the skin once a week. 03/13/22   Philemon Kingdom, MD  ELIQUIS 5 MG TABS tablet TAKE 1 TABLET BY MOUTH TWICE A DAY 12/22/21   Larey Dresser, MD  empagliflozin (JARDIANCE) 10 MG TABS tablet Take 1 tablet (10 mg total) by mouth every morning. 11/11/21   Philemon Kingdom, MD  fluticasone (FLONASE) 50 MCG/ACT nasal spray Place 1 spray into both nostrils daily as needed for allergies or rhinitis. 03/13/19 10/09/21  [provider]  furosemide (LASIX) 20 MG tablet TAKE 1 TABLET BY MOUTH EVERY DAY 01/15/22   Larey Dresser, MD  gabapentin (NEURONTIN) 100 MG capsule TAKE 2 CAPSULES BY MOUTH TWICE A DAY 11/27/21   Philemon Kingdom, MD  insulin aspart (NOVOLOG FLEXPEN) 100 UNIT/ML FlexPen Inject up to 15 units daily under skin as advised Patient taking differently: Inject 4-7 Units into the skin 3 (three) times daily before meals. Per sliding scale 04/02/21   Philemon Kingdom, MD  insulin degludec (TRESIBA FLEXTOUCH) 100 UNIT/ML FlexTouch Pen Inject 12 Units into the skin daily. 03/13/22   Philemon Kingdom, MD  levETIRAcetam (KEPPRA) 250 MG tablet TAKE 1 TABLET BY MOUTH TWICE A DAY 01/15/22   Alric Ran, MD  magnesium oxide (MAG-OX) 400 (240 Mg) MG tablet TAKE 1 TABLET BY MOUTH TWICE A DAY 12/19/21   Biagio Borg, MD  Multiple Vitamins-Minerals (EYE VITAMINS PO) Take 1 tablet by mouth every morning.    [provider]  nitroGLYCERIN (NITROSTAT) 0.4 MG SL tablet Place 1 tablet (0.4 mg total) under the tongue every 5 (five) minutes as needed for chest pain. 05/20/21   [place], Maricela Bo, FNP  NOVOLOG 100 UNIT/ML injection INJECT 10-16 UNITS INTO THE SKIN 3 (THREE) TIMES DAILY WITH MEALS. 01/15/22   Philemon Kingdom, MD  omeprazole (PRILOSEC OTC) 20 MG tablet Take 20 mg by mouth every morning.    [provider]  rosuvastatin (CRESTOR) 40 MG tablet TAKE 1  TABLET BY MOUTH EVERY DAY 10/07/21   Larey Dresser, MD  sacubitril-valsartan (ENTRESTO) 97-103 MG Take 1 tablet by mouth 2 (two) times daily. 12/18/21   Larey Dresser, MD  Semaglutide,0.25 or 0.5MG /DOS, (OZEMPIC, 0.25 OR 0.5 MG/DOSE,) 2 MG/3ML SOPN Inject 0.5 mg into the skin once a week. 01/23/22   Philemon Kingdom, MD    Inpatient Medications: Scheduled Meds:  amiodarone  150 mg Intravenous Once   Continuous Infusions:  amiodarone     Followed by   amiodarone 30 mg/hr (06/11/22 1224)   PRN Meds:   Allergies:    Allergies  Allergen Reactions   Veltassa [Patiromer] Diarrhea   [place] Other (See Comments)    indigestion   Promethazine Hcl Other (See Comments)    hyperactivity   Spironolactone Diarrhea and Nausea And Vomiting   Plavix [Clopidogrel Bisulfate] Rash    Social History:   Social History   Socioeconomic History   Marital status: Divorced    Spouse name: Not on file   Number of children: 3   Years of education: 12   Highest education level: Not on file  Occupational History   Occupation: Retried  Tobacco Use   Smoking status: Former    Types: Cigarettes    Quit date: 08/16/1995    Years since quitting: 26.8   Smokeless tobacco: Never  Vaping Use   Vaping Use: Never used  Substance and Sexual Activity   Alcohol use: No   Drug use: No   Sexual activity: Never    Birth control/protection: Post-menopausal  Other Topics Concern   Not on file  Social History Narrative   Regular exercise-no Caffeine Use- sometimes   Lives with family   Social Determinants of Health   Financial Resource Strain: Not on file  Food Insecurity: Not on file  Transportation Needs: Not on file  Physical Activity: Not on file  Stress: Not on file  Social Connections: Not on file  Intimate Partner Violence: Not on file    Family History:   Family History  Problem Relation Age of Onset   Diabetes Mother    Heart disease Mother    Hyperlipidemia Mother    Hypertension  Mother    Heart disease Father    Heart attack Father  Hypertension Father    Breast cancer Sister    Lung cancer Sister    Irritable bowel syndrome Sister    Early death Brother 17   Colon cancer Maternal Aunt    Seizures Granddaughter    Esophageal cancer Neg Hx    Colon polyps Neg Hx      ROS:  Please see the history of present illness.   All other ROS reviewed and negative.     Physical Exam/Data:   Vitals:   06/11/22 1116 06/11/22 1120 06/11/22 1130 06/11/22 1200  BP: 99/67  (!) 84/61 (!) 85/70  Pulse: (!) 150  (!) 149 (!) 151  Resp: 18  (!) 21 13  Temp: 98.3 F (36.8 C)     TempSrc: Oral     SpO2: 94%  97% 97%  Weight:  83.5 kg    Height:  5\' 3"  (1.6 m)     No intake or output data in the 24 hours ending 06/11/22 1226    06/11/2022   11:20 AM 03/13/2022   11:12 AM 02/24/2022   10:03 AM  Last 3 Weights  Weight (lbs) 184 lb 182 lb 12.8 oz 181 lb  Weight (kg) 83.462 kg 82.918 kg 82.101 kg     Body mass index is 32.59 kg/m.  General:  Well nourished, well developed, in no acute distress HEENT: normal Neck: no JVD Vascular: No carotid bruits; Distal pulses 2+ bilaterally Cardiac:  RRR; tachycardic, no murmurs Lungs:  CTA b/l, no wheezing, rhonchi or rales  Abd: soft, nontender Ext: no edema Musculoskeletal:  No deformities Skin: warm and dry  Neuro:  no focal abnormalities noted Psych:  Normal affect   EKG:  The EKG was personally reviewed and demonstrates:    Narrow QRS tachycardia, LAD,  suspect AFlutter, can not r/o ongoing VT or dual tachycardia  Telemetry:  Telemetry was personally reviewed and demonstrates:   Remains tachycardic, 140's, unclear underlying   Relevant CV Studies:   08/31/21: TTE 1. Left ventricular ejection fraction, by estimation, is 40%. The left  ventricle has moderately decreased function. The left ventricle  demonstrates regional wall motion abnormalities (see scoring  diagram/findings for description). There is mild   concentric left ventricular hypertrophy. Left ventricular diastolic  parameters are consistent with Grade I diastolic dysfunction (impaired  relaxation). There is hypokinesis of the left ventricular, basal-mid  inferoseptal wall and inferior wall.   2. Right ventricular systolic function was not well visualized. The right  ventricular size is not well visualized.   3. Left atrial size was moderately dilated.   4. Right atrial size was mildly dilated.   5. The mitral valve is normal in structure. Trivial mitral valve  regurgitation. No evidence of mitral stenosis.   6. The aortic valve is grossly normal. There is mild calcification of the  aortic valve. Aortic valve regurgitation is mild. Aortic valve sclerosis  is present, with no evidence of aortic valve stenosis.   7. The inferior vena cava is normal in size with greater than 50%  respiratory variability, suggesting right atrial pressure of 3 mmHg.   Comparison(s): No significant change from prior study. Prior images  reviewed side by side.    01/14/21: TTE  1. Left ventricular ejection fraction, by estimation, is 40%. The left  ventricle has mild to moderately decreased function. The left ventricle  demonstrates regional wall motion abnormalities with basal to mid inferior  and inferoseptal severe  hypokinesis. Left ventricular diastolic parameters are consistent with  Grade I  diastolic dysfunction (impaired relaxation).   2. Right ventricular systolic function is normal. The right ventricular  size is normal. The estimated right ventricular systolic pressure is [id number]  mmHg.   3. Left atrial size was mildly dilated.   4. The mitral valve is normal in structure. Trivial mitral valve  regurgitation. No evidence of mitral stenosis.   5. The aortic valve is tricuspid. Aortic valve regurgitation is mild. No  aortic stenosis is present.   6. The inferior vena cava is normal in size with <50% respiratory  variability, suggesting  right atrial pressure of 8 mmHg.   06/27/2019: TTE IMPRESSIONS   1. Left ventricular ejection fraction, by estimation, is 40 to 45%. The  left ventricle has mildly decreased function. The left ventricle  demonstrates regional wall motion abnormalities, infeiror and inferoseptal  hypokinesis. There is mild left  ventricular hypertrophy. Left ventricular diastolic parameters are  consistent with Grade I diastolic dysfunction (impaired relaxation).   2. Right ventricular systolic function is normal. The right ventricular  size is normal. Tricuspid regurgitation signal is inadequate for assessing  PA pressure.   3. Left atrial size was mildly dilated.   4. The mitral valve is normal in structure. No evidence of mitral valve  regurgitation. No evidence of mitral stenosis.   5. The aortic valve is tricuspid. Aortic valve regurgitation is mild. No  aortic stenosis is present.   6. The inferior vena cava is normal in size with greater than 50%  respiratory variability, suggesting right atrial pressure of 3 mmHg.      08/16/2018: TTE IMPRESSIONS   1. The left ventricle has a visually estimated ejection fraction of 40%.  The cavity size was normal. There is moderately increased left ventricular  wall thickness. Left ventricular diastolic Doppler parameters are  consistent with impaired relaxation.  Inferior and inferoseptal severe hypokinesis.   2. The right ventricle has normal systolic function. The cavity was  normal. There is no increase in right ventricular wall thickness.   3. Left atrial size was mildly dilated.   4. There is mild to moderate mitral annular calcification present. No  evidence of mitral valve stenosis. No significant regurgitation.   5. The aortic valve is tricuspid. Moderate calcification of the aortic  valve. Aortic valve regurgitation is mild by color flow Doppler. No  stenosis of the aortic valve.   6. The aortic root is normal in size and structure.   7. Normal  IVC. No complete TR doppler jet so unable to estimate PA  systolic pressure.     Laboratory Data:  High Sensitivity Troponin:  No results for input(s): "TROPONINIHS" in the last 720 hours.   ChemistryNo results for input(s): "NA", "K", "CL", "CO2", "GLUCOSE", "BUN", "CREATININE", "CALCIUM", "MG", "GFRNONAA", "GFRAA", "ANIONGAP" in the last 168 hours.  No results for input(s): "PROT", "ALBUMIN", "AST", "ALT", "ALKPHOS", "BILITOT" in the last 168 hours. Lipids No results for input(s): "CHOL", "TRIG", "HDL", "LABVLDL", "LDLCALC", "CHOLHDL" in the last 168 hours.  HematologyNo results for input(s): "WBC", "RBC", "HGB", "HCT", "MCV", "MCH", "MCHC", "RDW", "PLT" in the last 168 hours. Thyroid No results for input(s): "TSH", "FREET4" in the last 168 hours.  BNPNo results for input(s): "BNP", "PROBNP" in the last 168 hours.  DDimer No results for input(s): "DDIMER" in the last 168 hours.   Radiology/Studies:  DG Chest Portable 1 View  Result Date: 06/11/2022 CLINICAL DATA:  Palpitations EXAM: PORTABLE CHEST 1 VIEW COMPARISON:  08/31/2021 FINDINGS: Cardiac silhouette appears prominent. No pneumonia or pulmonary  edema. No pneumothorax or pleural effusion. Aorta is calcified. There is a left-sided pacer. IMPRESSION: Enlarged cardiac silhouette. Electronically Signed   By: Sammie Bench M.D.   On: 06/11/2022 12:01     Assessment and Plan:   VT Hx of MMVT and PMVT Chronic amiodarone Amio gtt started  S/p DCCV > in to a bigeminal rhythm, settled into AV paced > AP/VS rhythm VT one zone (monitor only) rate reduced to 139bpm VT 2 zone rate reduced to 164bpm  AFib CHA2DS2Vasc is 9, on Eliquis Reports good medication compliance She was in SR this AM, in/out of AF by device transmission  CAD No CP No trops done  ICM Chronic CHF (systolic) She does not appear overtly volume OL , CXR without edema  6.  CKD > AKI Labs pending >>> Creat came back 2.33, baseline appears to wobble, probably  1's Anishka Bushard hold entresto, jardiance and lasix today Labs AM   7. DM SSI for now DM RN consulted   Dr. Curt Bears Saketh Daubert see once out of procedure   Risk Assessment/Risk Scores:   For questions or updates, please contact [place] Please consult [email or web address] for contact info under    Signed, Baldwin Jamaica, PA-C  06/11/2022 12:26 PM  I have seen and examined this patient with Tommye Standard.  Agree with above, note added to reflect my findings.  She has a past medical history as detailed above.  She has an ischemic cardiomyopathy and is status post [facility] Jude dual-chamber pacemaker.  She was in her usual state of health until the office called her this morning and was noted that she was in ventricular tachycardia.  She was going in and out of atrial fibrillation and atrial flutter at the same time.  Last week she had mild GI symptoms with loose stools but had resolved with no fever.  Yesterday, she had multiple episodes of ATP therapy but no ICD shocks.  She presented to the emergency room feeling tired fatigued, short of breath.  She had a cardioversion in emergency room and is now atrial paced.  She feels improved.  GEN: Well nourished, well developed, in no acute distress  HEENT: normal  Neck: no JVD, carotid bruits, or masses Cardiac: RRR; no murmurs, rubs, or gallops,no edema  Respiratory:  clear to auscultation bilaterally, normal work of breathing GI: soft, nontender, nondistended, + BS MS: no deformity or atrophy  Skin: warm and dry, device site well healed Neuro:  Strength and sensation are intact Psych: euthymic mood, full affect   Ventricular tachycardia: Has a history of both monomorphic and polymorphic VT.  Is on amiodarone 100 mg daily.  Riannon Mukherjee start IV amiodarone.  She Yohan Samons likely need a higher maintenance dose.  She did not have any chest pain during this episode. Atrial fibrillation: CHA2DS2-VASc of 9.  Currently on Eliquis.  In and out of atrial fibrillation  based on device interrogation.  Continue amiodarone. Coronary artery disease: No current chest pain.  No ischemic evaluation at this time. Chronic systolic heart failure: Due to ischemic cardiomyopathy.  Does not appear overtly volume overloaded.  No edema on chest x-ray.  Eimy Plaza continue home medications. 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  Likely due to prolonged episode of ventricular tachycardia.  Walton Digilio reassess in the morning with maintenance of sinus rhythm. Hyperkalemia: Likely due to ventricular tachycardia.  Status post Lokelma. Diabetes: Sliding scale insulin  Wednesday Ericsson M. Kaylob Wallen MD 06/11/2022 4:46 PM

## 2022-06-11 NOTE — Progress Notes (Signed)
RRT at bedside during cardioversion. Jawthrust used for 4 minutes to assist with inhalation. End-tidal CO2 30.

## 2022-06-11 NOTE — ED Triage Notes (Signed)
PT. STATED, THE Dr's office called and said my HR is beating high and I have been feeling some weakness and tired lately.

## 2022-06-11 NOTE — ED Notes (Signed)
Delegated to another RN to notify EDP of pt BP.

## 2022-06-11 NOTE — Telephone Encounter (Signed)
Alert remote reviewed. Normal device function.   Presenting is AF at 160 bpm, ? VT.  On [place], AF burden is 6.5% There were 22 VT arrhythmias detected that were given ATP.  Sent to triage Next remote 07/15/2022.  Kathy Breach, RN, CCDS, [employer]  Updated transmission shows patient is still in a sustained VT and is going in and out of AF over past 24 hours.   Has had 22 ATP treatments in past 24 hours.  After ATP patient appears to continue in a slow sustained VT that falls under VT zone.  Patient is sx with SOB on exertion, fatigue and weakness. She is having someone drive her to [facility] ER now.  Dr. Myles Gip aware and agrees with plan.  Will hold her appt tomorrow with Dr. Curt Bears at 230pm for now.  Will alert Dr. Curt Bears who is in the hospital today.   PRESENTING AT 9AM TODAY:          PRESENTING AT 300AM THIS MORNING

## 2022-06-11 NOTE — H&P (Signed)
See consult note to serve as H&P  Tommye Standard, PA-C

## 2022-06-11 NOTE — ED Notes (Signed)
ED TO INPATIENT HANDOFF REPORT  ED Nurse Name and Phone #: Richardson Landry [phone]  S Name/Age/Gender [name] 77 y.o. female Room/Bed: 023C/023C  Code Status   Code Status: Full Code  Home/SNF/Other Home Patient oriented to: self, place, time, and situation Is this baseline? Yes   Triage Complete: Triage complete  Chief Complaint VT (ventricular tachycardia) ([place]) [I47.20]  Triage Note PT. STATED, THE Dr's office called and said my HR is beating high and I have been feeling some weakness and tired lately.   Allergies Allergies  Allergen Reactions   Veltassa [Patiromer] Diarrhea   Darvon Other (See Comments)    indigestion   Promethazine Hcl Other (See Comments)    hyperactivity   Spironolactone Diarrhea and Nausea And Vomiting   Plavix [Clopidogrel Bisulfate] Rash    Level of Care/Admitting Diagnosis ED Disposition     ED Disposition  Admit   Condition  --   [place]: [place] [[id number]]  Level of Care: ICU [6]  May admit patient to [facility] or [facility] if equivalent level of care is available:: No  Covid Evaluation: Asymptomatic - no recent exposure (last 10 days) testing not required  Diagnosis: VT (ventricular tachycardia) [facility]) [id number]  Admitting Physician: Constance Haw [phone]  Attending Physician: Constance Haw [phone]  Bed request comments: 2 heart  Certification:: I certify this patient will need inpatient services for at least 2 midnights  Estimated Length of Stay: 3          B Medical/Surgery History Past Medical History:  Diagnosis Date   Allergy    Arthritis    Atrial fibrillation ([place])    controlled with amiodarone, on coumadin   Chronic renal insufficiency    Chronic systolic heart failure (HCC)    Congestive heart failure ([place])    Coronary artery disease 01/31/2011   Diabetes mellitus    type 1   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    History of chicken pox     Hyperlipidemia    Hypertension    Ischemic cardiomyopathy    Lumbar spondylosis 01/11/2012   Sleep apnea    Stroke [facility]) 2010   eye doctor said she had TIA   Ventricular tachycardia ([place])    Polymorphic   Past Surgical History:  Procedure Laterality Date   [place]   COLONOSCOPY WITH PROPOFOL N/A 02/24/2018   Procedure: COLONOSCOPY WITH PROPOFOL;  Surgeon: Milus Banister, MD;  Location: [facility] ENDOSCOPY;  Service: Endoscopy;  Laterality: N/A;   ICD  2003/2007   implanted by Dr Rollene Fare, most recent generator change 2/13 by Dr Rayann Heman, Analyze ST study patient   ICD GENERATOR CHANGEOUT N/A 04/11/2020   Procedure: [place];  Surgeon: Thompson Grayer, MD;  Location: [facility] CV LAB;  Service: Cardiovascular;  Laterality: N/A;   IMPLANTABLE CARDIOVERTER DEFIBRILLATOR (ICD) GENERATOR CHANGE N/A 05/05/2011   Procedure: ICD GENERATOR CHANGE;  Surgeon: Thompson Grayer, MD;  Location: [facility] CATH LAB;  Service: Cardiovascular;  Laterality: N/A;   LEFT HEART CATHETERIZATION WITH CORONARY ANGIOGRAM N/A 01/30/2011   Procedure: LEFT HEART CATHETERIZATION WITH CORONARY ANGIOGRAM;  Surgeon: Larey Dresser, MD;  Location: [facility] CATH LAB;  Service: Cardiovascular;  Laterality: N/A;   POLYPECTOMY  02/24/2018   Procedure: POLYPECTOMY;  Surgeon: Milus Banister, MD;  Location: [facility] ENDOSCOPY;  Service: Endoscopy;;   [place]  A IV Location/Drains/Wounds Patient Lines/Drains/Airways Status     Active Line/Drains/Airways     Name Placement date Placement time Site Days   Peripheral IV 06/11/22 20 G 1.88" Anterior;Proximal;Right Forearm 06/11/22  1219  Forearm  less than 1            Intake/Output Last 24 hours No intake or output data in the 24 hours ending 06/11/22 1529  Labs/Imaging Results for orders placed or performed during the hospital encounter of 06/11/22 (from the past 48 hour(s))   Comprehensive metabolic panel     Status: Abnormal   Collection Time: 06/11/22 12:15 PM  Result Value Ref Range   Sodium 137 135 - 145 mmol/L   Potassium 5.8 (H) 3.5 - 5.1 mmol/L   Chloride 106 98 - 111 mmol/L   CO2 20 (L) 22 - 32 mmol/L   Glucose, Bld 273 (H) 70 - 99 mg/dL    Comment: Glucose reference range applies only to samples taken after fasting for at least 8 hours.   BUN 47 (H) 8 - 23 mg/dL   Creatinine, Ser 2.33 (H) 0.44 - 1.00 mg/dL   Calcium 8.4 (L) 8.9 - 10.3 mg/dL   Total Protein 6.2 (L) 6.5 - 8.1 g/dL   Albumin 3.2 (L) 3.5 - 5.0 g/dL   AST 16 15 - 41 U/L   ALT 11 0 - 44 U/L   Alkaline Phosphatase 69 38 - 126 U/L   Total Bilirubin 0.6 0.3 - 1.2 mg/dL   GFR, Estimated 21 (L) >60 mL/min    Comment: (NOTE) Calculated using the CKD-EPI Creatinine Equation (2021)    Anion gap 11 5 - 15    Comment: Performed at [facility], [place] [address], [place], [place] [address]  Lipase, blood     Status: None   Collection Time: 06/11/22 12:15 PM  Result Value Ref Range   Lipase 35 11 - 51 U/L    Comment: Performed at [facility] [address] [place], [place] [address]  CBC with Differential     Status: Abnormal   Collection Time: 06/11/22 12:15 PM  Result Value Ref Range   WBC 6.5 4.0 - 10.5 K/uL   RBC 3.91 3.87 - 5.11 MIL/uL   Hemoglobin 11.6 (L) 12.0 - 15.0 g/dL   HCT 37.6 36.0 - 46.0 %   MCV 96.2 80.0 - 100.0 fL   MCH 29.7 26.0 - 34.0 pg   MCHC 30.9 30.0 - 36.0 g/dL   RDW 16.4 (H) 11.5 - 15.5 %   Platelets 190 150 - 400 K/uL   nRBC 0.0 0.0 - 0.2 %   Neutrophils Relative % 70 %   Neutro Abs 4.5 1.7 - 7.7 K/uL   Lymphocytes Relative 21 %   Lymphs Abs 1.4 0.7 - 4.0 K/uL   Monocytes Relative 7 %   Monocytes Absolute 0.5 0.1 - 1.0 K/uL   Eosinophils Relative 2 %   Eosinophils Absolute 0.1 0.0 - 0.5 K/uL   Basophils Relative 0 %   Basophils Absolute 0.0 0.0 - 0.1 K/uL   Immature Granulocytes 0 %   Abs Immature Granulocytes 0.01 0.00 - 0.07 K/uL     Comment: Performed at [facility], [address], [place], [place] [address]  Magnesium     Status: Abnormal   Collection Time: 06/11/22 12:15 PM  Result Value Ref Range   Magnesium 2.6 (H) 1.7 - 2.4 mg/dL    Comment: Performed at [facility],  [address], [place], [place] [address]  TSH     Status: None   Collection Time: 06/11/22 12:15 PM  Result Value Ref Range   TSH 3.640 0.350 - 4.500 uIU/mL    Comment: Performed by a 3rd Generation assay with a functional sensitivity of <=0.01 uIU/mL. Performed at [facility], [place] [address], [place], [place] [address]    *Note: Due to a large number of results and/or encounters for the requested time period, some results have not been displayed. A complete set of results can be found in Results Review.   DG Chest Portable 1 View  Result Date: 06/11/2022 CLINICAL DATA:  Palpitations EXAM: PORTABLE CHEST 1 VIEW COMPARISON:  08/31/2021 FINDINGS: Cardiac silhouette appears prominent. No pneumonia or pulmonary edema. No pneumothorax or pleural effusion. Aorta is calcified. There is a left-sided pacer. IMPRESSION: Enlarged cardiac silhouette. Electronically Signed   By: Sammie Bench M.D.   On: 06/11/2022 12:01    Pending Labs Unresulted Labs (From admission, onward)     Start     Ordered   06/13/22 [id number]  Basic metabolic panel  Daily,   R      06/11/22 1511   06/12/22 0500  Comprehensive metabolic panel  Tomorrow morning,   R        06/11/22 1511            Vitals/Pain Today's Vitals   06/11/22 1345 06/11/22 1400 06/11/22 1430 06/11/22 1523  BP: (!) 83/62 101/71 99/65   Pulse: 60 60 60   Resp: 17 (!) 24 (!) 8   Temp:    (!) 97 F (36.1 C)  TempSrc:    Oral  SpO2: 100% 96% 99%   Weight:      Height:      PainSc:        Isolation Precautions No active isolations  Medications Medications  amiodarone (NEXTERONE) 1.8 mg/mL load via infusion 150 mg (150 mg Intravenous Bolus from Bag 06/11/22 1224)     Followed by  amiodarone (NEXTERONE PREMIX) 360-4.14 MG/200ML-% (1.8 mg/mL) IV infusion (60 mg/hr Intravenous New Bag/Given 06/11/22 1239)    Followed by  amiodarone (NEXTERONE PREMIX) 360-4.14 MG/200ML-% (1.8 mg/mL) IV infusion (0 mg/hr Intravenous Stopped 06/11/22 1237)  aspirin EC tablet 81 mg (has no administration in time range)  nitroGLYCERIN (NITROSTAT) SL tablet 0.4 mg (has no administration in time range)  acetaminophen (TYLENOL) tablet 650 mg (has no administration in time range)  insulin aspart (novoLOG) injection 0-15 Units (has no administration in time range)  sodium chloride 0.9 % bolus 500 mL (0 mLs Intravenous Stopped 06/11/22 1446)  etomidate (AMIDATE) injection 6 mg (6 mg Intravenous Given 06/11/22 1329)  sodium zirconium cyclosilicate (LOKELMA) packet 10 g (10 g Oral Given 06/11/22 1523)    Mobility walks     Focused Assessments Cardiac Assessment Handoff:  Cardiac Rhythm: Ventricular paced Lab Results  Component Value Date   CKTOTAL 98 01/28/2011   CKMB 2.8 01/28/2011   TROPONINI <0.30 01/28/2011   No results found for: "DDIMER" Does the Patient currently have chest pain? No    R Recommendations: See Admitting Provider Note  Report given to:   Additional Notes:

## 2022-06-11 NOTE — ED Provider Notes (Signed)
Emergency Department Provider Note   I have reviewed the triage vital signs and the nursing notes.   HISTORY  Chief Complaint Palpitations, Weakness, and Fatigue   HPI [name] is a 77 y.o. female past medical history of congestive heart failure, diabetes, hyperlipidemia, PAF on amiodarone and compliant with anticoagulation.  She tells me that she was called by the cardiology office after remote interrogation of her pacemaker.  She was apparently found to have several episodes of nonsustained ventricular tachycardia.  She did not feel any defibrillating shocks.  She has been feeling fatigued but no other symptoms and states she would not have otherwise come to the ER unless called.   Past Medical History:  Diagnosis Date   Allergy    Arthritis    Atrial fibrillation ([place])    controlled with amiodarone, on coumadin   Chronic renal insufficiency    Chronic systolic heart failure (HCC)    Congestive heart failure ([place])    Coronary artery disease 01/31/2011   Diabetes mellitus    type 1   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    History of chicken pox    Hyperlipidemia    Hypertension    Ischemic cardiomyopathy    Lumbar spondylosis 01/11/2012   Sleep apnea    Stroke [facility]) 2010   eye doctor said she had TIA   Ventricular tachycardia ([place])    Polymorphic    Review of Systems  Constitutional: No fever/chills. Positive fatigue.  Eyes: No visual changes. ENT: No sore throat. Cardiovascular: Denies chest pain. Respiratory: Denies shortness of breath. Gastrointestinal: No abdominal pain.  No nausea, no vomiting.  No diarrhea.  No constipation. Genitourinary: Negative for dysuria. Musculoskeletal: Negative for back pain. Skin: Negative for rash. Neurological: Negative for headaches, focal weakness or numbness.  ____________________________________________   PHYSICAL EXAM:  VITAL SIGNS: ED Triage Vitals  Enc Vitals Group     BP 06/11/22 1116  99/67     Pulse Rate 06/11/22 1116 (!) 150     Resp 06/11/22 1116 18     Temp 06/11/22 1116 98.3 F (36.8 C)     Temp Source 06/11/22 1116 Oral     SpO2 06/11/22 1116 94 %     Weight 06/11/22 1120 184 lb (83.5 kg)     Height 06/11/22 1120 5\' 3"  (1.6 m)    Constitutional: Alert and oriented. Well appearing and in no acute distress. Eyes: Conjunctivae are normal.  Head: Atraumatic. Nose: No congestion/rhinnorhea. Mouth/Throat: Mucous membranes are moist.  Neck: No stridor.  Cardiovascular: Normal rate, regular rhythm. Good peripheral circulation. Grossly normal heart sounds.   Respiratory: Normal respiratory effort.  No retractions. Lungs CTAB. Gastrointestinal: Soft and nontender. No distention.  Musculoskeletal: No lower extremity tenderness nor edema. No gross deformities of extremities. Neurologic:  Normal speech and language. No gross focal neurologic deficits are appreciated.  Skin:  Skin is warm, dry and intact. No rash noted.  ____________________________________________   LABS (all labs ordered are listed, but only abnormal results are displayed)  Labs Reviewed  COMPREHENSIVE METABOLIC PANEL - Abnormal; Notable for the following components:      Result Value   Potassium 5.8 (*)    CO2 20 (*)    Glucose, Bld 273 (*)    BUN 47 (*)    Creatinine, Ser 2.33 (*)    Calcium 8.4 (*)    Total Protein 6.2 (*)    Albumin 3.2 (*)    GFR, Estimated 21 (*)  All other components within normal limits  CBC WITH DIFFERENTIAL/PLATELET - Abnormal; Notable for the following components:   Hemoglobin 11.6 (*)    RDW 16.4 (*)    All other components within normal limits  MAGNESIUM - Abnormal; Notable for the following components:   Magnesium 2.6 (*)    All other components within normal limits  LIPASE, BLOOD  TSH   ____________________________________________  EKG   EKG Interpretation  Date/Time:  Thursday June 11 2022 11:18:25 EDT Ventricular Rate:  151 PR  Interval:  180 QRS Duration: 100 QT Interval:  324 QTC Calculation: 513 R Axis:   -76 Text Interpretation: Atrial flutter Left axis deviation Incomplete right bundle branch block Anterior infarct , age undetermined Marked ST abnormality, possible inferolateral subendocardial injury Abnormal ECG When compared with ECG of 18-Dec-2021 10:32, PREVIOUS ECG IS PRESENT Confirmed by Nanda Quinton [phone]) on 06/11/2022 11:38:55 AM        ____________________________________________  RADIOLOGY  DG Chest Portable 1 View  Result Date: 06/11/2022 CLINICAL DATA:  Palpitations EXAM: PORTABLE CHEST 1 VIEW COMPARISON:  08/31/2021 FINDINGS: Cardiac silhouette appears prominent. No pneumonia or pulmonary edema. No pneumothorax or pleural effusion. Aorta is calcified. There is a left-sided pacer. IMPRESSION: Enlarged cardiac silhouette. Electronically Signed   By: Sammie Bench M.D.   On: 06/11/2022 12:01    ____________________________________________   PROCEDURES  Procedure(s) performed:   .Critical Care  Performed by: Margette Fast, MD Authorized by: Margette Fast, MD   Critical care provider statement:    Critical care time (minutes):  45   Critical care time was exclusive of:  Separately billable procedures and treating other patients and teaching time   Critical care was necessary to treat or prevent imminent or life-threatening deterioration of the following conditions:  Circulatory failure   Critical care was time spent personally by me on the following activities:  Development of treatment plan with patient or surrogate, discussions with consultants, evaluation of patient's response to treatment, examination of patient, ordering and review of laboratory studies, ordering and review of radiographic studies, ordering and performing treatments and interventions, pulse oximetry, re-evaluation of patient's condition, review of old charts and obtaining history from patient or surrogate   I  assumed direction of critical care for this patient from another provider in my specialty: no     Care discussed with: admitting provider   .Cardioversion  Date/Time: 06/11/2022 3:44 PM  Performed by: Margette Fast, MD Authorized by: Margette Fast, MD   Consent:    Consent obtained:  Written   Consent given by:  Patient   Risks discussed:  Cutaneous burn, death and induced arrhythmia   Alternatives discussed:  Rate-control medication Pre-procedure details:    Cardioversion basis:  Emergent   Rhythm:  Atrial flutter   Electrode placement:  Anterior-posterior Patient sedated: Yes. Refer to sedation procedure documentation for details of sedation.  Attempt one:    Cardioversion mode:  Synchronous   Waveform:  Biphasic   Shock (Joules):  100   Shock outcome:  Conversion to normal sinus rhythm Post-procedure details:    Patient status:  Awake   Patient tolerance of procedure:  Tolerated well, no immediate complications .Sedation  Date/Time: 06/11/2022 3:45 PM  Performed by: Margette Fast, MD Authorized by: Margette Fast, MD   Consent:    Consent obtained:  Written   Consent given by:  Patient   Risks discussed:  Allergic reaction, dysrhythmia, inadequate sedation, nausea, vomiting, respiratory compromise necessitating ventilatory assistance  and intubation, prolonged sedation necessitating reversal and prolonged hypoxia resulting in organ damage Universal protocol:    Immediately prior to procedure, a time out was called: yes     Patient identity confirmed:  Arm band and verbally with patient Indications:    Procedure performed:  Cardioversion Pre-sedation assessment:    Time since last food or drink:  6 hours   ASA classification: class 3 - patient with severe systemic disease     Mallampati score:  II - soft palate, uvula, fauces visible   Pre-sedation assessments completed and reviewed: airway patency, cardiovascular function, hydration status, mental status,  nausea/vomiting, pain level, respiratory function and temperature   Immediate pre-procedure details:    Reassessment: Patient reassessed immediately prior to procedure     Reviewed: vital signs, relevant labs/tests and NPO status     Verified: bag valve mask available, emergency equipment available, intubation equipment available, IV patency confirmed, oxygen available and suction available   Procedure details (see MAR for exact dosages):    Preoxygenation:  Nasal cannula   Sedation:  Etomidate   Intended level of sedation: deep   Intra-procedure monitoring:  Blood pressure monitoring, cardiac monitor, continuous pulse oximetry, continuous capnometry, frequent LOC assessments and frequent vital sign checks   Intra-procedure events: none     Total Provider sedation time (minutes):  23 Post-procedure details:    Attendance: Constant attendance by certified staff until patient recovered     Recovery: Patient returned to pre-procedure baseline     Post-sedation assessments completed and reviewed: airway patency, cardiovascular function, hydration status, mental status, nausea/vomiting, pain level, respiratory function and temperature     Patient is stable for discharge or admission: yes     Procedure completion:  Tolerated well, no immediate complications    ____________________________________________   INITIAL IMPRESSION / ASSESSMENT AND PLAN / ED COURSE  Pertinent labs & imaging results that were available during my care of the patient were reviewed by me and considered in my medical decision making (see chart for details).   This patient is Presenting for Evaluation of fatigue, which does require a range of treatment options, and is a complaint that involves a high risk of morbidity and mortality.  The Differential Diagnoses include SVT, PAF, atrial flutter, NSVT, etc.  Critical Interventions-    Medications  amiodarone (NEXTERONE) 1.8 mg/mL load via infusion 150 mg (150 mg  Intravenous Bolus from Bag 06/11/22 1224)    Followed by  amiodarone (NEXTERONE PREMIX) 360-4.14 MG/200ML-% (1.8 mg/mL) IV infusion (60 mg/hr Intravenous New Bag/Given 06/11/22 1239)    Followed by  amiodarone (NEXTERONE PREMIX) 360-4.14 MG/200ML-% (1.8 mg/mL) IV infusion (0 mg/hr Intravenous Stopped 06/11/22 1237)  aspirin EC tablet 81 mg (has no administration in time range)  nitroGLYCERIN (NITROSTAT) SL tablet 0.4 mg (has no administration in time range)  acetaminophen (TYLENOL) tablet 650 mg (has no administration in time range)  insulin aspart (novoLOG) injection 0-15 Units (has no administration in time range)  sodium chloride 0.9 % bolus 500 mL (0 mLs Intravenous Stopped 06/11/22 1446)  etomidate (AMIDATE) injection 6 mg (6 mg Intravenous Given 06/11/22 1329)  sodium zirconium cyclosilicate (LOKELMA) packet 10 g (10 g Oral Given 06/11/22 1523)    Reassessment after intervention: Patient feeling well after cardioversion.   I decided to review pertinent External Data, and in summary patient with remote device check this AM with NSVT noted. Advised to present to the ED.   Clinical Laboratory Tests Ordered, included museum slightly high at 2.6 with  potassium of 5.8.  Mild anemia at 11.6.  Creatinine elevated from baseline to 2.33 with baseline appears to be around 1.2.   Radiologic Tests Ordered, included CXR. I independently interpreted the images and agree with radiology interpretation.   Cardiac Monitor Tracing which shows Atrial flutter vs SVT. No NSVT on my evaluation in the ED.    Social Determinants of Health Risk patient is a non-smoker.   Consult complete with Cardiology. They advise cardioversion in the ED; continue amiodarone infusion, and will admit.   Medical Decision Making: Summary:  Presents emergency department with an SVT and tachycardia at home.  Overall patient is relatively asymptomatic with only mild fatigue noted.  She does have hypotension on arrival but remains  awake and alert.  She is not requiring pressors.  After discussion with cardiology as above, plan for cardioversion in the ED.  Amiodarone infusion is helping to control rate to some degree but feel patient would do much better with cardioversion.  After discussion and obtaining consent this procedure was performed as above without complication.  Reevaluation with update and discussion with patient.  She is feeling better after cardioversion.  Plan for admission with cardiology service.    Patient's presentation is most consistent with acute presentation with potential threat to life or bodily function.   Disposition: admit  ____________________________________________  FINAL CLINICAL IMPRESSION(S) / ED DIAGNOSES  Final diagnoses:  Tachycardia  NSVT (nonsustained ventricular tachycardia) (HCC)  AKI (acute kidney injury) ([place])    Note:  This document was prepared using Dragon voice recognition software and may include unintentional dictation errors.  Nanda Quinton, MD, [facility]    Mija Effertz, [facility], MD 06/11/22 [phone]

## 2022-06-12 ENCOUNTER — Encounter: Payer: Medicare HMO | Admitting: Cardiology

## 2022-06-12 DIAGNOSIS — I472 Ventricular tachycardia, unspecified: Secondary | ICD-10-CM | POA: Diagnosis not present

## 2022-06-12 LAB — COMPREHENSIVE METABOLIC PANEL
ALT: 12 U/L (ref 0–44)
AST: 21 U/L (ref 15–41)
Albumin: 2.8 g/dL — ABNORMAL LOW (ref 3.5–5.0)
Alkaline Phosphatase: 69 U/L (ref 38–126)
Anion gap: 8 (ref 5–15)
BUN: 41 mg/dL — ABNORMAL HIGH (ref 8–23)
CO2: 23 mmol/L (ref 22–32)
Calcium: 8 mg/dL — ABNORMAL LOW (ref 8.9–10.3)
Chloride: 106 mmol/L (ref 98–111)
Creatinine, Ser: 1.95 mg/dL — ABNORMAL HIGH (ref 0.44–1.00)
GFR, Estimated: 26 mL/min — ABNORMAL LOW (ref >60)
Glucose, Bld: 196 mg/dL — ABNORMAL HIGH (ref 70–99)
Potassium: 4.8 mmol/L (ref 3.5–5.1)
Sodium: 137 mmol/L (ref 135–145)
Total Bilirubin: 0.5 mg/dL (ref 0.3–1.2)
Total Protein: 5.6 g/dL — ABNORMAL LOW (ref 6.5–8.1)

## 2022-06-12 LAB — POTASSIUM: Potassium: 4.7 mmol/L (ref 3.5–5.1)

## 2022-06-12 LAB — GLUCOSE, CAPILLARY
Glucose-Capillary: 177 mg/dL — ABNORMAL HIGH (ref 70–99)
Glucose-Capillary: 226 mg/dL — ABNORMAL HIGH (ref 70–99)
Glucose-Capillary: 211 mg/dL — ABNORMAL HIGH (ref 70–99)
Glucose-Capillary: 210 mg/dL — ABNORMAL HIGH (ref 70–99)

## 2022-06-12 MED ORDER — CHLORHEXIDINE GLUCONATE CLOTH 2 % EX PADS
6.0000 | MEDICATED_PAD | Freq: Every day | CUTANEOUS | Status: DC
  Administered 2022-06-12: 6 via TOPICAL

## 2022-06-12 MED ORDER — INSULIN ASPART 100 UNIT/ML IJ SOLN
0.0000 [IU] | Freq: Every day | INTRAMUSCULAR | Status: DC
  Administered 2022-06-12: 2 [IU] via SUBCUTANEOUS

## 2022-06-12 MED ORDER — INSULIN ASPART 100 UNIT/ML IJ SOLN
0.0000 [IU] | Freq: Three times a day (TID) | INTRAMUSCULAR | Status: DC
  Administered 2022-06-12: 3 [IU] via SUBCUTANEOUS
  Administered 2022-06-13: 1 [IU] via SUBCUTANEOUS
  Administered 2022-06-13: 2 [IU] via SUBCUTANEOUS
  Administered 2022-06-13: 1 [IU] via SUBCUTANEOUS
  Administered 2022-06-14 (×2): 2 [IU] via SUBCUTANEOUS
  Administered 2022-06-14: 3 [IU] via SUBCUTANEOUS

## 2022-06-12 MED ORDER — FUROSEMIDE 40 MG PO TABS: 40.0000 mg | ORAL_TABLET | Freq: Once | ORAL | Status: DC

## 2022-06-12 MED ORDER — FUROSEMIDE 10 MG/ML IJ SOLN
40.0000 mg | Freq: Once | INTRAMUSCULAR | Status: AC
  Administered 2022-06-12: 40 mg via INTRAVENOUS
  Filled 2022-06-12: qty 4

## 2022-06-12 MED ORDER — INSULIN GLARGINE-YFGN 100 UNIT/ML ~~LOC~~ SOLN
6.0000 [IU] | Freq: Every day | SUBCUTANEOUS | Status: DC
  Administered 2022-06-12 – 2022-06-14 (×3): 6 [IU] via SUBCUTANEOUS
  Filled 2022-06-12 (×3): qty 0.06

## 2022-06-12 MED ORDER — AMIODARONE HCL 200 MG PO TABS
400.0000 mg | ORAL_TABLET | Freq: Two times a day (BID) | ORAL | Status: DC
  Administered 2022-06-12 – 2022-06-15 (×6): 400 mg via ORAL
  Filled 2022-06-12 (×6): qty 2

## 2022-06-12 NOTE — Progress Notes (Signed)
Telemetry reviewed In/out of AFib, occ PVCs, couplets 3 beat salvos, no sustained VTs, less V pacing  Transition to PO amiodarone 400mg  BID 10 days then daily for a week then 200mg  daily Early EP follow up arranged  D/w RN to discontinue amiodarone gtt once current bag is completed Plan to transfer to telemetry  Tommye Standard, PA-C

## 2022-06-12 NOTE — Progress Notes (Addendum)
Rounding Note    Patient Name: NIDIA FEATHER Date of Encounter: 06/12/2022  [place] Cardiologist: Loralie Champagne, MD   Subjective   Alarms kept her up last night though slept well inbetween, no CP, no SOB, eating breakfast  Inpatient Medications    Scheduled Meds:  apixaban  5 mg Oral BID   aspirin EC  81 mg Oral Daily   busPIRone  5 mg Oral BID   carvedilol  12.5 mg Oral BID WC   Chlorhexidine Gluconate Cloth  6 each Topical Daily   gabapentin  200 mg Oral BID   insulin aspart  0-15 Units Subcutaneous TID WC   levETIRAcetam  250 mg Oral BID   magnesium oxide  400 mg Oral BID   pantoprazole  40 mg Oral q morning   rosuvastatin  40 mg Oral Daily   Continuous Infusions:  amiodarone 30 mg/hr (06/12/22 0400)   PRN Meds: acetaminophen, nitroGLYCERIN   Vital Signs    Vitals:   06/12/22 0400 06/12/22 0500 06/12/22 0600 06/12/22 0700  BP: (!) 110/59 (!) 101/58 (!) 108/45   Pulse: 71 72 69 64  Resp: (!) 22 19 16  (!) 22  Temp:    97.6 F (36.4 C)  TempSrc:    Oral  SpO2: 99% 98% 100% 94%  Weight:      Height:        Intake/Output Summary (Last 24 hours) at 06/12/2022 0730 Last data filed at 06/12/2022 0400 Gross per 24 hour  Intake 417.39 ml  Output --  Net 417.39 ml      06/11/2022   11:20 AM 03/13/2022   11:12 AM 02/24/2022   10:03 AM  Last 3 Weights  Weight (lbs) 184 lb 182 lb 12.8 oz 181 lb  Weight (kg) 83.462 kg 82.918 kg 82.101 kg      Telemetry    Looks like she is in/out AFlutter, intermittent V pacing and some slower NSVT, no recurrent sustained VT/tachycardia - Personally Reviewed  ECG    AP/VS 61bpm, more pronounced T inversions from her baseline - Personally Reviewed  Physical Exam   GEN: No acute distress.   Neck: No JVD Cardiac: RRR, + extrasystoles, no murmurs, rubs, or gallops.  Respiratory: CTA b/l. GI: Soft, nontender, non-distended  MS: No edema; No deformity. Neuro:  Nonfocal  Psych: Normal affect   Labs     High Sensitivity Troponin:  No results for input(s): "TROPONINIHS" in the last 720 hours.   Chemistry Recent Labs  Lab 06/11/22 1215 06/12/22 0020  NA 137  --   K 5.8* 4.7  CL 106  --   CO2 20*  --   GLUCOSE 273*  --   BUN 47*  --   CREATININE 2.33*  --   CALCIUM 8.4*  --   MG 2.6*  --   PROT 6.2*  --   ALBUMIN 3.2*  --   AST 16  --   ALT 11  --   ALKPHOS 69  --   BILITOT 0.6  --   GFRNONAA 21*  --   ANIONGAP 11  --     Lipids No results for input(s): "CHOL", "TRIG", "HDL", "LABVLDL", "LDLCALC", "CHOLHDL" in the last 168 hours.  Hematology Recent Labs  Lab 06/11/22 1215  WBC 6.5  RBC 3.91  HGB 11.6*  HCT 37.6  MCV 96.2  MCH 29.7  MCHC 30.9  RDW 16.4*  PLT 190   Thyroid  Recent Labs  Lab 06/11/22 1215  TSH 3.640    BNPNo results for input(s): "BNP", "PROBNP" in the last 168 hours.  DDimer No results for input(s): "DDIMER" in the last 168 hours.   Radiology    DG Chest Portable 1 View  Result Date: 06/11/2022 CLINICAL DATA:  Palpitations EXAM: PORTABLE CHEST 1 VIEW COMPARISON:  08/31/2021 FINDINGS: Cardiac silhouette appears prominent. No pneumonia or pulmonary edema. No pneumothorax or pleural effusion. Aorta is calcified. There is a left-sided pacer. IMPRESSION: Enlarged cardiac silhouette. Electronically Signed   By: Sammie Bench M.D.   On: 06/11/2022 12:01    Cardiac Studies   08/31/21: TTE 1. Left ventricular ejection fraction, by estimation, is 40%. The left  ventricle has moderately decreased function. The left ventricle  demonstrates regional wall motion abnormalities (see scoring  diagram/findings for description). There is mild  concentric left ventricular hypertrophy. Left ventricular diastolic  parameters are consistent with Grade I diastolic dysfunction (impaired  relaxation). There is hypokinesis of the left ventricular, basal-mid  inferoseptal wall and inferior wall.   2. Right ventricular systolic function was not well visualized.  The right  ventricular size is not well visualized.   3. Left atrial size was moderately dilated.   4. Right atrial size was mildly dilated.   5. The mitral valve is normal in structure. Trivial mitral valve  regurgitation. No evidence of mitral stenosis.   6. The aortic valve is grossly normal. There is mild calcification of the  aortic valve. Aortic valve regurgitation is mild. Aortic valve sclerosis  is present, with no evidence of aortic valve stenosis.   7. The inferior vena cava is normal in size with greater than 50%  respiratory variability, suggesting right atrial pressure of 3 mmHg.   Comparison(s): No significant change from prior study. Prior images  reviewed side by side.      01/14/21: TTE  1. Left ventricular ejection fraction, by estimation, is 40%. The left  ventricle has mild to moderately decreased function. The left ventricle  demonstrates regional wall motion abnormalities with basal to mid inferior  and inferoseptal severe  hypokinesis. Left ventricular diastolic parameters are consistent with  Grade I diastolic dysfunction (impaired relaxation).   2. Right ventricular systolic function is normal. The right ventricular  size is normal. The estimated right ventricular systolic pressure is [id number]  mmHg.   3. Left atrial size was mildly dilated.   4. The mitral valve is normal in structure. Trivial mitral valve  regurgitation. No evidence of mitral stenosis.   5. The aortic valve is tricuspid. Aortic valve regurgitation is mild. No  aortic stenosis is present.   6. The inferior vena cava is normal in size with <50% respiratory  variability, suggesting right atrial pressure of 8 mmHg.    06/27/2019: TTE IMPRESSIONS   1. Left ventricular ejection fraction, by estimation, is 40 to 45%. The  left ventricle has mildly decreased function. The left ventricle  demonstrates regional wall motion abnormalities, infeiror and inferoseptal  hypokinesis. There is mild left   ventricular hypertrophy. Left ventricular diastolic parameters are  consistent with Grade I diastolic dysfunction (impaired relaxation).   2. Right ventricular systolic function is normal. The right ventricular  size is normal. Tricuspid regurgitation signal is inadequate for assessing  PA pressure.   3. Left atrial size was mildly dilated.   4. The mitral valve is normal in structure. No evidence of mitral valve  regurgitation. No evidence of mitral stenosis.   5. The aortic valve is tricuspid. Aortic valve regurgitation is  mild. No  aortic stenosis is present.   6. The inferior vena cava is normal in size with greater than 50%  respiratory variability, suggesting right atrial pressure of 3 mmHg.      08/16/2018: TTE IMPRESSIONS   1. The left ventricle has a visually estimated ejection fraction of 40%.  The cavity size was normal. There is moderately increased left ventricular  wall thickness. Left ventricular diastolic Doppler parameters are  consistent with impaired relaxation.  Inferior and inferoseptal severe hypokinesis.   2. The right ventricle has normal systolic function. The cavity was  normal. There is no increase in right ventricular wall thickness.   3. Left atrial size was mildly dilated.   4. There is mild to moderate mitral annular calcification present. No  evidence of mitral valve stenosis. No significant regurgitation.   5. The aortic valve is tricuspid. Moderate calcification of the aortic  valve. Aortic valve regurgitation is mild by color flow Doppler. No  stenosis of the aortic valve.   6. The aortic root is normal in size and structure.   7. Normal IVC. No complete TR doppler jet so unable to estimate PA  systolic pressure.     Patient Profile     77 y.o. female  with a hx of  CAD (s/p LAD and RCA PCI.  Last LHC in 11/12 with patent proximal LAD stent, ostial 70% D1 (jailed by stent), mild LAD stent patent, patent RCA stents), ICM, chronic CHF, VT, ICD,  AFib, stroke, DM, HTN, HLD, CKD (III), gastroparesis, Hypothyroidism   Device information SJM dual chamber ICD, implanted 2003,  2007, gen changes 2013, Jan 2022 2003 had syncope >> EP study with inducible PMVT + h/o PMVT 2012 with ICD shocks 04/24/21 appropriate tx for VT w/HV tx   At the time of her gen change Jan 2022,  SJM Riata ST 7040 lead was fluoroscopically and electrically normal today, opted to use this lead rather than replacing it after a long discussion with the patient prior to the procedure.     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 Amiodarone gtt to continue today, Levert Heslop re-look at tele later today, if rhythm stable plan to transition to PO for tonight > out of ICU   S/p DCCV in the ER  > in to a bigeminal rhythm, settled into AV paced > AP/VS rhythm VT one zone (monitor only) rate reduced to 139bpm VT 2 zone rate reduced to 164bpm No changes to ATP schemes  In d/w industry VIP turned on and base pacing rate reduced to 55 (from 60) to allow slower mode switch rate to try and reduce RV pacing burden      AFib/flutter CHA2DS2Vasc is 9, on Eliquis Reports good medication compliance In/out AF/flutter on telemetery Amiodarone as above   CAD No CP No plans for ischemic eval with known hx of the same   ICM Stable LVEF for years the 40's Chronic CHF (systolic) She does not appear overtly volume OL , CXR without edema No output charted though pt repots + urine output Await labs Held her Delene Loll, jardiance and lasix last night with AKI and hyperkalemia  Weight is up and OptiVol trending down >> one dose IV lasix today Follow Creat  6.  CKD > AKI Creat down some, K+ ok OprtiVol  trending down Faraaz Wolin give lasix today, hold her Entresto/jardiance again today     7. DM SSI for  now DM RN consulted  For questions or updates, please contact [place] Please consult [email or web address] for contact info under        Signed, Baldwin Jamaica, PA-C  06/12/2022, 7:30 AM    I have seen and examined this patient with Tommye Standard.  Agree with above, note added to reflect my findings.  Currently feeling well.  No acute complaints while lying in bed.  Creatinine improving.  Did have short episodes of wide-complex, though not tachycardic, as well as what appears to be atrial flutter.  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  Currently on IV amiodarone.  If telemetry looks good later in the day, Mardella Nuckles likely transition to p.o.  [place] also plan transition out of the ICU. Atrial fibrillation/flutter: Recurrence good medication compliance.  In and out of atrial fibrillation/flutter on telemetry.  Continue amiodarone. Coronary artery disease: No current ischemia. Chronic systolic heart failure: No obvious volume overload.  With improving creatinine, Rosmarie Esquibel potentially restart Lasix. Acute on chronic CKD stage IIIb: Creatinine improving.  Potassium also improving.    Anslee Micheletti M. Jerrett Baldinger MD 06/12/2022 9:13 AM

## 2022-06-12 NOTE — Inpatient Diabetes Management (Signed)
Inpatient Diabetes Program Recommendations  AACE/ADA: New Consensus Statement on Inpatient Glycemic Control (2015)  Target Ranges:  Prepandial:   less than 140 mg/dL      Peak postprandial:   less than 180 mg/dL (1-2 hours)      Critically ill patients:  140 - 180 mg/dL   Lab Results  Component Value Date   GLUCAP 177 (H) 06/12/2022   HGBA1C 6.2 (A) 03/13/2022    Latest Reference Range & Units 06/11/22 17:00 06/11/22 22:01 06/12/22 07:00  Glucose-Capillary 70 - 99 mg/dL 182 (H) 159 (H) 177 (H)  (H): Data is abnormally high  Diabetes history: DM2 Outpatient Diabetes medications: Levemir 12 units q hs, Trulicity 3 mg qweek, Jardiance 10 mg qd Current orders for Inpatient glycemic control: Novolog 0-15 units tid correction  Inpatient Diabetes Program Recommendations:   Received consult regarding diabetes management. Please consider: -Add Semglee 6 units q day (50% home basal dose) -Decrease Novolog correction to 0-9 units tid, 0-5 units hs   Thank you, Bethena Roys E. Kempton Milne, RN, MSN, CDE  Diabetes Coordinator Inpatient Glycemic Control Team Team Pager [phone] (8am-5pm) 06/12/2022 11:18 AM

## 2022-06-13 DIAGNOSIS — I472 Ventricular tachycardia, unspecified: Secondary | ICD-10-CM | POA: Diagnosis not present

## 2022-06-13 LAB — BASIC METABOLIC PANEL
Anion gap: 9 (ref 5–15)
BUN: 42 mg/dL — ABNORMAL HIGH (ref 8–23)
CO2: 22 mmol/L (ref 22–32)
Calcium: 7.9 mg/dL — ABNORMAL LOW (ref 8.9–10.3)
Chloride: 105 mmol/L (ref 98–111)
Creatinine, Ser: 1.99 mg/dL — ABNORMAL HIGH (ref 0.44–1.00)
GFR, Estimated: 26 mL/min — ABNORMAL LOW (ref >60)
Glucose, Bld: 128 mg/dL — ABNORMAL HIGH (ref 70–99)
Potassium: 4.2 mmol/L (ref 3.5–5.1)
Sodium: 136 mmol/L (ref 135–145)

## 2022-06-13 LAB — MAGNESIUM: Magnesium: 2.2 mg/dL (ref 1.7–2.4)

## 2022-06-13 LAB — GLUCOSE, CAPILLARY
Glucose-Capillary: 123 mg/dL — ABNORMAL HIGH (ref 70–99)
Glucose-Capillary: 229 mg/dL — ABNORMAL HIGH (ref 70–99)
Glucose-Capillary: 157 mg/dL — ABNORMAL HIGH (ref 70–99)
Glucose-Capillary: 138 mg/dL — ABNORMAL HIGH (ref 70–99)
Glucose-Capillary: 157 mg/dL — ABNORMAL HIGH (ref 70–99)

## 2022-06-13 MED ORDER — FUROSEMIDE 10 MG/ML IJ SOLN
40.0000 mg | Freq: Once | INTRAMUSCULAR | Status: AC
  Administered 2022-06-13: 40 mg via INTRAVENOUS
  Filled 2022-06-13: qty 4

## 2022-06-13 NOTE — Progress Notes (Signed)
   Rounding Note    Patient Name: [name] Date of Encounter: 06/13/2022  [place] Cardiologist: Loralie Champagne, MD   Subjective   NAEO. Out of ICU yesterday.   Vital Signs    Vitals:   06/13/22 0428 06/13/22 0454 06/13/22 0455 06/13/22 0744  BP: 113/62   (!) 104/54  Pulse: 67   62  Resp: 18   18  Temp: 97.7 F (36.5 C)   97.9 F (36.6 C)  TempSrc: Oral   Oral  SpO2: 98% (!) 76%  95%  Weight:   86.5 kg   Height:        Intake/Output Summary (Last 24 hours) at 06/13/2022 0802 Last data filed at 06/13/2022 0746 Gross per 24 hour  Intake 304.55 ml  Output --  Net 304.55 ml      06/13/2022    4:55 AM 06/12/2022    5:00 AM 06/11/2022   11:20 AM  Last 3 Weights  Weight (lbs) 190 lb 11.2 oz 194 lb 0.1 oz 184 lb  Weight (kg) 86.5 kg 88 kg 83.462 kg      Telemetry    AF/AFL. Occasional V paced beats.  - Personally Reviewed    Physical Exam   GEN: No acute distress.   Cardiac: RRR, no murmurs, rubs, or gallops.  Psych: Normal affect     Assessment & Plan    #VT storm On PO amiodarone. Will monitor for an additional 24-48 hours on oral before discharge.  #AF/AFL On eliquis Cont amiodarone  #CAD No ischemic symptoms  #NICM Volume up on exam. Pt reports improvement in volume status but still with some fluid on board. Repeat IV lasix today.  #CKD3b, acute on chronic Monitor kidney function with diuresis.  For questions or updates, please contact [place] Please consult [email or web address] for contact info under        Signed, Vickie Epley, MD  06/13/2022, 8:02 AM

## 2022-06-13 NOTE — Progress Notes (Signed)
Pt O2 dropping to mid 70's while asleep. 2L [place] O2 applied.

## 2022-06-14 ENCOUNTER — Encounter (HOSPITAL_COMMUNITY): Payer: Self-pay | Admitting: Cardiology

## 2022-06-14 DIAGNOSIS — I472 Ventricular tachycardia, unspecified: Secondary | ICD-10-CM | POA: Diagnosis not present

## 2022-06-14 LAB — BASIC METABOLIC PANEL
Anion gap: 10 (ref 5–15)
BUN: 40 mg/dL — ABNORMAL HIGH (ref 8–23)
CO2: 24 mmol/L (ref 22–32)
Calcium: 8 mg/dL — ABNORMAL LOW (ref 8.9–10.3)
Chloride: 102 mmol/L (ref 98–111)
Creatinine, Ser: 1.87 mg/dL — ABNORMAL HIGH (ref 0.44–1.00)
GFR, Estimated: 28 mL/min — ABNORMAL LOW (ref >60)
Glucose, Bld: 126 mg/dL — ABNORMAL HIGH (ref 70–99)
Potassium: 4 mmol/L (ref 3.5–5.1)
Sodium: 136 mmol/L (ref 135–145)

## 2022-06-14 LAB — GLUCOSE, CAPILLARY
Glucose-Capillary: 226 mg/dL — ABNORMAL HIGH (ref 70–99)
Glucose-Capillary: 165 mg/dL — ABNORMAL HIGH (ref 70–99)
Glucose-Capillary: 153 mg/dL — ABNORMAL HIGH (ref 70–99)
Glucose-Capillary: 188 mg/dL — ABNORMAL HIGH (ref 70–99)

## 2022-06-14 MED ORDER — TRAZODONE HCL 50 MG PO TABS
50.0000 mg | ORAL_TABLET | Freq: Once | ORAL | Status: AC
  Administered 2022-06-14: 50 mg via ORAL
  Filled 2022-06-14: qty 1

## 2022-06-14 MED ORDER — FUROSEMIDE 40 MG PO TABS
40.0000 mg | ORAL_TABLET | Freq: Every day | ORAL | Status: DC
  Administered 2022-06-14 – 2022-06-15 (×2): 40 mg via ORAL
  Filled 2022-06-14 (×2): qty 1

## 2022-06-14 MED ORDER — DICYCLOMINE HCL 10 MG PO CAPS
10.0000 mg | ORAL_CAPSULE | Freq: Four times a day (QID) | ORAL | Status: DC
  Administered 2022-06-14 – 2022-06-15 (×5): 10 mg via ORAL
  Filled 2022-06-14 (×5): qty 1

## 2022-06-14 MED ORDER — INSULIN GLARGINE-YFGN 100 UNIT/ML ~~LOC~~ SOLN
8.0000 [IU] | Freq: Every day | SUBCUTANEOUS | Status: DC
  Administered 2022-06-15: 8 [IU] via SUBCUTANEOUS
  Filled 2022-06-14: qty 0.08

## 2022-06-14 NOTE — Progress Notes (Signed)
   Rounding Note    Patient Name: [name] Date of Encounter: 06/14/2022  [place] Cardiologist: Loralie Champagne, MD   Subjective   NAEO. Sitting on side of bed this AM.   Vital Signs    Vitals:   06/13/22 1144 06/13/22 1608 06/13/22 2038 06/14/22 0630  BP: 95/61 114/80 118/83 127/63  Pulse: 77 83 62 62  Resp: 17 17 18 18   Temp: 98 F (36.7 C) 97.8 F (36.6 C) 97.6 F (36.4 C) 97.9 F (36.6 C)  TempSrc: Oral Oral Oral Oral  SpO2: 96% 97% 100% 92%  Weight:    85.4 kg  Height:        Intake/Output Summary (Last 24 hours) at 06/14/2022 0926 Last data filed at 06/14/2022 0640 Gross per 24 hour  Intake 360 ml  Output 2300 ml  Net -1940 ml       06/14/2022    6:30 AM 06/13/2022    4:55 AM 06/12/2022    5:00 AM  Last 3 Weights  Weight (lbs) 188 lb 3.2 oz 190 lb 11.2 oz 194 lb 0.1 oz  Weight (kg) 85.367 kg 86.5 kg 88 kg      Telemetry    AF/AFL. Occasional V paced beats.  - Personally Reviewed    Physical Exam   GEN: No acute distress.   Cardiac: RRR, no murmurs, rubs, or gallops.  Psych: Normal affect     Assessment & Plan    #VT storm On PO amiodarone. Will monitor for an additional 24 hours on oral before discharge.   #AF/AFL On eliquis Cont amiodarone  #CAD No ischemic symptoms  #NICM More euvolemic today. Transition to lasix 40mg  PO daily today.  #CKD3b, acute on chronic Monitor kidney function with diuresis.  Anticipate discharge Monday if rhythm and volume status remain stable.  For questions or updates, please contact [place] Please consult [email or web address] for contact info under        Signed, Vickie Epley, MD  06/14/2022, 9:26 AM

## 2022-06-15 ENCOUNTER — Other Ambulatory Visit (HOSPITAL_COMMUNITY): Payer: Self-pay

## 2022-06-15 ENCOUNTER — Inpatient Hospital Stay (HOSPITAL_COMMUNITY): Payer: Medicare HMO

## 2022-06-15 ENCOUNTER — Other Ambulatory Visit: Payer: Self-pay | Admitting: Internal Medicine

## 2022-06-15 DIAGNOSIS — I4891 Unspecified atrial fibrillation: Secondary | ICD-10-CM

## 2022-06-15 DIAGNOSIS — I472 Ventricular tachycardia, unspecified: Secondary | ICD-10-CM

## 2022-06-15 LAB — ECHOCARDIOGRAM COMPLETE
AV Vena cont: 0.3 cm
Area-P 1/2: 3.17 cm2
Calc EF: 38.4 %
Height: 63 in
P 1/2 time: 438 msec
S' Lateral: 4.6 cm
Single Plane A2C EF: 40.6 %
Single Plane A4C EF: 35.8 %
Weight: 2987.2 oz

## 2022-06-15 LAB — BASIC METABOLIC PANEL
Anion gap: 13 (ref 5–15)
BUN: 39 mg/dL — ABNORMAL HIGH (ref 8–23)
CO2: 24 mmol/L (ref 22–32)
Calcium: 8 mg/dL — ABNORMAL LOW (ref 8.9–10.3)
Chloride: 101 mmol/L (ref 98–111)
Creatinine, Ser: 1.91 mg/dL — ABNORMAL HIGH (ref 0.44–1.00)
GFR, Estimated: 27 mL/min — ABNORMAL LOW (ref >60)
Glucose, Bld: 120 mg/dL — ABNORMAL HIGH (ref 70–99)
Potassium: 3.9 mmol/L (ref 3.5–5.1)
Sodium: 138 mmol/L (ref 135–145)

## 2022-06-15 LAB — GLUCOSE, CAPILLARY: Glucose-Capillary: 112 mg/dL — ABNORMAL HIGH (ref 70–99)

## 2022-06-15 MED ORDER — AMIODARONE HCL 200 MG PO TABS
ORAL_TABLET | ORAL | 0 refills | Status: DC
  Filled 2022-06-15: qty 70, 40d supply, fill #0

## 2022-06-15 MED ORDER — LOSARTAN POTASSIUM 25 MG PO TABS
25.0000 mg | ORAL_TABLET | Freq: Every day | ORAL | 11 refills | Status: DC
  Filled 2022-06-15: qty 30, 30d supply, fill #0

## 2022-06-15 NOTE — Progress Notes (Signed)
Echocardiogram 2D Echocardiogram has been performed.  [name] 06/15/2022, 9:09 AM

## 2022-06-15 NOTE — Progress Notes (Signed)
Patient given discharge instructions and verbalized understanding. TOC meds delivered to patient. Patient to be discharged to discharge lounge to await ride home.

## 2022-06-15 NOTE — Care Management Important Message (Signed)
Important Message  Patient Details  Name: [name] MRN: [id number] Date of Birth: 07-Jan-1946   Medicare Important Message Given:  Yes     Shelda Altes 06/15/2022, 9:12 AM

## 2022-06-15 NOTE — Discharge Summary (Addendum)
ELECTROPHYSIOLOGY DISCHARGE SUMMARY    Patient ID: [name],  MRN: [id number], DOB/AGE: Nov 22, 1945 77 y.o.  Admit date: 06/11/2022 Discharge date: 06/15/2022  Primary Care Physician: Pcp, No  Primary Cardiologist: Loralie Champagne, MD  Electrophysiologist: Dr. Curt Bears (establishing from Dr Rayann Heman)  Primary Discharge Diagnosis:  VT storm  Secondary Discharge Diagnosis:  Paroxysmal atrial fib Paroxysmal atrial flutter NICM AKI on CKD III  Brief HPI: [name] is a 77 y.o. female with a history of CAD (s/p LAD and RCA PCI.  Last LHC in 11/12 with patent proximal LAD stent, ostial 70% D1 (jailed by stent), mild LAD stent patent, patent RCA stents), ICM, chronic CHF, VT, ICD, AFib, stroke, DM, HTN, HLD, CKD (III), gastroparesis, and Hypothyroidism  admitted for VT storm.   Hospital Course:  The patient was admitted after device clinic alerted to multiple VT episodes, with presenting rhythm in ongoing VT below detection. Noted to have AKI and Hyperkalemia on admission as well. ARNi held and given lokelma. Pt was cardioverted in the ED -> to bigeminal rhythm, settled in to AV paced > AP/VS rhythm. Pt was re-loaded on IV amiodarone with improvement of rhythm. VT zones were changed as below.   They were monitored on telemetry overnight which demonstrated continued NSR. BP came up but Cr remained in 1.9 range so Entresto held.  The patient was examined and considered to be stable for discharge.  [place] driving restrictions were reviewed with the patient.  The patient [name] be seen back by EP APP and HF clinic in approx 1 week for post hospital care.   Discussed with HF team APP. [name] defer Entresto, and with BP improved (now high) [name] resume jardiance and low dose losartan as pt has very close HF and EP APP follow up.   VT 1 zone (monitor only) rate reduced to 139bpm VT 2 zone rate reduced to 164bpm  If VT recurs consider updating ischemic work up.  Update Echo as outpatient.    Physical Exam: Vitals:   06/14/22 2026 06/15/22 0612 06/15/22 0750 06/15/22 0751  BP: 127/76 (!) 131/58 (!) 156/58   Pulse: 61 (!) 56 61   Resp: 19 18 19 18   Temp: 98.7 F (37.1 C) (!) 97.5 F (36.4 C) (!) 97.5 F (36.4 C)   TempSrc: Oral Oral Oral   SpO2: 95% 98% 96%   Weight:  84.7 kg    Height:        GEN- NAD. A&O x 3.  HEENT: Normocephalic, atraumatic Lungs- CTAB, Normal effort.  Heart- RRR, No M/G/R.  GI- Soft, NT, ND.  Extremities- No clubbing, cyanosis, or edema; Groin without hematoma   Discharge Medications:  Allergies as of 06/15/2022       Reactions   Veltassa [patiromer] Diarrhea   Darvon Other (See Comments)   indigestion   Promethazine Hcl Other (See Comments)   hyperactivity   Spironolactone Diarrhea, Nausea And Vomiting   Plavix [clopidogrel Bisulfate] Rash        Medication List     STOP taking these medications    Entresto 97-103 MG Generic drug: sacubitril-valsartan       TAKE these medications    Accu-Chek Nano SmartView w/Device Kit Use to test blood sugar 4 times daily as instructed. Dx code: E11.59   acetaminophen 325 MG tablet Commonly known as: TYLENOL Take 2 tablets (650 mg total) by mouth every 4 (four) hours as needed for mild pain or moderate pain.   amiodarone 200 MG tablet  Commonly known as: PACERONE Take 2 tablets (400 mg total) by mouth 2 (two) times daily for 10 days, THEN 1 tablet (200 mg total) daily. Start taking on: June 15, 2022 What changed:  medication strength See the new instructions.   busPIRone 5 MG tablet Commonly known as: BUSPAR Take 1 tablet (5 mg total) by mouth 2 (two) times daily.   carvedilol 12.5 MG tablet Commonly known as: COREG TAKE 1 TABLET (12.5 MG TOTAL) BY MOUTH 2 (TWO) TIMES DAILY WITH A MEAL.   dicyclomine 10 MG capsule Commonly known as: BENTYL TAKE 1 CAPSULE (10 MG TOTAL) BY MOUTH 4 (FOUR) TIMES DAILY BEFORE MEALS AND AT BEDTIME.   Eliquis 5 MG Tabs tablet Generic  drug: apixaban TAKE 1 TABLET BY MOUTH TWICE A DAY What changed: how much to take   empagliflozin 10 MG Tabs tablet Commonly known as: JARDIANCE Take 1 tablet (10 mg total) by mouth every morning.   EYE VITAMINS PO Take 1 tablet by mouth 2 (two) times daily.   fluticasone 50 MCG/ACT nasal spray Commonly known as: FLONASE Place 1 spray into both nostrils daily as needed for allergies or rhinitis.   furosemide 20 MG tablet Commonly known as: LASIX TAKE 1 TABLET BY MOUTH EVERY DAY   gabapentin 100 MG capsule Commonly known as: NEURONTIN TAKE 2 CAPSULES BY MOUTH TWICE A DAY   Levemir 100 UNIT/ML injection Generic drug: insulin detemir Inject 12 Units into the skin at bedtime.   levETIRAcetam 250 MG tablet Commonly known as: KEPPRA TAKE 1 TABLET BY MOUTH TWICE A DAY   losartan 25 MG tablet Commonly known as: Cozaar Take 1 tablet (25 mg total) by mouth daily.   magnesium oxide 400 (240 Mg) MG tablet Commonly known as: MAG-OX TAKE 1 TABLET BY MOUTH TWICE A DAY   nitroGLYCERIN 0.4 MG SL tablet Commonly known as: NITROSTAT Place 1 tablet (0.4 mg total) under the tongue every 5 (five) minutes as needed for chest pain.   NovoLOG FlexPen 100 UNIT/ML FlexPen Generic drug: insulin aspart Inject up to 15 units daily under skin as advised What changed: Another medication with the same name was changed. Make sure you understand how and when to take each.   NovoLOG 100 UNIT/ML injection Generic drug: insulin aspart INJECT 10-16 UNITS INTO THE SKIN 3 (THREE) TIMES DAILY WITH MEALS. What changed: See the new instructions.   omeprazole 20 MG tablet Commonly known as: PRILOSEC OTC Take 20 mg by mouth every morning.   Ozempic (0.25 or 0.5 MG/DOSE) 2 MG/3ML Sopn Generic drug: Semaglutide(0.25 or 0.5MG /DOS) Inject 0.5 mg into the skin once a week.   rosuvastatin 40 MG tablet Commonly known as: CRESTOR TAKE 1 TABLET BY MOUTH EVERY DAY   sertraline 25 MG tablet Commonly known  as: ZOLOFT Take 1 tablet by mouth at bedtime.   Tyler Aas FlexTouch 100 UNIT/ML FlexTouch Pen Generic drug: insulin degludec Inject 12 Units into the skin daily.   Trulicity 3 0000000 Sopn Generic drug: Dulaglutide Inject 3 mg into the skin once a week.   Vitamin D3 50 MCG (2000 UT) Tabs Generic drug: Cholecalciferol Take 2,000 Units by mouth every morning.        Disposition:    Follow-up Information     Shirley Friar, PA-C Follow up.   Specialty: Cardiology Why: on 4/2 at [place] for post hospital check Contact information: [place] [address] [phone]         [facility] Follow up.   Specialty: Cardiology Why: on 4/1 at 1100 am for follow up Contact information: [address] [id number] [place] [phone]                Duration of Discharge Encounter: Greater than 30 minutes including physician time.  Signed, Shirley Friar, PA-C  06/15/2022 8:00 AM   I have seen and examined this patient with Oda Kilts.  Agree with above, note added to reflect my findings.  She has a past medical history as above.  She was admitted to the hospital with multiple episodes of ventricular tachycardia at times receiving ATP therapy.  She never converted to sinus rhythm.  She was reloaded on IV amiodarone with improved rhythm.  ICD parameters were changed.  She was noted to have volume overload and was diuresed.  She has had no further episodes of ventricular tachycardia.  [name] plan for discharge today.  On admission, she did have acute on chronic renal failure which has improved.  GEN: Well nourished, well developed, in no acute distress  HEENT: normal  Neck: no JVD, carotid bruits, or masses Cardiac: RRR; no murmurs, rubs, or gallops,no edema  Respiratory:  clear to auscultation bilaterally, normal work of breathing GI: soft, nontender,  nondistended, + BS MS: no deformity or atrophy  Skin: warm and dry, device site well healed Neuro:  Strength and sensation are intact Psych: euthymic mood, full affect     [name] M. Atheena Spano MD 06/15/2022 8:18 AM

## 2022-06-16 ENCOUNTER — Ambulatory Visit: Payer: Medicare HMO | Admitting: Neurology

## 2022-06-22 ENCOUNTER — Encounter (HOSPITAL_COMMUNITY): Payer: Self-pay

## 2022-06-22 ENCOUNTER — Ambulatory Visit
Admission: RE | Admit: 2022-06-22 | Discharge: 2022-06-22 | Disposition: A | Discharge status: Home / Self Care | Payer: Medicare HMO | Source: Ambulatory Visit | Attending: Family Medicine | Admitting: Family Medicine

## 2022-06-22 VITALS — BP 120/80 | HR 55 | Wt 189.0 lb

## 2022-06-22 DIAGNOSIS — E1143 Type 2 diabetes mellitus with diabetic autonomic (poly)neuropathy: Secondary | ICD-10-CM | POA: Insufficient documentation

## 2022-06-22 DIAGNOSIS — E785 Hyperlipidemia, unspecified: Secondary | ICD-10-CM | POA: Diagnosis not present

## 2022-06-22 DIAGNOSIS — Z7901 Long term (current) use of anticoagulants: Secondary | ICD-10-CM | POA: Insufficient documentation

## 2022-06-22 DIAGNOSIS — E039 Hypothyroidism, unspecified: Secondary | ICD-10-CM | POA: Insufficient documentation

## 2022-06-22 DIAGNOSIS — I255 Ischemic cardiomyopathy: Secondary | ICD-10-CM | POA: Diagnosis present

## 2022-06-22 DIAGNOSIS — Z95810 Presence of automatic (implantable) cardiac defibrillator: Secondary | ICD-10-CM | POA: Diagnosis not present

## 2022-06-22 DIAGNOSIS — I130 Hypertensive heart and chronic kidney disease with heart failure and stage 1 through stage 4 chronic kidney disease, or unspecified chronic kidney disease: Secondary | ICD-10-CM | POA: Diagnosis not present

## 2022-06-22 DIAGNOSIS — I472 Ventricular tachycardia, unspecified: Secondary | ICD-10-CM | POA: Diagnosis not present

## 2022-06-22 DIAGNOSIS — I2510 Atherosclerotic heart disease of native coronary artery without angina pectoris: Secondary | ICD-10-CM | POA: Diagnosis present

## 2022-06-22 DIAGNOSIS — E1122 Type 2 diabetes mellitus with diabetic chronic kidney disease: Secondary | ICD-10-CM | POA: Diagnosis not present

## 2022-06-22 DIAGNOSIS — I480 Paroxysmal atrial fibrillation: Secondary | ICD-10-CM | POA: Diagnosis not present

## 2022-06-22 DIAGNOSIS — Z79899 Other long term (current) drug therapy: Secondary | ICD-10-CM | POA: Diagnosis not present

## 2022-06-22 DIAGNOSIS — Z7984 Long term (current) use of oral hypoglycemic drugs: Secondary | ICD-10-CM | POA: Diagnosis not present

## 2022-06-22 DIAGNOSIS — I5022 Chronic systolic (congestive) heart failure: Secondary | ICD-10-CM | POA: Diagnosis not present

## 2022-06-22 DIAGNOSIS — N179 Acute kidney failure, unspecified: Secondary | ICD-10-CM | POA: Insufficient documentation

## 2022-06-22 DIAGNOSIS — N183 Chronic kidney disease, stage 3 unspecified: Secondary | ICD-10-CM | POA: Insufficient documentation

## 2022-06-22 LAB — BASIC METABOLIC PANEL
Anion gap: 10 (ref 5–15)
BUN: 28 mg/dL — ABNORMAL HIGH (ref 8–23)
CO2: 24 mmol/L (ref 22–32)
Calcium: 8.8 mg/dL — ABNORMAL LOW (ref 8.9–10.3)
Chloride: 107 mmol/L (ref 98–111)
Creatinine, Ser: 1.38 mg/dL — ABNORMAL HIGH (ref 0.44–1.00)
GFR, Estimated: 40 mL/min — ABNORMAL LOW (ref >60)
Glucose, Bld: 123 mg/dL — ABNORMAL HIGH (ref 70–99)
Potassium: 4.1 mmol/L (ref 3.5–5.1)
Sodium: 141 mmol/L (ref 135–145)

## 2022-06-22 LAB — MAGNESIUM: Magnesium: 2.1 mg/dL (ref 1.7–2.4)

## 2022-06-22 LAB — BRAIN NATRIURETIC PEPTIDE: B Natriuretic Peptide: 1422.7 pg/mL — ABNORMAL HIGH (ref 0.0–100.0)

## 2022-06-22 MED ORDER — FUROSEMIDE 20 MG PO TABS: 40.0000 mg | ORAL_TABLET | Freq: Every day | ORAL | 8 refills | Status: DC

## 2022-06-22 MED ORDER — POTASSIUM CHLORIDE CRYS ER 20 MEQ PO TBCR: 20.0000 meq | EXTENDED_RELEASE_TABLET | Freq: Every day | ORAL | 8 refills | Status: DC

## 2022-06-22 NOTE — Progress Notes (Addendum)
ID:  [name], DOB 20-Apr-1945, MRN [id number]  Provider location: [facility] Type of Visit: Established patient   PCP:  Luciano Cutter, MD  HF Cardiologist:  Dr. Aundra Dubin   HPI: [name] is a 77 y.o. female who has a history of CAD, ischemic cardiomyopathy, and atrial fibrillation.  Patient was hospitalized in 11/12 at [facility] for VT with ICD discharge.  She had a left heart cath showing patent stents.  EF was 35-40% by echo.  She is on amiodarone.  She has had periodic problems with creatinine rising with medication adjustments.  Echo in 12/18 showed EF 35-40%, inferior and septal hypokinesis, mild MR.    Echo in 5/20 showed EF 40%, normal RV, mild AI.   She did not tolerate eplerenone due to nausea.  Echo in 4/21 showed EF 40-45% with inferior/inferoseptal HK, normal RV.   In 5/22, she had COVID-19 infection.    Echo 10/22 EF 40% with basal-mid inferior and inferoseptal severe hypokinesis, normal RV, mild AI.   Notified by [facility] 04/24/21 that she had VT, with shock.    In 6/23, she was admitted with altered mental status.  CT showed old right frontal infarct.  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  Echo in 6/23 showed EF 40%, mild LVH, normal IVC.   Follow up 9/23, NYHA II and volume stable. Entresto increased to 97/103.  Admitted 3/24 with VT storm, also noted to have AKI and hyperkalemia. Given Lokelma and GDMT held. She was cardioverted in ED. She was loaded with IV amiodarone, VT zones were changed. Echo showed EF 25-30%. Continued on amiodarone 200 mg bid and discharged home.  Today she returns for HF follow up. Overall feeling fine but remains fatigued. She has increased LE swelling. She is not short of breath walking on flat ground or with ADLs. Denies palpitations, CP, dizziness, or PND/Orthopnea. Appetite ok. No fever or chills. Weight at home 185 pounds. Taking all medications.   St Jude device interrogation (personally reviewed): CoreVue  down suggesting volume up, A-paced 67%, V-paced 13%, AT/AF burden 21%.  ECG (personally reviewed): A-paced NSR, 1st degree AVB, LVH; 58 bpm  Labs (1/19): LDL 71, HDL 58, K 4.8, creatinine 1.26, LFTs normal, hgb 12.9.  Labs (2/19): Creatinine 1.35, K 4.9 Labs (6/19): K 4.4, creatinine 1.57 Labs (8/19): K 5, creatinine 1.59, LFTs normal Labs (2/20): K 4.4, creatinine 1.4, LDL 72, HDL 55, normal LFTs Labs (5/20): LDL 81, K 4.8, creatinine 1.69 Labs (10/20): hgb 13.6, K 4.2, creatinine 1.62, LFTs normal, LDL 81, HDL 50 Labs (12/20): LDL 88 Labs (3/21): K 4, creatinine 1.56 Labs (5/22): K 4, creatinine 1.64 Labs (7/22): LDL 76 Labs (10/22): K 4.3, creatinine 1.40, LFTs normal, TSH low (Levoxyl stopped). Labs (6/23): LDL 81, K 4.3, creatinine 1.78 Labs (3/24): K 3.9, creatinine 1.91, LFTs normal, TSH normal  PMH: 1. Diabetes mellitus 2. CVA, TIA in 2010.   3. HTN 4. CKD stage 3 5. H/o TAH 6. H/o CCY 7. Sciatica 8. Atrial fibrillation 9. CAD: s/p LAD and RCA PCI.  Last LHC in 11/12 with patent proximal LAD stent, ostial 70% D1 (jailed by stent), mild LAD stent patent, patent RCA stents, EF 40% with global hypokinesis.  10. Ischemic cardiomyopathy: Echo (10/12): EF 35-40%, moderate focal basal septal hypertrophy, inferior akinesis, grade I diastolic dysfunction, moderate aortic insufficiency. St Jude dual chamber ICD.  Spironolactone stopped when creatinine rose to 2.5.  Echo (5/14) with EF 40-45%, mild AI.  Echo (  6/16) with EF 40-45%, inferior/inferoseptal hypokinesis, mild LVH.  - Echo (12/18): EF 35-40%, inferior and septal hypokinesis, mild MR, mild AI.  - Echo (5/20): EF 40%, inferior and inferoseptal hypokinesis, normal RV size and systolic function, mild AI, normal IVC.  - Unable to tolerate eplerenone.  - Echo (4/21): EF 40-45%, inferior/inferoseptal HK, normal RV, mild AI.  - Echo (10/22): EF 40% with basal-mid inferior and inferoseptal severe hypokinesis, normal RV, mild AI.  -  Echo (6/23): EF 40%, mild LVH, IVC normal - Echo (3/24): EF 25-30%, grade I DD, RV normal, moderate MR 11. Aortic insufficiency: moderate in past but mild on most recent echo.   12. 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  13. Gastroparesis 14. Hypothyroidism 15. COVID-19 5/22 16. Depression 17. Seizure disorder: Suspected to be related to prior CVA.  18. CTA head/neck (6/23): No significant carotid disease.   SH: Divorced.  3 children.  Quit smoking in 1997. Lives in [place] now with daughter.  [place].   FH: CAD  ROS: All systems reviewed and negative except as per HPI.   Current Outpatient Medications  Medication Sig Dispense Refill   acetaminophen (TYLENOL) 325 MG tablet Take 2 tablets (650 mg total) by mouth every 4 (four) hours as needed for mild pain or moderate pain.     amiodarone (PACERONE) 200 MG tablet Take 200 mg by mouth 2 (two) times daily. 2 tablets twice daily     Blood Glucose Monitoring Suppl (ACCU-CHEK NANO SMARTVIEW) W/DEVICE KIT Use to test blood sugar 4 times daily as instructed. Dx code: E11.59 1 kit 0   busPIRone (BUSPAR) 5 MG tablet Take 1 tablet (5 mg total) by mouth 2 (two) times daily. 60 tablet 1   carvedilol (COREG) 12.5 MG tablet TAKE 1 TABLET (12.5 MG TOTAL) BY MOUTH 2 (TWO) TIMES DAILY WITH A MEAL. 180 tablet 3   Cholecalciferol (VITAMIN D3) 2000 UNITS TABS Take 2,000 Units by mouth every morning.     dicyclomine (BENTYL) 10 MG capsule TAKE 1 CAPSULE (10 MG TOTAL) BY MOUTH 4 (FOUR) TIMES DAILY BEFORE MEALS AND AT BEDTIME. 120 capsule 1   Dulaglutide (TRULICITY) 3 0000000 SOPN Inject 3 mg into the skin once a week. 2 mL 11   ELIQUIS 5 MG TABS tablet TAKE 1 TABLET BY MOUTH TWICE A DAY (Patient taking differently: Take 5 mg by mouth 2 (two) times daily.) 60 tablet 11   empagliflozin (JARDIANCE) 10 MG TABS tablet Take 1 tablet (10 mg total) by mouth every morning. 90 tablet 3   fluticasone (FLONASE) 50 MCG/ACT nasal spray Place 1 spray into both nostrils  daily as needed for allergies or rhinitis.     furosemide (LASIX) 20 MG tablet TAKE 1 TABLET BY MOUTH EVERY DAY 90 tablet 1   gabapentin (NEURONTIN) 100 MG capsule TAKE 2 CAPSULES BY MOUTH TWICE A DAY 360 capsule 0   insulin aspart (NOVOLOG FLEXPEN) 100 UNIT/ML FlexPen Inject up to 15 units daily under skin as advised 15 mL 11   insulin degludec (TRESIBA FLEXTOUCH) 100 UNIT/ML FlexTouch Pen Inject 12 Units into the skin daily. 9 mL 3   insulin detemir (LEVEMIR) 100 UNIT/ML injection Inject 12 Units into the skin at bedtime.     levETIRAcetam (KEPPRA) 250 MG tablet TAKE 1 TABLET BY MOUTH TWICE A DAY 180 tablet 1   losartan (COZAAR) 25 MG tablet Take 1 tablet (25 mg total) by mouth daily. 30 tablet 11   magnesium oxide (MAG-OX) 400 (240 Mg) MG tablet  TAKE 1 TABLET BY MOUTH TWICE A DAY (Patient taking differently: Take 400 mg by mouth 2 (two) times daily.) 180 tablet 1   Multiple Vitamins-Minerals (EYE VITAMINS PO) Take 1 tablet by mouth 2 (two) times daily.     nitroGLYCERIN (NITROSTAT) 0.4 MG SL tablet Place 1 tablet (0.4 mg total) under the tongue every 5 (five) minutes as needed for chest pain. 25 tablet 1   NOVOLOG 100 UNIT/ML injection INJECT 10-16 UNITS INTO THE SKIN 3 (THREE) TIMES DAILY WITH MEALS. (Patient taking differently: Inject 10-16 Units into the skin 3 (three) times daily with meals. Per sliding scale) 10 mL 20   omeprazole (PRILOSEC OTC) 20 MG tablet Take 20 mg by mouth every morning.     rosuvastatin (CRESTOR) 40 MG tablet TAKE 1 TABLET BY MOUTH EVERY DAY 90 tablet 3   Semaglutide,0.25 or 0.5MG /DOS, (OZEMPIC, 0.25 OR 0.5 MG/DOSE,) 2 MG/3ML SOPN Inject 0.5 mg into the skin once a week. 3 mL 2   sertraline (ZOLOFT) 25 MG tablet Take 1 tablet by mouth at bedtime.     No current facility-administered medications for this encounter.   Wt Readings from Last 3 Encounters:  06/22/22 85.7 kg (189 lb)  06/15/22 84.7 kg (186 lb 11.2 oz)  03/13/22 82.9 kg (182 lb 12.8 oz)   BP 120/80    Pulse (!) 55   Wt 85.7 kg (189 lb)   SpO2 97%   BMI 33.48 kg/m  General:  NAD. No resp difficulty, walked into clinic HEENT: Normal Neck: Supple. No JVD. Carotids 2+ bilat; no bruits. No lymphadenopathy or thryomegaly appreciated. Cor: PMI nondisplaced. Regular rate & rhythm. No rubs, gallops or murmurs. Lungs: Clear Abdomen: Soft, nontender, nondistended. No hepatosplenomegaly. No bruits or masses. Good bowel sounds. Extremities: No cyanosis, clubbing, rash, 1-2+ BLE pre-tibial edema Neuro: Alert & oriented x 3, cranial nerves grossly intact. Moves all 4 extremities w/o difficulty. Affect pleasant.  Assessment/Plan 1. Atrial fibrillation: Paroxysmal.   - Continue amiodarone. TSH and LFTs ok (3/24). She will need regular eye exams.  - Continue Eliquis 5 mg bid. No bleeding issues. CBC today. 2. Coronary artery disease: No chest pain.  Nonobstructive disease on last cath.  - Continue statin, good lipids 9/23. - No ASA given Eliquis use.   3. Ventricular tachycardia: Recent admission for VT storm. Re-loaded with IV amio, V zones changes. No further events on device interrogation today. She has EP follow tomorrow. - Check Magnesium today. - Continue amiodarone 200 mg bid. - No driving x 6 months ([id number]) 4. Chronic systolic CHF: Ischemic cardiomyopathy.  EF 40% on 6/23 echo. She has a [employer] ICD. Echo (3/24) EF 25-30%, no RWMAs. Suspect recent reduction in EF related to VT storm. No indication for cath at this time with no chest pain, and recent AKI. NYHA class II symptoms, limited most by orthopedic pain.  She is volume overloaded by exam and Corvue. GDMT limited by recent AKI. - Place compression hose. - Increase Lasix to 40 mg daily, add 20 KCL daily. BMET/BNP today.  - Continue losartan 25 mg daily (Entresto stopped with AKI).  - Continue Coreg 12.5 mg bid.   - She has tolerated neither spironolactone nor eplerenone.  - Continue Jardiance 10 mg daily.  5. CKD stage 3: BMET today.   6. Hypothyroidism: Managed by PCP. Now off levothyroxine. 7. Hyperlipidemia: On Crestor, LDL 66 (9/23)  Follow up in 2-3 weeks with APP (check BMET) for fluid check. Plan to re-check echo in 2-3  months.  Signed, Rafael Bihari, FNP  06/22/2022  [facility] [address] [facility] [place] [address] [phone] (office) [phone] (fax)

## 2022-06-22 NOTE — Progress Notes (Unsigned)
  Electrophysiology Office Note:   Date:  06/23/2022  ID:  [name], DOB 08/07/1945, MRN [id number]  Primary Cardiologist: Loralie Champagne, MD Electrophysiologist: Vickie Epley, MD   History of Present Illness:   [name] is a 77 y.o. female with h/o CAD, ICM, AF, h/o VT, Chronic systolic CHF, hypothyroidism, HLD, and h/o CVA seen today for post hospital follow up.    Admitted 3/21 - 06/15/22 for VT storm. Noted to have AKI and Hyperkalemia  VT 1 zone (monitor only) rate reduced to 139bpm VT 2 zone rate reduced to 164bpm  Since discharge from hospital the patient reports doing better. Having some allergies from all the pollen, but otherwise doing well. She hasn't noticed much SOB, but has had some nausea on the increased amiodarone. Manageable. Has had similar symptoms when her K has been too high in the past. She does have some fatigue, but has not been very active since leaving this hospital.   Review of systems complete and found to be negative unless listed in HPI.   Device History: SJM dual chamber ICD, implanted 2003,  2007, gen changes 2013, Jan 2022 2003 had syncope >> EP study with inducible PMVT + h/o PMVT 2012 with ICD shocks 04/24/21 appropriate tx for VT w/HV tx   At the time of her gen change Jan 2022,  SJM [address] 7040 lead was fluoroscopically and electrically normal today, opted to use this lead rather than replacing it after a long discussion with the patient prior to the procedure.     AAD Hx 2007 amiodarone started quickly stopped 2/2 nausea Remotely as well tried on Sotalol and Multaq unclear when/why they were stopped 2012 restarted on amiodarone  Studies Reviewed:    ICD Interrogation-  reviewed in detail today,  See PACEART report.  EKG is not ordered today. EKG from 06/22/2022 reviewed which showed AV dual paced rhythm at 59 bpm     Risk Assessment/Calculations:    CHA2DS2-VASc Score = 7            Physical Exam:   VS:  BP  126/74   Pulse 67   Ht 5\' 3"  (1.6 m)   Wt 187 lb 9.6 oz (85.1 kg)   SpO2 95%   BMI 33.23 kg/m    Wt Readings from Last 3 Encounters:  06/23/22 187 lb 9.6 oz (85.1 kg)  06/22/22 189 lb (85.7 kg)  06/15/22 186 lb 11.2 oz (84.7 kg)     GEN: Well nourished, well developed in no acute distress NECK: No JVD; No carotid bruits CARDIAC: Regular rate and rhythm, no murmurs, rubs, gallops RESPIRATORY:  Clear to auscultation without rales, wheezing or rhonchi  ABDOMEN: Soft, non-tender, non-distended EXTREMITIES:  No edema; No deformity   ASSESSMENT AND PLAN:    Chronic systolic dysfunction s/p Abbott dual chamber ICD  NICM euvolemic today Stable on an appropriate medical regimen Normal ICD function See Pace Art report No changes today  VT storm  No further by device today  Continue amiodarone 200 mg BID for now.  Labs stable at HF visit yesterday.   AF/AFL Continue eliquis CHA2DS2VASc at least 7.  CKD III Labs today  CAD No s/s ischemia  Continue statin. No ASA on eliquis  Disposition:   Follow up with Dr. Curt Bears as scheduled at the end of the month.   Signed, Shirley Friar, PA-C

## 2022-06-22 NOTE — Addendum Note (Signed)
Encounter addended by: Rafael Bihari, FNP on: 06/22/2022 3:42 PM  Actions taken: Clinical Note Signed

## 2022-06-22 NOTE — Patient Instructions (Signed)
Thank you for coming in today  Labs were done today, if any labs are abnormal the clinic will call you No news is good news  EKG was done today   Medications: Increase Lasix to 40 mg 2 tablets daily  Start Potassium 20 meq 1 tablet daily    Follow up appointments:  Your physician recommends that you schedule a follow-up appointment in:  3 weeks in clinic  2 months with Dr. Aundra Dubin with echocardiogram  Your physician has requested that you have an echocardiogram. Echocardiography is a painless test that uses sound waves to create images of your heart. It provides your doctor with information about the size and shape of your heart and how well your heart's chambers and valves are working. This procedure takes approximately one hour. There are no restrictions for this procedure.   You have been given a prescription for compression hose. And a list of places who supply them. Please wear your compression hose daily, place them on as soon as you get up in the morning and remove before you go to bed at night.     Do the following things EVERYDAY: Weigh yourself in the morning before breakfast. Write it down and keep it in a log. Take your medicines as prescribed Eat low salt foods--Limit salt (sodium) to 2000 mg per day.  Stay as active as you can everyday Limit all fluids for the day to less than 2 liters   At the [facility], you and your health needs are our priority. As part of our continuing mission to provide you with exceptional heart care, we have created designated Provider Care Teams. These Care Teams include your primary Cardiologist (physician) and Advanced Practice Providers (APPs- Physician Assistants and Nurse Practitioners) who all work together to provide you with the care you need, when you need it.   You may see any of the following providers on your designated Care Team at your next follow up: Dr Glori Bickers Dr Loralie Champagne Dr. Roxana Hires, NP Lyda Jester, [place] [facility] [place], [place] Forestine Na, NP Audry Riles, PharmD   Please be sure to bring in all your medications bottles to every appointment.    Thank you for choosing [facility]  If you have any questions or concerns before your next appointment please send [name] a message through [place] or call our office at [phone].    TO LEAVE A MESSAGE FOR THE NURSE SELECT OPTION 2, PLEASE LEAVE A MESSAGE INCLUDING: YOUR NAME DATE OF BIRTH CALL BACK NUMBER REASON FOR CALL**this is important as we prioritize the call backs  YOU WILL RECEIVE A CALL BACK THE SAME DAY AS LONG AS YOU CALL BEFORE 4:00 PM

## 2022-06-23 ENCOUNTER — Encounter: Payer: Self-pay | Admitting: Student

## 2022-06-23 ENCOUNTER — Ambulatory Visit (INDEPENDENT_AMBULATORY_CARE_PROVIDER_SITE_OTHER): Payer: Medicare HMO | Admitting: Student

## 2022-06-23 VITALS — BP 126/74 | HR 67 | Ht 63.0 in | Wt 187.6 lb

## 2022-06-23 DIAGNOSIS — I480 Paroxysmal atrial fibrillation: Secondary | ICD-10-CM | POA: Diagnosis not present

## 2022-06-23 DIAGNOSIS — I472 Ventricular tachycardia, unspecified: Secondary | ICD-10-CM

## 2022-06-23 DIAGNOSIS — I2510 Atherosclerotic heart disease of native coronary artery without angina pectoris: Secondary | ICD-10-CM

## 2022-06-23 DIAGNOSIS — I255 Ischemic cardiomyopathy: Secondary | ICD-10-CM

## 2022-06-23 DIAGNOSIS — I5022 Chronic systolic (congestive) heart failure: Secondary | ICD-10-CM | POA: Diagnosis not present

## 2022-06-23 LAB — CUP PACEART INCLINIC DEVICE CHECK
Battery Remaining Longevity: 72 mo
Brady Statistic RA Percent Paced: 66 %
Brady Statistic RV Percent Paced: 14 %
Date Time Interrogation Session: 20240402084758
HighPow Impedance: 48.6524
Implantable Lead Connection Status: 753985
Implantable Lead Connection Status: 753985
Implantable Lead Implant Date: 20070117
Implantable Lead Implant Date: 20070117
Implantable Lead Location: 753859
Implantable Lead Location: 753860
Implantable Lead Model: 7040
Implantable Pulse Generator Implant Date: 20220120
Lead Channel Impedance Value: 450 Ohm
Lead Channel Impedance Value: 487.5 Ohm
Lead Channel Pacing Threshold Amplitude: 0.75 V
Lead Channel Pacing Threshold Amplitude: 0.75 V
Lead Channel Pacing Threshold Amplitude: 0.75 V
Lead Channel Pacing Threshold Amplitude: 0.75 V
Lead Channel Pacing Threshold Pulse Width: 0.5 ms
Lead Channel Pacing Threshold Pulse Width: 0.5 ms
Lead Channel Pacing Threshold Pulse Width: 0.5 ms
Lead Channel Pacing Threshold Pulse Width: 0.5 ms
Lead Channel Sensing Intrinsic Amplitude: 11.7 mV
Lead Channel Sensing Intrinsic Amplitude: 5 mV
Lead Channel Setting Pacing Amplitude: 2.5 V
Lead Channel Setting Pacing Amplitude: 2.5 V
Lead Channel Setting Pacing Pulse Width: 0.5 ms
Lead Channel Setting Sensing Sensitivity: 0.5 mV
Pulse Gen Serial Number: 9976817
Zone Setting Status: 755011

## 2022-06-23 NOTE — Patient Instructions (Signed)
Medication Instructions:  Your physician recommends that you continue on your current medications as directed. Please refer to the Current Medication list given to you today.  *If you need a refill on your cardiac medications before your next appointment, please call your pharmacy*  Lab Work: None If you have labs (blood work) drawn today and your tests are completely normal, you will receive your results only by: [place] (if you have MyChart) OR A paper copy in the mail If you have any lab test that is abnormal or we need to change your treatment, we will call you to review the results.  Follow-Up: At [facility], you and your health needs are our priority.  As part of our continuing mission to provide you with exceptional heart care, we have created designated Provider Care Teams.  These Care Teams include your primary Cardiologist (physician) and Advanced Practice Providers (APPs -  Physician Assistants and Nurse Practitioners) who all work together to provide you with the care you need, when you need it.  Your next appointment:   07/21/2022 at 11:45 AM  Provider:   Allegra Lai, MD

## 2022-06-24 ENCOUNTER — Other Ambulatory Visit: Payer: Self-pay

## 2022-06-24 DIAGNOSIS — E1142 Type 2 diabetes mellitus with diabetic polyneuropathy: Secondary | ICD-10-CM

## 2022-06-24 DIAGNOSIS — E1165 Type 2 diabetes mellitus with hyperglycemia: Secondary | ICD-10-CM

## 2022-06-24 MED ORDER — NOVOLOG FLEXPEN 100 UNIT/ML ~~LOC~~ SOPN: PEN_INJECTOR | SUBCUTANEOUS | 11 refills | Status: DC

## 2022-06-25 MED ORDER — GABAPENTIN 100 MG PO CAPS: 200.0000 mg | ORAL_CAPSULE | Freq: Two times a day (BID) | ORAL | 3 refills | Status: DC

## 2022-06-25 NOTE — Addendum Note (Signed)
Encounter addended by: Rafael Bihari, FNP on: 06/25/2022 5:16 PM  Actions taken: Clinical Note Signed

## 2022-06-25 NOTE — Addendum Note (Signed)
Addended by: Lauralyn Primes on: 06/25/2022 05:57 PM   Modules accepted: Orders

## 2022-07-06 ENCOUNTER — Other Ambulatory Visit (HOSPITAL_COMMUNITY): Payer: Self-pay

## 2022-07-13 NOTE — Progress Notes (Signed)
ID:  [name], DOB 20-Apr-1945, MRN [id number]  Provider location: [facility] Type of Visit: Established patient   PCP:  Luciano Cutter, MD  HF Cardiologist:  Dr. Aundra Dubin   HPI: [name] is a 77 y.o. female who has a history of CAD, ischemic cardiomyopathy, and atrial fibrillation.  Patient was hospitalized in 11/12 at [facility] for VT with ICD discharge.  She had a left heart cath showing patent stents.  EF was 35-40% by echo.  She is on amiodarone.  She has had periodic problems with creatinine rising with medication adjustments.  Echo in 12/18 showed EF 35-40%, inferior and septal hypokinesis, mild MR.    Echo in 5/20 showed EF 40%, normal RV, mild AI.   She did not tolerate eplerenone due to nausea.  Echo in 4/21 showed EF 40-45% with inferior/inferoseptal HK, normal RV.   In 5/22, she had COVID-19 infection.    Echo 10/22 EF 40% with basal-mid inferior and inferoseptal severe hypokinesis, normal RV, mild AI.   Notified by [facility] 04/24/21 that she had VT, with shock.    In 6/23, she was admitted with altered mental status.  CT showed old right frontal infarct.  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  Echo in 6/23 showed EF 40%, mild LVH, normal IVC.   Follow up 9/23, NYHA II and volume stable. Entresto increased to 97/103.  Admitted 3/24 with VT storm, also noted to have AKI and hyperkalemia. Given Lokelma and GDMT held. She was cardioverted in ED. She was loaded with IV amiodarone, VT zones were changed. Echo showed EF 25-30%. Continued on amiodarone 200 mg bid and discharged home.  Today she returns for HF follow up. Overall feeling fine but remains fatigued. She has increased LE swelling. She is not short of breath walking on flat ground or with ADLs. Denies palpitations, CP, dizziness, or PND/Orthopnea. Appetite ok. No fever or chills. Weight at home 185 pounds. Taking all medications.   St Jude device interrogation (personally reviewed): CoreVue  down suggesting volume up, A-paced 67%, V-paced 13%, AT/AF burden 21%.  ECG (personally reviewed): A-paced NSR, 1st degree AVB, LVH; 58 bpm  Labs (1/19): LDL 71, HDL 58, K 4.8, creatinine 1.26, LFTs normal, hgb 12.9.  Labs (2/19): Creatinine 1.35, K 4.9 Labs (6/19): K 4.4, creatinine 1.57 Labs (8/19): K 5, creatinine 1.59, LFTs normal Labs (2/20): K 4.4, creatinine 1.4, LDL 72, HDL 55, normal LFTs Labs (5/20): LDL 81, K 4.8, creatinine 1.69 Labs (10/20): hgb 13.6, K 4.2, creatinine 1.62, LFTs normal, LDL 81, HDL 50 Labs (12/20): LDL 88 Labs (3/21): K 4, creatinine 1.56 Labs (5/22): K 4, creatinine 1.64 Labs (7/22): LDL 76 Labs (10/22): K 4.3, creatinine 1.40, LFTs normal, TSH low (Levoxyl stopped). Labs (6/23): LDL 81, K 4.3, creatinine 1.78 Labs (3/24): K 3.9, creatinine 1.91, LFTs normal, TSH normal  PMH: 1. Diabetes mellitus 2. CVA, TIA in 2010.   3. HTN 4. CKD stage 3 5. H/o TAH 6. H/o CCY 7. Sciatica 8. Atrial fibrillation 9. CAD: s/p LAD and RCA PCI.  Last LHC in 11/12 with patent proximal LAD stent, ostial 70% D1 (jailed by stent), mild LAD stent patent, patent RCA stents, EF 40% with global hypokinesis.  10. Ischemic cardiomyopathy: Echo (10/12): EF 35-40%, moderate focal basal septal hypertrophy, inferior akinesis, grade I diastolic dysfunction, moderate aortic insufficiency. St Jude dual chamber ICD.  Spironolactone stopped when creatinine rose to 2.5.  Echo (5/14) with EF 40-45%, mild AI.  Echo (  6/16) with EF 40-45%, inferior/inferoseptal hypokinesis, mild LVH.  - Echo (12/18): EF 35-40%, inferior and septal hypokinesis, mild MR, mild AI.  - Echo (5/20): EF 40%, inferior and inferoseptal hypokinesis, normal RV size and systolic function, mild AI, normal IVC.  - Unable to tolerate eplerenone.  - Echo (4/21): EF 40-45%, inferior/inferoseptal HK, normal RV, mild AI.  - Echo (10/22): EF 40% with basal-mid inferior and inferoseptal severe hypokinesis, normal RV, mild AI.  -  Echo (6/23): EF 40%, mild LVH, IVC normal - Echo (3/24): EF 25-30%, grade I DD, RV normal, moderate MR 11. Aortic insufficiency: moderate in past but mild on most recent echo.   12. 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  13. Gastroparesis 14. Hypothyroidism 15. COVID-19 5/22 16. Depression 17. Seizure disorder: Suspected to be related to prior CVA.  18. CTA head/neck (6/23): No significant carotid disease.   SH: Divorced.  3 children.  Quit smoking in 1997. Lives in [place] now with daughter.  [place].   FH: CAD  ROS: All systems reviewed and negative except as per HPI.   Current Outpatient Medications  Medication Sig Dispense Refill   acetaminophen (TYLENOL) 325 MG tablet Take 2 tablets (650 mg total) by mouth every 4 (four) hours as needed for mild pain or moderate pain.     amiodarone (PACERONE) 200 MG tablet Take 200 mg by mouth 2 (two) times daily. 2 tablets twice daily     Blood Glucose Monitoring Suppl (ACCU-CHEK NANO SMARTVIEW) W/DEVICE KIT Use to test blood sugar 4 times daily as instructed. Dx code: E11.59 1 kit 0   busPIRone (BUSPAR) 5 MG tablet Take 1 tablet (5 mg total) by mouth 2 (two) times daily. 60 tablet 1   carvedilol (COREG) 12.5 MG tablet TAKE 1 TABLET (12.5 MG TOTAL) BY MOUTH 2 (TWO) TIMES DAILY WITH A MEAL. 180 tablet 3   Cholecalciferol (VITAMIN D3) 2000 UNITS TABS Take 2,000 Units by mouth every morning.     dicyclomine (BENTYL) 10 MG capsule TAKE 1 CAPSULE (10 MG TOTAL) BY MOUTH 4 (FOUR) TIMES DAILY BEFORE MEALS AND AT BEDTIME. 120 capsule 1   Dulaglutide (TRULICITY) 3 MG/0.5ML SOPN Inject 3 mg into the skin once a week. 2 mL 11   ELIQUIS 5 MG TABS tablet TAKE 1 TABLET BY MOUTH TWICE A DAY 60 tablet 11   empagliflozin (JARDIANCE) 10 MG TABS tablet Take 1 tablet (10 mg total) by mouth every morning. 90 tablet 3   fluticasone (FLONASE) 50 MCG/ACT nasal spray Place 1 spray into both nostrils daily as needed for allergies or rhinitis.     furosemide (LASIX) 20 MG  tablet Take 2 tablets (40 mg total) by mouth daily. 60 tablet 8   gabapentin (NEURONTIN) 100 MG capsule Take 2 capsules (200 mg total) by mouth 2 (two) times daily. 360 capsule 3   insulin aspart (NOVOLOG FLEXPEN) 100 UNIT/ML FlexPen Inject up to 15 units daily under skin as advised 15 mL 11   insulin degludec (TRESIBA FLEXTOUCH) 100 UNIT/ML FlexTouch Pen Inject 12 Units into the skin daily. 9 mL 3   insulin detemir (LEVEMIR) 100 UNIT/ML injection Inject 12 Units into the skin at bedtime.     levETIRAcetam (KEPPRA) 250 MG tablet TAKE 1 TABLET BY MOUTH TWICE A DAY 180 tablet 1   losartan (COZAAR) 25 MG tablet Take 1 tablet (25 mg total) by mouth daily. 30 tablet 11   magnesium oxide (MAG-OX) 400 (240 Mg) MG tablet TAKE 1 TABLET BY MOUTH TWICE  A DAY 180 tablet 1   Multiple Vitamins-Minerals (EYE VITAMINS PO) Take 1 tablet by mouth 2 (two) times daily.     nitroGLYCERIN (NITROSTAT) 0.4 MG SL tablet Place 1 tablet (0.4 mg total) under the tongue every 5 (five) minutes as needed for chest pain. 25 tablet 1   NOVOLOG 100 UNIT/ML injection INJECT 10-16 UNITS INTO THE SKIN 3 (THREE) TIMES DAILY WITH MEALS. 10 mL 20   omeprazole (PRILOSEC OTC) 20 MG tablet Take 20 mg by mouth every morning.     potassium chloride SA (KLOR-CON M) 20 MEQ tablet Take 1 tablet (20 mEq total) by mouth daily. 30 tablet 8   rosuvastatin (CRESTOR) 40 MG tablet TAKE 1 TABLET BY MOUTH EVERY DAY 90 tablet 3   Semaglutide,0.25 or 0.5MG /DOS, (OZEMPIC, 0.25 OR 0.5 MG/DOSE,) 2 MG/3ML SOPN Inject 0.5 mg into the skin once a week. 3 mL 2   sertraline (ZOLOFT) 25 MG tablet Take 1 tablet by mouth at bedtime.     No current facility-administered medications for this visit.   Wt Readings from Last 3 Encounters:  06/23/22 85.1 kg (187 lb 9.6 oz)  06/22/22 85.7 kg (189 lb)  06/15/22 84.7 kg (186 lb 11.2 oz)   There were no vitals taken for this visit. General:  NAD. No resp difficulty, walked into clinic HEENT: Normal Neck: Supple. No  JVD. Carotids 2+ bilat; no bruits. No lymphadenopathy or thryomegaly appreciated. Cor: PMI nondisplaced. Regular rate & rhythm. No rubs, gallops or murmurs. Lungs: Clear Abdomen: Soft, nontender, nondistended. No hepatosplenomegaly. No bruits or masses. Good bowel sounds. Extremities: No cyanosis, clubbing, rash, 1-2+ BLE pre-tibial edema Neuro: Alert & oriented x 3, cranial nerves grossly intact. Moves all 4 extremities w/o difficulty. Affect pleasant.  Assessment/Plan 1. Atrial fibrillation: Paroxysmal.   - Continue amiodarone. TSH and LFTs ok (3/24). She will need regular eye exams.  - Continue Eliquis 5 mg bid. No bleeding issues. CBC today. 2. Coronary artery disease: No chest pain.  Nonobstructive disease on last cath.  - Continue statin, good lipids 9/23. - No ASA given Eliquis use.   3. Ventricular tachycardia: Recent admission for VT storm. Re-loaded with IV amio, V zones changes. No further events on device interrogation today. She has EP follow tomorrow. - Check Magnesium today. - Continue amiodarone 200 mg bid. - No driving x 6 months (~5/78) 4. Chronic systolic CHF: Ischemic cardiomyopathy.  EF 40% on 6/23 echo. She has a [employer] ICD. Echo (3/24) EF 25-30%, no RWMAs. Suspect recent reduction in EF related to VT storm. No indication for cath at this time with no chest pain, and recent AKI. NYHA class II symptoms, limited most by orthopedic pain.  She is volume overloaded by exam and Corvue. GDMT limited by recent AKI. - Place compression hose. - Increase Lasix to 40 mg daily, add 20 KCL daily. BMET/BNP today.  - Continue losartan 25 mg daily (Entresto stopped with AKI).  - Continue Coreg 12.5 mg bid.   - She has tolerated neither spironolactone nor eplerenone.  - Continue Jardiance 10 mg daily.  5. CKD stage 3: BMET today.  6. Hypothyroidism: Managed by PCP. Now off levothyroxine. 7. Hyperlipidemia: On Crestor, LDL 66 (9/23)  Follow up in 2-3 weeks with APP (check BMET) for  fluid check. Plan to re-check echo in 2-3 months.  Signed, Jacklynn Ganong, FNP  07/13/2022  [facility] [address] Heart and Vascular [place] [address] [phone] (office) [phone] (fax)

## 2022-07-15 ENCOUNTER — Ambulatory Visit (HOSPITAL_COMMUNITY)
Admission: RE | Admit: 2022-07-15 | Discharge: 2022-07-15 | Disposition: A | Discharge status: Home / Self Care | Payer: Medicare HMO | Source: Ambulatory Visit | Attending: Family Medicine | Admitting: Family Medicine

## 2022-07-15 ENCOUNTER — Encounter (HOSPITAL_COMMUNITY): Payer: Self-pay

## 2022-07-15 ENCOUNTER — Ambulatory Visit (INDEPENDENT_AMBULATORY_CARE_PROVIDER_SITE_OTHER): Payer: Medicare HMO

## 2022-07-15 VITALS — BP 110/62 | HR 62 | Wt 187.4 lb

## 2022-07-15 DIAGNOSIS — E039 Hypothyroidism, unspecified: Secondary | ICD-10-CM | POA: Diagnosis not present

## 2022-07-15 DIAGNOSIS — Z7901 Long term (current) use of anticoagulants: Secondary | ICD-10-CM | POA: Insufficient documentation

## 2022-07-15 DIAGNOSIS — I5022 Chronic systolic (congestive) heart failure: Secondary | ICD-10-CM | POA: Diagnosis not present

## 2022-07-15 DIAGNOSIS — R42 Dizziness and giddiness: Secondary | ICD-10-CM | POA: Insufficient documentation

## 2022-07-15 DIAGNOSIS — M7989 Other specified soft tissue disorders: Secondary | ICD-10-CM | POA: Diagnosis not present

## 2022-07-15 DIAGNOSIS — K580 Irritable bowel syndrome with diarrhea: Secondary | ICD-10-CM | POA: Insufficient documentation

## 2022-07-15 DIAGNOSIS — Z955 Presence of coronary angioplasty implant and graft: Secondary | ICD-10-CM | POA: Insufficient documentation

## 2022-07-15 DIAGNOSIS — Z7984 Long term (current) use of oral hypoglycemic drugs: Secondary | ICD-10-CM | POA: Diagnosis not present

## 2022-07-15 DIAGNOSIS — I130 Hypertensive heart and chronic kidney disease with heart failure and stage 1 through stage 4 chronic kidney disease, or unspecified chronic kidney disease: Secondary | ICD-10-CM | POA: Insufficient documentation

## 2022-07-15 DIAGNOSIS — I255 Ischemic cardiomyopathy: Secondary | ICD-10-CM | POA: Diagnosis not present

## 2022-07-15 DIAGNOSIS — I480 Paroxysmal atrial fibrillation: Secondary | ICD-10-CM | POA: Diagnosis not present

## 2022-07-15 DIAGNOSIS — N183 Chronic kidney disease, stage 3 unspecified: Secondary | ICD-10-CM | POA: Diagnosis not present

## 2022-07-15 DIAGNOSIS — E1122 Type 2 diabetes mellitus with diabetic chronic kidney disease: Secondary | ICD-10-CM | POA: Insufficient documentation

## 2022-07-15 DIAGNOSIS — Z794 Long term (current) use of insulin: Secondary | ICD-10-CM | POA: Diagnosis not present

## 2022-07-15 DIAGNOSIS — E785 Hyperlipidemia, unspecified: Secondary | ICD-10-CM | POA: Diagnosis not present

## 2022-07-15 DIAGNOSIS — Z8673 Personal history of transient ischemic attack (TIA), and cerebral infarction without residual deficits: Secondary | ICD-10-CM | POA: Insufficient documentation

## 2022-07-15 DIAGNOSIS — I472 Ventricular tachycardia, unspecified: Secondary | ICD-10-CM | POA: Insufficient documentation

## 2022-07-15 DIAGNOSIS — I2510 Atherosclerotic heart disease of native coronary artery without angina pectoris: Secondary | ICD-10-CM | POA: Diagnosis not present

## 2022-07-15 DIAGNOSIS — Z79899 Other long term (current) drug therapy: Secondary | ICD-10-CM | POA: Insufficient documentation

## 2022-07-15 DIAGNOSIS — Z95810 Presence of automatic (implantable) cardiac defibrillator: Secondary | ICD-10-CM | POA: Insufficient documentation

## 2022-07-15 DIAGNOSIS — Z8616 Personal history of COVID-19: Secondary | ICD-10-CM | POA: Insufficient documentation

## 2022-07-15 LAB — CUP PACEART REMOTE DEVICE CHECK
Battery Remaining Longevity: 70 mo
Battery Remaining Percentage: 75 %
Battery Voltage: 3.01 V
Brady Statistic AP VP Percent: 8 %
Brady Statistic AP VS Percent: 75 %
Brady Statistic AS VP Percent: 2.1 %
Brady Statistic AS VS Percent: 13 %
Brady Statistic RA Percent Paced: 79 %
Brady Statistic RV Percent Paced: 12 %
Date Time Interrogation Session: 20240424051234
HighPow Impedance: 52 Ohm
HighPow Impedance: 52 Ohm
Implantable Lead Connection Status: 753985
Implantable Lead Connection Status: 753985
Implantable Lead Implant Date: 20070117
Implantable Lead Implant Date: 20070117
Implantable Lead Location: 753859
Implantable Lead Location: 753860
Implantable Lead Model: 7040
Implantable Pulse Generator Implant Date: 20220120
Lead Channel Impedance Value: 530 Ohm
Lead Channel Impedance Value: 530 Ohm
Lead Channel Pacing Threshold Amplitude: 0.75 V
Lead Channel Pacing Threshold Amplitude: 0.75 V
Lead Channel Pacing Threshold Pulse Width: 0.5 ms
Lead Channel Pacing Threshold Pulse Width: 0.5 ms
Lead Channel Sensing Intrinsic Amplitude: 11.8 mV
Lead Channel Sensing Intrinsic Amplitude: 4.3 mV
Lead Channel Setting Pacing Amplitude: 2.5 V
Lead Channel Setting Pacing Amplitude: 2.5 V
Lead Channel Setting Pacing Pulse Width: 0.5 ms
Lead Channel Setting Sensing Sensitivity: 0.5 mV
Pulse Gen Serial Number: 9976817
Zone Setting Status: 755011

## 2022-07-15 LAB — BASIC METABOLIC PANEL
Anion gap: 11 (ref 5–15)
BUN: 29 mg/dL — ABNORMAL HIGH (ref 8–23)
CO2: 24 mmol/L (ref 22–32)
Calcium: 9.1 mg/dL (ref 8.9–10.3)
Chloride: 107 mmol/L (ref 98–111)
Creatinine, Ser: 1.82 mg/dL — ABNORMAL HIGH (ref 0.44–1.00)
GFR, Estimated: 29 mL/min — ABNORMAL LOW (ref >60)
Glucose, Bld: 183 mg/dL — ABNORMAL HIGH (ref 70–99)
Potassium: 4.6 mmol/L (ref 3.5–5.1)
Sodium: 142 mmol/L (ref 135–145)

## 2022-07-15 MED ORDER — AMIODARONE HCL 200 MG PO TABS: 200.0000 mg | ORAL_TABLET | Freq: Two times a day (BID) | ORAL | 11 refills | Status: DC

## 2022-07-15 NOTE — Patient Instructions (Signed)
INCREASE Amiodarone to 200 mg twice a day  Labs today We will only contact you if something comes back abnormal or we need to make some changes. Otherwise no news is good news!  Please wear your compression hose daily, place them on as soon as you get up in the morning and remove before you go to bed at night.  Your physician wants you to follow-up in: 2 months with Dr Shirlee Latch and echo You will receive a reminder letter in the mail two months in advance. If you don't receive a letter, please call our office to schedule the follow-up appointment.  Your physician has requested that you have an echocardiogram. Echocardiography is a painless test that uses sound waves to create images of your heart. It provides your doctor with information about the size and shape of your heart and how well your heart's chambers and valves are working. This procedure takes approximately one hour. There are no restrictions for this procedure. Please do NOT wear cologne, perfume, aftershave, or lotions (deodorant is allowed). Please arrive 15 minutes prior to your appointment time.  Do the following things EVERYDAY: Weigh yourself in the morning before breakfast. Write it down and keep it in a log. Take your medicines as prescribed Eat low salt foods--Limit salt (sodium) to 2000 mg per day.  Stay as active as you can everyday Limit all fluids for the day to less than 2 liters  At the [facility], you and your health needs are our priority. As part of our continuing mission to provide you with exceptional heart care, we have created designated Provider Care Teams. These Care Teams include your primary Cardiologist (physician) and Advanced Practice Providers (APPs- Physician Assistants and Nurse Practitioners) who all work together to provide you with the care you need, when you need it.   You may see any of the following providers on your designated Care Team at your next follow up: Dr Arvilla Meres Dr [name] Dr. Marcos Eke, NP Robbie Lis, [place] [facility] [place], [place] Brynda Peon, NP Karle Plumber, PharmD   Please be sure to bring in all your medications bottles to every appointment.    Thank you for choosing [facility]

## 2022-07-16 ENCOUNTER — Ambulatory Visit: Payer: Medicare HMO | Admitting: Internal Medicine

## 2022-07-21 ENCOUNTER — Ambulatory Visit: Payer: Medicare HMO | Attending: Cardiology | Admitting: Cardiology

## 2022-07-21 ENCOUNTER — Encounter: Payer: Self-pay | Admitting: Cardiology

## 2022-07-21 VITALS — BP 118/78 | HR 60 | Ht 63.0 in | Wt 186.0 lb

## 2022-07-21 DIAGNOSIS — I480 Paroxysmal atrial fibrillation: Secondary | ICD-10-CM | POA: Diagnosis not present

## 2022-07-21 DIAGNOSIS — I472 Ventricular tachycardia, unspecified: Secondary | ICD-10-CM | POA: Diagnosis not present

## 2022-07-21 DIAGNOSIS — I5022 Chronic systolic (congestive) heart failure: Secondary | ICD-10-CM | POA: Diagnosis not present

## 2022-07-21 DIAGNOSIS — Z79899 Other long term (current) drug therapy: Secondary | ICD-10-CM | POA: Diagnosis not present

## 2022-07-21 DIAGNOSIS — D6869 Other thrombophilia: Secondary | ICD-10-CM

## 2022-07-21 NOTE — Progress Notes (Signed)
Electrophysiology Office Note   Date:  07/21/2022   ID:  [name], DOB 03-16-1946, MRN [id number]  PCP:  Pcp, No  Cardiologist:  [name] Primary Electrophysiologist:  [name] Benko [name], MD    Chief Complaint: CHF   History of Present Illness: [name] is a 77 y.o. female who is being seen today for the evaluation of CHF at the request of No ref. provider found. Presenting today for electrophysiology evaluation.  History seen for coronary artery disease, chronic systolic heart failure due to ischemic cardiomyopathy, atrial fibrillation.  She was hospitalized in November 2020 for with VT and ICD discharge.  Left heart catheterization showed patent stents.  She is on amiodarone.  She was again admitted to the hospital on March 2024 with VT storm, AKI, hyperkalemia.  She was cardioverted in the emergency room.  She was reloaded on amiodarone.  Today, she denies symptoms of palpitations, chest pain, shortness of breath, orthopnea, PND, lower extremity edema, claudication, dizziness, presyncope, syncope, bleeding, or neurologic sequela. The patient is tolerating medications without difficulties.  She has had no further arrhythmias.  She has felt well without complaint.   Past Medical History:  Diagnosis Date   Allergy    Arthritis    Atrial fibrillation (HCC)    controlled with amiodarone, on coumadin   Chronic renal insufficiency    Chronic systolic heart failure (HCC)    Congestive heart failure (HCC)    Coronary artery disease 01/31/2011   Diabetes mellitus    type 1   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    History of chicken pox    Hyperlipidemia    Hypertension    Ischemic cardiomyopathy    Lumbar spondylosis 01/11/2012   Sleep apnea    Stroke [facility]) 2010   eye doctor said she had TIA   Ventricular tachycardia (HCC)    Polymorphic   Past Surgical History:  Procedure Laterality Date   ABDOMINAL HYSTERECTOMY  1995   CARDIAC DEFIBRILLATOR  PLACEMENT     CHOLECYSTECTOMY  1985   COLONOSCOPY WITH PROPOFOL N/A 02/24/2018   Procedure: COLONOSCOPY WITH PROPOFOL;  Surgeon: Rachael Fee, MD;  Location: [facility];  Service: Endoscopy;  Laterality: N/A;   ICD  2003/2007   implanted by Dr Alanda Amass, most recent generator change 2/13 by Dr Johney Frame, Analyze ST study patient   ICD GENERATOR CHANGEOUT N/A 04/11/2020   Procedure: ICD GENERATOR CHANGEOUT;  Surgeon: Hillis Range, MD;  Location: [facility] INVASIVE CV LAB;  Service: Cardiovascular;  Laterality: N/A;   IMPLANTABLE CARDIOVERTER DEFIBRILLATOR (ICD) GENERATOR CHANGE N/A 05/05/2011   Procedure: ICD GENERATOR CHANGE;  Surgeon: Hillis Range, MD;  Location: [facility] CATH LAB;  Service: Cardiovascular;  Laterality: N/A;   LEFT HEART CATHETERIZATION WITH CORONARY ANGIOGRAM N/A 01/30/2011   Procedure: LEFT HEART CATHETERIZATION WITH CORONARY ANGIOGRAM;  Surgeon: Laurey Morale, MD;  [facility] LAB;  Service: Cardiovascular;  Laterality: N/A;   POLYPECTOMY  02/24/2018   Procedure: POLYPECTOMY;  Surgeon: Rachael Fee, MD;  Location: [facility];  Service: Endoscopy;;   TUBALIGATION  1980     Current Outpatient Medications  Medication Sig Dispense Refill   acetaminophen (TYLENOL) 325 MG tablet Take 2 tablets (650 mg total) by mouth every 4 (four) hours as needed for mild pain or moderate pain.     amiodarone (PACERONE) 200 MG tablet Take 1 tablet (200 mg total) by mouth 2 (two) times daily. 60 tablet 11   Blood Glucose Monitoring Suppl (ACCU-CHEK NANO SMARTVIEW)  W/DEVICE KIT Use to test blood sugar 4 times daily as instructed. Dx code: E11.59 1 kit 0   busPIRone (BUSPAR) 5 MG tablet Take 1 tablet (5 mg total) by mouth 2 (two) times daily. 60 tablet 1   carvedilol (COREG) 12.5 MG tablet TAKE 1 TABLET (12.5 MG TOTAL) BY MOUTH 2 (TWO) TIMES DAILY WITH A MEAL. 180 tablet 3   Cholecalciferol (VITAMIN D3) 2000 UNITS TABS Take 2,000 Units by mouth every morning.     dicyclomine (BENTYL) 10 MG  capsule TAKE 1 CAPSULE (10 MG TOTAL) BY MOUTH 4 (FOUR) TIMES DAILY BEFORE MEALS AND AT BEDTIME. 120 capsule 1   Dulaglutide (TRULICITY) 3 MG/0.5ML SOPN Inject 3 mg into the skin once a week. 2 mL 11   ELIQUIS 5 MG TABS tablet TAKE 1 TABLET BY MOUTH TWICE A DAY 60 tablet 11   empagliflozin (JARDIANCE) 10 MG TABS tablet Take 1 tablet (10 mg total) by mouth every morning. 90 tablet 3   fluticasone (FLONASE) 50 MCG/ACT nasal spray Place 1 spray into both nostrils daily as needed for allergies or rhinitis.     furosemide (LASIX) 20 MG tablet Take 2 tablets (40 mg total) by mouth daily. 60 tablet 8   gabapentin (NEURONTIN) 100 MG capsule Take 2 capsules (200 mg total) by mouth 2 (two) times daily. 360 capsule 3   insulin aspart (NOVOLOG FLEXPEN) 100 UNIT/ML FlexPen Inject up to 15 units daily under skin as advised 15 mL 11   insulin degludec (TRESIBA FLEXTOUCH) 100 UNIT/ML FlexTouch Pen Inject 12 Units into the skin daily. 9 mL 3   insulin detemir (LEVEMIR) 100 UNIT/ML injection Inject 12 Units into the skin at bedtime.     levETIRAcetam (KEPPRA) 250 MG tablet TAKE 1 TABLET BY MOUTH TWICE A DAY 180 tablet 1   losartan (COZAAR) 25 MG tablet Take 1 tablet (25 mg total) by mouth daily. 30 tablet 11   magnesium oxide (MAG-OX) 400 (240 Mg) MG tablet TAKE 1 TABLET BY MOUTH TWICE A DAY 180 tablet 1   Multiple Vitamins-Minerals (EYE VITAMINS PO) Take 1 tablet by mouth 2 (two) times daily.     nitroGLYCERIN (NITROSTAT) 0.4 MG SL tablet Place 1 tablet (0.4 mg total) under the tongue every 5 (five) minutes as needed for chest pain. 25 tablet 1   NOVOLOG 100 UNIT/ML injection INJECT 10-16 UNITS INTO THE SKIN 3 (THREE) TIMES DAILY WITH MEALS. 10 mL 20   omeprazole (PRILOSEC OTC) 20 MG tablet Take 20 mg by mouth every morning.     potassium chloride SA (KLOR-CON M) 20 MEQ tablet Take 1 tablet (20 mEq total) by mouth daily. 30 tablet 8   rosuvastatin (CRESTOR) 40 MG tablet TAKE 1 TABLET BY MOUTH EVERY DAY 90 tablet 3    Semaglutide,0.25 or 0.5MG /DOS, (OZEMPIC, 0.25 OR 0.5 MG/DOSE,) 2 MG/3ML SOPN Inject 0.5 mg into the skin once a week. 3 mL 2   sertraline (ZOLOFT) 25 MG tablet Take 50 mg by mouth at bedtime.     No current facility-administered medications for this visit.    Allergies:   Veltassa [patiromer], Darvon, Promethazine hcl, Spironolactone, and Plavix [clopidogrel bisulfate]   Social History:  The patient  reports that she quit smoking about 26 years ago. Her smoking use included cigarettes. She has never used smokeless tobacco. She reports that she does not drink alcohol and does not use drugs.   Family History:  The patient's family history includes Breast cancer in her sister; Colon cancer in  her maternal aunt; Diabetes in her mother; Early death (age of onset: 42) in her brother; Heart attack in her father; Heart disease in her father and mother; Hyperlipidemia in her mother; Hypertension in her father and mother; Irritable bowel syndrome in her sister; Lung cancer in her sister; Seizures in her granddaughter.    ROS:  Please see the history of present illness.   Otherwise, review of systems is positive for none.   All other systems are reviewed and negative.    PHYSICAL EXAM: VS:  BP 118/78   Pulse 60   Ht 5\' 3"  (1.6 m)   Wt 186 lb (84.4 kg)   SpO2 96%   BMI 32.95 kg/m  , BMI Body mass index is 32.95 kg/m. GEN: Well nourished, well developed, in no acute distress  HEENT: normal  Neck: no JVD, carotid bruits, or masses Cardiac: RRR; no murmurs, rubs, or gallops,no edema  Respiratory:  clear to auscultation bilaterally, normal work of breathing GI: soft, nontender, nondistended, + BS MS: no deformity or atrophy  Skin: warm and dry, device pocket is well healed Neuro:  Strength and sensation are intact Psych: euthymic mood, full affect  EKG:  EKG is not ordered today. Personal review of the ekg ordered 06/22/22 shows AV paced  Device interrogation is reviewed today in detail.  See  PaceArt for details.   Recent Labs: 06/11/2022: Hemoglobin 11.6; Platelets 190; TSH 3.640 06/12/2022: ALT 12 06/22/2022: B Natriuretic Peptide 1,422.7; Magnesium 2.1 07/15/2022: BUN 29; Creatinine, Ser 1.82; Potassium 4.6; Sodium 142    Lipid Panel     Component Value Date/Time   CHOL 125 12/18/2021 1038   TRIG 44 12/18/2021 1038   HDL 50 12/18/2021 1038   CHOLHDL 2.5 12/18/2021 1038   VLDL 9 12/18/2021 1038   LDLCALC 66 12/18/2021 1038     Wt Readings from Last 3 Encounters:  07/21/22 186 lb (84.4 kg)  07/15/22 187 lb 6.4 oz (85 kg)  06/23/22 187 lb 9.6 oz (85.1 kg)      Other studies Reviewed: Additional studies/ records that were reviewed today include: TTE 06/15/22  Review of the above records today demonstrates:   1. Left ventricular ejection fraction, by estimation, is 25 to 30%. The  left ventricle has severely decreased function. The left ventricle  demonstrates global hypokinesis. Left ventricular diastolic parameters are  consistent with Grade I diastolic  dysfunction (impaired relaxation).   2. Right ventricular systolic function is normal. The right ventricular  size is normal. There is normal pulmonary artery systolic pressure.   3. Left atrial size was mildly dilated.   4. The mitral valve is normal in structure. Moderate mitral valve  regurgitation.   5. The aortic valve is grossly normal. Aortic valve regurgitation is  moderate to severe.   6. The inferior vena cava is dilated in size with >50% respiratory  variability, suggesting right atrial pressure of 8 mmHg.    ASSESSMENT AND PLAN:  1.  Paroxysmal atrial fibrillation: Currently on amiodarone and Eliquis.  CHA2DS2-VASc of at least 5.  2.  Chronic systolic heart failure: Due to ischemic cardiomyopathy.  Ejection fraction 25 to 30%.  Currently on optimal medical therapy for heart failure cardiology.  Status post [facility] Jude ICD.  Sensing, threshold, impedance within normal limits.  3.  Ventricular  tachycardia: Recent admission for VT storm.  Was reloaded with amiodarone.  No further events.  Amayrany Cafaro continue twice daily amiodarone for 1 more month and then reduce to once a day.  4.  Coronary artery disease: Nonobstructive disease on last catheterization.  5.  High risk medication monitoring: Currently on amiodarone for atrial fibrillation and ventricular tachycardia.  Recent TSH and LFTs within normal limits.  6.  Secondary hypercoagulable state: Currently on Eliquis for atrial fibrillation  Current medicines are reviewed at length with the patient today.   The patient does not have concerns regarding her medicines.  The following changes were made today:  none  Labs/ tests ordered today include:  No orders of the defined types were placed in this encounter.    Disposition:   FU 3 months  Signed, Shaleta Ruacho [name], MD  07/21/2022 12:26 PM     [facility] [address] [place] [address] [phone] (office) [phone] (fax)

## 2022-07-21 NOTE — Patient Instructions (Signed)
Medication Instructions:  Your physician has recommended you make the following change in your medication:  DECREASE Amiodarone to 200 mg ONCE daily in 1 month  *If you need a refill on your cardiac medications before your next appointment, please call your pharmacy*   Lab Work: None ordered   Testing/Procedures: None ordered   Follow-Up: At [facility], you and your health needs are our priority.  As part of our continuing mission to provide you with exceptional heart care, we have created designated Provider Care Teams.  These Care Teams include your primary Cardiologist (physician) and Advanced Practice Providers (APPs -  Physician Assistants and Nurse Practitioners) who all work together to provide you with the care you need, when you need it.   Remote monitoring is used to monitor your Pacemaker of ICD from home. This monitoring reduces the number of office visits required to check your device to one time per year. It allows [name] to keep an eye on the functioning of your device to ensure it is working properly. You are scheduled for a device check from home on 10/14/2022. You may send your transmission at any time that day. If you have a wireless device, the transmission will be sent automatically. After your physician reviews your transmission, you will receive a postcard with your next transmission date.  Your next appointment:   3 month(s)  The format for your next appointment:   In Person  Provider:   Casimiro Needle "Mardelle Matte" Lanna Poche, PA-C    Thank you for choosing [facility]!!   Dory Horn, RN [phone]

## 2022-08-10 ENCOUNTER — Telehealth: Payer: Self-pay

## 2022-08-10 MED ORDER — MEXILETINE HCL 250 MG PO CAPS: 250.0000 mg | ORAL_CAPSULE | Freq: Two times a day (BID) | ORAL | 11 refills | Status: DC

## 2022-08-10 NOTE — Telephone Encounter (Signed)
Following alert received from [employer] received for VT event on 5/19 @ 12:03, EGM shows sustained VT, rate 157, 22sec in duration with self termination to regular AP/VP.  Per last OV note:" Ventricular tachycardia: Recent admission for VT storm.  Was reloaded with amiodarone.  No further events.  Will continue twice daily amiodarone for 1 more month and then reduce to once a day. "   Routing to Dr. Elberta Fortis for review.

## 2022-08-10 NOTE — Telephone Encounter (Signed)
Outreach made to Pt.  She was unaware of any fast heart rates on Aug 09, 2022.  Advised that Dr. Elberta Fortis would still like Pt to reduce amiodarone on 08/20/2022 to 200 mg PO daily.  But in addition, would like to add mexiletine 250 mg PO BID to help control VT.  Pt indicates understanding.  Advised 30 day supply sent to Pt's pharmacy.    Advised to monitor for side effects and reach out if any issues.

## 2022-08-11 NOTE — Progress Notes (Signed)
Remote ICD transmission.   

## 2022-08-21 ENCOUNTER — Other Ambulatory Visit (HOSPITAL_COMMUNITY): Payer: Self-pay | Admitting: Family Medicine

## 2022-08-21 DIAGNOSIS — I5022 Chronic systolic (congestive) heart failure: Secondary | ICD-10-CM

## 2022-08-24 ENCOUNTER — Telehealth: Payer: Self-pay | Admitting: Cardiology

## 2022-08-24 MED ORDER — CARVEDILOL 12.5 MG PO TABS: 25.0000 mg | ORAL_TABLET | Freq: Two times a day (BID) | ORAL | 3 refills | Status: DC

## 2022-08-24 NOTE — Telephone Encounter (Signed)
Spoke with patient and sent in prescriptions. Patient verbalized understanding and had no questions.

## 2022-08-24 NOTE — Telephone Encounter (Signed)
Pt c/o medication issue:  1. Name of Medication:   mexiletine (MEXITIL) 250 MG capsule    2. How are you currently taking this medication (dosage and times per day)?  Not taking anymore, was taking it as written   3. Are you having a reaction (difficulty breathing--STAT)? no  4. What is your medication issue? Pt states when she started this med her vision was blurry, upset stomach, vomiting, headache, nausea, and she couldn't walk that well. Pt states she stopped taking this med and she feels better now. She wanted to let Dr. Elberta Fortis know to see if he wants to start her on something else.

## 2022-08-24 NOTE — Telephone Encounter (Signed)
Returned call to patient. She states she started mexiletine week of 5/20 and she took it for 3-4 days. On the last day she developed blurry vision, headache, nausea, vomiting, hand tremors and "couldn't walk well." She states she "just didn't feel right."  Patient states she stopped the mexiletine about a week ago and all symptoms have resolved.  Mexiletine removed from medication list and placed on allergy list as intolerance.  She would like to know what other medication Dr. Elberta Fortis would recommend instead of mexiletine.  Patient also states she is currently taking Amiodarone 200mg  QD.  Will forward to Dr. Elberta Fortis to review.  Patient also states when she was going to bed last night she felt weak and dizzy, she reports an episode of dizziness today as well. She asked if her device showed anything in correlation to this. Will forward to device clinic to interrogate device.

## 2022-08-24 NOTE — Telephone Encounter (Signed)
Several episodes of AT and AFL. One episode of VT on 5/26 at 2044 last 90 seconds.    Forwarded to [facility]       Fastest longest

## 2022-09-16 ENCOUNTER — Other Ambulatory Visit: Payer: Self-pay | Admitting: Neurology

## 2022-09-17 ENCOUNTER — Other Ambulatory Visit: Payer: Self-pay | Admitting: Internal Medicine

## 2022-09-17 ENCOUNTER — Other Ambulatory Visit (HOSPITAL_COMMUNITY): Payer: Self-pay | Admitting: Cardiology

## 2022-09-22 ENCOUNTER — Telehealth: Payer: Self-pay

## 2022-09-22 NOTE — Telephone Encounter (Signed)
Following alert received from [employer] received for VT in the monitor zone Event occurred 7/1 @ 21:25, duration 4sec, HR 150.  Spontaneous return to AP/VP.  Patient reports she was laying down during this time going to sleep. Reports of palpitations during VT event. Denies chest pain, shortness of breath, dizziness of syncope. No recent illness. Patient reports confusion with Amiodarone medication dose as one pill bottle has 100 mg and another 200 mg. States she is currently taking Amiodarone 200 mg BID. Routing to Dr. Elberta Fortis to clarify correct dose for patient and VT strip.    Per Dr. Elberta Fortis last OV note  07/21/2022 APT:

## 2022-09-22 NOTE — Telephone Encounter (Signed)
Patient called back because she states the call was disconnected. CMA advised that RN is not available at this moment. Please return call when able.

## 2022-09-23 MED ORDER — RANOLAZINE ER 500 MG PO TB12: 500.0000 mg | ORAL_TABLET | Freq: Two times a day (BID) | ORAL | 0 refills | Status: DC

## 2022-09-23 NOTE — Telephone Encounter (Signed)
Called and confirmed Amiodarone dose, patient is is taking 200mg  of Amiodarone once daily, 25mg  of Coreg BID, patient unable to tolerate mexiletine

## 2022-09-23 NOTE — Telephone Encounter (Signed)
Spoke with patient, informed her that Dr. Elberta Fortis recommend she try Ranexa 500mg  BID for VT, pt voiced understanding

## 2022-09-23 NOTE — Telephone Encounter (Signed)
Patient states she needs to update the medication Amiodarone to 200 mg 1 x daily. She states she was confused by the paper she had been reading, but wanted to clarify.

## 2022-09-23 NOTE — Addendum Note (Signed)
Addended by: Dorathy Daft on: 09/23/2022 04:02 PM   Modules accepted: Orders

## 2022-09-28 ENCOUNTER — Telehealth: Payer: Self-pay | Admitting: Cardiology

## 2022-09-28 NOTE — Telephone Encounter (Signed)
Ranolazine is known to be QTc prolonging. Risk increased when combined with amiodarone. Baseline QTc looks around 490. Amiodarone can also increase concentration of Ranolazine. Will send to Dr. Nelly Laurence who is covering for Dr. Elberta Fortis for final thoughts.

## 2022-09-28 NOTE — Telephone Encounter (Signed)
Pt c/o medication issue:  1. Name of Medication:   amiodarone (PACERONE) 100 MG tablet  amiodarone (PACERONE) 200 MG tablet  ranolazine (RANEXA) 500 MG 12 hr tablet  2. How are you currently taking this medication (dosage and times per day)?   3. Are you having a reaction (difficulty breathing--STAT)? No  4. What is your medication issue? Pt states that the pharmacy make her aware that the above medications should not be taken together. Pt would like a callback regarding this matter. Pleas advise

## 2022-09-29 NOTE — Telephone Encounter (Signed)
Dr. Nelly Laurence,  I want to clarify that you want patient to start Ranexa 500mg  BID for VT.   Thanks, Marliss Czar

## 2022-09-29 NOTE — Telephone Encounter (Signed)
I discussed with Dr. Nelly Laurence and clarified that it is ok for patient to being. I called and spoke with patient. Relayed info that we are aware of interaction. Ok to use. We will monitor. Patient appreciative of the call.

## 2022-10-02 ENCOUNTER — Encounter (HOSPITAL_COMMUNITY): Payer: Self-pay

## 2022-10-02 ENCOUNTER — Other Ambulatory Visit: Payer: Self-pay

## 2022-10-02 ENCOUNTER — Telehealth: Payer: Self-pay

## 2022-10-02 ENCOUNTER — Inpatient Hospital Stay (HOSPITAL_COMMUNITY)
Admission: EM | Admit: 2022-10-02 | Discharge: 2022-10-06 | DRG: 309 | Disposition: A | Discharge status: Home / Self Care | Payer: Medicare HMO | Attending: Internal Medicine | Admitting: Internal Medicine

## 2022-10-02 DIAGNOSIS — R197 Diarrhea, unspecified: Secondary | ICD-10-CM | POA: Diagnosis present

## 2022-10-02 DIAGNOSIS — E860 Dehydration: Secondary | ICD-10-CM | POA: Diagnosis present

## 2022-10-02 DIAGNOSIS — Z95810 Presence of automatic (implantable) cardiac defibrillator: Secondary | ICD-10-CM

## 2022-10-02 DIAGNOSIS — Z820 Family history of epilepsy and other diseases of the nervous system: Secondary | ICD-10-CM

## 2022-10-02 DIAGNOSIS — G4733 Obstructive sleep apnea (adult) (pediatric): Secondary | ICD-10-CM | POA: Diagnosis present

## 2022-10-02 DIAGNOSIS — N179 Acute kidney failure, unspecified: Secondary | ICD-10-CM | POA: Diagnosis present

## 2022-10-02 DIAGNOSIS — I472 Ventricular tachycardia, unspecified: Secondary | ICD-10-CM | POA: Diagnosis not present

## 2022-10-02 DIAGNOSIS — I480 Paroxysmal atrial fibrillation: Secondary | ICD-10-CM | POA: Diagnosis present

## 2022-10-02 DIAGNOSIS — I130 Hypertensive heart and chronic kidney disease with heart failure and stage 1 through stage 4 chronic kidney disease, or unspecified chronic kidney disease: Secondary | ICD-10-CM | POA: Diagnosis present

## 2022-10-02 DIAGNOSIS — I10 Essential (primary) hypertension: Secondary | ICD-10-CM | POA: Diagnosis present

## 2022-10-02 DIAGNOSIS — Z83438 Family history of other disorder of lipoprotein metabolism and other lipidemia: Secondary | ICD-10-CM

## 2022-10-02 DIAGNOSIS — E039 Hypothyroidism, unspecified: Secondary | ICD-10-CM | POA: Diagnosis present

## 2022-10-02 DIAGNOSIS — Z6833 Body mass index (BMI) 33.0-33.9, adult: Secondary | ICD-10-CM

## 2022-10-02 DIAGNOSIS — I2510 Atherosclerotic heart disease of native coronary artery without angina pectoris: Secondary | ICD-10-CM | POA: Diagnosis present

## 2022-10-02 DIAGNOSIS — E1143 Type 2 diabetes mellitus with diabetic autonomic (poly)neuropathy: Secondary | ICD-10-CM | POA: Diagnosis present

## 2022-10-02 DIAGNOSIS — I255 Ischemic cardiomyopathy: Secondary | ICD-10-CM | POA: Diagnosis present

## 2022-10-02 DIAGNOSIS — K3184 Gastroparesis: Secondary | ICD-10-CM | POA: Diagnosis present

## 2022-10-02 DIAGNOSIS — Z7901 Long term (current) use of anticoagulants: Secondary | ICD-10-CM

## 2022-10-02 DIAGNOSIS — D6869 Other thrombophilia: Secondary | ICD-10-CM | POA: Diagnosis present

## 2022-10-02 DIAGNOSIS — F419 Anxiety disorder, unspecified: Secondary | ICD-10-CM | POA: Diagnosis present

## 2022-10-02 DIAGNOSIS — E1121 Type 2 diabetes mellitus with diabetic nephropathy: Secondary | ICD-10-CM

## 2022-10-02 DIAGNOSIS — G8929 Other chronic pain: Secondary | ICD-10-CM | POA: Diagnosis present

## 2022-10-02 DIAGNOSIS — Z8673 Personal history of transient ischemic attack (TIA), and cerebral infarction without residual deficits: Secondary | ICD-10-CM

## 2022-10-02 DIAGNOSIS — I5022 Chronic systolic (congestive) heart failure: Secondary | ICD-10-CM | POA: Diagnosis present

## 2022-10-02 DIAGNOSIS — E861 Hypovolemia: Secondary | ICD-10-CM | POA: Diagnosis present

## 2022-10-02 DIAGNOSIS — Z79899 Other long term (current) drug therapy: Secondary | ICD-10-CM | POA: Diagnosis not present

## 2022-10-02 DIAGNOSIS — G40909 Epilepsy, unspecified, not intractable, without status epilepticus: Secondary | ICD-10-CM

## 2022-10-02 DIAGNOSIS — E785 Hyperlipidemia, unspecified: Secondary | ICD-10-CM | POA: Diagnosis present

## 2022-10-02 DIAGNOSIS — E1165 Type 2 diabetes mellitus with hyperglycemia: Secondary | ICD-10-CM | POA: Diagnosis present

## 2022-10-02 DIAGNOSIS — Z800 Family history of malignant neoplasm of digestive organs: Secondary | ICD-10-CM

## 2022-10-02 DIAGNOSIS — Z8249 Family history of ischemic heart disease and other diseases of the circulatory system: Secondary | ICD-10-CM

## 2022-10-02 DIAGNOSIS — N1832 Chronic kidney disease, stage 3b: Secondary | ICD-10-CM | POA: Diagnosis present

## 2022-10-02 DIAGNOSIS — Z87891 Personal history of nicotine dependence: Secondary | ICD-10-CM

## 2022-10-02 DIAGNOSIS — E669 Obesity, unspecified: Secondary | ICD-10-CM | POA: Diagnosis present

## 2022-10-02 DIAGNOSIS — N189 Chronic kidney disease, unspecified: Secondary | ICD-10-CM | POA: Diagnosis present

## 2022-10-02 DIAGNOSIS — Z794 Long term (current) use of insulin: Secondary | ICD-10-CM | POA: Diagnosis not present

## 2022-10-02 DIAGNOSIS — E1122 Type 2 diabetes mellitus with diabetic chronic kidney disease: Secondary | ICD-10-CM | POA: Diagnosis present

## 2022-10-02 DIAGNOSIS — Z7984 Long term (current) use of oral hypoglycemic drugs: Secondary | ICD-10-CM

## 2022-10-02 DIAGNOSIS — Z803 Family history of malignant neoplasm of breast: Secondary | ICD-10-CM

## 2022-10-02 DIAGNOSIS — Z955 Presence of coronary angioplasty implant and graft: Secondary | ICD-10-CM

## 2022-10-02 DIAGNOSIS — Z801 Family history of malignant neoplasm of trachea, bronchus and lung: Secondary | ICD-10-CM

## 2022-10-02 DIAGNOSIS — Z833 Family history of diabetes mellitus: Secondary | ICD-10-CM

## 2022-10-02 DIAGNOSIS — E1129 Type 2 diabetes mellitus with other diabetic kidney complication: Secondary | ICD-10-CM | POA: Diagnosis present

## 2022-10-02 DIAGNOSIS — Z7985 Long-term (current) use of injectable non-insulin antidiabetic drugs: Secondary | ICD-10-CM

## 2022-10-02 HISTORY — DX: Chronic kidney disease, unspecified: N17.9

## 2022-10-02 LAB — TSH: TSH: 2.833 u[IU]/mL (ref 0.350–4.500)

## 2022-10-02 LAB — CBC
HCT: 40.6 % (ref 36.0–46.0)
Hemoglobin: 12.4 g/dL (ref 12.0–15.0)
MCH: 29.5 pg (ref 26.0–34.0)
MCHC: 30.5 g/dL (ref 30.0–36.0)
MCV: 96.7 fL (ref 80.0–100.0)
Platelets: 215 10*3/uL (ref 150–400)
RBC: 4.2 MIL/uL (ref 3.87–5.11)
RDW: 16.3 % — ABNORMAL HIGH (ref 11.5–15.5)
WBC: 8 10*3/uL (ref 4.0–10.5)
nRBC: 0 % (ref 0.0–0.2)

## 2022-10-02 LAB — COMPREHENSIVE METABOLIC PANEL
ALT: 45 U/L — ABNORMAL HIGH (ref 0–44)
AST: 36 U/L (ref 15–41)
Albumin: 3.8 g/dL (ref 3.5–5.0)
Alkaline Phosphatase: 85 U/L (ref 38–126)
Anion gap: 18 — ABNORMAL HIGH (ref 5–15)
BUN: 36 mg/dL — ABNORMAL HIGH (ref 8–23)
CO2: 21 mmol/L — ABNORMAL LOW (ref 22–32)
Calcium: 9.2 mg/dL (ref 8.9–10.3)
Chloride: 102 mmol/L (ref 98–111)
Creatinine, Ser: 2.41 mg/dL — ABNORMAL HIGH (ref 0.44–1.00)
GFR, Estimated: 20 mL/min — ABNORMAL LOW (ref >60)
Glucose, Bld: 199 mg/dL — ABNORMAL HIGH (ref 70–99)
Potassium: 4.6 mmol/L (ref 3.5–5.1)
Sodium: 141 mmol/L (ref 135–145)
Total Bilirubin: 0.3 mg/dL (ref 0.3–1.2)
Total Protein: 7.2 g/dL (ref 6.5–8.1)

## 2022-10-02 LAB — TROPONIN I (HIGH SENSITIVITY)
Troponin I (High Sensitivity): 45 ng/L — ABNORMAL HIGH (ref <18)
Troponin I (High Sensitivity): 46 ng/L — ABNORMAL HIGH (ref <18)

## 2022-10-02 LAB — T4, FREE: Free T4: 1.19 ng/dL — ABNORMAL HIGH (ref 0.61–1.12)

## 2022-10-02 LAB — GLUCOSE, CAPILLARY
Glucose-Capillary: 171 mg/dL — ABNORMAL HIGH (ref 70–99)
Glucose-Capillary: 187 mg/dL — ABNORMAL HIGH (ref 70–99)

## 2022-10-02 LAB — HEMOGLOBIN A1C
Hgb A1c MFr Bld: 9.2 % — ABNORMAL HIGH (ref 4.8–5.6)
Mean Plasma Glucose: 217.34 mg/dL

## 2022-10-02 LAB — MAGNESIUM: Magnesium: 2.5 mg/dL — ABNORMAL HIGH (ref 1.7–2.4)

## 2022-10-02 MED ORDER — APIXABAN 5 MG PO TABS
5.0000 mg | ORAL_TABLET | Freq: Two times a day (BID) | ORAL | Status: DC
  Administered 2022-10-02 – 2022-10-06 (×8): 5 mg via ORAL
  Filled 2022-10-02 (×8): qty 1

## 2022-10-02 MED ORDER — DICYCLOMINE HCL 10 MG PO CAPS
10.0000 mg | ORAL_CAPSULE | Freq: Three times a day (TID) | ORAL | Status: DC
  Administered 2022-10-02: 10 mg via ORAL
  Filled 2022-10-02: qty 1

## 2022-10-02 MED ORDER — INSULIN DEGLUDEC 100 UNIT/ML ~~LOC~~ SOPN
12.0000 [IU] | PEN_INJECTOR | Freq: Every day | SUBCUTANEOUS | Status: DC
  Filled 2022-10-02: qty 3

## 2022-10-02 MED ORDER — INSULIN GLARGINE-YFGN 100 UNIT/ML ~~LOC~~ SOLN
12.0000 [IU] | Freq: Every day | SUBCUTANEOUS | Status: DC
  Administered 2022-10-02 – 2022-10-05 (×4): 12 [IU] via SUBCUTANEOUS
  Filled 2022-10-02 (×5): qty 0.12

## 2022-10-02 MED ORDER — AMIODARONE HCL IN DEXTROSE 360-4.14 MG/200ML-% IV SOLN
60.0000 mg/h | INTRAVENOUS | Status: AC
  Administered 2022-10-02 (×2): 60 mg/h via INTRAVENOUS
  Filled 2022-10-02 (×2): qty 200

## 2022-10-02 MED ORDER — ONDANSETRON HCL 4 MG/2ML IJ SOLN: 4.0000 mg | Freq: Four times a day (QID) | INTRAMUSCULAR | Status: DC | PRN

## 2022-10-02 MED ORDER — LACTATED RINGERS IV SOLN: INTRAVENOUS | Status: DC

## 2022-10-02 MED ORDER — BUSPIRONE HCL 10 MG PO TABS
5.0000 mg | ORAL_TABLET | Freq: Two times a day (BID) | ORAL | Status: DC
  Administered 2022-10-02 – 2022-10-06 (×8): 5 mg via ORAL
  Filled 2022-10-02: qty 1
  Filled 2022-10-02: qty 0.5
  Filled 2022-10-02: qty 1
  Filled 2022-10-02: qty 0.5
  Filled 2022-10-02: qty 1
  Filled 2022-10-02 (×4): qty 0.5
  Filled 2022-10-02 (×2): qty 1
  Filled 2022-10-02: qty 0.5
  Filled 2022-10-02: qty 1
  Filled 2022-10-02 (×2): qty 0.5
  Filled 2022-10-02 (×2): qty 1

## 2022-10-02 MED ORDER — BISACODYL 5 MG PO TBEC: 5.0000 mg | DELAYED_RELEASE_TABLET | Freq: Every day | ORAL | Status: DC | PRN

## 2022-10-02 MED ORDER — ONDANSETRON HCL 4 MG PO TABS: 4.0000 mg | ORAL_TABLET | Freq: Four times a day (QID) | ORAL | Status: DC | PRN

## 2022-10-02 MED ORDER — AMIODARONE HCL IN DEXTROSE 360-4.14 MG/200ML-% IV SOLN
30.0000 mg/h | INTRAVENOUS | Status: DC
  Administered 2022-10-02 – 2022-10-04 (×4): 30 mg/h via INTRAVENOUS
  Filled 2022-10-02 (×3): qty 200

## 2022-10-02 MED ORDER — INSULIN ASPART 100 UNIT/ML IJ SOLN
0.0000 [IU] | Freq: Three times a day (TID) | INTRAMUSCULAR | Status: DC
  Administered 2022-10-02 – 2022-10-03 (×2): 2 [IU] via SUBCUTANEOUS
  Administered 2022-10-03: 3 [IU] via SUBCUTANEOUS
  Administered 2022-10-03 – 2022-10-04 (×2): 2 [IU] via SUBCUTANEOUS
  Administered 2022-10-04: 3 [IU] via SUBCUTANEOUS
  Administered 2022-10-04: 2 [IU] via SUBCUTANEOUS
  Administered 2022-10-05: 1 [IU] via SUBCUTANEOUS
  Administered 2022-10-05 – 2022-10-06 (×2): 3 [IU] via SUBCUTANEOUS

## 2022-10-02 MED ORDER — CARVEDILOL 12.5 MG PO TABS
12.5000 mg | ORAL_TABLET | Freq: Two times a day (BID) | ORAL | Status: DC
  Administered 2022-10-03 – 2022-10-06 (×6): 12.5 mg via ORAL
  Filled 2022-10-02 (×6): qty 1

## 2022-10-02 MED ORDER — ACETAMINOPHEN 650 MG RE SUPP: 650.0000 mg | Freq: Four times a day (QID) | RECTAL | Status: DC | PRN

## 2022-10-02 MED ORDER — LACTATED RINGERS IV BOLUS: 1000.0000 mL | Freq: Once | INTRAVENOUS | Status: DC

## 2022-10-02 MED ORDER — POLYETHYLENE GLYCOL 3350 17 G PO PACK: 17.0000 g | PACK | Freq: Every day | ORAL | Status: DC | PRN

## 2022-10-02 MED ORDER — ACETAMINOPHEN 325 MG PO TABS: 650.0000 mg | ORAL_TABLET | Freq: Four times a day (QID) | ORAL | Status: DC | PRN

## 2022-10-02 MED ORDER — PANTOPRAZOLE SODIUM 40 MG PO TBEC
40.0000 mg | DELAYED_RELEASE_TABLET | Freq: Every morning | ORAL | Status: DC
  Administered 2022-10-03 – 2022-10-06 (×4): 40 mg via ORAL
  Filled 2022-10-02 (×4): qty 1

## 2022-10-02 MED ORDER — LEVETIRACETAM 250 MG PO TABS
250.0000 mg | ORAL_TABLET | Freq: Two times a day (BID) | ORAL | Status: DC
  Administered 2022-10-02 – 2022-10-06 (×8): 250 mg via ORAL
  Filled 2022-10-02 (×9): qty 1

## 2022-10-02 MED ORDER — LACTATED RINGERS IV BOLUS
500.0000 mL | Freq: Once | INTRAVENOUS | Status: AC
  Administered 2022-10-02: 500 mL via INTRAVENOUS

## 2022-10-02 MED ORDER — AMIODARONE LOAD VIA INFUSION
150.0000 mg | Freq: Once | INTRAVENOUS | Status: AC
  Administered 2022-10-02: 150 mg via INTRAVENOUS
  Filled 2022-10-02: qty 83.34

## 2022-10-02 MED ORDER — HYDRALAZINE HCL 20 MG/ML IJ SOLN: 5.0000 mg | INTRAMUSCULAR | Status: DC | PRN

## 2022-10-02 MED ORDER — ROSUVASTATIN CALCIUM 20 MG PO TABS
40.0000 mg | ORAL_TABLET | Freq: Every day | ORAL | Status: DC
  Administered 2022-10-03 – 2022-10-06 (×4): 40 mg via ORAL
  Filled 2022-10-02 (×4): qty 2

## 2022-10-02 MED ORDER — PANTOPRAZOLE SODIUM 20 MG PO TBEC: 20.0000 mg | DELAYED_RELEASE_TABLET | Freq: Every morning | ORAL | Status: DC

## 2022-10-02 MED ORDER — GABAPENTIN 100 MG PO CAPS
200.0000 mg | ORAL_CAPSULE | Freq: Two times a day (BID) | ORAL | Status: DC
  Administered 2022-10-02 – 2022-10-06 (×8): 200 mg via ORAL
  Filled 2022-10-02 (×8): qty 2

## 2022-10-02 MED ORDER — SERTRALINE HCL 50 MG PO TABS
50.0000 mg | ORAL_TABLET | Freq: Every day | ORAL | Status: DC
  Administered 2022-10-02 – 2022-10-05 (×4): 50 mg via ORAL
  Filled 2022-10-02 (×4): qty 1

## 2022-10-02 MED ORDER — SODIUM CHLORIDE 0.9% FLUSH
3.0000 mL | Freq: Two times a day (BID) | INTRAVENOUS | Status: DC
  Administered 2022-10-03 – 2022-10-06 (×7): 3 mL via INTRAVENOUS

## 2022-10-02 NOTE — ED Notes (Signed)
ED TO INPATIENT HANDOFF REPORT  ED Nurse Name and Phone #: Beatris Ship RN [phone]  S Name/Age/Gender [name] 77 y.o. female Room/Bed: 027C/027C  Code Status   Code Status: Full Code  Home/SNF/Other Home Patient oriented to: self, place, time, and situation Is this baseline? Yes   Triage Complete: Triage complete  Chief Complaint Ventricular tachycardia (HCC) [I47.20]  Triage Note Pt reports starting a new cardiac medication a few days ago and started having uncontrollable diarrhea. Pt had one episode of feeling dizzy and nauseated. Pt called cardiology yesterday and she was told to come here today due to having a run of Vtach, ICD in place, pt denies feeling any shocks. Pt a.o, denies chest pain or SOB   Allergies Allergies  Allergen Reactions   Veltassa [Patiromer] Diarrhea   Darvon Other (See Comments)    indigestion   Mexiletine Hcl     N/V, tremors, difficulty walking, blurred vision   Promethazine Hcl Other (See Comments)    hyperactivity   Ranexa [Ranolazine Er] Diarrhea   Spironolactone Diarrhea and Nausea And Vomiting   Plavix [Clopidogrel Bisulfate] Rash    Level of Care/Admitting Diagnosis ED Disposition     ED Disposition  Admit   Condition  --   Comment  Hospital [facility] [[id number]]  Level of Care: Progressive [102]  Admit to Progressive based on following criteria: CARDIOVASCULAR & THORACIC of moderate stability with acute coronary syndrome symptoms/low risk myocardial infarction/hypertensive urgency/arrhythmias/heart failure potentially compromising stability and stable post cardiovascular intervention patients.  May admit patient to [facility] or [facility] if equivalent level of care is available:: No  Covid Evaluation: Asymptomatic - no recent exposure (last 10 days) testing not required  Diagnosis: Ventricular tachycardia [facility]) [[id number]]  Admitting Physician: Jonah Blue [[id number]]  Attending Physician: Jonah Blue [[id number]]  Certification:: I certify this patient will need inpatient services for at least 2 midnights  Estimated Length of Stay: 3          B Medical/Surgery History Past Medical History:  Diagnosis Date   Allergy    Arthritis    Atrial fibrillation (HCC)    controlled with amiodarone, on coumadin   Chronic renal insufficiency    Chronic systolic heart failure (HCC)    Coronary artery disease 01/31/2011   Diabetes mellitus    type 1   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    History of chicken pox    Hyperlipidemia    Hypertension    Ischemic cardiomyopathy    Lumbar spondylosis 01/11/2012   Sleep apnea    Stroke [facility]) 2010   eye doctor said she had TIA   Ventricular tachycardia (HCC)    Polymorphic   Past Surgical History:  Procedure Laterality Date   ABDOMINAL HYSTERECTOMY  1995   CARDIAC DEFIBRILLATOR PLACEMENT     CHOLECYSTECTOMY  1985   COLONOSCOPY WITH PROPOFOL N/A 02/24/2018   Procedure: COLONOSCOPY WITH PROPOFOL;  Surgeon: Rachael Fee, MD;  Location: [facility] ENDOSCOPY;  Service: Endoscopy;  Laterality: N/A;   ICD  2003/2007   implanted by Dr Alanda Amass, most recent generator change 2/13 by Dr Johney Frame, Analyze ST study patient   ICD GENERATOR CHANGEOUT N/A 04/11/2020   Procedure: ICD GENERATOR CHANGEOUT;  Surgeon: Hillis Range, MD;  Location: [facility] INVASIVE CV LAB;  Service: Cardiovascular;  Laterality: N/A;   IMPLANTABLE CARDIOVERTER DEFIBRILLATOR (ICD) GENERATOR CHANGE N/A 05/05/2011   Procedure: ICD GENERATOR CHANGE;  Surgeon: Hillis Range, MD;  Location: [facility] CATH  LAB;  Service: Cardiovascular;  Laterality: N/A;   LEFT HEART CATHETERIZATION WITH CORONARY ANGIOGRAM N/A 01/30/2011   Procedure: LEFT HEART CATHETERIZATION WITH CORONARY ANGIOGRAM;  Surgeon: Laurey Morale, MD;  Location: [facility] CATH LAB;  Service: Cardiovascular;  Laterality: N/A;   POLYPECTOMY  02/24/2018   Procedure: POLYPECTOMY;  Surgeon: Rachael Fee, MD;  Location: [facility] ENDOSCOPY;   Service: Endoscopy;;   TUBALIGATION  1980     A IV Location/Drains/Wounds Patient Lines/Drains/Airways Status     Active Line/Drains/Airways     Name Placement date Placement time Site Days   Peripheral IV 10/02/22 20 G Anterior;Right Forearm 10/02/22  1214  Forearm  less than 1            Intake/Output Last 24 hours No intake or output data in the 24 hours ending 10/02/22 1514  Labs/Imaging Results for orders placed or performed during the hospital encounter of 10/02/22 (from the past 48 hour(s))  CBC     Status: Abnormal   Collection Time: 10/02/22 11:18 AM  Result Value Ref Range   WBC 8.0 4.0 - 10.5 K/uL   RBC 4.20 3.87 - 5.11 MIL/uL   Hemoglobin 12.4 12.0 - 15.0 g/dL   HCT 16.1 09.6 - 04.5 %   MCV 96.7 80.0 - 100.0 fL   MCH 29.5 26.0 - 34.0 pg   MCHC 30.5 30.0 - 36.0 g/dL   RDW 40.9 (H) 81.1 - 91.4 %   Platelets 215 150 - 400 K/uL   nRBC 0.0 0.0 - 0.2 %    Comment: Performed at [facility], [address] [place], [place] [address]  Comprehensive metabolic panel     Status: Abnormal   Collection Time: 10/02/22 12:10 PM  Result Value Ref Range   Sodium 141 135 - 145 mmol/L   Potassium 4.6 3.5 - 5.1 mmol/L   Chloride 102 98 - 111 mmol/L   CO2 21 (L) 22 - 32 mmol/L   Glucose, Bld 199 (H) 70 - 99 mg/dL    Comment: Glucose reference range applies only to samples taken after fasting for at least 8 hours.   BUN 36 (H) 8 - 23 mg/dL   Creatinine, Ser 6.21 (H) 0.44 - 1.00 mg/dL   Calcium 9.2 8.9 - 30.8 mg/dL   Total Protein 7.2 6.5 - 8.1 g/dL   Albumin 3.8 3.5 - 5.0 g/dL   AST 36 15 - 41 U/L   ALT 45 (H) 0 - 44 U/L   Alkaline Phosphatase 85 38 - 126 U/L   Total Bilirubin 0.3 0.3 - 1.2 mg/dL   GFR, Estimated 20 (L) >60 mL/min    Comment: (NOTE) Calculated using the CKD-EPI Creatinine Equation (2021)    Anion gap 18 (H) 5 - 15    Comment: Performed at [facility], [address], [place], [place] [address]  Troponin I (High Sensitivity)      Status: Abnormal   Collection Time: 10/02/22 12:10 PM  Result Value Ref Range   Troponin I (High Sensitivity) 45 (H) <18 ng/L    Comment: (NOTE) Elevated high sensitivity troponin I (hsTnI) values and significant  changes across serial measurements may suggest ACS but many other  chronic and acute conditions are known to elevate hsTnI results.  Refer to the "Links" section for chest pain algorithms and additional  guidance. Performed at [facility], [address] [place], [place] [address]   T4, free     Status: Abnormal   Collection  Time: 10/02/22 12:10 PM  Result Value Ref Range   Free T4 1.19 (H) 0.61 - 1.12 ng/dL    Comment: (NOTE) Biotin ingestion may interfere with free T4 tests. If the results are inconsistent with the TSH level, previous test results, or the clinical presentation, then consider biotin interference. If needed, order repeat testing after stopping biotin. Performed at [facility], [address] [place], [place] [address]   Magnesium     Status: Abnormal   Collection Time: 10/02/22 12:10 PM  Result Value Ref Range   Magnesium 2.5 (H) 1.7 - 2.4 mg/dL    Comment: Performed at [facility], [address] [place], [place] [address]  TSH     Status: None   Collection Time: 10/02/22 12:10 PM  Result Value Ref Range   TSH 2.833 0.350 - 4.500 uIU/mL    Comment: Performed by a 3rd Generation assay with a functional sensitivity of <=0.01 uIU/mL. Performed at [facility], [address] [place], [place] [address]    *Note: Due to a large number of results and/or encounters for the requested time period, some results have not been displayed. A complete set of results can be found in Results Review.   No results found.  Pending Labs Unresulted Labs (From admission, onward)     Start     Ordered   10/03/22 0500  Basic metabolic panel  Tomorrow morning,   R        10/02/22 1512   10/03/22 0500  CBC  Tomorrow morning,   R         10/02/22 1512   10/02/22 1511  Hemoglobin A1c  (Glycemic Control (SSI)  Q 4 Hours / Glycemic Control (SSI)  AC +/- HS)  Once,   R       Comments: To assess prior glycemic control    10/02/22 1512            Vitals/Pain Today's Vitals   10/02/22 1415 10/02/22 1430 10/02/22 1445 10/02/22 1500  BP: 123/70 (!) 120/47 108/67 119/76  Pulse: (!) 55 (!) 59 (!) 55 (!) 58  Resp: 14 16 16 15   Temp:      TempSrc:      SpO2: 100% 100% 99% 99%  PainSc:        Isolation Precautions No active isolations  Medications Medications  amiodarone (NEXTERONE) 1.8 mg/mL load via infusion 150 mg (150 mg Intravenous Bolus from Bag 10/02/22 1343)    Followed by  amiodarone (NEXTERONE PREMIX) 360-4.14 MG/200ML-% (1.8 mg/mL) IV infusion (60 mg/hr Intravenous New Bag/Given 10/02/22 1338)    Followed by  amiodarone (NEXTERONE PREMIX) 360-4.14 MG/200ML-% (1.8 mg/mL) IV infusion (has no administration in time range)  carvedilol (COREG) tablet 25 mg (has no administration in time range)  rosuvastatin (CRESTOR) tablet 40 mg (has no administration in time range)  busPIRone (BUSPAR) tablet 5 mg (has no administration in time range)  sertraline (ZOLOFT) tablet 50 mg (has no administration in time range)  insulin degludec (TRESIBA) 100 UNIT/ML FlexTouch Pen 12 Units (has no administration in time range)  dicyclomine (BENTYL) capsule 10 mg (has no administration in time range)  omeprazole (PRILOSEC OTC) EC tablet 20 mg (has no administration in time range)  apixaban (ELIQUIS) tablet 5 mg (has no administration in time range)  gabapentin (NEURONTIN) capsule 200 mg (has no administration in time range)  levETIRAcetam (KEPPRA) tablet 250 mg (has no administration in time range)  sodium chloride flush (NS)  0.9 % injection 3 mL (has no administration in time range)  lactated ringers infusion (has no administration in time range)  acetaminophen (TYLENOL) tablet 650 mg (has no administration in time range)    Or   acetaminophen (TYLENOL) suppository 650 mg (has no administration in time range)  polyethylene glycol (MIRALAX / GLYCOLAX) packet 17 g (has no administration in time range)  bisacodyl (DULCOLAX) EC tablet 5 mg (has no administration in time range)  ondansetron (ZOFRAN) tablet 4 mg (has no administration in time range)    Or  ondansetron (ZOFRAN) injection 4 mg (has no administration in time range)  hydrALAZINE (APRESOLINE) injection 5 mg (has no administration in time range)  insulin aspart (novoLOG) injection 0-9 Units (has no administration in time range)  lactated ringers bolus 500 mL (0 mLs Intravenous Stopped 10/02/22 1345)    Mobility walks     Focused Assessments Cardiac Assessment Handoff:  Cardiac Rhythm: Sinus tachycardia Lab Results  Component Value Date   CKTOTAL 98 01/28/2011   CKMB 2.8 01/28/2011   TROPONINI <0.30 01/28/2011   No results found for: "DDIMER" Does the Patient currently have chest pain? No    R Recommendations: See Admitting Provider Note  Report given to: 6E20

## 2022-10-02 NOTE — ED Provider Notes (Signed)
[place] [facility] PROGRESSIVE CARE Provider Note  CSN: [id number] Arrival date & time: 10/02/22 1103  Chief Complaint(s) Tachycardia and Dizziness  HPI [name] is a 77 y.o. female with PMH CAD status post DES placement, ICM with EF 25%, ventricular tachycardia with ICD in place, A-fib, previous CVA, DM, HTN, HLD, gastroparesis, hypothyroidism who presents emergency department for evaluation of dizziness, diarrhea and an episode of VT.  Patient recently started on Ranexa for chronic chest pain and has had significant diarrhea over the last 24 to 48 hours.  Patient suffered a ventricular tachycardic event on 10/01/2022 at 11:27 PM lasting approximately an hour and a half.  Patient car to come the emergency department for further evaluation.  Here in the emergency room, patient does not have any complaints and currently denies chest pain, shortness of breath, abdominal pain, nausea, vomiting or other systemic symptoms.   Past Medical History Past Medical History:  Diagnosis Date   Allergy    Arthritis    Atrial fibrillation (HCC)    controlled with amiodarone, on coumadin   Chronic renal insufficiency    Chronic systolic heart failure (HCC)    Coronary artery disease 01/31/2011   Diabetes mellitus    type 1   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    History of chicken pox    Hyperlipidemia    Hypertension    Ischemic cardiomyopathy    Lumbar spondylosis 01/11/2012   Sleep apnea    Stroke [facility]) 2010   eye doctor said she had TIA   Ventricular tachycardia (HCC)    Polymorphic   Patient Active Problem List   Diagnosis Date Noted   Ventricular tachycardia (HCC) 10/02/2022   Acute kidney injury superimposed on chronic kidney disease (HCC) 10/02/2022   Diarrhea 10/02/2022   Seizure disorder (HCC) 10/02/2022   VT (ventricular tachycardia) (HCC) 06/11/2022   Osteoarthritis 09/01/2021   DM (diabetes mellitus), type 2 with renal complications (HCC) 08/31/2021   Chronic  kidney disease, stage 3b (HCC) 08/31/2021   Encounter for well adult exam with abnormal findings 07/30/2018   Polyp of ascending colon    Hypothyroidism 08/02/2017   Personal history of ventricular tachycardia 08/02/2017   Diabetic peripheral neuropathy associated with type 2 diabetes mellitus (HCC) 07/02/2017   Hyperlipidemia 07/02/2017   Hypotension 05/06/2015   Colon cancer screening 12/21/2014   Acute sinus infection 06/06/2014   Allergic rhinitis 06/06/2014   Arthritis of knee, left 04/30/2014   Knee MCL sprain 03/28/2014   Pes anserine bursitis 02/23/2014   Spinal stenosis of lumbar region 02/23/2014   Lower back pain 01/23/2014   Bilateral knee pain 01/23/2014   Left lateral abdominal pain 01/23/2014   UTI (urinary tract infection) 08/03/2013   Encounter for therapeutic drug monitoring 04/26/2013   Chronic systolic CHF (congestive heart failure) (HCC) 03/20/2013   CKD (chronic kidney disease) 03/20/2013   HTN (hypertension) 12/15/2012   Obesity, unspecified 12/15/2012   Type 2 diabetes, uncontrolled, with circulatory disorder 12/15/2012   Left knee pain 08/30/2012   Lumbar spondylosis 01/11/2012   Coronary artery disease 01/31/2011   Implantable cardioverter-defibrillator (ICD) in situ 01/27/2011     ventricular tachycardia-polymorphic 01/27/2011   Ischemic cardiomyopathy 01/27/2011   PAF (paroxysmal atrial fibrillation) (HCC) 01/27/2011   Home Medication(s) Prior to Admission medications   Medication Sig Start Date End Date Taking? Authorizing Provider  acetaminophen (TYLENOL) 325 MG tablet Take 2 tablets (650 mg total) by mouth every 4 (four) hours as needed for mild pain or moderate  pain. 09/03/21   Elgergawy, Leana Roe, MD  amiodarone (PACERONE) 100 MG tablet TAKE 1 TABLET BY MOUTH EVERY DAY 08/21/22   Milford, Anderson Malta, FNP  amiodarone (PACERONE) 200 MG tablet Take 1 tablet (200 mg total) by mouth 2 (two) times daily. 07/15/22   Jacklynn Ganong, FNP  Blood Glucose  Monitoring Suppl (ACCU-CHEK NANO SMARTVIEW) W/DEVICE KIT Use to test blood sugar 4 times daily as instructed. Dx code: E11.59 03/01/14   Carlus Pavlov, MD  busPIRone (BUSPAR) 5 MG tablet Take 1 tablet (5 mg total) by mouth 2 (two) times daily. 05/20/21   Jacklynn Ganong, FNP  carvedilol (COREG) 12.5 MG tablet Take 2 tablets (25 mg total) by mouth 2 (two) times daily with a meal. 08/24/22 11/22/22  Camnitz, Andree Coss, MD  Cholecalciferol (VITAMIN D3) 2000 UNITS TABS Take 2,000 Units by mouth every morning.    [provider]  dicyclomine (BENTYL) 10 MG capsule TAKE 1 CAPSULE (10 MG TOTAL) BY MOUTH 4 (FOUR) TIMES DAILY BEFORE MEALS AND AT BEDTIME. 02/09/20   Corwin Levins, MD  Dulaglutide (TRULICITY) 3 MG/0.5ML SOPN Inject 3 mg into the skin once a week. 03/13/22   Carlus Pavlov, MD  ELIQUIS 5 MG TABS tablet TAKE 1 TABLET BY MOUTH TWICE A DAY 12/22/21   Laurey Morale, MD  empagliflozin (JARDIANCE) 10 MG TABS tablet Take 1 tablet (10 mg total) by mouth every morning. 11/11/21   Carlus Pavlov, MD  fluticasone (FLONASE) 50 MCG/ACT nasal spray Place 1 spray into both nostrils daily as needed for allergies or rhinitis. 03/13/19 07/15/23  [provider]  furosemide (LASIX) 20 MG tablet TAKE 1 TABLET BY MOUTH EVERY DAY 08/21/22   Milford, Anderson Malta, FNP  gabapentin (NEURONTIN) 100 MG capsule Take 2 capsules (200 mg total) by mouth 2 (two) times daily. 06/25/22   Carlus Pavlov, MD  insulin aspart (NOVOLOG FLEXPEN) 100 UNIT/ML FlexPen Inject up to 15 units daily under skin as advised 06/24/22   Carlus Pavlov, MD  insulin degludec (TRESIBA FLEXTOUCH) 100 UNIT/ML FlexTouch Pen Inject 12 Units into the skin daily. 03/13/22   Carlus Pavlov, MD  levETIRAcetam (KEPPRA) 250 MG tablet TAKE 1 TABLET BY MOUTH TWICE A DAY 09/16/22   Windell Norfolk, MD  losartan (COZAAR) 25 MG tablet Take 1 tablet (25 mg total) by mouth daily. 06/15/22 06/15/23  Graciella Freer, PA-C  magnesium  oxide (MAG-OX) 400 (240 Mg) MG tablet TAKE 1 TABLET BY MOUTH TWICE A DAY 12/19/21   Corwin Levins, MD  Multiple Vitamins-Minerals (EYE VITAMINS PO) Take 1 tablet by mouth 2 (two) times daily.    [provider]  nitroGLYCERIN (NITROSTAT) 0.4 MG SL tablet Place 1 tablet (0.4 mg total) under the tongue every 5 (five) minutes as needed for chest pain. 05/20/21   Milford, Anderson Malta, FNP  NOVOLOG 100 UNIT/ML injection INJECT 10-16 UNITS INTO THE SKIN 3 (THREE) TIMES DAILY WITH MEALS. 01/15/22   Carlus Pavlov, MD  omeprazole (PRILOSEC OTC) 20 MG tablet Take 20 mg by mouth every morning.    [provider]  potassium chloride SA (KLOR-CON M) 20 MEQ tablet Take 1 tablet (20 mEq total) by mouth daily. 06/22/22   Milford, Anderson Malta, FNP  rosuvastatin (CRESTOR) 40 MG tablet Take 1 tablet (40 mg total) by mouth daily. NEEDS FOLLOW UP APPOINTMENT FOR MORE REFILLS 09/17/22   Laurey Morale, MD  Semaglutide,0.25 or 0.5MG /DOS, (OZEMPIC, 0.25 OR 0.5 MG/DOSE,) 2 MG/3ML SOPN Inject 0.5 mg into  the skin once a week. 01/23/22   Carlus Pavlov, MD  sertraline (ZOLOFT) 25 MG tablet Take 50 mg by mouth at bedtime. 02/17/21   [provider]                                                                                                                                    Past Surgical History Past Surgical History:  Procedure Laterality Date   ABDOMINAL HYSTERECTOMY  1995   CARDIAC DEFIBRILLATOR PLACEMENT     CHOLECYSTECTOMY  1985   COLONOSCOPY WITH PROPOFOL N/A 02/24/2018   Procedure: COLONOSCOPY WITH PROPOFOL;  Surgeon: Rachael Fee, MD;  Location: [facility] ENDOSCOPY;  Service: Endoscopy;  Laterality: N/A;   ICD  2003/2007   implanted by Dr Alanda Amass, most recent generator change 2/13 by Dr Johney Frame, Analyze ST study patient   ICD GENERATOR CHANGEOUT N/A 04/11/2020   Procedure: ICD GENERATOR CHANGEOUT;  Surgeon: Hillis Range, MD;  Location: [facility] INVASIVE CV LAB;  Service: Cardiovascular;   Laterality: N/A;   IMPLANTABLE CARDIOVERTER DEFIBRILLATOR (ICD) GENERATOR CHANGE N/A 05/05/2011   Procedure: ICD GENERATOR CHANGE;  Surgeon: Hillis Range, MD;  Location: [facility] CATH LAB;  Service: Cardiovascular;  Laterality: N/A;   LEFT HEART CATHETERIZATION WITH CORONARY ANGIOGRAM N/A 01/30/2011   Procedure: LEFT HEART CATHETERIZATION WITH CORONARY ANGIOGRAM;  Surgeon: Laurey Morale, MD;  Location: [facility] CATH LAB;  Service: Cardiovascular;  Laterality: N/A;   POLYPECTOMY  02/24/2018   Procedure: POLYPECTOMY;  Surgeon: Rachael Fee, MD;  Location: [facility] ENDOSCOPY;  Service: Endoscopy;;   TUBALIGATION  1980   Family History Family History  Problem Relation Age of Onset   Diabetes Mother    Heart disease Mother    Hyperlipidemia Mother    Hypertension Mother    Heart disease Father    Heart attack Father    Hypertension Father    Breast cancer Sister    Lung cancer Sister    Irritable bowel syndrome Sister    Early death Brother 33   Colon cancer Maternal Aunt    Seizures Granddaughter    Esophageal cancer Neg Hx    Colon polyps Neg Hx     Social History Social History   Tobacco Use   Smoking status: Former    Current packs/day: 0.00    Types: Cigarettes    Quit date: 08/16/1995    Years since quitting: 27.1   Smokeless tobacco: Never  Vaping Use   Vaping status: Never Used  Substance Use Topics   Alcohol use: No   Drug use: No   Allergies Veltassa [patiromer], Darvon, Mexiletine hcl, Promethazine hcl, Ranexa [ranolazine er], Spironolactone, and Plavix [clopidogrel bisulfate]  Review of Systems Review of Systems  Cardiovascular:  Positive for palpitations.  Gastrointestinal:  Positive for diarrhea.  Neurological:  Positive for dizziness.    Physical Exam Vital Signs  I have reviewed the triage vital signs BP 103/66 (BP Location: Right Arm)  Pulse 65   Temp 97.7 F (36.5 C) (Oral)   Resp 16   Ht 5\' 3"  (1.6 m)   SpO2 99%   BMI 32.95 kg/m   Physical  Exam Vitals and nursing note reviewed.  Constitutional:      General: She is not in acute distress.    Appearance: She is well-developed.  HENT:     Head: Normocephalic and atraumatic.  Eyes:     Conjunctiva/sclera: Conjunctivae normal.  Cardiovascular:     Rate and Rhythm: Regular rhythm. Tachycardia present.     Heart sounds: No murmur heard. Pulmonary:     Effort: Pulmonary effort is normal. No respiratory distress.     Breath sounds: Normal breath sounds.  Abdominal:     Palpations: Abdomen is soft.     Tenderness: There is no abdominal tenderness.  Musculoskeletal:        General: No swelling.     Cervical back: Neck supple.  Skin:    General: Skin is warm and dry.     Capillary Refill: Capillary refill takes less than 2 seconds.  Neurological:     Mental Status: She is alert.  Psychiatric:        Mood and Affect: Mood normal.     ED Results and Treatments Labs (all labs ordered are listed, but only abnormal results are displayed) Labs Reviewed  CBC - Abnormal; Notable for the following components:      Result Value   RDW 16.3 (*)    All other components within normal limits  COMPREHENSIVE METABOLIC PANEL - Abnormal; Notable for the following components:   CO2 21 (*)    Glucose, Bld 199 (*)    BUN 36 (*)    Creatinine, Ser 2.41 (*)    ALT 45 (*)    GFR, Estimated 20 (*)    Anion gap 18 (*)    All other components within normal limits  T4, FREE - Abnormal; Notable for the following components:   Free T4 1.19 (*)    All other components within normal limits  MAGNESIUM - Abnormal; Notable for the following components:   Magnesium 2.5 (*)    All other components within normal limits  HEMOGLOBIN A1C - Abnormal; Notable for the following components:   Hgb A1c MFr Bld 9.2 (*)    All other components within normal limits  TROPONIN I (HIGH SENSITIVITY) - Abnormal; Notable for the following components:   Troponin I (High Sensitivity) 45 (*)    All other  components within normal limits  TROPONIN I (HIGH SENSITIVITY) - Abnormal; Notable for the following components:   Troponin I (High Sensitivity) 46 (*)    All other components within normal limits  TSH  BASIC METABOLIC PANEL  CBC                                                                                                                          Radiology No results found.  Pertinent labs &  imaging results that were available during my care of the patient were reviewed by me and considered in my medical decision making (see MDM for details).  Medications Ordered in ED Medications  amiodarone (NEXTERONE) 1.8 mg/mL load via infusion 150 mg (150 mg Intravenous Bolus from Bag 10/02/22 1343)    Followed by  amiodarone (NEXTERONE PREMIX) 360-4.14 MG/200ML-% (1.8 mg/mL) IV infusion (60 mg/hr Intravenous New Bag/Given 10/02/22 1338)    Followed by  amiodarone (NEXTERONE PREMIX) 360-4.14 MG/200ML-% (1.8 mg/mL) IV infusion (has no administration in time range)  carvedilol (COREG) tablet 25 mg (has no administration in time range)  rosuvastatin (CRESTOR) tablet 40 mg (has no administration in time range)  busPIRone (BUSPAR) tablet 5 mg (has no administration in time range)  sertraline (ZOLOFT) tablet 50 mg (has no administration in time range)  insulin degludec (TRESIBA) 100 UNIT/ML FlexTouch Pen 12 Units (has no administration in time range)  dicyclomine (BENTYL) capsule 10 mg (has no administration in time range)  omeprazole (PRILOSEC OTC) EC tablet 20 mg (has no administration in time range)  apixaban (ELIQUIS) tablet 5 mg (has no administration in time range)  gabapentin (NEURONTIN) capsule 200 mg (has no administration in time range)  levETIRAcetam (KEPPRA) tablet 250 mg (has no administration in time range)  sodium chloride flush (NS) 0.9 % injection 3 mL (0 mLs Intravenous Hold 10/02/22 1524)  lactated ringers infusion (has no administration in time range)  acetaminophen (TYLENOL) tablet  650 mg (has no administration in time range)    Or  acetaminophen (TYLENOL) suppository 650 mg (has no administration in time range)  polyethylene glycol (MIRALAX / GLYCOLAX) packet 17 g (has no administration in time range)  bisacodyl (DULCOLAX) EC tablet 5 mg (has no administration in time range)  ondansetron (ZOFRAN) tablet 4 mg (has no administration in time range)    Or  ondansetron (ZOFRAN) injection 4 mg (has no administration in time range)  hydrALAZINE (APRESOLINE) injection 5 mg (has no administration in time range)  insulin aspart (novoLOG) injection 0-9 Units (has no administration in time range)  lactated ringers bolus 500 mL (0 mLs Intravenous Stopped 10/02/22 1345)                                                                                                                                     Procedures Procedures  (including critical care time)  Medical Decision Making / ED Course   This patient presents to the ED for concern of dizziness, palpitations, this involves an extensive number of treatment options, and is a complaint that carries with it a high risk of complications and morbidity.  The differential diagnosis includes ventricular tachycardia, electrolyte abnormality, dehydration, failure of outpatient medication regimen, medication side effect  MDM: Patient seen emergency room for evaluation of palpitations a device alert for ventricular tachycardia.  Physical exam is unremarkable outside of a regular tachycardia.  Laboratory evaluation with new AKI with  BUN 36, creatinine 2.41, high-sensitivity troponin 45 with delta troponin 46.  Patient evaluated by the EP team at bedside who is recommending medical AKI and they will follow along.  They started amiodarone and patient admitted.   Additional history obtained: -Additional history obtained from son -External records from outside source obtained and reviewed including: Chart review including previous notes, labs,  imaging, consultation notes   Lab Tests: -I ordered, reviewed, and interpreted labs.   The pertinent results include:   Labs Reviewed  CBC - Abnormal; Notable for the following components:      Result Value   RDW 16.3 (*)    All other components within normal limits  COMPREHENSIVE METABOLIC PANEL - Abnormal; Notable for the following components:   CO2 21 (*)    Glucose, Bld 199 (*)    BUN 36 (*)    Creatinine, Ser 2.41 (*)    ALT 45 (*)    GFR, Estimated 20 (*)    Anion gap 18 (*)    All other components within normal limits  T4, FREE - Abnormal; Notable for the following components:   Free T4 1.19 (*)    All other components within normal limits  MAGNESIUM - Abnormal; Notable for the following components:   Magnesium 2.5 (*)    All other components within normal limits  HEMOGLOBIN A1C - Abnormal; Notable for the following components:   Hgb A1c MFr Bld 9.2 (*)    All other components within normal limits  TROPONIN I (HIGH SENSITIVITY) - Abnormal; Notable for the following components:   Troponin I (High Sensitivity) 45 (*)    All other components within normal limits  TROPONIN I (HIGH SENSITIVITY) - Abnormal; Notable for the following components:   Troponin I (High Sensitivity) 46 (*)    All other components within normal limits  TSH  BASIC METABOLIC PANEL  CBC      EKG   EKG Interpretation Date/Time:  Friday October 02 2022 10:47:28 EDT Ventricular Rate:  119 PR Interval:    QRS Duration:  142 QT Interval:  386 QTC Calculation: 542 R Axis:   -54  Text Interpretation: A-paced rhythm Right bundle branch block Abnormal ECG When compared with ECG of 22-Jun-2022 11:53, PREVIOUS ECG IS PRESENT Confirmed by Rayne Loiseau ([id number]) on 10/02/2022 11:57:20 AM         Medicines ordered and prescription drug management: Meds ordered this encounter  Medications   DISCONTD: lactated ringers bolus 1,000 mL   lactated ringers bolus 500 mL   FOLLOWED BY Linked Order Group     amiodarone (NEXTERONE) 1.8 mg/mL load via infusion 150 mg    amiodarone (NEXTERONE PREMIX) 360-4.14 MG/200ML-% (1.8 mg/mL) IV infusion    amiodarone (NEXTERONE PREMIX) 360-4.14 MG/200ML-% (1.8 mg/mL) IV infusion   carvedilol (COREG) tablet 25 mg   rosuvastatin (CRESTOR) tablet 40 mg    NEEDS FOLLOW UP APPOINTMENT FOR MORE REFILLS     busPIRone (BUSPAR) tablet 5 mg   sertraline (ZOLOFT) tablet 50 mg   insulin degludec (TRESIBA) 100 UNIT/ML FlexTouch Pen 12 Units   dicyclomine (BENTYL) capsule 10 mg    TAKE 1 CAPSULE (10 MG TOTAL) BY MOUTH 4 (FOUR) TIMES DAILY BEFORE MEALS AND AT BEDTIME.     omeprazole (PRILOSEC OTC) EC tablet 20 mg   apixaban (ELIQUIS) tablet 5 mg   gabapentin (NEURONTIN) capsule 200 mg   levETIRAcetam (KEPPRA) tablet 250 mg   sodium chloride flush (NS) 0.9 % injection 3 mL   lactated ringers  infusion   OR Linked Order Group    acetaminophen (TYLENOL) tablet 650 mg    acetaminophen (TYLENOL) suppository 650 mg   polyethylene glycol (MIRALAX / GLYCOLAX) packet 17 g   bisacodyl (DULCOLAX) EC tablet 5 mg   OR Linked Order Group    ondansetron (ZOFRAN) tablet 4 mg    ondansetron (ZOFRAN) injection 4 mg   hydrALAZINE (APRESOLINE) injection 5 mg   insulin aspart (novoLOG) injection 0-9 Units    Order Specific Question:   Correction coverage:    Answer:   Sensitive (thin, NPO, renal)    Order Specific Question:   CBG < 70:    Answer:   Implement Hypoglycemia Standing Orders and refer to Hypoglycemia Standing Orders sidebar report    Order Specific Question:   CBG 70 - 120:    Answer:   0 units    Order Specific Question:   CBG 121 - 150:    Answer:   1 unit    Order Specific Question:   CBG 151 - 200:    Answer:   2 units    Order Specific Question:   CBG 201 - 250:    Answer:   3 units    Order Specific Question:   CBG 251 - 300:    Answer:   5 units    Order Specific Question:   CBG 301 - 350:    Answer:   7 units    Order Specific Question:   CBG 351 - 400     Answer:   9 units    Order Specific Question:   CBG > 400    Answer:   call MD and obtain STAT lab verification    -I have reviewed the patients home medicines and have made adjustments as needed  Critical interventions none  Consultations Obtained: I requested consultation with the electrophysiology team,  and discussed lab and imaging findings as well as pertinent plan - they recommend: Hospital admission   Cardiac Monitoring: The patient was maintained on a cardiac monitor.  I personally viewed and interpreted the cardiac monitored which showed an underlying rhythm of: A paced rhythm  Social Determinants of Health:  Factors impacting patients care include: none   Reevaluation: After the interventions noted above, I reevaluated the patient and found that they have :improved  Co morbidities that complicate the patient evaluation  Past Medical History:  Diagnosis Date   Allergy    Arthritis    Atrial fibrillation (HCC)    controlled with amiodarone, on coumadin   Chronic renal insufficiency    Chronic systolic heart failure (HCC)    Coronary artery disease 01/31/2011   Diabetes mellitus    type 1   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    History of chicken pox    Hyperlipidemia    Hypertension    Ischemic cardiomyopathy    Lumbar spondylosis 01/11/2012   Sleep apnea    Stroke [facility]) 2010   eye doctor said she had TIA   Ventricular tachycardia (HCC)    Polymorphic      Dispostion: I considered admission for this patient, and due to ventricular tachycardia with new AKI patient require hospital mission     Final Clinical Impression(s) / ED Diagnoses Final diagnoses:  VT (ventricular tachycardia) (HCC)  AKI (acute kidney injury) (HCC)     @PCDICTATION @    Glendora Score, MD 10/02/22 1712

## 2022-10-02 NOTE — H&P (Signed)
History and Physical    Patient: [name] WUJ:[id number] DOB: 07-Apr-1945 DOA: 10/02/2022 DOS: the patient was seen and examined on 10/02/2022 PCP: Pcp, No  Patient coming from: Home - lives with son, his fiancee, and grandchildren; NOK: Son, [phone]   Chief Complaint: Abnormal heart rhythm  HPI: [name] is a 77 y.o. female with medical history significant of afib on Coumadin, chronic systolic CHF with AICD, CAD, DM, HTN, HLD, and OSA presenting with an episode of VT lasting from 1127-1355 with HR 150.  She was started on Ranexa on 7/9 and developed intractable diarrhea on 7/10.  She was notified by the heart clinic that her ICD was detecting problems.  She sent a tracing with her device and it was still showing abnormal rhythm.  No symptoms.  This also happened a couple of weeks ago.  She was feeling a little weak.  She started taking Ranexa on on Tuesday night this week.  Wednesday about 0500, she awoke with diarrhea.  She had persistent diarrhea throughout the day Wednesday.  It got so bad that she put a diaper on.  She only took the one dose of Ranexa.  Diarrhea improved a little yesterday, only 1 stool today.  No sick contacts.  No n/v.    ER Course:  Has AICD for vtach, failed lots of prior medications. Started recently on Ranexa -> copious diarrhea (12-14 watery BMs).  New AKI.  New VT - called by AICD monitoring company.  EP has seen, will consult.  On Amiodarone drip.       Review of Systems: As mentioned in the history of present illness. All other systems reviewed and are negative. Past Medical History:  Diagnosis Date   Allergy    Arthritis    Atrial fibrillation (HCC)    controlled with amiodarone, on coumadin   Chronic renal insufficiency    Chronic systolic heart failure (HCC)    Coronary artery disease 01/31/2011   Diabetes mellitus    type 1   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    History of chicken pox    Hyperlipidemia     Hypertension    Ischemic cardiomyopathy    Lumbar spondylosis 01/11/2012   Sleep apnea    Stroke [facility]) 2010   eye doctor said she had TIA   Ventricular tachycardia (HCC)    Polymorphic   Past Surgical History:  Procedure Laterality Date   ABDOMINAL HYSTERECTOMY  1995   CARDIAC DEFIBRILLATOR PLACEMENT     CHOLECYSTECTOMY  1985   COLONOSCOPY WITH PROPOFOL N/A 02/24/2018   Procedure: COLONOSCOPY WITH PROPOFOL;  Surgeon: Rachael Fee, MD;  Location: [facility];  Service: Endoscopy;  Laterality: N/A;   ICD  2003/2007   implanted by Dr Alanda Amass, most recent generator change 2/13 by Dr Johney Frame, Analyze ST study patient   ICD GENERATOR CHANGEOUT N/A 04/11/2020   Procedure: ICD GENERATOR CHANGEOUT;  Surgeon: Hillis Range, MD;  Location: [facility] INVASIVE CV LAB;  Service: Cardiovascular;  Laterality: N/A;   IMPLANTABLE CARDIOVERTER DEFIBRILLATOR (ICD) GENERATOR CHANGE N/A 05/05/2011   Procedure: ICD GENERATOR CHANGE;  Surgeon: Hillis Range, MD;  Location: [facility] CATH LAB;  Service: Cardiovascular;  Laterality: N/A;   LEFT HEART CATHETERIZATION WITH CORONARY ANGIOGRAM N/A 01/30/2011   Procedure: LEFT HEART CATHETERIZATION WITH CORONARY ANGIOGRAM;  Surgeon: Laurey Morale, MD;  Location: [facility] CATH LAB;  Service: Cardiovascular;  Laterality: N/A;   POLYPECTOMY  02/24/2018   Procedure: POLYPECTOMY;  Surgeon: Rachael Fee, MD;  Location: [facility];  Service: Endoscopy;;   TUBALIGATION  1980   Social History:  reports that she quit smoking about 27 years ago. Her smoking use included cigarettes. She has never used smokeless tobacco. She reports that she does not drink alcohol and does not use drugs.  Allergies  Allergen Reactions   Veltassa [Patiromer] Diarrhea   Darvon Other (See Comments)    indigestion   Mexiletine Hcl     N/V, tremors, difficulty walking, blurred vision   Promethazine Hcl Other (See Comments)    hyperactivity   Ranexa [Ranolazine Er] Diarrhea   Spironolactone Diarrhea and  Nausea And Vomiting   Plavix [Clopidogrel Bisulfate] Rash    Family History  Problem Relation Age of Onset   Diabetes Mother    Heart disease Mother    Hyperlipidemia Mother    Hypertension Mother    Heart disease Father    Heart attack Father    Hypertension Father    Breast cancer Sister    Lung cancer Sister    Irritable bowel syndrome Sister    Early death Brother 69   Colon cancer Maternal Aunt    Seizures Granddaughter    Esophageal cancer Neg Hx    Colon polyps Neg Hx     Prior to Admission medications   Medication Sig Start Date End Date Taking? Authorizing Provider  acetaminophen (TYLENOL) 325 MG tablet Take 2 tablets (650 mg total) by mouth every 4 (four) hours as needed for mild pain or moderate pain. 09/03/21   Elgergawy, Leana Roe, MD  amiodarone (PACERONE) 100 MG tablet TAKE 1 TABLET BY MOUTH EVERY DAY 08/21/22   Milford, Anderson Malta, FNP  amiodarone (PACERONE) 200 MG tablet Take 1 tablet (200 mg total) by mouth 2 (two) times daily. 07/15/22   Jacklynn Ganong, FNP  Blood Glucose Monitoring Suppl (ACCU-CHEK NANO SMARTVIEW) W/DEVICE KIT Use to test blood sugar 4 times daily as instructed. Dx code: E11.59 03/01/14   Carlus Pavlov, MD  busPIRone (BUSPAR) 5 MG tablet Take 1 tablet (5 mg total) by mouth 2 (two) times daily. 05/20/21   Jacklynn Ganong, FNP  carvedilol (COREG) 12.5 MG tablet Take 2 tablets (25 mg total) by mouth 2 (two) times daily with a meal. 08/24/22 11/22/22  Camnitz, Andree Coss, MD  Cholecalciferol (VITAMIN D3) 2000 UNITS TABS Take 2,000 Units by mouth every morning.    [provider]  dicyclomine (BENTYL) 10 MG capsule TAKE 1 CAPSULE (10 MG TOTAL) BY MOUTH 4 (FOUR) TIMES DAILY BEFORE MEALS AND AT BEDTIME. 02/09/20   Corwin Levins, MD  Dulaglutide (TRULICITY) 3 MG/0.5ML SOPN Inject 3 mg into the skin once a week. 03/13/22   Carlus Pavlov, MD  ELIQUIS 5 MG TABS tablet TAKE 1 TABLET BY MOUTH TWICE A DAY 12/22/21   Laurey Morale, MD   empagliflozin (JARDIANCE) 10 MG TABS tablet Take 1 tablet (10 mg total) by mouth every morning. 11/11/21   Carlus Pavlov, MD  fluticasone (FLONASE) 50 MCG/ACT nasal spray Place 1 spray into both nostrils daily as needed for allergies or rhinitis. 03/13/19 07/15/23  [provider]  furosemide (LASIX) 20 MG tablet TAKE 1 TABLET BY MOUTH EVERY DAY 08/21/22   Milford, Anderson Malta, FNP  gabapentin (NEURONTIN) 100 MG capsule Take 2 capsules (200 mg total) by mouth 2 (two) times daily. 06/25/22   Carlus Pavlov, MD  insulin aspart (NOVOLOG FLEXPEN) 100 UNIT/ML FlexPen Inject up to 15 units daily under skin as advised 06/24/22  Carlus Pavlov, MD  insulin degludec (TRESIBA FLEXTOUCH) 100 UNIT/ML FlexTouch Pen Inject 12 Units into the skin daily. 03/13/22   Carlus Pavlov, MD  insulin detemir (LEVEMIR) 100 UNIT/ML injection Inject 12 Units into the skin at bedtime. 11/25/21   [provider]  levETIRAcetam (KEPPRA) 250 MG tablet TAKE 1 TABLET BY MOUTH TWICE A DAY 09/16/22   Windell Norfolk, MD  losartan (COZAAR) 25 MG tablet Take 1 tablet (25 mg total) by mouth daily. 06/15/22 06/15/23  Graciella Freer, PA-C  magnesium oxide (MAG-OX) 400 (240 Mg) MG tablet TAKE 1 TABLET BY MOUTH TWICE A DAY 12/19/21   Corwin Levins, MD  Multiple Vitamins-Minerals (EYE VITAMINS PO) Take 1 tablet by mouth 2 (two) times daily.    [provider]  nitroGLYCERIN (NITROSTAT) 0.4 MG SL tablet Place 1 tablet (0.4 mg total) under the tongue every 5 (five) minutes as needed for chest pain. 05/20/21   Milford, Anderson Malta, FNP  NOVOLOG 100 UNIT/ML injection INJECT 10-16 UNITS INTO THE SKIN 3 (THREE) TIMES DAILY WITH MEALS. 01/15/22   Carlus Pavlov, MD  omeprazole (PRILOSEC OTC) 20 MG tablet Take 20 mg by mouth every morning.    [provider]  potassium chloride SA (KLOR-CON M) 20 MEQ tablet Take 1 tablet (20 mEq total) by mouth daily. 06/22/22   Milford, Anderson Malta, FNP  ranolazine (RANEXA)  500 MG 12 hr tablet Take 1 tablet (500 mg total) by mouth 2 (two) times daily. 09/23/22 10/23/22  Camnitz, Andree Coss, MD  rosuvastatin (CRESTOR) 40 MG tablet Take 1 tablet (40 mg total) by mouth daily. NEEDS FOLLOW UP APPOINTMENT FOR MORE REFILLS 09/17/22   Laurey Morale, MD  Semaglutide,0.25 or 0.5MG /DOS, (OZEMPIC, 0.25 OR 0.5 MG/DOSE,) 2 MG/3ML SOPN Inject 0.5 mg into the skin once a week. 01/23/22   Carlus Pavlov, MD  sertraline (ZOLOFT) 25 MG tablet Take 50 mg by mouth at bedtime. 02/17/21   [provider]    Physical Exam: Vitals:   10/02/22 1415 10/02/22 1430 10/02/22 1445 10/02/22 1500  BP: 123/70 (!) 120/47 108/67 119/76  Pulse: (!) 55 (!) 59 (!) 55 (!) 58  Resp: 14 16 16 15   Temp:      TempSrc:      SpO2: 100% 100% 99% 99%   General:  Appears calm and comfortable and is in NAD Eyes:   EOMI, normal lids, iris ENT:  grossly normal hearing, lips & tongue, mmm; poor/absent dentition Neck:  no LAD, masses or thyromegaly Cardiovascular:  RRR. No LE edema. 20+ beat run of vtach while I was at the bedside - patient was asymptomatic throughout Respiratory:   CTA bilaterally with no wheezes/rales/rhonchi.  Normal respiratory effort. Abdomen:  soft, NT, ND Back:   normal alignment, no CVAT Skin:  no rash or induration seen on limited exam Musculoskeletal:  grossly normal tone BUE/BLE, good ROM, no bony abnormality Psychiatric: blunted mood and affect, speech fluent and appropriate, AOx3 Neurologic:  CN 2-12 grossly intact, moves all extremities in coordinated fashion   Radiological Exams on Admission: Independently reviewed - see discussion in A/P where applicable  No results found.  EKG: Independently reviewed.  Paced with rate 119, RBBB   Labs on Admission: I have personally reviewed the available labs and imaging studies at the time of the admission.  Pertinent labs:    Glucose 199 BUN 36/Creatinine 2.41/GFR 20; 29/1.82/29 on 4/24 Anion gap 18 HS troponin  45 Unremarkable CBC TSH 2.833, Free T4 1.19  Assessment and Plan: Principal Problem:   Ventricular tachycardia (HCC) Active Problems:   PAF (paroxysmal atrial fibrillation) (HCC)   HTN (hypertension)   Chronic systolic CHF (congestive heart failure) (HCC)   Hyperlipidemia   DM (diabetes mellitus), type 2 with renal complications (HCC)   Chronic kidney disease, stage 3b (HCC)   Acute kidney injury superimposed on chronic kidney disease (HCC)   Diarrhea   Seizure disorder (HCC)    Recurrent ventricular tachycardia Patient was previously admitted from 3/21-25 with multiple VT episodes At that time, she had AKI She was cardioverted in the ED and reloaded with IV amiodarone without improvement She was most recently seen in cardiology clinic on 4/30 Call to cards clinic on 7/2 from [employer] for VT "in the monitor zone" Ranexa was called in Within 12 hours of starting Ranexa, she developed intractable diarrhea; she did not take another dose and diarrhea has gradually waned off [employer] called again for for Vtach lasting 1h36m She was reloaded with Amiodarone  Continued to have VT during my evaluation ICD is not firing - ?need for alternate parameters Management per cardiology/EP  AKI on stage 3b CKD Current creatinine is increased at least 1.5 times compared to baseline and presumed to have occurred within the last 7 days Likely due to prerenal secondary to severe dehydration in the setting of intractable diarrhea IVF GENTLY Follow up renal function by BMP Avoid ACEI and NSAIDs   Diarrhea This is not a known/reported side effect of Ranexa, but the timetable is strongly suggestive that diarrhea was medication-related Continue to hold Ranexa Ranexa has been added to her allergy list Should diarrhea resume/increase, stool testing should be considered Continue Bentyl  Chronic systolic CHF Recent EF 25-30% Appears compensated at this time Giving gentle  IVF hydration - needs close monitoring for volume overload  Afib Rate controlled at this time with amiodarone, carvedilol Continue Eliquis  CAD S/p LAD and RCA PCI No complaint of chest pain or discomfort  Anxiety Continue buspirone, sertraline  Seizure d/o Continue Keppra  HTN Continue carvedilol Hold losartan  DM Will check A1c hold Trulicity, Jardiance, Ozempic Continue glargine Cover with sensitive-scale SSI  Continue gabapentin  HLD Continue rosuvastatin    Advance Care Planning:   Code Status: Full Code  - Code status was discussed with the patient and son at the time of admission.  The patient would want to receive full resuscitative measures at this time.   Consults: Cardiology/EP; OT/PT; nutrition; TOC team  DVT Prophylaxis: Eliquis  Family Communication: Son was present throughout evaluation  Severity of Illness: The appropriate patient status for this patient is INPATIENT. Inpatient status is judged to be reasonable and necessary in order to provide the required intensity of service to ensure the patient's safety. The patient's presenting symptoms, physical exam findings, and initial radiographic and laboratory data in the context of their chronic comorbidities is felt to place them at high risk for further clinical deterioration. Furthermore, it is not anticipated that the patient will be medically stable for discharge from the hospital within 2 midnights of admission.   * I certify that at the point of admission it is my clinical judgment that the patient will require inpatient hospital care spanning beyond 2 midnights from the point of admission due to high intensity of service, high risk for further deterioration and high frequency of surveillance required.*  Author: Jonah Blue, MD 10/02/2022 3:36 PM  For on call review [email or web address].

## 2022-10-02 NOTE — ED Triage Notes (Signed)
Pt reports starting a new cardiac medication a few days ago and started having uncontrollable diarrhea. Pt had one episode of feeling dizzy and nauseated. Pt called cardiology yesterday and she was told to come here today due to having a run of Vtach, ICD in place, pt denies feeling any shocks. Pt a.o, denies chest pain or SOB

## 2022-10-02 NOTE — Telephone Encounter (Signed)
Following alert received from [employer] received for VT in the monitor zone, event occurred 10/01/22 @ 11:27, duration 1hr , HR 150. Ranexa prescribed 09/23/22.  Presenting rhythm VT ~115 bpm.  Patient reports she started taking Ranexa 09/29/22 or 09/30/22. States shortly after taking Ranexa she started having uncontrollable diarrhea 09/30/22 and still experiencing symptoms. Reports yesterday onset of dizziness while ambulating, increase fatigue and nausea. Patient does not have a way to check BP at home. Report this am of feeling weak and continuing diarrhea this am. Did not take Renexa yesterday 10/01/22 to see if diarrhea would stop.   No providers in office today to consult with. Secure chat sent to [place], [place] who is covering in the hospital for recommendations.  Advised patient I will call her back as soon as I hear back from provider about next steps. Patient given ED precautions with verbal understanding.

## 2022-10-02 NOTE — Plan of Care (Signed)

## 2022-10-02 NOTE — ED Notes (Signed)
Pt had st Jude ICD

## 2022-10-02 NOTE — Consult Note (Signed)
ELECTROPHYSIOLOGY CONSULT NOTE    Patient ID: BHAVYA FABIEN MRN: [id number], DOB/AGE: 12/11/1945 77 y.o.  Admit date: 10/02/2022 Date of Consult: 10/02/2022  Primary Physician: Oneita Hurt, No Primary Cardiologist: [name], MD  Electrophysiologist: Dr. Elberta Fortis   Referring Provider: Dr. Lalla Brothers  Patient Profile: [name] is a 77 y.o. female with a history of hypothyroidism, HLD, CAD, ICM / chronic systolic CHF (LVEF 25-30%), G1 DD, AF, & VT who is being seen today for the evaluation of ventricular tachycardia at the request of Dr. Tresa Moore.  HPI:  [name] is a 77 y.o. female who presented to [facility] ER after an device alert notified of VT.   The patient was admitted from 3/21-3/24/24 in VT storm.  At that time she had AKI with hyperkalemia.  At that time, she was having VT in zone 1 and 2 and her VT zones were adjusted (zone 1 to 139 and zone 2 to 164). At hospital follow up, she had some nausea in the setting of increased amiodarone.    She was started on Ranexa on 09/30/22.  She reported after starting the ranexa, she began having uncontrollable diarrhea which is ongoing. On 7/11, she began feeling dizzy with ambulation, nausea and increased fatigue. She held her Ranexa on 7/11.  Remote check review shows slow VT with cycle length of 480-530.  She indicates she only took one dose of ranexa and then had thin, watery, dark stools. It was so profuse that she had to wear a diaper and was at times unable to make it to the restroom. She denies known sick contacts, fevers, vomiting. Reports chills at times but this is not unusual for her.   Initial labs: Na 141, K 4.6, Cl 102, CO2 21, glucose 199, BUN 36, Cr 2.41, AG 18, Mg 2.5, AST 36 / ALT 45, WBC 8, Hgb 12.4, & platelets 215.   While in ER, she spontaneously converted to SB at 12:38.  She was loaded on IV amiodarone for VT.  The patient reports she was asymptomatic when in VT.  No palpitations or discomfort. Occasional  lightheadedness but also not new.   She denies chest pain, palpitations, dyspnea, PND, orthopnea, nausea, vomiting, dizziness, syncope, edema, weight gain, or early satiety.   Labs Potassium4.6 (07/12 1210) Magnesium  2.5* (07/12 1210) Creatinine, ser  2.41* (07/12 1210) PLT  215 (07/12 1118) HGB  12.4 (07/12 1118) WBC 8.0 (07/12 1118) Troponin I (High Sensitivity)45* (07/12 1210).    Past Medical History:  Diagnosis Date   Allergy    Arthritis    Atrial fibrillation (HCC)    controlled with amiodarone, on coumadin   Chronic renal insufficiency    Chronic systolic heart failure (HCC)    Congestive heart failure (HCC)    Coronary artery disease 01/31/2011   Diabetes mellitus    type 1   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    History of chicken pox    Hyperlipidemia    Hypertension    Ischemic cardiomyopathy    Lumbar spondylosis 01/11/2012   Sleep apnea    Stroke [facility]) 2010   eye doctor said she had TIA   Ventricular tachycardia (HCC)    Polymorphic     Surgical History:  Past Surgical History:  Procedure Laterality Date   ABDOMINAL HYSTERECTOMY  1995   CARDIAC DEFIBRILLATOR PLACEMENT     CHOLECYSTECTOMY  1985   COLONOSCOPY WITH PROPOFOL N/A 02/24/2018   Procedure: COLONOSCOPY WITH PROPOFOL;  Surgeon: Rob Bunting  P, MD;  Location: [facility];  Service: Endoscopy;  Laterality: N/A;   ICD  2003/2007   implanted by Dr Alanda Amass, most recent generator change 2/13 by Dr Johney Frame, Analyze ST study patient   ICD GENERATOR CHANGEOUT N/A 04/11/2020   Procedure: ICD GENERATOR CHANGEOUT;  Surgeon: Hillis Range, MD;  Location: [facility] INVASIVE CV LAB;  Service: Cardiovascular;  Laterality: N/A;   IMPLANTABLE CARDIOVERTER DEFIBRILLATOR (ICD) GENERATOR CHANGE N/A 05/05/2011   Procedure: ICD GENERATOR CHANGE;  Surgeon: Hillis Range, MD;  Location: [facility] CATH LAB;  Service: Cardiovascular;  Laterality: N/A;   LEFT HEART CATHETERIZATION WITH CORONARY ANGIOGRAM N/A 01/30/2011    Procedure: LEFT HEART CATHETERIZATION WITH CORONARY ANGIOGRAM;  Surgeon: Laurey Morale, MD;  Location: [facility] CATH LAB;  Service: Cardiovascular;  Laterality: N/A;   POLYPECTOMY  02/24/2018   Procedure: POLYPECTOMY;  Surgeon: Rachael Fee, MD;  Location: [facility];  Service: Endoscopy;;   Clint Lipps     (Not in a hospital admission)   Inpatient Medications:   Allergies:  Allergies  Allergen Reactions   Veltassa [Patiromer] Diarrhea   Darvon Other (See Comments)    indigestion   Mexiletine Hcl     N/V, tremors, difficulty walking, blurred vision   Promethazine Hcl Other (See Comments)    hyperactivity   Spironolactone Diarrhea and Nausea And Vomiting   Plavix [Clopidogrel Bisulfate] Rash    Family History  Problem Relation Age of Onset   Diabetes Mother    Heart disease Mother    Hyperlipidemia Mother    Hypertension Mother    Heart disease Father    Heart attack Father    Hypertension Father    Breast cancer Sister    Lung cancer Sister    Irritable bowel syndrome Sister    Early death Brother 5   Colon cancer Maternal Aunt    Seizures Granddaughter    Esophageal cancer Neg Hx    Colon polyps Neg Hx      Physical Exam: Vitals:   10/02/22 1109 10/02/22 1153 10/02/22 1345  BP: 116/64 126/70 (!) 108/94  Pulse: (!) 119 (!) 115 (!) 55  Resp: 16 16 17   Temp: 98.2 F (36.8 C)    TempSrc: Oral    SpO2: 96% 100% 100%    GEN- NAD, A&O x 3, normal affect  HEENT: Normocephalic, atraumatic Lungs- CTAB, Normal effort.  Heart- Regular rate and rhythm, No M/G/R.  GI- Soft, NT, ND.  Extremities- No clubbing, cyanosis, or edema   Radiology/Studies: No results found.  EKG: WCT 119 by device interrogation (personally reviewed)  TELEMETRY: SB (personally reviewed)   STUDIES:  06/15/22 ECHO > LVEF 25-30%, global LV hypokinesis, grade I DD, RV systolic function normal, LA mildly dilated, moderate MVR, mod-severe AR  DEVICE HISTORY:  EP Study 2003 >  inducible PMVT  [employer]bbott / SJM Dual Chamber ICD, initial implant 2003, 2007.  Gen Change: 2013, 03/2020 - at 03/2020 gen change, SJM SJM Riata ST 7040 lead was fluoroscopically and electrically normal, opted to use this lead rather than replacing it after a long discussion with the patient prior to the procedure  2012 PMVT with ICD shocks 04/2021 appropriate tx for VT with HV therapy  AAD Hx 2007 amiodarone started quickly stopped 2/2 nausea Remotely as well tried on Sotalol and Multaq unclear when/why they were stopped 2012 restarted on amiodarone 2024 Mexiletine intolerant, transitioned to Ranexa    Assessment/Plan:  Recurrent Ventricular Tachycardia Recent diarrheal illness, recurrent VT and recent admit of VT  storm.   -reload with amiodarone, IV bolus then infusion  -assess BMP, CBC, TSH/free T4 -hold ranexa   Diarrhea / Hypovolemia  -suspect hypovolemic given mild AKI in setting of VT -judicious volume replacement  -infectious / viral work up per primary  -no recent abx per pt   Chronic Systolic CHF, LVEF 25-30% CAD  -hold losartan, farxiga, lasix with mild AKI for now  -reassess for restart in am    Atrial Fibrillation  -continue coreg, eliquis    Secondary Hypercoagulable State -continue eliquis      For questions or updates, please contact [facility] Please consult [email or web address] for contact info under Cardiology/STEMI.  Signed, Canary Brim, MSN, APRN, NP-C, AGACNP-BC [facility] - Electrophysiology  10/02/2022, 2:29 PM

## 2022-10-02 NOTE — Telephone Encounter (Signed)
Spoke to [employer], [place] who advised patient to come to [facility]. Patient called and advised to go to [facility] ED, NOT to drive, have someone drive or call [phone]. Patient voiced understanding and agreeable to plan.

## 2022-10-03 DIAGNOSIS — I472 Ventricular tachycardia, unspecified: Secondary | ICD-10-CM | POA: Diagnosis not present

## 2022-10-03 LAB — BASIC METABOLIC PANEL
Anion gap: 9 (ref 5–15)
BUN: 33 mg/dL — ABNORMAL HIGH (ref 8–23)
CO2: 24 mmol/L (ref 22–32)
Calcium: 8.5 mg/dL — ABNORMAL LOW (ref 8.9–10.3)
Chloride: 104 mmol/L (ref 98–111)
Creatinine, Ser: 1.9 mg/dL — ABNORMAL HIGH (ref 0.44–1.00)
GFR, Estimated: 27 mL/min — ABNORMAL LOW (ref >60)
Glucose, Bld: 175 mg/dL — ABNORMAL HIGH (ref 70–99)
Potassium: 4.4 mmol/L (ref 3.5–5.1)
Sodium: 137 mmol/L (ref 135–145)

## 2022-10-03 LAB — C DIFFICILE QUICK SCREEN W PCR REFLEX
C Diff antigen: NEGATIVE
C Diff interpretation: NOT DETECTED
C Diff toxin: NEGATIVE

## 2022-10-03 LAB — CBC
HCT: 34.8 % — ABNORMAL LOW (ref 36.0–46.0)
Hemoglobin: 10.9 g/dL — ABNORMAL LOW (ref 12.0–15.0)
MCH: 28.2 pg (ref 26.0–34.0)
MCHC: 31.3 g/dL (ref 30.0–36.0)
MCV: 90.2 fL (ref 80.0–100.0)
Platelets: 189 10*3/uL (ref 150–400)
RBC: 3.86 MIL/uL — ABNORMAL LOW (ref 3.87–5.11)
RDW: 16.2 % — ABNORMAL HIGH (ref 11.5–15.5)
WBC: 6 10*3/uL (ref 4.0–10.5)
nRBC: 0 % (ref 0.0–0.2)

## 2022-10-03 LAB — GLUCOSE, CAPILLARY
Glucose-Capillary: 169 mg/dL — ABNORMAL HIGH (ref 70–99)
Glucose-Capillary: 210 mg/dL — ABNORMAL HIGH (ref 70–99)
Glucose-Capillary: 163 mg/dL — ABNORMAL HIGH (ref 70–99)
Glucose-Capillary: 204 mg/dL — ABNORMAL HIGH (ref 70–99)

## 2022-10-03 MED ORDER — ADULT MULTIVITAMIN W/MINERALS CH
1.0000 | ORAL_TABLET | Freq: Every day | ORAL | Status: DC
  Administered 2022-10-03 – 2022-10-06 (×4): 1 via ORAL
  Filled 2022-10-03 (×4): qty 1

## 2022-10-03 MED ORDER — CALCIUM CARBONATE ANTACID 500 MG PO CHEW
1.0000 | CHEWABLE_TABLET | Freq: Two times a day (BID) | ORAL | Status: DC
  Administered 2022-10-03 – 2022-10-06 (×6): 200 mg via ORAL
  Filled 2022-10-03 (×6): qty 1

## 2022-10-03 MED ORDER — GLUCERNA SHAKE PO LIQD
237.0000 mL | Freq: Three times a day (TID) | ORAL | Status: DC
  Administered 2022-10-03 – 2022-10-06 (×3): 237 mL via ORAL

## 2022-10-03 MED ORDER — ALUM & MAG HYDROXIDE-SIMETH 200-200-20 MG/5ML PO SUSP
30.0000 mL | Freq: Once | ORAL | Status: AC
  Administered 2022-10-03: 30 mL via ORAL
  Filled 2022-10-03: qty 30

## 2022-10-03 MED ORDER — ALUM & MAG HYDROXIDE-SIMETH 200-200-20 MG/5ML PO SUSP
30.0000 mL | ORAL | Status: DC | PRN
  Filled 2022-10-03: qty 30

## 2022-10-03 NOTE — Progress Notes (Signed)
PROGRESS NOTE    [name]  ZOX:[id number] DOB: 02/01/1946 DOA: 10/02/2022 PCP: Oneita Hurt, No     Brief Narrative:  [name] is a 77 y.o. female with medical history significant of afib on Coumadin, chronic systolic CHF with AICD, CAD, DM, HTN, HLD, and OSA presenting with an episode of VT lasting from 1127-1355 with HR 150.  She was started on Ranexa on 7/9 and developed intractable diarrhea on 7/10.  She was notified by the heart clinic that her ICD was detecting problems.  She sent a tracing with her device and it was still showing abnormal rhythm.  No symptoms.  This also happened a couple of weeks ago.  She was feeling a little weak.  She started taking Ranexa on on Tuesday night this week.  Wednesday about 0500, she awoke with diarrhea.  She had persistent diarrhea throughout the day Wednesday.  It got so bad that she put a diaper on.  She only took the one dose of Ranexa.  Diarrhea improved a little yesterday, only 1 stool today.  No sick contacts.  No n/v.   New events last 24 hours / Subjective: Had 2 episodes of watery diarrhea this morning.  Denies any abdominal cramping.  Assessment & Plan:   Principal Problem:   Ventricular tachycardia (HCC) Active Problems:   PAF (paroxysmal atrial fibrillation) (HCC)   HTN (hypertension)   Chronic systolic CHF (congestive heart failure) (HCC)   Hyperlipidemia   DM (diabetes mellitus), type 2 with renal complications (HCC)   Chronic kidney disease, stage 3b (HCC)   Acute kidney injury superimposed on chronic kidney disease (HCC)   Diarrhea   Seizure disorder (HCC)   Diarrhea -Diarrhea is not a known side effect of Ranexa.  Ranexa is on hold however -Check C. difficile, GI PCR panel  Recurrent V. tach -EP following -Amiodarone drip  AKI on CKD stage IIIb -Baseline creatinine 1.8 -Resolved, creatinine down to 1.9 today -Stop IV fluid today  Chronic systolic CHF -EF 25 to 30% -Without acute exacerbation -Stop IV  fluid today  A-fib -Remains on Coreg, amiodarone drip, Eliquis  Hypertension -Coreg -Losartan on hold due to AKI  Diabetes mellitus, uncontrolled with hyperglycemia -A1c 9.2 -Trulicity, Jardiance, Ozempic on hold -Semglee, sliding scale insulin  Hyperlipidemia -Crestor  CAD status post LAD and RCA PCI -Stable  Anxiety -BuSpar, sertraline  Seizure disorder -Keppra    DVT prophylaxis:   apixaban (ELIQUIS) tablet 5 mg  Code Status: Full code Family Communication: None at bedside Disposition Plan: Home Status is: Inpatient Remains inpatient appropriate because: Continue to monitor symptoms of diarrhea, remains on amiodarone drip    Antimicrobials:  Anti-infectives (From admission, onward)    None        Objective: Vitals:   10/03/22 0428 10/03/22 0837 10/03/22 0918 10/03/22 1009  BP: (!) 146/71 (!) 146/60    Pulse: (!) 57 (!) 55  61  Resp: 18 20    Temp: 98.5 F (36.9 C) 97.9 F (36.6 C)    TempSrc: Oral Oral    SpO2: 93% 97%    Weight:   87 kg   Height:        Intake/Output Summary (Last 24 hours) at 10/03/2022 1257 Last data filed at 10/03/2022 0600 Gross per 24 hour  Intake 845.48 ml  Output --  Net 845.48 ml   Filed Weights   10/03/22 0918  Weight: 87 kg    Examination:  General exam: Appears calm and comfortable  Respiratory system:  Clear to auscultation. Respiratory effort normal. No respiratory distress. No conversational dyspnea.  Cardiovascular system: S1 & S2 heard, RRR. No murmurs. No pedal edema. Gastrointestinal system: Abdomen is nondistended, soft and nontender. Normal bowel sounds heard. Central nervous system: Alert and oriented. No focal neurological deficits. Speech clear.  Extremities: Symmetric in appearance  Skin: No rashes, lesions or ulcers on exposed skin  Psychiatry: Judgement and insight appear normal. Mood & affect appropriate.   Data Reviewed: I have personally reviewed following labs and imaging  studies  CBC: Recent Labs  Lab 10/02/22 1118 10/03/22 0205  WBC 8.0 6.0  HGB 12.4 10.9*  HCT 40.6 34.8*  MCV 96.7 90.2  PLT 215 189   Basic Metabolic Panel: Recent Labs  Lab 10/02/22 1210 10/03/22 0205  NA 141 137  K 4.6 4.4  CL 102 104  CO2 21* 24  GLUCOSE 199* 175*  BUN 36* 33*  CREATININE 2.41* 1.90*  CALCIUM 9.2 8.5*  MG 2.5*  --    GFR: Estimated Creatinine Clearance: 26.7 mL/min (A) (by C-G formula based on SCr of 1.9 mg/dL (H)). Liver Function Tests: Recent Labs  Lab 10/02/22 1210  AST 36  ALT 45*  ALKPHOS 85  BILITOT 0.3  PROT 7.2  ALBUMIN 3.8   No results for input(s): "LIPASE", "AMYLASE" in the last 168 hours. No results for input(s): "AMMONIA" in the last 168 hours. Coagulation Profile: No results for input(s): "INR", "PROTIME" in the last 168 hours. Cardiac Enzymes: No results for input(s): "CKTOTAL", "CKMB", "CKMBINDEX", "TROPONINI" in the last 168 hours. BNP (last 3 results) No results for input(s): "PROBNP" in the last 8760 hours. HbA1C: Recent Labs    10/02/22 1554  HGBA1C 9.2*   CBG: Recent Labs  Lab 10/02/22 1715 10/02/22 2110 10/03/22 0835 10/03/22 1151  GLUCAP 171* 187* 169* 210*   Lipid Profile: No results for input(s): "CHOL", "HDL", "LDLCALC", "TRIG", "CHOLHDL", "LDLDIRECT" in the last 72 hours. Thyroid Function Tests: Recent Labs    10/02/22 1210  TSH 2.833  FREET4 1.19*   Anemia Panel: No results for input(s): "VITAMINB12", "FOLATE", "FERRITIN", "TIBC", "IRON", "RETICCTPCT" in the last 72 hours. Sepsis Labs: No results for input(s): "PROCALCITON", "LATICACIDVEN" in the last 168 hours.  Recent Results (from the past 240 hour(s))  C Difficile Quick Screen w PCR reflex     Status: None   Collection Time: 10/03/22  8:00 AM   Specimen: STOOL  Result Value Ref Range Status   C Diff antigen NEGATIVE NEGATIVE Final   C Diff toxin NEGATIVE NEGATIVE Final   C Diff interpretation No C. difficile detected.  Final     Comment: Performed at [facility], [address] [place], [place] [address]      Radiology Studies: No results found.    Scheduled Meds:  apixaban  5 mg Oral BID   busPIRone  5 mg Oral BID   calcium carbonate  1 tablet Oral BID WC   carvedilol  12.5 mg Oral BID WC   feeding supplement (GLUCERNA SHAKE)  237 mL Oral TID BM   gabapentin  200 mg Oral BID   insulin aspart  0-9 Units Subcutaneous TID WC   insulin glargine-yfgn  12 Units Subcutaneous QHS   levETIRAcetam  250 mg Oral BID   multivitamin with minerals  1 tablet Oral Daily   pantoprazole  40 mg Oral q morning   rosuvastatin  40 mg Oral Daily   sertraline  50 mg Oral QHS   sodium chloride  flush  3 mL Intravenous Q12H   Continuous Infusions:  amiodarone 30 mg/hr (10/03/22 0330)     LOS: 1 day   Time spent: 35 minutes   Noralee Stain, DO [facility] 10/03/2022, 12:57 PM   Available via Epic secure chat 7am-7pm After these hours, please refer to coverage provider listed on [email or web address]

## 2022-10-03 NOTE — Plan of Care (Signed)

## 2022-10-03 NOTE — Progress Notes (Signed)
   Rounding Note    Patient Name: JACKLEEN HOLLOWAY Date of Encounter: 10/03/2022  [facility] Cardiologist: [name], MD   Subjective   NAEO.  Vital Signs    Vitals:   10/02/22 2023 10/02/22 2325 10/03/22 0428 10/03/22 0837  BP: (!) 124/56 (!) 145/53 (!) 146/71 (!) 146/60  Pulse: 61 61 (!) 57 (!) 55  Resp: 20 16 18 20   Temp: 98.6 F (37 C) 98 F (36.7 C) 98.5 F (36.9 C) 97.9 F (36.6 C)  TempSrc: Oral Oral Oral Oral  SpO2: 97% 97% 93% 97%  Height:        Intake/Output Summary (Last 24 hours) at 10/03/2022 0839 Last data filed at 10/03/2022 0600 Gross per 24 hour  Intake 845.48 ml  Output --  Net 845.48 ml      07/21/2022   11:52 AM 07/15/2022    8:41 AM 06/23/2022    8:29 AM  Last 3 Weights  Weight (lbs) 186 lb 187 lb 6.4 oz 187 lb 9.6 oz  Weight (kg) 84.369 kg 85.004 kg 85.095 kg      Telemetry    Personally Reviewed  ECG     Personally Reviewed  Physical Exam   GEN: No acute distress.   Cardiac: RRR, no murmurs, rubs, or gallops.  Respiratory: Clear to auscultation bilaterally. Psych: Normal affect   Assessment & Plan    #VT Quiescent overnight. This episode possibly triggered by diarrheal illness and electrolyte derangements.  Hold ranexa for now Continue amiodarone IV through today. Plan to transition to oral tomorrow If recurrent, consider catheter ablation given slow CL and hemodynamic stability and ischemic substrate   #Diarrhea/Hypovolemia Possibly the trigger of her VT. Unclear if this is related to the Ranexa. Workup per IM team.   #Chronic systolic HF #CAD   #AF Continue eliquis    #ICM        Sheria Lang T. Lalla Brothers, MD, [facility], [facility] Cardiac Electrophysiology

## 2022-10-03 NOTE — Progress Notes (Addendum)
Initial Nutrition Assessment  DOCUMENTATION CODES:   Not applicable  INTERVENTION:   -Obtain new wt -Continue heart healthy, carb modified diet -MVI with minerals daily -Glucerna Shake po TID, each supplement provides 220 kcal and 10 grams of protein  -Provided "Heart Healthy, Consistent Carbohydrate Nutrition Therapy" handout from [facility] Nutrition Care Manual; attached to AVS/ discharge summary -Referred pt to [facility] for further support, education and reinforcement  NUTRITION DIAGNOSIS:   Increased nutrient needs related to chronic illness (CHF) as evidenced by estimated needs.  GOAL:   Patient will meet greater than or equal to 90% of their needs  MONITOR:   PO intake, Supplement acceptance  REASON FOR ASSESSMENT:   Consult Assessment of nutrition requirement/status  ASSESSMENT:   Pt with medical history significant of afib on Coumadin, chronic systolic CHF with AICD, CAD, DM, HTN, HLD, and OSA presenting with an episode of VT lasting from 1127-1355 with HR 150.  Pt admitted with recurrent ventricular tachycardia.   Reviewed I/O's: +846 ml since admission  Pt unavailable at time of visit. Attempted to speak with pt via call to hospital room phone, however, unable to reach. RD unable to obtain further nutrition-related history or complete nutrition-focused physical exam at this time.    Per cardiology notes, VT episodes likely related to diarrheal illness and electrolyte derangements.   Pt currently on a heart healthy, carb modified diet. No meal completion data available to assess at this time.   Per MD notes, diarrhea has improved since admission.   Reviewed wt hx; no current wt reading. Last wt reading on 07/21/22. RD will obtain new wt to better assess acute weight changes. Suspect some wt loss may be related to semaglutide use as an outpatient. Pt also with edema, which may be masking true weight loss as well as fat  and muscle depletions.   Medications reviewed and include neurontin, keppra, and lactated ringers @ 50 ml/hr.   Lab Results  Component Value Date   HGBA1C 9.2 (H) 10/02/2022   PTA DM medications are 0.5 mg semaglutide weekly, 12 units insulin degludec daily, 10 mg jardiance daily and 10-15 units insulin aspart TID with meals.   Labs reviewed: CBGS: 169 (inpatient orders for glycemic control are 0-9 units insulin aspart TID with meals and 12 units insulin glargine-yfgn daily at bedtime).    Diet Order:   Diet Order             Diet heart healthy/carb modified Room service appropriate? Yes; Fluid consistency: Thin  Diet effective now                   EDUCATION NEEDS:   No education needs have been identified at this time  Skin:  Skin Assessment: Reviewed RN Assessment  Last BM:  10/02/22 (type 7)  Height:   Ht Readings from Last 1 Encounters:  10/02/22 5\' 3"  (1.6 m)    Weight:   Wt Readings from Last 1 Encounters:  07/21/22 84.4 kg    Ideal Body Weight:  52.3 kg  BMI:  Body mass index is 32.95 kg/m.  Estimated Nutritional Needs:   Kcal:  1550-1750  Protein:  80-95 grams  Fluid:  > 1.5 L    Levada Schilling, RD, LDN, CDCES Registered Dietitian II Certified Diabetes Care and Education Specialist Please refer to [facility] for RD and/or RD on-call/weekend/after hours pager

## 2022-10-03 NOTE — Progress Notes (Signed)
OT Cancellation Note  Patient Details Name: ANABIA DOVALINA MRN: [id number] DOB: 05-30-1945   Cancelled Treatment:    Reason Eval/Treat Not Completed: OT screened, no needs identified, will sign off (Discussed with PT)  Litzi Binning,HILLARY 10/03/2022, 12:36 PM Luisa Dago, OT/L   Acute OT Clinical Specialist Acute Rehabilitation Services Pager [phone] Office [phone]

## 2022-10-03 NOTE — Discharge Instructions (Signed)

## 2022-10-03 NOTE — Evaluation (Signed)
Physical Therapy Evaluation Patient Details Name: [name] MRN: [id number] DOB: 1945-08-07 Today's Date: 10/03/2022  History of Present Illness  [name] is a 77 y.o. female admitted 7/12 presenting with an episode of VT lasting from 1127-1355 with HR 150.  She was recently started on Ranexa on 7/9 and developed intractable diarrhea on 7/10.  She was notified by the heart clinic that her ICD was detecting problems and to come to hosptial.   PMH:  afib on Coumadin, chronic systolic CHF with AICD, CAD, DM, HTN, HLD, and OSA  Clinical Impression  Pt admitted with above diagnosis. Pt was able to ambulate with RW with good safety awareness. Appears pt is close to baseline. Pt should progress well and go home with no f/u. Pt aware to use RW at home and she agrees.   Pt currently with functional limitations due to the deficits listed below (see PT Problem List). Pt will benefit from acute skilled PT to increase their independence and safety with mobility to allow discharge.           Assistance Recommended at Discharge PRN  If plan is discharge home, recommend the following:  Can travel by private vehicle  Help with stairs or ramp for entrance        Equipment Recommendations None recommended by PT  Recommendations for Other Services       Functional Status Assessment Patient has had a recent decline in their functional status and demonstrates the ability to make significant improvements in function in a reasonable and predictable amount of time.     Precautions / Restrictions Precautions Precautions: Fall Restrictions Weight Bearing Restrictions: No      Mobility  Bed Mobility Overal bed mobility: Independent                  Transfers Overall transfer level: Needs assistance Equipment used: Rolling walker (2 wheels) Transfers: Sit to/from Stand Sit to Stand: Supervision                Ambulation/Gait Ambulation/Gait assistance:  Supervision Gait Distance (Feet): 75 Feet Assistive device: Rolling walker (2 wheels) Gait Pattern/deviations: Step-through pattern, Decreased stride length   Gait velocity interpretation: <1.31 ft/sec, indicative of household ambulator   General Gait Details: Pt safe with RW and no LOB.  Pt reports she is close to baseline with gait.  Stairs            Wheelchair Mobility     Tilt Bed    Modified Rankin (Stroke Patients Only)       Balance Overall balance assessment: Needs assistance Sitting-balance support: No upper extremity supported, Feet supported Sitting balance-Leahy Scale: Fair     Standing balance support: Bilateral upper extremity supported, During functional activity Standing balance-Leahy Scale: Poor Standing balance comment: relies on UE support for balance                             Pertinent Vitals/Pain Pain Assessment Pain Assessment: No/denies pain    Home Living Family/patient expects to be discharged to:: Private residence Living Arrangements: Children Available Help at Discharge: Family;Available PRN/intermittently (son works, grandson is there 24 hours) Type of Home: House Home Access: Stairs to enter Entrance Stairs-Rails: Right;Left;Can reach both Secretary/administrator of Steps: 4 Alternate Level Stairs-Number of Steps: flight Home Layout: Two level;Bed/bath upstairs Home Equipment: Rollator (4 wheels)      Prior Function Prior Level of Function : Needs assist;History  of Falls (last six months)             Mobility Comments: sciatic nerve issues and knee pain ADLs Comments: Pt I with ADLs     Hand Dominance   Dominant Hand: Right    Extremity/Trunk Assessment   Upper Extremity Assessment Upper Extremity Assessment: Defer to OT evaluation    Lower Extremity Assessment Lower Extremity Assessment: Overall WFL for tasks assessed    Cervical / Trunk Assessment Cervical / Trunk Assessment: Normal   Communication   Communication: No difficulties  Cognition Arousal/Alertness: Awake/alert Behavior During Therapy: WFL for tasks assessed/performed Overall Cognitive Status: Within Functional Limits for tasks assessed                                          General Comments General comments (skin integrity, edema, etc.): VSS    Exercises     Assessment/Plan    PT Assessment Patient needs continued PT services  PT Problem List Decreased activity tolerance;Decreased balance;Decreased mobility;Decreased knowledge of use of DME;Decreased safety awareness;Decreased knowledge of precautions       PT Treatment Interventions DME instruction;Gait training;Functional mobility training;Therapeutic activities;Therapeutic exercise;Balance training;Patient/family education    PT Goals (Current goals can be found in the Care Plan section)  Acute Rehab PT Goals Patient Stated Goal: to go home PT Goal Formulation: With patient Time For Goal Achievement: 10/10/22 Potential to Achieve Goals: Good    Frequency Min 1X/week     Co-evaluation               AM-PAC PT "6 Clicks" Mobility  Outcome Measure Help needed turning from your back to your side while in a flat bed without using bedrails?: None Help needed moving from lying on your back to sitting on the side of a flat bed without using bedrails?: None Help needed moving to and from a bed to a chair (including a wheelchair)?: A Little Help needed standing up from a chair using your arms (e.g., wheelchair or bedside chair)?: A Little Help needed to walk in hospital room?: A Little Help needed climbing 3-5 steps with a railing? : A Little 6 Click Score: 20    End of Session Equipment Utilized During Treatment: Gait belt Activity Tolerance: Patient tolerated treatment well Patient left: in bed;with call bell/phone within reach;with bed alarm set Nurse Communication: Mobility status PT Visit Diagnosis: Muscle  weakness (generalized) (M62.81)    Time: 1610-9604 PT Time Calculation (min) (ACUTE ONLY): 15 min   Charges:   PT Evaluation $PT Eval Low Complexity: 1 Low   PT General Charges $$ ACUTE PT VISIT: 1 Visit         Zygmund Passero M,PT Acute Rehab Services [phone]   Bevelyn Buckles 10/03/2022, 2:45 PM

## 2022-10-04 DIAGNOSIS — I472 Ventricular tachycardia, unspecified: Secondary | ICD-10-CM | POA: Diagnosis not present

## 2022-10-04 LAB — GASTROINTESTINAL PANEL BY PCR, STOOL (REPLACES STOOL CULTURE)

## 2022-10-04 LAB — BASIC METABOLIC PANEL
Anion gap: 8 (ref 5–15)
BUN: 25 mg/dL — ABNORMAL HIGH (ref 8–23)
CO2: 25 mmol/L (ref 22–32)
Calcium: 8.4 mg/dL — ABNORMAL LOW (ref 8.9–10.3)
Chloride: 102 mmol/L (ref 98–111)
Creatinine, Ser: 1.6 mg/dL — ABNORMAL HIGH (ref 0.44–1.00)
GFR, Estimated: 33 mL/min — ABNORMAL LOW (ref >60)
Glucose, Bld: 177 mg/dL — ABNORMAL HIGH (ref 70–99)
Potassium: 4.1 mmol/L (ref 3.5–5.1)
Sodium: 135 mmol/L (ref 135–145)

## 2022-10-04 LAB — GLUCOSE, CAPILLARY
Glucose-Capillary: 185 mg/dL — ABNORMAL HIGH (ref 70–99)
Glucose-Capillary: 183 mg/dL — ABNORMAL HIGH (ref 70–99)
Glucose-Capillary: 223 mg/dL — ABNORMAL HIGH (ref 70–99)
Glucose-Capillary: 158 mg/dL — ABNORMAL HIGH (ref 70–99)

## 2022-10-04 LAB — MAGNESIUM: Magnesium: 2.1 mg/dL (ref 1.7–2.4)

## 2022-10-04 MED ORDER — DICYCLOMINE HCL 10 MG PO CAPS
10.0000 mg | ORAL_CAPSULE | Freq: Three times a day (TID) | ORAL | Status: DC
  Administered 2022-10-04 – 2022-10-06 (×9): 10 mg via ORAL
  Filled 2022-10-04 (×8): qty 1

## 2022-10-04 MED ORDER — LOSARTAN POTASSIUM 25 MG PO TABS
25.0000 mg | ORAL_TABLET | Freq: Every day | ORAL | Status: DC
  Administered 2022-10-04 – 2022-10-06 (×3): 25 mg via ORAL
  Filled 2022-10-04 (×3): qty 1

## 2022-10-04 MED ORDER — AMIODARONE HCL 200 MG PO TABS
200.0000 mg | ORAL_TABLET | Freq: Two times a day (BID) | ORAL | Status: DC
  Administered 2022-10-04 – 2022-10-06 (×5): 200 mg via ORAL
  Filled 2022-10-04 (×5): qty 1

## 2022-10-04 NOTE — Progress Notes (Signed)
   Rounding Note    Patient Name: [name] Date of Encounter: 10/04/2022  [facility] Cardiologist: [name], MD   Subjective   NAEO. Feels well today.  Vital Signs    Vitals:   10/03/22 2330 10/04/22 0000 10/04/22 0400 10/04/22 0443  BP: (!) 143/70   (!) 113/52  Pulse: 60   (!) 55  Resp: 18   16  Temp: 98 F (36.7 C)   98.1 F (36.7 C)  TempSrc: Oral   Oral  SpO2: 99% 99% 98% 93%  Weight:      Height:        Intake/Output Summary (Last 24 hours) at 10/04/2022 0959 Last data filed at 10/04/2022 0701 Gross per 24 hour  Intake 408.95 ml  Output --  Net 408.95 ml      10/03/2022    9:18 AM 07/21/2022   11:52 AM 07/15/2022    8:41 AM  Last 3 Weights  Weight (lbs) 191 lb 12.8 oz 186 lb 187 lb 6.4 oz  Weight (kg) 87 kg 84.369 kg 85.004 kg      Telemetry    Personally Reviewed  ECG     Personally Reviewed  Physical Exam   GEN: No acute distress.   Cardiac: RRR, no murmurs, rubs, or gallops.  Respiratory: Clear to auscultation bilaterally. Psych: Normal affect   Assessment & Plan    #VT Quiescent overnight. This episode possibly triggered by diarrheal illness and electrolyte derangements.  Hold ranexa for now Stop IV amiodarone Start PO amiodarone. If remains quiet through today/tonight, plan for discharge Monday. If recurrent, consider catheter ablation given slow CL and hemodynamic stability and ischemic substrate   #Diarrhea/Hypovolemia Possibly the trigger of her VT. Unclear if this is related to the Ranexa. Workup per IM team.   #Chronic systolic HF #CAD   #AF Continue eliquis    #ICM     Sheria Lang T. Lalla Brothers, MD, [facility], [facility] Cardiac Electrophysiology

## 2022-10-04 NOTE — Progress Notes (Signed)
PROGRESS NOTE    [name]  GEX:[id number] DOB: 06-Apr-1945 DOA: 10/02/2022 PCP: Oneita Hurt, No     Brief Narrative:  [name] is a 77 y.o. female with medical history significant of afib on Coumadin, chronic systolic CHF with AICD, CAD, DM, HTN, HLD, and OSA presenting with an episode of VT lasting from 1127-1355 with HR 150.  She was started on Ranexa on 7/9 and developed intractable diarrhea on 7/10.  She was notified by the heart clinic that her ICD was detecting problems.  She sent a tracing with her device and it was still showing abnormal rhythm.  No symptoms.  This also happened a couple of weeks ago.  She was feeling a little weak.  She started taking Ranexa on on Tuesday night this week.  Wednesday about 0500, she awoke with diarrhea.  She had persistent diarrhea throughout the day Wednesday.  It got so bad that she put a diaper on.  She only took the one dose of Ranexa.  Diarrhea improved a little yesterday, only 1 stool today.  No sick contacts.  No n/v.   New events last 24 hours / Subjective: Reports 4 bowel movements yesterday.  C. difficile and GI PCR are negative.  Assessment & Plan:   Principal Problem:   Ventricular tachycardia (HCC) Active Problems:   PAF (paroxysmal atrial fibrillation) (HCC)   HTN (hypertension)   Chronic systolic CHF (congestive heart failure) (HCC)   Hyperlipidemia   DM (diabetes mellitus), type 2 with renal complications (HCC)   Chronic kidney disease, stage 3b (HCC)   Acute kidney injury superimposed on chronic kidney disease (HCC)   Diarrhea   Seizure disorder (HCC)   Diarrhea -Diarrhea is not a known side effect of Ranexa.  Ranexa is on hold however -C. difficile, GI PCR panel negative -Improving  Recurrent V. tach -EP following -Amiodarone drip --> PO   AKI on CKD stage IIIb -Baseline creatinine 1.8 -Resolved  Chronic systolic CHF -EF 25 to 30% -Without acute exacerbation  A-fib -Remains on Coreg, amiodarone,  Eliquis  Hypertension -Coreg, losartan  Diabetes mellitus, uncontrolled with hyperglycemia -A1c 9.2 -Trulicity, Jardiance, Ozempic on hold -Semglee, sliding scale insulin  Hyperlipidemia -Crestor  CAD status post LAD and RCA PCI -Stable  Anxiety -BuSpar, sertraline  Seizure disorder -Keppra    DVT prophylaxis:   apixaban (ELIQUIS) tablet 5 mg  Code Status: Full code Family Communication: None at bedside Disposition Plan: Home Status is: Inpatient Remains inpatient appropriate because: Improving, likely discharge home 7/15    Antimicrobials:  Anti-infectives (From admission, onward)    None        Objective: Vitals:   10/04/22 0000 10/04/22 0400 10/04/22 0443 10/04/22 1216  BP:   (!) 113/52 (!) 128/53  Pulse:   (!) 55 60  Resp:   16 18  Temp:   98.1 F (36.7 C) 98.4 F (36.9 C)  TempSrc:   Oral Oral  SpO2: 99% 98% 93% 97%  Weight:      Height:        Intake/Output Summary (Last 24 hours) at 10/04/2022 1233 Last data filed at 10/04/2022 0701 Gross per 24 hour  Intake 312.81 ml  Output --  Net 312.81 ml   Filed Weights   10/03/22 0918  Weight: 87 kg    Examination:  General exam: Appears calm and comfortable  Respiratory system: Clear to auscultation. Respiratory effort normal. No respiratory distress. No conversational dyspnea.  Cardiovascular system: S1 & S2 heard, RRR. No murmurs.  No pedal edema. Gastrointestinal system: Abdomen is nondistended, soft and nontender. Normal bowel sounds heard. Central nervous system: Alert and oriented. No focal neurological deficits. Speech clear.  Extremities: Symmetric in appearance  Skin: No rashes, lesions or ulcers on exposed skin  Psychiatry: Judgement and insight appear normal. Mood & affect appropriate.   Data Reviewed: I have personally reviewed following labs and imaging studies  CBC: Recent Labs  Lab 10/02/22 1118 10/03/22 0205  WBC 8.0 6.0  HGB 12.4 10.9*  HCT 40.6 34.8*  MCV 96.7  90.2  PLT 215 189   Basic Metabolic Panel: Recent Labs  Lab 10/02/22 1210 10/03/22 0205 10/04/22 0138  NA 141 137 135  K 4.6 4.4 4.1  CL 102 104 102  CO2 21* 24 25  GLUCOSE 199* 175* 177*  BUN 36* 33* 25*  CREATININE 2.41* 1.90* 1.60*  CALCIUM 9.2 8.5* 8.4*  MG 2.5*  --  2.1   GFR: Estimated Creatinine Clearance: 31.7 mL/min (A) (by C-G formula based on SCr of 1.6 mg/dL (H)). Liver Function Tests: Recent Labs  Lab 10/02/22 1210  AST 36  ALT 45*  ALKPHOS 85  BILITOT 0.3  PROT 7.2  ALBUMIN 3.8   No results for input(s): "LIPASE", "AMYLASE" in the last 168 hours. No results for input(s): "AMMONIA" in the last 168 hours. Coagulation Profile: No results for input(s): "INR", "PROTIME" in the last 168 hours. Cardiac Enzymes: No results for input(s): "CKTOTAL", "CKMB", "CKMBINDEX", "TROPONINI" in the last 168 hours. BNP (last 3 results) No results for input(s): "PROBNP" in the last 8760 hours. HbA1C: Recent Labs    10/02/22 1554  HGBA1C 9.2*   CBG: Recent Labs  Lab 10/03/22 1151 10/03/22 1700 10/03/22 2058 10/04/22 0824 10/04/22 1211  GLUCAP 210* 163* 204* 185* 183*   Lipid Profile: No results for input(s): "CHOL", "HDL", "LDLCALC", "TRIG", "CHOLHDL", "LDLDIRECT" in the last 72 hours. Thyroid Function Tests: Recent Labs    10/02/22 1210  TSH 2.833  FREET4 1.19*   Anemia Panel: No results for input(s): "VITAMINB12", "FOLATE", "FERRITIN", "TIBC", "IRON", "RETICCTPCT" in the last 72 hours. Sepsis Labs: No results for input(s): "PROCALCITON", "LATICACIDVEN" in the last 168 hours.  Recent Results (from the past 240 hour(s))  C Difficile Quick Screen w PCR reflex     Status: None   Collection Time: 10/03/22  8:00 AM   Specimen: STOOL  Result Value Ref Range Status   C Diff antigen NEGATIVE NEGATIVE Final   C Diff toxin NEGATIVE NEGATIVE Final   C Diff interpretation No C. difficile detected.  Final    Comment: Performed at [facility], [address] [place], [place] [address]  Gastrointestinal Panel by PCR , Stool     Status: None   Collection Time: 10/03/22  8:00 AM   Specimen: STOOL  Result Value Ref Range Status   Campylobacter species NOT DETECTED NOT DETECTED Final   Plesimonas shigelloides NOT DETECTED NOT DETECTED Final   Salmonella species NOT DETECTED NOT DETECTED Final   Yersinia enterocolitica NOT DETECTED NOT DETECTED Final   Vibrio species NOT DETECTED NOT DETECTED Final   Vibrio cholerae NOT DETECTED NOT DETECTED Final   Enteroaggregative E coli (EAEC) NOT DETECTED NOT DETECTED Final   Enteropathogenic E coli (EPEC) NOT DETECTED NOT DETECTED Final   Enterotoxigenic E coli (ETEC) NOT DETECTED NOT DETECTED Final   Shiga like toxin producing E coli (STEC) NOT DETECTED NOT DETECTED Final   Shigella/Enteroinvasive E coli (EIEC) NOT DETECTED NOT DETECTED Final  Cryptosporidium NOT DETECTED NOT DETECTED Final   Cyclospora cayetanensis NOT DETECTED NOT DETECTED Final   Entamoeba histolytica NOT DETECTED NOT DETECTED Final   Giardia lamblia NOT DETECTED NOT DETECTED Final   Adenovirus F40/41 NOT DETECTED NOT DETECTED Final   Astrovirus NOT DETECTED NOT DETECTED Final   Norovirus GI/GII NOT DETECTED NOT DETECTED Final   Rotavirus A NOT DETECTED NOT DETECTED Final   Sapovirus (I, II, IV, and V) NOT DETECTED NOT DETECTED Final    Comment: Performed at [facility], [address], [place], [place] [address]      Radiology Studies: No results found.    Scheduled Meds:  amiodarone  200 mg Oral BID   apixaban  5 mg Oral BID   busPIRone  5 mg Oral BID   calcium carbonate  1 tablet Oral BID WC   carvedilol  12.5 mg Oral BID WC   feeding supplement (GLUCERNA SHAKE)  237 mL Oral TID BM   gabapentin  200 mg Oral BID   insulin aspart  0-9 Units Subcutaneous TID WC   insulin glargine-yfgn  12 Units Subcutaneous QHS   levETIRAcetam  250 mg Oral BID   multivitamin with minerals  1 tablet Oral Daily    pantoprazole  40 mg Oral q morning   rosuvastatin  40 mg Oral Daily   sertraline  50 mg Oral QHS   sodium chloride flush  3 mL Intravenous Q12H   Continuous Infusions:     LOS: 2 days   Time spent: 25 minutes   Noralee Stain, DO [facility] 10/04/2022, 12:33 PM   Available via Epic secure chat 7am-7pm After these hours, please refer to coverage provider listed on [email or web address]

## 2022-10-04 NOTE — Plan of Care (Signed)

## 2022-10-05 DIAGNOSIS — I472 Ventricular tachycardia, unspecified: Secondary | ICD-10-CM | POA: Diagnosis not present

## 2022-10-05 LAB — BASIC METABOLIC PANEL
Anion gap: 6 (ref 5–15)
BUN: 23 mg/dL (ref 8–23)
CO2: 25 mmol/L (ref 22–32)
Calcium: 8.1 mg/dL — ABNORMAL LOW (ref 8.9–10.3)
Chloride: 102 mmol/L (ref 98–111)
Creatinine, Ser: 1.72 mg/dL — ABNORMAL HIGH (ref 0.44–1.00)
GFR, Estimated: 31 mL/min — ABNORMAL LOW (ref >60)
Glucose, Bld: 148 mg/dL — ABNORMAL HIGH (ref 70–99)
Potassium: 4.3 mmol/L (ref 3.5–5.1)
Sodium: 133 mmol/L — ABNORMAL LOW (ref 135–145)

## 2022-10-05 LAB — GLUCOSE, CAPILLARY
Glucose-Capillary: 143 mg/dL — ABNORMAL HIGH (ref 70–99)
Glucose-Capillary: 216 mg/dL — ABNORMAL HIGH (ref 70–99)
Glucose-Capillary: 114 mg/dL — ABNORMAL HIGH (ref 70–99)
Glucose-Capillary: 204 mg/dL — ABNORMAL HIGH (ref 70–99)

## 2022-10-05 MED ORDER — LOPERAMIDE HCL 2 MG PO CAPS
2.0000 mg | ORAL_CAPSULE | ORAL | Status: DC | PRN
  Administered 2022-10-05: 2 mg via ORAL
  Filled 2022-10-05 (×2): qty 1

## 2022-10-05 MED ORDER — ALUM & MAG HYDROXIDE-SIMETH 200-200-20 MG/5ML PO SUSP
15.0000 mL | ORAL | Status: DC | PRN
  Administered 2022-10-05: 15 mL via ORAL
  Filled 2022-10-05: qty 30

## 2022-10-05 NOTE — Plan of Care (Signed)

## 2022-10-05 NOTE — Progress Notes (Signed)
PROGRESS NOTE    [name]  ZOX:[id number] DOB: [birth year] DOA: 10/02/2022 PCP: Oneita Hurt, No     Brief Narrative:  [name] is a 77 y.o. female with medical history significant of afib on Coumadin, chronic systolic CHF with AICD, CAD, DM, HTN, HLD, and OSA presenting with an episode of VT lasting from 1127-1355 with HR 150.  She was started on Ranexa on 7/9 and developed intractable diarrhea on 7/10.  She was notified by the heart clinic that her ICD was detecting problems.  She sent a tracing with her device and it was still showing abnormal rhythm.  No symptoms.  This also happened a couple of weeks ago.  She was feeling a little weak.  She started taking Ranexa on on Tuesday night this week.  Wednesday about 0500, she awoke with diarrhea.  She had persistent diarrhea throughout the day Wednesday.  It got so bad that she put a diaper on.  She only took the one dose of Ranexa.  Diarrhea improved a little yesterday, only 1 stool today.  No sick contacts.  No n/v.   New events last 24 hours / Subjective: Only 1 bowel movement yesterday, more formed.  Eating breakfast this morning.  No complaints. Assessment & Plan:   Principal Problem:   Ventricular tachycardia (HCC) Active Problems:   PAF (paroxysmal atrial fibrillation) (HCC)   HTN (hypertension)   Chronic systolic CHF (congestive heart failure) (HCC)   Hyperlipidemia   DM (diabetes mellitus), type 2 with renal complications (HCC)   Chronic kidney disease, stage 3b (HCC)   Acute kidney injury superimposed on chronic kidney disease (HCC)   Diarrhea   Seizure disorder (HCC)   Diarrhea -Diarrhea is not a known side effect of Ranexa.  Ranexa is on hold however -C. difficile, GI PCR panel negative -Improving  Recurrent V. tach -EP following -Amiodarone drip --> PO   AKI on CKD stage IIIb -Baseline creatinine 1.8 -Resolved  Chronic systolic CHF -EF 25 to 30% -Without acute exacerbation  A-fib -Remains on Coreg,  amiodarone, Eliquis  Hypertension -Coreg, losartan  Diabetes mellitus, uncontrolled with hyperglycemia -A1c 9.2 -Trulicity, Jardiance, Ozempic on hold -Semglee, sliding scale insulin  Hyperlipidemia -Crestor  CAD status post LAD and RCA PCI -Stable  Anxiety -BuSpar, sertraline  Seizure disorder -Keppra    DVT prophylaxis:   apixaban (ELIQUIS) tablet 5 mg  Code Status: Full code Family Communication: None at bedside Disposition Plan: Home Status is: Inpatient Remains inpatient appropriate because: Improving, likely discharge home 7/16, once cleared from cardiology service    Antimicrobials:  Anti-infectives (From admission, onward)    None        Objective: Vitals:   10/05/22 0000 10/05/22 0117 10/05/22 0421 10/05/22 0817  BP:  (!) 120/55 (!) 130/52 (!) 153/56  Pulse:  (!) 56 60 60  Resp:  16 16   Temp:  98.2 F (36.8 C) 98.4 F (36.9 C)   TempSrc:  Oral Oral   SpO2: 97% 96% 99%   Weight:      Height:        Intake/Output Summary (Last 24 hours) at 10/05/2022 1254 Last data filed at 10/04/2022 1721 Gross per 24 hour  Intake 21.46 ml  Output --  Net 21.46 ml   Filed Weights   10/03/22 0918  Weight: 87 kg    Examination:  General exam: Appears calm and comfortable  Respiratory system: Clear to auscultation. Respiratory effort normal. No respiratory distress. No conversational dyspnea.  Cardiovascular system:  S1 & S2 heard, RRR. No murmurs. No pedal edema. Gastrointestinal system: Abdomen is nondistended, soft and nontender. Normal bowel sounds heard. Central nervous system: Alert and oriented. No focal neurological deficits. Speech clear.  Extremities: Symmetric in appearance  Skin: No rashes, lesions or ulcers on exposed skin  Psychiatry: Judgement and insight appear normal. Mood & affect appropriate.   Data Reviewed: I have personally reviewed following labs and imaging studies  CBC: Recent Labs  Lab 10/02/22 1118 10/03/22 0205  WBC  8.0 6.0  HGB 12.4 10.9*  HCT 40.6 34.8*  MCV 96.7 90.2  PLT 215 189   Basic Metabolic Panel: Recent Labs  Lab 10/02/22 1210 10/03/22 0205 10/04/22 0138 10/05/22 0313  NA 141 137 135 133*  K 4.6 4.4 4.1 4.3  CL 102 104 102 102  CO2 21* 24 25 25   GLUCOSE 199* 175* 177* 148*  BUN 36* 33* 25* 23  CREATININE 2.41* 1.90* 1.60* 1.72*  CALCIUM 9.2 8.5* 8.4* 8.1*  MG 2.5*  --  2.1  --    GFR: Estimated Creatinine Clearance: 29.5 mL/min (A) (by C-G formula based on SCr of 1.72 mg/dL (H)). Liver Function Tests: Recent Labs  Lab 10/02/22 1210  AST 36  ALT 45*  ALKPHOS 85  BILITOT 0.3  PROT 7.2  ALBUMIN 3.8   No results for input(s): "LIPASE", "AMYLASE" in the last 168 hours. No results for input(s): "AMMONIA" in the last 168 hours. Coagulation Profile: No results for input(s): "INR", "PROTIME" in the last 168 hours. Cardiac Enzymes: No results for input(s): "CKTOTAL", "CKMB", "CKMBINDEX", "TROPONINI" in the last 168 hours. BNP (last 3 results) No results for input(s): "PROBNP" in the last 8760 hours. HbA1C: Recent Labs    10/02/22 1554  HGBA1C 9.2*   CBG: Recent Labs  Lab 10/04/22 1211 10/04/22 1614 10/04/22 2245 10/05/22 0726 10/05/22 1137  GLUCAP 183* 223* 158* 143* 216*   Lipid Profile: No results for input(s): "CHOL", "HDL", "LDLCALC", "TRIG", "CHOLHDL", "LDLDIRECT" in the last 72 hours. Thyroid Function Tests: No results for input(s): "TSH", "T4TOTAL", "FREET4", "T3FREE", "THYROIDAB" in the last 72 hours.  Anemia Panel: No results for input(s): "VITAMINB12", "FOLATE", "FERRITIN", "TIBC", "IRON", "RETICCTPCT" in the last 72 hours. Sepsis Labs: No results for input(s): "PROCALCITON", "LATICACIDVEN" in the last 168 hours.  Recent Results (from the past 240 hour(s))  C Difficile Quick Screen w PCR reflex     Status: None   Collection Time: 10/03/22  8:00 AM   Specimen: STOOL  Result Value Ref Range Status   C Diff antigen NEGATIVE NEGATIVE Final   C  Diff toxin NEGATIVE NEGATIVE Final   C Diff interpretation No C. difficile detected.  Final    Comment: Performed at [facility], [address] [place], [place] [address]  Gastrointestinal Panel by PCR , Stool     Status: None   Collection Time: 10/03/22  8:00 AM   Specimen: STOOL  Result Value Ref Range Status   Campylobacter species NOT DETECTED NOT DETECTED Final   Plesimonas shigelloides NOT DETECTED NOT DETECTED Final   Salmonella species NOT DETECTED NOT DETECTED Final   Yersinia enterocolitica NOT DETECTED NOT DETECTED Final   Vibrio species NOT DETECTED NOT DETECTED Final   Vibrio cholerae NOT DETECTED NOT DETECTED Final   Enteroaggregative E coli (EAEC) NOT DETECTED NOT DETECTED Final   Enteropathogenic E coli (EPEC) NOT DETECTED NOT DETECTED Final   Enterotoxigenic E coli (ETEC) NOT DETECTED NOT DETECTED Final   Shiga like toxin  producing E coli (STEC) NOT DETECTED NOT DETECTED Final   Shigella/Enteroinvasive E coli (EIEC) NOT DETECTED NOT DETECTED Final   Cryptosporidium NOT DETECTED NOT DETECTED Final   Cyclospora cayetanensis NOT DETECTED NOT DETECTED Final   Entamoeba histolytica NOT DETECTED NOT DETECTED Final   Giardia lamblia NOT DETECTED NOT DETECTED Final   Adenovirus F40/41 NOT DETECTED NOT DETECTED Final   Astrovirus NOT DETECTED NOT DETECTED Final   Norovirus GI/GII NOT DETECTED NOT DETECTED Final   Rotavirus A NOT DETECTED NOT DETECTED Final   Sapovirus (I, II, IV, and V) NOT DETECTED NOT DETECTED Final    Comment: Performed at [facility], [address], [place], [place] [address]      Radiology Studies: No results found.    Scheduled Meds:  amiodarone  200 mg Oral BID   apixaban  5 mg Oral BID   busPIRone  5 mg Oral BID   calcium carbonate  1 tablet Oral BID WC   carvedilol  12.5 mg Oral BID WC   dicyclomine  10 mg Oral TID AC & HS   feeding supplement (GLUCERNA SHAKE)  237 mL Oral TID BM   gabapentin  200 mg Oral BID    insulin aspart  0-9 Units Subcutaneous TID WC   insulin glargine-yfgn  12 Units Subcutaneous QHS   levETIRAcetam  250 mg Oral BID   losartan  25 mg Oral Daily   multivitamin with minerals  1 tablet Oral Daily   pantoprazole  40 mg Oral q morning   rosuvastatin  40 mg Oral Daily   sertraline  50 mg Oral QHS   sodium chloride flush  3 mL Intravenous Q12H   Continuous Infusions:     LOS: 3 days   Time spent: 25 minutes   Noralee Stain, DO [facility] 10/05/2022, 12:54 PM   Available via Epic secure chat 7am-7pm After these hours, please refer to coverage provider listed on [email or web address]

## 2022-10-05 NOTE — Progress Notes (Addendum)
   Rounding Note    Patient Name: [name] Date of Encounter: 10/05/2022  [facility] Cardiologist: [name] Ancona, MD   Subjective   Feels better  Vital Signs    Vitals:   10/04/22 2239 10/05/22 0000 10/05/22 0117 10/05/22 0421  BP: (!) 139/49  (!) 120/55 (!) 130/52  Pulse:   (!) 56 60  Resp: 16  16 16   Temp: 98.8 F (37.1 C)  98.2 F (36.8 C) 98.4 F (36.9 C)  TempSrc: Oral  Oral Oral  SpO2: 96% 97% 96% 99%  Weight:      Height:        Intake/Output Summary (Last 24 hours) at 10/05/2022 0745 Last data filed at 10/04/2022 1721 Gross per 24 hour  Intake 527.65 ml  Output --  Net 527.65 ml      10/03/2022    9:18 AM 07/21/2022   11:52 AM 07/15/2022    8:41 AM  Last 3 Weights  Weight (lbs) 191 lb 12.8 oz 186 lb 187 lb 6.4 oz  Weight (kg) 87 kg 84.369 kg 85.004 kg      Telemetry    AFib, V pacing, some AV pacing/tracking, no VT Personally Reviewed  ECG    No new EKgs  Physical Exam   Seen and examined by [name] this AM GEN: No acute distress.   Cardiac: RRR, no murmurs, rubs, or gallops.  Respiratory: Clear to auscultation bilaterally. Psych: Normal affect   Assessment & Plan    #VT Slow with rates 110's-120's Remains quiescent intermittent VPacing, some tracking, no VT This episode possibly triggered by diarrheal illness and electrolyte derangements.  Hold ranexa for now, she is certain this is the trigger/cause for her diarrhea Transitioned to PO amiodarone, yesterday.  Stay today, if no VT the next 24 hours plan discharge If more, [name] consider ablation, NPO after MN    #Diarrhea/Hypovolemia Possibly the trigger of her VT. Unclear if this is related to the Ranexa. Workup per IM team. Pt reports a more normal BM yesterday   #Chronic systolic HF #CAD # ICM No CP Appears euvolemic   #AF CHA2DS2Vasc is 9 Continue eliquis     [name] has seen the patient this AM  I have seen and examined this patient with  [name].  Agree with above, note added to reflect my findings.  Well.  No acute complaints.  Transition to p.o. amiodarone yesterday.  GEN: Well nourished, well developed, in no acute distress  HEENT: normal  Neck: no JVD, carotid bruits, or masses Cardiac: RRR; no murmurs, rubs, or gallops,no edema  Respiratory:  clear to auscultation bilaterally, normal work of breathing GI: soft, nontender, nondistended, + BS MS: no deformity or atrophy  Skin: warm and dry, device site well healed Neuro:  Strength and sensation are intact Psych: euthymic mood, full affect   Ventricular tachycardia: Has had slow rates.  Potentially triggered by a diarrheal illness.  Currently on p.o. amiodarone after IV load.  If she remains without VT, potential discharge home tomorrow. Diarrhea/hypovolemia: Patient has a trigger for VT.  She feels this is related to Ranexa.  Further workup per primary team. Chronic systolic heart failure: Appears euvolemic Coronary artery disease: No current chest pain Atrial fibrillation: Continue Eliquis  Larance Ratledge M. Jasmane Brockway MD 10/05/2022 9:28 AM

## 2022-10-06 ENCOUNTER — Ambulatory Visit: Payer: Medicare HMO | Attending: Student | Admitting: Student

## 2022-10-06 ENCOUNTER — Encounter: Payer: Self-pay | Admitting: Student

## 2022-10-06 ENCOUNTER — Other Ambulatory Visit (HOSPITAL_COMMUNITY): Payer: Self-pay

## 2022-10-06 DIAGNOSIS — I472 Ventricular tachycardia, unspecified: Secondary | ICD-10-CM | POA: Diagnosis not present

## 2022-10-06 LAB — BASIC METABOLIC PANEL
Anion gap: 6 (ref 5–15)
BUN: 22 mg/dL (ref 8–23)
CO2: 24 mmol/L (ref 22–32)
Calcium: 8.3 mg/dL — ABNORMAL LOW (ref 8.9–10.3)
Chloride: 104 mmol/L (ref 98–111)
Creatinine, Ser: 1.41 mg/dL — ABNORMAL HIGH (ref 0.44–1.00)
GFR, Estimated: 39 mL/min — ABNORMAL LOW (ref >60)
Glucose, Bld: 180 mg/dL — ABNORMAL HIGH (ref 70–99)
Potassium: 4.9 mmol/L (ref 3.5–5.1)
Sodium: 134 mmol/L — ABNORMAL LOW (ref 135–145)

## 2022-10-06 LAB — GLUCOSE, CAPILLARY
Glucose-Capillary: 120 mg/dL — ABNORMAL HIGH (ref 70–99)
Glucose-Capillary: 240 mg/dL — ABNORMAL HIGH (ref 70–99)

## 2022-10-06 MED ORDER — CARVEDILOL 12.5 MG PO TABS
12.5000 mg | ORAL_TABLET | Freq: Two times a day (BID) | ORAL | 1 refills | Status: DC
  Filled 2022-10-06: qty 60, 30d supply, fill #0

## 2022-10-06 MED ORDER — AMIODARONE HCL 200 MG PO TABS
200.0000 mg | ORAL_TABLET | Freq: Two times a day (BID) | ORAL | 0 refills | Status: DC
  Filled 2022-10-06: qty 60, 30d supply, fill #0

## 2022-10-06 MED ORDER — LOPERAMIDE HCL 2 MG PO CAPS
2.0000 mg | ORAL_CAPSULE | ORAL | 0 refills | Status: DC | PRN
  Filled 2022-10-06: qty 30, 30d supply, fill #0

## 2022-10-06 NOTE — Progress Notes (Signed)
Granddaughter notified of discharge

## 2022-10-06 NOTE — Progress Notes (Signed)
   Rounding Note    Patient Name: [name] Date of Encounter: 10/06/2022  [facility] Cardiologist: [name], MD   Subjective   No further ventricular arrhythmias overnight  Vital Signs    Vitals:   10/05/22 1940 10/05/22 2000 10/06/22 0000 10/06/22 0400  BP: (!) 134/53   (!) 126/42  Pulse: 60   (!) 57  Resp: 16   18  Temp: 98.7 F (37.1 C)   97.9 F (36.6 C)  TempSrc: Oral   Oral  SpO2: 90% 92% 96% 95%  Weight:      Height:       No intake or output data in the 24 hours ending 10/06/22 0730     10/03/2022    9:18 AM 07/21/2022   11:52 AM 07/15/2022    8:41 AM  Last 3 Weights  Weight (lbs) 191 lb 12.8 oz 186 lb 187 lb 6.4 oz  Weight (kg) 87 kg 84.369 kg 85.004 kg      Telemetry    Sinus rhythm with atrial and ventricular pacing-personally reviewed  ECG    None new  Physical Exam   GEN: Well nourished, well developed, in no acute distress  HEENT: normal  Neck: no JVD, carotid bruits, or masses Cardiac: RRR; no murmurs, rubs, or gallops,no edema  Respiratory:  clear to auscultation bilaterally, normal work of breathing GI: soft, nontender, nondistended, + BS MS: no deformity or atrophy  Skin: warm and dry, device site well healed Neuro:  Strength and sensation are intact Psych: euthymic mood, full affect   Assessment & Plan    1.  Ventricular tachycardia: No further episodes over the last 48 hours.  Currently on p.o. amiodarone.  Aiken Withem continue to 100 mg twice daily for 1 month followed by 200 mg daily.  2.  Diarrhea/hypovolemia: Potentially the trigger for VT.  Diarrhea has improved.  Plan per internal medicine.  3.  Chronic systolic heart failure 4.  Coronary artery disease  No obvious volume overload.  No current chest pain.  Continue with current management.  5.  Atrial fibrillation: Continue Eliquis.  [facility] Damin Salido sign off.   Medication Recommendations: Amiodarone 200 mg twice daily for 1 month then 200 mg  daily Other recommendations (labs, testing, etc): None Follow up as an outpatient: To be arranged in EP clinic   Yuliya Nova M. Pellegrino Kennard MD 10/06/2022 7:30 AM

## 2022-10-06 NOTE — Care Management Important Message (Signed)
Important Message  Patient Details  Name: [name] MRN: [id number] Date of Birth: 09-21-1945   Medicare Important Message Given:  Yes     Renie Ora 10/06/2022, 8:39 AM

## 2022-10-06 NOTE — Progress Notes (Signed)
Transition of Care [facility]) - Inpatient Brief Assessment   Patient Details  Name: LAFONDA PATRON MRN: [id number] Date of Birth: Jun 07, 1945  Transition of [facility]) CM/SW Contact:    Janae Bridgeman, RN Phone Number: 10/06/2022, 10:38 AM   Clinical Narrative: CM met with the patient at the bedside to discuss TOC needs.  No HH services are needed.  The patient plans to follow up with PCP - patient currently sees NP at [facility] in [facility]  No TOC needs.  Patient has family/friend to provide transportation to home.   Transition of Care Asessment: Insurance and Status: (P) Insurance coverage has been reviewed Patient has primary care physician: (P) Yes Home environment has been reviewed: (P) Yes Prior level of function:: (P) Independent - lives with her Adult son at the home Prior/Current Home Services: (P) No current home services Social Determinants of Health Reivew: (P) SDOH reviewed no interventions necessary Readmission risk has been reviewed: (P) Yes Transition of care needs: (P) no transition of care needs at this time

## 2022-10-06 NOTE — Progress Notes (Addendum)
Physical Therapy Treatment Patient Details Name: [name] MRN: [id number] DOB: 25-Jul-1945 Today's Date: 10/06/2022   History of Present Illness [name] is a 77 y.o. female admitted 7/12 presenting with an episode of VT lasting from 1127-1355 with HR 150.  She was recently started on Ranexa on 7/9 and developed intractable diarrhea on 7/10.  She was notified by the heart clinic that her ICD was detecting problems and to come to hosptial.   PMH:  afib on Coumadin, chronic systolic CHF with AICD, CAD, DM, HTN, HLD, and OSA    PT Comments  Pt eager to d/c home today. PT goal for session was activity progression and stair training. Pt ambulating further distance with RW this date vs previously, pt reporting still limited but due to R sciatic pain. Pt proficiently navigated x4 steps with increased time but no physical assist, declined performing more stairs because of sciatic pain. Pt states she will have assist of son or grandson once d/c home.                                                        HR 70s-90s throughout  Assistance Recommended at Discharge PRN  If plan is discharge home, recommend the following:  Can travel by private vehicle    Help with stairs or ramp for entrance      Equipment Recommendations  None recommended by PT    Recommendations for Other Services       Precautions / Restrictions Precautions Precautions: Fall Restrictions Weight Bearing Restrictions: No     Mobility  Bed Mobility Overal bed mobility: Needs Assistance Bed Mobility: Supine to Sit     Supine to sit: HOB elevated, Modified independent (Device/Increase time)     General bed mobility comments: use of HOB elevation and bedrails    Transfers Overall transfer level: Needs assistance Equipment used: Rolling walker (2 wheels) Transfers: Sit to/from Stand Sit to Stand: Supervision           General transfer comment: safety, slow to rise     Ambulation/Gait Ambulation/Gait assistance: Supervision Gait Distance (Feet): 110 Feet Assistive device: Rolling walker (2 wheels) Gait Pattern/deviations: Step-through pattern, Decreased stride length Gait velocity: decr     General Gait Details: for safety, cues for proximity to RW   Stairs Stairs: Yes Stairs assistance: Supervision Stair Management: One rail Right, Step to pattern, Forwards Number of Stairs: 4 General stair comments: for safety, cues for sequencing given RLE pain   Wheelchair Mobility     Tilt Bed    Modified Rankin (Stroke Patients Only)       Balance Overall balance assessment: Needs assistance Sitting-balance support: No upper extremity supported, Feet supported Sitting balance-Leahy Scale: Fair     Standing balance support: Bilateral upper extremity supported, During functional activity Standing balance-Leahy Scale: Poor Standing balance comment: relies on UE support for balance                            Cognition Arousal/Alertness: Awake/alert Behavior During Therapy: WFL for tasks assessed/performed Overall Cognitive Status: Within Functional Limits for tasks assessed  Exercises      General Comments        Pertinent Vitals/Pain Pain Assessment Pain Assessment: Faces Faces Pain Scale: Hurts little more Pain Location: R buttocks and LE given sciatic pain    Home Living                          Prior Function            PT Goals (current goals can now be found in the care plan section) Acute Rehab PT Goals Patient Stated Goal: to go home PT Goal Formulation: With patient Time For Goal Achievement: 10/10/22 Potential to Achieve Goals: Good Progress towards PT goals: Progressing toward goals    Frequency    Min 1X/week      PT Plan Current plan remains appropriate    Co-evaluation              AM-PAC PT "6 Clicks"  Mobility   Outcome Measure  Help needed turning from your back to your side while in a flat bed without using bedrails?: None Help needed moving from lying on your back to sitting on the side of a flat bed without using bedrails?: None Help needed moving to and from a bed to a chair (including a wheelchair)?: None Help needed standing up from a chair using your arms (e.g., wheelchair or bedside chair)?: A Little Help needed to walk in hospital room?: A Little Help needed climbing 3-5 steps with a railing? : A Little 6 Click Score: 21    End of Session   Activity Tolerance: Patient tolerated treatment well Patient left: in bed;with call bell/phone within reach;with bed alarm set Nurse Communication: Mobility status PT Visit Diagnosis: Muscle weakness (generalized) (M62.81)     Time: 9604-5409 PT Time Calculation (min) (ACUTE ONLY): 16 min  Charges:    $Therapeutic Activity: 8-22 mins PT General Charges $$ ACUTE PT VISIT: 1 Visit                     Marye Round, PT DPT Acute Rehabilitation Services Secure Chat Preferred  Office [phone]    [employer] Sheliah Plane 10/06/2022, 12:53 PM

## 2022-10-06 NOTE — Inpatient Diabetes Management (Signed)
Inpatient Diabetes Program Recommendations  AACE/ADA: New Consensus Statement on Inpatient Glycemic Control   Target Ranges:  Prepandial:   less than 140 mg/dL      Peak postprandial:   less than 180 mg/dL (1-2 hours)      Critically ill patients:  140 - 180 mg/dL    Latest Reference Range & Units 10/05/22 07:26 10/05/22 11:37 10/05/22 16:25 10/05/22 21:40 10/06/22 07:56  Glucose-Capillary 70 - 99 mg/dL 829 (H) 562 (H) 130 (H) 204 (H) 120 (H)    Latest Reference Range & Units 03/13/22 11:14 10/02/22 15:54  Hemoglobin A1C 4.8 - 5.6 % 6.2 ! 9.2 (H)   Review of Glycemic Control  Diabetes history: DM2 Outpatient Diabetes medications: Levemir 12 units at bedtime, Novolog 0-15 units TID with meals, Trulicity 3 mg Qweek (Monday), Jardiance 10 mg QAM (not taken in 1 month due to cost) Current orders for Inpatient glycemic control: Semglee 12 units at bedtime, Novolog 0-9 units TID with meals  Inpatient Diabetes Program Recommendations:    HbgA1C: A1C 9.2% on 10/02/22 indicating an average glucose of 217 mg/dl over the past 2-3 months.  NOTE: Spoke with patient at bedside about diabetes and home regimen for diabetes control. Patient reports being followed by Dr. Elvera Lennox for diabetes management and last seen on 03/13/22.  Patient reports that she is taking Levemir 12 units at bedtime, Novolog 0-15 units TID with meals, Trulicity 3 mg Qweek (Monday), and Jardiance 10 mg QAM as an outpatient for diabetes control. Patient reports that she is in the doughnut hole so her medications are expensive. She states she has not taken Jardiance in over 1 month because it cost over $400 and she can not afford it.  Patient reports that the Levemir and Novolog are about $140 each which is a financial strain but she was able to get those.  She states she has one more Trulicity pen at home and she is not sure if she will be able to get the Trulciity filled if it is expensive.  Patient reports checking glucose 2-3 times  per day and it had been trending well but since she has been without the Jardiance it has been running higher.  Discussed A1C results (9.2% on 10/02/22) and explained that current A1C indicates an average glucose of 217 mg/dl over the past 2-3 months. Discussed glucose and A1C goals. Discussed importance of checking CBGs and maintaining good CBG control to prevent long-term and short-term complications. Stressed to the patient the importance of improving glycemic control to prevent further complications from uncontrolled diabetes. Patient also notes that she is having increased nerve pain in her knees and feels that is contributing to elevated A1C as well.  Encouraged patient to reach out to Dr. Charlean Sanfilippo office to let them know she can not afford to get the Jardiance refilled and see if they could possible adjust her insulin regimen to get DM  under better controlled. Discussed generic insulins at [facility] (Novolin N, Novolin R, Novolin 70/30) which are $25 per vial or $43 per box of 5 insulin pens. Encouraged patient to talk with Dr. Elvera Lennox about possibly using generic insulin if Levemir and Novolog are too expensive to get filled. Patient verbalized understanding of information discussed and reports no further questions at this time related to diabetes.  Thanks, Orlando Penner, RN, MSN, CDE Diabetes Coordinator Inpatient Diabetes Program [phone] (Team Pager)

## 2022-10-06 NOTE — Discharge Summary (Signed)
Physician Discharge Summary  YUVONNE LANAHAN ZOX:[id number] DOB: 1946/01/10 DOA: 10/02/2022  PCP: Oneita Hurt, No  Admit date: 10/02/2022 Discharge date: 10/06/2022  Admitted From: Home Disposition:  Home  Recommendations for Outpatient Follow-up:  Follow up with Cardiology, EP will arrange   Discharge Condition: Stable CODE STATUS: Full  Diet recommendation: Heart healthy   Brief/Interim Summary: [name] is a 77 y.o. female with medical history significant of afib on Coumadin, chronic systolic CHF with AICD, CAD, DM, HTN, HLD, and OSA presenting with an episode of VT lasting from 1127-1355 with HR 150.  She was started on Ranexa on 7/9 and developed intractable diarrhea on 7/10.  She was notified by the heart clinic that her ICD was detecting problems.  She sent a tracing with her device and it was still showing abnormal rhythm.  No symptoms.  This also happened a couple of weeks ago.  She was feeling a little weak.  She started taking Ranexa on on Tuesday night this week.  Wednesday about 0500, she awoke with diarrhea.  She had persistent diarrhea throughout the day Wednesday.  It got so bad that she put a diaper on.  She only took the one dose of Ranexa.  Diarrhea improved a little yesterday, only 1 stool today.  No sick contacts.  No n/v.  Stool test was negative for C. difficile and GI PCR panel was negative.  Diarrhea improved.  EP adjusted amiodarone dosing for discharge.    Discharge Diagnoses:   Principal Problem:   Ventricular tachycardia (HCC) Active Problems:   PAF (paroxysmal atrial fibrillation) (HCC)   HTN (hypertension)   Chronic systolic CHF (congestive heart failure) (HCC)   Hyperlipidemia   DM (diabetes mellitus), type 2 with renal complications (HCC)   Chronic kidney disease, stage 3b (HCC)   Acute kidney injury superimposed on chronic kidney disease (HCC)   Diarrhea   Seizure disorder (HCC)   Diarrhea -Diarrhea is not a known side effect of Ranexa.   Ranexa is on hold however -C. difficile, GI PCR panel negative -Improved   Recurrent V. tach -EP following -Amiodarone drip --> PO    AKI on CKD stage IIIb -Baseline creatinine 1.8 -Resolved   Chronic systolic CHF -EF 25 to 30% -Without acute exacerbation   A-fib -Remains on Coreg, amiodarone, Eliquis   Hypertension -Coreg, losartan   Diabetes mellitus, uncontrolled with hyperglycemia -A1c 9.2 -Trulicity, Jardiance, Ozempic    Hyperlipidemia -Crestor   CAD status post LAD and RCA PCI -Stable   Anxiety -BuSpar, sertraline   Seizure disorder -Keppra     Discharge Instructions  Discharge Instructions     (HEART FAILURE PATIENTS) Call MD:  Anytime you have any of the following symptoms: 1) 3 pound weight gain in 24 hours or 5 pounds in 1 week 2) shortness of breath, with or without a dry hacking cough 3) swelling in the hands, feet or stomach 4) if you have to sleep on extra pillows at night in order to breathe.   Complete by: As directed    Amb Referral to Nutrition and Diabetic Education   Complete by: As directed    Call MD for:  difficulty breathing, headache or visual disturbances   Complete by: As directed    Call MD for:  extreme fatigue   Complete by: As directed    Call MD for:  persistant dizziness or light-headedness   Complete by: As directed    Call MD for:  persistant nausea and vomiting   Complete  by: As directed    Call MD for:  severe uncontrolled pain   Complete by: As directed    Call MD for:  temperature >100.4   Complete by: As directed    Diet - low sodium heart healthy   Complete by: As directed    Discharge instructions   Complete by: As directed    You were cared for by a hospitalist during your hospital stay. If you have any questions about your discharge medications or the care you received while you were in the hospital after you are discharged, you can call the unit and ask to speak with the hospitalist on call if the  hospitalist that took care of you is not available. Once you are discharged, your primary care physician will handle any further medical issues. Please note that NO REFILLS for any discharge medications will be authorized once you are discharged, as it is imperative that you return to your primary care physician (or establish a relationship with a primary care physician if you do not have one) for your aftercare needs so that they can reassess your need for medications and monitor your lab values.   Increase activity slowly   Complete by: As directed       Allergies as of 10/06/2022       Reactions   Veltassa [patiromer] Diarrhea   Darvon Other (See Comments)   indigestion   Mexiletine Hcl    N/V, tremors, difficulty walking, blurred vision   Promethazine Hcl Other (See Comments)   hyperactivity   Ranexa [ranolazine Er] Diarrhea   Spironolactone Diarrhea, Nausea And Vomiting   Plavix [clopidogrel Bisulfate] Rash        Medication List     TAKE these medications    Accu-Chek Nano SmartView w/Device Kit Use to test blood sugar 4 times daily as instructed. Dx code: E11.59   acetaminophen 325 MG tablet Commonly known as: TYLENOL Take 650 mg by mouth every 6 (six) hours as needed for moderate pain.   amiodarone 200 MG tablet Commonly known as: PACERONE Take 1 tablet (200 mg total) by mouth 2 (two) times daily. For 1 month, then decrease to 200mg  once daily What changed:  medication strength how much to take when to take this additional instructions   busPIRone 5 MG tablet Commonly known as: BUSPAR Take 1 tablet (5 mg total) by mouth 2 (two) times daily.   carvedilol 12.5 MG tablet Commonly known as: COREG Take 1 tablet (12.5 mg total) by mouth 2 (two) times daily with a meal. What changed: how much to take   dicyclomine 10 MG capsule Commonly known as: BENTYL TAKE 1 CAPSULE (10 MG TOTAL) BY MOUTH 4 (FOUR) TIMES DAILY BEFORE MEALS AND AT BEDTIME.   Eliquis 5 MG Tabs  tablet Generic drug: apixaban TAKE 1 TABLET BY MOUTH TWICE A DAY   empagliflozin 10 MG Tabs tablet Commonly known as: JARDIANCE Take 1 tablet (10 mg total) by mouth every morning.   EYE VITAMINS PO Take 1 tablet by mouth 2 (two) times daily.   fluticasone 50 MCG/ACT nasal spray Commonly known as: FLONASE Place 1 spray into both nostrils daily as needed for allergies or rhinitis.   furosemide 20 MG tablet Commonly known as: LASIX TAKE 1 TABLET BY MOUTH EVERY DAY What changed: when to take this   gabapentin 100 MG capsule Commonly known as: NEURONTIN Take 2 capsules (200 mg total) by mouth 2 (two) times daily.   insulin detemir 100 UNIT/ML injection  Commonly known as: LEVEMIR Inject 12 Units into the skin at bedtime.   levETIRAcetam 250 MG tablet Commonly known as: KEPPRA TAKE 1 TABLET BY MOUTH TWICE A DAY   loperamide 2 MG capsule Commonly known as: IMODIUM Take 1 capsule (2 mg total) by mouth as needed for diarrhea or loose stools.   losartan 25 MG tablet Commonly known as: Cozaar Take 1 tablet (25 mg total) by mouth daily.   magnesium oxide 400 (240 Mg) MG tablet Commonly known as: MAG-OX TAKE 1 TABLET BY MOUTH TWICE A DAY   nitroGLYCERIN 0.4 MG SL tablet Commonly known as: NITROSTAT Place 1 tablet (0.4 mg total) under the tongue every 5 (five) minutes as needed for chest pain.   NovoLOG FlexPen 100 UNIT/ML FlexPen Generic drug: insulin aspart Inject up to 15 units daily under skin as advised What changed:  how much to take how to take this when to take this additional instructions   omeprazole 20 MG tablet Commonly known as: PRILOSEC OTC Take 20 mg by mouth every morning.   rosuvastatin 40 MG tablet Commonly known as: CRESTOR Take 1 tablet (40 mg total) by mouth daily. NEEDS FOLLOW UP APPOINTMENT FOR MORE REFILLS   sertraline 50 MG tablet Commonly known as: ZOLOFT Take 50 mg by mouth daily.   Trulicity 3 MG/0.5ML Sopn Generic drug:  Dulaglutide Inject 3 mg into the skin once a week.   Vitamin D3 50 MCG (2000 UT) Tabs Take 2,000 Units by mouth every morning.        Allergies  Allergen Reactions   Veltassa [Patiromer] Diarrhea   Darvon Other (See Comments)    indigestion   Mexiletine Hcl     N/V, tremors, difficulty walking, blurred vision   Promethazine Hcl Other (See Comments)    hyperactivity   Ranexa [Ranolazine Er] Diarrhea   Spironolactone Diarrhea and Nausea And Vomiting   Plavix [Clopidogrel Bisulfate] Rash    Consultations: Cardiology   Procedures/Studies: No results found.     Discharge Exam: Vitals:   10/06/22 0727 10/06/22 1225  BP: (!) 139/52 (!) 103/59  Pulse: 60 62  Resp: 16 14  Temp: 98 F (36.7 C) 98.2 F (36.8 C)  SpO2: 95% 96%    General: Pt is alert, awake, not in acute distress Cardiovascular: RRR, S1/S2 +, no edema Respiratory: CTA bilaterally, no wheezing, no rhonchi, no respiratory distress, no conversational dyspnea  Abdominal: Soft, NT, ND, bowel sounds + Extremities: no edema, no cyanosis Psych: Normal mood and affect, stable judgement and insight     The results of significant diagnostics from this hospitalization (including imaging, microbiology, ancillary and laboratory) are listed below for reference.     Microbiology: Recent Results (from the past 240 hour(s))  C Difficile Quick Screen w PCR reflex     Status: None   Collection Time: 10/03/22  8:00 AM   Specimen: STOOL  Result Value Ref Range Status   C Diff antigen NEGATIVE NEGATIVE Final   C Diff toxin NEGATIVE NEGATIVE Final   C Diff interpretation No C. difficile detected.  Final    Comment: Performed at [facility], [address] [place], [place] [address]  Gastrointestinal Panel by PCR , Stool     Status: None   Collection Time: 10/03/22  8:00 AM   Specimen: STOOL  Result Value Ref Range Status   Campylobacter species NOT DETECTED NOT DETECTED Final   Plesimonas shigelloides NOT  DETECTED NOT DETECTED Final   Salmonella species NOT DETECTED  NOT DETECTED Final   Yersinia enterocolitica NOT DETECTED NOT DETECTED Final   Vibrio species NOT DETECTED NOT DETECTED Final   Vibrio cholerae NOT DETECTED NOT DETECTED Final   Enteroaggregative E coli (EAEC) NOT DETECTED NOT DETECTED Final   Enteropathogenic E coli (EPEC) NOT DETECTED NOT DETECTED Final   Enterotoxigenic E coli (ETEC) NOT DETECTED NOT DETECTED Final   Shiga like toxin producing E coli (STEC) NOT DETECTED NOT DETECTED Final   Shigella/Enteroinvasive E coli (EIEC) NOT DETECTED NOT DETECTED Final   Cryptosporidium NOT DETECTED NOT DETECTED Final   Cyclospora cayetanensis NOT DETECTED NOT DETECTED Final   Entamoeba histolytica NOT DETECTED NOT DETECTED Final   Giardia lamblia NOT DETECTED NOT DETECTED Final   Adenovirus F40/41 NOT DETECTED NOT DETECTED Final   Astrovirus NOT DETECTED NOT DETECTED Final   Norovirus GI/GII NOT DETECTED NOT DETECTED Final   Rotavirus A NOT DETECTED NOT DETECTED Final   Sapovirus (I, II, IV, and V) NOT DETECTED NOT DETECTED Final    Comment: Performed at [facility], [address] [place], [place] [address]     Labs: BNP (last 3 results) Recent Labs    06/22/22 1159  BNP 1,422.7*   Basic Metabolic Panel: Recent Labs  Lab 10/02/22 1210 10/03/22 0205 10/04/22 0138 10/05/22 0313 10/05/22 2342  NA 141 137 135 133* 134*  K 4.6 4.4 4.1 4.3 4.9  CL 102 104 102 102 104  CO2 21* 24 25 25 24   GLUCOSE 199* 175* 177* 148* 180*  BUN 36* 33* 25* 23 22  CREATININE 2.41* 1.90* 1.60* 1.72* 1.41*  CALCIUM 9.2 8.5* 8.4* 8.1* 8.3*  MG 2.5*  --  2.1  --   --    Liver Function Tests: Recent Labs  Lab 10/02/22 1210  AST 36  ALT 45*  ALKPHOS 85  BILITOT 0.3  PROT 7.2  ALBUMIN 3.8   No results for input(s): "LIPASE", "AMYLASE" in the last 168 hours. No results for input(s): "AMMONIA" in the last 168 hours. CBC: Recent Labs  Lab 10/02/22 1118 10/03/22 0205   WBC 8.0 6.0  HGB 12.4 10.9*  HCT 40.6 34.8*  MCV 96.7 90.2  PLT 215 189   Cardiac Enzymes: No results for input(s): "CKTOTAL", "CKMB", "CKMBINDEX", "TROPONINI" in the last 168 hours. BNP: Invalid input(s): "POCBNP" CBG: Recent Labs  Lab 10/05/22 1137 10/05/22 1625 10/05/22 2140 10/06/22 0756 10/06/22 1148  GLUCAP 216* 114* 204* 120* 240*   D-Dimer No results for input(s): "DDIMER" in the last 72 hours. Hgb A1c No results for input(s): "HGBA1C" in the last 72 hours. Lipid Profile No results for input(s): "CHOL", "HDL", "LDLCALC", "TRIG", "CHOLHDL", "LDLDIRECT" in the last 72 hours. Thyroid function studies No results for input(s): "TSH", "T4TOTAL", "T3FREE", "THYROIDAB" in the last 72 hours.  Invalid input(s): "FREET3" Anemia work up No results for input(s): "VITAMINB12", "FOLATE", "FERRITIN", "TIBC", "IRON", "RETICCTPCT" in the last 72 hours. Urinalysis    Component Value Date/Time   COLORURINE YELLOW 08/31/2021 0727   APPEARANCEUR CLEAR 08/31/2021 0727   LABSPEC 1.020 08/31/2021 0727   PHURINE 5.5 08/31/2021 0727   GLUCOSEU >=500 (A) 08/31/2021 0727   GLUCOSEU NEGATIVE 08/16/2018 1312   HGBUR SMALL (A) 08/31/2021 0727   BILIRUBINUR NEGATIVE 08/31/2021 0727   KETONESUR NEGATIVE 08/31/2021 0727   PROTEINUR NEGATIVE 08/31/2021 0727   UROBILINOGEN 1.0 08/16/2018 1312   NITRITE NEGATIVE 08/31/2021 0727   LEUKOCYTESUR SMALL (A) 08/31/2021 0727   Sepsis Labs Recent Labs  Lab 10/02/22 1118 10/03/22 0205  WBC 8.0 6.0   Microbiology Recent Results (from the past 240 hour(s))  C Difficile Quick Screen w PCR reflex     Status: None   Collection Time: 10/03/22  8:00 AM   Specimen: STOOL  Result Value Ref Range Status   C Diff antigen NEGATIVE NEGATIVE Final   C Diff toxin NEGATIVE NEGATIVE Final   C Diff interpretation No C. difficile detected.  Final    Comment: Performed at [facility], [address], [place], [place] [address]  Gastrointestinal Panel  by PCR , Stool     Status: None   Collection Time: 10/03/22  8:00 AM   Specimen: STOOL  Result Value Ref Range Status   Campylobacter species NOT DETECTED NOT DETECTED Final   Plesimonas shigelloides NOT DETECTED NOT DETECTED Final   Salmonella species NOT DETECTED NOT DETECTED Final   Yersinia enterocolitica NOT DETECTED NOT DETECTED Final   Vibrio species NOT DETECTED NOT DETECTED Final   Vibrio cholerae NOT DETECTED NOT DETECTED Final   Enteroaggregative E coli (EAEC) NOT DETECTED NOT DETECTED Final   Enteropathogenic E coli (EPEC) NOT DETECTED NOT DETECTED Final   Enterotoxigenic E coli (ETEC) NOT DETECTED NOT DETECTED Final   Shiga like toxin producing E coli (STEC) NOT DETECTED NOT DETECTED Final   Shigella/Enteroinvasive E coli (EIEC) NOT DETECTED NOT DETECTED Final   Cryptosporidium NOT DETECTED NOT DETECTED Final   Cyclospora cayetanensis NOT DETECTED NOT DETECTED Final   Entamoeba histolytica NOT DETECTED NOT DETECTED Final   Giardia lamblia NOT DETECTED NOT DETECTED Final   Adenovirus F40/41 NOT DETECTED NOT DETECTED Final   Astrovirus NOT DETECTED NOT DETECTED Final   Norovirus GI/GII NOT DETECTED NOT DETECTED Final   Rotavirus A NOT DETECTED NOT DETECTED Final   Sapovirus (I, II, IV, and V) NOT DETECTED NOT DETECTED Final    Comment: Performed at [facility], [address] [place], [place] [address]     Patient was seen and examined on the day of discharge and was found to be in stable condition. Time coordinating discharge: 25 minutes including assessment and coordination of care, as well as examination of the patient.   SIGNED:  Noralee Stain, DO [facility] 10/06/2022, 1:33 PM

## 2022-10-12 ENCOUNTER — Encounter (HOSPITAL_COMMUNITY): Payer: Self-pay

## 2022-10-12 ENCOUNTER — Telehealth: Payer: Self-pay

## 2022-10-12 ENCOUNTER — Other Ambulatory Visit: Payer: Self-pay

## 2022-10-12 ENCOUNTER — Inpatient Hospital Stay (HOSPITAL_COMMUNITY)
Admission: EM | Admit: 2022-10-12 | Discharge: 2022-10-16 | DRG: 287 | Disposition: A | Discharge status: Home / Self Care | Payer: Medicare HMO | Attending: Cardiology | Admitting: Cardiology

## 2022-10-12 ENCOUNTER — Emergency Department (HOSPITAL_COMMUNITY): Payer: Medicare HMO

## 2022-10-12 DIAGNOSIS — N179 Acute kidney failure, unspecified: Secondary | ICD-10-CM | POA: Diagnosis present

## 2022-10-12 DIAGNOSIS — I4819 Other persistent atrial fibrillation: Secondary | ICD-10-CM | POA: Diagnosis not present

## 2022-10-12 DIAGNOSIS — Z8673 Personal history of transient ischemic attack (TIA), and cerebral infarction without residual deficits: Secondary | ICD-10-CM | POA: Diagnosis not present

## 2022-10-12 DIAGNOSIS — Z794 Long term (current) use of insulin: Secondary | ICD-10-CM | POA: Diagnosis not present

## 2022-10-12 DIAGNOSIS — N1832 Chronic kidney disease, stage 3b: Secondary | ICD-10-CM | POA: Diagnosis present

## 2022-10-12 DIAGNOSIS — I130 Hypertensive heart and chronic kidney disease with heart failure and stage 1 through stage 4 chronic kidney disease, or unspecified chronic kidney disease: Secondary | ICD-10-CM | POA: Diagnosis present

## 2022-10-12 DIAGNOSIS — E785 Hyperlipidemia, unspecified: Secondary | ICD-10-CM | POA: Diagnosis present

## 2022-10-12 DIAGNOSIS — Z7985 Long-term (current) use of injectable non-insulin antidiabetic drugs: Secondary | ICD-10-CM | POA: Diagnosis not present

## 2022-10-12 DIAGNOSIS — I4901 Ventricular fibrillation: Secondary | ICD-10-CM | POA: Diagnosis present

## 2022-10-12 DIAGNOSIS — E039 Hypothyroidism, unspecified: Secondary | ICD-10-CM | POA: Diagnosis present

## 2022-10-12 DIAGNOSIS — I255 Ischemic cardiomyopathy: Secondary | ICD-10-CM | POA: Diagnosis present

## 2022-10-12 DIAGNOSIS — I472 Ventricular tachycardia, unspecified: Principal | ICD-10-CM | POA: Diagnosis present

## 2022-10-12 DIAGNOSIS — Z7984 Long term (current) use of oral hypoglycemic drugs: Secondary | ICD-10-CM

## 2022-10-12 DIAGNOSIS — Z79899 Other long term (current) drug therapy: Secondary | ICD-10-CM | POA: Diagnosis not present

## 2022-10-12 DIAGNOSIS — E1122 Type 2 diabetes mellitus with diabetic chronic kidney disease: Secondary | ICD-10-CM | POA: Diagnosis present

## 2022-10-12 DIAGNOSIS — Z888 Allergy status to other drugs, medicaments and biological substances status: Secondary | ICD-10-CM

## 2022-10-12 DIAGNOSIS — I5022 Chronic systolic (congestive) heart failure: Secondary | ICD-10-CM | POA: Diagnosis present

## 2022-10-12 DIAGNOSIS — I2510 Atherosclerotic heart disease of native coronary artery without angina pectoris: Secondary | ICD-10-CM | POA: Diagnosis present

## 2022-10-12 DIAGNOSIS — I480 Paroxysmal atrial fibrillation: Secondary | ICD-10-CM | POA: Diagnosis present

## 2022-10-12 DIAGNOSIS — D6869 Other thrombophilia: Secondary | ICD-10-CM | POA: Diagnosis present

## 2022-10-12 DIAGNOSIS — N1831 Chronic kidney disease, stage 3a: Secondary | ICD-10-CM

## 2022-10-12 DIAGNOSIS — Z7901 Long term (current) use of anticoagulants: Secondary | ICD-10-CM | POA: Diagnosis not present

## 2022-10-12 DIAGNOSIS — E1165 Type 2 diabetes mellitus with hyperglycemia: Secondary | ICD-10-CM | POA: Diagnosis present

## 2022-10-12 DIAGNOSIS — Z95810 Presence of automatic (implantable) cardiac defibrillator: Secondary | ICD-10-CM

## 2022-10-12 LAB — GLUCOSE, CAPILLARY
Glucose-Capillary: 184 mg/dL — ABNORMAL HIGH (ref 70–99)
Glucose-Capillary: 188 mg/dL — ABNORMAL HIGH (ref 70–99)
Glucose-Capillary: 165 mg/dL — ABNORMAL HIGH (ref 70–99)
Glucose-Capillary: 174 mg/dL — ABNORMAL HIGH (ref 70–99)

## 2022-10-12 LAB — CBG MONITORING, ED: Glucose-Capillary: 175 mg/dL — ABNORMAL HIGH (ref 70–99)

## 2022-10-12 LAB — BASIC METABOLIC PANEL
Anion gap: 13 (ref 5–15)
BUN: 48 mg/dL — ABNORMAL HIGH (ref 8–23)
CO2: 23 mmol/L (ref 22–32)
Calcium: 8.5 mg/dL — ABNORMAL LOW (ref 8.9–10.3)
Chloride: 104 mmol/L (ref 98–111)
Creatinine, Ser: 2.74 mg/dL — ABNORMAL HIGH (ref 0.44–1.00)
GFR, Estimated: 18 mL/min — ABNORMAL LOW (ref >60)
Glucose, Bld: 174 mg/dL — ABNORMAL HIGH (ref 70–99)
Potassium: 4.1 mmol/L (ref 3.5–5.1)
Sodium: 140 mmol/L (ref 135–145)

## 2022-10-12 LAB — MRSA NEXT GEN BY PCR, NASAL: MRSA by PCR Next Gen: NOT DETECTED

## 2022-10-12 LAB — BRAIN NATRIURETIC PEPTIDE: B Natriuretic Peptide: 2603.7 pg/mL — ABNORMAL HIGH (ref 0.0–100.0)

## 2022-10-12 LAB — TROPONIN I (HIGH SENSITIVITY)
Troponin I (High Sensitivity): 3748 ng/L (ref <18)
Troponin I (High Sensitivity): 3205 ng/L (ref <18)

## 2022-10-12 LAB — CBC
HCT: 35.6 % — ABNORMAL LOW (ref 36.0–46.0)
Hemoglobin: 11.1 g/dL — ABNORMAL LOW (ref 12.0–15.0)
MCH: 29.3 pg (ref 26.0–34.0)
MCHC: 31.2 g/dL (ref 30.0–36.0)
MCV: 93.9 fL (ref 80.0–100.0)
Platelets: 200 10*3/uL (ref 150–400)
RBC: 3.79 MIL/uL — ABNORMAL LOW (ref 3.87–5.11)
RDW: 16.8 % — ABNORMAL HIGH (ref 11.5–15.5)
WBC: 7.9 10*3/uL (ref 4.0–10.5)
nRBC: 0 % (ref 0.0–0.2)

## 2022-10-12 LAB — MAGNESIUM: Magnesium: 2.5 mg/dL — ABNORMAL HIGH (ref 1.7–2.4)

## 2022-10-12 MED ORDER — ROSUVASTATIN CALCIUM 20 MG PO TABS
40.0000 mg | ORAL_TABLET | Freq: Every day | ORAL | Status: DC
  Administered 2022-10-12 – 2022-10-16 (×5): 40 mg via ORAL
  Filled 2022-10-12 (×5): qty 2

## 2022-10-12 MED ORDER — MAGNESIUM OXIDE -MG SUPPLEMENT 400 (240 MG) MG PO TABS
400.0000 mg | ORAL_TABLET | Freq: Two times a day (BID) | ORAL | Status: DC
  Administered 2022-10-12 – 2022-10-16 (×8): 400 mg via ORAL
  Filled 2022-10-12 (×8): qty 1

## 2022-10-12 MED ORDER — ASPIRIN 81 MG PO TBEC: 81.0000 mg | DELAYED_RELEASE_TABLET | Freq: Every day | ORAL | Status: DC

## 2022-10-12 MED ORDER — INSULIN DETEMIR 100 UNIT/ML ~~LOC~~ SOLN
12.0000 [IU] | Freq: Every day | SUBCUTANEOUS | Status: DC
  Administered 2022-10-12 – 2022-10-13 (×2): 12 [IU] via SUBCUTANEOUS
  Filled 2022-10-12 (×3): qty 0.12

## 2022-10-12 MED ORDER — BUSPIRONE HCL 10 MG PO TABS
5.0000 mg | ORAL_TABLET | Freq: Two times a day (BID) | ORAL | Status: DC
  Administered 2022-10-12 – 2022-10-16 (×8): 5 mg via ORAL
  Filled 2022-10-12 (×8): qty 1

## 2022-10-12 MED ORDER — CHLORHEXIDINE GLUCONATE CLOTH 2 % EX PADS
6.0000 | MEDICATED_PAD | Freq: Every day | CUTANEOUS | Status: DC
  Administered 2022-10-12 – 2022-10-15 (×4): 6 via TOPICAL

## 2022-10-12 MED ORDER — ASPIRIN 81 MG PO CHEW
81.0000 mg | CHEWABLE_TABLET | ORAL | Status: AC
  Administered 2022-10-13: 81 mg via ORAL
  Filled 2022-10-12: qty 1

## 2022-10-12 MED ORDER — AMIODARONE HCL IN DEXTROSE 360-4.14 MG/200ML-% IV SOLN
30.0000 mg/h | INTRAVENOUS | Status: AC
  Administered 2022-10-12 – 2022-10-15 (×6): 30 mg/h via INTRAVENOUS
  Filled 2022-10-12 (×5): qty 200

## 2022-10-12 MED ORDER — AMIODARONE HCL IN DEXTROSE 360-4.14 MG/200ML-% IV SOLN: 30.0000 mg/h | INTRAVENOUS | Status: DC

## 2022-10-12 MED ORDER — AMIODARONE LOAD VIA INFUSION: 150.0000 mg | Freq: Once | INTRAVENOUS | Status: DC

## 2022-10-12 MED ORDER — GABAPENTIN 100 MG PO CAPS
200.0000 mg | ORAL_CAPSULE | Freq: Two times a day (BID) | ORAL | Status: DC
  Administered 2022-10-12 – 2022-10-16 (×8): 200 mg via ORAL
  Filled 2022-10-12 (×8): qty 2

## 2022-10-12 MED ORDER — NITROGLYCERIN 0.4 MG SL SUBL: 0.4000 mg | SUBLINGUAL_TABLET | SUBLINGUAL | Status: DC | PRN

## 2022-10-12 MED ORDER — AMIODARONE HCL IN DEXTROSE 360-4.14 MG/200ML-% IV SOLN
60.0000 mg/h | INTRAVENOUS | Status: AC
  Administered 2022-10-12 (×2): 60 mg/h via INTRAVENOUS
  Filled 2022-10-12 (×2): qty 200

## 2022-10-12 MED ORDER — VITAMIN D 25 MCG (1000 UNIT) PO TABS
2000.0000 [IU] | ORAL_TABLET | Freq: Every morning | ORAL | Status: DC
  Administered 2022-10-13 – 2022-10-16 (×4): 2000 [IU] via ORAL
  Filled 2022-10-12 (×5): qty 2

## 2022-10-12 MED ORDER — SODIUM CHLORIDE 0.9 % WEIGHT BASED INFUSION: 1.0000 mL/kg/h | INTRAVENOUS | Status: DC

## 2022-10-12 MED ORDER — DICYCLOMINE HCL 10 MG PO CAPS
10.0000 mg | ORAL_CAPSULE | Freq: Three times a day (TID) | ORAL | Status: DC
  Administered 2022-10-12 – 2022-10-16 (×16): 10 mg via ORAL
  Filled 2022-10-12 (×21): qty 1

## 2022-10-12 MED ORDER — SERTRALINE HCL 25 MG PO TABS
50.0000 mg | ORAL_TABLET | Freq: Every day | ORAL | Status: DC
  Administered 2022-10-12 – 2022-10-16 (×5): 50 mg via ORAL
  Filled 2022-10-12: qty 2
  Filled 2022-10-12: qty 1
  Filled 2022-10-12 (×2): qty 2
  Filled 2022-10-12: qty 1

## 2022-10-12 MED ORDER — AMIODARONE LOAD VIA INFUSION
150.0000 mg | Freq: Once | INTRAVENOUS | Status: AC
  Administered 2022-10-12: 150 mg via INTRAVENOUS
  Filled 2022-10-12: qty 83.34

## 2022-10-12 MED ORDER — ASPIRIN 81 MG PO TBEC
81.0000 mg | DELAYED_RELEASE_TABLET | Freq: Every day | ORAL | Status: DC
  Administered 2022-10-15 – 2022-10-16 (×2): 81 mg via ORAL
  Filled 2022-10-12 (×2): qty 1

## 2022-10-12 MED ORDER — PANTOPRAZOLE SODIUM 40 MG PO TBEC
40.0000 mg | DELAYED_RELEASE_TABLET | Freq: Every morning | ORAL | Status: DC
  Administered 2022-10-13 – 2022-10-16 (×4): 40 mg via ORAL
  Filled 2022-10-12: qty 1
  Filled 2022-10-12: qty 2
  Filled 2022-10-12: qty 1
  Filled 2022-10-12 (×2): qty 2

## 2022-10-12 MED ORDER — INSULIN ASPART 100 UNIT/ML IJ SOLN
0.0000 [IU] | Freq: Every day | INTRAMUSCULAR | Status: DC
  Administered 2022-10-13: 2 [IU] via SUBCUTANEOUS

## 2022-10-12 MED ORDER — SODIUM CHLORIDE 0.9 % WEIGHT BASED INFUSION
3.0000 mL/kg/h | INTRAVENOUS | Status: DC
  Administered 2022-10-13: 3 mL/kg/h via INTRAVENOUS

## 2022-10-12 MED ORDER — ACETAMINOPHEN 325 MG PO TABS
650.0000 mg | ORAL_TABLET | ORAL | Status: DC | PRN
  Administered 2022-10-14 – 2022-10-15 (×2): 650 mg via ORAL
  Filled 2022-10-12 (×2): qty 2

## 2022-10-12 MED ORDER — ORAL CARE MOUTH RINSE: 15.0000 mL | OROMUCOSAL | Status: DC | PRN

## 2022-10-12 MED ORDER — HEPARIN (PORCINE) 25000 UT/250ML-% IV SOLN
1050.0000 [IU]/h | INTRAVENOUS | Status: DC
  Administered 2022-10-12: 950 [IU]/h via INTRAVENOUS
  Administered 2022-10-13: 1100 [IU]/h via INTRAVENOUS
  Filled 2022-10-12: qty 250

## 2022-10-12 MED ORDER — LOSARTAN POTASSIUM 25 MG PO TABS
25.0000 mg | ORAL_TABLET | Freq: Every day | ORAL | Status: DC
  Administered 2022-10-13: 25 mg via ORAL
  Filled 2022-10-12 (×2): qty 1

## 2022-10-12 MED ORDER — INSULIN ASPART 100 UNIT/ML IJ SOLN
0.0000 [IU] | Freq: Three times a day (TID) | INTRAMUSCULAR | Status: DC
  Administered 2022-10-12 (×2): 2 [IU] via SUBCUTANEOUS
  Administered 2022-10-13: 3 [IU] via SUBCUTANEOUS
  Administered 2022-10-13 – 2022-10-14 (×3): 1 [IU] via SUBCUTANEOUS
  Administered 2022-10-15: 2 [IU] via SUBCUTANEOUS

## 2022-10-12 MED ORDER — HEPARIN (PORCINE) 25000 UT/250ML-% IV SOLN
950.0000 [IU]/h | INTRAVENOUS | Status: DC
  Filled 2022-10-12: qty 250

## 2022-10-12 MED ORDER — AMIODARONE HCL IN DEXTROSE 360-4.14 MG/200ML-% IV SOLN: 60.0000 mg/h | INTRAVENOUS | Status: DC

## 2022-10-12 MED ORDER — AMIODARONE HCL IN DEXTROSE 360-4.14 MG/200ML-% IV SOLN: 60.0000 mg/h | INTRAVENOUS | Status: AC

## 2022-10-12 MED ORDER — CARVEDILOL 12.5 MG PO TABS
12.5000 mg | ORAL_TABLET | Freq: Two times a day (BID) | ORAL | Status: DC
  Administered 2022-10-12 – 2022-10-16 (×8): 12.5 mg via ORAL
  Filled 2022-10-12 (×9): qty 1

## 2022-10-12 MED ORDER — FLUTICASONE PROPIONATE 50 MCG/ACT NA SUSP: 1.0000 | Freq: Every day | NASAL | Status: DC | PRN

## 2022-10-12 NOTE — Plan of Care (Signed)
Patient remains on CVICU at time of writing. Plan for heart cath tomorrow AM. IV infusions of amiodarone and heparin are on-going. No supplemental O2 requirement.   Problem: Education: Goal: Understanding of CV disease, CV risk reduction, and recovery process will improve Outcome: Progressing Goal: Individualized Educational Video(s) Outcome: Progressing   Problem: Activity: Goal: Ability to return to baseline activity level will improve Outcome: Progressing   Problem: Cardiovascular: Goal: Ability to achieve and maintain adequate cardiovascular perfusion will improve Outcome: Progressing Goal: Vascular access site(s) Level 0-1 will be maintained Outcome: Progressing   Problem: Health Behavior/Discharge Planning: Goal: Ability to safely manage health-related needs after discharge will improve Outcome: Progressing   Problem: Education: Goal: Ability to describe self-care measures that may prevent or decrease complications (Diabetes Survival Skills Education) will improve Outcome: Progressing Goal: Individualized Educational Video(s) Outcome: Progressing   Problem: Coping: Goal: Ability to adjust to condition or change in health will improve Outcome: Progressing   Problem: Fluid Volume: Goal: Ability to maintain a balanced intake and output will improve Outcome: Progressing   Problem: Health Behavior/Discharge Planning: Goal: Ability to identify and utilize available resources and services will improve Outcome: Progressing Goal: Ability to manage health-related needs will improve Outcome: Progressing   Problem: Metabolic: Goal: Ability to maintain appropriate glucose levels will improve Outcome: Progressing   Problem: Nutritional: Goal: Maintenance of adequate nutrition will improve Outcome: Progressing Goal: Progress toward achieving an optimal weight will improve Outcome: Progressing   Problem: Skin Integrity: Goal: Risk for impaired skin integrity will  decrease Outcome: Progressing   Problem: Tissue Perfusion: Goal: Adequacy of tissue perfusion will improve Outcome: Progressing   Problem: Education: Goal: Knowledge of General Education information will improve Description: Including pain rating scale, medication(s)/side effects and non-pharmacologic comfort measures Outcome: Progressing   Problem: Health Behavior/Discharge Planning: Goal: Ability to manage health-related needs will improve Outcome: Progressing   Problem: Clinical Measurements: Goal: Ability to maintain clinical measurements within normal limits will improve Outcome: Progressing Goal: Diagnostic test results will improve Outcome: Progressing Goal: Respiratory complications will improve Outcome: Progressing Goal: Cardiovascular complication will be avoided Outcome: Progressing   Problem: Activity: Goal: Risk for activity intolerance will decrease Outcome: Progressing   Problem: Nutrition: Goal: Adequate nutrition will be maintained Outcome: Progressing   Problem: Coping: Goal: Level of anxiety will decrease Outcome: Progressing   Problem: Elimination: Goal: Will not experience complications related to bowel motility Outcome: Progressing Goal: Will not experience complications related to urinary retention Outcome: Progressing   Problem: Pain Managment: Goal: General experience of comfort will improve Outcome: Progressing   Problem: Safety: Goal: Ability to remain free from injury will improve Outcome: Progressing   Problem: Skin Integrity: Goal: Risk for impaired skin integrity will decrease Outcome: Progressing

## 2022-10-12 NOTE — ED Provider Notes (Signed)
[place] EMERGENCY DEPARTMENT AT [facility] Provider Note   CSN: [id number] Arrival date & time: 10/12/22  9629     History {Add pertinent medical, surgical, social history, OB history to HPI:1} No chief complaint on file.   [name] is a 77 y.o. female.  Patient is a 77 year old female with a history of CHF, diabetes, hypertension, prior stroke, ischemic cardiomyopathy with history of multiple episodes of V. tach and V-fib with defibrillator firing, chronic atrial fibrillation on amiodarone and Eliquis who was recently hospitalized last week for V-fib storm who is presenting today after cardiology called her and told her to come to the hospital as she had 2 episodes of V-fib yesterday that required defibrillation.  Patient reports that she has not felt well over the weekend she has been weak and tired with nausea and some discomfort in her upper abdomen that felt like gas pain.  She was short of breath yesterday but not denies feeling short of breath today.  She has been compliant with her medications.  She denies a cough but her son said she has not mentally been clear and has seemed a little bit off.  Patient denies recognizing that her defibrillator fired yesterday.  She denies any black stool and has been on the Eliquis for a while.  The history is provided by the patient and medical records.       Home Medications Prior to Admission medications   Medication Sig Start Date End Date Taking? Authorizing Provider  acetaminophen (TYLENOL) 325 MG tablet Take 650 mg by mouth every 6 (six) hours as needed for moderate pain.    [provider]  amiodarone (PACERONE) 200 MG tablet Take 1 tablet (200 mg total) by mouth 2 (two) times daily. For 1 month, then decrease to 200mg  once daily 10/06/22 11/05/22  Noralee Stain, DO  Blood Glucose Monitoring Suppl (ACCU-CHEK NANO SMARTVIEW) W/DEVICE KIT Use to test blood sugar 4 times daily as instructed. Dx code: E11.59  03/01/14   Carlus Pavlov, MD  busPIRone (BUSPAR) 5 MG tablet Take 1 tablet (5 mg total) by mouth 2 (two) times daily. 05/20/21   Jacklynn Ganong, FNP  carvedilol (COREG) 12.5 MG tablet Take 1 tablet (12.5 mg total) by mouth 2 (two) times daily with a meal. 10/06/22   Noralee Stain, DO  Cholecalciferol (VITAMIN D3) 2000 UNITS TABS Take 2,000 Units by mouth every morning.    [provider]  dicyclomine (BENTYL) 10 MG capsule TAKE 1 CAPSULE (10 MG TOTAL) BY MOUTH 4 (FOUR) TIMES DAILY BEFORE MEALS AND AT BEDTIME. 02/09/20   Corwin Levins, MD  Dulaglutide (TRULICITY) 3 MG/0.5ML SOPN Inject 3 mg into the skin once a week. 03/13/22   Carlus Pavlov, MD  ELIQUIS 5 MG TABS tablet TAKE 1 TABLET BY MOUTH TWICE A DAY 12/22/21   Laurey Morale, MD  empagliflozin (JARDIANCE) 10 MG TABS tablet Take 1 tablet (10 mg total) by mouth every morning. 11/11/21   Carlus Pavlov, MD  fluticasone (FLONASE) 50 MCG/ACT nasal spray Place 1 spray into both nostrils daily as needed for allergies or rhinitis. 03/13/19 07/15/23  [provider]  furosemide (LASIX) 20 MG tablet TAKE 1 TABLET BY MOUTH EVERY DAY Patient taking differently: Take 20 mg by mouth 2 (two) times daily. 08/21/22   Milford, Anderson Malta, FNP  gabapentin (NEURONTIN) 100 MG capsule Take 2 capsules (200 mg total) by mouth 2 (two) times daily. 06/25/22   Carlus Pavlov, MD  insulin aspart (  NOVOLOG FLEXPEN) 100 UNIT/ML FlexPen Inject up to 15 units daily under skin as advised Patient taking differently: Inject 0-15 Units into the skin 3 (three) times daily. Sliding scale 06/24/22   Carlus Pavlov, MD  insulin detemir (LEVEMIR) 100 UNIT/ML injection Inject 12 Units into the skin at bedtime.    [provider]  levETIRAcetam (KEPPRA) 250 MG tablet TAKE 1 TABLET BY MOUTH TWICE A DAY 09/16/22   Windell Norfolk, MD  loperamide (IMODIUM) 2 MG capsule Take 1 capsule (2 mg total) by mouth as needed for diarrhea or loose stools. 10/06/22    Noralee Stain, DO  losartan (COZAAR) 25 MG tablet Take 1 tablet (25 mg total) by mouth daily. 06/15/22 06/15/23  Graciella Freer, PA-C  magnesium oxide (MAG-OX) 400 (240 Mg) MG tablet TAKE 1 TABLET BY MOUTH TWICE A DAY 12/19/21   Corwin Levins, MD  Multiple Vitamins-Minerals (EYE VITAMINS PO) Take 1 tablet by mouth 2 (two) times daily.    [provider]  nitroGLYCERIN (NITROSTAT) 0.4 MG SL tablet Place 1 tablet (0.4 mg total) under the tongue every 5 (five) minutes as needed for chest pain. 05/20/21   Milford, Anderson Malta, FNP  omeprazole (PRILOSEC OTC) 20 MG tablet Take 20 mg by mouth every morning.    [provider]  rosuvastatin (CRESTOR) 40 MG tablet Take 1 tablet (40 mg total) by mouth daily. NEEDS FOLLOW UP APPOINTMENT FOR MORE REFILLS 09/17/22   Laurey Morale, MD  sertraline (ZOLOFT) 50 MG tablet Take 50 mg by mouth daily. 09/17/22   [provider]      Allergies    Veltassa [patiromer], Darvon, Mexiletine hcl, Promethazine hcl, Ranexa [ranolazine er], Spironolactone, and Plavix [clopidogrel bisulfate]    Review of Systems   Review of Systems  Physical Exam Updated Vital Signs BP (!) 104/57 (BP Location: Right Arm)   Pulse 74   Temp 98.2 F (36.8 C) (Oral)   Resp 17   SpO2 96%  Physical Exam Vitals and nursing note reviewed.  Constitutional:      General: She is not in acute distress.    Appearance: She is well-developed. She is ill-appearing.  HENT:     Head: Normocephalic and atraumatic.  Eyes:     Pupils: Pupils are equal, round, and reactive to light.  Cardiovascular:     Rate and Rhythm: Normal rate. Rhythm irregularly irregular.     Heart sounds: Normal heart sounds. No murmur heard.    No friction rub.  Pulmonary:     Effort: Pulmonary effort is normal.     Breath sounds: Normal breath sounds. No wheezing or rales.  Abdominal:     General: Bowel sounds are normal. There is no distension.     Palpations: Abdomen is soft.      Tenderness: There is no abdominal tenderness. There is no guarding or rebound.  Musculoskeletal:        General: No tenderness. Normal range of motion.     Right lower leg: Edema present.     Left lower leg: Edema present.     Comments: No edema  Skin:    General: Skin is warm and dry.     Coloration: Skin is pale.     Findings: No rash.  Neurological:     Mental Status: She is alert and oriented to person, place, and time.     Cranial Nerves: No cranial nerve deficit.  Psychiatric:        Behavior: Behavior normal.  ED Results / Procedures / Treatments   Labs (all labs ordered are listed, but only abnormal results are displayed) Labs Reviewed  BASIC METABOLIC PANEL  CBC  TROPONIN I (HIGH SENSITIVITY)    EKG EKG Interpretation Date/Time:  Monday October 12 2022 09:57:13 EDT Ventricular Rate:  59 PR Interval:    QRS Duration:  114 QT Interval:  478 QTC Calculation: 473 R Axis:   -44  Text Interpretation: Atrial fibrillation with slow ventricular response with frequent ventricular-paced complexes Left axis deviation Incomplete left bundle branch block Left ventricular hypertrophy with repolarization abnormality ( Cornell product ) Abnormal ECG When compared with ECG of 02-Oct-2022 10:47, PREVIOUS ECG IS PRESENT agree. Confirmed by Arby Barrette [phone]) on 10/12/2022 10:00:55 AM  Radiology No results found.  Procedures Procedures  {Document cardiac monitor, telemetry assessment procedure when appropriate:1}  Medications Ordered in ED Medications - No data to display  ED Course/ Medical Decision Making/ A&P   {   Click here for ABCD2, HEART and other calculatorsREFRESH Note before signing :1}                          Medical Decision Making Amount and/or Complexity of Data Reviewed Labs: ordered. Radiology: ordered.   Pt with multiple medical problems and comorbidities and presenting today with a complaint that caries a high risk for morbidity and mortality.   Here today due to generally not feeling well but having her defibrillator fired twice yesterday for V-fib.  Patient is awake and alert and was brought in by her son today.  Patient has some mild evidence of fluid overload with some edema in her lower extremities.  However concern for possible electrolyte abnormality, anemia as possible contributing factors to her dysrhythmia versus poor control with her current medications.  Defibrillator appears to be working well.  Patient was placed on the monitor but because she has a defibrillator does not need to be on pads at this time.  I independently interpreted patient's EKG which currently shows a atrial fibrillation with some pacing and at a rate of 74.  Low suspicion for infection at this time.  Cardiology consulted and are seeing patient at bedside.  She remains on continuous cardiac monitoring.   {Document critical care time when appropriate:1} {Document review of labs and clinical decision tools ie heart score, Chads2Vasc2 etc:1}  {Document your independent review of radiology images, and any outside records:1} {Document your discussion with family members, caretakers, and with consultants:1} {Document social determinants of health affecting pt's care:1} {Document your decision making why or why not admission, treatments were needed:1} Final Clinical Impression(s) / ED Diagnoses Final diagnoses:  None    Rx / DC Orders ED Discharge Orders     None

## 2022-10-12 NOTE — ED Triage Notes (Signed)
Pt states the heart clinic called her earlier and told her she was tachycardic; states she has a defibrillator but did not receive a shock; denies pain, endorses some fatigue and sob with exertion on Saturday

## 2022-10-12 NOTE — Progress Notes (Signed)
ANTICOAGULATION CONSULT NOTE  Pharmacy Consult for Heparin Indication: chest pain/ACS  Allergies  Allergen Reactions   Veltassa [Patiromer] Diarrhea   Darvon Other (See Comments)    indigestion   Mexiletine Hcl     N/V, tremors, difficulty walking, blurred vision   Promethazine Hcl Other (See Comments)    hyperactivity   Ranexa [Ranolazine Er] Diarrhea   Spironolactone Diarrhea and Nausea And Vomiting   Plavix [Clopidogrel Bisulfate] Rash    Patient Measurements: Height: 5\' 3"  (160 cm) Weight: 85.2 kg (187 lb 13.3 oz) IBW/kg (Calculated) : 52.4 Heparin Dosing Weight: 71.9 kg  Vital Signs: Temp: 98.1 F (36.7 C) (07/22 1224) Temp Source: Oral (07/22 1224) BP: 104/57 (07/22 0954) Pulse Rate: 74 (07/22 0954)  Labs: Recent Labs    10/12/22 1156  HGB 11.1*  HCT 35.6*  PLT 200  CREATININE 2.74*  TROPONINIHS 3,748*    Estimated Creatinine Clearance: 18.3 mL/min (A) (by C-G formula based on SCr of 2.74 mg/dL (H)).   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    History of chicken pox    Hyperlipidemia    Hypertension    Ischemic cardiomyopathy    Lumbar spondylosis 01/11/2012   Sleep apnea    Stroke [facility]) 2010   eye doctor said she had TIA   Ventricular tachycardia (HCC)    Polymorphic    Medications:  Medications Prior to Admission  Medication Sig Dispense Refill Last Dose   acetaminophen (TYLENOL) 325 MG tablet Take 650 mg by mouth every 6 (six) hours as needed for moderate pain.      amiodarone (PACERONE) 200 MG tablet Take 1 tablet (200 mg total) by mouth 2 (two) times daily. For 1 month, then decrease to 200mg  once daily 60 tablet 0    Blood Glucose  Monitoring Suppl (ACCU-CHEK NANO SMARTVIEW) W/DEVICE KIT Use to test blood sugar 4 times daily as instructed. Dx code: E11.59 1 kit 0    busPIRone (BUSPAR) 5 MG tablet Take 1 tablet (5 mg total) by mouth 2 (two) times daily. 60 tablet 1    carvedilol (COREG) 12.5 MG tablet Take 1 tablet (12.5 mg total) by mouth 2 (two) times daily with a meal. 60 tablet 1    Cholecalciferol (VITAMIN D3) 2000 UNITS TABS Take 2,000 Units by mouth every morning.      dicyclomine (BENTYL) 10 MG capsule TAKE 1 CAPSULE (10 MG TOTAL) BY MOUTH 4 (FOUR) TIMES DAILY BEFORE MEALS AND AT BEDTIME. 120 capsule 1    Dulaglutide (TRULICITY) 3 MG/0.5ML SOPN Inject 3 mg into the skin once a week. 2 mL 11    ELIQUIS 5 MG TABS tablet TAKE 1 TABLET BY MOUTH TWICE A DAY 60 tablet 11    empagliflozin (JARDIANCE) 10 MG TABS tablet Take 1 tablet (10 mg total) by mouth every morning. 90 tablet 3    fluticasone (FLONASE) 50 MCG/ACT nasal spray Place 1 spray into both nostrils daily as needed for allergies or rhinitis.      furosemide (LASIX) 20 MG tablet TAKE 1 TABLET BY MOUTH EVERY DAY (Patient taking differently: Take 20 mg by mouth 2 (two) times daily.) 90 tablet 1  gabapentin (NEURONTIN) 100 MG capsule Take 2 capsules (200 mg total) by mouth 2 (two) times daily. 360 capsule 3    insulin aspart (NOVOLOG FLEXPEN) 100 UNIT/ML FlexPen Inject up to 15 units daily under skin as advised (Patient taking differently: Inject 0-15 Units into the skin 3 (three) times daily. Sliding scale) 15 mL 11    insulin detemir (LEVEMIR) 100 UNIT/ML injection Inject 12 Units into the skin at bedtime.      levETIRAcetam (KEPPRA) 250 MG tablet TAKE 1 TABLET BY MOUTH TWICE A DAY 60 tablet 1    loperamide (IMODIUM) 2 MG capsule Take 1 capsule (2 mg total) by mouth as needed for diarrhea or loose stools. 30 capsule 0    losartan (COZAAR) 25 MG tablet Take 1 tablet (25 mg total) by mouth daily. 30 tablet 11    magnesium oxide (MAG-OX) 400 (240 Mg) MG tablet TAKE 1  TABLET BY MOUTH TWICE A DAY 180 tablet 1    Multiple Vitamins-Minerals (EYE VITAMINS PO) Take 1 tablet by mouth 2 (two) times daily.      nitroGLYCERIN (NITROSTAT) 0.4 MG SL tablet Place 1 tablet (0.4 mg total) under the tongue every 5 (five) minutes as needed for chest pain. 25 tablet 1    omeprazole (PRILOSEC OTC) 20 MG tablet Take 20 mg by mouth every morning.      rosuvastatin (CRESTOR) 40 MG tablet Take 1 tablet (40 mg total) by mouth daily. NEEDS FOLLOW UP APPOINTMENT FOR MORE REFILLS 90 tablet 0    sertraline (ZOLOFT) 50 MG tablet Take 50 mg by mouth daily.       Assessment: 77 yo F presented to ED after device alert of VT/VF with fatigue, nausea, and upper abdominal discomfort. Patient currently taking apixaban for Afib, last taken at 0830 today(10/12/22). Pharmacy consulted for heparin infusion dosing for ACS and STEMI.  Discussed with EP - with new VF and concerns for possible active ischemia will go ahead and begin IV heparin.  Goal of Therapy:  Heparin level 0.3-0.7 units/ml Monitor platelets by anticoagulation protocol: Yes   Plan:  Heparin 950 units/h no bolus Check aPTT in 8h Daily heparin level, aPTT, CBC  Fredonia Highland, PharmD, BCPS, [facility] Clinical Pharmacist [phone] Please check AMION for all [facility] Pharmacy numbers 10/12/2022

## 2022-10-12 NOTE — Telephone Encounter (Signed)
Following alert received from [employer] received for the following below  Device alert for VT/VF with successful HV therapy. Several days of sustained VT in the VT-1 zone.  -Pt. has had a VT storm 7/19 09:41-7/21 14:38.  -7/21 @ 13:25 pt had VF, rate 279 bpm, HV therapy delivered 25J converting to regular AP/VP, duration 16sec.  -7/21 @ 14:38, second VF event, rate 315 bpm, HV therapy delivered 25J, converting to AP/VP.  Pt also had AF, burden 4.8%, Eliquis per EPIC, presenting rhythm AP/VS.  Called patient who reports not feeling well over the weekend with increased fatigue and dizziness with symptoms still present. Denies chest pain, palpitations or shortness of breath.  Reports compliant with Amiodarone 200 mg BID, Eliquis 5 mg BID and Lasix 20 mg BID. Her MAR list Coreg 12.5 mg BID but patient states she is taking 25 mg BID.   Patient advised to call 911/go to [facility] ED. Patient stated her son was there and she wanted him to take her. Patient advised NOT to drive and states she will get dressed and go now. Message sent to Canary Brim, NP and Dr. Elberta Fortis covering in hospital. Francis Dowse, PA was "offline".

## 2022-10-12 NOTE — Consult Note (Addendum)
ELECTROPHYSIOLOGY CONSULT NOTE    Patient ID: [name] MRN: [id number], DOB/AGE: [birth year] 77 y.o.  Admit date: 10/12/2022 Date of Consult: 10/12/2022  Primary Physician: Oneita Hurt, No Primary Cardiologist: [name], MD  Electrophysiologist: Dr. Elberta Fortis   Referring Provider: Dr. Anitra Lauth  Patient Profile: [name] is a 77 y.o. female with a history of hypothyroidism, HLD, CAD, ICM / chronic systolic CHF (LVEF 25-30%), G1 DD, AF and VT who is being seen today for the evaluation of ventricular tachycardia & fibrillation at the request of Dr. Anitra Lauth.  HPI:  CARLISHA [name] is a 77 y.o. female who presented to the ER with a device alert notifying of VT/VF.   She was recently admitted from 3/21-3/24/24 in VT storm.She had AKI with hyperkalemia.  At that time, she was having VT in zone 1 and 2 and her VT zones were adjusted (zone 1 to 139 and zone 2 to 164). At hospital follow up, she had some nausea in the setting of increased amiodarone.   Admitted 7/12-7/16 > She was started on Ranexa on 09/30/22.  She reported after starting the ranexa, she began having uncontrollable diarrhea which is ongoing. On 7/11, she began feeling dizzy with ambulation, nausea and increased fatigue. She held her Ranexa on 7/11.  Remote check review shows slow VT with cycle length of 480-530.  She indicates she only took one dose of ranexa and then had thin, watery, dark stools. It was so profuse that she had to wear a diaper and was at times unable to make it to the restroom.   On 10/12/22, she was called with a remote device alert indicating VT/VF with successful HV therapy.  On review, she had several days of sustained VT in the V-1 zone.  VT storm from 7/19-7/21.  On 7/19 at 1335, she had VF at rate of 279, HV therapy delivered with 25j converting to AP/VP.  A second episode of VF on 7/21 at 1438 with rate of 315, repeat HV delivery of 25j converting to AP/VS. The patient reported on call that  she had felt fatigued and dizzy. She was instructed to call 911.  The patient elected to have her son taker her to the ER. On arrival the patient reported she was unaware of any delivered shocks. Her son reports she called him around the time of the shocks and was disoriented. He notes it took her several minutes "to come around, she thought she was in the hospital". Labs pending.   She denies chest pain, palpitations, dyspnea, PND, orthopnea, nausea, vomiting, dizziness, syncope, edema, weight gain, or early satiety.   Labs              .    Past Medical History:  Diagnosis Date   Allergy    Arthritis    Atrial fibrillation (HCC)    controlled with amiodarone, on coumadin   Chronic renal insufficiency    Chronic systolic heart failure (HCC)    Coronary artery disease 01/31/2011   Diabetes mellitus    type 1   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    History of chicken pox    Hyperlipidemia    Hypertension    Ischemic cardiomyopathy    Lumbar spondylosis 01/11/2012   Sleep apnea    Stroke [facility]) 2010   eye doctor said she had TIA   Ventricular tachycardia (HCC)    Polymorphic     Surgical History:  Past Surgical History:  Procedure Laterality Date  ABDOMINAL HYSTERECTOMY  1995   CARDIAC DEFIBRILLATOR PLACEMENT     CHOLECYSTECTOMY  1985   COLONOSCOPY WITH PROPOFOL N/A 02/24/2018   Procedure: COLONOSCOPY WITH PROPOFOL;  Surgeon: Rachael Fee, MD;  Location: [facility] ENDOSCOPY;  Service: Endoscopy;  Laterality: N/A;   ICD  2003/2007   implanted by Dr Alanda Amass, most recent generator change 2/13 by Dr Johney Frame, Analyze ST study patient   ICD GENERATOR CHANGEOUT N/A 04/11/2020   Procedure: ICD GENERATOR CHANGEOUT;  Surgeon: Hillis Range, MD;  Location: [facility] INVASIVE CV LAB;  Service: Cardiovascular;  Laterality: N/A;   IMPLANTABLE CARDIOVERTER DEFIBRILLATOR (ICD) GENERATOR CHANGE N/A 05/05/2011   Procedure: ICD GENERATOR CHANGE;  Surgeon: Hillis Range, MD;  Location:  [facility] CATH LAB;  Service: Cardiovascular;  Laterality: N/A;   LEFT HEART CATHETERIZATION WITH CORONARY ANGIOGRAM N/A 01/30/2011   Procedure: LEFT HEART CATHETERIZATION WITH CORONARY ANGIOGRAM;  Surgeon: Laurey Morale, MD;  Location: [facility] CATH LAB;  Service: Cardiovascular;  Laterality: N/A;   POLYPECTOMY  02/24/2018   Procedure: POLYPECTOMY;  Surgeon: Rachael Fee, MD;  Location: [facility] ENDOSCOPY;  Service: Endoscopy;;   Clint Lipps     (Not in a hospital admission)   Inpatient Medications:   amiodarone  150 mg Intravenous Once   [START ON 10/13/2022] aspirin EC  81 mg Oral Daily   busPIRone  5 mg Oral BID   carvedilol  12.5 mg Oral BID WC   dicyclomine  10 mg Oral TID AC & HS   gabapentin  200 mg Oral BID   insulin aspart  0-5 Units Subcutaneous QHS   insulin aspart  0-9 Units Subcutaneous TID WC   insulin detemir  12 Units Subcutaneous QHS   losartan  25 mg Oral Daily   magnesium oxide  400 mg Oral BID   omeprazole  20 mg Oral q morning   rosuvastatin  40 mg Oral Daily   sertraline  50 mg Oral Daily   Vitamin D3  2,000 Units Oral q morning    Allergies:  Allergies  Allergen Reactions   Veltassa [Patiromer] Diarrhea   Darvon Other (See Comments)    indigestion   Mexiletine Hcl     N/V, tremors, difficulty walking, blurred vision   Promethazine Hcl Other (See Comments)    hyperactivity   Ranexa [Ranolazine Er] Diarrhea   Spironolactone Diarrhea and Nausea And Vomiting   Plavix [Clopidogrel Bisulfate] Rash    Family History  Problem Relation Age of Onset   Diabetes Mother    Heart disease Mother    Hyperlipidemia Mother    Hypertension Mother    Heart disease Father    Heart attack Father    Hypertension Father    Breast cancer Sister    Lung cancer Sister    Irritable bowel syndrome Sister    Early death Brother 45   Colon cancer Maternal Aunt    Seizures Granddaughter    Esophageal cancer Neg Hx    Colon polyps Neg Hx      Physical Exam: Vitals:    10/12/22 0954 10/12/22 1100  BP: (!) 104/57   Pulse: 74   Resp: 17   Temp: 98.2 F (36.8 C)   TempSrc: Oral   SpO2: 96%   Weight:  87 kg  Height:  5\' 3"  (1.6 m)    GEN- pleasant elderly female, pale, NAD, A&O x 3, normal affect. Son at bedside.  HEENT: Normocephalic, atraumatic Lungs- CTAB, Normal effort.  Heart- Regular rate and rhythm (Aflutter, VP), No  M/G/R.  GI- Soft, NT, ND.  Extremities- warm, dry. Pedal edema 1+   Radiology/Studies: DG Chest 2 View  Result Date: 10/12/2022 CLINICAL DATA:  Tachycardia EXAM: CHEST - 2 VIEW COMPARISON:  Chest radiograph 06/11/2022 FINDINGS: The left chest wall cardiac device and associated leads are stable. The cardiomediastinal silhouette is stable. There is no focal consolidation or pulmonary edema. There is no pleural effusion or pneumothorax There is no acute osseous abnormality. IMPRESSION: Stable chest with no radiographic evidence of acute cardiopulmonary process. Electronically Signed   By: Lesia Hausen M.D.   On: 10/12/2022 10:53    QMV:HQIONGEX with slow ventricular response, intermittent VP, LAD (personally reviewed)  TELEMETRY: atrial flutter, VP (personally reviewed)  STUDIES:  01/2011 LHC > patent proximal LAD stent, ostial 70% D1 (jailed by stent), mild LAD stent patent, patent RCA stents, EF 40% with global hypokinesis.  06/15/22 ECHO > LVEF 25-30%, global LV hypokinesis, grade I DD, RV systolic function normal, LA mildly dilated, moderate MVR, mod-severe AR   DEVICE HISTORY:  EP Study 2003 > inducible PMVT  Abbott / SJM Dual Chamber ICD, initial implant 2003, 2007.  Gen Change: 2013, 03/2020 - at 03/2020 gen change, SJM SJM Riata ST 7040 lead was fluoroscopically and electrically normal, opted to use this lead rather than replacing it after a long discussion with the patient prior to the procedure  2012 PMVT with ICD shocks 04/2021 appropriate tx for VT with HV therapy   AAD Hx 2007 amiodarone started quickly stopped 2/2  nausea Remotely as well tried on Sotalol and Multaq unclear when/why they were stopped 2012 restarted on amiodarone 2024 Mexiletine intolerant, transitioned to Ranexa   Assessment/Plan:  Recurrent Ventricular Tachycardia / Fibrillation  -admit to ICU given VF & shocks  -assess now labs  -plan for repeat ischemic evaluation given fibrillation  -NPO after MN for [facility] 7/23  -assess troponin  -amiodarone IV load, then infusion   Chronic Systolic CHF, LVEF 25-30% CAD  -ischemic eval as above -ASA 81mg   -continue cozaar, coreg  Atrial Fibrillation  -continue coreg as above -hold home eliquis  -heparin infusion per pharmacy, stop on call to [facility] on 7/23  Secondary Hypercoagulable State  -as above   HLD  -continue crestor   Poorly Controlled DM  Most recent A1c >9  -SSI, sensitive scale  -ACHS -continue levemir 12 units at bedtime   For questions or updates, please contact [facility] Please consult [email or web address] for contact info under Cardiology/STEMI.  Signed, Canary Brim, MSN, APRN, NP-C, AGACNP-BC [facility] - Electrophysiology  10/12/2022, 11:05 AM    I have seen and examined this patient with Canary Brim.  Agree with above, note added to reflect my findings.  Patient presented to the hospital after multiple episodes of VT/VF occurring yesterday.  She did have episodes of syncope and was unaware of ICD shocks.  She has a longstanding history of ventricular tachycardia and is on amiodarone.  She was initially started on Ranexa, but began having diarrhea.  Diarrhea improved with Ranexa stopping.  She did have VT during her hospitalization for diarrhea.  When she awoke from her episode of syncope and VF, she was mildly disoriented.  She currently feels well and is without complaint.  GEN: Well nourished, well developed, in no acute distress  HEENT: normal  Neck: no JVD, carotid bruits, or masses Cardiac: RRR; no murmurs, rubs, or gallops,no edema   Respiratory:  clear to auscultation bilaterally, normal work of breathing GI: soft, nontender, nondistended, +  BS MS: no deformity or atrophy  Skin: warm and dry, device site well healed Neuro:  Strength and sensation are intact Psych: euthymic mood, full affect   VT/VF storm: Patient has had a longstanding history of VT.  She does have chronic systolic heart failure.  Due to her VF, which is a different arrhythmia, we Ayala Ribble plan for left heart catheterization.  Corby Villasenor likely plan for tomorrow if she took her morning medications including Eliquis.  Derick Seminara plan for IV amiodarone in the interim. Chronic systolic heart failure: Ejection fraction 25 to 30%.  Ischemic evaluation as above.  Continue home medications. Poorly controlled diabetes: Sliding scale insulin Hyperlipidemia: Continue Crestor  Yexalen Deike M. Paige Monarrez MD 10/12/2022 11:17 AM

## 2022-10-12 NOTE — TOC Initial Note (Signed)
Transition of Care [facility]) - Initial/Assessment Note    Patient Details  Name: [name] MRN: [id number] Date of Birth: May 24, 1945  Transition of [facility]) CM/SW Contact:    Elliot Cousin, RN Phone Number: [phone] 10/12/2022, 4:25 PM  Clinical Narrative: Spoke to pt and son at bedside. Pt states she lives with son and his girlfriend. Son provides transportation to appts. Pt has RW at home. Son inquired about a scooter. Explained the PCP can assist with scooter or motorized wheelchair. Will continue to follow for dc needs.                  Expected Discharge Plan: Home w Home Health Services Barriers to Discharge: Continued Medical Work up   Patient Goals and CMS Choice Patient states their goals for this hospitalization and ongoing recovery are:: wants to get better CMS Medicare.gov Compare Post Acute Care list provided to:: Patient        Expected Discharge Plan and Services        Prior Living Arrangements/Services     Patient language and need for interpreter reviewed:: Yes Do you feel safe going back to the place where you live?: Yes      Need for Family Participation in Patient Care: Yes (Comment) Care giver support system in place?: Yes (comment)   Criminal Activity/Legal Involvement Pertinent to Current Situation/Hospitalization: No - Comment as needed  Activities of Daily Living Home Assistive Devices/Equipment: Walker (specify type) ADL Screening (condition at time of admission) Patient's cognitive ability adequate to safely complete daily activities?: Yes Is the patient deaf or have difficulty hearing?: No Does the patient have difficulty seeing, even when wearing glasses/contacts?: No Does the patient have difficulty concentrating, remembering, or making decisions?: No Patient able to express need for assistance with ADLs?: No Does the patient have difficulty dressing or bathing?: No Independently performs ADLs?: Yes (appropriate for  developmental age) Does the patient have difficulty walking or climbing stairs?: Yes Weakness of Legs: Both Weakness of Arms/Hands: None  Permission Sought/Granted Permission sought to share information with : Family Supports, Case Manager Permission granted to share information with : Yes, Verbal Permission Granted  Share Information with NAME: Salimah Martinovich     Permission granted to share info w Relationship: son  Permission granted to share info w Contact Information: [phone]  Emotional Assessment Appearance:: Appears stated age Attitude/Demeanor/Rapport: Engaged Affect (typically observed): Accepting Orientation: : Oriented to Self, Oriented to Place, Oriented to  Time, Oriented to Situation   Psych Involvement: No (comment)  Admission diagnosis:  Ventricular fibrillation (HCC) [I49.01] Patient Active Problem List   Diagnosis Date Noted   Ventricular fibrillation (HCC) 10/12/2022   Ventricular tachycardia (HCC) 10/02/2022   Acute kidney injury superimposed on chronic kidney disease (HCC) 10/02/2022   Diarrhea 10/02/2022   Seizure disorder (HCC) 10/02/2022   VT (ventricular tachycardia) (HCC) 06/11/2022   Osteoarthritis 09/01/2021   DM (diabetes mellitus), type 2 with renal complications (HCC) 08/31/2021   Chronic kidney disease, stage 3b (HCC) 08/31/2021   Encounter for well adult exam with abnormal findings 07/30/2018   Polyp of ascending colon    Hypothyroidism 08/02/2017   Personal history of ventricular tachycardia 08/02/2017   Diabetic peripheral neuropathy associated with type 2 diabetes mellitus (HCC) 07/02/2017   Hyperlipidemia 07/02/2017   Hypotension 05/06/2015   Colon cancer screening 12/21/2014   Acute sinus infection 06/06/2014   Allergic rhinitis 06/06/2014   Arthritis of knee, left 04/30/2014   Knee MCL sprain 03/28/2014  Pes anserine bursitis 02/23/2014   Spinal stenosis of lumbar region 02/23/2014   Lower back pain 01/23/2014   Bilateral  knee pain 01/23/2014   Left lateral abdominal pain 01/23/2014   UTI (urinary tract infection) 08/03/2013   Encounter for therapeutic drug monitoring 04/26/2013   Chronic systolic CHF (congestive heart failure) (HCC) 03/20/2013   CKD (chronic kidney disease) 03/20/2013   HTN (hypertension) 12/15/2012   Obesity, unspecified 12/15/2012   Type 2 diabetes, uncontrolled, with circulatory disorder 12/15/2012   Left knee pain 08/30/2012   Lumbar spondylosis 01/11/2012   Coronary artery disease 01/31/2011   Implantable cardioverter-defibrillator (ICD) in situ 01/27/2011     ventricular tachycardia-polymorphic 01/27/2011   Ischemic cardiomyopathy 01/27/2011   PAF (paroxysmal atrial fibrillation) (HCC) 01/27/2011   PCP:  Pcp, No Pharmacy:   [id number], [place] - [address] [place] [address] Phone: [phone] Fax: [phone]     Social Determinants of Health (SDOH) Social History: SDOH Screenings   Food Insecurity: No Food Insecurity (10/12/2022)  Housing: Low Risk  (10/12/2022)  Transportation Needs: Unmet Transportation Needs (10/12/2022)  Utilities: Not At Risk (10/12/2022)  Depression (PHQ2-9): Low Risk  (07/29/2018)  Financial Resource Strain: Patient Declined (07/17/2022)   Received from [facility], [facility]  Physical Activity: Insufficiently Active (07/16/2022)   Received from [facility], [facility]  Social Connections: Socially Integrated (07/16/2022)   Received from [facility], [facility]  Stress: Stress Concern Present (07/16/2022)   Received from [facility], [facility]  Tobacco Use: Medium Risk (10/12/2022)   SDOH Interventions:     Readmission Risk Interventions    10/06/2022   10:37 AM  Readmission Risk Prevention Plan  PCP or Specialist Appt within 5-7 Days Complete  Home Care Screening Complete  Medication Review (RN CM) Complete

## 2022-10-12 NOTE — Progress Notes (Addendum)
ANTICOAGULATION CONSULT NOTE - Initial Consult  Pharmacy Consult for Heparin Indication: chest pain/ACS  Allergies  Allergen Reactions   Veltassa [Patiromer] Diarrhea   Darvon Other (See Comments)    indigestion   Mexiletine Hcl     N/V, tremors, difficulty walking, blurred vision   Promethazine Hcl Other (See Comments)    hyperactivity   Ranexa [Ranolazine Er] Diarrhea   Spironolactone Diarrhea and Nausea And Vomiting   Plavix [Clopidogrel Bisulfate] Rash    Patient Measurements:   Heparin Dosing Weight: 71.9 kg  Vital Signs: Temp: 98.2 F (36.8 C) (07/22 0954) Temp Source: Oral (07/22 0954) BP: 104/57 (07/22 0954) Pulse Rate: 74 (07/22 0954)  Labs: No results for input(s): "HGB", "HCT", "PLT", "APTT", "LABPROT", "INR", "HEPARINUNFRC", "HEPRLOWMOCWT", "CREATININE", "CKTOTAL", "CKMB", "TROPONINIHS" in the last 72 hours.  Estimated Creatinine Clearance: 36 mL/min (A) (by C-G formula based on SCr of 1.41 mg/dL (H)).   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    History of chicken pox    Hyperlipidemia    Hypertension    Ischemic cardiomyopathy    Lumbar spondylosis 01/11/2012   Sleep apnea    Stroke [facility]) 2010   eye doctor said she had TIA   Ventricular tachycardia (HCC)    Polymorphic    Medications:  (Not in a hospital admission)   Assessment: 77 yo F presented to ED after device alert of VT/VF with fatigue, nausea, and upper abdominal discomfort. Patient currently taking apixaban for Afib, last taken at 0830 today(10/12/22). Pharmacy consulted for heparin infusion dosing for ACS and STEMI.  No s/sx of bleeding  Goal of Therapy:  Heparin level 0.3-0.7  units/ml Monitor platelets by anticoagulation protocol: Yes   Plan:  No bolus >> patient on apixaban Start heparin infusion at 950 units/hr at 2030 Left Heart Cath Tomorrow (10/13/22) >> Stop Monitor: Heparin level prior to infusion then, in 8 hours. Monitor daily: s/sx of bleeding, CBC  Caprice Beaver PharmD Candidate 2025 10/12/2022 11:03 AM

## 2022-10-12 NOTE — H&P (Signed)
See Consult Note from 7/22, error in type of note.    Canary Brim, MSN, APRN, NP-C, AGACNP-BC [facility] - Electrophysiology  10/12/2022, 11:10 AM

## 2022-10-12 NOTE — ED Notes (Signed)
ED TO INPATIENT HANDOFF REPORT  ED Nurse Name and Phone #: Vora Clover [phone]  S Name/Age/Gender [name] 77 y.o. female Room/Bed: 035C/035C  Code Status   Code Status: Full Code  Home/SNF/Other Home Patient oriented to: self, place, time, and situation Is this baseline? Yes   Triage Complete: Triage complete  Chief Complaint Ventricular fibrillation [facility]) [I49.01]  Triage Note Pt states the heart clinic called her earlier and told her she was tachycardic; states she has a defibrillator but did not receive a shock; denies pain, endorses some fatigue and sob with exertion on Saturday   Allergies Allergies  Allergen Reactions   Veltassa [Patiromer] Diarrhea   Darvon Other (See Comments)    indigestion   Mexiletine Hcl     N/V, tremors, difficulty walking, blurred vision   Promethazine Hcl Other (See Comments)    hyperactivity   Ranexa [Ranolazine Er] Diarrhea   Spironolactone Diarrhea and Nausea And Vomiting   Plavix [Clopidogrel Bisulfate] Rash    Level of Care/Admitting Diagnosis ED Disposition     ED Disposition  Admit   Condition  --   Comment  Hospital Area: [facility] [[id number]]  Level of Care: ICU [6]  May admit patient to [facility] or [facility] if equivalent level of care is available:: No  Covid Evaluation: Asymptomatic - no recent exposure (last 10 days) testing not required  Diagnosis: Ventricular fibrillation (HCC) [427.41.ICD-9-CM]  Admitting Physician: Regan Lemming [[id number]]  Attending Physician: Regan Lemming [id number]  Bed request comments: 2h  Certification:: I certify this patient will need inpatient services for at least 2 midnights  Estimated Length of Stay: 5          B Medical/Surgery History Past Medical History:  Diagnosis Date   Allergy    Arthritis    Atrial fibrillation (HCC)    controlled with amiodarone, on coumadin   Chronic renal insufficiency    Chronic systolic  heart failure (HCC)    Coronary artery disease 01/31/2011   Diabetes mellitus    type 1   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    History of chicken pox    Hyperlipidemia    Hypertension    Ischemic cardiomyopathy    Lumbar spondylosis 01/11/2012   Sleep apnea    Stroke [facility]) 2010   eye doctor said she had TIA   Ventricular tachycardia (HCC)    Polymorphic   Past Surgical History:  Procedure Laterality Date   ABDOMINAL HYSTERECTOMY  1995   CARDIAC DEFIBRILLATOR PLACEMENT     CHOLECYSTECTOMY  1985   COLONOSCOPY WITH PROPOFOL N/A 02/24/2018   Procedure: COLONOSCOPY WITH PROPOFOL;  Surgeon: Rachael Fee, MD;  Location: [facility] ENDOSCOPY;  Service: Endoscopy;  Laterality: N/A;   ICD  2003/2007   implanted by Dr Alanda Amass, most recent generator change 2/13 by Dr Johney Frame, Analyze ST study patient   ICD GENERATOR CHANGEOUT N/A 04/11/2020   Procedure: ICD GENERATOR CHANGEOUT;  Surgeon: Hillis Range, MD;  Location: [facility] INVASIVE CV LAB;  Service: Cardiovascular;  Laterality: N/A;   IMPLANTABLE CARDIOVERTER DEFIBRILLATOR (ICD) GENERATOR CHANGE N/A 05/05/2011   Procedure: ICD GENERATOR CHANGE;  Surgeon: Hillis Range, MD;  Location: [facility] CATH LAB;  Service: Cardiovascular;  Laterality: N/A;   LEFT HEART CATHETERIZATION WITH CORONARY ANGIOGRAM N/A 01/30/2011   Procedure: LEFT HEART CATHETERIZATION WITH CORONARY ANGIOGRAM;  Surgeon: Laurey Morale, MD;  Location: [facility] CATH LAB;  Service: Cardiovascular;  Laterality: N/A;   POLYPECTOMY  02/24/2018  Procedure: POLYPECTOMY;  Surgeon: Rachael Fee, MD;  Location: [facility] ENDOSCOPY;  Service: Endoscopy;;   TUBALIGATION  1980     A IV Location/Drains/Wounds Patient Lines/Drains/Airways Status     Active Line/Drains/Airways     None            Intake/Output Last 24 hours No intake or output data in the 24 hours ending 10/12/22 1057  Labs/Imaging No results found. However, due to the size of the patient record, not all encounters were  searched. Please check Results Review for a complete set of results. DG Chest 2 View  Result Date: 10/12/2022 CLINICAL DATA:  Tachycardia EXAM: CHEST - 2 VIEW COMPARISON:  Chest radiograph 06/11/2022 FINDINGS: The left chest wall cardiac device and associated leads are stable. The cardiomediastinal silhouette is stable. There is no focal consolidation or pulmonary edema. There is no pleural effusion or pneumothorax There is no acute osseous abnormality. IMPRESSION: Stable chest with no radiographic evidence of acute cardiopulmonary process. Electronically Signed   By: Lesia Hausen M.D.   On: 10/12/2022 10:53    Pending Labs Unresulted Labs (From admission, onward)     Start     Ordered   10/13/22 0500  Basic metabolic panel  Tomorrow morning,   R        10/12/22 1043   10/13/22 0500  CBC  Tomorrow morning,   R        10/12/22 1043   10/12/22 1043  Brain natriuretic peptide  Once,   R        10/12/22 1043   10/12/22 1031  Magnesium  Once,   STAT        10/12/22 1030   10/12/22 0956  Basic metabolic panel  Once,   STAT        10/12/22 0955   10/12/22 0956  CBC  Once,   STAT        10/12/22 0955            Vitals/Pain Today's Vitals   10/12/22 0954 10/12/22 0954  BP:  (!) 104/57  Pulse:  74  Resp:  17  Temp:  98.2 F (36.8 C)  TempSrc:  Oral  SpO2:  96%  PainSc: 0-No pain     Isolation Precautions No active isolations  Medications Medications  amiodarone (NEXTERONE) 1.8 mg/mL load via infusion 150 mg (has no administration in time range)    Followed by  amiodarone (NEXTERONE PREMIX) 360-4.14 MG/200ML-% (1.8 mg/mL) IV infusion (has no administration in time range)    Followed by  amiodarone (NEXTERONE PREMIX) 360-4.14 MG/200ML-% (1.8 mg/mL) IV infusion (has no administration in time range)  nitroGLYCERIN (NITROSTAT) SL tablet 0.4 mg (has no administration in time range)  acetaminophen (TYLENOL) tablet 650 mg (has no administration in time range)  aspirin EC tablet 81  mg (has no administration in time range)  busPIRone (BUSPAR) tablet 5 mg (has no administration in time range)  carvedilol (COREG) tablet 12.5 mg (has no administration in time range)  Vitamin D3 TABS 2,000 Units (has no administration in time range)  dicyclomine (BENTYL) capsule 10 mg (has no administration in time range)  fluticasone (FLONASE) 50 MCG/ACT nasal spray 1 spray (has no administration in time range)  gabapentin (NEURONTIN) capsule 200 mg (has no administration in time range)  losartan (COZAAR) tablet 25 mg (has no administration in time range)  magnesium oxide (MAG-OX) tablet 400 mg (has no administration in time range)  omeprazole (PRILOSEC OTC) EC tablet 20 mg (has  no administration in time range)  rosuvastatin (CRESTOR) tablet 40 mg (has no administration in time range)  sertraline (ZOLOFT) tablet 50 mg (has no administration in time range)    Mobility walks     Focused Assessments    R Recommendations: See Admitting Provider Note  Report given to:   Additional Notes:

## 2022-10-13 DIAGNOSIS — I472 Ventricular tachycardia, unspecified: Secondary | ICD-10-CM | POA: Diagnosis not present

## 2022-10-13 DIAGNOSIS — I4819 Other persistent atrial fibrillation: Secondary | ICD-10-CM | POA: Diagnosis not present

## 2022-10-13 DIAGNOSIS — I5022 Chronic systolic (congestive) heart failure: Secondary | ICD-10-CM

## 2022-10-13 DIAGNOSIS — I4901 Ventricular fibrillation: Secondary | ICD-10-CM | POA: Diagnosis not present

## 2022-10-13 LAB — CBC
HCT: 33.2 % — ABNORMAL LOW (ref 36.0–46.0)
Hemoglobin: 10.7 g/dL — ABNORMAL LOW (ref 12.0–15.0)
MCH: 30 pg (ref 26.0–34.0)
MCHC: 32.2 g/dL (ref 30.0–36.0)
MCV: 93 fL (ref 80.0–100.0)
Platelets: 180 10*3/uL (ref 150–400)
RBC: 3.57 MIL/uL — ABNORMAL LOW (ref 3.87–5.11)
RDW: 16.6 % — ABNORMAL HIGH (ref 11.5–15.5)
WBC: 5.5 10*3/uL (ref 4.0–10.5)
nRBC: 0 % (ref 0.0–0.2)

## 2022-10-13 LAB — BASIC METABOLIC PANEL
Anion gap: 11 (ref 5–15)
BUN: 44 mg/dL — ABNORMAL HIGH (ref 8–23)
CO2: 22 mmol/L (ref 22–32)
Calcium: 7.9 mg/dL — ABNORMAL LOW (ref 8.9–10.3)
Chloride: 104 mmol/L (ref 98–111)
Creatinine, Ser: 2.24 mg/dL — ABNORMAL HIGH (ref 0.44–1.00)
GFR, Estimated: 22 mL/min — ABNORMAL LOW (ref >60)
Glucose, Bld: 154 mg/dL — ABNORMAL HIGH (ref 70–99)
Potassium: 3.7 mmol/L (ref 3.5–5.1)
Sodium: 137 mmol/L (ref 135–145)

## 2022-10-13 LAB — GLUCOSE, CAPILLARY
Glucose-Capillary: 144 mg/dL — ABNORMAL HIGH (ref 70–99)
Glucose-Capillary: 234 mg/dL — ABNORMAL HIGH (ref 70–99)
Glucose-Capillary: 229 mg/dL — ABNORMAL HIGH (ref 70–99)
Glucose-Capillary: 149 mg/dL — ABNORMAL HIGH (ref 70–99)
Glucose-Capillary: 248 mg/dL — ABNORMAL HIGH (ref 70–99)

## 2022-10-13 LAB — HEPARIN LEVEL (UNFRACTIONATED): Heparin Unfractionated: 0.84 IU/mL — ABNORMAL HIGH (ref 0.30–0.70)

## 2022-10-13 LAB — APTT: aPTT: 65 seconds — ABNORMAL HIGH (ref 24–36)

## 2022-10-13 MED ORDER — POTASSIUM CHLORIDE CRYS ER 20 MEQ PO TBCR
40.0000 meq | EXTENDED_RELEASE_TABLET | Freq: Once | ORAL | Status: AC
  Administered 2022-10-13: 40 meq via ORAL
  Filled 2022-10-13: qty 2

## 2022-10-13 MED ORDER — SODIUM CHLORIDE 0.9 % IV SOLN: INTRAVENOUS | Status: DC

## 2022-10-13 NOTE — Progress Notes (Signed)
Heart Failure Navigator Progress Note  Assessed for Heart & Vascular TOC clinic readiness.  Patient does not meet criteria due to Advanced Heart failure Team patient.   Navigator will sign off at this time.   Dawn Fields, BSN, RN Heart Failure Nurse Navigator Secure Chat Only   

## 2022-10-13 NOTE — Progress Notes (Addendum)
Patient Name: [name] Date of Encounter: 10/13/2022  Primary Cardiologist: [name], MD Electrophysiologist: Ledarius Leeson [name], MD  Interval Summary   Pt denies acute complaints.  Still curious about how she could be shocked and unaware.    At this time, the patient denies chest pain, shortness of breath, or any new concerns.  Inpatient Medications    Scheduled Meds:  [START ON 10/14/2022] aspirin EC  81 mg Oral Daily   busPIRone  5 mg Oral BID   carvedilol  12.5 mg Oral BID WC   Chlorhexidine Gluconate Cloth  6 each Topical Daily   cholecalciferol  2,000 Units Oral q morning   dicyclomine  10 mg Oral TID AC & HS   gabapentin  200 mg Oral BID   insulin aspart  0-5 Units Subcutaneous QHS   insulin aspart  0-9 Units Subcutaneous TID WC   insulin detemir  12 Units Subcutaneous QHS   losartan  25 mg Oral Daily   magnesium oxide  400 mg Oral BID   pantoprazole  40 mg Oral q morning   rosuvastatin  40 mg Oral Daily   sertraline  50 mg Oral Daily   Continuous Infusions:  sodium chloride 10 mL/hr at 10/13/22 0645   amiodarone 30 mg/hr (10/13/22 0600)   heparin 950 Units/hr (10/13/22 0600)   PRN Meds: acetaminophen, fluticasone, nitroGLYCERIN, mouth rinse   Vital Signs    Vitals:   10/13/22 0400 10/13/22 0500 10/13/22 0600 10/13/22 0615  BP: 118/65 109/63  (!) 121/55  Pulse: (!) 55 (!) 55 (!) 55 (!) 56  Resp: 18 18 16 20   Temp:      TempSrc:      SpO2: 96% 92% 94% 96%  Weight:   86.3 kg   Height:        Intake/Output Summary (Last 24 hours) at 10/13/2022 0658 Last data filed at 10/13/2022 0600 Gross per 24 hour  Intake 958.86 ml  Output 850 ml  Net 108.86 ml   Filed Weights   10/12/22 1100 10/12/22 1224 10/13/22 0600  Weight: 87 kg 85.2 kg 86.3 kg    Physical Exam    GEN- The patient is well appearing, alert and oriented x 3 today.   Lungs- Clear to ausculation bilaterally, normal work of breathing Cardiac- Regular (VP, underlying AF) rate  and rhythm, no murmurs, rubs or gallops GI- soft, NT, ND, + BS Extremities- warm/dry, no rashes, 1+ pedal edema  Telemetry    VP, AF (personally reviewed)  Hospital Course    [name] is a 77 y.o. female admitted 7/22 for VT/VF storm.  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  Most recently VT/VF on 7/21 s/p ICD shock x2 - she was unaware of events but called son disoriented after episodes.  Ranexa was not tolerated on recent trial due to diarrhea (improved after stopping).  IV amiodarone initiated on admit. No further episodes of sustained VT/shock. AKI resolving. Pending LHC to rule out ischemia.  Assessment & Plan    VT/VF Storm Longstanding hx of VT. VF is new for her, r/o ischemia.  -pending timing of LHC, defer 7/23 due to AKI   -continue IV amiodarone  -NPO after MN for possible cath  -monitor in ICU   Chronic Systolic CHF, LVEF 25-30% CAD  -pending ischemic evaluation as above  -ASA 81mg  every day -continue cozaar, coreg  Atrial Fibrillation  -coreg as above  -hold home eliquis  -heparin infusion, stop on call to cath  lab    Secondary Hypercoagulable State  -anticoagulation as above   AKI  -Trend BMP / urinary output -Replace electrolytes as indicated -Avoid nephrotoxic agents, ensure adequate renal perfusion  HLD  -crestor  Poorly Controlled DM  Hgb A1c >9 -SSI, sensitive scale  -continue home levemir dose  -carb modified, heart healthy diet   Plan for transfer out of ICU to PCU.    For questions or updates, please contact [facility] Please consult [email or web address] for contact info under Cardiology/STEMI.  Signed, [name] Brim, MSN, APRN, NP-C, AGACNP-BC [facility] - Electrophysiology  10/13/2022, 7:04 AM   I have seen and examined this patient with [name].  Agree with above, note added to reflect my findings.  No further VT overnight.  Patient is feeling well without complaint.  GEN: Well nourished, well developed, in  no acute distress  HEENT: normal  Neck: no JVD, carotid bruits, or masses Cardiac: RRR; no murmurs, rubs, or gallops,no edema  Respiratory:  clear to auscultation bilaterally, normal work of breathing GI: soft, nontender, nondistended, + BS MS: no deformity or atrophy  Skin: warm and dry, device site well healed Neuro:  Strength and sensation are intact Psych: euthymic mood, full affect   VT/VF storm: Long hand standing history of VT.  Presented to the hospital with ventricular fibrillation.  On IV amiodarone.  [name] continue amiodarone IV overnight.  [name] need left heart catheterization.  Waiting for creatinine to improve. Chronic systolic heart failure: Ejection fraction 25 to 30%.  No obvious volume overload. Atrial fibrillation: Currently on heparin.  Holding Eliquis for likely catheterization. Acute on chronic renal failure: Likely due to arrhythmias.  Renal function improving. Hyperlipidemia: Continue Crestor Poorly controlled diabetes: Hemoglobin A1c greater than 9.  Continue sliding scale.  Marcile Fuquay M. Jennye Runquist MD 10/13/2022 5:11 PM

## 2022-10-13 NOTE — Plan of Care (Signed)
  Problem: Clinical Measurements: Goal: Diagnostic test results will improve Outcome: Progressing Goal: Respiratory complications will improve Outcome: Progressing Goal: Cardiovascular complication will be avoided Outcome: Progressing   Problem: Activity: Goal: Risk for activity intolerance will decrease Outcome: Progressing   Problem: Nutrition: Goal: Adequate nutrition will be maintained Outcome: Progressing   Problem: Pain Managment: Goal: General experience of comfort will improve Outcome: Progressing   Problem: Safety: Goal: Ability to remain free from injury will improve Outcome: Progressing

## 2022-10-13 NOTE — Progress Notes (Signed)
ANTICOAGULATION CONSULT NOTE  Pharmacy Consult for Heparin Indication: chest pain/ACS  Allergies  Allergen Reactions   Veltassa [Patiromer] Diarrhea   Darvon Other (See Comments)    indigestion   Mexiletine Hcl     N/V, tremors, difficulty walking, blurred vision   Promethazine Hcl Other (See Comments)    hyperactivity   Ranexa [Ranolazine Er] Diarrhea   Spironolactone Diarrhea and Nausea And Vomiting   Plavix [Clopidogrel Bisulfate] Rash    Patient Measurements: Height: 5\' 3"  (160 cm) Weight: 86.3 kg (190 lb 4.8 oz) IBW/kg (Calculated) : 52.4 Heparin Dosing Weight: 71.9 kg  Vital Signs: Temp: 97.7 F (36.5 C) (07/23 0800) Temp Source: Oral (07/23 0800) BP: 124/65 (07/23 1000) Pulse Rate: 63 (07/23 1000)  Labs: Recent Labs    10/12/22 1156 10/12/22 1339 10/13/22 0558 10/13/22 0823 10/13/22 0824  HGB 11.1*  --  10.7*  --   --   HCT 35.6*  --  33.2*  --   --   PLT 200  --  180  --   --   APTT  --   --   --  65*  --   HEPARINUNFRC  --   --   --   --  0.84*  CREATININE 2.74*  --  2.24*  --   --   TROPONINIHS 3,748* 3,205*  --   --   --     Estimated Creatinine Clearance: 22.6 mL/min (A) (by C-G formula based on SCr of 2.24 mg/dL (H)).   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    History of chicken pox    Hyperlipidemia    Hypertension    Ischemic cardiomyopathy    Lumbar spondylosis 01/11/2012   Sleep apnea    Stroke [facility]) 2010   eye doctor said she had TIA   Ventricular tachycardia (HCC)    Polymorphic    Medications:  Medications Prior to Admission  Medication Sig Dispense Refill Last Dose   acetaminophen (TYLENOL) 325 MG tablet Take 650 mg by  mouth every 6 (six) hours as needed for moderate pain.   Past Month   amiodarone (PACERONE) 200 MG tablet Take 1 tablet (200 mg total) by mouth 2 (two) times daily. For 1 month, then decrease to 200mg  once daily 60 tablet 0 10/12/2022   busPIRone (BUSPAR) 5 MG tablet Take 1 tablet (5 mg total) by mouth 2 (two) times daily. 60 tablet 1 10/12/2022   carvedilol (COREG) 12.5 MG tablet Take 1 tablet (12.5 mg total) by mouth 2 (two) times daily with a meal. (Patient taking differently: Take 25 mg by mouth 2 (two) times daily with a meal.) 60 tablet 1 10/12/2022   Cholecalciferol (VITAMIN D3) 2000 UNITS TABS Take 2,000 Units by mouth every morning.   10/12/2022   dicyclomine (BENTYL) 10 MG capsule TAKE 1 CAPSULE (10 MG TOTAL) BY MOUTH 4 (FOUR) TIMES DAILY BEFORE MEALS AND AT BEDTIME. (Patient taking differently: Take 20 mg by mouth 2 (two) times daily.) 120 capsule 1 10/12/2022   Dulaglutide (TRULICITY) 3 MG/0.5ML SOPN Inject 3 mg into the skin once a week. (Patient taking differently: Inject 3 mg into the skin once  a week. Mondays) 2 mL 11 10/05/2022   ELIQUIS 5 MG TABS tablet TAKE 1 TABLET BY MOUTH TWICE A DAY (Patient taking differently: Take 5 mg by mouth 2 (two) times daily.) 60 tablet 11 10/12/2022 at 9 am   fluticasone (FLONASE) 50 MCG/ACT nasal spray Place 1 spray into both nostrils daily as needed for allergies or rhinitis.   09/28/2022   furosemide (LASIX) 20 MG tablet TAKE 1 TABLET BY MOUTH EVERY DAY (Patient taking differently: Take 20 mg by mouth 2 (two) times daily.) 90 tablet 1 10/12/2022   gabapentin (NEURONTIN) 100 MG capsule Take 2 capsules (200 mg total) by mouth 2 (two) times daily. 360 capsule 3 10/12/2022   insulin aspart (NOVOLOG FLEXPEN) 100 UNIT/ML FlexPen Inject up to 15 units daily under skin as advised (Patient taking differently: Inject 0-15 Units into the skin 3 (three) times daily. Sliding scale) 15 mL 11 10/11/2022   insulin detemir (LEVEMIR) 100 UNIT/ML injection Inject 12 Units into the  skin at bedtime.   10/11/2022   levETIRAcetam (KEPPRA) 250 MG tablet TAKE 1 TABLET BY MOUTH TWICE A DAY 60 tablet 1 10/12/2022   losartan (COZAAR) 25 MG tablet Take 1 tablet (25 mg total) by mouth daily. (Patient taking differently: Take 25 mg by mouth every evening.) 30 tablet 11 10/11/2022   magnesium oxide (MAG-OX) 400 (240 Mg) MG tablet TAKE 1 TABLET BY MOUTH TWICE A DAY (Patient taking differently: Take 400 mg by mouth 2 (two) times daily.) 180 tablet 1 10/11/2022   Multiple Vitamins-Minerals (EYE VITAMINS PO) Take 1 tablet by mouth 2 (two) times daily.   10/12/2022   nitroGLYCERIN (NITROSTAT) 0.4 MG SL tablet Place 1 tablet (0.4 mg total) under the tongue every 5 (five) minutes as needed for chest pain. 25 tablet 1 unk last dose   omeprazole (PRILOSEC OTC) 20 MG tablet Take 20 mg by mouth every morning.   10/12/2022   rosuvastatin (CRESTOR) 40 MG tablet Take 1 tablet (40 mg total) by mouth daily. NEEDS FOLLOW UP APPOINTMENT FOR MORE REFILLS (Patient taking differently: Take 40 mg by mouth every evening.) 90 tablet 0 10/11/2022   sertraline (ZOLOFT) 50 MG tablet Take 50 mg by mouth at bedtime.   10/11/2022   Blood Glucose Monitoring Suppl (ACCU-CHEK NANO SMARTVIEW) W/DEVICE KIT Use to test blood sugar 4 times daily as instructed. Dx code: E11.59 1 kit 0    empagliflozin (JARDIANCE) 10 MG TABS tablet Take 1 tablet (10 mg total) by mouth every morning. 90 tablet 3    loperamide (IMODIUM) 2 MG capsule Take 1 capsule (2 mg total) by mouth as needed for diarrhea or loose stools. (Patient taking differently: Take 2 mg by mouth daily as needed for diarrhea or loose stools.) 30 capsule 0     Assessment: 77 yo F presented to ED after device alert of VT/VF with fatigue, nausea, and upper abdominal discomfort. Patient currently taking apixaban for Afib, last taken at 0830 today(10/12/22). Pharmacy consulted for heparin infusion dosing for ACS and STEMI.  Heparin level 0.85 due to DOAC use, aPTT just below goal at  65 seconds, CBC stable.  Goal of Therapy:  Heparin level 0.3-0.7 units/ml aPTT 66-102 seconds Monitor platelets by anticoagulation protocol: Yes   Plan:  Heparin to 1100 units/h Daily aPTT, heparin level, CBC  Fredonia Highland, PharmD, [place], [facility] Clinical Pharmacist [phone] Please check AMION for all [facility] Pharmacy numbers 10/13/2022

## 2022-10-14 ENCOUNTER — Encounter (HOSPITAL_COMMUNITY)
Admission: EM | Disposition: A | Discharge status: Home / Self Care | Payer: Self-pay | Source: Home / Self Care | Attending: Cardiology

## 2022-10-14 ENCOUNTER — Other Ambulatory Visit: Payer: Self-pay | Admitting: Neurology

## 2022-10-14 ENCOUNTER — Ambulatory Visit: Payer: Medicare HMO

## 2022-10-14 DIAGNOSIS — I2510 Atherosclerotic heart disease of native coronary artery without angina pectoris: Secondary | ICD-10-CM | POA: Diagnosis not present

## 2022-10-14 DIAGNOSIS — I5022 Chronic systolic (congestive) heart failure: Secondary | ICD-10-CM

## 2022-10-14 DIAGNOSIS — I4901 Ventricular fibrillation: Secondary | ICD-10-CM | POA: Diagnosis not present

## 2022-10-14 DIAGNOSIS — I472 Ventricular tachycardia, unspecified: Secondary | ICD-10-CM | POA: Diagnosis not present

## 2022-10-14 DIAGNOSIS — I4819 Other persistent atrial fibrillation: Secondary | ICD-10-CM | POA: Diagnosis not present

## 2022-10-14 HISTORY — PX: LEFT HEART CATH AND CORONARY ANGIOGRAPHY: CATH118249

## 2022-10-14 LAB — BASIC METABOLIC PANEL
Anion gap: 10 (ref 5–15)
BUN: 34 mg/dL — ABNORMAL HIGH (ref 8–23)
CO2: 23 mmol/L (ref 22–32)
Calcium: 8.2 mg/dL — ABNORMAL LOW (ref 8.9–10.3)
Chloride: 104 mmol/L (ref 98–111)
Creatinine, Ser: 1.88 mg/dL — ABNORMAL HIGH (ref 0.44–1.00)
GFR, Estimated: 28 mL/min — ABNORMAL LOW (ref >60)
Glucose, Bld: 154 mg/dL — ABNORMAL HIGH (ref 70–99)
Potassium: 4.4 mmol/L (ref 3.5–5.1)
Sodium: 137 mmol/L (ref 135–145)

## 2022-10-14 LAB — CBC
HCT: 31.4 % — ABNORMAL LOW (ref 36.0–46.0)
Hemoglobin: 10.1 g/dL — ABNORMAL LOW (ref 12.0–15.0)
MCH: 28.7 pg (ref 26.0–34.0)
MCHC: 32.2 g/dL (ref 30.0–36.0)
MCV: 89.2 fL (ref 80.0–100.0)
Platelets: 209 10*3/uL (ref 150–400)
RBC: 3.52 MIL/uL — ABNORMAL LOW (ref 3.87–5.11)
RDW: 16.5 % — ABNORMAL HIGH (ref 11.5–15.5)
WBC: 5.8 10*3/uL (ref 4.0–10.5)
nRBC: 0 % (ref 0.0–0.2)

## 2022-10-14 LAB — GLUCOSE, CAPILLARY
Glucose-Capillary: 132 mg/dL — ABNORMAL HIGH (ref 70–99)
Glucose-Capillary: 107 mg/dL — ABNORMAL HIGH (ref 70–99)
Glucose-Capillary: 112 mg/dL — ABNORMAL HIGH (ref 70–99)
Glucose-Capillary: 173 mg/dL — ABNORMAL HIGH (ref 70–99)

## 2022-10-14 LAB — MAGNESIUM: Magnesium: 2.1 mg/dL (ref 1.7–2.4)

## 2022-10-14 LAB — HEPARIN LEVEL (UNFRACTIONATED): Heparin Unfractionated: 0.69 IU/mL (ref 0.30–0.70)

## 2022-10-14 LAB — APTT: aPTT: 101 seconds — ABNORMAL HIGH (ref 24–36)

## 2022-10-14 SURGERY — LEFT HEART CATH AND CORONARY ANGIOGRAPHY
Anesthesia: LOCAL

## 2022-10-14 MED ORDER — INSULIN DETEMIR 100 UNIT/ML ~~LOC~~ SOLN
14.0000 [IU] | Freq: Every day | SUBCUTANEOUS | Status: DC
  Administered 2022-10-14 – 2022-10-15 (×2): 14 [IU] via SUBCUTANEOUS
  Filled 2022-10-14 (×3): qty 0.14

## 2022-10-14 MED ORDER — IOHEXOL 350 MG/ML SOLN
INTRAVENOUS | Status: DC | PRN
  Administered 2022-10-14: 84 mL

## 2022-10-14 MED ORDER — SODIUM CHLORIDE 0.9 % IV SOLN: INTRAVENOUS | Status: AC

## 2022-10-14 MED ORDER — VERAPAMIL HCL 2.5 MG/ML IV SOLN
INTRAVENOUS | Status: DC | PRN
  Administered 2022-10-14: 10 mL via INTRA_ARTERIAL

## 2022-10-14 MED ORDER — SODIUM CHLORIDE 0.9% FLUSH
3.0000 mL | Freq: Two times a day (BID) | INTRAVENOUS | Status: DC
  Administered 2022-10-15 – 2022-10-16 (×3): 3 mL via INTRAVENOUS

## 2022-10-14 MED ORDER — FENTANYL CITRATE (PF) 100 MCG/2ML IJ SOLN
INTRAMUSCULAR | Status: DC | PRN
  Administered 2022-10-14: 25 ug via INTRAVENOUS

## 2022-10-14 MED ORDER — SODIUM CHLORIDE 0.9 % IV SOLN: 250.0000 mL | INTRAVENOUS | Status: DC | PRN

## 2022-10-14 MED ORDER — LABETALOL HCL 5 MG/ML IV SOLN: 10.0000 mg | INTRAVENOUS | Status: AC | PRN

## 2022-10-14 MED ORDER — MIDAZOLAM HCL 2 MG/2ML IJ SOLN
INTRAMUSCULAR | Status: DC | PRN
  Administered 2022-10-14: 1 mg via INTRAVENOUS

## 2022-10-14 MED ORDER — ASPIRIN 81 MG PO CHEW
81.0000 mg | CHEWABLE_TABLET | ORAL | Status: AC
  Administered 2022-10-14: 81 mg via ORAL
  Filled 2022-10-14: qty 1

## 2022-10-14 MED ORDER — HYDRALAZINE HCL 20 MG/ML IJ SOLN: 10.0000 mg | INTRAMUSCULAR | Status: AC | PRN

## 2022-10-14 MED ORDER — FENTANYL CITRATE (PF) 100 MCG/2ML IJ SOLN
INTRAMUSCULAR | Status: AC
  Filled 2022-10-14: qty 2

## 2022-10-14 MED ORDER — LIDOCAINE HCL (PF) 1 % IJ SOLN
INTRAMUSCULAR | Status: DC | PRN
  Administered 2022-10-14: 2 mL

## 2022-10-14 MED ORDER — HEPARIN SODIUM (PORCINE) 1000 UNIT/ML IJ SOLN
INTRAMUSCULAR | Status: AC
  Filled 2022-10-14: qty 10

## 2022-10-14 MED ORDER — MIDAZOLAM HCL 2 MG/2ML IJ SOLN
INTRAMUSCULAR | Status: AC
  Filled 2022-10-14: qty 2

## 2022-10-14 MED ORDER — SODIUM CHLORIDE 0.9% FLUSH: 3.0000 mL | INTRAVENOUS | Status: DC | PRN

## 2022-10-14 MED ORDER — VERAPAMIL HCL 2.5 MG/ML IV SOLN
INTRAVENOUS | Status: AC
  Filled 2022-10-14: qty 2

## 2022-10-14 MED ORDER — HEPARIN SODIUM (PORCINE) 1000 UNIT/ML IJ SOLN
INTRAMUSCULAR | Status: DC | PRN
  Administered 2022-10-14: 4500 [IU] via INTRAVENOUS

## 2022-10-14 MED ORDER — LIDOCAINE HCL (PF) 1 % IJ SOLN
INTRAMUSCULAR | Status: AC
  Filled 2022-10-14: qty 30

## 2022-10-14 MED ORDER — HEPARIN (PORCINE) IN NACL 1000-0.9 UT/500ML-% IV SOLN
INTRAVENOUS | Status: DC | PRN
  Administered 2022-10-14 (×2): 500 mL

## 2022-10-14 SURGICAL SUPPLY — 14 items
CATH 5FR JL3.5 JR4 ANG PIG MP (CATHETERS) IMPLANT
CATH INFINITI 5 FR AL2 (CATHETERS) IMPLANT
CATH LAUNCHER 5F EBU3.0 (CATHETERS) IMPLANT
CATH VISTA GUIDE 6FR XBLAD3.5 (CATHETERS) IMPLANT
CATHETER LAUNCHER 5F EBU3.0 (CATHETERS) ×1 IMPLANT
DEVICE RAD COMP TR BAND LRG (VASCULAR PRODUCTS) IMPLANT
GLIDESHEATH SLEND SS 6F .021 (SHEATH) IMPLANT
GUIDEWIRE INQWIRE 1.5J.035X260 (WIRE) IMPLANT
INQWIRE 1.5J .035X260CM (WIRE) ×1 IMPLANT
PACK CARDIAC CATHETERIZATION (CUSTOM PROCEDURE TRAY) ×1 IMPLANT
PROTECTION STATION PRESSURIZED (MISCELLANEOUS) ×1 IMPLANT
SET ATX-X65L (MISCELLANEOUS) IMPLANT
SHEATH PROBE COVER 6X72 (BAG) IMPLANT
STATION PROTECTION PRESSURIZED (MISCELLANEOUS) IMPLANT

## 2022-10-14 NOTE — H&P (View-Only) (Signed)
Patient Name: [name] Date of Encounter: 10/14/2022  Primary Cardiologist: [name], MD Electrophysiologist:  [name], MD  Interval Summary   The patient denies acute complaints.    Inpatient Medications    Scheduled Meds:  aspirin EC  81 mg Oral Daily   busPIRone  5 mg Oral BID   carvedilol  12.5 mg Oral BID WC   Chlorhexidine Gluconate Cloth  6 each Topical Daily   cholecalciferol  2,000 Units Oral q morning   dicyclomine  10 mg Oral TID AC & HS   gabapentin  200 mg Oral BID   insulin aspart  0-5 Units Subcutaneous QHS   insulin aspart  0-9 Units Subcutaneous TID WC   insulin detemir  12 Units Subcutaneous QHS   magnesium oxide  400 mg Oral BID   pantoprazole  40 mg Oral q morning   rosuvastatin  40 mg Oral Daily   sertraline  50 mg Oral Daily   Continuous Infusions:  sodium chloride 10 mL/hr at 10/13/22 1200   amiodarone 30 mg/hr (10/14/22 0139)   heparin 1,100 Units/hr (10/13/22 1554)   PRN Meds: acetaminophen, fluticasone, nitroGLYCERIN, mouth rinse   Vital Signs    Vitals:   10/13/22 1631 10/13/22 1944 10/13/22 2311 10/14/22 0333  BP: 117/63 (!) 123/51 130/64 131/61  Pulse: (!) 54   63  Resp: 20 20 (!) 22 18  Temp:  98.5 F (36.9 C) 98.9 F (37.2 C) 97.8 F (36.6 C)  TempSrc: Oral Oral Oral Oral  SpO2:  96% 94% 96%  Weight:      Height:        Intake/Output Summary (Last 24 hours) at 10/14/2022 0703 Last data filed at 10/14/2022 0400 Gross per 24 hour  Intake 857.85 ml  Output --  Net 857.85 ml   Filed Weights   10/12/22 1100 10/12/22 1224 10/13/22 0600  Weight: 87 kg 85.2 kg 86.3 kg    Physical Exam    GEN- The patient is well appearing, alert and oriented x 3 today.   Lungs- Clear to ausculation bilaterally, normal work of breathing Cardiac- Regular rate and rhythm (AP, AP/VP), no murmurs, rubs or gallops GI- soft, NT, ND, + BS Extremities- no clubbing or cyanosis. No edema  Telemetry    SR, AP, AP/VP 60's  (personally reviewed). No episodes of PVC's or NSVT.   Hospital Course    [name] is a 77 y.o. female admitted 7/22 for VT/VF storm.  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  Most recently VT/VF on 7/21 s/p ICD shock x2 - she was unaware of events but called son disoriented after episodes.  Ranexa was not tolerated on recent trial due to diarrhea (improved after stopping).  IV amiodarone initiated on admit. No further episodes of sustained VT/shock. AKI resolving. Pending LHC to rule out ischemia.   Assessment & Plan    VT/VF Storm Longstanding hx of VT. VF is new for her, r/o ischemia.  -await recovery of renal function / pending timing of LHC -continue IV amiodarone 30 mg/hr  -NPO for now until timing of LHC determined  -tele monitoring    Chronic Systolic CHF, LVEF 25-30% CAD  -ASA 81 mg every day  -continue coreg  -pending ischemic evaluation   Atrial Fibrillation  -continue heparin infusion, stop on call to cath lab -hold home eliquis  -coreg    Secondary Hypercoagulable State  -anticoagulation as above     AKI  -Trend BMP / urinary output -  Replace electrolytes as indicated -Avoid nephrotoxic agents, ensure adequate renal perfusion  HLD  -crestor    Poorly Controlled DM  Hgb A1c >9 -carb modified, heart healthy diet  -SSI, sensitive scale  -change levemir to 14 units  -3 units meal coverage   For questions or updates, please contact [facility] Please consult [email or web address] for contact info under Cardiology/STEMI.  Signed, Canary Brim, MSN, APRN, NP-C, AGACNP-BC [facility] - Electrophysiology  10/14/2022, 7:04 AM  I have seen and examined this patient with Canary Brim.  Agree with above, note added to reflect my findings.  Feeling well without complaint.  No further ventricular tachycardia  GEN: Well nourished, well developed, in no acute distress  HEENT: normal  Neck: no JVD, carotid bruits, or masses Cardiac: Irregular ; no  murmurs, rubs, or gallops,no edema  Respiratory:  clear to auscultation bilaterally, normal work of breathing GI: soft, nontender, nondistended, + BS MS: no deformity or atrophy  Skin: warm and dry, device site well healed Neuro:  Strength and sensation are intact Psych: euthymic mood, full affect   VT/VF storm: Longstanding history of VT.  On amiodarone IV.  No further ventricular tachycardia.  Does have a history of coronary artery disease.  Plan for left heart catheterization per interventional cardiology timing.  Kidney function improving. Chronic systolic heart failure: Ejection fraction 25 to 30%.  Pending ischemic evaluation. Persistent atrial fibrillation: Currently on heparin.   restart Eliquis after ischemic evaluation Acute renal failure: Creatinine improving.  Likely due to VT storm Poorly controlled diabetes: Continue sliding scale insulin and home medications   M.  MD 10/14/2022 12:02 PM

## 2022-10-14 NOTE — Progress Notes (Signed)
ANTICOAGULATION CONSULT NOTE  Pharmacy Consult for Heparin Indication: chest pain/ACS  Allergies  Allergen Reactions   Veltassa [Patiromer] Diarrhea   Darvon Other (See Comments)    indigestion   Mexiletine Hcl     N/V, tremors, difficulty walking, blurred vision   Promethazine Hcl Other (See Comments)    hyperactivity   Ranexa [Ranolazine Er] Diarrhea   Spironolactone Diarrhea and Nausea And Vomiting   Plavix [Clopidogrel Bisulfate] Rash    Patient Measurements: Height: 5\' 3"  (160 cm) Weight: 86.3 kg (190 lb 4.8 oz) IBW/kg (Calculated) : 52.4 Heparin Dosing Weight: 71.9 kg  Vital Signs: Temp: 97.8 F (36.6 C) (07/24 0740) Temp Source: Oral (07/24 0740) BP: 128/62 (07/24 0740) Pulse Rate: 63 (07/24 0333)  Labs: Recent Labs    10/12/22 1156 10/12/22 1339 10/13/22 0558 10/13/22 0823 10/13/22 0824 10/14/22 0329  HGB 11.1*  --  10.7*  --   --  10.1*  HCT 35.6*  --  33.2*  --   --  31.4*  PLT 200  --  180  --   --  209  APTT  --   --   --  65*  --  101*  HEPARINUNFRC  --   --   --   --  0.84* 0.69  CREATININE 2.74*  --  2.24*  --   --  1.88*  TROPONINIHS 3,748* 3,205*  --   --   --   --     Estimated Creatinine Clearance: 26.9 mL/min (A) (by C-G formula based on SCr of 1.88 mg/dL (H)).   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    History of chicken pox    Hyperlipidemia    Hypertension    Ischemic cardiomyopathy    Lumbar spondylosis 01/11/2012   Sleep apnea    Stroke [facility]) 2010   eye doctor said she had TIA   Ventricular tachycardia (HCC)    Polymorphic    Medications:  Medications Prior to Admission  Medication Sig Dispense Refill Last Dose    acetaminophen (TYLENOL) 325 MG tablet Take 650 mg by mouth every 6 (six) hours as needed for moderate pain.   Past Month   amiodarone (PACERONE) 200 MG tablet Take 1 tablet (200 mg total) by mouth 2 (two) times daily. For 1 month, then decrease to 200mg  once daily 60 tablet 0 10/12/2022   busPIRone (BUSPAR) 5 MG tablet Take 1 tablet (5 mg total) by mouth 2 (two) times daily. 60 tablet 1 10/12/2022   carvedilol (COREG) 12.5 MG tablet Take 1 tablet (12.5 mg total) by mouth 2 (two) times daily with a meal. (Patient taking differently: Take 25 mg by mouth 2 (two) times daily with a meal.) 60 tablet 1 10/12/2022   Cholecalciferol (VITAMIN D3) 2000 UNITS TABS Take 2,000 Units by mouth every morning.   10/12/2022   dicyclomine (BENTYL) 10 MG capsule TAKE 1 CAPSULE (10 MG TOTAL) BY MOUTH 4 (FOUR) TIMES DAILY BEFORE MEALS AND AT BEDTIME. (Patient taking differently: Take 20 mg by mouth 2 (two) times daily.) 120 capsule 1 10/12/2022   Dulaglutide (TRULICITY) 3 MG/0.5ML SOPN Inject 3 mg into the skin once a  week. (Patient taking differently: Inject 3 mg into the skin once a week. Mondays) 2 mL 11 10/05/2022   ELIQUIS 5 MG TABS tablet TAKE 1 TABLET BY MOUTH TWICE A DAY (Patient taking differently: Take 5 mg by mouth 2 (two) times daily.) 60 tablet 11 10/12/2022 at 9 am   fluticasone (FLONASE) 50 MCG/ACT nasal spray Place 1 spray into both nostrils daily as needed for allergies or rhinitis.   09/28/2022   furosemide (LASIX) 20 MG tablet TAKE 1 TABLET BY MOUTH EVERY DAY (Patient taking differently: Take 20 mg by mouth 2 (two) times daily.) 90 tablet 1 10/12/2022   gabapentin (NEURONTIN) 100 MG capsule Take 2 capsules (200 mg total) by mouth 2 (two) times daily. 360 capsule 3 10/12/2022   insulin aspart (NOVOLOG FLEXPEN) 100 UNIT/ML FlexPen Inject up to 15 units daily under skin as advised (Patient taking differently: Inject 0-15 Units into the skin 3 (three) times daily. Sliding scale) 15 mL 11 10/11/2022   insulin detemir  (LEVEMIR) 100 UNIT/ML injection Inject 12 Units into the skin at bedtime.   10/11/2022   losartan (COZAAR) 25 MG tablet Take 1 tablet (25 mg total) by mouth daily. (Patient taking differently: Take 25 mg by mouth every evening.) 30 tablet 11 10/11/2022   magnesium oxide (MAG-OX) 400 (240 Mg) MG tablet TAKE 1 TABLET BY MOUTH TWICE A DAY (Patient taking differently: Take 400 mg by mouth 2 (two) times daily.) 180 tablet 1 10/11/2022   Multiple Vitamins-Minerals (EYE VITAMINS PO) Take 1 tablet by mouth 2 (two) times daily.   10/12/2022   nitroGLYCERIN (NITROSTAT) 0.4 MG SL tablet Place 1 tablet (0.4 mg total) under the tongue every 5 (five) minutes as needed for chest pain. 25 tablet 1 unk last dose   omeprazole (PRILOSEC OTC) 20 MG tablet Take 20 mg by mouth every morning.   10/12/2022   rosuvastatin (CRESTOR) 40 MG tablet Take 1 tablet (40 mg total) by mouth daily. NEEDS FOLLOW UP APPOINTMENT FOR MORE REFILLS (Patient taking differently: Take 40 mg by mouth every evening.) 90 tablet 0 10/11/2022   sertraline (ZOLOFT) 50 MG tablet Take 50 mg by mouth at bedtime.   10/11/2022   Blood Glucose Monitoring Suppl (ACCU-CHEK NANO SMARTVIEW) W/DEVICE KIT Use to test blood sugar 4 times daily as instructed. Dx code: E11.59 1 kit 0    empagliflozin (JARDIANCE) 10 MG TABS tablet Take 1 tablet (10 mg total) by mouth every morning. 90 tablet 3    loperamide (IMODIUM) 2 MG capsule Take 1 capsule (2 mg total) by mouth as needed for diarrhea or loose stools. (Patient taking differently: Take 2 mg by mouth daily as needed for diarrhea or loose stools.) 30 capsule 0     Assessment: 77 yo F presented to ED after device alert of VT/VF with fatigue, nausea, and upper abdominal discomfort. Patient taking apixaban for Afib PTA, last taken at 0830 on 7/22. Pharmacy consulted for heparin infusion dosing for ACS and STEMI.  Heparin level 0.69, therapeutic. aPTT 101, therapeutic. Levels correlate, therefore will discontinue aPTT levels  moving forward. Will slightly decrease heparin infusion as both levels are at top of therapeutic range.  Hgb 10.1, plt 209. No issues with infusion or s/sx of bleeding per RN.  Goal of Therapy:  Heparin level 0.3-0.7 units/ml Monitor platelets by anticoagulation protocol: Yes   Plan:  Decrease heparin slightly to 1050 units/hr Monitor daily heparin level, CBC, s/sx of bleeding F/u cath plans & plan to resume Eliquis  Shanda Bumps  Baldo Daub, PharmD PGY1 Pharmacy Resident 10/14/2022 11:09 AM

## 2022-10-14 NOTE — Plan of Care (Signed)
  Problem: Education: Goal: Understanding of CV disease, CV risk reduction, and recovery process will improve Outcome: Progressing Goal: Individualized Educational Video(s) Outcome: Progressing   Problem: Activity: Goal: Ability to return to baseline activity level will improve Outcome: Progressing   Problem: Cardiovascular: Goal: Ability to achieve and maintain adequate cardiovascular perfusion will improve Outcome: Progressing   Problem: Education: Goal: Ability to describe self-care measures that may prevent or decrease complications (Diabetes Survival Skills Education) will improve Outcome: Progressing   Problem: Coping: Goal: Ability to adjust to condition or change in health will improve Outcome: Progressing   Problem: Fluid Volume: Goal: Ability to maintain a balanced intake and output will improve Outcome: Progressing

## 2022-10-14 NOTE — Progress Notes (Addendum)
Patient Name: SIOBHAN ZARO Date of Encounter: 10/14/2022  Primary Cardiologist: [name], MD Electrophysiologist: Shereen Marton [name], MD  Interval Summary   The patient denies acute complaints.    Inpatient Medications    Scheduled Meds:  aspirin EC  81 mg Oral Daily   busPIRone  5 mg Oral BID   carvedilol  12.5 mg Oral BID WC   Chlorhexidine Gluconate Cloth  6 each Topical Daily   cholecalciferol  2,000 Units Oral q morning   dicyclomine  10 mg Oral TID AC & HS   gabapentin  200 mg Oral BID   insulin aspart  0-5 Units Subcutaneous QHS   insulin aspart  0-9 Units Subcutaneous TID WC   insulin detemir  12 Units Subcutaneous QHS   magnesium oxide  400 mg Oral BID   pantoprazole  40 mg Oral q morning   rosuvastatin  40 mg Oral Daily   sertraline  50 mg Oral Daily   Continuous Infusions:  sodium chloride 10 mL/hr at 10/13/22 1200   amiodarone 30 mg/hr (10/14/22 0139)   heparin 1,100 Units/hr (10/13/22 1554)   PRN Meds: acetaminophen, fluticasone, nitroGLYCERIN, mouth rinse   Vital Signs    Vitals:   10/13/22 1631 10/13/22 1944 10/13/22 2311 10/14/22 0333  BP: 117/63 (!) 123/51 130/64 131/61  Pulse: (!) 54   63  Resp: 20 20 (!) 22 18  Temp:  98.5 F (36.9 C) 98.9 F (37.2 C) 97.8 F (36.6 C)  TempSrc: Oral Oral Oral Oral  SpO2:  96% 94% 96%  Weight:      Height:        Intake/Output Summary (Last 24 hours) at 10/14/2022 0703 Last data filed at 10/14/2022 0400 Gross per 24 hour  Intake 857.85 ml  Output --  Net 857.85 ml   Filed Weights   10/12/22 1100 10/12/22 1224 10/13/22 0600  Weight: 87 kg 85.2 kg 86.3 kg    Physical Exam    GEN- The patient is well appearing, alert and oriented x 3 today.   Lungs- Clear to ausculation bilaterally, normal work of breathing Cardiac- Regular rate and rhythm (AP, AP/VP), no murmurs, rubs or gallops GI- soft, NT, ND, + BS Extremities- no clubbing or cyanosis. No edema  Telemetry    SR, AP, AP/VP 60's  (personally reviewed). No episodes of PVC's or NSVT.   Hospital Course    AIYANNAH FAYAD is a 77 y.o. female admitted 7/22 for VT/VF storm.  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  Most recently VT/VF on 7/21 s/p ICD shock x2 - she was unaware of events but called son disoriented after episodes.  Ranexa was not tolerated on recent trial due to diarrhea (improved after stopping).  IV amiodarone initiated on admit. No further episodes of sustained VT/shock. AKI resolving. Pending LHC to rule out ischemia.   Assessment & Plan    VT/VF Storm Longstanding hx of VT. VF is new for her, r/o ischemia.  -await recovery of renal function / pending timing of LHC -continue IV amiodarone 30 mg/hr  -NPO for now until timing of LHC determined  -tele monitoring    Chronic Systolic CHF, LVEF 25-30% CAD  -ASA 81 mg every day  -continue coreg  -pending ischemic evaluation   Atrial Fibrillation  -continue heparin infusion, stop on call to cath lab -hold home eliquis  -coreg    Secondary Hypercoagulable State  -anticoagulation as above     AKI  -Trend BMP / urinary output -  Replace electrolytes as indicated -Avoid nephrotoxic agents, ensure adequate renal perfusion  HLD  -crestor    Poorly Controlled DM  Hgb A1c >9 -carb modified, heart healthy diet  -SSI, sensitive scale  -change levemir to 14 units  -3 units meal coverage   For questions or updates, please contact [facility] Please consult [email or web address] for contact info under Cardiology/STEMI.  Signed, Canary Brim, MSN, APRN, NP-C, AGACNP-BC [facility] - Electrophysiology  10/14/2022, 7:04 AM  I have seen and examined this patient with Canary Brim.  Agree with above, note added to reflect my findings.  Feeling well without complaint.  No further ventricular tachycardia  GEN: Well nourished, well developed, in no acute distress  HEENT: normal  Neck: no JVD, carotid bruits, or masses Cardiac: Irregular ; no  murmurs, rubs, or gallops,no edema  Respiratory:  clear to auscultation bilaterally, normal work of breathing GI: soft, nontender, nondistended, + BS MS: no deformity or atrophy  Skin: warm and dry, device site well healed Neuro:  Strength and sensation are intact Psych: euthymic mood, full affect   VT/VF storm: Longstanding history of VT.  On amiodarone IV.  No further ventricular tachycardia.  Does have a history of coronary artery disease.  Plan for left heart catheterization per interventional cardiology timing.  Kidney function improving. Chronic systolic heart failure: Ejection fraction 25 to 30%.  Pending ischemic evaluation. Persistent atrial fibrillation: Currently on heparin.  Andretta Ergle restart Eliquis after ischemic evaluation Acute renal failure: Creatinine improving.  Likely due to VT storm Poorly controlled diabetes: Continue sliding scale insulin and home medications  Abanoub Hanken M. Tyianna Menefee MD 10/14/2022 12:02 PM

## 2022-10-14 NOTE — Interval H&P Note (Signed)
History and Physical Interval Note:  10/14/2022 5:09 PM  [name]  has presented today for surgery, with the diagnosis of vt.  The various methods of treatment have been discussed with the patient and family. After consideration of risks, benefits and other options for treatment, the patient has consented to  Procedure(s): LEFT HEART CATH AND CORONARY ANGIOGRAPHY (N/A) as a surgical intervention.  The patient's history has been reviewed, patient examined, no change in status, stable for surgery.  I have reviewed the patient's chart and labs.  Questions were answered to the patient's satisfaction.    Cath Lab Visit (complete for each Cath Lab visit)  Clinical Evaluation Leading to the Procedure:   ACS: No.  Non-ACS:    Anginal Classification: No Symptoms  Anti-ischemic medical therapy: No Therapy  Non-Invasive Test Results: No non-invasive testing performed  Prior CABG: No previous CABG        Verne Carrow

## 2022-10-14 NOTE — Inpatient Diabetes Management (Signed)
Inpatient Diabetes Program Recommendations  AACE/ADA: New Consensus Statement on Inpatient Glycemic Control   Target Ranges:  Prepandial:   less than 140 mg/dL      Peak postprandial:   less than 180 mg/dL (1-2 hours)      Critically ill patients:  140 - 180 mg/dL    Latest Reference Range & Units 10/13/22 06:02 10/13/22 11:28 10/13/22 11:49 10/13/22 16:34 10/13/22 21:23 10/14/22 06:20 10/14/22 11:42  Glucose-Capillary 70 - 99 mg/dL 536 (H) 644 (H) 034 (H) 149 (H) 248 (H) 132 (H) 107 (H)    Latest Reference Range & Units 03/13/22 11:14 10/02/22 15:54  Hemoglobin A1C 4.8 - 5.6 % 6.2 ! 9.2 (H)   Review of Glycemic Control  Diabetes history: DM2 Outpatient Diabetes medications: Levemir 12 units at bedtime, Novolog 0-15 units TID with meals, Trulicity 3 mg Qweek (Monday), Jardiance 10 mg QAM (not taken in over 1 month due to cost) Current orders for Inpatient glycemic control: Levemir 14 units at bedtime, Novolog 0-9 units TID with meals, Novolog 0-5 units QHS  Inpatient Diabetes Program Recommendations:    Insulin:  Once diet is resumed, may want to consider ordering Novolog 3 units TID with meals for meal coverage if patient eats at least 50% of meals.  HbgA1C:  A1C 9.2% on 10/02/22. Patient has been unable to get Jardiance for over 1 month due to cost since being in the doughnut hole (over $400). May need to adjust insulin regimen at time of discharge and encourage patient to follow up with Dr. Elvera Lennox (Endocrinologist).  NOTE: Noted consult for A1C and to review glycemic control.  Patient was recently inpatient 10/02/22-10/06/22 and inpatient diabetes coordinator spoke with patient on 10/06/22 during that admission (see note by D. Colette Ribas, RN on 10/06/22 for details).  Patient reported she had been without her Jardiance for 1 month due to cost (was in doughnut hole so med was over $400). She reported taking Levemir, Novolog, and Trulicity as prescribed and was down to one Trulicity pen and unsure  how much it would be when she went to refill the Trulicity; reported if it was too expensive she would not be able to get it refilled.  Patient sees Dr. Elvera Lennox (Endocrinologist) and was encouraged her to reach out to Dr. Elvera Lennox to let her know that she could not afford the Jardiance and see if she could get samples or if she needed to adjust the insulin dosages since she had plenty of insulin (had just gotten it refilled for $140 per insulin).    Thanks, Orlando Penner, RN, MSN, CDCES Diabetes Coordinator Inpatient Diabetes Program [phone] (Team Pager from 8am to 5pm)

## 2022-10-14 NOTE — Progress Notes (Signed)
Mobility Specialist Progress Note:   10/14/22 1000  Mobility  Activity Refused mobility  Mobility Specialist Start Time (ACUTE ONLY) 1038   Pt refused mobility d/t R sciatic pain. Stated she could barely make to to the BR this morning. Will f/u as able.   D'Vante Earlene Plater Mobility Specialist Please contact via Special educational needs teacher or Rehab office at [phone]

## 2022-10-15 ENCOUNTER — Encounter (HOSPITAL_COMMUNITY): Payer: Self-pay | Admitting: Cardiovascular Disease

## 2022-10-15 DIAGNOSIS — I4901 Ventricular fibrillation: Secondary | ICD-10-CM | POA: Diagnosis not present

## 2022-10-15 DIAGNOSIS — I4819 Other persistent atrial fibrillation: Secondary | ICD-10-CM | POA: Diagnosis not present

## 2022-10-15 DIAGNOSIS — I472 Ventricular tachycardia, unspecified: Secondary | ICD-10-CM | POA: Diagnosis not present

## 2022-10-15 DIAGNOSIS — I5022 Chronic systolic (congestive) heart failure: Secondary | ICD-10-CM | POA: Diagnosis not present

## 2022-10-15 LAB — BASIC METABOLIC PANEL
Anion gap: 11 (ref 5–15)
BUN: 28 mg/dL — ABNORMAL HIGH (ref 8–23)
CO2: 21 mmol/L — ABNORMAL LOW (ref 22–32)
Calcium: 8.1 mg/dL — ABNORMAL LOW (ref 8.9–10.3)
Chloride: 104 mmol/L (ref 98–111)
Creatinine, Ser: 1.85 mg/dL — ABNORMAL HIGH (ref 0.44–1.00)
GFR, Estimated: 28 mL/min — ABNORMAL LOW (ref >60)
Glucose, Bld: 161 mg/dL — ABNORMAL HIGH (ref 70–99)
Potassium: 4.9 mmol/L (ref 3.5–5.1)
Sodium: 136 mmol/L (ref 135–145)

## 2022-10-15 LAB — CBC
HCT: 31.6 % — ABNORMAL LOW (ref 36.0–46.0)
Hemoglobin: 10.2 g/dL — ABNORMAL LOW (ref 12.0–15.0)
MCH: 29.3 pg (ref 26.0–34.0)
MCHC: 32.3 g/dL (ref 30.0–36.0)
MCV: 90.8 fL (ref 80.0–100.0)
Platelets: 213 10*3/uL (ref 150–400)
RBC: 3.48 MIL/uL — ABNORMAL LOW (ref 3.87–5.11)
RDW: 16.9 % — ABNORMAL HIGH (ref 11.5–15.5)
WBC: 6.2 10*3/uL (ref 4.0–10.5)
nRBC: 0 % (ref 0.0–0.2)

## 2022-10-15 LAB — GLUCOSE, CAPILLARY
Glucose-Capillary: 163 mg/dL — ABNORMAL HIGH (ref 70–99)
Glucose-Capillary: 188 mg/dL — ABNORMAL HIGH (ref 70–99)
Glucose-Capillary: 87 mg/dL (ref 70–99)
Glucose-Capillary: 132 mg/dL — ABNORMAL HIGH (ref 70–99)

## 2022-10-15 MED ORDER — INSULIN ASPART 100 UNIT/ML IJ SOLN
3.0000 [IU] | Freq: Three times a day (TID) | INTRAMUSCULAR | Status: DC
  Administered 2022-10-15 – 2022-10-16 (×3): 3 [IU] via SUBCUTANEOUS

## 2022-10-15 MED ORDER — INSULIN ASPART 100 UNIT/ML IJ SOLN: 0.0000 [IU] | Freq: Every day | INTRAMUSCULAR | Status: DC

## 2022-10-15 MED ORDER — INSULIN ASPART 100 UNIT/ML IJ SOLN
0.0000 [IU] | Freq: Three times a day (TID) | INTRAMUSCULAR | Status: DC
  Administered 2022-10-15 – 2022-10-16 (×2): 2 [IU] via SUBCUTANEOUS

## 2022-10-15 MED ORDER — AMIODARONE HCL 200 MG PO TABS
400.0000 mg | ORAL_TABLET | Freq: Two times a day (BID) | ORAL | Status: DC
  Administered 2022-10-15 – 2022-10-16 (×3): 400 mg via ORAL
  Filled 2022-10-15 (×3): qty 2

## 2022-10-15 MED ORDER — APIXABAN 5 MG PO TABS
5.0000 mg | ORAL_TABLET | Freq: Two times a day (BID) | ORAL | Status: DC
  Administered 2022-10-15 – 2022-10-16 (×3): 5 mg via ORAL
  Filled 2022-10-15 (×3): qty 1

## 2022-10-15 NOTE — Plan of Care (Signed)
  Problem: Activity: Goal: Ability to return to baseline activity level will improve Outcome: Progressing   Problem: Cardiovascular: Goal: Vascular access site(s) Level 0-1 will be maintained Outcome: Progressing   Problem: Education: Goal: Ability to describe self-care measures that may prevent or decrease complications (Diabetes Survival Skills Education) will improve Outcome: Progressing   Problem: Coping: Goal: Ability to adjust to condition or change in health will improve Outcome: Progressing   Problem: Fluid Volume: Goal: Ability to maintain a balanced intake and output will improve Outcome: Progressing   Problem: Health Behavior/Discharge Planning: Goal: Ability to identify and utilize available resources and services will improve Outcome: Progressing Goal: Ability to manage health-related needs will improve Outcome: Progressing   Problem: Metabolic: Goal: Ability to maintain appropriate glucose levels will improve Outcome: Progressing   Problem: Nutritional: Goal: Maintenance of adequate nutrition will improve Outcome: Progressing   Problem: Skin Integrity: Goal: Risk for impaired skin integrity will decrease Outcome: Progressing   Problem: Education: Goal: Knowledge of General Education information will improve Description: Including pain rating scale, medication(s)/side effects and non-pharmacologic comfort measures Outcome: Progressing   Problem: Health Behavior/Discharge Planning: Goal: Ability to manage health-related needs will improve Outcome: Progressing   Problem: Clinical Measurements: Goal: Respiratory complications will improve Outcome: Progressing   Problem: Activity: Goal: Risk for activity intolerance will decrease Outcome: Progressing

## 2022-10-15 NOTE — Progress Notes (Signed)
Mobility Specialist Progress Note:   10/15/22 1500  Mobility  Activity Ambulated with assistance in hallway  Level of Assistance Contact guard assist, steadying assist  Distance Ambulated (ft) 200 ft  Activity Response Tolerated well  Mobility Referral Yes  $Mobility charge 1 Mobility  Mobility Specialist Start Time (ACUTE ONLY) 1531  Mobility Specialist Stop Time (ACUTE ONLY) 1541  Mobility Specialist Time Calculation (min) (ACUTE ONLY) 10 min    Pre Mobility: 65 HR During Mobility: 86 HR Post Mobility:  70 HR  Pt received in bed, agreeable to mobility. Mod I for bed mobility and STS. Asymptomatic throughout. Pt left in bed with call bell and all needs met.  D'Vante Earlene Plater Mobility Specialist Please contact via Special educational needs teacher or Rehab office at [phone]

## 2022-10-15 NOTE — Inpatient Diabetes Management (Signed)
Inpatient Diabetes Program Recommendations  AACE/ADA: New Consensus Statement on Inpatient Glycemic Control   Target Ranges:  Prepandial:   less than 140 mg/dL      Peak postprandial:   less than 180 mg/dL (1-2 hours)      Critically ill patients:  140 - 180 mg/dL    Latest Reference Range & Units 10/14/22 06:20 10/14/22 11:42 10/14/22 16:33 10/14/22 21:21 10/15/22 06:15  Glucose-Capillary 70 - 99 mg/dL 865 (H) 784 (H) 696 (H) 173 (H) 163 (H)   Review of Glycemic Control  Diabetes history: DM2 Outpatient Diabetes medications: Levemir 12 units at bedtime, Novolog 0-15 units TID with meals, Trulicity 3 mg Qweek (Monday), Jardiance 10 mg QAM (not taken in over 1 month due to cost)  Current orders for Inpatient glycemic control: Levemir 14 units at bedtime, Novolog 3 units TID with meals, Novolog 0-9 units TID with meals, Novolog 0-5 units QHS  Inpatient Diabetes Program Recommendations:    Outpatient DM medications: Since patient is not able to afford Jardiance, would recommend discharging patient on same insulin regimen that we are using as inpatient since glucose trends seems to be doing fairly well. Therefore, would recommend to discharge patient on Levemir 14 units at bedtime, Novolog 3 units TID with meals, plus correction Novolog 0-9 units TID with meals and Novolog 0-5 units at bedtime.   Thanks, Orlando Penner, RN, MSN, CDCES Diabetes Coordinator Inpatient Diabetes Program [phone] (Team Pager from 8am to 5pm)

## 2022-10-15 NOTE — Progress Notes (Addendum)
Patient Name: [name] Date of Encounter: 10/15/2022  Primary Cardiologist: [name], MD Electrophysiologist: Regan Lemming, MD  Interval Summary   Pt reports mild SOB with walking to the restroom.   At this time, the patient denies chest pain, shortness of breath, or any new concerns.  Inpatient Medications    Scheduled Meds:  aspirin EC  81 mg Oral Daily   busPIRone  5 mg Oral BID   carvedilol  12.5 mg Oral BID WC   Chlorhexidine Gluconate Cloth  6 each Topical Daily   cholecalciferol  2,000 Units Oral q morning   dicyclomine  10 mg Oral TID AC & HS   gabapentin  200 mg Oral BID   insulin aspart  0-5 Units Subcutaneous QHS   insulin aspart  0-9 Units Subcutaneous TID WC   insulin detemir  14 Units Subcutaneous QHS   magnesium oxide  400 mg Oral BID   pantoprazole  40 mg Oral q morning   rosuvastatin  40 mg Oral Daily   sertraline  50 mg Oral Daily   sodium chloride flush  3 mL Intravenous Q12H   Continuous Infusions:  sodium chloride 10 mL/hr at 10/13/22 1200   sodium chloride     amiodarone 30 mg/hr (10/15/22 0701)   PRN Meds: sodium chloride, acetaminophen, fluticasone, nitroGLYCERIN, mouth rinse, sodium chloride flush   Vital Signs    Vitals:   10/14/22 2325 10/15/22 0322 10/15/22 0723 10/15/22 0735  BP: (!) 128/51 109/63 (!) 130/58 131/61  Pulse: 60 60 60 60  Resp: 20 19  17   Temp: 98.7 F (37.1 C) 98.2 F (36.8 C)  99.4 F (37.4 C)  TempSrc: Oral Oral  Oral  SpO2: 94% 91%    Weight:      Height:        Intake/Output Summary (Last 24 hours) at 10/15/2022 0954 Last data filed at 10/15/2022 0701 Gross per 24 hour  Intake 1174.57 ml  Output --  Net 1174.57 ml   Filed Weights   10/12/22 1100 10/12/22 1224 10/13/22 0600  Weight: 87 kg 85.2 kg 86.3 kg    Physical Exam    GEN- The patient is well appearing, alert and oriented x 3 today.   Lungs- Clear to ausculation bilaterally, normal work of breathing Cardiac-  regular,  occasional VP  rate and rhythm, no murmurs, rubs or gallops GI- soft, NT, ND, + BS Extremities- no clubbing or cyanosis. No edema  Telemetry    SR, occasional VP (personally reviewed)  Hospital Course    [name] is a 77 y.o. female admitted 7/22 for VT/VF storm. 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 Most recently VT/VF on 7/21 s/p ICD shock x2 - she was unaware of events but called son disoriented after episodes. Ranexa was not tolerated on recent trial due to diarrhea (improved after stopping). IV amiodarone initiated on admit. No further episodes of sustained VT/shock. AKI resolving. Pending LHC to rule out ischemia.   Assessment & Plan    VT/VF Storm Longstanding hx of VT. VF is new for her, r/o ischemia.  -non-obs disease on LHC 7/24, no targets for intervention  -transition to amiodarone 400mg  BID oral, stop IV infusion 1 hour post PO dose  -tele monitoring, tx to cardiac tele  Chronic Systolic CHF, LVEF 25-30% CAD  -continue coreg, ASA 81mg  every day   Atrial Fibrillation  -resume home eliquis  -continue beta blocker    Secondary Hypercoagulable State  -eliquis as above  AKI  -Trend BMP / urinary output -Replace electrolytes as indicated -Avoid nephrotoxic agents, ensure adequate renal perfusion   HLD  -crestor    Poorly Controlled DM  Hgb A1c >9 -reviewed with DM Coordinator, pt ran out of Jardiance due to cost. Followed by Dr. Elvera Lennox.  [name] need close follow up and alternate medication regimen for control.  -continue carb modified diet  -levemir 14 units every day  -SSI, sensitive scale ACHS -continue 3 units meal coverage if > 50% of meal consumed   For questions or updates, please contact [facility] Please consult [email or web address] for contact info under Cardiology/STEMI.  Signed, Canary Brim, MSN, APRN, NP-C, AGACNP-BC [facility] - Electrophysiology  10/15/2022, 10:11 AM  I have seen and examined this patient with Canary Brim.  Agree with above, note added to reflect my findings.  No further VT or VF.  Left heart catheterization yesterday without targets for intervention.  Today but no new concerns  GEN: Well nourished, well developed, in no acute distress  HEENT: normal  Neck: no JVD, carotid bruits, or masses Cardiac: iRRR; no murmurs, rubs, or gallops,no edema  Respiratory:  clear to auscultation bilaterally, normal work of breathing GI: soft, nontender, nondistended, + BS MS: no deformity or atrophy  Skin: warm and dry, device site well healed Neuro:  Strength and sensation are intact Psych: euthymic mood, full affect   VT/VF storm: Has a longstanding history of VT.  Nonobstructive disease on left heart catheterization.  [name] transition to p.o. amiodarone as above. Chronic systolic heart failure: Ejection fraction 25 to 30%.  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  Creatinine improving now that she is not having her arrhythmia.  [name] M. Glennis Borger MD 10/15/2022 2:57 PM

## 2022-10-15 NOTE — Evaluation (Signed)
Physical Therapy Evaluation Patient Details Name: [name] MRN: [id number] DOB: 02-03-1946 Today's Date: 10/15/2022  History of Present Illness  Pt is 77 year old presented to [facility] on  10/12/22 for VT storm. PMH - recurrent VT, CHF, afib, DM, peripheral neuropathy, HTN, ICD  Clinical Impression  Pt doing well with mobility and at or very close to baseline with mobility. Will sign off for PT and refer to mobility team for continued ambulation during remainder of stay.          Assistance Recommended at Discharge PRN  If plan is discharge home, recommend the following:  Can travel by private vehicle  Help with stairs or ramp for entrance        Equipment Recommendations None recommended by PT  Recommendations for Other Services       Functional Status Assessment Patient has not had a recent decline in their functional status     Precautions / Restrictions Precautions Precautions: Fall      Mobility  Bed Mobility Overal bed mobility: Modified Independent                  Transfers Overall transfer level: Needs assistance Equipment used: Rollator (4 wheels) Transfers: Sit to/from Stand Sit to Stand: Supervision           General transfer comment: supervision for safety and lines    Ambulation/Gait Ambulation/Gait assistance: Supervision Gait Distance (Feet): 200 Feet Assistive device: Rollator (4 wheels) Gait Pattern/deviations: Step-through pattern, Decreased stride length, Trunk flexed Gait velocity: adequate Gait velocity interpretation: 1.31 - 2.62 ft/sec, indicative of limited community ambulator   General Gait Details: supervision for safety and lines. As pt fatigued she propped forearms on rollator  Stairs            Wheelchair Mobility     Tilt Bed    Modified Rankin (Stroke Patients Only)       Balance Overall balance assessment: Needs assistance Sitting-balance support: No upper extremity supported, Feet  supported Sitting balance-Leahy Scale: Good     Standing balance support: Bilateral upper extremity supported, Single extremity supported, During functional activity Standing balance-Leahy Scale: Poor Standing balance comment: relies on UE support for balance                             Pertinent Vitals/Pain Pain Assessment Pain Assessment: Faces Faces Pain Scale: Hurts a little bit Pain Location: rt sciatic Pain Descriptors / Indicators: Grimacing Pain Intervention(s): Limited activity within patient's tolerance    Home Living Family/patient expects to be discharged to:: Private residence Living Arrangements: Children Available Help at Discharge: Family;Available 24 hours/day (son and granddaughter) Type of Home: House Home Access: Stairs to enter Entrance Stairs-Rails: Right;Left;Can reach both Entrance Stairs-Number of Steps: 4 Alternate Level Stairs-Number of Steps: flight Home Layout: Two level;Bed/bath upstairs Home Equipment: Rollator (4 wheels)      Prior Function Prior Level of Function : History of Falls (last six months);Independent/Modified Independent             Mobility Comments: Uses rollator, sciatic nerve issues and knee pain ADLs Comments: Pt I with ADLs     Hand Dominance   Dominant Hand: Right    Extremity/Trunk Assessment   Upper Extremity Assessment Upper Extremity Assessment: Overall WFL for tasks assessed    Lower Extremity Assessment Lower Extremity Assessment: Overall WFL for tasks assessed (limited by rt sciatic pain)       Communication  Communication: No difficulties  Cognition Arousal/Alertness: Awake/alert Behavior During Therapy: WFL for tasks assessed/performed Overall Cognitive Status: Within Functional Limits for tasks assessed                                          General Comments General comments (skin integrity, edema, etc.): VSS    Exercises     Assessment/Plan    PT  Assessment Patient does not need any further PT services  PT Problem List         PT Treatment Interventions      PT Goals (Current goals can be found in the Care Plan section)  Acute Rehab PT Goals PT Goal Formulation: All assessment and education complete, DC therapy    Frequency       Co-evaluation               AM-PAC PT "6 Clicks" Mobility  Outcome Measure Help needed turning from your back to your side while in a flat bed without using bedrails?: None Help needed moving from lying on your back to sitting on the side of a flat bed without using bedrails?: None Help needed moving to and from a bed to a chair (including a wheelchair)?: A Little Help needed standing up from a chair using your arms (e.g., wheelchair or bedside chair)?: A Little Help needed to walk in hospital room?: A Little Help needed climbing 3-5 steps with a railing? : A Little 6 Click Score: 20    End of Session   Activity Tolerance: Patient tolerated treatment well Patient left: in bed;with call bell/phone within reach;with bed alarm set Nurse Communication: Mobility status PT Visit Diagnosis: Other abnormalities of gait and mobility (R26.89)    Time: 4098-1191 PT Time Calculation (min) (ACUTE ONLY): 18 min   Charges:   PT Evaluation $PT Eval Low Complexity: 1 Low   PT General Charges $$ ACUTE PT VISIT: 1 Visit         [facility] PT Acute Rehabilitation Services Office [phone]   Angelina Ok [facility] 10/15/2022, 1:30 PM

## 2022-10-16 ENCOUNTER — Other Ambulatory Visit: Payer: Self-pay | Admitting: Pulmonary Disease

## 2022-10-16 DIAGNOSIS — I4819 Other persistent atrial fibrillation: Secondary | ICD-10-CM | POA: Diagnosis not present

## 2022-10-16 DIAGNOSIS — I472 Ventricular tachycardia, unspecified: Secondary | ICD-10-CM | POA: Diagnosis not present

## 2022-10-16 DIAGNOSIS — I4901 Ventricular fibrillation: Secondary | ICD-10-CM | POA: Diagnosis not present

## 2022-10-16 DIAGNOSIS — I5022 Chronic systolic (congestive) heart failure: Secondary | ICD-10-CM | POA: Diagnosis not present

## 2022-10-16 LAB — BASIC METABOLIC PANEL
Anion gap: 11 (ref 5–15)
BUN: 23 mg/dL (ref 8–23)
CO2: 21 mmol/L — ABNORMAL LOW (ref 22–32)
Calcium: 8.5 mg/dL — ABNORMAL LOW (ref 8.9–10.3)
Chloride: 106 mmol/L (ref 98–111)
Creatinine, Ser: 1.71 mg/dL — ABNORMAL HIGH (ref 0.44–1.00)
GFR, Estimated: 31 mL/min — ABNORMAL LOW (ref >60)
Glucose, Bld: 165 mg/dL — ABNORMAL HIGH (ref 70–99)
Potassium: 5 mmol/L (ref 3.5–5.1)
Sodium: 138 mmol/L (ref 135–145)

## 2022-10-16 LAB — GLUCOSE, CAPILLARY
Glucose-Capillary: 79 mg/dL (ref 70–99)
Glucose-Capillary: 192 mg/dL — ABNORMAL HIGH (ref 70–99)

## 2022-10-16 MED ORDER — AMIODARONE HCL 200 MG PO TABS: 200.0000 mg | ORAL_TABLET | Freq: Every day | ORAL | 6 refills | Status: DC

## 2022-10-16 MED ORDER — CARVEDILOL 25 MG PO TABS: 25.0000 mg | ORAL_TABLET | Freq: Two times a day (BID) | ORAL | 3 refills | Status: DC

## 2022-10-16 MED ORDER — ASPIRIN 81 MG PO TBEC: 81.0000 mg | DELAYED_RELEASE_TABLET | Freq: Every day | ORAL | 12 refills | Status: DC

## 2022-10-16 MED ORDER — AMIODARONE HCL 400 MG PO TABS: ORAL_TABLET | ORAL | 0 refills | Status: DC

## 2022-10-16 MED ORDER — INSULIN DETEMIR 100 UNIT/ML ~~LOC~~ SOLN: 14.0000 [IU] | Freq: Every day | SUBCUTANEOUS | Status: DC

## 2022-10-16 NOTE — Discharge Summary (Addendum)
Physician Discharge Summary  Patient ID: [name] MRN: [id number] DOB/AGE: 06-03-1945 77 y.o.  Admit date: 10/12/2022 Discharge date: 10/16/2022    Discharge Diagnoses:  VT/VT Storm  Chronic Systolic CHF, LVEF 35-30% CAD  Paroxysmal Atrial Fibrillation  Secondary Hypercoagulable State  AKI  HLD Poorly Controlled DM                                                               DISCHARGE SUMMARY    77 y.o. female admitted 7/22 for VT/VF storm. 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 Most recently VT/VF on 7/21 s/p ICD shock x2 - she was unaware of events but called son disoriented after episodes. Ranexa was not tolerated on recent trial due to diarrhea (improved after stopping). IV amiodarone initiated on admit. No further episodes of sustained VT/shock. AKI resolving. LHC 10/14/22 was negative for PCI targets, recommendations for medical management. She was transitioned to oral amiodarone, rhythm remained stable. The patient was found on admission to have elevated glucose. She had been off her Jardiance due to cost (donut hole).  The diabetes coordinator evaluated the patient with short term regimen recommendations with close follow up. She was seen by [name] on am of 7/26 and medically cleared for discharge with EP follow up.      DISCHARGE PLAN BY DIAGNOSIS       VT/VF Storm Longstanding hx of VT. VF is new for her, r/o ischemia. Non-obs disease on [facility] 7/24, no targets for intervention   Discharge Plan: -continue amiodarone 400mg  BID for 2 weeks, then 400mg  daily for 2 weeks, then 200mg  every day  -outpatient follow up with EP post discharge -no driving for 6 months per [facility], discussed with patient (does not drive)   Chronic Systolic CHF, LVEF 25-30% CAD   Discharge Plan: -continue coreg, ASA  Atrial Fibrillation   Discharge Plan: -continue beta blocker, eliquis    Secondary Hypercoagulable State   Discharge Plan: -eliquis as above    AKI   Discharge Plan: -resolving, [name] need repeat BMP at 8/14 follow up with EP -inpatient creatinine trend below  Recent Labs  Lab 10/12/22 1156 10/13/22 0558 10/14/22 0329 10/15/22 0658 10/16/22 0841  CREATININE 2.74* 2.24* 1.88* 1.85* 1.71*   HLD   Discharge Plan: -crestor    Poorly Controlled DM  Hgb A1c >9  Discharge Plan: -patient has follow up with [name] on 8/29 -increase levemir to 14 units daily at discharge until appt above. Query medication assistance vs change in regimen due to cost, defer to [name]     SIGNIFICANT DIAGNOSTIC STUDIES LHC 10/14/22 > non-obstructive disease, no targets for intervention    Discharge Exam: General: pleasant elderly adult female lying in bed in NAD Neuro:AAOx4, speech clear, MAE CV: S1S2 irr irr (intermittent VP, AF), no m/r/g PULM: non-labored at rest, clear Extremities: warm/dry, no edema   Vitals:   10/15/22 1939 10/15/22 2302 10/16/22 0230 10/16/22 0838  BP: 133/69 (!) 148/71 (!) 113/59 (!) 146/64  Pulse:    61  Resp: 20 19 20 14   Temp: 98.2 F (36.8 C) 98.5 F (36.9 C) 97.8 F (36.6 C)   TempSrc: Oral Oral Oral   SpO2: 96% 99% 99% 99%  Weight:      Height:  Discharge Labs  BMET Recent Labs  Lab 10/12/22 1156 10/13/22 0558 10/14/22 0329 10/15/22 0658 10/16/22 0841  NA 140 137 137 136 138  K 4.1 3.7 4.4 4.9 5.0  CL 104 104 104 104 106  CO2 23 22 23  21* 21*  GLUCOSE 174* 154* 154* 161* 165*  BUN 48* 44* 34* 28* 23  CREATININE 2.74* 2.24* 1.88* 1.85* 1.71*  CALCIUM 8.5* 7.9* 8.2* 8.1* 8.5*  MG 2.5*  --  2.1  --   --     CBC Recent Labs  Lab 10/13/22 0558 10/14/22 0329 10/15/22 0741  HGB 10.7* 10.1* 10.2*  HCT 33.2* 31.4* 31.6*  WBC 5.5 5.8 6.2  PLT 180 209 213     Discharge Instructions     Diet - low sodium heart healthy   Complete by: As directed    Diet Carb Modified   Complete by: As directed    Discharge instructions   Complete by: As directed    1.  Review medications carefully as they have changed.   Increase activity slowly   Complete by: As directed            Allergies as of 10/16/2022       Reactions   Veltassa [patiromer] Diarrhea   Darvon Other (See Comments)   indigestion   Mexiletine Hcl    N/V, tremors, difficulty walking, blurred vision   Promethazine Hcl Other (See Comments)   hyperactivity   Ranexa [ranolazine Er] Diarrhea   Spironolactone Diarrhea, Nausea And Vomiting   Plavix [clopidogrel Bisulfate] Rash        Medication List     TAKE these medications    Accu-Chek Nano SmartView w/Device Kit Use to test blood sugar 4 times daily as instructed. Dx code: E11.59   acetaminophen 325 MG tablet Commonly known as: TYLENOL Take 650 mg by mouth every 6 (six) hours as needed for moderate pain.   amiodarone 400 MG tablet Commonly known as: PACERONE Take one 400 mg tablet once in am and once in evening for 2 weeks (through 10/30/22), then decrease to one 400 mg daily for 2 weeks (through 11/14/22), then start 200mg  daily (separate prescription) What changed:  medication strength how much to take how to take this when to take this additional instructions   amiodarone 200 MG tablet Commonly known as: Pacerone Take 1 tablet (200 mg total) by mouth daily. Start taking on: November 15, 2022 What changed: You were already taking a medication with the same name, and this prescription was added. Make sure you understand how and when to take each.   aspirin EC 81 MG tablet Take 1 tablet (81 mg total) by mouth daily. Swallow whole. Start taking on: October 17, 2022   busPIRone 5 MG tablet Commonly known as: BUSPAR Take 1 tablet (5 mg total) by mouth 2 (two) times daily.   carvedilol 25 MG tablet Commonly known as: COREG Take 1 tablet (25 mg total) by mouth 2 (two) times daily with a meal. What changed:  medication strength how much to take   dicyclomine 10 MG capsule Commonly known as: BENTYL TAKE 1  CAPSULE (10 MG TOTAL) BY MOUTH 4 (FOUR) TIMES DAILY BEFORE MEALS AND AT BEDTIME. What changed: See the new instructions.   Eliquis 5 MG Tabs tablet Generic drug: apixaban TAKE 1 TABLET BY MOUTH TWICE A DAY What changed: how much to take   empagliflozin 10 MG Tabs tablet Commonly known as: JARDIANCE Take 1 tablet (10 mg total) by  mouth every morning.   EYE VITAMINS PO Take 1 tablet by mouth 2 (two) times daily.   fluticasone 50 MCG/ACT nasal spray Commonly known as: FLONASE Place 1 spray into both nostrils daily as needed for allergies or rhinitis.   furosemide 20 MG tablet Commonly known as: LASIX TAKE 1 TABLET BY MOUTH EVERY DAY What changed: when to take this   gabapentin 100 MG capsule Commonly known as: NEURONTIN Take 2 capsules (200 mg total) by mouth 2 (two) times daily.   insulin detemir 100 UNIT/ML injection Commonly known as: LEVEMIR Inject 0.14 mLs (14 Units total) into the skin at bedtime. What changed: how much to take   levETIRAcetam 250 MG tablet Commonly known as: KEPPRA TAKE 1 TABLET BY MOUTH TWICE A DAY   loperamide 2 MG capsule Commonly known as: IMODIUM Take 1 capsule (2 mg total) by mouth as needed for diarrhea or loose stools. What changed: when to take this   losartan 25 MG tablet Commonly known as: Cozaar Take 1 tablet (25 mg total) by mouth daily. What changed: when to take this   magnesium oxide 400 (240 Mg) MG tablet Commonly known as: MAG-OX TAKE 1 TABLET BY MOUTH TWICE A DAY   nitroGLYCERIN 0.4 MG SL tablet Commonly known as: NITROSTAT Place 1 tablet (0.4 mg total) under the tongue every 5 (five) minutes as needed for chest pain.   NovoLOG FlexPen 100 UNIT/ML FlexPen Generic drug: insulin aspart Inject up to 15 units daily under skin as advised What changed:  how much to take how to take this when to take this additional instructions   omeprazole 20 MG tablet Commonly known as: PRILOSEC OTC Take 20 mg by mouth every  morning.   rosuvastatin 40 MG tablet Commonly known as: CRESTOR Take 1 tablet (40 mg total) by mouth daily. NEEDS FOLLOW UP APPOINTMENT FOR MORE REFILLS What changed:  when to take this additional instructions   sertraline 50 MG tablet Commonly known as: ZOLOFT Take 50 mg by mouth at bedtime.   Trulicity 3 MG/0.5ML Sopn Generic drug: Dulaglutide Inject 3 mg into the skin once a week. What changed: additional instructions   Vitamin D3 50 MCG (2000 UT) Tabs Take 2,000 Units by mouth every morning.          Disposition: Home. No new home health needs identified at discharge.   Discharged Condition: [name] has met maximum benefit of inpatient care and is medically stable and cleared for discharge.  Patient is pending follow up as above.      Time spent on disposition:  Greater than 30 Minutes.     Signed: Canary Brim, MSN, APRN, NP-C, AGACNP-BC [facility] 10/16/2022, 11:26 AM   Please see [email or web address] for pager details.       I have seen and examined this patient with Canary Brim.  Agree with above, note added to reflect my findings.  Patient was admitted to the hospital post VT/VF storm.  She was reloaded on IV amiodarone.  Left heart catheterization showed stable coronary artery disease without any intervention.  She was switched to p.o. amiodarone without further arrhythmia.  Plan for discharge today with follow-up in clinic.  GEN: Well nourished, well developed, in no acute distress  HEENT: normal  Neck: no JVD, carotid bruits, or masses Cardiac: RRR; no murmurs, rubs, or gallops,no edema  Respiratory:  clear to auscultation bilaterally, normal work of breathing GI: soft, nontender, nondistended, + BS MS: no deformity or atrophy  Skin: warm and dry, device site well healed Neuro:  Strength and sensation are intact Psych: euthymic mood, full affect     Kiaja Shorty M. Davanta Meuser MD 10/16/2022 1:29 PM

## 2022-10-16 NOTE — Progress Notes (Signed)
Discharge instructions reviewed with pt and her son. Pt and son verbalized of instructions. Pt to follow up with her doctors as appointments are scheduled. Encouraged pt to discuss her need for assistance with affording her diabetic medications or changing to meds she can afford with her endocrinologist MD she see's in a few weeks.  Copy of instructions given to pt. Pt informed her scripts were sent to her pharmacy for pick up.  Pt to be d/c'd via wheelchair with belongings, with her son.           To be escorted by hospital volunteer.   Maxamillion Banas,RN SWOT

## 2022-10-16 NOTE — Care Management Important Message (Signed)
Important Message  Patient Details  Name: [name] MRN: [id number] Date of Birth: 1946/01/01   Medicare Important Message Given:  Yes     [name] Stefan Church 10/16/2022, 1:09 PM

## 2022-10-16 NOTE — Progress Notes (Addendum)
Mobility Specialist Progress Note:   10/16/22 1158  Mobility  Activity Ambulated with assistance in hallway  Level of Assistance Contact guard assist, steadying assist  Assistive Device Four wheel walker  Distance Ambulated (ft) 200 ft  Activity Response Tolerated well  Mobility Referral Yes  $Mobility charge 1 Mobility  Mobility Specialist Start Time (ACUTE ONLY) 1115  Mobility Specialist Stop Time (ACUTE ONLY) 1125  Mobility Specialist Time Calculation (min) (ACUTE ONLY) 10 min    Pre Mobility: 67 HR , 117/70 BP  During Mobility: 77 HR   Post Mobility: 60 HR   Pt received in bed, agreeable to mobility. C/o knee pain during ambulation, otherwise asymptomatic throughout. Pt returned to bed with call bell in hand and all needs met.   Leory Plowman  Mobility Specialist Please contact via [employer] office at [phone]

## 2022-10-25 ENCOUNTER — Other Ambulatory Visit: Payer: Self-pay | Admitting: Internal Medicine

## 2022-10-27 IMAGING — CT CT HEAD W/O CM
3 series · 15 of 47 positions shown, 18 images · non-contrast
Comparison: 07/27/2012

CLINICAL DATA: Neuro deficit, acute, stroke suspected



[Series 4: head 5.0 h30s · axial · 0.42mm/px · z∈[-98,+27]mm · 9 of 31 slices shown, 12 images]
[im 3/31  brain]
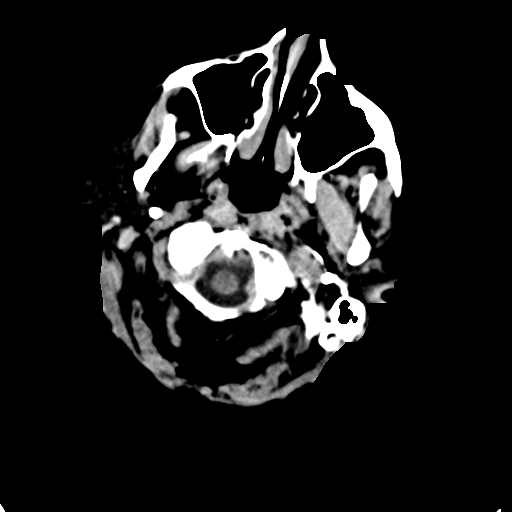
[im 3/31  bone]
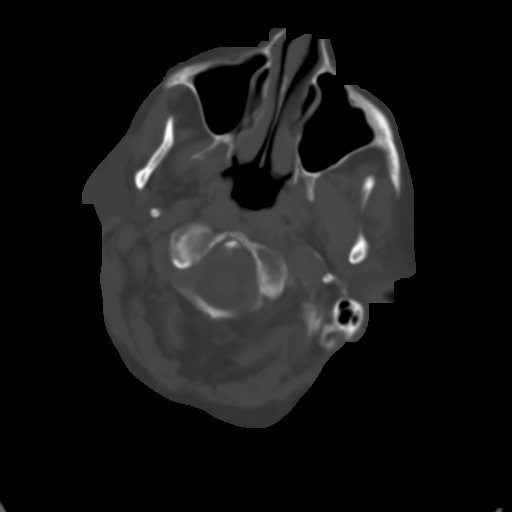
[im 6/31  brain]
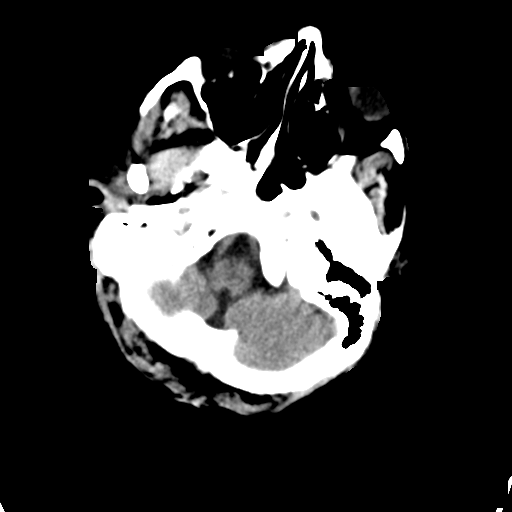
[im 9/31  brain]
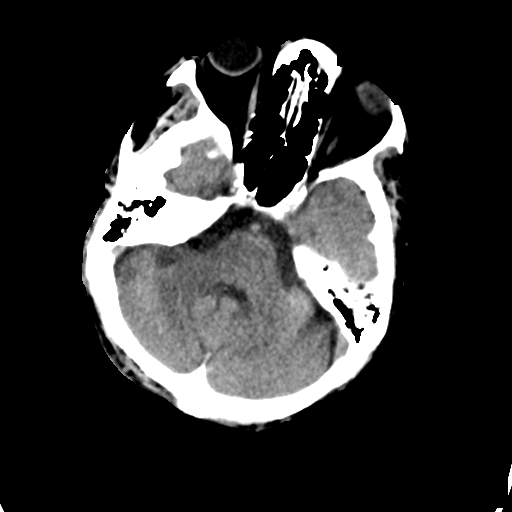
[im 12/31  brain]
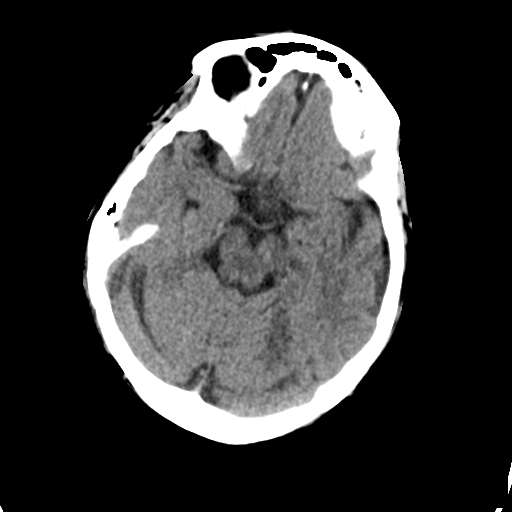
[im 16/31  brain]
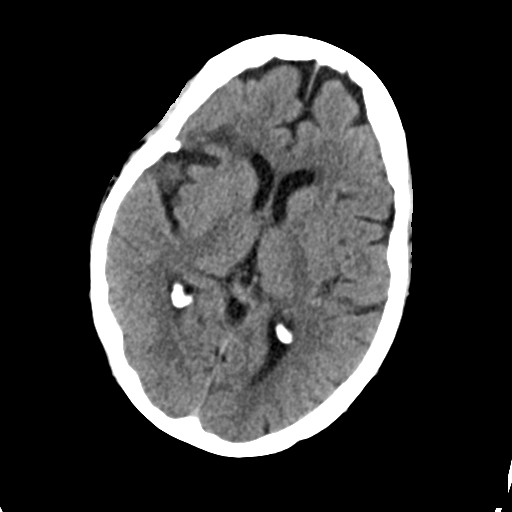
[im 16/31  bone]
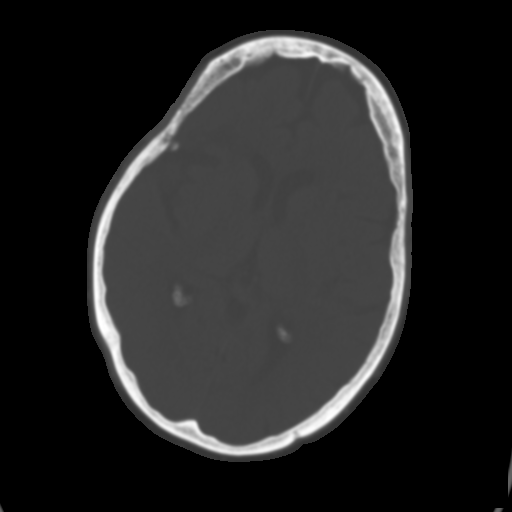
[im 19/31  brain]
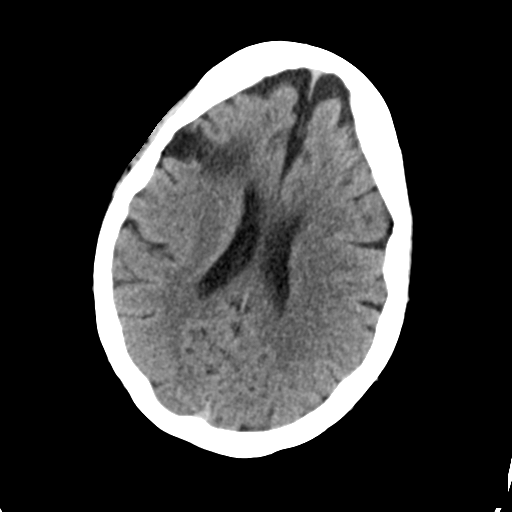
[im 22/31  brain]
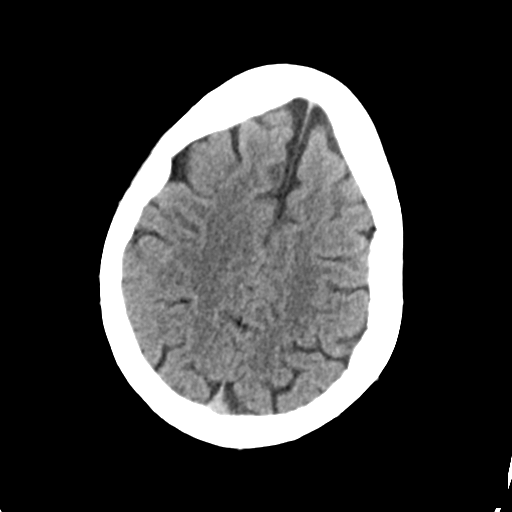
[im 25/31  brain]
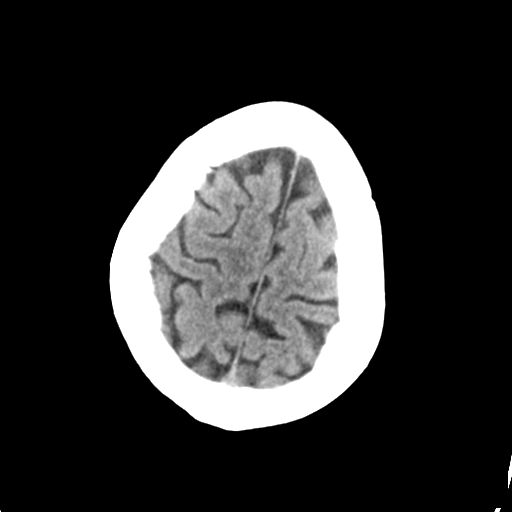
[im 28/31  brain]
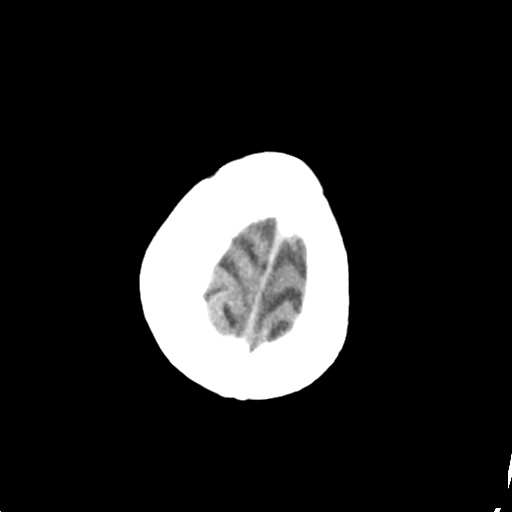
[im 28/31  bone]
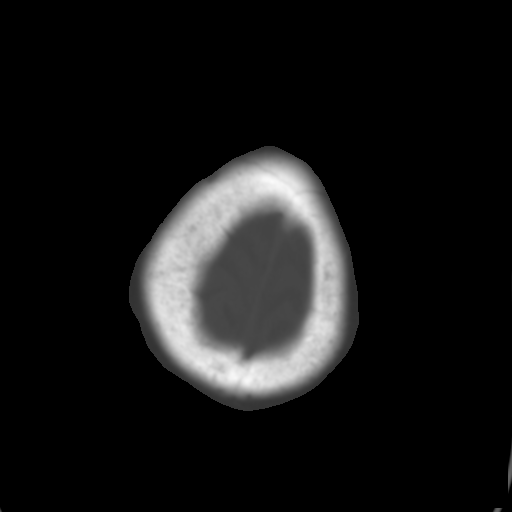

[Series 5: head 3.0 mpr cor · coronal · 0.32mm/px · 3 of 67 slices shown]
[im 23/67  brain]
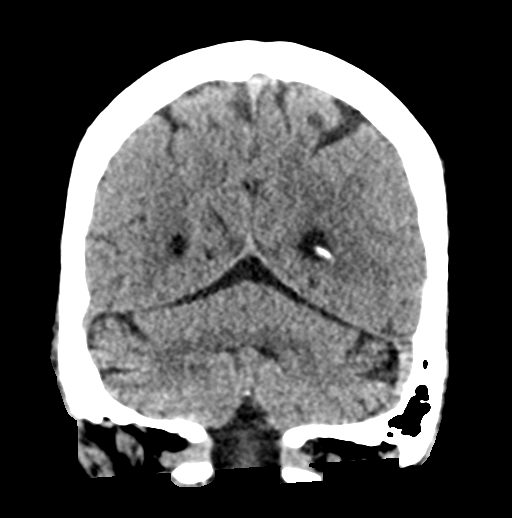
[im 30/67  brain]
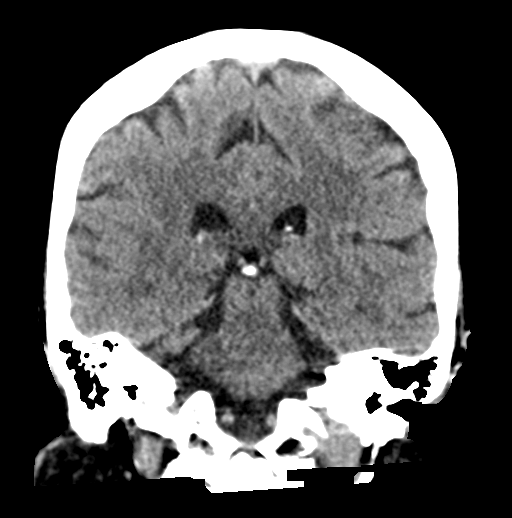
[im 37/67  brain]
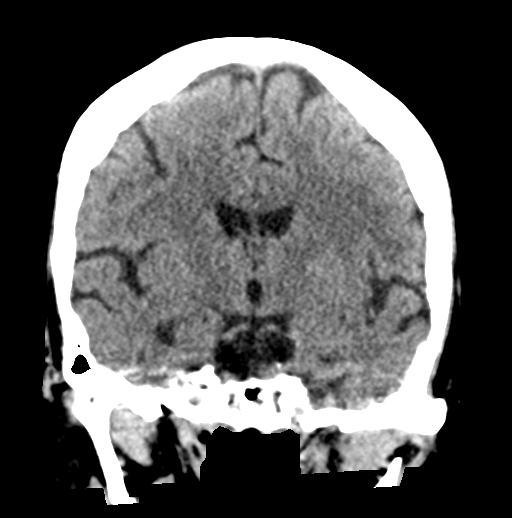

[Series 6: head 3.0 mpr sag · sagittal · 0.31mm/px · 3 of 53 slices shown]
[im 18/53  brain]
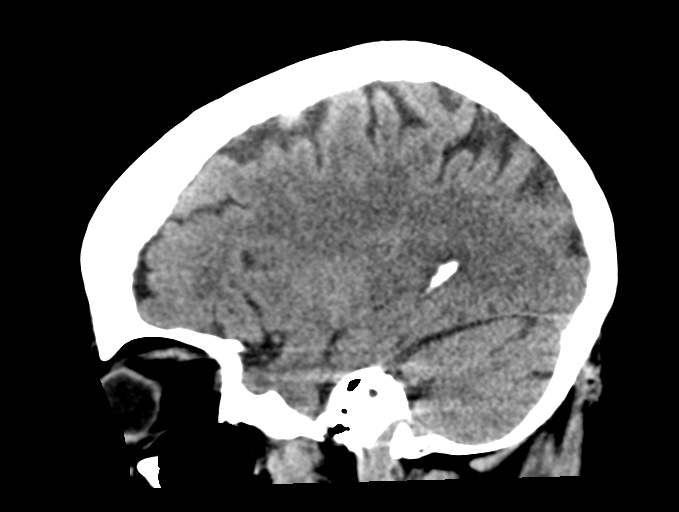
[im 27/53  brain]
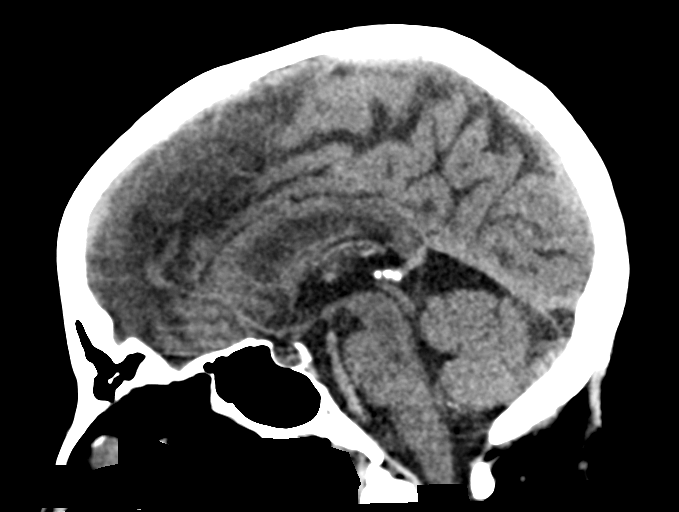
[im 35/53  brain]
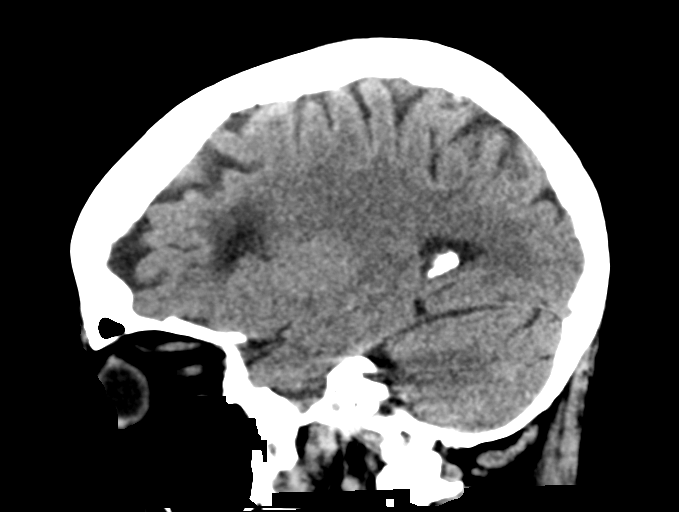

[15 of 47 positions shown; findings below may reference images not displayed]

FINDINGS: Brain: Old right frontal infarct, stable. No acute intracranial
abnormality. Specifically, no hemorrhage, hydrocephalus, mass
lesion, acute infarction, or significant intracranial injury.

Vascular: No hyperdense vessel or unexpected calcification.

Skull: No acute calvarial abnormality.

Sinuses/Orbits: No acute findings

Other: None
IMPRESSION: Old right frontal infarct, stable.

No acute intracranial abnormality.

## 2022-10-27 NOTE — Progress Notes (Signed)
Remote ICD transmission.   

## 2022-10-28 IMAGING — DX DG KNEE 1-2V PORT*R*
1 series · 4 of 4 positions shown · non-contrast
Comparison: None Available.

CLINICAL DATA: Acute RIGHT knee pain

EXAM:
PORTABLE RIGHT KNEE - 1-2 VIEW

[Series 1: knee · 0.14mm/px · 4 of 4 slices shown]
[im 1/4]
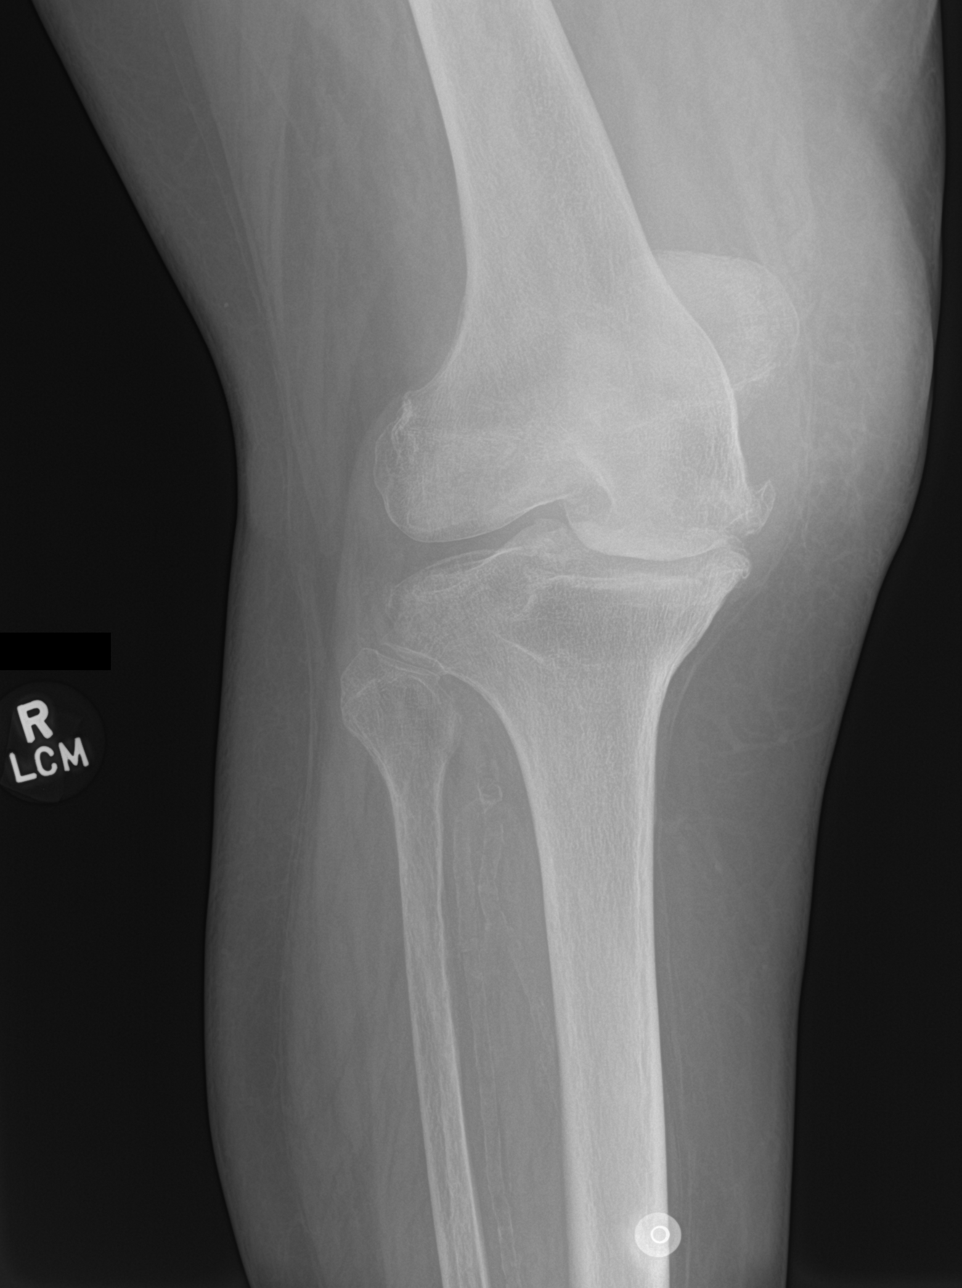
[im 2/4]
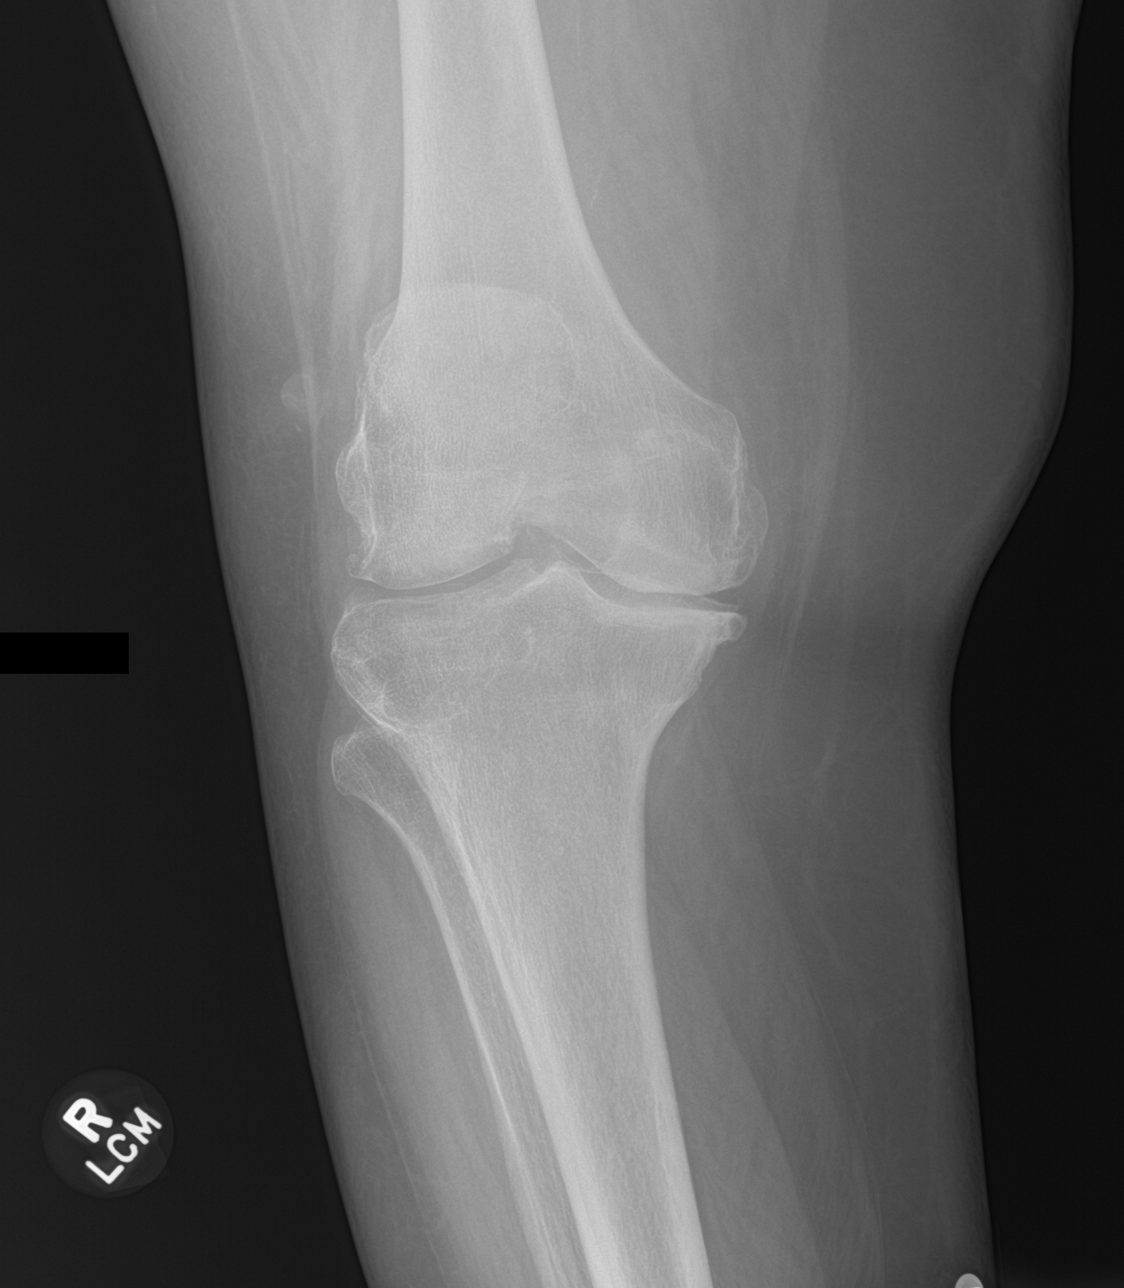
[im 3/4]
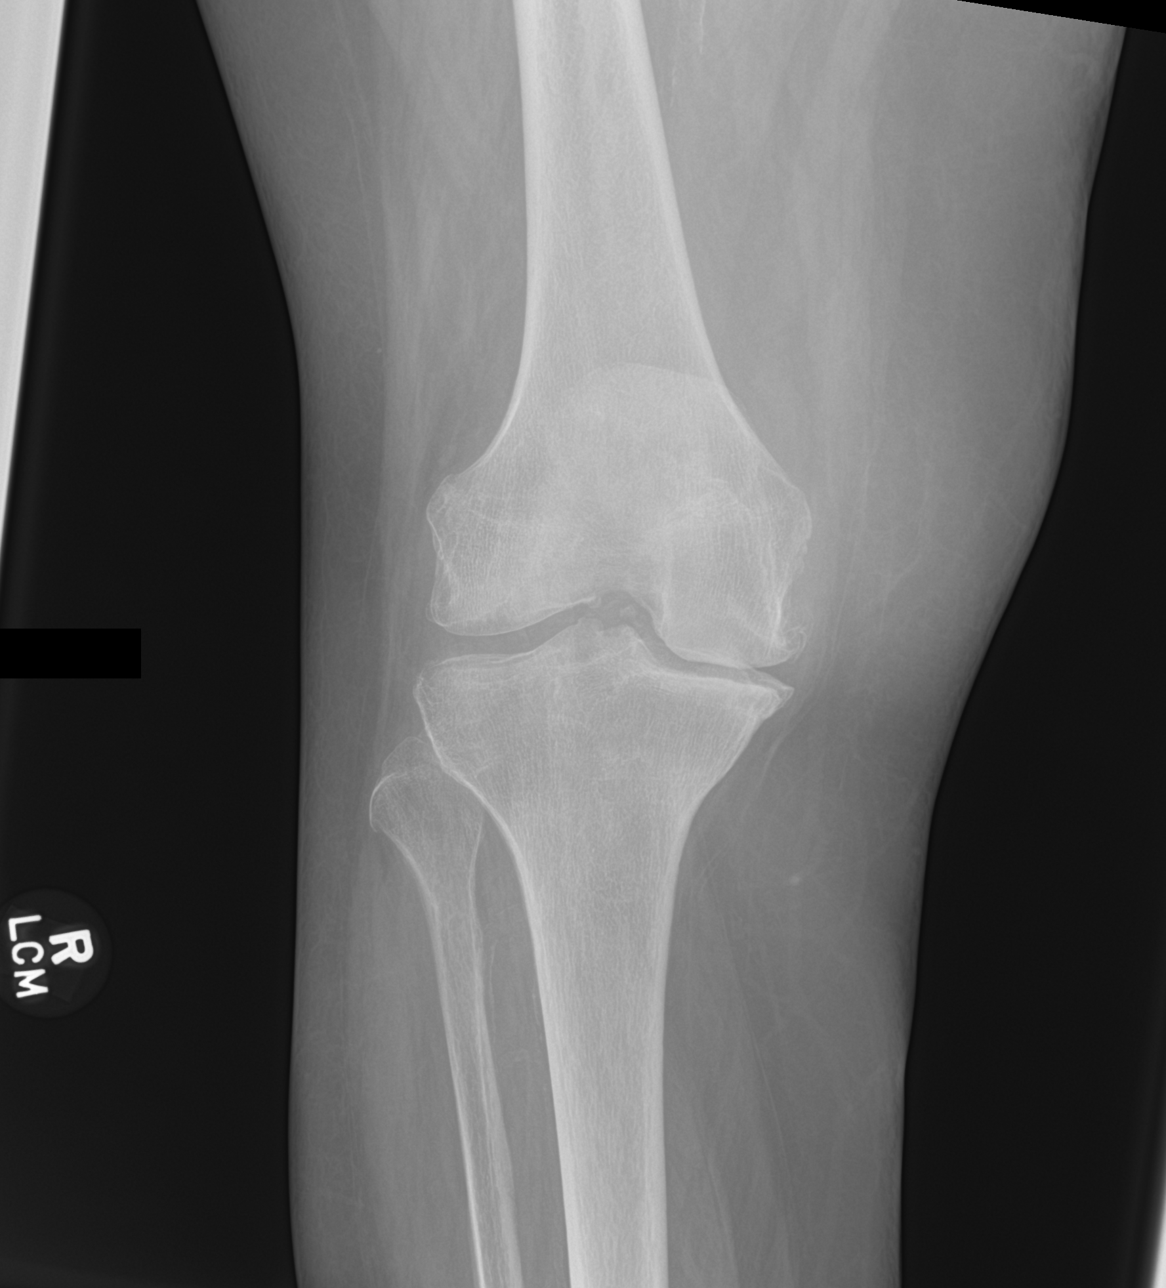
[im 4/4]
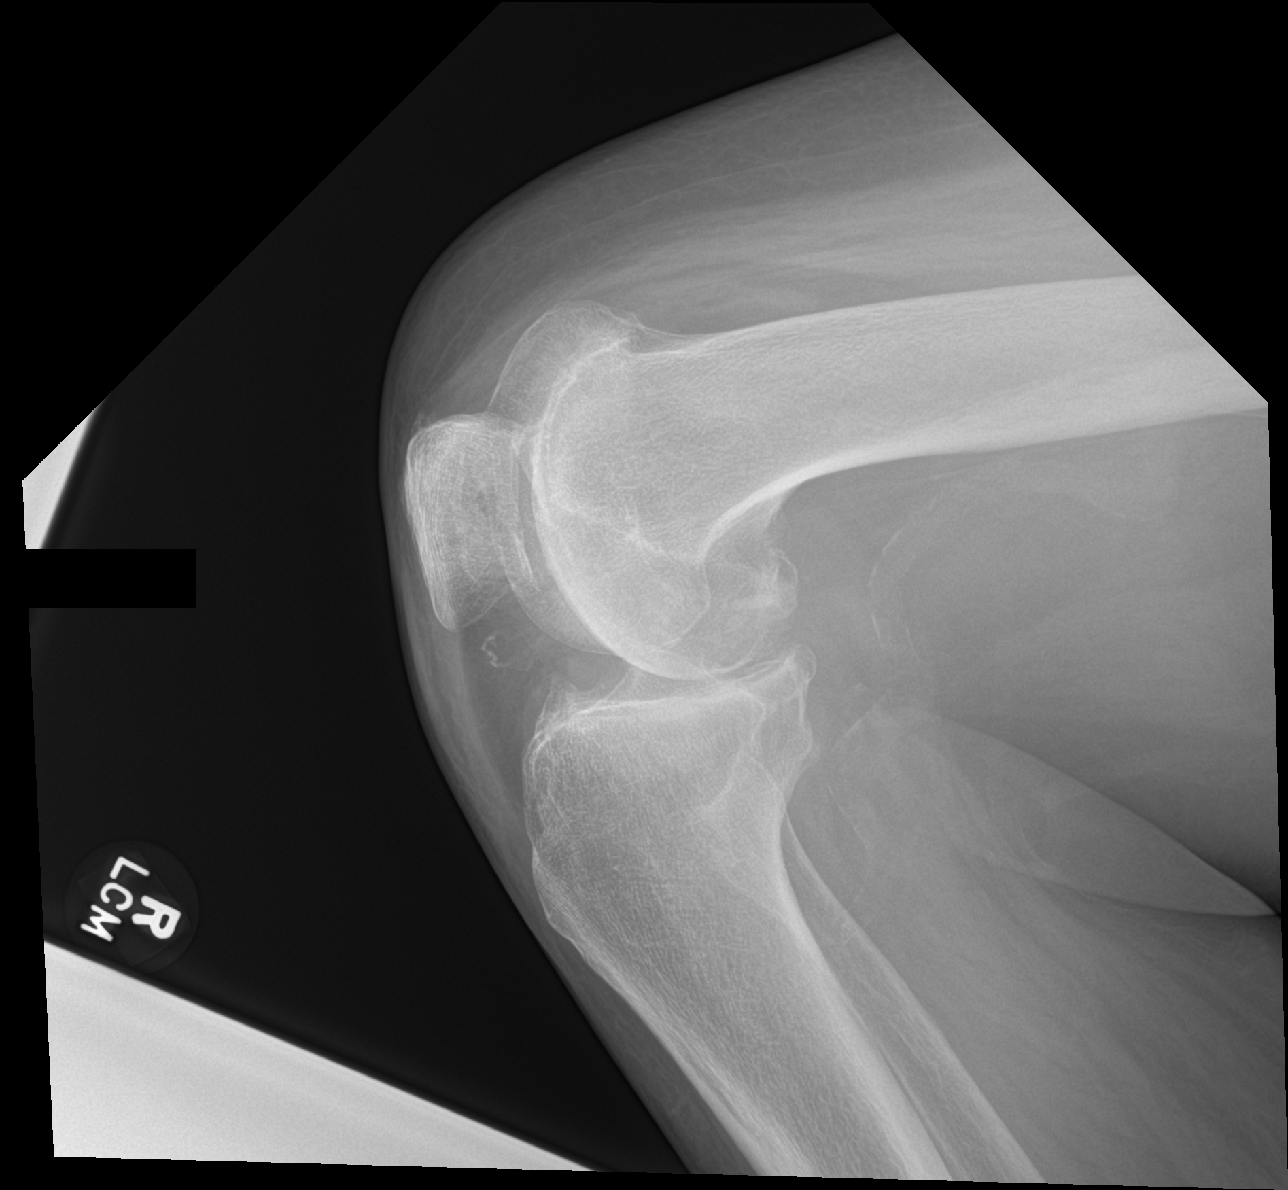

[4 of 4 positions shown; findings below may reference images not displayed]

FINDINGS: There is narrowing of the medial compartment with associated
osteophytosis. No joint effusion. No acute fracture dislocation.
IMPRESSION: 1. Osteoarthritis of the medial compartment.
2. No acute findings.

## 2022-10-28 IMAGING — DX DG CHEST 1V PORT
1 series · 1 of 1 positions shown · non-contrast
Comparison: 01/27/11

CLINICAL DATA: Altered mental status.

EXAM:
PORTABLE CHEST 1 VIEW

[chest]
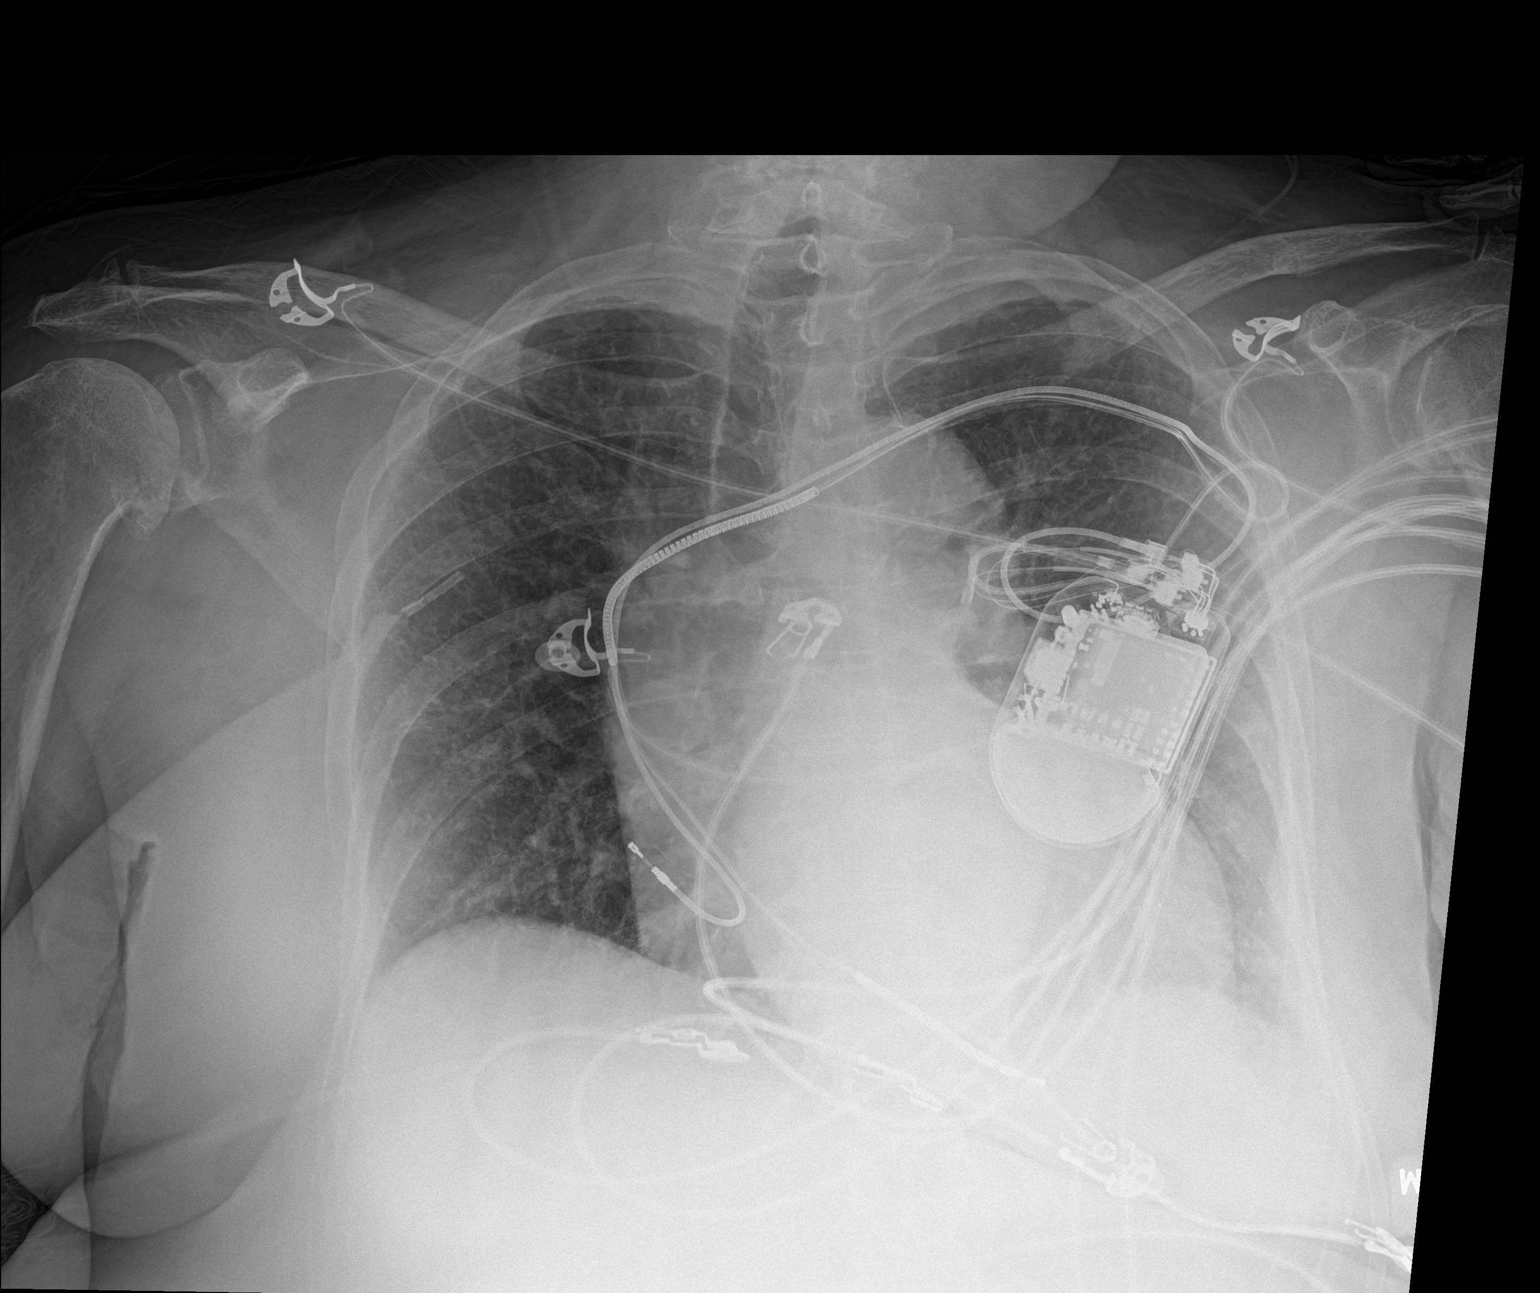

[1 of 1 positions shown; findings below may reference images not displayed]

FINDINGS: There is a left chest wall ICD with leads in the right atrial
appendage and right ventricle. Normal cardiomediastinal contours. No
pleural effusion. Pulmonary vascular congestion. No airspace
densities. Small, 1.9 cm indeterminate linear foreign body projects
over the right upper lobe between the right fifth and sixth rib
spaces which is of uncertain significance and may be external to the
patient. The visualized osseous structures are unremarkable.
IMPRESSION: 1. Pulmonary vascular congestion.
2. Small, indeterminate linear foreign body projects over the right
upper lobe between the right fifth and 6 rib spaces which is of
uncertain significance and may be external to the patient.
Correlation with physical exam findings advised.

## 2022-10-29 IMAGING — CT CT HEAD W/O CM
4 of 5 series · 15 of 47 positions shown, 17 images · non-contrast
Comparison: Two days ago

CLINICAL DATA: Delirium



[Series 3: head wo · axial · 0.39mm/px · z∈[-104,-24]mm · 4 of 28 slices shown]
[im 6/28  brain]
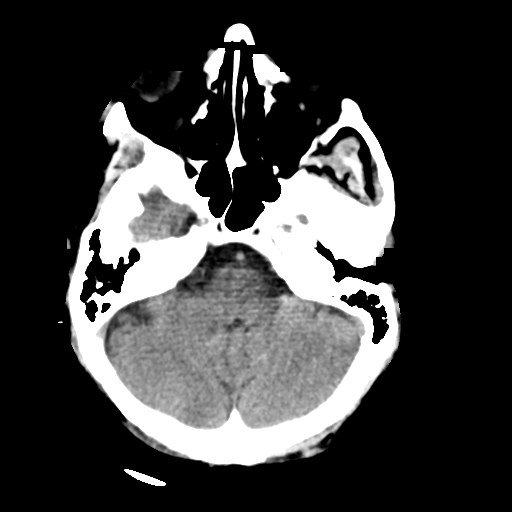
[im 11/28  brain]
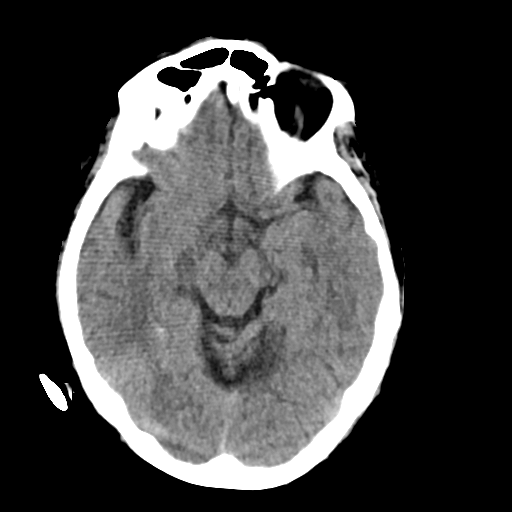
[im 17/28  brain]
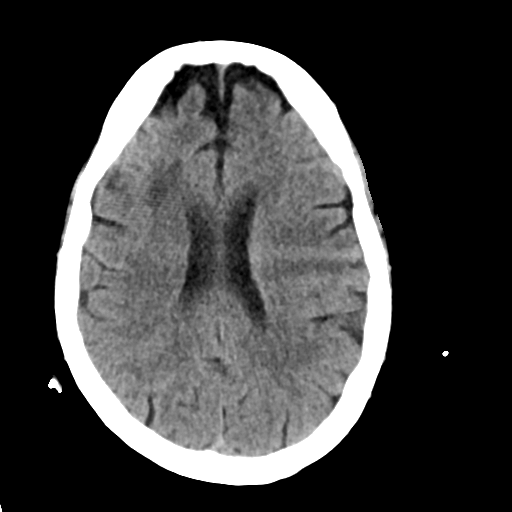
[im 22/28  brain]
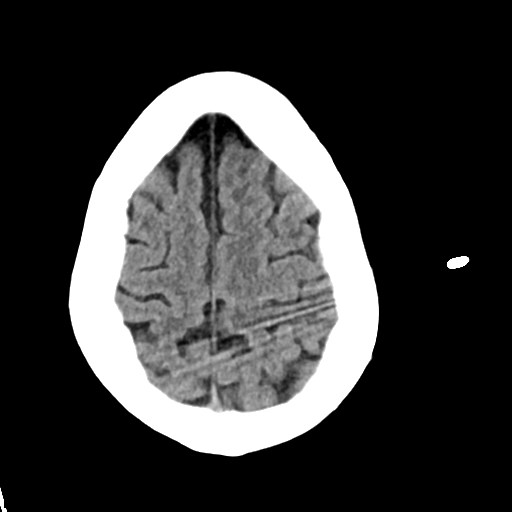

[Series 4: head (person_name) · axial · 0.39mm/px · z∈[-108,-18]mm · 5 of 28 slices shown, 7 images]
[im 5/28  brain]
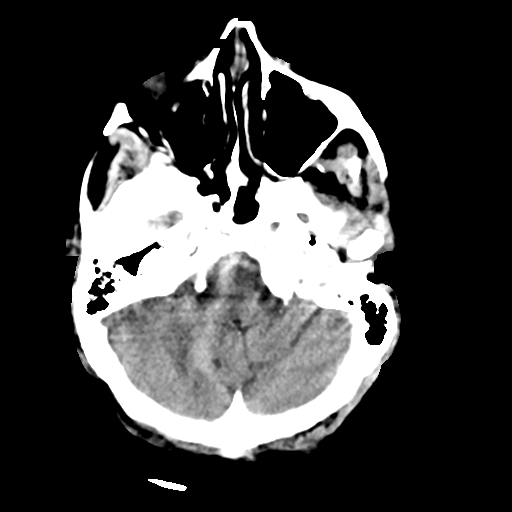
[im 5/28  bone]
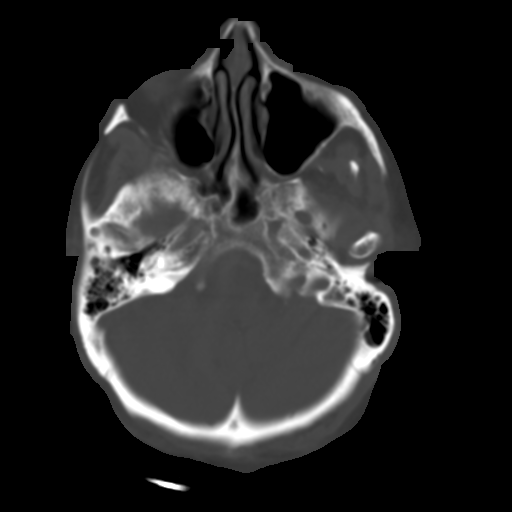
[im 10/28  brain]
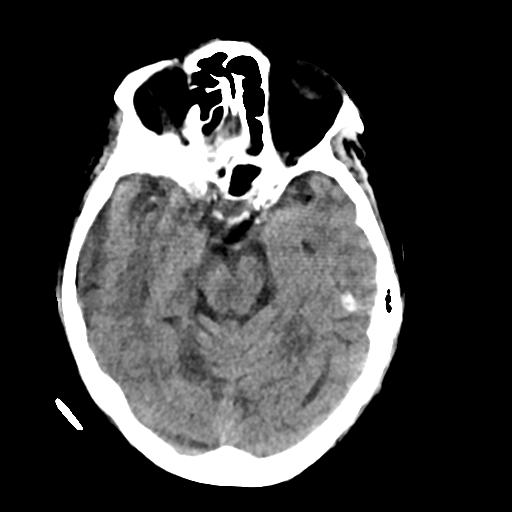
[im 14/28  brain]
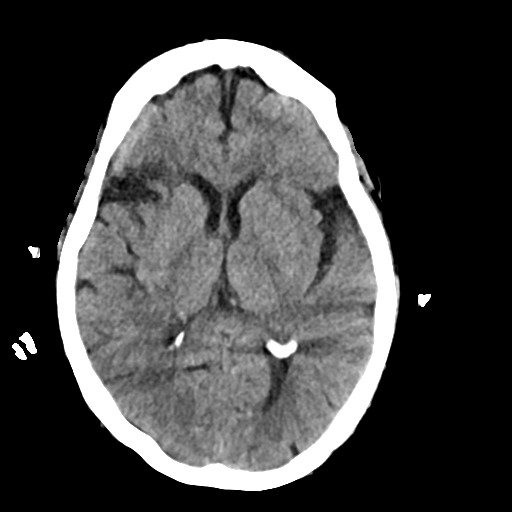
[im 19/28  brain]
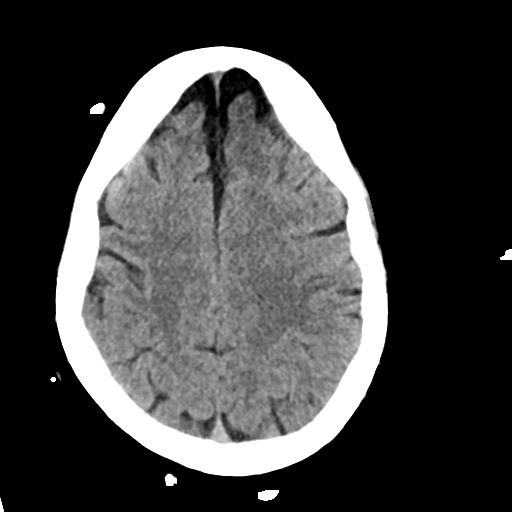
[im 23/28  brain]
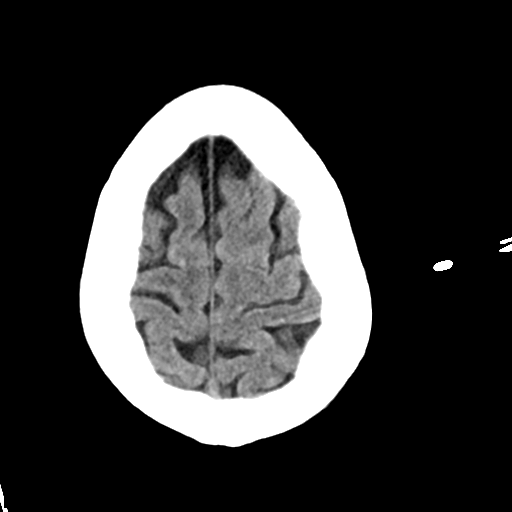
[im 23/28  bone]
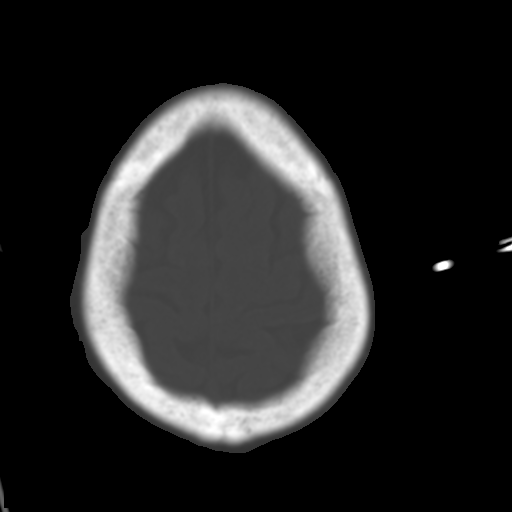

[Series 6: cor soft · coronal · 0.27mm/px · 3 of 66 slices shown]
[im 22/66  brain]
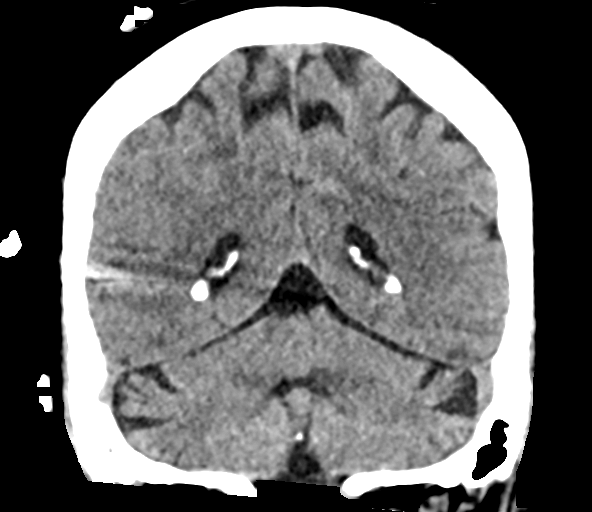
[im 29/66  brain]
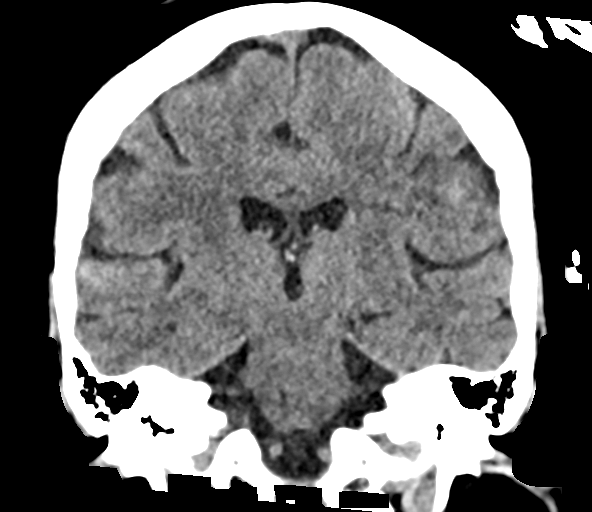
[im 37/66  brain]
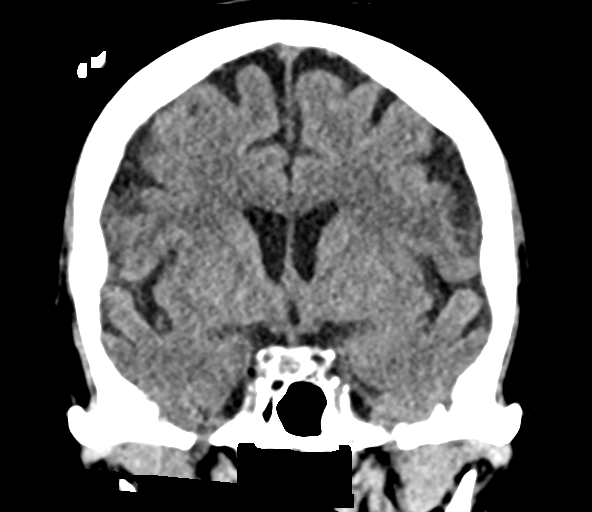

[Series 7: sag soft · sagittal · 0.27mm/px · 3 of 55 slices shown]
[im 19/55  brain]
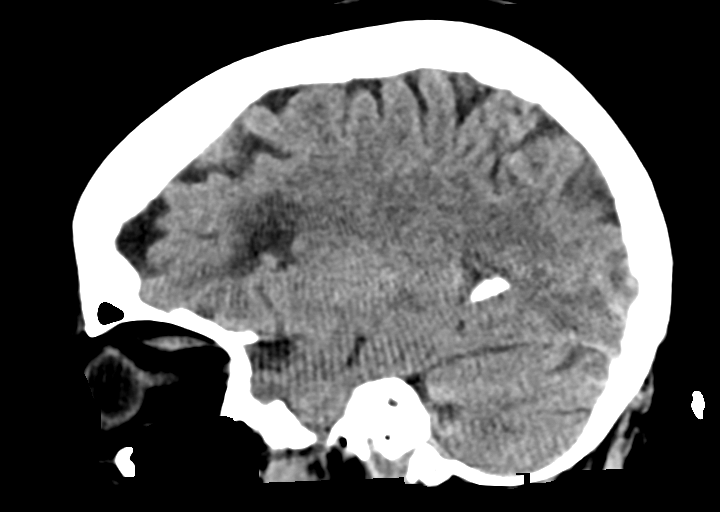
[im 28/55  brain]
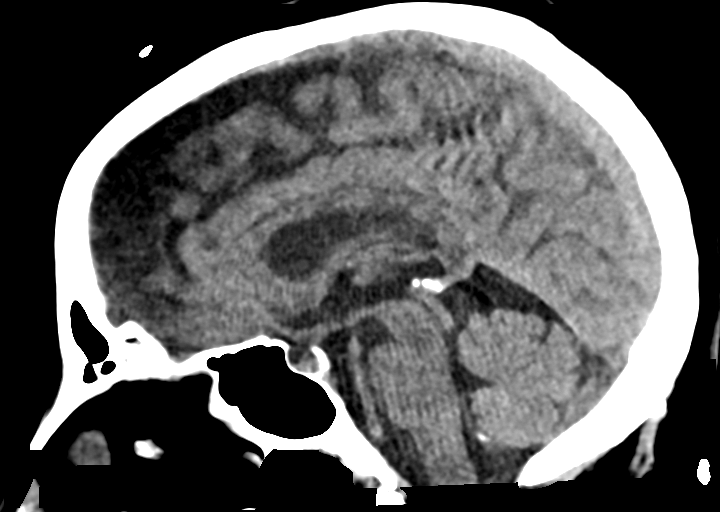
[im 37/55  brain]
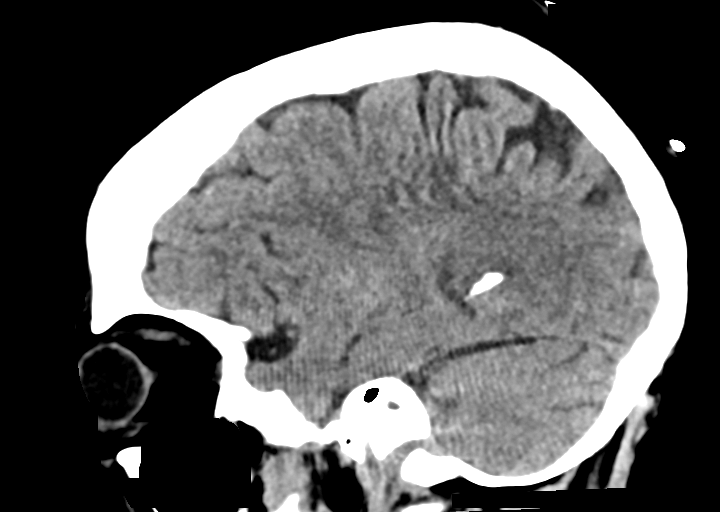

[15 of 47 positions shown; findings below may reference images not displayed]

FINDINGS: Brain: Multilevel unavoidable artifact from EEG hardware. Moderate
size remote right frontal infarct. No evidence of acute infarct,
hemorrhage, hydrocephalus, or collection.

Vascular: No hyperdense vessel or unexpected calcification.

Skull: Normal. Negative for fracture or focal lesion.

Sinuses/Orbits: Bilateral cataract resection
IMPRESSION: 1. No acute finding.
2. Remote right frontal infarct.

## 2022-11-03 ENCOUNTER — Other Ambulatory Visit: Payer: Self-pay | Admitting: Internal Medicine

## 2022-11-03 NOTE — Progress Notes (Deleted)
Cardiology Office Note Date:  11/03/2022  Patient ID:  [name], [name] 02/05/1946, MRN [id number] PCP:  Pcp, No  Cardiologist:  Dr. Shirlee Latch EP: Dr. Johney Frame >> Dr. Elberta Fortis     Chief Complaint:    *** post hospital  History of Present Illness: [name] is a 77 y.o. female with history of CAD (s/p LAD and RCA PCI.  Last LHC in 11/12 with patent proximal LAD stent, ostial 70% D1 (jailed by stent), mild LAD stent patent, patent RCA stents), ICM, chronic CHF, VT, ICD, AFib, stroke, DM, HTN, HLD, CKD (III), gastroparesis, Hypothyroidism.  Admitted 06/11/22 with VT (slow) and required DCCV > VT one zone (monitor only) rate reduced to 139bpm VT 2 zone rate reduced to 164bpm Tx with amiodarone gtt ? If triggered by GI illness Discharged 06/15/22  Admitted 10/02/22 with GI illness she felt was 2/2 Ranexa recurrent diarrhea, again presented in VT, Ranexa stopped  Admitted 10/12/22 device therapies delivered for VF, reloaded amiodarone.  LHC with CAD, patent stent LAD, Cx disease, vessel too small to intervene, no targets for PCI.Also noted poorly controlled DM< she was off her Jardiance 2/2 cost, with advisement to follow up with her PMD   *** amio labs *** VT? *** AF burden *** eliquis, dose, bleeding    Device information SJM dual chamber ICD, implanted 2003,  2007, gen changes 2013, Jan 2022 2003 had syncope >> EP study with inducible PMVT + h/o PMVT 2012 with ICD shocks 04/24/21 appropriate tx for VT w/HV tx 06/11/22: VT under detection > zones adjusted  July 2024 (x2)   At the time of her gen change Jan 2022,  SJM Riata ST 7040 lead was fluoroscopically and electrically normal today, opted to use this lead rather than replacing it after a long discussion with the patient prior to the procedure.   AAD Hx 2007 amiodarone started quickly stopped 2/2 nausea Remotely as well tried on Sotalol and Multaq unclear when/why they were stopped 2012 restarted on  amiodarone   Past Medical History:  Diagnosis Date   Allergy    Arthritis    Atrial fibrillation (HCC)    controlled with amiodarone, on coumadin   Chronic renal insufficiency    Chronic systolic heart failure (HCC)    Coronary artery disease 01/31/2011   Diabetes mellitus    type 1   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    History of chicken pox    Hyperlipidemia    Hypertension    Ischemic cardiomyopathy    Lumbar spondylosis 01/11/2012   Sleep apnea    Stroke [facility]) 2010   eye doctor said she had TIA   Ventricular tachycardia (HCC)    Polymorphic    Past Surgical History:  Procedure Laterality Date   ABDOMINAL HYSTERECTOMY  1995   CARDIAC DEFIBRILLATOR PLACEMENT     CHOLECYSTECTOMY  1985   COLONOSCOPY WITH PROPOFOL N/A 02/24/2018   Procedure: COLONOSCOPY WITH PROPOFOL;  Surgeon: Rachael Fee, MD;  Location: [facility] ENDOSCOPY;  Service: Endoscopy;  Laterality: N/A;   ICD  2003/2007   implanted by Dr Alanda Amass, most recent generator change 2/13 by Dr Johney Frame, Analyze ST study patient   ICD GENERATOR CHANGEOUT N/A 04/11/2020   Procedure: ICD GENERATOR CHANGEOUT;  Surgeon: Hillis Range, MD;  Location: [facility] INVASIVE CV LAB;  Service: Cardiovascular;  Laterality: N/A;   IMPLANTABLE CARDIOVERTER DEFIBRILLATOR (ICD) GENERATOR CHANGE N/A 05/05/2011   Procedure: ICD GENERATOR CHANGE;  Surgeon: Hillis Range, MD;  Location: [facility] CATH  LAB;  Service: Cardiovascular;  Laterality: N/A;   LEFT HEART CATH AND CORONARY ANGIOGRAPHY N/A 10/14/2022   Procedure: LEFT HEART CATH AND CORONARY ANGIOGRAPHY;  Surgeon: Kathleene Hazel, MD;  Location: [facility] INVASIVE CV LAB;  Service: Cardiovascular;  Laterality: N/A;   LEFT HEART CATHETERIZATION WITH CORONARY ANGIOGRAM N/A 01/30/2011   Procedure: LEFT HEART CATHETERIZATION WITH CORONARY ANGIOGRAM;  Surgeon: Laurey Morale, MD;  Location: [facility] CATH LAB;  Service: Cardiovascular;  Laterality: N/A;   POLYPECTOMY  02/24/2018   Procedure:  POLYPECTOMY;  Surgeon: Rachael Fee, MD;  Location: [facility] ENDOSCOPY;  Service: Endoscopy;;   TUBALIGATION  1980    Current Outpatient Medications  Medication Sig Dispense Refill   acetaminophen (TYLENOL) 325 MG tablet Take 650 mg by mouth every 6 (six) hours as needed for moderate pain.     [START ON 11/15/2022] amiodarone (PACERONE) 200 MG tablet Take 1 tablet (200 mg total) by mouth daily. 30 tablet 6   amiodarone (PACERONE) 400 MG tablet Take one 400 mg tablet once in am and once in evening for 2 weeks (through 10/30/22), then decrease to one 400 mg daily for 2 weeks (through 11/14/22), then start 200mg  daily (separate prescription) 42 tablet 0   aspirin EC 81 MG tablet Take 1 tablet (81 mg total) by mouth daily. Swallow whole. 30 tablet 12   Blood Glucose Monitoring Suppl (ACCU-CHEK NANO SMARTVIEW) W/DEVICE KIT Use to test blood sugar 4 times daily as instructed. Dx code: E11.59 1 kit 0   busPIRone (BUSPAR) 5 MG tablet Take 1 tablet (5 mg total) by mouth 2 (two) times daily. 60 tablet 1   carvedilol (COREG) 25 MG tablet Take 1 tablet (25 mg total) by mouth 2 (two) times daily with a meal. 60 tablet 3   Cholecalciferol (VITAMIN D3) 2000 UNITS TABS Take 2,000 Units by mouth every morning.     dicyclomine (BENTYL) 10 MG capsule TAKE 1 CAPSULE (10 MG TOTAL) BY MOUTH 4 (FOUR) TIMES DAILY BEFORE MEALS AND AT BEDTIME. (Patient taking differently: Take 20 mg by mouth 2 (two) times daily.) 120 capsule 1   Dulaglutide (TRULICITY) 3 MG/0.5ML SOPN Inject 3 mg into the skin once a week. (Patient taking differently: Inject 3 mg into the skin once a week. Mondays) 2 mL 11   ELIQUIS 5 MG TABS tablet TAKE 1 TABLET BY MOUTH TWICE A DAY (Patient taking differently: Take 5 mg by mouth 2 (two) times daily.) 60 tablet 11   empagliflozin (JARDIANCE) 10 MG TABS tablet Take 1 tablet (10 mg total) by mouth every morning. 90 tablet 3   fluticasone (FLONASE) 50 MCG/ACT nasal spray Place 1 spray into both nostrils daily as  needed for allergies or rhinitis.     furosemide (LASIX) 20 MG tablet TAKE 1 TABLET BY MOUTH EVERY DAY (Patient taking differently: Take 20 mg by mouth 2 (two) times daily.) 90 tablet 1   gabapentin (NEURONTIN) 100 MG capsule Take 2 capsules (200 mg total) by mouth 2 (two) times daily. 360 capsule 3   insulin aspart (NOVOLOG FLEXPEN) 100 UNIT/ML FlexPen Inject up to 15 units daily under skin as advised (Patient taking differently: Inject 0-15 Units into the skin 3 (three) times daily. Sliding scale) 15 mL 11   insulin detemir (LEVEMIR) 100 UNIT/ML injection Inject 0.14 mLs (14 Units total) into the skin at bedtime.     levETIRAcetam (KEPPRA) 250 MG tablet TAKE 1 TABLET BY MOUTH TWICE A DAY 180 tablet 1   loperamide (IMODIUM) 2 MG  capsule Take 1 capsule (2 mg total) by mouth as needed for diarrhea or loose stools. (Patient taking differently: Take 2 mg by mouth daily as needed for diarrhea or loose stools.) 30 capsule 0   losartan (COZAAR) 25 MG tablet Take 1 tablet (25 mg total) by mouth daily. (Patient taking differently: Take 25 mg by mouth every evening.) 30 tablet 11   magnesium oxide (MAG-OX) 400 (240 Mg) MG tablet TAKE 1 TABLET BY MOUTH TWICE A DAY (Patient taking differently: Take 400 mg by mouth 2 (two) times daily.) 180 tablet 1   Multiple Vitamins-Minerals (EYE VITAMINS PO) Take 1 tablet by mouth 2 (two) times daily.     nitroGLYCERIN (NITROSTAT) 0.4 MG SL tablet Place 1 tablet (0.4 mg total) under the tongue every 5 (five) minutes as needed for chest pain. 25 tablet 1   omeprazole (PRILOSEC OTC) 20 MG tablet Take 20 mg by mouth every morning.     rosuvastatin (CRESTOR) 40 MG tablet Take 1 tablet (40 mg total) by mouth daily. NEEDS FOLLOW UP APPOINTMENT FOR MORE REFILLS (Patient taking differently: Take 40 mg by mouth every evening.) 90 tablet 0   sertraline (ZOLOFT) 50 MG tablet Take 50 mg by mouth at bedtime.     No current facility-administered medications for this visit.    Allergies:    Veltassa [patiromer], Darvon, Mexiletine hcl, Promethazine hcl, Ranexa [ranolazine er], Spironolactone, and Plavix [clopidogrel bisulfate]   Social History:  The patient  reports that she quit smoking about 27 years ago. Her smoking use included cigarettes. She has never used smokeless tobacco. She reports that she does not drink alcohol and does not use drugs.   Family History:  The patient's family history includes Breast cancer in her sister; Colon cancer in her maternal aunt; Diabetes in her mother; Early death (age of onset: 3) in her brother; Heart attack in her father; Heart disease in her father and mother; Hyperlipidemia in her mother; Hypertension in her father and mother; Irritable bowel syndrome in her sister; Lung cancer in her sister; Seizures in her granddaughter.  ROS:  Please see the history of present illness. All other systems are reviewed and otherwise negative.   PHYSICAL EXAM:  VS:  There were no vitals taken for this visit. BMI: There is no height or weight on file to calculate BMI. Well nourished, well developed, in no acute distress  HEENT: normocephalic, atraumatic  Neck: no JVD, carotid bruits or masses Cardiac: *** RRR; no significant murmurs, no rubs, or gallops Lungs: *** CTA b/l, no wheezing, rhonchi or rales  Abd: soft, nontender MS: no deformity or atrophy Ext:  *** no edema Skin: warm and dry, no rash Neuro:  No gross deficits appreciated Psych: euthymic mood, full affect  *** ICD site is stable and well healed.  No skin changes, tenderness or fluctuation   EKG: not done today  ICD interrogation done today and reviewed by myself;  ***   10/14/22: LHC Prox RCA to Mid RCA lesion is 20% stenosed.   Dist RCA lesion is 40% stenosed.   Dist Cx lesion is 99% stenosed.   Mid LAD lesion is 20% stenosed.   Ost LAD to Mid LAD lesion is 20% stenosed.   Mid LAD to Dist LAD lesion is 20% stenosed.   2nd Diag lesion is 50% stenosed.   3rd Diag lesion is 40%  stenosed.   Ost LM lesion is 20% stenosed.   Patent mid LAD stent with minimal restenosis.  Severe stenosis  in the very distal AV groove Circumflex artery leading into a very small obtuse marginal branch-too small for PCI Large dominant RCA with patent proximal and mid stents with minimal restenosis.  NO targets for PCI LVEDP 15 mmHg   Recommendations: Continue medical management of CAD. Continue ASA and resume Eliquis tomorrow. Given ACS, would consider adding Plavix but she has a Plavix allergy. I would not add Brilinta or Effient given advanced age and need for Eliquis.    08/31/21: TTE 1. Left ventricular ejection fraction, by estimation, is 40%. The left  ventricle has moderately decreased function. The left ventricle  demonstrates regional wall motion abnormalities (see scoring  diagram/findings for description). There is mild  concentric left ventricular hypertrophy. Left ventricular diastolic  parameters are consistent with Grade I diastolic dysfunction (impaired  relaxation). There is hypokinesis of the left ventricular, basal-mid  inferoseptal wall and inferior wall.   2. Right ventricular systolic function was not well visualized. The right  ventricular size is not well visualized.   3. Left atrial size was moderately dilated.   4. Right atrial size was mildly dilated.   5. The mitral valve is normal in structure. Trivial mitral valve  regurgitation. No evidence of mitral stenosis.   6. The aortic valve is grossly normal. There is mild calcification of the  aortic valve. Aortic valve regurgitation is mild. Aortic valve sclerosis  is present, with no evidence of aortic valve stenosis.   7. The inferior vena cava is normal in size with greater than 50%  respiratory variability, suggesting right atrial pressure of 3 mmHg.   Comparison(s): No significant change from prior study. Prior images  reviewed side by side.      01/14/21: TTE  1. Left ventricular ejection fraction,  by estimation, is 40%. The left  ventricle has mild to moderately decreased function. The left ventricle  demonstrates regional wall motion abnormalities with basal to mid inferior  and inferoseptal severe  hypokinesis. Left ventricular diastolic parameters are consistent with  Grade I diastolic dysfunction (impaired relaxation).   2. Right ventricular systolic function is normal. The right ventricular  size is normal. The estimated right ventricular systolic pressure is 25.0  mmHg.   3. Left atrial size was mildly dilated.   4. The mitral valve is normal in structure. Trivial mitral valve  regurgitation. No evidence of mitral stenosis.   5. The aortic valve is tricuspid. Aortic valve regurgitation is mild. No  aortic stenosis is present.   6. The inferior vena cava is normal in size with <50% respiratory  variability, suggesting right atrial pressure of 8 mmHg.   06/27/2019: TTE IMPRESSIONS   1. Left ventricular ejection fraction, by estimation, is 40 to 45%. The  left ventricle has mildly decreased function. The left ventricle  demonstrates regional wall motion abnormalities, infeiror and inferoseptal  hypokinesis. There is mild left  ventricular hypertrophy. Left ventricular diastolic parameters are  consistent with Grade I diastolic dysfunction (impaired relaxation).   2. Right ventricular systolic function is normal. The right ventricular  size is normal. Tricuspid regurgitation signal is inadequate for assessing  PA pressure.   3. Left atrial size was mildly dilated.   4. The mitral valve is normal in structure. No evidence of mitral valve  regurgitation. No evidence of mitral stenosis.   5. The aortic valve is tricuspid. Aortic valve regurgitation is mild. No  aortic stenosis is present.   6. The inferior vena cava is normal in size with greater than 50%  respiratory variability, suggesting right atrial pressure of 3 mmHg.    08/16/2018: TTE IMPRESSIONS   1. The left  ventricle has a visually estimated ejection fraction of 40%.  The cavity size was normal. There is moderately increased left ventricular  wall thickness. Left ventricular diastolic Doppler parameters are  consistent with impaired relaxation.  Inferior and inferoseptal severe hypokinesis.   2. The right ventricle has normal systolic function. The cavity was  normal. There is no increase in right ventricular wall thickness.   3. Left atrial size was mildly dilated.   4. There is mild to moderate mitral annular calcification present. No  evidence of mitral valve stenosis. No significant regurgitation.   5. The aortic valve is tricuspid. Moderate calcification of the aortic  valve. Aortic valve regurgitation is mild by color flow Doppler. No  stenosis of the aortic valve.   6. The aortic root is normal in size and structure.   7. Normal IVC. No complete TR doppler jet so unable to estimate PA  systolic pressure.    Recent Labs: 10/02/2022: ALT 45; TSH 2.833 10/12/2022: B Natriuretic Peptide 2,603.7 10/14/2022: Magnesium 2.1 10/15/2022: Hemoglobin 10.2; Platelets 213 10/16/2022: BUN 23; Creatinine, Ser 1.71; Potassium 5.0; Sodium 138  12/18/2021: Cholesterol 125; HDL 50; LDL Cholesterol 66; Total CHOL/HDL Ratio 2.5; Triglycerides 44; VLDL 9   CrCl cannot be calculated (Unknown ideal weight.).   Wt Readings from Last 3 Encounters:  10/13/22 190 lb 4.8 oz (86.3 kg)  10/03/22 191 lb 12.8 oz (87 kg)  07/21/22 186 lb (84.4 kg)     Other studies reviewed: Additional studies/records reviewed today include: summarized above  ASSESSMENT AND PLAN:  1. ICD     ***  2. ICM 3. Chronic CHF (systolic)     *** Follows closely with Dr. Shirlee Latch and the HF team  4. CAD      *** No symptoms of angina      Follows with Dr. Shirlee Latch  5. Paroxysmal Afib     CHA2DS2Vasc is 9, on Eliquis, appropriately dosed     4.5% burden  6. HTN     *** No changes today  7. HLD     *** Not addressed today  8.  Recurrent VT storms 9. VF noted now as well     Re-loaded amiodarone     ***    Disposition: ***    Current medicines are reviewed at length with the patient today.  The patient did not have any concerns regarding medicines.  Norma Fredrickson, PA-C 11/03/2022 7:48 AM     [facility] [address] [place] [address] [phone] (office)  [phone] (fax)

## 2022-11-04 ENCOUNTER — Ambulatory Visit: Payer: Medicare HMO | Attending: Physician Assistant | Admitting: Physician Assistant

## 2022-11-05 ENCOUNTER — Telehealth: Payer: Self-pay | Admitting: Cardiology

## 2022-11-05 NOTE — Telephone Encounter (Signed)
Patient unsure of who called her today. No documentation of any attempt to contact patient recently in chart. Informed patient caller will likely callback if needed. Patient verbalized understanding and expressed appreciation for follow-up.

## 2022-11-05 NOTE — Telephone Encounter (Signed)
Patient is returning phone call.  °

## 2022-11-06 ENCOUNTER — Other Ambulatory Visit: Payer: Self-pay | Admitting: Internal Medicine

## 2022-11-06 MED ORDER — DAPAGLIFLOZIN PROPANEDIOL 10 MG PO TABS: 10.0000 mg | ORAL_TABLET | Freq: Every day | ORAL | 3 refills | Status: DC

## 2022-11-06 NOTE — Telephone Encounter (Signed)
Farxiga sent instead.  If not affordable, we may need to go to the patient assistance program.

## 2022-11-09 ENCOUNTER — Ambulatory Visit: Payer: Medicare HMO | Admitting: Neurology

## 2022-11-19 ENCOUNTER — Telehealth: Payer: Self-pay

## 2022-11-19 ENCOUNTER — Encounter: Payer: Self-pay | Admitting: Internal Medicine

## 2022-11-19 ENCOUNTER — Ambulatory Visit: Payer: Medicare HMO | Admitting: Internal Medicine

## 2022-11-19 VITALS — BP 120/68 | HR 60 | Ht 63.0 in | Wt 187.6 lb

## 2022-11-19 DIAGNOSIS — E1165 Type 2 diabetes mellitus with hyperglycemia: Secondary | ICD-10-CM | POA: Diagnosis not present

## 2022-11-19 DIAGNOSIS — Z7984 Long term (current) use of oral hypoglycemic drugs: Secondary | ICD-10-CM

## 2022-11-19 DIAGNOSIS — Z794 Long term (current) use of insulin: Secondary | ICD-10-CM

## 2022-11-19 DIAGNOSIS — E1142 Type 2 diabetes mellitus with diabetic polyneuropathy: Secondary | ICD-10-CM | POA: Diagnosis not present

## 2022-11-19 DIAGNOSIS — E785 Hyperlipidemia, unspecified: Secondary | ICD-10-CM

## 2022-11-19 DIAGNOSIS — E1159 Type 2 diabetes mellitus with other circulatory complications: Secondary | ICD-10-CM

## 2022-11-19 DIAGNOSIS — Z7985 Long-term (current) use of injectable non-insulin antidiabetic drugs: Secondary | ICD-10-CM

## 2022-11-19 MED ORDER — FREESTYLE LIBRE 3 SENSOR MISC: 1.0000 | 3 refills | Status: DC

## 2022-11-19 MED ORDER — FREESTYLE LIBRE 3 READER DEVI: 1.0000 | Freq: Once | 0 refills | Status: AC

## 2022-11-19 NOTE — Patient Instructions (Addendum)
Please continue: - Trulicity 3 mg weekly in a.m. - Tresiba 12 units at bedtime  Change: - Novolog  7-9 units before b'fast and lunch 4-5 units before dinner  Try to restart: - Jardiance 10 mg before breakfast  NO JUICE, REGULAR SODAS, SWEET TEA!  Please return in 3 months with your sugar log.

## 2022-11-19 NOTE — Telephone Encounter (Signed)
Sample  Medication:Jardiance Dose: 10 mg Quantity: 2 boxes ZOX:[id number] EXP:05/2024  Dicie Beam

## 2022-11-19 NOTE — Progress Notes (Signed)
Patient ID: [name], female   DOB: Dec 09, 1945, 77 y.o.   MRN: [id number]  HPI: [name] is a 77 y.o.-year-old female, presenting for f/u for DM2, insulin-dependent, uncontrolled, with complications (CAD, ICM - had ICD, peripheral neuropathy, gastroparesis). Last visit 8 months ago. She is here with her son.  Interim history: No increased urination, blurry vision, nausea, chest pain. She has knee pain - gel injections, getting nerve ablation.  Since last visit, she was admitted 3 times, in 05/2022 with tachycardia, then in 09/2022 with ventricular tachycardia and then ventricular fibrillation. Her Amiodarone dose was adjusted.  Reviewed HbA1c levels: Lab Results  Component Value Date   HGBA1C 9.2 (H) 10/02/2022   HGBA1C 6.2 (A) 03/13/2022   HGBA1C 7.1 (A) 11/11/2021  11/08/2020: HbA1c 8.5% 09/25/2020: HbA1c 10.3% 05/13/2020: HbA1c 9.6% 01/22/2020: HbA1c 8% 03/13/2019: HbA1c 7.1% 09/26/2020: Glucose 197, C-peptide 7.76 (1.1-4.4)  She is on: - Jardiance 10 mg before breakfast - tolerated well >> 500$ for 1 mo in doughnut hole (out for >1 mo) >> restarted >> now again expensive in the doughnut hole - Levemir 25 >> 20 >> Levemir 12 units at bedtime (did not start [place] yet) - Novolog 7 >> 5 >> 7-9  units before meals - Trulicity 0.75 >> 1.5 mg weekly in a.m. - no nausea >> 800$ for 1.5 mo in doughnut hole (out for >1 mo) >> restarted >> 3 mg weekly Metformin was stopped at the recommendation of her nephrologist in 2022. She was previously on Farxiga but stopped due to side effects (somnolence?). Lantus caused weight gain.  Pt checks her sugars 3-4 times a day - per log: - am:  96-114, 124 >> 96, 105-145, 164 >> 101-130, 148, 201 >> 102-140, 160, 180 - after b'fast: 175 >> n/c - before lunch: 85-138, 142, 191 >> 102-125 >> 110-180 >> 115-163 >> 125-160, 275 - after lunch:  45, 47, 140-150 >> n/c - before dinner: 88-135 >> 63, 96-120 >> 97, 130-182 >> 105-190 >> 132-176,  324, 445 - bedtime: 91-185, 216, 221 >> 108-123 >> 117-210, 325 >> 144-299 >> 125-180, 210 - night: 45, 54 Lowest sugar was 58 >> 50s at night >> 63 >> 45 (small dinner);  she has hypoglycemia awareness in the 90s. Highest sugar was 515 (steroid) >> 221 >> 325 >> 445.  -+ CKD, last BUN/creatinine:   Lab Results  Component Value Date   BUN 23 10/16/2022   CREATININE 1.71 (H) 10/16/2022   GFR low -she sees nephrology: Lab Results  Component Value Date   GFRNONAA 31 (L) 10/16/2022   GFRNONAA 28 (L) 10/15/2022   GFRNONAA 28 (L) 10/14/2022   GFRNONAA 22 (L) 10/13/2022   GFRNONAA 18 (L) 10/12/2022   ACR was high at last check:  09/26/2020: ACR 55.3 Lab Results  Component Value Date   MICRALBCREAT 5.3 08/16/2018   MICRALBCREAT 9.4 04/08/2017   MICRALBCREAT 3.2 06/25/2016   MICRALBCREAT 1.6 12/21/2014   MICRALBCREAT 1.0 12/15/2012  On Entresto.  -+ HL; last set of lipids: Lab Results  Component Value Date   CHOL 125 12/18/2021   HDL 50 12/18/2021   LDLCALC 66 12/18/2021   TRIG 44 12/18/2021   CHOLHDL 2.5 12/18/2021  On Crestor 20 >> 40.  - last eye exam was in 2018: No DR  - she has numbness and tingling in her feet-on Neurontin.  She was referred to a pain clinic by her PCP.  Last foot exam 10/2021.  She is on Eliquis. On  amiodarone.  She has a history of mild hypothyroidism; latest TSH was normal: Lab Results  Component Value Date   TSH 2.833 10/02/2022   TSH 3.640 06/11/2022   TSH 2.506 12/18/2021   TSH 2.518 08/31/2021   TSH 2.13 04/02/2021   TSH 0.223 (L) 01/14/2021   TSH 0.726 05/29/2020   TSH 3.02 08/16/2018   TSH 3.272 08/16/2018   TSH 4.468 08/02/2017   TSH 6.03 (H) 04/08/2017   TSH 4.01 06/25/2016   TSH 4.360 01/07/2016   TSH 2.745 07/30/2015   TSH 3.29 12/21/2014   TSH 2.697 08/08/2014   TSH 3.620 12/05/2013   TSH 1.72 05/17/2013   TSH 2.83 04/03/2013   TSH 1.91 11/10/2012   She was on levothyroxine 25 mcg daily, but currently off.   She has  no family history of medullary thyroid cancer or personal history of pancreatitis. Her 60 y/o son passed away in 06-18-18 - had cirrhosis >> developed cough, fever, generalized pain+ bilateral PNA (Covid 19 negative).   Her nephew died in July 19, 2018 and her brother died 11-18-18.    She saw endocrinology at [facility], with the last office visit in 09/2020.  Her diabetic regimen was changed at that time (Trulicity and Jardiance added).  Sugars improved.  ROS: + see HPI  I reviewed pt's medications, allergies, PMH, social hx, family hx, and changes were documented in the history of present illness. Otherwise, unchanged from my initial visit note.  Past Medical History:  Diagnosis Date   Allergy    Arthritis    Atrial fibrillation (HCC)    controlled with amiodarone, on coumadin   Chronic renal insufficiency    Chronic systolic heart failure (HCC)    Coronary artery disease 01/31/2011   Diabetes mellitus    type 1   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    History of chicken pox    Hyperlipidemia    Hypertension    Ischemic cardiomyopathy    Lumbar spondylosis 01/11/2012   Sleep apnea    Stroke [facility]) 2010   eye doctor said she had TIA   Ventricular tachycardia (HCC)    Polymorphic   Past Surgical History:  Procedure Laterality Date   ABDOMINAL HYSTERECTOMY  1995   CARDIAC DEFIBRILLATOR PLACEMENT     CHOLECYSTECTOMY  1985   COLONOSCOPY WITH PROPOFOL N/A 02/24/2018   Procedure: COLONOSCOPY WITH PROPOFOL;  Surgeon: Rachael Fee, MD;  Location: [facility] ENDOSCOPY;  Service: Endoscopy;  Laterality: N/A;   ICD  202007-03-28   implanted by Dr Alanda Amass, most recent generator change 2/13 by Dr Johney Frame, Analyze ST study patient   ICD GENERATOR CHANGEOUT N/A 04/11/2020   Procedure: ICD GENERATOR CHANGEOUT;  Surgeon: Hillis Range, MD;  Location: [facility] INVASIVE CV LAB;  Service: Cardiovascular;  Laterality: N/A;   IMPLANTABLE CARDIOVERTER DEFIBRILLATOR (ICD) GENERATOR CHANGE N/A  05/05/2011   Procedure: ICD GENERATOR CHANGE;  Surgeon: Hillis Range, MD;  Location: [facility] CATH LAB;  Service: Cardiovascular;  Laterality: N/A;   LEFT HEART CATH AND CORONARY ANGIOGRAPHY N/A 10/14/2022   Procedure: LEFT HEART CATH AND CORONARY ANGIOGRAPHY;  Surgeon: Kathleene Hazel, MD;  Location: [facility] INVASIVE CV LAB;  Service: Cardiovascular;  Laterality: N/A;   LEFT HEART CATHETERIZATION WITH CORONARY ANGIOGRAM N/A 01/30/2011   Procedure: LEFT HEART CATHETERIZATION WITH CORONARY ANGIOGRAM;  Surgeon: Laurey Morale, MD;  Location: [facility] CATH LAB;  Service: Cardiovascular;  Laterality: N/A;   POLYPECTOMY  02/24/2018   Procedure: POLYPECTOMY;  Surgeon: Rachael Fee, MD;  Location:  [facility] ENDOSCOPY;  Service: Endoscopy;;   TUBALIGATION  1980   Social History   Socioeconomic History   Marital status: Divorced    Spouse name: Not on file   Number of children: 3   Years of education: 12   Highest education level: Not on file  Occupational History   Occupation: Retired  Tobacco Use   Smoking status: Former    Current packs/day: 0.00    Types: Cigarettes    Quit date: 08/16/1995    Years since quitting: 27.2   Smokeless tobacco: Never  Vaping Use   Vaping status: Never Used  Substance and Sexual Activity   Alcohol use: No   Drug use: No   Sexual activity: Never    Birth control/protection: Post-menopausal  Other Topics Concern   Not on file  Social History Narrative   Regular exercise-no Caffeine Use- sometimes   Lives with family   Social Determinants of Health   Financial Resource Strain: Patient Declined (07/17/2022)   Received from [facility], [facility]   Overall Financial Resource Strain (CARDIA)    Difficulty of Paying Living Expenses: Patient declined  Food Insecurity: No Food Insecurity (10/12/2022)   Hunger Vital Sign    Worried About Running Out of Food in the Last Year: Never true    Ran Out of Food in the Last Year: Never true  Transportation Needs: Unmet  Transportation Needs (10/12/2022)   PRAPARE - Administrator, Civil Service (Medical): Yes    Lack of Transportation (Non-Medical): No  Physical Activity: Insufficiently Active (07/16/2022)   Received from [facility], [facility]   Exercise Vital Sign    Days of Exercise per Week: 1 day    Minutes of Exercise per Session: 10 min  Stress: Stress Concern Present (07/16/2022)   Received from [facility], [facility] - Occupational Stress Questionnaire    Feeling of Stress : To some extent  Social Connections: Socially Integrated (07/16/2022)   Received from [facility], [facility]   Social Network    How would you rate your social network (family, work, friends)?: Good participation with social networks  Intimate Partner Violence: Not At Risk (10/12/2022)   Humiliation, Afraid, Rape, and Kick questionnaire    Fear of Current or Ex-Partner: No    Emotionally Abused: No    Physically Abused: No    Sexually Abused: No   Current Outpatient Medications on File Prior to Visit  Medication Sig Dispense Refill   acetaminophen (TYLENOL) 325 MG tablet Take 650 mg by mouth every 6 (six) hours as needed for moderate pain.     amiodarone (PACERONE) 200 MG tablet Take 1 tablet (200 mg total) by mouth daily. 30 tablet 6   amiodarone (PACERONE) 400 MG tablet Take one 400 mg tablet once in am and once in evening for 2 weeks (through 10/30/22), then decrease to one 400 mg daily for 2 weeks (through 11/14/22), then start 200mg  daily (separate prescription) 42 tablet 0   aspirin EC 81 MG tablet Take 1 tablet (81 mg total) by mouth daily. Swallow whole. 30 tablet 12   Blood Glucose Monitoring Suppl (ACCU-CHEK NANO SMARTVIEW) W/DEVICE KIT Use to test blood sugar 4 times daily as instructed. Dx code: E11.59 1 kit 0   busPIRone (BUSPAR) 5 MG tablet Take 1 tablet (5 mg total) by mouth 2 (two) times daily. 60 tablet 1   carvedilol (COREG) 25 MG tablet  Take 1 tablet (25  mg total) by mouth 2 (two) times daily with a meal. 60 tablet 3   Cholecalciferol (VITAMIN D3) 2000 UNITS TABS Take 2,000 Units by mouth every morning.     dapagliflozin propanediol (FARXIGA) 10 MG TABS tablet Take 1 tablet (10 mg total) by mouth daily before breakfast. 90 tablet 3   dicyclomine (BENTYL) 10 MG capsule TAKE 1 CAPSULE (10 MG TOTAL) BY MOUTH 4 (FOUR) TIMES DAILY BEFORE MEALS AND AT BEDTIME. (Patient taking differently: Take 20 mg by mouth 2 (two) times daily.) 120 capsule 1   Dulaglutide (TRULICITY) 3 MG/0.5ML SOPN Inject 3 mg into the skin once a week. (Patient taking differently: Inject 3 mg into the skin once a week. Mondays) 2 mL 11   ELIQUIS 5 MG TABS tablet TAKE 1 TABLET BY MOUTH TWICE A DAY (Patient taking differently: Take 5 mg by mouth 2 (two) times daily.) 60 tablet 11   empagliflozin (JARDIANCE) 10 MG TABS tablet Take 1 tablet (10 mg total) by mouth every morning. 90 tablet 3   fluticasone (FLONASE) 50 MCG/ACT nasal spray Place 1 spray into both nostrils daily as needed for allergies or rhinitis.     furosemide (LASIX) 20 MG tablet TAKE 1 TABLET BY MOUTH EVERY DAY (Patient taking differently: Take 20 mg by mouth 2 (two) times daily.) 90 tablet 1   gabapentin (NEURONTIN) 100 MG capsule Take 2 capsules (200 mg total) by mouth 2 (two) times daily. 360 capsule 3   insulin aspart (NOVOLOG FLEXPEN) 100 UNIT/ML FlexPen Inject up to 15 units daily under skin as advised (Patient taking differently: Inject 0-15 Units into the skin 3 (three) times daily. Sliding scale) 15 mL 11   insulin detemir (LEVEMIR) 100 UNIT/ML injection Inject 0.14 mLs (14 Units total) into the skin at bedtime.     levETIRAcetam (KEPPRA) 250 MG tablet TAKE 1 TABLET BY MOUTH TWICE A DAY 180 tablet 1   loperamide (IMODIUM) 2 MG capsule Take 1 capsule (2 mg total) by mouth as needed for diarrhea or loose stools. (Patient taking differently: Take 2 mg by mouth daily as needed for diarrhea or loose  stools.) 30 capsule 0   losartan (COZAAR) 25 MG tablet Take 1 tablet (25 mg total) by mouth daily. (Patient taking differently: Take 25 mg by mouth every evening.) 30 tablet 11   magnesium oxide (MAG-OX) 400 (240 Mg) MG tablet TAKE 1 TABLET BY MOUTH TWICE A DAY (Patient taking differently: Take 400 mg by mouth 2 (two) times daily.) 180 tablet 1   Multiple Vitamins-Minerals (EYE VITAMINS PO) Take 1 tablet by mouth 2 (two) times daily.     nitroGLYCERIN (NITROSTAT) 0.4 MG SL tablet Place 1 tablet (0.4 mg total) under the tongue every 5 (five) minutes as needed for chest pain. 25 tablet 1   omeprazole (PRILOSEC OTC) 20 MG tablet Take 20 mg by mouth every morning.     rosuvastatin (CRESTOR) 40 MG tablet Take 1 tablet (40 mg total) by mouth daily. NEEDS FOLLOW UP APPOINTMENT FOR MORE REFILLS (Patient taking differently: Take 40 mg by mouth every evening.) 90 tablet 0   sertraline (ZOLOFT) 50 MG tablet Take 50 mg by mouth at bedtime.     No current facility-administered medications on file prior to visit.   Allergies  Allergen Reactions   Veltassa [Patiromer] Diarrhea   Darvon Other (See Comments)    indigestion   Mexiletine Hcl     N/V, tremors, difficulty walking, blurred vision   Promethazine Hcl Other (See Comments)  hyperactivity   Ranexa [Ranolazine Er] Diarrhea   Spironolactone Diarrhea and Nausea And Vomiting   Plavix [Clopidogrel Bisulfate] Rash   Family History  Problem Relation Age of Onset   Diabetes Mother    Heart disease Mother    Hyperlipidemia Mother    Hypertension Mother    Heart disease Father    Heart attack Father    Hypertension Father    Breast cancer Sister    Lung cancer Sister    Irritable bowel syndrome Sister    Early death Brother 21   Colon cancer Maternal Aunt    Seizures Granddaughter    Esophageal cancer Neg Hx    Colon polyps Neg Hx    PE: BP 120/68   Pulse 60   Ht 5\' 3"  (1.6 m)   Wt 187 lb 9.6 oz (85.1 kg)   SpO2 94%   BMI 33.23 kg/m     Wt Readings from Last 3 Encounters:  11/19/22 187 lb 9.6 oz (85.1 kg)  10/13/22 190 lb 4.8 oz (86.3 kg)  10/03/22 191 lb 12.8 oz (87 kg)   Constitutional: overweight, in NAD, walks with a walker Eyes: EOMI, no exophthalmos ENT: no thyromegaly, no cervical lymphadenopathy Cardiovascular: RRR, No MRG, + B pitting edema Respiratory: CTA B Musculoskeletal: no deformities Skin:  no rashes Neurological: + Very mild tremor with outstretched hands Diabetic Foot Exam - Simple   Simple Foot Form Diabetic Foot exam was performed with the following findings: Yes 11/19/2022 10:44 AM  Visual Inspection No deformities, no ulcerations, no other skin breakdown bilaterally: Yes Sensation Testing Intact to touch and monofilament testing bilaterally: Yes Pulse Check Posterior Tibialis and Dorsalis pulse intact bilaterally: Yes Comments    ASSESSMENT: 1. DM2, insulin-dependent, controlled, with complications - CAD - s/p stent - ICM with CHF - s/p ICD placement - A fib - s/p pacemaker - CKD - PN - on neurontin - gastroparesis per [facility]ES 06/18/2012 >> 60 minutes: 92%, 120 minutes: 82%  2. HL  3.  History of hypothyroidism  PLAN:  1. Patient with longstanding, uncontrolled, type 2 diabetes, on SGLT2 inhibitor, weekly GLP-1 receptor agonist and basal-bolus insulin with worsening control.  At last visit, HbA1c was improved, 6.2% but she had another HbA1c obtained last month this was much higher, at 9.2%. -At last visit, we did discuss about increasing the Trulicity dose after she ran out of the lower dose and also increasing the doses of NovoLog.  I advised her to switch from Levemir to [place] as Levemir was being phased out of the market.  She wanted to avoid Lantus, as discussed weight gain in the past. -At today's visit, reviewing her blood sugars at home after her admission, they have improved.  She did have 2 instances when the sugars went up to 300s and even 1x 400 after orange juice.  I  strongly advised her to stop juice.  Otherwise, due to the improvement in blood sugars and occasional lows in the 40s and 50s at night, we discussed about trying to decrease the doses of NovoLog with dinner to avoid low blood sugars overnight, I advised her to continue the long-acting insulin dose, and to try to restart Jardiance.  We gave her samples at today's visit and also gave her the patient assistance paperwork for this. - I suggested to: Patient Instructions  Please continue: - Trulicity 3 mg weekly in a.m. - Evaristo Bury 12 units at bedtime  Change: - Novolog  7-9 units before b'fast and lunch 4-5  units before dinner  Try to restart: - Jardiance 10 mg before breakfast  NO JUICE, REGULAR SODAS, SWEET TEA!  Please return in 3 months with your sugar log.   - advised to check sugars at different times of the day - 4x a day, rotating check times - advised for yearly eye exams >> she is not UTD - return to clinic in 4 months  2. HL -Reviewed latest lipid panel from 11/2021: LDL above our target of less than 55 due to cardiovascular disease, otherwise fractions at goal: Lab Results  Component Value Date   CHOL 125 12/18/2021   HDL 50 12/18/2021   LDLCALC 66 12/18/2021   TRIG 44 12/18/2021   CHOLHDL 2.5 12/18/2021  -She continues Crestor 40 mg daily without side effects  3.  History of hypothyroidism -Possibly related to amiodarone -She will previously on the low-dose levothyroxine but we were able to stop -Subsequent TFTs were normal including at last check: Lab Results  Component Value Date   TSH 2.833 10/02/2022  -No intervention needed for now  Carlus Pavlov, MD PhD [facility] Endocrinology

## 2022-11-26 ENCOUNTER — Other Ambulatory Visit: Payer: Self-pay | Admitting: Cardiology

## 2022-12-07 ENCOUNTER — Encounter: Payer: Self-pay | Admitting: Student

## 2022-12-07 ENCOUNTER — Encounter (HOSPITAL_COMMUNITY)
Admission: EM | Disposition: A | Discharge status: Home Health | Payer: Self-pay | Source: Home / Self Care | Attending: Cardiology

## 2022-12-07 ENCOUNTER — Inpatient Hospital Stay (HOSPITAL_COMMUNITY): Payer: Medicare HMO | Admitting: Anesthesiology

## 2022-12-07 ENCOUNTER — Emergency Department (HOSPITAL_COMMUNITY): Payer: Medicare HMO

## 2022-12-07 ENCOUNTER — Other Ambulatory Visit: Payer: Self-pay

## 2022-12-07 ENCOUNTER — Inpatient Hospital Stay (HOSPITAL_COMMUNITY)
Admission: EM | Admit: 2022-12-07 | Discharge: 2022-12-09 | DRG: 309 | Disposition: A | Discharge status: Home Health | Payer: Medicare HMO | Attending: Cardiology | Admitting: Cardiology

## 2022-12-07 ENCOUNTER — Ambulatory Visit: Payer: Medicare HMO | Attending: Physician Assistant | Admitting: Student

## 2022-12-07 VITALS — BP 102/58 | HR 105 | Ht 63.0 in | Wt 186.2 lb

## 2022-12-07 DIAGNOSIS — Z833 Family history of diabetes mellitus: Secondary | ICD-10-CM | POA: Diagnosis not present

## 2022-12-07 DIAGNOSIS — Z955 Presence of coronary angioplasty implant and graft: Secondary | ICD-10-CM | POA: Diagnosis not present

## 2022-12-07 DIAGNOSIS — R9431 Abnormal electrocardiogram [ECG] [EKG]: Secondary | ICD-10-CM

## 2022-12-07 DIAGNOSIS — Z7901 Long term (current) use of anticoagulants: Secondary | ICD-10-CM

## 2022-12-07 DIAGNOSIS — Z7984 Long term (current) use of oral hypoglycemic drugs: Secondary | ICD-10-CM

## 2022-12-07 DIAGNOSIS — N1832 Chronic kidney disease, stage 3b: Secondary | ICD-10-CM

## 2022-12-07 DIAGNOSIS — Z7982 Long term (current) use of aspirin: Secondary | ICD-10-CM

## 2022-12-07 DIAGNOSIS — I255 Ischemic cardiomyopathy: Secondary | ICD-10-CM | POA: Diagnosis present

## 2022-12-07 DIAGNOSIS — Z7985 Long-term (current) use of injectable non-insulin antidiabetic drugs: Secondary | ICD-10-CM

## 2022-12-07 DIAGNOSIS — E1143 Type 2 diabetes mellitus with diabetic autonomic (poly)neuropathy: Secondary | ICD-10-CM | POA: Diagnosis present

## 2022-12-07 DIAGNOSIS — K3184 Gastroparesis: Secondary | ICD-10-CM | POA: Diagnosis present

## 2022-12-07 DIAGNOSIS — Z8249 Family history of ischemic heart disease and other diseases of the circulatory system: Secondary | ICD-10-CM | POA: Diagnosis not present

## 2022-12-07 DIAGNOSIS — I2510 Atherosclerotic heart disease of native coronary artery without angina pectoris: Secondary | ICD-10-CM | POA: Diagnosis present

## 2022-12-07 DIAGNOSIS — I480 Paroxysmal atrial fibrillation: Secondary | ICD-10-CM

## 2022-12-07 DIAGNOSIS — E039 Hypothyroidism, unspecified: Secondary | ICD-10-CM | POA: Diagnosis present

## 2022-12-07 DIAGNOSIS — Z79899 Other long term (current) drug therapy: Secondary | ICD-10-CM

## 2022-12-07 DIAGNOSIS — I080 Rheumatic disorders of both mitral and aortic valves: Secondary | ICD-10-CM | POA: Diagnosis present

## 2022-12-07 DIAGNOSIS — Z8673 Personal history of transient ischemic attack (TIA), and cerebral infarction without residual deficits: Secondary | ICD-10-CM

## 2022-12-07 DIAGNOSIS — I130 Hypertensive heart and chronic kidney disease with heart failure and stage 1 through stage 4 chronic kidney disease, or unspecified chronic kidney disease: Secondary | ICD-10-CM

## 2022-12-07 DIAGNOSIS — I5022 Chronic systolic (congestive) heart failure: Secondary | ICD-10-CM

## 2022-12-07 DIAGNOSIS — E785 Hyperlipidemia, unspecified: Secondary | ICD-10-CM | POA: Diagnosis present

## 2022-12-07 DIAGNOSIS — I472 Ventricular tachycardia, unspecified: Principal | ICD-10-CM | POA: Diagnosis present

## 2022-12-07 DIAGNOSIS — E1122 Type 2 diabetes mellitus with diabetic chronic kidney disease: Secondary | ICD-10-CM | POA: Diagnosis present

## 2022-12-07 DIAGNOSIS — Z794 Long term (current) use of insulin: Secondary | ICD-10-CM

## 2022-12-07 DIAGNOSIS — Z87891 Personal history of nicotine dependence: Secondary | ICD-10-CM | POA: Diagnosis not present

## 2022-12-07 DIAGNOSIS — Z95810 Presence of automatic (implantable) cardiac defibrillator: Secondary | ICD-10-CM | POA: Diagnosis not present

## 2022-12-07 DIAGNOSIS — N179 Acute kidney failure, unspecified: Secondary | ICD-10-CM | POA: Diagnosis not present

## 2022-12-07 DIAGNOSIS — G4730 Sleep apnea, unspecified: Secondary | ICD-10-CM | POA: Diagnosis present

## 2022-12-07 DIAGNOSIS — R0600 Dyspnea, unspecified: Principal | ICD-10-CM

## 2022-12-07 DIAGNOSIS — Z888 Allergy status to other drugs, medicaments and biological substances status: Secondary | ICD-10-CM

## 2022-12-07 LAB — CBC
HCT: 39.2 % (ref 36.0–46.0)
Hemoglobin: 11.9 g/dL — ABNORMAL LOW (ref 12.0–15.0)
MCH: 28.3 pg (ref 26.0–34.0)
MCHC: 30.4 g/dL (ref 30.0–36.0)
MCV: 93.1 fL (ref 80.0–100.0)
Platelets: 227 10*3/uL (ref 150–400)
RBC: 4.21 MIL/uL (ref 3.87–5.11)
RDW: 15.9 % — ABNORMAL HIGH (ref 11.5–15.5)
WBC: 6.1 10*3/uL (ref 4.0–10.5)
nRBC: 0 % (ref 0.0–0.2)

## 2022-12-07 LAB — TROPONIN I (HIGH SENSITIVITY)
Troponin I (High Sensitivity): 43 ng/L — ABNORMAL HIGH (ref <18)
Troponin I (High Sensitivity): 42 ng/L — ABNORMAL HIGH (ref <18)

## 2022-12-07 LAB — POC OCCULT BLOOD, ED: Fecal Occult Bld: NEGATIVE

## 2022-12-07 LAB — BASIC METABOLIC PANEL
Anion gap: 11 (ref 5–15)
BUN: 31 mg/dL — ABNORMAL HIGH (ref 8–23)
CO2: 28 mmol/L (ref 22–32)
Calcium: 8.5 mg/dL — ABNORMAL LOW (ref 8.9–10.3)
Chloride: 103 mmol/L (ref 98–111)
Creatinine, Ser: 2.25 mg/dL — ABNORMAL HIGH (ref 0.44–1.00)
GFR, Estimated: 22 mL/min — ABNORMAL LOW (ref >60)
Glucose, Bld: 190 mg/dL — ABNORMAL HIGH (ref 70–99)
Potassium: 4 mmol/L (ref 3.5–5.1)
Sodium: 142 mmol/L (ref 135–145)

## 2022-12-07 LAB — TYPE AND SCREEN
ABO/RH(D): A POS
Antibody Screen: NEGATIVE

## 2022-12-07 LAB — PROTIME-INR
INR: 2 — ABNORMAL HIGH (ref 0.8–1.2)
Prothrombin Time: 22.6 s — ABNORMAL HIGH (ref 11.4–15.2)

## 2022-12-07 LAB — GLUCOSE, CAPILLARY
Glucose-Capillary: 113 mg/dL — ABNORMAL HIGH (ref 70–99)
Glucose-Capillary: 222 mg/dL — ABNORMAL HIGH (ref 70–99)

## 2022-12-07 LAB — BRAIN NATRIURETIC PEPTIDE: B Natriuretic Peptide: 257.9 pg/mL — ABNORMAL HIGH (ref 0.0–100.0)

## 2022-12-07 SURGERY — INVASIVE LAB ABORTED CASE
Anesthesia: Monitor Anesthesia Care

## 2022-12-07 MED ORDER — SERTRALINE HCL 50 MG PO TABS
50.0000 mg | ORAL_TABLET | Freq: Every day | ORAL | Status: DC
  Administered 2022-12-07 – 2022-12-08 (×2): 50 mg via ORAL
  Filled 2022-12-07 (×2): qty 1

## 2022-12-07 MED ORDER — MEXILETINE HCL 150 MG PO CAPS
150.0000 mg | ORAL_CAPSULE | Freq: Two times a day (BID) | ORAL | Status: DC
  Administered 2022-12-08 – 2022-12-09 (×3): 150 mg via ORAL
  Filled 2022-12-07 (×5): qty 1

## 2022-12-07 MED ORDER — SODIUM CHLORIDE 0.9 % IV SOLN
INTRAVENOUS | Status: DC | PRN
Start: 2022-12-07 — End: 2022-12-07

## 2022-12-07 MED ORDER — SODIUM CHLORIDE 0.9 % IV BOLUS: 500.0000 mL | Freq: Once | INTRAVENOUS | Status: DC

## 2022-12-07 MED ORDER — APIXABAN 5 MG PO TABS
5.0000 mg | ORAL_TABLET | Freq: Two times a day (BID) | ORAL | Status: DC
  Administered 2022-12-07 – 2022-12-09 (×4): 5 mg via ORAL
  Filled 2022-12-07 (×4): qty 1

## 2022-12-07 MED ORDER — INSULIN ASPART 100 UNIT/ML IJ SOLN
0.0000 [IU] | Freq: Every day | INTRAMUSCULAR | Status: DC
  Administered 2022-12-07: 2 [IU] via SUBCUTANEOUS

## 2022-12-07 MED ORDER — HEPARIN (PORCINE) IN NACL 1000-0.9 UT/500ML-% IV SOLN
INTRAVENOUS | Status: DC | PRN
  Administered 2022-12-07 (×3): 500 mL

## 2022-12-07 MED ORDER — MIDAZOLAM HCL 2 MG/2ML IJ SOLN
INTRAMUSCULAR | Status: DC | PRN
  Administered 2022-12-07: .5 mg via INTRAVENOUS

## 2022-12-07 MED ORDER — FENTANYL CITRATE (PF) 100 MCG/2ML IJ SOLN
INTRAMUSCULAR | Status: AC
  Filled 2022-12-07: qty 2

## 2022-12-07 MED ORDER — ASPIRIN 81 MG PO TBEC
81.0000 mg | DELAYED_RELEASE_TABLET | Freq: Every day | ORAL | Status: DC
  Administered 2022-12-08 – 2022-12-09 (×2): 81 mg via ORAL
  Filled 2022-12-07 (×2): qty 1

## 2022-12-07 MED ORDER — ROSUVASTATIN CALCIUM 20 MG PO TABS
40.0000 mg | ORAL_TABLET | Freq: Every evening | ORAL | Status: DC
  Administered 2022-12-07 – 2022-12-08 (×2): 40 mg via ORAL
  Filled 2022-12-07 (×3): qty 2

## 2022-12-07 MED ORDER — HEPARIN SODIUM (PORCINE) 1000 UNIT/ML IJ SOLN
INTRAMUSCULAR | Status: AC
  Filled 2022-12-07: qty 10

## 2022-12-07 MED ORDER — GABAPENTIN 100 MG PO CAPS
200.0000 mg | ORAL_CAPSULE | Freq: Two times a day (BID) | ORAL | Status: DC
  Administered 2022-12-07 – 2022-12-09 (×4): 200 mg via ORAL
  Filled 2022-12-07 (×4): qty 2

## 2022-12-07 MED ORDER — ONDANSETRON HCL 4 MG/2ML IJ SOLN
INTRAMUSCULAR | Status: DC | PRN
Start: 2022-12-07 — End: 2022-12-07
  Administered 2022-12-07: 4 mg via INTRAVENOUS

## 2022-12-07 MED ORDER — MAGNESIUM OXIDE -MG SUPPLEMENT 400 (240 MG) MG PO TABS
400.0000 mg | ORAL_TABLET | Freq: Two times a day (BID) | ORAL | Status: DC
  Administered 2022-12-07 – 2022-12-09 (×4): 400 mg via ORAL
  Filled 2022-12-07 (×4): qty 1

## 2022-12-07 MED ORDER — BUPIVACAINE HCL (PF) 0.25 % IJ SOLN
INTRAMUSCULAR | Status: AC
  Filled 2022-12-07: qty 30

## 2022-12-07 MED ORDER — ACETAMINOPHEN 325 MG PO TABS: 650.0000 mg | ORAL_TABLET | ORAL | Status: DC | PRN

## 2022-12-07 MED ORDER — DICYCLOMINE HCL 10 MG PO CAPS
20.0000 mg | ORAL_CAPSULE | Freq: Two times a day (BID) | ORAL | Status: DC
  Administered 2022-12-07 – 2022-12-09 (×4): 20 mg via ORAL
  Filled 2022-12-07 (×4): qty 2

## 2022-12-07 MED ORDER — BUSPIRONE HCL 5 MG PO TABS
5.0000 mg | ORAL_TABLET | Freq: Two times a day (BID) | ORAL | Status: DC
  Administered 2022-12-07 – 2022-12-09 (×4): 5 mg via ORAL
  Filled 2022-12-07 (×4): qty 1

## 2022-12-07 MED ORDER — MIDAZOLAM HCL 2 MG/2ML IJ SOLN
INTRAMUSCULAR | Status: AC
  Filled 2022-12-07: qty 2

## 2022-12-07 MED ORDER — AMIODARONE HCL 200 MG PO TABS
200.0000 mg | ORAL_TABLET | Freq: Every day | ORAL | Status: DC
  Administered 2022-12-07 – 2022-12-09 (×3): 200 mg via ORAL
  Filled 2022-12-07 (×4): qty 1

## 2022-12-07 MED ORDER — INSULIN ASPART 100 UNIT/ML IJ SOLN
0.0000 [IU] | Freq: Three times a day (TID) | INTRAMUSCULAR | Status: DC
  Administered 2022-12-08 (×2): 3 [IU] via SUBCUTANEOUS
  Administered 2022-12-08: 2 [IU] via SUBCUTANEOUS
  Administered 2022-12-09: 5 [IU] via SUBCUTANEOUS
  Administered 2022-12-09: 3 [IU] via SUBCUTANEOUS

## 2022-12-07 MED ORDER — LEVETIRACETAM 250 MG PO TABS
250.0000 mg | ORAL_TABLET | Freq: Two times a day (BID) | ORAL | Status: DC
  Administered 2022-12-07 – 2022-12-09 (×4): 250 mg via ORAL
  Filled 2022-12-07 (×5): qty 1

## 2022-12-07 MED ORDER — NITROGLYCERIN 0.4 MG SL SUBL: 0.4000 mg | SUBLINGUAL_TABLET | SUBLINGUAL | Status: DC | PRN

## 2022-12-07 MED ORDER — FENTANYL CITRATE (PF) 100 MCG/2ML IJ SOLN
INTRAMUSCULAR | Status: DC | PRN
  Administered 2022-12-07: 25 ug via INTRAVENOUS

## 2022-12-07 MED ORDER — CARVEDILOL 25 MG PO TABS
25.0000 mg | ORAL_TABLET | Freq: Two times a day (BID) | ORAL | Status: DC
  Administered 2022-12-07 – 2022-12-09 (×4): 25 mg via ORAL
  Filled 2022-12-07 (×4): qty 1

## 2022-12-07 SURGICAL SUPPLY — 14 items
CATH 8FR REPROCESSED SOUNDSTAR (CATHETERS) ×2 IMPLANT
CATH 8FR SOUNDSTAR REPROCESSED (CATHETERS) IMPLANT
CATH DECANAV D CURVE (CATHETERS) ×1 IMPLANT
CATH JOSEPH QUAD ALLRED 6F REP (CATHETERS) ×1 IMPLANT
CATH PIGTAIL STEERABLE D1 8.7 (WIRE) ×1 IMPLANT
CLOSURE PERCLOSE PROSTYLE (VASCULAR PRODUCTS) ×2 IMPLANT
DEVICE CLOSURE MYNXGRIP 6/7F (Vascular Products) IMPLANT
PACK EP LATEX FREE (CUSTOM PROCEDURE TRAY) ×2
PACK EP LF (CUSTOM PROCEDURE TRAY) ×2 IMPLANT
PAD DEFIB RADIO PHYSIO CONN (PAD) ×2 IMPLANT
PATCH CARTO3 (PAD) ×1 IMPLANT
SHEATH PINNACLE 8F 10CM (SHEATH) ×3 IMPLANT
SHEATH PINNACLE 9F 10CM (SHEATH) ×1 IMPLANT
TUBING SMART ABLATE COOLFLOW (TUBING) ×1 IMPLANT

## 2022-12-07 NOTE — Anesthesia Postprocedure Evaluation (Signed)
Anesthesia Post Note  Patient: [name]  Procedure(s) Performed: Floyce Stakes ABLATION     Patient location during evaluation: PACU Anesthesia Type: MAC Level of consciousness: awake and alert Pain management: pain level controlled Vital Signs Assessment: post-procedure vital signs reviewed and stable Respiratory status: spontaneous breathing, nonlabored ventilation, respiratory function stable and patient connected to nasal cannula oxygen Cardiovascular status: stable and blood pressure returned to baseline Postop Assessment: no apparent nausea or vomiting Anesthetic complications: no  No notable events documented.  Last Vitals:  Vitals:   12/07/22 1326 12/07/22 1527  BP: 110/79 (!) 108/56  Pulse: (!) 107 (!) 53  Resp: (!) 24 15  Temp:    SpO2: 99% 100%    Last Pain:  Vitals:   12/07/22 1205  TempSrc:   PainSc: 0-No pain                 Trana Ressler S

## 2022-12-07 NOTE — H&P (Addendum)
Cardiology Admission History and Physical   Patient ID: [name] MRN: [id number]; DOB: Aug 20, 1945   Admission date: 12/07/2022  PCP:  Oneita Hurt No   [facility] Providers Cardiologist:  [name], MD  Electrophysiologist:  Will [name], MD  {   Chief Complaint:  VT  Patient Profile:   [name] is a 77 y.o. female with CAD (s/p LAD and RCA PCI.  Last LHC in 11/12 with patent proximal LAD stent, ostial 70% D1 (jailed by stent), mild LAD stent patent, patent RCA stents), ICM, chronic CHF, VT, ICD, AFib, stroke, DM, HTN, HLD, CKD (III), gastroparesis, Hypothyroidism  who is being seen 12/07/2022 for the evaluation of VT.  Device information SJM dual chamber ICD, implanted 2003,  2007, gen changes 2013, Jan 2022 2003 had syncope >> EP study with inducible PMVT + h/o PMVT 2012 with ICD shocks 04/24/21 appropriate tx for VT w/HV tx March 2024 (MMVT) July 2024 (MMVT) July 2024 (VF)   At the time of her gen change Jan 2022,  SJM Riata ST 7040 lead was fluoroscopically and electrically normal today, opted to use this lead rather than replacing it after a long discussion with the patient prior to the procedure.     AAD Hx 2007 amiodarone started quickly stopped 2/2 nausea Remotely as well tried on Sotalol and Multaq unclear when/why they were stopped Intolerant of Ranexa and mexiletine (GI especially) 2012 restarted on amiodarone     History of Present Illness:   [name] this year has been struggling with her VT, this is the 4th admission. March 2024 MMVT July 2024 MMVT July VF (treated via her device successfully, pt was unaware) >> cathed this admission with no obstructive CAD  Today she was at our office for an appt, reported not feeling well, tired, weak, a little SOB for a few days, interrogation of her device noted she was in a slow VT. Elected not to try and pace terminate in the office (hemodynamically stable) and referred tot he  ER    Most recent BP 112/78 LABS: K+ 4.0 BUN/Creat 31/2.25 (baseline looks probably about 1.5 - 1.8) WBC 6/1 H/H 11.9/39 Plts 227     Past Medical History:  Diagnosis Date   Allergy    Arthritis    Atrial fibrillation (HCC)    controlled with amiodarone, on coumadin   Chronic renal insufficiency    Chronic systolic heart failure (HCC)    Coronary artery disease 01/31/2011   Diabetes mellitus    type 1   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    History of chicken pox    Hyperlipidemia    Hypertension    Ischemic cardiomyopathy    Lumbar spondylosis 01/11/2012   Sleep apnea    Stroke [facility]) 2010   eye doctor said she had TIA   Ventricular tachycardia (HCC)    Polymorphic    Past Surgical History:  Procedure Laterality Date   ABDOMINAL HYSTERECTOMY  1995   CARDIAC DEFIBRILLATOR PLACEMENT     CHOLECYSTECTOMY  1985   COLONOSCOPY WITH PROPOFOL N/A 02/24/2018   Procedure: COLONOSCOPY WITH PROPOFOL;  Surgeon: Rachael Fee, MD;  Location: [facility] ENDOSCOPY;  Service: Endoscopy;  Laterality: N/A;   ICD  2003/2007   implanted by Dr Alanda Amass, most recent generator change 2/13 by Dr Johney Frame, Analyze ST study patient   ICD GENERATOR CHANGEOUT N/A 04/11/2020   Procedure: ICD GENERATOR CHANGEOUT;  Surgeon: Hillis Range, MD;  Location: [facility] INVASIVE CV LAB;  Service: Cardiovascular;  Laterality: N/A;   IMPLANTABLE CARDIOVERTER DEFIBRILLATOR (ICD) GENERATOR CHANGE N/A 05/05/2011   Procedure: ICD GENERATOR CHANGE;  Surgeon: Hillis Range, MD;  Location: [facility] CATH LAB;  Service: Cardiovascular;  Laterality: N/A;   LEFT HEART CATH AND CORONARY ANGIOGRAPHY N/A 10/14/2022   Procedure: LEFT HEART CATH AND CORONARY ANGIOGRAPHY;  Surgeon: Kathleene Hazel, MD;  Location: [facility] INVASIVE CV LAB;  Service: Cardiovascular;  Laterality: N/A;   LEFT HEART CATHETERIZATION WITH CORONARY ANGIOGRAM N/A 01/30/2011   Procedure: LEFT HEART CATHETERIZATION WITH CORONARY ANGIOGRAM;  Surgeon: Laurey Morale, MD;  Location: [facility] CATH LAB;  Service: Cardiovascular;  Laterality: N/A;   POLYPECTOMY  02/24/2018   Procedure: POLYPECTOMY;  Surgeon: Rachael Fee, MD;  Location: [facility] ENDOSCOPY;  Service: Endoscopy;;   TUBALIGATION  1980     Medications Prior to Admission: Prior to Admission medications   Medication Sig Start Date End Date Taking? Authorizing Provider  acetaminophen (TYLENOL) 325 MG tablet Take 650 mg by mouth every 6 (six) hours as needed for moderate pain.    [provider]  amiodarone (PACERONE) 200 MG tablet Take 1 tablet (200 mg total) by mouth daily. 11/15/22   Jeanella Craze, NP  amiodarone (PACERONE) 400 MG tablet Take one 400 mg tablet once in am and once in evening for 2 weeks (through 10/30/22), then decrease to one 400 mg daily for 2 weeks (through 11/14/22), then start 200mg  daily (separate prescription) 10/16/22 10/31/22  Jeanella Craze, NP  aspirin EC 81 MG tablet Take 1 tablet (81 mg total) by mouth daily. Swallow whole. 10/17/22   Jeanella Craze, NP  Blood Glucose Monitoring Suppl (ACCU-CHEK NANO SMARTVIEW) W/DEVICE KIT Use to test blood sugar 4 times daily as instructed. Dx code: E11.59 03/01/14   Carlus Pavlov, MD  busPIRone (BUSPAR) 5 MG tablet Take 1 tablet (5 mg total) by mouth 2 (two) times daily. 05/20/21   Milford, Anderson Malta, FNP  carvedilol (COREG) 25 MG tablet Take 1 tablet (25 mg total) by mouth 2 (two) times daily with a meal. 10/16/22   Ollis, Luetta Nutting, NP  Cholecalciferol (VITAMIN D3) 2000 UNITS TABS Take 2,000 Units by mouth every morning.    [provider]  Continuous Glucose Sensor (FREESTYLE LIBRE 3 SENSOR) MISC 1 each by Does not apply route every 14 (fourteen) days. 11/19/22   Carlus Pavlov, MD  dapagliflozin propanediol (FARXIGA) 10 MG TABS tablet Take 1 tablet (10 mg total) by mouth daily before breakfast. 11/06/22   Carlus Pavlov, MD  dicyclomine (BENTYL) 10 MG capsule TAKE 1 CAPSULE (10 MG TOTAL) BY MOUTH 4 (FOUR) TIMES  DAILY BEFORE MEALS AND AT BEDTIME. Patient taking differently: Take 20 mg by mouth 2 (two) times daily. 02/09/20   Corwin Levins, MD  Dulaglutide (TRULICITY) 3 MG/0.5ML SOPN Inject 3 mg into the skin once a week. Patient taking differently: Inject 3 mg into the skin once a week. Mondays 03/13/22   Carlus Pavlov, MD  ELIQUIS 5 MG TABS tablet TAKE 1 TABLET BY MOUTH TWICE A DAY Patient taking differently: Take 5 mg by mouth 2 (two) times daily. 12/22/21   Laurey Morale, MD  empagliflozin (JARDIANCE) 10 MG TABS tablet Take 1 tablet (10 mg total) by mouth every morning. 11/11/21   Carlus Pavlov, MD  fluticasone (FLONASE) 50 MCG/ACT nasal spray Place 1 spray into both nostrils daily as needed for allergies or rhinitis. 03/13/19 07/15/23  [provider]  furosemide (LASIX) 20 MG tablet TAKE  1 TABLET BY MOUTH EVERY DAY Patient taking differently: Take 20 mg by mouth 2 (two) times daily. 08/21/22   Milford, Anderson Malta, FNP  gabapentin (NEURONTIN) 100 MG capsule Take 2 capsules (200 mg total) by mouth 2 (two) times daily. 06/25/22   Carlus Pavlov, MD  insulin aspart (NOVOLOG FLEXPEN) 100 UNIT/ML FlexPen Inject up to 15 units daily under skin as advised Patient taking differently: Inject 0-15 Units into the skin 3 (three) times daily. Sliding scale 06/24/22   Carlus Pavlov, MD  insulin detemir (LEVEMIR) 100 UNIT/ML injection Inject 0.14 mLs (14 Units total) into the skin at bedtime. 10/16/22   Jeanella Craze, NP  levETIRAcetam (KEPPRA) 250 MG tablet TAKE 1 TABLET BY MOUTH TWICE A DAY 10/14/22   Windell Norfolk, MD  loperamide (IMODIUM) 2 MG capsule Take 1 capsule (2 mg total) by mouth as needed for diarrhea or loose stools. Patient taking differently: Take 2 mg by mouth daily as needed for diarrhea or loose stools. 10/06/22   Noralee Stain, DO  losartan (COZAAR) 25 MG tablet Take 1 tablet (25 mg total) by mouth daily. Patient taking differently: Take 25 mg by mouth every evening. 06/15/22  06/15/23  Graciella Freer, PA-C  magnesium oxide (MAG-OX) 400 (240 Mg) MG tablet TAKE 1 TABLET BY MOUTH TWICE A DAY Patient taking differently: Take 400 mg by mouth 2 (two) times daily. 12/19/21   Corwin Levins, MD  Multiple Vitamins-Minerals (EYE VITAMINS PO) Take 1 tablet by mouth 2 (two) times daily.    [provider]  nitroGLYCERIN (NITROSTAT) 0.4 MG SL tablet Place 1 tablet (0.4 mg total) under the tongue every 5 (five) minutes as needed for chest pain. 05/20/21   Milford, Anderson Malta, FNP  omeprazole (PRILOSEC OTC) 20 MG tablet Take 20 mg by mouth every morning.    [provider]  ranolazine (RANEXA) 500 MG 12 hr tablet TAKE 1 TABLET BY MOUTH TWICE A DAY 11/26/22   Camnitz, Andree Coss, MD  rosuvastatin (CRESTOR) 40 MG tablet Take 1 tablet (40 mg total) by mouth daily. NEEDS FOLLOW UP APPOINTMENT FOR MORE REFILLS Patient taking differently: Take 40 mg by mouth every evening. 09/17/22   Laurey Morale, MD  sertraline (ZOLOFT) 50 MG tablet Take 50 mg by mouth at bedtime. 09/17/22   [provider]     Allergies:    Allergies  Allergen Reactions   Veltassa [Patiromer] Diarrhea   Darvon Other (See Comments)    indigestion   Mexiletine Hcl     N/V, tremors, difficulty walking, blurred vision   Promethazine Hcl Other (See Comments)    hyperactivity   Ranexa [Ranolazine Er] Diarrhea   Spironolactone Diarrhea and Nausea And Vomiting   Plavix [Clopidogrel Bisulfate] Rash    Social History:   Social History   Socioeconomic History   Marital status: Divorced    Spouse name: Not on file   Number of children: 3   Years of education: 12   Highest education level: Not on file  Occupational History   Occupation: Retired  Tobacco Use   Smoking status: Former    Current packs/day: 0.00    Types: Cigarettes    Quit date: 08/16/1995    Years since quitting: 27.3   Smokeless tobacco: Never  Vaping Use   Vaping status: Never Used  Substance and Sexual  Activity   Alcohol use: No   Drug use: No   Sexual activity: Never    Birth control/protection: Post-menopausal  Other Topics  Concern   Not on file  Social History Narrative   Regular exercise-no Caffeine Use- sometimes   Lives with family   Social Determinants of Health   Financial Resource Strain: Patient Declined (07/17/2022)   Received from [facility], [facility]   Overall Financial Resource Strain (CARDIA)    Difficulty of Paying Living Expenses: Patient declined  Food Insecurity: No Food Insecurity (10/12/2022)   Hunger Vital Sign    Worried About Running Out of Food in the Last Year: Never true    Ran Out of Food in the Last Year: Never true  Transportation Needs: Unmet Transportation Needs (10/12/2022)   PRAPARE - Administrator, Civil Service (Medical): Yes    Lack of Transportation (Non-Medical): No  Physical Activity: Insufficiently Active (07/16/2022)   Received from [facility], [facility]   Exercise Vital Sign    Days of Exercise per Week: 1 day    Minutes of Exercise per Session: 10 min  Stress: Stress Concern Present (07/16/2022)   Received from [facility], [facility] of Occupational Health - Occupational Stress Questionnaire    Feeling of Stress : To some extent  Social Connections: Socially Integrated (07/16/2022)   Received from [facility], [facility]   Social Network    How would you rate your social network (family, work, friends)?: Good participation with social networks  Intimate Partner Violence: Not At Risk (10/12/2022)   Humiliation, Afraid, Rape, and Kick questionnaire    Fear of Current or Ex-Partner: No    Emotionally Abused: No    Physically Abused: No    Sexually Abused: No    Family History:   The patient's family history includes Breast cancer in her sister; Colon cancer in her maternal aunt; Diabetes in her mother; Early death (age of onset: 39) in her brother; Heart attack in her father;  Heart disease in her father and mother; Hyperlipidemia in her mother; Hypertension in her father and mother; Irritable bowel syndrome in her sister; Lung cancer in her sister; Seizures in her granddaughter. There is no history of Esophageal cancer or Colon polyps.    ROS:  Please see the history of present illness.  All other ROS reviewed and negative.     Physical Exam/Data:   Vitals:   12/07/22 1204 12/07/22 1208 12/07/22 1326  BP: (!) 87/64 93/61 110/79  Pulse: (!) 106  (!) 107  Resp: 18  (!) 24  Temp: 98.6 F (37 C)    TempSrc: Oral    SpO2: 97%  99%   No intake or output data in the 24 hours ending 12/07/22 1336    12/07/2022   11:05 AM 11/19/2022   10:30 AM 10/13/2022    6:00 AM  Last 3 Weights  Weight (lbs) 186 lb 3.2 oz 187 lb 9.6 oz 190 lb 4.8 oz  Weight (kg) 84.46 kg 85.095 kg 86.32 kg     There is no height or weight on file to calculate BMI.  General:  Well nourished, well developed, in no acute distres HEENT: normal Neck: no JVD Vascular: No carotid bruits; Distal pulses 2+ bilaterally   Cardiac:  RRR; slightly tachycardic, no murmurs, gallops or rubs Lungs: CTA b/l, no wheezing, rhonchi or rales  Abd: soft, nontender, no hepatomegaly  Ext: no edema Musculoskeletal:  No deformities, BUE and BLE strength normal and equal Skin: warm and dry  Neuro:  no focal abnormalities noted Psych:  Normal affect    EKG:  The ECG that was done today was personally reviewed with Drs Nelly Laurence and Parkerand demonstrates  VT 105bpm, narrow QRS , one paced/captured beat  Relevant CV Studies:   10/14/22: LHC   Prox RCA to Mid RCA lesion is 20% stenosed.   Dist RCA lesion is 40% stenosed.   Dist Cx lesion is 99% stenosed.   Mid LAD lesion is 20% stenosed.   Ost LAD to Mid LAD lesion is 20% stenosed.   Mid LAD to Dist LAD lesion is 20% stenosed.   2nd Diag lesion is 50% stenosed.   3rd Diag lesion is 40% stenosed.   Ost LM lesion is 20% stenosed.   Patent mid LAD stent  with minimal restenosis.  Severe stenosis in the very distal AV groove Circumflex artery leading into a very small obtuse marginal branch-too small for PCI Large dominant RCA with patent proximal and mid stents with minimal restenosis.  NO targets for PCI LVEDP 15 mmHg   Recommendations: Continue medical management of CAD. Continue ASA and resume Eliquis tomorrow. Given ACS, would consider adding Plavix but she has a Plavix allergy. I would not add Brilinta or Effient given advanced age and need for Eliquis.    06/15/22: TTE  1. Left ventricular ejection fraction, by estimation, is 25 to 30%. The  left ventricle has severely decreased function. The left ventricle  demonstrates global hypokinesis. Left ventricular diastolic parameters are  consistent with Grade I diastolic  dysfunction (impaired relaxation).   2. Right ventricular systolic function is normal. The right ventricular  size is normal. There is normal pulmonary artery systolic pressure.   3. Left atrial size was mildly dilated.   4. The mitral valve is normal in structure. Moderate mitral valve  regurgitation.   5. The aortic valve is grossly normal. Aortic valve regurgitation is  moderate to severe.   6. The inferior vena cava is dilated in size with >50% respiratory  variability, suggesting right atrial pressure of 8 mmHg.   Laboratory Data:  High Sensitivity Troponin:   Recent Labs  Lab 12/07/22 1210  TROPONINIHS 43*      Chemistry Recent Labs  Lab 12/07/22 1210  NA 142  K 4.0  CL 103  CO2 28  GLUCOSE 190*  BUN 31*  CREATININE 2.25*  CALCIUM 8.5*  GFRNONAA 22*  ANIONGAP 11    No results for input(s): "PROT", "ALBUMIN", "AST", "ALT", "ALKPHOS", "BILITOT" in the last 168 hours. Lipids No results for input(s): "CHOL", "TRIG", "HDL", "LABVLDL", "LDLCALC", "CHOLHDL" in the last 168 hours. Hematology Recent Labs  Lab 12/07/22 1210  WBC 6.1  RBC 4.21  HGB 11.9*  HCT 39.2  MCV 93.1  MCH 28.3  MCHC 30.4   RDW 15.9*  PLT 227   Thyroid No results for input(s): "TSH", "FREET4" in the last 168 hours. BNPNo results for input(s): "BNP", "PROBNP" in the last 168 hours.  DDimer No results for input(s): "DDIMER" in the last 168 hours.   Radiology/Studies:  No results found.   Assessment and Plan:   VT Recurrent given slow rate under her detection, unknown for how long though she has not felt well for a few days Today's VT looks similar to March, July was wider Intolerant of numerous meds Plan to bring her to the cath lab for EP, possible ablation  Planned for MAC  CAD No CP Cath in July as above Cx disease to small to target PCI, non-obstructive otherwise  ICM Chronic CHF Does not appear overtly volume OL  CorVue is above threshold Hold med for today, look to resume her HF meds tomorrow  She did not inject her Trilicity this AM (Mondays) She is not on either of the SGLT2i listed 2/2 cost  Paroxysmal AFib CHA2DS2Vasc is 9, on Eliquis Took her dose this AM Will discuss resumption post EP  AKI Follow, likely 2/2 VT  DM SSI   Risk Assessment/Risk Scores:    For questions or updates, please contact  [facility] Please consult [email or web address] for contact info under     Signed, Sheilah Pigeon, PA-C  12/07/2022 1:36 PM

## 2022-12-07 NOTE — Anesthesia Preprocedure Evaluation (Addendum)
Anesthesia Evaluation  Patient identified by MRN, date of birth, ID band Patient awake    Reviewed: Allergy & Precautions, H&P , NPO status , Patient's Chart, lab work & pertinent test results  Airway Mallampati: III  TM Distance: <3 FB Neck ROM: Full    Dental no notable dental hx.    Pulmonary sleep apnea , former smoker   Pulmonary exam normal breath sounds clear to auscultation       Cardiovascular hypertension, + Peripheral Vascular Disease  Normal cardiovascular exam+ dysrhythmias Atrial Fibrillation  Rhythm:Regular Rate:Normal  1. Left ventricular ejection fraction, by estimation, is 25 to 30%. The  left ventricle has severely decreased function. The left ventricle  demonstrates global hypokinesis. Left ventricular diastolic parameters are  consistent with Grade I diastolic  dysfunction (impaired relaxation).   2. Right ventricular systolic function is normal. The right ventricular  size is normal. There is normal pulmonary artery systolic pressure.   3. Left atrial size was mildly dilated.   4. The mitral valve is normal in structure. Moderate mitral valve  regurgitation.   5. The aortic valve is grossly normal. Aortic valve regurgitation is  moderate to severe.   6. The inferior vena cava is dilated in size with >50% respiratory  variability, suggesting right atrial pressure of 8 mmHg.     Neuro/Psych CVA  negative psych ROS   GI/Hepatic negative GI ROS, Neg liver ROS,,,  Endo/Other  diabetes, Type 2Hypothyroidism    Renal/GU negative Renal ROS  negative genitourinary   Musculoskeletal negative musculoskeletal ROS (+)    Abdominal   Peds negative pediatric ROS (+)  Hematology negative hematology ROS (+)   Anesthesia Other Findings   Reproductive/Obstetrics negative OB ROS                             Anesthesia Physical Anesthesia Plan  ASA: 4  Anesthesia Plan: MAC    Post-op Pain Management: Minimal or no pain anticipated   Induction: Intravenous  PONV Risk Score and Plan: 2 and Propofol infusion and Treatment may vary due to age or medical condition  Airway Management Planned: Simple Face Mask  Additional Equipment:   Intra-op Plan:   Post-operative Plan:   Informed Consent: I have reviewed the patients History and Physical, chart, labs and discussed the procedure including the risks, benefits and alternatives for the proposed anesthesia with the patient or authorized representative who has indicated his/her understanding and acceptance.     Dental advisory given  Plan Discussed with: CRNA and Surgeon  Anesthesia Plan Comments:        Anesthesia Quick Evaluation

## 2022-12-07 NOTE — ED Notes (Signed)
ED TO INPATIENT HANDOFF REPORT  ED Nurse Name and Phone #: Einar Grad [phone]  S Name/Age/Gender [name] 77 y.o. female Room/Bed: 032C/032C  Code Status   Code Status: Full Code  Home/SNF/Other Home Patient oriented to: self, place, time, and situation Is this baseline? Yes   Triage Complete: Triage complete  Chief Complaint VT (ventricular tachycardia) (HCC) [I47.20]  Triage Note Pt to ED via POV from Dr. Isidore Moos. Pt states she went to Dr. Today for check up and was sent to ED by Dr. D/t elevated HR. Pt reports HR was 105 at Dr. Isidore Moos. Pt denies CP and denies palpations. Pt does endorse SOB while ambulating. Pt has internal defibrillator. Pt has hx of a fib.    Allergies Allergies  Allergen Reactions   Veltassa [Patiromer] Diarrhea   Darvon Other (See Comments)    indigestion   Mexiletine Hcl     N/V, tremors, difficulty walking, blurred vision   Promethazine Hcl Other (See Comments)    hyperactivity   Ranexa [Ranolazine Er] Diarrhea   Spironolactone Diarrhea and Nausea And Vomiting   Plavix [Clopidogrel Bisulfate] Rash    Level of Care/Admitting Diagnosis ED Disposition     ED Disposition  Admit   Condition  --   Comment  Hospital Area: [facility] [[id number]]  Level of Care: Telemetry Cardiac [[id number]]  May admit patient to [facility] or [facility] if equivalent level of care is available:: No  Covid Evaluation: Asymptomatic - no recent exposure (last 10 days) testing not required  Diagnosis: VT (ventricular tachycardia) [facility]) [[id number]]  Admitting Physician: Maurice Small [[id number]]  Attending Physician: Regan Lemming [phone]  Certification:: I certify this patient will need inpatient services for at least 2 midnights  Expected Medical Readiness: 12/11/2022          B Medical/Surgery History Past Medical History:  Diagnosis Date   Allergy    Arthritis    Atrial fibrillation (HCC)    controlled with amiodarone, on  coumadin   Chronic renal insufficiency    Chronic systolic heart failure (HCC)    Coronary artery disease 01/31/2011   Diabetes mellitus    type 1   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    History of chicken pox    Hyperlipidemia    Hypertension    Ischemic cardiomyopathy    Lumbar spondylosis 01/11/2012   Sleep apnea    Stroke [facility]) 2010   eye doctor said she had TIA   Ventricular tachycardia (HCC)    Polymorphic   Past Surgical History:  Procedure Laterality Date   ABDOMINAL HYSTERECTOMY  1995   CARDIAC DEFIBRILLATOR PLACEMENT     CHOLECYSTECTOMY  1985   COLONOSCOPY WITH PROPOFOL N/A 02/24/2018   Procedure: COLONOSCOPY WITH PROPOFOL;  Surgeon: Rachael Fee, MD;  Location: [facility] ENDOSCOPY;  Service: Endoscopy;  Laterality: N/A;   ICD  2003/2007   implanted by Dr Alanda Amass, most recent generator change 2/13 by Dr Johney Frame, Analyze ST study patient   ICD GENERATOR CHANGEOUT N/A 04/11/2020   Procedure: ICD GENERATOR CHANGEOUT;  Surgeon: Hillis Range, MD;  Location: [facility] INVASIVE CV LAB;  Service: Cardiovascular;  Laterality: N/A;   IMPLANTABLE CARDIOVERTER DEFIBRILLATOR (ICD) GENERATOR CHANGE N/A 05/05/2011   Procedure: ICD GENERATOR CHANGE;  Surgeon: Hillis Range, MD;  Location: [facility] CATH LAB;  Service: Cardiovascular;  Laterality: N/A;   LEFT HEART CATH AND CORONARY ANGIOGRAPHY N/A 10/14/2022   Procedure: LEFT HEART CATH AND CORONARY ANGIOGRAPHY;  Surgeon: Kathleene Hazel,  MD;  Location: [facility] INVASIVE CV LAB;  Service: Cardiovascular;  Laterality: N/A;   LEFT HEART CATHETERIZATION WITH CORONARY ANGIOGRAM N/A 01/30/2011   Procedure: LEFT HEART CATHETERIZATION WITH CORONARY ANGIOGRAM;  Surgeon: Laurey Morale, MD;  Location: [facility] CATH LAB;  Service: Cardiovascular;  Laterality: N/A;   POLYPECTOMY  02/24/2018   Procedure: POLYPECTOMY;  Surgeon: Rachael Fee, MD;  Location: [facility] ENDOSCOPY;  Service: Endoscopy;;   TUBALIGATION  1980     A IV Location/Drains/Wounds Patient  Lines/Drains/Airways Status     Active Line/Drains/Airways     Name Placement date Placement time Site Days   Peripheral IV 12/07/22 20 G Distal;Posterior;Right Forearm 12/07/22  1335  Forearm  less than 1            Intake/Output Last 24 hours No intake or output data in the 24 hours ending 12/07/22 1359  Labs/Imaging Results for orders placed or performed during the hospital encounter of 12/07/22 (from the past 48 hour(s))  Basic metabolic panel     Status: Abnormal   Collection Time: 12/07/22 12:10 PM  Result Value Ref Range   Sodium 142 135 - 145 mmol/L   Potassium 4.0 3.5 - 5.1 mmol/L   Chloride 103 98 - 111 mmol/L   CO2 28 22 - 32 mmol/L   Glucose, Bld 190 (H) 70 - 99 mg/dL    Comment: Glucose reference range applies only to samples taken after fasting for at least 8 hours.   BUN 31 (H) 8 - 23 mg/dL   Creatinine, Ser 2.13 (H) 0.44 - 1.00 mg/dL   Calcium 8.5 (L) 8.9 - 10.3 mg/dL   GFR, Estimated 22 (L) >60 mL/min    Comment: (NOTE) Calculated using the CKD-EPI Creatinine Equation (2021)    Anion gap 11 5 - 15    Comment: Performed at [facility], [address], [place], [place] [address]  CBC     Status: Abnormal   Collection Time: 12/07/22 12:10 PM  Result Value Ref Range   WBC 6.1 4.0 - 10.5 K/uL   RBC 4.21 3.87 - 5.11 MIL/uL   Hemoglobin 11.9 (L) 12.0 - 15.0 g/dL   HCT 84.6 96.2 - 95.2 %   MCV 93.1 80.0 - 100.0 fL   MCH 28.3 26.0 - 34.0 pg   MCHC 30.4 30.0 - 36.0 g/dL   RDW 84.1 (H) 32.4 - 40.1 %   Platelets 227 150 - 400 K/uL   nRBC 0.0 0.0 - 0.2 %    Comment: Performed at [facility], [address], [place], [place] [address]  Troponin I (High Sensitivity)     Status: Abnormal   Collection Time: 12/07/22 12:10 PM  Result Value Ref Range   Troponin I (High Sensitivity) 43 (H) <18 ng/L    Comment: (NOTE) Elevated high sensitivity troponin I (hsTnI) values and significant  changes across serial measurements may suggest ACS but many other   chronic and acute conditions are known to elevate hsTnI results.  Refer to the "Links" section for chest pain algorithms and additional  guidance. Performed at [facility], [address], [place], [place] [address]   POC occult blood, ED     Status: None   Collection Time: 12/07/22 12:55 PM  Result Value Ref Range   Fecal Occult Bld NEGATIVE NEGATIVE   *Note: Due to a large number of results and/or encounters for the requested time period, some results have not been displayed. A complete set of results  can be found in Results Review.   No results found.  Pending Labs Unresulted Labs (From admission, onward)     Start     Ordered   12/08/22 0500  Basic metabolic panel  Tomorrow morning,   R        12/07/22 1358   12/07/22 1254  Brain natriuretic peptide  Once,   URGENT        12/07/22 1253   12/07/22 1252  Protime-INR  Once,   STAT        12/07/22 1251   12/07/22 1252  Type and screen [name]  Once,   STAT       Comments: [facility]    12/07/22 1251            Vitals/Pain Today's Vitals   12/07/22 1204 12/07/22 1205 12/07/22 1208 12/07/22 1326  BP: (!) 87/64  93/61 110/79  Pulse: (!) 106   (!) 107  Resp: 18   (!) 24  Temp: 98.6 F (37 C)     TempSrc: Oral     SpO2: 97%   99%  PainSc:  0-No pain      Isolation Precautions No active isolations  Medications Medications  aspirin EC tablet 81 mg (has no administration in time range)  nitroGLYCERIN (NITROSTAT) SL tablet 0.4 mg (has no administration in time range)  acetaminophen (TYLENOL) tablet 650 mg (has no administration in time range)    Mobility walks     Focused Assessments Cardiac Assessment Handoff:    Lab Results  Component Value Date   CKTOTAL 98 01/28/2011   CKMB 2.8 01/28/2011   TROPONINI <0.30 01/28/2011   No results found for: "DDIMER" Does the Patient currently have chest pain? No    R Recommendations: See Admitting Provider  Note  Report given to:   Additional Notes:

## 2022-12-07 NOTE — Transfer of Care (Signed)
Immediate Anesthesia Transfer of Care Note  Patient: [name]  Procedure(s) Performed: Floyce Stakes ABLATION  Patient Location: Cath Lab  Anesthesia Type:MAC  Level of Consciousness: awake, alert , oriented, patient cooperative, and responds to stimulation  Airway & Oxygen Therapy: Patient Spontanous Breathing and Patient connected to face mask oxygen  Post-op Assessment: Report given to RN and Post -op Vital signs reviewed and stable  Post vital signs: Reviewed and stable  Last Vitals:  Vitals Value Taken Time  BP 110/57 12/07/22 1530  Temp    Pulse 55 12/07/22 1534  Resp 16 12/07/22 1534  SpO2 90 % 12/07/22 1534  Vitals shown include unfiled device data.  Last Pain:  Vitals:   12/07/22 1527  TempSrc:   PainSc: 0-No pain         Complications: No notable events documented.

## 2022-12-07 NOTE — ED Provider Notes (Signed)
[place] EMERGENCY DEPARTMENT AT [facility] Provider Note   CSN: [id number] Arrival date & time: 12/07/22  1155     History  Chief Complaint  Patient presents with   Shortness of Breath    [name] is a 77 y.o. female.  77 year old female with prior medical history as detailed below presents for evaluation. Patient sent from cardiology clinic for evaluation. Patient with possible slow VT on evaluation in cardiology clinic.   Patient appears to be comfortable on arrival. She complains of some exertional weakness and mild shortness of breath with exertion. She denies chest pain.  The history is provided by the patient and medical records.       Home Medications Prior to Admission medications   Medication Sig Start Date End Date Taking? Authorizing Provider  acetaminophen (TYLENOL) 325 MG tablet Take 650 mg by mouth every 6 (six) hours as needed for moderate pain.    [provider]  amiodarone (PACERONE) 200 MG tablet Take 1 tablet (200 mg total) by mouth daily. 11/15/22   Jeanella Craze, NP  amiodarone (PACERONE) 400 MG tablet Take one 400 mg tablet once in am and once in evening for 2 weeks (through 10/30/22), then decrease to one 400 mg daily for 2 weeks (through 11/14/22), then start 200mg  daily (separate prescription) 10/16/22 10/31/22  Jeanella Craze, NP  aspirin EC 81 MG tablet Take 1 tablet (81 mg total) by mouth daily. Swallow whole. 10/17/22   Jeanella Craze, NP  Blood Glucose Monitoring Suppl (ACCU-CHEK NANO SMARTVIEW) W/DEVICE KIT Use to test blood sugar 4 times daily as instructed. Dx code: E11.59 03/01/14   Carlus Pavlov, MD  busPIRone (BUSPAR) 5 MG tablet Take 1 tablet (5 mg total) by mouth 2 (two) times daily. 05/20/21   Milford, Anderson Malta, FNP  carvedilol (COREG) 25 MG tablet Take 1 tablet (25 mg total) by mouth 2 (two) times daily with a meal. 10/16/22   Ollis, Luetta Nutting, NP  Cholecalciferol (VITAMIN D3) 2000 UNITS TABS Take 2,000 Units  by mouth every morning.    [provider]  Continuous Glucose Sensor (FREESTYLE LIBRE 3 SENSOR) MISC 1 each by Does not apply route every 14 (fourteen) days. 11/19/22   Carlus Pavlov, MD  dapagliflozin propanediol (FARXIGA) 10 MG TABS tablet Take 1 tablet (10 mg total) by mouth daily before breakfast. 11/06/22   Carlus Pavlov, MD  dicyclomine (BENTYL) 10 MG capsule TAKE 1 CAPSULE (10 MG TOTAL) BY MOUTH 4 (FOUR) TIMES DAILY BEFORE MEALS AND AT BEDTIME. Patient taking differently: Take 20 mg by mouth 2 (two) times daily. 02/09/20   Corwin Levins, MD  Dulaglutide (TRULICITY) 3 MG/0.5ML SOPN Inject 3 mg into the skin once a week. Patient taking differently: Inject 3 mg into the skin once a week. Mondays 03/13/22   Carlus Pavlov, MD  ELIQUIS 5 MG TABS tablet TAKE 1 TABLET BY MOUTH TWICE A DAY Patient taking differently: Take 5 mg by mouth 2 (two) times daily. 12/22/21   Laurey Morale, MD  empagliflozin (JARDIANCE) 10 MG TABS tablet Take 1 tablet (10 mg total) by mouth every morning. 11/11/21   Carlus Pavlov, MD  fluticasone (FLONASE) 50 MCG/ACT nasal spray Place 1 spray into both nostrils daily as needed for allergies or rhinitis. 03/13/19 07/15/23  [provider]  furosemide (LASIX) 20 MG tablet TAKE 1 TABLET BY MOUTH EVERY DAY Patient taking differently: Take 20 mg by mouth 2 (two) times daily. 08/21/22   Milford,  Anderson Malta, FNP  gabapentin (NEURONTIN) 100 MG capsule Take 2 capsules (200 mg total) by mouth 2 (two) times daily. 06/25/22   Carlus Pavlov, MD  insulin aspart (NOVOLOG FLEXPEN) 100 UNIT/ML FlexPen Inject up to 15 units daily under skin as advised Patient taking differently: Inject 0-15 Units into the skin 3 (three) times daily. Sliding scale 06/24/22   Carlus Pavlov, MD  insulin detemir (LEVEMIR) 100 UNIT/ML injection Inject 0.14 mLs (14 Units total) into the skin at bedtime. 10/16/22   Jeanella Craze, NP  levETIRAcetam (KEPPRA) 250 MG tablet TAKE 1  TABLET BY MOUTH TWICE A DAY 10/14/22   Windell Norfolk, MD  loperamide (IMODIUM) 2 MG capsule Take 1 capsule (2 mg total) by mouth as needed for diarrhea or loose stools. Patient taking differently: Take 2 mg by mouth daily as needed for diarrhea or loose stools. 10/06/22   Noralee Stain, DO  losartan (COZAAR) 25 MG tablet Take 1 tablet (25 mg total) by mouth daily. Patient taking differently: Take 25 mg by mouth every evening. 06/15/22 06/15/23  Graciella Freer, PA-C  magnesium oxide (MAG-OX) 400 (240 Mg) MG tablet TAKE 1 TABLET BY MOUTH TWICE A DAY Patient taking differently: Take 400 mg by mouth 2 (two) times daily. 12/19/21   Corwin Levins, MD  Multiple Vitamins-Minerals (EYE VITAMINS PO) Take 1 tablet by mouth 2 (two) times daily.    [provider]  nitroGLYCERIN (NITROSTAT) 0.4 MG SL tablet Place 1 tablet (0.4 mg total) under the tongue every 5 (five) minutes as needed for chest pain. 05/20/21   Milford, Anderson Malta, FNP  omeprazole (PRILOSEC OTC) 20 MG tablet Take 20 mg by mouth every morning.    [provider]  ranolazine (RANEXA) 500 MG 12 hr tablet TAKE 1 TABLET BY MOUTH TWICE A DAY 11/26/22   Camnitz, Andree Coss, MD  rosuvastatin (CRESTOR) 40 MG tablet Take 1 tablet (40 mg total) by mouth daily. NEEDS FOLLOW UP APPOINTMENT FOR MORE REFILLS Patient taking differently: Take 40 mg by mouth every evening. 09/17/22   Laurey Morale, MD  sertraline (ZOLOFT) 50 MG tablet Take 50 mg by mouth at bedtime. 09/17/22   [provider]      Allergies    Veltassa [patiromer], Darvon, Mexiletine hcl, Promethazine hcl, Ranexa [ranolazine er], Spironolactone, and Plavix [clopidogrel bisulfate]    Review of Systems   Review of Systems  All other systems reviewed and are negative.   Physical Exam Updated Vital Signs BP 93/61 (BP Location: Left Arm)   Pulse (!) 106   Temp 98.6 F (37 C) (Oral)   Resp 18   SpO2 97%  Physical Exam Vitals and nursing note reviewed.   Constitutional:      General: She is not in acute distress.    Appearance: Normal appearance. She is well-developed.  HENT:     Head: Normocephalic and atraumatic.  Eyes:     Conjunctiva/sclera: Conjunctivae normal.     Pupils: Pupils are equal, round, and reactive to light.  Cardiovascular:     Rate and Rhythm: Normal rate and regular rhythm.     Heart sounds: Normal heart sounds.  Pulmonary:     Effort: Pulmonary effort is normal. No respiratory distress.     Breath sounds: Normal breath sounds.  Abdominal:     General: There is no distension.     Palpations: Abdomen is soft.     Tenderness: There is no abdominal tenderness.  Musculoskeletal:  General: No deformity. Normal range of motion.     Cervical back: Normal range of motion and neck supple.  Skin:    General: Skin is warm and dry.  Neurological:     General: No focal deficit present.     Mental Status: She is alert and oriented to person, place, and time.    ED Results / Procedures / Treatments   Labs (all labs ordered are listed, but only abnormal results are displayed) Labs Reviewed  CBC - Abnormal; Notable for the following components:      Result Value   Hemoglobin 11.9 (*)    RDW 15.9 (*)    All other components within normal limits  BASIC METABOLIC PANEL  PROTIME-INR  BRAIN NATRIURETIC PEPTIDE  POC OCCULT BLOOD, ED  TYPE AND SCREEN  TROPONIN I (HIGH SENSITIVITY)    EKG None  Radiology No results found.  Procedures Procedures    Medications Ordered in ED Medications - No data to display  ED Course/ Medical Decision Making/ A&P                                 Medical Decision Making Amount and/or Complexity of Data Reviewed Labs: ordered. Radiology: ordered.  Risk Decision regarding hospitalization.    Medical Screen Complete  This patient presented to the ED with complaint of weakness.  This complaint involves an extensive number of treatment options. The initial  differential diagnosis includes, but is not limited to, arrythmia, anemia, metabolic abnormality, etc  This presentation is: Acute, Self-Limited, Previously Undiagnosed, Uncertain Prognosis, Complicated, Systemic Symptoms, and Threat to Life/Bodily Function  Patient sent from cardiology clinic for evaluation. Cardiology with concern for possible slow VT on their evaluation in clinic.  Patient appears to be stable on arrival. Patient was complaining of mild weakness - most noted with exertion.  Patient to be admitted to cardiology service for further evaluation/treatment.  Additional history obtained:  External records from outside sources obtained and reviewed including prior ED visits and prior Inpatient records.    Lab Tests:  I ordered and personally interpreted labs.  The pertinent results include:  CBC BMP Trop BNP    Imaging Studies ordered:  I ordered imaging studies including CXR  I independently visualized and interpreted obtained imaging which showed NAD I agree with the radiologist interpretation.   Problem List / ED Course:  Weakness/SOB/Arrhythmia    Reevaluation:  After the interventions noted above, I reevaluated the patient and found that they have: stayed the same   Disposition:  After consideration of the diagnostic results and the patients response to treatment, I feel that the patent would benefit from admission.          Final Clinical Impression(s) / ED Diagnoses Final diagnoses:  Dyspnea, unspecified type    Rx / DC Orders ED Discharge Orders     None         Wynetta Fines, MD 12/07/22 1955

## 2022-12-07 NOTE — ED Notes (Signed)
Pt was in VT at Dr. Isidore Moos.

## 2022-12-07 NOTE — Progress Notes (Signed)
Electrophysiology Office Note:   ID:  [name], [name] [birth year], MRN [id number]  Primary Cardiologist: [name], MD Electrophysiologist: Regan Lemming, MD      History of Present Illness:   [name] is a 77 y.o. female with h/o hypothyroidism, HLD, CAD, ICM / chronic systolic CHF (LVEF 25-30%), G1 DD, AF and VT  seen today for post hospital follow up.    Admitted admitted 7/22-726 for VT/VF storm. 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 Most recently VT/VF on 7/21 s/p ICD shock x2 - she was unaware of events but called son disoriented after episodes. Ranexa was not tolerated on recent trial due to diarrhea (improved after stopping). IV amiodarone initiated on admit. No further episodes of sustained VT/shock. AKI resolved  LHC 10/14/2022  Patent mid LAD stent with minimal restenosis.  Severe stenosis in the very distal AV groove Circumflex artery leading into a very small obtuse marginal branch-too small for PCI Large dominant RCA with patent proximal and mid stents with minimal restenosis.  NO targets for PCI LVEDP 15 mmHg  Since discharge from hospital the patient reports doing OK overall up until the last 4-5 days. She has been having worsening fatigue and SOB over that time period. Denies fevers, chills, syncope, missing her medications, or sick contacts.  Review of systems complete and found to be negative unless listed in HPI.   EP Information / Studies Reviewed:    EKG is ordered today. Personal review as below.  EKG Interpretation Date/Time:  Monday December 07 2022 11:09:51 EDT Ventricular Rate:  105 PR Interval:    QRS Duration:  106 QT Interval:  412 QTC Calculation: 544 R Axis:   -57  Text Interpretation: Accelerated Junctional rhythm vs slow VT by device interrogation  Confirmed by Maxine Glenn [phone]) on 12/07/2022 12:58:24 PM    ICD Interrogation-  reviewed in detail today,  See PACEART report.  DEVICE HISTORY:  EP Study 2003  > inducible PMVT  Abbott / SJM Dual Chamber ICD, initial implant 2003, 2007.  Gen Change: 2013, 03/2020 - at 03/2020 gen change, SJM SJM Riata ST 7040 lead was fluoroscopically and electrically normal, opted to use this lead rather than replacing it after a long discussion with the patient prior to the procedure  2012 PMVT with ICD shocks 04/2021 appropriate tx for VT with HV therapy   AAD Hx 2007 amiodarone started quickly stopped 2/2 nausea Remotely as well tried on Sotalol and Multaq unclear when/why they were stopped 2012 restarted on amiodarone 2024 Mexiletine intolerant, transitioned to Ranexa   Physical Exam:   VS:  BP (!) 102/58   Pulse (!) 105   Ht 5\' 3"  (1.6 m)   Wt 186 lb 3.2 oz (84.5 kg)   SpO2 96%   BMI 32.98 kg/m    Wt Readings from Last 3 Encounters:  12/07/22 186 lb 3.2 oz (84.5 kg)  11/19/22 187 lb 9.6 oz (85.1 kg)  10/13/22 190 lb 4.8 oz (86.3 kg)     GEN: Well nourished, well developed in no acute distress NECK: No JVD; No carotid bruits CARDIAC: Regular rate and rhythm, no murmurs, rubs, gallops RESPIRATORY:  Clear to auscultation without rales, wheezing or rhonchi  ABDOMEN: Soft, non-tender, non-distended EXTREMITIES:  No edema; No deformity   ASSESSMENT AND PLAN:    Chronic systolic dysfunction s/p Abbott dual chamber ICD  euvolemic today Stable on an appropriate medical regimen Normal ICD function See Pace Art report No changes today  VT/VF  Storm Longstanding hx of VT. VF is new for her, r/o ischemia.  Non-obs disease on [facility] 7/24, no targets for intervention  Interrogation today concerning for VT given recent admission for same, though ECG with more narrow QRS and could also represent accelerated junctional rhythm.    CAD  Continue coreg, ASA 81mg  every day [facility] 09/2022 as above with no inteventional targets Denies s/s ischemia   Atrial Fibrillation  Low burden overall on amiodarone for her VT Continue eliquis Continue beta blocker     Secondary hypercoagulable state Pt on Eliquis as above    With on-going AV dissociation and symptoms discussed with Dr. Elberta Fortis who recommended sending pt to [facility] for further evaluation and treatment. Her options for additional treatment of her VT are very limited.   Signed, Graciella Freer, PA-C

## 2022-12-07 NOTE — Patient Instructions (Signed)
Medication Instructions:  Your physician recommends that you continue on your current medications as directed. Please refer to the Current Medication list given to you today.  *If you need a refill on your cardiac medications before your next appointment, please call your pharmacy*  Lab Work: None ordered If you have labs (blood work) drawn today and your tests are completely normal, you will receive your results only by: MyChart Message (if you have MyChart) OR A paper copy in the mail If you have any lab test that is abnormal or we need to change your treatment, we will call you to review the results.  Follow-Up: At [facility], you and your health needs are our priority.  As part of our continuing mission to provide you with exceptional heart care, we have created designated Provider Care Teams.  These Care Teams include your primary Cardiologist (physician) and Advanced Practice Providers (APPs -  Physician Assistants and Nurse Practitioners) who all work together to provide you with the care you need, when you need it.  Please report to the emergency room at [facility] upon leaving our office.

## 2022-12-07 NOTE — ED Triage Notes (Signed)
Pt to ED via POV from Dr. Isidore Moos. Pt states she went to Dr. Today for check up and was sent to ED by Dr. D/t elevated HR. Pt reports HR was 105 at Dr. Isidore Moos. Pt denies CP and denies palpations. Pt does endorse SOB while ambulating. Pt has internal defibrillator. Pt has hx of a fib.

## 2022-12-08 DIAGNOSIS — I472 Ventricular tachycardia, unspecified: Secondary | ICD-10-CM | POA: Diagnosis not present

## 2022-12-08 LAB — BASIC METABOLIC PANEL
Anion gap: 7 (ref 5–15)
BUN: 31 mg/dL — ABNORMAL HIGH (ref 8–23)
CO2: 28 mmol/L (ref 22–32)
Calcium: 8 mg/dL — ABNORMAL LOW (ref 8.9–10.3)
Chloride: 103 mmol/L (ref 98–111)
Creatinine, Ser: 2.1 mg/dL — ABNORMAL HIGH (ref 0.44–1.00)
GFR, Estimated: 24 mL/min — ABNORMAL LOW (ref >60)
Glucose, Bld: 183 mg/dL — ABNORMAL HIGH (ref 70–99)
Potassium: 4.3 mmol/L (ref 3.5–5.1)
Sodium: 138 mmol/L (ref 135–145)

## 2022-12-08 LAB — GLUCOSE, CAPILLARY
Glucose-Capillary: 144 mg/dL — ABNORMAL HIGH (ref 70–99)
Glucose-Capillary: 193 mg/dL — ABNORMAL HIGH (ref 70–99)
Glucose-Capillary: 178 mg/dL — ABNORMAL HIGH (ref 70–99)
Glucose-Capillary: 160 mg/dL — ABNORMAL HIGH (ref 70–99)

## 2022-12-08 NOTE — TOC Initial Note (Signed)
Transition of Care [facility]) - Initial/Assessment Note    Patient Details  Name: [name] MRN: [id number] Date of Birth: [birth year]  Transition of Care [facility]) CM/SW Contact:    Leone Haven, RN Phone Number: 12/08/2022, 2:58 PM  Clinical Narrative:                 From home with son,  has no PCP, would like to see [facility] doctor til she gets her a PCP and  has insurance on file, states has no HH services in place at this time , she has a rolling walker at home.  States son will transport her  home at [employer] and he is support system, states [facility] [place].  Pta ambulatory with walker. She states she does not have a Legal Guardian.    Expected Discharge Plan: Home w Home Health Services Barriers to Discharge: Continued Medical Work up   Patient Goals and CMS Choice Patient states their goals for this hospitalization and ongoing recovery are:: return home   Choice offered to / list presented to : NA      Expected Discharge Plan and Services In-house Referral: NA Discharge Planning Services: CM Consult Post Acute Care Choice: NA Living arrangements for the past 2 months: Single Family Home                 DME Arranged: N/A DME Agency: NA       HH Arranged: NA          Prior Living Arrangements/Services Living arrangements for the past 2 months: Single Family Home Lives with:: Adult Children (son) Patient language and need for interpreter reviewed:: Yes Do you feel safe going back to the place where you live?: Yes      Need for Family Participation in Patient Care: Yes (Comment) Care giver support system in place?: Yes (comment) Current home services:  (rolling walker) Criminal Activity/Legal Involvement Pertinent to Current Situation/Hospitalization: No - Comment as needed  Activities of Daily Living Home Assistive Devices/Equipment: Blood pressure cuff, CBG Meter, Walker (specify type) ADL Screening (condition at time of  admission) Patient's cognitive ability adequate to safely complete daily activities?: Yes Is the patient deaf or have difficulty hearing?: No Does the patient have difficulty seeing, even when wearing glasses/contacts?: No Does the patient have difficulty concentrating, remembering, or making decisions?: No Patient able to express need for assistance with ADLs?: Yes Does the patient have difficulty dressing or bathing?: No Independently performs ADLs?: Yes (appropriate for developmental age) Does the patient have difficulty walking or climbing stairs?: Yes Weakness of Legs: Both Weakness of Arms/Hands: None  Permission Sought/Granted Permission sought to share information with : Case Manager Permission granted to share information with : Yes, Verbal Permission Granted              Emotional Assessment Appearance:: Appears stated age Attitude/Demeanor/Rapport: Engaged Affect (typically observed): Appropriate Orientation: : Oriented to Self, Oriented to Place, Oriented to  Time, Oriented to Situation Alcohol / Substance Use: Not Applicable Psych Involvement: No (comment)  Admission diagnosis:  VT (ventricular tachycardia) (HCC) [I47.20] Dyspnea, unspecified type [R06.00] Patient Active Problem List   Diagnosis Date Noted   Ventricular fibrillation (HCC) 10/12/2022   Ventricular tachycardia (HCC) 10/02/2022   Acute kidney injury superimposed on chronic kidney disease (HCC) 10/02/2022   Diarrhea 10/02/2022   Seizure disorder (HCC) 10/02/2022   VT (ventricular tachycardia) (HCC) 06/11/2022   Osteoarthritis 09/01/2021   DM (diabetes mellitus), type 2  with renal complications (HCC) 08/31/2021   Chronic kidney disease, stage 3b (HCC) 08/31/2021   Encounter for well adult exam with abnormal findings 07/30/2018   Polyp of ascending colon    Hypothyroidism 08/02/2017   Personal history of ventricular tachycardia 08/02/2017   Diabetic peripheral neuropathy associated with type 2  diabetes mellitus (HCC) 07/02/2017   Hyperlipidemia 07/02/2017   Hypotension 05/06/2015   Colon cancer screening 12/21/2014   Acute sinus infection 06/06/2014   Allergic rhinitis 06/06/2014   Arthritis of knee, left 04/30/2014   Knee MCL sprain 03/28/2014   Pes anserine bursitis 02/23/2014   Spinal stenosis of lumbar region 02/23/2014   Lower back pain 01/23/2014   Bilateral knee pain 01/23/2014   Left lateral abdominal pain 01/23/2014   UTI (urinary tract infection) 08/03/2013   Encounter for therapeutic drug monitoring 04/26/2013   Chronic systolic CHF (congestive heart failure) (HCC) 03/20/2013   CKD (chronic kidney disease) 03/20/2013   HTN (hypertension) 12/15/2012   Obesity, unspecified 12/15/2012   Type 2 diabetes, uncontrolled, with circulatory disorder 12/15/2012   Left knee pain 08/30/2012   Lumbar spondylosis 01/11/2012   Coronary artery disease 01/31/2011   Implantable cardioverter-defibrillator (ICD) in situ 01/27/2011     ventricular tachycardia-polymorphic 01/27/2011   Ischemic cardiomyopathy 01/27/2011   PAF (paroxysmal atrial fibrillation) (HCC) 01/27/2011   PCP:  Pcp, No Pharmacy:   [id number] [place] - [address] [place] [address] Phone: [phone] Fax: [phone]     Social Determinants of Health (SDOH) Social History: SDOH Screenings   Food Insecurity: No Food Insecurity (12/08/2022)  Housing: Low Risk  (12/08/2022)  Transportation Needs: No Transportation Needs (12/08/2022)  Recent Concern: Transportation Needs - Unmet Transportation Needs (10/12/2022)  Utilities: Not At Risk (12/08/2022)  Depression (PHQ2-9): Low Risk  (07/29/2018)  Financial Resource Strain: Patient Declined (07/17/2022)   Received from [facility], [facility]  Physical Activity: Insufficiently Active (07/16/2022)   Received from [facility], [facility]  Social Connections: Socially Integrated (07/16/2022)   Received  from [facility], [facility]  Stress: Stress Concern Present (07/16/2022)   Received from [facility], [facility]  Tobacco Use: Medium Risk (12/07/2022)   SDOH Interventions:     Readmission Risk Interventions    10/06/2022   10:37 AM  Readmission Risk Prevention Plan  PCP or Specialist Appt within 5-7 Days Complete  Home Care Screening Complete  Medication Review (RN CM) Complete

## 2022-12-08 NOTE — Plan of Care (Signed)
Problem: Activity: Goal: Risk for activity intolerance will decrease Outcome: Progressing

## 2022-12-08 NOTE — Progress Notes (Addendum)
Rounding Note    Patient Name: [name] Date of Encounter: 12/08/2022  [facility] Cardiologist: [name], MD   Subjective   Feels well  Inpatient Medications    Scheduled Meds:  amiodarone  200 mg Oral Daily   apixaban  5 mg Oral BID   aspirin EC  81 mg Oral Daily   busPIRone  5 mg Oral BID   carvedilol  25 mg Oral BID WC   dicyclomine  20 mg Oral BID   gabapentin  200 mg Oral BID   insulin aspart  0-15 Units Subcutaneous TID WC   insulin aspart  0-5 Units Subcutaneous QHS   levETIRAcetam  250 mg Oral BID   magnesium oxide  400 mg Oral BID   mexiletine  150 mg Oral Q12H   rosuvastatin  40 mg Oral QPM   sertraline  50 mg Oral QHS   Continuous Infusions:  PRN Meds: acetaminophen, nitroGLYCERIN   Vital Signs    Vitals:   12/07/22 2100 12/07/22 2209 12/08/22 0339 12/08/22 0754  BP: 116/67 (!) 149/53 (!) 116/45 (!) 107/46  Pulse: (!) 54 (!) 55 (!) 55 (!) 55  Resp: 17  16 16   Temp:  98.7 F (37.1 C) 97.9 F (36.6 C) 97.7 F (36.5 C)  TempSrc:  Oral Oral Oral  SpO2: 99% 98% 95% 94%  Weight:  85 kg 85 kg     Intake/Output Summary (Last 24 hours) at 12/08/2022 1014 Last data filed at 12/08/2022 0753 Gross per 24 hour  Intake 50 ml  Output --  Net 50 ml      12/08/2022    3:39 AM 12/07/2022   10:09 PM 12/07/2022   11:05 AM  Last 3 Weights  Weight (lbs) 187 lb 8 oz 187 lb 8 oz 186 lb 3.2 oz  Weight (kg) 85.049 kg 85.049 kg 84.46 kg      Telemetry    AP/VS 50's - Personally Reviewed  ECG    No new EKGs - Personally Reviewed  Physical Exam    Examined by Dr. Elberta Fortis GEN: No acute distress.   Neck: No JVD Cardiac: RRR, no murmurs, rubs, or gallops.  Respiratory: CTA b/l GI: Soft, nontender, non-distended  MS: No edema; No deformity. Neuro:  Nonfocal  Psych: Normal affect   Labs    High Sensitivity Troponin:   Recent Labs  Lab 12/07/22 1210 12/07/22 1410  TROPONINIHS 43* 42*     Chemistry Recent Labs  Lab  12/07/22 1210 12/08/22 0325  NA 142 138  K 4.0 4.3  CL 103 103  CO2 28 28  GLUCOSE 190* 183*  BUN 31* 31*  CREATININE 2.25* 2.10*  CALCIUM 8.5* 8.0*  GFRNONAA 22* 24*  ANIONGAP 11 7    Lipids No results for input(s): "CHOL", "TRIG", "HDL", "LABVLDL", "LDLCALC", "CHOLHDL" in the last 168 hours.  Hematology Recent Labs  Lab 12/07/22 1210  WBC 6.1  RBC 4.21  HGB 11.9*  HCT 39.2  MCV 93.1  MCH 28.3  MCHC 30.4  RDW 15.9*  PLT 227   Thyroid No results for input(s): "TSH", "FREET4" in the last 168 hours.  BNP Recent Labs  Lab 12/07/22 1337  BNP 257.9*    DDimer No results for input(s): "DDIMER" in the last 168 hours.   Radiology    DG Chest 2 View Result Date: 12/07/2022 CLINICAL DATA:  Shortness of breath EXAM: CHEST - 2 VIEW COMPARISON:  10/12/2022 FINDINGS: Left-sided implanted cardiac device remains in place.  Stable heart size. Low lung volumes. No focal airspace consolidation, pleural effusion, or pneumothorax. IMPRESSION: No active cardiopulmonary disease. Electronically Signed   By: Duanne Guess D.O.   On: 12/07/2022 14:20    Cardiac Studies   10/14/22: LHC   Prox RCA to Mid RCA lesion is 20% stenosed.   Dist RCA lesion is 40% stenosed.   Dist Cx lesion is 99% stenosed.   Mid LAD lesion is 20% stenosed.   Ost LAD to Mid LAD lesion is 20% stenosed.   Mid LAD to Dist LAD lesion is 20% stenosed.   2nd Diag lesion is 50% stenosed.   3rd Diag lesion is 40% stenosed.   Ost LM lesion is 20% stenosed.   Patent mid LAD stent with minimal restenosis.  Severe stenosis in the very distal AV groove Circumflex artery leading into a very small obtuse marginal branch-too small for PCI Large dominant RCA with patent proximal and mid stents with minimal restenosis.  NO targets for PCI LVEDP 15 mmHg   Recommendations: Continue medical management of CAD. Continue ASA and resume Eliquis tomorrow. Given ACS, would consider adding Plavix but she has a Plavix allergy. I would  not add Brilinta or Effient given advanced age and need for Eliquis.      06/15/22: TTE  1. Left ventricular ejection fraction, by estimation, is 25 to 30%. The  left ventricle has severely decreased function. The left ventricle  demonstrates global hypokinesis. Left ventricular diastolic parameters are  consistent with Grade I diastolic  dysfunction (impaired relaxation).   2. Right ventricular systolic function is normal. The right ventricular  size is normal. There is normal pulmonary artery systolic pressure.   3. Left atrial size was mildly dilated.   4. The mitral valve is normal in structure. Moderate mitral valve  regurgitation.   5. The aortic valve is grossly normal. Aortic valve regurgitation is  moderate to severe.   6. The inferior vena cava is dilated in size with >50% respiratory  variability, suggesting right atrial pressure of 8 mmHg.     Patient Profile     77 y.o. female with CAD (s/p LAD and RCA PCI.  Last LHC in 11/12 with patent proximal LAD stent, ostial 70% D1 (jailed by stent), mild LAD stent patent, patent RCA stents), ICM, chronic CHF, VT, ICD, AFib, stroke, DM, HTN, HLD, CKD (III), gastroparesis, Hypothyroidism  who was admitted with recurrent VT  Device information SJM dual chamber ICD, implanted 2003,  2007, gen changes 2013, Jan 2022 2003 had syncope >> EP study with inducible PMVT + h/o PMVT 2012 with ICD shocks 04/24/21 appropriate tx for VT w/HV tx March 2024 (MMVT) July 2024 (MMVT) July 2024 (VF)   At the time of her gen change Jan 2022,  SJM Riata ST 7040 lead was fluoroscopically and electrically normal today, opted to use this lead rather than replacing it after a long discussion with the patient prior to the procedure.     AAD Hx 2007 amiodarone started quickly stopped 2/2 nausea Remotely as well tried on Sotalol and Multaq unclear when/why they were stopped Intolerant of Ranexa and mexiletine (GI especially) 2012 restarted on  amiodarone   Assessment & Plan    VT Recurrent given slow rate under her detection, unknown for how long though she has not felt well for a few days yesterdays VT looks similar to March, July was wider Intolerant of numerous meds   Planned for EP/ablation yesterday though unfortunately (or fortunately) s/p versed >>  converted to AP/VS rhythm and procedure not pursued  Try lower dose of mexiletine, hopefully she Jasie Meleski be able t tolerate, didn't get her dose last night May look towards elective planned ablation in the future    CAD No CP Cath in July as above Cx disease to small to target PCI, non-obstructive otherwise    ICM Chronic CHF Does not appear overtly volume OL  CorVue is above threshold Hold med for today, look to resume her HF meds tomorrow   She did not inject her Trilicity this AM (Mondays) She is not on either of the SGLT2i listed 2/2 cost   Paroxysmal AFib CHA2DS2Vasc is 9, on Eliquis    AKI Follow, likely 2/2 VT A little better today, volume stable Hold meds   DM SSI  For questions or updates, please contact [facility] Please consult [email or web address] for contact info under        Signed, Sheilah Pigeon, PA-C  12/08/2022, 10:14 AM    I have seen and examined this patient with Francis Dowse.  Agree with above, note added to reflect my findings.  No further ventricular arrhythmias noted.  Patient is feeling well without complaint.  GEN: Well nourished, well developed, in no acute distress  HEENT: normal  Neck: no JVD, carotid bruits, or masses Cardiac: RRR; no murmurs, rubs, or gallops,no edema  Respiratory:  clear to auscultation bilaterally, normal work of breathing GI: soft, nontender, nondistended, + BS MS: no deformity or atrophy  Skin: warm and dry, device site well healed Neuro:  Strength and sensation are intact Psych: euthymic mood, full affect   Ventricular tachycardia: Converted prior to ablation yesterday and thus no  ablation was performed.  Currently on amiodarone.  Mexiletine started today.  If she tolerates lower dose mexiletine, Gaelyn Tukes plan for discharge tomorrow. Coronary artery disease: Catheterization as above.  No plan for reintervention. Chronic systolic heart failure: No obvious volume overload.  Continue home medications Paroxysmal atrial fibrillation: Continue Eliquis Acute kidney injury: Likely due to VT.  Creatinine improving  Eulalio Reamy M. Concetta Guion MD 12/08/2022 10:57 AM

## 2022-12-08 NOTE — Progress Notes (Signed)
Heart Failure Navigator Progress Note  Assessed for Heart & Vascular TOC clinic readiness.  Patient does not meet criteria due to Advanced Heart Failure Team patient.   Navigator will sign off at this time.    Rhae Hammock, BSN, Scientist, clinical (histocompatibility and immunogenetics) Only

## 2022-12-09 DIAGNOSIS — I472 Ventricular tachycardia, unspecified: Secondary | ICD-10-CM | POA: Diagnosis not present

## 2022-12-09 LAB — GLUCOSE, CAPILLARY
Glucose-Capillary: 180 mg/dL — ABNORMAL HIGH (ref 70–99)
Glucose-Capillary: 213 mg/dL — ABNORMAL HIGH (ref 70–99)

## 2022-12-09 MED ORDER — MEXILETINE HCL 150 MG PO CAPS: 150.0000 mg | ORAL_CAPSULE | Freq: Two times a day (BID) | ORAL | 5 refills | Status: DC

## 2022-12-09 MED ORDER — CARVEDILOL 25 MG PO TABS: 25.0000 mg | ORAL_TABLET | Freq: Two times a day (BID) | ORAL | 4 refills | Status: DC

## 2022-12-09 MED ORDER — NITROGLYCERIN 0.4 MG SL SUBL: 0.4000 mg | SUBLINGUAL_TABLET | SUBLINGUAL | 3 refills | Status: DC | PRN

## 2022-12-09 NOTE — Plan of Care (Signed)
Problem: Coping: Goal: Ability to adjust to condition or change in health will improve Outcome: Adequate for Discharge

## 2022-12-09 NOTE — TOC Transition Note (Signed)
Transition of Care [facility]) - CM/SW Discharge Note   Patient Details  Name: [name] MRN: [id number] Date of Birth: 1945/09/06  Transition [facility] Care [facility]) CM/SW Contact:  Leone Haven, RN Phone Number: 12/09/2022, 1:25 PM   Clinical Narrative:    Patient is for dc today, son states he wants HHPT  to eval and treat and HHAIDE for mom.  NCM offered choice , he is ok with [facility].  NCM made referral to [facility] with Frances Furbish, he is able to take referral.  Soc will begin 24 to 48 hrs post dc.  Son will transport home.   Final next level of care: Home w Home Health Services Barriers to Discharge: No Barriers Identified   Patient Goals and CMS Choice CMS Medicare.gov Compare Post Acute Care list provided to:: Patient Choice offered to / list presented to : Patient  Discharge Placement                         Discharge Plan and Services Additional resources added to the After Visit Summary for   In-house Referral: NA Discharge Planning Services: CM Consult Post Acute Care Choice: NA          DME Arranged: N/A DME Agency: NA       HH Arranged: PT, Nurse's Aide HH Agency: [facility] Date [facility] Agency Contacted: 12/09/22 Time HH Agency Contacted: 1325 Representative spoke with at [facility] Agency: Kandee Keen  Social Determinants of Health (SDOH) Interventions SDOH Screenings   Food Insecurity: No Food Insecurity (12/08/2022)  Housing: Low Risk  (12/08/2022)  Transportation Needs: No Transportation Needs (12/08/2022)  Recent Concern: Transportation Needs - Unmet Transportation Needs (10/12/2022)  Utilities: Not At Risk (12/08/2022)  Depression (PHQ2-9): Low Risk  (07/29/2018)  Financial Resource Strain: Patient Declined (07/17/2022)   Received from [facility]  Physical Activity: Insufficiently Active (07/16/2022)   Received from [facility], [facility]  Social Connections: Socially Integrated (07/16/2022)   [facility]  Stress: Stress Concern Present (07/16/2022)   Received from [facility], [facility]  Tobacco Use: Medium Risk (12/07/2022)     Readmission Risk Interventions    10/06/2022   10:37 AM  Readmission Risk Prevention Plan  PCP or Specialist Appt within 5-7 Days Complete  Home Care Screening Complete  Medication Review (RN CM) Complete

## 2022-12-09 NOTE — Discharge Summary (Addendum)
DISCHARGE SUMMARY    Patient ID: TIANAH VANHOUTEN,  MRN: [id number], DOB/AGE: 76/24/1947 76 y.o.  Admit date: 12/07/2022 Discharge date: 12/09/2022  Primary Care Physician: Pcp, No  Primary Cardiologist: Dr. Shirlee Latch Electrophysiologist: Dr. Elberta Fortis  Primary Discharge Diagnosis:  VT  Secondary Discharge Diagnosis:  CAD ICM Chronic CHF compensated Paroxysmal Afib CHA2DS2Vasc is 9, on Eliquis HTN DM CKD (III) hypothyroidism  Allergies  Allergen Reactions   Veltassa [Patiromer] Diarrhea   Darvon Other (See Comments)    indigestion   Mexiletine Hcl Other (See Comments)    N/V, tremors, difficulty walking, blurred vision   Promethazine Hcl Other (See Comments)    hyperactivity   Ranexa [Ranolazine Er] Diarrhea   Spironolactone Diarrhea and Nausea And Vomiting   Plavix [Clopidogrel Bisulfate] Rash     Procedures This Admission:  none  Brief HPI: MARSHAI OKUNO is a 77 y.o. female w/PMHx as above, recently struggling with recurrent VT, was seen in the office day of admission for a planned follow up visit, found in a slow, hemodynamically stable VT and referred to the ER  Hospital Course:  The patient was confirmed still in slow VT, stable BP, reported feeling sluggish, unwell for a few days.  VT rate well below her 1st zone.  Given her recent hospitalizations with VT, planned to bring her to the lab for EP/ablation while she was in VT. Unfortunately VT broke just as she was being prepped in the procedure room.  She has been intolerant to several meds, planned to try her at a lower dose of the mexiletine, so far she is tolerating well Labs have been largely unremarkable, except for AKI that has been trending down   She was monitored on telemetry throughout her stay, with no further VT.  The patient feels well, denies any CP/SOB,  She was examined by Dr. Elberta Fortis and considered stable for discharge to home.   She reports the SGLT2i are way too expensive has  not been taking, Hyacinth Marcelli stop Bryanne Riquelme get lab/BMET in 7-10 days, with improving Creat, Khristie Sak resume her lasix, ARB   Physical Exam: Vitals:   12/09/22 0004 12/09/22 0425 12/09/22 0745 12/09/22 1057  BP: (!) 121/47 (!) 132/47 (!) 134/51 130/69  Pulse: (!) 55 (!) 54 (!) 55 (!) 55  Resp: 17 18 17 18   Temp: 99 F (37.2 C) 98.9 F (37.2 C) 98.7 F (37.1 C) 98.5 F (36.9 C)  TempSrc: Oral Oral Oral Oral  SpO2: 94% 100% 98% 93%  Weight:  85.6 kg      GEN- The patient is well appearing, alert and oriented x 3 today.   HEENT: normocephalic, atraumatic; sclera clear, conjunctiva pink; hearing intact; oropharynx clear; neck supple, no JVP Lungs-  CTA b/l, normal work of breathing.  No wheezes, rales, rhonchi Heart- RRR, no murmurs, rubs or gallops, PMI not laterally displaced GI- soft, non-tender, non-distended Extremities- no clubbing, cyanosis, or edema MS- no significant deformity or atrophy Skin- warm and dry, no rash or lesion Psych- euthymic mood, full affect Neuro- no gross deficits   Labs:   Lab Results  Component Value Date   WBC 6.1 12/07/2022   HGB 11.9 (L) 12/07/2022   HCT 39.2 12/07/2022   MCV 93.1 12/07/2022   PLT 227 12/07/2022    Recent Labs  Lab 12/09/22 0412  NA 136  K 4.7  CL 101  CO2 28  BUN 36*  CREATININE 2.12*  CALCIUM 8.2*  GLUCOSE 205*  Discharge Medications:  Allergies as of 12/09/2022       Reactions   Veltassa [patiromer] Diarrhea   Darvon Other (See Comments)   indigestion   Mexiletine Hcl Other (See Comments)   N/V, tremors, difficulty walking, blurred vision   Promethazine Hcl Other (See Comments)   hyperactivity   Ranexa [ranolazine Er] Diarrhea   Spironolactone Diarrhea, Nausea And Vomiting   Plavix [clopidogrel Bisulfate] Rash        Medication List     STOP taking these medications    dapagliflozin propanediol 10 MG Tabs tablet Commonly known as: Farxiga   empagliflozin 10 MG Tabs tablet Commonly known as: JARDIANCE    ranolazine 500 MG 12 hr tablet Commonly known as: RANEXA       TAKE these medications    acetaminophen 325 MG tablet Commonly known as: TYLENOL Take 325 mg by mouth every 6 (six) hours as needed for moderate pain.   amiodarone 200 MG tablet Commonly known as: Pacerone Take 1 tablet (200 mg total) by mouth daily. What changed: Another medication with the same name was removed. Continue taking this medication, and follow the directions you see here.   aspirin EC 81 MG tablet Take 1 tablet (81 mg total) by mouth daily. Swallow whole.   busPIRone 5 MG tablet Commonly known as: BUSPAR Take 1 tablet (5 mg total) by mouth 2 (two) times daily.   carvedilol 25 MG tablet Commonly known as: COREG Take 1 tablet (25 mg total) by mouth 2 (two) times daily with a meal. What changed: Another medication with the same name was removed. Continue taking this medication, and follow the directions you see here.   dicyclomine 10 MG capsule Commonly known as: BENTYL TAKE 1 CAPSULE (10 MG TOTAL) BY MOUTH 4 (FOUR) TIMES DAILY BEFORE MEALS AND AT BEDTIME. What changed: See the new instructions.   Eliquis 5 MG Tabs tablet Generic drug: apixaban TAKE 1 TABLET BY MOUTH TWICE A DAY What changed: how much to take   EYE VITAMINS PO Take 1 tablet by mouth 2 (two) times daily.   fluticasone 50 MCG/ACT nasal spray Commonly known as: FLONASE Place 1 spray into both nostrils daily as needed for allergies or rhinitis.   FreeStyle Libre 3 Sensor Misc 1 each by Does not apply route every 14 (fourteen) days.   furosemide 20 MG tablet Commonly known as: LASIX TAKE 1 TABLET BY MOUTH EVERY DAY What changed: when to take this   gabapentin 100 MG capsule Commonly known as: NEURONTIN Take 2 capsules (200 mg total) by mouth 2 (two) times daily.   insulin detemir 100 UNIT/ML injection Commonly known as: LEVEMIR Inject 0.14 mLs (14 Units total) into the skin at bedtime. What changed: how much to  take Notes to patient: Please confirm with your primary doctor your insulin dosing and schedule   levETIRAcetam 250 MG tablet Commonly known as: KEPPRA TAKE 1 TABLET BY MOUTH TWICE A DAY   loperamide 2 MG capsule Commonly known as: IMODIUM Take 1 capsule (2 mg total) by mouth as needed for diarrhea or loose stools. What changed: when to take this   losartan 25 MG tablet Commonly known as: Cozaar Take 1 tablet (25 mg total) by mouth daily.   magnesium oxide 400 (240 Mg) MG tablet Commonly known as: MAG-OX TAKE 1 TABLET BY MOUTH TWICE A DAY   mexiletine 150 MG capsule Commonly known as: MEXITIL Take 1 capsule (150 mg total) by mouth every 12 (twelve) hours.  nitroGLYCERIN 0.4 MG SL tablet Commonly known as: NITROSTAT Place 1 tablet (0.4 mg total) under the tongue every 5 (five) minutes as needed for chest pain.   NovoLOG FlexPen 100 UNIT/ML FlexPen Generic drug: insulin aspart Inject up to 15 units daily under skin as advised What changed:  how much to take how to take this when to take this additional instructions Notes to patient: Please confirm with your primary doctor insulin dosing and schedule   omeprazole 20 MG capsule Commonly known as: PRILOSEC Take 1 capsule by mouth daily.   rosuvastatin 40 MG tablet Commonly known as: CRESTOR Take 1 tablet (40 mg total) by mouth daily. NEEDS FOLLOW UP APPOINTMENT FOR MORE REFILLS What changed:  when to take this additional instructions   sertraline 50 MG tablet Commonly known as: ZOLOFT Take 50 mg by mouth at bedtime.   Trulicity 3 MG/0.5ML Sopn Generic drug: Dulaglutide Inject 3 mg into the skin once a week. What changed: additional instructions Notes to patient: Please confirm with your primary doctor as we spoke   Vitamin D3 50 MCG (2000 UT) Tabs Take 2,000 Units by mouth every morning.        Disposition: home Discharge Instructions     Diet - low sodium heart healthy   Complete by: As directed     Increase activity slowly   Complete by: As directed        Follow-up Information     [facility]. Go on 12/22/2022.   Specialty: Family Medicine Why: @11 :10am Contact information: [address] [place] [phone]                Duration of Discharge Encounter: Greater than 30 minutes including physician time.  Norma Fredrickson, PA-C 12/09/2022 12:15 PM   I have seen and examined this patient with Francis Dowse.  Agree with above, note added to reflect my findings.  Patient referred to the hospital from clinic due to ventricular tachycardia.  Was initially planned for ablation but terminated to sinus rhythm with atrial pacing intermittently.  Patient had amiodarone continued.  Started on low-dose mexiletine.  Patient tolerated well.  Plan for discharge today with follow-up in clinic.  GEN: Well nourished, well developed, in no acute distress  HEENT: normal  Neck: no JVD, carotid bruits, or masses Cardiac: RRR; no murmurs, rubs, or gallops,no edema  Respiratory:  clear to auscultation bilaterally, normal work of breathing GI: soft, nontender, nondistended, + BS MS: no deformity or atrophy  Skin: warm and dry, device site well healed Neuro:  Strength and sensation are intact Psych: euthymic mood, full affect    Cabria Micalizzi M. Clark Cuff MD 12/09/2022 12:16 PM

## 2022-12-09 NOTE — Progress Notes (Signed)
Pt Iv and Tele removed. TOC will see patient for poss home health set-up before I print discharge paper work

## 2022-12-09 NOTE — Progress Notes (Signed)
Pt just waiting on wheelchair. To discharge

## 2022-12-17 ENCOUNTER — Other Ambulatory Visit: Payer: Self-pay

## 2022-12-17 ENCOUNTER — Ambulatory Visit: Payer: Medicare HMO | Attending: Cardiovascular Disease

## 2022-12-17 DIAGNOSIS — E039 Hypothyroidism, unspecified: Secondary | ICD-10-CM

## 2022-12-17 DIAGNOSIS — I472 Ventricular tachycardia, unspecified: Secondary | ICD-10-CM

## 2022-12-17 DIAGNOSIS — E785 Hyperlipidemia, unspecified: Secondary | ICD-10-CM

## 2022-12-17 DIAGNOSIS — I480 Paroxysmal atrial fibrillation: Secondary | ICD-10-CM

## 2022-12-17 LAB — COMPREHENSIVE METABOLIC PANEL
ALT: 206 IU/L — ABNORMAL HIGH (ref 0–32)
AST: 126 IU/L — ABNORMAL HIGH (ref 0–40)
Albumin: 3.9 g/dL (ref 3.8–4.8)
Alkaline Phosphatase: 117 IU/L (ref 44–121)
BUN/Creatinine Ratio: 12 (ref 12–28)
BUN: 35 mg/dL — ABNORMAL HIGH (ref 8–27)
Bilirubin Total: 0.5 mg/dL (ref 0.0–1.2)
CO2: 22 mmol/L (ref 20–29)
Calcium: 8.8 mg/dL (ref 8.7–10.3)
Chloride: 104 mmol/L (ref 96–106)
Creatinine, Ser: 2.82 mg/dL — ABNORMAL HIGH (ref 0.57–1.00)
Globulin, Total: 2.9 g/dL (ref 1.5–4.5)
Glucose: 140 mg/dL — ABNORMAL HIGH (ref 70–99)
Potassium: 4.2 mmol/L (ref 3.5–5.2)
Sodium: 142 mmol/L (ref 134–144)
Total Protein: 6.8 g/dL (ref 6.0–8.5)
eGFR: 17 mL/min/{1.73_m2} — ABNORMAL LOW (ref >59)

## 2022-12-17 LAB — TSH

## 2022-12-17 LAB — MAGNESIUM: Magnesium: 2.2 mg/dL (ref 1.6–2.3)

## 2022-12-17 LAB — TSH RFX ON ABNORMAL TO FREE T4: TSH: 2.77 u[IU]/mL (ref 0.450–4.500)

## 2022-12-17 LAB — T4, FREE: Free T4: 1.7 ng/dL (ref 0.82–1.77)

## 2022-12-18 ENCOUNTER — Telehealth: Payer: Self-pay | Admitting: *Deleted

## 2022-12-18 NOTE — Telephone Encounter (Signed)
Lvm for patient son to call clinic back due to patient number disconnected about labs and medication questions.

## 2022-12-18 NOTE — Telephone Encounter (Signed)
-----   Message from Nurse Corky Crafts sent at 12/18/2022  3:38 PM EDT -----  ----- Message ----- From: Graciella Freer, Cordelia Poche Sent: 12/18/2022   7:29 AM EDT To: Mindy Chyrl [place], CMA; [facility] Triage  Creatine worse, Thyroid panel OK.   Can we confirm if/how she is taking lasix and losartan, and how she is feeling?   Casimiro Needle [address]" [place], PA-C  12/18/2022 7:29 AM

## 2022-12-22 ENCOUNTER — Telehealth: Payer: Self-pay

## 2022-12-22 ENCOUNTER — Ambulatory Visit: Payer: Medicare HMO | Attending: Cardiovascular Disease

## 2022-12-22 ENCOUNTER — Encounter: Payer: Self-pay | Admitting: Neurology

## 2022-12-22 ENCOUNTER — Inpatient Hospital Stay (INDEPENDENT_AMBULATORY_CARE_PROVIDER_SITE_OTHER): Payer: Medicare HMO | Admitting: Primary Care

## 2022-12-22 ENCOUNTER — Ambulatory Visit: Payer: Medicare HMO | Admitting: Neurology

## 2022-12-22 VITALS — BP 94/50 | HR 55 | Ht 63.0 in | Wt 186.0 lb

## 2022-12-22 DIAGNOSIS — I480 Paroxysmal atrial fibrillation: Secondary | ICD-10-CM

## 2022-12-22 DIAGNOSIS — I63521 Cerebral infarction due to unspecified occlusion or stenosis of right anterior cerebral artery: Secondary | ICD-10-CM | POA: Diagnosis not present

## 2022-12-22 DIAGNOSIS — I951 Orthostatic hypotension: Secondary | ICD-10-CM

## 2022-12-22 DIAGNOSIS — G40909 Epilepsy, unspecified, not intractable, without status epilepticus: Secondary | ICD-10-CM | POA: Diagnosis not present

## 2022-12-22 DIAGNOSIS — I5022 Chronic systolic (congestive) heart failure: Secondary | ICD-10-CM

## 2022-12-22 MED ORDER — LEVETIRACETAM ER 500 MG PO TB24: 500.0000 mg | ORAL_TABLET | Freq: Every evening | ORAL | 3 refills | Status: DC

## 2022-12-22 NOTE — Patient Instructions (Addendum)
With side effect of somnolence with Keppra 250 mg twice daily, will switch her to XR 500 mg nightly  Continue your other medications  Increase your fluid intake, noted to have orthostatic hypotension Please check you blood pressure at home and keep a diary Continue to follow up with PCP  Return in a year or sooner if worse

## 2022-12-22 NOTE — Telephone Encounter (Signed)
Patient will come by today to have labs done.

## 2022-12-22 NOTE — Progress Notes (Signed)
[facility]  PATIENT: [name] DOB: Aug 17, 1945  REQUESTING CLINICIAN: No ref. provider found HISTORY FROM: Patient  REASON FOR VISIT: Hospital follow up for seizure    HISTORICAL  CHIEF COMPLAINT:  Chief Complaint  Patient presents with   Seizures    Rm12, alone, Sz: last one was so long ago can't remember months ago but none since,  cva: pt stated that she feels very weak. She almost fell coming into the room today. Pt bp was very low especially upon standing. She mentioned this happens often, pt says has blurry vision, and tremors bilateral hands (noticed it yesterday)   INTERVAL HISTORY 12/22/2022:  Patient presents today for follow-up, she is alone.  Last visit was a year ago since then she has not had any seizure or seizure-like activity.  She is compliant with the Keppra 500 mg twice daily, reports side effect of somnolence with the Keppra. She is also complaining of some dizziness and tiredness.  Reported she almost fell coming out of the shower this morning.  She was hypotensive today 94/50 with a heart rate of 55 and upon standing 64/47.    HISTORY OF PRESENT ILLNESS:  This is a 77 year old woman past medical history of hypertension, hyperlipidemia, diabetes mellitus, coronary artery disease, arthritis who is presenting after being admitted to the hospital on June 10 for seizures.  Patient reported that they son called EMS because patient was not acting like her normal self.  She is not sure what was going on but son felt like she was not herself.  She remember being weak, in the hospital.  They were asking a question but she could not answer.  She did have a stroke work-up which was negative for any acute stroke but had a EEG that showed right frontal periodic discharges.  She was given Ativan and loaded on Keppra.  She was also converted to LTM.  The EEG at that time showed paretic discharges with triphasic morphology.  There is also diffuse slowing.   Patient denies any history of seizures, denies any seizure risk factor was then her right frontal stroke.  She states since leaving the hospital she is back to her normal self but then she is feeling sleepy on Keppra 500 mg twice daily.  Denies any seizure activity.  Handedness: Right handed   Onset: June 10  Seizure Type: Unclear  Current frequency:   Any injuries from seizures: None   Seizure risk factors: Granddaughter, Previous right frontal stroke   Previous ASMs: None   Currenty ASMs: Levetiracetam    ASMs side effects: Drowsiness,   Brain Images: Old right frontal infarct, no acute stroke  Previous EEGs: Periodic discharges with triphasic morphology   OTHER MEDICAL CONDITIONS: Hypertension, hyperlipidemia, diabetes mellitus, previous stroke, coronary artery disease, arthritis    REVIEW OF SYSTEMS: Full 14 system review of systems performed and negative with exception of: as noted in the HPI   ALLERGIES: Allergies  Allergen Reactions   Veltassa [Patiromer] Diarrhea   Darvon Other (See Comments)    indigestion   Mexiletine Hcl Other (See Comments)    N/V, tremors, difficulty walking, blurred vision   Promethazine Hcl Other (See Comments)    hyperactivity   Ranexa [Ranolazine Er] Diarrhea   Spironolactone Diarrhea and Nausea And Vomiting   Plavix [Clopidogrel Bisulfate] Rash    HOME MEDICATIONS: Outpatient Medications Prior to Visit  Medication Sig Dispense Refill   acetaminophen (TYLENOL) 325 MG tablet Take 325 mg by mouth every  6 (six) hours as needed for moderate pain.     amiodarone (PACERONE) 200 MG tablet Take 1 tablet (200 mg total) by mouth daily. 30 tablet 6   aspirin EC 81 MG tablet Take 1 tablet (81 mg total) by mouth daily. Swallow whole. 30 tablet 12   busPIRone (BUSPAR) 5 MG tablet Take 1 tablet (5 mg total) by mouth 2 (two) times daily. 60 tablet 1   carvedilol (COREG) 25 MG tablet Take 1 tablet (25 mg total) by mouth 2 (two) times daily with a  meal. 60 tablet 4   Cholecalciferol (VITAMIN D3) 2000 UNITS TABS Take 2,000 Units by mouth every morning.     Continuous Glucose Sensor (FREESTYLE LIBRE 3 SENSOR) MISC 1 each by Does not apply route every 14 (fourteen) days. 6 each 3   dicyclomine (BENTYL) 10 MG capsule TAKE 1 CAPSULE (10 MG TOTAL) BY MOUTH 4 (FOUR) TIMES DAILY BEFORE MEALS AND AT BEDTIME. (Patient taking differently: Take 20 mg by mouth 2 (two) times daily.) 120 capsule 1   Dulaglutide (TRULICITY) 3 MG/0.5ML SOPN Inject 3 mg into the skin once a week. (Patient taking differently: Inject 3 mg into the skin once a week. Mondays) 2 mL 11   ELIQUIS 5 MG TABS tablet TAKE 1 TABLET BY MOUTH TWICE A DAY (Patient taking differently: Take 5 mg by mouth 2 (two) times daily.) 60 tablet 11   fluticasone (FLONASE) 50 MCG/ACT nasal spray Place 1 spray into both nostrils daily as needed for allergies or rhinitis.     furosemide (LASIX) 20 MG tablet TAKE 1 TABLET BY MOUTH EVERY DAY (Patient taking differently: Take 20 mg by mouth 2 (two) times daily.) 90 tablet 1   gabapentin (NEURONTIN) 100 MG capsule Take 2 capsules (200 mg total) by mouth 2 (two) times daily. 360 capsule 3   insulin aspart (NOVOLOG FLEXPEN) 100 UNIT/ML FlexPen Inject up to 15 units daily under skin as advised (Patient taking differently: Inject 10-12 Units into the skin 3 (three) times daily. Sliding scale) 15 mL 11   insulin detemir (LEVEMIR) 100 UNIT/ML injection Inject 0.14 mLs (14 Units total) into the skin at bedtime. (Patient taking differently: Inject 12 Units into the skin at bedtime.)     loperamide (IMODIUM) 2 MG capsule Take 1 capsule (2 mg total) by mouth as needed for diarrhea or loose stools. (Patient taking differently: Take 2 mg by mouth daily.) 30 capsule 0   losartan (COZAAR) 25 MG tablet Take 1 tablet (25 mg total) by mouth daily. 30 tablet 11   magnesium oxide (MAG-OX) 400 (240 Mg) MG tablet TAKE 1 TABLET BY MOUTH TWICE A DAY (Patient taking differently: Take 400  mg by mouth 2 (two) times daily.) 180 tablet 1   mexiletine (MEXITIL) 150 MG capsule Take 1 capsule (150 mg total) by mouth every 12 (twelve) hours. 60 capsule 5   Multiple Vitamins-Minerals (EYE VITAMINS PO) Take 1 tablet by mouth 2 (two) times daily.     nitroGLYCERIN (NITROSTAT) 0.4 MG SL tablet Place 1 tablet (0.4 mg total) under the tongue every 5 (five) minutes as needed for chest pain. 25 tablet 3   omeprazole (PRILOSEC) 20 MG capsule Take 1 capsule by mouth daily.     rosuvastatin (CRESTOR) 40 MG tablet Take 1 tablet (40 mg total) by mouth daily. NEEDS FOLLOW UP APPOINTMENT FOR MORE REFILLS (Patient taking differently: Take 40 mg by mouth every evening.) 90 tablet 0   sertraline (ZOLOFT) 50 MG tablet Take 50 mg  by mouth at bedtime.     levETIRAcetam (KEPPRA) 250 MG tablet TAKE 1 TABLET BY MOUTH TWICE A DAY 180 tablet 1   No facility-administered medications prior to visit.    PAST MEDICAL HISTORY: Past Medical History:  Diagnosis Date   Allergy    Arthritis    Atrial fibrillation (HCC)    controlled with amiodarone, on coumadin   Chronic renal insufficiency    Chronic systolic heart failure (HCC)    Coronary artery disease 01/31/2011   Diabetes mellitus    type 1   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    History of chicken pox    Hyperlipidemia    Hypertension    Ischemic cardiomyopathy    Lumbar spondylosis 01/11/2012   Sleep apnea    Stroke [facility]) 2010   eye doctor said she had TIA   Ventricular tachycardia (HCC)    Polymorphic    PAST SURGICAL HISTORY: Past Surgical History:  Procedure Laterality Date   ABDOMINAL HYSTERECTOMY  1995   CARDIAC DEFIBRILLATOR PLACEMENT     CHOLECYSTECTOMY  1985   COLONOSCOPY WITH PROPOFOL N/A 02/24/2018   Procedure: COLONOSCOPY WITH PROPOFOL;  Surgeon: Rachael Fee, MD;  Location: [facility] ENDOSCOPY;  Service: Endoscopy;  Laterality: N/A;   ICD  2003/2007   implanted by Dr Alanda Amass, most recent generator change 2/13 by Dr  Johney Frame, Analyze ST study patient   ICD GENERATOR CHANGEOUT N/A 04/11/2020   Procedure: ICD GENERATOR CHANGEOUT;  Surgeon: Hillis Range, MD;  Location: [facility] INVASIVE CV LAB;  Service: Cardiovascular;  Laterality: N/A;   IMPLANTABLE CARDIOVERTER DEFIBRILLATOR (ICD) GENERATOR CHANGE N/A 05/05/2011   Procedure: ICD GENERATOR CHANGE;  Surgeon: Hillis Range, MD;  Location: [facility] CATH LAB;  Service: Cardiovascular;  Laterality: N/A;   LEFT HEART CATH AND CORONARY ANGIOGRAPHY N/A 10/14/2022   Procedure: LEFT HEART CATH AND CORONARY ANGIOGRAPHY;  Surgeon: Kathleene Hazel, MD;  Location: [facility] INVASIVE CV LAB;  Service: Cardiovascular;  Laterality: N/A;   LEFT HEART CATHETERIZATION WITH CORONARY ANGIOGRAM N/A 01/30/2011   Procedure: LEFT HEART CATHETERIZATION WITH CORONARY ANGIOGRAM;  Surgeon: Laurey Morale, MD;  Location: [facility] CATH LAB;  Service: Cardiovascular;  Laterality: N/A;   POLYPECTOMY  02/24/2018   Procedure: POLYPECTOMY;  Surgeon: Rachael Fee, MD;  Location: [facility] ENDOSCOPY;  Service: Endoscopy;;   TUBALIGATION  1980    FAMILY HISTORY: Family History  Problem Relation Age of Onset   Diabetes Mother    Heart disease Mother    Hyperlipidemia Mother    Hypertension Mother    Heart disease Father    Heart attack Father    Hypertension Father    Breast cancer Sister    Lung cancer Sister    Irritable bowel syndrome Sister    Early death Brother 42   Colon cancer Maternal Aunt    Seizures Granddaughter    Esophageal cancer Neg Hx    Colon polyps Neg Hx     SOCIAL HISTORY: Social History   Socioeconomic History   Marital status: Divorced    Spouse name: Not on file   Number of children: 3   Years of education: 12   Highest education level: Not on file  Occupational History   Occupation: Retired  Tobacco Use   Smoking status: Former    Current packs/day: 0.00    Types: Cigarettes    Quit date: 08/16/1995    Years since quitting: 27.3    Passive exposure: Never   Smokeless  tobacco: Never  Vaping  Use   Vaping status: Never Used  Substance and Sexual Activity   Alcohol use: No   Drug use: No   Sexual activity: Never    Birth control/protection: Post-menopausal  Other Topics Concern   Not on file  Social History Narrative   Regular exercise-no Caffeine Use- sometimes   Lives with family   Social Determinants of Health   Financial Resource Strain: Patient Declined (07/17/2022)   Received from [facility], [facility]   Overall Financial Resource Strain (CARDIA)    Difficulty of Paying Living Expenses: Patient declined  Food Insecurity: No Food Insecurity (12/08/2022)   Hunger Vital Sign    Worried About Running Out of Food in the Last Year: Never true    Ran Out of Food in the Last Year: Never true  Transportation Needs: No Transportation Needs (12/08/2022)   PRAPARE - Administrator, Civil Service (Medical): No    Lack of Transportation (Non-Medical): No  Recent Concern: Transportation Needs - Unmet Transportation Needs (10/12/2022)   PRAPARE - Administrator, Civil Service (Medical): Yes    Lack of Transportation (Non-Medical): No  Physical Activity: Insufficiently Active (07/16/2022)   Received from [facility], [facility]   Exercise Vital Sign    Days of Exercise per Week: 1 day    Minutes of Exercise per Session: 10 min  Stress: Stress Concern Present (07/16/2022)   Received from [facility], [facility] - Occupational Stress Questionnaire    Feeling of Stress : To some extent  Social Connections: Socially Integrated (07/16/2022)   [facility] from [facility], [facility]   Social Network    How would you rate your social network (family, work, friends)?: Good participation with social networks  Intimate Partner Violence: Not At Risk (12/08/2022)   Humiliation, Afraid, Rape, and Kick questionnaire    Fear of Current or Ex-Partner: No    Emotionally Abused: No     Physically Abused: No    Sexually Abused: No    PHYSICAL EXAM  GENERAL EXAM/CONSTITUTIONAL: Vitals:  Vitals:   12/22/22 1532  BP: (!) 94/50  Pulse: (!) 55  Weight: 186 lb (84.4 kg)  Height: 5\' 3"  (1.6 m)     BP 65/47 Standing  Body mass index is 32.95 kg/m. Wt Readings from Last 3 Encounters:  12/22/22 186 lb (84.4 kg)  12/09/22 188 lb 11.4 oz (85.6 kg)  12/07/22 186 lb 3.2 oz (84.5 kg)   Patient is in no distress; well developed, nourished and groomed; neck is supple  MUSCULOSKELETAL: Gait, strength, tone, movements noted in Neurologic exam below  NEUROLOGIC: MENTAL STATUS:      No data to display         awake, alert, oriented to person, place and time recent and remote memory intact normal attention and concentration language fluent, comprehension intact, naming intact fund of knowledge appropriate  CRANIAL NERVE:  2nd, 3rd, 4th, 6th - pupils equal and reactive to light, visual fields full to confrontation, extraocular muscles intact, no nystagmus 5th - facial sensation symmetric 7th - facial strength symmetric 8th - hearing intact 9th - palate elevates symmetrically, uvula midline 11th - shoulder shrug symmetric 12th - tongue protrusion midline  MOTOR:  normal bulk and tone, full strength in the BUE, BLE  SENSORY:  normal and symmetric to light touch  COORDINATION:  finger-nose-finger, fine finger movements normal  GAIT/STATION:  Uses a walker     DIAGNOSTIC DATA (LABS,  IMAGING, TESTING) - I reviewed patient records, labs, notes, testing and imaging myself where available.  Lab Results  Component Value Date   WBC 6.1 12/07/2022   HGB 11.9 (L) 12/07/2022   HCT 39.2 12/07/2022   MCV 93.1 12/07/2022   PLT 227 12/07/2022      Component Value Date/Time   NA 142 12/17/2022 1138   K 4.2 12/17/2022 1138   CL 104 12/17/2022 1138   CO2 22 12/17/2022 1138   GLUCOSE 140 (H) 12/17/2022 1138   GLUCOSE 205 (H) 12/09/2022 0412   BUN 35 (H)  12/17/2022 1138   CREATININE 2.82 (H) 12/17/2022 1138   CREATININE 1.76 (H) 05/06/2015 1247   CALCIUM 8.8 12/17/2022 1138   PROT 6.8 12/17/2022 1138   ALBUMIN 3.9 12/17/2022 1138   AST 126 (H) 12/17/2022 1138   ALT 206 (H) 12/17/2022 1138   ALKPHOS 117 12/17/2022 1138   BILITOT 0.5 12/17/2022 1138   GFRNONAA 24 (L) 12/09/2022 0412   GFRAA 42 (L) 04/01/2020 1204   Lab Results  Component Value Date   CHOL 125 12/18/2021   HDL 50 12/18/2021   LDLCALC 66 12/18/2021   TRIG 44 12/18/2021   Lab Results  Component Value Date   HGBA1C 9.2 (H) 10/02/2022   No results found for: "VITAMINB12" Lab Results  Component Value Date   TSH 2.770 12/17/2022   TSH CANCELED 12/17/2022   Head CT 08/30/21 Old right frontal infarct, stable. No acute intracranial abnormality.  EEG 08/31/2021 - Periodic discharges with triphasic morphology, generalized and lateralized right hemisphere ( GPDs) - Continuous slow, generalized   IMPRESSION: This study showed periodic discharges at 1.5-2Hz  which are on the ictal-interictal continuum. The frequency and morphology is more commonly indicative of toxic-metabolic causes. However, the predominance in right hemipshere In the setting of underlying chronic infarct could be suggestive of potential epileptogenicity. Consider ativan challenge and prolonged monitoring for further characterization.    Additionally, this study is suggestive of moderate diffuse encephalopathy, nonspecific etiology. No definite seizures were seen throughout the recording.  I personally reviewed brain Images and previous EEG reports.   ASSESSMENT AND PLAN  77 y.o. year old female  with vascular risk factors including hypertension, hyperlipidemia, CAD, diabetes mellitus who is presenting for follow-up for her seizure.  No seizure or seizure activity since last visit a year ago.  She is compliant with her Keppra but reports side effect of sleepiness, will switch the Keppra to extended  formulation 500 mg nightly.  In term of her blood pressure today, she was found to have orthostatic hypotension.  I have encouraged patient to increase her fluid intake.  I also advised her to keep a diary of her blood pressure and to give to her provider.  Continue to follow with PCP and return in 1 year or sooner if worse.   1. Orthostatic hypotension   2. Seizure disorder (HCC)   3. Cerebrovascular accident (CVA) due to occlusion of right anterior cerebral artery [facility])      Patient Instructions  With side effect of somnolence with Keppra 250 mg twice daily, will switch her to XR 500 mg nightly  Continue your other medications  Increase your fluid intake, noted to have orthostatic hypotension Please check you blood pressure at home and keep a diary Continue to follow up with PCP  Return in a year or sooner if worse    Per [facility] statutes, patients with seizures are not allowed to drive until they have been seizure-free  for six months.  Other recommendations include using caution when using heavy equipment or power tools. Avoid working on ladders or at heights. Take showers instead of baths.  Do not swim alone.  Ensure the water temperature is not too high on the home water heater. Do not go swimming alone. Do not lock yourself in a room alone (i.e. bathroom). When caring for infants or small children, sit down when holding, feeding, or changing them to minimize risk of injury to the child in the event you have a seizure. Maintain good sleep hygiene. Avoid alcohol.  Also recommend adequate sleep, hydration, good diet and minimize stress.   During the Seizure  - First, ensure adequate ventilation and place patients on the floor on their left side  Loosen clothing around the neck and ensure the airway is patent. If the patient is clenching the teeth, do not force the mouth open with any object as this can cause severe damage - Remove all items from the surrounding that can be  hazardous. The patient may be oblivious to what's happening and may not even know what he or she is doing. If the patient is confused and wandering, either gently guide him/her away and block access to outside areas - Reassure the individual and be comforting - Call 911. In most cases, the seizure ends before EMS arrives. However, there are cases when seizures may last over 3 to 5 minutes. Or the individual may have developed breathing difficulties or severe injuries. If a pregnant patient or a person with diabetes develops a seizure, it is prudent to call an ambulance. - Finally, if the patient does not regain full consciousness, then call EMS. Most patients will remain confused for about 45 to 90 minutes after a seizure, so you must use judgment in calling for help. - Avoid restraints but make sure the patient is in a bed with padded side rails - Place the individual in a lateral position with the neck slightly flexed; this will help the saliva drain from the mouth and prevent the tongue from falling backward - Remove all nearby furniture and other hazards from the area - Provide verbal assurance as the individual is regaining consciousness - Provide the patient with privacy if possible - Call for help and start treatment as ordered by the caregiver   After the Seizure (Postictal Stage)  After a seizure, most patients experience confusion, fatigue, muscle pain and/or a headache. Thus, one should permit the individual to sleep. For the next few days, reassurance is essential. Being calm and helping reorient the person is also of importance.  Most seizures are painless and end spontaneously. Seizures are not harmful to others but can lead to complications such as stress on the lungs, brain and the heart. Individuals with prior lung problems may develop labored breathing and respiratory distress.     No orders of the defined types were placed in this encounter.   Meds ordered this encounter   Medications   levETIRAcetam (KEPPRA XR) 500 MG 24 hr tablet    Sig: Take 1 tablet (500 mg total) by mouth at bedtime.    Dispense:  90 tablet    Refill:  3    Return in about 1 year (around 12/22/2023).    Windell Norfolk, MD 12/22/2022, 4:16 PM  [facility] [address], [address] [place], [place] [address] [phone]

## 2022-12-22 NOTE — Telephone Encounter (Signed)
-----   Message from Mariam Dollar Tillery sent at 12/21/2022  9:06 PM EDT ----- Can we please get a recheck BMET this week to make sure her Cr has trended back down?

## 2022-12-23 LAB — BASIC METABOLIC PANEL
BUN/Creatinine Ratio: 14 (ref 12–28)
BUN: 43 mg/dL — ABNORMAL HIGH (ref 8–27)
CO2: 23 mmol/L (ref 20–29)
Calcium: 8.7 mg/dL (ref 8.7–10.3)
Chloride: 104 mmol/L (ref 96–106)
Creatinine, Ser: 3.1 mg/dL — ABNORMAL HIGH (ref 0.57–1.00)
Glucose: 229 mg/dL — ABNORMAL HIGH (ref 70–99)
Potassium: 5 mmol/L (ref 3.5–5.2)
Sodium: 144 mmol/L (ref 134–144)
eGFR: 15 mL/min/{1.73_m2} — ABNORMAL LOW (ref >59)

## 2022-12-25 ENCOUNTER — Ambulatory Visit: Payer: Medicare HMO

## 2022-12-28 ENCOUNTER — Other Ambulatory Visit: Payer: Medicare HMO

## 2022-12-30 ENCOUNTER — Ambulatory Visit: Payer: Medicare HMO | Attending: Physician Assistant

## 2023-01-03 ENCOUNTER — Other Ambulatory Visit: Payer: Self-pay | Admitting: Internal Medicine

## 2023-01-03 ENCOUNTER — Other Ambulatory Visit (HOSPITAL_COMMUNITY): Payer: Self-pay | Admitting: Cardiology

## 2023-01-05 ENCOUNTER — Ambulatory Visit: Payer: Medicare HMO

## 2023-01-08 ENCOUNTER — Other Ambulatory Visit: Payer: Self-pay

## 2023-01-08 ENCOUNTER — Ambulatory Visit: Payer: Medicare HMO | Attending: Physician Assistant

## 2023-01-08 DIAGNOSIS — I2510 Atherosclerotic heart disease of native coronary artery without angina pectoris: Secondary | ICD-10-CM

## 2023-01-08 DIAGNOSIS — I5022 Chronic systolic (congestive) heart failure: Secondary | ICD-10-CM

## 2023-01-08 NOTE — Progress Notes (Signed)
Placed order for BMET.

## 2023-01-09 LAB — BASIC METABOLIC PANEL
BUN/Creatinine Ratio: 14 (ref 12–28)
BUN: 26 mg/dL (ref 8–27)
CO2: 19 mmol/L — ABNORMAL LOW (ref 20–29)
Calcium: 8.8 mg/dL (ref 8.7–10.3)
Chloride: 107 mmol/L — ABNORMAL HIGH (ref 96–106)
Creatinine, Ser: 1.88 mg/dL — ABNORMAL HIGH (ref 0.57–1.00)
Glucose: 111 mg/dL — ABNORMAL HIGH (ref 70–99)
Potassium: 4.5 mmol/L (ref 3.5–5.2)
Sodium: 147 mmol/L — ABNORMAL HIGH (ref 134–144)
eGFR: 28 mL/min/{1.73_m2} — ABNORMAL LOW (ref >59)

## 2023-01-13 ENCOUNTER — Ambulatory Visit (HOSPITAL_COMMUNITY)
Admission: RE | Admit: 2023-01-13 | Discharge: 2023-01-13 | Disposition: A | Discharge status: Home / Self Care | Payer: Medicare HMO | Source: Ambulatory Visit | Attending: Cardiology | Admitting: Cardiology

## 2023-01-13 ENCOUNTER — Ambulatory Visit (INDEPENDENT_AMBULATORY_CARE_PROVIDER_SITE_OTHER): Payer: Medicare HMO

## 2023-01-13 ENCOUNTER — Other Ambulatory Visit (HOSPITAL_COMMUNITY): Payer: Self-pay

## 2023-01-13 ENCOUNTER — Encounter (HOSPITAL_COMMUNITY): Payer: Self-pay | Admitting: Cardiology

## 2023-01-13 ENCOUNTER — Ambulatory Visit (HOSPITAL_BASED_OUTPATIENT_CLINIC_OR_DEPARTMENT_OTHER)
Admission: RE | Admit: 2023-01-13 | Discharge: 2023-01-13 | Disposition: A | Discharge status: Home / Self Care | Payer: Medicare HMO | Source: Ambulatory Visit | Attending: Family Medicine | Admitting: Family Medicine

## 2023-01-13 ENCOUNTER — Telehealth (HOSPITAL_COMMUNITY): Payer: Self-pay

## 2023-01-13 VITALS — BP 120/60 | HR 55 | Wt 184.6 lb

## 2023-01-13 DIAGNOSIS — E785 Hyperlipidemia, unspecified: Secondary | ICD-10-CM | POA: Insufficient documentation

## 2023-01-13 DIAGNOSIS — R9431 Abnormal electrocardiogram [ECG] [EKG]: Secondary | ICD-10-CM | POA: Insufficient documentation

## 2023-01-13 DIAGNOSIS — I2510 Atherosclerotic heart disease of native coronary artery without angina pectoris: Secondary | ICD-10-CM | POA: Diagnosis present

## 2023-01-13 DIAGNOSIS — Z8673 Personal history of transient ischemic attack (TIA), and cerebral infarction without residual deficits: Secondary | ICD-10-CM | POA: Insufficient documentation

## 2023-01-13 DIAGNOSIS — I255 Ischemic cardiomyopathy: Secondary | ICD-10-CM | POA: Insufficient documentation

## 2023-01-13 DIAGNOSIS — Z95810 Presence of automatic (implantable) cardiac defibrillator: Secondary | ICD-10-CM | POA: Insufficient documentation

## 2023-01-13 DIAGNOSIS — I5022 Chronic systolic (congestive) heart failure: Secondary | ICD-10-CM

## 2023-01-13 DIAGNOSIS — I480 Paroxysmal atrial fibrillation: Secondary | ICD-10-CM | POA: Diagnosis not present

## 2023-01-13 DIAGNOSIS — I4891 Unspecified atrial fibrillation: Secondary | ICD-10-CM | POA: Diagnosis present

## 2023-01-13 DIAGNOSIS — E039 Hypothyroidism, unspecified: Secondary | ICD-10-CM | POA: Diagnosis not present

## 2023-01-13 DIAGNOSIS — E1122 Type 2 diabetes mellitus with diabetic chronic kidney disease: Secondary | ICD-10-CM | POA: Insufficient documentation

## 2023-01-13 DIAGNOSIS — I130 Hypertensive heart and chronic kidney disease with heart failure and stage 1 through stage 4 chronic kidney disease, or unspecified chronic kidney disease: Secondary | ICD-10-CM | POA: Diagnosis not present

## 2023-01-13 DIAGNOSIS — N183 Chronic kidney disease, stage 3 unspecified: Secondary | ICD-10-CM | POA: Insufficient documentation

## 2023-01-13 DIAGNOSIS — Z7901 Long term (current) use of anticoagulants: Secondary | ICD-10-CM | POA: Diagnosis not present

## 2023-01-13 DIAGNOSIS — R7989 Other specified abnormal findings of blood chemistry: Secondary | ICD-10-CM | POA: Diagnosis not present

## 2023-01-13 DIAGNOSIS — Z955 Presence of coronary angioplasty implant and graft: Secondary | ICD-10-CM | POA: Diagnosis not present

## 2023-01-13 DIAGNOSIS — I472 Ventricular tachycardia, unspecified: Secondary | ICD-10-CM | POA: Insufficient documentation

## 2023-01-13 DIAGNOSIS — G4730 Sleep apnea, unspecified: Secondary | ICD-10-CM | POA: Diagnosis not present

## 2023-01-13 LAB — CUP PACEART REMOTE DEVICE CHECK
Battery Remaining Longevity: 64 mo
Battery Remaining Percentage: 69 %
Battery Voltage: 2.99 V
Brady Statistic AP VP Percent: 2 %
Brady Statistic AP VS Percent: 96 %
Brady Statistic AS VP Percent: 1 %
Brady Statistic AS VS Percent: 1.8 %
Brady Statistic RA Percent Paced: 98 %
Brady Statistic RV Percent Paced: 2.1 %
Date Time Interrogation Session: 20241023041947
HighPow Impedance: 46 Ohm
HighPow Impedance: 46 Ohm
Implantable Lead Connection Status: 753985
Implantable Lead Connection Status: 753985
Implantable Lead Implant Date: 20070117
Implantable Lead Implant Date: 20070117
Implantable Lead Location: 753859
Implantable Lead Location: 753860
Implantable Lead Model: 7040
Implantable Pulse Generator Implant Date: 20220120
Lead Channel Impedance Value: 430 Ohm
Lead Channel Impedance Value: 450 Ohm
Lead Channel Pacing Threshold Amplitude: 0.75 V
Lead Channel Pacing Threshold Amplitude: 0.75 V
Lead Channel Pacing Threshold Pulse Width: 0.5 ms
Lead Channel Pacing Threshold Pulse Width: 0.5 ms
Lead Channel Sensing Intrinsic Amplitude: 4.7 mV
Lead Channel Sensing Intrinsic Amplitude: 8.4 mV
Lead Channel Setting Pacing Amplitude: 2.5 V
Lead Channel Setting Pacing Amplitude: 2.5 V
Lead Channel Setting Pacing Pulse Width: 0.5 ms
Lead Channel Setting Sensing Sensitivity: 0.5 mV
Pulse Gen Serial Number: 9976817
Zone Setting Status: 755011

## 2023-01-13 LAB — COMPREHENSIVE METABOLIC PANEL
ALT: 118 U/L — ABNORMAL HIGH (ref 0–44)
AST: 149 U/L — ABNORMAL HIGH (ref 15–41)
Albumin: 3.1 g/dL — ABNORMAL LOW (ref 3.5–5.0)
Alkaline Phosphatase: 91 U/L (ref 38–126)
Anion gap: 10 (ref 5–15)
BUN: 28 mg/dL — ABNORMAL HIGH (ref 8–23)
CO2: 25 mmol/L (ref 22–32)
Calcium: 8.7 mg/dL — ABNORMAL LOW (ref 8.9–10.3)
Chloride: 107 mmol/L (ref 98–111)
Creatinine, Ser: 2.05 mg/dL — ABNORMAL HIGH (ref 0.44–1.00)
GFR, Estimated: 25 mL/min — ABNORMAL LOW (ref >60)
Glucose, Bld: 167 mg/dL — ABNORMAL HIGH (ref 70–99)
Potassium: 4.5 mmol/L (ref 3.5–5.1)
Sodium: 142 mmol/L (ref 135–145)
Total Bilirubin: 0.5 mg/dL (ref 0.3–1.2)
Total Protein: 6.7 g/dL (ref 6.5–8.1)

## 2023-01-13 LAB — LIPID PANEL
Cholesterol: 127 mg/dL (ref 0–200)
HDL: 49 mg/dL (ref >40)
LDL Cholesterol: 65 mg/dL (ref 0–99)
Total CHOL/HDL Ratio: 2.6 {ratio}
Triglycerides: 63 mg/dL (ref <150)
VLDL: 13 mg/dL (ref 0–40)

## 2023-01-13 LAB — TSH: TSH: 6.459 u[IU]/mL — ABNORMAL HIGH (ref 0.350–4.500)

## 2023-01-13 LAB — BRAIN NATRIURETIC PEPTIDE: B Natriuretic Peptide: 454.3 pg/mL — ABNORMAL HIGH (ref 0.0–100.0)

## 2023-01-13 LAB — ECHOCARDIOGRAM COMPLETE
AV Vena cont: 0.4 cm
Area-P 1/2: 2.11 cm2
Calc EF: 32.9 %
P 1/2 time: 522 ms
S' Lateral: 4.4 cm
Single Plane A2C EF: 36.9 %
Single Plane A4C EF: 30.8 %

## 2023-01-13 MED ORDER — FUROSEMIDE 20 MG PO TABS: ORAL_TABLET | ORAL | 2 refills | Status: DC

## 2023-01-13 MED ORDER — FARXIGA 10 MG PO TABS: 10.0000 mg | ORAL_TABLET | Freq: Every day | ORAL | 3 refills | Status: DC

## 2023-01-13 NOTE — Patient Instructions (Addendum)
CHANGE Lasix to 40 mg in the morning and 20 mg in the evening.  START Farxiga 10 mg daily  Labs done today, your results will be available in MyChart, we will contact you for abnormal readings.  Repeat blood work in 10 days.  PLEASE make an appointment to see you eye doctor soon  Your physician recommends that you schedule a follow-up appointment in: 1 month  If you have any questions or concerns before your next appointment please send [name] a message through [place] or call our office at [phone].    TO LEAVE A MESSAGE FOR THE NURSE SELECT OPTION 2, PLEASE LEAVE A MESSAGE INCLUDING: YOUR NAME DATE OF BIRTH CALL BACK NUMBER REASON FOR CALL**this is important as we prioritize the call backs  YOU WILL RECEIVE A CALL BACK THE SAME DAY AS LONG AS YOU CALL BEFORE 4:00 PM  At the [facility], you and your health needs are our priority. As part of our continuing mission to provide you with exceptional heart care, we have created designated Provider Care Teams. These Care Teams include your primary Cardiologist (physician) and Advanced Practice Providers (APPs- Physician Assistants and Nurse Practitioners) who all work together to provide you with the care you need, when you need it.   You may see any of the following providers on your designated Care Team at your next follow up: Dr Arvilla Meres Dr [name] Dr. Dorthula Nettles Dr. Clearnce Hasten Amy Filbert Schilder, NP Robbie Lis, [place] [facility] [place], [place] Brynda Peon, NP [place] Lee, NP Karle Plumber, PharmD   Please be sure to bring in all your medications bottles to every appointment.    Thank you for choosing [facility]

## 2023-01-13 NOTE — Progress Notes (Signed)
ID:  [name], DOB November 07, 1945, MRN [id number]  Provider location: [place] [facility] Type of Visit: Established patient   PCP:  Excell Seltzer, MD  HF Cardiologist:  Dr. Shirlee Latch   HPI: [name] is a 77 y.o. female who has a history of CAD, ischemic cardiomyopathy, and atrial fibrillation.  Patient was hospitalized in 11/12 at [facility] for VT with ICD discharge.  She had a left heart cath showing patent stents.  EF was 35-40% by echo.  She is on amiodarone.  She has had periodic problems with creatinine rising with medication adjustments.  Echo in 12/18 showed EF 35-40%, inferior and septal hypokinesis, mild MR.    Echo in 5/20 showed EF 40%, normal RV, mild AI.   She did not tolerate eplerenone due to nausea.  Echo in 4/21 showed EF 40-45% with inferior/inferoseptal HK, normal RV.   In 5/22, she had COVID-19 infection.    Echo 10/22 EF 40% with basal-mid inferior and inferoseptal severe hypokinesis, normal RV, mild AI.   Notified by [facility] 04/24/21 that she had VT, with shock.    In 6/23, she was admitted with altered mental status.  CT showed old right frontal infarct.  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  Echo in 6/23 showed EF 40%, mild LVH, normal IVC.   Follow up 9/23, NYHA II and volume stable. Entresto increased to 97/103.  Admitted 3/24 with VT storm, also noted to have AKI and hyperkalemia. Given Lokelma and GDMT held. She was cardioverted in ED. She was loaded with IV amiodarone, VT zones were changed. Echo showed EF 25-30%. Continued on amiodarone 200 mg bid and discharged home.  Patient was admitted again with VT storm in 7/24.  LHC showed 99% distal LCx stenosis, too small for PCI (medically managed).    Recurrent VT in 9/24, mexiletine was started.   Echo was done today and reviewed, EF 35-40%, mild RV dysfunction, mild MR.   Today she returns for HF follow up.  She has had trouble with mexiletine; she thinks it causes blurry visio, tremors,  and balance difficult.  This improved when she decreased it to 150 mg bid but symptoms are still present,.  She did not have any of these symptoms on amiodarone alone.  She is unable to tolerate ranolazine due to diarrhea.  She is short of breath walking around her house.  She uses a walker for balance.  No chest pain.  She does note orthopnea, sleeps on 2-3 pillows chronically.    St Jude device interrogation (personally reviewed): 98% a-pacing, minimal V-pacing, decreased thoracic impedance recently, no VT.    ECG (personally reviewed): a-paced, LVH  REDS clip 38%  Labs (1/19): LDL 71, HDL 58, K 4.8, creatinine 8.46, LFTs normal, hgb 12.9.  Labs (2/19): K 4.9, creatinine 1.35  Labs (6/19): K 4.4, creatinine 1.57 Labs (8/19): K 5, creatinine 1.59, LFTs normal Labs (2/20): K 4.4, creatinine 1.4, LDL 72, HDL 55, normal LFTs Labs (9/62): LDL 81, K 4.8, creatinine 9.52 Labs (10/20): hgb 13.6, K 4.2, creatinine 1.62, LFTs normal, LDL 81, HDL 50 Labs (12/20): LDL 88 Labs (3/21): K 4, creatinine 1.56 Labs (5/22): K 4, creatinine 1.64 Labs (7/22): LDL 76 Labs (10/22): K 4.3, creatinine 1.40, LFTs normal, TSH low (Levoxyl stopped). Labs (6/23): LDL 81, K 4.3, creatinine 8.41 Labs (3/24): K 3.9, creatinine 1.91, LFTs normal, TSH normal Labs (4/24): K 4.1, creatinine 1.38 Labs (9/24): AST 126, ALT 206, TSH normal Labs (10/24): K 4.5,  creatinine 1.88  PMH: 1. Diabetes mellitus 2. CVA, TIA in 2010.   3. HTN 4. CKD stage 3 5. H/o TAH 6. H/o CCY 7. Sciatica 8. Atrial fibrillation 9. CAD: s/p LAD and RCA PCI.  Last LHC in 11/12 with patent proximal LAD stent, ostial 70% D1 (jailed by stent), mild LAD stent patent, patent RCA stents, EF 40% with global hypokinesis.  - LHC (7/24): 99% dLCx stenosis, too small for PCI. Medically managed.  10. Ischemic cardiomyopathy: Echo (10/12): EF 35-40%, moderate focal basal septal hypertrophy, inferior akinesis, grade I diastolic dysfunction, moderate aortic  insufficiency. St Jude dual chamber ICD.  Spironolactone stopped when creatinine rose to 2.5.  Echo (5/14) with EF 40-45%, mild AI.  Echo (6/16) with EF 40-45%, inferior/inferoseptal hypokinesis, mild LVH.  - Echo (12/18): EF 35-40%, inferior and septal hypokinesis, mild MR, mild AI.  - Echo (5/20): EF 40%, inferior and inferoseptal hypokinesis, normal RV size and systolic function, mild AI, normal IVC.  - Unable to tolerate eplerenone.  - Echo (4/21): EF 40-45%, inferior/inferoseptal HK, normal RV, mild AI.  - Echo (10/22): EF 40% with basal-mid inferior and inferoseptal severe hypokinesis, normal RV, mild AI.  - Echo (6/23): EF 40%, mild LVH, IVC normal - Echo (3/24): EF 25-30%, grade I DD, RV normal, moderate MR - Echo (10/24): EF 35-40%, mild RV dysfunction, mild MR. 11. Aortic insufficiency: moderate in past but mild on most recent echo.   12. History of VT: 3/24, 7/24, 9/24.  13. Gastroparesis 14. Hypothyroidism 15. COVID-19 5/22 16. Depression 17. Seizure disorder: Suspected to be related to prior CVA.  18. CTA head/neck (6/23): No significant carotid disease.   SH: Divorced.  3 children.  Quit smoking in 1997. Lives in [place] now with daughter.  [place].   FH: CAD  ROS: All systems reviewed and negative except as per HPI.   Current Outpatient Medications  Medication Sig Dispense Refill   acetaminophen (TYLENOL) 325 MG tablet Take 325 mg by mouth every 6 (six) hours as needed for moderate pain.     amiodarone (PACERONE) 200 MG tablet Take 1 tablet (200 mg total) by mouth daily. 30 tablet 6   aspirin EC 81 MG tablet Take 1 tablet (81 mg total) by mouth daily. Swallow whole. 30 tablet 12   busPIRone (BUSPAR) 5 MG tablet Take 1 tablet (5 mg total) by mouth 2 (two) times daily. 60 tablet 1   carvedilol (COREG) 25 MG tablet Take 1 tablet (25 mg total) by mouth 2 (two) times daily with a meal. 60 tablet 4   Cholecalciferol (VITAMIN D3) 2000 UNITS TABS Take 2,000 Units by  mouth every morning.     Continuous Glucose Sensor (FREESTYLE LIBRE 3 SENSOR) MISC 1 each by Does not apply route every 14 (fourteen) days. 6 each 3   dicyclomine (BENTYL) 10 MG capsule TAKE 1 CAPSULE (10 MG TOTAL) BY MOUTH 4 (FOUR) TIMES DAILY BEFORE MEALS AND AT BEDTIME. 120 capsule 1   Dulaglutide (TRULICITY) 3 MG/0.5ML SOPN Inject 3 mg into the skin once a week. 2 mL 11   ELIQUIS 5 MG TABS tablet TAKE 1 TABLET BY MOUTH TWICE A DAY 60 tablet 11   FARXIGA 10 MG TABS tablet Take 1 tablet (10 mg total) by mouth daily before breakfast. 90 tablet 3   fluticasone (FLONASE) 50 MCG/ACT nasal spray Place 1 spray into both nostrils daily as needed for allergies or rhinitis.     gabapentin (NEURONTIN) 100 MG capsule Take 2 capsules (  200 mg total) by mouth 2 (two) times daily. 360 capsule 3   insulin aspart (NOVOLOG FLEXPEN) 100 UNIT/ML FlexPen Inject up to 15 units daily under skin as advised 15 mL 11   insulin detemir (LEVEMIR) 100 UNIT/ML injection Inject 0.14 mLs (14 Units total) into the skin at bedtime.     levETIRAcetam (KEPPRA XR) 500 MG 24 hr tablet Take 1 tablet (500 mg total) by mouth at bedtime. 90 tablet 3   loperamide (IMODIUM) 2 MG capsule Take 1 capsule (2 mg total) by mouth as needed for diarrhea or loose stools. 30 capsule 0   magnesium oxide (MAG-OX) 400 (240 Mg) MG tablet TAKE 1 TABLET BY MOUTH TWICE A DAY 180 tablet 1   mexiletine (MEXITIL) 150 MG capsule Take 1 capsule (150 mg total) by mouth every 12 (twelve) hours. 60 capsule 5   Multiple Vitamins-Minerals (EYE VITAMINS PO) Take 1 tablet by mouth 2 (two) times daily.     nitroGLYCERIN (NITROSTAT) 0.4 MG SL tablet Place 1 tablet (0.4 mg total) under the tongue every 5 (five) minutes as needed for chest pain. 25 tablet 3   omeprazole (PRILOSEC) 20 MG capsule Take 1 capsule by mouth daily.     rosuvastatin (CRESTOR) 40 MG tablet Take 1 tablet (40 mg total) by mouth daily. NEEDS FOLLOW UP APPOINTMENT FOR MORE REFILLS 90 tablet 0    sertraline (ZOLOFT) 50 MG tablet Take 50 mg by mouth at bedtime.     furosemide (LASIX) 20 MG tablet Take 2 tablets (40 mg total) by mouth every morning AND 1 tablet (20 mg total) every evening. 90 tablet 2   No current facility-administered medications for this encounter.   Wt Readings from Last 3 Encounters:  01/13/23 83.7 kg (184 lb 9.6 oz)  12/22/22 84.4 kg (186 lb)  12/09/22 85.6 kg (188 lb 11.4 oz)   BP 120/60   Pulse (!) 55   Wt 83.7 kg (184 lb 9.6 oz)   SpO2 93%   BMI 32.70 kg/m   Physical Exam General: NAD Neck: JVP 8-9 cm, no thyromegaly or thyroid nodule.  Lungs: Clear to auscultation bilaterally with normal respiratory effort. CV: Nondisplaced PMI.  Heart regular S1/S2, no S3/S4, 1/6 SEM RUSB.  No peripheral edema.  No carotid bruit.  Normal pedal pulses.  Abdomen: Soft, nontender, no hepatosplenomegaly, no distention.  Skin: Intact without lesions or rashes.  Neurologic: Alert and oriented x 3.  Psych: Normal affect. Extremities: No clubbing or cyanosis.  HEENT: Normal.   Assessment/Plan 1. Atrial fibrillation: Paroxysmal.  NSR today.  - Continue amiodarone.  Check TSH and LFTs today, LFTs were elevated when last checked. She will need regular eye exams.  - Continue Eliquis 5 mg bid.  2. Coronary artery disease: 99% dLCx on 7/24 cath, small vessel and not target for PCI.  Medically managed, no chest pain.  - Continue statin, check lipids today.  - Can stop ASA given Eliquis use.   3. Ventricular tachycardia: Several admissions for VT storm.  She is currently on amiodarone 200 mg daily and mexiletine 150 mg bid.  - Continue amiodarone 200 mg daily, see above regarding TSH and LFTs.  - She has tolerated mexiletine poorly, still with side effects even after decreasing the dose.  For now, will continue but would like her to see Dr. Elberta Fortis soon. I question whether she would be a VT ablation candidate. This potentially could allow her to stop one or both of the  anti-arrhythmics.  - She did  not tolerate ranolazine due to diarrhea.  4. Chronic systolic CHF: Ischemic cardiomyopathy.  EF 40% on 6/23 echo. She has a [employer] ICD. Echo in 3/24 showed EF 25-30%, no RWMAs. Echo today showed EF 35-40%, mild RV dysfunction, mild MR.  NYHA class III, worse recently.  She is volume overloaded by exam, REDS clip, and Corvue.  GDMT limited CKD stage 3.  - Increase Lasix to 40 qam/20 qpm. BMET/BNP today and BMET in 10 days.  - Continue Coreg 25 mg bid.  - Restart Jardiance 10 mg daily.   - Entresto stopped in the past due to AKI/hyperkalemia.  May be able to start low dose losartan next.  - She has tolerated neither spironolactone nor eplerenone.  5. CKD stage 3: BMET today.  - Restart Jardiance.  6. Hypothyroidism: Managed by PCP. Now off levothyroxine. 7. Hyperlipidemia: On Crestor, check lipids today.  8. Elevated LFTs: Noted on 9/24 labs.  Concerned that this could be due to amiodarone.  - Repeat LFTs today.   Followup in 1 month with APP.   Signed, [name], MD  01/13/2023  [facility] [place] [address] [facility] [place] [address] [phone] (office) [phone] (fax)

## 2023-01-13 NOTE — Telephone Encounter (Signed)
Advanced Heart Failure Patient Advocate Encounter  The patient was approved for a [employer] grant that will help cover the cost of Carvedilol, Farxiga.  Total amount awarded, $10,000.  Effective: 12/14/2022 - 12/13/2023.  BIN [id number] PCN [id number] Group [id number] ID [id number]  Pharmacy provided with approval and processing information. Patient informed in office.  Burnell Blanks, CPhT Rx Patient Advocate Phone: [phone]

## 2023-01-13 NOTE — Progress Notes (Signed)
ReDS Vest / Clip - 01/13/23 1000       ReDS Vest / Clip   Station Marker A    Ruler Value 30.5    ReDS Value Range Moderate volume overload    ReDS Actual Value 38

## 2023-01-13 NOTE — Addendum Note (Signed)
Encounter addended by: Laurey Morale, MD on: 01/13/2023 11:11 PM  Actions taken: Level of Service modified

## 2023-01-14 ENCOUNTER — Telehealth (HOSPITAL_COMMUNITY): Payer: Self-pay

## 2023-01-14 DIAGNOSIS — I5022 Chronic systolic (congestive) heart failure: Secondary | ICD-10-CM

## 2023-01-14 NOTE — Telephone Encounter (Signed)
-----   Message from [name] sent at 01/13/2023  4:43 PM EDT ----- LFTs remain elevated.  I worry that this could be from her amiodarone.  I would like her to get a RUQ ultrasound for liver evaluation and please send HCV antibody, HBV panel (HBsAb, HBsAg), HAV antibody.

## 2023-01-15 ENCOUNTER — Other Ambulatory Visit: Payer: Self-pay | Admitting: Internal Medicine

## 2023-01-18 ENCOUNTER — Other Ambulatory Visit: Payer: Self-pay | Admitting: Internal Medicine

## 2023-01-21 ENCOUNTER — Other Ambulatory Visit: Payer: Self-pay | Admitting: Internal Medicine

## 2023-01-21 MED ORDER — TRESIBA FLEXTOUCH 100 UNIT/ML ~~LOC~~ SOPN: 12.0000 [IU] | PEN_INJECTOR | Freq: Every day | SUBCUTANEOUS | 3 refills | Status: DC

## 2023-01-26 ENCOUNTER — Ambulatory Visit: Payer: Medicare HMO | Admitting: Cardiology

## 2023-01-27 ENCOUNTER — Other Ambulatory Visit: Payer: Self-pay | Admitting: Internal Medicine

## 2023-01-27 ENCOUNTER — Other Ambulatory Visit (HOSPITAL_COMMUNITY): Payer: Medicare HMO

## 2023-01-29 ENCOUNTER — Encounter: Payer: Medicare HMO | Admitting: Cardiology

## 2023-02-01 ENCOUNTER — Other Ambulatory Visit: Payer: Self-pay | Admitting: Internal Medicine

## 2023-02-01 NOTE — Progress Notes (Signed)
Remote ICD transmission.   

## 2023-02-04 ENCOUNTER — Ambulatory Visit: Payer: Medicare HMO | Admitting: Cardiology

## 2023-02-08 ENCOUNTER — Other Ambulatory Visit: Payer: Self-pay | Admitting: Internal Medicine

## 2023-02-12 ENCOUNTER — Encounter (HOSPITAL_COMMUNITY): Payer: Medicare HMO

## 2023-02-23 ENCOUNTER — Ambulatory Visit: Payer: Medicare HMO | Attending: Cardiology | Admitting: Cardiology

## 2023-02-23 ENCOUNTER — Ambulatory Visit: Payer: Medicare HMO | Admitting: Cardiology

## 2023-02-23 ENCOUNTER — Encounter: Payer: Self-pay | Admitting: Cardiology

## 2023-02-23 VITALS — BP 122/70 | HR 56 | Ht 63.0 in | Wt 181.2 lb

## 2023-02-23 DIAGNOSIS — I480 Paroxysmal atrial fibrillation: Secondary | ICD-10-CM | POA: Diagnosis not present

## 2023-02-23 DIAGNOSIS — I472 Ventricular tachycardia, unspecified: Secondary | ICD-10-CM

## 2023-02-23 DIAGNOSIS — D6869 Other thrombophilia: Secondary | ICD-10-CM

## 2023-02-23 DIAGNOSIS — I5022 Chronic systolic (congestive) heart failure: Secondary | ICD-10-CM | POA: Diagnosis not present

## 2023-02-23 DIAGNOSIS — I2510 Atherosclerotic heart disease of native coronary artery without angina pectoris: Secondary | ICD-10-CM

## 2023-02-23 LAB — CUP PACEART INCLINIC DEVICE CHECK
Battery Remaining Longevity: 69 mo
Brady Statistic RA Percent Paced: 96 %
Brady Statistic RV Percent Paced: 1.6 %
Date Time Interrogation Session: 20241203164156
HighPow Impedance: 52.6607
HighPow Impedance: 53 Ohm
Implantable Lead Connection Status: 753985
Implantable Lead Connection Status: 753985
Implantable Lead Implant Date: 20070117
Implantable Lead Implant Date: 20070117
Implantable Lead Location: 753859
Implantable Lead Location: 753860
Implantable Lead Model: 7040
Implantable Pulse Generator Implant Date: 20220120
Lead Channel Impedance Value: 510 Ohm
Lead Channel Impedance Value: 512.5 Ohm
Lead Channel Impedance Value: 525 Ohm
Lead Channel Impedance Value: 530 Ohm
Lead Channel Pacing Threshold Amplitude: 0.75 V
Lead Channel Pacing Threshold Amplitude: 0.75 V
Lead Channel Pacing Threshold Amplitude: 0.75 V
Lead Channel Pacing Threshold Amplitude: 0.75 V
Lead Channel Pacing Threshold Pulse Width: 0.5 ms
Lead Channel Pacing Threshold Pulse Width: 0.5 ms
Lead Channel Pacing Threshold Pulse Width: 0.5 ms
Lead Channel Pacing Threshold Pulse Width: 0.5 ms
Lead Channel Sensing Intrinsic Amplitude: 10.7 mV
Lead Channel Sensing Intrinsic Amplitude: 4.6 mV
Lead Channel Setting Pacing Amplitude: 2 V
Lead Channel Setting Pacing Amplitude: 2.5 V
Lead Channel Setting Pacing Pulse Width: 0.5 ms
Lead Channel Setting Sensing Sensitivity: 0.5 mV
Pulse Gen Serial Number: 9976817
Zone Setting Status: 755011

## 2023-02-23 NOTE — Patient Instructions (Signed)
Medication Instructions:  Your physician recommends that you continue on your current medications as directed. Please refer to the Current Medication list given to you today.  *If you need a refill on your cardiac medications before your next appointment, please call your pharmacy*   Lab Work: None ordered   Testing/Procedures: Your physician has recommended that you have an ablation. Catheter ablation is a medical procedure used to treat some cardiac arrhythmias (irregular heartbeats). During catheter ablation, a long, thin, flexible tube is put into a blood vessel in your groin (upper thigh), or neck. This tube is called an ablation catheter. It is then guided to your heart through the blood vessel. Radio frequency waves destroy small areas of heart tissue where abnormal heartbeats may cause an arrhythmia to start.   We will call you to schedule this procedure, it may be several weeks or more.    Follow-Up: At [facility], you and your health needs are our priority.  As part of our continuing mission to provide you with exceptional heart care, we have created designated Provider Care Teams.  These Care Teams include your primary Cardiologist (physician) and Advanced Practice Providers (APPs -  Physician Assistants and Nurse Practitioners) who all work together to provide you with the care you need, when you need it.  Your next appointment:   4 week(s) after your ablation  The format for your next appointment:   In Person  Provider:   Loman Brooklyn, MD{    Thank you for choosing [facility]!!   Dory Horn, RN [phone]

## 2023-02-23 NOTE — Progress Notes (Signed)
Electrophysiology Office Note:   Date:  02/23/2023  ID:  [name], DOB 1946-01-30, MRN [id number]  Primary Cardiologist: [name], MD Electrophysiologist: Regan Lemming, MD      History of Present Illness:   [name] is a 77 y.o. female with h/o diabetes, CVA, hypertension, CKD, atrial fibrillation, chronic systolic heart failure, coronary artery disease, VT seen today for routine electrophysiology followup.   Since last being seen in our clinic the patient reports doing well.  She has not had any chest pain or shortness of breath.  She has had no further ventricular arrhythmias.  She has been having side effects with her mexiletine.  Mexiletine has made it difficult for her to ambulate, and has caused her changes in her vision.  When she stopped the medication, the side effects went away.  she denies chest pain, palpitations, dyspnea, PND, orthopnea, nausea, vomiting, dizziness, syncope, edema, weight gain, or early satiety.   Review of systems complete and found to be negative unless listed in HPI.      EP Information / Studies Reviewed:    EKG is not ordered today. EKG from 01/13/23 reviewed which showed sinus rhythm, first-degree AV block      ICD Interrogation-  reviewed in detail today,  See PACEART report.  Device History: Abbott Dual Chamber ICD implanted 2007 for chronic systolic heart failure History of appropriate therapy: Yes History of AAD therapy: Yes; currently on amiodarone    Risk Assessment/Calculations:    CHA2DS2-VASc Score = 7   This indicates a 11.2% annual risk of stroke. The patient's score is based upon: CHF History: 1 HTN History: 1 Diabetes History: 1 Stroke History: 0 Vascular Disease History: 1 Age Score: 2 Gender Score: 1             Physical Exam:   VS:  BP 122/70 (BP Location: Right Arm, Patient Position: Sitting, Cuff Size: Large)   Pulse (!) 56   Ht 5\' 3"  (1.6 m)   Wt 181 lb 3.2 oz (82.2 kg)   SpO2 97%    BMI 32.10 kg/m    Wt Readings from Last 3 Encounters:  02/23/23 181 lb 3.2 oz (82.2 kg)  01/13/23 184 lb 9.6 oz (83.7 kg)  12/22/22 186 lb (84.4 kg)     GEN: Well nourished, well developed in no acute distress NECK: No JVD; No carotid bruits CARDIAC: Regular rate and rhythm, no murmurs, rubs, gallops RESPIRATORY:  Clear to auscultation without rales, wheezing or rhonchi  ABDOMEN: Soft, non-tender, non-distended EXTREMITIES:  No edema; No deformity   ASSESSMENT AND PLAN:    Chronic systolic dysfunction s/p Abbott dual chamber ICD  euvolemic today Stable on an appropriate medical regimen Normal ICD function See Pace Art report No changes today  2.  Ventricular tachycardia: Has had multiple admissions for VT storm.  Currently on amiodarone.  She is stop mexiletine due to multiple side effects.  She would benefit from ablation for VT.  Risk and benefits have been discussed.  She understands the risks and is agreed to the procedure.  Risk include bleeding, tamponade, infection, stroke, MI, death, renal failure, among others.  She understands the risks and is agreed to the procedure.  3.  Coronary artery disease: No current chest pain.  Plan per primary cardiology.  4.  Paroxysmal atrial fibrillation: Currently on amiodarone.  Remains in sinus rhythm.  5.  Secondary hypercoagulable state: Currently on Eliquis for atrial fibrillation  Disposition:   Follow up with Dr.  Petar Mucci as usual post procedure   Signed, Etai Copado [name], MD

## 2023-02-23 NOTE — Progress Notes (Unsigned)
  Electrophysiology Office Note:   Date:  02/23/2023  ID:  TIMMIA VIDAL, DOB 1945/05/10, MRN [id number]  Primary Cardiologist: [name], MD Electrophysiologist: Rossie Bretado [name], MD  {Click to update primary MD,subspecialty MD or APP then REFRESH:1}    History of Present Illness:   [name] is a 77 y.o. female with h/o hypothyroidism, hyperlipidemia, coronary artery disease, chronic systolic heart failure, atrial fibrillation, VT seen today for routine electrophysiology followup.   Since last being seen in our clinic the patient reports doing ***.  she denies chest pain, palpitations, dyspnea, PND, orthopnea, nausea, vomiting, dizziness, syncope, edema, weight gain, or early satiety.   Review of systems complete and found to be negative unless listed in HPI.      EP Information / Studies Reviewed:    {EKGtoday:28818}      ICD Interrogation-  reviewed in detail today,  See PACEART report.  Device History: Abbott Dual Chamber ICD implanted for CHF History of appropriate therapy: Yes History of AAD therapy: Yes; currently on amiodarone, mexiletine    Risk Assessment/Calculations:    CHA2DS2-VASc Score = 7  {Confirm score is correct.  If not, click here to update score.  REFRESH note.  :1} This indicates a 11.2% annual risk of stroke. The patient's score is based upon: CHF History: 1 HTN History: 1 Diabetes History: 1 Stroke History: 0 Vascular Disease History: 1 Age Score: 2 Gender Score: 1   {This patient has a significant risk of stroke if diagnosed with atrial fibrillation.  Please consider VKA or DOAC agent for anticoagulation if the bleeding risk is acceptable.   You can also use the SmartPhrase .[email or web address] for documentation.   :[id number]} No BP recorded.  {Refresh Note OR Click here to enter BP  :1}***        Physical Exam:   VS:  There were no vitals taken for this visit.   Wt Readings from Last 3 Encounters:  01/13/23 184 lb 9.6 oz (83.7  kg)  12/22/22 186 lb (84.4 kg)  12/09/22 188 lb 11.4 oz (85.6 kg)     GEN: Well nourished, well developed in no acute distress NECK: No JVD; No carotid bruits CARDIAC: {EPRHYTHM:28826}, no murmurs, rubs, gallops RESPIRATORY:  Clear to auscultation without rales, wheezing or rhonchi  ABDOMEN: Soft, non-tender, non-distended EXTREMITIES:  No edema; No deformity   ASSESSMENT AND PLAN:    Chronic systolic dysfunction s/p Abbott dual chamber ICD  euvolemic today Stable on an appropriate medical regimen Normal ICD function See Pace Art report No changes today  2.  VT/VF storm: Currently on amiodarone and mexiletine.***  3.  Coronary artery disease: No current chest pain.  Plan per primary cardiology.  4.  Persistent atrial fibrillation: Currently on amiodarone for VT.  Low burden.  5.  Secondary hypercoagulable state: Currently on Eliquis for atrial fibrillation  Disposition:   Follow up with {EPPROVIDERS:28135} {EPFOLLOW VH:84696}   Signed, Diyana Starrett [name], MD

## 2023-03-08 ENCOUNTER — Telehealth: Payer: Self-pay

## 2023-03-08 NOTE — Telephone Encounter (Signed)
Alert received from [employer] for monitored VT. Event occurred 12/14 @ 21:15, duration 22sec, HR 142. EGM c/w sustained VT - route to triage. Hx of PAF, longest duration 3hrs , controlled rates, burden <1%, Eliquis per PA report.  Patient is currently pending VT ablation. Known VT storm.   Patient called, was asymptomatic and doing well. Compliant with all medications on file including doses. Pt advised I will forward to Dr. Elberta Fortis and follow up if needed. Pt voiced understanding.

## 2023-03-10 ENCOUNTER — Encounter (HOSPITAL_COMMUNITY): Payer: Medicare HMO

## 2023-03-11 ENCOUNTER — Encounter: Payer: Self-pay | Admitting: *Deleted

## 2023-03-11 ENCOUNTER — Telehealth: Payer: Self-pay | Admitting: *Deleted

## 2023-03-11 NOTE — Telephone Encounter (Signed)
Called pt to arrange VT ablation with Dr. Elberta Fortis on 04/02/2023, Patient agreeable to that date.  Verbally reviewed procedure instructions and sent via mychart. Pt is seeing Dr. Alford Highland office tomorrow and advised she can get her blood work there, as they may need to draw labs anyway. Will forward note to their office, as well as place in appt note, asking they draw a BMET & CBC at [facility] tomorrow. Patient verbalized understanding and agreeable to plan.

## 2023-03-12 ENCOUNTER — Ambulatory Visit (HOSPITAL_COMMUNITY)
Admission: RE | Admit: 2023-03-12 | Discharge: 2023-03-12 | Disposition: A | Discharge status: Home / Self Care | Payer: Medicare HMO | Source: Ambulatory Visit | Attending: Cardiology | Admitting: Cardiology

## 2023-03-12 ENCOUNTER — Encounter (HOSPITAL_COMMUNITY): Payer: Self-pay | Admitting: Cardiology

## 2023-03-12 ENCOUNTER — Telehealth (HOSPITAL_COMMUNITY): Payer: Self-pay

## 2023-03-12 VITALS — BP 108/60 | HR 61 | Wt 180.2 lb

## 2023-03-12 DIAGNOSIS — G8929 Other chronic pain: Secondary | ICD-10-CM | POA: Insufficient documentation

## 2023-03-12 DIAGNOSIS — Z79899 Other long term (current) drug therapy: Secondary | ICD-10-CM | POA: Diagnosis not present

## 2023-03-12 DIAGNOSIS — R197 Diarrhea, unspecified: Secondary | ICD-10-CM | POA: Diagnosis not present

## 2023-03-12 DIAGNOSIS — R0602 Shortness of breath: Secondary | ICD-10-CM | POA: Insufficient documentation

## 2023-03-12 DIAGNOSIS — I255 Ischemic cardiomyopathy: Secondary | ICD-10-CM | POA: Insufficient documentation

## 2023-03-12 DIAGNOSIS — I472 Ventricular tachycardia, unspecified: Secondary | ICD-10-CM | POA: Insufficient documentation

## 2023-03-12 DIAGNOSIS — Z8616 Personal history of COVID-19: Secondary | ICD-10-CM | POA: Diagnosis not present

## 2023-03-12 DIAGNOSIS — E1143 Type 2 diabetes mellitus with diabetic autonomic (poly)neuropathy: Secondary | ICD-10-CM | POA: Insufficient documentation

## 2023-03-12 DIAGNOSIS — Z7901 Long term (current) use of anticoagulants: Secondary | ICD-10-CM | POA: Insufficient documentation

## 2023-03-12 DIAGNOSIS — I480 Paroxysmal atrial fibrillation: Secondary | ICD-10-CM | POA: Diagnosis present

## 2023-03-12 DIAGNOSIS — Z87891 Personal history of nicotine dependence: Secondary | ICD-10-CM | POA: Insufficient documentation

## 2023-03-12 DIAGNOSIS — Z955 Presence of coronary angioplasty implant and graft: Secondary | ICD-10-CM | POA: Insufficient documentation

## 2023-03-12 DIAGNOSIS — Z7984 Long term (current) use of oral hypoglycemic drugs: Secondary | ICD-10-CM | POA: Diagnosis not present

## 2023-03-12 DIAGNOSIS — I252 Old myocardial infarction: Secondary | ICD-10-CM | POA: Insufficient documentation

## 2023-03-12 DIAGNOSIS — E1122 Type 2 diabetes mellitus with diabetic chronic kidney disease: Secondary | ICD-10-CM | POA: Diagnosis not present

## 2023-03-12 DIAGNOSIS — Z8673 Personal history of transient ischemic attack (TIA), and cerebral infarction without residual deficits: Secondary | ICD-10-CM | POA: Diagnosis not present

## 2023-03-12 DIAGNOSIS — E875 Hyperkalemia: Secondary | ICD-10-CM | POA: Diagnosis not present

## 2023-03-12 DIAGNOSIS — N183 Chronic kidney disease, stage 3 unspecified: Secondary | ICD-10-CM | POA: Insufficient documentation

## 2023-03-12 DIAGNOSIS — N179 Acute kidney failure, unspecified: Secondary | ICD-10-CM | POA: Insufficient documentation

## 2023-03-12 DIAGNOSIS — I130 Hypertensive heart and chronic kidney disease with heart failure and stage 1 through stage 4 chronic kidney disease, or unspecified chronic kidney disease: Secondary | ICD-10-CM | POA: Diagnosis not present

## 2023-03-12 DIAGNOSIS — E039 Hypothyroidism, unspecified: Secondary | ICD-10-CM | POA: Diagnosis not present

## 2023-03-12 DIAGNOSIS — E785 Hyperlipidemia, unspecified: Secondary | ICD-10-CM | POA: Insufficient documentation

## 2023-03-12 DIAGNOSIS — I5022 Chronic systolic (congestive) heart failure: Secondary | ICD-10-CM | POA: Diagnosis not present

## 2023-03-12 DIAGNOSIS — I2510 Atherosclerotic heart disease of native coronary artery without angina pectoris: Secondary | ICD-10-CM | POA: Diagnosis not present

## 2023-03-12 DIAGNOSIS — Z95810 Presence of automatic (implantable) cardiac defibrillator: Secondary | ICD-10-CM | POA: Insufficient documentation

## 2023-03-12 LAB — CBC
HCT: 42.9 % (ref 36.0–46.0)
Hemoglobin: 13.3 g/dL (ref 12.0–15.0)
MCH: 28.4 pg (ref 26.0–34.0)
MCHC: 31 g/dL (ref 30.0–36.0)
MCV: 91.7 fL (ref 80.0–100.0)
Platelets: 234 10*3/uL (ref 150–400)
RBC: 4.68 MIL/uL (ref 3.87–5.11)
RDW: 15.8 % — ABNORMAL HIGH (ref 11.5–15.5)
WBC: 7.7 10*3/uL (ref 4.0–10.5)
nRBC: 0 % (ref 0.0–0.2)

## 2023-03-12 LAB — COMPREHENSIVE METABOLIC PANEL
ALT: 73 U/L — ABNORMAL HIGH (ref 0–44)
AST: 89 U/L — ABNORMAL HIGH (ref 15–41)
Albumin: 3.3 g/dL — ABNORMAL LOW (ref 3.5–5.0)
Alkaline Phosphatase: 89 U/L (ref 38–126)
Anion gap: 13 (ref 5–15)
BUN: 26 mg/dL — ABNORMAL HIGH (ref 8–23)
CO2: 28 mmol/L (ref 22–32)
Calcium: 8.8 mg/dL — ABNORMAL LOW (ref 8.9–10.3)
Chloride: 103 mmol/L (ref 98–111)
Creatinine, Ser: 2.7 mg/dL — ABNORMAL HIGH (ref 0.44–1.00)
GFR, Estimated: 18 mL/min — ABNORMAL LOW (ref >60)
Glucose, Bld: 82 mg/dL (ref 70–99)
Potassium: 3.4 mmol/L — ABNORMAL LOW (ref 3.5–5.1)
Sodium: 144 mmol/L (ref 135–145)
Total Bilirubin: 0.7 mg/dL (ref <1.2)
Total Protein: 7 g/dL (ref 6.5–8.1)

## 2023-03-12 LAB — BRAIN NATRIURETIC PEPTIDE: B Natriuretic Peptide: 124.1 pg/mL — ABNORMAL HIGH (ref 0.0–100.0)

## 2023-03-12 LAB — HEPATITIS C ANTIBODY: HCV Ab: NONREACTIVE

## 2023-03-12 LAB — HEPATITIS B SURFACE ANTIBODY,QUALITATIVE: Hep B S Ab: NONREACTIVE

## 2023-03-12 LAB — HEPATITIS A ANTIBODY, IGM: Hep A IgM: NONREACTIVE

## 2023-03-12 LAB — HEPATITIS B SURFACE ANTIGEN: Hepatitis B Surface Ag: NONREACTIVE

## 2023-03-12 MED ORDER — FUROSEMIDE 20 MG PO TABS: ORAL_TABLET | ORAL | 11 refills | Status: DC

## 2023-03-12 NOTE — Telephone Encounter (Signed)
Patients lasix medication has been changed and med list updated in pt's chart. In addition, lab orders placed and appointment scheduled. Pt aware, agreeable, and verbalized understanding.

## 2023-03-12 NOTE — Telephone Encounter (Signed)
Noted Confirmed pt arrived for 12/220 visit

## 2023-03-12 NOTE — Patient Instructions (Addendum)
Labs today - will call you if abnormal. No change in medications today. Abdominal ultrasound scheduled - see below. Please have nothing to eat or drink after midnight.  Return to Heart Failure APP Clinic in 3 months.  Please call us at [phone] if any questions or concerns prior to next visit.

## 2023-03-12 NOTE — Telephone Encounter (Signed)
-----   Message from [employer] sent at 03/12/2023  2:08 PM EST ----- Decrease Lasix to 40 mg daily, repeat BMET 1 week.

## 2023-03-13 NOTE — Progress Notes (Signed)
ID:  [name], DOB 1946/01/31, MRN [id number]  Provider location: [facility] Type of Visit: Established patient   PCP:  Pcp, No  HF Cardiologist:  Dr. Shirlee Latch   HPI: [name] is a 77 y.o. female who has a history of CAD, ischemic cardiomyopathy, and atrial fibrillation.  Patient was hospitalized in 11/12 at [facility] for VT with ICD discharge.  She had a left heart cath showing patent stents.  EF was 35-40% by echo.  She is on amiodarone.  She has had periodic problems with creatinine rising with medication adjustments.  Echo in 12/18 showed EF 35-40%, inferior and septal hypokinesis, mild MR.    Echo in 5/20 showed EF 40%, normal RV, mild AI.   She did not tolerate eplerenone due to nausea.  Echo in 4/21 showed EF 40-45% with inferior/inferoseptal HK, normal RV.   In 5/22, she had COVID-19 infection.    Echo 10/22 EF 40% with basal-mid inferior and inferoseptal severe hypokinesis, normal RV, mild AI.   Notified by [facility] 04/24/21 that she had VT, with shock.    In 6/23, she was admitted with altered mental status.  CT showed old right frontal infarct.  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  Echo in 6/23 showed EF 40%, mild LVH, normal IVC.   Admitted 3/24 with VT storm, also noted to have AKI and hyperkalemia. Given Lokelma and GDMT held. She was cardioverted in ED. She was loaded with IV amiodarone, VT zones were changed. Echo showed EF 25-30%. Continued on amiodarone 200 mg bid and discharged home.  Patient was admitted again with VT storm in 7/24.  LHC showed 99% distal LCx stenosis, too small for PCI (medically managed).    Recurrent VT in 9/24, mexiletine was started.   Echo in 10/24 showed EF 35-40%, mild RV dysfunction, mild MR.   Today she returns for HF follow up.  She had another episode of NSVT (22 seconds) on 12/14.  She has had to stop mexiletine due to side effects.  She is now only on amiodarone.  Weight down 4 lbs.  She continues on  Lasix 40 qam/20 qpm. She gets short of breath and fatigued walking longer distances outside her house.  She is short of breath walking up stairs.  She chronically sleeps on 2 pillows. She has chronic knee pain.   St Jude device interrogation (personally reviewed): 93% a-pacing, 7.8% V-pacing, stable thoracic impedance, 1 episode of NSVT 12/14.    ECG (personally reviewed): a-paced, old inferior MI  Labs (1/19): LDL 71, HDL 58, K 4.8, creatinine 7.84, LFTs normal, hgb 12.9.  Labs (2/19): K 4.9, creatinine 1.35  Labs (6/19): K 4.4, creatinine 1.57 Labs (8/19): K 5, creatinine 1.59, LFTs normal Labs (2/20): K 4.4, creatinine 1.4, LDL 72, HDL 55, normal LFTs Labs (6/96): LDL 81, K 4.8, creatinine 2.95 Labs (10/20): hgb 13.6, K 4.2, creatinine 1.62, LFTs normal, LDL 81, HDL 50 Labs (12/20): LDL 88 Labs (3/21): K 4, creatinine 1.56 Labs (5/22): K 4, creatinine 1.64 Labs (7/22): LDL 76 Labs (10/22): K 4.3, creatinine 1.40, LFTs normal, TSH low (Levoxyl stopped). Labs (6/23): LDL 81, K 4.3, creatinine 2.84 Labs (3/24): K 3.9, creatinine 1.91, LFTs normal, TSH normal Labs (4/24): K 4.1, creatinine 1.38 Labs (9/24): AST 126, ALT 206, TSH normal Labs (10/24): K 4.5, creatinine 1.88, LDL 65, TSH 64, BNP 454, AST 149, ALT 118  PMH: 1. Diabetes mellitus 2. CVA, TIA in 2010.   3. HTN 4.  CKD stage 3 5. H/o TAH 6. H/o CCY 7. Sciatica 8. Atrial fibrillation 9. CAD: s/p LAD and RCA PCI.  Last LHC in 11/12 with patent proximal LAD stent, ostial 70% D1 (jailed by stent), mild LAD stent patent, patent RCA stents, EF 40% with global hypokinesis.  - LHC (7/24): 99% dLCx stenosis, too small for PCI. Medically managed.  10. Ischemic cardiomyopathy: Echo (10/12): EF 35-40%, moderate focal basal septal hypertrophy, inferior akinesis, grade I diastolic dysfunction, moderate aortic insufficiency. St Jude dual chamber ICD.  Spironolactone stopped when creatinine rose to 2.5.  Echo (5/14) with EF 40-45%, mild AI.   Echo (6/16) with EF 40-45%, inferior/inferoseptal hypokinesis, mild LVH.  - Echo (12/18): EF 35-40%, inferior and septal hypokinesis, mild MR, mild AI.  - Echo (5/20): EF 40%, inferior and inferoseptal hypokinesis, normal RV size and systolic function, mild AI, normal IVC.  - Unable to tolerate eplerenone.  - Echo (4/21): EF 40-45%, inferior/inferoseptal HK, normal RV, mild AI.  - Echo (10/22): EF 40% with basal-mid inferior and inferoseptal severe hypokinesis, normal RV, mild AI.  - Echo (6/23): EF 40%, mild LVH, IVC normal - Echo (3/24): EF 25-30%, grade I DD, RV normal, moderate MR - Echo (10/24): EF 35-40%, mild RV dysfunction, mild MR. 11. Aortic insufficiency: moderate in past but mild on most recent echo.   12. History of VT: 3/24, 7/24, 9/24.  13. Gastroparesis 14. Hypothyroidism 15. COVID-19 5/22 16. Depression 17. Seizure disorder: Suspected to be related to prior CVA.  18. CTA head/neck (6/23): No significant carotid disease.   SH: Divorced.  3 children.  Quit smoking in 1997. Lives in [place] now with daughter.  [place].   FH: CAD  ROS: All systems reviewed and negative except as per HPI.   Current Outpatient Medications  Medication Sig Dispense Refill   acetaminophen (TYLENOL) 325 MG tablet Take 325 mg by mouth every 6 (six) hours as needed for moderate pain.     amiodarone (PACERONE) 200 MG tablet Take 1 tablet (200 mg total) by mouth daily. 30 tablet 6   aspirin EC 81 MG tablet Take 1 tablet (81 mg total) by mouth daily. Swallow whole. 30 tablet 12   busPIRone (BUSPAR) 5 MG tablet Take 1 tablet (5 mg total) by mouth 2 (two) times daily. 60 tablet 1   carvedilol (COREG) 25 MG tablet Take 1 tablet (25 mg total) by mouth 2 (two) times daily with a meal. 60 tablet 4   Cholecalciferol (VITAMIN D3) 2000 UNITS TABS Take 2,000 Units by mouth every morning.     Continuous Glucose Sensor (FREESTYLE LIBRE 3 SENSOR) MISC 1 each by Does not apply route every 14 (fourteen)  days. 6 each 3   dicyclomine (BENTYL) 10 MG capsule TAKE 1 CAPSULE (10 MG TOTAL) BY MOUTH 4 (FOUR) TIMES DAILY BEFORE MEALS AND AT BEDTIME. 120 capsule 1   ELIQUIS 5 MG TABS tablet TAKE 1 TABLET BY MOUTH TWICE A DAY 60 tablet 11   FARXIGA 10 MG TABS tablet Take 1 tablet (10 mg total) by mouth daily before breakfast. 90 tablet 3   fluticasone (FLONASE) 50 MCG/ACT nasal spray Place 1 spray into both nostrils daily as needed for allergies or rhinitis.     gabapentin (NEURONTIN) 100 MG capsule Take 200 mg by mouth 2 (two) times daily.     insulin aspart (NOVOLOG FLEXPEN) 100 UNIT/ML FlexPen Inject up to 15 units daily under skin as advised 15 mL 11   insulin degludec (TRESIBA FLEXTOUCH)  100 UNIT/ML FlexTouch Pen Inject 12-14 Units into the skin daily. 9 mL 3   levETIRAcetam (KEPPRA XR) 500 MG 24 hr tablet Take 1 tablet (500 mg total) by mouth at bedtime. 90 tablet 3   loperamide (IMODIUM) 2 MG capsule Take 1 capsule (2 mg total) by mouth as needed for diarrhea or loose stools. 30 capsule 0   magnesium oxide (MAG-OX) 400 (240 Mg) MG tablet TAKE 1 TABLET BY MOUTH TWICE A DAY 180 tablet 1   Multiple Vitamins-Minerals (EYE VITAMINS PO) Take 1 tablet by mouth 2 (two) times daily.     nitroGLYCERIN (NITROSTAT) 0.4 MG SL tablet Place 1 tablet (0.4 mg total) under the tongue every 5 (five) minutes as needed for chest pain. 25 tablet 3   omeprazole (PRILOSEC) 20 MG capsule Take 1 capsule by mouth daily.     rosuvastatin (CRESTOR) 40 MG tablet Take 1 tablet (40 mg total) by mouth daily. NEEDS FOLLOW UP APPOINTMENT FOR MORE REFILLS 90 tablet 0   sertraline (ZOLOFT) 50 MG tablet Take 50 mg by mouth at bedtime.     Dulaglutide (TRULICITY) 3 MG/0.5ML SOPN Inject 3 mg into the skin once a week. (Patient not taking: Reported on 03/12/2023) 2 mL 11   furosemide (LASIX) 20 MG tablet Take 2 tablets (40 mg) by mouth once a day. 60 tablet 11   mexiletine (MEXITIL) 150 MG capsule Take 1 capsule (150 mg total) by mouth every  12 (twelve) hours. (Patient not taking: Reported on 03/12/2023) 60 capsule 5   No current facility-administered medications for this encounter.   Wt Readings from Last 3 Encounters:  03/12/23 81.7 kg (180 lb 3.2 oz)  02/23/23 82.2 kg (181 lb 3.2 oz)  01/13/23 83.7 kg (184 lb 9.6 oz)   BP 108/60   Pulse 61   Wt 81.7 kg (180 lb 3.2 oz)   SpO2 94%   BMI 31.92 kg/m   Physical Exam General: NAD Neck: No JVD, no thyromegaly or thyroid nodule.  Lungs: Clear to auscultation bilaterally with normal respiratory effort. CV: Nondisplaced PMI.  Heart regular S1/S2, no S3/S4, no murmur.  No peripheral edema.  No carotid bruit.  Normal pedal pulses.  Abdomen: Soft, nontender, no hepatosplenomegaly, no distention.  Skin: Intact without lesions or rashes.  Neurologic: Alert and oriented x 3.  Psych: Normal affect. Extremities: No clubbing or cyanosis.  HEENT: Normal.   Assessment/Plan 1. Atrial fibrillation: Paroxysmal.  NSR today.  - Continue amiodarone.  LFTs have been elevated, hopefully can stop this medication after her VT ablation.   - Continue Eliquis 5 mg bid.  2. Coronary artery disease: 99% dLCx on 7/24 cath, small vessel and not target for PCI.  Medically managed, no chest pain.  - Continue statin, check lipids/LFTs today. If LFTs are still elevated, want her to stop Crestor and start Repatha (lipid clinic referral).  - No ASA given Eliquis use.   3. Ventricular tachycardia: Several admissions for VT storm.  She is currently on amiodarone 200 mg daily. She was unable to tolerate mexiletine.  - Continue amiodarone 200 mg daily.  Given recurrent VT, I think she needs to continue this for now though she has developed both elevated LFTs and hypothyroidism.  Hopefully can eventually stop after VT ablation.  - VT ablation scheduled for 1/25.  - She did not tolerate ranolazine due to diarrhea.  4. Chronic systolic CHF: Ischemic cardiomyopathy.  St Jude ICD.  EF 40% on 6/23 echo. She has a  Secondary school teacher  ICD. Echo in 3/24 showed EF 25-30%, no RWMAs. Echo in 10/24 showed EF 35-40%, mild RV dysfunction, mild MR.  NYHA class III though she is not volume overloaded by exam or Optivol.  GDMT limited CKD stage 3.  - Continue Lasix 40 qam/20 qpm. BMET/BNP today.  - Continue Coreg 25 mg bid.  - Jardiance 10 mg daily.   - Entresto stopped in the past due to AKI/hyperkalemia.  - She has tolerated neither spironolactone nor eplerenone.  5. CKD stage 3: BMET today.  - Continue Jardiance.  6. Hypothyroidism: Managed by PCP.  7. Hyperlipidemia: On Crestor, check lipids/LFTs today.  If LFTs high still, would favor stopping Crestor and starting Repatha instead.  8. Elevated LFTs: Noted on 9/24 labs.  Concerned that this could be due to amiodarone vs Crestor.   - Repeat LFTs today. If still high, transition Crestor to amiodarone as above.  - Will need to continue amiodarone for now, at least until she has had VT ablation.  - RUQ [name] - Send viral hepatitis panel.   Followup in 3 months with APP.   Signed, [name], MD  03/13/2023  [facility] [address] [facility] [place] [address] [phone] (office) [phone] (fax)

## 2023-03-15 ENCOUNTER — Ambulatory Visit (HOSPITAL_COMMUNITY): Payer: Medicare HMO

## 2023-03-15 ENCOUNTER — Telehealth (HOSPITAL_COMMUNITY): Payer: Self-pay | Admitting: *Deleted

## 2023-03-15 DIAGNOSIS — E785 Hyperlipidemia, unspecified: Secondary | ICD-10-CM

## 2023-03-15 DIAGNOSIS — I2510 Atherosclerotic heart disease of native coronary artery without angina pectoris: Secondary | ICD-10-CM

## 2023-03-15 NOTE — Telephone Encounter (Signed)
-----   Message from [name] sent at 03/13/2023 11:08 PM EST ----- Make referral to lipid clinic to make transition from Crestor to Repatha given elevated LFTs.

## 2023-03-15 NOTE — Telephone Encounter (Signed)
Spoke w/pt she is aware, agreeable, and verbalized understanding, ref placed

## 2023-03-18 ENCOUNTER — Telehealth: Payer: Self-pay

## 2023-03-18 ENCOUNTER — Ambulatory Visit (HOSPITAL_COMMUNITY)
Admission: RE | Admit: 2023-03-18 | Discharge: 2023-03-18 | Disposition: A | Discharge status: Home / Self Care | Payer: Medicare HMO | Source: Ambulatory Visit | Attending: Cardiology | Admitting: Cardiology

## 2023-03-18 DIAGNOSIS — I5022 Chronic systolic (congestive) heart failure: Secondary | ICD-10-CM

## 2023-03-18 LAB — BASIC METABOLIC PANEL
Anion gap: 18 — ABNORMAL HIGH (ref 5–15)
BUN: 30 mg/dL — ABNORMAL HIGH (ref 8–23)
CO2: 16 mmol/L — ABNORMAL LOW (ref 22–32)
Calcium: 8.5 mg/dL — ABNORMAL LOW (ref 8.9–10.3)
Chloride: 107 mmol/L (ref 98–111)
Creatinine, Ser: 2.55 mg/dL — ABNORMAL HIGH (ref 0.44–1.00)
GFR, Estimated: 19 mL/min — ABNORMAL LOW (ref >60)
Glucose, Bld: 289 mg/dL — ABNORMAL HIGH (ref 70–99)
Potassium: 4.1 mmol/L (ref 3.5–5.1)
Sodium: 141 mmol/L (ref 135–145)

## 2023-03-18 NOTE — Telephone Encounter (Signed)
Please see EGM below at end of previous message. Pt is no longer in VT as of this am.

## 2023-03-18 NOTE — Telephone Encounter (Signed)
Alert received from [employer] for Slow VT at 110-120 bpm There were 9 nonsustained VT episodes and 3 VT in the monitor only zone.  VT monitor only zone is 140 bpm and treatment is 164 bpm.  Presenting rhythm shows ongoing slow VT at 110-120 bpm, sent to triage high priority Known PAF, on OAC, AF burden is 1% of the time, longest event was 45 minutes, good ventricular rate control.  Patient is pending VT ablation 04/02/23.  PT reports yesterday 03/17/23 she noted palpitations. States she would take a deep breath in, palpitations would then subside. Also reports of lightheaded at time.  Pt denies chest pain. Today patient is asymptomatic. Compliant with medications on MAR including doses.   Routing to Dr. Elberta Fortis as Lorain Childes.        Manual transmission received 03/18/23. Pt is now back in SR.

## 2023-03-21 NOTE — Pre-Procedure Instructions (Signed)
Pt is scheduled for procedure on 1/10 with anesthesia.  She is takes her Trulicity on Sundays, she will take a dose today, she will not take a dose on 1/5.  She will resume the following Sunday.  She verbalized understanding.

## 2023-03-23 ENCOUNTER — Encounter: Payer: Self-pay | Admitting: Cardiology

## 2023-04-01 NOTE — Anesthesia Preprocedure Evaluation (Addendum)
 Anesthesia Evaluation  Patient identified by MRN, date of birth, ID band Patient awake    Reviewed: Allergy & Precautions, Patient's Chart, lab work & pertinent test results, reviewed documented beta blocker date and time   Airway Mallampati: II  TM Distance: >3 FB Neck ROM: Full    Dental no notable dental hx.    Pulmonary sleep apnea , former smoker   Pulmonary exam normal        Cardiovascular hypertension, Pt. on home beta blockers and Pt. on medications + CAD, + Peripheral Vascular Disease and +CHF  + dysrhythmias Atrial Fibrillation and Ventricular Tachycardia + Cardiac Defibrillator  Rhythm:Irregular Rate:Normal  TTE 2024  1. Left ventricular ejection fraction, by estimation, is 35 to 40%. The  left ventricle has moderately decreased function. The left ventricle  demonstrates global hypokinesis. Left ventricular diastolic parameters are  consistent with Grade I diastolic  dysfunction (impaired relaxation).   2. Right ventricular systolic function is mildly reduced. The right  ventricular size is normal. There is normal pulmonary artery systolic  pressure.   3. The mitral valve is normal in structure. Mild mitral valve  regurgitation. No evidence of mitral stenosis.   4. The aortic valve is tricuspid. Aortic valve regurgitation is mild.  Aortic valve sclerosis/calcification is present, without any evidence of  aortic stenosis.   5. The inferior vena cava is normal in size with greater than 50%  respiratory variability, suggesting right atrial pressure of 3 mmHg.   Cath 2024 Patent mid LAD stent with minimal restenosis.  Severe stenosis in the very distal AV groove Circumflex artery leading into a very small obtuse marginal branch-too small for PCI Large dominant RCA with patent proximal and mid stents with minimal restenosis.  NO targets for PCI LVEDP 15 mmHg    Neuro/Psych Seizures - (keppra ),  CVA  negative psych  ROS   GI/Hepatic negative GI ROS, Neg liver ROS,,,  Endo/Other  diabetes, Type 1, Insulin  DependentHypothyroidism    Renal/GU CRFRenal disease  negative genitourinary   Musculoskeletal  (+) Arthritis ,    Abdominal Normal abdominal exam  (+)   Peds  Hematology  (+) Blood dyscrasia (eliquis )   Anesthesia Other Findings - Eliquis  03/28/23  Reproductive/Obstetrics                             Anesthesia Physical Anesthesia Plan  ASA: 4  Anesthesia Plan: MAC   Post-op Pain Management:    Induction: Intravenous  PONV Risk Score and Plan: 3 and Dexamethasone, Ondansetron , Treatment may vary due to age or medical condition and Propofol  infusion  Airway Management Planned: Simple Face Mask and Nasal Cannula  Additional Equipment: ClearSight  Intra-op Plan:   Post-operative Plan:   Informed Consent: I have reviewed the patients History and Physical, chart, labs and discussed the procedure including the risks, benefits and alternatives for the proposed anesthesia with the patient or authorized representative who has indicated his/her understanding and acceptance.     Dental advisory given  Plan Discussed with: CRNA  Anesthesia Plan Comments:        Anesthesia Quick Evaluation

## 2023-04-01 NOTE — Pre-Procedure Instructions (Signed)
 Instructed patient on the following items: Arrival time 0515 Nothing to eat or drink after midnight No meds AM of procedure Responsible person to drive you home and stay with you for 24 hrs  Have you missed any doses of anti-coagulant Eliquis - should be taken twice a day, she states she ran out on Sunday, so last dose was Sunday.  Dr Inocencio aware, will do EKG in the am.

## 2023-04-02 ENCOUNTER — Ambulatory Visit (HOSPITAL_COMMUNITY): Payer: Medicare PPO

## 2023-04-02 ENCOUNTER — Ambulatory Visit (HOSPITAL_COMMUNITY)
Admission: RE | Admit: 2023-04-02 | Discharge: 2023-04-02 | Disposition: A | Discharge status: Home / Self Care | Payer: Medicare PPO | Attending: Cardiology | Admitting: Cardiology

## 2023-04-02 ENCOUNTER — Telehealth: Payer: Self-pay

## 2023-04-02 ENCOUNTER — Encounter (HOSPITAL_COMMUNITY)
Admission: RE | Disposition: A | Discharge status: Home / Self Care | Payer: Medicare PPO | Source: Home / Self Care | Attending: Cardiology

## 2023-04-02 ENCOUNTER — Other Ambulatory Visit (HOSPITAL_COMMUNITY): Payer: Self-pay

## 2023-04-02 ENCOUNTER — Telehealth: Payer: Self-pay | Admitting: Pulmonary Disease

## 2023-04-02 ENCOUNTER — Other Ambulatory Visit: Payer: Self-pay

## 2023-04-02 DIAGNOSIS — I5022 Chronic systolic (congestive) heart failure: Secondary | ICD-10-CM | POA: Insufficient documentation

## 2023-04-02 DIAGNOSIS — I472 Ventricular tachycardia, unspecified: Secondary | ICD-10-CM | POA: Diagnosis not present

## 2023-04-02 DIAGNOSIS — G4733 Obstructive sleep apnea (adult) (pediatric): Secondary | ICD-10-CM | POA: Diagnosis not present

## 2023-04-02 DIAGNOSIS — I130 Hypertensive heart and chronic kidney disease with heart failure and stage 1 through stage 4 chronic kidney disease, or unspecified chronic kidney disease: Secondary | ICD-10-CM | POA: Insufficient documentation

## 2023-04-02 DIAGNOSIS — N189 Chronic kidney disease, unspecified: Secondary | ICD-10-CM | POA: Diagnosis not present

## 2023-04-02 DIAGNOSIS — I2510 Atherosclerotic heart disease of native coronary artery without angina pectoris: Secondary | ICD-10-CM | POA: Diagnosis not present

## 2023-04-02 DIAGNOSIS — E1122 Type 2 diabetes mellitus with diabetic chronic kidney disease: Secondary | ICD-10-CM | POA: Insufficient documentation

## 2023-04-02 DIAGNOSIS — I471 Supraventricular tachycardia, unspecified: Secondary | ICD-10-CM

## 2023-04-02 DIAGNOSIS — I4891 Unspecified atrial fibrillation: Secondary | ICD-10-CM | POA: Diagnosis not present

## 2023-04-02 DIAGNOSIS — I10 Essential (primary) hypertension: Secondary | ICD-10-CM | POA: Diagnosis not present

## 2023-04-02 DIAGNOSIS — Z87891 Personal history of nicotine dependence: Secondary | ICD-10-CM | POA: Diagnosis not present

## 2023-04-02 HISTORY — PX: V TACH ABLATION: EP1227

## 2023-04-02 LAB — GLUCOSE, CAPILLARY
Glucose-Capillary: 149 mg/dL — ABNORMAL HIGH (ref 70–99)
Glucose-Capillary: 151 mg/dL — ABNORMAL HIGH (ref 70–99)
Glucose-Capillary: 135 mg/dL — ABNORMAL HIGH (ref 70–99)

## 2023-04-02 LAB — POCT ACTIVATED CLOTTING TIME
Activated Clotting Time: 262 s
Activated Clotting Time: 314 s

## 2023-04-02 SURGERY — V TACH ABLATION
Anesthesia: Monitor Anesthesia Care

## 2023-04-02 MED ORDER — HEPARIN SODIUM (PORCINE) 1000 UNIT/ML IJ SOLN
INTRAMUSCULAR | Status: DC | PRN
  Administered 2023-04-02: 14000 [IU] via INTRAVENOUS
  Administered 2023-04-02: 6000 [IU] via INTRAVENOUS

## 2023-04-02 MED ORDER — EPHEDRINE SULFATE-NACL 50-0.9 MG/10ML-% IV SOSY
PREFILLED_SYRINGE | INTRAVENOUS | Status: DC | PRN
  Administered 2023-04-02: 10 mg via INTRAVENOUS
  Administered 2023-04-02: 5 mg via INTRAVENOUS

## 2023-04-02 MED ORDER — RIVAROXABAN 15 MG PO TABS
15.0000 mg | ORAL_TABLET | Freq: Every day | ORAL | 6 refills | Status: DC
  Filled 2023-04-02 – 2023-05-07 (×2): qty 30, 30d supply, fill #0

## 2023-04-02 MED ORDER — SODIUM CHLORIDE 0.9% FLUSH: 3.0000 mL | Freq: Two times a day (BID) | INTRAVENOUS | Status: DC

## 2023-04-02 MED ORDER — PROTAMINE SULFATE 10 MG/ML IV SOLN
INTRAVENOUS | Status: DC | PRN
  Administered 2023-04-02: 40 mg via INTRAVENOUS

## 2023-04-02 MED ORDER — ONDANSETRON HCL 4 MG/2ML IJ SOLN: 4.0000 mg | Freq: Four times a day (QID) | INTRAMUSCULAR | Status: DC | PRN

## 2023-04-02 MED ORDER — PHENYLEPHRINE 80 MCG/ML (10ML) SYRINGE FOR IV PUSH (FOR BLOOD PRESSURE SUPPORT)
PREFILLED_SYRINGE | INTRAVENOUS | Status: DC | PRN
  Administered 2023-04-02: 80 ug via INTRAVENOUS
  Administered 2023-04-02 (×5): 160 ug via INTRAVENOUS
  Administered 2023-04-02: 240 ug via INTRAVENOUS

## 2023-04-02 MED ORDER — FENTANYL CITRATE (PF) 100 MCG/2ML IJ SOLN
INTRAMUSCULAR | Status: AC
  Filled 2023-04-02: qty 2

## 2023-04-02 MED ORDER — PROPOFOL 500 MG/50ML IV EMUL
INTRAVENOUS | Status: DC | PRN
  Administered 2023-04-02 (×2): 50 ug/kg/min via INTRAVENOUS

## 2023-04-02 MED ORDER — HEPARIN SODIUM (PORCINE) 1000 UNIT/ML IJ SOLN
INTRAMUSCULAR | Status: AC
  Filled 2023-04-02: qty 10

## 2023-04-02 MED ORDER — ACETAMINOPHEN 325 MG PO TABS: 650.0000 mg | ORAL_TABLET | ORAL | Status: DC | PRN

## 2023-04-02 MED ORDER — BUPIVACAINE HCL (PF) 0.25 % IJ SOLN
INTRAMUSCULAR | Status: DC | PRN
  Administered 2023-04-02: 30 mL

## 2023-04-02 MED ORDER — APIXABAN 5 MG PO TABS
5.0000 mg | ORAL_TABLET | Freq: Two times a day (BID) | ORAL | 11 refills | Status: DC
  Filled 2023-04-02: qty 60, 30d supply, fill #0

## 2023-04-02 MED ORDER — FENTANYL CITRATE (PF) 100 MCG/2ML IJ SOLN
INTRAMUSCULAR | Status: DC | PRN
  Administered 2023-04-02: 50 ug via INTRAVENOUS

## 2023-04-02 MED ORDER — PHENYLEPHRINE HCL-NACL 20-0.9 MG/250ML-% IV SOLN
INTRAVENOUS | Status: DC | PRN
  Administered 2023-04-02: 60 ug/min via INTRAVENOUS

## 2023-04-02 MED ORDER — CEFAZOLIN SODIUM-DEXTROSE 2-3 GM-%(50ML) IV SOLR
INTRAVENOUS | Status: DC | PRN
  Administered 2023-04-02: 2 g via INTRAVENOUS

## 2023-04-02 MED ORDER — BUPIVACAINE HCL (PF) 0.25 % IJ SOLN
INTRAMUSCULAR | Status: AC
  Filled 2023-04-02: qty 30

## 2023-04-02 MED ORDER — SODIUM CHLORIDE 0.9% FLUSH: 3.0000 mL | INTRAVENOUS | Status: DC | PRN

## 2023-04-02 MED ORDER — HEPARIN (PORCINE) IN NACL 1000-0.9 UT/500ML-% IV SOLN
INTRAVENOUS | Status: DC | PRN
  Administered 2023-04-02 (×2): 500 mL

## 2023-04-02 MED ORDER — SODIUM CHLORIDE 0.9 % IV SOLN
INTRAVENOUS | Status: DC
Start: 2023-04-02 — End: 2023-04-02

## 2023-04-02 MED ORDER — ONDANSETRON HCL 4 MG/2ML IJ SOLN
INTRAMUSCULAR | Status: DC | PRN
  Administered 2023-04-02: 4 mg via INTRAVENOUS

## 2023-04-02 MED ORDER — CEFAZOLIN SODIUM-DEXTROSE 2-4 GM/100ML-% IV SOLN
INTRAVENOUS | Status: AC
  Filled 2023-04-02: qty 100

## 2023-04-02 MED ORDER — SODIUM CHLORIDE 0.9 % IV SOLN: 250.0000 mL | INTRAVENOUS | Status: DC | PRN

## 2023-04-02 MED ORDER — APIXABAN 5 MG PO TABS: 5.0000 mg | ORAL_TABLET | Freq: Two times a day (BID) | ORAL | 6 refills | Status: DC

## 2023-04-02 SURGICAL SUPPLY — 18 items
CATH JOSEPH QUAD ALLRED 6F REP (CATHETERS) IMPLANT
CATH OPTRLL 48 DF HIGH DEN MAP (CATHETERS) IMPLANT
CATH SOUNDSTAR ECO 8FR (CATHETERS) IMPLANT
CATH WEB BI DIR CSDF CRV REPRO (CATHETERS) IMPLANT
CLOSURE MYNX CONTROL 6F/7F (Vascular Products) IMPLANT
CLOSURE PERCLOSE PROSTYLE (VASCULAR PRODUCTS) IMPLANT
COVER DOME SNAP 22 D (MISCELLANEOUS) IMPLANT
KIT VERSACROSS CNCT FARADRIVE (KITS) IMPLANT
MAT PREVALON FULL STRYKER (MISCELLANEOUS) IMPLANT
PACK EP LF (CUSTOM PROCEDURE TRAY) ×1 IMPLANT
PAD DEFIB RADIO PHYSIO CONN (PAD) ×1 IMPLANT
PATCH CARTO3 (PAD) IMPLANT
SHEATH CARTO VIZIGO MED CURVE (SHEATH) IMPLANT
SHEATH PINNACLE 6F 10CM (SHEATH) IMPLANT
SHEATH PINNACLE 8F 10CM (SHEATH) IMPLANT
SHEATH PINNACLE 9F 10CM (SHEATH) IMPLANT
SHEATH PROBE COVER 6X72 (BAG) IMPLANT
TUBING SMART ABLATE COOLFLOW (TUBING) IMPLANT

## 2023-04-02 NOTE — Discharge Instructions (Signed)

## 2023-04-02 NOTE — H&P (Signed)
  Electrophysiology Office Note:   Date:  04/02/2023  ID:  RAYYAN ORSBORN, DOB [birth year], MRN [id number]  Primary Cardiologist: Ezra Shuck, MD Electrophysiologist: Soyla Gladis Norton, MD      History of Present Illness:   [name] is a 78 y.o. female with h/o diabetes, CVA, hypertension, CKD, atrial fibrillation, chronic systolic heart failure, coronary artery disease, VT seen today for routine electrophysiology followup.   Today, denies symptoms of palpitations, chest pain, shortness of breath, orthopnea, PND, lower extremity edema, claudication, dizziness, presyncope, syncope, bleeding, or neurologic sequela. The patient is tolerating medications without difficulties. Plan VT ablation today.     EP Information / Studies Reviewed:    EKG is not ordered today. EKG from 01/13/23 reviewed which showed sinus rhythm, first-degree AV block      ICD Interrogation-  reviewed in detail today,  See PACEART report.  Device History: Abbott Dual Chamber ICD implanted 2007 for chronic systolic heart failure History of appropriate therapy: Yes History of AAD therapy: Yes; currently on amiodarone     Risk Assessment/Calculations:    CHA2DS2-VASc Score = 7   This indicates a 11.2% annual risk of stroke. The patient's score is based upon: CHF History: 1 HTN History: 1 Diabetes History: 1 Stroke History: 0 Vascular Disease History: 1 Age Score: 2 Gender Score: 1            Physical Exam:   VS:  BP (!) 144/64   Pulse (!) 51   Temp 98.2 F (36.8 C) (Oral)   Resp 16   Ht 5' 3 (1.6 m)   Wt 81.6 kg   SpO2 95%   BMI 31.89 kg/m    Wt Readings from Last 3 Encounters:  04/02/23 81.6 kg  03/12/23 81.7 kg  02/23/23 82.2 kg    GEN: No acute distress.   Neck: No JVD Cardiac: RRR, no murmurs, rubs, or gallops.  Respiratory: decreased BS bases bilaterally. GI: Soft, nontender, non-distended  MS: No edema; No deformity. Neuro:  Nonfocal  Skin: warm and dry, device  site well healed Psych: Normal affect  ASSESSMENT AND PLAN:    Chronic systolic dysfunction s/p Abbott dual chamber ICD  euvolemic today Stable on an appropriate medical regimen Normal ICD function See Pace Art report No changes today  2.  Ventricular tachycardia: Angeline VEAR Schick has presented today for surgery, with the diagnosis of VT.  The various methods of treatment have been discussed with the patient and family. After consideration of risks, benefits and other options for treatment, the patient has consented to  Procedure(s): Catheter ablation as a surgical intervention .  Risks include but not limited to complete heart block, stroke, esophageal damage, nerve damage, bleeding, vascular damage, tamponade, perforation, MI, and death. The patient's history has been reviewed, patient examined, no change in status, stable for surgery.  I have reviewed the patient's chart and labs.  Questions were answered to the patient's satisfaction.    Dedria Endres Norton, MD 04/02/2023 7:10 AM

## 2023-04-02 NOTE — Telephone Encounter (Signed)
 Patient had planned VT ablation procedure on 04/02/23 but had mobile mass noted on the posterior medial papillary muscle.    She was initially planned to start on Eliquis , however due to cost this was not an option and she had previously used a 30 day card for medication.    She was discharged on Xarelto  15mg  daily.  Her Cr Cl is 24mL/min.  She will need review for medication assistance.  At her next office visit in clinic she will need repeat BMP to review renal function.    Daphne Barrack, MSN, APRN, NP-C, AGACNP-BC [facility] - Electrophysiology  04/02/2023, 8:04 PM

## 2023-04-02 NOTE — Transfer of Care (Signed)
 Immediate Anesthesia Transfer of Care Note  Patient: [name]  Procedure(s) Performed: LULLA BOOM ABLATION  Patient Location: PACU  Anesthesia Type:MAC  Level of Consciousness: awake, alert , and oriented  Airway & Oxygen Therapy: Patient Spontanous Breathing and Patient connected to nasal cannula oxygen  Post-op Assessment: Report given to RN and Post -op Vital signs reviewed and stable  Post vital signs: Reviewed and stable  Last Vitals:  Vitals Value Taken Time  BP    Temp    Pulse 55 04/02/23 0950  Resp 17 04/02/23 0950  SpO2 95 % 04/02/23 0950  Vitals shown include unfiled device data.  Last Pain:  Vitals:   04/02/23 0629  TempSrc: Oral  PainSc:          Complications: There were no known notable events for this encounter.

## 2023-04-02 NOTE — Progress Notes (Signed)
Patient walked to the bathroom without difficulties. Bilateral groin sites, level 0, clean, dry, and intact.

## 2023-04-02 NOTE — Telephone Encounter (Signed)
 [name] is mailing patient a BMS application.

## 2023-04-04 ENCOUNTER — Encounter (HOSPITAL_COMMUNITY): Payer: Self-pay | Admitting: Cardiology

## 2023-04-06 ENCOUNTER — Telehealth: Payer: Self-pay

## 2023-04-06 NOTE — Telephone Encounter (Signed)
 Relayed to pt. Pt verbalized understanding.

## 2023-04-06 NOTE — Telephone Encounter (Signed)
 Device alert for monitored VT event Event occurred 1/13 @ 09:43, duration 16sec, HR 150.  Appears VT ablation 1/10 was not completed 1 NSVT, 10sec in duration HR 197 Hx of PAF, longest duration 48sec, controlled rates, burden 4%, Xarelto .  Working on med assistance for Eliquis  f/u scheduled 2/13 Route to triage LA, CVRS  First VT episode @ 0142 on 04/05/23 appears to be polymorphic VT that breaks and restarts into monomorphic VT. False termination @ 16 seconds. V>A. Falls under detection. Called pt. Awake @ the time of the event. Pt reports HR was 116. Was checking HR w/ BP machine. And checked BP about a half dozen times. Lasted about 5 minutes. No syncope. Fluttering. While she felt this HR was reading between 111-116. After she noticed fluttering stopped she noted her HR was down to 101. Compliant w/ meds.

## 2023-04-06 NOTE — Anesthesia Postprocedure Evaluation (Signed)
 Anesthesia Post Note  Patient: [name]  Procedure(s) Performed: LULLA BOOM ABLATION     Patient location during evaluation: PACU Anesthesia Type: MAC Level of consciousness: awake and alert Pain management: pain level controlled Vital Signs Assessment: post-procedure vital signs reviewed and stable Respiratory status: spontaneous breathing, nonlabored ventilation, respiratory function stable and patient connected to nasal cannula oxygen Cardiovascular status: stable and blood pressure returned to baseline Postop Assessment: no apparent nausea or vomiting Anesthetic complications: no   There were no known notable events for this encounter.  Last Vitals:  Vitals:   04/02/23 1200 04/02/23 1300  BP: (!) 110/56 (!) 104/57  Pulse: (!) 55 (!) 54  Resp: 19 17  Temp:    SpO2: 92% 91%    Last Pain:  Vitals:   04/02/23 1041  TempSrc:   PainSc: 0-No pain                 Avonell Lenig L Tulani Kidney

## 2023-04-07 NOTE — Telephone Encounter (Signed)
 Spoke to pts son to advise I would like patient to come in for reprogramming ICD. States he will have patient return call. Advised I put patient on schedule for 04/08/23 @ 8:40am since it is the last apt. For the day. Voiced understanding and states he will pt call back in few minutes.

## 2023-04-07 NOTE — Telephone Encounter (Signed)
 I spoke with the pt and she states she will be at the appointment at 04/08/2023 at 8:40 am.

## 2023-04-07 NOTE — Telephone Encounter (Signed)
 Pt called and confirmed device clinic apt 04/08/23 @ 8:40 am.

## 2023-04-08 ENCOUNTER — Ambulatory Visit: Payer: Medicare PPO | Attending: Cardiovascular Disease

## 2023-04-08 DIAGNOSIS — I472 Ventricular tachycardia, unspecified: Secondary | ICD-10-CM

## 2023-04-10 ENCOUNTER — Other Ambulatory Visit: Payer: Self-pay | Admitting: Cardiology

## 2023-04-12 ENCOUNTER — Telehealth: Payer: Self-pay | Admitting: Pharmacy Technician

## 2023-04-12 ENCOUNTER — Other Ambulatory Visit (HOSPITAL_COMMUNITY): Payer: Self-pay

## 2023-04-12 NOTE — Telephone Encounter (Signed)
Thank you :)

## 2023-04-12 NOTE — Telephone Encounter (Signed)
PAP: PAP application for Xarelto, [employer] has been mailed to pt's home address on file. Will fax provider portion of application to provider's office when pt's portion is received.

## 2023-04-12 NOTE — Telephone Encounter (Signed)
Provider changed patient to Xarelto, will need a Xarelto app mailed instead of BMS.

## 2023-04-14 ENCOUNTER — Other Ambulatory Visit: Payer: Self-pay | Admitting: Physician Assistant

## 2023-04-14 ENCOUNTER — Other Ambulatory Visit: Payer: Self-pay | Admitting: Student

## 2023-04-14 ENCOUNTER — Ambulatory Visit (INDEPENDENT_AMBULATORY_CARE_PROVIDER_SITE_OTHER): Payer: Medicare HMO

## 2023-04-14 ENCOUNTER — Other Ambulatory Visit: Payer: Self-pay | Admitting: Neurology

## 2023-04-14 DIAGNOSIS — I255 Ischemic cardiomyopathy: Secondary | ICD-10-CM

## 2023-04-14 LAB — CUP PACEART INCLINIC DEVICE CHECK
Date Time Interrogation Session: 20250116171017
Implantable Lead Connection Status: 753985
Implantable Lead Connection Status: 753985
Implantable Lead Implant Date: 20070117
Implantable Lead Implant Date: 20070117
Implantable Lead Location: 753859
Implantable Lead Location: 753860
Implantable Lead Model: 7040
Implantable Pulse Generator Implant Date: 20220120
Pulse Gen Serial Number: 9976817

## 2023-04-14 NOTE — Progress Notes (Signed)
Patient brought in acutely to lower VT zones due to frequent slow VT episodes occurring as low as 119bpm.  She is scheduled for a second attempt at VT ablation after being anticoagulated (see EPIC phone and procedure note for details).   Joni Reining with [employer] present and assisting with device interrogation and programming changes today.  Normal Device function. Threshold, sensing, lead impedances stable and consistent.   Reviewed with Dr. Nelly Laurence who is DOD:  VT1 zone decreased from 139 to 110 with ATP therapy on. (Max track rate lowered from 120 to 100).   FULL DETAIL OF PROGRAMMING CHANGES:

## 2023-04-15 LAB — CUP PACEART REMOTE DEVICE CHECK
Battery Remaining Longevity: 64 mo
Battery Remaining Percentage: 66 %
Battery Voltage: 2.99 V
Brady Statistic AP VP Percent: 2.9 %
Brady Statistic AP VS Percent: 94 %
Brady Statistic AS VP Percent: 1 %
Brady Statistic AS VS Percent: 1.9 %
Brady Statistic RA Percent Paced: 96 %
Brady Statistic RV Percent Paced: 3.7 %
Date Time Interrogation Session: 20250122020016
HighPow Impedance: 51 Ohm
HighPow Impedance: 52 Ohm
Implantable Lead Connection Status: 753985
Implantable Lead Connection Status: 753985
Implantable Lead Implant Date: 20070117
Implantable Lead Implant Date: 20070117
Implantable Lead Location: 753859
Implantable Lead Location: 753860
Implantable Lead Model: 7040
Implantable Pulse Generator Implant Date: 20220120
Lead Channel Impedance Value: 540 Ohm
Lead Channel Impedance Value: 590 Ohm
Lead Channel Pacing Threshold Amplitude: 0.75 V
Lead Channel Pacing Threshold Amplitude: 1 V
Lead Channel Pacing Threshold Pulse Width: 0.5 ms
Lead Channel Pacing Threshold Pulse Width: 0.5 ms
Lead Channel Sensing Intrinsic Amplitude: 11.8 mV
Lead Channel Sensing Intrinsic Amplitude: 4.6 mV
Lead Channel Setting Pacing Amplitude: 2 V
Lead Channel Setting Pacing Amplitude: 2.5 V
Lead Channel Setting Pacing Pulse Width: 0.5 ms
Lead Channel Setting Sensing Sensitivity: 0.5 mV
Pulse Gen Serial Number: 9976817

## 2023-04-26 ENCOUNTER — Encounter: Payer: Self-pay | Admitting: Pharmacy Technician

## 2023-04-26 NOTE — Telephone Encounter (Signed)
Sent the patient a mychart message for a follow up since we have not heard back about their application

## 2023-04-28 ENCOUNTER — Ambulatory Visit: Payer: Medicare PPO

## 2023-05-06 ENCOUNTER — Ambulatory Visit: Payer: Medicare PPO | Admitting: Student

## 2023-05-07 ENCOUNTER — Other Ambulatory Visit: Payer: Self-pay

## 2023-05-07 ENCOUNTER — Other Ambulatory Visit (HOSPITAL_COMMUNITY): Payer: Self-pay

## 2023-05-07 ENCOUNTER — Encounter (HOSPITAL_COMMUNITY): Payer: Self-pay

## 2023-05-07 ENCOUNTER — Encounter: Payer: Self-pay | Admitting: Pharmacy Technician

## 2023-05-07 NOTE — Telephone Encounter (Signed)
Patient phone number says not in service. Sending patient 2nd mychart

## 2023-05-09 NOTE — Progress Notes (Deleted)
 Cardiology Office Note Date:  05/09/2023  Patient ID:  Christon, Gallaway October 18, 1945, MRN [id number] PCP:  Pcp, No  Cardiologist:  Dr. Shirlee Latch EP: Dr. Johney Frame     Chief Complaint:    6 mo  History of Present Illness: [name] is a 78 y.o. female with history of CAD (s/p LAD and RCA PCI.  Last LHC in 11/12 with patent proximal LAD stent, ostial 70% D1 (jailed by stent), mild LAD stent patent, patent RCA stents), ICM, chronic CHF, VT, ICD, AFib, stroke, DM, HTN, HLD, CKD (III), gastroparesis, Hypothyroidism.  Seeing EP and AHF teams regularly  March 2024: admitted with VT storm Amiodarone re-loaded  July 2024,  admitted with VT (she felt triggered by GI illness/ranexa, amiodarone re-loaded July again with VF  Sept 2024 admitted with a slow VT (arrived to the office in VT)  12/31/23: EPS >> thrombus, mobile mass note posteromedial papillary muscle >> procedure aborted, no ablation performed Scar mapping showed scar in the lateral posterior left ventricle. When mapping during ablation, all 3 tachycardias appear to be coming from this area   *** amio labs *** VT? *** mex still on her list??? *** xarelto, dose, bleeding, labs  Device information SJM dual chamber ICD, implanted 2003,  2007, gen changes 2013, Jan 2022 At the time of her gen change Jan 2022,  SJM Riata ST 7040 lead was fluoroscopically and electrically normal today, opted to use this lead rather than replacing it after a long discussion with the patient prior to the procedure.   VT history 2003 had syncope >> EP study with inducible PMVT + h/o PMVT 2012 with ICD shocks 04/24/21 appropriate tx for VT w/HV tx March 2023 VT >> numerous ATPs July 2024 VT (slow) July VF >> cath patent stent, significant Cx disease, too small to intervene, without obstructive CAD otherwise Sept 2024 VT >>> brought to the lab from the ER though by the time she arrived to the lab > VT had broken Dec more VT  Jan 2025 >  brought to the lab, thrombus precluded further procedure, aborted   AAD Hx 2007 amiodarone started quickly stopped 2/2 nausea Remotely as well tried on Sotalol and Multaq unclear when/why they were stopped 2012 restarted on amiodarone Sep 2024 . Retried mexiletine, low dose (reportedly previously intolerant) >> again stopped, unable to tolerate (gait instability and vision changes)   Past Medical History:  Diagnosis Date   Allergy    Arthritis    Atrial fibrillation (HCC)    controlled with amiodarone, on coumadin   Chronic renal insufficiency    Chronic systolic heart failure (HCC)    Coronary artery disease 01/31/2011   Diabetes mellitus    type 1   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    History of chicken pox    Hyperlipidemia    Hypertension    Ischemic cardiomyopathy    Lumbar spondylosis 01/11/2012   Sleep apnea    Stroke [facility]) 2010   eye doctor said she had TIA   Ventricular tachycardia (HCC)    Polymorphic    Past Surgical History:  Procedure Laterality Date   ABDOMINAL HYSTERECTOMY  1995   CARDIAC DEFIBRILLATOR PLACEMENT     CHOLECYSTECTOMY  1985   COLONOSCOPY WITH PROPOFOL N/A 02/24/2018   Procedure: COLONOSCOPY WITH PROPOFOL;  Surgeon: Rachael Fee, MD;  Location: [facility] ENDOSCOPY;  Service: Endoscopy;  Laterality: N/A;   ICD  2003/2007   implanted by Dr Alanda Amass, most recent generator change  2/13 by Dr Johney Frame, Analyze ST study patient   ICD GENERATOR CHANGEOUT N/A 04/11/2020   Procedure: ICD GENERATOR CHANGEOUT;  Surgeon: Hillis Range, MD;  Location: [facility] INVASIVE CV LAB;  Service: Cardiovascular;  Laterality: N/A;   IMPLANTABLE CARDIOVERTER DEFIBRILLATOR (ICD) GENERATOR CHANGE N/A 05/05/2011   Procedure: ICD GENERATOR CHANGE;  Surgeon: Hillis Range, MD;  Location: [facility] CATH LAB;  Service: Cardiovascular;  Laterality: N/A;   LEFT HEART CATH AND CORONARY ANGIOGRAPHY N/A 10/14/2022   Procedure: LEFT HEART CATH AND CORONARY ANGIOGRAPHY;  Surgeon:  Kathleene Hazel, MD;  Location: [facility] INVASIVE CV LAB;  Service: Cardiovascular;  Laterality: N/A;   LEFT HEART CATHETERIZATION WITH CORONARY ANGIOGRAM N/A 01/30/2011   Procedure: LEFT HEART CATHETERIZATION WITH CORONARY ANGIOGRAM;  Surgeon: Laurey Morale, MD;  Location: [facility] CATH LAB;  Service: Cardiovascular;  Laterality: N/A;   POLYPECTOMY  02/24/2018   Procedure: POLYPECTOMY;  Surgeon: Rachael Fee, MD;  Location: [facility] ENDOSCOPY;  Service: Endoscopy;;   Ma Rings  1980   V TACH ABLATION N/A 04/02/2023   Procedure: Floyce Stakes ABLATION;  Surgeon: Regan Lemming, MD;  Location: [facility] INVASIVE CV LAB;  Service: Cardiovascular;  Laterality: N/A;    Current Outpatient Medications  Medication Sig Dispense Refill   acetaminophen (TYLENOL) 325 MG tablet Take 325 mg by mouth every 6 (six) hours as needed for moderate pain.     amiodarone (PACERONE) 200 MG tablet Take 1 tablet (200 mg total) by mouth daily. 30 tablet 6   aspirin EC 81 MG tablet Take 1 tablet (81 mg total) by mouth daily. Swallow whole. 30 tablet 12   busPIRone (BUSPAR) 5 MG tablet Take 1 tablet (5 mg total) by mouth 2 (two) times daily. 60 tablet 1   carvedilol (COREG) 25 MG tablet TAKE 1 TABLET (25 MG TOTAL) BY MOUTH TWICE A DAY WITH MEALS 180 tablet 3   Cholecalciferol (VITAMIN D3) 2000 UNITS TABS Take 2,000 Units by mouth every morning.     Continuous Glucose Sensor (FREESTYLE LIBRE 3 SENSOR) MISC 1 each by Does not apply route every 14 (fourteen) days. 6 each 3   dicyclomine (BENTYL) 10 MG capsule TAKE 1 CAPSULE (10 MG TOTAL) BY MOUTH 4 (FOUR) TIMES DAILY BEFORE MEALS AND AT BEDTIME. 120 capsule 1   Dulaglutide (TRULICITY) 3 MG/0.5ML SOPN Inject 3 mg into the skin once a week. (Patient not taking: Reported on 03/12/2023) 2 mL 11   FARXIGA 10 MG TABS tablet Take 1 tablet (10 mg total) by mouth daily before breakfast. 90 tablet 3   fluticasone (FLONASE) 50 MCG/ACT nasal spray Place 1 spray into both nostrils daily as needed  for allergies or rhinitis.     furosemide (LASIX) 20 MG tablet Take 2 tablets (40 mg) by mouth once a day. 60 tablet 11   gabapentin (NEURONTIN) 100 MG capsule Take 200 mg by mouth 2 (two) times daily.     insulin aspart (NOVOLOG FLEXPEN) 100 UNIT/ML FlexPen Inject up to 15 units daily under skin as advised 15 mL 11   insulin degludec (TRESIBA FLEXTOUCH) 100 UNIT/ML FlexTouch Pen Inject 12-14 Units into the skin daily. 9 mL 3   levETIRAcetam (KEPPRA XR) 500 MG 24 hr tablet Take 1 tablet (500 mg total) by mouth at bedtime. 90 tablet 3   loperamide (IMODIUM) 2 MG capsule Take 1 capsule (2 mg total) by mouth as needed for diarrhea or loose stools. 30 capsule 0   magnesium oxide (MAG-OX) 400 (240 Mg) MG tablet TAKE 1 TABLET  BY MOUTH TWICE A DAY 180 tablet 1   mexiletine (MEXITIL) 150 MG capsule Take 1 capsule (150 mg total) by mouth every 12 (twelve) hours. (Patient not taking: Reported on 03/12/2023) 60 capsule 5   Multiple Vitamins-Minerals (EYE VITAMINS PO) Take 1 tablet by mouth 2 (two) times daily.     nitroGLYCERIN (NITROSTAT) 0.4 MG SL tablet Place 1 tablet (0.4 mg total) under the tongue every 5 (five) minutes as needed for chest pain. 25 tablet 3   omeprazole (PRILOSEC) 20 MG capsule Take 1 capsule by mouth daily.     Rivaroxaban (XARELTO) 15 MG TABS tablet Take 1 tablet (15 mg total) by mouth daily with supper. 30 tablet 6   rosuvastatin (CRESTOR) 40 MG tablet Take 1 tablet (40 mg total) by mouth daily. NEEDS FOLLOW UP APPOINTMENT FOR MORE REFILLS 90 tablet 0   sertraline (ZOLOFT) 50 MG tablet Take 50 mg by mouth at bedtime.     No current facility-administered medications for this visit.    Allergies:   Veltassa [patiromer], Darvon, Mexiletine hcl, Promethazine hcl, Ranexa [ranolazine er], Spironolactone, and Plavix [clopidogrel bisulfate]   Social History:  The patient  reports that she quit smoking about 27 years ago. Her smoking use included cigarettes. She has never been exposed to  tobacco smoke. She has never used smokeless tobacco. She reports that she does not drink alcohol and does not use drugs.   Family History:  The patient's family history includes Breast cancer in her sister; Colon cancer in her maternal aunt; Diabetes in her mother; Early death (age of onset: 55) in her brother; Heart attack in her father; Heart disease in her father and mother; Hyperlipidemia in her mother; Hypertension in her father and mother; Irritable bowel syndrome in her sister; Lung cancer in her sister; Seizures in her granddaughter.  ROS:  Please see the history of present illness. All other systems are reviewed and otherwise negative.   PHYSICAL EXAM:  VS:  There were no vitals taken for this visit. BMI: There is no height or weight on file to calculate BMI. Well nourished, well developed, in no acute distress  HEENT: normocephalic, atraumatic  Neck: no JVD, carotid bruits or masses Cardiac: *** RRR; no significant murmurs, no rubs, or gallops Lungs: *** CTA b/l, no wheezing, rhonchi or rales  Abd: soft, nontender MS: no deformity or atrophy Ext: *** no edema Skin: warm and dry, no rash Neuro:  No gross deficits appreciated Psych: euthymic mood, full affect  *** ICD site is stable and well healed.  NO skin changes, tenderness or fluctuation   EKG: not done today  ICD interrogation done today and reviewed by myself;  *** Battery and lead measurements are good ***  04/02/23: EPS CONCLUSIONS: 1. Sinus rhythm upon presentation.   2.  3D mapping of ventricular tachycardia 3.  Mobile mass noted on the posterior medial papillary muscle  10/14/22: LHC   Prox RCA to Mid RCA lesion is 20% stenosed.   Dist RCA lesion is 40% stenosed.   Dist Cx lesion is 99% stenosed.   Mid LAD lesion is 20% stenosed.   Ost LAD to Mid LAD lesion is 20% stenosed.   Mid LAD to Dist LAD lesion is 20% stenosed.   2nd Diag lesion is 50% stenosed.   3rd Diag lesion is 40% stenosed.   Ost LM lesion  is 20% stenosed.   Patent mid LAD stent with minimal restenosis.  Severe stenosis in the very distal AV groove Circumflex  artery leading into a very small obtuse marginal branch-too small for PCI Large dominant RCA with patent proximal and mid stents with minimal restenosis.  NO targets for PCI LVEDP 15 mmHg   Recommendations: Continue medical management of CAD. Continue ASA and resume Eliquis tomorrow. Given ACS, would consider adding Plavix but she has a Plavix allergy. I would not add Brilinta or Effient given advanced age and need for Eliquis.     06/15/22: TTE  1. Left ventricular ejection fraction, by estimation, is 25 to 30%. The  left ventricle has severely decreased function. The left ventricle  demonstrates global hypokinesis. Left ventricular diastolic parameters are  consistent with Grade I diastolic  dysfunction (impaired relaxation).   2. Right ventricular systolic function is normal. The right ventricular  size is normal. There is normal pulmonary artery systolic pressure.   3. Left atrial size was mildly dilated.   4. The mitral valve is normal in structure. Moderate mitral valve  regurgitation.   5. The aortic valve is grossly normal. Aortic valve regurgitation is  moderate to severe.   6. The inferior vena cava is dilated in size with >50% respiratory  variability, suggesting right atrial pressure of 8 mmHg.   06/27/2019: TTE IMPRESSIONS   1. Left ventricular ejection fraction, by estimation, is 40 to 45%. The  left ventricle has mildly decreased function. The left ventricle  demonstrates regional wall motion abnormalities, infeiror and inferoseptal  hypokinesis. There is mild left  ventricular hypertrophy. Left ventricular diastolic parameters are  consistent with Grade I diastolic dysfunction (impaired relaxation).   2. Right ventricular systolic function is normal. The right ventricular  size is normal. Tricuspid regurgitation signal is inadequate for assessing   PA pressure.   3. Left atrial size was mildly dilated.   4. The mitral valve is normal in structure. No evidence of mitral valve  regurgitation. No evidence of mitral stenosis.   5. The aortic valve is tricuspid. Aortic valve regurgitation is mild. No  aortic stenosis is present.   6. The inferior vena cava is normal in size with greater than 50%  respiratory variability, suggesting right atrial pressure of 3 mmHg.    08/16/2018: TTE IMPRESSIONS   1. The left ventricle has a visually estimated ejection fraction of 40%.  The cavity size was normal. There is moderately increased left ventricular  wall thickness. Left ventricular diastolic Doppler parameters are  consistent with impaired relaxation.  Inferior and inferoseptal severe hypokinesis.   2. The right ventricle has normal systolic function. The cavity was  normal. There is no increase in right ventricular wall thickness.   3. Left atrial size was mildly dilated.   4. There is mild to moderate mitral annular calcification present. No  evidence of mitral valve stenosis. No significant regurgitation.   5. The aortic valve is tricuspid. Moderate calcification of the aortic  valve. Aortic valve regurgitation is mild by color flow Doppler. No  stenosis of the aortic valve.   6. The aortic root is normal in size and structure.   7. Normal IVC. No complete TR doppler jet so unable to estimate PA  systolic pressure.    Recent Labs: 12/17/2022: Magnesium 2.2 01/13/2023: TSH 6.459 03/12/2023: ALT 73; B Natriuretic Peptide 124.1; Hemoglobin 13.3; Platelets 234 03/18/2023: BUN 30; Creatinine, Ser 2.55; Potassium 4.1; Sodium 141  01/13/2023: Cholesterol 127; HDL 49; LDL Cholesterol 65; Total CHOL/HDL Ratio 2.6; Triglycerides 63; VLDL 13   CrCl cannot be calculated (Patient's most recent lab result is older  than the maximum 21 days allowed.).   Wt Readings from Last 3 Encounters:  04/02/23 180 lb (81.6 kg)  03/12/23 180 lb 3.2 oz  (81.7 kg)  02/23/23 181 lb 3.2 oz (82.2 kg)     Other studies reviewed: Additional studies/records reviewed today include: summarized above  ASSESSMENT AND PLAN:  1. ICD     *** intact function     *** no programming changes made  2. ICM 3. Chronic CHF (systolic)      Follows closely with Dr. Shirlee Latch and the HF team  4. CAD      *** No symptoms of angina      Follows with Dr. Shirlee Latch  5. Paroxysmal Afib     CHA2DS2Vasc is 9, on Eliquis, *** appropriately dosed     *** % burden  6. HTN     *** No changes today  7. HLD     Not addressed today  8. Hx of PMVT     Chronic amiodarone     ***  She would like to establish with a new EP MD   Disposition: ***     Current medicines are reviewed at length with the patient today.  The patient did not have any concerns regarding medicines.  [name] Fredrickson, PA-C 05/09/2023 2:09 PM     [facility] [address] [place] [address] [phone] (office)  [phone] (fax)

## 2023-05-10 NOTE — Telephone Encounter (Signed)
Patient wrote back saying I need to mail them application again. Mailed 05/10/23

## 2023-05-11 ENCOUNTER — Other Ambulatory Visit: Payer: Self-pay

## 2023-05-12 ENCOUNTER — Ambulatory Visit: Payer: Medicare PPO | Admitting: Physician Assistant

## 2023-05-13 ENCOUNTER — Encounter (HOSPITAL_COMMUNITY): Payer: Medicare HMO

## 2023-05-19 NOTE — Progress Notes (Deleted)
 Electrophysiology Office Note:   Date:  05/19/2023  ID:  FELISHIA WARTMAN, DOB 01-08-1946, MRN [id number]  Primary Cardiologist: [name], MD Primary Heart Failure: None Electrophysiologist: Will [name], MD   {Click to update primary MD,subspecialty MD or APP then REFRESH:1}    History of Present Illness:   [name] is a 78 y.o. female with h/o HFrEF, ICM, PMVT/VF s/p ICD, CAD, HTN, paroxysmal AF, DM II, seizure disorder, hypothyroidism, CKD IIIb, seen today for routine electrophysiology follow up post VT ablation attempt (mobile papillary mass found during case/aborted).   Since last being seen in our clinic the patient reports doing ***.  she denies chest pain, palpitations, dyspnea, PND, orthopnea, nausea, vomiting, dizziness, syncope, edema, weight gain, or early satiety.   Review of systems complete and found to be negative unless listed in HPI.  EP Information / Studies Reviewed:    EKG is not ordered today. EKG from 04/02/23 reviewed which showed AV dual paced rhythm with PVC's      ICD Interrogation-  reviewed in detail today,  See PACEART report.  Device History: Abbott Dual Chamber ICD implanted 2003 for HFrEF  Generator Change: 2007, 2013, 03/2020 Leads: SJM Riata ST 7040 lead was fluoroscopically and electrically normal today, opted to use this lead rather than replacing it after a long discussion with the patient prior to the procedure. History of appropriate therapy: Yes  Studies:  LHC 09/2022 >  ECHO 12/2022 > LVEF 35-40%, global hypokinesis, GIDD, RV systolic function mildly reduced,  normal PASP, mild mitral vale regurgitation EPS 12/31/23 >thrombus, mobile mass note posteromedial papillary muscle >> procedure aborted, no ablation performed. Scar mapping showed scar in the lateral posterior left ventricle. When mapping during ablation, all 3 tachycardias appear to be coming from this area    Arrhythmia   VT  2003 > syncope, EPS with  inducible PMVT 2012 > PMVT with ICD shock  04/24/21 > VT with appropriate HV therapy 05/2021 > VT > numerous ATPs 09/2022 > VT (slow) 09/2022 > VF, LCH w/patent stent, significant Cx disease, too small to intervene, without obstructive CAD otherwise 11/2022 VT > brought to the lab from ER, by the time she arrived to lab > VT had broken 02/2023 > VT 03/2023 > brought to the lab, thrombus precluded further procedure, aborted  AAD Hx Hx remote Sotalol and Multaq unclear when/why they were stopped 2007 > amiodarone, quickly stopped 2/2 nausea 2012 restarted on amiodarone 11/2022 Retried mexiletine, low dose (reportedly previously intolerant), stopped >unable to tolerate (gait instability and vision changes)     Risk Assessment/Calculations:    CHA2DS2-VASc Score = 7  {Confirm score is correct.  If not, click here to update score.  REFRESH note.  :1} This indicates a 11.2% annual risk of stroke. The patient's score is based upon: CHF History: 1 HTN History: 1 Diabetes History: 1 Stroke History: 0 Vascular Disease History: 1 Age Score: 2 Gender Score: 1   {This patient has a significant risk of stroke if diagnosed with atrial fibrillation.  Please consider VKA or DOAC agent for anticoagulation if the bleeding risk is acceptable.   You can also use the SmartPhrase .[email or web address] for documentation.   :[id number]} No BP recorded.  {Refresh Note OR Click here to enter BP  :1}***        Physical Exam:   VS:  There were no vitals taken for this visit.   Wt Readings from Last 3 Encounters:  04/02/23 180 lb (81.6 kg)  03/12/23 180 lb 3.2 oz (81.7 kg)  02/23/23 181 lb 3.2 oz (82.2 kg)     GEN: Well nourished, well developed in no acute distress NECK: No JVD; No carotid bruits CARDIAC: {EPRHYTHM:[id number]}, no murmurs, rubs, gallops RESPIRATORY:  Clear to auscultation without rales, wheezing or rhonchi  ABDOMEN: Soft, non-tender, non-distended EXTREMITIES:  No edema; No deformity   ASSESSMENT  AND PLAN:    Chronic Systolic Dysfunction s/p Abbott dual chamber ICD  ICM, CAD, VT/VF -euvolemic today -Stable on an appropriate medical regimen -Normal ICD function -See Pace Art report -No changes today -VT zones adjusted 04/08/23 > VT1 starting at 110bpm with ATP therapy on, Max track rate lowered to 100 (see note from visit) -follows with HF Team  -continue amiodarone 200 mg daily ***  -update amio labs > CMP, TSH/T4 *** -continue coreg 25 mg BID  Mobile Mass on Posterior Medial Papillary Muscle  Noted during ablation -unable to treat with Eliquis due to cost, prior 30d card use  -recently discharged on Xarelto 15mg  daily, CrCl 13mL/min -under review for medication assistance  -assess CMP as above, review renal function ***  Paroxysmal AF CHA2DS2-VASc 7  -continue OAC for stroke prophylaxis  -monitors with ***  CKD IIIb  -repeat BMP as above -Replace electrolytes as indicated -Avoid nephrotoxic agents, ensure adequate renal perfusion  Disposition:   Follow up with Dr. Elberta Fortis {EPFOLLOW VW:[id number]}   Signed, Canary Brim, NP-C, AGACNP-BC [facility] - Electrophysiology  05/19/2023, 9:40 PM

## 2023-05-20 ENCOUNTER — Encounter (HOSPITAL_COMMUNITY): Payer: Self-pay

## 2023-05-20 ENCOUNTER — Observation Stay (HOSPITAL_COMMUNITY): Payer: Medicare PPO

## 2023-05-20 ENCOUNTER — Telehealth: Payer: Self-pay

## 2023-05-20 ENCOUNTER — Telehealth: Payer: Self-pay | Admitting: Cardiology

## 2023-05-20 ENCOUNTER — Ambulatory Visit: Payer: Medicare PPO | Admitting: Pulmonary Disease

## 2023-05-20 ENCOUNTER — Inpatient Hospital Stay (HOSPITAL_COMMUNITY)
Admission: EM | Admit: 2023-05-20 | Discharge: 2023-06-22 | DRG: 870 | Disposition: E | Payer: Medicare PPO | Attending: Internal Medicine | Admitting: Internal Medicine

## 2023-05-20 ENCOUNTER — Other Ambulatory Visit: Payer: Self-pay

## 2023-05-20 ENCOUNTER — Emergency Department (HOSPITAL_COMMUNITY): Payer: Medicare PPO

## 2023-05-20 DIAGNOSIS — Z7189 Other specified counseling: Secondary | ICD-10-CM | POA: Diagnosis not present

## 2023-05-20 DIAGNOSIS — Z7982 Long term (current) use of aspirin: Secondary | ICD-10-CM

## 2023-05-20 DIAGNOSIS — Z452 Encounter for adjustment and management of vascular access device: Secondary | ICD-10-CM | POA: Diagnosis not present

## 2023-05-20 DIAGNOSIS — N179 Acute kidney failure, unspecified: Secondary | ICD-10-CM | POA: Diagnosis present

## 2023-05-20 DIAGNOSIS — D696 Thrombocytopenia, unspecified: Secondary | ICD-10-CM | POA: Diagnosis not present

## 2023-05-20 DIAGNOSIS — E876 Hypokalemia: Secondary | ICD-10-CM | POA: Diagnosis present

## 2023-05-20 DIAGNOSIS — I4892 Unspecified atrial flutter: Secondary | ICD-10-CM | POA: Diagnosis not present

## 2023-05-20 DIAGNOSIS — I2510 Atherosclerotic heart disease of native coronary artery without angina pectoris: Secondary | ICD-10-CM | POA: Diagnosis not present

## 2023-05-20 DIAGNOSIS — I771 Stricture of artery: Secondary | ICD-10-CM | POA: Diagnosis not present

## 2023-05-20 DIAGNOSIS — E1122 Type 2 diabetes mellitus with diabetic chronic kidney disease: Secondary | ICD-10-CM | POA: Diagnosis present

## 2023-05-20 DIAGNOSIS — N183 Chronic kidney disease, stage 3 unspecified: Secondary | ICD-10-CM | POA: Diagnosis not present

## 2023-05-20 DIAGNOSIS — Z1152 Encounter for screening for COVID-19: Secondary | ICD-10-CM

## 2023-05-20 DIAGNOSIS — R54 Age-related physical debility: Secondary | ICD-10-CM | POA: Diagnosis present

## 2023-05-20 DIAGNOSIS — R0902 Hypoxemia: Secondary | ICD-10-CM | POA: Diagnosis not present

## 2023-05-20 DIAGNOSIS — Z833 Family history of diabetes mellitus: Secondary | ICD-10-CM

## 2023-05-20 DIAGNOSIS — Z7901 Long term (current) use of anticoagulants: Secondary | ICD-10-CM

## 2023-05-20 DIAGNOSIS — E8721 Acute metabolic acidosis: Secondary | ICD-10-CM | POA: Diagnosis not present

## 2023-05-20 DIAGNOSIS — K7200 Acute and subacute hepatic failure without coma: Secondary | ICD-10-CM | POA: Diagnosis not present

## 2023-05-20 DIAGNOSIS — I700 Atherosclerosis of aorta: Secondary | ICD-10-CM | POA: Diagnosis not present

## 2023-05-20 DIAGNOSIS — Z79899 Other long term (current) drug therapy: Secondary | ICD-10-CM

## 2023-05-20 DIAGNOSIS — I480 Paroxysmal atrial fibrillation: Secondary | ICD-10-CM | POA: Diagnosis present

## 2023-05-20 DIAGNOSIS — E870 Hyperosmolality and hypernatremia: Secondary | ICD-10-CM | POA: Diagnosis not present

## 2023-05-20 DIAGNOSIS — R0989 Other specified symptoms and signs involving the circulatory and respiratory systems: Secondary | ICD-10-CM | POA: Diagnosis not present

## 2023-05-20 DIAGNOSIS — I5082 Biventricular heart failure: Secondary | ICD-10-CM | POA: Diagnosis present

## 2023-05-20 DIAGNOSIS — I130 Hypertensive heart and chronic kidney disease with heart failure and stage 1 through stage 4 chronic kidney disease, or unspecified chronic kidney disease: Secondary | ICD-10-CM | POA: Diagnosis present

## 2023-05-20 DIAGNOSIS — Z8673 Personal history of transient ischemic attack (TIA), and cerebral infarction without residual deficits: Secondary | ICD-10-CM | POA: Diagnosis not present

## 2023-05-20 DIAGNOSIS — J159 Unspecified bacterial pneumonia: Secondary | ICD-10-CM | POA: Diagnosis present

## 2023-05-20 DIAGNOSIS — E875 Hyperkalemia: Secondary | ICD-10-CM | POA: Diagnosis not present

## 2023-05-20 DIAGNOSIS — E785 Hyperlipidemia, unspecified: Secondary | ICD-10-CM | POA: Diagnosis present

## 2023-05-20 DIAGNOSIS — E872 Acidosis, unspecified: Secondary | ICD-10-CM | POA: Diagnosis present

## 2023-05-20 DIAGNOSIS — E039 Hypothyroidism, unspecified: Secondary | ICD-10-CM | POA: Diagnosis present

## 2023-05-20 DIAGNOSIS — I517 Cardiomegaly: Secondary | ICD-10-CM | POA: Diagnosis not present

## 2023-05-20 DIAGNOSIS — Z4682 Encounter for fitting and adjustment of non-vascular catheter: Secondary | ICD-10-CM | POA: Diagnosis not present

## 2023-05-20 DIAGNOSIS — I509 Heart failure, unspecified: Secondary | ICD-10-CM | POA: Diagnosis not present

## 2023-05-20 DIAGNOSIS — J9602 Acute respiratory failure with hypercapnia: Secondary | ICD-10-CM | POA: Diagnosis not present

## 2023-05-20 DIAGNOSIS — R578 Other shock: Secondary | ICD-10-CM | POA: Diagnosis not present

## 2023-05-20 DIAGNOSIS — R0603 Acute respiratory distress: Secondary | ICD-10-CM | POA: Diagnosis not present

## 2023-05-20 DIAGNOSIS — R0682 Tachypnea, not elsewhere classified: Secondary | ICD-10-CM | POA: Diagnosis not present

## 2023-05-20 DIAGNOSIS — Z87891 Personal history of nicotine dependence: Secondary | ICD-10-CM

## 2023-05-20 DIAGNOSIS — R570 Cardiogenic shock: Secondary | ICD-10-CM | POA: Diagnosis not present

## 2023-05-20 DIAGNOSIS — D649 Anemia, unspecified: Secondary | ICD-10-CM | POA: Diagnosis not present

## 2023-05-20 DIAGNOSIS — N281 Cyst of kidney, acquired: Secondary | ICD-10-CM | POA: Diagnosis not present

## 2023-05-20 DIAGNOSIS — Z955 Presence of coronary angioplasty implant and graft: Secondary | ICD-10-CM

## 2023-05-20 DIAGNOSIS — I4901 Ventricular fibrillation: Secondary | ICD-10-CM | POA: Diagnosis present

## 2023-05-20 DIAGNOSIS — R6521 Severe sepsis with septic shock: Secondary | ICD-10-CM | POA: Diagnosis present

## 2023-05-20 DIAGNOSIS — R109 Unspecified abdominal pain: Secondary | ICD-10-CM | POA: Diagnosis not present

## 2023-05-20 DIAGNOSIS — I5022 Chronic systolic (congestive) heart failure: Secondary | ICD-10-CM | POA: Diagnosis present

## 2023-05-20 DIAGNOSIS — Z515 Encounter for palliative care: Secondary | ICD-10-CM

## 2023-05-20 DIAGNOSIS — I472 Ventricular tachycardia, unspecified: Secondary | ICD-10-CM | POA: Diagnosis present

## 2023-05-20 DIAGNOSIS — D638 Anemia in other chronic diseases classified elsewhere: Secondary | ICD-10-CM | POA: Diagnosis not present

## 2023-05-20 DIAGNOSIS — E669 Obesity, unspecified: Secondary | ICD-10-CM | POA: Diagnosis present

## 2023-05-20 DIAGNOSIS — I2489 Other forms of acute ischemic heart disease: Secondary | ICD-10-CM | POA: Diagnosis not present

## 2023-05-20 DIAGNOSIS — E873 Alkalosis: Secondary | ICD-10-CM | POA: Diagnosis not present

## 2023-05-20 DIAGNOSIS — G40909 Epilepsy, unspecified, not intractable, without status epilepticus: Secondary | ICD-10-CM | POA: Diagnosis present

## 2023-05-20 DIAGNOSIS — R609 Edema, unspecified: Secondary | ICD-10-CM | POA: Diagnosis not present

## 2023-05-20 DIAGNOSIS — D6869 Other thrombophilia: Secondary | ICD-10-CM | POA: Diagnosis not present

## 2023-05-20 DIAGNOSIS — Z6833 Body mass index (BMI) 33.0-33.9, adult: Secondary | ICD-10-CM

## 2023-05-20 DIAGNOSIS — Z90710 Acquired absence of both cervix and uterus: Secondary | ICD-10-CM

## 2023-05-20 DIAGNOSIS — E1142 Type 2 diabetes mellitus with diabetic polyneuropathy: Secondary | ICD-10-CM | POA: Diagnosis not present

## 2023-05-20 DIAGNOSIS — J1100 Influenza due to unidentified influenza virus with unspecified type of pneumonia: Secondary | ICD-10-CM | POA: Diagnosis not present

## 2023-05-20 DIAGNOSIS — I272 Pulmonary hypertension, unspecified: Secondary | ICD-10-CM | POA: Diagnosis present

## 2023-05-20 DIAGNOSIS — Z794 Long term (current) use of insulin: Secondary | ICD-10-CM

## 2023-05-20 DIAGNOSIS — Z950 Presence of cardiac pacemaker: Secondary | ICD-10-CM | POA: Diagnosis not present

## 2023-05-20 DIAGNOSIS — E1129 Type 2 diabetes mellitus with other diabetic kidney complication: Secondary | ICD-10-CM | POA: Diagnosis present

## 2023-05-20 DIAGNOSIS — D6949 Other primary thrombocytopenia: Secondary | ICD-10-CM | POA: Diagnosis present

## 2023-05-20 DIAGNOSIS — N1832 Chronic kidney disease, stage 3b: Secondary | ICD-10-CM | POA: Diagnosis present

## 2023-05-20 DIAGNOSIS — E119 Type 2 diabetes mellitus without complications: Secondary | ICD-10-CM | POA: Diagnosis not present

## 2023-05-20 DIAGNOSIS — M545 Low back pain, unspecified: Secondary | ICD-10-CM | POA: Diagnosis present

## 2023-05-20 DIAGNOSIS — A419 Sepsis, unspecified organism: Principal | ICD-10-CM | POA: Diagnosis present

## 2023-05-20 DIAGNOSIS — L89156 Pressure-induced deep tissue damage of sacral region: Secondary | ICD-10-CM | POA: Diagnosis not present

## 2023-05-20 DIAGNOSIS — Q211 Atrial septal defect, unspecified: Secondary | ICD-10-CM | POA: Diagnosis not present

## 2023-05-20 DIAGNOSIS — N1831 Chronic kidney disease, stage 3a: Secondary | ICD-10-CM

## 2023-05-20 DIAGNOSIS — E1165 Type 2 diabetes mellitus with hyperglycemia: Secondary | ICD-10-CM | POA: Diagnosis not present

## 2023-05-20 DIAGNOSIS — J9690 Respiratory failure, unspecified, unspecified whether with hypoxia or hypercapnia: Secondary | ICD-10-CM | POA: Diagnosis not present

## 2023-05-20 DIAGNOSIS — Z7985 Long-term (current) use of injectable non-insulin antidiabetic drugs: Secondary | ICD-10-CM

## 2023-05-20 DIAGNOSIS — J80 Acute respiratory distress syndrome: Secondary | ICD-10-CM | POA: Diagnosis not present

## 2023-05-20 DIAGNOSIS — N2889 Other specified disorders of kidney and ureter: Secondary | ICD-10-CM | POA: Diagnosis not present

## 2023-05-20 DIAGNOSIS — J929 Pleural plaque without asbestos: Secondary | ICD-10-CM | POA: Diagnosis not present

## 2023-05-20 DIAGNOSIS — J9601 Acute respiratory failure with hypoxia: Secondary | ICD-10-CM | POA: Diagnosis present

## 2023-05-20 DIAGNOSIS — F32A Depression, unspecified: Secondary | ICD-10-CM | POA: Diagnosis present

## 2023-05-20 DIAGNOSIS — I255 Ischemic cardiomyopathy: Secondary | ICD-10-CM | POA: Diagnosis present

## 2023-05-20 DIAGNOSIS — I5042 Chronic combined systolic (congestive) and diastolic (congestive) heart failure: Secondary | ICD-10-CM | POA: Diagnosis not present

## 2023-05-20 DIAGNOSIS — Z9049 Acquired absence of other specified parts of digestive tract: Secondary | ICD-10-CM

## 2023-05-20 DIAGNOSIS — I4891 Unspecified atrial fibrillation: Secondary | ICD-10-CM | POA: Diagnosis not present

## 2023-05-20 DIAGNOSIS — J1008 Influenza due to other identified influenza virus with other specified pneumonia: Secondary | ICD-10-CM | POA: Diagnosis present

## 2023-05-20 DIAGNOSIS — E861 Hypovolemia: Secondary | ICD-10-CM | POA: Diagnosis present

## 2023-05-20 DIAGNOSIS — J101 Influenza due to other identified influenza virus with other respiratory manifestations: Secondary | ICD-10-CM

## 2023-05-20 DIAGNOSIS — Z885 Allergy status to narcotic agent status: Secondary | ICD-10-CM

## 2023-05-20 DIAGNOSIS — E874 Mixed disorder of acid-base balance: Secondary | ICD-10-CM | POA: Diagnosis present

## 2023-05-20 DIAGNOSIS — M48061 Spinal stenosis, lumbar region without neurogenic claudication: Secondary | ICD-10-CM | POA: Diagnosis present

## 2023-05-20 DIAGNOSIS — R1111 Vomiting without nausea: Secondary | ICD-10-CM | POA: Diagnosis not present

## 2023-05-20 DIAGNOSIS — I10 Essential (primary) hypertension: Secondary | ICD-10-CM | POA: Diagnosis present

## 2023-05-20 DIAGNOSIS — I5023 Acute on chronic systolic (congestive) heart failure: Secondary | ICD-10-CM | POA: Diagnosis not present

## 2023-05-20 DIAGNOSIS — R1084 Generalized abdominal pain: Secondary | ICD-10-CM | POA: Diagnosis not present

## 2023-05-20 DIAGNOSIS — N189 Chronic kidney disease, unspecified: Secondary | ICD-10-CM | POA: Diagnosis present

## 2023-05-20 DIAGNOSIS — S301XXA Contusion of abdominal wall, initial encounter: Secondary | ICD-10-CM | POA: Diagnosis not present

## 2023-05-20 DIAGNOSIS — N170 Acute kidney failure with tubular necrosis: Secondary | ICD-10-CM | POA: Diagnosis not present

## 2023-05-20 DIAGNOSIS — Z66 Do not resuscitate: Secondary | ICD-10-CM | POA: Diagnosis present

## 2023-05-20 DIAGNOSIS — K219 Gastro-esophageal reflux disease without esophagitis: Secondary | ICD-10-CM | POA: Diagnosis present

## 2023-05-20 DIAGNOSIS — Z95810 Presence of automatic (implantable) cardiac defibrillator: Secondary | ICD-10-CM

## 2023-05-20 DIAGNOSIS — R918 Other nonspecific abnormal finding of lung field: Secondary | ICD-10-CM | POA: Diagnosis not present

## 2023-05-20 DIAGNOSIS — I959 Hypotension, unspecified: Secondary | ICD-10-CM | POA: Diagnosis not present

## 2023-05-20 DIAGNOSIS — G9341 Metabolic encephalopathy: Secondary | ICD-10-CM | POA: Diagnosis not present

## 2023-05-20 DIAGNOSIS — N184 Chronic kidney disease, stage 4 (severe): Secondary | ICD-10-CM | POA: Diagnosis not present

## 2023-05-20 DIAGNOSIS — J09X2 Influenza due to identified novel influenza A virus with other respiratory manifestations: Secondary | ICD-10-CM | POA: Diagnosis not present

## 2023-05-20 DIAGNOSIS — G9340 Encephalopathy, unspecified: Secondary | ICD-10-CM | POA: Diagnosis not present

## 2023-05-20 DIAGNOSIS — Z888 Allergy status to other drugs, medicaments and biological substances status: Secondary | ICD-10-CM

## 2023-05-20 DIAGNOSIS — I5020 Unspecified systolic (congestive) heart failure: Secondary | ICD-10-CM | POA: Diagnosis not present

## 2023-05-20 DIAGNOSIS — Z8249 Family history of ischemic heart disease and other diseases of the circulatory system: Secondary | ICD-10-CM

## 2023-05-20 LAB — I-STAT VENOUS BLOOD GAS, ED
Acid-base deficit: 2 mmol/L (ref 0.0–2.0)
Acid-base deficit: 6 mmol/L — ABNORMAL HIGH (ref 0.0–2.0)
Bicarbonate: 23.6 mmol/L (ref 20.0–28.0)
Bicarbonate: 21.6 mmol/L (ref 20.0–28.0)
Calcium, Ion: 1.03 mmol/L — ABNORMAL LOW (ref 1.15–1.40)
Calcium, Ion: 1.06 mmol/L — ABNORMAL LOW (ref 1.15–1.40)
HCT: 39 % (ref 36.0–46.0)
HCT: 36 % (ref 36.0–46.0)
Hemoglobin: 13.3 g/dL (ref 12.0–15.0)
Hemoglobin: 12.2 g/dL (ref 12.0–15.0)
O2 Saturation: 45 %
O2 Saturation: 90 %
Potassium: 3.8 mmol/L (ref 3.5–5.1)
Potassium: 4.3 mmol/L (ref 3.5–5.1)
Sodium: 139 mmol/L (ref 135–145)
Sodium: 139 mmol/L (ref 135–145)
TCO2: 25 mmol/L (ref 22–32)
TCO2: 23 mmol/L (ref 22–32)
pCO2, Ven: 40.7 mm[Hg] — ABNORMAL LOW (ref 44–60)
pCO2, Ven: 48.7 mm[Hg] (ref 44–60)
pH, Ven: 7.371 (ref 7.25–7.43)
pH, Ven: 7.256 (ref 7.25–7.43)
pO2, Ven: 26 mm[Hg] — CL (ref 32–45)
pO2, Ven: 69 mm[Hg] — ABNORMAL HIGH (ref 32–45)

## 2023-05-20 LAB — LIPASE, BLOOD: Lipase: 24 U/L (ref 11–51)

## 2023-05-20 LAB — I-STAT CHEM 8, ED
BUN: 41 mg/dL — ABNORMAL HIGH (ref 8–23)
Calcium, Ion: 1.01 mmol/L — ABNORMAL LOW (ref 1.15–1.40)
Chloride: 104 mmol/L (ref 98–111)
Creatinine, Ser: 2.6 mg/dL — ABNORMAL HIGH (ref 0.44–1.00)
Glucose, Bld: 221 mg/dL — ABNORMAL HIGH (ref 70–99)
HCT: 39 % (ref 36.0–46.0)
Hemoglobin: 13.3 g/dL (ref 12.0–15.0)
Potassium: 3.9 mmol/L (ref 3.5–5.1)
Sodium: 140 mmol/L (ref 135–145)
TCO2: 23 mmol/L (ref 22–32)

## 2023-05-20 LAB — COMPREHENSIVE METABOLIC PANEL
ALT: 663 U/L — ABNORMAL HIGH (ref 0–44)
AST: 1076 U/L — ABNORMAL HIGH (ref 15–41)
Albumin: 2.5 g/dL — ABNORMAL LOW (ref 3.5–5.0)
Alkaline Phosphatase: 59 U/L (ref 38–126)
Anion gap: 17 — ABNORMAL HIGH (ref 5–15)
BUN: 32 mg/dL — ABNORMAL HIGH (ref 8–23)
CO2: 18 mmol/L — ABNORMAL LOW (ref 22–32)
Calcium: 7.9 mg/dL — ABNORMAL LOW (ref 8.9–10.3)
Chloride: 103 mmol/L (ref 98–111)
Creatinine, Ser: 2.37 mg/dL — ABNORMAL HIGH (ref 0.44–1.00)
GFR, Estimated: 21 mL/min — ABNORMAL LOW (ref >60)
Glucose, Bld: 201 mg/dL — ABNORMAL HIGH (ref 70–99)
Potassium: 3.2 mmol/L — ABNORMAL LOW (ref 3.5–5.1)
Sodium: 138 mmol/L (ref 135–145)
Total Bilirubin: 1.9 mg/dL — ABNORMAL HIGH (ref 0.0–1.2)
Total Protein: 6 g/dL — ABNORMAL LOW (ref 6.5–8.1)

## 2023-05-20 LAB — CBC
HCT: 40.3 % (ref 36.0–46.0)
Hemoglobin: 13 g/dL (ref 12.0–15.0)
MCH: 28.7 pg (ref 26.0–34.0)
MCHC: 32.3 g/dL (ref 30.0–36.0)
MCV: 89 fL (ref 80.0–100.0)
Platelets: 142 10*3/uL — ABNORMAL LOW (ref 150–400)
RBC: 4.53 MIL/uL (ref 3.87–5.11)
RDW: 16.9 % — ABNORMAL HIGH (ref 11.5–15.5)
WBC: 8.7 10*3/uL (ref 4.0–10.5)
nRBC: 0 % (ref 0.0–0.2)

## 2023-05-20 LAB — CBG MONITORING, ED: Glucose-Capillary: 201 mg/dL — ABNORMAL HIGH (ref 70–99)

## 2023-05-20 LAB — RESP PANEL BY RT-PCR (RSV, FLU A&B, COVID)  RVPGX2
Influenza A by PCR: POSITIVE — AB
Influenza B by PCR: NEGATIVE
Resp Syncytial Virus by PCR: NEGATIVE
SARS Coronavirus 2 by RT PCR: NEGATIVE

## 2023-05-20 LAB — MAGNESIUM: Magnesium: 1.6 mg/dL — ABNORMAL LOW (ref 1.7–2.4)

## 2023-05-20 LAB — HEMOGLOBIN A1C
Hgb A1c MFr Bld: 7.7 % — ABNORMAL HIGH (ref 4.8–5.6)
Mean Plasma Glucose: 174.29 mg/dL

## 2023-05-20 LAB — TROPONIN I (HIGH SENSITIVITY)
Troponin I (High Sensitivity): 339 ng/L (ref <18)
Troponin I (High Sensitivity): 417 ng/L (ref <18)

## 2023-05-20 LAB — BRAIN NATRIURETIC PEPTIDE: B Natriuretic Peptide: 2548.1 pg/mL — ABNORMAL HIGH (ref 0.0–100.0)

## 2023-05-20 LAB — PROCALCITONIN: Procalcitonin: 44.56 ng/mL

## 2023-05-20 MED ORDER — IPRATROPIUM-ALBUTEROL 0.5-2.5 (3) MG/3ML IN SOLN
3.0000 mL | Freq: Four times a day (QID) | RESPIRATORY_TRACT | Status: DC | PRN
  Administered 2023-05-30: 3 mL via RESPIRATORY_TRACT
  Filled 2023-05-20: qty 3

## 2023-05-20 MED ORDER — ORAL CARE MOUTH RINSE: 15.0000 mL | OROMUCOSAL | Status: DC | PRN

## 2023-05-20 MED ORDER — PANTOPRAZOLE SODIUM 40 MG PO TBEC: 40.0000 mg | DELAYED_RELEASE_TABLET | Freq: Every day | ORAL | Status: DC

## 2023-05-20 MED ORDER — GABAPENTIN 100 MG PO CAPS
200.0000 mg | ORAL_CAPSULE | Freq: Two times a day (BID) | ORAL | Status: DC
  Administered 2023-05-20: 200 mg via ORAL
  Filled 2023-05-20: qty 2

## 2023-05-20 MED ORDER — IPRATROPIUM-ALBUTEROL 0.5-2.5 (3) MG/3ML IN SOLN
3.0000 mL | Freq: Four times a day (QID) | RESPIRATORY_TRACT | Status: DC
  Administered 2023-05-21 – 2023-06-04 (×56): 3 mL via RESPIRATORY_TRACT
  Filled 2023-05-20 (×57): qty 3

## 2023-05-20 MED ORDER — MORPHINE SULFATE (PF) 2 MG/ML IV SOLN: 1.0000 mg | INTRAVENOUS | Status: AC | PRN

## 2023-05-20 MED ORDER — AMIODARONE LOAD VIA INFUSION: 150.0000 mg | Freq: Once | INTRAVENOUS | Status: DC

## 2023-05-20 MED ORDER — SODIUM CHLORIDE 0.9 % IV SOLN
2.0000 g | Freq: Once | INTRAVENOUS | Status: AC
  Administered 2023-05-20: 2 g via INTRAVENOUS
  Filled 2023-05-20: qty 20

## 2023-05-20 MED ORDER — ETOMIDATE 2 MG/ML IV SOLN
INTRAVENOUS | Status: AC | PRN
  Administered 2023-05-20: 20 mg via INTRAVENOUS

## 2023-05-20 MED ORDER — MORPHINE SULFATE (PF) 2 MG/ML IV SOLN
2.0000 mg | Freq: Once | INTRAVENOUS | Status: AC
  Administered 2023-05-20: 2 mg via INTRAVENOUS
  Filled 2023-05-20: qty 1

## 2023-05-20 MED ORDER — SODIUM CHLORIDE 0.9 % IV SOLN
100.0000 mg | INTRAVENOUS | Status: AC
  Administered 2023-05-20: 100 mg via INTRAVENOUS
  Filled 2023-05-20: qty 100

## 2023-05-20 MED ORDER — OSELTAMIVIR PHOSPHATE 30 MG PO CAPS
30.0000 mg | ORAL_CAPSULE | Freq: Every day | ORAL | Status: DC
Start: 2023-05-21 — End: 2023-05-25
  Filled 2023-05-20: qty 1

## 2023-05-20 MED ORDER — AMIODARONE HCL IN DEXTROSE 360-4.14 MG/200ML-% IV SOLN: 30.0000 mg/h | INTRAVENOUS | Status: DC

## 2023-05-20 MED ORDER — BUSPIRONE HCL 10 MG PO TABS
5.0000 mg | ORAL_TABLET | Freq: Two times a day (BID) | ORAL | Status: DC
  Administered 2023-05-20: 5 mg via ORAL
  Filled 2023-05-20: qty 1

## 2023-05-20 MED ORDER — ACETAMINOPHEN 650 MG RE SUPP: 650.0000 mg | Freq: Four times a day (QID) | RECTAL | Status: DC | PRN

## 2023-05-20 MED ORDER — FAMOTIDINE IN NACL 20-0.9 MG/50ML-% IV SOLN
20.0000 mg | Freq: Two times a day (BID) | INTRAVENOUS | Status: DC
  Administered 2023-05-21: 20 mg via INTRAVENOUS
  Filled 2023-05-20: qty 50

## 2023-05-20 MED ORDER — LACTATED RINGERS IV BOLUS
1000.0000 mL | Freq: Once | INTRAVENOUS | Status: AC
  Administered 2023-05-20: 1000 mL via INTRAVENOUS

## 2023-05-20 MED ORDER — INSULIN GLARGINE 100 UNIT/ML ~~LOC~~ SOLN
10.0000 [IU] | Freq: Every day | SUBCUTANEOUS | Status: DC
  Filled 2023-05-20: qty 0.1

## 2023-05-20 MED ORDER — LEVETIRACETAM ER 500 MG PO TB24
500.0000 mg | ORAL_TABLET | Freq: Every day | ORAL | Status: DC
  Administered 2023-05-20: 500 mg via ORAL
  Filled 2023-05-20: qty 1

## 2023-05-20 MED ORDER — OSELTAMIVIR PHOSPHATE 30 MG PO CAPS
30.0000 mg | ORAL_CAPSULE | ORAL | Status: AC
  Administered 2023-05-20: 30 mg via ORAL
  Filled 2023-05-20: qty 1

## 2023-05-20 MED ORDER — LACTATED RINGERS IV BOLUS: 1000.0000 mL | Freq: Once | INTRAVENOUS | Status: DC

## 2023-05-20 MED ORDER — PHENYLEPHRINE 80 MCG/ML (10ML) SYRINGE FOR IV PUSH (FOR BLOOD PRESSURE SUPPORT)
PREFILLED_SYRINGE | INTRAVENOUS | Status: AC | PRN
  Administered 2023-05-20 (×3): 160 ug via INTRAVENOUS

## 2023-05-20 MED ORDER — FENTANYL BOLUS VIA INFUSION
25.0000 ug | INTRAVENOUS | Status: DC | PRN
  Administered 2023-05-20 (×2): 25 ug via INTRAVENOUS
  Administered 2023-05-21: 50 ug via INTRAVENOUS
  Administered 2023-05-21: 75 ug via INTRAVENOUS
  Administered 2023-05-21: 50 ug via INTRAVENOUS
  Administered 2023-05-21: 75 ug via INTRAVENOUS
  Administered 2023-05-22 – 2023-05-23 (×3): 100 ug via INTRAVENOUS
  Administered 2023-05-23 – 2023-05-24 (×5): 50 ug via INTRAVENOUS

## 2023-05-20 MED ORDER — RIVAROXABAN 15 MG PO TABS
15.0000 mg | ORAL_TABLET | Freq: Every day | ORAL | Status: DC
  Administered 2023-05-20: 15 mg via ORAL
  Filled 2023-05-20: qty 1

## 2023-05-20 MED ORDER — INSULIN ASPART 100 UNIT/ML IJ SOLN: 0.0000 [IU] | Freq: Three times a day (TID) | INTRAMUSCULAR | Status: DC

## 2023-05-20 MED ORDER — SERTRALINE HCL 50 MG PO TABS
50.0000 mg | ORAL_TABLET | Freq: Every day | ORAL | Status: DC
  Administered 2023-05-20: 50 mg via ORAL
  Filled 2023-05-20: qty 1

## 2023-05-20 MED ORDER — AMIODARONE HCL IN DEXTROSE 360-4.14 MG/200ML-% IV SOLN
60.0000 mg/h | INTRAVENOUS | Status: AC
  Administered 2023-05-20 (×2): 60 mg/h via INTRAVENOUS
  Filled 2023-05-20: qty 200

## 2023-05-20 MED ORDER — ORAL CARE MOUTH RINSE: 15.0000 mL | OROMUCOSAL | Status: DC

## 2023-05-20 MED ORDER — NOREPINEPHRINE 4 MG/250ML-% IV SOLN
0.0000 ug/min | INTRAVENOUS | Status: DC
  Administered 2023-05-20: 15 ug/min via INTRAVENOUS
  Administered 2023-05-21 (×2): 30 ug/min via INTRAVENOUS
  Administered 2023-05-21: 22 ug/min via INTRAVENOUS
  Filled 2023-05-20 (×3): qty 250

## 2023-05-20 MED ORDER — POLYETHYLENE GLYCOL 3350 17 G PO PACK: 17.0000 g | PACK | Freq: Every day | ORAL | Status: DC | PRN

## 2023-05-20 MED ORDER — MAGNESIUM SULFATE 2 GM/50ML IV SOLN
2.0000 g | Freq: Once | INTRAVENOUS | Status: AC
  Administered 2023-05-20: 2 g via INTRAVENOUS
  Filled 2023-05-20: qty 50

## 2023-05-20 MED ORDER — ROSUVASTATIN CALCIUM 20 MG PO TABS
40.0000 mg | ORAL_TABLET | Freq: Every day | ORAL | Status: DC
  Administered 2023-05-20: 40 mg via ORAL
  Filled 2023-05-20: qty 2

## 2023-05-20 MED ORDER — SODIUM CHLORIDE 0.9% FLUSH
3.0000 mL | Freq: Two times a day (BID) | INTRAVENOUS | Status: DC
  Administered 2023-05-20: 3 mL via INTRAVENOUS

## 2023-05-20 MED ORDER — ROCURONIUM BROMIDE 10 MG/ML (PF) SYRINGE
PREFILLED_SYRINGE | INTRAVENOUS | Status: AC | PRN
  Administered 2023-05-20: 100 mg via INTRAVENOUS

## 2023-05-20 MED ORDER — FENTANYL 2500MCG IN NS 250ML (10MCG/ML) PREMIX INFUSION
25.0000 ug/h | INTRAVENOUS | Status: DC
  Administered 2023-05-20: 25 ug/h via INTRAVENOUS
  Administered 2023-05-22: 100 ug/h via INTRAVENOUS
  Administered 2023-05-23: 125 ug/h via INTRAVENOUS
  Administered 2023-05-24: 100 ug/h via INTRAVENOUS
  Filled 2023-05-20 (×4): qty 250

## 2023-05-20 MED ORDER — AMIODARONE HCL IN DEXTROSE 360-4.14 MG/200ML-% IV SOLN
30.0000 mg/h | INTRAVENOUS | Status: DC
Start: 2023-05-20 — End: 2023-05-28
  Administered 2023-05-20 – 2023-05-28 (×16): 30 mg/h via INTRAVENOUS
  Filled 2023-05-20 (×12): qty 200
  Filled 2023-05-20: qty 400
  Filled 2023-05-20 (×2): qty 200

## 2023-05-20 MED ORDER — ACETAMINOPHEN 325 MG PO TABS: 650.0000 mg | ORAL_TABLET | Freq: Four times a day (QID) | ORAL | Status: DC | PRN

## 2023-05-20 MED ORDER — AMIODARONE HCL IN DEXTROSE 360-4.14 MG/200ML-% IV SOLN: 60.0000 mg/h | INTRAVENOUS | Status: DC

## 2023-05-20 MED ORDER — FAMOTIDINE 20 MG PO TABS: 20.0000 mg | ORAL_TABLET | Freq: Two times a day (BID) | ORAL | Status: DC

## 2023-05-20 MED ORDER — AMIODARONE LOAD VIA INFUSION
150.0000 mg | Freq: Once | INTRAVENOUS | Status: AC
  Administered 2023-05-20: 150 mg via INTRAVENOUS
  Filled 2023-05-20: qty 83.34

## 2023-05-20 MED ORDER — ASPIRIN 81 MG PO TBEC: 81.0000 mg | DELAYED_RELEASE_TABLET | Freq: Every day | ORAL | Status: DC

## 2023-05-20 MED ORDER — POTASSIUM CHLORIDE CRYS ER 20 MEQ PO TBCR
40.0000 meq | EXTENDED_RELEASE_TABLET | ORAL | Status: AC
  Administered 2023-05-20 (×2): 40 meq via ORAL
  Filled 2023-05-20: qty 2

## 2023-05-20 NOTE — ED Notes (Signed)
 Difficult venipuncture X2 for Blood culture.  Only one collected;RN

## 2023-05-20 NOTE — Code Documentation (Signed)
Time out

## 2023-05-20 NOTE — ED Triage Notes (Signed)
 Patient coming from home. Patient has been sick for 3 days. Patient started having abdominal pain, nausea and vomiting. Patient also states that her defibrillator has fired 3 times this am [id number] of fluid given by ems

## 2023-05-20 NOTE — ED Provider Notes (Signed)
  Physical Exam  BP 112/60   Pulse 65   Temp 98 F (36.7 C)   Resp (!) 38   Ht 5\' 2"  (1.575 m)   Wt 82.6 kg   SpO2 94%   BMI 33.29 kg/m   Physical Exam  Procedures  Procedures  ED Course / MDM   Clinical Course as of 05/20/23 1837  Thu May 20, 2023  1311 Patient has h/o intermittent VT and had ablation on 04/02/23. She has intermittent VT noted on monitor here.  [HN]  1327 Received call froM [employer] representative. Shocked x3 today for VF. She also has Afib going on and slow VT.  After several tries, obtained 20g IV access in L forearm. Patient hypotensive to 80s systolic. Receiving 1L IVF now and awaiting labs. Will consult w/ cardiology. [HN]  1328 CBC(!) No leukocytosis, new mild thrombocytopenia [HN]  1413 Influenza A By PCR(!): POSITIVE +influenza A [HN]  1415 Creatinine(!): 2.60 At her approximate baseline [HN]  1432 D/w cards who will come to see the patient. [HN]  1432 Troponin I (High Sensitivity)(!!): 339 Elevated trop not unexpected [HN]  1505 Assumed care. 78 yo F with hx of CKD, CHF, and VT sp ICD who presented with abdominal pain NV and URI symptoms. But got shocked 3x by [facility] jude ICD for VF. Also has had NSVT and AF. Was initially hypotensive when she arrived. Now is hypoxic on 4L [place]. Will start on amiodarone per cardiology recommendations.  [RP]  1537 Creatinine(!): 2.37 At baseline [RP]  1547 Patient has elevated LFTs.  Denies heavy alcohol use or Tylenol use.  No right upper quadrant tenderness to palpation or pain. [RP]  1548 Calcium(!): 7.9 Corrects to 9.1 [RP]  1835 Dr Alinda Money from hospitalist will admit to the stepdown unit.  Chest x-ray with right-sided pneumonia and she was given ceftriaxone and doxycycline because of her prolonged QT.  Magnesium is low so we will replenish at this time.  Troponins elevated but likely due to demand.  Patient has remained stable on 5 L nasal cannula. [RP]    Clinical Course User Index [HN] Loetta Rough, MD [RP]  Rondel Baton, MD   Medical Decision Making Amount and/or Complexity of Data Reviewed Labs: ordered. Decision-making details documented in ED Course. Radiology: ordered.  Risk Prescription drug management. Decision regarding hospitalization.   CRITICAL CARE Performed by: Rondel Baton   Total critical care time: 30 minutes  Critical care time was exclusive of separately billable procedures and treating other patients.  Critical care was necessary to treat or prevent imminent or life-threatening deterioration.  Critical care was time spent personally by me on the following activities: development of treatment plan with patient and/or surrogate as well as nursing, discussions with consultants, evaluation of patient's response to treatment, examination of patient, obtaining history from patient or surrogate, ordering and performing treatments and interventions, ordering and review of laboratory studies, ordering and review of radiographic studies, pulse oximetry and re-evaluation of patient's condition.    Rondel Baton, MD 05/20/23 [phone]

## 2023-05-20 NOTE — ED Provider Notes (Signed)
 [place] EMERGENCY DEPARTMENT AT [facility] Provider Note   CSN: [id number] Arrival date & time: 05/20/23  1158     History {Add pertinent medical, surgical, social history, OB history to HPI:1} Chief Complaint  Patient presents with   Abdominal Pain    ADDELYNN [name] is a 78 y.o. female with PMH as listed below who presents with ***.    Past Medical History:  Diagnosis Date   Allergy    Arthritis    Atrial fibrillation (HCC)    controlled with amiodarone, on coumadin   Chronic renal insufficiency    Chronic systolic heart failure (HCC)    Coronary artery disease 01/31/2011   Diabetes mellitus    type 1   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    History of chicken pox    Hyperlipidemia    Hypertension    Ischemic cardiomyopathy    Lumbar spondylosis 01/11/2012   Sleep apnea    Stroke [facility]) 2010   eye doctor said she had TIA   Ventricular tachycardia (HCC)    Polymorphic       Home Medications Prior to Admission medications   Medication Sig Start Date End Date Taking? Authorizing Provider  acetaminophen (TYLENOL) 325 MG tablet Take 325 mg by mouth every 6 (six) hours as needed for moderate pain.    [provider]  amiodarone (PACERONE) 200 MG tablet Take 1 tablet (200 mg total) by mouth daily. 11/15/22   Jeanella Craze, NP  aspirin EC 81 MG tablet Take 1 tablet (81 mg total) by mouth daily. Swallow whole. 10/17/22   Jeanella Craze, NP  busPIRone (BUSPAR) 5 MG tablet Take 1 tablet (5 mg total) by mouth 2 (two) times daily. 05/20/21   Jacklynn Ganong, FNP  carvedilol (COREG) 25 MG tablet TAKE 1 TABLET (25 MG TOTAL) BY MOUTH TWICE A DAY WITH MEALS 04/15/23   Camnitz, Andree Coss, MD  Cholecalciferol (VITAMIN D3) 2000 UNITS TABS Take 2,000 Units by mouth every morning.    [provider]  Continuous Glucose Sensor (FREESTYLE LIBRE 3 SENSOR) MISC 1 each by Does not apply route every 14 (fourteen) days. 11/19/22   Carlus Pavlov, MD  dicyclomine (BENTYL) 10 MG capsule TAKE 1 CAPSULE (10 MG TOTAL) BY MOUTH 4 (FOUR) TIMES DAILY BEFORE MEALS AND AT BEDTIME. 02/09/20   Corwin Levins, MD  Dulaglutide (TRULICITY) 3 MG/0.5ML SOPN Inject 3 mg into the skin once a week. Patient not taking: Reported on 03/12/2023 03/13/22   Carlus Pavlov, MD  FARXIGA 10 MG TABS tablet Take 1 tablet (10 mg total) by mouth daily before breakfast. 01/13/23   Laurey Morale, MD  fluticasone [facility]) 50 MCG/ACT nasal spray Place 1 spray into both nostrils daily as needed for allergies or rhinitis. 03/13/19 07/15/23  [provider]  furosemide (LASIX) 20 MG tablet Take 2 tablets (40 mg) by mouth once a day. 03/12/23   Laurey Morale, MD  gabapentin (NEURONTIN) 100 MG capsule Take 200 mg by mouth 2 (two) times daily.    [provider]  insulin aspart (NOVOLOG FLEXPEN) 100 UNIT/ML FlexPen Inject up to 15 units daily under skin as advised 06/24/22   Carlus Pavlov, MD  insulin degludec (TRESIBA FLEXTOUCH) 100 UNIT/ML FlexTouch Pen Inject 12-14 Units into the skin daily. 01/21/23   Carlus Pavlov, MD  levETIRAcetam (KEPPRA XR) 500 MG 24 hr tablet Take 1 tablet (500 mg total) by mouth at bedtime. 12/22/22 12/17/23  Windell Norfolk,  MD  loperamide (IMODIUM) 2 MG capsule Take 1 capsule (2 mg total) by mouth as needed for diarrhea or loose stools. 10/06/22   Noralee Stain, DO  magnesium oxide (MAG-OX) 400 (240 Mg) MG tablet TAKE 1 TABLET BY MOUTH TWICE A DAY 12/19/21   Corwin Levins, MD  mexiletine (MEXITIL) 150 MG capsule Take 1 capsule (150 mg total) by mouth every 12 (twelve) hours. Patient not taking: Reported on 03/12/2023 12/09/22   Sheilah Pigeon, PA-C  Multiple Vitamins-Minerals (EYE VITAMINS PO) Take 1 tablet by mouth 2 (two) times daily.    [provider]  nitroGLYCERIN (NITROSTAT) 0.4 MG SL tablet Place 1 tablet (0.4 mg total) under the tongue every 5 (five) minutes as needed for chest pain. 12/09/22    Sheilah Pigeon, PA-C  omeprazole (PRILOSEC) 20 MG capsule Take 1 capsule by mouth daily. 10/15/22   [provider]  Rivaroxaban (XARELTO) 15 MG TABS tablet Take 1 tablet (15 mg total) by mouth daily with supper. 04/02/23   Jeanella Craze, NP  rosuvastatin (CRESTOR) 40 MG tablet Take 1 tablet (40 mg total) by mouth daily. NEEDS FOLLOW UP APPOINTMENT FOR MORE REFILLS 09/17/22   Laurey Morale, MD  sertraline (ZOLOFT) 50 MG tablet Take 50 mg by mouth at bedtime. 09/17/22   [provider]      Allergies    Veltassa [patiromer], Darvon, Mexiletine hcl, Promethazine hcl, Ranexa [ranolazine er], Spironolactone, and Plavix [clopidogrel bisulfate]    Review of Systems   Review of Systems A 10 point review of systems was performed and is negative unless otherwise reported in HPI.  Physical Exam Updated Vital Signs BP 100/64 (BP Location: Right Arm)   Pulse 78   Temp 99.2 F (37.3 C) (Oral)   Resp (!) 22   Ht 5\' 2"  (1.575 m)   Wt 82.6 kg   SpO2 93%   BMI 33.29 kg/m  Physical Exam General: Normal appearing {Desc; female/female:11659}, lying in bed.  HEENT: PERRLA, Sclera anicteric, MMM, trachea midline.  Cardiology: RRR, no murmurs/rubs/gallops. BL radial and DP pulses equal bilaterally.  Resp: Normal respiratory rate and effort. CTAB, no wheezes, rhonchi, crackles.  Abd: Soft, non-tender, non-distended. No rebound tenderness or guarding.  GU: Deferred. MSK: No peripheral edema or signs of trauma. Extremities without deformity or TTP. No cyanosis or clubbing. Skin: warm, dry. No rashes or lesions. Back: No CVA tenderness Neuro: A&Ox4, CNs II-XII grossly intact. MAEs. Sensation grossly intact.  Psych: Normal mood and affect.   ED Results / Procedures / Treatments   Labs (all labs ordered are listed, but only abnormal results are displayed) Labs Reviewed - No data to display  EKG None  Radiology No results found.  Procedures Procedures  {Document cardiac  monitor, telemetry assessment procedure when appropriate:1}  Medications Ordered in ED Medications - No data to display  ED Course/ Medical Decision Making/ A&P                          Medical Decision Making Amount and/or Complexity of Data Reviewed Labs: ordered. Decision-making details documented in ED Course. Radiology: ordered.  Risk Prescription drug management. Decision regarding hospitalization.    This patient presents to the ED for concern of ***, this involves an extensive number of treatment options, and is a complaint that carries with it a high risk of complications and morbidity.  I considered the following differential and admission for this acute, potentially life threatening condition.  MDM:    ***  Clinical Course as of 05/31/23 0306  Thu May 20, 2023  1311 Patient has h/o intermittent VT and had ablation on 04/02/23. She has intermittent VT noted on monitor here.  [HN]  1327 Received call froM [employer] representative. Shocked x3 today for VF. She also has Afib going on and slow VT.  After several tries, obtained 20g IV access in L forearm. Patient hypotensive to 80s systolic. Receiving 1L IVF now and awaiting labs. Will consult w/ cardiology. [HN]  1328 CBC(!) No leukocytosis, new mild thrombocytopenia [HN]  1413 Influenza A By PCR(!): POSITIVE +influenza A [HN]  1415 Creatinine(!): 2.60 At her approximate baseline [HN]  1432 D/w cards who will come to see the patient. [HN]  1432 Troponin I (High Sensitivity)(!!): 339 Elevated trop not unexpected [HN]  1505 Assumed care. 78 yo F with hx of CKD, CHF, and VT sp ICD who presented with abdominal pain NV and URI symptoms. But got shocked 3x by [facility] jude ICD for VF. Also has had NSVT and AF. Was initially hypotensive when she arrived. Now is hypoxic on 4L [place]. Will start on amiodarone per cardiology recommendations.  [RP]  1537 Creatinine(!): 2.37 At baseline [RP]  1547 Patient has elevated LFTs.  Denies heavy  alcohol use or Tylenol use.  No right upper quadrant tenderness to palpation or pain. [RP]  1548 Calcium(!): 7.9 Corrects to 9.1 [RP]  1835 Dr Alinda Money from hospitalist will admit to the stepdown unit.  Chest x-ray with right-sided pneumonia and she was given ceftriaxone and doxycycline because of her prolonged QT.  Magnesium is low so we will replenish at this time.  Troponins elevated but likely due to demand.  Patient has remained stable on 5 L nasal cannula. [RP]    Clinical Course User Index [HN] Loetta Rough, MD [RP] Rondel Baton, MD    Labs: I Ordered, and personally interpreted labs.  The pertinent results include:  ***  Imaging Studies ordered: I ordered imaging studies including *** I independently visualized and interpreted imaging. I agree with the radiologist interpretation  Additional history obtained from ***.  External records from outside source obtained and reviewed including ***  Cardiac Monitoring: The patient was maintained on a cardiac monitor.  I personally viewed and interpreted the cardiac monitored which showed an underlying rhythm of: ***  Reevaluation: After the interventions noted above, I reevaluated the patient and found that they have :improved  Social Determinants of Health: Lives independently  Disposition:  Patient is signed out to the oncoming ED physician who is made aware of her history, presentation, exam, workup, and plan.    Co morbidities that complicate the patient evaluation  Past Medical History:  Diagnosis Date   Allergy    Arthritis    Atrial fibrillation (HCC)    controlled with amiodarone, on coumadin   Chronic renal insufficiency    Chronic systolic heart failure (HCC)    Coronary artery disease 01/31/2011   Diabetes mellitus    type 1   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    History of chicken pox    Hyperlipidemia    Hypertension    Ischemic cardiomyopathy    Lumbar spondylosis 01/11/2012   Sleep  apnea    Stroke [facility]) 2010   eye doctor said she had TIA   Ventricular tachycardia (HCC)    Polymorphic     Medicines No orders of the defined types were placed in this encounter.   I  have reviewed the patients home medicines and have made adjustments as needed  Problem List / ED Course: Problem List Items Addressed This Visit       Cardiovascular and Mediastinum   Ventricular fibrillation (HCC) - Primary (Chronic)   Relevant Medications   losartan (COZAAR) 25 MG tablet   aspirin chewable tablet 81 mg   hydrALAZINE (APRESOLINE) injection 10 mg   atorvastatin (LIPITOR) tablet 80 mg   bivalirudin (ANGIOMAX) 250 mg in sodium chloride 0.9 % 500 mL (0.5 mg/mL) infusion   norepinephrine (LEVOPHED) 4mg  in (0.016 mg/mL) premix infusion   metolazone (ZAROXOLYN) tablet 10 mg     Respiratory   Influenza A   Relevant Medications   ceFEPIme (MAXIPIME) 2 g in sodium chloride 0.9 % 100 mL IVPB         {Document critical care time when appropriate:1} {Document review of labs and clinical decision tools ie heart score, Chads2Vasc2 etc:1}  {Document your independent review of radiology images, and any outside records:1} {Document your discussion with family members, caretakers, and with consultants:1} {Document social determinants of health affecting pt's care:1} {Document your decision making why or why not admission, treatments were needed:1}  This note was created using dictation software, which may contain spelling or grammatical errors.

## 2023-05-20 NOTE — ED Notes (Signed)
 PT took po meds without coughing and was able to lift her med cup without any difficulty. PT's O2 stats were hanging around 80% at 5L so this RN placed PT on a non rebreather and cranked it up to 15L,PT only came up to 90%. PT started to look cross eyed and wasn't responding to commands.At this time, 2 other RN's Grover Canavan and [place]) were called to assist. Jarold Motto MD was notified and arrived and RT was  called and arrived. Code cart was placed in front of door for precautionary measures and PT was placed on bipap.After further assessment by physican Jarold Motto) PT was taken to resus where she was intubated.

## 2023-05-20 NOTE — Procedures (Addendum)
 Intubation Procedure Note  [name]  [id number]  [birth year]  Date:05/20/23  [id number]     Procedure: Intubation (31500)  Indication(s) Respiratory Failure   Time Out Verified patient identification, verified procedure, site/side was marked, verified correct patient position, special equipment/implants available, medications/allergies/relevant history reviewed, required imaging and test results available.   Sterile Technique Usual hand hygeine, masks, and gloves were used   Procedure Description Patient positioned in bed supine.  Sedation given as noted above.  Patient was intubated with endotracheal tube using Glidescope.  View was Number of attempts was 1.  Colorimetric CO2 detector was consistent with tracheal placement.   Complications/Tolerance None; patient tolerated the procedure well. Chest X-ray is ordered to verify placement.

## 2023-05-20 NOTE — H&P (Signed)
 History and Physical   ZOEIE RITTER WUJ:[id number] DOB: 1945/06/30 DOA: 05/20/2023  PCP: Pcp, No   Patient coming from: Home  Chief Complaint: Nausea, vomiting, abdominal pain and defibrillator discharge.  HPI: [name] is a 78 y.o. female with medical history significant of hypertension, hyperlipidemia, diabetes, neuropathy, hypothyroidism, CKD 3B, CAD, GERD, CVA, chronic systolic CHF, depression, anxiety, ventricular tachycardia, ventricular fibrillation, atrial fibrillation, status post ICD, obesity, seizure disorder, low back pain, spinal stenosis presenting after feeling unwell for several days and recent ICD discharges.  Patient reports that she has been unwell for possible days.  She is experience nausea vomiting and abdominal pain.  She reports that her defibrillator fired multiple times today.  ED provider confirmed this with the [facility] representative who noted that the device did fire 3 times a day for ventricular fibrillation.  Also noted periods of slow ventricular tachycardia as well as atrial fibrillation.  Patient denies fevers, chills.  ED Course: Vital signs in the ED notable for blood pressure initially in the 70 systolic now improved to the 120s.  Respiratory rate in the 20s.  Requiring 3 L to maintain saturations.  Lab workup included CMP with potassium 3.2, bicarb 18, gap 17, BUN 32, creatinine elevated 2.37 baseline of 2, glucose 2 1, calcium 7.9, protein 6.0, albumin 2.5, T. bili 1.9, AST 1076, ALT 663.  CBC with platelets 142.  BNP elevated to 2548.  Troponin trend 339, 417.  Lipase normal.  Respiratory panel positive for flu A.  Lactic acid pending.  Urinalysis pending.  Blood cultures pending.  VBG with normal pH pCO2 40.7.  Magnesium pending.  Chest x-ray with new bilateral opacities consistent with pneumonia.  CT of the abdomen pelvis showing no acute normality in the abdomen pelvis, bilateral opacity at the lung bases, left renal cysts.  Patient received  ceftriaxone, doxycycline, Tamiflu in the ED.  Started on amiodarone infusion.  Also received morphine, 1 L IV fluids and 40 mEq p.o. potassium x 2.  Cardiology consulted and recommended amiodarone fusion, repletion of magnesium and potassium as needed, adding back beta-blocker as blood pressure allows.  Review of Systems: As per HPI otherwise all other systems reviewed and are negative.  Past Medical History:  Diagnosis Date   Acute kidney injury superimposed on chronic kidney disease (HCC) 10/02/2022   Allergy    Arthritis    Atrial fibrillation (HCC)    controlled with amiodarone, on coumadin   Chronic renal insufficiency    Chronic systolic CHF (congestive heart failure) (HCC) 03/20/2013   Chronic systolic heart failure (HCC)    Coronary artery disease 01/31/2011   Diabetes mellitus    type 1   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    History of chicken pox    Hyperlipidemia    Hypertension    Ischemic cardiomyopathy    Knee MCL sprain 03/28/2014   Left knee pain 08/30/2012   Lumbar spondylosis 01/11/2012   Pes anserine bursitis 02/23/2014   Sleep apnea    Stroke [facility]) 2010   eye doctor said she had TIA   Suicide attempt by benzodiazepine overdose (HCC) 01/21/2020   UTI (urinary tract infection) 08/03/2013   Ventricular tachycardia (HCC)    Polymorphic    Past Surgical History:  Procedure Laterality Date   ABDOMINAL HYSTERECTOMY  1995   CARDIAC DEFIBRILLATOR PLACEMENT     CHOLECYSTECTOMY  1985   COLONOSCOPY WITH PROPOFOL N/A 02/24/2018   Procedure: COLONOSCOPY WITH PROPOFOL;  Surgeon: Rachael Fee, MD;  Location: [facility] ENDOSCOPY;  Service: Endoscopy;  Laterality: N/A;   ICD  2003/2007   implanted by Dr Alanda Amass, most recent generator change 2/13 by Dr Johney Frame, Analyze ST study patient   ICD GENERATOR CHANGEOUT N/A 04/11/2020   Procedure: ICD GENERATOR CHANGEOUT;  Surgeon: Hillis Range, MD;  Location: [facility] INVASIVE CV LAB;  Service: Cardiovascular;  Laterality:  N/A;   IMPLANTABLE CARDIOVERTER DEFIBRILLATOR (ICD) GENERATOR CHANGE N/A 05/05/2011   Procedure: ICD GENERATOR CHANGE;  Surgeon: Hillis Range, MD;  Location: [facility] CATH LAB;  Service: Cardiovascular;  Laterality: N/A;   LEFT HEART CATH AND CORONARY ANGIOGRAPHY N/A 10/14/2022   Procedure: LEFT HEART CATH AND CORONARY ANGIOGRAPHY;  Surgeon: Kathleene Hazel, MD;  Location: [facility] INVASIVE CV LAB;  Service: Cardiovascular;  Laterality: N/A;   LEFT HEART CATHETERIZATION WITH CORONARY ANGIOGRAM N/A 01/30/2011   Procedure: LEFT HEART CATHETERIZATION WITH CORONARY ANGIOGRAM;  Surgeon: Laurey Morale, MD;  Location: [facility] CATH LAB;  Service: Cardiovascular;  Laterality: N/A;   POLYPECTOMY  02/24/2018   Procedure: POLYPECTOMY;  Surgeon: Rachael Fee, MD;  Location: [facility]OS[facility];  Service: Endoscopy;;   Ma Rings  1980   V TACH ABLATION N/A 04/02/2023   Procedure: Floyce Stakes ABLATION;  Surgeon: Regan Lemming, MD;  Location: [facility] INVASIVE CV LAB;  Service: Cardiovascular;  Laterality: N/A;    Social History  reports that she quit smoking about 27 years ago. Her smoking use included cigarettes. She has never been exposed to tobacco smoke. She has never used smokeless tobacco. She reports that she does not drink alcohol and does not use drugs.  Allergies  Allergen Reactions   Veltassa [Patiromer] Diarrhea   Aldactone [Spironolactone] Diarrhea and Nausea And Vomiting   Darvon [Propoxyphene] Other (See Comments)    Indigestion    Mexitil [Mexiletine] Nausea And Vomiting and Other (See Comments)    Tremors Difficulty walking Blurred vision   Phenergan [Promethazine Hcl] Other (See Comments)    Hyperactivity    Ranexa [Ranolazine Er] Diarrhea   Plavix [Clopidogrel Bisulfate] Rash    Family History  Problem Relation Age of Onset   Diabetes Mother    Heart disease Mother    Hyperlipidemia Mother    Hypertension Mother    Heart disease Father    Heart attack Father    Hypertension Father     Breast cancer Sister    Lung cancer Sister    Irritable bowel syndrome Sister    Early death Brother 07/04/2023   Colon cancer Maternal Aunt    Seizures Granddaughter    Esophageal cancer Neg Hx    Colon polyps Neg Hx   Reviewed on admission  Prior to Admission medications   Medication Sig Start Date End Date Taking? Authorizing Provider  amiodarone (PACERONE) 200 MG tablet Take 1 tablet (200 mg total) by mouth daily. 11/15/22  Yes Jeanella Craze, NP  aspirin EC 81 MG tablet Take 1 tablet (81 mg total) by mouth daily. Swallow whole. 10/17/22  Yes Ollis, Brandi L, NP  busPIRone (BUSPAR) 5 MG tablet Take 1 tablet (5 mg total) by mouth 2 (two) times daily. 05/20/21  Yes Milford, Anderson Malta, FNP  carvedilol (COREG) 25 MG tablet TAKE 1 TABLET (25 MG TOTAL) BY MOUTH TWICE A DAY WITH MEALS 04/15/23  Yes Camnitz, Andree Coss, MD  Cholecalciferol (VITAMIN D-3 PO) Take 1 tablet by mouth daily.   Yes [provider]  dicyclomine (BENTYL) 10 MG capsule TAKE 1 CAPSULE (10 MG TOTAL) BY MOUTH 4 (FOUR) TIMES  DAILY BEFORE MEALS AND AT BEDTIME. 02/09/20  Yes Corwin Levins, MD  furosemide (LASIX) 20 MG tablet Take 2 tablets (40 mg) by mouth once a day. 03/12/23  Yes Laurey Morale, MD  gabapentin (NEURONTIN) 100 MG capsule Take 200 mg by mouth 2 (two) times daily.   Yes [provider]  insulin aspart (NOVOLOG FLEXPEN) 100 UNIT/ML FlexPen Inject up to 15 units daily under skin as advised Patient taking differently: Inject 4-5 Units into the skin 3 (three) times daily before meals. Inject up to 15 units daily under skin as advised 06/24/22  Yes Carlus Pavlov, MD  insulin degludec (TRESIBA FLEXTOUCH) 100 UNIT/ML FlexTouch Pen Inject 12-14 Units into the skin daily. Patient taking differently: Inject 14 Units into the skin at bedtime. 01/21/23  Yes Carlus Pavlov, MD  levETIRAcetam (KEPPRA XR) 500 MG 24 hr tablet Take 1 tablet (500 mg total) by mouth at bedtime. 12/22/22 12/17/23 Yes Camara, Amalia Hailey, MD   losartan (COZAAR) 25 MG tablet Take 25 mg by mouth daily.   Yes [provider]  Multiple Vitamins-Minerals (EYE VITAMINS PO) Take 1 tablet by mouth 2 (two) times daily.   Yes [provider]  nitroGLYCERIN (NITROSTAT) 0.4 MG SL tablet Place 1 tablet (0.4 mg total) under the tongue every 5 (five) minutes as needed for chest pain. 12/09/22  Yes Sheilah Pigeon, PA-C  omeprazole (PRILOSEC) 20 MG capsule Take 1 capsule by mouth daily. 10/15/22  Yes [provider]  Rivaroxaban (XARELTO) 15 MG TABS tablet Take 1 tablet (15 mg total) by mouth daily with supper. Patient taking differently: Take 15 mg by mouth at bedtime. 04/02/23  Yes Ollis, Brandi L, NP  rosuvastatin (CRESTOR) 40 MG tablet Take 1 tablet (40 mg total) by mouth daily. NEEDS FOLLOW UP APPOINTMENT FOR MORE REFILLS Patient taking differently: Take 40 mg by mouth at bedtime. 09/17/22  Yes Laurey Morale, MD  sertraline (ZOLOFT) 50 MG tablet Take 50 mg by mouth at bedtime. 09/17/22  Yes [provider]  Continuous Glucose Sensor (FREESTYLE LIBRE 3 SENSOR) MISC 1 each by Does not apply route every 14 (fourteen) days. 11/19/22   Carlus Pavlov, MD  magnesium oxide (MAG-OX) 400 (240 Mg) MG tablet TAKE 1 TABLET BY MOUTH TWICE A DAY Patient not taking: Reported on 05/20/2023 12/19/21   Corwin Levins, MD    Physical Exam: Vitals:   05/20/23 1431 05/20/23 1530 05/20/23 1600 05/20/23 1601  BP:  112/66 104/86   Pulse: 94 92 93 88  Resp: (!) 22 (!) 35 (!) 38 18  Temp:      TempSrc:      SpO2: 96% (!) 89% 90% 92%  Weight:      Height:        Physical Exam Constitutional:      General: She is not in acute distress.    Appearance: She is ill-appearing.  HENT:     Head: Normocephalic and atraumatic.     Mouth/Throat:     Mouth: Mucous membranes are moist.     Pharynx: Oropharynx is clear.  Eyes:     Extraocular Movements: Extraocular movements intact.     Pupils: Pupils are equal, round, and reactive  to light.  Cardiovascular:     Rate and Rhythm: Normal rate and regular rhythm.     Pulses: Normal pulses.     Heart sounds: Normal heart sounds.  Pulmonary:     Effort: No respiratory distress.     Breath sounds: Rhonchi  and rales (Minimal) present.  Abdominal:     General: Bowel sounds are normal. There is no distension.     Palpations: Abdomen is soft.     Tenderness: There is abdominal tenderness.  Musculoskeletal:        General: No swelling or deformity.     Right lower leg: No edema.     Left lower leg: No edema.  Skin:    General: Skin is warm and dry.  Neurological:     General: No focal deficit present.     Mental Status: Mental status is at baseline.    Labs on Admission: I have personally reviewed following labs and imaging studies  CBC: Recent Labs  Lab 05/20/23 1223 05/20/23 1340  WBC 8.7  --   HGB 13.0 13.3  13.3  HCT 40.3 39.0  39.0  MCV 89.0  --   PLT 142*  --     Basic Metabolic Panel: Recent Labs  Lab 05/20/23 1340 05/20/23 1440  NA 139  140 138  K 3.8  3.9 3.2*  CL 104 103  CO2  --  18*  GLUCOSE 221* 201*  BUN 41* 32*  CREATININE 2.60* 2.37*  CALCIUM  --  7.9*    GFR: Estimated Creatinine Clearance: 20.1 mL/min (A) (by C-G formula based on SCr of 2.37 mg/dL (H)).  Liver Function Tests: Recent Labs  Lab 05/20/23 1440  AST 1,076*  ALT 663*  ALKPHOS 59  BILITOT 1.9*  PROT 6.0*  ALBUMIN 2.5*    Urine analysis:    Component Value Date/Time   COLORURINE YELLOW 08/31/2021 0727   APPEARANCEUR CLEAR 08/31/2021 0727   LABSPEC 1.020 08/31/2021 0727   PHURINE 5.5 08/31/2021 0727   GLUCOSEU >=500 (A) 08/31/2021 0727   GLUCOSEU NEGATIVE 08/16/2018 1312   HGBUR SMALL (A) 08/31/2021 0727   BILIRUBINUR NEGATIVE 08/31/2021 0727   KETONESUR NEGATIVE 08/31/2021 0727   PROTEINUR NEGATIVE 08/31/2021 0727   UROBILINOGEN 1.0 08/16/2018 1312   NITRITE NEGATIVE 08/31/2021 0727   LEUKOCYTESUR SMALL (A) 08/31/2021 0727    Radiological  Exams on Admission: CT ABDOMEN PELVIS WO CONTRAST Result Date: 05/20/2023 CLINICAL DATA:  Acute abdominal pain. EXAM: CT ABDOMEN AND PELVIS WITHOUT CONTRAST TECHNIQUE: Multidetector CT imaging of the abdomen and pelvis was performed following the standard protocol without IV contrast. RADIATION DOSE REDUCTION: This exam was performed according to the departmental dose-optimization program which includes automated exposure control, adjustment of the mA and/or kV according to patient size and/or use of iterative reconstruction technique. COMPARISON:  CT abdomen and pelvis 08/01/2014. FINDINGS: Lower chest: There are mild patchy opacities in both lung bases. Hepatobiliary: No focal liver abnormality is seen. Status post cholecystectomy. No biliary dilatation. Pancreas: Unremarkable. No pancreatic ductal dilatation or surrounding inflammatory changes. Spleen: Normal in size without focal abnormality. Adrenals/Urinary Tract: Adrenal glands are unremarkable. There is no hydronephrosis or urinary tract calculus. There is an 18 mm cyst in the left kidney. Bladder is unremarkable. Stomach/Bowel: Stomach is within normal limits. Appendix is not seen. No evidence of bowel wall thickening, distention, or inflammatory changes. Vascular/Lymphatic: Aortic atherosclerosis. No enlarged abdominal or pelvic lymph nodes. Reproductive: Status post hysterectomy. No adnexal masses. Other: No abdominal wall hernia or abnormality. No abdominopelvic ascites. Musculoskeletal: Multilevel degenerative changes affect the spine. IMPRESSION: 1. No acute localizing process in the abdomen or pelvis. 2. Mild patchy opacities in both lung bases, possibly atelectasis or infection. 3. Left Bosniak I benign renal cyst measuring 1.8 cm. No follow-up imaging is recommended. JACR  2018 Feb; 264-273, Management of the Incidental Renal Mass on CT, [employer] 2021; [phone], Bosniak Classification of Cystic Renal Masses, Version 2019. Aortic Atherosclerosis  (ICD10-I70.0). Electronically Signed   By: Darliss Cheney M.D.   On: 05/20/2023 17:32   DG Chest Portable 1 View Result Date: 05/20/2023 CLINICAL DATA:  mild resp distress, tachypnea, hypoxia. EXAM: PORTABLE CHEST 1 VIEW COMPARISON:  12/07/2022. FINDINGS: There are new heterogeneous opacities overlying the right upper mid lung zones, concerning for pneumonia. Follow-up to clearing is recommended. Bilateral lung fields are otherwise clear. Bilateral lateral costophrenic angles are clear. Stable cardio-mediastinal silhouette. There is a left sided 2-lead pacemaker. No acute osseous abnormalities. The soft tissues are within normal limits. IMPRESSION: New heterogeneous opacities overlying the right upper and mid lung zones, concerning for pneumonia. Follow-up to clearing is recommended. Electronically Signed   By: Jules Schick M.D.   On: 05/20/2023 15:54   EKG: Independently reviewed.  Sinus rhythm at 100 bpm.  Significant baseline wander, some leads with baseline artifact, PVCs, nonspecific T wave changes.  Difficult to interpret given the degree of baseline wander and artifact.  QTc read as 536.  Repeat EKG in the system is similar with rate of 103.  Assessment/Plan Active Problems:   Hypothyroidism   PAF (paroxysmal atrial fibrillation) (HCC)   Implantable cardioverter-defibrillator (ICD) in situ   Coronary artery disease   HTN (hypertension)   Obesity, unspecified   Chronic systolic CHF (congestive heart failure) (HCC)   Lower back pain   Spinal stenosis of lumbar region   Diabetic peripheral neuropathy associated with type 2 diabetes mellitus (HCC)   Hyperlipidemia   DM (diabetes mellitus), type 2 with renal complications (HCC)   Chronic kidney disease, stage 3b (HCC)   VT (ventricular tachycardia) (HCC)   Seizure disorder (HCC)   Ventricular fibrillation (HCC)   Gastroesophageal reflux disease without esophagitis   History of cerebrovascular accident   Flu A Pneumonia Acute  respiratory failure with hypoxia > Noted to have evidence of multifocal pneumonia on chest x-ray. > Did initially receive ceftriaxone and doxycycline in the ED. > No leukocytosis.  Positive for influenza A.  Likely viral pneumonia.  Has received Tamiflu. > Will check procalcitonin to further delineate, however viral is most likely considering symptoms have been ongoing for only 3 days. > Unclear to what extent cardiac issues are playing a role here, given she is currently not tachycardic, appears less likely to be playing a role at the moment. - Monitor on progressive unit overnight - Hold off on further antibiotics - Procalcitonin - Tamiflu - Supportive care  Ventricular tachycardia Ventricular fibrillation Atrial fibrillation Atrial flutter > Known history of these.  Has ICD in place.  ICD fired 3 times a day for V-fib and device rep noted slow V. tach and A-fib today as well. > EP consulted in the ED and recommend continue with amiodarone infusion, potassium greater than equal to 4, magnesium greater than or equal to 2. - Monitoring on progressive unit - Appreciate cardiology/electrophysiology recommendations and assistance - Continue with amiodarone infusion - Replete potassium and magnesium as needed - Continue anticoagulation with Xarelto  Chronic systolic CHF > Last echo was October 2024 with EF 35-40%, G1 DD, mild reduced RV function. > Noted to have elevated LFTs and initially was hypotensive.  Imaging is more consistent with pneumonia than edema.  Does not appear to be volume overloaded.   > Elevated BNP of 2548 likely secondary to her arrhythmia episodes.  LFT changes could be  viral versus shock due to arriving hypotensive. > Unclear if could be developing a component of carditis from flu.  Will hold off on repeat echocardiogram for now, pending cardiology recommendations. - Holding Lasix, losartan, Coreg due to arriving hypotensive - Strict I's and O's, daily weights -  Close monitoring  Nausea Vomiting Abdominal pain > No acute abnormality noted on CT.  Continues to have abdominal pain and tenderness.  Possibly related to her recurrent nausea and vomiting. - Will add additional doses of morphine which has worked for her some in the ED. (Scheduled to stop midday 2/28) - Supportive care, note QTc is prolonged so we will need to avoid excessive antiemetics.  LFT elevation > Possibly secondary to shock versus viral versus reactive with her N/V. > Certainly has influenza A and arrived hypotensive. > AST 1076, ALT 663.  Will trend for now and workup further as indicated. - Trend CMP  AKI on CKD 3B Hypokalemia > Creatinine elevated to 2.37 from baseline of 2.  Potassium 3.2. > Arrived hypotensive and does not appear volume overloaded.  Blood pressure improved with IV fluids. > Received total of 80 mEq p.o. potassium.  Magnesium pending. - Trend renal function and electrolytes - Follow-up magnesium  Hypertension - Holding antihypertensives in the setting of initial hypotension.  Hyperlipidemia - Continue rosuvastatin  Diabetes > 14 units qhs and 45 units with meals at home. - 10 units qhs, SSI  CAD - Continue ASA, rosuvastatin - On Xarelto  History of CVA - Continue home ASA, rosuvastatin - On Xarelto  GERD - Continue home PPI  Seizure disorder - Continue home Keppra - Also on gabapentin  Neuropathy - Continue home gabapentin  Low back pain Spinal stenosis Obesity - Noted  QT prolongation > 536 on EKG - Avoid QTc prolonging medications - A.m. EKG  DVT prophylaxis: Xarelto Code Status:   Full  Family Communication:  None on admission  Disposition Plan:   Patient is from:  Home  Anticipated DC to:  Home  Anticipated DC date:  1-7 days  Anticipated DC barriers: None  Consults called:  Cardiology/EP  Admission status:  Observation, Progressive   Severity of Illness: The appropriate patient status for this patient is  OBSERVATION. Observation status is judged to be reasonable and necessary in order to provide the required intensity of service to ensure the patient's safety. The patient's presenting symptoms, physical exam findings, and initial radiographic and laboratory data in the context of their medical condition is felt to place them at decreased risk for further clinical deterioration. Furthermore, it is anticipated that the patient will be medically stable for discharge from the hospital within 2 midnights of admission.    Synetta Fail MD [facility]  How to contact the [facility] Attending or Consulting provider 7A - 7P or covering provider during after hours 7P -7A, for this patient?   Check the care team in [facility] and look for a) attending/consulting [facility] provider listed and b) the [facility] team listed Log into [email or web address] and use [facility] universal password to access. If you do not have the password, please contact the hospital operator. Locate the [facility] provider you are looking for under [facility] and page to a number that you can be directly reached. If you still have difficulty reaching the provider, please page the [facility] (Director on Call) for the Hospitalists listed on amion for assistance.  05/20/2023, 6:18 PM

## 2023-05-20 NOTE — Consult Note (Signed)
 ELECTROPHYSIOLOGY CONSULT NOTE    Patient ID: [name] MRN: [id number], DOB/AGE: 29-Oct-1945 78 y.o.  Admit date: 05/20/2023 Date of Consult: 05/20/2023  Primary Physician: Oneita Hurt, No Primary Cardiologist: [name], MD  Electrophysiologist: Dr. Elberta Fortis   Referring Provider: Dr. Jearld Fenton  Patient Profile: [name] is a 78 y.o. female with a history of hypothyroidism, HLD, CAD, ICM / chronic systolic CHF (LVEF 25-30%), G1 DD, AF and VT  who is being seen today for the evaluation of device therapies at the request of Dr. Jearld Fenton.  HPI:  [name] is a 78 y.o. female very well known to the EP service.   Had recent attempt at VT ablation 1/10, but was cancelled due to the presence of a thrombus on the posterior medial papillary muscle. Discharged on Xarelto.   Seen in device clinic 04/08/2023 for device reprogramming with VT episodes as low as 119 bpm. VT1 zone adjusted to 110 from 139.  Pt was scheduled to be seen in office today, but called to cancel as she was feeling sick. Subsequently device clinic received alert for device therapies and recommended ED as pt  was also having abdominal pain and N/V.   Pertinent arrival labs as below. HS trop 339. Flu A positive. CXR and CT Abd/Pelvis pending. Formal read pending but CXR does appear to show R sided patchy-infiltrates.   EP asked to consult given ICD therapies. As seen in phone note from early today, ATP episode appeared to show AFL with dual tachycardia VT, where the ATP broke the VT, AFL persistent.  Presenting EGM on remote sent from ED concerning for very frequent ectopy vs slow VT.   Pt states she has felt bad for 3-4 days. Audible rhonchis while at bedside. On O2 via [place] currently. No syncope. Has been taking medications as directed.   Labs Potassium3.2* (02/27 1440)   Creatinine, ser  2.37* (02/27 1440) PLT  142* (02/27 1223) HGB  13.3, 13.3 (02/27 1340) WBC 8.7 (02/27 1223) Troponin I (High  Sensitivity)339* (02/27 1259).     Allergies, Medical, Surgical, Social, and Family Histories have been reviewed and are referenced here-in when relevant for medical decision making.      Physical Exam: Vitals:   05/20/23 1428 05/20/23 1429 05/20/23 1430 05/20/23 1431  BP:   (!) 124/59   Pulse: 94 93 92 94  Resp: 20 (!) 27 18 (!) 22  Temp:      TempSrc:      SpO2: 94% 92% 96% 96%  Weight:      Height:        GEN- Ill appearing and somewhat SOB. NAD, A&O x 3, normal affect HEENT: Normocephalic, atraumatic Lungs- Audible rhonchi  Heart- Irregularly irregular with frequent ectopy as well.   GI- Soft, NT, ND.  Extremities- No clubbing or cyanosis   Radiology/Studies:   CXR pending CT Abd/Pelv Pending  EKG:on arrival shows ?NSR in 100s with PVCs (personally reviewed)  TELEMETRY: ?NSR (personally reviewed)  Arrhythmia/Device History DEVICE HISTORY:  EP Study 2003 > inducible PMVT  Abbott / SJM Dual Chamber ICD, initial implant 2003, 2007.  Gen Change: 2013, 03/2020 - at 03/2020 gen change, SJM SJM [address] ST 7040 lead was fluoroscopically and electrically normal, opted to use this lead rather than replacing it after a long discussion with the patient prior to the procedure    AAD Hx 2007 amiodarone started quickly stopped 2/2 nausea Remotely as well tried on Sotalol and Multaq unclear when/why they were  stopped 2012 restarted on amiodarone 2024 Mexiletine intolerant, transitioned to Ranexa   Assessment/Plan:  VT/VF Likely in setting of acute illness +/- dual tachycardia by some strips She has continued to have occasional ATP in the ER due to low detection intervals with slow VT historically.  Bolus and reload IV amiodarone to help through acute illness. Potassium3.2* (02/27 1440) (Had been 3.8 on POC) Magnesium level pending.  Creatinine, ser  2.37* (02/27 1440) Keep K > 4.0 and Mg > 2.0   Paroxysmal AF Atrial flutter Continue Xarelto 15 mg daily She appears to be  going in and out, with slower sinus earlier on tele, and now in AF.  Also having intermittent RVR, NSVT and VT going into detection due to her low detection intervals.  Bolus IV amiodarone as above.  Would add back her BB as BP tolerates.   Flu Likely PNA Hypoxic respiratory failure  Abdominal pain N/V Low grade temp, 99.2 Patchy infiltrates on CXR but formal read pending at time of note.  Has been sick for 3/4 days.  Remains hypoxic on 4L [place]  Have asked ED to have medicine admit.   EP will follow along.   For questions or updates, please contact [facility] Please consult [email or web address] for contact info under Cardiology/STEMI.  Dustin Flock, PA-C  05/20/2023 3:33 PM

## 2023-05-20 NOTE — Telephone Encounter (Signed)
 New Message:       Son called to cancel patient's appointment for today(05-20-23), because she was sick. Son said he would like for you to order a Home Health Nurse to come see the patient. He says she really needs someone to come out to see her.

## 2023-05-20 NOTE — ED Notes (Signed)
 Pt intubated, 7.0 ETT

## 2023-05-20 NOTE — ED Notes (Incomplete)
 PT 's bp was hypotensive at 90/76 then came back up to 17

## 2023-05-20 NOTE — Progress Notes (Signed)
 MD speaking with pt's family. Unable to obtain ABG at this time.

## 2023-05-20 NOTE — ED Notes (Signed)
 This Rn contacted phlebotomy regarding blood cultures and lactic draw

## 2023-05-20 NOTE — Telephone Encounter (Signed)
 Left detailed message on son's voicemail informing him that he will need to reach out to advanced heart failure clinic/PCP for [facility] needs/orders. Advised to call back with any further questions.

## 2023-05-20 NOTE — Telephone Encounter (Signed)
 Alert received from [employer] for VT/VF episode with successful ATP. Event occurred 2/26 @ 22:38, duration 38sec, mean HR 144 falling into VT-1 zone.  EGM c/w AF/AFL with sustained VT pace terminated with 1 burst of ATP. There have been 32 NSVT without therapy from 2/26 and 2/27.  Spoke to patients family who states patient is on the way to [facility]. States she has been sick for several days.

## 2023-05-20 NOTE — ED Provider Notes (Addendum)
 Pt's work of breathing has increased. Dr Duard Larsen updated. Attempted to call son Arlys John but spoke with granddaughter Emilyn Ruble has confirmed full code. Pt was intubated. Did require pressors. Central line placed. Dr Allena Katz from ICU at bedside and will admit.   Procedure Name: Awake intubation Date/Time: 05/20/2023 11:48 PM  Performed by: Rondel Baton, MDPre-anesthesia Checklist: Patient identified, Emergency Drugs available, Suction available, Patient being monitored and Timeout performed Oxygen Delivery Method: Ambu bag Preoxygenation: Pre-oxygenation with 100% oxygen Induction Type: IV induction and Rapid sequence Laryngoscope Size: Glidescope and 3 Grade View: Grade I Tube size: 7.5 mm Number of attempts: 1 Airway Equipment and Method: Rigid stylet and Video-laryngoscopy Placement Confirmation: ETT inserted through vocal cords under direct vision, Positive ETCO2 and CO2 detector Secured at: 21 cm Tube secured with: ETT holder    [employer]  Date/Time: 05/20/2023 11:49 PM  Performed by: Rondel Baton, MD Authorized by: Rondel Baton, MD   Consent:    Consent obtained:  Emergent situation Universal protocol:    Patient identity confirmed:  Arm band Pre-procedure details:    Indication(s): central venous access     Hand hygiene: Hand hygiene performed prior to insertion     Sterile barrier technique: All elements of maximal sterile technique followed     Skin preparation:  Chlorhexidine   Skin preparation agent: Skin preparation agent completely dried prior to procedure   Sedation:    Sedation type:  None Anesthesia:    Anesthesia method:  None Procedure details:    Location:  R internal jugular   Procedural supplies:  Triple lumen   Catheter size:  7 Fr   Ultrasound guidance: yes     Ultrasound guidance timing: prior to insertion and real time     Sterile ultrasound techniques: Sterile gel and sterile probe covers were used     Number of attempts:   1   Successful placement: yes   Post-procedure details:    Post-procedure:  Dressing applied and line sutured   Assessment:  Blood return through all ports and no pneumothorax on x-ray   Procedure completion:  Tolerated well, no immediate complications  CRITICAL CARE Performed by: Rondel Baton   Total critical care time: 45 minutes  Critical care time was exclusive of separately billable procedures and treating other patients.  Critical care was necessary to treat or prevent imminent or life-threatening deterioration.  Critical care was time spent personally by me on the following activities: development of treatment plan with patient and/or surrogate as well as nursing, discussions with consultants, evaluation of patient's response to treatment, examination of patient, obtaining history from patient or surrogate, ordering and performing treatments and interventions, ordering and review of laboratory studies, ordering and review of radiographic studies, pulse oximetry and re-evaluation of patient's condition.     Rondel Baton, MD 05/20/23 2350

## 2023-05-20 NOTE — ED Notes (Signed)
 Time out for central line to right internal jugular due to vasopressors

## 2023-05-21 ENCOUNTER — Observation Stay (HOSPITAL_COMMUNITY): Payer: Medicare PPO

## 2023-05-21 DIAGNOSIS — E039 Hypothyroidism, unspecified: Secondary | ICD-10-CM | POA: Diagnosis present

## 2023-05-21 DIAGNOSIS — J9690 Respiratory failure, unspecified, unspecified whether with hypoxia or hypercapnia: Secondary | ICD-10-CM | POA: Diagnosis not present

## 2023-05-21 DIAGNOSIS — E669 Obesity, unspecified: Secondary | ICD-10-CM | POA: Diagnosis present

## 2023-05-21 DIAGNOSIS — Z4682 Encounter for fitting and adjustment of non-vascular catheter: Secondary | ICD-10-CM | POA: Diagnosis not present

## 2023-05-21 DIAGNOSIS — E8721 Acute metabolic acidosis: Secondary | ICD-10-CM | POA: Diagnosis not present

## 2023-05-21 DIAGNOSIS — K7200 Acute and subacute hepatic failure without coma: Secondary | ICD-10-CM | POA: Diagnosis not present

## 2023-05-21 DIAGNOSIS — N170 Acute kidney failure with tubular necrosis: Secondary | ICD-10-CM | POA: Diagnosis not present

## 2023-05-21 DIAGNOSIS — I5020 Unspecified systolic (congestive) heart failure: Secondary | ICD-10-CM | POA: Diagnosis not present

## 2023-05-21 DIAGNOSIS — I700 Atherosclerosis of aorta: Secondary | ICD-10-CM | POA: Diagnosis not present

## 2023-05-21 DIAGNOSIS — D696 Thrombocytopenia, unspecified: Secondary | ICD-10-CM | POA: Diagnosis not present

## 2023-05-21 DIAGNOSIS — N179 Acute kidney failure, unspecified: Secondary | ICD-10-CM | POA: Diagnosis not present

## 2023-05-21 DIAGNOSIS — I5023 Acute on chronic systolic (congestive) heart failure: Secondary | ICD-10-CM | POA: Diagnosis not present

## 2023-05-21 DIAGNOSIS — I472 Ventricular tachycardia, unspecified: Secondary | ICD-10-CM | POA: Diagnosis present

## 2023-05-21 DIAGNOSIS — E1165 Type 2 diabetes mellitus with hyperglycemia: Secondary | ICD-10-CM | POA: Diagnosis not present

## 2023-05-21 DIAGNOSIS — N1832 Chronic kidney disease, stage 3b: Secondary | ICD-10-CM | POA: Diagnosis present

## 2023-05-21 DIAGNOSIS — J09X2 Influenza due to identified novel influenza A virus with other respiratory manifestations: Secondary | ICD-10-CM

## 2023-05-21 DIAGNOSIS — R6521 Severe sepsis with septic shock: Secondary | ICD-10-CM | POA: Diagnosis present

## 2023-05-21 DIAGNOSIS — I272 Pulmonary hypertension, unspecified: Secondary | ICD-10-CM | POA: Diagnosis present

## 2023-05-21 DIAGNOSIS — J1100 Influenza due to unidentified influenza virus with unspecified type of pneumonia: Secondary | ICD-10-CM | POA: Diagnosis not present

## 2023-05-21 DIAGNOSIS — I4901 Ventricular fibrillation: Secondary | ICD-10-CM | POA: Diagnosis present

## 2023-05-21 DIAGNOSIS — G40909 Epilepsy, unspecified, not intractable, without status epilepticus: Secondary | ICD-10-CM | POA: Diagnosis present

## 2023-05-21 DIAGNOSIS — J80 Acute respiratory distress syndrome: Secondary | ICD-10-CM | POA: Diagnosis not present

## 2023-05-21 DIAGNOSIS — I5082 Biventricular heart failure: Secondary | ICD-10-CM | POA: Diagnosis not present

## 2023-05-21 DIAGNOSIS — R0989 Other specified symptoms and signs involving the circulatory and respiratory systems: Secondary | ICD-10-CM | POA: Diagnosis not present

## 2023-05-21 DIAGNOSIS — J929 Pleural plaque without asbestos: Secondary | ICD-10-CM | POA: Diagnosis not present

## 2023-05-21 DIAGNOSIS — Q211 Atrial septal defect, unspecified: Secondary | ICD-10-CM | POA: Diagnosis not present

## 2023-05-21 DIAGNOSIS — R578 Other shock: Secondary | ICD-10-CM | POA: Diagnosis not present

## 2023-05-21 DIAGNOSIS — I480 Paroxysmal atrial fibrillation: Secondary | ICD-10-CM | POA: Diagnosis not present

## 2023-05-21 DIAGNOSIS — R570 Cardiogenic shock: Secondary | ICD-10-CM | POA: Diagnosis not present

## 2023-05-21 DIAGNOSIS — E119 Type 2 diabetes mellitus without complications: Secondary | ICD-10-CM | POA: Diagnosis not present

## 2023-05-21 DIAGNOSIS — I771 Stricture of artery: Secondary | ICD-10-CM | POA: Diagnosis not present

## 2023-05-21 DIAGNOSIS — I5042 Chronic combined systolic (congestive) and diastolic (congestive) heart failure: Secondary | ICD-10-CM | POA: Diagnosis not present

## 2023-05-21 DIAGNOSIS — Z452 Encounter for adjustment and management of vascular access device: Secondary | ICD-10-CM | POA: Diagnosis not present

## 2023-05-21 DIAGNOSIS — G9340 Encephalopathy, unspecified: Secondary | ICD-10-CM | POA: Diagnosis not present

## 2023-05-21 DIAGNOSIS — Z66 Do not resuscitate: Secondary | ICD-10-CM | POA: Diagnosis present

## 2023-05-21 DIAGNOSIS — J159 Unspecified bacterial pneumonia: Secondary | ICD-10-CM | POA: Diagnosis present

## 2023-05-21 DIAGNOSIS — A419 Sepsis, unspecified organism: Secondary | ICD-10-CM | POA: Diagnosis present

## 2023-05-21 DIAGNOSIS — I517 Cardiomegaly: Secondary | ICD-10-CM | POA: Diagnosis not present

## 2023-05-21 DIAGNOSIS — G9341 Metabolic encephalopathy: Secondary | ICD-10-CM | POA: Diagnosis not present

## 2023-05-21 DIAGNOSIS — I130 Hypertensive heart and chronic kidney disease with heart failure and stage 1 through stage 4 chronic kidney disease, or unspecified chronic kidney disease: Secondary | ICD-10-CM | POA: Diagnosis present

## 2023-05-21 DIAGNOSIS — Z95810 Presence of automatic (implantable) cardiac defibrillator: Secondary | ICD-10-CM | POA: Diagnosis not present

## 2023-05-21 DIAGNOSIS — S301XXA Contusion of abdominal wall, initial encounter: Secondary | ICD-10-CM | POA: Diagnosis not present

## 2023-05-21 DIAGNOSIS — J9602 Acute respiratory failure with hypercapnia: Secondary | ICD-10-CM | POA: Diagnosis present

## 2023-05-21 DIAGNOSIS — Z7189 Other specified counseling: Secondary | ICD-10-CM | POA: Diagnosis not present

## 2023-05-21 DIAGNOSIS — R609 Edema, unspecified: Secondary | ICD-10-CM | POA: Diagnosis not present

## 2023-05-21 DIAGNOSIS — I509 Heart failure, unspecified: Secondary | ICD-10-CM | POA: Diagnosis not present

## 2023-05-21 DIAGNOSIS — D638 Anemia in other chronic diseases classified elsewhere: Secondary | ICD-10-CM | POA: Diagnosis not present

## 2023-05-21 DIAGNOSIS — R0603 Acute respiratory distress: Secondary | ICD-10-CM | POA: Diagnosis not present

## 2023-05-21 DIAGNOSIS — I4891 Unspecified atrial fibrillation: Secondary | ICD-10-CM | POA: Diagnosis not present

## 2023-05-21 DIAGNOSIS — N184 Chronic kidney disease, stage 4 (severe): Secondary | ICD-10-CM | POA: Diagnosis not present

## 2023-05-21 DIAGNOSIS — E870 Hyperosmolality and hypernatremia: Secondary | ICD-10-CM | POA: Diagnosis not present

## 2023-05-21 DIAGNOSIS — E1122 Type 2 diabetes mellitus with diabetic chronic kidney disease: Secondary | ICD-10-CM | POA: Diagnosis present

## 2023-05-21 DIAGNOSIS — I5022 Chronic systolic (congestive) heart failure: Secondary | ICD-10-CM | POA: Diagnosis present

## 2023-05-21 DIAGNOSIS — Z515 Encounter for palliative care: Secondary | ICD-10-CM | POA: Diagnosis not present

## 2023-05-21 DIAGNOSIS — R918 Other nonspecific abnormal finding of lung field: Secondary | ICD-10-CM | POA: Diagnosis not present

## 2023-05-21 DIAGNOSIS — J1008 Influenza due to other identified influenza virus with other specified pneumonia: Secondary | ICD-10-CM | POA: Diagnosis present

## 2023-05-21 DIAGNOSIS — R0902 Hypoxemia: Secondary | ICD-10-CM | POA: Diagnosis not present

## 2023-05-21 DIAGNOSIS — Z1152 Encounter for screening for COVID-19: Secondary | ICD-10-CM | POA: Diagnosis not present

## 2023-05-21 DIAGNOSIS — J9601 Acute respiratory failure with hypoxia: Secondary | ICD-10-CM | POA: Diagnosis present

## 2023-05-21 DIAGNOSIS — D6949 Other primary thrombocytopenia: Secondary | ICD-10-CM | POA: Diagnosis present

## 2023-05-21 DIAGNOSIS — J101 Influenza due to other identified influenza virus with other respiratory manifestations: Secondary | ICD-10-CM | POA: Diagnosis not present

## 2023-05-21 DIAGNOSIS — I2489 Other forms of acute ischemic heart disease: Secondary | ICD-10-CM | POA: Diagnosis not present

## 2023-05-21 LAB — COMPREHENSIVE METABOLIC PANEL
ALT: 777 U/L — ABNORMAL HIGH (ref 0–44)
AST: 1475 U/L — ABNORMAL HIGH (ref 15–41)
Albumin: 2.2 g/dL — ABNORMAL LOW (ref 3.5–5.0)
Alkaline Phosphatase: 51 U/L (ref 38–126)
Anion gap: 10 (ref 5–15)
BUN: 39 mg/dL — ABNORMAL HIGH (ref 8–23)
CO2: 22 mmol/L (ref 22–32)
Calcium: 7.7 mg/dL — ABNORMAL LOW (ref 8.9–10.3)
Chloride: 105 mmol/L (ref 98–111)
Creatinine, Ser: 3.43 mg/dL — ABNORMAL HIGH (ref 0.44–1.00)
GFR, Estimated: 13 mL/min — ABNORMAL LOW (ref >60)
Glucose, Bld: 225 mg/dL — ABNORMAL HIGH (ref 70–99)
Potassium: 4.5 mmol/L (ref 3.5–5.1)
Sodium: 137 mmol/L (ref 135–145)
Total Bilirubin: 1.5 mg/dL — ABNORMAL HIGH (ref 0.0–1.2)
Total Protein: 5.7 g/dL — ABNORMAL LOW (ref 6.5–8.1)

## 2023-05-21 LAB — CBC
HCT: 37.4 % (ref 36.0–46.0)
Hemoglobin: 11.4 g/dL — ABNORMAL LOW (ref 12.0–15.0)
MCH: 28.3 pg (ref 26.0–34.0)
MCHC: 30.5 g/dL (ref 30.0–36.0)
MCV: 92.8 fL (ref 80.0–100.0)
Platelets: 146 10*3/uL — ABNORMAL LOW (ref 150–400)
RBC: 4.03 MIL/uL (ref 3.87–5.11)
RDW: 17.4 % — ABNORMAL HIGH (ref 11.5–15.5)
WBC: 6.2 10*3/uL (ref 4.0–10.5)
nRBC: 0 % (ref 0.0–0.2)

## 2023-05-21 LAB — LACTIC ACID, PLASMA
Lactic Acid, Venous: 2.5 mmol/L (ref 0.5–1.9)
Lactic Acid, Venous: 2.9 mmol/L (ref 0.5–1.9)
Lactic Acid, Venous: 3.4 mmol/L (ref 0.5–1.9)
Lactic Acid, Venous: 4.2 mmol/L (ref 0.5–1.9)
Lactic Acid, Venous: 5.2 mmol/L (ref 0.5–1.9)

## 2023-05-21 LAB — I-STAT ARTERIAL BLOOD GAS, ED
Acid-base deficit: 7 mmol/L — ABNORMAL HIGH (ref 0.0–2.0)
Bicarbonate: 23 mmol/L (ref 20.0–28.0)
Calcium, Ion: 1.19 mmol/L (ref 1.15–1.40)
HCT: 37 % (ref 36.0–46.0)
Hemoglobin: 12.6 g/dL (ref 12.0–15.0)
O2 Saturation: 91 %
Patient temperature: 98
Potassium: 4.5 mmol/L (ref 3.5–5.1)
Sodium: 138 mmol/L (ref 135–145)
TCO2: 25 mmol/L (ref 22–32)
pCO2 arterial: 68.2 mm[Hg] (ref 32–48)
pH, Arterial: 7.134 — CL (ref 7.35–7.45)
pO2, Arterial: 82 mm[Hg] — ABNORMAL LOW (ref 83–108)

## 2023-05-21 LAB — GLUCOSE, CAPILLARY
Glucose-Capillary: 186 mg/dL — ABNORMAL HIGH (ref 70–99)
Glucose-Capillary: 173 mg/dL — ABNORMAL HIGH (ref 70–99)
Glucose-Capillary: 167 mg/dL — ABNORMAL HIGH (ref 70–99)
Glucose-Capillary: 153 mg/dL — ABNORMAL HIGH (ref 70–99)
Glucose-Capillary: 149 mg/dL — ABNORMAL HIGH (ref 70–99)

## 2023-05-21 LAB — POCT I-STAT 7, (LYTES, BLD GAS, ICA,H+H)
Acid-base deficit: 9 mmol/L — ABNORMAL HIGH (ref 0.0–2.0)
Acid-base deficit: 8 mmol/L — ABNORMAL HIGH (ref 0.0–2.0)
Bicarbonate: 20.2 mmol/L (ref 20.0–28.0)
Bicarbonate: 19.6 mmol/L — ABNORMAL LOW (ref 20.0–28.0)
Calcium, Ion: 1.12 mmol/L — ABNORMAL LOW (ref 1.15–1.40)
Calcium, Ion: 1.01 mmol/L — ABNORMAL LOW (ref 1.15–1.40)
HCT: 38 % (ref 36.0–46.0)
HCT: 37 % (ref 36.0–46.0)
Hemoglobin: 12.9 g/dL (ref 12.0–15.0)
Hemoglobin: 12.6 g/dL (ref 12.0–15.0)
O2 Saturation: 83 %
O2 Saturation: 88 %
Patient temperature: 37.9
Potassium: 4.4 mmol/L (ref 3.5–5.1)
Potassium: 4.5 mmol/L (ref 3.5–5.1)
Sodium: 134 mmol/L — ABNORMAL LOW (ref 135–145)
Sodium: 137 mmol/L (ref 135–145)
TCO2: 22 mmol/L (ref 22–32)
TCO2: 21 mmol/L — ABNORMAL LOW (ref 22–32)
pCO2 arterial: 59.2 mm[Hg] — ABNORMAL HIGH (ref 32–48)
pCO2 arterial: 49.6 mm[Hg] — ABNORMAL HIGH (ref 32–48)
pH, Arterial: 7.141 — CL (ref 7.35–7.45)
pH, Arterial: 7.211 — ABNORMAL LOW (ref 7.35–7.45)
pO2, Arterial: 62 mm[Hg] — ABNORMAL LOW (ref 83–108)
pO2, Arterial: 69 mm[Hg] — ABNORMAL LOW (ref 83–108)

## 2023-05-21 LAB — MAGNESIUM: Magnesium: 2.1 mg/dL (ref 1.7–2.4)

## 2023-05-21 LAB — URINALYSIS, ROUTINE W REFLEX MICROSCOPIC
Bilirubin Urine: NEGATIVE
Glucose, UA: NEGATIVE mg/dL
Ketones, ur: NEGATIVE mg/dL
Leukocytes,Ua: NEGATIVE
Nitrite: NEGATIVE
Protein, ur: 100 mg/dL — AB
Specific Gravity, Urine: 1.016 (ref 1.005–1.030)
pH: 5 (ref 5.0–8.0)

## 2023-05-21 LAB — MRSA NEXT GEN BY PCR, NASAL: MRSA by PCR Next Gen: NOT DETECTED

## 2023-05-21 LAB — CBG MONITORING, ED: Glucose-Capillary: 200 mg/dL — ABNORMAL HIGH (ref 70–99)

## 2023-05-21 LAB — PROCALCITONIN: Procalcitonin: 67.48 ng/mL

## 2023-05-21 MED ORDER — ORAL CARE MOUTH RINSE
15.0000 mL | OROMUCOSAL | Status: DC
  Administered 2023-05-21 – 2023-05-24 (×44): 15 mL via OROMUCOSAL

## 2023-05-21 MED ORDER — FUROSEMIDE 10 MG/ML IJ SOLN
80.0000 mg | Freq: Once | INTRAMUSCULAR | Status: AC
Start: 2023-05-21 — End: 2023-05-21
  Administered 2023-05-21: 80 mg via INTRAVENOUS
  Filled 2023-05-21: qty 8

## 2023-05-21 MED ORDER — SODIUM CHLORIDE 0.9% FLUSH
10.0000 mL | Freq: Two times a day (BID) | INTRAVENOUS | Status: DC
  Administered 2023-05-21 – 2023-05-23 (×4): 10 mL

## 2023-05-21 MED ORDER — LACTATED RINGERS IV SOLN: INTRAVENOUS | Status: AC

## 2023-05-21 MED ORDER — CHLORHEXIDINE GLUCONATE CLOTH 2 % EX PADS
6.0000 | MEDICATED_PAD | Freq: Every day | CUTANEOUS | Status: DC
  Administered 2023-05-21 – 2023-06-04 (×18): 6 via TOPICAL

## 2023-05-21 MED ORDER — INSULIN ASPART 100 UNIT/ML IJ SOLN
2.0000 [IU] | INTRAMUSCULAR | Status: DC
  Administered 2023-05-21: 2 [IU] via SUBCUTANEOUS
  Administered 2023-05-21 – 2023-05-22 (×4): 4 [IU] via SUBCUTANEOUS
  Administered 2023-05-22: 6 [IU] via SUBCUTANEOUS
  Administered 2023-05-22: 2 [IU] via SUBCUTANEOUS

## 2023-05-21 MED ORDER — OSELTAMIVIR PHOSPHATE 30 MG PO CAPS
30.0000 mg | ORAL_CAPSULE | ORAL | Status: DC
  Administered 2023-05-22: 30 mg
  Filled 2023-05-21: qty 1

## 2023-05-21 MED ORDER — OSELTAMIVIR PHOSPHATE 30 MG PO CAPS: 30.0000 mg | ORAL_CAPSULE | ORAL | Status: DC

## 2023-05-21 MED ORDER — CEFTRIAXONE SODIUM 2 G IJ SOLR
2.0000 g | INTRAMUSCULAR | Status: DC
  Administered 2023-05-21 – 2023-05-25 (×5): 2 g via INTRAVENOUS
  Filled 2023-05-21 (×6): qty 20

## 2023-05-21 MED ORDER — SODIUM CHLORIDE 0.9% FLUSH: 10.0000 mL | INTRAVENOUS | Status: DC | PRN

## 2023-05-21 MED ORDER — VASOPRESSIN 20 UNITS/100 ML INFUSION FOR SHOCK
0.0000 [IU]/min | INTRAVENOUS | Status: DC
  Administered 2023-05-21 – 2023-05-22 (×5): 0.03 [IU]/min via INTRAVENOUS
  Filled 2023-05-21 (×6): qty 100

## 2023-05-21 MED ORDER — SODIUM BICARBONATE 8.4 % IV SOLN
100.0000 meq | Freq: Once | INTRAVENOUS | Status: AC
  Administered 2023-05-21: 100 meq via INTRAVENOUS
  Filled 2023-05-21: qty 100

## 2023-05-21 MED ORDER — LEVETIRACETAM 250 MG PO TABS
250.0000 mg | ORAL_TABLET | Freq: Two times a day (BID) | ORAL | Status: DC
  Administered 2023-05-21 – 2023-05-22 (×3): 250 mg via ORAL
  Filled 2023-05-21 (×3): qty 1

## 2023-05-21 MED ORDER — GABAPENTIN 250 MG/5ML PO SOLN
200.0000 mg | Freq: Two times a day (BID) | ORAL | Status: DC
  Administered 2023-05-21 – 2023-06-04 (×25): 200 mg
  Filled 2023-05-21 (×31): qty 4

## 2023-05-21 MED ORDER — ORAL CARE MOUTH RINSE: 15.0000 mL | OROMUCOSAL | Status: DC | PRN

## 2023-05-21 MED ORDER — LACTATED RINGERS IV BOLUS
1000.0000 mL | Freq: Once | INTRAVENOUS | Status: AC
  Administered 2023-05-21: 1000 mL via INTRAVENOUS

## 2023-05-21 MED ORDER — SODIUM CHLORIDE 0.9 % IV SOLN
100.0000 mg | Freq: Two times a day (BID) | INTRAVENOUS | Status: DC
  Administered 2023-05-21 – 2023-05-26 (×11): 100 mg via INTRAVENOUS
  Filled 2023-05-21 (×12): qty 100

## 2023-05-21 MED ORDER — ACETAMINOPHEN 160 MG/5ML PO SOLN: 650.0000 mg | Freq: Four times a day (QID) | ORAL | Status: DC | PRN

## 2023-05-21 MED ORDER — BUSPIRONE HCL 10 MG PO TABS
5.0000 mg | ORAL_TABLET | Freq: Two times a day (BID) | ORAL | Status: DC
  Administered 2023-05-21 – 2023-06-04 (×25): 5 mg
  Filled 2023-05-21 (×27): qty 1

## 2023-05-21 MED ORDER — HYDROCORTISONE SOD SUC (PF) 100 MG IJ SOLR
100.0000 mg | Freq: Two times a day (BID) | INTRAMUSCULAR | Status: DC
  Administered 2023-05-21 – 2023-05-22 (×4): 100 mg via INTRAVENOUS
  Filled 2023-05-21 (×4): qty 2

## 2023-05-21 MED ORDER — HEPARIN (PORCINE) 25000 UT/250ML-% IV SOLN
950.0000 [IU]/h | INTRAVENOUS | Status: DC
  Administered 2023-05-21: 950 [IU]/h via INTRAVENOUS
  Filled 2023-05-21 (×2): qty 250

## 2023-05-21 MED ORDER — ASPIRIN 81 MG PO CHEW
81.0000 mg | CHEWABLE_TABLET | Freq: Every day | ORAL | Status: DC
  Administered 2023-05-21 – 2023-06-04 (×13): 81 mg
  Filled 2023-05-21 (×13): qty 1

## 2023-05-21 MED ORDER — INSULIN GLARGINE 100 UNIT/ML ~~LOC~~ SOLN
10.0000 [IU] | Freq: Every day | SUBCUTANEOUS | Status: DC
  Administered 2023-05-21 – 2023-05-26 (×6): 10 [IU] via SUBCUTANEOUS
  Filled 2023-05-21 (×6): qty 0.1

## 2023-05-21 MED ORDER — FAMOTIDINE IN NACL 20-0.9 MG/50ML-% IV SOLN
20.0000 mg | INTRAVENOUS | Status: DC
  Administered 2023-05-21 – 2023-05-24 (×4): 20 mg via INTRAVENOUS
  Filled 2023-05-21 (×4): qty 50

## 2023-05-21 MED ORDER — SERTRALINE HCL 50 MG PO TABS
50.0000 mg | ORAL_TABLET | Freq: Every day | ORAL | Status: DC
  Administered 2023-05-21 – 2023-06-03 (×12): 50 mg
  Filled 2023-05-21 (×13): qty 1

## 2023-05-21 MED ORDER — POLYETHYLENE GLYCOL 3350 17 G PO PACK: 17.0000 g | PACK | Freq: Every day | ORAL | Status: DC | PRN

## 2023-05-21 MED ORDER — NOREPINEPHRINE 16 MG/250ML-% IV SOLN
0.0000 ug/min | INTRAVENOUS | Status: DC
  Administered 2023-05-21: 27 ug/min via INTRAVENOUS
  Administered 2023-05-21: 30 ug/min via INTRAVENOUS
  Administered 2023-05-22: 18 ug/min via INTRAVENOUS
  Administered 2023-05-23: 8 ug/min via INTRAVENOUS
  Filled 2023-05-21 (×4): qty 250

## 2023-05-21 NOTE — ED Notes (Signed)
 Dr. Allena Katz informed of lactic acid of 2.5.

## 2023-05-21 NOTE — Progress Notes (Signed)
 RRTx2 attempted aline insertion without success. MD made aware.   RT obtained ABG and reported results to MD. Results as follows:7.14/59.2/62/20.2 sat 83%.  MD increased RR to 25 @0948 .  No new orders for RT at this time. RT will continue to monitor.

## 2023-05-21 NOTE — Progress Notes (Signed)
 Pt transported to [facility] w/o complications.

## 2023-05-21 NOTE — Progress Notes (Signed)
 eLink Physician-Brief Progress Note Patient Name: [name] DOB: 01/22/1946 MRN: [id number]   Date of Service  05/21/2023  HPI/Events of Note    eICU Interventions      On Amio drip  Now at 30 ml/hr  HR in 50's We will hold amio drip Once pt's HR 70 or above  We will start her home Amio 200 mg daily per OGT       [facility] 05/21/2023, 2:41 AM

## 2023-05-21 NOTE — TOC Initial Note (Signed)
 Transition of Care [facility]) - Initial/Assessment Note    Patient Details  Name: [name] MRN: [id number] Date of Birth: 15-Apr-1945  [facility] CM/SW Contact:    Elliot Cousin, RN Phone Number: [phone] 05/21/2023, 3:53 PM  Clinical Narrative:                  TOC CM spoke to pt's dtr, Misty Stanley. Lives at home with son, Arlys John and wife. She has RW at home. She was independent pta. Brother or grand-dtr, [place] Noe transport to appts.   Expected Discharge Plan: IP Rehab Facility Barriers to Discharge: Continued Medical Work up   Patient Goals and CMS Choice            Expected Discharge Plan and Services   Discharge Planning Services: CM Consult   Living arrangements for the past 2 months: Single Family Home                                      Prior Living Arrangements/Services Living arrangements for the past 2 months: Single Family Home Lives with:: Adult Children, Minor Children   Do you feel safe going back to the place where you live?: Yes          Current home services: DME (rolling walker)    Activities of Daily Living   ADL Screening (condition at time of admission) Independently performs ADLs?: Yes (appropriate for developmental age) Is the patient deaf or have difficulty hearing?: No Does the patient have difficulty seeing, even when wearing glasses/contacts?: No Does the patient have difficulty concentrating, remembering, or making decisions?: No  Permission Sought/Granted Permission sought to share information with : Case Manager    Share Information with NAME: Nickola Major     Permission granted to share info w Relationship: daughter  Permission granted to share info w Contact Information: [phone]  Emotional Assessment   Attitude/Demeanor/Rapport: Intubated (Following Commands or Not Following Commands)          Admission diagnosis:  Ventricular fibrillation (HCC) [I49.01] Influenza A [J10.1] Acute  respiratory failure with hypoxia (HCC) [J96.01] Patient Active Problem List   Diagnosis Date Noted   Influenza A 05/21/2023   Acute respiratory failure with hypoxia (HCC) 05/20/2023   Ventricular fibrillation (HCC) 10/12/2022   Acute renal failure superimposed on stage 3b chronic kidney disease (HCC) 10/02/2022   Seizure disorder (HCC) 10/02/2022   VT (ventricular tachycardia) (HCC) 06/11/2022   Osteoarthritis 09/01/2021   DM (diabetes mellitus), type 2 with renal complications (HCC) 08/31/2021   Chronic kidney disease, stage 3b (HCC) 08/31/2021   Adjustment disorder with mixed anxiety and depressed mood 01/23/2020   Gastroesophageal reflux disease without esophagitis 03/13/2019   History of cerebrovascular accident 03/13/2019   Polyp of ascending colon    Hypothyroidism 08/02/2017   Personal history of ventricular tachycardia 08/02/2017   Diabetic peripheral neuropathy associated with type 2 diabetes mellitus (HCC) 07/02/2017   Hyperlipidemia 07/02/2017   Colon cancer screening 12/21/2014   Allergic rhinitis 06/06/2014   Arthritis of knee, left 04/30/2014   Spinal stenosis of lumbar region 02/23/2014   Lower back pain 01/23/2014   Bilateral knee pain 01/23/2014   Chronic systolic CHF (congestive heart failure) (HCC) 03/20/2013   HTN (hypertension) 12/15/2012   Obesity, unspecified 12/15/2012   Lumbar spondylosis 01/11/2012   Coronary artery disease 01/31/2011   Implantable cardioverter-defibrillator (ICD) in situ 01/27/2011   Ischemic  cardiomyopathy 01/27/2011   PAF (paroxysmal atrial fibrillation) (HCC) 01/27/2011   PCP:  Pcp, No Pharmacy:   [id number], [place] - [address] [place] [address] Phone: [phone] Fax: [phone]  [facility] [address] [place] [address] Phone: [phone] Fax: [phone]     Social Drivers of Health (SDOH) Social History: SDOH Screenings    Food Insecurity: No Food Insecurity (05/20/2023)  Housing: Low Risk  (05/20/2023)  Transportation Needs: No Transportation Needs (05/20/2023)  Utilities: Not At Risk (05/20/2023)  Depression (PHQ2-9): Low Risk  (07/29/2018)  Financial Resource Strain: Patient Declined (07/17/2022)   Received from [facility]  Physical Activity: Insufficiently Active (07/16/2022)   Received from [facility], [facility]  Social Connections: Socially Isolated (05/20/2023)  Stress: Stress Concern Present (07/16/2022)   Received from [facility], [facility]  Tobacco Use: Medium Risk (05/20/2023)   SDOH Interventions:     Readmission Risk Interventions    10/06/2022   10:37 AM  Readmission Risk Prevention Plan  PCP or Specialist Appt within 5-7 Days Complete  Home Care Screening Complete  Medication Review (RN CM) Complete

## 2023-05-21 NOTE — Plan of Care (Signed)
   Problem: Health Behavior/Discharge Planning: Goal: Ability to manage health-related needs will improve Outcome: Progressing

## 2023-05-21 NOTE — ED Notes (Signed)
 Received verbal order from Dr. Bea Laura to stop amio drip. Will continue to monitor HR.

## 2023-05-21 NOTE — Progress Notes (Signed)
 RT attempted to obtain ABG without success. RN aware, RT to make MD aware. RT will continue to monitor.

## 2023-05-21 NOTE — Progress Notes (Addendum)
  Patient Name: [name] Date of Encounter: 05/21/2023  Primary Cardiologist: [name], MD Electrophysiologist: Regan Lemming, MD  Interval Summary   Intubated overnight  Flu A positive  No VT on IV amio  On NE 30 mcg / Fentanyl 50 mcg   Vital Signs    Vitals:   05/21/23 0515 05/21/23 0530 05/21/23 0545 05/21/23 0600  BP: (!) 109/44 (!) 113/49 (!) 116/47 (!) 118/48  Pulse: 66 65 60 (!) 59  Resp:      Temp:      TempSrc:      SpO2: 95% 95% 95% 95%  Weight:      Height:        Intake/Output Summary (Last 24 hours) at 05/21/2023 0454 Last data filed at 05/21/2023 0600 Gross per 24 hour  Intake 2092.01 ml  Output 300 ml  Net 1792.01 ml   Filed Weights   05/20/23 1200 05/21/23 0350  Weight: 82.6 kg 84.5 kg    Physical Exam    GEN- ill appearing adult female on vent in NAD   Lungs- non-labored on vent, coarse breath sounds bilaterally  Cardiac- Regular rate and rhythm, no murmurs, rubs or gallops GI- soft, ND, + BS Extremities- no clubbing or cyanosis. No edema  Telemetry    SR 60's overnight, no VT (personally reviewed)  Hospital Course    [name] is a 78 y.o. female with PMH of VT, HFrEF (LVEF 25-30%) / ICM, G1DD, HLD, CAD, AF & hypothyroidism admitted 05/20/23 for VT. Found to be Influenza A positive with CXR showing patchy infiltrates on R. Troponin 339. Pt had progressive hypoxic respiratory failure and required intubation around 1030 pm on 2/27 for ventilatory support.   Assessment & Plan    Ventricular Tachycardia  Episodes in ER s/p ATP -continue amiodarone IV -follow electrolytes closely, goal K+>4, Mg+>2   Paroxysmal AF Atrial Flutter  Dual tachycardia  -continue amiodarone  -OAC for stroke prophylaxis  -amiodarone as above   Secondary Hypercoagulable State  -hold xarelto while in ICU  -begin heparin infusion for AF, hx of mobile papillary muscle mass in EP study   AKI on CKD  -per PCCM  Elevated LFT's -per  PCCM    Hyperglycemia  -per PCCM   For questions or updates, please contact [facility] Please consult [email or web address] for contact info under Cardiology/STEMI.  Signed, Canary Brim, NP-C, AGACNP-BC [facility] - Electrophysiology  05/21/2023, 6:52 AM  I have seen and examined this patient with Canary Brim.  Agree with above, note added to reflect my findings.  Patient developed hypoxic respiratory failure overnight and required intubation.  She has had no further episodes of ventricular tachycardia.  Currently on IV amiodarone.  GEN: Well nourished, well developed, in no acute distress  HEENT: normal  Neck: no JVD, carotid bruits, or masses Cardiac: RRR; no murmurs, rubs, or gallops,no edema  Respiratory:  clear to auscultation bilaterally, normal work of breathing GI: soft, nontender, nondistended, + BS MS: no deformity or atrophy  Skin: warm and dry, device site well healed Neuro:  Strength and sensation are intact Psych: euthymic mood, full affect   Ventricular tachycardia: Has had multiple episodes treated with ATP.  Currently on IV amiodarone.  Would continue at current dose. Paroxysmal atrial fibrillation/flutter: Continue amiodarone. Secondary hypercoagulable state: Currently on heparin Hypoxic respiratory failure: Plan per primary team Influenza A: Plan per primary team  Marvie Calender M. Amra Shukla MD 05/21/2023 2:14 PM

## 2023-05-21 NOTE — Progress Notes (Addendum)
 PHARMACY - ANTICOAGULATION CONSULT NOTE  Pharmacy Consult for heparin  Indication: atrial fibrillation  Allergies  Allergen Reactions   Veltassa [Patiromer] Diarrhea   Aldactone [Spironolactone] Diarrhea and Nausea And Vomiting   Darvon [Propoxyphene] Other (See Comments)    Indigestion    Mexitil [Mexiletine] Nausea And Vomiting and Other (See Comments)    Tremors Difficulty walking Blurred vision   Phenergan [Promethazine Hcl] Other (See Comments)    Hyperactivity    Ranexa [Ranolazine Er] Diarrhea   Plavix [Clopidogrel Bisulfate] Rash    Patient Measurements: Height: 5\' 2"  (157.5 cm) Weight: 84.5 kg (186 lb 4.6 oz) IBW/kg (Calculated) : 50.1 HEPARIN DW (KG): 68.6   Vital Signs: Temp: 100.1 F (37.8 C) (02/28 0032) Temp Source: Axillary (02/28 0032) BP: 118/48 (02/28 0600) Pulse Rate: 59 (02/28 0600)  Labs: Recent Labs    05/20/23 1223 05/20/23 1259 05/20/23 1340 05/20/23 1440 05/20/23 2208 05/21/23 0057 05/21/23 0346  HGB 13.0  --  13.3  13.3  --  12.2 12.6 11.4*  HCT 40.3  --  39.0  39.0  --  36.0 37.0 37.4  PLT 142*  --   --   --   --   --  146*  CREATININE  --   --  2.60* 2.37*  --   --  3.43*  TROPONINIHS  --  339*  --  417*  --   --   --     Estimated Creatinine Clearance: 14.1 mL/min (A) (by C-G formula based on SCr of 3.43 mg/dL (H)).   Medical History: Past Medical History:  Diagnosis Date   Acute kidney injury superimposed on chronic kidney disease (HCC) 10/02/2022   Allergy    Arthritis    Atrial fibrillation (HCC)    controlled with amiodarone, on coumadin   Chronic renal insufficiency    Chronic systolic CHF (congestive heart failure) (HCC) 03/20/2013   Chronic systolic heart failure (HCC)    Coronary artery disease 01/31/2011   Diabetes mellitus    type 1   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    History of chicken pox    Hyperlipidemia    Hypertension    Ischemic cardiomyopathy    Knee MCL sprain 03/28/2014    Left knee pain 08/30/2012   Lumbar spondylosis 01/11/2012   Pes anserine bursitis 02/23/2014   Sleep apnea    Stroke (HCC) 2010   eye doctor said she had TIA   Suicide attempt by benzodiazepine overdose (HCC) 01/21/2020   UTI (urinary tract infection) 08/03/2013   Ventricular tachycardia (HCC)    Polymorphic    Medications:  Medications Prior to Admission  Medication Sig Dispense Refill Last Dose/Taking   amiodarone (PACERONE) 200 MG tablet Take 1 tablet (200 mg total) by mouth daily. 30 tablet 6 05/20/2023 Morning   aspirin EC 81 MG tablet Take 1 tablet (81 mg total) by mouth daily. Swallow whole. 30 tablet 12 05/20/2023 Morning   busPIRone (BUSPAR) 5 MG tablet Take 1 tablet (5 mg total) by mouth 2 (two) times daily. 60 tablet 1 05/20/2023 Morning   carvedilol (COREG) 25 MG tablet TAKE 1 TABLET (25 MG TOTAL) BY MOUTH TWICE A DAY WITH MEALS 180 tablet 3 05/20/2023 Morning   Cholecalciferol (VITAMIN D-3 PO) Take 1 tablet by mouth daily.   05/20/2023 Morning   dicyclomine (BENTYL) 10 MG capsule TAKE 1 CAPSULE (10 MG TOTAL) BY MOUTH 4 (FOUR) TIMES DAILY BEFORE MEALS AND AT BEDTIME. 120 capsule 1 05/20/2023 Morning   furosemide (LASIX)  20 MG tablet Take 2 tablets (40 mg) by mouth once a day. 60 tablet 11 05/20/2023 Morning   gabapentin (NEURONTIN) 100 MG capsule Take 200 mg by mouth 2 (two) times daily.   05/20/2023 Morning   insulin aspart (NOVOLOG FLEXPEN) 100 UNIT/ML FlexPen Inject up to 15 units daily under skin as advised (Patient taking differently: Inject 4-5 Units into the skin 3 (three) times daily before meals. Inject up to 15 units daily under skin as advised) 15 mL 11 Past Week   insulin degludec (TRESIBA FLEXTOUCH) 100 UNIT/ML FlexTouch Pen Inject 12-14 Units into the skin daily. (Patient taking differently: Inject 14 Units into the skin at bedtime.) 9 mL 3 05/19/2023 Bedtime   levETIRAcetam (KEPPRA XR) 500 MG 24 hr tablet Take 1 tablet (500 mg total) by mouth at bedtime. 90 tablet 3  05/19/2023 Bedtime   losartan (COZAAR) 25 MG tablet Take 25 mg by mouth daily.   05/20/2023 Morning   Multiple Vitamins-Minerals (EYE VITAMINS PO) Take 1 tablet by mouth 2 (two) times daily.   05/20/2023 Morning   nitroGLYCERIN (NITROSTAT) 0.4 MG SL tablet Place 1 tablet (0.4 mg total) under the tongue every 5 (five) minutes as needed for chest pain. 25 tablet 3 Past Month   omeprazole (PRILOSEC) 20 MG capsule Take 1 capsule by mouth daily.   05/20/2023 Morning   Rivaroxaban (XARELTO) 15 MG TABS tablet Take 1 tablet (15 mg total) by mouth daily with supper. (Patient taking differently: Take 15 mg by mouth at bedtime.) 30 tablet 6 05/19/2023 Bedtime   rosuvastatin (CRESTOR) 40 MG tablet Take 1 tablet (40 mg total) by mouth daily. NEEDS FOLLOW UP APPOINTMENT FOR MORE REFILLS (Patient taking differently: Take 40 mg by mouth at bedtime.) 90 tablet 0 05/19/2023 Bedtime   sertraline (ZOLOFT) 50 MG tablet Take 50 mg by mouth at bedtime.   05/19/2023 Bedtime   Continuous Glucose Sensor (FREESTYLE LIBRE 3 SENSOR) MISC 1 each by Does not apply route every 14 (fourteen) days. 6 each 3    magnesium oxide (MAG-OX) 400 (240 Mg) MG tablet TAKE 1 TABLET BY MOUTH TWICE A DAY (Patient not taking: Reported on 05/20/2023) 180 tablet 1 Not Taking   Scheduled:   aspirin  81 mg Per Tube Daily   busPIRone  5 mg Per Tube BID   Chlorhexidine Gluconate Cloth  6 each Topical Daily   gabapentin  200 mg Per Tube Q12H   hydrocortisone sod succinate (SOLU-CORTEF) inj  100 mg Intravenous Q12H   insulin aspart  2-6 Units Subcutaneous Q4H   insulin glargine  10 Units Subcutaneous Daily   ipratropium-albuterol  3 mL Nebulization Q6H   levETIRAcetam  250 mg Oral BID   mouth rinse  15 mL Mouth Rinse Q2H   [START ON 05/22/2023] oseltamivir  30 mg Per Tube QODAY   sertraline  50 mg Per Tube QHS   sodium chloride flush  10-40 mL Intracatheter Q12H    Assessment: 78 yo female here with VT and ICD shock noted with, VDRF and AKI. 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 Pharmacy consulted to dose heparin.   -Hg= 11.4, plt= 146  Goal of Therapy:  Heparin level 0.3-0.7 units/ml aPTT 66-102 seconds Monitor platelets by anticoagulation protocol: Yes   Plan:  -Start heparin 950 units.hr at 10pm -Heparin level and aPTT in 8 hrs  Harland German, PharmD Clinical Pharmacist **Pharmacist  phone directory can now be found on [email or web address] ([email or web address]).  Listed under [facility].

## 2023-05-21 NOTE — Progress Notes (Signed)
 eLink Physician-Brief Progress Note Patient Name: [name] DOB: May 16, 1945 MRN: [id number]   Date of Service  05/21/2023  HPI/Events of Note    eICU Interventions     Pneumonia AKI Acute hypoxemic resp failure Lactic acidosis  IVF Pressors Antimicribials Mech vent     Massie Maroon 05/21/2023, 1:47 AM

## 2023-05-21 NOTE — Consult Note (Signed)
 NAME:  [name], MRN:  [id number], DOB:  12-22-1945, LOS: 0 ADMISSION DATE:  05/20/2023, CONSULTATION DATE:  05/20/23 REFERRING MD:  Synetta Fail, MD , CHIEF COMPLAINT:  N/V/Abd pain / Resp failure   History of Present Illness:  78 year old female patient with multiple comorbidities, with nausea and vomiting for few days, issues with the defibrillator fired multiple times, admitted for acute hypoxic respiratory failure, flu a positive, multifocal pneumonia superimposed infection, tachyarrhythmias with underlying V. tach V-fib A-fib a flutter, has ICD in place. Condition declined emergency room thus critical care asked to see the patient since she was being intubated in the resuscitation room. Will admit to ICU, continue with medical management.  Pertinent  Medical History   has a past medical history of Acute kidney injury superimposed on chronic kidney disease (HCC) (10/02/2022), Allergy, Arthritis, Atrial fibrillation (HCC), Chronic renal insufficiency, Chronic systolic CHF (congestive heart failure) (HCC) (03/20/2013), Chronic systolic heart failure (HCC), Coronary artery disease (01/31/2011), Diabetes mellitus, 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, History of chicken pox, Hyperlipidemia, Hypertension, Ischemic cardiomyopathy, Knee MCL sprain (03/28/2014), Left knee pain (08/30/2012), Lumbar spondylosis (01/11/2012), Pes anserine bursitis (02/23/2014), Sleep apnea, Stroke (HCC) (2010), Suicide attempt by benzodiazepine overdose (HCC) (01/21/2020), UTI (urinary tract infection) (08/03/2013), and Ventricular tachycardia (HCC).   Significant Hospital Events: Including procedures, antibiotic start and stop dates in addition to other pertinent events     Interim History / Subjective:  HPI: [name] is a 78 y.o. female with medical history significant of hypertension, hyperlipidemia, diabetes, neuropathy, hypothyroidism, CKD 3B, CAD, GERD, CVA, chronic systolic CHF,  depression, anxiety, ventricular tachycardia, ventricular fibrillation, atrial fibrillation, status post ICD, obesity, seizure disorder, low back pain, spinal stenosis presenting after feeling unwell for several days and recent ICD discharges.   Patient reports that she has been unwell for possible days.  She is experience nausea vomiting and abdominal pain.  She reports that her defibrillator fired multiple times today.  ED provider confirmed this with the [facility] representative who noted that the device did fire 3 times a day for ventricular fibrillation.  Also noted periods of slow ventricular tachycardia as well as atrial fibrillation.   Patient denies fevers, chills.   ED Course: Vital signs in the ED notable for blood pressure initially in the 70 systolic now improved to the 120s.  Respiratory rate in the 20s.  Requiring 3 L to maintain saturations.  Lab workup included CMP with potassium 3.2, bicarb 18, gap 17, BUN 32, creatinine elevated 2.37 baseline of 2, glucose 2 1, calcium 7.9, protein 6.0, albumin 2.5, T. bili 1.9, AST 1076, ALT 663.  CBC with platelets 142.  BNP elevated to 2548.  Troponin trend 339, 417.  Lipase normal.  Respiratory panel positive for flu A.  Lactic acid pending.  Urinalysis pending.  Blood cultures pending.  VBG with normal pH pCO2 40.7.  Magnesium pending.  Chest x-ray with new bilateral opacities consistent with pneumonia.  CT of the abdomen pelvis showing no acute normality in the abdomen pelvis, bilateral opacity at the lung bases, left renal cysts.  Patient received ceftriaxone, doxycycline, Tamiflu in the ED.  Started on amiodarone infusion.  Also received morphine, 1 L IV fluids and 40 mEq p.o. potassium x 2.  Cardiology consulted and recommended amiodarone fusion, repletion of magnesium and potassium as needed, adding back beta-blocker as blood pressure allows  Objective   Blood pressure (!) 108/47, pulse 68, temperature 100.1 F (37.8 C), temperature source  Axillary, resp.  rate 16, height 5\' 2"  (1.575 m), weight 82.6 kg, SpO2 95%.    Vent Mode: PRVC FiO2 (%):  [80 %] 80 % Set Rate:  [15 bmp] 15 bmp Vt Set:  [360 mL] 360 mL PEEP:  [8 cmH20] 8 cmH20  No intake or output data in the 24 hours ending 05/21/23 0033 Filed Weights   05/20/23 1200  Weight: 82.6 kg    Examination: Physical Exam Vitals and nursing note reviewed.  Constitutional:      Appearance: She is ill-appearing.     Comments: Intubated / sedated  HENT:     Head: Normocephalic and atraumatic.     Mouth/Throat:     Mouth: Mucous membranes are moist.  Eyes:     Extraocular Movements: Extraocular movements intact.     Pupils: Pupils are equal, round, and reactive to light.  Cardiovascular:     Rate and Rhythm: Tachycardia present. Rhythm irregular.  Pulmonary:     Breath sounds: Rhonchi present.     Comments: On vent Abdominal:     General: Abdomen is flat. Bowel sounds are normal.     Palpations: Abdomen is soft.  Skin:    General: Skin is warm and dry.  Neurological:     Comments: Intubate/sedated      Resolved Hospital Problem list     Assessment & Plan:  Acute hypoxic respiratory failure on vent - 05/20/23 - 2/2 influenza A pneumonia and superimposed multifocal pna - resume tamiflu, vanc/cefepime, f/u culture results - vent bundle, mgmt - f/u ABG, CXR  Tachyarrhythmia - VT, VF, AF, Afl  -- multifactorial - has known cardiac history - also probably precipitated by sepsis - on xarelto - f/u Cardio and EP eval, appreciated  AKI on CKD3B - resume IV fluids  Appreciate ER assistance Continue medical management eLink on board Amion - number [phone] Further recs pending clinical course  Best Practice (right click and "Reselect all SmartList Selections" daily)   Diet/type: NPO DVT prophylaxis DOAC Pressure ulcer(s): N/A GI prophylaxis: H2B Lines: right internal jugular CVL - 05/20/23 Foley:  N/A Code Status:  full code Last date of  multidisciplinary goals of care discussion   Labs   CBC: Recent Labs  Lab 05/20/23 1223 05/20/23 1340 05/20/23 2208  WBC 8.7  --   --   HGB 13.0 13.3  13.3 12.2  HCT 40.3 39.0  39.0 36.0  MCV 89.0  --   --   PLT 142*  --   --     Basic Metabolic Panel: Recent Labs  Lab 05/20/23 1340 05/20/23 1440 05/20/23 2208  NA 139  140 138 139  K 3.8  3.9 3.2* 4.3  CL 104 103  --   CO2  --  18*  --   GLUCOSE 221* 201*  --   BUN 41* 32*  --   CREATININE 2.60* 2.37*  --   CALCIUM  --  7.9*  --   MG  --  1.6*  --    GFR: Estimated Creatinine Clearance: 20.1 mL/min (A) (by C-G formula based on SCr of 2.37 mg/dL (H)). Recent Labs  Lab 05/20/23 1223 05/20/23 2100 05/20/23 2329  PROCALCITON  --  44.56  --   WBC 8.7  --   --   LATICACIDVEN  --   --  2.5*    Liver Function Tests: Recent Labs  Lab 05/20/23 1440  AST 1,076*  ALT 663*  ALKPHOS 59  BILITOT 1.9*  PROT 6.0*  ALBUMIN 2.5*  Recent Labs  Lab 05/20/23 1440  LIPASE 24   No results for input(s): "AMMONIA" in the last 168 hours.  ABG    Component Value Date/Time   HCO3 21.6 05/20/2023 2208   TCO2 23 05/20/2023 2208   ACIDBASEDEF 6.0 (H) 05/20/2023 2208   O2SAT 90 05/20/2023 2208     Coagulation Profile: No results for input(s): "INR", "PROTIME" in the last 168 hours.  Cardiac Enzymes: No results for input(s): "CKTOTAL", "CKMB", "CKMBINDEX", "TROPONINI" in the last 168 hours.  HbA1C: Hgb A1c MFr Bld  Date/Time Value Ref Range Status  05/20/2023 09:00 PM 7.7 (H) 4.8 - 5.6 % Final    Comment:    (NOTE) Pre diabetes:          5.7%-6.4%  Diabetes:              >6.4%  Glycemic control for   <7.0% adults with diabetes   10/02/2022 03:54 PM 9.2 (H) 4.8 - 5.6 % Final    Comment:    (NOTE) Pre diabetes:          5.7%-6.4%  Diabetes:              >6.4%  Glycemic control for   <7.0% adults with diabetes     CBG: Recent Labs  Lab 05/20/23 2109  GLUCAP 201*    Review of Systems:    Review of Systems - Negative except   History obtained from unobtainable from patient due to mental status   Past Medical History:  She,  has a past medical history of Acute kidney injury superimposed on chronic kidney disease (HCC) (10/02/2022), Allergy, Arthritis, Atrial fibrillation (HCC), Chronic renal insufficiency, Chronic systolic CHF (congestive heart failure) (HCC) (03/20/2013), Chronic systolic heart failure (HCC), Coronary artery disease (01/31/2011), Diabetes mellitus, 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, History of chicken pox, Hyperlipidemia, Hypertension, Ischemic cardiomyopathy, Knee MCL sprain (03/28/2014), Left knee pain (08/30/2012), Lumbar spondylosis (01/11/2012), Pes anserine bursitis (02/23/2014), Sleep apnea, Stroke (HCC) (2010), Suicide attempt by benzodiazepine overdose (HCC) (01/21/2020), UTI (urinary tract infection) (08/03/2013), and Ventricular tachycardia (HCC).   Surgical History:   Past Surgical History:  Procedure Laterality Date   ABDOMINAL HYSTERECTOMY  1995   CARDIAC DEFIBRILLATOR PLACEMENT     CHOLECYSTECTOMY  1985   COLONOSCOPY WITH PROPOFOL N/A 02/24/2018   Procedure: COLONOSCOPY WITH PROPOFOL;  Surgeon: Rachael Fee, MD;  Location: [facility] ENDOSCOPY;  Service: Endoscopy;  Laterality: N/A;   ICD  2003/2007   implanted by Dr Alanda Amass, most recent generator change 2/13 by Dr Johney Frame, Analyze ST study patient   ICD GENERATOR CHANGEOUT N/A 04/11/2020   Procedure: ICD GENERATOR CHANGEOUT;  Surgeon: Hillis Range, MD;  Location: [facility] INVASIVE CV LAB;  Service: Cardiovascular;  Laterality: N/A;   IMPLANTABLE CARDIOVERTER DEFIBRILLATOR (ICD) GENERATOR CHANGE N/A 05/05/2011   Procedure: ICD GENERATOR CHANGE;  Surgeon: Hillis Range, MD;  Location: [facility] CATH LAB;  Service: Cardiovascular;  Laterality: N/A;   LEFT HEART CATH AND CORONARY ANGIOGRAPHY N/A 10/14/2022   Procedure: LEFT HEART CATH AND CORONARY ANGIOGRAPHY;  Surgeon: Kathleene Hazel, MD;   Location: [facility] INVASIVE CV LAB;  Service: Cardiovascular;  Laterality: N/A;   LEFT HEART CATHETERIZATION WITH CORONARY ANGIOGRAM N/A 01/30/2011   Procedure: LEFT HEART CATHETERIZATION WITH CORONARY ANGIOGRAM;  Surgeon: Laurey Morale, MD;  Location: [facility] CATH LAB;  Service: Cardiovascular;  Laterality: N/A;   POLYPECTOMY  02/24/2018   Procedure: POLYPECTOMY;  Surgeon: Rachael Fee, MD;  Location: [facility] ENDOSCOPY;  Service: Endoscopy;;   Ma Rings  1980   V TACH ABLATION N/A 04/02/2023   Procedure: V TACH ABLATION;  Surgeon: Regan Lemming, MD;  Location: [facility] INVASIVE CV LAB;  Service: Cardiovascular;  Laterality: N/A;     Social History:   reports that she quit smoking about 27 years ago. Her smoking use included cigarettes. She has never been exposed to tobacco smoke. She has never used smokeless tobacco. She reports that she does not drink alcohol and does not use drugs.   Family History:  Her family history includes Breast cancer in her sister; Colon cancer in her maternal aunt; Diabetes in her mother; Early death (age of onset: 23) in her brother; Heart attack in her father; Heart disease in her father and mother; Hyperlipidemia in her mother; Hypertension in her father and mother; Irritable bowel syndrome in her sister; Lung cancer in her sister; Seizures in her granddaughter. There is no history of Esophageal cancer or Colon polyps.   Allergies Allergies  Allergen Reactions   Veltassa [Patiromer] Diarrhea   Aldactone [Spironolactone] Diarrhea and Nausea And Vomiting   Darvon [Propoxyphene] Other (See Comments)    Indigestion    Mexitil [Mexiletine] Nausea And Vomiting and Other (See Comments)    Tremors Difficulty walking Blurred vision   Phenergan [Promethazine Hcl] Other (See Comments)    Hyperactivity    Ranexa [Ranolazine Er] Diarrhea   Plavix [Clopidogrel Bisulfate] Rash     Home Medications  Prior to Admission medications   Medication Sig Start Date End Date Taking?  Authorizing Provider  amiodarone (PACERONE) 200 MG tablet Take 1 tablet (200 mg total) by mouth daily. 11/15/22  Yes Jeanella Craze, NP  aspirin EC 81 MG tablet Take 1 tablet (81 mg total) by mouth daily. Swallow whole. 10/17/22  Yes Ollis, Brandi L, NP  busPIRone (BUSPAR) 5 MG tablet Take 1 tablet (5 mg total) by mouth 2 (two) times daily. 05/20/21  Yes Milford, Anderson Malta, FNP  carvedilol (COREG) 25 MG tablet TAKE 1 TABLET (25 MG TOTAL) BY MOUTH TWICE A DAY WITH MEALS 04/15/23  Yes Camnitz, Andree Coss, MD  Cholecalciferol (VITAMIN D-3 PO) Take 1 tablet by mouth daily.   Yes [provider]  dicyclomine (BENTYL) 10 MG capsule TAKE 1 CAPSULE (10 MG TOTAL) BY MOUTH 4 (FOUR) TIMES DAILY BEFORE MEALS AND AT BEDTIME. 02/09/20  Yes Corwin Levins, MD  furosemide (LASIX) 20 MG tablet Take 2 tablets (40 mg) by mouth once a day. 03/12/23  Yes Laurey Morale, MD  gabapentin (NEURONTIN) 100 MG capsule Take 200 mg by mouth 2 (two) times daily.   Yes [provider]  insulin aspart (NOVOLOG FLEXPEN) 100 UNIT/ML FlexPen Inject up to 15 units daily under skin as advised Patient taking differently: Inject 4-5 Units into the skin 3 (three) times daily before meals. Inject up to 15 units daily under skin as advised 06/24/22  Yes Carlus Pavlov, MD  insulin degludec (TRESIBA FLEXTOUCH) 100 UNIT/ML FlexTouch Pen Inject 12-14 Units into the skin daily. Patient taking differently: Inject 14 Units into the skin at bedtime. 01/21/23  Yes Carlus Pavlov, MD  levETIRAcetam (KEPPRA XR) 500 MG 24 hr tablet Take 1 tablet (500 mg total) by mouth at bedtime. 12/22/22 12/17/23 Yes Camara, Amalia Hailey, MD  losartan (COZAAR) 25 MG tablet Take 25 mg by mouth daily.   Yes [provider]  Multiple Vitamins-Minerals (EYE VITAMINS PO) Take 1 tablet by mouth 2 (two) times daily.   Yes [provider]  nitroGLYCERIN (NITROSTAT) 0.4 MG  SL tablet Place 1 tablet (0.4 mg total) under the tongue every 5 (five)  minutes as needed for chest pain. 12/09/22  Yes Sheilah Pigeon, PA-C  omeprazole (PRILOSEC) 20 MG capsule Take 1 capsule by mouth daily. 10/15/22  Yes [provider]  Rivaroxaban (XARELTO) 15 MG TABS tablet Take 1 tablet (15 mg total) by mouth daily with supper. Patient taking differently: Take 15 mg by mouth at bedtime. 04/02/23  Yes Ollis, Brandi L, NP  rosuvastatin (CRESTOR) 40 MG tablet Take 1 tablet (40 mg total) by mouth daily. NEEDS FOLLOW UP APPOINTMENT FOR MORE REFILLS Patient taking differently: Take 40 mg by mouth at bedtime. 09/17/22  Yes Laurey Morale, MD  sertraline (ZOLOFT) 50 MG tablet Take 50 mg by mouth at bedtime. 09/17/22  Yes [provider]  Continuous Glucose Sensor (FREESTYLE LIBRE 3 SENSOR) MISC 1 each by Does not apply route every 14 (fourteen) days. 11/19/22   Carlus Pavlov, MD  magnesium oxide (MAG-OX) 400 (240 Mg) MG tablet TAKE 1 TABLET BY MOUTH TWICE A DAY Patient not taking: Reported on 05/20/2023 12/19/21   Corwin Levins, MD     Critical care time:   CRITICAL CARE Performed by: Madaline Brilliant DO PCCM   Total critical care time: 55 minutes  Critical care time was exclusive of separately billable procedures and treating other patients.  Critical care was necessary to treat or prevent imminent or life-threatening deterioration.  Critical care was time spent personally by me on the following activities: development of treatment plan with patient and/or surrogate as well as nursing, discussions with consultants, evaluation of patient's response to treatment, examination of patient, obtaining history from patient or surrogate, ordering and performing treatments and interventions, ordering and review of laboratory studies, ordering and review of radiographic studies, pulse oximetry and re-evaluation of patient's condition.

## 2023-05-21 NOTE — Progress Notes (Addendum)
 NAME:  [name], MRN:  [id number], DOB:  23-Oct-1945, LOS: 0 ADMISSION DATE:  05/20/2023, CONSULTATION DATE:  05/20/23 REFERRING MD:  Synetta Fail, MD , CHIEF COMPLAINT:  N/V/Abd pain / Resp failure   History of Present Illness:  HPI: [name] is a 78 y.o. female with medical history significant of hypertension, hyperlipidemia, diabetes, neuropathy, hypothyroidism, CKD 3B, CAD, GERD, CVA, chronic systolic CHF, depression, anxiety, ventricular tachycardia, ventricular fibrillation, atrial fibrillation, status post ICD, obesity, seizure disorder, low back pain, spinal stenosis presenting after feeling unwell for several days and recent ICD discharges.   Patient reports that she has been unwell for possible days.  She is experience nausea vomiting and abdominal pain.  She reports that her defibrillator fired multiple times today.  ED provider confirmed this with the [facility] representative who noted that the device did fire 3 times a day for ventricular fibrillation.  Also noted periods of slow ventricular tachycardia as well as atrial fibrillation.   Patient denies fevers, chills.   ED Course: Vital signs in the ED notable for blood pressure initially in the 70 systolic now improved to the 120s.  Respiratory rate in the 20s.  Requiring 3 L to maintain saturations.  Lab workup included CMP with potassium 3.2, bicarb 18, gap 17, BUN 32, creatinine elevated 2.37 baseline of 2, glucose 2 1, calcium 7.9, protein 6.0, albumin 2.5, T. bili 1.9, AST 1076, ALT 663.  CBC with platelets 142.  BNP elevated to 2548.  Troponin trend 339, 417.  Lipase normal.  Respiratory panel positive for flu A.  Lactic acid pending.  Urinalysis pending.  Blood cultures pending.  VBG with normal pH pCO2 40.7.  Magnesium pending.  Chest x-ray with new bilateral opacities consistent with pneumonia.  CT of the abdomen pelvis showing no acute normality in the abdomen pelvis, bilateral opacity at the lung bases,  left renal cysts.  Patient received ceftriaxone, doxycycline, Tamiflu in the ED.  Started on amiodarone infusion.  Also received morphine, 1 L IV fluids and 40 mEq p.o. potassium x 2.  Cardiology consulted and recommended amiodarone fusion, repletion of magnesium and potassium as needed, adding back beta-blocker as blood pressure allows  Pertinent  Medical History   has a past medical history of Acute kidney injury superimposed on chronic kidney disease (HCC) (10/02/2022), Allergy, Arthritis, Atrial fibrillation (HCC), Chronic renal insufficiency, Chronic systolic CHF (congestive heart failure) (HCC) (03/20/2013), Chronic systolic heart failure (HCC), Coronary artery disease (01/31/2011), Diabetes mellitus, 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, History of chicken pox, Hyperlipidemia, Hypertension, Ischemic cardiomyopathy, Knee MCL sprain (03/28/2014), Left knee pain (08/30/2012), Lumbar spondylosis (01/11/2012), Pes anserine bursitis (02/23/2014), Sleep apnea, Stroke (HCC) (2010), Suicide attempt by benzodiazepine overdose (HCC) (01/21/2020), UTI (urinary tract infection) (08/03/2013), and Ventricular tachycardia (HCC).   Significant Hospital Events: Including procedures, antibiotic start and stop dates in addition to other pertinent events     Interim History / Subjective:  Patient is on high-dose vasopressor support with Levophed No more episodes of V. Tach Remain acidotic mixed metabolic and respiratory  Objective   Blood pressure (!) 118/48, pulse (!) 59, temperature 100.1 F (37.8 C), temperature source Axillary, resp. rate (!) 7, height 5\' 2"  (1.575 m), weight 84.5 kg, SpO2 91%.    Vent Mode: PRVC FiO2 (%):  [80 %] 80 % Set Rate:  [15 bmp-20 bmp] 18 bmp Vt Set:  [360 mL-400 mL] 400 mL PEEP:  [8 cmH20] 8 cmH20 Plateau Pressure:  [23 cmH20] 23 cmH20  Intake/Output Summary (Last 24 hours) at 05/21/2023 0981 Last data filed at 05/21/2023 0600 Gross per 24 hour  Intake 2092.01  ml  Output 300 ml  Net 1792.01 ml   Filed Weights   05/20/23 1200 05/21/23 0350  Weight: 82.6 kg 84.5 kg    Examination: General: Crtitically ill-appearing elderly obese female, orally intubated HEENT: [place]/AT, eyes anicteric.  ETT and OGT in place Neuro: Sedated, not following commands.  Eyes are closed.  Pupils 3 mm bilateral reactive to light Chest: Bilateral crackles at bases, no wheezes or rhonchi Heart: Regular rate and rhythm, no murmurs or gallops Abdomen: Soft, nondistended, bowel sounds present Skin: No rash  Labs and images reviewed  Resolved Hospital Problem list     Assessment & Plan:  Acute respiratory failure with hypoxia and hypercapnia Influenza pneumonia Probable bilateral superimposed bacterial pneumonia Severe sepsis with septic shock, POA Acute septic encephalopathy Ventricular tachycardia Paroxysmal A-fib/A-flutter AKI on CKD stage IIIb Mixed respiratory and metabolic acidosis Shock liver Lactic acidosis Diabetes type 2, with hyperglycemia Obesity 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  Continue lung protective ventilation Vent setting was adjusted to clear hypercapnia VAP prevention bundle in place PAD protocol with propofol and fentanyl Continue Tamiflu and droplet precautions Continue ceftriaxone and doxycycline Trend lactate Continue vasopressor support with MAP goal 65, currently on Levophed at 30 Will add vasopressin will give 2 A of bicarbonate Continue IV amiodarone infusion Continue IV heparin infusion EP cardiology is following Continue telemetry monitoring Monitor intake and output Avoid nephrotoxic agent Closely monitor LFTs Lactate slightly trended up, watch closely Continue insulin with CBG goal 140-180 Echocardiogram showed EF 30 to 35% with diastolic dysfunction and RV systolic failure   Best Practice (right click and "Reselect all SmartList Selections" daily)   Diet/type: NPO DVT prophylaxis IV  heparin infusion Pressure ulcer(s): N/A GI prophylaxis: H2B Lines: right internal jugular CVL - 05/20/23 Foley: Yes needed Code Status:  full code Last date of multidisciplinary goals of care discussion: Pending  Labs   CBC: Recent Labs  Lab 05/20/23 1223 05/20/23 1340 05/20/23 2208 05/21/23 0057 05/21/23 0346  WBC 8.7  --   --   --  6.2  HGB 13.0 13.3  13.3 12.2 12.6 11.4*  HCT 40.3 39.0  39.0 36.0 37.0 37.4  MCV 89.0  --   --   --  92.8  PLT 142*  --   --   --  146*    Basic Metabolic Panel: Recent Labs  Lab 05/20/23 1340 05/20/23 1440 05/20/23 2208 05/21/23 0057 05/21/23 0346  NA 139  140 138 139 138 137  K 3.8  3.9 3.2* 4.3 4.5 4.5  CL 104 103  --   --  105  CO2  --  18*  --   --  22  GLUCOSE 221* 201*  --   --  225*  BUN 41* 32*  --   --  39*  CREATININE 2.60* 2.37*  --   --  3.43*  CALCIUM  --  7.9*  --   --  7.7*  MG  --  1.6*  --   --  2.1   GFR: Estimated Creatinine Clearance: 14.1 mL/min (A) (by C-G formula based on SCr of 3.43 mg/dL (H)). Recent Labs  Lab 05/20/23 1223 05/20/23 2100 05/20/23 2329 05/21/23 0145 05/21/23 0346  PROCALCITON  --  44.56  --   --  67.48  WBC 8.7  --   --   --  6.2  LATICACIDVEN  --   --  2.5* 2.9*  --     Liver Function Tests: Recent Labs  Lab 05/20/23 1440 05/21/23 0346  AST 1,076* 1,475*  ALT 663* 777*  ALKPHOS 59 51  BILITOT 1.9* 1.5*  PROT 6.0* 5.7*  ALBUMIN 2.5* 2.2*   Recent Labs  Lab 05/20/23 1440  LIPASE 24   No results for input(s): "AMMONIA" in the last 168 hours.  ABG    Component Value Date/Time   PHART 7.134 (LL) 05/21/2023 0057   PCO2ART 68.2 (HH) 05/21/2023 0057   PO2ART 82 (L) 05/21/2023 0057   HCO3 23.0 05/21/2023 0057   TCO2 25 05/21/2023 0057   ACIDBASEDEF 7.0 (H) 05/21/2023 0057   O2SAT 91 05/21/2023 0057     Coagulation Profile: No results for input(s): "INR", "PROTIME" in the last 168 hours.  Cardiac Enzymes: No results for input(s): "CKTOTAL", "CKMB", "CKMBINDEX",  "TROPONINI" in the last 168 hours.  HbA1C: Hgb A1c MFr Bld  Date/Time Value Ref Range Status  05/20/2023 09:00 PM 7.7 (H) 4.8 - 5.6 % Final    Comment:    (NOTE) Pre diabetes:          5.7%-6.4%  Diabetes:              >6.4%  Glycemic control for   <7.0% adults with diabetes   10/02/2022 03:54 PM 9.2 (H) 4.8 - 5.6 % Final    Comment:    (NOTE) Pre diabetes:          5.7%-6.4%  Diabetes:              >6.4%  Glycemic control for   <7.0% adults with diabetes     CBG: Recent Labs  Lab 05/20/23 2109 05/21/23 0110 05/21/23 0337 05/21/23 0752  GLUCAP 201* 200* 186* 173*    The patient is critically ill due to severe sepsis septic shock/AKI/acute respiratory failure/flu pneumonia.  Critical care was necessary to treat or prevent imminent or life-threatening deterioration.  Critical care was time spent personally by me on the following activities: development of treatment plan with patient and/or surrogate as well as nursing, discussions with consultants, evaluation of patient's response to treatment, examination of patient, obtaining history from patient or surrogate, ordering and performing treatments and interventions, ordering and review of laboratory studies, ordering and review of radiographic studies, pulse oximetry, re-evaluation of patient's condition and participation in multidisciplinary rounds.   During this encounter critical care time was devoted to patient care services described in this note for 44 minutes.     Cheri Fowler, MD [facility] See Amion for pager If no response to pager, please call [phone] until 7pm After 7pm, Please call E-link [phone]

## 2023-05-22 DIAGNOSIS — I4901 Ventricular fibrillation: Secondary | ICD-10-CM | POA: Diagnosis not present

## 2023-05-22 DIAGNOSIS — N1832 Chronic kidney disease, stage 3b: Secondary | ICD-10-CM | POA: Diagnosis not present

## 2023-05-22 DIAGNOSIS — N179 Acute kidney failure, unspecified: Secondary | ICD-10-CM | POA: Diagnosis not present

## 2023-05-22 DIAGNOSIS — J09X2 Influenza due to identified novel influenza A virus with other respiratory manifestations: Secondary | ICD-10-CM | POA: Diagnosis not present

## 2023-05-22 DIAGNOSIS — J9601 Acute respiratory failure with hypoxia: Secondary | ICD-10-CM | POA: Diagnosis not present

## 2023-05-22 LAB — CBC
HCT: 32.9 % — ABNORMAL LOW (ref 36.0–46.0)
Hemoglobin: 11 g/dL — ABNORMAL LOW (ref 12.0–15.0)
MCH: 29.3 pg (ref 26.0–34.0)
MCHC: 33.4 g/dL (ref 30.0–36.0)
MCV: 87.5 fL (ref 80.0–100.0)
Platelets: 127 10*3/uL — ABNORMAL LOW (ref 150–400)
RBC: 3.76 MIL/uL — ABNORMAL LOW (ref 3.87–5.11)
RDW: 17.8 % — ABNORMAL HIGH (ref 11.5–15.5)
WBC: 9.2 10*3/uL (ref 4.0–10.5)
nRBC: 0 % (ref 0.0–0.2)

## 2023-05-22 LAB — COMPREHENSIVE METABOLIC PANEL
ALT: 773 U/L — ABNORMAL HIGH (ref 0–44)
AST: 1151 U/L — ABNORMAL HIGH (ref 15–41)
Albumin: 1.9 g/dL — ABNORMAL LOW (ref 3.5–5.0)
Alkaline Phosphatase: 56 U/L (ref 38–126)
Anion gap: 17 — ABNORMAL HIGH (ref 5–15)
BUN: 43 mg/dL — ABNORMAL HIGH (ref 8–23)
CO2: 17 mmol/L — ABNORMAL LOW (ref 22–32)
Calcium: 7 mg/dL — ABNORMAL LOW (ref 8.9–10.3)
Chloride: 99 mmol/L (ref 98–111)
Creatinine, Ser: 3.28 mg/dL — ABNORMAL HIGH (ref 0.44–1.00)
GFR, Estimated: 14 mL/min — ABNORMAL LOW (ref >60)
Glucose, Bld: 211 mg/dL — ABNORMAL HIGH (ref 70–99)
Potassium: 4.1 mmol/L (ref 3.5–5.1)
Sodium: 133 mmol/L — ABNORMAL LOW (ref 135–145)
Total Bilirubin: 2.1 mg/dL — ABNORMAL HIGH (ref 0.0–1.2)
Total Protein: 5.4 g/dL — ABNORMAL LOW (ref 6.5–8.1)

## 2023-05-22 LAB — GLUCOSE, CAPILLARY
Glucose-Capillary: 124 mg/dL — ABNORMAL HIGH (ref 70–99)
Glucose-Capillary: 149 mg/dL — ABNORMAL HIGH (ref 70–99)
Glucose-Capillary: 153 mg/dL — ABNORMAL HIGH (ref 70–99)
Glucose-Capillary: 154 mg/dL — ABNORMAL HIGH (ref 70–99)
Glucose-Capillary: 174 mg/dL — ABNORMAL HIGH (ref 70–99)
Glucose-Capillary: 185 mg/dL — ABNORMAL HIGH (ref 70–99)
Glucose-Capillary: 197 mg/dL — ABNORMAL HIGH (ref 70–99)
Glucose-Capillary: 213 mg/dL — ABNORMAL HIGH (ref 70–99)

## 2023-05-22 LAB — MAGNESIUM: Magnesium: 1.7 mg/dL (ref 1.7–2.4)

## 2023-05-22 LAB — POCT I-STAT 7, (LYTES, BLD GAS, ICA,H+H)
Acid-base deficit: 4 mmol/L — ABNORMAL HIGH (ref 0.0–2.0)
Bicarbonate: 20.9 mmol/L (ref 20.0–28.0)
Calcium, Ion: 0.96 mmol/L — ABNORMAL LOW (ref 1.15–1.40)
HCT: 34 % — ABNORMAL LOW (ref 36.0–46.0)
Hemoglobin: 11.6 g/dL — ABNORMAL LOW (ref 12.0–15.0)
O2 Saturation: 96 %
Patient temperature: 37.1
Potassium: 3.9 mmol/L (ref 3.5–5.1)
Sodium: 136 mmol/L (ref 135–145)
TCO2: 22 mmol/L (ref 22–32)
pCO2 arterial: 38.6 mmHg (ref 32–48)
pH, Arterial: 7.343 — ABNORMAL LOW (ref 7.35–7.45)
pO2, Arterial: 83 mmHg (ref 83–108)

## 2023-05-22 LAB — CG4 I-STAT (LACTIC ACID): Lactic Acid, Venous: 3 mmol/L (ref 0.5–1.9)

## 2023-05-22 LAB — APTT
aPTT: >200 s (ref 24–36)
aPTT: >200 s (ref 24–36)
aPTT: >200 s (ref 24–36)
aPTT: 127 s — ABNORMAL HIGH (ref 24–36)
aPTT: 91 s — ABNORMAL HIGH (ref 24–36)

## 2023-05-22 LAB — HEPARIN LEVEL (UNFRACTIONATED): Heparin Unfractionated: >1.1 [IU]/mL — ABNORMAL HIGH (ref 0.30–0.70)

## 2023-05-22 MED ORDER — HEPARIN (PORCINE) 25000 UT/250ML-% IV SOLN
1200.0000 [IU]/h | INTRAVENOUS | Status: DC
  Administered 2023-05-24: 1000 [IU]/h via INTRAVENOUS
  Administered 2023-05-24: 900 [IU]/h via INTRAVENOUS
  Administered 2023-05-25 – 2023-05-28 (×3): 1000 [IU]/h via INTRAVENOUS
  Filled 2023-05-22 (×7): qty 250

## 2023-05-22 MED ORDER — LEVETIRACETAM 250 MG PO TABS
250.0000 mg | ORAL_TABLET | Freq: Two times a day (BID) | ORAL | Status: DC
  Administered 2023-05-22 – 2023-05-24 (×4): 250 mg
  Filled 2023-05-22 (×4): qty 1

## 2023-05-22 MED ORDER — OSELTAMIVIR PHOSPHATE 30 MG PO CAPS
30.0000 mg | ORAL_CAPSULE | Freq: Every day | ORAL | Status: AC
  Administered 2023-05-23 – 2023-05-24 (×2): 30 mg
  Filled 2023-05-22 (×2): qty 1

## 2023-05-22 MED ORDER — LACTATED RINGERS IV BOLUS
500.0000 mL | Freq: Once | INTRAVENOUS | Status: AC
  Administered 2023-05-22: 500 mL via INTRAVENOUS

## 2023-05-22 MED ORDER — INSULIN ASPART 100 UNIT/ML IJ SOLN
0.0000 [IU] | INTRAMUSCULAR | Status: DC
  Administered 2023-05-22 – 2023-05-23 (×5): 3 [IU] via SUBCUTANEOUS
  Administered 2023-05-23 (×2): 2 [IU] via SUBCUTANEOUS
  Administered 2023-05-23: 3 [IU] via SUBCUTANEOUS
  Administered 2023-05-23 – 2023-05-25 (×9): 2 [IU] via SUBCUTANEOUS
  Administered 2023-05-26 (×2): 3 [IU] via SUBCUTANEOUS
  Administered 2023-05-26 (×2): 2 [IU] via SUBCUTANEOUS
  Administered 2023-05-27: 5 [IU] via SUBCUTANEOUS
  Administered 2023-05-27: 3 [IU] via SUBCUTANEOUS
  Administered 2023-05-27: 5 [IU] via SUBCUTANEOUS
  Administered 2023-05-27: 8 [IU] via SUBCUTANEOUS
  Administered 2023-05-27: 5 [IU] via SUBCUTANEOUS
  Administered 2023-05-28: 8 [IU] via SUBCUTANEOUS
  Administered 2023-05-28: 5 [IU] via SUBCUTANEOUS
  Administered 2023-05-28: 9 [IU] via SUBCUTANEOUS

## 2023-05-22 MED ORDER — HEPARIN (PORCINE) 25000 UT/250ML-% IV SOLN
700.0000 [IU]/h | INTRAVENOUS | Status: DC
  Administered 2023-05-22 (×2): 700 [IU]/h via INTRAVENOUS

## 2023-05-22 MED ORDER — SODIUM BICARBONATE 8.4 % IV SOLN
100.0000 meq | Freq: Once | INTRAVENOUS | Status: AC
Start: 2023-05-22 — End: 2023-05-22
  Administered 2023-05-22: 100 meq via INTRAVENOUS
  Filled 2023-05-22: qty 100

## 2023-05-22 MED ORDER — MAGNESIUM SULFATE 2 GM/50ML IV SOLN
2.0000 g | Freq: Once | INTRAVENOUS | Status: AC
  Administered 2023-05-22: 2 g via INTRAVENOUS
  Filled 2023-05-22: qty 50

## 2023-05-22 NOTE — Progress Notes (Signed)
 NAME:  [name], MRN:  [id number], DOB:  May 24, 1945, LOS: 1 ADMISSION DATE:  05/20/2023, CONSULTATION DATE:  05/20/23 REFERRING MD:  Synetta Fail, MD , CHIEF COMPLAINT:  N/V/Abd pain / Resp failure   History of Present Illness:  HPI: [name] is a 78 y.o. female with medical history significant of hypertension, hyperlipidemia, diabetes, neuropathy, hypothyroidism, CKD 3B, CAD, GERD, CVA, chronic systolic CHF, depression, anxiety, ventricular tachycardia, ventricular fibrillation, atrial fibrillation, status post ICD, obesity, seizure disorder, low back pain, spinal stenosis presenting after feeling unwell for several days and recent ICD discharges.   Patient reports that she has been unwell for possible days.  She is experience nausea vomiting and abdominal pain.  She reports that her defibrillator fired multiple times today.  ED provider confirmed this with the [facility] representative who noted that the device did fire 3 times a day for ventricular fibrillation.  Also noted periods of slow ventricular tachycardia as well as atrial fibrillation.   Patient denies fevers, chills.   ED Course: Vital signs in the ED notable for blood pressure initially in the 70 systolic now improved to the 120s.  Respiratory rate in the 20s.  Requiring 3 L to maintain saturations.  Lab workup included CMP with potassium 3.2, bicarb 18, gap 17, BUN 32, creatinine elevated 2.37 baseline of 2, glucose 2 1, calcium 7.9, protein 6.0, albumin 2.5, T. bili 1.9, AST 1076, ALT 663.  CBC with platelets 142.  BNP elevated to 2548.  Troponin trend 339, 417.  Lipase normal.  Respiratory panel positive for flu A.  Lactic acid pending.  Urinalysis pending.  Blood cultures pending.  VBG with normal pH pCO2 40.7.  Magnesium pending.  Chest x-ray with new bilateral opacities consistent with pneumonia.  CT of the abdomen pelvis showing no acute normality in the abdomen pelvis, bilateral opacity at the lung bases,  left renal cysts.  Patient received ceftriaxone, doxycycline, Tamiflu in the ED.  Started on amiodarone infusion.  Also received morphine, 1 L IV fluids and 40 mEq p.o. potassium x 2.  Cardiology consulted and recommended amiodarone fusion, repletion of magnesium and potassium as needed, adding back beta-blocker as blood pressure allows  Pertinent  Medical History   has a past medical history of Acute kidney injury superimposed on chronic kidney disease (HCC) (10/02/2022), Allergy, Arthritis, Atrial fibrillation (HCC), Chronic renal insufficiency, Chronic systolic CHF (congestive heart failure) (HCC) (03/20/2013), Chronic systolic heart failure (HCC), Coronary artery disease (01/31/2011), Diabetes mellitus, 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, History of chicken pox, Hyperlipidemia, Hypertension, Ischemic cardiomyopathy, Knee MCL sprain (03/28/2014), Left knee pain (08/30/2012), Lumbar spondylosis (01/11/2012), Pes anserine bursitis (02/23/2014), Sleep apnea, Stroke (HCC) (2010), Suicide attempt by benzodiazepine overdose (HCC) (01/21/2020), UTI (urinary tract infection) (08/03/2013), and Ventricular tachycardia (HCC).   Significant Hospital Events: Including procedures, antibiotic start and stop dates in addition to other pertinent events   2/28 Remains on high-dose vasopressor support with Levophed. No more episodes of V. Tach. Remain acidotic mixed metabolic and respiratory 3/1 remains on ventilator with FiO2 50 PEEP 8, remains on both levo and vaso  Interim History / Subjective:  Sedated on ventilator  Objective   Blood pressure 110/73, pulse 69, temperature 98.8 F (37.1 C), temperature source Bladder, resp. rate (!) 28, height 5\' 2"  (1.575 m), weight 86.7 kg, SpO2 98%.    Vent Mode: PRVC FiO2 (%):  [50 %-100 %] 50 % Set Rate:  [25 bmp-28 bmp] 28 bmp Vt Set:  [400 mL]  400 mL PEEP:  [8 cmH20] 8 cmH20 Plateau Pressure:  [14 cmH20-23 cmH20] 14 cmH20   Intake/Output Summary (Last  24 hours) at 05/22/2023 0847 Last data filed at 05/22/2023 0800 Gross per 24 hour  Intake 3751.6 ml  Output 1105 ml  Net 2646.6 ml   Filed Weights   05/20/23 1200 05/21/23 0350 05/22/23 0500  Weight: 82.6 kg 84.5 kg 86.7 kg    Examination: General: Acute on chronic ill-appearing deconditioned elderly female lying in bed on mechanical ventilation in no acute distress HEENT: ETT, MM pink/moist, PERRL,  Neuro: Lightly sedated on ventilator, following intermittent simple commands CV: s1s2 regular rate and rhythm, no murmur, rubs, or gallops,  PULM: Bilateral rhonchi, tolerating ventilator, no increased work of breathing GI: soft, bowel sounds active in all 4 quadrants, non-tender, non-distended, tolerating TF Extremities: warm/dry, no edema  Skin: no rashes or lesions  Resolved Hospital Problem list     Assessment & Plan:  Acute respiratory failure with hypoxia and hypercapnia Influenza pneumonia Probable bilateral superimposed bacterial pneumonia P: Continue ventilator support with lung protective strategies  Wean PEEP and FiO2 for sats greater than 90%. Head of bed elevated 30 degrees. Plateau pressures less than 30 cm H20.  Follow intermittent chest x-ray and ABG.   SAT/SBT as tolerated, mentation preclude extubation  Ensure adequate pulmonary hygiene  Follow cultures  VAP bundle in place  PAD protocol Continue ceftriaxone and doxycycline  Severe sepsis with septic shock, POA P: Vent support as above Continue IV ceftriaxone and doxycycline Follow cultures Pressors for MAP goal greater than 65 Trend lactic acid Monitor urine output  Acute septic encephalopathy -improving P: Maintain neuro protective measures Nutrition and bowel regiment  Aspirations precautions  Minimize sedation as able  Ventricular tachycardia P: Cardiology following, appreciate assistance continuous telemetry Continue IV amiodarone Continuous telemetry  Chronic biventricular systolic and  diastolic heart failure -Echocardiogram October 2024 EF 35 to 40% with global hypokinesis and grade 1 diastolic dysfunction -Home medications include carvedilol, Bentyl, Lasix, amiodarone, Crestor and Xarelto P: Continuous telemetry  Consider obtaining repeat echocardiogram Continue aspirin Strict intake and output  Daily weight to assess volume status Daily assessment for need to diurese Closely monitor renal function and electrolytes  GDMT as able  Paroxysmal A-fib/A-flutter -Medication includes amiodarone and Xarelto P: Continue amiodarone drip as above Continue heparin Continuous telemetry  AKI on CKD stage IIIb -slightly improved -Creatinine on admission 2.60 with GFR 21 compared to creatinine 3.43 with GFR 13 2/28 Mixed respiratory and metabolic acidosis P: Monitor urine output Trend CMP Avoid nephrotoxins Ensure adequate renal perfusion Follow renal function / urine output  Shock liver P: Avoid hepatotoxins Intermittently trend LFTs  Diabetes type 2, with hyperglycemia -Home medications include insulin  P: Continue SSI Continue long-acting insulin  Best Practice (right click and "Reselect all SmartList Selections" daily)   Diet/type: NPO DVT prophylaxis IV heparin infusion Pressure ulcer(s): N/A GI prophylaxis: H2B Lines: right internal jugular CVL - 05/20/23 Foley: Yes needed Code Status:  full code Last date of multidisciplinary goals of care discussion: Pending  CRITICAL CARE Performed by: Caide Campi D. Harris   Total critical care time: 40 minutes  Critical care time was exclusive of separately billable procedures and treating other patients.  Critical care was necessary to treat or prevent imminent or life-threatening deterioration.  Critical care was time spent personally by me on the following activities: development of treatment plan with patient and/or surrogate as well as nursing, discussions with consultants, evaluation of patient's response  to treatment, examination of patient, obtaining history from patient or surrogate, ordering and performing treatments and interventions, ordering and review of laboratory studies, ordering and review of radiographic studies, pulse oximetry and re-evaluation of patient's condition.  Lucah Petta D. Harris, NP-C [facility] Personal contact information can be found on Amion  If no contact or response made please call [phone] 05/22/2023, 8:50 AM

## 2023-05-22 NOTE — Progress Notes (Signed)
 PHARMACY - ANTICOAGULATION CONSULT NOTE  Pharmacy Consult for heparin  Indication: atrial fibrillation  Allergies  Allergen Reactions   Veltassa [Patiromer] Diarrhea   Aldactone [Spironolactone] Diarrhea and Nausea And Vomiting   Darvon [Propoxyphene] Other (See Comments)    Indigestion    Mexitil [Mexiletine] Nausea And Vomiting and Other (See Comments)    Tremors Difficulty walking Blurred vision   Phenergan [Promethazine Hcl] Other (See Comments)    Hyperactivity    Ranexa [Ranolazine Er] Diarrhea   Plavix [Clopidogrel Bisulfate] Rash    Patient Measurements: Height: 5\' 2"  (157.5 cm) Weight: 86.7 kg (191 lb 2.2 oz) IBW/kg (Calculated) : 50.1 HEPARIN DW (KG): 68.6   Vital Signs: Temp: 97.7 F (36.5 C) (03/01 1600) Temp Source: Bladder (03/01 1600) BP: 128/73 (03/01 1800) Pulse Rate: 69 (03/01 1815)  Labs: Recent Labs    05/20/23 1223 05/20/23 1259 05/20/23 1340 05/20/23 1440 05/20/23 2208 05/21/23 0346 05/21/23 0941 05/21/23 1517 05/21/23 2308 05/22/23 0326 05/22/23 0407 05/22/23 0748 05/22/23 1018 05/22/23 1209 05/22/23 1449 05/22/23 1549  HGB 13.0  --    < >  --    < > 11.4*   < > 12.6  --   --  11.0* 11.6*  --   --   --   --   HCT 40.3  --    < >  --    < > 37.4   < > 37.0  --   --  32.9* 34.0*  --   --   --   --   PLT 142*  --   --   --   --  146*  --   --   --   --  127*  --   --   --   --   --   APTT  --   --   --   --   --   --   --   --  >200*  --   --   --    < > >200* 127* 91*  HEPARINUNFRC  --   --   --   --   --   --   --   --  >1.10*  --   --   --   --   --   --   --   CREATININE  --   --    < > 2.37*  --  3.43*  --   --   --  3.28*  --   --   --   --   --   --   TROPONINIHS  --  339*  --  417*  --   --   --   --   --   --   --   --   --   --   --   --    < > = values in this interval not displayed.    Estimated Creatinine Clearance: 14.9 mL/min (A) (by C-G formula based on SCr of 3.28 mg/dL (H)).   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    History of chicken pox    Hyperlipidemia    Hypertension    Ischemic cardiomyopathy    Knee MCL sprain 03/28/2014   Left knee pain 08/30/2012   Lumbar spondylosis 01/11/2012   Pes anserine bursitis 02/23/2014   Sleep apnea    Stroke [facility]) 2010   eye doctor said she had TIA   Suicide attempt by benzodiazepine overdose (HCC) 01/21/2020   UTI (urinary tract infection) 08/03/2013   Ventricular tachycardia (HCC)    Polymorphic    Medications:  Medications Prior to Admission  Medication Sig Dispense Refill Last Dose/Taking   amiodarone (PACERONE) 200 MG tablet Take 1 tablet (200 mg total) by mouth daily. 30 tablet 6 05/20/2023 Morning   aspirin EC 81 MG tablet Take 1 tablet (81 mg total) by mouth daily. Swallow whole. 30 tablet 12 05/20/2023 Morning   busPIRone (BUSPAR) 5 MG tablet Take 1 tablet (5 mg total) by mouth 2 (two) times daily. 60 tablet 1 05/20/2023 Morning   carvedilol (COREG) 25 MG tablet TAKE 1 TABLET (25 MG TOTAL) BY MOUTH TWICE A DAY WITH MEALS 180 tablet 3 05/20/2023 Morning   Cholecalciferol (VITAMIN D-3 PO) Take 1 tablet by mouth daily.   05/20/2023 Morning   dicyclomine (BENTYL) 10 MG capsule TAKE 1 CAPSULE (10 MG TOTAL) BY MOUTH 4 (FOUR) TIMES DAILY BEFORE MEALS AND AT BEDTIME. 120 capsule 1 05/20/2023 Morning   furosemide (LASIX) 20 MG tablet Take 2 tablets (40 mg) by mouth once a day. 60 tablet 11 05/20/2023 Morning   gabapentin (NEURONTIN) 100 MG capsule Take 200 mg by mouth 2 (two) times daily.   05/20/2023 Morning    insulin aspart (NOVOLOG FLEXPEN) 100 UNIT/ML FlexPen Inject up to 15 units daily under skin as advised (Patient taking differently: Inject 4-5 Units into the skin 3 (three) times daily before meals. Inject up to 15 units daily under skin as advised) 15 mL 11 Past Week   insulin degludec (TRESIBA FLEXTOUCH) 100 UNIT/ML FlexTouch Pen Inject 12-14 Units into the skin daily. (Patient taking differently: Inject 14 Units into the skin at bedtime.) 9 mL 3 05/19/2023 Bedtime   levETIRAcetam (KEPPRA XR) 500 MG 24 hr tablet Take 1 tablet (500 mg total) by mouth at bedtime. 90 tablet 3 05/19/2023 Bedtime   losartan (COZAAR) 25 MG tablet Take 25 mg by mouth daily.   05/20/2023 Morning   Multiple Vitamins-Minerals (EYE VITAMINS PO) Take 1 tablet by mouth 2 (two) times daily.   05/20/2023 Morning   nitroGLYCERIN (NITROSTAT) 0.4 MG SL tablet Place 1 tablet (0.4 mg total) under the tongue every 5 (five) minutes as needed for chest pain. 25 tablet 3 Past Month   omeprazole (PRILOSEC) 20 MG capsule Take 1 capsule by mouth daily.   05/20/2023 Morning   Rivaroxaban (XARELTO) 15 MG TABS tablet Take 1 tablet (15 mg total) by mouth daily with supper. (Patient taking differently: Take 15 mg by mouth at bedtime.) 30 tablet 6 05/19/2023 Bedtime   rosuvastatin (CRESTOR) 40 MG tablet Take 1 tablet (40 mg total) by mouth daily. NEEDS FOLLOW UP APPOINTMENT FOR MORE REFILLS (Patient taking differently: Take 40 mg by mouth at bedtime.) 90 tablet 0 05/19/2023 Bedtime   sertraline (ZOLOFT) 50 MG tablet Take 50 mg by mouth at bedtime.   05/19/2023 Bedtime   Continuous Glucose Sensor (FREESTYLE LIBRE 3 SENSOR) MISC 1 each by Does not apply route every 14 (fourteen) days. 6 each 3  magnesium oxide (MAG-OX) 400 (240 Mg) MG tablet TAKE 1 TABLET BY MOUTH TWICE A DAY (Patient not taking: Reported on 05/20/2023) 180 tablet 1 Not Taking   Scheduled:   aspirin  81 mg Per Tube Daily   busPIRone  5 mg Per Tube BID   Chlorhexidine Gluconate Cloth  6  each Topical Daily   gabapentin  200 mg Per Tube Q12H   hydrocortisone sod succinate (SOLU-CORTEF) inj  100 mg Intravenous Q12H   insulin aspart  0-15 Units Subcutaneous Q4H   insulin glargine  10 Units Subcutaneous Daily   ipratropium-albuterol  3 mL Nebulization Q6H   levETIRAcetam  250 mg Per Tube BID   mouth rinse  15 mL Mouth Rinse Q2H   [START ON 05/23/2023] oseltamivir  30 mg Per Tube Daily   sertraline  50 mg Per Tube QHS   sodium chloride flush  10-40 mL Intracatheter Q12H    Assessment: 78 yo female here with VT and ICD shock noted with, VDRF and AKI. 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 Pharmacy consulted to dose heparin.   aPTT>200 this morning after heparin held and rate reduced. Hgb 11.6, plt 127. No s/sx of bleeding or infusion issues. Heparin is running from PIV on left side and level drawn from A-line on same side - RN drew repeat level from central line which came back at >200. After holding heparin for 1.5 hours, collected another level and still elevated at 127.   PM f/u> aPTT now down to 91 off of IV heparin gtt.  No overt bleeding or complications noted.  Goal of Therapy:  Heparin level 0.3-0.7 units/ml aPTT 66-102 seconds Monitor platelets by anticoagulation protocol: Yes   Plan:  Resume IV heparin infusion at 750 units/hr. Check aPTT in 8 hrs. Daily heparin level, aPTT and CBC.  Thank you for allowing pharmacy to participate in this patient's care,  Jenetta Downer, [facility] Clinical Pharmacist  05/22/2023 6:32 PM   [facility] pharmacy phone numbers are listed on [email or web address]

## 2023-05-22 NOTE — Progress Notes (Addendum)
 PHARMACY - ANTICOAGULATION CONSULT NOTE  Pharmacy Consult for heparin  Indication: atrial fibrillation  Allergies  Allergen Reactions   Veltassa [Patiromer] Diarrhea   Aldactone [Spironolactone] Diarrhea and Nausea And Vomiting   Darvon [Propoxyphene] Other (See Comments)    Indigestion    Mexitil [Mexiletine] Nausea And Vomiting and Other (See Comments)    Tremors Difficulty walking Blurred vision   Phenergan [Promethazine Hcl] Other (See Comments)    Hyperactivity    Ranexa [Ranolazine Er] Diarrhea   Plavix [Clopidogrel Bisulfate] Rash    Patient Measurements: Height: 5\' 2"  (157.5 cm) Weight: 86.7 kg (191 lb 2.2 oz) IBW/kg (Calculated) : 50.1 HEPARIN DW (KG): 68.6   Vital Signs: Temp: 98.8 F (37.1 C) (03/01 0800) Temp Source: Bladder (03/01 0800) BP: 106/80 (03/01 1100) Pulse Rate: 70 (03/01 1143)  Labs: Recent Labs    05/20/23 1223 05/20/23 1259 05/20/23 1340 05/20/23 1440 05/20/23 2208 05/21/23 0346 05/21/23 0941 05/21/23 1517 05/21/23 2308 05/22/23 0326 05/22/23 0407 05/22/23 0748 05/22/23 1018  HGB 13.0  --    < >  --    < > 11.4*   < > 12.6  --   --  11.0* 11.6*  --   HCT 40.3  --    < >  --    < > 37.4   < > 37.0  --   --  32.9* 34.0*  --   PLT 142*  --   --   --   --  146*  --   --   --   --  127*  --   --   APTT  --   --   --   --   --   --   --   --  >200*  --   --   --  >200*  HEPARINUNFRC  --   --   --   --   --   --   --   --  >1.10*  --   --   --   --   CREATININE  --   --    < > 2.37*  --  3.43*  --   --   --  3.28*  --   --   --   TROPONINIHS  --  339*  --  417*  --   --   --   --   --   --   --   --   --    < > = values in this interval not displayed.    Estimated Creatinine Clearance: 14.9 mL/min (A) (by C-G formula based on SCr of 3.28 mg/dL (H)).   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    History of chicken pox    Hyperlipidemia    Hypertension    Ischemic cardiomyopathy    Knee MCL sprain 03/28/2014   Left knee pain 08/30/2012   Lumbar spondylosis 01/11/2012   Pes anserine bursitis 02/23/2014   Sleep apnea    Stroke (HCC) 2010  eye doctor said she had TIA   Suicide attempt by benzodiazepine overdose (HCC) 01/21/2020   UTI (urinary tract infection) 08/03/2013   Ventricular tachycardia (HCC)    Polymorphic    Medications:  Medications Prior to Admission  Medication Sig Dispense Refill Last Dose/Taking   amiodarone (PACERONE) 200 MG tablet Take 1 tablet (200 mg total) by mouth daily. 30 tablet 6 05/20/2023 Morning   aspirin EC 81 MG tablet Take 1 tablet (81 mg total) by mouth daily. Swallow whole. 30 tablet 12 05/20/2023 Morning   busPIRone (BUSPAR) 5 MG tablet Take 1 tablet (5 mg total) by mouth 2 (two) times daily. 60 tablet 1 05/20/2023 Morning   carvedilol (COREG) 25 MG tablet TAKE 1 TABLET (25 MG TOTAL) BY MOUTH TWICE A DAY WITH MEALS 180 tablet 3 05/20/2023 Morning   Cholecalciferol (VITAMIN D-3 PO) Take 1 tablet by mouth daily.   05/20/2023 Morning   dicyclomine (BENTYL) 10 MG capsule TAKE 1 CAPSULE (10 MG TOTAL) BY MOUTH 4 (FOUR) TIMES DAILY BEFORE MEALS AND AT BEDTIME. 120 capsule 1 05/20/2023 Morning   furosemide (LASIX) 20 MG tablet Take 2 tablets (40 mg) by mouth once a day. 60 tablet 11 05/20/2023 Morning   gabapentin (NEURONTIN) 100 MG capsule Take 200 mg by mouth 2 (two) times daily.   05/20/2023 Morning   insulin aspart (NOVOLOG FLEXPEN) 100 UNIT/ML FlexPen Inject up to 15 units daily under skin as advised (Patient taking differently: Inject 4-5 Units into  the skin 3 (three) times daily before meals. Inject up to 15 units daily under skin as advised) 15 mL 11 Past Week   insulin degludec (TRESIBA FLEXTOUCH) 100 UNIT/ML FlexTouch Pen Inject 12-14 Units into the skin daily. (Patient taking differently: Inject 14 Units into the skin at bedtime.) 9 mL 3 05/19/2023 Bedtime   levETIRAcetam (KEPPRA XR) 500 MG 24 hr tablet Take 1 tablet (500 mg total) by mouth at bedtime. 90 tablet 3 05/19/2023 Bedtime   losartan (COZAAR) 25 MG tablet Take 25 mg by mouth daily.   05/20/2023 Morning   Multiple Vitamins-Minerals (EYE VITAMINS PO) Take 1 tablet by mouth 2 (two) times daily.   05/20/2023 Morning   nitroGLYCERIN (NITROSTAT) 0.4 MG SL tablet Place 1 tablet (0.4 mg total) under the tongue every 5 (five) minutes as needed for chest pain. 25 tablet 3 Past Month   omeprazole (PRILOSEC) 20 MG capsule Take 1 capsule by mouth daily.   05/20/2023 Morning   Rivaroxaban (XARELTO) 15 MG TABS tablet Take 1 tablet (15 mg total) by mouth daily with supper. (Patient taking differently: Take 15 mg by mouth at bedtime.) 30 tablet 6 05/19/2023 Bedtime   rosuvastatin (CRESTOR) 40 MG tablet Take 1 tablet (40 mg total) by mouth daily. NEEDS FOLLOW UP APPOINTMENT FOR MORE REFILLS (Patient taking differently: Take 40 mg by mouth at bedtime.) 90 tablet 0 05/19/2023 Bedtime   sertraline (ZOLOFT) 50 MG tablet Take 50 mg by mouth at bedtime.   05/19/2023 Bedtime   Continuous Glucose Sensor (FREESTYLE LIBRE 3 SENSOR) MISC 1 each by Does not apply route every 14 (fourteen) days. 6 each 3    magnesium oxide (MAG-OX) 400 (240 Mg) MG tablet TAKE 1 TABLET BY MOUTH TWICE A DAY (Patient not taking: Reported on 05/20/2023) 180 tablet 1 Not Taking   Scheduled:   aspirin  81 mg Per Tube Daily   busPIRone  5 mg Per Tube BID   Chlorhexidine Gluconate Cloth  6 each Topical Daily   gabapentin  200  mg Per Tube Q12H   hydrocortisone sod succinate (SOLU-CORTEF) inj  100 mg Intravenous Q12H   insulin aspart  0-15  Units Subcutaneous Q4H   insulin glargine  10 Units Subcutaneous Daily   ipratropium-albuterol  3 mL Nebulization Q6H   levETIRAcetam  250 mg Oral BID   mouth rinse  15 mL Mouth Rinse Q2H   oseltamivir  30 mg Per Tube QODAY   sertraline  50 mg Per Tube QHS   sodium chloride flush  10-40 mL Intracatheter Q12H    Assessment: 78 yo female here with VT and ICD shock noted with, VDRF and AKI. 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 Pharmacy consulted to dose heparin.   aPTT>200 this morning after heparin held and rate reduced. Hgb 11.6, plt 127. No s/sx of bleeding or infusion issues. Heparin is running from PIV on left side and level drawn from A-line on same side - RN drew repeat level from central line which came back at >200. After holding heparin for 1.5 hours, collected another level and still elevated at 127.   Goal of Therapy:  Heparin level 0.3-0.7 units/ml aPTT 66-102 seconds Monitor platelets by anticoagulation protocol: Yes   Plan:  Continue to hold heparin >> recheck aPTT in 1 hour  F/u restart heparin infusion pending results   Thank you for allowing pharmacy to participate in this patient's care,  Sherron Monday, PharmD, BCCCP Clinical Pharmacist  Phone: [phone] 05/22/2023 12:01 PM  Please check AMION for all [facility] Pharmacy phone numbers After 10:00 PM, call Main Pharmacy [phone]

## 2023-05-22 NOTE — Plan of Care (Signed)
   Problem: Education: Goal: Ability to describe self-care measures that may prevent or decrease complications (Diabetes Survival Skills Education) will improve Outcome: Progressing Goal: Individualized Educational Video(s) Outcome: Progressing   Problem: Coping: Goal: Ability to adjust to condition or change in health will improve Outcome: Progressing   Problem: Fluid Volume: Goal: Ability to maintain a balanced intake and output will improve Outcome: Progressing   Problem: Health Behavior/Discharge Planning: Goal: Ability to identify and utilize available resources and services will improve Outcome: Progressing Goal: Ability to manage health-related needs will improve Outcome: Progressing   Problem: Metabolic: Goal: Ability to maintain appropriate glucose levels will improve Outcome: Progressing   Problem: Nutritional: Goal: Maintenance of adequate nutrition will improve Outcome: Progressing Goal: Progress toward achieving an optimal weight will improve Outcome: Progressing   Problem: Skin Integrity: Goal: Risk for impaired skin integrity will decrease Outcome: Progressing   Problem: Tissue Perfusion: Goal: Adequacy of tissue perfusion will improve Outcome: Progressing   Problem: Activity: Goal: Ability to tolerate increased activity will improve Outcome: Progressing   Problem: Respiratory: Goal: Ability to maintain a clear airway and adequate ventilation will improve Outcome: Progressing   Problem: Role Relationship: Goal: Method of communication will improve Outcome: Progressing   Problem: Education: Goal: Knowledge of General Education information will improve Description: Including pain rating scale, medication(s)/side effects and non-pharmacologic comfort measures Outcome: Progressing   Problem: Health Behavior/Discharge Planning: Goal: Ability to manage health-related needs will improve Outcome: Progressing   Problem: Clinical Measurements: Goal:  Ability to maintain clinical measurements within normal limits will improve Outcome: Progressing Goal: Will remain free from infection Outcome: Progressing Goal: Diagnostic test results will improve Outcome: Progressing Goal: Respiratory complications will improve Outcome: Progressing Goal: Cardiovascular complication will be avoided Outcome: Progressing   Problem: Activity: Goal: Risk for activity intolerance will decrease Outcome: Progressing   Problem: Nutrition: Goal: Adequate nutrition will be maintained Outcome: Progressing   Problem: Coping: Goal: Level of anxiety will decrease Outcome: Progressing   Problem: Elimination: Goal: Will not experience complications related to bowel motility Outcome: Progressing Goal: Will not experience complications related to urinary retention Outcome: Progressing   Problem: Pain Managment: Goal: General experience of comfort will improve and/or be controlled Outcome: Progressing   Problem: Safety: Goal: Ability to remain free from injury will improve Outcome: Progressing   Problem: Skin Integrity: Goal: Risk for impaired skin integrity will decrease Outcome: Progressing

## 2023-05-22 NOTE — Progress Notes (Signed)
 Pharmacist Heart Failure Core Measure Documentation  Assessment: [name] has an EF documented as 35-40% on ECHO by 12/2022.  Rationale: Heart failure patients with left ventricular systolic dysfunction (LVSD) and an EF < 40% should be prescribed an angiotensin converting enzyme inhibitor (ACEI) or angiotensin receptor blocker (ARB) at discharge unless a contraindication is documented in the medical record.  This patient is not currently on an ACEI or ARB for HF.  This note is being placed in the record in order to provide documentation that a contraindication to the use of these agents is present for this encounter.  ACE Inhibitor or Angiotensin Receptor Blocker is contraindicated (specify all that apply)  []   ACEI allergy AND ARB allergy []   Angioedema []   Moderate or severe aortic stenosis []   Hyperkalemia []   Hypotension []   Renal artery stenosis [x]   Worsening renal function, preexisting renal disease or dysfunction   Thank you for allowing pharmacy to participate in this patient's care,  Sherron Monday, PharmD, BCCCP Clinical Pharmacist  Phone: [phone] 05/22/2023 4:12 PM  Please check AMION for all [facility] Pharmacy phone numbers After 10:00 PM, call Main Pharmacy [phone]

## 2023-05-22 NOTE — Progress Notes (Addendum)
 Rounding Note    Patient Name: [name] Date of Encounter: 05/22/2023  [facility] Cardiologist: [name], MD   Subjective   Remains intubated.  Awake and responding.  Inpatient Medications    Scheduled Meds:  aspirin  81 mg Per Tube Daily   busPIRone  5 mg Per Tube BID   Chlorhexidine Gluconate Cloth  6 each Topical Daily   gabapentin  200 mg Per Tube Q12H   hydrocortisone sod succinate (SOLU-CORTEF) inj  100 mg Intravenous Q12H   insulin aspart  2-6 Units Subcutaneous Q4H   insulin glargine  10 Units Subcutaneous Daily   ipratropium-albuterol  3 mL Nebulization Q6H   levETIRAcetam  250 mg Oral BID   mouth rinse  15 mL Mouth Rinse Q2H   oseltamivir  30 mg Per Tube QODAY   sertraline  50 mg Per Tube QHS   sodium chloride flush  10-40 mL Intracatheter Q12H   Continuous Infusions:  amiodarone 30 mg/hr (05/22/23 0800)   cefTRIAXone (ROCEPHIN)  IV Stopped (05/21/23 1845)   doxycycline (VIBRAMYCIN) IV Stopped (05/21/23 2230)   famotidine (PEPCID) IV Stopped (05/21/23 2144)   fentaNYL infusion INTRAVENOUS 100 mcg/hr (05/22/23 0800)   heparin 700 Units/hr (05/22/23 0800)   lactated ringers Stopped (05/20/23 1457)   norepinephrine (LEVOPHED) Adult infusion 16 mcg/min (05/22/23 0800)   vasopressin 0.03 Units/min (05/22/23 0800)   PRN Meds: acetaminophen (TYLENOL) oral liquid 160 mg/5 mL, fentaNYL, ipratropium-albuterol, mouth rinse, polyethylene glycol, sodium chloride flush   Vital Signs    Vitals:   05/22/23 0630 05/22/23 0700 05/22/23 0800 05/22/23 0802  BP:  118/67  110/73  Pulse: 79 72 81 69  Resp: (!) 27 (!) 28 (!) 25 (!) 28  Temp:   98.8 F (37.1 C)   TempSrc:   Bladder   SpO2: 96% 98% 98% 98%  Weight:      Height:        Intake/Output Summary (Last 24 hours) at 05/22/2023 0839 Last data filed at 05/22/2023 0800 Gross per 24 hour  Intake 3751.6 ml  Output 1105 ml  Net 2646.6 ml      05/22/2023    5:00 AM 05/21/2023    3:50 AM  05/20/2023   12:00 PM  Last 3 Weights  Weight (lbs) 191 lb 2.2 oz 186 lb 4.6 oz 182 lb  Weight (kg) 86.7 kg 84.5 kg 82.555 kg      Telemetry    Atrial fibrillation- Personally Reviewed  ECG    None new- Personally Reviewed  Physical Exam   GEN: Intubated Neck: No JVD Cardiac: RRR, no murmurs, rubs, or gallops.  Respiratory: Ventilator breath sounds GI: Soft, nontender, non-distended  MS: No edema; No deformity. Neuro:  Nonfocal  Psych: Normal affect   Labs    High Sensitivity Troponin:   Recent Labs  Lab 05/20/23 1259 05/20/23 1440  TROPONINIHS 339* 417*     Chemistry Recent Labs  Lab 05/20/23 1440 05/20/23 2208 05/21/23 0346 05/21/23 0941 05/21/23 1517 05/22/23 0326 05/22/23 0748  NA 138   < > 137   < > 137 133* 136  K 3.2*   < > 4.5   < > 4.5 4.1 3.9  CL 103  --  105  --   --  99  --   CO2 18*  --  22  --   --  17*  --   GLUCOSE 201*  --  225*  --   --  211*  --  BUN 32*  --  39*  --   --  43*  --   CREATININE 2.37*  --  3.43*  --   --  3.28*  --   CALCIUM 7.9*  --  7.7*  --   --  7.0*  --   MG 1.6*  --  2.1  --   --  1.7  --   PROT 6.0*  --  5.7*  --   --  5.4*  --   ALBUMIN 2.5*  --  2.2*  --   --  1.9*  --   AST 1,076*  --  1,475*  --   --  1,151*  --   ALT 663*  --  777*  --   --  773*  --   ALKPHOS 59  --  51  --   --  56  --   BILITOT 1.9*  --  1.5*  --   --  2.1*  --   GFRNONAA 21*  --  13*  --   --  14*  --   ANIONGAP 17*  --  10  --   --  17*  --    < > = values in this interval not displayed.    Lipids No results for input(s): "CHOL", "TRIG", "HDL", "LABVLDL", "LDLCALC", "CHOLHDL" in the last 168 hours.  Hematology Recent Labs  Lab 05/20/23 1223 05/20/23 1340 05/21/23 0346 05/21/23 0941 05/21/23 1517 05/22/23 0407 05/22/23 0748  WBC 8.7  --  6.2  --   --  9.2  --   RBC 4.53  --  4.03  --   --  3.76*  --   HGB 13.0   < > 11.4*   < > 12.6 11.0* 11.6*  HCT 40.3   < > 37.4   < > 37.0 32.9* 34.0*  MCV 89.0  --  92.8  --   --  87.5   --   MCH 28.7  --  28.3  --   --  29.3  --   MCHC 32.3  --  30.5  --   --  33.4  --   RDW 16.9*  --  17.4*  --   --  17.8*  --   PLT 142*  --  146*  --   --  127*  --    < > = values in this interval not displayed.   Thyroid No results for input(s): "TSH", "FREET4" in the last 168 hours.  BNP Recent Labs  Lab 05/20/23 1223  BNP 2,548.1*    DDimer No results for input(s): "DDIMER" in the last 168 hours.   Radiology    Portable Chest xray Result Date: 05/21/2023 CLINICAL DATA:  Intubated EXAM: PORTABLE CHEST 1 VIEW COMPARISON:  Prior chest x-ray 05/20/2023 FINDINGS: The patient is intubated. The tip of the endotracheal tube is 2.4 cm above the carina. A gastric tube is partially imaged. The tip lies off the field of view, below the diaphragm. External defibrillator pads are in place. Left subclavian approach cardiac rhythm maintenance device in unchanged position with leads overlying the right atrium and right ventricle. Stable mild cardiomegaly. Patchy airspace opacity in the right upper lobe again noted. Additionally, patchy airspace opacities are visible in the left mid lung and left lower lobe. Findings suggest multifocal pneumonia. No pneumothorax. IMPRESSION: 1. Stable and satisfactory support apparatus. 2. Multifocal patchy airspace opacities in the right upper lobe, left lower lobe and left mid lung suggest multifocal pneumonia.  Electronically Signed   By: Malachy Moan M.D.   On: 05/21/2023 09:19   DG CHEST PORT 1 VIEW Result Date: 05/21/2023 CLINICAL DATA:  Central line placement EXAM: PORTABLE CHEST 1 VIEW COMPARISON:  Film from earlier in the same day. FINDINGS: Endotracheal tube and gastric catheter are again noted in satisfactory position. New right jugular central line is seen at the cavoatrial junction. No pneumothorax is noted. Worsening right upper lobe infiltrate is noted when compared with the prior exam. Mild left basilar airspace opacity is noted as well. Pacing device  is again seen. IMPRESSION: No pneumothorax following central line placement. Increasing airspace opacity in the right upper lobe. Electronically Signed   By: Alcide Clever M.D.   On: 05/21/2023 00:31   CT ABDOMEN PELVIS WO CONTRAST Result Date: 05/20/2023 CLINICAL DATA:  Acute abdominal pain. EXAM: CT ABDOMEN AND PELVIS WITHOUT CONTRAST TECHNIQUE: Multidetector CT imaging of the abdomen and pelvis was performed following the standard protocol without IV contrast. RADIATION DOSE REDUCTION: This exam was performed according to the departmental dose-optimization program which includes automated exposure control, adjustment of the mA and/or kV according to patient size and/or use of iterative reconstruction technique. COMPARISON:  CT abdomen and pelvis 08/01/2014. FINDINGS: Lower chest: There are mild patchy opacities in both lung bases. Hepatobiliary: No focal liver abnormality is seen. Status post cholecystectomy. No biliary dilatation. Pancreas: Unremarkable. No pancreatic ductal dilatation or surrounding inflammatory changes. Spleen: Normal in size without focal abnormality. Adrenals/Urinary Tract: Adrenal glands are unremarkable. There is no hydronephrosis or urinary tract calculus. There is an 18 mm cyst in the left kidney. Bladder is unremarkable. Stomach/Bowel: Stomach is within normal limits. Appendix is not seen. No evidence of bowel wall thickening, distention, or inflammatory changes. Vascular/Lymphatic: Aortic atherosclerosis. No enlarged abdominal or pelvic lymph nodes. Reproductive: Status post hysterectomy. No adnexal masses. Other: No abdominal wall hernia or abnormality. No abdominopelvic ascites. Musculoskeletal: Multilevel degenerative changes affect the spine. IMPRESSION: 1. No acute localizing process in the abdomen or pelvis. 2. Mild patchy opacities in both lung bases, possibly atelectasis or infection. 3. Left Bosniak I benign renal cyst measuring 1.8 cm. No follow-up imaging is recommended.  JACR 2018 Feb; 264-273, Management of the Incidental Renal Mass on CT, [employer] 2021; [phone], Bosniak Classification of Cystic Renal Masses, Version 2019. Aortic Atherosclerosis (ICD10-I70.0). Electronically Signed   By: Darliss Cheney M.D.   On: 05/20/2023 17:32   DG Chest Portable 1 View Result Date: 05/20/2023 CLINICAL DATA:  mild resp distress, tachypnea, hypoxia. EXAM: PORTABLE CHEST 1 VIEW COMPARISON:  12/07/2022. FINDINGS: There are new heterogeneous opacities overlying the right upper mid lung zones, concerning for pneumonia. Follow-up to clearing is recommended. Bilateral lung fields are otherwise clear. Bilateral lateral costophrenic angles are clear. Stable cardio-mediastinal silhouette. There is a left sided 2-lead pacemaker. No acute osseous abnormalities. The soft tissues are within normal limits. IMPRESSION: New heterogeneous opacities overlying the right upper and mid lung zones, concerning for pneumonia. Follow-up to clearing is recommended. Electronically Signed   By: Jules Schick M.D.   On: 05/20/2023 15:54    Cardiac Studies     Patient Profile     78 y.o. female with a history of VT, chronic systolic heart failure, coronary artery disease, atrial fibrillation presented to the hospital with ventricular tachycardia, found to be flu a positive  Assessment & Plan    1.  Ventricular tachycardia: Currently on IV amiodarone.  No further prolonged episodes of ventricular tachycardia since being in the ICU.  Continue amiodarone.  2.  Paroxysmal atrial fibrillation/flutter: Currently in atrial fibrillation.  Amiodarone as above.  Continue heparin for stroke prophylaxis  3.  Flu a pneumonia: Plan per primary team     For questions or updates, please contact [facility] Please consult [email or web address] for contact info under        Signed, Amanat Hackel [name], MD  05/22/2023, 8:39 AM

## 2023-05-22 NOTE — Progress Notes (Signed)
 PHARMACY - ANTICOAGULATION CONSULT NOTE  Pharmacy Consult for heparin Indication: atrial fibrillation  Labs: Recent Labs    05/20/23 1223 05/20/23 1259 05/20/23 1340 05/20/23 1440 05/20/23 2208 05/21/23 0346 05/21/23 0941 05/21/23 1517 05/21/23 2308  HGB 13.0  --  13.3  13.3  --    < > 11.4* 12.9 12.6  --   HCT 40.3  --  39.0  39.0  --    < > 37.4 38.0 37.0  --   PLT 142*  --   --   --   --  146*  --   --   --   APTT  --   --   --   --   --   --   --   --  >200*  HEPARINUNFRC  --   --   --   --   --   --   --   --  >1.10*  CREATININE  --   --  2.60* 2.37*  --  3.43*  --   --   --   TROPONINIHS  --  339*  --  417*  --   --   --   --   --    < > = values in this interval not displayed.   Assessment: 78yo female supratherapeutic on heparin with initial dosing while DOAC on hold; no infusion issues or signs of bleeding per RN; of note heparin infusion was inadvertently started at 9a instead of 10p as ordered.  Goal of Therapy:  aPTT 66-102 seconds   Plan:  Hold heparin infusion x19min then Decrease heparin infusion by 3 units/kg/hr to 700 units/hr. Check PTT in 8 hours.   Vernard Gambles, PharmD, BCPS 05/22/2023 12:57 AM

## 2023-05-22 NOTE — Plan of Care (Signed)
  Problem: Metabolic: Goal: Ability to maintain appropriate glucose levels will improve Outcome: Progressing   Problem: Skin Integrity: Goal: Risk for impaired skin integrity will decrease Outcome: Progressing   Problem: Tissue Perfusion: Goal: Adequacy of tissue perfusion will improve Outcome: Progressing   Problem: Respiratory: Goal: Ability to maintain a clear airway and adequate ventilation will improve Outcome: Progressing

## 2023-05-22 DEATH — deceased

## 2023-05-23 ENCOUNTER — Inpatient Hospital Stay (HOSPITAL_COMMUNITY)

## 2023-05-23 DIAGNOSIS — I517 Cardiomegaly: Secondary | ICD-10-CM | POA: Diagnosis not present

## 2023-05-23 DIAGNOSIS — R918 Other nonspecific abnormal finding of lung field: Secondary | ICD-10-CM | POA: Diagnosis not present

## 2023-05-23 DIAGNOSIS — J9602 Acute respiratory failure with hypercapnia: Secondary | ICD-10-CM | POA: Diagnosis not present

## 2023-05-23 DIAGNOSIS — I4901 Ventricular fibrillation: Secondary | ICD-10-CM | POA: Diagnosis not present

## 2023-05-23 DIAGNOSIS — Z4682 Encounter for fitting and adjustment of non-vascular catheter: Secondary | ICD-10-CM | POA: Diagnosis not present

## 2023-05-23 DIAGNOSIS — J09X2 Influenza due to identified novel influenza A virus with other respiratory manifestations: Secondary | ICD-10-CM | POA: Diagnosis not present

## 2023-05-23 DIAGNOSIS — N179 Acute kidney failure, unspecified: Secondary | ICD-10-CM | POA: Diagnosis not present

## 2023-05-23 DIAGNOSIS — N1832 Chronic kidney disease, stage 3b: Secondary | ICD-10-CM | POA: Diagnosis not present

## 2023-05-23 DIAGNOSIS — J9601 Acute respiratory failure with hypoxia: Secondary | ICD-10-CM | POA: Diagnosis not present

## 2023-05-23 LAB — CBC
HCT: 32 % — ABNORMAL LOW (ref 36.0–46.0)
Hemoglobin: 10.5 g/dL — ABNORMAL LOW (ref 12.0–15.0)
MCH: 28.2 pg (ref 26.0–34.0)
MCHC: 32.8 g/dL (ref 30.0–36.0)
MCV: 86 fL (ref 80.0–100.0)
Platelets: 144 10*3/uL — ABNORMAL LOW (ref 150–400)
RBC: 3.72 MIL/uL — ABNORMAL LOW (ref 3.87–5.11)
RDW: 18.3 % — ABNORMAL HIGH (ref 11.5–15.5)
WBC: 9 10*3/uL (ref 4.0–10.5)
nRBC: 0.3 % — ABNORMAL HIGH (ref 0.0–0.2)

## 2023-05-23 LAB — BASIC METABOLIC PANEL
Anion gap: 16 — ABNORMAL HIGH (ref 5–15)
BUN: 45 mg/dL — ABNORMAL HIGH (ref 8–23)
CO2: 23 mmol/L (ref 22–32)
Calcium: 7.6 mg/dL — ABNORMAL LOW (ref 8.9–10.3)
Chloride: 99 mmol/L (ref 98–111)
Creatinine, Ser: 2.8 mg/dL — ABNORMAL HIGH (ref 0.44–1.00)
GFR, Estimated: 17 mL/min — ABNORMAL LOW (ref >60)
Glucose, Bld: 168 mg/dL — ABNORMAL HIGH (ref 70–99)
Potassium: 3.2 mmol/L — ABNORMAL LOW (ref 3.5–5.1)
Sodium: 138 mmol/L (ref 135–145)

## 2023-05-23 LAB — RENAL FUNCTION PANEL
Albumin: 1.7 g/dL — ABNORMAL LOW (ref 3.5–5.0)
Anion gap: 13 (ref 5–15)
BUN: 54 mg/dL — ABNORMAL HIGH (ref 8–23)
CO2: 23 mmol/L (ref 22–32)
Calcium: 7.7 mg/dL — ABNORMAL LOW (ref 8.9–10.3)
Chloride: 103 mmol/L (ref 98–111)
Creatinine, Ser: 2.54 mg/dL — ABNORMAL HIGH (ref 0.44–1.00)
GFR, Estimated: 19 mL/min — ABNORMAL LOW (ref >60)
Glucose, Bld: 130 mg/dL — ABNORMAL HIGH (ref 70–99)
Phosphorus: 2.9 mg/dL (ref 2.5–4.6)
Potassium: 3.3 mmol/L — ABNORMAL LOW (ref 3.5–5.1)
Sodium: 139 mmol/L (ref 135–145)

## 2023-05-23 LAB — GLUCOSE, CAPILLARY
Glucose-Capillary: 113 mg/dL — ABNORMAL HIGH (ref 70–99)
Glucose-Capillary: 123 mg/dL — ABNORMAL HIGH (ref 70–99)
Glucose-Capillary: 133 mg/dL — ABNORMAL HIGH (ref 70–99)
Glucose-Capillary: 134 mg/dL — ABNORMAL HIGH (ref 70–99)
Glucose-Capillary: 153 mg/dL — ABNORMAL HIGH (ref 70–99)
Glucose-Capillary: 154 mg/dL — ABNORMAL HIGH (ref 70–99)
Glucose-Capillary: 165 mg/dL — ABNORMAL HIGH (ref 70–99)

## 2023-05-23 LAB — APTT
aPTT: 106 s — ABNORMAL HIGH (ref 24–36)
aPTT: 61 s — ABNORMAL HIGH (ref 24–36)
aPTT: 35 s (ref 24–36)

## 2023-05-23 LAB — HEPARIN LEVEL (UNFRACTIONATED): Heparin Unfractionated: 1 [IU]/mL — ABNORMAL HIGH (ref 0.30–0.70)

## 2023-05-23 LAB — POTASSIUM: Potassium: 2.9 mmol/L — ABNORMAL LOW (ref 3.5–5.1)

## 2023-05-23 MED ORDER — POTASSIUM CHLORIDE 20 MEQ PO PACK
40.0000 meq | PACK | Freq: Once | ORAL | Status: AC
Start: 2023-05-23 — End: 2023-05-23
  Administered 2023-05-23: 40 meq
  Filled 2023-05-23: qty 2

## 2023-05-23 MED ORDER — POLYETHYLENE GLYCOL 3350 17 G PO PACK
17.0000 g | PACK | Freq: Every day | ORAL | Status: DC
  Administered 2023-05-23 – 2023-05-24 (×2): 17 g
  Filled 2023-05-23 (×2): qty 1

## 2023-05-23 MED ORDER — SENNOSIDES 8.8 MG/5ML PO SYRP
5.0000 mL | ORAL_SOLUTION | Freq: Two times a day (BID) | ORAL | Status: DC
  Administered 2023-05-23 – 2023-05-24 (×3): 5 mL
  Filled 2023-05-23 (×3): qty 5

## 2023-05-23 MED ORDER — POTASSIUM CHLORIDE 20 MEQ PO PACK
40.0000 meq | PACK | ORAL | Status: AC
  Administered 2023-05-23 – 2023-05-24 (×2): 40 meq
  Filled 2023-05-23 (×2): qty 2

## 2023-05-23 MED ORDER — LACTATED RINGERS IV BOLUS
1000.0000 mL | Freq: Once | INTRAVENOUS | Status: AC
Start: 2023-05-23 — End: 2023-05-24
  Administered 2023-05-23: 1000 mL via INTRAVENOUS

## 2023-05-23 MED ORDER — POTASSIUM CHLORIDE 10 MEQ/50ML IV SOLN
10.0000 meq | INTRAVENOUS | Status: AC
  Administered 2023-05-23 (×2): 10 meq via INTRAVENOUS
  Filled 2023-05-23 (×2): qty 50

## 2023-05-23 MED ORDER — POTASSIUM CHLORIDE 20 MEQ PO PACK
40.0000 meq | PACK | Freq: Once | ORAL | Status: AC
  Administered 2023-05-23: 40 meq
  Filled 2023-05-23: qty 2

## 2023-05-23 MED ORDER — METHYLPREDNISOLONE SODIUM SUCC 40 MG IJ SOLR
40.0000 mg | Freq: Two times a day (BID) | INTRAMUSCULAR | Status: DC
  Administered 2023-05-23 – 2023-05-26 (×7): 40 mg via INTRAVENOUS
  Filled 2023-05-23 (×7): qty 1

## 2023-05-23 NOTE — Plan of Care (Signed)
   Problem: Education: Goal: Ability to describe self-care measures that may prevent or decrease complications (Diabetes Survival Skills Education) will improve Outcome: Progressing Goal: Individualized Educational Video(s) Outcome: Progressing   Problem: Coping: Goal: Ability to adjust to condition or change in health will improve Outcome: Progressing   Problem: Fluid Volume: Goal: Ability to maintain a balanced intake and output will improve Outcome: Progressing   Problem: Health Behavior/Discharge Planning: Goal: Ability to identify and utilize available resources and services will improve Outcome: Progressing Goal: Ability to manage health-related needs will improve Outcome: Progressing   Problem: Metabolic: Goal: Ability to maintain appropriate glucose levels will improve Outcome: Progressing   Problem: Nutritional: Goal: Maintenance of adequate nutrition will improve Outcome: Progressing Goal: Progress toward achieving an optimal weight will improve Outcome: Progressing   Problem: Skin Integrity: Goal: Risk for impaired skin integrity will decrease Outcome: Progressing   Problem: Tissue Perfusion: Goal: Adequacy of tissue perfusion will improve Outcome: Progressing   Problem: Activity: Goal: Ability to tolerate increased activity will improve Outcome: Progressing   Problem: Respiratory: Goal: Ability to maintain a clear airway and adequate ventilation will improve Outcome: Progressing   Problem: Role Relationship: Goal: Method of communication will improve Outcome: Progressing   Problem: Education: Goal: Knowledge of General Education information will improve Description: Including pain rating scale, medication(s)/side effects and non-pharmacologic comfort measures Outcome: Progressing   Problem: Health Behavior/Discharge Planning: Goal: Ability to manage health-related needs will improve Outcome: Progressing   Problem: Clinical Measurements: Goal:  Ability to maintain clinical measurements within normal limits will improve Outcome: Progressing Goal: Will remain free from infection Outcome: Progressing Goal: Diagnostic test results will improve Outcome: Progressing Goal: Respiratory complications will improve Outcome: Progressing Goal: Cardiovascular complication will be avoided Outcome: Progressing   Problem: Activity: Goal: Risk for activity intolerance will decrease Outcome: Progressing   Problem: Nutrition: Goal: Adequate nutrition will be maintained Outcome: Progressing   Problem: Coping: Goal: Level of anxiety will decrease Outcome: Progressing   Problem: Elimination: Goal: Will not experience complications related to bowel motility Outcome: Progressing Goal: Will not experience complications related to urinary retention Outcome: Progressing   Problem: Pain Managment: Goal: General experience of comfort will improve and/or be controlled Outcome: Progressing   Problem: Safety: Goal: Ability to remain free from injury will improve Outcome: Progressing   Problem: Skin Integrity: Goal: Risk for impaired skin integrity will decrease Outcome: Progressing

## 2023-05-23 NOTE — Progress Notes (Signed)
 PHARMACY - ANTICOAGULATION CONSULT NOTE  Pharmacy Consult for heparin  Indication: atrial fibrillation  Allergies  Allergen Reactions   Veltassa [Patiromer] Diarrhea   Aldactone [Spironolactone] Diarrhea and Nausea And Vomiting   Darvon [Propoxyphene] Other (See Comments)    Indigestion    Mexitil [Mexiletine] Nausea And Vomiting and Other (See Comments)    Tremors Difficulty walking Blurred vision   Phenergan [Promethazine Hcl] Other (See Comments)    Hyperactivity    Ranexa [Ranolazine Er] Diarrhea   Plavix [Clopidogrel Bisulfate] Rash    Patient Measurements: Height: 5\' 2"  (157.5 cm) Weight: 87.7 kg (193 lb 5.5 oz) IBW/kg (Calculated) : 50.1 HEPARIN DW (KG): 68.6   Vital Signs: Temp: 98.1 F (36.7 C) (03/02 2000) Temp Source: Bladder (03/02 2000) BP: 112/78 (03/02 2000) Pulse Rate: 43 (03/02 2000)  Labs: Recent Labs    05/21/23 0346 05/21/23 0941 05/21/23 2308 05/22/23 0326 05/22/23 0407 05/22/23 0748 05/22/23 1018 05/23/23 0207 05/23/23 1118 05/23/23 2121  HGB 11.4*   < >  --   --  11.0* 11.6*  --  10.5*  --   --   HCT 37.4   < >  --   --  32.9* 34.0*  --  32.0*  --   --   PLT 146*  --   --   --  127*  --   --  144*  --   --   APTT  --   --  >200*  --   --   --    < > 106* 61* 35  HEPARINUNFRC  --   --  >1.10*  --   --   --   --  1.00*  --   --   CREATININE 3.43*  --   --  3.28*  --   --   --  2.80*  --  2.54*   < > = values in this interval not displayed.    Estimated Creatinine Clearance: 19.4 mL/min (A) (by C-G formula based on SCr of 2.54 mg/dL (H)).   Medical History: Past Medical History:  Diagnosis Date   Acute kidney injury superimposed on chronic kidney disease (HCC) 10/02/2022   Allergy    Arthritis    Atrial fibrillation (HCC)    controlled with amiodarone, on coumadin   Chronic renal insufficiency    Chronic systolic CHF (congestive heart failure) (HCC) 03/20/2013   Chronic systolic heart failure (HCC)    Coronary artery disease  01/31/2011   Diabetes mellitus    type 1   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    History of chicken pox    Hyperlipidemia    Hypertension    Ischemic cardiomyopathy    Knee MCL sprain 03/28/2014   Left knee pain 08/30/2012   Lumbar spondylosis 01/11/2012   Pes anserine bursitis 02/23/2014   Sleep apnea    Stroke (HCC) 2010   eye doctor said she had TIA   Suicide attempt by benzodiazepine overdose (HCC) 01/21/2020   UTI (urinary tract infection) 08/03/2013   Ventricular tachycardia (HCC)    Polymorphic    Medications:  Medications Prior to Admission  Medication Sig Dispense Refill Last Dose/Taking   amiodarone (PACERONE) 200 MG tablet Take 1 tablet (200 mg total) by mouth daily. 30 tablet 6 05/20/2023 Morning   aspirin EC 81 MG tablet Take 1 tablet (81 mg total) by mouth daily. Swallow whole. 30 tablet 12 05/20/2023 Morning   busPIRone (BUSPAR) 5 MG tablet Take 1 tablet (5 mg total)  by mouth 2 (two) times daily. 60 tablet 1 05/20/2023 Morning   carvedilol (COREG) 25 MG tablet TAKE 1 TABLET (25 MG TOTAL) BY MOUTH TWICE A DAY WITH MEALS 180 tablet 3 05/20/2023 Morning   Cholecalciferol (VITAMIN D-3 PO) Take 1 tablet by mouth daily.   05/20/2023 Morning   dicyclomine (BENTYL) 10 MG capsule TAKE 1 CAPSULE (10 MG TOTAL) BY MOUTH 4 (FOUR) TIMES DAILY BEFORE MEALS AND AT BEDTIME. 120 capsule 1 05/20/2023 Morning   furosemide (LASIX) 20 MG tablet Take 2 tablets (40 mg) by mouth once a day. 60 tablet 11 05/20/2023 Morning   gabapentin (NEURONTIN) 100 MG capsule Take 200 mg by mouth 2 (two) times daily.   05/20/2023 Morning   insulin aspart (NOVOLOG FLEXPEN) 100 UNIT/ML FlexPen Inject up to 15 units daily under skin as advised (Patient taking differently: Inject 4-5 Units into the skin 3 (three) times daily before meals. Inject up to 15 units daily under skin as advised) 15 mL 11 Past Week   insulin degludec (TRESIBA FLEXTOUCH) 100 UNIT/ML FlexTouch Pen Inject 12-14 Units into the skin  daily. (Patient taking differently: Inject 14 Units into the skin at bedtime.) 9 mL 3 05/19/2023 Bedtime   levETIRAcetam (KEPPRA XR) 500 MG 24 hr tablet Take 1 tablet (500 mg total) by mouth at bedtime. 90 tablet 3 05/19/2023 Bedtime   losartan (COZAAR) 25 MG tablet Take 25 mg by mouth daily.   05/20/2023 Morning   Multiple Vitamins-Minerals (EYE VITAMINS PO) Take 1 tablet by mouth 2 (two) times daily.   05/20/2023 Morning   nitroGLYCERIN (NITROSTAT) 0.4 MG SL tablet Place 1 tablet (0.4 mg total) under the tongue every 5 (five) minutes as needed for chest pain. 25 tablet 3 Past Month   omeprazole (PRILOSEC) 20 MG capsule Take 1 capsule by mouth daily.   05/20/2023 Morning   Rivaroxaban (XARELTO) 15 MG TABS tablet Take 1 tablet (15 mg total) by mouth daily with supper. (Patient taking differently: Take 15 mg by mouth at bedtime.) 30 tablet 6 05/19/2023 Bedtime   rosuvastatin (CRESTOR) 40 MG tablet Take 1 tablet (40 mg total) by mouth daily. NEEDS FOLLOW UP APPOINTMENT FOR MORE REFILLS (Patient taking differently: Take 40 mg by mouth at bedtime.) 90 tablet 0 05/19/2023 Bedtime   sertraline (ZOLOFT) 50 MG tablet Take 50 mg by mouth at bedtime.   05/19/2023 Bedtime   Continuous Glucose Sensor (FREESTYLE LIBRE 3 SENSOR) MISC 1 each by Does not apply route every 14 (fourteen) days. 6 each 3    magnesium oxide (MAG-OX) 400 (240 Mg) MG tablet TAKE 1 TABLET BY MOUTH TWICE A DAY (Patient not taking: Reported on 05/20/2023) 180 tablet 1 Not Taking   Scheduled:   aspirin  81 mg Per Tube Daily   busPIRone  5 mg Per Tube BID   Chlorhexidine Gluconate Cloth  6 each Topical Daily   gabapentin  200 mg Per Tube Q12H   insulin aspart  0-15 Units Subcutaneous Q4H   insulin glargine  10 Units Subcutaneous Daily   ipratropium-albuterol  3 mL Nebulization Q6H   levETIRAcetam  250 mg Per Tube BID   methylPREDNISolone (SOLU-MEDROL) injection  40 mg Intravenous Q12H   mouth rinse  15 mL Mouth Rinse Q2H   oseltamivir  30 mg Per  Tube Daily   polyethylene glycol  17 g Per Tube Daily   potassium chloride  40 mEq Per Tube Q4H   sennosides  5 mL Per Tube BID   sertraline  50 mg  Per Tube QHS    Assessment: 78 yo female here with VT and ICD shock noted with, VDRF and AKI. 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 Pharmacy consulted to dose heparin.   aPTT is subtherapeutic at 61, on 600 units/hr. Hgb 10.5, plt 144. No s/sx of bleeding or infusion issues. Running peripherally and drawn from A-line.  PM f/u  - aPTT this evening continuing to fall despite heparin rate increase.  No known issues with IV infusion.  APTT drawn from central line, running peripherally.  No overt bleeding or complications noted.  Goal of Therapy:  Heparin level 0.3-0.7 units/ml aPTT 66-102 seconds Monitor platelets by anticoagulation protocol: Yes   Plan:  Increase IV heparin infusion at 900 units/hr. Check aPTT in 8 hrs Daily heparin level, aPTT and CBC.  Thank you for allowing pharmacy to participate in this patient's care,  Jenetta Downer, [facility] Clinical Pharmacist  05/23/2023 10:19 PM   [facility] pharmacy phone numbers are listed on [email or web address]

## 2023-05-23 NOTE — Progress Notes (Signed)
 PHARMACY - ANTICOAGULATION CONSULT NOTE  Pharmacy Consult for heparin Indication: atrial fibrillation  Labs: Recent Labs    05/20/23 1259 05/20/23 1340 05/20/23 1440 05/20/23 2208 05/21/23 0346 05/21/23 0941 05/21/23 2308 05/22/23 0326 05/22/23 0407 05/22/23 0748 05/22/23 1018 05/22/23 1449 05/22/23 1549 05/23/23 0207  HGB  --    < >  --    < > 11.4*   < >  --   --  11.0* 11.6*  --   --   --  10.5*  HCT  --    < >  --    < > 37.4   < >  --   --  32.9* 34.0*  --   --   --  32.0*  PLT  --   --   --   --  146*  --   --   --  127*  --   --   --   --  144*  APTT  --   --   --   --   --   --  >200*  --   --   --    < > 127* 91* 106*  HEPARINUNFRC  --   --   --   --   --   --  >1.10*  --   --   --   --   --   --  1.00*  CREATININE  --    < > 2.37*  --  3.43*  --   --  3.28*  --   --   --   --   --  2.80*  TROPONINIHS 339*  --  417*  --   --   --   --   --   --   --   --   --   --   --    < > = values in this interval not displayed.   Assessment: 78yo female supratherapeutic on heparin after rate change but closer to goal; no infusion issues or signs of bleeding per RN though noted that Hgb is trending down.  Goal of Therapy:  aPTT 66-102 seconds   Plan:  Decrease heparin infusion by 2 units/kg/hr to 600 units/hr. Check PTT in 8 hours.   Vernard Gambles, PharmD, BCPS 05/23/2023 4:17 AM

## 2023-05-23 NOTE — Progress Notes (Signed)
 NAME:  [name], MRN:  [id number], DOB:  1945/10/04, LOS: 2 ADMISSION DATE:  05/20/2023, CONSULTATION DATE:  05/20/23 REFERRING MD:  Synetta Fail, MD , CHIEF COMPLAINT:  N/V/Abd pain / Resp failure   History of Present Illness:  HPI: [name] is a 78 y.o. female with medical history significant of hypertension, hyperlipidemia, diabetes, neuropathy, hypothyroidism, CKD 3B, CAD, GERD, CVA, chronic systolic CHF, depression, anxiety, ventricular tachycardia, ventricular fibrillation, atrial fibrillation, status post ICD, obesity, seizure disorder, low back pain, spinal stenosis presenting after feeling unwell for several days and recent ICD discharges.   Patient reports that she has been unwell for possible days.  She is experience nausea vomiting and abdominal pain.  She reports that her defibrillator fired multiple times today.  ED provider confirmed this with the [facility] representative who noted that the device did fire 3 times a day for ventricular fibrillation.  Also noted periods of slow ventricular tachycardia as well as atrial fibrillation.   Patient denies fevers, chills.   ED Course: Vital signs in the ED notable for blood pressure initially in the 70 systolic now improved to the 120s.  Respiratory rate in the 20s.  Requiring 3 L to maintain saturations.  Lab workup included CMP with potassium 3.2, bicarb 18, gap 17, BUN 32, creatinine elevated 2.37 baseline of 2, glucose 2 1, calcium 7.9, protein 6.0, albumin 2.5, T. bili 1.9, AST 1076, ALT 663.  CBC with platelets 142.  BNP elevated to 2548.  Troponin trend 339, 417.  Lipase normal.  Respiratory panel positive for flu A.  Lactic acid pending.  Urinalysis pending.  Blood cultures pending.  VBG with normal pH pCO2 40.7.  Magnesium pending.  Chest x-ray with new bilateral opacities consistent with pneumonia.  CT of the abdomen pelvis showing no acute normality in the abdomen pelvis, bilateral opacity at the lung bases,  left renal cysts.  Patient received ceftriaxone, doxycycline, Tamiflu in the ED.  Started on amiodarone infusion.  Also received morphine, 1 L IV fluids and 40 mEq p.o. potassium x 2.  Cardiology consulted and recommended amiodarone fusion, repletion of magnesium and potassium as needed, adding back beta-blocker as blood pressure allows  Pertinent  Medical History   has a past medical history of Acute kidney injury superimposed on chronic kidney disease (HCC) (10/02/2022), Allergy, Arthritis, Atrial fibrillation (HCC), Chronic renal insufficiency, Chronic systolic CHF (congestive heart failure) (HCC) (03/20/2013), Chronic systolic heart failure (HCC), Coronary artery disease (01/31/2011), Diabetes mellitus, 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, History of chicken pox, Hyperlipidemia, Hypertension, Ischemic cardiomyopathy, Knee MCL sprain (03/28/2014), Left knee pain (08/30/2012), Lumbar spondylosis (01/11/2012), Pes anserine bursitis (02/23/2014), Sleep apnea, Stroke (HCC) (2010), Suicide attempt by benzodiazepine overdose (HCC) (01/21/2020), UTI (urinary tract infection) (08/03/2013), and Ventricular tachycardia (HCC).   Significant Hospital Events: Including procedures, antibiotic start and stop dates in addition to other pertinent events   2/28 Remains on high-dose vasopressor support with Levophed. No more episodes of V. Tach. Remain acidotic mixed metabolic and respiratory 3/1 remains on ventilator with FiO2 50 PEEP 8, remains on both levo and vaso  Interim History / Subjective:  Vasopressor requirement has improved, Levophed was titrated off Currently on vasopressin Tolerating spontaneous breathing trial but with increased work of breathing Remain afebrile  Objective   Blood pressure 129/72, pulse 80, temperature 98.4 F (36.9 C), temperature source Bladder, resp. rate 17, height 5\' 2"  (1.575 m), weight 87.7 kg, SpO2 95%.    Vent Mode: CPAP;PSV FiO2 (%):  [  40 %-50 %] 40 % Set  Rate:  [28 bmp] 28 bmp Vt Set:  [400 mL] 400 mL PEEP:  [5 cmH20-8 cmH20] 8 cmH20 Pressure Support:  [5 cmH20-8 cmH20] 5 cmH20 Plateau Pressure:  [23 cmH20] 23 cmH20   Intake/Output Summary (Last 24 hours) at 05/23/2023 1039 Last data filed at 05/23/2023 1023 Gross per 24 hour  Intake 2436.34 ml  Output 990 ml  Net 1446.34 ml   Filed Weights   05/21/23 0350 05/22/23 0500 05/23/23 0500  Weight: 84.5 kg 86.7 kg 87.7 kg    Examination: General: Crtitically ill-appearing elderly female, orally intubated HEENT: [place]/AT, eyes anicteric.  ETT and OGT in place Neuro: Eyes open, intermittently following commands, generalized weak, moving all 4 extremities Chest: Bilateral expiratory wheezes, no crackles Heart: Regular rate and rhythm, no murmurs or gallops Abdomen: Soft, nondistended, bowel sounds present Skin: No rash  Labs and images reviewed  Resolved Hospital Problem list     Assessment & Plan:  Acute respiratory failure with hypoxia and hypercapnia Influenza pneumonia Bilateral superimposed bacterial pneumonia Continue lung protective ventilation VAP prevention bundle in place PAD protocol with low-dose fentanyl Tolerating spontaneous breathing trial but with increased work of breathing and bilateral wheezes Will start her on Solu-Medrol 40 mg twice daily Cultures have been negative Continue Tamiflu Continue droplet precautions Continue IV antibiotic to complete 7 days therapy  Severe sepsis with septic shock, POA Shock has improved Patient is off Levophed, vasopressin is being titrated off Continue IV ceftriaxone and doxycycline Cultures have been negative Lactate has trended down Stop hydrocortisone  Acute septic encephalopathy -improving Patient is awake, intermittently following commands, generalized weak Minimize sedation with RASS goal -1  Ventricular tachycardia likely in the setting of acute illness Appreciate EP cardiology follow-up Continue telemetry  monitoring Continue IV amiodarone  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 Echocardiogram October 2024 EF 35 to 40% with global hypokinesis and grade 1 diastolic dysfunction Home medications include carvedilol, Bentyl, Lasix, amiodarone, Crestor and Xarelto Clinically patient looks dry Holding GDMT and Lasix considering shock Continue aspirin  Paroxysmal A-fib/A-flutter Patient takes amiodarone and Xarelto at home Currently in sinus rhythm Continue amiodarone infusion Continue IV heparin infusion, switch to DOAC before discharge Continuous telemetry  AKI on CKD stage IIIb -slightly improved Creatinine on admission 2.60 with GFR 21 compared to creatinine 3.43 with GFR 13 2/28 Mixed respiratory and metabolic acidosis Hypokalemia Serum creatinine continue to improve, down to 2.8 Monitor intake and output Avoid nephrotoxic agent Will give gentle fluid hydration considering she looks dehydrated Acidosis has improved Continue aggressive electrolyte replacement  Shock liver LFTs started improving, will repeat LFTs in the morning Avoid hepatotoxic agent  Diabetes type 2, with hyperglycemia Home medications include insulin  Blood sugars are better controlled now Continue SSI Continue long-acting insulin  Anemia and thrombocytopenia of critical illness Monitor H&H and platelet count Watch for signs of bleeding  Best Practice (right click and "Reselect all SmartList Selections" daily)   Diet/type: NPO DVT prophylaxis IV heparin infusion Pressure ulcer(s): N/A GI prophylaxis: H2B Lines: right internal jugular CVL - 05/20/23 Foley: Yes needed Code Status:  full code Last date of multidisciplinary goals of care discussion: 3/2: Patient's sister was updated over the phone, granddaughter at bedside   The patient is critically ill due to acute respiratory failure with hypoxia and hypercapnia/septic shock.  Critical care was necessary to treat or prevent imminent  or life-threatening deterioration.  Critical care was time spent personally by me on the following activities:  development of treatment plan with patient and/or surrogate as well as nursing, discussions with consultants, evaluation of patient's response to treatment, examination of patient, obtaining history from patient or surrogate, ordering and performing treatments and interventions, ordering and review of laboratory studies, ordering and review of radiographic studies, pulse oximetry, re-evaluation of patient's condition and participation in multidisciplinary rounds.   During this encounter critical care time was devoted to patient care services described in this note for 42 minutes.   Cheri Fowler, MD [facility] See Amion for pager If no response to pager, please call [phone] until 7pm After 7pm, Please call E-link [phone]

## 2023-05-23 NOTE — Plan of Care (Signed)
   Problem: Tissue Perfusion: Goal: Adequacy of tissue perfusion will improve Outcome: Progressing

## 2023-05-23 NOTE — Progress Notes (Signed)
 Rounding Note    Patient Name: [name] Date of Encounter: 05/23/2023  [facility] Cardiologist: [name], MD   Subjective   Remains intubated.  No sedation.  Patient is awake.  Was not extubated yesterday due to continued respiratory issues.  No further ventricular tachycardia.  Did have wide-complex slow rhythm.  Remains in atrial fibrillation.  Inpatient Medications    Scheduled Meds:  aspirin  81 mg Per Tube Daily   busPIRone  5 mg Per Tube BID   Chlorhexidine Gluconate Cloth  6 each Topical Daily   gabapentin  200 mg Per Tube Q12H   hydrocortisone sod succinate (SOLU-CORTEF) inj  100 mg Intravenous Q12H   insulin aspart  0-15 Units Subcutaneous Q4H   insulin glargine  10 Units Subcutaneous Daily   ipratropium-albuterol  3 mL Nebulization Q6H   levETIRAcetam  250 mg Per Tube BID   mouth rinse  15 mL Mouth Rinse Q2H   oseltamivir  30 mg Per Tube Daily   sertraline  50 mg Per Tube QHS   sodium chloride flush  10-40 mL Intracatheter Q12H   Continuous Infusions:  amiodarone 30 mg/hr (05/23/23 0600)   cefTRIAXone (ROCEPHIN)  IV Stopped (05/22/23 1853)   doxycycline (VIBRAMYCIN) IV Stopped (05/22/23 2235)   famotidine (PEPCID) IV Stopped (05/22/23 2133)   fentaNYL infusion INTRAVENOUS 125 mcg/hr (05/23/23 0600)   heparin 600 Units/hr (05/23/23 0600)   lactated ringers Stopped (05/20/23 1457)   norepinephrine (LEVOPHED) Adult infusion 7 mcg/min (05/23/23 0600)   vasopressin 0.02 Units/min (05/23/23 0600)   PRN Meds: acetaminophen (TYLENOL) oral liquid 160 mg/5 mL, fentaNYL, ipratropium-albuterol, mouth rinse, polyethylene glycol, sodium chloride flush   Vital Signs    Vitals:   05/23/23 0500 05/23/23 0600 05/23/23 0730 05/23/23 0809  BP: 131/78 (!) 141/88    Pulse: 62 (!) 53 71 72  Resp: (!) 25 (!) 28 (!) 28 19  Temp:      TempSrc:      SpO2: 99% 96% 98% 98%  Weight: 87.7 kg     Height:        Intake/Output Summary (Last 24 hours) at  05/23/2023 0813 Last data filed at 05/23/2023 0739 Gross per 24 hour  Intake 2495.68 ml  Output 1015 ml  Net 1480.68 ml      05/23/2023    5:00 AM 05/22/2023    5:00 AM 05/21/2023    3:50 AM  Last 3 Weights  Weight (lbs) 193 lb 5.5 oz 191 lb 2.2 oz 186 lb 4.6 oz  Weight (kg) 87.7 kg 86.7 kg 84.5 kg      Telemetry    Atrial fibrillation with intermittent wide-complex beats- Personally Reviewed  ECG     - Personally Reviewed  Physical Exam   GEN: Intubated Neck: No JVD Cardiac: Irregular, no murmurs, rubs, or gallops.  Respiratory: Clear to auscultation bilaterally. GI: Soft, nontender, non-distended  MS: No edema; No deformity. Neuro:  Nonfocal  Psych: Normal affect   Labs    High Sensitivity Troponin:   Recent Labs  Lab 05/20/23 1259 05/20/23 1440  TROPONINIHS 339* 417*     Chemistry Recent Labs  Lab 05/20/23 1440 05/20/23 2208 05/21/23 0346 05/21/23 0941 05/22/23 0326 05/22/23 0748 05/23/23 0207  NA 138   < > 137   < > 133* 136 138  K 3.2*   < > 4.5   < > 4.1 3.9 3.2*  CL 103  --  105  --  99  --  99  CO2 18*  --  22  --  17*  --  23  GLUCOSE 201*  --  225*  --  211*  --  168*  BUN 32*  --  39*  --  43*  --  45*  CREATININE 2.37*  --  3.43*  --  3.28*  --  2.80*  CALCIUM 7.9*  --  7.7*  --  7.0*  --  7.6*  MG 1.6*  --  2.1  --  1.7  --   --   PROT 6.0*  --  5.7*  --  5.4*  --   --   ALBUMIN 2.5*  --  2.2*  --  1.9*  --   --   AST 1,076*  --  1,475*  --  1,151*  --   --   ALT 663*  --  777*  --  773*  --   --   ALKPHOS 59  --  51  --  56  --   --   BILITOT 1.9*  --  1.5*  --  2.1*  --   --   GFRNONAA 21*  --  13*  --  14*  --  17*  ANIONGAP 17*  --  10  --  17*  --  16*   < > = values in this interval not displayed.    Lipids No results for input(s): "CHOL", "TRIG", "HDL", "LABVLDL", "LDLCALC", "CHOLHDL" in the last 168 hours.  Hematology Recent Labs  Lab 05/21/23 0346 05/21/23 0941 05/22/23 0407 05/22/23 0748 05/23/23 0207  WBC 6.2  --  9.2   --  9.0  RBC 4.03  --  3.76*  --  3.72*  HGB 11.4*   < > 11.0* 11.6* 10.5*  HCT 37.4   < > 32.9* 34.0* 32.0*  MCV 92.8  --  87.5  --  86.0  MCH 28.3  --  29.3  --  28.2  MCHC 30.5  --  33.4  --  32.8  RDW 17.4*  --  17.8*  --  18.3*  PLT 146*  --  127*  --  144*   < > = values in this interval not displayed.   Thyroid No results for input(s): "TSH", "FREET4" in the last 168 hours.  BNP Recent Labs  Lab 05/20/23 1223  BNP 2,548.1*    DDimer No results for input(s): "DDIMER" in the last 168 hours.   Radiology    No results found.  Cardiac Studies     Patient Profile     78 y.o. female presented to the hospital with VT noted on ICD, found to have influenza A with progressive respiratory distress, now intubated  Assessment & Plan    1.  Ventricular tachycardia: No further episodes of tachycardia.  Has had wide QRS complex rhythm.  Currently on IV amiodarone.  Would continue for now.  Potentially had VT due to acute illness.  2.  Influenza A: Continue Tamiflu  3.  Acute respiratory failure with hypoxia and hypercapnia: Patient is intubated.  Continues to have significant infiltrates on x-ray.  Plan per primary team.  4.  Chronic systolic heart failure: No obvious volume overload.  5.  Chronic chronic CKD stage IIIa: Creatinine has been improving.  Continue current management.  6.  Paroxysmal atrial fibrillation: Patient has been in and out of atrial fibrillation.  Currently on IV amiodarone.  Continue heparin for stroke prevention.     For questions or updates, please  contact [facility] Please consult [email or web address] for contact info under        Signed, Cletis Muma [name], MD  05/23/2023, 8:13 AM

## 2023-05-23 NOTE — Progress Notes (Signed)
 eLink Physician-Brief Progress Note Patient Name: [name] DOB: 1945/12/11 MRN: [id number]   Date of Service  05/23/2023  HPI/Events of Note  K is 3.2 and creat is better this AM   eICU Interventions  40 meq orl K and follow up K at 11 am      Intervention Category Major Interventions: Electrolyte abnormality - evaluation and management  Oretha Milch 05/23/2023, 3:14 AM

## 2023-05-23 NOTE — Progress Notes (Signed)
 PHARMACY - ANTICOAGULATION CONSULT NOTE  Pharmacy Consult for heparin  Indication: atrial fibrillation  Allergies  Allergen Reactions   Veltassa [Patiromer] Diarrhea   Aldactone [Spironolactone] Diarrhea and Nausea And Vomiting   Darvon [Propoxyphene] Other (See Comments)    Indigestion    Mexitil [Mexiletine] Nausea And Vomiting and Other (See Comments)    Tremors Difficulty walking Blurred vision   Phenergan [Promethazine Hcl] Other (See Comments)    Hyperactivity    Ranexa [Ranolazine Er] Diarrhea   Plavix [Clopidogrel Bisulfate] Rash    Patient Measurements: Height: 5\' 2"  (157.5 cm) Weight: 87.7 kg (193 lb 5.5 oz) IBW/kg (Calculated) : 50.1 HEPARIN DW (KG): 68.6   Vital Signs: Temp: 98.4 F (36.9 C) (03/02 0800) Temp Source: Bladder (03/02 0800) BP: 129/72 (03/02 1000) Pulse Rate: 63 (03/02 1140)  Labs: Recent Labs    05/20/23 1259 05/20/23 1340 05/20/23 1440 05/20/23 2208 05/21/23 0346 05/21/23 0941 05/21/23 2308 05/22/23 0326 05/22/23 0407 05/22/23 0748 05/22/23 1018 05/22/23 1549 05/23/23 0207 05/23/23 1118  HGB  --    < >  --    < > 11.4*   < >  --   --  11.0* 11.6*  --   --  10.5*  --   HCT  --    < >  --    < > 37.4   < >  --   --  32.9* 34.0*  --   --  32.0*  --   PLT  --   --   --   --  146*  --   --   --  127*  --   --   --  144*  --   APTT  --   --   --   --   --   --  >200*  --   --   --    < > 91* 106* 61*  HEPARINUNFRC  --   --   --   --   --   --  >1.10*  --   --   --   --   --  1.00*  --   CREATININE  --    < > 2.37*  --  3.43*  --   --  3.28*  --   --   --   --  2.80*  --   TROPONINIHS 339*  --  417*  --   --   --   --   --   --   --   --   --   --   --    < > = values in this interval not displayed.    Estimated Creatinine Clearance: 17.6 mL/min (A) (by C-G formula based on SCr of 2.8 mg/dL (H)).   Medical History: Past Medical History:  Diagnosis Date   Acute kidney injury superimposed on chronic kidney disease (HCC) 10/02/2022    Allergy    Arthritis    Atrial fibrillation (HCC)    controlled with amiodarone, on coumadin   Chronic renal insufficiency    Chronic systolic CHF (congestive heart failure) (HCC) 03/20/2013   Chronic systolic heart failure (HCC)    Coronary artery disease 01/31/2011   Diabetes mellitus    type 1   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    History of chicken pox    Hyperlipidemia    Hypertension    Ischemic cardiomyopathy    Knee MCL sprain 03/28/2014   Left knee pain 08/30/2012  Lumbar spondylosis 01/11/2012   Pes anserine bursitis 02/23/2014   Sleep apnea    Stroke [facility]) 2010   eye doctor said she had TIA   Suicide attempt by benzodiazepine overdose (HCC) 01/21/2020   UTI (urinary tract infection) 08/03/2013   Ventricular tachycardia (HCC)    Polymorphic    Medications:  Medications Prior to Admission  Medication Sig Dispense Refill Last Dose/Taking   amiodarone (PACERONE) 200 MG tablet Take 1 tablet (200 mg total) by mouth daily. 30 tablet 6 05/20/2023 Morning   aspirin EC 81 MG tablet Take 1 tablet (81 mg total) by mouth daily. Swallow whole. 30 tablet 12 05/20/2023 Morning   busPIRone (BUSPAR) 5 MG tablet Take 1 tablet (5 mg total) by mouth 2 (two) times daily. 60 tablet 1 05/20/2023 Morning   carvedilol (COREG) 25 MG tablet TAKE 1 TABLET (25 MG TOTAL) BY MOUTH TWICE A DAY WITH MEALS 180 tablet 3 05/20/2023 Morning   Cholecalciferol (VITAMIN D-3 PO) Take 1 tablet by mouth daily.   05/20/2023 Morning   dicyclomine (BENTYL) 10 MG capsule TAKE 1 CAPSULE (10 MG TOTAL) BY MOUTH 4 (FOUR) TIMES DAILY BEFORE MEALS AND AT BEDTIME. 120 capsule 1 05/20/2023 Morning   furosemide (LASIX) 20 MG tablet Take 2 tablets (40 mg) by mouth once a day. 60 tablet 11 05/20/2023 Morning   gabapentin (NEURONTIN) 100 MG capsule Take 200 mg by mouth 2 (two) times daily.   05/20/2023 Morning   insulin aspart (NOVOLOG FLEXPEN) 100 UNIT/ML FlexPen Inject up to 15 units daily under skin as advised  (Patient taking differently: Inject 4-5 Units into the skin 3 (three) times daily before meals. Inject up to 15 units daily under skin as advised) 15 mL 11 Past Week   insulin degludec (TRESIBA FLEXTOUCH) 100 UNIT/ML FlexTouch Pen Inject 12-14 Units into the skin daily. (Patient taking differently: Inject 14 Units into the skin at bedtime.) 9 mL 3 05/19/2023 Bedtime   levETIRAcetam (KEPPRA XR) 500 MG 24 hr tablet Take 1 tablet (500 mg total) by mouth at bedtime. 90 tablet 3 05/19/2023 Bedtime   losartan (COZAAR) 25 MG tablet Take 25 mg by mouth daily.   05/20/2023 Morning   Multiple Vitamins-Minerals (EYE VITAMINS PO) Take 1 tablet by mouth 2 (two) times daily.   05/20/2023 Morning   nitroGLYCERIN (NITROSTAT) 0.4 MG SL tablet Place 1 tablet (0.4 mg total) under the tongue every 5 (five) minutes as needed for chest pain. 25 tablet 3 Past Month   omeprazole (PRILOSEC) 20 MG capsule Take 1 capsule by mouth daily.   05/20/2023 Morning   Rivaroxaban (XARELTO) 15 MG TABS tablet Take 1 tablet (15 mg total) by mouth daily with supper. (Patient taking differently: Take 15 mg by mouth at bedtime.) 30 tablet 6 05/19/2023 Bedtime   rosuvastatin (CRESTOR) 40 MG tablet Take 1 tablet (40 mg total) by mouth daily. NEEDS FOLLOW UP APPOINTMENT FOR MORE REFILLS (Patient taking differently: Take 40 mg by mouth at bedtime.) 90 tablet 0 05/19/2023 Bedtime   sertraline (ZOLOFT) 50 MG tablet Take 50 mg by mouth at bedtime.   05/19/2023 Bedtime   Continuous Glucose Sensor (FREESTYLE LIBRE 3 SENSOR) MISC 1 each by Does not apply route every 14 (fourteen) days. 6 each 3    magnesium oxide (MAG-OX) 400 (240 Mg) MG tablet TAKE 1 TABLET BY MOUTH TWICE A DAY (Patient not taking: Reported on 05/20/2023) 180 tablet 1 Not Taking   Scheduled:   aspirin  81 mg Per Tube Daily   busPIRone  5 mg Per Tube BID   Chlorhexidine Gluconate Cloth  6 each Topical Daily   gabapentin  200 mg Per Tube Q12H   insulin aspart  0-15 Units Subcutaneous Q4H    insulin glargine  10 Units Subcutaneous Daily   ipratropium-albuterol  3 mL Nebulization Q6H   levETIRAcetam  250 mg Per Tube BID   methylPREDNISolone (SOLU-MEDROL) injection  40 mg Intravenous Q12H   mouth rinse  15 mL Mouth Rinse Q2H   oseltamivir  30 mg Per Tube Daily   polyethylene glycol  17 g Per Tube Daily   potassium chloride  40 mEq Per Tube Once   sennosides  5 mL Per Tube BID   sertraline  50 mg Per Tube QHS    Assessment: 78 yo female here with VT and ICD shock noted with, VDRF and AKI. 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 Pharmacy consulted to dose heparin.   aPTT is subtherapeutic at 61, on 600 units/hr. Hgb 10.5, plt 144. No s/sx of bleeding or infusion issues. Running peripherally and drawn from A-line.  Goal of Therapy:  Heparin level 0.3-0.7 units/ml aPTT 66-102 seconds Monitor platelets by anticoagulation protocol: Yes   Plan:  Increase IV heparin infusion at 700 units/hr. Check aPTT in 8 hrs Daily heparin level, aPTT and CBC.  Thank you for allowing pharmacy to participate in this patient's care,  Sherron Monday, PharmD, BCCCP Clinical Pharmacist  Phone: [phone] 05/23/2023 12:27 PM  Please check AMION for all [facility] Pharmacy phone numbers After 10:00 PM, call Main Pharmacy [phone]

## 2023-05-24 DIAGNOSIS — E1165 Type 2 diabetes mellitus with hyperglycemia: Secondary | ICD-10-CM | POA: Diagnosis not present

## 2023-05-24 DIAGNOSIS — I5042 Chronic combined systolic (congestive) and diastolic (congestive) heart failure: Secondary | ICD-10-CM | POA: Diagnosis not present

## 2023-05-24 DIAGNOSIS — J9602 Acute respiratory failure with hypercapnia: Secondary | ICD-10-CM

## 2023-05-24 DIAGNOSIS — J9601 Acute respiratory failure with hypoxia: Secondary | ICD-10-CM | POA: Diagnosis not present

## 2023-05-24 DIAGNOSIS — J101 Influenza due to other identified influenza virus with other respiratory manifestations: Secondary | ICD-10-CM

## 2023-05-24 DIAGNOSIS — D638 Anemia in other chronic diseases classified elsewhere: Secondary | ICD-10-CM

## 2023-05-24 DIAGNOSIS — A419 Sepsis, unspecified organism: Secondary | ICD-10-CM | POA: Diagnosis not present

## 2023-05-24 DIAGNOSIS — I4901 Ventricular fibrillation: Secondary | ICD-10-CM | POA: Diagnosis not present

## 2023-05-24 DIAGNOSIS — N1832 Chronic kidney disease, stage 3b: Secondary | ICD-10-CM | POA: Diagnosis not present

## 2023-05-24 LAB — COMPREHENSIVE METABOLIC PANEL
ALT: 385 U/L — ABNORMAL HIGH (ref 0–44)
AST: 216 U/L — ABNORMAL HIGH (ref 15–41)
Albumin: 1.9 g/dL — ABNORMAL LOW (ref 3.5–5.0)
Alkaline Phosphatase: 69 U/L (ref 38–126)
Anion gap: 14 (ref 5–15)
BUN: 51 mg/dL — ABNORMAL HIGH (ref 8–23)
CO2: 21 mmol/L — ABNORMAL LOW (ref 22–32)
Calcium: 8.1 mg/dL — ABNORMAL LOW (ref 8.9–10.3)
Chloride: 103 mmol/L (ref 98–111)
Creatinine, Ser: 2.57 mg/dL — ABNORMAL HIGH (ref 0.44–1.00)
GFR, Estimated: 19 mL/min — ABNORMAL LOW (ref >60)
Glucose, Bld: 140 mg/dL — ABNORMAL HIGH (ref 70–99)
Potassium: 4.1 mmol/L (ref 3.5–5.1)
Sodium: 138 mmol/L (ref 135–145)
Total Bilirubin: 1.4 mg/dL — ABNORMAL HIGH (ref 0.0–1.2)
Total Protein: 5.4 g/dL — ABNORMAL LOW (ref 6.5–8.1)

## 2023-05-24 LAB — GLUCOSE, CAPILLARY
Glucose-Capillary: 130 mg/dL — ABNORMAL HIGH (ref 70–99)
Glucose-Capillary: 127 mg/dL — ABNORMAL HIGH (ref 70–99)
Glucose-Capillary: 144 mg/dL — ABNORMAL HIGH (ref 70–99)
Glucose-Capillary: 141 mg/dL — ABNORMAL HIGH (ref 70–99)
Glucose-Capillary: 103 mg/dL — ABNORMAL HIGH (ref 70–99)
Glucose-Capillary: 122 mg/dL — ABNORMAL HIGH (ref 70–99)

## 2023-05-24 LAB — BLOOD GAS, ARTERIAL
Acid-base deficit: 3.3 mmol/L — ABNORMAL HIGH (ref 0.0–2.0)
Bicarbonate: 21.3 mmol/L (ref 20.0–28.0)
Drawn by: 70606
O2 Saturation: 99.8 %
Patient temperature: 37
pCO2 arterial: 36 mmHg (ref 32–48)
pH, Arterial: 7.38 (ref 7.35–7.45)
pO2, Arterial: 117 mmHg — ABNORMAL HIGH (ref 83–108)

## 2023-05-24 LAB — POCT I-STAT 7, (LYTES, BLD GAS, ICA,H+H)
Acid-base deficit: 4 mmol/L — ABNORMAL HIGH (ref 0.0–2.0)
Bicarbonate: 20.2 mmol/L (ref 20.0–28.0)
Calcium, Ion: 1.14 mmol/L — ABNORMAL LOW (ref 1.15–1.40)
HCT: 34 % — ABNORMAL LOW (ref 36.0–46.0)
Hemoglobin: 11.6 g/dL — ABNORMAL LOW (ref 12.0–15.0)
O2 Saturation: 96 %
Patient temperature: 37.1
Potassium: 3.6 mmol/L (ref 3.5–5.1)
Sodium: 141 mmol/L (ref 135–145)
TCO2: 21 mmol/L — ABNORMAL LOW (ref 22–32)
pCO2 arterial: 32.2 mmHg (ref 32–48)
pH, Arterial: 7.406 (ref 7.35–7.45)
pO2, Arterial: 78 mmHg — ABNORMAL LOW (ref 83–108)

## 2023-05-24 LAB — APTT
aPTT: 61 s — ABNORMAL HIGH (ref 24–36)
aPTT: 81 s — ABNORMAL HIGH (ref 24–36)

## 2023-05-24 LAB — CBC
HCT: 30.9 % — ABNORMAL LOW (ref 36.0–46.0)
Hemoglobin: 10.5 g/dL — ABNORMAL LOW (ref 12.0–15.0)
MCH: 29.2 pg (ref 26.0–34.0)
MCHC: 34 g/dL (ref 30.0–36.0)
MCV: 85.8 fL (ref 80.0–100.0)
Platelets: 119 10*3/uL — ABNORMAL LOW (ref 150–400)
RBC: 3.6 MIL/uL — ABNORMAL LOW (ref 3.87–5.11)
RDW: 18.8 % — ABNORMAL HIGH (ref 11.5–15.5)
WBC: 6 10*3/uL (ref 4.0–10.5)
nRBC: 0 % (ref 0.0–0.2)

## 2023-05-24 LAB — HEPARIN LEVEL (UNFRACTIONATED): Heparin Unfractionated: 0.53 [IU]/mL (ref 0.30–0.70)

## 2023-05-24 LAB — MAGNESIUM: Magnesium: 2.2 mg/dL (ref 1.7–2.4)

## 2023-05-24 MED ORDER — LABETALOL HCL 5 MG/ML IV SOLN
10.0000 mg | Freq: Four times a day (QID) | INTRAVENOUS | Status: AC | PRN
  Administered 2023-05-24 – 2023-05-25 (×3): 10 mg via INTRAVENOUS
  Filled 2023-05-24: qty 4

## 2023-05-24 MED ORDER — LACTATED RINGERS IV SOLN: INTRAVENOUS | Status: DC

## 2023-05-24 MED ORDER — HYDRALAZINE HCL 20 MG/ML IJ SOLN
10.0000 mg | Freq: Once | INTRAMUSCULAR | Status: AC
  Administered 2023-05-24: 10 mg via INTRAVENOUS
  Filled 2023-05-24: qty 1

## 2023-05-24 MED ORDER — POLYETHYLENE GLYCOL 3350 17 G PO PACK
17.0000 g | PACK | Freq: Two times a day (BID) | ORAL | Status: DC
  Administered 2023-05-26 – 2023-06-02 (×9): 17 g
  Filled 2023-05-24 (×10): qty 1

## 2023-05-24 MED ORDER — BISACODYL 10 MG RE SUPP
10.0000 mg | Freq: Every day | RECTAL | Status: DC | PRN
Start: 2023-05-24 — End: 2023-06-04
  Administered 2023-05-24: 10 mg via RECTAL
  Filled 2023-05-24: qty 1

## 2023-05-24 MED ORDER — SORBITOL 70 % SOLN
30.0000 mL | Freq: Once | Status: DC
Start: 2023-05-24 — End: 2023-05-24

## 2023-05-24 MED ORDER — LABETALOL HCL 5 MG/ML IV SOLN
10.0000 mg | Freq: Once | INTRAVENOUS | Status: AC
  Administered 2023-05-24: 10 mg via INTRAVENOUS
  Filled 2023-05-24: qty 4

## 2023-05-24 MED ORDER — HYDRALAZINE HCL 20 MG/ML IJ SOLN
20.0000 mg | Freq: Once | INTRAMUSCULAR | Status: AC
  Administered 2023-05-24: 20 mg via INTRAVENOUS
  Filled 2023-05-24: qty 1

## 2023-05-24 MED ORDER — LEVETIRACETAM 500 MG/5ML IV SOLN
250.0000 mg | Freq: Two times a day (BID) | INTRAVENOUS | Status: DC
  Administered 2023-05-24 – 2023-06-04 (×22): 250 mg via INTRAVENOUS
  Filled 2023-05-24 (×23): qty 2.5

## 2023-05-24 NOTE — Progress Notes (Signed)
 PHARMACY - ANTICOAGULATION CONSULT NOTE  Pharmacy Consult for heparin Indication: atrial fibrillation  Labs: Recent Labs    05/21/23 2308 05/22/23 0326 05/22/23 0407 05/22/23 0748 05/22/23 1018 05/23/23 0207 05/23/23 1118 05/23/23 2121 05/24/23 0405 05/24/23 0515  HGB  --   --  11.0* 11.6*  --  10.5*  --   --  10.5*  --   HCT  --   --  32.9* 34.0*  --  32.0*  --   --  30.9*  --   PLT  --   --  127*  --   --  144*  --   --  119*  --   APTT >200*  --   --   --    < > 106* 61* 35  --  61*  HEPARINUNFRC >1.10*  --   --   --   --  1.00*  --   --   --  0.53  CREATININE  --  3.28*  --   --   --  2.80*  --  2.54*  --   --    < > = values in this interval not displayed.   Assessment: 78yo female subtherapeutic on heparin after rate change but closer to goal; no infusion issues or signs of bleeding per RN.  Goal of Therapy:  aPTT 66-102 seconds   Plan:  Increase heparin infusion by ~1 unit/kg/hr to 1000 units/hr. Check PTT in 8 hours.   Vernard Gambles, PharmD, BCPS 05/24/2023 6:09 AM

## 2023-05-24 NOTE — Progress Notes (Signed)
 NAME:  [name], MRN:  [id number], DOB:  04-01-1945, LOS: 3 ADMISSION DATE:  05/20/2023, CONSULTATION DATE:  05/20/2023 REFERRING MD:  Alinda Money - [facility] CHIEF COMPLAINT:  N/V/Abd pain / Resp failure   History of Present Illness:  78 year old female with medical history significant of hypertension, hyperlipidemia, diabetes, neuropathy, hypothyroidism, CKD 3B, CAD, GERD, CVA, chronic systolic CHF, depression, anxiety, ventricular tachycardia, ventricular fibrillation, atrial fibrillation, status post ICD, obesity, seizure disorder, low back pain, spinal stenosis presenting after feeling unwell for several days and recent ICD discharges.   Patient reports that she has been unwell for possible days.  She is experience nausea vomiting and abdominal pain.  She reports that her defibrillator fired multiple times today.  ED provider confirmed this with the [facility] representative who noted that the device did fire 3 times a day for ventricular fibrillation.  Also noted periods of slow ventricular tachycardia as well as atrial fibrillation.   Patient denies fevers, chills.   ED Course: Vital signs in the ED notable for blood pressure initially in the 70 systolic now improved to the 120s.  Respiratory rate in the 20s.  Requiring 3 L to maintain saturations.  Lab workup included CMP with potassium 3.2, bicarb 18, gap 17, BUN 32, creatinine elevated 2.37 baseline of 2, glucose 2 1, calcium 7.9, protein 6.0, albumin 2.5, T. bili 1.9, AST 1076, ALT 663.  CBC with platelets 142.  BNP elevated to 2548.  Troponin trend 339, 417.  Lipase normal.  Respiratory panel positive for flu A.  Lactic acid pending.  Urinalysis pending.  Blood cultures pending.  VBG with normal pH pCO2 40.7.  Magnesium pending.  Chest x-ray with new bilateral opacities consistent with pneumonia.  CT of the abdomen pelvis showing no acute normality in the abdomen pelvis, bilateral opacity at the lung bases, left renal cysts.  Patient received  ceftriaxone, doxycycline, Tamiflu in the ED.  Started on amiodarone infusion.  Also received morphine, 1 L IV fluids and 40 mEq p.o. potassium x 2.  Cardiology consulted and recommended amiodarone fusion, repletion of magnesium and potassium as needed, adding back beta-blocker as blood pressure allows.  Pertinent Medical History:   has a past medical history of Acute kidney injury superimposed on chronic kidney disease (HCC) (10/02/2022), Allergy, Arthritis, Atrial fibrillation (HCC), Chronic renal insufficiency, Chronic systolic CHF (congestive heart failure) (HCC) (03/20/2013), Chronic systolic heart failure (HCC), Coronary artery disease (01/31/2011), Diabetes mellitus, 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, History of chicken pox, Hyperlipidemia, Hypertension, Ischemic cardiomyopathy, Knee MCL sprain (03/28/2014), Left knee pain (08/30/2012), Lumbar spondylosis (01/11/2012), Pes anserine bursitis (02/23/2014), Sleep apnea, Stroke (HCC) (2010), Suicide attempt by benzodiazepine overdose (HCC) (01/21/2020), UTI (urinary tract infection) (08/03/2013), and Ventricular tachycardia (HCC).   Significant Hospital Events: Including procedures, antibiotic start and stop dates in addition to other pertinent events   2/27 Presented to ED feeling unwell, s/p AICD discharge x 3 for VT/slow VT. Hypotensive with increased WOB prompting intubation. Flu A+,  CXR c/w PNA. 2/28 Remains on high-dose vasopressor support with Levophed. No more episodes of V. Tach. Remain acidotic mixed metabolic and respiratory. 3/1 Remains on ventilator with FiO2 50 PEEP 8, remains on both levo and vaso  Interim History / Subjective:  No significant events overnight Sedation off this AM, following commands Tolerating SBT, weaning on vent Slight decrease in UOP, Cr stably elevated but down from peak (2/28) Electrolytes repleted (K, Phos, Mg) Remains on amio gtt/heparin  Objective:  Blood pressure (!) 148/83, pulse 99,  temperature 98.6 F (37 C), resp. rate 16, height 5\' 2"  (1.575 m), weight 92 kg, SpO2 97%.    Vent Mode: PRVC FiO2 (%):  [40 %] 40 % Set Rate:  [28 bmp] 28 bmp Vt Set:  [400 mL] 400 mL PEEP:  [5 cmH20-8 cmH20] 8 cmH20 Pressure Support:  [5 cmH20] 5 cmH20 Plateau Pressure:  [22 cmH20-23 cmH20] 22 cmH20   Intake/Output Summary (Last 24 hours) at 05/24/2023 0724 Last data filed at 05/24/2023 0700 Gross per 24 hour  Intake 3088.98 ml  Output 950 ml  Net 2138.98 ml   Filed Weights   05/22/23 0500 05/23/23 0500 05/24/23 0500  Weight: 86.7 kg 87.7 kg 92 kg   Physical Examination: General: Acutely ill-appearing older woman in NAD. Appears mildly uncomfortable. HEENT: [place]/AT, anicteric sclera, PERRL 3mm, moist mucous membranes. Bite block in place. Neuro:  Awake, unable to assess orientation.  Responds to verbal stimuli. Following commands intermittently. +Cough and +Gag  CV: RRR, no m/g/r. PULM: Breathing even and unlabored on vent (PEEP 8, FiO2 40%). Lung fields diminished bilaterally with scattered rhonchi, occasional expiratory wheeze on L. GI: Soft, nontender, nondistended. Normoactive bowel sounds. Extremities: Bilateral symmetric 1+ pitting LE edema noted. Skin: Warm/dry, no rashes. Scattered ecchymosis to bilateral AC (blood draws).  Resolved Hospital Problem List:    Assessment & Plan:  Acute respiratory failure with hypoxia and hypercapnia Influenza pneumonia Bilateral superimposed bacterial pneumonia - Continue full vent support (4-8cc/kg IBW), weaning as able, possible extubation today 3/3 - Wean FiO2 for O2 sat > 90% - Daily WUA/SBT as mental status tolerates, sedation off this AM (3/3) - Continue Solumedrol - VAP bundle - Pulmonary hygiene - PAD protocol for sedation: Fentanyl for goal RASS 0 to -1 - Continue Tamiflu, droplet precautions - Ceftriaxone/doxycyline x 7-day course (end 3/5)  Severe sepsis with septic shock, POA (improved) - Goal MAP > 65 - Fluid  resuscitation as tolerated, caution in the setting of CHF - Off of vasopressors at present - Trend WBC, fever curve - F/u Cx data (NGTD) - Continue broad-spectrum antibiotics (ceftriaxone/doxy) as above  Acute septic encephalopathy - improving Patient is awake, intermittently following commands, generalized weak - Following commands with sedation off - Plan for extubation today 3/3 - Correct metabolic derangements  Ventricular tachycardia likely in the setting of acute illness Paroxysmal A-fib/A-flutter Patient takes amiodarone and Xarelto at home. AICD in place. 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 Serum creatinine continue to improve, down to 2.57. - Trend BMP - Replete electrolytes as indicated - Monitor I&Os - Avoid nephrotoxic agents as able - Ensure adequate renal perfusion  Shock liver LFTs continue to downtrend. - Trend LFTs to normal  Diabetes type 2, with hyperglycemia - SSI - CBGs Q4H - Goal CBG 140-180  Anemia and thrombocytopenia of critical illness - Trend H&H, Plt - Monitor for signs of active bleeding - Transfuse for Hgb < 7.0, Plt < 20K or hemodynamically significant  bleeding  Best Practice (right click and "Reselect all SmartList Selections" daily)   Diet/type: NPO DVT prophylaxis IV heparin infusion Pressure ulcer(s):  N/A GI prophylaxis: H2B Lines: right internal jugular CVL - 05/20/23 Foley: Yes needed Code Status:  full code Last date of multidisciplinary goals of care discussion: 3/2: Patient's sister was updated over the phone, granddaughter at bedside  Critical care time:   The patient is critically ill with multiple organ system failure and requires high complexity decision making for assessment and support, frequent evaluation and titration of therapies, advanced monitoring, review of radiographic studies and interpretation of complex data.   Critical Care Time devoted to patient care services, exclusive of separately billable procedures, described in this note is 39 minutes.  Tim Lair, PA-C [facility] 05/24/23 7:26 AM  Please see [email or web address] for pager details.  From 7A-7P if no response, please call [phone] After hours, please call ELink [phone]

## 2023-05-24 NOTE — Progress Notes (Signed)
 Tele/chart check Remains intubated Rate controlled AFib, intemittent brief 1-4 bets wide complex beats ( rates 60s)  EP will follow from afar Continue amiodarone and heparin gtts  Francis Dowse, PA-C

## 2023-05-24 NOTE — Progress Notes (Signed)
 eLink Physician-Brief Progress Note Patient Name: [name] DOB: [birth year] MRN: [id number]   Date of Service  05/24/2023  HPI/Events of Note  Notified of BP 184/77, HR 63 Had received labetalol earlier  eICU Interventions  One time hydralazine 10mg  IV ordered.  Will continue to monitor closely.         [name] 05/24/2023, 8:52 PM  5:11 AM Notified of K 3.2 Placed order for Kcl be given by central line.  Would be cautious given AKI

## 2023-05-24 NOTE — Progress Notes (Signed)
 PHARMACY - ANTICOAGULATION CONSULT NOTE  Pharmacy Consult for heparin  Indication: atrial fibrillation  Allergies  Allergen Reactions   Veltassa [Patiromer] Diarrhea   Aldactone [Spironolactone] Diarrhea and Nausea And Vomiting   Darvon [Propoxyphene] Other (See Comments)    Indigestion    Mexitil [Mexiletine] Nausea And Vomiting and Other (See Comments)    Tremors Difficulty walking Blurred vision   Phenergan [Promethazine Hcl] Other (See Comments)    Hyperactivity    Ranexa [Ranolazine Er] Diarrhea   Plavix [Clopidogrel Bisulfate] Rash    Patient Measurements: Height: 5\' 2"  (157.5 cm) Weight: 92 kg (202 lb 13.2 oz) IBW/kg (Calculated) : 50.1 HEPARIN DW (KG): 68.6   Vital Signs: Temp: 98.8 F (37.1 C) (03/03 1200) Temp Source: Bladder (03/03 1200) BP: 131/75 (03/03 1500) Pulse Rate: 70 (03/03 1500)  Labs: Recent Labs    05/21/23 2308 05/22/23 0326 05/22/23 0407 05/22/23 0748 05/23/23 0207 05/23/23 1118 05/23/23 2121 05/24/23 0405 05/24/23 0515 05/24/23 1244 05/24/23 1308  HGB  --   --  11.0*   < > 10.5*  --   --  10.5*  --  11.6*  --   HCT  --   --  32.9*   < > 32.0*  --   --  30.9*  --  34.0*  --   PLT  --   --  127*  --  144*  --   --  119*  --   --   --   APTT >200*  --   --    < > 106*   < > 35  --  61*  --  81*  HEPARINUNFRC >1.10*  --   --   --  1.00*  --   --   --  0.53  --   --   CREATININE  --    < >  --   --  2.80*  --  2.54*  --  2.57*  --   --    < > = values in this interval not displayed.    Estimated Creatinine Clearance: 19.7 mL/min (A) (by C-G formula based on SCr of 2.57 mg/dL (H)).   Medical History: Past Medical History:  Diagnosis Date   Acute kidney injury superimposed on chronic kidney disease (HCC) 10/02/2022   Allergy    Arthritis    Atrial fibrillation (HCC)    controlled with amiodarone, on coumadin   Chronic renal insufficiency    Chronic systolic CHF (congestive heart failure) (HCC) 03/20/2013   Chronic systolic  heart failure (HCC)    Coronary artery disease 01/31/2011   Diabetes mellitus    type 1   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    History of chicken pox    Hyperlipidemia    Hypertension    Ischemic cardiomyopathy    Knee MCL sprain 03/28/2014   Left knee pain 08/30/2012   Lumbar spondylosis 01/11/2012   Pes anserine bursitis 02/23/2014   Sleep apnea    Stroke (HCC) 2010   eye doctor said she had TIA   Suicide attempt by benzodiazepine overdose (HCC) 01/21/2020   UTI (urinary tract infection) 08/03/2013   Ventricular tachycardia (HCC)    Polymorphic    Medications:  Medications Prior to Admission  Medication Sig Dispense Refill Last Dose/Taking   amiodarone (PACERONE) 200 MG tablet Take 1 tablet (200 mg total) by mouth daily. 30 tablet 6 05/20/2023 Morning   aspirin EC 81 MG tablet Take 1 tablet (81 mg total) by mouth daily.  Swallow whole. 30 tablet 12 05/20/2023 Morning   busPIRone (BUSPAR) 5 MG tablet Take 1 tablet (5 mg total) by mouth 2 (two) times daily. 60 tablet 1 05/20/2023 Morning   carvedilol (COREG) 25 MG tablet TAKE 1 TABLET (25 MG TOTAL) BY MOUTH TWICE A DAY WITH MEALS 180 tablet 3 05/20/2023 Morning   Cholecalciferol (VITAMIN D-3 PO) Take 1 tablet by mouth daily.   05/20/2023 Morning   dicyclomine (BENTYL) 10 MG capsule TAKE 1 CAPSULE (10 MG TOTAL) BY MOUTH 4 (FOUR) TIMES DAILY BEFORE MEALS AND AT BEDTIME. 120 capsule 1 05/20/2023 Morning   furosemide (LASIX) 20 MG tablet Take 2 tablets (40 mg) by mouth once a day. 60 tablet 11 05/20/2023 Morning   gabapentin (NEURONTIN) 100 MG capsule Take 200 mg by mouth 2 (two) times daily.   05/20/2023 Morning   insulin aspart (NOVOLOG FLEXPEN) 100 UNIT/ML FlexPen Inject up to 15 units daily under skin as advised (Patient taking differently: Inject 4-5 Units into the skin 3 (three) times daily before meals. Inject up to 15 units daily under skin as advised) 15 mL 11 Past Week   insulin degludec (TRESIBA FLEXTOUCH) 100 UNIT/ML  FlexTouch Pen Inject 12-14 Units into the skin daily. (Patient taking differently: Inject 14 Units into the skin at bedtime.) 9 mL 3 05/19/2023 Bedtime   levETIRAcetam (KEPPRA XR) 500 MG 24 hr tablet Take 1 tablet (500 mg total) by mouth at bedtime. 90 tablet 3 05/19/2023 Bedtime   losartan (COZAAR) 25 MG tablet Take 25 mg by mouth daily.   05/20/2023 Morning   Multiple Vitamins-Minerals (EYE VITAMINS PO) Take 1 tablet by mouth 2 (two) times daily.   05/20/2023 Morning   nitroGLYCERIN (NITROSTAT) 0.4 MG SL tablet Place 1 tablet (0.4 mg total) under the tongue every 5 (five) minutes as needed for chest pain. 25 tablet 3 Past Month   omeprazole (PRILOSEC) 20 MG capsule Take 1 capsule by mouth daily.   05/20/2023 Morning   Rivaroxaban (XARELTO) 15 MG TABS tablet Take 1 tablet (15 mg total) by mouth daily with supper. (Patient taking differently: Take 15 mg by mouth at bedtime.) 30 tablet 6 05/19/2023 Bedtime   rosuvastatin (CRESTOR) 40 MG tablet Take 1 tablet (40 mg total) by mouth daily. NEEDS FOLLOW UP APPOINTMENT FOR MORE REFILLS (Patient taking differently: Take 40 mg by mouth at bedtime.) 90 tablet 0 05/19/2023 Bedtime   sertraline (ZOLOFT) 50 MG tablet Take 50 mg by mouth at bedtime.   05/19/2023 Bedtime   Continuous Glucose Sensor (FREESTYLE LIBRE 3 SENSOR) MISC 1 each by Does not apply route every 14 (fourteen) days. 6 each 3    magnesium oxide (MAG-OX) 400 (240 Mg) MG tablet TAKE 1 TABLET BY MOUTH TWICE A DAY (Patient not taking: Reported on 05/20/2023) 180 tablet 1 Not Taking   Scheduled:   aspirin  81 mg Per Tube Daily   busPIRone  5 mg Per Tube BID   Chlorhexidine Gluconate Cloth  6 each Topical Daily   gabapentin  200 mg Per Tube Q12H   insulin aspart  0-15 Units Subcutaneous Q4H   insulin glargine  10 Units Subcutaneous Daily   ipratropium-albuterol  3 mL Nebulization Q6H   levETIRAcetam  250 mg Per Tube BID   methylPREDNISolone (SOLU-MEDROL) injection  40 mg Intravenous Q12H   mouth rinse   15 mL Mouth Rinse Q2H   polyethylene glycol  17 g Per Tube BID   sennosides  5 mL Per Tube BID   sertraline  50  mg Per Tube QHS    Assessment: 78 yo female here with VT and ICD shock noted with, VDRF and AKI. 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 Pharmacy consulted to dose heparin.   aPTT is therapeutic at 81, on 1000 units/hr. Hgb 10.5, plt 119. No s/sx of bleeding or infusion issues. Running peripherally and drawn from A-line.  Goal of Therapy:  Heparin level 0.3-0.7 units/ml aPTT 66-102 seconds Monitor platelets by anticoagulation protocol: Yes   Plan:  Continue IV heparin infusion at 1000 units/hr. Check aPTT in 8 hrs with AM labs  Daily heparin level, aPTT and CBC.  Thank you for allowing pharmacy to participate in this patient's care,  Sherron Monday, PharmD, BCCCP Clinical Pharmacist  Phone: [phone] 05/24/2023 3:18 PM  Please check AMION for all [facility] Pharm[facility] phone numbers After 10:00 PM, call Main Pharmacy [phone]

## 2023-05-24 NOTE — Procedures (Signed)
 Extubation Procedure Note  Patient Details:   Name: [name] DOB: May 05, 1945 MRN: [id number]   Airway Documentation:    Vent end date: 05/24/23 Vent end time: 1347   Evaluation  O2 sats: stable throughout Complications: No apparent complications Patient did tolerate procedure well. Bilateral Breath Sounds: Diminished, Coarse crackles   Yes,  Per CCM order, RT extubated pt to [name]. Prior to extubation pt did have a positive cuff leak. Pt tolerated well with SVS, no stridor noted and pt was able to state her name.  [name] 05/24/2023, 1:47 PM

## 2023-05-25 ENCOUNTER — Inpatient Hospital Stay (HOSPITAL_COMMUNITY)

## 2023-05-25 DIAGNOSIS — N1832 Chronic kidney disease, stage 3b: Secondary | ICD-10-CM | POA: Diagnosis not present

## 2023-05-25 DIAGNOSIS — D638 Anemia in other chronic diseases classified elsewhere: Secondary | ICD-10-CM | POA: Diagnosis not present

## 2023-05-25 DIAGNOSIS — I5042 Chronic combined systolic (congestive) and diastolic (congestive) heart failure: Secondary | ICD-10-CM | POA: Diagnosis not present

## 2023-05-25 DIAGNOSIS — J9602 Acute respiratory failure with hypercapnia: Secondary | ICD-10-CM | POA: Diagnosis not present

## 2023-05-25 DIAGNOSIS — I771 Stricture of artery: Secondary | ICD-10-CM | POA: Diagnosis not present

## 2023-05-25 DIAGNOSIS — J9601 Acute respiratory failure with hypoxia: Secondary | ICD-10-CM | POA: Diagnosis not present

## 2023-05-25 DIAGNOSIS — A419 Sepsis, unspecified organism: Secondary | ICD-10-CM | POA: Diagnosis not present

## 2023-05-25 DIAGNOSIS — I4901 Ventricular fibrillation: Secondary | ICD-10-CM | POA: Diagnosis not present

## 2023-05-25 DIAGNOSIS — R0902 Hypoxemia: Secondary | ICD-10-CM | POA: Diagnosis not present

## 2023-05-25 DIAGNOSIS — R918 Other nonspecific abnormal finding of lung field: Secondary | ICD-10-CM | POA: Diagnosis not present

## 2023-05-25 DIAGNOSIS — E1165 Type 2 diabetes mellitus with hyperglycemia: Secondary | ICD-10-CM | POA: Diagnosis not present

## 2023-05-25 DIAGNOSIS — Z4682 Encounter for fitting and adjustment of non-vascular catheter: Secondary | ICD-10-CM | POA: Diagnosis not present

## 2023-05-25 DIAGNOSIS — J101 Influenza due to other identified influenza virus with other respiratory manifestations: Secondary | ICD-10-CM | POA: Diagnosis not present

## 2023-05-25 LAB — GLUCOSE, CAPILLARY
Glucose-Capillary: 143 mg/dL — ABNORMAL HIGH (ref 70–99)
Glucose-Capillary: 108 mg/dL — ABNORMAL HIGH (ref 70–99)
Glucose-Capillary: 123 mg/dL — ABNORMAL HIGH (ref 70–99)
Glucose-Capillary: 102 mg/dL — ABNORMAL HIGH (ref 70–99)
Glucose-Capillary: 122 mg/dL — ABNORMAL HIGH (ref 70–99)
Glucose-Capillary: 91 mg/dL (ref 70–99)
Glucose-Capillary: 114 mg/dL — ABNORMAL HIGH (ref 70–99)

## 2023-05-25 LAB — CULTURE, BLOOD (ROUTINE X 2)
Culture: NO GROWTH
Culture: NO GROWTH
Special Requests: ADEQUATE

## 2023-05-25 LAB — POCT I-STAT 7, (LYTES, BLD GAS, ICA,H+H)
Acid-base deficit: 4 mmol/L — ABNORMAL HIGH (ref 0.0–2.0)
Bicarbonate: 20.5 mmol/L (ref 20.0–28.0)
Calcium, Ion: 1.18 mmol/L (ref 1.15–1.40)
HCT: 35 % — ABNORMAL LOW (ref 36.0–46.0)
Hemoglobin: 11.9 g/dL — ABNORMAL LOW (ref 12.0–15.0)
O2 Saturation: 93 %
Patient temperature: 36.5
Potassium: 3.7 mmol/L (ref 3.5–5.1)
Sodium: 142 mmol/L (ref 135–145)
TCO2: 22 mmol/L (ref 22–32)
pCO2 arterial: 32.9 mmHg (ref 32–48)
pH, Arterial: 7.401 (ref 7.35–7.45)
pO2, Arterial: 66 mmHg — ABNORMAL LOW (ref 83–108)

## 2023-05-25 LAB — CBC
HCT: 32.2 % — ABNORMAL LOW (ref 36.0–46.0)
Hemoglobin: 10.9 g/dL — ABNORMAL LOW (ref 12.0–15.0)
MCH: 28.8 pg (ref 26.0–34.0)
MCHC: 33.9 g/dL (ref 30.0–36.0)
MCV: 85.2 fL (ref 80.0–100.0)
Platelets: 116 10*3/uL — ABNORMAL LOW (ref 150–400)
RBC: 3.78 MIL/uL — ABNORMAL LOW (ref 3.87–5.11)
RDW: 19 % — ABNORMAL HIGH (ref 11.5–15.5)
WBC: 12.9 10*3/uL — ABNORMAL HIGH (ref 4.0–10.5)
nRBC: 0.6 % — ABNORMAL HIGH (ref 0.0–0.2)

## 2023-05-25 LAB — BASIC METABOLIC PANEL
Anion gap: 14 (ref 5–15)
BUN: 67 mg/dL — ABNORMAL HIGH (ref 8–23)
CO2: 21 mmol/L — ABNORMAL LOW (ref 22–32)
Calcium: 8.5 mg/dL — ABNORMAL LOW (ref 8.9–10.3)
Chloride: 106 mmol/L (ref 98–111)
Creatinine, Ser: 2.4 mg/dL — ABNORMAL HIGH (ref 0.44–1.00)
GFR, Estimated: 20 mL/min — ABNORMAL LOW (ref >60)
Glucose, Bld: 125 mg/dL — ABNORMAL HIGH (ref 70–99)
Potassium: 3.5 mmol/L (ref 3.5–5.1)
Sodium: 141 mmol/L (ref 135–145)

## 2023-05-25 LAB — COMPREHENSIVE METABOLIC PANEL
ALT: 273 U/L — ABNORMAL HIGH (ref 0–44)
AST: 80 U/L — ABNORMAL HIGH (ref 15–41)
Albumin: 2 g/dL — ABNORMAL LOW (ref 3.5–5.0)
Alkaline Phosphatase: 72 U/L (ref 38–126)
Anion gap: 14 (ref 5–15)
BUN: 63 mg/dL — ABNORMAL HIGH (ref 8–23)
CO2: 19 mmol/L — ABNORMAL LOW (ref 22–32)
Calcium: 8.5 mg/dL — ABNORMAL LOW (ref 8.9–10.3)
Chloride: 106 mmol/L (ref 98–111)
Creatinine, Ser: 2.52 mg/dL — ABNORMAL HIGH (ref 0.44–1.00)
GFR, Estimated: 19 mL/min — ABNORMAL LOW (ref >60)
Glucose, Bld: 147 mg/dL — ABNORMAL HIGH (ref 70–99)
Potassium: 3.2 mmol/L — ABNORMAL LOW (ref 3.5–5.1)
Sodium: 139 mmol/L (ref 135–145)
Total Bilirubin: 1.5 mg/dL — ABNORMAL HIGH (ref 0.0–1.2)
Total Protein: 5.8 g/dL — ABNORMAL LOW (ref 6.5–8.1)

## 2023-05-25 LAB — PHOSPHORUS: Phosphorus: 3.3 mg/dL (ref 2.5–4.6)

## 2023-05-25 LAB — APTT: aPTT: 72 s — ABNORMAL HIGH (ref 24–36)

## 2023-05-25 LAB — HEPARIN LEVEL (UNFRACTIONATED): Heparin Unfractionated: 0.47 [IU]/mL (ref 0.30–0.70)

## 2023-05-25 LAB — MAGNESIUM: Magnesium: 2.2 mg/dL (ref 1.7–2.4)

## 2023-05-25 MED ORDER — HYDRALAZINE HCL 20 MG/ML IJ SOLN
10.0000 mg | Freq: Four times a day (QID) | INTRAMUSCULAR | Status: DC | PRN
  Administered 2023-05-25 – 2023-05-27 (×5): 10 mg via INTRAVENOUS
  Filled 2023-05-25 (×4): qty 1

## 2023-05-25 MED ORDER — FUROSEMIDE 10 MG/ML IJ SOLN
60.0000 mg | Freq: Once | INTRAMUSCULAR | Status: AC
Start: 2023-05-25 — End: 2023-05-25
  Administered 2023-05-25: 60 mg via INTRAVENOUS
  Filled 2023-05-25: qty 6

## 2023-05-25 MED ORDER — FAMOTIDINE 20 MG PO TABS
20.0000 mg | ORAL_TABLET | Freq: Every day | ORAL | Status: DC
  Administered 2023-05-27 – 2023-05-29 (×3): 20 mg
  Filled 2023-05-25 (×3): qty 1

## 2023-05-25 MED ORDER — SORBITOL 70 % SOLN: 30.0000 mL | Freq: Once | Status: DC

## 2023-05-25 MED ORDER — FUROSEMIDE 10 MG/ML IJ SOLN
60.0000 mg | Freq: Once | INTRAMUSCULAR | Status: AC
  Administered 2023-05-25: 60 mg via INTRAVENOUS
  Filled 2023-05-25: qty 6

## 2023-05-25 MED ORDER — POTASSIUM CHLORIDE 10 MEQ/50ML IV SOLN
10.0000 meq | INTRAVENOUS | Status: AC
  Administered 2023-05-25 (×4): 10 meq via INTRAVENOUS
  Filled 2023-05-25 (×4): qty 50

## 2023-05-25 MED ORDER — SENNOSIDES 8.8 MG/5ML PO SYRP
10.0000 mL | ORAL_SOLUTION | Freq: Two times a day (BID) | ORAL | Status: DC
  Administered 2023-05-26 – 2023-06-02 (×10): 10 mL
  Filled 2023-05-25 (×11): qty 10

## 2023-05-25 NOTE — Progress Notes (Signed)
 NAME:  [name], MRN:  [id number], DOB:  1945-10-10, LOS: 4 ADMISSION DATE:  05/20/2023, CONSULTATION DATE:  05/20/2023 REFERRING MD:  Alinda Money - [facility] CHIEF COMPLAINT:  N/V/Abd pain / Resp failure   History of Present Illness:  78 year old female with medical history significant of hypertension, hyperlipidemia, diabetes, neuropathy, hypothyroidism, CKD 3B, CAD, GERD, CVA, chronic systolic CHF, depression, anxiety, ventricular tachycardia, ventricular fibrillation, atrial fibrillation, status post ICD, obesity, seizure disorder, low back pain, spinal stenosis presenting after feeling unwell for several days and recent ICD discharges.   Patient reports that she has been unwell for possible days.  She is experience nausea vomiting and abdominal pain.  She reports that her defibrillator fired multiple times today.  ED provider confirmed this with the [facility] representative who noted that the device did fire 3 times a day for ventricular fibrillation.  Also noted periods of slow ventricular tachycardia as well as atrial fibrillation.   Patient denies fevers, chills.   ED Course: Vital signs in the ED notable for blood pressure initially in the 70 systolic now improved to the 120s.  Respiratory rate in the 20s.  Requiring 3 L to maintain saturations.  Lab workup included CMP with potassium 3.2, bicarb 18, gap 17, BUN 32, creatinine elevated 2.37 baseline of 2, glucose 2 1, calcium 7.9, protein 6.0, albumin 2.5, T. bili 1.9, AST 1076, ALT 663.  CBC with platelets 142.  BNP elevated to 2548.  Troponin trend 339, 417.  Lipase normal.  Respiratory panel positive for flu A.  Lactic acid pending.  Urinalysis pending.  Blood cultures pending.  VBG with normal pH pCO2 40.7.  Magnesium pending.  Chest x-ray with new bilateral opacities consistent with pneumonia.  CT of the abdomen pelvis showing no acute normality in the abdomen pelvis, bilateral opacity at the lung bases, left renal cysts.  Patient received  ceftriaxone, doxycycline, Tamiflu in the ED.  Started on amiodarone infusion.  Also received morphine, 1 L IV fluids and 40 mEq p.o. potassium x 2.  Cardiology consulted and recommended amiodarone fusion, repletion of magnesium and potassium as needed, adding back beta-blocker as blood pressure allows.  Pertinent Medical History:   has a past medical history of Acute kidney injury superimposed on chronic kidney disease (HCC) (10/02/2022), Allergy, Arthritis, Atrial fibrillation (HCC), Chronic renal insufficiency, Chronic systolic CHF (congestive heart failure) (HCC) (03/20/2013), Chronic systolic heart failure (HCC), Coronary artery disease (01/31/2011), Diabetes mellitus, 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, History of chicken pox, Hyperlipidemia, Hypertension, Ischemic cardiomyopathy, Knee MCL sprain (03/28/2014), Left knee pain (08/30/2012), Lumbar spondylosis (01/11/2012), Pes anserine bursitis (02/23/2014), Sleep apnea, Stroke (HCC) (2010), Suicide attempt by benzodiazepine overdose (HCC) (01/21/2020), UTI (urinary tract infection) (08/03/2013), and Ventricular tachycardia (HCC).   Significant Hospital Events: Including procedures, antibiotic start and stop dates in addition to other pertinent events   2/27 Presented to ED feeling unwell, s/p AICD discharge x 3 for VT/slow VT. Hypotensive with increased WOB prompting intubation. Flu A+,  CXR c/w PNA. 2/28 Remains on high-dose vasopressor support with Levophed. No more episodes of V. Tach. Remain acidotic mixed metabolic and respiratory. 3/1 Remains on ventilator with FiO2 50 PEEP 8, remains on both levo and vaso  Interim History / Subjective:  No significant events overnight Extubated 3/3 Required 11L [place] at present, some tachypnea/moderate WOB (patient is a mouth breather) prompting BiPAP reinitiation CXR stable to slightly worse? ABG 7.40/32.9/66/20.5 Cr 2.52, remains elevated; attempt Lasix 60mg  IV x 1 Mental status remains  poor, only  opens eyes to voice, occasional mumbling  Objective:  Blood pressure (!) 156/100, pulse 64, temperature 97.7 F (36.5 C), temperature source Bladder, resp. rate (!) 30, height 5\' 2"  (1.575 m), weight 92 kg, SpO2 93%.    Vent Mode: PSV;CPAP FiO2 (%):  [40 %] 40 % PEEP:  [8 cmH20] 8 cmH20 Pressure Support:  [8 cmH20] 8 cmH20   Intake/Output Summary (Last 24 hours) at 05/25/2023 0934 Last data filed at 05/25/2023 1610 Gross per 24 hour  Intake 2508.89 ml  Output 806 ml  Net 1702.89 ml   Filed Weights   05/22/23 0500 05/23/23 0500 05/24/23 0500  Weight: 86.7 kg 87.7 kg 92 kg   Physical Examination: General: Acutely ill-appearing older woman in NAD. Appears uncomfortable, dazed. HEENT: [place]/AT, anicteric sclera, PERRL 3mm sluggish, moist mucous membranes. BiPAP in place. Neuro:  Lethargic. Opens eyes intermittently to voice.  Does not respond reliably to verbal, tactile or noxious stimuli/pain. Not following commands. Moves BUE spontaneously. CV: RRR, no m/g/r. PULM: Breathing tachypneic and mild-moderately labored on BiPAP. ?Paradoxical breathing. Lung fields rhonchorous throughout. GI: Soft, nontender, nondistended. Normoactive bowel sounds. Extremities: Bilateral symmetric 1+ nonpitting LE/UE edema noted. Skin: Warm/dry, no rashes.  Resolved Hospital Problem List:    Assessment & Plan:  Acute respiratory failure with hypoxia and hypercapnia Influenza pneumonia Bilateral superimposed bacterial pneumonia - Supplemental O2 support for sat > 90% - BiPAP continuous for now, then PRN + at bedtime - Solumedrol as ordered - Tamiflu, droplet precautions - Ceftriaxone/doxycyline x 7-day course (end 3/5) - Pulmonary hygiene - Consider Resp Cx, WBC increasing - High risk for reintubation  Severe sepsis with septic shock, POA (improved) Leukocytosis - Goal MAP > 65 - Fluid resuscitation as tolerated, caution in the setting of CHF/CKD - Trend WBC, fever curve; WBC up to 12.9  from 6 (3/3-3/4) - F/u Cx data - Continue broad-spectrum antibiotics (ceftriaxone/doxy) as above  Acute septic encephalopathy - improving Patient is awake, intermittently following commands, generalized weakness. - Following commands intermittently post-extubation - At times slow to respond, will only state name - pO2 66, query if this is contributing; pCO2 WNL - Correct metabolic derangements - Limit sedating medications in the setting of CKD  Ventricular tachycardia likely in the setting of acute illness Paroxysmal A-fib/A-flutter Patient takes amiodarone and Xarelto at home. AICD in place. 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 - Resume GDMT as tolerated, shock resolved - Continue ASA  AKI on CKD stage IIIb -slightly improved Mixed respiratory and metabolic acidosis Hypokalemia Creatinine on admission 2.60 with GFR 21 compared to creatinine 3.43 with GFR 13 2/28. Serum creatinine continue to improve, down to 2.57. - Trend BMP - Replete electrolytes as indicated - Monitor I&Os, Lasix challenge 60mg  IV x 1 today 3/4 - Avoid nephrotoxic agents as able - Ensure adequate renal perfusion  Shock liver, improved LFTs continue to downtrend. - LFTs continue to normalize - Trend intermittently  Diabetes type 2, with hyperglycemia - SSI - CBGs Q4H - Goal CBG 140-180  Anemia and thrombocytopenia of critical illness - Trend H&H, Plt - Monitor for signs of active bleeding - Transfuse for Hgb < 7.0, Plt < 20K or hemodynamically significant bleeding  Best Practice (right click and "Reselect all  SmartList Selections" daily)   Diet/type: NPO DVT prophylaxis IV heparin infusion Pressure ulcer(s): N/A GI prophylaxis: H2B Lines: right internal jugular CVL - 05/20/23 Foley: Yes needed Code Status:  full code Last date of multidisciplinary goals of care discussion: 3/2: Patient's sister was updated over the phone, granddaughter at bedside  Critical care time:   The patient is critically ill with multiple organ system failure and requires high complexity decision making for assessment and support, frequent evaluation and titration of therapies, advanced monitoring, review of radiographic studies and interpretation of complex data.   Critical Care Time devoted to patient care services, exclusive of separately billable procedures, described in this note is 37 minutes.  Tim Lair, PA-C [facility] 05/25/23 9:34 AM  Please see [email or web address] for pager details.  From 7A-7P if no response, please call [phone] After hours, please call ELink [phone]

## 2023-05-25 NOTE — Progress Notes (Signed)
 Telemetry with rate controlled Afib, wide complex bets, these are ventricular, 60's-80's bpm No fast or unstable VT Acute hypoxic respiratory failure with Influenza Extubated yesterday >> HFO2 at 10L Continue amiodarone gtt until respiratory status stabilizes  AFib known for her as well on heparin gtt  Continue management with CCM team We will continue to follow from afar   Long VT hx: Device information SJM dual chamber ICD, implanted 2003,  2007, gen changes 2013, Jan 2022 At the time of her gen change Jan 2022,  SJM Riata ST 7040 lead was fluoroscopically and electrically normal today, opted to use this lead rather than replacing it after a long discussion with the patient prior to the procedure.     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 Retried mexiletine, low dose (reportedly previously intolerant) >> again stopped, unable to tolerate (gait instability and vision changes)   Francis Dowse, PA-C

## 2023-05-25 NOTE — Progress Notes (Signed)
 PT Cancellation Note  Patient Details Name: [name] MRN: [id number] DOB: September 14, 1945   Cancelled Treatment:    Reason Eval/Treat Not Completed: Patient not medically ready. Pt being placed on biPap and not appropriate for PT eval. Acute PT to return as able, as appropriate to complete PT eval.   Lewis Shock, PT, DPT Acute Rehabilitation Services Secure chat preferred Office #: [phone]    [name] 05/25/2023, 10:29 AM

## 2023-05-25 NOTE — Progress Notes (Signed)
 PHARMACY - ANTICOAGULATION CONSULT NOTE  Pharmacy Consult for heparin  Indication: atrial fibrillation  Allergies  Allergen Reactions   Veltassa [Patiromer] Diarrhea   Aldactone [Spironolactone] Diarrhea and Nausea And Vomiting   Darvon [Propoxyphene] Other (See Comments)    Indigestion    Mexitil [Mexiletine] Nausea And Vomiting and Other (See Comments)    Tremors Difficulty walking Blurred vision   Phenergan [Promethazine Hcl] Other (See Comments)    Hyperactivity    Ranexa [Ranolazine Er] Diarrhea   Plavix [Clopidogrel Bisulfate] Rash    Patient Measurements: Height: 5\' 2"  (157.5 cm) Weight: 94.7 kg (208 lb 12.4 oz) IBW/kg (Calculated) : 50.1 HEPARIN DW (KG): 68.6   Vital Signs: Temp: 97.5 F (36.4 C) (03/04 1200) Temp Source: Bladder (03/04 1200) BP: 168/58 (03/04 1253) Pulse Rate: 60 (03/04 1300)  Labs: Recent Labs    05/23/23 0207 05/23/23 1118 05/23/23 2121 05/24/23 0405 05/24/23 0515 05/24/23 1244 05/24/23 1308 05/25/23 0400 05/25/23 0742  HGB 10.5*  --   --  10.5*  --  11.6*  --  10.9* 11.9*  HCT 32.0*  --   --  30.9*  --  34.0*  --  32.2* 35.0*  PLT 144*  --   --  119*  --   --   --  116*  --   APTT 106*   < > 35  --  61*  --  81* 72*  --   HEPARINUNFRC 1.00*  --   --   --  0.53  --   --  0.47  --   CREATININE 2.80*  --  2.54*  --  2.57*  --   --  2.52*  --    < > = values in this interval not displayed.    Estimated Creatinine Clearance: 20.4 mL/min (A) (by C-G formula based on SCr of 2.52 mg/dL (H)).   Medical History: Past Medical History:  Diagnosis Date   Acute kidney injury superimposed on chronic kidney disease (HCC) 10/02/2022   Allergy    Arthritis    Atrial fibrillation (HCC)    controlled with amiodarone, on coumadin   Chronic renal insufficiency    Chronic systolic CHF (congestive heart failure) (HCC) 03/20/2013   Chronic systolic heart failure (HCC)    Coronary artery disease 01/31/2011   Diabetes mellitus    type 1    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    History of chicken pox    Hyperlipidemia    Hypertension    Ischemic cardiomyopathy    Knee MCL sprain 03/28/2014   Left knee pain 08/30/2012   Lumbar spondylosis 01/11/2012   Pes anserine bursitis 02/23/2014   Sleep apnea    Stroke (HCC) 2010   eye doctor said she had TIA   Suicide attempt by benzodiazepine overdose (HCC) 01/21/2020   UTI (urinary tract infection) 08/03/2013   Ventricular tachycardia (HCC)    Polymorphic    Medications:  Medications Prior to Admission  Medication Sig Dispense Refill Last Dose/Taking   amiodarone (PACERONE) 200 MG tablet Take 1 tablet (200 mg total) by mouth daily. 30 tablet 6 05/20/2023 Morning   aspirin EC 81 MG tablet Take 1 tablet (81 mg total) by mouth daily. Swallow whole. 30 tablet 12 05/20/2023 Morning   busPIRone (BUSPAR) 5 MG tablet Take 1 tablet (5 mg total) by mouth 2 (two) times daily. 60 tablet 1 05/20/2023 Morning   carvedilol (COREG) 25 MG tablet TAKE 1 TABLET (25 MG TOTAL) BY MOUTH TWICE A DAY WITH  MEALS 180 tablet 3 05/20/2023 Morning   Cholecalciferol (VITAMIN D-3 PO) Take 1 tablet by mouth daily.   05/20/2023 Morning   dicyclomine (BENTYL) 10 MG capsule TAKE 1 CAPSULE (10 MG TOTAL) BY MOUTH 4 (FOUR) TIMES DAILY BEFORE MEALS AND AT BEDTIME. 120 capsule 1 05/20/2023 Morning   furosemide (LASIX) 20 MG tablet Take 2 tablets (40 mg) by mouth once a day. 60 tablet 11 05/20/2023 Morning   gabapentin (NEURONTIN) 100 MG capsule Take 200 mg by mouth 2 (two) times daily.   05/20/2023 Morning   insulin aspart (NOVOLOG FLEXPEN) 100 UNIT/ML FlexPen Inject up to 15 units daily under skin as advised (Patient taking differently: Inject 4-5 Units into the skin 3 (three) times daily before meals. Inject up to 15 units daily under skin as advised) 15 mL 11 Past Week   insulin degludec (TRESIBA FLEXTOUCH) 100 UNIT/ML FlexTouch Pen Inject 12-14 Units into the skin daily. (Patient taking differently: Inject 14  Units into the skin at bedtime.) 9 mL 3 05/19/2023 Bedtime   levETIRAcetam (KEPPRA XR) 500 MG 24 hr tablet Take 1 tablet (500 mg total) by mouth at bedtime. 90 tablet 3 05/19/2023 Bedtime   losartan (COZAAR) 25 MG tablet Take 25 mg by mouth daily.   05/20/2023 Morning   Multiple Vitamins-Minerals (EYE VITAMINS PO) Take 1 tablet by mouth 2 (two) times daily.   05/20/2023 Morning   nitroGLYCERIN (NITROSTAT) 0.4 MG SL tablet Place 1 tablet (0.4 mg total) under the tongue every 5 (five) minutes as needed for chest pain. 25 tablet 3 Past Month   omeprazole (PRILOSEC) 20 MG capsule Take 1 capsule by mouth daily.   05/20/2023 Morning   Rivaroxaban (XARELTO) 15 MG TABS tablet Take 1 tablet (15 mg total) by mouth daily with supper. (Patient taking differently: Take 15 mg by mouth at bedtime.) 30 tablet 6 05/19/2023 Bedtime   rosuvastatin (CRESTOR) 40 MG tablet Take 1 tablet (40 mg total) by mouth daily. NEEDS FOLLOW UP APPOINTMENT FOR MORE REFILLS (Patient taking differently: Take 40 mg by mouth at bedtime.) 90 tablet 0 05/19/2023 Bedtime   sertraline (ZOLOFT) 50 MG tablet Take 50 mg by mouth at bedtime.   05/19/2023 Bedtime   Continuous Glucose Sensor (FREESTYLE LIBRE 3 SENSOR) MISC 1 each by Does not apply route every 14 (fourteen) days. 6 each 3    magnesium oxide (MAG-OX) 400 (240 Mg) MG tablet TAKE 1 TABLET BY MOUTH TWICE A DAY (Patient not taking: Reported on 05/20/2023) 180 tablet 1 Not Taking   Scheduled:   aspirin  81 mg Per Tube Daily   busPIRone  5 mg Per Tube BID   Chlorhexidine Gluconate Cloth  6 each Topical Daily   gabapentin  200 mg Per Tube Q12H   insulin aspart  0-15 Units Subcutaneous Q4H   insulin glargine  10 Units Subcutaneous Daily   ipratropium-albuterol  3 mL Nebulization Q6H   methylPREDNISolone (SOLU-MEDROL) injection  40 mg Intravenous Q12H   polyethylene glycol  17 g Per Tube BID   sennosides  5 mL Per Tube BID   sertraline  50 mg Per Tube QHS    Assessment: 78 yo female here  with VT and ICD shock noted with, VDRF and AKI. 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 Pharmacy consulted to dose heparin.   Heparin drip 1000 units/hr with heparin level 0.47 and aptt 72sec at goal. Hgb 10-11  stable plt 115 stable. No s/sx of bleeding or infusion issues. Running peripherally and drawn from A-line.  Goal of Therapy:  Heparin level 0.3-0.7 units/ml aPTT 66-102 seconds Monitor platelets by anticoagulation protocol: Yes   Plan:  Continue IV heparin infusion at 1000 units/hr. Daily heparin level, and CBC.    Leota Sauers Pharm.D. CPP, BCPS Clinical Pharmacist [phone] 05/25/2023 2:14 PM    Please check AMION for all [facility] Pharmacy phone numbers After 10:00 PM, call Main Pharmacy [phone]

## 2023-05-25 NOTE — Progress Notes (Signed)
 Per CCM, RT placed pt on BiPAP (servo). Pt tolerating well at this time. RN of pt notified. RT will continue to monitor pt.

## 2023-05-25 NOTE — Telephone Encounter (Signed)
 Sent another mychart to see if she has received the 2nd mailed application

## 2023-05-26 ENCOUNTER — Inpatient Hospital Stay (HOSPITAL_COMMUNITY)

## 2023-05-26 DIAGNOSIS — R918 Other nonspecific abnormal finding of lung field: Secondary | ICD-10-CM | POA: Diagnosis not present

## 2023-05-26 DIAGNOSIS — J101 Influenza due to other identified influenza virus with other respiratory manifestations: Secondary | ICD-10-CM | POA: Diagnosis not present

## 2023-05-26 DIAGNOSIS — I5042 Chronic combined systolic (congestive) and diastolic (congestive) heart failure: Secondary | ICD-10-CM | POA: Diagnosis not present

## 2023-05-26 DIAGNOSIS — I4901 Ventricular fibrillation: Secondary | ICD-10-CM | POA: Diagnosis not present

## 2023-05-26 DIAGNOSIS — Z452 Encounter for adjustment and management of vascular access device: Secondary | ICD-10-CM | POA: Diagnosis not present

## 2023-05-26 DIAGNOSIS — J9601 Acute respiratory failure with hypoxia: Secondary | ICD-10-CM | POA: Diagnosis not present

## 2023-05-26 DIAGNOSIS — N1832 Chronic kidney disease, stage 3b: Secondary | ICD-10-CM | POA: Diagnosis not present

## 2023-05-26 DIAGNOSIS — E119 Type 2 diabetes mellitus without complications: Secondary | ICD-10-CM | POA: Diagnosis not present

## 2023-05-26 DIAGNOSIS — A419 Sepsis, unspecified organism: Secondary | ICD-10-CM | POA: Diagnosis not present

## 2023-05-26 DIAGNOSIS — N179 Acute kidney failure, unspecified: Secondary | ICD-10-CM | POA: Diagnosis not present

## 2023-05-26 DIAGNOSIS — J9602 Acute respiratory failure with hypercapnia: Secondary | ICD-10-CM | POA: Diagnosis not present

## 2023-05-26 DIAGNOSIS — J9690 Respiratory failure, unspecified, unspecified whether with hypoxia or hypercapnia: Secondary | ICD-10-CM | POA: Diagnosis not present

## 2023-05-26 LAB — POCT I-STAT 7, (LYTES, BLD GAS, ICA,H+H)
Acid-base deficit: 3 mmol/L — ABNORMAL HIGH (ref 0.0–2.0)
Acid-base deficit: 4 mmol/L — ABNORMAL HIGH (ref 0.0–2.0)
Bicarbonate: 20.2 mmol/L (ref 20.0–28.0)
Bicarbonate: 19.1 mmol/L — ABNORMAL LOW (ref 20.0–28.0)
Calcium, Ion: 1.14 mmol/L — ABNORMAL LOW (ref 1.15–1.40)
Calcium, Ion: 1.19 mmol/L (ref 1.15–1.40)
HCT: 36 % (ref 36.0–46.0)
HCT: 35 % — ABNORMAL LOW (ref 36.0–46.0)
Hemoglobin: 12.2 g/dL (ref 12.0–15.0)
Hemoglobin: 11.9 g/dL — ABNORMAL LOW (ref 12.0–15.0)
O2 Saturation: 92 %
O2 Saturation: 96 %
Patient temperature: 36.6
Patient temperature: 97.2
Potassium: 3.5 mmol/L (ref 3.5–5.1)
Potassium: 3.8 mmol/L (ref 3.5–5.1)
Sodium: 144 mmol/L (ref 135–145)
Sodium: 144 mmol/L (ref 135–145)
TCO2: 21 mmol/L — ABNORMAL LOW (ref 22–32)
TCO2: 20 mmol/L — ABNORMAL LOW (ref 22–32)
pCO2 arterial: 27.9 mmHg — ABNORMAL LOW (ref 32–48)
pCO2 arterial: 28.7 mmHg — ABNORMAL LOW (ref 32–48)
pH, Arterial: 7.467 — ABNORMAL HIGH (ref 7.35–7.45)
pH, Arterial: 7.429 (ref 7.35–7.45)
pO2, Arterial: 58 mmHg — ABNORMAL LOW (ref 83–108)
pO2, Arterial: 76 mmHg — ABNORMAL LOW (ref 83–108)

## 2023-05-26 LAB — LACTIC ACID, PLASMA: Lactic Acid, Venous: 1.7 mmol/L (ref 0.5–1.9)

## 2023-05-26 LAB — BASIC METABOLIC PANEL
Anion gap: 16 — ABNORMAL HIGH (ref 5–15)
BUN: 66 mg/dL — ABNORMAL HIGH (ref 8–23)
CO2: 17 mmol/L — ABNORMAL LOW (ref 22–32)
Calcium: 8.4 mg/dL — ABNORMAL LOW (ref 8.9–10.3)
Chloride: 108 mmol/L (ref 98–111)
Creatinine, Ser: 2.4 mg/dL — ABNORMAL HIGH (ref 0.44–1.00)
GFR, Estimated: 20 mL/min — ABNORMAL LOW (ref >60)
Glucose, Bld: 134 mg/dL — ABNORMAL HIGH (ref 70–99)
Potassium: 3.3 mmol/L — ABNORMAL LOW (ref 3.5–5.1)
Sodium: 141 mmol/L (ref 135–145)

## 2023-05-26 LAB — GLUCOSE, CAPILLARY
Glucose-Capillary: 108 mg/dL — ABNORMAL HIGH (ref 70–99)
Glucose-Capillary: 134 mg/dL — ABNORMAL HIGH (ref 70–99)
Glucose-Capillary: 139 mg/dL — ABNORMAL HIGH (ref 70–99)
Glucose-Capillary: 172 mg/dL — ABNORMAL HIGH (ref 70–99)
Glucose-Capillary: 111 mg/dL — ABNORMAL HIGH (ref 70–99)
Glucose-Capillary: 184 mg/dL — ABNORMAL HIGH (ref 70–99)

## 2023-05-26 LAB — CBC
HCT: 30.7 % — ABNORMAL LOW (ref 36.0–46.0)
Hemoglobin: 10.7 g/dL — ABNORMAL LOW (ref 12.0–15.0)
MCH: 28.3 pg (ref 26.0–34.0)
MCHC: 34.9 g/dL (ref 30.0–36.0)
MCV: 81.2 fL (ref 80.0–100.0)
Platelets: 100 10*3/uL — ABNORMAL LOW (ref 150–400)
RBC: 3.78 MIL/uL — ABNORMAL LOW (ref 3.87–5.11)
RDW: 19.1 % — ABNORMAL HIGH (ref 11.5–15.5)
WBC: 16.4 10*3/uL — ABNORMAL HIGH (ref 4.0–10.5)
nRBC: 1.8 % — ABNORMAL HIGH (ref 0.0–0.2)

## 2023-05-26 LAB — HEPARIN LEVEL (UNFRACTIONATED): Heparin Unfractionated: 0.35 [IU]/mL (ref 0.30–0.70)

## 2023-05-26 LAB — MAGNESIUM: Magnesium: 2 mg/dL (ref 1.7–2.4)

## 2023-05-26 MED ORDER — VITAL AF 1.2 CAL PO LIQD: 1000.0000 mL | ORAL | Status: DC

## 2023-05-26 MED ORDER — FUROSEMIDE 10 MG/ML IJ SOLN
120.0000 mg | Freq: Once | INTRAVENOUS | Status: AC
  Administered 2023-05-26: 120 mg via INTRAVENOUS
  Filled 2023-05-26: qty 10

## 2023-05-26 MED ORDER — INSULIN GLARGINE 100 UNIT/ML ~~LOC~~ SOLN
5.0000 [IU] | Freq: Every day | SUBCUTANEOUS | Status: DC
  Administered 2023-05-27: 5 [IU] via SUBCUTANEOUS
  Filled 2023-05-26: qty 0.05

## 2023-05-26 MED ORDER — POTASSIUM CHLORIDE 20 MEQ PO PACK: 40.0000 meq | PACK | Freq: Once | ORAL | Status: DC

## 2023-05-26 MED ORDER — METHYLPREDNISOLONE SODIUM SUCC 40 MG IJ SOLR
40.0000 mg | Freq: Every day | INTRAMUSCULAR | Status: DC
  Administered 2023-05-27: 40 mg via INTRAVENOUS
  Filled 2023-05-26: qty 1

## 2023-05-26 MED ORDER — SODIUM CHLORIDE 0.9 % IV SOLN
2.0000 g | INTRAVENOUS | Status: AC
  Administered 2023-05-26 – 2023-05-31 (×6): 2 g via INTRAVENOUS
  Filled 2023-05-26 (×6): qty 12.5

## 2023-05-26 MED ORDER — THIAMINE MONONITRATE 100 MG PO TABS
100.0000 mg | ORAL_TABLET | Freq: Every day | ORAL | Status: AC
  Administered 2023-05-26 – 2023-06-01 (×7): 100 mg
  Filled 2023-05-26 (×7): qty 1

## 2023-05-26 MED ORDER — POTASSIUM CHLORIDE 10 MEQ/50ML IV SOLN
10.0000 meq | INTRAVENOUS | Status: AC
  Administered 2023-05-26 (×4): 10 meq via INTRAVENOUS
  Filled 2023-05-26 (×4): qty 50

## 2023-05-26 MED ORDER — VANCOMYCIN HCL 1750 MG/350ML IV SOLN
1750.0000 mg | Freq: Once | INTRAVENOUS | Status: AC
  Administered 2023-05-26: 1750 mg via INTRAVENOUS
  Filled 2023-05-26: qty 350

## 2023-05-26 MED ORDER — VITAL AF 1.2 CAL PO LIQD
1000.0000 mL | ORAL | Status: DC
  Administered 2023-05-26 – 2023-06-01 (×7): 1000 mL

## 2023-05-26 MED ORDER — VANCOMYCIN HCL 750 MG/150ML IV SOLN
750.0000 mg | INTRAVENOUS | Status: DC
  Filled 2023-05-26: qty 150

## 2023-05-26 MED ORDER — POTASSIUM CHLORIDE 10 MEQ/50ML IV SOLN
10.0000 meq | Freq: Once | INTRAVENOUS | Status: AC
  Administered 2023-05-26: 10 meq via INTRAVENOUS
  Filled 2023-05-26: qty 50

## 2023-05-26 MED ORDER — POTASSIUM CHLORIDE 10 MEQ/50ML IV SOLN
10.0000 meq | INTRAVENOUS | Status: AC
  Administered 2023-05-26 (×3): 10 meq via INTRAVENOUS
  Filled 2023-05-26 (×3): qty 50

## 2023-05-26 MED ORDER — FUROSEMIDE 10 MG/ML IJ SOLN
120.0000 mg | Freq: Once | INTRAVENOUS | Status: AC
  Administered 2023-05-26: 120 mg via INTRAVENOUS
  Filled 2023-05-26: qty 2

## 2023-05-26 MED ORDER — BISACODYL 10 MG RE SUPP: 10.0000 mg | Freq: Once | RECTAL | Status: DC

## 2023-05-26 NOTE — Progress Notes (Signed)
 Pt removed from bipap and placed on 15L [place] per MD request for placement of [employer].

## 2023-05-26 NOTE — Progress Notes (Addendum)
 NAME:  [name], MRN:  [id number], DOB:  1945-08-23, LOS: 5 ADMISSION DATE:  05/20/2023, CONSULTATION DATE:  05/20/2023 REFERRING MD:  Alinda Money - [facility] CHIEF COMPLAINT:  N/V/Abd pain / Resp failure   History of Present Illness:  78 year old female with medical history significant of hypertension, hyperlipidemia, diabetes, neuropathy, hypothyroidism, CKD 3B, CAD, GERD, CVA, chronic systolic CHF, depression, anxiety, ventricular tachycardia, ventricular fibrillation, atrial fibrillation, status post ICD, obesity, seizure disorder, low back pain, spinal stenosis presenting after feeling unwell for several days and recent ICD discharges.   Patient reports that she has been unwell for possible days.  She is experience nausea vomiting and abdominal pain.  She reports that her defibrillator fired multiple times today.  ED provider confirmed this with the [facility] representative who noted that the device did fire 3 times a day for ventricular fibrillation.  Also noted periods of slow ventricular tachycardia as well as atrial fibrillation.   Patient denies fevers, chills.   ED Course: Vital signs in the ED notable for blood pressure initially in the 70 systolic now improved to the 120s.  Respiratory rate in the 20s.  Requiring 3 L to maintain saturations.  Lab workup included CMP with potassium 3.2, bicarb 18, gap 17, BUN 32, creatinine elevated 2.37 baseline of 2, glucose 2 1, calcium 7.9, protein 6.0, albumin 2.5, T. bili 1.9, AST 1076, ALT 663.  CBC with platelets 142.  BNP elevated to 2548.  Troponin trend 339, 417.  Lipase normal.  Respiratory panel positive for flu A.  Lactic acid pending.  Urinalysis pending.  Blood cultures pending.  VBG with normal pH pCO2 40.7.  Magnesium pending.  Chest x-ray with new bilateral opacities consistent with pneumonia.  CT of the abdomen pelvis showing no acute normality in the abdomen pelvis, bilateral opacity at the lung bases, left renal cysts.  Patient received  ceftriaxone, doxycycline, Tamiflu in the ED.  Started on amiodarone infusion.  Also received morphine, 1 L IV fluids and 40 mEq p.o. potassium x 2.  Cardiology consulted and recommended amiodarone fusion, repletion of magnesium and potassium as needed, adding back beta-blocker as blood pressure allows.  Pertinent Medical History:   has a past medical history of Acute kidney injury superimposed on chronic kidney disease (HCC) (10/02/2022), Allergy, Arthritis, Atrial fibrillation (HCC), Chronic renal insufficiency, Chronic systolic CHF (congestive heart failure) (HCC) (03/20/2013), Chronic systolic heart failure (HCC), Coronary artery disease (01/31/2011), Diabetes mellitus, 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, History of chicken pox, Hyperlipidemia, Hypertension, Ischemic cardiomyopathy, Knee MCL sprain (03/28/2014), Left knee pain (08/30/2012), Lumbar spondylosis (01/11/2012), Pes anserine bursitis (02/23/2014), Sleep apnea, Stroke (HCC) (2010), Suicide attempt by benzodiazepine overdose (HCC) (01/21/2020), UTI (urinary tract infection) (08/03/2013), and Ventricular tachycardia (HCC).   Significant Hospital Events: Including procedures, antibiotic start and stop dates in addition to other pertinent events   2/27 Presented to ED feeling unwell, s/p AICD discharge x 3 for VT/slow VT. Hypotensive with increased WOB prompting intubation. Flu A+,  CXR c/w PNA. 2/28 Remains on high-dose vasopressor support with Levophed. No more episodes of V. Tach. Remain acidotic mixed metabolic and respiratory. 3/1 Remains on ventilator with FiO2 50 PEEP 8, remains on both levo and vaso 3/3 extubated to bipap 3/4 treated w/ lasix still on NIPPV 3/5 inc'd WOB. CXR still w/ marked bilateral airspace disease. ABG w/ sig hypoxia and resp alk. PEEP inc'd on NIPPV. Increased lasix. Widened ABX coverage due to clinical decline   Interim History / Subjective:  Increased work of  breathing on bipap, follows commands,  weak and fatigued. Unable to nod answers to questions.   Objective:  Blood pressure (!) 177/67, pulse 66, temperature 98.1 F (36.7 C), resp. rate (!) 30, height 5\' 2"  (1.575 m), weight 94.7 kg, SpO2 98%.    Vent Mode: BIPAP;PCV FiO2 (%):  [50 %-70 %] 60 % Set Rate:  [12 bmp] 12 bmp Pressure Support:  [5 cmH20] 5 cmH20   Intake/Output Summary (Last 24 hours) at 05/26/2023 1203 Last data filed at 05/26/2023 0700 Gross per 24 hour  Intake 1013.34 ml  Output 1825 ml  Net -811.66 ml   Filed Weights   05/23/23 0500 05/24/23 0500 05/25/23 0800  Weight: 87.7 kg 92 kg 94.7 kg   Physical Examination: General: ill appearing in acute distress, fatigued, weak HEENT: bipap in place.  Neuro: opens eyes to voice, able to follow commands, unable to nod yes/no to questions.  CV: irregular, not tachycardic  PULM: diffuse bilateral rhonchi, using accessory muscles to breathe, PCXR w/ diffuse bilateral airspace disease. RUL continues to be worse than rest.  GI: soft Extremities: significant non-pitting edema all extremities, warm  Resolved Hospital Problem List:  Septic shock  Assessment & Plan:  Acute respiratory failure with hypoxia and hypercapnia due to  Influenza pneumonia and Bilateral superimposed bacterial pneumonia.  As of 3/5 further complicated by pulmonary edema and concern for HCAP Appears very fatigued, concerned about ability to maintain respiratory effort on bipap. Cxr worsening. WBC increasing. Unable to provide sputum culture at this time.  Plan:  ABG Chest x-ray Increase peep to 8 Supplemental O2 support for sat > 90% BiPAP continuous  Cont solumedrol and tamiflu Ceftriaxone/doxycyline last day. Broaden antibiotics for clinical decompensation to cefepime and vanc  High risk for reintubation Obtain sputum cx if ends up reintubated  Sepsis secondary to bilateral bacterial pneumonia  Leukocytosis WBC jumped up from 6 to 14 since 3/3. Afebrile. Cxr worsening. Off  pressors Plan Goal MAP > 65 Broaden antibiotic coverage as above F/u lactic acid  Acute septic encephalopathy - improving Able to follow commands this am but unable to nod yes/no to questions. Unclear if this is fatigue or encephalopathy.  Plan Correct metabolic derangements Limit sedating medications   Ventricular tachycardia likely in the setting of acute illness Paroxysmal A-fib/A-flutter Patient takes amiodarone and Xarelto at home. AICD in place. 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 Significant non-pitting edema all extremities Plan Lasix 120 today  Resume home GDMT as tolerated Continue ASA  AKI on CKD stage IIIb  Resp alkalosis with metabolic acidosis Baseline creatinine ~ 2 with recent acute increases up to 2.7 02/2023. Improving from yesterday 2.4 down from 2.52 Plan Trend BMP Avoid nephrotoxic agents as able Ensure adequate renal perfusion Renal dose meds Strict I&O  Shock liver, improved LFTs continue to downtrend. Plan: Trend intermittently  Fluid and electrolyte imbalance, hypokalemia Potassium 3.5 this am, this is likely to decrease with lasix Plan 4 runs potassium    Diabetes type 2, with hyperglycemia Well controlled on current regimen SSI Goal CBG 140-180  Anemia and thrombocytopenia of critical illness Plan Trend H&H, Plt Monitor for signs of active bleeding Transfuse for Hgb < 7.0, Plt < 20K or hemodynamically significant bleeding  Best Practice (right  click and "Reselect all SmartList Selections" daily)   Diet/type: NPO DVT prophylaxis IV heparin infusion Pressure ulcer(s): N/A GI prophylaxis: H2B Lines: right internal jugular CVL -  05/20/23 Foley: Yes needed Code Status:  full code Last date of multidisciplinary goals of care discussion: 3/2: Patient's sister was updated over the phone, granddaughter at bedside  Critical care time: (per attending) note reviewed w/ Deniece Ree ACNP-S  Simonne Martinet ACNP-BC [facility] Pulmonary/Critical Care Pager # [phone] OR # [phone] if no answer

## 2023-05-26 NOTE — Progress Notes (Signed)
 Pharmacy Antibiotic Note  [name] is a 78 y.o. female admitted on 05/20/2023 with sepsis.  Pharmacy has been consulted for vancomycin and cefepime dosing.  Has received 7 days of antibiotics. WBC today increased up to 16 (on steroids), afebrile. Having increased WOB - CXR with marked bilateral airspace disease.   Plan: Vancomycin 1750 mg IV once then 750 mg IV every 48 hours (estAUC 452, Vd 0.5, Scr 2.4) Cefepime 2g IV every 24 hours Monitor renal fx, cx results, clinical pic, and LOT   Height: 5\' 2"  (157.5 cm) Weight: 94.7 kg (208 lb 12.4 oz) IBW/kg (Calculated) : 50.1  Temp (24hrs), Avg:97.9 F (36.6 C), Min:97.2 F (36.2 C), Max:98.1 F (36.7 C)  Recent Labs  Lab 05/21/23 0145 05/21/23 0346 05/21/23 0854 05/21/23 1050 05/21/23 1307 05/22/23 0326 05/22/23 0407 05/22/23 0753 05/23/23 0207 05/23/23 2121 05/24/23 0405 05/24/23 0515 05/25/23 0400 05/25/23 2222 05/26/23 0347  WBC  --    < >  --   --   --   --  9.2  --  9.0  --  6.0  --  12.9*  --  16.4*  CREATININE  --    < >  --   --   --    < >  --   --  2.80* 2.54*  --  2.57* 2.52* 2.40* 2.40*  LATICACIDVEN 2.9*  --  3.4* 4.2* 5.2*  --   --  3.0*  --   --   --   --   --   --   --    < > = values in this interval not displayed.    Estimated Creatinine Clearance: 21.4 mL/min (A) (by C-G formula based on SCr of 2.4 mg/dL (H)).    Allergies  Allergen Reactions   Veltassa [Patiromer] Diarrhea   Aldactone [Spironolactone] Diarrhea and Nausea And Vomiting   Darvon [Propoxyphene] Other (See Comments)    Indigestion    Mexitil [Mexiletine] Nausea And Vomiting and Other (See Comments)    Tremors Difficulty walking Blurred vision   Phenergan [Promethazine Hcl] Other (See Comments)    Hyperactivity    Ranexa [Ranolazine Er] Diarrhea   Plavix [Clopidogrel Bisulfate] Rash    Antimicrobials this admission: CTX 2/27>> 3/5 Doxy 2/27 >> 3/5 Cefepime 3/5>> Vanc 2/5>>  Dose adjustments this admission: N/A    Microbiology results: 2/28 MRSA- neg 2/27 blood x2- ngtd  Thank you for allowing pharmacy to participate in this patient's care,  Sherron Monday, PharmD, BCCCP Clinical Pharmacist  Phone: [phone] 05/26/2023 12:12 PM  Please check AMION for all [facility] Pharmacy phone numbers After 10:00 PM, call Main Pharmacy [phone]

## 2023-05-26 NOTE — Progress Notes (Signed)
 PHARMACY - ANTICOAGULATION CONSULT NOTE  Pharmacy Consult for heparin  Indication: atrial fibrillation  Allergies  Allergen Reactions   Veltassa [Patiromer] Diarrhea   Aldactone [Spironolactone] Diarrhea and Nausea And Vomiting   Darvon [Propoxyphene] Other (See Comments)    Indigestion    Mexitil [Mexiletine] Nausea And Vomiting and Other (See Comments)    Tremors Difficulty walking Blurred vision   Phenergan [Promethazine Hcl] Other (See Comments)    Hyperactivity    Ranexa [Ranolazine Er] Diarrhea   Plavix [Clopidogrel Bisulfate] Rash    Patient Measurements: Height: 5\' 2"  (157.5 cm) Weight: 94.7 kg (208 lb 12.4 oz) IBW/kg (Calculated) : 50.1 HEPARIN DW (KG): 68.6   Vital Signs: Temp: 97.2 F (36.2 C) (03/05 0700) Temp Source: Axillary (03/05 0700) BP: 123/74 (03/05 0900) Pulse Rate: 62 (03/05 0900)  Labs: Recent Labs    05/24/23 0405 05/24/23 0515 05/24/23 1244 05/24/23 1308 05/25/23 0400 05/25/23 0742 05/25/23 2222 05/26/23 0347 05/26/23 0634  HGB 10.5*  --    < >  --  10.9* 11.9*  --  10.7* 12.2  HCT 30.9*  --    < >  --  32.2* 35.0*  --  30.7* 36.0  PLT 119*  --   --   --  116*  --   --  100*  --   APTT  --  61*  --  81* 72*  --   --   --   --   HEPARINUNFRC  --  0.53  --   --  0.47  --   --  0.35  --   CREATININE  --  2.57*  --   --  2.52*  --  2.40* 2.40*  --    < > = values in this interval not displayed.    Estimated Creatinine Clearance: 21.4 mL/min (A) (by C-G formula based on SCr of 2.4 mg/dL (H)).   Medical History: Past Medical History:  Diagnosis Date   Acute kidney injury superimposed on chronic kidney disease (HCC) 10/02/2022   Allergy    Arthritis    Atrial fibrillation (HCC)    controlled with amiodarone, on coumadin   Chronic renal insufficiency    Chronic systolic CHF (congestive heart failure) (HCC) 03/20/2013   Chronic systolic heart failure (HCC)    Coronary artery disease 01/31/2011   Diabetes mellitus    type 1    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    History of chicken pox    Hyperlipidemia    Hypertension    Ischemic cardiomyopathy    Knee MCL sprain 03/28/2014   Left knee pain 08/30/2012   Lumbar spondylosis 01/11/2012   Pes anserine bursitis 02/23/2014   Sleep apnea    Stroke (HCC) 2010   eye doctor said she had TIA   Suicide attempt by benzodiazepine overdose (HCC) 01/21/2020   UTI (urinary tract infection) 08/03/2013   Ventricular tachycardia (HCC)    Polymorphic    Medications:  Medications Prior to Admission  Medication Sig Dispense Refill Last Dose/Taking   amiodarone (PACERONE) 200 MG tablet Take 1 tablet (200 mg total) by mouth daily. 30 tablet 6 05/20/2023 Morning   aspirin EC 81 MG tablet Take 1 tablet (81 mg total) by mouth daily. Swallow whole. 30 tablet 12 05/20/2023 Morning   busPIRone (BUSPAR) 5 MG tablet Take 1 tablet (5 mg total) by mouth 2 (two) times daily. 60 tablet 1 05/20/2023 Morning   carvedilol (COREG) 25 MG tablet TAKE 1 TABLET (25 MG TOTAL) BY  MOUTH TWICE A DAY WITH MEALS 180 tablet 3 05/20/2023 Morning   Cholecalciferol (VITAMIN D-3 PO) Take 1 tablet by mouth daily.   05/20/2023 Morning   dicyclomine (BENTYL) 10 MG capsule TAKE 1 CAPSULE (10 MG TOTAL) BY MOUTH 4 (FOUR) TIMES DAILY BEFORE MEALS AND AT BEDTIME. 120 capsule 1 05/20/2023 Morning   furosemide (LASIX) 20 MG tablet Take 2 tablets (40 mg) by mouth once a day. 60 tablet 11 05/20/2023 Morning   gabapentin (NEURONTIN) 100 MG capsule Take 200 mg by mouth 2 (two) times daily.   05/20/2023 Morning   insulin aspart (NOVOLOG FLEXPEN) 100 UNIT/ML FlexPen Inject up to 15 units daily under skin as advised (Patient taking differently: Inject 4-5 Units into the skin 3 (three) times daily before meals. Inject up to 15 units daily under skin as advised) 15 mL 11 Past Week   insulin degludec (TRESIBA FLEXTOUCH) 100 UNIT/ML FlexTouch Pen Inject 12-14 Units into the skin daily. (Patient taking differently: Inject 14  Units into the skin at bedtime.) 9 mL 3 05/19/2023 Bedtime   levETIRAcetam (KEPPRA XR) 500 MG 24 hr tablet Take 1 tablet (500 mg total) by mouth at bedtime. 90 tablet 3 05/19/2023 Bedtime   losartan (COZAAR) 25 MG tablet Take 25 mg by mouth daily.   05/20/2023 Morning   Multiple Vitamins-Minerals (EYE VITAMINS PO) Take 1 tablet by mouth 2 (two) times daily.   05/20/2023 Morning   nitroGLYCERIN (NITROSTAT) 0.4 MG SL tablet Place 1 tablet (0.4 mg total) under the tongue every 5 (five) minutes as needed for chest pain. 25 tablet 3 Past Month   omeprazole (PRILOSEC) 20 MG capsule Take 1 capsule by mouth daily.   05/20/2023 Morning   Rivaroxaban (XARELTO) 15 MG TABS tablet Take 1 tablet (15 mg total) by mouth daily with supper. (Patient taking differently: Take 15 mg by mouth at bedtime.) 30 tablet 6 05/19/2023 Bedtime   rosuvastatin (CRESTOR) 40 MG tablet Take 1 tablet (40 mg total) by mouth daily. NEEDS FOLLOW UP APPOINTMENT FOR MORE REFILLS (Patient taking differently: Take 40 mg by mouth at bedtime.) 90 tablet 0 05/19/2023 Bedtime   sertraline (ZOLOFT) 50 MG tablet Take 50 mg by mouth at bedtime.   05/19/2023 Bedtime   Continuous Glucose Sensor (FREESTYLE LIBRE 3 SENSOR) MISC 1 each by Does not apply route every 14 (fourteen) days. 6 each 3    magnesium oxide (MAG-OX) 400 (240 Mg) MG tablet TAKE 1 TABLET BY MOUTH TWICE A DAY (Patient not taking: Reported on 05/20/2023) 180 tablet 1 Not Taking   Scheduled:   aspirin  81 mg Per Tube Daily   busPIRone  5 mg Per Tube BID   Chlorhexidine Gluconate Cloth  6 each Topical Daily   famotidine  20 mg Per Tube Daily   gabapentin  200 mg Per Tube Q12H   insulin aspart  0-15 Units Subcutaneous Q4H   insulin glargine  10 Units Subcutaneous Daily   ipratropium-albuterol  3 mL Nebulization Q6H   methylPREDNISolone (SOLU-MEDROL) injection  40 mg Intravenous Q12H   polyethylene glycol  17 g Per Tube BID   sennosides  10 mL Per Tube BID   sertraline  50 mg Per Tube QHS    sorbitol  30 mL Oral Once    Assessment: 78 yo female here with VT and ICD shock noted with, VDRF and AKI. 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   Heparin level is therapeutic at 0.35, on 1000 units/hr. Hgb 12.2, plt 100. No s/sx of bleeding or infusion issues. Running peripherally and drawn from A-line.  Goal of Therapy:  Heparin level 0.3-0.7 units/ml aPTT 66-102 seconds Monitor platelets by anticoagulation protocol: Yes   Plan:  Continue IV heparin infusion at 1000 units/hr. Daily heparin level, and CBC.  Thank you for allowing pharmacy to participate in this patient's care,  Sherron Monday, PharmD, BCCCP Clinical Pharmacist  Phone: [phone] 05/26/2023 9:42 AM  Please check AMION for all [facility] Pharmacy phone numbers After 10:00 PM, call Main Pharmacy [phone]

## 2023-05-26 NOTE — Progress Notes (Signed)
 PT Cancellation Note  Patient Details Name: [name] MRN: [id number] DOB: March 17, 1946   Cancelled Treatment:    Reason Eval/Treat Not Completed: Patient not medically ready. Pt remains on bipap with tenuous respiratory status and is very lethargic. Pt unable to participate in therapy at this time. PT to return as able, as appropriate to complete PT eval.  Lewis Shock, PT, DPT Acute Rehabilitation Services Secure chat preferred Office #: [phone]    [name] 05/26/2023, 9:33 AM

## 2023-05-26 NOTE — Procedures (Signed)
 Person Inserting Tube:  Kendell Bane C, RD Tube Type:  Cortrak - 43 inches Tube Size:  10 Tube Location:  Left nare Initial Placement:  Stomach Secured by: Bridle Technique Used to Measure Tube Placement:  Marking at nare/corner of mouth Cortrak Secured At:  77 cm   Cortrak Tube Team Note:  Consult received to place a Cortrak feeding tube.   No x-ray is required. RN may begin using tube.   If the tube becomes dislodged please keep the tube and contact the Cortrak team at [email or web address] for replacement.  If after hours and replacement cannot be delayed, place a NG tube and confirm placement with an abdominal x-ray.    Cammy Copa., RD, LDN, CNSC See AMiON for contact information

## 2023-05-26 NOTE — Progress Notes (Signed)
 eLink Physician-Brief Progress Note Patient Name: [name] DOB: 1945-05-12 MRN: [id number]   Date of Service  05/26/2023  HPI/Events of Note  K 3.3 Creat 2.4, GFR 20 has feeding tube and CVC  eICU Interventions  Ordered K packet 40 meqs Add on Mg level     Intervention Category Intermediate Interventions: Electrolyte abnormality - evaluation and management  Darl Pikes 05/26/2023, 5:13 AM

## 2023-05-26 NOTE — Progress Notes (Signed)
 Telemetry stable, AFib CVR, less burden of AIVR/slower WC rhythm BIPAP support Described as lethargic RN reports follows commands though very weak, and confirms lethargy  Continue amiodarone gtt until able to take PO reliably and respiratory status stabilizes.  Prognosis is guarded  Management with CCM/team  Francis Dowse, PA-C

## 2023-05-26 NOTE — Progress Notes (Signed)
 Initial Nutrition Assessment  DOCUMENTATION CODES:   Not applicable  INTERVENTION:   Tube Feeding via Cortrak:  Vital AF 1.2 at 60 ml/hr Begin TF at rate of 20 ml/hr, titrate by 10 mL q 8 hours until goal rate of 55 ml/hr TF at goal provides 108 g of protein, 1728 kcals, 1166 mL of free water  Monitor magnesium, potassium, and phosphorus BID for at least 3 days, MD to replete as needed, as pt is at risk for refeeding syndrome given prolonged NPO Status.   Add Thiamine 100 mg daily for 7 days   NUTRITION DIAGNOSIS:   Inadequate oral intake related to acute illness as evidenced by NPO status.  GOAL:   Patient will meet greater than or equal to 90% of their needs  MONITOR:   Diet advancement, Labs, Weight trends, TF tolerance, Skin  REASON FOR ASSESSMENT:   Consult Enteral/tube feeding initiation and management, Assessment of nutrition requirement/status  ASSESSMENT:   78 yo female admitted with acute respiratory failure with bacterial pneumonia in addition to Flu A+, severe sepsis with shock, AKI on CKD stage 3b, shock liver. PMH includes chronic biventricular heart failure (EF 35-40% 12/2022), DM  2/27 Admitted, Intubated, CT A/P with no acute process in abdomen or pelvis 3/03 Extubated, BiPap 3/04 BiPap continues  NPO since admission, did not receive EN while on vent. Cortrak placement today. Remains on BiPap, high risk for re-intubation  Unable to obtain nutrition history at this time from pt   Weight hast trended up since admission Current wt: 94.7 kg Admit wt: 83-84 kg  Noted significant non-pitting and pitting edema present. IV lasix administration today  +constipation with no BM documented since 2/27. Noted dulcolax suppository x 1 today, while on BiPap pt has been unable to take oral meds. Now able to administer via Cortrak-miralax daily, senokot BID.   Labs: Sodium 141 Potassium 3.3 (L) Magnesium 2.0 Phosphorus 3.3 CBGs 91-139  Meds:  Moderate  sliding scale q 4 hour coverage Lantus 10 units daily Solumedrol  NUTRITION - FOCUSED PHYSICAL EXAM:  Flowsheet Row Most Recent Value  Orbital Region Unable to assess  [BiPap Mask]  Upper Arm Region Unable to assess  [edema]  Thoracic and Lumbar Region Unable to assess  [edema]  Buccal Region Unable to assess  [Bipap Mask]  Temple Region No depletion  Clavicle Bone Region No depletion  Clavicle and Acromion Bone Region No depletion  Scapular Bone Region No depletion  Dorsal Hand Unable to assess  [edema]  Patellar Region Unable to assess  [edema]  Anterior Thigh Region Unable to assess  [edema]  Posterior Calf Region Unable to assess  [edema]  Edema (RD Assessment) Severe        Diet Order:   Diet Order             Diet NPO time specified  Diet effective now                   EDUCATION NEEDS:   Not appropriate for education at this time  Skin:  Skin Assessment: Skin Integrity Issues: Skin Integrity Issues:: DTI, Other (Comment) DTI: L sacrum Other: MASD to vertebral column  Last BM:  2/27, +constipation, abdomen soft, BS present  Height:   Ht Readings from Last 1 Encounters:  05/20/23 5\' 2"  (1.575 m)    Weight:   Wt Readings from Last 1 Encounters:  05/25/23 94.7 kg    BMI:  Body mass index is 38.19 kg/m.  Estimated Nutritional Needs:  Kcal:  1600-1800 kcals  Protein:  90-110 g  Fluid:  1.7 L   Romelle Starcher MS, RDN, LDN, CNSC Registered Dietitian 3 Clinical Nutrition RD Inpatient Contact Info in Amion

## 2023-05-27 DIAGNOSIS — G9340 Encephalopathy, unspecified: Secondary | ICD-10-CM | POA: Diagnosis not present

## 2023-05-27 DIAGNOSIS — E1165 Type 2 diabetes mellitus with hyperglycemia: Secondary | ICD-10-CM | POA: Diagnosis not present

## 2023-05-27 DIAGNOSIS — E8721 Acute metabolic acidosis: Secondary | ICD-10-CM

## 2023-05-27 DIAGNOSIS — A419 Sepsis, unspecified organism: Secondary | ICD-10-CM | POA: Diagnosis not present

## 2023-05-27 DIAGNOSIS — N1832 Chronic kidney disease, stage 3b: Secondary | ICD-10-CM | POA: Diagnosis not present

## 2023-05-27 DIAGNOSIS — I5042 Chronic combined systolic (congestive) and diastolic (congestive) heart failure: Secondary | ICD-10-CM | POA: Diagnosis not present

## 2023-05-27 DIAGNOSIS — J9601 Acute respiratory failure with hypoxia: Secondary | ICD-10-CM | POA: Diagnosis not present

## 2023-05-27 DIAGNOSIS — D649 Anemia, unspecified: Secondary | ICD-10-CM

## 2023-05-27 DIAGNOSIS — J9602 Acute respiratory failure with hypercapnia: Secondary | ICD-10-CM | POA: Diagnosis not present

## 2023-05-27 DIAGNOSIS — J101 Influenza due to other identified influenza virus with other respiratory manifestations: Secondary | ICD-10-CM | POA: Diagnosis not present

## 2023-05-27 LAB — BASIC METABOLIC PANEL
Anion gap: 14 (ref 5–15)
Anion gap: 10 (ref 5–15)
BUN: 76 mg/dL — ABNORMAL HIGH (ref 8–23)
BUN: 83 mg/dL — ABNORMAL HIGH (ref 8–23)
CO2: 17 mmol/L — ABNORMAL LOW (ref 22–32)
CO2: 17 mmol/L — ABNORMAL LOW (ref 22–32)
Calcium: 8.3 mg/dL — ABNORMAL LOW (ref 8.9–10.3)
Calcium: 8 mg/dL — ABNORMAL LOW (ref 8.9–10.3)
Chloride: 112 mmol/L — ABNORMAL HIGH (ref 98–111)
Chloride: 117 mmol/L — ABNORMAL HIGH (ref 98–111)
Creatinine, Ser: 2.34 mg/dL — ABNORMAL HIGH (ref 0.44–1.00)
Creatinine, Ser: 2.37 mg/dL — ABNORMAL HIGH (ref 0.44–1.00)
GFR, Estimated: 21 mL/min — ABNORMAL LOW (ref >60)
GFR, Estimated: 21 mL/min — ABNORMAL LOW (ref >60)
Glucose, Bld: 253 mg/dL — ABNORMAL HIGH (ref 70–99)
Glucose, Bld: 227 mg/dL — ABNORMAL HIGH (ref 70–99)
Potassium: 3.7 mmol/L (ref 3.5–5.1)
Potassium: 3.8 mmol/L (ref 3.5–5.1)
Sodium: 143 mmol/L (ref 135–145)
Sodium: 144 mmol/L (ref 135–145)

## 2023-05-27 LAB — CBC
HCT: 28.5 % — ABNORMAL LOW (ref 36.0–46.0)
Hemoglobin: 10 g/dL — ABNORMAL LOW (ref 12.0–15.0)
MCH: 28 pg (ref 26.0–34.0)
MCHC: 35.1 g/dL (ref 30.0–36.0)
MCV: 79.8 fL — ABNORMAL LOW (ref 80.0–100.0)
Platelets: 107 10*3/uL — ABNORMAL LOW (ref 150–400)
RBC: 3.57 MIL/uL — ABNORMAL LOW (ref 3.87–5.11)
RDW: 18.9 % — ABNORMAL HIGH (ref 11.5–15.5)
WBC: 15.9 10*3/uL — ABNORMAL HIGH (ref 4.0–10.5)
nRBC: 1.9 % — ABNORMAL HIGH (ref 0.0–0.2)

## 2023-05-27 LAB — BLOOD GAS, ARTERIAL
Acid-base deficit: 5.1 mmol/L — ABNORMAL HIGH (ref 0.0–2.0)
Bicarbonate: 18.8 mmol/L — ABNORMAL LOW (ref 20.0–28.0)
O2 Saturation: 96.1 %
Patient temperature: 36.5
pCO2 arterial: 31 mmHg — ABNORMAL LOW (ref 32–48)
pH, Arterial: 7.39 (ref 7.35–7.45)
pO2, Arterial: 71 mmHg — ABNORMAL LOW (ref 83–108)

## 2023-05-27 LAB — GLUCOSE, CAPILLARY
Glucose-Capillary: 237 mg/dL — ABNORMAL HIGH (ref 70–99)
Glucose-Capillary: 226 mg/dL — ABNORMAL HIGH (ref 70–99)
Glucose-Capillary: 253 mg/dL — ABNORMAL HIGH (ref 70–99)
Glucose-Capillary: 205 mg/dL — ABNORMAL HIGH (ref 70–99)
Glucose-Capillary: 197 mg/dL — ABNORMAL HIGH (ref 70–99)

## 2023-05-27 LAB — HEPARIN LEVEL (UNFRACTIONATED): Heparin Unfractionated: 0.35 [IU]/mL (ref 0.30–0.70)

## 2023-05-27 LAB — PHOSPHORUS: Phosphorus: 4 mg/dL (ref 2.5–4.6)

## 2023-05-27 LAB — MAGNESIUM: Magnesium: 1.9 mg/dL (ref 1.7–2.4)

## 2023-05-27 MED ORDER — ORAL CARE MOUTH RINSE
15.0000 mL | OROMUCOSAL | Status: DC
  Administered 2023-05-27: 15 mL via OROMUCOSAL

## 2023-05-27 MED ORDER — INSULIN GLARGINE 100 UNIT/ML ~~LOC~~ SOLN: 10.0000 [IU] | Freq: Every day | SUBCUTANEOUS | Status: DC

## 2023-05-27 MED ORDER — FUROSEMIDE 10 MG/ML IJ SOLN
120.0000 mg | Freq: Three times a day (TID) | INTRAMUSCULAR | Status: AC
  Administered 2023-05-27 – 2023-05-28 (×3): 120 mg via INTRAVENOUS
  Filled 2023-05-27: qty 10
  Filled 2023-05-27: qty 2
  Filled 2023-05-27: qty 10

## 2023-05-27 MED ORDER — INSULIN GLARGINE 100 UNIT/ML ~~LOC~~ SOLN
8.0000 [IU] | Freq: Two times a day (BID) | SUBCUTANEOUS | Status: DC
  Administered 2023-05-27: 8 [IU] via SUBCUTANEOUS
  Filled 2023-05-27 (×3): qty 0.08

## 2023-05-27 MED ORDER — INSULIN ASPART 100 UNIT/ML IJ SOLN
4.0000 [IU] | INTRAMUSCULAR | Status: DC
  Administered 2023-05-27 – 2023-05-31 (×22): 4 [IU] via SUBCUTANEOUS

## 2023-05-27 MED ORDER — ORAL CARE MOUTH RINSE: 15.0000 mL | OROMUCOSAL | Status: DC | PRN

## 2023-05-27 MED ORDER — POTASSIUM CHLORIDE 10 MEQ/50ML IV SOLN
10.0000 meq | INTRAVENOUS | Status: AC
  Administered 2023-05-27 (×4): 10 meq via INTRAVENOUS
  Filled 2023-05-27 (×4): qty 50

## 2023-05-27 MED ORDER — MAGNESIUM SULFATE 2 GM/50ML IV SOLN
2.0000 g | Freq: Once | INTRAVENOUS | Status: AC
  Administered 2023-05-27: 2 g via INTRAVENOUS
  Filled 2023-05-27: qty 50

## 2023-05-27 MED ORDER — ORAL CARE MOUTH RINSE
15.0000 mL | OROMUCOSAL | Status: DC
  Administered 2023-05-27 – 2023-05-28 (×4): 15 mL via OROMUCOSAL

## 2023-05-27 MED ORDER — METOLAZONE 5 MG PO TABS
5.0000 mg | ORAL_TABLET | Freq: Once | ORAL | Status: AC
  Administered 2023-05-27: 5 mg
  Filled 2023-05-27: qty 1

## 2023-05-27 MED ORDER — SORBITOL 70 % SOLN
30.0000 mL | Freq: Once | Status: AC
  Administered 2023-05-27: 30 mL
  Filled 2023-05-27: qty 30

## 2023-05-27 MED ORDER — INSULIN GLARGINE 100 UNIT/ML ~~LOC~~ SOLN: 7.0000 [IU] | Freq: Every day | SUBCUTANEOUS | Status: DC

## 2023-05-27 NOTE — Inpatient Diabetes Management (Signed)
 Inpatient Diabetes Program Recommendations  AACE/ADA: New Consensus Statement on Inpatient Glycemic Control (2015)  Target Ranges:  Prepandial:   less than 140 mg/dL      Peak postprandial:   less than 180 mg/dL (1-2 hours)      Critically ill patients:  140 - 180 mg/dL   Lab Results  Component Value Date   GLUCAP 226 (H) 05/27/2023   HGBA1C 7.7 (H) 05/20/2023    Review of Glycemic Control  Latest Reference Range & Units 05/26/23 16:32 05/26/23 20:15 05/26/23 23:10 05/27/23 04:10 05/27/23 07:28  Glucose-Capillary 70 - 99 mg/dL 161 (H) 096 (H) 045 (H) 237 (H) 226 (H)  (H): Data is abnormally high Diabetes history: Type 2 DM Outpatient Diabetes medications: Tresiba 14 units at bedtime, Novolog 4-5 units TID Current orders for Inpatient glycemic control: Lantus 5 units every day, Novolog 0-15 units Q4H Solumedrol 40 mg QD  Inpatient Diabetes Program Recommendations:   In the setting of steroids: Consider adding Novolog 4 units Q4H for tube feed coverage (to be stopped or held in the event tube feeds stopped).   Thanks, Lujean Rave, MSN, RNC-OB Diabetes Coordinator [phone] (8a-5p)

## 2023-05-27 NOTE — Progress Notes (Signed)
 RT placed patient on HHFNC per Cindee Lame, NP with CCM. Patient noted to have some accessory muscle use and tachypnea. NP assessed patient after placed on HHFNC. Patient staying on HHFNC at this time for a small break off bipap per NP. RN at bedside and aware. RT will continue to monitor.

## 2023-05-27 NOTE — Plan of Care (Signed)
  Problem: Coping: Goal: Ability to adjust to condition or change in health will improve Outcome: Not Progressing

## 2023-05-27 NOTE — Progress Notes (Signed)
 PHARMACY - ANTICOAGULATION CONSULT NOTE  Pharmacy Consult for heparin  Indication: atrial fibrillation  Allergies  Allergen Reactions   Veltassa [Patiromer] Diarrhea   Aldactone [Spironolactone] Diarrhea and Nausea And Vomiting   Darvon [Propoxyphene] Other (See Comments)    Indigestion    Mexitil [Mexiletine] Nausea And Vomiting and Other (See Comments)    Tremors Difficulty walking Blurred vision   Phenergan [Promethazine Hcl] Other (See Comments)    Hyperactivity    Ranexa [Ranolazine Er] Diarrhea   Plavix [Clopidogrel Bisulfate] Rash    Patient Measurements: Height: 5\' 2"  (157.5 cm) Weight: 95 kg (209 lb 7 oz) IBW/kg (Calculated) : 50.1 HEPARIN DW (KG): 68.6   Vital Signs: Temp: 97.9 F (36.6 C) (03/06 0700) Temp Source: Bladder (03/06 0332) BP: 131/75 (03/06 1000) Pulse Rate: 97 (03/06 1000)  Labs: Recent Labs    05/24/23 1244 05/24/23 1308 05/25/23 0400 05/25/23 0742 05/25/23 2222 05/26/23 0347 05/26/23 0634 05/26/23 1125 05/27/23 0403  HGB  --   --  10.9*   < >  --  10.7* 12.2 11.9* 10.0*  HCT  --   --  32.2*   < >  --  30.7* 36.0 35.0* 28.5*  PLT  --   --  116*  --   --  100*  --   --  107*  APTT  --  81* 72*  --   --   --   --   --   --   HEPARINUNFRC  --   --  0.47  --   --  0.35  --   --  0.35  CREATININE   < >  --  2.52*  --  2.40* 2.40*  --   --  2.34*   < > = values in this interval not displayed.    Estimated Creatinine Clearance: 22 mL/min (A) (by C-G formula based on SCr of 2.34 mg/dL (H)).   Medical History: Past Medical History:  Diagnosis Date   Acute kidney injury superimposed on chronic kidney disease (HCC) 10/02/2022   Allergy    Arthritis    Atrial fibrillation (HCC)    controlled with amiodarone, on coumadin   Chronic renal insufficiency    Chronic systolic CHF (congestive heart failure) (HCC) 03/20/2013   Chronic systolic heart failure (HCC)    Coronary artery disease 01/31/2011   Diabetes mellitus    type 1   Dual  implantable cardiac defibrillator [employer]    History of chicken pox    Hyperlipidemia    Hypertension    Ischemic cardiomyopathy    Knee MCL sprain 03/28/2014   Left knee pain 08/30/2012   Lumbar spondylosis 01/11/2012   Pes anserine bursitis 02/23/2014   Sleep apnea    Stroke (HCC) 2010   eye doctor said she had TIA   Suicide attempt by benzodiazepine overdose (HCC) 01/21/2020   UTI (urinary tract infection) 08/03/2013   Ventricular tachycardia (HCC)    Polymorphic    Medications:  Medications Prior to Admission  Medication Sig Dispense Refill Last Dose/Taking   amiodarone (PACERONE) 200 MG tablet Take 1 tablet (200 mg total) by mouth daily. 30 tablet 6 05/20/2023 Morning   aspirin EC 81 MG tablet Take 1 tablet (81 mg total) by mouth daily. Swallow whole. 30 tablet 12 05/20/2023 Morning   busPIRone (BUSPAR) 5 MG tablet Take 1 tablet (5 mg total) by mouth 2 (two) times daily. 60 tablet 1 05/20/2023 Morning   carvedilol (COREG) 25 MG tablet TAKE 1 TABLET (25  MG TOTAL) BY MOUTH TWICE A DAY WITH MEALS 180 tablet 3 05/20/2023 Morning   Cholecalciferol (VITAMIN D-3 PO) Take 1 tablet by mouth daily.   05/20/2023 Morning   dicyclomine (BENTYL) 10 MG capsule TAKE 1 CAPSULE (10 MG TOTAL) BY MOUTH 4 (FOUR) TIMES DAILY BEFORE MEALS AND AT BEDTIME. 120 capsule 1 05/20/2023 Morning   furosemide (LASIX) 20 MG tablet Take 2 tablets (40 mg) by mouth once a day. 60 tablet 11 05/20/2023 Morning   gabapentin (NEURONTIN) 100 MG capsule Take 200 mg by mouth 2 (two) times daily.   05/20/2023 Morning   insulin aspart (NOVOLOG FLEXPEN) 100 UNIT/ML FlexPen Inject up to 15 units daily under skin as advised (Patient taking differently: Inject 4-5 Units into the skin 3 (three) times daily before meals. Inject up to 15 units daily under skin as advised) 15 mL 11 Past Week   insulin degludec (TRESIBA FLEXTOUCH) 100 UNIT/ML FlexTouch Pen Inject 12-14 Units into the skin daily. (Patient taking differently: Inject 14 Units  into the skin at bedtime.) 9 mL 3 05/19/2023 Bedtime   levETIRAcetam (KEPPRA XR) 500 MG 24 hr tablet Take 1 tablet (500 mg total) by mouth at bedtime. 90 tablet 3 05/19/2023 Bedtime   losartan (COZAAR) 25 MG tablet Take 25 mg by mouth daily.   05/20/2023 Morning   Multiple Vitamins-Minerals (EYE VITAMINS PO) Take 1 tablet by mouth 2 (two) times daily.   05/20/2023 Morning   nitroGLYCERIN (NITROSTAT) 0.4 MG SL tablet Place 1 tablet (0.4 mg total) under the tongue every 5 (five) minutes as needed for chest pain. 25 tablet 3 Past Month   omeprazole (PRILOSEC) 20 MG capsule Take 1 capsule by mouth daily.   05/20/2023 Morning   Rivaroxaban (XARELTO) 15 MG TABS tablet Take 1 tablet (15 mg total) by mouth daily with supper. (Patient taking differently: Take 15 mg by mouth at bedtime.) 30 tablet 6 05/19/2023 Bedtime   rosuvastatin (CRESTOR) 40 MG tablet Take 1 tablet (40 mg total) by mouth daily. NEEDS FOLLOW UP APPOINTMENT FOR MORE REFILLS (Patient taking differently: Take 40 mg by mouth at bedtime.) 90 tablet 0 05/19/2023 Bedtime   sertraline (ZOLOFT) 50 MG tablet Take 50 mg by mouth at bedtime.   05/19/2023 Bedtime   Continuous Glucose Sensor (FREESTYLE LIBRE 3 SENSOR) MISC 1 each by Does not apply route every 14 (fourteen) days. 6 each 3    magnesium oxide (MAG-OX) 400 (240 Mg) MG tablet TAKE 1 TABLET BY MOUTH TWICE A DAY (Patient not taking: Reported on 05/20/2023) 180 tablet 1 Not Taking   Scheduled:   aspirin  81 mg Per Tube Daily   bisacodyl  10 mg Rectal Once   busPIRone  5 mg Per Tube BID   Chlorhexidine Gluconate Cloth  6 each Topical Daily   famotidine  20 mg Per Tube Daily   gabapentin  200 mg Per Tube Q12H   insulin aspart  0-15 Units Subcutaneous Q4H   insulin glargine  5 Units Subcutaneous Daily   ipratropium-albuterol  3 mL Nebulization Q6H   methylPREDNISolone (SOLU-MEDROL) injection  40 mg Intravenous Daily   mouth rinse  15 mL Mouth Rinse 4 times per day   polyethylene glycol  17 g Per  Tube BID   sennosides  10 mL Per Tube BID   sertraline  50 mg Per Tube QHS   thiamine  100 mg Per Tube Daily    Assessment: 78 yo female here with VT and ICD shock noted with, VDRF and AKI.  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 Pharmacy consulted to dose heparin.   Heparin level is therapeutic at 0.35, on 1000 units/hr. Hgb 10, plt 107. No s/sx of bleeding or infusion issues. Running peripherally and drawn from A-line.  Goal of Therapy:  Heparin level 0.3-0.7 units/ml aPTT 66-102 seconds Monitor platelets by anticoagulation protocol: Yes   Plan:  Continue IV heparin infusion at 1000 units/hr. Daily heparin level, and CBC.  Thank you for allowing pharmacy to participate in this patient's care,  Sherron Monday, PharmD, BCCCP Clinical Pharmacist  Phone: [phone] 05/27/2023 10:17 AM  Please check AMION for all [facility] phone numbers After 10:00 PM, call Main Pharmacy [phone]

## 2023-05-27 NOTE — Progress Notes (Signed)
 PT Cancellation Note  Patient Details Name: [name] MRN: [id number] DOB: 1945-06-25   Cancelled Treatment:    Reason Eval/Treat Not Completed: Patient not medically ready (Pt with increased WOB after attempting to d/c Bi-pap. Per RN, pt not able to participate meaningfully in therapy. Will continue to follow.)  Hilton Cork, PT, DPT Secure Chat Preferred  Rehab Office [phone]  Arturo Morton Brion Aliment 05/27/2023, 2:14 PM

## 2023-05-27 NOTE — Progress Notes (Signed)
 NAME:  [name], MRN:  [id number], DOB:  1945-09-07, LOS: 6 ADMISSION DATE:  05/20/2023, CONSULTATION DATE:  05/20/2023 REFERRING MD:  Alinda Money - [facility] CHIEF COMPLAINT:  N/V/Abd pain / Resp failure   History of Present Illness:  78 year old female with medical history significant of hypertension, hyperlipidemia, diabetes, neuropathy, hypothyroidism, CKD 3B, CAD, GERD, CVA, chronic systolic CHF, depression, anxiety, ventricular tachycardia, ventricular fibrillation, atrial fibrillation, status post ICD, obesity, seizure disorder, low back pain, spinal stenosis presenting after feeling unwell for several days and recent ICD discharges.   Patient reports that she has been unwell for possible days.  She is experience nausea vomiting and abdominal pain.  She reports that her defibrillator fired multiple times today.  ED provider confirmed this with the [facility] representative who noted that the device did fire 3 times a day for ventricular fibrillation.  Also noted periods of slow ventricular tachycardia as well as atrial fibrillation.   Patient denies fevers, chills.   ED Course: Vital signs in the ED notable for blood pressure initially in the 70 systolic now improved to the 120s.  Respiratory rate in the 20s.  Requiring 3 L to maintain saturations.  Lab workup included CMP with potassium 3.2, bicarb 18, gap 17, BUN 32, creatinine elevated 2.37 baseline of 2, glucose 2 1, calcium 7.9, protein 6.0, albumin 2.5, T. bili 1.9, AST 1076, ALT 663.  CBC with platelets 142.  BNP elevated to 2548.  Troponin trend 339, 417.  Lipase normal.  Respiratory panel positive for flu A.  Lactic acid pending.  Urinalysis pending.  Blood cultures pending.  VBG with normal pH pCO2 40.7.  Magnesium pending.  Chest x-ray with new bilateral opacities consistent with pneumonia.  CT of the abdomen pelvis showing no acute normality in the abdomen pelvis, bilateral opacity at the lung bases, left renal cysts.  Patient received  ceftriaxone, doxycycline, Tamiflu in the ED.  Started on amiodarone infusion.  Also received morphine, 1 L IV fluids and 40 mEq p.o. potassium x 2.  Cardiology consulted and recommended amiodarone fusion, repletion of magnesium and potassium as needed, adding back beta-blocker as blood pressure allows.  Pertinent Medical History:   has a past medical history of Acute kidney injury superimposed on chronic kidney disease (HCC) (10/02/2022), Allergy, Arthritis, Atrial fibrillation (HCC), Chronic renal insufficiency, Chronic systolic CHF (congestive heart failure) (HCC) (03/20/2013), Chronic systolic heart failure (HCC), Coronary artery disease (01/31/2011), Diabetes mellitus, 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, History of chicken pox, Hyperlipidemia, Hypertension, Ischemic cardiomyopathy, Knee MCL sprain (03/28/2014), Left knee pain (08/30/2012), Lumbar spondylosis (01/11/2012), Pes anserine bursitis (02/23/2014), Sleep apnea, Stroke (HCC) (2010), Suicide attempt by benzodiazepine overdose (HCC) (01/21/2020), UTI (urinary tract infection) (08/03/2013), and Ventricular tachycardia (HCC).   Significant Hospital Events: Including procedures, antibiotic start and stop dates in addition to other pertinent events   2/27 Presented to ED feeling unwell, s/p AICD discharge x 3 for VT/slow VT. Hypotensive with increased WOB prompting intubation. Flu A+,  CXR c/w PNA. 2/28 Remains on high-dose vasopressor support with Levophed. No more episodes of V. Tach. Remain acidotic mixed metabolic and respiratory. 3/1 Remains on ventilator with FiO2 50 PEEP 8, remains on both levo and vaso 3/3 extubated to bipap 3/4 treated w/ lasix still on NIPPV 3/5 inc'd WOB. CXR still w/ marked bilateral airspace disease. ABG w/ sig hypoxia and resp alk. PEEP inc'd on NIPPV. Increased lasix. Widened ABX coverage due to clinical decline  3/6 attempting short rotations of HHFNC but still w/  marked WOB   Interim History /  Subjective:  Appears more comfortable on bipap compared to yesterday, able to nod yes/no to questions. Still very weak. States she feels like she is getting enough air. Unable to raise arms on request  Objective:  Blood pressure (!) 131/51, pulse 65, temperature 97.7 F (36.5 C), temperature source Bladder, resp. rate (!) 24, height 5\' 2"  (1.575 m), weight 95 kg, SpO2 98%. CVP:  [5 mmHg-24 mmHg] 11 mmHg  Vent Mode: PCV;BIPAP FiO2 (%):  [50 %-80 %] 80 % Set Rate:  [12 bmp] 12 bmp Pressure Support:  [5 cmH20-8 cmH20] 8 cmH20   Intake/Output Summary (Last 24 hours) at 05/27/2023 0719 Last data filed at 05/27/2023 0700 Gross per 24 hour  Intake 1992.91 ml  Output 2490 ml  Net -497.09 ml   Filed Weights   05/25/23 0800 05/26/23 0703 05/27/23 0500  Weight: 94.7 kg 96.3 kg 95 kg   Physical Examination: General: ill appearing in acute distress, fatigued, weak HEENT: bipap and coretrack in place.  Neuro: opens eyes to voice, able to follow commands, able to nod yes/no to questions.  CV: rrr PULM: diffuse bilateral rhonchi, improved from yesterday GI: soft  Extremities: significant non-pitting edema all extremities, warm  Resolved Hospital Problem List:  Septic shock  Assessment & Plan:  Acute respiratory failure with hypoxia and hypercapnia due to  Influenza pneumonia (tamiflu completed) and Bilateral superimposed bacterial pneumonia.  As of 3/5 further complicated by pulmonary edema and concern for HCAP. WBC stable. Unable to provide sputum culture at this time. Appears slightly improved after lasix and broadened antibiotics yesterday.  Plan:  Supplemental O2 support for sat > 90% Trial off bipap today for short periods of time w/ heated high flow  Cont solumedrol at 40mg /d for now Continue cefepime and vanc  day 2  High risk for reintubation Obtain sputum cx if ends up reintubated Lasix every 8 hrs x 3 doses  Will ask palliative to assist w/ GOC   Sepsis secondary to bilateral  bacterial pneumonia  Leukocytosis WBC stable today. Off pressors Plan Goal MAP > 65 Antibiotics as above  Acute septic encephalopathy - improving. Severe deconditioning biggest barrier  Able to follow commands and nod yes/no to questions Plan Correct metabolic derangements Limit sedating medications   Ventricular tachycardia likely in the setting of acute illness Paroxysmal A-fib/A-flutter Patient takes amiodarone and Xarelto at home. AICD in place. 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 Significant non-pitting edema all extremities. Diuresed well yesterday with lasix. 2.5L out  Plan Lasix 120 q8  today  Resume home GDMT as tolerated Continue ASA  AKI on CKD stage IIIb  Baseline creatinine ~ 2 with recent acute increases up to 2.7 02/2023. Creatinine continues to improve, 2.34 today  Plan Trend BMP Avoid nephrotoxic agents as able Ensure adequate renal perfusion Renal dose meds Strict I&O  Shock liver, improved LFTs continue to downtrend. Plan: Check tomorrow   Fluid and electrolyte imbalance, hypokalemia, NAGMA Potassium 3.7 this am, this is likely to decrease with lasix Plan 4 runs potassium   Trend chems  Diabetes type 2, with hyperglycemia Lantus decreased yesterday due to low/normal abgs. Elevated above goal last night.  Plan SSI Increase lantus to 7 daily Goal  CBG 140-180  Anemia and thrombocytopenia of critical illness  Hgb downtrending. 10.0 today.  Plan Trend H&H, Plt Monitor for signs of active bleeding Transfuse for Hgb < 7.0, Plt < 20K or hemodynamically significant bleeding  Best Practice (right click and "Reselect all SmartList Selections" daily)    Diet/type: NPO, getting tubefeeds DVT prophylaxis IV heparin infusion Pressure ulcer(s): N/A GI prophylaxis: H2B Lines: right internal jugular CVL - 05/20/23 Foley: Yes needed Code Status:  full code Last date of multidisciplinary goals of care discussion: 3/2: Patient's sister was updated over the phone, granddaughter at bedside Asking palliative to eval. Needs GOC discussion   Critical care time: (per attending) note reviewed w/ Deniece Ree ACNP-S  Simonne Martinet ACNP-BC [facility] Pulmonary/Critical Care Pager # [phone] OR # [phone] if no answer

## 2023-05-27 NOTE — Progress Notes (Signed)
 RT placed patient back on bipap due to increased WOB. NP aware. RT will continue to monitor.

## 2023-05-27 NOTE — Progress Notes (Signed)
 BIPAP asleep Cortrack placed yesterday SR this morning, less V ectopy BP stable Continue amiodarone gtt until able to take PO reliably  EP will continue to follow from afar Francis Dowse, PA-C

## 2023-05-27 NOTE — IPAL (Signed)
  Interdisciplinary Goals of Care Family Meeting   Date carried out: 05/27/2023  Location of the meeting: Conference room  Member's involved: Nurse Practitioner and Family Member or next of kin  Durable Power of Attorney or Environmental health practitioner: son and daugher    Discussion: We discussed goals of care for [employer] .  I updated the family about current clinical findings. Clinical decline in spite of aggressive treatment. I talked at length about life support, respiratory failure in context of her heart failure and also my concern about progressive multiorgan failure. I do not think that she would be a dialysis candidate given her advanced cardiac disease so if renal fxn continues to decline then we really do not have much else to offer. I have recommended to family to continue to discuss this matter and should things continue to worsen over weekend we need to re-visit goals of care   Code status:   Code Status: Full Code   Disposition: Continue current acute care  Time spent for the meeting: 34 min     Shelby Mattocks, NP  05/27/2023, 7:24 PM

## 2023-05-28 DIAGNOSIS — J101 Influenza due to other identified influenza virus with other respiratory manifestations: Secondary | ICD-10-CM | POA: Diagnosis not present

## 2023-05-28 DIAGNOSIS — J9602 Acute respiratory failure with hypercapnia: Secondary | ICD-10-CM | POA: Diagnosis not present

## 2023-05-28 DIAGNOSIS — N1832 Chronic kidney disease, stage 3b: Secondary | ICD-10-CM | POA: Diagnosis not present

## 2023-05-28 DIAGNOSIS — I5082 Biventricular heart failure: Secondary | ICD-10-CM

## 2023-05-28 DIAGNOSIS — I480 Paroxysmal atrial fibrillation: Secondary | ICD-10-CM | POA: Diagnosis not present

## 2023-05-28 DIAGNOSIS — E870 Hyperosmolality and hypernatremia: Secondary | ICD-10-CM

## 2023-05-28 DIAGNOSIS — J9601 Acute respiratory failure with hypoxia: Secondary | ICD-10-CM | POA: Diagnosis not present

## 2023-05-28 LAB — BASIC METABOLIC PANEL
Anion gap: 15 (ref 5–15)
Anion gap: 16 — ABNORMAL HIGH (ref 5–15)
BUN: 97 mg/dL — ABNORMAL HIGH (ref 8–23)
BUN: 100 mg/dL — ABNORMAL HIGH (ref 8–23)
CO2: 19 mmol/L — ABNORMAL LOW (ref 22–32)
CO2: 19 mmol/L — ABNORMAL LOW (ref 22–32)
Calcium: 8.5 mg/dL — ABNORMAL LOW (ref 8.9–10.3)
Calcium: 8.4 mg/dL — ABNORMAL LOW (ref 8.9–10.3)
Chloride: 114 mmol/L — ABNORMAL HIGH (ref 98–111)
Chloride: 112 mmol/L — ABNORMAL HIGH (ref 98–111)
Creatinine, Ser: 2.61 mg/dL — ABNORMAL HIGH (ref 0.44–1.00)
Creatinine, Ser: 2.46 mg/dL — ABNORMAL HIGH (ref 0.44–1.00)
GFR, Estimated: 18 mL/min — ABNORMAL LOW (ref >60)
GFR, Estimated: 20 mL/min — ABNORMAL LOW (ref >60)
Glucose, Bld: 211 mg/dL — ABNORMAL HIGH (ref 70–99)
Glucose, Bld: 184 mg/dL — ABNORMAL HIGH (ref 70–99)
Potassium: 4.4 mmol/L (ref 3.5–5.1)
Potassium: 3.8 mmol/L (ref 3.5–5.1)
Sodium: 148 mmol/L — ABNORMAL HIGH (ref 135–145)
Sodium: 147 mmol/L — ABNORMAL HIGH (ref 135–145)

## 2023-05-28 LAB — BLOOD GAS, ARTERIAL
Acid-base deficit: 5.7 mmol/L — ABNORMAL HIGH (ref 0.0–2.0)
Bicarbonate: 19.3 mmol/L — ABNORMAL LOW (ref 20.0–28.0)
O2 Saturation: 96.5 %
Patient temperature: 36
pCO2 arterial: 34 mmHg (ref 32–48)
pH, Arterial: 7.36 (ref 7.35–7.45)
pO2, Arterial: 74 mmHg — ABNORMAL LOW (ref 83–108)

## 2023-05-28 LAB — COMPREHENSIVE METABOLIC PANEL
ALT: 86 U/L — ABNORMAL HIGH (ref 0–44)
AST: 18 U/L (ref 15–41)
Albumin: 1.8 g/dL — ABNORMAL LOW (ref 3.5–5.0)
Alkaline Phosphatase: 84 U/L (ref 38–126)
Anion gap: 15 (ref 5–15)
BUN: 93 mg/dL — ABNORMAL HIGH (ref 8–23)
CO2: 20 mmol/L — ABNORMAL LOW (ref 22–32)
Calcium: 8.2 mg/dL — ABNORMAL LOW (ref 8.9–10.3)
Chloride: 112 mmol/L — ABNORMAL HIGH (ref 98–111)
Creatinine, Ser: 2.39 mg/dL — ABNORMAL HIGH (ref 0.44–1.00)
GFR, Estimated: 21 mL/min — ABNORMAL LOW (ref >60)
Glucose, Bld: 313 mg/dL — ABNORMAL HIGH (ref 70–99)
Potassium: 3.6 mmol/L (ref 3.5–5.1)
Sodium: 147 mmol/L — ABNORMAL HIGH (ref 135–145)
Total Bilirubin: 1.1 mg/dL (ref 0.0–1.2)
Total Protein: 5.3 g/dL — ABNORMAL LOW (ref 6.5–8.1)

## 2023-05-28 LAB — CBC
HCT: 28.9 % — ABNORMAL LOW (ref 36.0–46.0)
Hemoglobin: 10.3 g/dL — ABNORMAL LOW (ref 12.0–15.0)
MCH: 28.5 pg (ref 26.0–34.0)
MCHC: 35.6 g/dL (ref 30.0–36.0)
MCV: 79.8 fL — ABNORMAL LOW (ref 80.0–100.0)
Platelets: 88 10*3/uL — ABNORMAL LOW (ref 150–400)
RBC: 3.62 MIL/uL — ABNORMAL LOW (ref 3.87–5.11)
RDW: 19.4 % — ABNORMAL HIGH (ref 11.5–15.5)
WBC: 21.8 10*3/uL — ABNORMAL HIGH (ref 4.0–10.5)
nRBC: 1.2 % — ABNORMAL HIGH (ref 0.0–0.2)

## 2023-05-28 LAB — PHOSPHORUS: Phosphorus: 4.5 mg/dL (ref 2.5–4.6)

## 2023-05-28 LAB — GLUCOSE, CAPILLARY
Glucose-Capillary: 174 mg/dL — ABNORMAL HIGH (ref 70–99)
Glucose-Capillary: 177 mg/dL — ABNORMAL HIGH (ref 70–99)
Glucose-Capillary: 182 mg/dL — ABNORMAL HIGH (ref 70–99)
Glucose-Capillary: 228 mg/dL — ABNORMAL HIGH (ref 70–99)
Glucose-Capillary: 237 mg/dL — ABNORMAL HIGH (ref 70–99)
Glucose-Capillary: 257 mg/dL — ABNORMAL HIGH (ref 70–99)

## 2023-05-28 LAB — HEPARIN LEVEL (UNFRACTIONATED): Heparin Unfractionated: 0.32 [IU]/mL (ref 0.30–0.70)

## 2023-05-28 LAB — MRSA NEXT GEN BY PCR, NASAL: MRSA by PCR Next Gen: NOT DETECTED

## 2023-05-28 LAB — MAGNESIUM: Magnesium: 2.4 mg/dL (ref 1.7–2.4)

## 2023-05-28 MED ORDER — POTASSIUM CHLORIDE 20 MEQ PO PACK
40.0000 meq | PACK | Freq: Once | ORAL | Status: AC
  Administered 2023-05-28: 40 meq
  Filled 2023-05-28: qty 2

## 2023-05-28 MED ORDER — POTASSIUM CHLORIDE 20 MEQ PO PACK
40.0000 meq | PACK | Freq: Once | ORAL | Status: AC
Start: 2023-05-28 — End: 2023-05-28
  Administered 2023-05-28: 40 meq
  Filled 2023-05-28: qty 2

## 2023-05-28 MED ORDER — AMIODARONE HCL 200 MG PO TABS
200.0000 mg | ORAL_TABLET | Freq: Every day | ORAL | Status: DC
  Administered 2023-05-28 – 2023-05-30 (×3): 200 mg
  Filled 2023-05-28 (×3): qty 1

## 2023-05-28 MED ORDER — ORAL CARE MOUTH RINSE
15.0000 mL | OROMUCOSAL | Status: DC | PRN
  Administered 2023-05-30 (×2): 15 mL via OROMUCOSAL

## 2023-05-28 MED ORDER — PREDNISONE 20 MG PO TABS
30.0000 mg | ORAL_TABLET | Freq: Every day | ORAL | Status: DC
Start: 2023-05-29 — End: 2023-05-29
  Administered 2023-05-29: 30 mg
  Filled 2023-05-28: qty 2

## 2023-05-28 MED ORDER — ATORVASTATIN CALCIUM 80 MG PO TABS
80.0000 mg | ORAL_TABLET | Freq: Every day | ORAL | Status: DC
  Administered 2023-05-28 – 2023-06-04 (×8): 80 mg
  Filled 2023-05-28 (×8): qty 1

## 2023-05-28 MED ORDER — METOLAZONE 5 MG PO TABS
10.0000 mg | ORAL_TABLET | Freq: Two times a day (BID) | ORAL | Status: DC
  Administered 2023-05-28 – 2023-05-30 (×5): 10 mg
  Filled 2023-05-28 (×5): qty 2

## 2023-05-28 MED ORDER — DEXTROSE 5 % IV SOLN
120.0000 mg | Freq: Four times a day (QID) | INTRAVENOUS | Status: AC
  Administered 2023-05-28 – 2023-05-29 (×4): 120 mg via INTRAVENOUS
  Filled 2023-05-28 (×4): qty 10

## 2023-05-28 MED ORDER — FREE WATER
200.0000 mL | Status: DC
  Administered 2023-05-28 – 2023-05-31 (×19): 200 mL

## 2023-05-28 MED ORDER — CARVEDILOL 6.25 MG PO TABS
6.2500 mg | ORAL_TABLET | Freq: Two times a day (BID) | ORAL | Status: DC
  Administered 2023-05-28 – 2023-05-29 (×3): 6.25 mg
  Filled 2023-05-28: qty 2
  Filled 2023-05-28 (×2): qty 1

## 2023-05-28 MED ORDER — INSULIN GLARGINE 100 UNIT/ML ~~LOC~~ SOLN
12.0000 [IU] | Freq: Two times a day (BID) | SUBCUTANEOUS | Status: DC
  Administered 2023-05-28 – 2023-05-31 (×7): 12 [IU] via SUBCUTANEOUS
  Filled 2023-05-28 (×8): qty 0.12

## 2023-05-28 MED ORDER — INSULIN ASPART 100 UNIT/ML IJ SOLN
0.0000 [IU] | INTRAMUSCULAR | Status: DC
  Administered 2023-05-28 – 2023-05-29 (×3): 4 [IU] via SUBCUTANEOUS
  Administered 2023-05-29: 3 [IU] via SUBCUTANEOUS
  Administered 2023-05-29: 7 [IU] via SUBCUTANEOUS
  Administered 2023-05-29: 3 [IU] via SUBCUTANEOUS
  Administered 2023-05-29 – 2023-05-30 (×4): 4 [IU] via SUBCUTANEOUS
  Administered 2023-05-30: 3 [IU] via SUBCUTANEOUS
  Administered 2023-05-30: 11 [IU] via SUBCUTANEOUS
  Administered 2023-05-31: 3 [IU] via SUBCUTANEOUS
  Administered 2023-05-31: 4 [IU] via SUBCUTANEOUS
  Administered 2023-05-31: 7 [IU] via SUBCUTANEOUS
  Administered 2023-05-31: 4 [IU] via SUBCUTANEOUS
  Administered 2023-05-31: 11 [IU] via SUBCUTANEOUS
  Administered 2023-05-31: 7 [IU] via SUBCUTANEOUS
  Administered 2023-06-01: 4 [IU] via SUBCUTANEOUS
  Administered 2023-06-01: 7 [IU] via SUBCUTANEOUS
  Administered 2023-06-01: 4 [IU] via SUBCUTANEOUS
  Administered 2023-06-01: 3 [IU] via SUBCUTANEOUS
  Administered 2023-06-01 – 2023-06-02 (×5): 7 [IU] via SUBCUTANEOUS
  Administered 2023-06-02 – 2023-06-03 (×4): 4 [IU] via SUBCUTANEOUS
  Administered 2023-06-03: 3 [IU] via SUBCUTANEOUS
  Administered 2023-06-03: 7 [IU] via SUBCUTANEOUS
  Administered 2023-06-04: 4 [IU] via SUBCUTANEOUS
  Administered 2023-06-04 (×2): 3 [IU] via SUBCUTANEOUS
  Administered 2023-06-04: 4 [IU] via SUBCUTANEOUS

## 2023-05-28 MED ORDER — CARVEDILOL 12.5 MG PO TABS
12.5000 mg | ORAL_TABLET | Freq: Two times a day (BID) | ORAL | Status: DC
Start: 2023-05-28 — End: 2023-05-28

## 2023-05-28 MED ORDER — ORAL CARE MOUTH RINSE
15.0000 mL | OROMUCOSAL | Status: DC
  Administered 2023-05-28 – 2023-05-30 (×9): 15 mL via OROMUCOSAL

## 2023-05-28 MED ORDER — ALBUMIN HUMAN 25 % IV SOLN
25.0000 g | Freq: Four times a day (QID) | INTRAVENOUS | Status: AC
  Administered 2023-05-28 – 2023-05-29 (×4): 25 g via INTRAVENOUS
  Filled 2023-05-28 (×4): qty 100

## 2023-05-28 NOTE — Progress Notes (Signed)
 Patient off bipap three times .  Remained off for oral care and facial care.  Did not tolerate remaining off extended time due to O2 sat dropping into low 80s

## 2023-05-28 NOTE — Addendum Note (Signed)
 Addended by: Elease Etienne A on: 05/28/2023 09:14 AM   Modules accepted: Orders

## 2023-05-28 NOTE — Plan of Care (Signed)
 Patient remained stable through day.  Sat up edge of bed and off bipap 3 times for oral care and facial care.  Sats slowly fell to low 80s and put back on bipap.  Patient recovered quickly.

## 2023-05-28 NOTE — Inpatient Diabetes Management (Signed)
 Inpatient Diabetes Program Recommendations  AACE/ADA: New Consensus Statement on Inpatient Glycemic Control (2015)  Target Ranges:  Prepandial:   less than 140 mg/dL      Peak postprandial:   less than 180 mg/dL (1-2 hours)      Critically ill patients:  140 - 180 mg/dL   Lab Results  Component Value Date   GLUCAP 237 (H) 05/28/2023   HGBA1C 7.7 (H) 05/20/2023    Review of Glycemic Control  Latest Reference Range & Units 05/27/23 11:35 05/27/23 15:55 05/27/23 19:59 05/28/23 00:50 05/28/23 04:29 05/28/23 08:10  Glucose-Capillary 70 - 99 mg/dL 409 (H) 811 (H) 914 (H) 228 (H) 257 (H) 237 (H)   Diabetes history: DM  Outpatient Diabetes medications:  Tresiba 14 units q HS Novolog 4-5 units tid with meals Current orders for Inpatient glycemic control:  Novolog 0-15 units q 4 hours Vital 1.2 cal- 60 ml/hr Novolog 4 units q 4 hours Solumedrol 40 mg IV daily Lantus 8 units bid  Inpatient Diabetes Program Recommendations:    Consider increasing Lantus to 10 units bid and increase Novolog tube feed coverage to 6 units q 4 hours.   Thanks,  Lorenza Cambridge, RN, BC-ADM Inpatient Diabetes Coordinator Pager [phone]  (8a-5p)

## 2023-05-28 NOTE — Progress Notes (Addendum)
 Poor renal function does not support potassium replacement. Last dose of lasix in progress. BMP for 12PM to re-evaluate potassium level.

## 2023-05-28 NOTE — Evaluation (Signed)
 Physical Therapy Evaluation Patient Details Name: [name] MRN: [id number] DOB: [birth year] Today's Date: 05/28/2023  History of Present Illness  78 y.o. female presents to [facility] 05/20/23 with N/V, abdominal pain, AICD discharge x3. Admitted with acute respiratory failure w/ hypoxia and hypercapnia, +flu, and sepsis 2/2 PNA. Intubated 2/27-3/3. PMH: HTN, diabetes, neuropathy, hypothyroidism, CKD 3B, CAD, GERD, CVA, chronic systolic CHF, depression, anxiety, ventricular tachycardia, ventricular fibrillation, atrial fibrillation, status post ICD, obesity, seizure disorder, low back pain, spinal stenosis   Clinical Impression  Pt in bed upon arrival with son and granddaughter present. PTA, pt would use a quad cane or furniture surf upstairs and would use a rollator in the community. Pt would need assistance for stair negotiation. In today's session, pt was lethargic with increased alertness when sitting on the EOB. Pt was able to follow commands to wiggle toes, ankle DF/PF, and squeeze R hand. Pt required totalAx2 for bed mobility and was unable to assist. When seated EOB, pt required MinA to CGA for seated balance as pt tended to lean laterally to the left. Pt was able to sit EOB for ~5 minutes. Pt currently with functional limitations due to the deficits listed below (see PT Problem List). Pt would benefit from acute skilled PT to address functional impairments. Recommending post-acute rehab <3 hrs to work towards independence with mobility. Acute PT to follow.  Bi-pap, 60%, 8 PEEP, 92% SpO2 BP- 119/67, 77 BPM         If plan is discharge home, recommend the following: A lot of help with walking and/or transfers;A lot of help with bathing/dressing/bathroom;Assistance with cooking/housework;Assist for transportation;Help with stairs or ramp for entrance   Can travel by private vehicle   No    Equipment Recommendations None recommended by PT     Functional Status Assessment Patient has had  a recent decline in their functional status and demonstrates the ability to make significant improvements in function in a reasonable and predictable amount of time.     Precautions / Restrictions Precautions Precautions: Fall Precaution/Restrictions Comments: fecal tube, bi-pap, a-line, CVC IJ line, cortrak Restrictions Weight Bearing Restrictions Per Provider Order: No      Mobility  Bed Mobility Overal bed mobility: Needs Assistance Bed Mobility: Supine to Sit, Sit to Supine    Supine to sit: +2 for physical assistance, +2 for safety/equipment, HOB elevated, Total assist Sit to supine: Total assist, +2 for physical assistance, HOB elevated, +2 for safety/equipment   General bed mobility comments: TotalAx2 with use of bed pad and helicopter method toward EOB. Pt unable to assist with bed mobility. Props on L elbow as pt tends to lean to the left. TotalAx2 for return to supine    Transfers  General transfer comment: deferred         Balance Overall balance assessment: Needs assistance, Mild deficits observed, not formally tested, History of Falls Sitting-balance support: Bilateral upper extremity supported, Feet supported Sitting balance-Leahy Scale: Fair Sitting balance - Comments: MinA to CGA for seated balance, leans to the left with proping on L elbow. Difficulty keeping trunk in midline Postural control: Left lateral lean       Pertinent Vitals/Pain Pain Assessment Pain Assessment: No/denies pain    Home Living Family/patient expects to be discharged to:: Private residence Living Arrangements: Children (son and grandaughter) Available Help at Discharge: Family;Available 24 hours/day Type of Home: House Home Access: Stairs to enter Entrance Stairs-Rails: Right;Left;Can reach both Entrance Stairs-Number of Steps: 4 Alternate Level Stairs-Number of Steps: flight  Home Layout: Two level;Able to live on main level with bedroom/bathroom;Bed/bath upstairs Home  Equipment: Rollator (4 wheels);Wheelchair - manual;Shower seat;Grab bars - tub/shower;Grab bars - toilet;BSC/3in1;Cane - quad      Prior Function Prior Level of Function : History of Falls (last six months);Needs assist       Physical Assist : Mobility (physical);ADLs (physical) Mobility (physical): Stairs ADLs (physical): Dressing;Bathing Mobility Comments: uses quad cane or furniture surfs upstairs. Uses rollator downstairs and in the community. Needed physical assist for stairs. x10 falls due to knees buckling ADLs Comments: needed assist with LB dressing, bathing     Extremity/Trunk Assessment   Upper Extremity Assessment Upper Extremity Assessment: Defer to OT evaluation    Lower Extremity Assessment Lower Extremity Assessment: Generalized weakness;Difficult to assess due to impaired cognition (able to wiggle toes and slightly ankle DF/PF. Able to squeeze R hand)    Cervical / Trunk Assessment Cervical / Trunk Assessment: Kyphotic  Communication   Communication Communication: Impaired Factors Affecting Communication: Other (comment) (bi-pap)    Cognition Arousal: Lethargic Behavior During Therapy: Flat affect   PT - Cognitive impairments: Difficult to assess Difficult to assess due to:  (Bi-pap)    Following commands: Impaired Following commands impaired: Follows one step commands inconsistently     Cueing Cueing Techniques: Verbal cues, Tactile cues     General Comments General comments (skin integrity, edema, etc.): Son and grandaughter present and supportive through session     PT Assessment Patient needs continued PT services  PT Problem List Decreased strength;Decreased range of motion;Decreased activity tolerance;Decreased balance;Decreased mobility;Decreased coordination;Cardiopulmonary status limiting activity;Obesity       PT Treatment Interventions DME instruction;Gait training;Stair training;Functional mobility training;Therapeutic  activities;Therapeutic exercise;Balance training;Neuromuscular re-education;Patient/family education;Wheelchair mobility training    PT Goals (Current goals can be found in the Care Plan section)  Acute Rehab PT Goals Patient Stated Goal: pt unable to state goal PT Goal Formulation: Patient unable to participate in goal setting Time For Goal Achievement: 06/11/23 Potential to Achieve Goals: Fair    Frequency Min 3X/week        AM-PAC PT "6 Clicks" Mobility  Outcome Measure Help needed turning from your back to your side while in a flat bed without using bedrails?: Total Help needed moving from lying on your back to sitting on the side of a flat bed without using bedrails?: Total Help needed moving to and from a bed to a chair (including a wheelchair)?: Total Help needed standing up from a chair using your arms (e.g., wheelchair or bedside chair)?: Total Help needed to walk in hospital room?: Total Help needed climbing 3-5 steps with a railing? : Total 6 Click Score: 6    End of Session Equipment Utilized During Treatment: Oxygen Activity Tolerance: Patient limited by fatigue;Patient limited by lethargy Patient left: in bed;with call bell/phone within reach;with nursing/sitter in room;with family/visitor present Nurse Communication: Mobility status PT Visit Diagnosis: Other abnormalities of gait and mobility (R26.89);Repeated falls (R29.6);Muscle weakness (generalized) (M62.81)    Time: 1610-9604 PT Time Calculation (min) (ACUTE ONLY): 29 min   Charges:   PT Evaluation $PT Eval Moderate Complexity: 1 Mod   PT General Charges $$ ACUTE PT VISIT: 1 Visit       Hilton Cork, PT, DPT Secure Chat Preferred  Rehab Office [phone]   Arturo Morton Brion Aliment 05/28/2023, 9:57 AM

## 2023-05-28 NOTE — Progress Notes (Addendum)
 NAME:  [name], MRN:  [id number], DOB:  11-Sep-1945, LOS: 7 ADMISSION DATE:  05/20/2023, CONSULTATION DATE:  05/20/2023 REFERRING MD:  Alinda Money - [facility] CHIEF COMPLAINT:  N/V/Abd pain / Resp failure   History of Present Illness:  78 year old female with medical history significant of hypertension, hyperlipidemia, diabetes, neuropathy, hypothyroidism, CKD 3B, CAD, GERD, CVA, chronic systolic CHF, depression, anxiety, ventricular tachycardia, ventricular fibrillation, atrial fibrillation, status post ICD, obesity, seizure disorder, low back pain, spinal stenosis presenting after feeling unwell for several days and recent ICD discharges.   Patient reports that she has been unwell for possible days.  She is experience nausea vomiting and abdominal pain.  She reports that her defibrillator fired multiple times today.  ED provider confirmed this with the [facility] representative who noted that the device did fire 3 times a day for ventricular fibrillation.  Also noted periods of slow ventricular tachycardia as well as atrial fibrillation.   Patient denies fevers, chills.   ED Course: Vital signs in the ED notable for blood pressure initially in the 70 systolic now improved to the 120s.  Respiratory rate in the 20s.  Requiring 3 L to maintain saturations.  Lab workup included CMP with potassium 3.2, bicarb 18, gap 17, BUN 32, creatinine elevated 2.37 baseline of 2, glucose 2 1, calcium 7.9, protein 6.0, albumin 2.5, T. bili 1.9, AST 1076, ALT 663.  CBC with platelets 142.  BNP elevated to 2548.  Troponin trend 339, 417.  Lipase normal.  Respiratory panel positive for flu A.  Lactic acid pending.  Urinalysis pending.  Blood cultures pending.  VBG with normal pH pCO2 40.7.  Magnesium pending.  Chest x-ray with new bilateral opacities consistent with pneumonia.  CT of the abdomen pelvis showing no acute normality in the abdomen pelvis, bilateral opacity at the lung bases, left renal cysts.  Patient received  ceftriaxone, doxycycline, Tamiflu in the ED.  Started on amiodarone infusion.  Also received morphine, 1 L IV fluids and 40 mEq p.o. potassium x 2.  Cardiology consulted and recommended amiodarone fusion, repletion of magnesium and potassium as needed, adding back beta-blocker as blood pressure allows.  Pertinent Medical History:   has a past medical history of Acute kidney injury superimposed on chronic kidney disease (HCC) (10/02/2022), Allergy, Arthritis, Atrial fibrillation (HCC), Chronic renal insufficiency, Chronic systolic CHF (congestive heart failure) (HCC) (03/20/2013), Chronic systolic heart failure (HCC), Coronary artery disease (01/31/2011), Diabetes mellitus, 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, History of chicken pox, Hyperlipidemia, Hypertension, Ischemic cardiomyopathy, Knee MCL sprain (03/28/2014), Left knee pain (08/30/2012), Lumbar spondylosis (01/11/2012), Pes anserine bursitis (02/23/2014), Sleep apnea, Stroke (HCC) (2010), Suicide attempt by benzodiazepine overdose (HCC) (01/21/2020), UTI (urinary tract infection) (08/03/2013), and Ventricular tachycardia (HCC).   Significant Hospital Events: Including procedures, antibiotic start and stop dates in addition to other pertinent events   2/27 Presented to ED feeling unwell, s/p AICD discharge x 3 for VT/slow VT. Hypotensive with increased WOB prompting intubation. Flu A+,  CXR c/w PNA. 2/28 Remains on high-dose vasopressor support with Levophed. No more episodes of V. Tach. Remain acidotic mixed metabolic and respiratory. 3/1 Remains on ventilator with FiO2 50 PEEP 8, remains on both levo and vaso 3/3 extubated to bipap 3/4 treated w/ lasix still on NIPPV 3/5 inc'd WOB. CXR still w/ marked bilateral airspace disease. ABG w/ sig hypoxia and resp alk. PEEP inc'd on NIPPV. Increased lasix. Widened ABX coverage due to clinical decline  3/6 attempting short rotations of HHFNC but still w/  marked WOB.  Goals of care discussion  with family they continued to request aggressive care.  Shared with them my concern about limiting factor would be renal function, given underlying cardiomyopathy she would be a poor candidate for dialysis 3/7 renal function a little worse CVP 6, using albumin and Lasix to try to mobilize total body overload still BiPAP dependent  Interim History / Subjective:  Looks about the same  Objective:  Blood pressure 132/62, pulse 91, temperature (!) 97 F (36.1 C), temperature source Bladder, resp. rate (!) 22, height 5\' 2"  (1.575 m), weight 93 kg, SpO2 95%. CVP:  [4 mmHg-19 mmHg] 6 mmHg  Vent Mode: BIPAP;PCV FiO2 (%):  [60 %-80 %] 60 % Set Rate:  [12 bmp] 12 bmp PEEP:  [8 cmH20] 8 cmH20   Intake/Output Summary (Last 24 hours) at 05/28/2023 0933 Last data filed at 05/28/2023 0831 Gross per 24 hour  Intake 2461.31 ml  Output 3155 ml  Net -693.69 ml   Filed Weights   05/26/23 0703 05/27/23 0500 05/28/23 0500  Weight: 96.3 kg 95 kg 93 kg   Physical Examination: General critically ill 78 year old female patient who remains on noninvasive positive pressure ventilation she is extremely weak with ongoing accessory use HEENT normocephalic atraumatic BiPAP mask in place Pulmonary: Coarse scattered rhonchi, diminished in the bases Cardiac regular rate and rhythm Abdomen: Soft nontender Extremities: Warm dry with diffuse anasarca GU clear yellow Neuro: Awake, interactive, will follow commands, was able to sit at side of the bed with assist with BiPAP in place very weak.  Still work of breathing limits ability to phonate and talk Resolved Hospital Problem List:  Septic shock Shock liver, improved Assessment & Plan:  Acute respiratory failure with hypoxia and hypercapnia due to  Influenza pneumonia (tamiflu completed) and Bilateral superimposed bacterial pneumonia.  As of 3/7 further complicated by pulmonary edema and concern for HCAP.  She is now on day 5 NIPPV really has not made much improvement  Long discussion with family on 3/6 who indicates they still desire aggressive care including mechanical ventilation at this point if needed Plan:  Supplemental O2 support for sat > 90% Trial off bipap today for short periods of time w/ heated high flow  Change Solu-Medrol to prednisone via tube Continue cefepime and vanc  day 3, High risk for reintubation Obtain sputum cx if ends up reintubated Lasix every 6 hours 120 mg after 25 g of albumin Scheduled metolazone A.m. chest x-ray  Sepsis secondary to bilateral bacterial pneumonia  Leukocytosis WBC stable today. Off pressors Plan Goal MAP > 65 Antibiotics as above  Acute septic encephalopathy - improving. Severe deconditioning biggest barrier  Plan Correct metabolic derangements Limit sedating medications   Ventricular tachycardia likely in the setting of acute illness Paroxysmal A-fib/A-flutter Patient takes amiodarone and Xarelto at home. AICD in place. EP/Cardiology following Plan Cardiac monitoring Optimize electrolytes for K > 4, Mg > 2 Transition amiodarone to oral via tube, with her home dosing Add back carvedilol at lower dosing Continue heparin gtt, transition to DOAC when more stable and can take POs  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 Echo 12/2022 EF 35-40% with global hypokinesis and grade 1 diastolic dysfunction. Significant non-pitting edema all extremities. Diuresed well yesterday with lasix. 2.5L out  Plan Resume home GDMT as tolerated Add back carvedilol at half of her home dosing Continue ASA  AKI on CKD stage IIIb  Baseline creatinine ~ 2.  Serum creatinine bumped a little bit today with aggressive  diuresis.  According to central venous pressure likely close to euvolemic but she still massively overloaded Plan See pulmonary section regarding diuretics Avoid nephrotoxic agents as able Ensure adequate renal perfusion Renal dose meds Strict I&O Change chemistry to watch every 12  hours (5a and 5p)  Fluid and electrolyte imbalance, hypokalemia, NAGMA, hypernatremia in the setting of diuresis She has total body overload, however I think vascular status is euvolemic if not dry Plan Adding free water replacement Empirically replacing potassium Serial chemistries  Diabetes type 2, with hyperglycemia Lantus decreased yesterday due to low/normal abgs. Elevated above goal last night.  Plan SSI, increase to resistant  Increase lantus to 12 units bid  Goal CBG 140-180  Anemia and thrombocytopenia of critical illness  Hgb downtrending. 10.0 today.  Plan Trend H&H, Plt Monitor for signs of active bleeding Transfuse for Hgb < 7   Best Practice (right click and "Reselect all SmartList Selections" daily)   Diet/type: NPO, getting tubefeeds DVT prophylaxis IV heparin infusion Pressure ulcer(s): N/A GI prophylaxis: H2B Lines: right internal jugular CVL - 05/20/23 Foley: Yes needed Code Status:  full code Last date of multidisciplinary goals of care discussion: 3/6 with entire family.  They continue to request aggressive care  Critical care time: 45 min

## 2023-05-28 NOTE — Progress Notes (Signed)
 SR/PAFib, 60's-80's, occ WC beats, these are slow also 60's-80, short, irregular No VT BP stable Remains on BIPAP D/w bedside RN, more interactive with family Plans to try and OOB, wean BIPAP if able Fluid +, making urine  CCM managing EP following chart/tele/periphery Remains on amio gtt > continue until able to take PO GOC discussions started  Francis Dowse, [place]

## 2023-05-28 NOTE — Progress Notes (Signed)
 Remote ICD transmission.

## 2023-05-28 NOTE — Progress Notes (Signed)
 PHARMACY - ANTICOAGULATION CONSULT NOTE  Pharmacy Consult for heparin  Indication: atrial fibrillation  Allergies  Allergen Reactions   Veltassa [Patiromer] Diarrhea   Aldactone [Spironolactone] Diarrhea and Nausea And Vomiting   Darvon [Propoxyphene] Other (See Comments)    Indigestion    Mexitil [Mexiletine] Nausea And Vomiting and Other (See Comments)    Tremors Difficulty walking Blurred vision   Phenergan [Promethazine Hcl] Other (See Comments)    Hyperactivity    Ranexa [Ranolazine Er] Diarrhea   Plavix [Clopidogrel Bisulfate] Rash    Patient Measurements: Height: 5\' 2"  (157.5 cm) Weight: 93 kg (205 lb 0.4 oz) IBW/kg (Calculated) : 50.1 HEPARIN DW (KG): 68.6   Vital Signs: Temp: 97 F (36.1 C) (03/07 0400) Temp Source: Bladder (03/07 0400) BP: 132/62 (03/07 0800) Pulse Rate: 91 (03/07 0800)  Labs: Recent Labs    05/26/23 0347 05/26/23 0634 05/26/23 1125 05/27/23 0403 05/27/23 1556 05/28/23 0422  HGB 10.7*   < > 11.9* 10.0*  --  10.3*  HCT 30.7*   < > 35.0* 28.5*  --  28.9*  PLT 100*  --   --  107*  --  88*  HEPARINUNFRC 0.35  --   --  0.35  --  0.32  CREATININE 2.40*  --   --  2.34* 2.37* 2.39*   < > = values in this interval not displayed.    Estimated Creatinine Clearance: 21.3 mL/min (A) (by C-G formula based on SCr of 2.39 mg/dL (H)).   Medical History: Past Medical History:  Diagnosis Date   Acute kidney injury superimposed on chronic kidney disease (HCC) 10/02/2022   Allergy    Arthritis    Atrial fibrillation (HCC)    controlled with amiodarone, on coumadin   Chronic renal insufficiency    Chronic systolic CHF (congestive heart failure) (HCC) 03/20/2013   Chronic systolic heart failure (HCC)    Coronary artery disease 01/31/2011   Diabetes mellitus    type 1   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    History of chicken pox    Hyperlipidemia    Hypertension    Ischemic cardiomyopathy    Knee MCL sprain 03/28/2014   Left  knee pain 08/30/2012   Lumbar spondylosis 01/11/2012   Pes anserine bursitis 02/23/2014   Sleep apnea    Stroke (HCC) 2010   eye doctor said she had TIA   Suicide attempt by benzodiazepine overdose (HCC) 01/21/2020   UTI (urinary tract infection) 08/03/2013   Ventricular tachycardia (HCC)    Polymorphic    Medications:  Medications Prior to Admission  Medication Sig Dispense Refill Last Dose/Taking   amiodarone (PACERONE) 200 MG tablet Take 1 tablet (200 mg total) by mouth daily. 30 tablet 6 05/20/2023 Morning   aspirin EC 81 MG tablet Take 1 tablet (81 mg total) by mouth daily. Swallow whole. 30 tablet 12 05/20/2023 Morning   busPIRone (BUSPAR) 5 MG tablet Take 1 tablet (5 mg total) by mouth 2 (two) times daily. 60 tablet 1 05/20/2023 Morning   carvedilol (COREG) 25 MG tablet TAKE 1 TABLET (25 MG TOTAL) BY MOUTH TWICE A DAY WITH MEALS 180 tablet 3 05/20/2023 Morning   Cholecalciferol (VITAMIN D-3 PO) Take 1 tablet by mouth daily.   05/20/2023 Morning   dicyclomine (BENTYL) 10 MG capsule TAKE 1 CAPSULE (10 MG TOTAL) BY MOUTH 4 (FOUR) TIMES DAILY BEFORE MEALS AND AT BEDTIME. 120 capsule 1 05/20/2023 Morning   furosemide (LASIX) 20 MG tablet Take 2 tablets (40 mg) by mouth  once a day. 60 tablet 11 05/20/2023 Morning   gabapentin (NEURONTIN) 100 MG capsule Take 200 mg by mouth 2 (two) times daily.   05/20/2023 Morning   insulin aspart (NOVOLOG FLEXPEN) 100 UNIT/ML FlexPen Inject up to 15 units daily under skin as advised (Patient taking differently: Inject 4-5 Units into the skin 3 (three) times daily before meals. Inject up to 15 units daily under skin as advised) 15 mL 11 Past Week   insulin degludec (TRESIBA FLEXTOUCH) 100 UNIT/ML FlexTouch Pen Inject 12-14 Units into the skin daily. (Patient taking differently: Inject 14 Units into the skin at bedtime.) 9 mL 3 05/19/2023 Bedtime   levETIRAcetam (KEPPRA XR) 500 MG 24 hr tablet Take 1 tablet (500 mg total) by mouth at bedtime. 90 tablet 3 05/19/2023  Bedtime   losartan (COZAAR) 25 MG tablet Take 25 mg by mouth daily.   05/20/2023 Morning   Multiple Vitamins-Minerals (EYE VITAMINS PO) Take 1 tablet by mouth 2 (two) times daily.   05/20/2023 Morning   nitroGLYCERIN (NITROSTAT) 0.4 MG SL tablet Place 1 tablet (0.4 mg total) under the tongue every 5 (five) minutes as needed for chest pain. 25 tablet 3 Past Month   omeprazole (PRILOSEC) 20 MG capsule Take 1 capsule by mouth daily.   05/20/2023 Morning   Rivaroxaban (XARELTO) 15 MG TABS tablet Take 1 tablet (15 mg total) by mouth daily with supper. (Patient taking differently: Take 15 mg by mouth at bedtime.) 30 tablet 6 05/19/2023 Bedtime   rosuvastatin (CRESTOR) 40 MG tablet Take 1 tablet (40 mg total) by mouth daily. NEEDS FOLLOW UP APPOINTMENT FOR MORE REFILLS (Patient taking differently: Take 40 mg by mouth at bedtime.) 90 tablet 0 05/19/2023 Bedtime   sertraline (ZOLOFT) 50 MG tablet Take 50 mg by mouth at bedtime.   05/19/2023 Bedtime   Continuous Glucose Sensor (FREESTYLE LIBRE 3 SENSOR) MISC 1 each by Does not apply route every 14 (fourteen) days. 6 each 3    magnesium oxide (MAG-OX) 400 (240 Mg) MG tablet TAKE 1 TABLET BY MOUTH TWICE A DAY (Patient not taking: Reported on 05/20/2023) 180 tablet 1 Not Taking   Scheduled:   aspirin  81 mg Per Tube Daily   bisacodyl  10 mg Rectal Once   busPIRone  5 mg Per Tube BID   Chlorhexidine Gluconate Cloth  6 each Topical Daily   famotidine  20 mg Per Tube Daily   gabapentin  200 mg Per Tube Q12H   insulin aspart  0-20 Units Subcutaneous Q4H   insulin aspart  4 Units Subcutaneous Q4H   insulin glargine  12 Units Subcutaneous BID   ipratropium-albuterol  3 mL Nebulization Q6H   methylPREDNISolone (SOLU-MEDROL) injection  40 mg Intravenous Daily   mouth rinse  15 mL Mouth Rinse 4 times per day   polyethylene glycol  17 g Per Tube BID   sennosides  10 mL Per Tube BID   sertraline  50 mg Per Tube QHS   thiamine  100 mg Per Tube Daily    Assessment: 78  yo female here with VT and ICD shock noted with, VDRF and AKI. 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 Pharmacy consulted to dose heparin.   Heparin level is therapeutic at 0.32, on 1000 units/hr. Hgb 10.3, plt  trending down to 88. No s/sx of bleeding or infusion issues. Running peripherally and drawn from A-line.  Goal  of Therapy:  Heparin level 0.3-0.7 units/ml aPTT 66-102 seconds Monitor platelets by anticoagulation protocol: Yes   Plan:  Continue IV heparin infusion at 1000 units/hr. Daily heparin level, and CBC.  Thank you for allowing pharmacy to participate in this patient's care,  Sherron Monday, PharmD, BCCCP Clinical Pharmacist  Phone: [phone] 05/28/2023 9:46 AM  Please check AMION for all [facility] phone numbers After 10:00 PM, call Main Pharmacy [phone]

## 2023-05-29 ENCOUNTER — Inpatient Hospital Stay (HOSPITAL_COMMUNITY)

## 2023-05-29 DIAGNOSIS — J1100 Influenza due to unidentified influenza virus with unspecified type of pneumonia: Secondary | ICD-10-CM | POA: Diagnosis not present

## 2023-05-29 DIAGNOSIS — Z515 Encounter for palliative care: Secondary | ICD-10-CM | POA: Diagnosis not present

## 2023-05-29 DIAGNOSIS — E1165 Type 2 diabetes mellitus with hyperglycemia: Secondary | ICD-10-CM | POA: Diagnosis not present

## 2023-05-29 DIAGNOSIS — J9602 Acute respiratory failure with hypercapnia: Secondary | ICD-10-CM | POA: Diagnosis not present

## 2023-05-29 DIAGNOSIS — Z7189 Other specified counseling: Secondary | ICD-10-CM

## 2023-05-29 DIAGNOSIS — D696 Thrombocytopenia, unspecified: Secondary | ICD-10-CM

## 2023-05-29 DIAGNOSIS — I4891 Unspecified atrial fibrillation: Secondary | ICD-10-CM | POA: Diagnosis not present

## 2023-05-29 DIAGNOSIS — J9601 Acute respiratory failure with hypoxia: Secondary | ICD-10-CM | POA: Diagnosis not present

## 2023-05-29 DIAGNOSIS — R609 Edema, unspecified: Secondary | ICD-10-CM | POA: Diagnosis not present

## 2023-05-29 DIAGNOSIS — N184 Chronic kidney disease, stage 4 (severe): Secondary | ICD-10-CM

## 2023-05-29 DIAGNOSIS — Z452 Encounter for adjustment and management of vascular access device: Secondary | ICD-10-CM | POA: Diagnosis not present

## 2023-05-29 DIAGNOSIS — I5020 Unspecified systolic (congestive) heart failure: Secondary | ICD-10-CM

## 2023-05-29 DIAGNOSIS — J101 Influenza due to other identified influenza virus with other respiratory manifestations: Secondary | ICD-10-CM | POA: Diagnosis not present

## 2023-05-29 DIAGNOSIS — R0603 Acute respiratory distress: Secondary | ICD-10-CM | POA: Diagnosis not present

## 2023-05-29 LAB — BASIC METABOLIC PANEL
Anion gap: 13 (ref 5–15)
Anion gap: 15 (ref 5–15)
BUN: 108 mg/dL — ABNORMAL HIGH (ref 8–23)
BUN: 120 mg/dL — ABNORMAL HIGH (ref 8–23)
CO2: 20 mmol/L — ABNORMAL LOW (ref 22–32)
CO2: 21 mmol/L — ABNORMAL LOW (ref 22–32)
Calcium: 8.4 mg/dL — ABNORMAL LOW (ref 8.9–10.3)
Calcium: 8.9 mg/dL (ref 8.9–10.3)
Chloride: 112 mmol/L — ABNORMAL HIGH (ref 98–111)
Chloride: 112 mmol/L — ABNORMAL HIGH (ref 98–111)
Creatinine, Ser: 2.52 mg/dL — ABNORMAL HIGH (ref 0.44–1.00)
Creatinine, Ser: 2.55 mg/dL — ABNORMAL HIGH (ref 0.44–1.00)
GFR, Estimated: 19 mL/min — ABNORMAL LOW (ref >60)
GFR, Estimated: 19 mL/min — ABNORMAL LOW (ref >60)
Glucose, Bld: 228 mg/dL — ABNORMAL HIGH (ref 70–99)
Glucose, Bld: 134 mg/dL — ABNORMAL HIGH (ref 70–99)
Potassium: 4.1 mmol/L (ref 3.5–5.1)
Potassium: 4.3 mmol/L (ref 3.5–5.1)
Sodium: 145 mmol/L (ref 135–145)
Sodium: 148 mmol/L — ABNORMAL HIGH (ref 135–145)

## 2023-05-29 LAB — HEPARIN LEVEL (UNFRACTIONATED)
Heparin Unfractionated: 0.19 [IU]/mL — ABNORMAL LOW (ref 0.30–0.70)
Heparin Unfractionated: 0.19 [IU]/mL — ABNORMAL LOW (ref 0.30–0.70)

## 2023-05-29 LAB — CBC
HCT: 24.8 % — ABNORMAL LOW (ref 36.0–46.0)
Hemoglobin: 8.6 g/dL — ABNORMAL LOW (ref 12.0–15.0)
MCH: 28.2 pg (ref 26.0–34.0)
MCHC: 34.7 g/dL (ref 30.0–36.0)
MCV: 81.3 fL (ref 80.0–100.0)
Platelets: 72 10*3/uL — ABNORMAL LOW (ref 150–400)
RBC: 3.05 MIL/uL — ABNORMAL LOW (ref 3.87–5.11)
RDW: 19.9 % — ABNORMAL HIGH (ref 11.5–15.5)
WBC: 17.1 10*3/uL — ABNORMAL HIGH (ref 4.0–10.5)
nRBC: 1.1 % — ABNORMAL HIGH (ref 0.0–0.2)

## 2023-05-29 LAB — PHOSPHORUS: Phosphorus: 4.2 mg/dL (ref 2.5–4.6)

## 2023-05-29 LAB — PROCALCITONIN: Procalcitonin: 3.21 ng/mL

## 2023-05-29 LAB — GLUCOSE, CAPILLARY
Glucose-Capillary: 145 mg/dL — ABNORMAL HIGH (ref 70–99)
Glucose-Capillary: 153 mg/dL — ABNORMAL HIGH (ref 70–99)
Glucose-Capillary: 169 mg/dL — ABNORMAL HIGH (ref 70–99)
Glucose-Capillary: 197 mg/dL — ABNORMAL HIGH (ref 70–99)
Glucose-Capillary: 204 mg/dL — ABNORMAL HIGH (ref 70–99)

## 2023-05-29 LAB — MAGNESIUM: Magnesium: 2.1 mg/dL (ref 1.7–2.4)

## 2023-05-29 MED ORDER — PANTOPRAZOLE SODIUM 40 MG IV SOLR
40.0000 mg | Freq: Every day | INTRAVENOUS | Status: DC
  Administered 2023-05-29 – 2023-06-03 (×6): 40 mg via INTRAVENOUS
  Filled 2023-05-29 (×6): qty 10

## 2023-05-29 MED ORDER — FUROSEMIDE 10 MG/ML IJ SOLN
120.0000 mg | Freq: Four times a day (QID) | INTRAVENOUS | Status: DC
  Administered 2023-05-29 – 2023-05-30 (×4): 120 mg via INTRAVENOUS
  Filled 2023-05-29: qty 12
  Filled 2023-05-29 (×3): qty 10
  Filled 2023-05-29: qty 12
  Filled 2023-05-29: qty 10

## 2023-05-29 MED ORDER — ALBUMIN HUMAN 25 % IV SOLN
25.0000 g | Freq: Four times a day (QID) | INTRAVENOUS | Status: AC
  Administered 2023-05-29 – 2023-05-30 (×3): 25 g via INTRAVENOUS
  Administered 2023-05-30: 12.5 g via INTRAVENOUS
  Filled 2023-05-29 (×4): qty 100

## 2023-05-29 MED ORDER — PREDNISONE 20 MG PO TABS
10.0000 mg | ORAL_TABLET | Freq: Every day | ORAL | Status: DC
  Administered 2023-05-30 – 2023-06-01 (×3): 10 mg
  Filled 2023-05-29 (×3): qty 1

## 2023-05-29 NOTE — Evaluation (Signed)
 Occupational Therapy Evaluation Patient Details Name: [name] MRN: [id number] DOB: 1945/06/28 Today's Date: 05/29/2023   History of Present Illness   78 y.o. female presents to [facility] 05/20/23 with N/V, abdominal pain, AICD discharge x3. Admitted with acute respiratory failure w/ hypoxia and hypercapnia, +flu, and sepsis 2/2 PNA. Intubated 2/27-3/3. PMH: HTN, diabetes, neuropathy, hypothyroidism, CKD 3B, CAD, GERD, CVA, chronic systolic CHF, depression, anxiety, ventricular tachycardia, ventricular fibrillation, atrial fibrillation, status post ICD, obesity, seizure disorder, low back pain, spinal stenosis     Clinical Impressions Per chart review, pt using quad cane and rollator for mobility,multiple falls at home, assist for LB dressing and bathing tasks. Pt lives with family. Pt currently lethargic, keeping eyes closed for majority of the session, nodding head yes/no intermittently to questions asked. Pt able to wiggle toes/ankles. No AROM of BUE noted, mod edema to BUE, elevated on 2 pillows each at end of session. Pt presenting with impairments listed below, will follow acutely. Patient will benefit from continued inpatient follow up therapy, <3 hours/day to maximize safety/ind with ADL/funcitonal mobility.      If plan is discharge home, recommend the following:   Two people to help with walking and/or transfers;Two people to help with bathing/dressing/bathroom;Assistance with cooking/housework;Assistance with feeding;Direct supervision/assist for medications management;Direct supervision/assist for financial management;Assist for transportation;Help with stairs or ramp for entrance     Functional Status Assessment   Patient has had a recent decline in their functional status and demonstrates the ability to make significant improvements in function in a reasonable and predictable amount of time.     Equipment Recommendations   Other (comment) (defer)     Recommendations  for Other Services   PT consult     Precautions/Restrictions   Precautions Precautions: Fall Precaution/Restrictions Comments: fecal tube, bi-pap, a-line, CVC IJ line, cortrak Restrictions Weight Bearing Restrictions Per Provider Order: No     Mobility Bed Mobility               General bed mobility comments: deferred due lethargy/poor command follow,will need +2 assist    Transfers                   General transfer comment: deferred      Balance                                           ADL either performed or assessed with clinical judgement   ADL                                         General ADL Comments: dependent for all ADLS at this time     Vision   Additional Comments: needs further assessment     Perception Perception: Not tested       Praxis Praxis: Not tested       Pertinent Vitals/Pain Pain Assessment Pain Assessment: Faces Pain Score: 0-No pain Pain Intervention(s): Monitored during session     Extremity/Trunk Assessment Upper Extremity Assessment Upper Extremity Assessment: Generalized weakness (no AROM noted, PROM limited by moderate edema)   Lower Extremity Assessment Lower Extremity Assessment: Defer to PT evaluation (wiggles bil toes and PF/DF to bil ankles)       Communication Communication Communication: Impaired Factors Affecting Communication: Other (comment) (bipap)  Cognition Arousal: Lethargic Behavior During Therapy: Flat affect Cognition: Difficult to assess Difficult to assess due to: Level of arousal, Impaired communication           OT - Cognition Comments: pt dozing off frequently during session, follows single step commands intermittently, on bipap--nods yes/no during session                 Following commands: Impaired Following commands impaired: Follows one step commands inconsistently     Cueing  General Comments   Cueing Techniques:  Verbal cues;Tactile cues  VSS   Exercises     Shoulder Instructions      Home Living Family/patient expects to be discharged to:: Private residence Living Arrangements: Children Available Help at Discharge: Family;Available 24 hours/day Type of Home: House Home Access: Stairs to enter [employer] of Steps: 4 Entrance Stairs-Rails: Right;Left;Can reach both Home Layout: Two level;Able to live on main level with bedroom/bathroom;Bed/bath upstairs Alternate Level Stairs-Number of Steps: flight Alternate Level Stairs-Rails: Right Bathroom Shower/Tub: Tub/shower unit;Walk-in shower   Bathroom Toilet: Standard Bathroom Accessibility: Yes   Home Equipment: Rollator (4 wheels);Wheelchair - manual;Shower seat;Grab bars - tub/shower;Grab bars - toilet;BSC/3in1;Cane - quad   Additional Comments: info taken from PT eval,pt unable to provide PLOF upon eval      Prior Functioning/Environment Prior Level of Function : History of Falls (last six months);Needs assist         Mobility (physical): Stairs ADLs (physical): Dressing;Bathing Mobility Comments: uses quad cane or furniture surfs upstairs. Uses rollator downstairs and in the community. Needed physical assist for stairs. x10 falls due to knees buckling ADLs Comments: needed assist with LB dressing, bathing    OT Problem List: Decreased strength;Decreased range of motion;Decreased activity tolerance;Impaired balance (sitting and/or standing);Decreased cognition;Decreased safety awareness;Cardiopulmonary status limiting activity;Decreased coordination;Impaired UE functional use;Increased edema   OT Treatment/Interventions: Self-care/ADL training;Therapeutic exercise;Energy conservation;DME and/or AE instruction;Therapeutic activities;Patient/family education;Balance training;Manual therapy;Cognitive remediation/compensation      OT Goals(Current goals can be found in the care plan section)   Acute Rehab OT  Goals Patient Stated Goal: none stated OT Goal Formulation: With patient Time For Goal Achievement: 06/12/23 Potential to Achieve Goals: Good ADL Goals Pt Will Perform Grooming: bed level;with supervision;sitting Pt/caregiver will Perform Home Exercise Program: Increased ROM;Increased strength;Both right and left upper extremity;With minimal assist Additional ADL Goal #1: pt will perform bed mobility mod A in prep for ADLs Additional ADL Goal #2: pt will follow 2 step command in prep for ADLs   OT Frequency:  Min 1X/week    Co-evaluation              AM-PAC OT "6 Clicks" Daily Activity     Outcome Measure Help from another person eating meals?: Total Help from another person taking care of personal grooming?: Total Help from another person toileting, which includes using toliet, bedpan, or urinal?: Total Help from another person bathing (including washing, rinsing, drying)?: Total Help from another person to put on and taking off regular upper body clothing?: Total Help from another person to put on and taking off regular lower body clothing?: Total 6 Click Score: 6   End of Session Nurse Communication: Mobility status  Activity Tolerance: Patient limited by fatigue;Patient limited by lethargy Patient left: in bed;with call bell/phone within reach;with bed alarm set  OT Visit Diagnosis: Unsteadiness on feet (R26.81);Other abnormalities of gait and mobility (R26.89);Muscle weakness (generalized) (M62.81)                Time:  1000-1014 OT Time Calculation (min): 14 min Charges:  OT General Charges $OT Visit: 1 Visit OT Evaluation $OT Eval Low Complexity: 1 Low  [name], OTD, OTR/L SecureChat Preferred Acute Rehab [phone]   [name] 05/29/2023, 11:28 AM

## 2023-05-29 NOTE — Progress Notes (Signed)
 PHARMACY - ANTICOAGULATION CONSULT NOTE  Pharmacy Consult for heparin  Indication: atrial fibrillation  Allergies  Allergen Reactions   Veltassa [Patiromer] Diarrhea   Aldactone [Spironolactone] Diarrhea and Nausea And Vomiting   Darvon [Propoxyphene] Other (See Comments)    Indigestion    Mexitil [Mexiletine] Nausea And Vomiting and Other (See Comments)    Tremors Difficulty walking Blurred vision   Phenergan [Promethazine Hcl] Other (See Comments)    Hyperactivity    Ranexa [Ranolazine Er] Diarrhea   Plavix [Clopidogrel Bisulfate] Rash    Patient Measurements: Height: 5\' 2"  (157.5 cm) Weight: 93 kg (205 lb 0.4 oz) IBW/kg (Calculated) : 50.1 HEPARIN DW (KG): 68.6   Vital Signs: Temp: 97.3 F (36.3 C) (03/08 0400) Temp Source: Bladder (03/08 0000) BP: 108/55 (03/08 0400) Pulse Rate: 85 (03/08 0400)  Labs: Recent Labs    05/27/23 0403 05/27/23 1556 05/28/23 0422 05/28/23 1109 05/28/23 1731 05/29/23 0416  HGB 10.0*  --  10.3*  --   --  8.6*  HCT 28.5*  --  28.9*  --   --  24.8*  PLT 107*  --  88*  --   --  72*  HEPARINUNFRC 0.35  --  0.32  --   --  0.19*  CREATININE 2.34*   < > 2.39* 2.61* 2.46* 2.52*   < > = values in this interval not displayed.    Estimated Creatinine Clearance: 20.2 mL/min (A) (by C-G formula based on SCr of 2.52 mg/dL (H)).   Medical History: Past Medical History:  Diagnosis Date   Acute kidney injury superimposed on chronic kidney disease (HCC) 10/02/2022   Allergy    Arthritis    Atrial fibrillation (HCC)    controlled with amiodarone, on coumadin   Chronic renal insufficiency    Chronic systolic CHF (congestive heart failure) (HCC) 03/20/2013   Chronic systolic heart failure (HCC)    Coronary artery disease 01/31/2011   Diabetes mellitus    type 1   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    History of chicken pox    Hyperlipidemia    Hypertension    Ischemic cardiomyopathy    Knee MCL sprain 03/28/2014   Left  knee pain 08/30/2012   Lumbar spondylosis 01/11/2012   Pes anserine bursitis 02/23/2014   Sleep apnea    Stroke (HCC) 2010   eye doctor said she had TIA   Suicide attempt by benzodiazepine overdose (HCC) 01/21/2020   UTI (urinary tract infection) 08/03/2013   Ventricular tachycardia (HCC)    Polymorphic    Medications:  Medications Prior to Admission  Medication Sig Dispense Refill Last Dose/Taking   amiodarone (PACERONE) 200 MG tablet Take 1 tablet (200 mg total) by mouth daily. 30 tablet 6 05/20/2023 Morning   aspirin EC 81 MG tablet Take 1 tablet (81 mg total) by mouth daily. Swallow whole. 30 tablet 12 05/20/2023 Morning   busPIRone (BUSPAR) 5 MG tablet Take 1 tablet (5 mg total) by mouth 2 (two) times daily. 60 tablet 1 05/20/2023 Morning   carvedilol (COREG) 25 MG tablet TAKE 1 TABLET (25 MG TOTAL) BY MOUTH TWICE A DAY WITH MEALS 180 tablet 3 05/20/2023 Morning   Cholecalciferol (VITAMIN D-3 PO) Take 1 tablet by mouth daily.   05/20/2023 Morning   dicyclomine (BENTYL) 10 MG capsule TAKE 1 CAPSULE (10 MG TOTAL) BY MOUTH 4 (FOUR) TIMES DAILY BEFORE MEALS AND AT BEDTIME. 120 capsule 1 05/20/2023 Morning   furosemide (LASIX) 20 MG tablet Take 2 tablets (40 mg) by  mouth once a day. 60 tablet 11 05/20/2023 Morning   gabapentin (NEURONTIN) 100 MG capsule Take 200 mg by mouth 2 (two) times daily.   05/20/2023 Morning   insulin aspart (NOVOLOG FLEXPEN) 100 UNIT/ML FlexPen Inject up to 15 units daily under skin as advised (Patient taking differently: Inject 4-5 Units into the skin 3 (three) times daily before meals. Inject up to 15 units daily under skin as advised) 15 mL 11 Past Week   insulin degludec (TRESIBA FLEXTOUCH) 100 UNIT/ML FlexTouch Pen Inject 12-14 Units into the skin daily. (Patient taking differently: Inject 14 Units into the skin at bedtime.) 9 mL 3 05/19/2023 Bedtime   levETIRAcetam (KEPPRA XR) 500 MG 24 hr tablet Take 1 tablet (500 mg total) by mouth at bedtime. 90 tablet 3 05/19/2023  Bedtime   losartan (COZAAR) 25 MG tablet Take 25 mg by mouth daily.   05/20/2023 Morning   Multiple Vitamins-Minerals (EYE VITAMINS PO) Take 1 tablet by mouth 2 (two) times daily.   05/20/2023 Morning   nitroGLYCERIN (NITROSTAT) 0.4 MG SL tablet Place 1 tablet (0.4 mg total) under the tongue every 5 (five) minutes as needed for chest pain. 25 tablet 3 Past Month   omeprazole (PRILOSEC) 20 MG capsule Take 1 capsule by mouth daily.   05/20/2023 Morning   Rivaroxaban (XARELTO) 15 MG TABS tablet Take 1 tablet (15 mg total) by mouth daily with supper. (Patient taking differently: Take 15 mg by mouth at bedtime.) 30 tablet 6 05/19/2023 Bedtime   rosuvastatin (CRESTOR) 40 MG tablet Take 1 tablet (40 mg total) by mouth daily. NEEDS FOLLOW UP APPOINTMENT FOR MORE REFILLS (Patient taking differently: Take 40 mg by mouth at bedtime.) 90 tablet 0 05/19/2023 Bedtime   sertraline (ZOLOFT) 50 MG tablet Take 50 mg by mouth at bedtime.   05/19/2023 Bedtime   Continuous Glucose Sensor (FREESTYLE LIBRE 3 SENSOR) MISC 1 each by Does not apply route every 14 (fourteen) days. 6 each 3    magnesium oxide (MAG-OX) 400 (240 Mg) MG tablet TAKE 1 TABLET BY MOUTH TWICE A DAY (Patient not taking: Reported on 05/20/2023) 180 tablet 1 Not Taking   Scheduled:   amiodarone  200 mg Per Tube Daily   aspirin  81 mg Per Tube Daily   atorvastatin  80 mg Per Tube Daily   bisacodyl  10 mg Rectal Once   busPIRone  5 mg Per Tube BID   carvedilol  6.25 mg Per Tube BID WC   Chlorhexidine Gluconate Cloth  6 each Topical Daily   famotidine  20 mg Per Tube Daily   free water  200 mL Per Tube Q4H   gabapentin  200 mg Per Tube Q12H   insulin aspart  0-20 Units Subcutaneous Q4H   insulin aspart  4 Units Subcutaneous Q4H   insulin glargine  12 Units Subcutaneous BID   ipratropium-albuterol  3 mL Nebulization Q6H   metolazone  10 mg Per Tube BID   mouth rinse  15 mL Mouth Rinse 4 times per day   polyethylene glycol  17 g Per Tube BID    predniSONE  30 mg Per Tube Q breakfast   sennosides  10 mL Per Tube BID   sertraline  50 mg Per Tube QHS   thiamine  100 mg Per Tube Daily    Assessment: 78 yo female here with VT and ICD shock noted with, VDRF and AKI. 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 Pharmacy consulted to dose heparin.   Heparin level is therapeutic at 0.32, on 1000 units/hr. Hgb 10.3, plt  trending down to 88. No s/sx of bleeding or infusion issues. Running peripherally and drawn from A-line.  3/8 AM update:  Heparin level sub-therapeutic Plts down some this AM (107>88>62)-watch closely  Goal of Therapy:  Heparin level 0.3-0.7 units/mL Monitor platelets by anticoagulation protocol: Yes   Plan:  Inc heparin to 1100 units/hr 1300 heparin level Watch Plts  Abran Duke, PharmD, BCPS Clinical Pharmacist Phone: [phone]

## 2023-05-29 NOTE — Progress Notes (Signed)
 BLE venous duplex has been completed.   Results can be found under chart review under CV PROC. 05/29/2023 4:19 PM Jenee Spaugh RVT, RDMS

## 2023-05-29 NOTE — Consult Note (Signed)
 Palliative Care Consult Note                                  Date: 05/29/2023   Patient Name: [name]  DOB: 20-Apr-1945  MRN: [id number]  Age / Sex: 78 y.o., female  PCP: Pcp, No Referring Physician: Lorin Glass, MD  Reason for Consultation: Establishing goals of care  HPI/Patient Profile: 78 y.o. female  with past medical history of chronic HFrEF, status post ICD, diabetes, neuropathy, CAD, CKD stage IIIb, atrial fibrillation, seizure disorder, ventricular fibrillation, hypertension, and hyperlipidemia.  She presented to the ED on 05/20/2023 feeling unwell for several days and reporting her ICD fired.  She has found to have influenza A and pneumonia. She was intubated in the ED due to increased work of breathing prompting intubation. She was in septic shock requiring vasopressor support (now off).   She was extubated 3/3, but has remained BiPAP dependent ever since.   Palliative Medicine has been consulted for goals of care.   Past Medical History:  Diagnosis Date   Arthritis    Atrial fibrillation (HCC)    Chronic renal insufficiency    Chronic systolic CHF (congestive heart failure) (HCC) 03/20/2013   Coronary artery disease 01/31/2011   Diabetes mellitus    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    Hyperlipidemia    Hypertension    Ischemic cardiomyopathy    Lumbar spondylosis 01/11/2012   Sleep apnea    Stroke [facility]) 2010   eye doctor said she had TIA   Ventricular tachycardia (HCC)     Goals of Care:   Extensive chart review has been completed including labs, vital signs, imaging, progress/consult notes, orders, medications and available advance directive documents.   I assessed patient at bedside and received an update from her RN. Patient remains on BiPAP. She follows commands, but is very weak.   I met with son/Brian (in-person) and daughter/Lisa (by phone) to discuss diagnosis, prognosis, GOC,  and options.  I introduced Palliative Medicine as specialized medical care for people with serious illness.  Created space and opportunity for family to express thoughts and feelings regarding current medical situation. Values and goals of care were attempted to be elicited.  Life Review: Patient divorced. She has 2 living children Arlys John and [place]). She also had another son who tragically died 5 years ago. She has multiple grandchildren and great-grandchildren. She also has a beloved [place] ("Lum[place]lum")  Functional Status: Patient lives at home with her son Arlys John and the granddaughter Erie Noe.  She is ambulatory, but has decreased mobility due to severe knee pain. Independent with basic ADLs.   GOC Discussion: We reviewed patient's hospital course in detail. Current clinical status was reviewed. We discussed her multiple acute and chronic medical issues, including heart failure with reduced EF, acute respiratory failure, and AKI on CKD.  We discussed her tenuous respiratory status with hypoxia, and that she has been unable to come off BiPAP.        Discussed that patient is high risk to decompensate secondary to her multiple comorbidities.   I encouraged family to consider what medical interventions patient  would or would not want in the likely event her condition were to deteriorate, keeping in mind the concept of quality of life.    Discussed code status. I encouraged consideration of DNR/DNI status and provided education on evidence-based poor outcomes in similar hospitalized patients, as the cause of cardiac arrest would likely be associated with advanced illness rather than a reversible condition.  Family is very receptive to this information and will consider changing code status to DNR (with full scope interventions) but are not ready to make that decision today.  At this time, family is open to all offered and available medical interventions to prolong life and ultimately are hopeful  for improvement. They feel that patient has a good quality of life at baseline, and would want every chance to continue living. I did share my concern that even with best case scenario, patient will have a long/difficult road to recovery and would not return to her previous baseline.  Family feels that patient would be accepting of that as long as she was able to interact with her family.   Family does acknowledge they do not want patient to "suffer". They are open to ongoing GOC discussions, and will make decisions as they arise pending patient's clinical course.  Review of Systems  Unable to perform ROS   Objective:   Primary Diagnoses: Present on Admission:  VT (ventricular tachycardia) (HCC)  Ventricular fibrillation (HCC)  Spinal stenosis of lumbar region  PAF (paroxysmal atrial fibrillation) (HCC)  Obesity, unspecified  Lower back pain  Implantable cardioverter-defibrillator (ICD) in situ  Hypothyroidism  Hyperlipidemia  HTN (hypertension)  DM (diabetes mellitus), type 2 with renal complications (HCC)  Diabetic peripheral neuropathy associated with type 2 diabetes mellitus (HCC)  Coronary artery disease  (Resolved) CKD (chronic kidney disease)  Gastroesophageal reflux disease without esophagitis  Chronic systolic CHF (congestive heart failure) (HCC)  Acute respiratory failure with hypoxia (HCC)  Acute renal failure superimposed on stage 3b chronic kidney disease (HCC)   Physical Exam Vitals reviewed.  Constitutional:      General: She is not in acute distress.    Appearance: She is ill-appearing.  Pulmonary:     Effort: Tachypnea present.     Comments: BiPAP Neurological:     Mental Status: She is lethargic.     Motor: Weakness present.     Comments: Follows commands    Palliative Assessment/Data: PPS 30%     Assessment & Plan:   SUMMARY OF RECOMMENDATIONS   Continue full scope care Goal is medical stabilization - family is hopeful for improvement   Ongoing palliative support  Primary Decision Maker: NEXT OF KIN - son/Brian and daughter/Lisa  Code Status/Advance Care Planning: Full code - family will consider changing code status to DNR (with full interventions) but are not ready to make that decision today  Prognosis:  Unable to determine  Discharge Planning:  To Be Determined   Discussed with: Dr. Katrinka Blazing   Thank you for allowing [name] to participate in the care of Coralee North  Time Total: 90 minutes  Detailed review of medical records (labs, imaging, vital signs), medically appropriate exam, discussed with treatment team, counseling and education to family, & staff, documenting clinical information, coordination of care.   Signed by: Sherlean Foot, NP Palliative Medicine Team  Team Phone # [phone]  For individual providers, please see AMION

## 2023-05-29 NOTE — Progress Notes (Signed)
 NAME:  [name], MRN:  [id number], DOB:  12-19-1945, LOS: 8 ADMISSION DATE:  05/20/2023, CONSULTATION DATE:  05/20/2023 REFERRING MD:  Alinda Money - [facility] CHIEF COMPLAINT:  N/V/Abd pain / Resp failure   History of Present Illness:  78 year old female with medical history significant of hypertension, hyperlipidemia, diabetes, neuropathy, hypothyroidism, CKD 3B, CAD, GERD, CVA, chronic systolic CHF, depression, anxiety, ventricular tachycardia, ventricular fibrillation, atrial fibrillation, status post ICD, obesity, seizure disorder, low back pain, spinal stenosis presenting after feeling unwell for several days and recent ICD discharges.   Patient reports that she has been unwell for possible days.  She is experience nausea vomiting and abdominal pain.  She reports that her defibrillator fired multiple times today.  ED provider confirmed this with the [facility] representative who noted that the device did fire 3 times a day for ventricular fibrillation.  Also noted periods of slow ventricular tachycardia as well as atrial fibrillation.   Patient denies fevers, chills.   ED Course: Vital signs in the ED notable for blood pressure initially in the 70 systolic now improved to the 120s.  Respiratory rate in the 20s.  Requiring 3 L to maintain saturations.  Lab workup included CMP with potassium 3.2, bicarb 18, gap 17, BUN 32, creatinine elevated 2.37 baseline of 2, glucose 2 1, calcium 7.9, protein 6.0, albumin 2.5, T. bili 1.9, AST 1076, ALT 663.  CBC with platelets 142.  BNP elevated to 2548.  Troponin trend 339, 417.  Lipase normal.  Respiratory panel positive for flu A.  Lactic acid pending.  Urinalysis pending.  Blood cultures pending.  VBG with normal pH pCO2 40.7.  Magnesium pending.  Chest x-ray with new bilateral opacities consistent with pneumonia.  CT of the abdomen pelvis showing no acute normality in the abdomen pelvis, bilateral opacity at the lung bases, left renal cysts.  Patient received  ceftriaxone, doxycycline, Tamiflu in the ED.  Started on amiodarone infusion.  Also received morphine, 1 L IV fluids and 40 mEq p.o. potassium x 2.  Cardiology consulted and recommended amiodarone fusion, repletion of magnesium and potassium as needed, adding back beta-blocker as blood pressure allows.  Pertinent Medical History:   has a past medical history of Acute kidney injury superimposed on chronic kidney disease (HCC) (10/02/2022), Allergy, Arthritis, Atrial fibrillation (HCC), Chronic renal insufficiency, Chronic systolic CHF (congestive heart failure) (HCC) (03/20/2013), Chronic systolic heart failure (HCC), Coronary artery disease (01/31/2011), Diabetes mellitus, 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, History of chicken pox, Hyperlipidemia, Hypertension, Ischemic cardiomyopathy, Knee MCL sprain (03/28/2014), Left knee pain (08/30/2012), Lumbar spondylosis (01/11/2012), Pes anserine bursitis (02/23/2014), Sleep apnea, Stroke (HCC) (2010), Suicide attempt by benzodiazepine overdose (HCC) (01/21/2020), UTI (urinary tract infection) (08/03/2013), and Ventricular tachycardia (HCC).   Significant Hospital Events: Including procedures, antibiotic start and stop dates in addition to other pertinent events   2/27 Presented to ED feeling unwell, s/p AICD discharge x 3 for VT/slow VT. Hypotensive with increased WOB prompting intubation. Flu A+,  CXR c/w PNA. 2/28 Remains on high-dose vasopressor support with Levophed. No more episodes of V. Tach. Remain acidotic mixed metabolic and respiratory. 3/1 Remains on ventilator with FiO2 50 PEEP 8, remains on both levo and vaso 3/3 extubated to bipap 3/4 treated w/ lasix still on NIPPV 3/5 inc'd WOB. CXR still w/ marked bilateral airspace disease. ABG w/ sig hypoxia and resp alk. PEEP inc'd on NIPPV. Increased lasix. Widened ABX coverage due to clinical decline  3/6 attempting short rotations of [facility] but still w/  marked WOB.  Goals of care discussion  with family they continued to request aggressive care.  Shared with them my concern about limiting factor would be renal function, given underlying cardiomyopathy she would be a poor candidate for dialysis 3/7 renal function a little worse CVP 6, using albumin and Lasix to try to mobilize total body overload still BiPAP dependent  Interim History / Subjective:  Looks about the same  Objective:  Blood pressure (!) 102/52, pulse 81, temperature (!) 97.5 F (36.4 C), resp. rate (!) 29, height 5\' 2"  (1.575 m), weight 92.7 kg, SpO2 94%. CVP:  [7 mmHg-12 mmHg] 7 mmHg  Vent Mode: PCV;BIPAP FiO2 (%):  [60 %-70 %] 60 % Set Rate:  [12 bmp] 12 bmp PEEP:  [8 cmH20] 8 cmH20   Intake/Output Summary (Last 24 hours) at 05/29/2023 1203 Last data filed at 05/29/2023 1000 Gross per 24 hour  Intake 2417.47 ml  Output 3895 ml  Net -1477.53 ml   Filed Weights   05/27/23 0500 05/28/23 0500 05/29/23 0500  Weight: 95 kg 93 kg 92.7 kg   Physical Examination: Weak, frail Moves to command Bilateral rhonci Marked anasarca RASS -1 +tachypnea Per RN/RT, has looked like this for past several days  Resolved Hospital Problem List:  Septic shock Shock liver, improved  Assessment & Plan:  Acute respiratory failure with hypoxia and hypercapnia due to  Influenza pneumonia (tamiflu completed) and Bilateral superimposed bacterial pneumonia, presumed ongoing volume overload, deconditioning.  Extubated 3/3 but has remained BIPAP-dependent since. AKI on CKD4 Worsening thrombocytopenia- med 4T score, keep eye on query sequestration or septic response DM2 with hyperglycemia HFrEF, Afib/flutter, hx of VT w/ ICD in place Muscular deconditioning Multiple pressure ulcers  - Attempting salvage albumin/lasix; replete electrolytes pRN - Continue amio and heparin for now - Wean off steroids - Pct markedly improved, not sure any role for additional abx - Check LE duplex; if + obligated to tx as HIT - Cefepime end date  3/10 - Appreciate palliative help, not sure we are going to have good outcome here, close intubation watch   Best Practice (right click and "Reselect all SmartList Selections" daily)   Diet/type: NPO, getting tubefeeds DVT prophylaxis IV heparin infusion Pressure ulcer(s): N/A GI prophylaxis: H2B Lines: right internal jugular CVL - 05/20/23 Foley: Yes needed Code Status:  full code Last date of multidisciplinary goals of care discussion: 3/6 with entire family.  They continue to request aggressive care  Critical care time: 37 min

## 2023-05-29 NOTE — Progress Notes (Signed)
 PHARMACY - ANTICOAGULATION CONSULT NOTE  Pharmacy Consult for heparin  Indication: atrial fibrillation  Allergies  Allergen Reactions   Veltassa [Patiromer] Diarrhea   Aldactone [Spironolactone] Diarrhea and Nausea And Vomiting   Darvon [Propoxyphene] Other (See Comments)    Indigestion    Mexitil [Mexiletine] Nausea And Vomiting and Other (See Comments)    Tremors Difficulty walking Blurred vision   Phenergan [Promethazine Hcl] Other (See Comments)    Hyperactivity    Ranexa [Ranolazine Er] Diarrhea   Plavix [Clopidogrel Bisulfate] Rash    Patient Measurements: Height: 5\' 2"  (157.5 cm) Weight: 92.7 kg (204 lb 5.9 oz) IBW/kg (Calculated) : 50.1 HEPARIN DW (KG): 68.6   Vital Signs: Temp: 97.5 F (36.4 C) (03/08 1508) Temp Source: Bladder (03/08 0800) BP: 129/54 (03/08 1400) Pulse Rate: 60 (03/08 1508)  Labs: Recent Labs    05/27/23 0403 05/27/23 1556 05/28/23 0422 05/28/23 1109 05/28/23 1731 05/29/23 0416 05/29/23 1231  HGB 10.0*  --  10.3*  --   --  8.6*  --   HCT 28.5*  --  28.9*  --   --  24.8*  --   PLT 107*  --  88*  --   --  72*  --   HEPARINUNFRC 0.35  --  0.32  --   --  0.19* 0.19*  CREATININE 2.34*   < > 2.39* 2.61* 2.46* 2.52*  --    < > = values in this interval not displayed.    Estimated Creatinine Clearance: 20.1 mL/min (A) (by C-G formula based on SCr of 2.52 mg/dL (H)).   Medical History: Past Medical History:  Diagnosis Date   Acute kidney injury superimposed on chronic kidney disease (HCC) 10/02/2022   Allergy    Arthritis    Atrial fibrillation (HCC)    controlled with amiodarone, on coumadin   Chronic renal insufficiency    Chronic systolic CHF (congestive heart failure) (HCC) 03/20/2013   Chronic systolic heart failure (HCC)    Coronary artery disease 01/31/2011   Diabetes mellitus    type 1   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    History of chicken pox    Hyperlipidemia    Hypertension    Ischemic  cardiomyopathy    Knee MCL sprain 03/28/2014   Left knee pain 08/30/2012   Lumbar spondylosis 01/11/2012   Pes anserine bursitis 02/23/2014   Sleep apnea    Stroke (HCC) 2010   eye doctor said she had TIA   Suicide attempt by benzodiazepine overdose (HCC) 01/21/2020   UTI (urinary tract infection) 08/03/2013   Ventricular tachycardia (HCC)    Polymorphic    Medications:  Medications Prior to Admission  Medication Sig Dispense Refill Last Dose/Taking   amiodarone (PACERONE) 200 MG tablet Take 1 tablet (200 mg total) by mouth daily. 30 tablet 6 05/20/2023 Morning   aspirin EC 81 MG tablet Take 1 tablet (81 mg total) by mouth daily. Swallow whole. 30 tablet 12 05/20/2023 Morning   busPIRone (BUSPAR) 5 MG tablet Take 1 tablet (5 mg total) by mouth 2 (two) times daily. 60 tablet 1 05/20/2023 Morning   carvedilol (COREG) 25 MG tablet TAKE 1 TABLET (25 MG TOTAL) BY MOUTH TWICE A DAY WITH MEALS 180 tablet 3 05/20/2023 Morning   Cholecalciferol (VITAMIN D-3 PO) Take 1 tablet by mouth daily.   05/20/2023 Morning   dicyclomine (BENTYL) 10 MG capsule TAKE 1 CAPSULE (10 MG TOTAL) BY MOUTH 4 (FOUR) TIMES DAILY BEFORE MEALS AND AT BEDTIME. 120 capsule 1  05/20/2023 Morning   furosemide (LASIX) 20 MG tablet Take 2 tablets (40 mg) by mouth once a day. 60 tablet 11 05/20/2023 Morning   gabapentin (NEURONTIN) 100 MG capsule Take 200 mg by mouth 2 (two) times daily.   05/20/2023 Morning   insulin aspart (NOVOLOG FLEXPEN) 100 UNIT/ML FlexPen Inject up to 15 units daily under skin as advised (Patient taking differently: Inject 4-5 Units into the skin 3 (three) times daily before meals. Inject up to 15 units daily under skin as advised) 15 mL 11 Past Week   insulin degludec (TRESIBA FLEXTOUCH) 100 UNIT/ML FlexTouch Pen Inject 12-14 Units into the skin daily. (Patient taking differently: Inject 14 Units into the skin at bedtime.) 9 mL 3 05/19/2023 Bedtime   levETIRAcetam (KEPPRA XR) 500 MG 24 hr tablet Take 1 tablet (500  mg total) by mouth at bedtime. 90 tablet 3 05/19/2023 Bedtime   losartan (COZAAR) 25 MG tablet Take 25 mg by mouth daily.   05/20/2023 Morning   Multiple Vitamins-Minerals (EYE VITAMINS PO) Take 1 tablet by mouth 2 (two) times daily.   05/20/2023 Morning   nitroGLYCERIN (NITROSTAT) 0.4 MG SL tablet Place 1 tablet (0.4 mg total) under the tongue every 5 (five) minutes as needed for chest pain. 25 tablet 3 Past Month   omeprazole (PRILOSEC) 20 MG capsule Take 1 capsule by mouth daily.   05/20/2023 Morning   Rivaroxaban (XARELTO) 15 MG TABS tablet Take 1 tablet (15 mg total) by mouth daily with supper. (Patient taking differently: Take 15 mg by mouth at bedtime.) 30 tablet 6 05/19/2023 Bedtime   rosuvastatin (CRESTOR) 40 MG tablet Take 1 tablet (40 mg total) by mouth daily. NEEDS FOLLOW UP APPOINTMENT FOR MORE REFILLS (Patient taking differently: Take 40 mg by mouth at bedtime.) 90 tablet 0 05/19/2023 Bedtime   sertraline (ZOLOFT) 50 MG tablet Take 50 mg by mouth at bedtime.   05/19/2023 Bedtime   Continuous Glucose Sensor (FREESTYLE LIBRE 3 SENSOR) MISC 1 each by Does not apply route every 14 (fourteen) days. 6 each 3    magnesium oxide (MAG-OX) 400 (240 Mg) MG tablet TAKE 1 TABLET BY MOUTH TWICE A DAY (Patient not taking: Reported on 05/20/2023) 180 tablet 1 Not Taking   Scheduled:   amiodarone  200 mg Per Tube Daily   aspirin  81 mg Per Tube Daily   atorvastatin  80 mg Per Tube Daily   bisacodyl  10 mg Rectal Once   busPIRone  5 mg Per Tube BID   carvedilol  6.25 mg Per Tube BID WC   Chlorhexidine Gluconate Cloth  6 each Topical Daily   famotidine  20 mg Per Tube Daily   free water  200 mL Per Tube Q4H   gabapentin  200 mg Per Tube Q12H   insulin aspart  0-20 Units Subcutaneous Q4H   insulin aspart  4 Units Subcutaneous Q4H   insulin glargine  12 Units Subcutaneous BID   ipratropium-albuterol  3 mL Nebulization Q6H   metolazone  10 mg Per Tube BID   mouth rinse  15 mL Mouth Rinse 4 times per day    pantoprazole (PROTONIX) IV  40 mg Intravenous QHS   polyethylene glycol  17 g Per Tube BID   [START ON 05/30/2023] predniSONE  10 mg Per Tube Q breakfast   sennosides  10 mL Per Tube BID   sertraline  50 mg Per Tube QHS   thiamine  100 mg Per Tube Daily    Assessment: 78 yo  female here with VT and ICD shock noted with, VDRF and AKI. 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 Pharmacy consulted to dose heparin.   Heparin level is subtherapeutic at 0.19, on heparin drip rate 1100 units/hr. Hgb slight trend down 8.6, plt  trending down to 70 No s/sx of bleeding or infusion issues. Running peripherally and drawn from A-line.  Dopplers negative for DVT b/l 3/8  Goal of Therapy:  Heparin level 0.3-0.7 units/mL Monitor platelets by anticoagulation protocol: Yes   Plan:  Inc heparin to 1200 units/hr Watch Plts Daily heparin level and cbc  Monitor s/s bleeding     Leota Sauers Pharm.D. CPP, BCPS Clinical Pharmacist [phone] 05/29/2023 4:42 PM

## 2023-05-30 ENCOUNTER — Inpatient Hospital Stay (HOSPITAL_COMMUNITY)

## 2023-05-30 DIAGNOSIS — I4891 Unspecified atrial fibrillation: Secondary | ICD-10-CM | POA: Diagnosis not present

## 2023-05-30 DIAGNOSIS — S301XXA Contusion of abdominal wall, initial encounter: Secondary | ICD-10-CM | POA: Diagnosis not present

## 2023-05-30 DIAGNOSIS — Z4682 Encounter for fitting and adjustment of non-vascular catheter: Secondary | ICD-10-CM | POA: Diagnosis not present

## 2023-05-30 DIAGNOSIS — N184 Chronic kidney disease, stage 4 (severe): Secondary | ICD-10-CM | POA: Diagnosis not present

## 2023-05-30 DIAGNOSIS — J80 Acute respiratory distress syndrome: Secondary | ICD-10-CM | POA: Diagnosis not present

## 2023-05-30 DIAGNOSIS — I5020 Unspecified systolic (congestive) heart failure: Secondary | ICD-10-CM | POA: Diagnosis not present

## 2023-05-30 DIAGNOSIS — I5022 Chronic systolic (congestive) heart failure: Secondary | ICD-10-CM | POA: Diagnosis not present

## 2023-05-30 DIAGNOSIS — R0989 Other specified symptoms and signs involving the circulatory and respiratory systems: Secondary | ICD-10-CM | POA: Diagnosis not present

## 2023-05-30 DIAGNOSIS — J9601 Acute respiratory failure with hypoxia: Secondary | ICD-10-CM | POA: Diagnosis not present

## 2023-05-30 DIAGNOSIS — Q211 Atrial septal defect, unspecified: Secondary | ICD-10-CM | POA: Diagnosis not present

## 2023-05-30 DIAGNOSIS — A419 Sepsis, unspecified organism: Secondary | ICD-10-CM | POA: Diagnosis not present

## 2023-05-30 DIAGNOSIS — E1165 Type 2 diabetes mellitus with hyperglycemia: Secondary | ICD-10-CM | POA: Diagnosis not present

## 2023-05-30 DIAGNOSIS — J1100 Influenza due to unidentified influenza virus with unspecified type of pneumonia: Secondary | ICD-10-CM | POA: Diagnosis not present

## 2023-05-30 DIAGNOSIS — J9602 Acute respiratory failure with hypercapnia: Secondary | ICD-10-CM | POA: Diagnosis not present

## 2023-05-30 DIAGNOSIS — D696 Thrombocytopenia, unspecified: Secondary | ICD-10-CM | POA: Diagnosis not present

## 2023-05-30 LAB — ECHOCARDIOGRAM COMPLETE
AR max vel: 2.14 cm2
AV Area VTI: 2.38 cm2
AV Area mean vel: 2.24 cm2
AV Mean grad: 4.6 mmHg
AV Peak grad: 12 mmHg
Ao pk vel: 1.73 m/s
Area-P 1/2: 3.27 cm2
Est EF: 40
Height: 62 in
P 1/2 time: 695 ms
S' Lateral: 4 cm
Single Plane A4C EF: 36.6 %
Weight: 3146.41 [oz_av]

## 2023-05-30 LAB — CBC
HCT: 24 % — ABNORMAL LOW (ref 36.0–46.0)
Hemoglobin: 8.1 g/dL — ABNORMAL LOW (ref 12.0–15.0)
MCH: 27.8 pg (ref 26.0–34.0)
MCHC: 33.8 g/dL (ref 30.0–36.0)
MCV: 82.5 fL (ref 80.0–100.0)
Platelets: 65 10*3/uL — ABNORMAL LOW (ref 150–400)
RBC: 2.91 MIL/uL — ABNORMAL LOW (ref 3.87–5.11)
RDW: 20.3 % — ABNORMAL HIGH (ref 11.5–15.5)
WBC: 19.3 10*3/uL — ABNORMAL HIGH (ref 4.0–10.5)
nRBC: 0.4 % — ABNORMAL HIGH (ref 0.0–0.2)

## 2023-05-30 LAB — POCT I-STAT 7, (LYTES, BLD GAS, ICA,H+H)
Acid-base deficit: 6 mmol/L — ABNORMAL HIGH (ref 0.0–2.0)
Bicarbonate: 20.8 mmol/L (ref 20.0–28.0)
Calcium, Ion: 1.24 mmol/L (ref 1.15–1.40)
HCT: 29 % — ABNORMAL LOW (ref 36.0–46.0)
Hemoglobin: 9.9 g/dL — ABNORMAL LOW (ref 12.0–15.0)
O2 Saturation: 91 %
Patient temperature: 35.8
Potassium: 3.8 mmol/L (ref 3.5–5.1)
Sodium: 147 mmol/L — ABNORMAL HIGH (ref 135–145)
TCO2: 22 mmol/L (ref 22–32)
pCO2 arterial: 42.5 mmHg (ref 32–48)
pH, Arterial: 7.291 — ABNORMAL LOW (ref 7.35–7.45)
pO2, Arterial: 63 mmHg — ABNORMAL LOW (ref 83–108)

## 2023-05-30 LAB — LACTIC ACID, PLASMA
Lactic Acid, Venous: 0.7 mmol/L (ref 0.5–1.9)
Lactic Acid, Venous: 1 mmol/L (ref 0.5–1.9)

## 2023-05-30 LAB — BASIC METABOLIC PANEL
Anion gap: 16 — ABNORMAL HIGH (ref 5–15)
Anion gap: 16 — ABNORMAL HIGH (ref 5–15)
BUN: 120 mg/dL — ABNORMAL HIGH (ref 8–23)
BUN: 134 mg/dL — ABNORMAL HIGH (ref 8–23)
CO2: 19 mmol/L — ABNORMAL LOW (ref 22–32)
CO2: 20 mmol/L — ABNORMAL LOW (ref 22–32)
Calcium: 8.7 mg/dL — ABNORMAL LOW (ref 8.9–10.3)
Calcium: 8.5 mg/dL — ABNORMAL LOW (ref 8.9–10.3)
Chloride: 109 mmol/L (ref 98–111)
Chloride: 106 mmol/L (ref 98–111)
Creatinine, Ser: 2.6 mg/dL — ABNORMAL HIGH (ref 0.44–1.00)
Creatinine, Ser: 2.92 mg/dL — ABNORMAL HIGH (ref 0.44–1.00)
GFR, Estimated: 19 mL/min — ABNORMAL LOW (ref >60)
GFR, Estimated: 16 mL/min — ABNORMAL LOW (ref >60)
Glucose, Bld: 94 mg/dL (ref 70–99)
Glucose, Bld: 238 mg/dL — ABNORMAL HIGH (ref 70–99)
Potassium: 3.8 mmol/L (ref 3.5–5.1)
Potassium: 5.2 mmol/L — ABNORMAL HIGH (ref 3.5–5.1)
Sodium: 144 mmol/L (ref 135–145)
Sodium: 142 mmol/L (ref 135–145)

## 2023-05-30 LAB — MAGNESIUM: Magnesium: 2 mg/dL (ref 1.7–2.4)

## 2023-05-30 LAB — GLUCOSE, CAPILLARY
Glucose-Capillary: 121 mg/dL — ABNORMAL HIGH (ref 70–99)
Glucose-Capillary: 137 mg/dL — ABNORMAL HIGH (ref 70–99)
Glucose-Capillary: 152 mg/dL — ABNORMAL HIGH (ref 70–99)
Glucose-Capillary: 183 mg/dL — ABNORMAL HIGH (ref 70–99)
Glucose-Capillary: 251 mg/dL — ABNORMAL HIGH (ref 70–99)
Glucose-Capillary: 88 mg/dL (ref 70–99)
Glucose-Capillary: 97 mg/dL (ref 70–99)

## 2023-05-30 LAB — COOXEMETRY PANEL
Carboxyhemoglobin: 1.2 % (ref 0.5–1.5)
Methemoglobin: 0.7 % (ref 0.0–1.5)
O2 Saturation: 80.7 %
Total hemoglobin: 8.5 g/dL — ABNORMAL LOW (ref 12.0–16.0)

## 2023-05-30 LAB — PROCALCITONIN: Procalcitonin: 2.49 ng/mL

## 2023-05-30 LAB — APTT: aPTT: 70 s — ABNORMAL HIGH (ref 24–36)

## 2023-05-30 LAB — AMMONIA: Ammonia: 18 umol/L (ref 9–35)

## 2023-05-30 LAB — PHOSPHORUS: Phosphorus: 5.7 mg/dL — ABNORMAL HIGH (ref 2.5–4.6)

## 2023-05-30 LAB — HEPARIN LEVEL (UNFRACTIONATED): Heparin Unfractionated: 0.33 [IU]/mL (ref 0.30–0.70)

## 2023-05-30 LAB — SEDIMENTATION RATE: Sed Rate: 14 mm/h (ref 0–22)

## 2023-05-30 MED ORDER — DOCUSATE SODIUM 50 MG/5ML PO LIQD
100.0000 mg | Freq: Two times a day (BID) | ORAL | Status: DC
  Administered 2023-05-30 – 2023-06-02 (×4): 100 mg
  Filled 2023-05-30 (×5): qty 10

## 2023-05-30 MED ORDER — ETOMIDATE 2 MG/ML IV SOLN
INTRAVENOUS | Status: AC
  Administered 2023-05-30: 20 mg via INTRAVENOUS
  Filled 2023-05-30: qty 20

## 2023-05-30 MED ORDER — FENTANYL CITRATE PF 50 MCG/ML IJ SOSY: 50.0000 ug | PREFILLED_SYRINGE | Freq: Once | INTRAMUSCULAR | Status: AC

## 2023-05-30 MED ORDER — ORAL CARE MOUTH RINSE: 15.0000 mL | OROMUCOSAL | Status: DC | PRN

## 2023-05-30 MED ORDER — MIDAZOLAM HCL 2 MG/2ML IJ SOLN
1.0000 mg | INTRAMUSCULAR | Status: DC | PRN
  Administered 2023-05-30: 1 mg via INTRAVENOUS
  Filled 2023-05-30: qty 2

## 2023-05-30 MED ORDER — MIDAZOLAM HCL 2 MG/2ML IJ SOLN
INTRAMUSCULAR | Status: AC
  Administered 2023-05-30: 2 mg via INTRAVENOUS
  Filled 2023-05-30: qty 2

## 2023-05-30 MED ORDER — FUROSEMIDE 10 MG/ML IJ SOLN
160.0000 mg | Freq: Three times a day (TID) | INTRAVENOUS | Status: AC
  Administered 2023-05-30 (×2): 160 mg via INTRAVENOUS
  Filled 2023-05-30: qty 10
  Filled 2023-05-30: qty 2

## 2023-05-30 MED ORDER — STERILE WATER FOR INJECTION IV SOLN
INTRAVENOUS | Status: DC
  Filled 2023-05-30: qty 150

## 2023-05-30 MED ORDER — ETOMIDATE 2 MG/ML IV SOLN: 20.0000 mg | Freq: Once | INTRAVENOUS | Status: AC

## 2023-05-30 MED ORDER — FENTANYL CITRATE PF 50 MCG/ML IJ SOSY
PREFILLED_SYRINGE | INTRAMUSCULAR | Status: AC
Start: 2023-05-30 — End: 2023-05-30
  Administered 2023-05-30: 50 ug via INTRAVENOUS
  Filled 2023-05-30: qty 2

## 2023-05-30 MED ORDER — METOLAZONE 5 MG PO TABS
10.0000 mg | ORAL_TABLET | Freq: Two times a day (BID) | ORAL | Status: DC
  Administered 2023-05-30 – 2023-05-31 (×3): 10 mg
  Filled 2023-05-30 (×4): qty 2

## 2023-05-30 MED ORDER — ROCURONIUM BROMIDE 10 MG/ML (PF) SYRINGE: 50.0000 mg | PREFILLED_SYRINGE | Freq: Once | INTRAVENOUS | Status: AC

## 2023-05-30 MED ORDER — FENTANYL 2500MCG IN NS 250ML (10MCG/ML) PREMIX INFUSION
0.0000 ug/h | INTRAVENOUS | Status: DC
  Administered 2023-05-30 – 2023-05-31 (×2): 25 ug/h via INTRAVENOUS
  Administered 2023-06-02: 50 ug/h via INTRAVENOUS
  Administered 2023-06-04: 125 ug/h via INTRAVENOUS
  Filled 2023-05-30 (×4): qty 250

## 2023-05-30 MED ORDER — POTASSIUM CHLORIDE 20 MEQ PO PACK
40.0000 meq | PACK | Freq: Once | ORAL | Status: AC
  Administered 2023-05-30: 40 meq
  Filled 2023-05-30: qty 2

## 2023-05-30 MED ORDER — MIDAZOLAM HCL 2 MG/2ML IJ SOLN: 2.0000 mg | Freq: Once | INTRAMUSCULAR | Status: AC

## 2023-05-30 MED ORDER — ORAL CARE MOUTH RINSE
15.0000 mL | OROMUCOSAL | Status: DC
  Administered 2023-05-31 – 2023-06-04 (×54): 15 mL via OROMUCOSAL

## 2023-05-30 MED ORDER — NOREPINEPHRINE 4 MG/250ML-% IV SOLN
0.0000 ug/min | INTRAVENOUS | Status: DC
  Administered 2023-05-30: 7 ug/min via INTRAVENOUS
  Administered 2023-05-30: 2 ug/min via INTRAVENOUS
  Administered 2023-05-31: 5 ug/min via INTRAVENOUS
  Filled 2023-05-30 (×3): qty 250

## 2023-05-30 MED ORDER — ROCURONIUM BROMIDE 10 MG/ML (PF) SYRINGE
PREFILLED_SYRINGE | INTRAVENOUS | Status: AC
  Administered 2023-05-30: 50 mg via INTRAVENOUS
  Filled 2023-05-30: qty 10

## 2023-05-30 MED ORDER — SODIUM CHLORIDE 0.9 % IV SOLN
0.0300 mg/kg/h | INTRAVENOUS | Status: DC
  Administered 2023-05-30: 0.03 mg/kg/h via INTRAVENOUS
  Filled 2023-05-30 (×2): qty 250

## 2023-05-30 NOTE — Progress Notes (Addendum)
 PHARMACY - ANTICOAGULATION CONSULT NOTE  Pharmacy Consult for heparin  > Bivalirudin  Indication: atrial fibrillation  Allergies  Allergen Reactions   Veltassa [Patiromer] Diarrhea   Aldactone [Spironolactone] Diarrhea and Nausea And Vomiting   Darvon [Propoxyphene] Other (See Comments)    Indigestion    Mexitil [Mexiletine] Nausea And Vomiting and Other (See Comments)    Tremors Difficulty walking Blurred vision   Phenergan [Promethazine Hcl] Other (See Comments)    Hyperactivity    Ranexa [Ranolazine Er] Diarrhea   Plavix [Clopidogrel Bisulfate] Rash    Patient Measurements: Height: 5\' 2"  (157.5 cm) Weight: 89.2 kg (196 lb 10.4 oz) IBW/kg (Calculated) : 50.1 HEPARIN DW (KG): 68.6   Vital Signs: Temp: 96.4 F (35.8 C) (03/09 0748) BP: 112/48 (03/09 0700) Pulse Rate: 61 (03/09 0748)  Labs: Recent Labs    05/28/23 0422 05/28/23 1109 05/29/23 0416 05/29/23 1231 05/29/23 1735 05/30/23 0316 05/30/23 0330  HGB 10.3*  --  8.6*  --   --  8.1* 9.9*  HCT 28.9*  --  24.8*  --   --  24.0* 29.0*  PLT 88*  --  72*  --   --  65*  --   HEPARINUNFRC 0.32  --  0.19* 0.19*  --  0.33  --   CREATININE 2.39*   < > 2.52*  --  2.55* 2.60*  --    < > = values in this interval not displayed.    Estimated Creatinine Clearance: 19.1 mL/min (A) (by C-G formula based on SCr of 2.6 mg/dL (H)).   Medical History: Past Medical History:  Diagnosis Date   Acute kidney injury superimposed on chronic kidney disease (HCC) 10/02/2022   Allergy    Arthritis    Atrial fibrillation (HCC)    controlled with amiodarone, on coumadin   Chronic renal insufficiency    Chronic systolic CHF (congestive heart failure) (HCC) 03/20/2013   Chronic systolic heart failure (HCC)    Coronary artery disease 01/31/2011   Diabetes mellitus    type 1   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    History of chicken pox    Hyperlipidemia    Hypertension    Ischemic cardiomyopathy    Knee MCL sprain  03/28/2014   Left knee pain 08/30/2012   Lumbar spondylosis 01/11/2012   Pes anserine bursitis 02/23/2014   Sleep apnea    Stroke (HCC) 2010   eye doctor said she had TIA   Suicide attempt by benzodiazepine overdose (HCC) 01/21/2020   UTI (urinary tract infection) 08/03/2013   Ventricular tachycardia (HCC)    Polymorphic    Medications:  Medications Prior to Admission  Medication Sig Dispense Refill Last Dose/Taking   amiodarone (PACERONE) 200 MG tablet Take 1 tablet (200 mg total) by mouth daily. 30 tablet 6 05/20/2023 Morning   aspirin EC 81 MG tablet Take 1 tablet (81 mg total) by mouth daily. Swallow whole. 30 tablet 12 05/20/2023 Morning   busPIRone (BUSPAR) 5 MG tablet Take 1 tablet (5 mg total) by mouth 2 (two) times daily. 60 tablet 1 05/20/2023 Morning   carvedilol (COREG) 25 MG tablet TAKE 1 TABLET (25 MG TOTAL) BY MOUTH TWICE A DAY WITH MEALS 180 tablet 3 05/20/2023 Morning   Cholecalciferol (VITAMIN D-3 PO) Take 1 tablet by mouth daily.   05/20/2023 Morning   dicyclomine (BENTYL) 10 MG capsule TAKE 1 CAPSULE (10 MG TOTAL) BY MOUTH 4 (FOUR) TIMES DAILY BEFORE MEALS AND AT BEDTIME. 120 capsule 1 05/20/2023 Morning  furosemide (LASIX) 20 MG tablet Take 2 tablets (40 mg) by mouth once a day. 60 tablet 11 05/20/2023 Morning   gabapentin (NEURONTIN) 100 MG capsule Take 200 mg by mouth 2 (two) times daily.   05/20/2023 Morning   insulin aspart (NOVOLOG FLEXPEN) 100 UNIT/ML FlexPen Inject up to 15 units daily under skin as advised (Patient taking differently: Inject 4-5 Units into the skin 3 (three) times daily before meals. Inject up to 15 units daily under skin as advised) 15 mL 11 Past Week   insulin degludec (TRESIBA FLEXTOUCH) 100 UNIT/ML FlexTouch Pen Inject 12-14 Units into the skin daily. (Patient taking differently: Inject 14 Units into the skin at bedtime.) 9 mL 3 05/19/2023 Bedtime   levETIRAcetam (KEPPRA XR) 500 MG 24 hr tablet Take 1 tablet (500 mg total) by mouth at bedtime. 90  tablet 3 05/19/2023 Bedtime   losartan (COZAAR) 25 MG tablet Take 25 mg by mouth daily.   05/20/2023 Morning   Multiple Vitamins-Minerals (EYE VITAMINS PO) Take 1 tablet by mouth 2 (two) times daily.   05/20/2023 Morning   nitroGLYCERIN (NITROSTAT) 0.4 MG SL tablet Place 1 tablet (0.4 mg total) under the tongue every 5 (five) minutes as needed for chest pain. 25 tablet 3 Past Month   omeprazole (PRILOSEC) 20 MG capsule Take 1 capsule by mouth daily.   05/20/2023 Morning   Rivaroxaban (XARELTO) 15 MG TABS tablet Take 1 tablet (15 mg total) by mouth daily with supper. (Patient taking differently: Take 15 mg by mouth at bedtime.) 30 tablet 6 05/19/2023 Bedtime   rosuvastatin (CRESTOR) 40 MG tablet Take 1 tablet (40 mg total) by mouth daily. NEEDS FOLLOW UP APPOINTMENT FOR MORE REFILLS (Patient taking differently: Take 40 mg by mouth at bedtime.) 90 tablet 0 05/19/2023 Bedtime   sertraline (ZOLOFT) 50 MG tablet Take 50 mg by mouth at bedtime.   05/19/2023 Bedtime   Continuous Glucose Sensor (FREESTYLE LIBRE 3 SENSOR) MISC 1 each by Does not apply route every 14 (fourteen) days. 6 each 3    magnesium oxide (MAG-OX) 400 (240 Mg) MG tablet TAKE 1 TABLET BY MOUTH TWICE A DAY (Patient not taking: Reported on 05/20/2023) 180 tablet 1 Not Taking   Scheduled:   amiodarone  200 mg Per Tube Daily   aspirin  81 mg Per Tube Daily   atorvastatin  80 mg Per Tube Daily   bisacodyl  10 mg Rectal Once   busPIRone  5 mg Per Tube BID   carvedilol  6.25 mg Per Tube BID WC   Chlorhexidine Gluconate Cloth  6 each Topical Daily   famotidine  20 mg Per Tube Daily   free water  200 mL Per Tube Q4H   gabapentin  200 mg Per Tube Q12H   insulin aspart  0-20 Units Subcutaneous Q4H   insulin aspart  4 Units Subcutaneous Q4H   insulin glargine  12 Units Subcutaneous BID   ipratropium-albuterol  3 mL Nebulization Q6H   metolazone  10 mg Per Tube BID   mouth rinse  15 mL Mouth Rinse 4 times per day   pantoprazole (PROTONIX) IV  40  mg Intravenous QHS   polyethylene glycol  17 g Per Tube BID   predniSONE  10 mg Per Tube Q breakfast   sennosides  10 mL Per Tube BID   sertraline  50 mg Per Tube QHS   thiamine  100 mg Per Tube Daily    Assessment: 78 yo female here with VT and ICD shock  noted with, VDRF and AKI. 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 Pharmacy consulted to dose heparin.   Heparin level is therapeutic at 0.33 on heparin drip rate 1200 units/hr. Hgb stable 9s plt  trending down to 65 No s/sx of bleeding or infusion issues. Running peripherally and drawn from A-line per RN  Dopplers negative for DVT B/L 3/8 Will check HIT antibody and change to bivalirudin - watch PLTC  Goal of Therapy:  Heparin level 0.3-0.7 units/mL Monitor platelets by anticoagulation protocol: Yes   Plan:  Stop heparin  Bivalirudin 0.03mg /kg/hr  Check aptt in 6hr  Watch Plts - check HIT antibody Daily aptt and cbc  Monitor s/s bleeding     Leota Sauers Pharm.D. CPP, BCPS Clinical Pharmacist [phone] 05/30/2023 7:52 AM     PM addendum  Bivalirudin 0.03mg /kg/hour Aptt 70sec at goal  No changes  Leota Sauers Pharm.D. CPP, BCPS Clinical Pharmacist [phone] 05/30/2023 4:32 PM

## 2023-05-30 NOTE — Progress Notes (Signed)
 Pt transported on vent from 2H11 to CT and back with no complications. RN at bedside, RT will monitor as needed.

## 2023-05-30 NOTE — Progress Notes (Signed)
 NAME:  [name], MRN:  [id number], DOB:  19-Apr-1945, LOS: 9 ADMISSION DATE:  05/20/2023, CONSULTATION DATE:  05/20/2023 REFERRING MD:  Alinda Money - [facility] CHIEF COMPLAINT:  N/V/Abd pain / Resp failure   History of Present Illness:  78 year old female with medical history significant of hypertension, hyperlipidemia, diabetes, neuropathy, hypothyroidism, CKD 3B, CAD, GERD, CVA, chronic systolic CHF, depression, anxiety, ventricular tachycardia, ventricular fibrillation, atrial fibrillation, status post ICD, obesity, seizure disorder, low back pain, spinal stenosis presenting after feeling unwell for several days and recent ICD discharges.   Patient reports that she has been unwell for possible days.  She is experience nausea vomiting and abdominal pain.  She reports that her defibrillator fired multiple times today.  ED provider confirmed this with the [facility] representative who noted that the device did fire 3 times a day for ventricular fibrillation.  Also noted periods of slow ventricular tachycardia as well as atrial fibrillation.   Patient denies fevers, chills.   ED Course: Vital signs in the ED notable for blood pressure initially in the 70 systolic now improved to the 120s.  Respiratory rate in the 20s.  Requiring 3 L to maintain saturations.  Lab workup included CMP with potassium 3.2, bicarb 18, gap 17, BUN 32, creatinine elevated 2.37 baseline of 2, glucose 2 1, calcium 7.9, protein 6.0, albumin 2.5, T. bili 1.9, AST 1076, ALT 663.  CBC with platelets 142.  BNP elevated to 2548.  Troponin trend 339, 417.  Lipase normal.  Respiratory panel positive for flu A.  Lactic acid pending.  Urinalysis pending.  Blood cultures pending.  VBG with normal pH pCO2 40.7.  Magnesium pending.  Chest x-ray with new bilateral opacities consistent with pneumonia.  CT of the abdomen pelvis showing no acute normality in the abdomen pelvis, bilateral opacity at the lung bases, left renal cysts.  Patient received  ceftriaxone, doxycycline, Tamiflu in the ED.  Started on amiodarone infusion.  Also received morphine, 1 L IV fluids and 40 mEq p.o. potassium x 2.  Cardiology consulted and recommended amiodarone fusion, repletion of magnesium and potassium as needed, adding back beta-blocker as blood pressure allows.  Pertinent Medical History:   has a past medical history of Acute kidney injury superimposed on chronic kidney disease (HCC) (10/02/2022), Allergy, Arthritis, Atrial fibrillation (HCC), Chronic renal insufficiency, Chronic systolic CHF (congestive heart failure) (HCC) (03/20/2013), Chronic systolic heart failure (HCC), Coronary artery disease (01/31/2011), Diabetes mellitus, 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, History of chicken pox, Hyperlipidemia, Hypertension, Ischemic cardiomyopathy, Knee MCL sprain (03/28/2014), Left knee pain (08/30/2012), Lumbar spondylosis (01/11/2012), Pes anserine bursitis (02/23/2014), Sleep apnea, Stroke (HCC) (2010), Suicide attempt by benzodiazepine overdose (HCC) (01/21/2020), UTI (urinary tract infection) (08/03/2013), and Ventricular tachycardia (HCC).   Significant Hospital Events: Including procedures, antibiotic start and stop dates in addition to other pertinent events   2/27 Presented to ED feeling unwell, s/p AICD discharge x 3 for VT/slow VT. Hypotensive with increased WOB prompting intubation. Flu A+,  CXR c/w PNA. 2/28 Remains on high-dose vasopressor support with Levophed. No more episodes of V. Tach. Remain acidotic mixed metabolic and respiratory. 3/1 Remains on ventilator with FiO2 50 PEEP 8, remains on both levo and vaso 3/3 extubated to bipap 3/4 treated w/ lasix still on NIPPV 3/5 inc'd WOB. CXR still w/ marked bilateral airspace disease. ABG w/ sig hypoxia and resp alk. PEEP inc'd on NIPPV. Increased lasix. Widened ABX coverage due to clinical decline  3/6 attempting short rotations of [facility] but still w/  marked WOB.  Goals of care discussion  with family they continued to request aggressive care.  Shared with them my concern about limiting factor would be renal function, given underlying cardiomyopathy she would be a poor candidate for dialysis 3/7 renal function a little worse CVP 6, using albumin and Lasix to try to mobilize total body overload still BiPAP dependent  Interim History / Subjective:  Remains BIPAP dependent, acidemic, now not able to keep up with intake.  Objective:  Blood pressure (!) 112/48, pulse 61, temperature (!) 96.4 F (35.8 C), resp. rate (!) 27, height 5\' 2"  (1.575 m), weight 89.2 kg, SpO2 98%. CVP:  [4 mmHg-24 mmHg] 13 mmHg  Vent Mode: PCV;BIPAP FiO2 (%):  [60 %-80 %] 70 % Set Rate:  [12 bmp] 12 bmp PEEP:  [5 cmH20-8 cmH20] 8 cmH20 Pressure Support:  [5 cmH20-8 cmH20] 5 cmH20   Intake/Output Summary (Last 24 hours) at 05/30/2023 0803 Last data filed at 05/30/2023 0700 Gross per 24 hour  Intake 4827.05 ml  Output 3475 ml  Net 1352.05 ml   Filed Weights   05/28/23 0500 05/29/23 0500 05/30/23 0600  Weight: 93 kg 92.7 kg 89.2 kg   Physical Examination: Tracking but not really answering questions Breath sounds rhonci Remains tachypneic on BIPAP, desats without Abd +bs, rectal tube in place Ext warm, global anasarca  BMP essentially unchanged Plts continue downward trend  Resolved Hospital Problem List:  Septic shock Shock liver, improved  Assessment & Plan:  Acute respiratory failure with hypoxia and hypercapnia due to  Influenza pneumonia (tamiflu completed) and Bilateral superimposed bacterial pneumonia, presumed ongoing volume overload, deconditioning.  Extubated 3/3 but has remained BIPAP-dependent since.  Really not bouncing back and unclear reason why; per son, no breathing issues before 2 weeks ago. Pct downtrending AKI on CKD4- stubborn to diuresis Worsening thrombocytopenia- duplex neg; I think we are obligated to switch to bival and check HIT DM2 with hyperglycemia HFrEF,  Afib/flutter on xarelto, hx of VT w/ ICD in place- recent ablation Jan 2025 Muscular deconditioning Multiple pressure ulcers- developed over past week  - Check HIT, start argatroban - Intubate, bronch, CT C/A/P, check ESR, CVP, coox, echo - Cefepime end date 3/10 - PMT has met with family, full scope desired for now - May need heart failure input depending on above studies  Best Practice (right click and "Reselect all SmartList Selections" daily)   Diet/type: NPO, getting tubefeeds DVT prophylaxis bival Pressure ulcer(s): N/A GI prophylaxis: PPI, dc pepcid Lines: right internal jugular CVL - 05/20/23 Foley: Yes needed Code Status:  full code Last date of multidisciplinary goals of care discussion: 3/8, continue aggressive care  Critical care time: 39 min

## 2023-05-30 NOTE — Progress Notes (Signed)
  Echocardiogram 2D Echocardiogram has been performed.  Delcie Roch 05/30/2023, 5:17 PM

## 2023-05-30 NOTE — Progress Notes (Signed)
 Came bedside for stat echo, but nurse said patient must be intubated at this time.  Please call echo lab when patient is ready for stat echo.

## 2023-05-30 NOTE — Procedures (Addendum)
 Intubation Procedure Note  [name]  [id number]  1945-11-22  Date:05/30/23  Time:3:30 PM   Provider Performing:Adin Lariccia Erby Pian    Procedure: Intubation (31500)  Indication(s) Respiratory Failure  Consent From son on phone   Anesthesia Etomidate, Versed, Fentanyl, and Rocuronium   Time Out Verified patient identification, verified procedure, site/side was marked, verified correct patient position, special equipment/implants available, medications/allergies/relevant history reviewed, required imaging and test results available.   Sterile Technique Usual hand hygeine, masks, and gloves were used   Procedure Description Patient positioned in bed supine.  Sedation given as noted above.  Patient was intubated with endotracheal tube using Glidescope.  View was Grade 1 full glottis .  Number of attempts was 1.  Colorimetric CO2 detector was consistent with tracheal placement.   Complications/Tolerance None; patient tolerated the procedure well. Chest X-ray is ordered to verify placement.   EBL Minimal   Specimen(s) None

## 2023-05-30 NOTE — Progress Notes (Signed)
  Echocardiogram 2D Echocardiogram has been performed.  Delcie Roch 05/30/2023, 10:10 AM

## 2023-05-30 NOTE — Progress Notes (Signed)
 05/30/2023 Echo, imaging reviewed: I guess treat as edema.  BAL not c/w DAH. ESR neg, Pct heading in right direction so does not seem c/w inflammatory lung disease or bacterial infection.  No role for broadening abx or restarting steroids at present. Will see what grows on BAL. Check bubble study Vent support, diuretic push, and rest for now while we sort things out.  Myrla Halsted MD PCCM

## 2023-05-30 NOTE — Procedures (Signed)
 Bronchoscopy Procedure Note  [name]  [id number]  1946-03-17  Date:05/30/23  Time:3:31 PM   Provider Performing:Dana Debo C Katrinka Blazing   Procedure(s):  Flexible bronchoscopy with bronchial alveolar lavage (78295)  Indication(s) ARDS  Consent Verbal husband phone  Anesthesia In place for ETT   Time Out Verified patient identification, verified procedure, site/side was marked, verified correct patient position, special equipment/implants available, medications/allergies/relevant history reviewed, required imaging and test results available.   Sterile Technique Usual hand hygiene, masks, gowns, and gloves were used   Procedure Description Advanced down ETT Thick inspissated mucus occluding bilateral airways worse in upper lobes BAL performed in area of RLL (sup seg) that looked more purulent although return was more just cloudy + plugs All most c/w aspirated/retained oral secretions in patient on BIPAP for multiple days  Complications/Tolerance None; patient tolerated the procedure well. Chest X-ray is not needed post procedure.   EBL Minimal   Specimen(s) RLL BAL

## 2023-05-31 DIAGNOSIS — N184 Chronic kidney disease, stage 4 (severe): Secondary | ICD-10-CM | POA: Diagnosis not present

## 2023-05-31 DIAGNOSIS — J9602 Acute respiratory failure with hypercapnia: Secondary | ICD-10-CM | POA: Diagnosis not present

## 2023-05-31 DIAGNOSIS — J9601 Acute respiratory failure with hypoxia: Secondary | ICD-10-CM | POA: Diagnosis not present

## 2023-05-31 DIAGNOSIS — N179 Acute kidney failure, unspecified: Secondary | ICD-10-CM | POA: Diagnosis not present

## 2023-05-31 DIAGNOSIS — N183 Chronic kidney disease, stage 3 unspecified: Secondary | ICD-10-CM

## 2023-05-31 DIAGNOSIS — I5023 Acute on chronic systolic (congestive) heart failure: Secondary | ICD-10-CM | POA: Diagnosis not present

## 2023-05-31 DIAGNOSIS — R570 Cardiogenic shock: Secondary | ICD-10-CM | POA: Diagnosis not present

## 2023-05-31 LAB — POCT I-STAT 7, (LYTES, BLD GAS, ICA,H+H)
Acid-base deficit: 6 mmol/L — ABNORMAL HIGH (ref 0.0–2.0)
Acid-base deficit: 8 mmol/L — ABNORMAL HIGH (ref 0.0–2.0)
Acid-base deficit: 7 mmol/L — ABNORMAL HIGH (ref 0.0–2.0)
Bicarbonate: 21.7 mmol/L (ref 20.0–28.0)
Bicarbonate: 20.5 mmol/L (ref 20.0–28.0)
Bicarbonate: 21.4 mmol/L (ref 20.0–28.0)
Calcium, Ion: 1.25 mmol/L (ref 1.15–1.40)
Calcium, Ion: 1.21 mmol/L (ref 1.15–1.40)
Calcium, Ion: 1.16 mmol/L (ref 1.15–1.40)
HCT: 28 % — ABNORMAL LOW (ref 36.0–46.0)
HCT: 29 % — ABNORMAL LOW (ref 36.0–46.0)
HCT: 29 % — ABNORMAL LOW (ref 36.0–46.0)
Hemoglobin: 9.5 g/dL — ABNORMAL LOW (ref 12.0–15.0)
Hemoglobin: 9.9 g/dL — ABNORMAL LOW (ref 12.0–15.0)
Hemoglobin: 9.9 g/dL — ABNORMAL LOW (ref 12.0–15.0)
O2 Saturation: 99 %
O2 Saturation: 100 %
O2 Saturation: 93 %
Patient temperature: 36.2
Patient temperature: 36.4
Patient temperature: 37.2
Potassium: 3.7 mmol/L (ref 3.5–5.1)
Potassium: 4.1 mmol/L (ref 3.5–5.1)
Potassium: 4.6 mmol/L (ref 3.5–5.1)
Sodium: 144 mmol/L (ref 135–145)
Sodium: 143 mmol/L (ref 135–145)
Sodium: 142 mmol/L (ref 135–145)
TCO2: 23 mmol/L (ref 22–32)
TCO2: 22 mmol/L (ref 22–32)
TCO2: 23 mmol/L (ref 22–32)
pCO2 arterial: 52.6 mmHg — ABNORMAL HIGH (ref 32–48)
pCO2 arterial: 52.6 mmHg — ABNORMAL HIGH (ref 32–48)
pCO2 arterial: 56.4 mmHg — ABNORMAL HIGH (ref 32–48)
pH, Arterial: 7.218 — ABNORMAL LOW (ref 7.35–7.45)
pH, Arterial: 7.195 — CL (ref 7.35–7.45)
pH, Arterial: 7.189 — CL (ref 7.35–7.45)
pO2, Arterial: 174 mmHg — ABNORMAL HIGH (ref 83–108)
pO2, Arterial: 292 mmHg — ABNORMAL HIGH (ref 83–108)
pO2, Arterial: 85 mmHg (ref 83–108)

## 2023-05-31 LAB — BASIC METABOLIC PANEL
Anion gap: 11 (ref 5–15)
Anion gap: 13 (ref 5–15)
BUN: 136 mg/dL — ABNORMAL HIGH (ref 8–23)
BUN: 146 mg/dL — ABNORMAL HIGH (ref 8–23)
CO2: 19 mmol/L — ABNORMAL LOW (ref 22–32)
CO2: 23 mmol/L (ref 22–32)
Calcium: 7.9 mg/dL — ABNORMAL LOW (ref 8.9–10.3)
Calcium: 8 mg/dL — ABNORMAL LOW (ref 8.9–10.3)
Chloride: 110 mmol/L (ref 98–111)
Chloride: 106 mmol/L (ref 98–111)
Creatinine, Ser: 3.02 mg/dL — ABNORMAL HIGH (ref 0.44–1.00)
Creatinine, Ser: 3.44 mg/dL — ABNORMAL HIGH (ref 0.44–1.00)
GFR, Estimated: 15 mL/min — ABNORMAL LOW (ref >60)
GFR, Estimated: 13 mL/min — ABNORMAL LOW (ref >60)
Glucose, Bld: 238 mg/dL — ABNORMAL HIGH (ref 70–99)
Glucose, Bld: 181 mg/dL — ABNORMAL HIGH (ref 70–99)
Potassium: 4.4 mmol/L (ref 3.5–5.1)
Potassium: 4.6 mmol/L (ref 3.5–5.1)
Sodium: 140 mmol/L (ref 135–145)
Sodium: 142 mmol/L (ref 135–145)

## 2023-05-31 LAB — POCT I-STAT EG7
Acid-base deficit: 7 mmol/L — ABNORMAL HIGH (ref 0.0–2.0)
Bicarbonate: 21.2 mmol/L (ref 20.0–28.0)
Calcium, Ion: 1.21 mmol/L (ref 1.15–1.40)
HCT: 29 % — ABNORMAL LOW (ref 36.0–46.0)
Hemoglobin: 9.9 g/dL — ABNORMAL LOW (ref 12.0–15.0)
O2 Saturation: 69 %
Patient temperature: 36
Potassium: 4.2 mmol/L (ref 3.5–5.1)
Sodium: 143 mmol/L (ref 135–145)
TCO2: 23 mmol/L (ref 22–32)
pCO2, Ven: 55.4 mmHg (ref 44–60)
pH, Ven: 7.185 — CL (ref 7.25–7.43)
pO2, Ven: 42 mmHg (ref 32–45)

## 2023-05-31 LAB — APTT: aPTT: 68 s — ABNORMAL HIGH (ref 24–36)

## 2023-05-31 LAB — BLOOD GAS, ARTERIAL
Acid-base deficit: 3.5 mmol/L — ABNORMAL HIGH (ref 0.0–2.0)
Bicarbonate: 24.6 mmol/L (ref 20.0–28.0)
Drawn by: 58991
O2 Saturation: 86.4 %
Patient temperature: 37
pCO2 arterial: 56 mmHg — ABNORMAL HIGH (ref 32–48)
pH, Arterial: 7.25 — ABNORMAL LOW (ref 7.35–7.45)
pO2, Arterial: 56 mmHg — ABNORMAL LOW (ref 83–108)

## 2023-05-31 LAB — CBC
HCT: 25.8 % — ABNORMAL LOW (ref 36.0–46.0)
Hemoglobin: 8.5 g/dL — ABNORMAL LOW (ref 12.0–15.0)
MCH: 28 pg (ref 26.0–34.0)
MCHC: 32.9 g/dL (ref 30.0–36.0)
MCV: 84.9 fL (ref 80.0–100.0)
Platelets: 76 10*3/uL — ABNORMAL LOW (ref 150–400)
RBC: 3.04 MIL/uL — ABNORMAL LOW (ref 3.87–5.11)
RDW: 21.5 % — ABNORMAL HIGH (ref 11.5–15.5)
WBC: 18.4 10*3/uL — ABNORMAL HIGH (ref 4.0–10.5)
nRBC: 0.3 % — ABNORMAL HIGH (ref 0.0–0.2)

## 2023-05-31 LAB — COOXEMETRY PANEL
Carboxyhemoglobin: 2.6 % — ABNORMAL HIGH (ref 0.5–1.5)
Methemoglobin: <0.7 % (ref 0.0–1.5)
O2 Saturation: 89.4 %
Total hemoglobin: 8.5 g/dL — ABNORMAL LOW (ref 12.0–16.0)

## 2023-05-31 LAB — GLUCOSE, CAPILLARY
Glucose-Capillary: 128 mg/dL — ABNORMAL HIGH (ref 70–99)
Glucose-Capillary: 166 mg/dL — ABNORMAL HIGH (ref 70–99)
Glucose-Capillary: 193 mg/dL — ABNORMAL HIGH (ref 70–99)
Glucose-Capillary: 202 mg/dL — ABNORMAL HIGH (ref 70–99)
Glucose-Capillary: 232 mg/dL — ABNORMAL HIGH (ref 70–99)
Glucose-Capillary: 251 mg/dL — ABNORMAL HIGH (ref 70–99)

## 2023-05-31 LAB — PHOSPHORUS: Phosphorus: 7.6 mg/dL — ABNORMAL HIGH (ref 2.5–4.6)

## 2023-05-31 LAB — MAGNESIUM: Magnesium: 2 mg/dL (ref 1.7–2.4)

## 2023-05-31 LAB — BRAIN NATRIURETIC PEPTIDE: B Natriuretic Peptide: 1837.9 pg/mL — ABNORMAL HIGH (ref 0.0–100.0)

## 2023-05-31 MED ORDER — SODIUM BICARBONATE 8.4 % IV SOLN
INTRAVENOUS | Status: AC
  Filled 2023-05-31: qty 50

## 2023-05-31 MED ORDER — AMIODARONE HCL 200 MG PO TABS
200.0000 mg | ORAL_TABLET | Freq: Every day | ORAL | Status: DC
  Administered 2023-05-31: 200 mg via ORAL
  Filled 2023-05-31 (×2): qty 1

## 2023-05-31 MED ORDER — PROPOFOL 1000 MG/100ML IV EMUL
5.0000 ug/kg/min | INTRAVENOUS | Status: DC
  Administered 2023-05-31: 15 ug/kg/min via INTRAVENOUS
  Administered 2023-05-31: 5 ug/kg/min via INTRAVENOUS
  Administered 2023-06-01: 35 ug/kg/min via INTRAVENOUS
  Administered 2023-06-01 (×2): 25 ug/kg/min via INTRAVENOUS
  Administered 2023-06-01: 30 ug/kg/min via INTRAVENOUS
  Administered 2023-06-02: 60 ug/kg/min via INTRAVENOUS
  Administered 2023-06-02 (×2): 30 ug/kg/min via INTRAVENOUS
  Administered 2023-06-02: 60 ug/kg/min via INTRAVENOUS
  Administered 2023-06-03: 20 ug/kg/min via INTRAVENOUS
  Administered 2023-06-03 (×3): 30 ug/kg/min via INTRAVENOUS
  Administered 2023-06-03: 40 ug/kg/min via INTRAVENOUS
  Administered 2023-06-04 (×3): 60 ug/kg/min via INTRAVENOUS
  Filled 2023-05-31 (×4): qty 100
  Filled 2023-05-31: qty 200
  Filled 2023-05-31 (×3): qty 100
  Filled 2023-05-31: qty 200
  Filled 2023-05-31 (×7): qty 100

## 2023-05-31 MED ORDER — FUROSEMIDE 10 MG/ML IJ SOLN
160.0000 mg | Freq: Once | INTRAVENOUS | Status: AC
  Administered 2023-05-31: 160 mg via INTRAVENOUS
  Filled 2023-05-31: qty 16

## 2023-05-31 MED ORDER — FENTANYL BOLUS VIA INFUSION
25.0000 ug | INTRAVENOUS | Status: DC | PRN
  Administered 2023-05-31 – 2023-06-01 (×2): 25 ug via INTRAVENOUS
  Administered 2023-06-02: 50 ug via INTRAVENOUS
  Administered 2023-06-02: 25 ug via INTRAVENOUS
  Administered 2023-06-02 – 2023-06-03 (×3): 50 ug via INTRAVENOUS
  Administered 2023-06-03: 75 ug via INTRAVENOUS
  Administered 2023-06-03: 25 ug via INTRAVENOUS
  Administered 2023-06-03: 100 ug via INTRAVENOUS
  Administered 2023-06-03: 50 ug via INTRAVENOUS
  Administered 2023-06-03: 25 ug via INTRAVENOUS
  Administered 2023-06-03: 50 ug via INTRAVENOUS
  Administered 2023-06-04 (×6): 100 ug via INTRAVENOUS

## 2023-05-31 MED ORDER — FUROSEMIDE 10 MG/ML IJ SOLN
30.0000 mg/h | INTRAVENOUS | Status: DC
  Administered 2023-05-31 – 2023-06-01 (×4): 30 mg/h via INTRAVENOUS
  Filled 2023-05-31 (×5): qty 20

## 2023-05-31 MED ORDER — SODIUM BICARBONATE 8.4 % IV SOLN
100.0000 meq | Freq: Once | INTRAVENOUS | Status: AC
  Administered 2023-05-31: 100 meq via INTRAVENOUS
  Filled 2023-05-31: qty 50

## 2023-05-31 MED ORDER — SODIUM CHLORIDE 0.9 % IV SOLN
2.0000 g | INTRAVENOUS | Status: AC
  Filled 2023-05-31: qty 12.5

## 2023-05-31 MED ORDER — INSULIN GLARGINE 100 UNIT/ML ~~LOC~~ SOLN
15.0000 [IU] | Freq: Two times a day (BID) | SUBCUTANEOUS | Status: DC
  Administered 2023-05-31 – 2023-06-02 (×5): 15 [IU] via SUBCUTANEOUS
  Filled 2023-05-31 (×7): qty 0.15

## 2023-05-31 MED ORDER — JUVEN PO PACK
1.0000 | PACK | Freq: Two times a day (BID) | ORAL | Status: DC
  Administered 2023-06-01 – 2023-06-02 (×4): 1
  Filled 2023-05-31 (×4): qty 1

## 2023-05-31 MED ORDER — FREE WATER
50.0000 mL | Status: DC
  Administered 2023-05-31 – 2023-06-02 (×10): 50 mL

## 2023-05-31 MED ORDER — NOREPINEPHRINE 16 MG/250ML-% IV SOLN
0.0000 ug/min | INTRAVENOUS | Status: DC
  Administered 2023-05-31: 8 ug/min via INTRAVENOUS
  Administered 2023-06-01: 15 ug/min via INTRAVENOUS
  Administered 2023-06-02: 14 ug/min via INTRAVENOUS
  Administered 2023-06-04: 5 ug/min via INTRAVENOUS
  Filled 2023-05-31 (×4): qty 250

## 2023-05-31 MED ORDER — INSULIN ASPART 100 UNIT/ML IJ SOLN
7.0000 [IU] | INTRAMUSCULAR | Status: DC
  Administered 2023-05-31 – 2023-06-02 (×11): 7 [IU] via SUBCUTANEOUS

## 2023-05-31 NOTE — Progress Notes (Signed)
 PHARMACY - ANTICOAGULATION CONSULT NOTE  Pharmacy Consult for heparin  > Bivalirudin  Indication: atrial fibrillation  Allergies  Allergen Reactions   Veltassa [Patiromer] Diarrhea   Aldactone [Spironolactone] Diarrhea and Nausea And Vomiting   Darvon [Propoxyphene] Other (See Comments)    Indigestion    Mexitil [Mexiletine] Nausea And Vomiting and Other (See Comments)    Tremors Difficulty walking Blurred vision   Phenergan [Promethazine Hcl] Other (See Comments)    Hyperactivity    Ranexa [Ranolazine Er] Diarrhea   Plavix [Clopidogrel Bisulfate] Rash    Patient Measurements: Height: 5\' 2"  (157.5 cm) Weight: 89.2 kg (196 lb 10.4 oz) IBW/kg (Calculated) : 50.1 HEPARIN DW (KG): 68.6   Vital Signs: Temp: 96.6 F (35.9 C) (03/10 0600) Temp Source: Bladder (03/10 0400) BP: 149/54 (03/10 0400) Pulse Rate: 65 (03/10 0600)  Labs: Recent Labs    05/29/23 0416 05/29/23 1231 05/29/23 1735 05/30/23 0316 05/30/23 0330 05/30/23 1454 05/30/23 1859 05/31/23 0427 05/31/23 0634 05/31/23 0712  HGB 8.6*  --   --  8.1*   < >  --   --  8.5* 9.9* 9.9*  HCT 24.8*  --   --  24.0*   < >  --   --  25.8* 29.0* 29.0*  PLT 72*  --   --  65*  --   --   --  76*  --   --   APTT  --   --   --   --   --  70*  --  68*  --   --   HEPARINUNFRC 0.19* 0.19*  --  0.33  --   --   --   --   --   --   CREATININE 2.52*  --    < > 2.60*  --   --  2.92* 3.02*  --   --    < > = values in this interval not displayed.    Estimated Creatinine Clearance: 16.4 mL/min (A) (by C-G formula based on SCr of 3.02 mg/dL (H)).   Medical History: Past Medical History:  Diagnosis Date   Acute kidney injury superimposed on chronic kidney disease (HCC) 10/02/2022   Allergy    Arthritis    Atrial fibrillation (HCC)    controlled with amiodarone, on coumadin   Chronic renal insufficiency    Chronic systolic CHF (congestive heart failure) (HCC) 03/20/2013   Chronic systolic heart failure (HCC)    Coronary artery  disease 01/31/2011   Diabetes mellitus    type 1   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    History of chicken pox    Hyperlipidemia    Hypertension    Ischemic cardiomyopathy    Knee MCL sprain 03/28/2014   Left knee pain 08/30/2012   Lumbar spondylosis 01/11/2012   Pes anserine bursitis 02/23/2014   Sleep apnea    Stroke (HCC) 2010   eye doctor said she had TIA   Suicide attempt by benzodiazepine overdose (HCC) 01/21/2020   UTI (urinary tract infection) 08/03/2013   Ventricular tachycardia (HCC)    Polymorphic    Medications:  Medications Prior to Admission  Medication Sig Dispense Refill Last Dose/Taking   amiodarone (PACERONE) 200 MG tablet Take 1 tablet (200 mg total) by mouth daily. 30 tablet 6 05/20/2023 Morning   aspirin EC 81 MG tablet Take 1 tablet (81 mg total) by mouth daily. Swallow whole. 30 tablet 12 05/20/2023 Morning   busPIRone (BUSPAR) 5 MG tablet Take 1 tablet (5 mg  total) by mouth 2 (two) times daily. 60 tablet 1 05/20/2023 Morning   carvedilol (COREG) 25 MG tablet TAKE 1 TABLET (25 MG TOTAL) BY MOUTH TWICE A DAY WITH MEALS 180 tablet 3 05/20/2023 Morning   Cholecalciferol (VITAMIN D-3 PO) Take 1 tablet by mouth daily.   05/20/2023 Morning   dicyclomine (BENTYL) 10 MG capsule TAKE 1 CAPSULE (10 MG TOTAL) BY MOUTH 4 (FOUR) TIMES DAILY BEFORE MEALS AND AT BEDTIME. 120 capsule 1 05/20/2023 Morning   furosemide (LASIX) 20 MG tablet Take 2 tablets (40 mg) by mouth once a day. 60 tablet 11 05/20/2023 Morning   gabapentin (NEURONTIN) 100 MG capsule Take 200 mg by mouth 2 (two) times daily.   05/20/2023 Morning   insulin aspart (NOVOLOG FLEXPEN) 100 UNIT/ML FlexPen Inject up to 15 units daily under skin as advised (Patient taking differently: Inject 4-5 Units into the skin 3 (three) times daily before meals. Inject up to 15 units daily under skin as advised) 15 mL 11 Past Week   insulin degludec (TRESIBA FLEXTOUCH) 100 UNIT/ML FlexTouch Pen Inject 12-14 Units into the  skin daily. (Patient taking differently: Inject 14 Units into the skin at bedtime.) 9 mL 3 05/19/2023 Bedtime   levETIRAcetam (KEPPRA XR) 500 MG 24 hr tablet Take 1 tablet (500 mg total) by mouth at bedtime. 90 tablet 3 05/19/2023 Bedtime   losartan (COZAAR) 25 MG tablet Take 25 mg by mouth daily.   05/20/2023 Morning   Multiple Vitamins-Minerals (EYE VITAMINS PO) Take 1 tablet by mouth 2 (two) times daily.   05/20/2023 Morning   nitroGLYCERIN (NITROSTAT) 0.4 MG SL tablet Place 1 tablet (0.4 mg total) under the tongue every 5 (five) minutes as needed for chest pain. 25 tablet 3 Past Month   omeprazole (PRILOSEC) 20 MG capsule Take 1 capsule by mouth daily.   05/20/2023 Morning   Rivaroxaban (XARELTO) 15 MG TABS tablet Take 1 tablet (15 mg total) by mouth daily with supper. (Patient taking differently: Take 15 mg by mouth at bedtime.) 30 tablet 6 05/19/2023 Bedtime   rosuvastatin (CRESTOR) 40 MG tablet Take 1 tablet (40 mg total) by mouth daily. NEEDS FOLLOW UP APPOINTMENT FOR MORE REFILLS (Patient taking differently: Take 40 mg by mouth at bedtime.) 90 tablet 0 05/19/2023 Bedtime   sertraline (ZOLOFT) 50 MG tablet Take 50 mg by mouth at bedtime.   05/19/2023 Bedtime   Continuous Glucose Sensor (FREESTYLE LIBRE 3 SENSOR) MISC 1 each by Does not apply route every 14 (fourteen) days. 6 each 3    magnesium oxide (MAG-OX) 400 (240 Mg) MG tablet TAKE 1 TABLET BY MOUTH TWICE A DAY (Patient not taking: Reported on 05/20/2023) 180 tablet 1 Not Taking   Scheduled:   aspirin  81 mg Per Tube Daily   atorvastatin  80 mg Per Tube Daily   bisacodyl  10 mg Rectal Once   busPIRone  5 mg Per Tube BID   Chlorhexidine Gluconate Cloth  6 each Topical Daily   docusate  100 mg Per Tube BID   free water  200 mL Per Tube Q4H   gabapentin  200 mg Per Tube Q12H   insulin aspart  0-20 Units Subcutaneous Q4H   insulin aspart  4 Units Subcutaneous Q4H   insulin glargine  12 Units Subcutaneous BID   ipratropium-albuterol  3 mL  Nebulization Q6H   metolazone  10 mg Per Tube BID   mouth rinse  15 mL Mouth Rinse Q2H   pantoprazole (PROTONIX) IV  40 mg  Intravenous QHS   polyethylene glycol  17 g Per Tube BID   predniSONE  10 mg Per Tube Q breakfast   sennosides  10 mL Per Tube BID   sertraline  50 mg Per Tube QHS   thiamine  100 mg Per Tube Daily    Assessment: 78 yo female here with VT and ICD shock noted with, VDRF and AKI. 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 Pharmacy consulted to dose heparin.   Heparin level is therapeutic at 0.33 on heparin drip rate 1200 units/hr. Hgb stable 9s plt  trending down to 65 No s/sx of bleeding or infusion issues. Running peripherally and drawn from A-line per RN  Dopplers negative for DVT B/L 3/8. 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   aPTT 68 sec, therapeutic on 0.03 mg/kg/hr.  Hgb 9.9, pltc 76 (improved).   Goal of Therapy:  aPTT 50-85 sec Monitor platelets by anticoagulation protocol: Yes   Plan:  Continue bivalirudin 0.03mg /kg/hr  Daily aPTTs, CBC - check HIT antibody Monitor s/s bleeding   Trixie Rude, PharmD Clinical Pharmacist 05/31/2023  7:37 AM

## 2023-05-31 NOTE — H&P (View-Only) (Signed)
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 valve was not well visualized. Pulmonic valve regurgitation is moderate. No evidence of pulmonic stenosis. Aorta: The aortic root is normal in size and structure and aortic dilatation noted. There is mild dilatation of the ascending aorta, measuring 38 mm. Venous: The inferior vena cava is dilated in size with less than 50% respiratory variability, suggesting right atrial pressure of 15 mmHg. IAS/Shunts: The interatrial septum was not well visualized. Additional Comments: A device lead is visualized in the right atrium and right ventricle.  LEFT VENTRICLE PLAX 2D LVIDd:         5.00 cm     Diastology LVIDs:         4.00 cm     LV e' medial:    3.37 cm/s LV PW:         1.10 cm     LV E/e' medial:  20.0 LV IVS:        1.10 cm     LV e' lateral:   5.33 cm/s LVOT diam:     2.10 cm     LV E/e' lateral: 12.7 LV SV:          70 LV SV Index:   37 LVOT Area:     3.46 cm  LV Volumes (MOD) LV vol d, MOD A4C: 87.1 ml LV vol s, MOD A4C: 55.2 ml LV SV MOD A4C:     87.1 ml RIGHT VENTRICLE            IVC RV Basal diam:  3.10 cm    IVC diam: 2.70 cm RV S prime:     8.81 cm/s TAPSE (M-mode): 2.1 cm LEFT ATRIUM             Index  RIGHT ATRIUM           Index LA diam:        3.70 cm 1.95 cm/m   RA Area:     17.70 cm LA Vol (A2C):   81.7 ml 43.04 ml/m  RA Volume:   50.20 ml  26.45 ml/m LA Vol (A4C):   68.1 ml 35.88 ml/m LA Biplane Vol: 74.8 ml 39.41 ml/m  AORTIC VALVE                    PULMONIC VALVE AV Area (Vmax):    2.14 cm     PR End Diast Vel: 9.18 msec AV Area (Vmean):   2.24 cm AV Area (VTI):     2.38 cm AV Vmax:           172.89 cm/s AV Vmean:          97.442 cm/s AV VTI:            0.295 m AV Peak Grad:      12.0 mmHg AV Mean Grad:      4.6 mmHg LVOT Vmax:         107.00 cm/s LVOT Vmean:        63.000 cm/s LVOT VTI:          0.203 m LVOT/AV VTI ratio: 0.69 AI PHT:            695 msec  AORTA Ao Root diam: 3.00 cm Ao Asc diam:  3.80 cm MITRAL VALVE               TRICUSPID VALVE MV Area (PHT): 3.27 cm    TR Peak grad:   40.7 mmHg MV Decel Time: 232 msec    TR Vmax:        319.00 cm/s MV E velocity: 67.50 cm/s MV A velocity: 69.10 cm/s  SHUNTS MV E/A ratio:  0.98        Systemic VTI:  0.20 m                            Systemic Diam: 2.10 cm [name] Rich MD Electronically signed by [name] Rich MD Signature Date/Time: 05/30/2023/11:10:09 AM    Final    DG CHEST PORT 1 VIEW Result Date: 05/30/2023 CLINICAL DATA:  Intubated EXAM: PORTABLE CHEST 1 VIEW COMPARISON:  X-ray 05/29/2023 and older FINDINGS: Stable enteric tube and left upper chest defibrillator. New ET tube in place with tip seen proximally 2.5 cm above the carina. Film is rotated to the left. Stable cardiopericardial silhouette with diffuse interstitial and parenchymal opacities with vascular congestion. Confluent area left lung base. No pneumothorax.  IMPRESSION: New ET tube in place with tip 2.5 cm above the carina. Electronically Signed   By: Karen Kays M.D.   On: 05/30/2023 10:48     Medications:     Current Medications:  aspirin  81 mg Per Tube Daily   atorvastatin  80 mg Per Tube Daily   bisacodyl  10 mg Rectal Once   busPIRone  5 mg Per Tube BID   Chlorhexidine Gluconate Cloth  6 each Topical Daily   docusate  100 mg Per Tube BID   free water  200 mL Per Tube Q4H   gabapentin  200 mg Per Tube Q12H   insulin aspart  0-20 Units Subcutaneous Q4H   insulin aspart  4 Units Subcutaneous Q4H   insulin glargine  12 Units Subcutaneous BID  ipratropium-albuterol  3 mL Nebulization Q6H   metolazone  10 mg Per Tube BID   mouth rinse  15 mL Mouth Rinse Q2H   pantoprazole (PROTONIX) IV  40 mg Intravenous QHS   polyethylene glycol  17 g Per Tube BID   predniSONE  10 mg Per Tube Q breakfast   sennosides  10 mL Per Tube BID   sertraline  50 mg Per Tube QHS   thiamine  100 mg Per Tube Daily    Infusions:  bivalirudin (ANGIOMAX) 250 mg in sodium chloride 0.9 % 500 mL (0.5 mg/mL) infusion 0.03 mg/kg/hr (05/31/23 0500)   ceFEPime (MAXIPIME) IV Stopped (05/30/23 1442)   feeding supplement (VITAL AF 1.2 CAL) 1,000 mL (05/31/23 0600)   fentaNYL infusion INTRAVENOUS 100 mcg/hr (05/31/23 0500)   levETIRAcetam Stopped (05/30/23 2151)   norepinephrine (LEVOPHED) Adult infusion Stopped (05/31/23 0433)      Patient Profile    [name] is a 78 year old with a history of DMII,  CKD stage IV, A fib, VT, St Jude ICD, CAD, HLD, HTN, OSA, CVA, VT ablation 1/25, and HFrEF.   Admitted with VT x3 , Flu A , CAP, and shock.  Assessment/Plan  1. Shock suspect combination CAP/VT/Flu A. . Lactic acid 5.2 on admit. Placed on multiple pressors and antibiotics. . Lactic acid down to 1.  Cultures -negative.  Back on Norepi.   2. Acute Hypoxic Respiratory Failure CAP/Flu A. On antibiotics. Intubated on admit with extubation on 3/3 to Bipap.  Continued to struggle and required reintubation on 3/9. Bronchoscopy-results c/w aspiration.  3. Chronic HFrEF 12/2022 Echo EF 35-40% which is consistent with Echo this admit. BNP 1837.  Limited Echo +bubble study.  -CVP 11-12.Sluggish diuresis with high dose IV lasix.Give another 160 mg IV lasix and will start lasix drip 30 mg per hour.  On 3 mcg Norepi.  4. Thrombocytopenia -Dropped to 65-->76 -HIT pending. On Bilvalirudin.   5. PAF  -Had A flutter on admit. On amio drip and Bivalirudin.   6. AKI on CKD Stage IIIb -Creatinine up to 3 BUN 136 -Sluggish diuresis on high dose diuretics.  7. CAD Cath 11914 patent LAD stent with min stenosis, severe stenosis distal circumflex, and patent proximal mid stents  HS Trop 339>417 On aspirin and statin.  8. GOC  Palliative Care following. Remains full code  Length of Stay: 10  Tonye Becket, NP  05/31/2023, 7:23 AM  Advanced Heart Failure Team Pager [phone] (M-F; 7a - 5p)  Please contact [facility] for night-coverage after hours (4p -7a ) and weekends on [email or web address]  Patient seen with NP, I formulated the plan and agree with the above note.   Patient remains intubated today, has had influenza A and suspected secondary bacterial PNA with ARDS (possible aspiration).  She is just on cefepime now.    No further VT, she is on amiodarone.    Bivalirudin ongoing, pending HIT (on for atrial fibrillation).   Echo reviewed, EF 40% with low normal RV function, weakly positive bubble study, IVC dilated.  She has diuresed poorly with rise in creatinine and BUN, currently 136/3.02.  She has been getting IV Lasix boluses.  Baseline creatinine around 2. CVP 15. Co-ox good at 80% last check, lactate not elevated.  She is on NE 4 for MAP.   General: Intubated/sedated.  Neck: JVP difficult, no thyromegaly or thyroid nodule.  Lungs: Decreased at bases CV: Nondisplaced PMI.  Heart regular S1/S2, no S3/S4, no murmur.  1+ ankle edema.  No carotid  bruit.  Normal pedal pulses.  Abdomen: Soft, nontender, no hepatosplenomegaly, no distention.  Skin: Intact without lesions or rashes.  Neurologic: Sedated on vent.  Extremities: No clubbing or cyanosis.  HEENT: Normal.   1. Shock: Suspect primarily septic/distributive.  Last lactate was normal, co-ox 80%.  Not on inotropes.  On and off NE, currently on NE 4.  - Titrate NE as able to maintain MAP.  2. Acute on chronic systolic CHF:  Ischemic cardiomyopathy.  Echo this admission with EF 40% with low normal RV function, weakly positive bubble study, IVC dilated. This is comparable to the past.  Now with significant volume overload, CVP 15, but poor diuresis and rising creatinine (BUN 136, creatinine 3.02 today).  Suspect AKI on CKD stage 3 is playing a large role in poor diuresis.  - Give Lasix 160 mg IV x 1 now then gtt at 30 mg/hr.  Metolazone 10 mg bid.   - If she does not diurese well and creatinine rises further, will need nephrology consultation for RRT.  She was very functional at home prior to admission.  - Will arrange for RHC tomorrow.  3. Acute hypoxemic respiratory failure: Influenza A and likely secondary bacterial PNA with possible aspiration and now ARDS picture.  Also likely a component of pulmonary hypertension.  - On cefepime - Attempting diuresis as above.  4. AKI on CKD stage 3: Creatinine baseline around 2.  Now suspect ATN with AKI in setting of shock.  - Attempting to diuresis, may need CVVH if renal function continues to deteriorate with poor diuresis.  5. Atrial fibrillation/flutter: Paroxysmal.  In NSR on amiodarone gtt.  - Continue amiodarone gtt 30 mg/hr.  - Bivalirudin with ?HIT.  6. Thrombocytopenia: plts 76, mildly higher.   - Bivalirudin gtt for now, HIT pending.  7. CAD: Last cath in 7/24 with severe stenosis distal LCx managed medically, patent stents. No ACS this admission.  - Continue statin.  8. 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  Does not tolerate mexiletine.  VT  ablation 1/25.  Had VT at admission with ATP.  - Continue amiodarone gtt.   CRITICAL CARE Performed by: [name]  Total critical care time: 50 minutes  Critical care time was exclusive of separately billable procedures and treating other patients.  Critical care was necessary to treat or prevent imminent or life-threatening deterioration.  Critical care was time spent personally by me on the following activities: development of treatment plan with patient and/or surrogate as well as nursing, discussions with consultants, evaluation of patient's response to treatment, examination of patient, obtaining history from patient or surrogate, ordering and performing treatments and interventions, ordering and review of laboratory studies, ordering and review of radiographic studies, pulse oximetry and re-evaluation of patient's condition.  [name] 05/31/2023 12:29 PM

## 2023-05-31 NOTE — Progress Notes (Signed)
 NAME:  [name], MRN:  [id number], DOB:  Feb 01, 1946, LOS: 10 ADMISSION DATE:  05/20/2023, CONSULTATION DATE:  05/20/2023 REFERRING MD:  Alinda Money - [facility] CHIEF COMPLAINT:  N/V/Abd pain / Resp failure   History of Present Illness:  78 year old female with medical history significant of hypertension, hyperlipidemia, diabetes, neuropathy, hypothyroidism, CKD 3B, CAD, GERD, CVA, chronic systolic CHF, depression, anxiety, ventricular tachycardia, ventricular fibrillation, atrial fibrillation, status post ICD, obesity, seizure disorder, low back pain, spinal stenosis presenting after feeling unwell for several days and recent ICD discharges.   Patient reports that she has been unwell for possible days.  She is experience nausea vomiting and abdominal pain.  She reports that her defibrillator fired multiple times today.  ED provider confirmed this with the [facility] representative who noted that the device did fire 3 times a day for ventricular fibrillation.  Also noted periods of slow ventricular tachycardia as well as atrial fibrillation.   Patient denies fevers, chills.   ED Course: Vital signs in the ED notable for blood pressure initially in the 70 systolic now improved to the 120s.  Respiratory rate in the 20s.  Requiring 3 L to maintain saturations.  Lab workup included CMP with potassium 3.2, bicarb 18, gap 17, BUN 32, creatinine elevated 2.37 baseline of 2, glucose 2 1, calcium 7.9, protein 6.0, albumin 2.5, T. bili 1.9, AST 1076, ALT 663.  CBC with platelets 142.  BNP elevated to 2548.  Troponin trend 339, 417.  Lipase normal.  Respiratory panel positive for flu A.  Lactic acid pending.  Urinalysis pending.  Blood cultures pending.  VBG with normal pH pCO2 40.7.  Magnesium pending.  Chest x-ray with new bilateral opacities consistent with pneumonia.  CT of the abdomen pelvis showing no acute normality in the abdomen pelvis, bilateral opacity at the lung bases, left renal cysts.  Patient received  ceftriaxone, doxycycline, Tamiflu in the ED.  Started on amiodarone infusion.  Also received morphine, 1 L IV fluids and 40 mEq p.o. potassium x 2.  Cardiology consulted and recommended amiodarone fusion, repletion of magnesium and potassium as needed, adding back beta-blocker as blood pressure allows.  Pertinent Medical History:   has a past medical history of Acute kidney injury superimposed on chronic kidney disease (HCC) (10/02/2022), Allergy, Arthritis, Atrial fibrillation (HCC), Chronic renal insufficiency, Chronic systolic CHF (congestive heart failure) (HCC) (03/20/2013), Chronic systolic heart failure (HCC), Coronary artery disease (01/31/2011), Diabetes mellitus, 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, History of chicken pox, Hyperlipidemia, Hypertension, Ischemic cardiomyopathy, Knee MCL sprain (03/28/2014), Left knee pain (08/30/2012), Lumbar spondylosis (01/11/2012), Pes anserine bursitis (02/23/2014), Sleep apnea, Stroke (HCC) (2010), Suicide attempt by benzodiazepine overdose (HCC) (01/21/2020), UTI (urinary tract infection) (08/03/2013), and Ventricular tachycardia (HCC).   Significant Hospital Events: Including procedures, antibiotic start and stop dates in addition to other pertinent events   2/27 Presented to ED feeling unwell, s/p AICD discharge x 3 for VT/slow VT. Hypotensive with increased WOB prompting intubation. Flu A+,  CXR c/w PNA. 2/28 Remains on high-dose vasopressor support with Levophed. No more episodes of V. Tach. Remain acidotic mixed metabolic and respiratory. 3/1 Remains on ventilator with FiO2 50 PEEP 8, remains on both levo and vaso 3/3 extubated to bipap 3/4 treated w/ lasix still on NIPPV 3/5 inc'd WOB. CXR still w/ marked bilateral airspace disease. ABG w/ sig hypoxia and resp alk. PEEP inc'd on NIPPV. Increased lasix. Widened ABX coverage due to clinical decline  3/6 attempting short rotations of HHFNC but still w/  marked WOB.  Goals of care discussion  with family they continued to request aggressive care.  Shared with them my concern about limiting factor would be renal function, given underlying cardiomyopathy she would be a poor candidate for dialysis 3/7 renal function a little worse CVP 6, using albumin and Lasix to try to mobilize total body overload still BiPAP dependent  Interim History / Subjective:  Still air hungry on vent  Objective:  Blood pressure (!) 108/53, pulse 60, temperature (!) 96.4 F (35.8 C), resp. rate 16, height 5\' 2"  (1.575 m), weight 89.2 kg, SpO2 91%. CVP:  [7 mmHg-22 mmHg] 7 mmHg  Vent Mode: PRVC FiO2 (%):  [50 %-100 %] 70 % Set Rate:  [30 bmp] 30 bmp Vt Set:  [400 mL] 400 mL PEEP:  [10 cmH20-12 cmH20] 12 cmH20 Plateau Pressure:  [23 cmH20-25 cmH20] 25 cmH20   Intake/Output Summary (Last 24 hours) at 05/31/2023 1007 Last data filed at 05/31/2023 1000 Gross per 24 hour  Intake 3501.31 ml  Output 1735 ml  Net 1766.31 ml   Filed Weights   05/28/23 0500 05/29/23 0500 05/30/23 0600  Weight: 93 kg 92.7 kg 89.2 kg   Physical Examination: Tracking but not really answering questions Breath sounds rhonci Remains tachypneic on BIPAP, desats without Abd +bs, rectal tube in place Ext warm, global anasarca  BMP essentially unchanged Plts a little better  Resolved Hospital Problem List:  Septic shock Shock liver, improved  Assessment & Plan:  Acute respiratory failure with hypoxia and hypercapnia- odd case; flu ARDS was 10 days ago; lingering issues thought to be some combination fluid overload and HCAP but both have been treated.  Failed extubation trial and now workup shows (A) normal ESR so doubtful COP/Amio lung (B) normal Pct and (C) persistent upper lobe GGO/crazing paving, (D) elevated CVP but okay coox.  We have continued to try to diurese with minimal results; may need better idea of filling pressures. AKI on CKD4- stubborn to diuresis Worsening thrombocytopenia- duplex neg; bival start 3/9 and HIT  sent but still pretty low suspicion DM2 with hyperglycemia- improved HFrEF, Afib/flutter on xarelto, hx of VT w/ ICD in place- recent ablation Jan 2025; to restart amio given normal ESR Muscular deconditioning Multiple pressure ulcers- developed over past week  - f/u HIT, start argatroban, trend Plts - Cefepime end date 3/10 - PMT has met with family, full scope desired for now - restarting amio as gtt - attempting to push diuresis, may need RHC, AHF to eval - f/u BAL, no growth yet - Vent bundle; very high dead space so air hungry, no vent adjustments are able to compensate for this; giving some bicarb and sedation - Probably will end up needing CRRT - Guarded prognosis  Best Practice (right click and "Reselect all SmartList Selections" daily)   Diet/type: TF DVT prophylaxis bival Pressure ulcer(s): N/A GI prophylaxis: PPI Lines: right internal jugular CVL - 05/20/23 Foley: Yes needed Code Status:  full code Last date of multidisciplinary goals of care discussion: 3/8, continue aggressive care  Critical care time: 55 min

## 2023-05-31 NOTE — Progress Notes (Signed)
 Patient unable to tolerate decreased FiO2 setting. Respiratory called to bedside to re-evaluate and adjust FiO2.

## 2023-05-31 NOTE — Inpatient Diabetes Management (Signed)
 Inpatient Diabetes Program Recommendations  AACE/ADA: New Consensus Statement on Inpatient Glycemic Control   Target Ranges:  Prepandial:   less than 140 mg/dL      Peak postprandial:   less than 180 mg/dL (1-2 hours)      Critically ill patients:  140 - 180 mg/dL    Latest Reference Range & Units 05/31/23 00:34 05/31/23 04:28 05/31/23 07:59  Glucose-Capillary 70 - 99 mg/dL 161 (H) 096 (H) 045 (H)    Latest Reference Range & Units 05/30/23 03:27 05/30/23 07:52 05/30/23 13:13 05/30/23 15:07 05/30/23 21:31  Glucose-Capillary 70 - 99 mg/dL 97 88 409 (H) 811 (H) 914 (H)   Review of Glycemic Control  Current orders for Inpatient glycemic control: Lantus 12 units BID, Novolog 0-20 units Q4H, Novolog 4 units Q4H; Prednisone 10 mg QAM, Vital @ 60 ml/hr  Inpatient Diabetes Program Recommendations:    Insulin: Please consider increasing Lantus to 15 units BID and tube feeding insulin coverage to Novolog 7 units Q4H.  Thanks, Orlando Penner, RN, MSN, CDCES Diabetes Coordinator Inpatient Diabetes Program [phone] (Team Pager from 8am to 5pm)

## 2023-05-31 NOTE — Progress Notes (Signed)
 eLink Physician-Brief Progress Note Patient Name: SIERRIA BRUNEY DOB: January 24, 1946 MRN: [id number]   Date of Service  05/31/2023  HPI/Events of Note  Moderately worse pH with disproportionately worse acidemia compared to respiratory acidosis.  Suspected combined metabolic acidosis which is supported by renal failure, worsening serum bicarb.  eICU Interventions  May need RRT this morning versus bicarb drip  Fortunately, hemodynamics are better-off norepinephrine.  CVP 14.  No immediate intervention at this time.     Intervention Category Major Interventions: Acid-Base disturbance - evaluation and management  Adelee Hannula 05/31/2023, 7:07 AM

## 2023-05-31 NOTE — Consult Note (Addendum)
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 was not well visualized. Pulmonic valve regurgitation is moderate. No evidence of pulmonic stenosis. Aorta: The aortic root is normal in size and structure and aortic dilatation noted. There is mild dilatation of the ascending aorta, measuring 38 mm. Venous: The inferior vena cava is dilated in size with less than 50% respiratory variability, suggesting right atrial pressure of 15 mmHg. IAS/Shunts: The interatrial septum was not well visualized. Additional Comments: A device lead is visualized in the right atrium and right ventricle.  LEFT VENTRICLE PLAX 2D LVIDd:         5.00 cm     Diastology LVIDs:         4.00 cm     LV e' medial:    3.37 cm/s LV PW:         1.10 cm     LV E/e' medial:  20.0 LV IVS:        1.10 cm     LV e' lateral:   5.33 cm/s LVOT diam:     2.10 cm     LV E/e' lateral: 12.7 LV SV:          70 LV SV Index:   37 LVOT Area:     3.46 cm  LV Volumes (MOD) LV vol d, MOD A4C: 87.1 ml LV vol s, MOD A4C: 55.2 ml LV SV MOD A4C:     87.1 ml RIGHT VENTRICLE            IVC RV Basal diam:  3.10 cm    IVC diam: 2.70 cm RV S prime:     8.81 cm/s TAPSE (M-mode): 2.1 cm LEFT ATRIUM             Index  RIGHT ATRIUM           Index LA diam:        3.70 cm 1.95 cm/m   RA Area:     17.70 cm LA Vol (A2C):   81.7 ml 43.04 ml/m  RA Volume:   50.20 ml  26.45 ml/m LA Vol (A4C):   68.1 ml 35.88 ml/m LA Biplane Vol: 74.8 ml 39.41 ml/m  AORTIC VALVE                    PULMONIC VALVE AV Area (Vmax):    2.14 cm     PR End Diast Vel: 9.18 msec AV Area (Vmean):   2.24 cm AV Area (VTI):     2.38 cm AV Vmax:           172.89 cm/s AV Vmean:          97.442 cm/s AV VTI:            0.295 m AV Peak Grad:      12.0 mmHg AV Mean Grad:      4.6 mmHg LVOT Vmax:         107.00 cm/s LVOT Vmean:        63.000 cm/s LVOT VTI:          0.203 m LVOT/AV VTI ratio: 0.69 AI PHT:            695 msec  AORTA Ao Root diam: 3.00 cm Ao Asc diam:  3.80 cm MITRAL VALVE               TRICUSPID VALVE MV Area (PHT): 3.27 cm    TR Peak grad:   40.7 mmHg MV Decel Time: 232 msec    TR Vmax:        319.00 cm/s MV E velocity: 67.50 cm/s MV A velocity: 69.10 cm/s  SHUNTS MV E/A ratio:  0.98        Systemic VTI:  0.20 m                            Systemic Diam: 2.10 cm Dina Rich MD Electronically signed by Dina Rich MD Signature Date/Time: 05/30/2023/11:10:09 AM    Final    DG CHEST PORT 1 VIEW Result Date: 05/30/2023 CLINICAL DATA:  Intubated EXAM: PORTABLE CHEST 1 VIEW COMPARISON:  X-ray 05/29/2023 and older FINDINGS: Stable enteric tube and left upper chest defibrillator. New ET tube in place with tip seen proximally 2.5 cm above the carina. Film is rotated to the left. Stable cardiopericardial silhouette with diffuse interstitial and parenchymal opacities with vascular congestion. Confluent area left lung base. No pneumothorax.  IMPRESSION: New ET tube in place with tip 2.5 cm above the carina. Electronically Signed   By: Karen Kays M.D.   On: 05/30/2023 10:48     Medications:     Current Medications:  aspirin  81 mg Per Tube Daily   atorvastatin  80 mg Per Tube Daily   bisacodyl  10 mg Rectal Once   busPIRone  5 mg Per Tube BID   Chlorhexidine Gluconate Cloth  6 each Topical Daily   docusate  100 mg Per Tube BID   free water  200 mL Per Tube Q4H   gabapentin  200 mg Per Tube Q12H   insulin aspart  0-20 Units Subcutaneous Q4H   insulin aspart  4 Units Subcutaneous Q4H   insulin glargine  12 Units Subcutaneous BID  ipratropium-albuterol  3 mL Nebulization Q6H   metolazone  10 mg Per Tube BID   mouth rinse  15 mL Mouth Rinse Q2H   pantoprazole (PROTONIX) IV  40 mg Intravenous QHS   polyethylene glycol  17 g Per Tube BID   predniSONE  10 mg Per Tube Q breakfast   sennosides  10 mL Per Tube BID   sertraline  50 mg Per Tube QHS   thiamine  100 mg Per Tube Daily    Infusions:  bivalirudin (ANGIOMAX) 250 mg in sodium chloride 0.9 % 500 mL (0.5 mg/mL) infusion 0.03 mg/kg/hr (05/31/23 0500)   ceFEPime (MAXIPIME) IV Stopped (05/30/23 1442)   feeding supplement (VITAL AF 1.2 CAL) 1,000 mL (05/31/23 0600)   fentaNYL infusion INTRAVENOUS 100 mcg/hr (05/31/23 0500)   levETIRAcetam Stopped (05/30/23 2151)   norepinephrine (LEVOPHED) Adult infusion Stopped (05/31/23 0433)      Patient Profile    [name] is a 78 year old with a history of DMII,  CKD stage IV, A fib, VT, St Jude ICD, CAD, HLD, HTN, OSA, CVA, VT ablation 1/25, and HFrEF.   Admitted with VT x3 , Flu A , CAP, and shock.  Assessment/Plan  1. Shock suspect combination CAP/VT/Flu A. . Lactic acid 5.2 on admit. Placed on multiple pressors and antibiotics. . Lactic acid down to 1.  Cultures -negative.  Back on Norepi.   2. Acute Hypoxic Respiratory Failure CAP/Flu A. On antibiotics. Intubated on admit with extubation on 3/3 to Bipap.  Continued to struggle and required reintubation on 3/9. Bronchoscopy-results c/w aspiration.  3. Chronic HFrEF 12/2022 Echo EF 35-40% which is consistent with Echo this admit. BNP 1837.  Limited Echo +bubble study.  -CVP 11-12.Sluggish diuresis with high dose IV lasix.Give another 160 mg IV lasix and will start lasix drip 30 mg per hour.  On 3 mcg Norepi.  4. Thrombocytopenia -Dropped to 65-->76 -HIT pending. On Bilvalirudin.   5. PAF  -Had A flutter on admit. On amio drip and Bivalirudin.   6. AKI on CKD Stage IIIb -Creatinine up to 3 BUN 136 -Sluggish diuresis on high dose diuretics.  7. CAD Cath 11914 patent LAD stent with min stenosis, severe stenosis distal circumflex, and patent proximal mid stents  HS Trop 339>417 On aspirin and statin.  8. GOC  Palliative Care following. Remains full code  Length of Stay: 10  Tonye Becket, NP  05/31/2023, 7:23 AM  Advanced Heart Failure Team Pager [phone] (M-F; 7a - 5p)  Please contact [facility] for night-coverage after hours (4p -7a ) and weekends on [email or web address]  Patient seen with NP, I formulated the plan and agree with the above note.   Patient remains intubated today, has had influenza A and suspected secondary bacterial PNA with ARDS (possible aspiration).  She is just on cefepime now.    No further VT, she is on amiodarone.    Bivalirudin ongoing, pending HIT (on for atrial fibrillation).   Echo reviewed, EF 40% with low normal RV function, weakly positive bubble study, IVC dilated.  She has diuresed poorly with rise in creatinine and BUN, currently 136/3.02.  She has been getting IV Lasix boluses.  Baseline creatinine around 2. CVP 15. Co-ox good at 80% last check, lactate not elevated.  She is on NE 4 for MAP.   General: Intubated/sedated.  Neck: JVP difficult, no thyromegaly or thyroid nodule.  Lungs: Decreased at bases CV: Nondisplaced PMI.  Heart regular S1/S2, no S3/S4, no murmur.  1+ ankle edema.  No carotid  bruit.  Normal pedal pulses.  Abdomen: Soft, nontender, no hepatosplenomegaly, no distention.  Skin: Intact without lesions or rashes.  Neurologic: Sedated on vent.  Extremities: No clubbing or cyanosis.  HEENT: Normal.   1. Shock: Suspect primarily septic/distributive.  Last lactate was normal, co-ox 80%.  Not on inotropes.  On and off NE, currently on NE 4.  - Titrate NE as able to maintain MAP.  2. Acute on chronic systolic CHF:  Ischemic cardiomyopathy.  Echo this admission with EF 40% with low normal RV function, weakly positive bubble study, IVC dilated. This is comparable to the past.  Now with significant volume overload, CVP 15, but poor diuresis and rising creatinine (BUN 136, creatinine 3.02 today).  Suspect AKI on CKD stage 3 is playing a large role in poor diuresis.  - Give Lasix 160 mg IV x 1 now then gtt at 30 mg/hr.  Metolazone 10 mg bid.   - If she does not diurese well and creatinine rises further, will need nephrology consultation for RRT.  She was very functional at home prior to admission.  - Will arrange for RHC tomorrow.  3. Acute hypoxemic respiratory failure: Influenza A and likely secondary bacterial PNA with possible aspiration and now ARDS picture.  Also likely a component of pulmonary hypertension.  - On cefepime - Attempting diuresis as above.  4. AKI on CKD stage 3: Creatinine baseline around 2.  Now suspect ATN with AKI in setting of shock.  - Attempting to diuresis, may need CVVH if renal function continues to deteriorate with poor diuresis.  5. Atrial fibrillation/flutter: Paroxysmal.  In NSR on amiodarone gtt.  - Continue amiodarone gtt 30 mg/hr.  - Bivalirudin with ?HIT.  6. Thrombocytopenia: plts 76, mildly higher.   - Bivalirudin gtt for now, HIT pending.  7. CAD: Last cath in 7/24 with severe stenosis distal LCx managed medically, patent stents. No ACS this admission.  - Continue statin.  8. 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  Does not tolerate mexiletine.  VT  ablation 1/25.  Had VT at admission with ATP.  - Continue amiodarone gtt.   CRITICAL CARE Performed by: [name]  Total critical care time: 50 minutes  Critical care time was exclusive of separately billable procedures and treating other patients.  Critical care was necessary to treat or prevent imminent or life-threatening deterioration.  Critical care was time spent personally by me on the following activities: development of treatment plan with patient and/or surrogate as well as nursing, discussions with consultants, evaluation of patient's response to treatment, examination of patient, obtaining history from patient or surrogate, ordering and performing treatments and interventions, ordering and review of laboratory studies, ordering and review of radiographic studies, pulse oximetry and re-evaluation of patient's condition.  [name] 05/31/2023 12:29 PM

## 2023-05-31 NOTE — Progress Notes (Signed)
 EP brief progress note -    Patient re-intubated over the weekend d/t increased WOB.   Tele reviewed - SR with PVCs  Continue amio gtt until able to tolerate PO consistently.    EP will continue to follow peripherally    Sherie Don, NP Electrophysiology 05/31/23 7:40 AM

## 2023-05-31 NOTE — Progress Notes (Signed)
 Nutrition Follow-up  DOCUMENTATION CODES:   Not applicable  INTERVENTION:   Continue Vital 1.2@60ml /hr continuous   Free water flushes 50ml q4 hours   Regimen provides 1728kcal/day, 108g/day protein and 1446ml/day of free water   Juven Fruit Punch BID via tube, each serving provides 95kcal and 2.5g of protein (amino acids glutamine and arginine)  Daily weights   NUTRITION DIAGNOSIS:   Inadequate oral intake related to acute illness as evidenced by NPO status. -ongoing   GOAL:   Provide needs based on ASPEN/SCCM guidelines -met   MONITOR:   Vent status, Labs, Weight trends, TF tolerance, I & O's, Skin  ASSESSMENT:   78 yo female admitted with acute respiratory failure with bacterial pneumonia in addition to Flu A+, severe sepsis with shock, AKI on CKD stage 3b, shock liver. PMH includes chronic biventricular heart failure (EF 35-40% 12/2022), DM  -Pt extubated 3/3 -Pt s/p cortrak tube placement 3/5 -Pt s/p bronchoscopy and re-intubation 3/9  Pt re-intubated 3/9 for increased work of breathing. Cortrak remains in place. Pt is tolerating tube feeds well at goal rate. Pt with worsening renal failure and may need CRRT per MD note. Per chart, pt is up ~17lbs since admission and +11.2L on her I & Os. Pt is noted to have severe edema. Will decrease free water flushes.    Medications reviewed and include: aspirin, colace, insulin, protonix, miralax, prednisone, senokot, thiamine lasix, levophed  Labs reviewed: Na 143 wnl, K 4.1 wnl, BUN 136(H), creat 3.02(H), P 7.6(H), Mg 2.0 wnl  BNP- 1837.9(H) Wbc- 18.4(H), Hgb 9.9(L), Hct 29.0(L) Cbgs- 202, 193, 232, 251 x 24 hrs   Patient is currently intubated on ventilator support MV: 9.4 L/min Temp (24hrs), Avg:97.6 F (36.4 C), Min:96.4 F (35.8 C), Max:98.8 F (37.1 C)  Propofol: 2.68 ml/hr- provides 71kcal/day   MAP >26mmHg   UOP-   NUTRITION - FOCUSED PHYSICAL EXAM:  Flowsheet Row Most Recent Value  Orbital  Region No depletion  Upper Arm Region Unable to assess  Thoracic and Lumbar Region No depletion  Buccal Region No depletion  Temple Region Mild depletion  Clavicle Bone Region No depletion  Clavicle and Acromion Bone Region No depletion  Scapular Bone Region No depletion  Dorsal Hand Unable to assess  Patellar Region Unable to assess  Anterior Thigh Region Unable to assess  Posterior Calf Region Unable to assess  Edema (RD Assessment) Severe  Hair Reviewed  Eyes Reviewed  Mouth Reviewed  Skin Reviewed  Nails Reviewed   Diet Order:   Diet Order             Diet NPO time specified  Diet effective now                  EDUCATION NEEDS:   Not appropriate for education at this time  Skin:  Skin Assessment: Skin Integrity Issues: Skin Integrity Issues:: DTI, Other (Comment) DTI: L sacrum Other: MASD to vertebral column  Last BM:  3/10- via rectal tube  Height:   Ht Readings from Last 1 Encounters:  05/20/23 5\' 2"  (1.575 m)    Weight:   Wt Readings from Last 1 Encounters:  05/30/23 89.2 kg   BMI:  Body mass index is 35.97 kg/m.  Estimated Nutritional Needs:   Kcal:  1600-1800kcal/day  Protein:  >100g/day  Fluid:  1.3-1.5L/day  Betsey Holiday MS, RD, LDN If unable to be reached, please send secure chat to "RD inpatient" available from 8:00a-4:00p daily

## 2023-06-01 ENCOUNTER — Inpatient Hospital Stay (HOSPITAL_COMMUNITY)

## 2023-06-01 ENCOUNTER — Inpatient Hospital Stay (HOSPITAL_COMMUNITY): Admission: EM | Disposition: E | Payer: Self-pay | Source: Home / Self Care | Attending: Internal Medicine

## 2023-06-01 ENCOUNTER — Telehealth (HOSPITAL_COMMUNITY): Payer: Self-pay

## 2023-06-01 DIAGNOSIS — I700 Atherosclerosis of aorta: Secondary | ICD-10-CM | POA: Diagnosis not present

## 2023-06-01 DIAGNOSIS — R0902 Hypoxemia: Secondary | ICD-10-CM | POA: Diagnosis not present

## 2023-06-01 DIAGNOSIS — I509 Heart failure, unspecified: Secondary | ICD-10-CM

## 2023-06-01 DIAGNOSIS — J101 Influenza due to other identified influenza virus with other respiratory manifestations: Secondary | ICD-10-CM | POA: Diagnosis not present

## 2023-06-01 DIAGNOSIS — E119 Type 2 diabetes mellitus without complications: Secondary | ICD-10-CM | POA: Diagnosis not present

## 2023-06-01 DIAGNOSIS — Z7189 Other specified counseling: Secondary | ICD-10-CM | POA: Diagnosis not present

## 2023-06-01 DIAGNOSIS — Z515 Encounter for palliative care: Secondary | ICD-10-CM | POA: Diagnosis not present

## 2023-06-01 DIAGNOSIS — J929 Pleural plaque without asbestos: Secondary | ICD-10-CM | POA: Diagnosis not present

## 2023-06-01 DIAGNOSIS — R570 Cardiogenic shock: Secondary | ICD-10-CM | POA: Diagnosis not present

## 2023-06-01 DIAGNOSIS — J9602 Acute respiratory failure with hypercapnia: Secondary | ICD-10-CM | POA: Diagnosis not present

## 2023-06-01 DIAGNOSIS — N179 Acute kidney failure, unspecified: Secondary | ICD-10-CM | POA: Diagnosis not present

## 2023-06-01 DIAGNOSIS — Z4682 Encounter for fitting and adjustment of non-vascular catheter: Secondary | ICD-10-CM | POA: Diagnosis not present

## 2023-06-01 DIAGNOSIS — I517 Cardiomegaly: Secondary | ICD-10-CM | POA: Diagnosis not present

## 2023-06-01 DIAGNOSIS — I5023 Acute on chronic systolic (congestive) heart failure: Secondary | ICD-10-CM | POA: Diagnosis not present

## 2023-06-01 DIAGNOSIS — N184 Chronic kidney disease, stage 4 (severe): Secondary | ICD-10-CM | POA: Diagnosis not present

## 2023-06-01 DIAGNOSIS — Z452 Encounter for adjustment and management of vascular access device: Secondary | ICD-10-CM | POA: Diagnosis not present

## 2023-06-01 DIAGNOSIS — J9601 Acute respiratory failure with hypoxia: Secondary | ICD-10-CM | POA: Diagnosis not present

## 2023-06-01 HISTORY — PX: RIGHT HEART CATH: CATH118263

## 2023-06-01 LAB — POCT I-STAT 7, (LYTES, BLD GAS, ICA,H+H)
Acid-base deficit: 4 mmol/L — ABNORMAL HIGH (ref 0.0–2.0)
Acid-base deficit: 3 mmol/L — ABNORMAL HIGH (ref 0.0–2.0)
Acid-base deficit: 4 mmol/L — ABNORMAL HIGH (ref 0.0–2.0)
Bicarbonate: 23.9 mmol/L (ref 20.0–28.0)
Bicarbonate: 25.4 mmol/L (ref 20.0–28.0)
Bicarbonate: 24.8 mmol/L (ref 20.0–28.0)
Calcium, Ion: 1.12 mmol/L — ABNORMAL LOW (ref 1.15–1.40)
Calcium, Ion: 1.08 mmol/L — ABNORMAL LOW (ref 1.15–1.40)
Calcium, Ion: 1.1 mmol/L — ABNORMAL LOW (ref 1.15–1.40)
HCT: 36 % (ref 36.0–46.0)
HCT: 28 % — ABNORMAL LOW (ref 36.0–46.0)
HCT: 28 % — ABNORMAL LOW (ref 36.0–46.0)
Hemoglobin: 12.2 g/dL (ref 12.0–15.0)
Hemoglobin: 9.5 g/dL — ABNORMAL LOW (ref 12.0–15.0)
Hemoglobin: 9.5 g/dL — ABNORMAL LOW (ref 12.0–15.0)
O2 Saturation: 93 %
O2 Saturation: 68 %
O2 Saturation: 99 %
Patient temperature: 37
Potassium: 4.1 mmol/L (ref 3.5–5.1)
Potassium: 4.3 mmol/L (ref 3.5–5.1)
Potassium: 4.2 mmol/L (ref 3.5–5.1)
Sodium: 141 mmol/L (ref 135–145)
Sodium: 143 mmol/L (ref 135–145)
Sodium: 142 mmol/L (ref 135–145)
TCO2: 26 mmol/L (ref 22–32)
TCO2: 27 mmol/L (ref 22–32)
TCO2: 27 mmol/L (ref 22–32)
pCO2 arterial: 55.9 mmHg — ABNORMAL HIGH (ref 32–48)
pCO2 arterial: 66.4 mmHg (ref 32–48)
pCO2 arterial: 62.2 mmHg — ABNORMAL HIGH (ref 32–48)
pH, Arterial: 7.238 — ABNORMAL LOW (ref 7.35–7.45)
pH, Arterial: 7.19 — CL (ref 7.35–7.45)
pH, Arterial: 7.208 — ABNORMAL LOW (ref 7.35–7.45)
pO2, Arterial: 79 mmHg — ABNORMAL LOW (ref 83–108)
pO2, Arterial: 45 mmHg — ABNORMAL LOW (ref 83–108)
pO2, Arterial: 178 mmHg — ABNORMAL HIGH (ref 83–108)

## 2023-06-01 LAB — MRSA NEXT GEN BY PCR, NASAL: MRSA by PCR Next Gen: NOT DETECTED

## 2023-06-01 LAB — RENAL FUNCTION PANEL
Albumin: 2.5 g/dL — ABNORMAL LOW (ref 3.5–5.0)
Anion gap: 15 (ref 5–15)
BUN: 143 mg/dL — ABNORMAL HIGH (ref 8–23)
CO2: 21 mmol/L — ABNORMAL LOW (ref 22–32)
Calcium: 7.6 mg/dL — ABNORMAL LOW (ref 8.9–10.3)
Chloride: 100 mmol/L (ref 98–111)
Creatinine, Ser: 3 mg/dL — ABNORMAL HIGH (ref 0.44–1.00)
GFR, Estimated: 16 mL/min — ABNORMAL LOW (ref >60)
Glucose, Bld: 249 mg/dL — ABNORMAL HIGH (ref 70–99)
Phosphorus: 7 mg/dL — ABNORMAL HIGH (ref 2.5–4.6)
Potassium: 4.9 mmol/L (ref 3.5–5.1)
Sodium: 136 mmol/L (ref 135–145)

## 2023-06-01 LAB — PHOSPHORUS: Phosphorus: 8.1 mg/dL — ABNORMAL HIGH (ref 2.5–4.6)

## 2023-06-01 LAB — BASIC METABOLIC PANEL
Anion gap: 17 — ABNORMAL HIGH (ref 5–15)
BUN: 159 mg/dL — ABNORMAL HIGH (ref 8–23)
CO2: 21 mmol/L — ABNORMAL LOW (ref 22–32)
Calcium: 8 mg/dL — ABNORMAL LOW (ref 8.9–10.3)
Chloride: 106 mmol/L (ref 98–111)
Creatinine, Ser: 3.39 mg/dL — ABNORMAL HIGH (ref 0.44–1.00)
GFR, Estimated: 13 mL/min — ABNORMAL LOW (ref >60)
Glucose, Bld: 159 mg/dL — ABNORMAL HIGH (ref 70–99)
Potassium: 4.3 mmol/L (ref 3.5–5.1)
Sodium: 144 mmol/L (ref 135–145)

## 2023-06-01 LAB — POCT I-STAT EG7
Acid-base deficit: 5 mmol/L — ABNORMAL HIGH (ref 0.0–2.0)
Bicarbonate: 23.7 mmol/L (ref 20.0–28.0)
Calcium, Ion: 1.15 mmol/L (ref 1.15–1.40)
HCT: 29 % — ABNORMAL LOW (ref 36.0–46.0)
Hemoglobin: 9.9 g/dL — ABNORMAL LOW (ref 12.0–15.0)
O2 Saturation: 76 %
Patient temperature: 37.3
Potassium: 4.6 mmol/L (ref 3.5–5.1)
Sodium: 143 mmol/L (ref 135–145)
TCO2: 26 mmol/L (ref 22–32)
pCO2, Ven: 63.3 mmHg — ABNORMAL HIGH (ref 44–60)
pH, Ven: 7.183 — CL (ref 7.25–7.43)
pO2, Ven: 53 mmHg — ABNORMAL HIGH (ref 32–45)

## 2023-06-01 LAB — CULTURE, RESPIRATORY W GRAM STAIN

## 2023-06-01 LAB — RESPIRATORY PANEL BY PCR

## 2023-06-01 LAB — GLUCOSE, CAPILLARY
Glucose-Capillary: 115 mg/dL — ABNORMAL HIGH (ref 70–99)
Glucose-Capillary: 139 mg/dL — ABNORMAL HIGH (ref 70–99)
Glucose-Capillary: 197 mg/dL — ABNORMAL HIGH (ref 70–99)
Glucose-Capillary: 198 mg/dL — ABNORMAL HIGH (ref 70–99)
Glucose-Capillary: 221 mg/dL — ABNORMAL HIGH (ref 70–99)
Glucose-Capillary: 223 mg/dL — ABNORMAL HIGH (ref 70–99)

## 2023-06-01 LAB — CBC
HCT: 24.8 % — ABNORMAL LOW (ref 36.0–46.0)
Hemoglobin: 8.2 g/dL — ABNORMAL LOW (ref 12.0–15.0)
MCH: 28.7 pg (ref 26.0–34.0)
MCHC: 33.1 g/dL (ref 30.0–36.0)
MCV: 86.7 fL (ref 80.0–100.0)
Platelets: 102 10*3/uL — ABNORMAL LOW (ref 150–400)
RBC: 2.86 MIL/uL — ABNORMAL LOW (ref 3.87–5.11)
RDW: 21.1 % — ABNORMAL HIGH (ref 11.5–15.5)
WBC: 23.8 10*3/uL — ABNORMAL HIGH (ref 4.0–10.5)
nRBC: 0.5 % — ABNORMAL HIGH (ref 0.0–0.2)

## 2023-06-01 LAB — PROCALCITONIN: Procalcitonin: 1.57 ng/mL

## 2023-06-01 LAB — C-REACTIVE PROTEIN: CRP: 7.7 mg/dL — ABNORMAL HIGH (ref <1.0)

## 2023-06-01 LAB — SEDIMENTATION RATE: Sed Rate: 10 mm/h (ref 0–22)

## 2023-06-01 LAB — FERRITIN: Ferritin: 112 ng/mL (ref 11–307)

## 2023-06-01 LAB — HEPARIN INDUCED PLATELET AB (HIT ANTIBODY): Heparin Induced Plt Ab: 0.095 {OD_unit} (ref 0.000–0.400)

## 2023-06-01 LAB — MAGNESIUM: Magnesium: 2.1 mg/dL (ref 1.7–2.4)

## 2023-06-01 LAB — TRIGLYCERIDES: Triglycerides: 36 mg/dL (ref <150)

## 2023-06-01 LAB — CK: Total CK: 45 U/L (ref 38–234)

## 2023-06-01 LAB — APTT: aPTT: 77 s — ABNORMAL HIGH (ref 24–36)

## 2023-06-01 SURGERY — RIGHT HEART CATH
Anesthesia: LOCAL

## 2023-06-01 MED ORDER — SODIUM CHLORIDE 0.9 % IV SOLN
150.0000 mg | INTRAVENOUS | Status: DC
  Administered 2023-06-01 – 2023-06-03 (×3): 150 mg via INTRAVENOUS
  Filled 2023-06-01 (×3): qty 7.5

## 2023-06-01 MED ORDER — HYDROCORTISONE SOD SUC (PF) 100 MG IJ SOLR
100.0000 mg | Freq: Three times a day (TID) | INTRAMUSCULAR | Status: DC
  Administered 2023-06-01 – 2023-06-04 (×10): 100 mg via INTRAVENOUS
  Filled 2023-06-01 (×10): qty 2

## 2023-06-01 MED ORDER — MIDODRINE HCL 5 MG PO TABS
10.0000 mg | ORAL_TABLET | Freq: Three times a day (TID) | ORAL | Status: DC
  Administered 2023-06-01 – 2023-06-02 (×2): 10 mg
  Filled 2023-06-01 (×2): qty 2

## 2023-06-01 MED ORDER — PRISMASOL BGK 4/2.5 32-4-2.5 MEQ/L EC SOLN: Status: DC

## 2023-06-01 MED ORDER — AMIODARONE HCL 200 MG PO TABS
200.0000 mg | ORAL_TABLET | Freq: Every day | ORAL | Status: DC
  Administered 2023-06-01 – 2023-06-04 (×4): 200 mg
  Filled 2023-06-01 (×3): qty 1

## 2023-06-01 MED ORDER — SODIUM CHLORIDE 0.9 % IV SOLN
INTRAVENOUS | Status: DC
Start: 2023-06-02 — End: 2023-06-01

## 2023-06-01 MED ORDER — SODIUM CHLORIDE 0.9 % IV SOLN: INTRAVENOUS | Status: DC

## 2023-06-01 MED ORDER — HEPARIN (PORCINE) IN NACL 1000-0.9 UT/500ML-% IV SOLN
INTRAVENOUS | Status: DC | PRN
  Administered 2023-06-01: 500 mL via INTRAVENOUS

## 2023-06-01 MED ORDER — LIDOCAINE HCL (PF) 1 % IJ SOLN
INTRAMUSCULAR | Status: AC
Start: 2023-06-01 — End: ?
  Filled 2023-06-01: qty 30

## 2023-06-01 MED ORDER — ASPIRIN 81 MG PO CHEW: 81.0000 mg | CHEWABLE_TABLET | ORAL | Status: DC

## 2023-06-01 MED ORDER — PIPERACILLIN-TAZOBACTAM 3.375 G IVPB 30 MIN
3.3750 g | Freq: Four times a day (QID) | INTRAVENOUS | Status: DC
  Administered 2023-06-01 – 2023-06-04 (×13): 3.375 g via INTRAVENOUS
  Filled 2023-06-01 (×11): qty 50

## 2023-06-01 SURGICAL SUPPLY — 8 items
CATH BALLN WEDGE 5F 110CM (CATHETERS) IMPLANT
PACK CARDIAC CATHETERIZATION (CUSTOM PROCEDURE TRAY) ×1 IMPLANT
SHEATH GLIDE SLENDER 4/5FR (SHEATH) IMPLANT
SHEATH PROBE COVER 6X72 (BAG) IMPLANT
TRANSDUCER W/STOPCOCK (MISCELLANEOUS) IMPLANT
TUBING ART PRESS 72 MALE/FEM (TUBING) IMPLANT
WIRE EMERALD 3MM-J .025X260CM (WIRE) IMPLANT
WIRE MICRO SET SILHO 5FR 7 (SHEATH) IMPLANT

## 2023-06-01 NOTE — Plan of Care (Signed)
   Problem: Fluid Volume: Goal: Ability to maintain a balanced intake and output will improve Outcome: Progressing   Problem: Health Behavior/Discharge Planning: Goal: Ability to identify and utilize available resources and services will improve Outcome: Progressing Goal: Ability to manage health-related needs will improve Outcome: Progressing

## 2023-06-01 NOTE — Interval H&P Note (Signed)
 History and Physical Interval Note:  06/01/2023 8:16 AM  [name]  has presented today for surgery, with the diagnosis of chf.  The various methods of treatment have been discussed with the patient and family. After consideration of risks, benefits and other options for treatment, the patient has consented to  Procedure(s): RIGHT HEART CATH (N/A) as a surgical intervention.  The patient's history has been reviewed, patient examined, no change in status, stable for surgery.  I have reviewed the patient's chart and labs.  Questions were answered to the patient's satisfaction.     Haig Gerardo [employer]

## 2023-06-01 NOTE — Progress Notes (Signed)
 NAME:  [name], MRN:  [id number], DOB:  [birth year], LOS: 11 ADMISSION DATE:  05/20/2023, CONSULTATION DATE:  05/20/2023 REFERRING MD:  Alinda Money - [facility] CHIEF COMPLAINT:  N/V/Abd pain / Resp failure   History of Present Illness:  78 year old female with medical history significant of hypertension, hyperlipidemia, diabetes, neuropathy, hypothyroidism, CKD 3B, CAD, GERD, CVA, chronic systolic CHF, depression, anxiety, ventricular tachycardia, ventricular fibrillation, atrial fibrillation, status post ICD, obesity, seizure disorder, low back pain, spinal stenosis presenting after feeling unwell for several days and recent ICD discharges.   Patient reports that she has been unwell for possible days.  She is experience nausea vomiting and abdominal pain.  She reports that her defibrillator fired multiple times today.  ED provider confirmed this with the [facility] representative who noted that the device did fire 3 times a day for ventricular fibrillation.  Also noted periods of slow ventricular tachycardia as well as atrial fibrillation.   Patient denies fevers, chills.   ED Course: Vital signs in the ED notable for blood pressure initially in the 70 systolic now improved to the 120s.  Respiratory rate in the 20s.  Requiring 3 L to maintain saturations.  Lab workup included CMP with potassium 3.2, bicarb 18, gap 17, BUN 32, creatinine elevated 2.37 baseline of 2, glucose 2 1, calcium 7.9, protein 6.0, albumin 2.5, T. bili 1.9, AST 1076, ALT 663.  CBC with platelets 142.  BNP elevated to 2548.  Troponin trend 339, 417.  Lipase normal.  Respiratory panel positive for flu A.  Lactic acid pending.  Urinalysis pending.  Blood cultures pending.  VBG with normal pH pCO2 40.7.  Magnesium pending.  Chest x-ray with new bilateral opacities consistent with pneumonia.  CT of the abdomen pelvis showing no acute normality in the abdomen pelvis, bilateral opacity at the lung bases, left renal cysts.  Patient received  ceftriaxone, doxycycline, Tamiflu in the ED.  Started on amiodarone infusion.  Also received morphine, 1 L IV fluids and 40 mEq p.o. potassium x 2.  Cardiology consulted and recommended amiodarone fusion, repletion of magnesium and potassium as needed, adding back beta-blocker as blood pressure allows.  Pertinent Medical History:   has a past medical history of Acute kidney injury superimposed on chronic kidney disease (HCC) (10/02/2022), Allergy, Arthritis, Atrial fibrillation (HCC), Chronic renal insufficiency, Chronic systolic CHF (congestive heart failure) (HCC) (03/20/2013), Chronic systolic heart failure (HCC), Coronary artery disease (01/31/2011), Diabetes mellitus, 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, History of chicken pox, Hyperlipidemia, Hypertension, Ischemic cardiomyopathy, Knee MCL sprain (03/28/2014), Left knee pain (08/30/2012), Lumbar spondylosis (01/11/2012), Pes anserine bursitis (02/23/2014), Sleep apnea, Stroke (HCC) (2010), Suicide attempt by benzodiazepine overdose (HCC) (01/21/2020), UTI (urinary tract infection) (08/03/2013), and Ventricular tachycardia (HCC).   Significant Hospital Events: Including procedures, antibiotic start and stop dates in addition to other pertinent events   2/27 Presented to ED feeling unwell, s/p AICD discharge x 3 for VT/slow VT. Hypotensive with increased WOB prompting intubation. Flu A+,  CXR c/w PNA. 2/28 Remains on high-dose vasopressor support with Levophed. No more episodes of V. Tach. Remain acidotic mixed metabolic and respiratory. 3/1 Remains on ventilator with FiO2 50 PEEP 8, remains on both levo and vaso 3/3 extubated to bipap 3/4 treated w/ lasix still on NIPPV 3/5 inc'd WOB. CXR still w/ marked bilateral airspace disease. ABG w/ sig hypoxia and resp alk. PEEP inc'd on NIPPV. Increased lasix. Widened ABX coverage due to clinical decline  3/6 attempting short rotations of HHFNC but still w/  marked WOB.  Goals of care discussion  with family they continued to request aggressive care.  Shared with them my concern about limiting factor would be renal function, given underlying cardiomyopathy she would be a poor candidate for dialysis 3/7 renal function a little worse CVP 6, using albumin and Lasix to try to mobilize total body overload still BiPAP dependent 3/9 reintubated, pan-CT 3/10 AHF consult 3/11 RHC, renal consult  Interim History / Subjective:  Intubated/sedated Back from RHC  Objective:  Blood pressure (!) 105/44, pulse 60, temperature (!) 97.3 F (36.3 C), resp. rate (!) 36, height 5\' 2"  (1.575 m), weight 89.2 kg, SpO2 93%. CVP:  [2 mmHg-38 mmHg] 7 mmHg  Vent Mode: PCV FiO2 (%):  [60 %-70 %] 60 % Set Rate:  [30 bmp] 30 bmp PEEP:  [10 cmH20-12 cmH20] 10 cmH20   Intake/Output Summary (Last 24 hours) at 06/01/2023 1052 Last data filed at 06/01/2023 0700 Gross per 24 hour  Intake 2415.23 ml  Output 733 ml  Net 1682.23 ml   Filed Weights   05/28/23 0500 05/29/23 0500 05/30/23 0600  Weight: 93 kg 92.7 kg 89.2 kg   Physical Examination: Intubated, sedated Small thick secretions RASS -4 Abd soft Rhonci bilaterally  Remains acidemic on vent despite aggressive pressure control parameters and minute ventilation of 12-13LPM Still unable to get fluid off with diuresis Plts recovered  Resolved Hospital Problem List:  Septic shock Shock liver, improved  Assessment & Plan:  Acute respiratory failure with hypoxia and hypercapnia- odd case; flu ARDS was 10 days ago; lingering issues thought to be some combination fluid overload and HCAP but both have been treated.  Failed extubation trial and now workup shows (A) normal ESR so doubtful COP/Amio lung (B) normal Pct and (C) persistent upper lobe GGO/crazing paving, (D) elevated CVP but okay coox.  RHC today c/w septic state with high CO.   AKI on CKD4- stubborn to diuresis Worsening thrombocytopenia- duplex neg; bival start 3/9 and HIT sent but still pretty  low suspicion; now improved DM2 with hyperglycemia- improved HFrEF, Afib/flutter on xarelto, hx of VT w/ ICD in place- recent ablation Jan 2025; to restart amio given normal ESR Muscular deconditioning Multiple pressure ulcers- developed over past week  Taken together, we are dealing with persistent vasoplegic state, ARDS type pattern CXR with negative inflammatory markers, minimally elevated wedge, and negative infectious workup to date.  We also have high dead space unable to get pH better than around 7.2 despite aggressive vent measures.  Running out of options here so with throw everything at this: - Antimicrobials and antifungals ordered, f/u BAL data - Restart stress steroids - Start CRRT and try to pull -50/hr or so, may need pure bicarb bath - Continue aggressive ventilation strategy to keep pH > 7.1  If above fails we are unfortunately at end of life, I have told family this.  Best Practice (right click and "Reselect all SmartList Selections" daily)   Diet/type: TF DVT prophylaxis bival Pressure ulcer(s): N/A GI prophylaxis: PPI Lines: right internal jugular CVL - 05/20/23 Foley: Yes needed Code Status:  full code Last date of multidisciplinary goals of care discussion: 3/8, continue aggressive care  Critical care time: 33 min

## 2023-06-01 NOTE — Progress Notes (Signed)
 Pt transported on vent to Cath lab and back to 2H11 w/o any complications.

## 2023-06-01 NOTE — Telephone Encounter (Addendum)
 Called to confirm/remind patient of their appointment at the [facility] on 06/02/23.However, was unable to leave patient a voice message because pt's phone number was invalid.   In addition, pt is currently admitted into the hospital.

## 2023-06-01 NOTE — Telephone Encounter (Signed)
 Still havent heard back from pt. Her ph number is not in service

## 2023-06-01 NOTE — Procedures (Signed)
 Central Venous Catheter Insertion Procedure Note  [name]  [id number]  Sep 01, 1945  Date:06/01/23  Time:12:19 PM   Provider Performing:Signora Zucco Bea Laura  Cherlynn Polo   Procedure: Insertion of Non-tunneled Central Venous Catheter(36556)with US guidance (08657)    Indication(s) Hemodialysis  Consent Risks of the procedure as well as the alternatives and risks of each were explained to the patient and/or caregiver.  Consent for the procedure was obtained and is signed in the bedside chart  Anesthesia Topical only with 1% lidocaine   Timeout Verified patient identification, verified procedure, site/side was marked, verified correct patient position, special equipment/implants available, medications/allergies/relevant history reviewed, required imaging and test results available.  Sterile Technique Maximal sterile technique including full sterile barrier drape, hand hygiene, sterile gown, sterile gloves, mask, hair covering, sterile ultrasound probe cover (if used).  Procedure Description Area of catheter insertion was cleaned with chlorhexidine and draped in sterile fashion.   With real-time ultrasound guidance a HD catheter was placed into the left femoral vein.  Nonpulsatile blood flow and easy flushing noted in all ports.  The catheter was sutured in place and sterile dressing applied.  Complications/Tolerance None; patient tolerated the procedure well. Chest X-ray is ordered to verify placement for internal jugular or subclavian cannulation.  Chest x-ray is not ordered for femoral cannulation.  EBL Minimal  Specimen(s) None

## 2023-06-01 NOTE — Procedures (Signed)
 Consent obtained for HD catheter Left subclavicular and neck area cleaned and draped Lidocaine used to anesthesize Obtained access to subclavian vein and internal jugular vein; however, unable to pass wire beyond 2 cm into vessel: suspect related to old ICD.  After multiple attempts to wire through using 18g guide catheter and ultrasound, still unable to traverse whatever stenosis is present.  Therefore further attempts in this space abandoned and PA will do femoral. No immediate complications, will get CXR  Myrla Halsted MD PCCM

## 2023-06-01 NOTE — Consult Note (Addendum)
  [facility] Nephrology Consultation Note  Requesting MD: Dr. [name] Reason for consult: CRRT  HPI:  [name] is a 78 y.o. female. PMH of HTN, HLD, afib on xarelto, HFrEF, CAD, DM, hx of vtach and vfib s/p ICD, CKD 3B.  Presented to ED on 2/27 for feeling unwell for several days and ICD discharges found to be Flu A positive. Noted ICD discharges for VT. She was intubated due to decline in ED with hypotension and worsening SOB and admitted to ICU for septic shock and acute on chronic HFrEF.   Nephrology consulted for minimal response with lasix drip in setting of RV failure along with worsening BUN and AKI on CKD3B. Has has decreased UOP while on lasix drip.   She is currently intubated and sedated on propofol and fentanyl. Returned from [facility] with noted high cardiac output. Granddaughter at bedside.    PMHx:  Past Medical History:  Diagnosis Date   Acute kidney injury superimposed on chronic kidney disease (HCC) 10/02/2022   Allergy    Arthritis    Atrial fibrillation (HCC)    controlled with amiodarone, on coumadin   Chronic renal insufficiency    Chronic systolic CHF (congestive heart failure) (HCC) 03/20/2013   Chronic systolic heart failure (HCC)    Coronary artery disease 01/31/2011   Diabetes mellitus    type 1   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    History of chicken pox    Hyperlipidemia    Hypertension    Ischemic cardiomyopathy    Knee MCL sprain 03/28/2014   Left knee pain 08/30/2012   Lumbar spondylosis 01/11/2012   Pes anserine bursitis 02/23/2014   Sleep apnea    Stroke [facility]) 2010   eye doctor said she had TIA   Suicide attempt by benzodiazepine overdose (HCC) 01/21/2020   UTI (urinary tract infection) 08/03/2013   Ventricular tachycardia (HCC)    Polymorphic    Past Surgical History:  Procedure Laterality Date   ABDOMINAL HYSTERECTOMY  1995   CARDIAC DEFIBRILLATOR PLACEMENT     CHOLECYSTECTOMY  1985    COLONOSCOPY WITH PROPOFOL N/A 02/24/2018   Procedure: COLONOSCOPY WITH PROPOFOL;  Surgeon: Rachael Fee, MD;  Location: [facility] ENDOSCOPY;  Service: Endoscopy;  Laterality: N/A;   ICD  2003/2007   implanted by Dr Alanda Amass, most recent generator change 2/13 by Dr Johney Frame, Analyze ST study patient   ICD GENERATOR CHANGEOUT N/A 04/11/2020   Procedure: ICD GENERATOR CHANGEOUT;  Surgeon: Hillis Range, MD;  Location: [facility] INVASIVE CV LAB;  Service: Cardiovascular;  Laterality: N/A;   IMPLANTABLE CARDIOVERTER DEFIBRILLATOR (ICD) GENERATOR CHANGE N/A 05/05/2011   Procedure: ICD GENERATOR CHANGE;  Surgeon: Hillis Range, MD;  Location: [facility] CATH LAB;  Service: Cardiovascular;  Laterality: N/A;   LEFT HEART CATH AND CORONARY ANGIOGRAPHY N/A 10/14/2022   Procedure: LEFT HEART CATH AND CORONARY ANGIOGRAPHY;  Surgeon: Kathleene Hazel, MD;  Location: [facility] INVASIVE CV LAB;  Service: Cardiovascular;  Laterality: N/A;   LEFT HEART CATHETERIZATION WITH CORONARY ANGIOGRAM N/A 01/30/2011   Procedure: LEFT HEART CATHETERIZATION WITH CORONARY ANGIOGRAM;  Surgeon: Laurey Morale, MD;  Location: [facility] CATH LAB;  Service: Cardiovascular;  Laterality: N/A;   POLYPECTOMY  02/24/2018   Procedure: POLYPECTOMY;  Surgeon: Rachael Fee, MD;  Location: [facility] ENDOSCOPY;  Service: Endoscopy;;   Ma Rings  1980   V TACH ABLATION N/A 04/02/2023   Procedure: Floyce Stakes ABLATION;  Surgeon: Regan Lemming, MD;  Location: [facility] INVASIVE CV LAB;  Service: Cardiovascular;  Laterality: N/A;    Family Hx:  Family History  Problem Relation Age of Onset   Diabetes Mother    Heart disease Mother    Hyperlipidemia Mother    Hypertension Mother    Heart disease Father    Heart attack Father    Hypertension Father    Breast cancer Sister    Lung cancer Sister    Irritable bowel syndrome Sister    Early death Brother 67   Colon cancer Maternal Aunt    Seizures Granddaughter    Esophageal cancer Neg Hx    Colon polyps Neg Hx      Social History:  reports that she quit smoking about 27 years ago. Her smoking use included cigarettes. She has never been exposed to tobacco smoke. She has never used smokeless tobacco. She reports that she does not drink alcohol and does not use drugs.  Allergies:  Allergies  Allergen Reactions   Veltassa [Patiromer] Diarrhea   Aldactone [Spironolactone] Diarrhea and Nausea And Vomiting   Darvon [Propoxyphene] Other (See Comments)    Indigestion    Mexitil [Mexiletine] Nausea And Vomiting and Other (See Comments)    Tremors Difficulty walking Blurred vision   Phenergan [Promethazine Hcl] Other (See Comments)    Hyperactivity    Ranexa [Ranolazine Er] Diarrhea   Plavix [Clopidogrel Bisulfate] Rash    Medications: Prior to Admission medications   Medication Sig Start Date End Date Taking? Authorizing Provider  amiodarone (PACERONE) 200 MG tablet Take 1 tablet (200 mg total) by mouth daily. 11/15/22  Yes Jeanella Craze, NP  aspirin EC 81 MG tablet Take 1 tablet (81 mg total) by mouth daily. Swallow whole. 10/17/22  Yes Ollis, Brandi L, NP  busPIRone (BUSPAR) 5 MG tablet Take 1 tablet (5 mg total) by mouth 2 (two) times daily. 05/20/21  Yes Milford, Anderson Malta, FNP  carvedilol (COREG) 25 MG tablet TAKE 1 TABLET (25 MG TOTAL) BY MOUTH TWICE A DAY WITH MEALS 04/15/23  Yes Camnitz, Andree Coss, MD  Cholecalciferol (VITAMIN D-3 PO) Take 1 tablet by mouth daily.   Yes [provider]  dicyclomine (BENTYL) 10 MG capsule TAKE 1 CAPSULE (10 MG TOTAL) BY MOUTH 4 (FOUR) TIMES DAILY BEFORE MEALS AND AT BEDTIME. 02/09/20  Yes Corwin Levins, MD  furosemide (LASIX) 20 MG tablet Take 2 tablets (40 mg) by mouth once a day. 03/12/23  Yes Laurey Morale, MD  gabapentin (NEURONTIN) 100 MG capsule Take 200 mg by mouth 2 (two) times daily.   Yes [provider]  insulin aspart (NOVOLOG FLEXPEN) 100 UNIT/ML FlexPen Inject up to 15 units daily under skin as advised Patient taking  differently: Inject 4-5 Units into the skin 3 (three) times daily before meals. Inject up to 15 units daily under skin as advised 06/24/22  Yes Carlus Pavlov, MD  insulin degludec (TRESIBA FLEXTOUCH) 100 UNIT/ML FlexTouch Pen Inject 12-14 Units into the skin daily. Patient taking differently: Inject 14 Units into the skin at bedtime. 01/21/23  Yes Carlus Pavlov, MD  levETIRAcetam (KEPPRA XR) 500 MG 24 hr tablet Take 1 tablet (500 mg total) by mouth at bedtime. 12/22/22 12/17/23 Yes Camara, Amalia Hailey, MD  losartan (COZAAR) 25 MG tablet Take 25 mg by mouth daily.   Yes [provider]  Multiple Vitamins-Minerals (EYE VITAMINS PO) Take 1 tablet by mouth 2 (two) times daily.   Yes [provider]  nitroGLYCERIN (NITROSTAT) 0.4 MG SL tablet Place 1 tablet (0.4 mg total) under the tongue  every 5 (five) minutes as needed for chest pain. 12/09/22  Yes Sheilah Pigeon, PA-C  omeprazole (PRILOSEC) 20 MG capsule Take 1 capsule by mouth daily. 10/15/22  Yes [provider]  Rivaroxaban (XARELTO) 15 MG TABS tablet Take 1 tablet (15 mg total) by mouth daily with supper. Patient taking differently: Take 15 mg by mouth at bedtime. 04/02/23  Yes Ollis, Brandi L, NP  rosuvastatin (CRESTOR) 40 MG tablet Take 1 tablet (40 mg total) by mouth daily. NEEDS FOLLOW UP APPOINTMENT FOR MORE REFILLS Patient taking differently: Take 40 mg by mouth at bedtime. 09/17/22  Yes Laurey Morale, MD  sertraline (ZOLOFT) 50 MG tablet Take 50 mg by mouth at bedtime. 09/17/22  Yes [provider]  Continuous Glucose Sensor (FREESTYLE LIBRE 3 SENSOR) MISC 1 each by Does not apply route every 14 (fourteen) days. 11/19/22   Carlus Pavlov, MD  magnesium oxide (MAG-OX) 400 (240 Mg) MG tablet TAKE 1 TABLET BY MOUTH TWICE A DAY Patient not taking: Reported on 05/20/2023 12/19/21   Corwin Levins, MD    I have reviewed the patient's current medications.  Labs: Renal Panel: Recent Labs  Lab 05/28/23 0422  05/28/23 1109 05/29/23 0416 05/29/23 1735 05/30/23 0316 05/30/23 0330 05/30/23 1859 05/31/23 0427 05/31/23 0634 05/31/23 1719 05/31/23 1819 05/31/23 1850 06/01/23 0413 06/01/23 0422 06/01/23 0831  NA 147*   < > 145   < > 144   < > 142 140   < > 142 143 142 144 141 142  K 3.6   < > 4.1   < > 3.8   < > 5.2* 4.4   < > 4.6 4.6 4.6 4.3 4.1 4.2  CL 112*   < > 112*   < > 109  --  106 110  --  106  --   --  106  --   --   CO2 20*   < > 20*   < > 19*  --  20* 19*  --  23  --   --  21*  --   --   GLUCOSE 313*   < > 228*   < > 94  --  238* 238*  --  181*  --   --  159*  --   --   BUN 93*   < > 108*   < > 120*  --  134* 136*  --  146*  --   --  159*  --   --   CREATININE 2.39*   < > 2.52*   < > 2.60*  --  2.92* 3.02*  --  3.44*  --   --  3.39*  --   --   CALCIUM 8.2*   < > 8.4*   < > 8.7*  --  8.5* 7.9*  --  8.0*  --   --  8.0*  --   --   MG 2.4  --  2.1  --  2.0  --   --  2.0  --   --   --   --  2.1  --   --   PHOS 4.5  --  4.2  --  5.7*  --   --  7.6*  --   --   --   --  8.1*  --   --    < > = values in this interval not displayed.     CBC:    Latest Ref Rng & Units 06/01/2023  8:31 AM 06/01/2023    4:22 AM 06/01/2023    4:13 AM  CBC  WBC 4.0 - 10.5 K/uL   23.8   Hemoglobin 12.0 - 15.0 g/dL 9.5  65.7  8.2   Hematocrit 36.0 - 46.0 % 28.0  36.0  24.8   Platelets 150 - 400 K/uL   102      Anemia Panel:  Recent Labs    05/31/23 0427 05/31/23 0634 05/31/23 1819 05/31/23 1850 06/01/23 0413 06/01/23 0422 06/01/23 0831  HGB 8.5*   < > 9.9* 9.9* 8.2* 12.2 9.5*  MCV 84.9  --   --   --  86.7  --   --    < > = values in this interval not displayed.    Recent Labs  Lab 05/28/23 0422  AST 18  ALT 86*  ALKPHOS 84  BILITOT 1.1  PROT 5.3*  ALBUMIN 1.8*    Lab Results  Component Value Date   HGBA1C 7.7 (H) 05/20/2023    ROS: unable to obtain due to intubated and sedated  Physical Exam: Vitals:   06/01/23 1000 06/01/23 1015  BP: (!) 101/44 (!) 105/44  Pulse: 61 60   Resp: 19 (!) 22  Temp: (!) 97.5 F (36.4 C) (!) 97.3 F (36.3 C)  SpO2: 94% 93%     General exam: sedated/intubated Respiratory system: mechanically vented  Cardiovascular system: RRR Gastrointestinal system: soft, non-distended  Central nervous system: sedated  Extremities: no pitting edema Skin: RIJ CVC   Assessment/Plan:  # AKI on CKD3B # Acute on chronic HFrEF Minimal response with lasix drip. RHC showed high cardiac output. BUN rising to 159 and Scr remains elevated. Poor UOP. CVP 12-13.  - plan to initiate CRRT today  - cautious with labile MAPs while on levophed  - may need tunneled dialysis cath placed, discuss with PCCM - stop IV lasix drip  - appears cardiology has spoken with patient's son, updated granddaughter at bedside   # Septic shock in setting of Flu A/CAP: on levophed still, per primary  # Acute hypoxic respiratory failure: intubated and vented, per primary  # Thrombocytopenia: monitor CBC, on Bilvalirudin  # PAF: sinus rhythm, on amio  # GOC: palliative medicine following    Rana Snare 06/01/2023, 10:20 AM

## 2023-06-01 NOTE — Progress Notes (Signed)
 eLink Physician-Brief Progress Note Patient Name: [name] DOB: 09/16/1945 MRN: [id number]   Date of Service  06/01/2023  HPI/Events of Note  Notified of critical ABG. 60%/ 10 PEEP/ 30 RR. on PCV, pressure control 15   eICU Interventions  Relatively stable, no acute intervention indicated.  Minimal room on the vent make a change.     Intervention Category Intermediate Interventions: Respiratory distress - evaluation and management  Giang Hemme 06/01/2023, 5:03 AM

## 2023-06-01 NOTE — Progress Notes (Signed)
 PT Cancellation Note  Patient Details Name: [name] MRN: [id number] DOB: 09-Oct-1945   Cancelled Treatment:    Reason Eval/Treat Not Completed: Patient not medically ready. Pt just returned from cath lab and is on bed rest with plans to start CRRT today. Acute PT to hold today. PT to return as able, as appropriate to progress mobility.  Lewis Shock, PT, DPT Acute Rehabilitation Services Secure chat preferred Office #: [phone]    [name] 06/01/2023, 9:02 AM

## 2023-06-01 NOTE — Progress Notes (Signed)
 Palliative Medicine Progress Note   Patient Name: [name]       Date: 06/01/2023 DOB: 11-18-1945  Age: 78 y.o. MRN#: [id number] Attending Physician: Lorin Glass, MD Primary Care Physician: Aviva Kluver Admit Date: 05/20/2023   HPI/Patient Profile: 78 y.o. female  with past medical history of chronic HFrEF, status post ICD, diabetes, neuropathy, CAD, CKD stage IIIb, atrial fibrillation, seizure disorder, ventricular fibrillation, hypertension, and hyperlipidemia.  She presented to the ED on 05/20/2023 feeling unwell for several days and reporting her ICD fired.  She has found to have influenza A and pneumonia. She was intubated in the ED due to increased work of breathing prompting intubation. She was in septic shock requiring vasopressor support.    Significant Hospital Events: 3/3 - extubated to BiPAP  3/5 - increased WOB, CXR still with marked bilateral airspace disease. Widened antibiotic coverage 3/6 - did not tolerate trial of HHFNC 3/7 - worsening renal function, still BiPAP dependent 3/8 - palliative consult, GOC with family requesting to continue full scope care 3/9 - reintubated 3/11 - RHC, started CRRT    Palliative Medicine was consulted for goals of care.    Subjective: Chart reviewed including labs, vital signs, imaging, and progress notes. Update received from RN. Patient is intubated, on CRRT, and vasopressor support.   I spoke with daughter/Lisa by phone. Discussed clinical events that occurred since we met on 3/7 (reintubation, CRRT). Reviewed the seriousness of patient's current medical situation, that she is critically ill and in multi-organ failure. Discussed that patient is not improving despite escalating aggressive measures.   Questions answered. Discussed  plan for ongoing palliative support.    Objective:  Physical Exam Vitals reviewed.  Constitutional:      General: She is not in acute distress.    Interventions: She is sedated.     Comments: Critically ill-appearing  Cardiovascular:     Rate and Rhythm: Normal rate.  Pulmonary:     Comments: intubated            Vital Signs: BP (!) 118/46   Pulse 60   Temp (!) 97 F (36.1 C)   Resp (!) 30   Ht 5\' 2"  (1.575 m)   Wt 89.2 kg   SpO2 94%   BMI 35.97 kg/m  SpO2: SpO2: 94 % O2 Device: O2  Device: Ventilator   Palliative Medicine Assessment & Plan   Assessment: Principal Problem:   Acute respiratory failure with hypoxia (HCC) Active Problems:   Implantable cardioverter-defibrillator (ICD) in situ   PAF (paroxysmal atrial fibrillation) (HCC)   Coronary artery disease   HTN (hypertension)   Obesity, unspecified   Chronic systolic CHF (congestive heart failure) (HCC)   Lower back pain   Spinal stenosis of lumbar region   Diabetic peripheral neuropathy associated with type 2 diabetes mellitus (HCC)   Hyperlipidemia   Hypothyroidism   DM (diabetes mellitus), type 2 with renal complications (HCC)   VT (ventricular tachycardia) (HCC)   Acute renal failure superimposed on stage 3b chronic kidney disease (HCC)   Seizure disorder (HCC)   Ventricular fibrillation (HCC)   Gastroesophageal reflux disease without esophagitis   History of cerebrovascular accident   Influenza A    Recommendations/Plan: Continue full scope care Goal is medical stabilization - family is hopeful for improvement  Ongoing palliative support   Primary Decision Maker: NEXT OF KIN - son/Brian and daughter/Lisa   Code Status: Full code   Prognosis: Very guarded  Discharge Planning: To Be Determined   Thank you for allowing the Palliative Medicine Team to assist in the care of this patient.   Time: 28 minutes   Merry Proud, NP   Please contact Palliative Medicine Team phone  at [phone] for questions and concerns.  For individual providers, please see AMION.

## 2023-06-01 NOTE — Progress Notes (Addendum)
 Advanced Heart Failure Rounding Note  Cardiologist: [name], MD  Chief Complaint: Heart Failure  Subjective:   3/10- Switched to lasix drip.   Overnight norepi increased to 12 mcg. Sluggish urine output over night.    Remains intubated.   Objective:   Weight Range: 89.2 kg Body mass index is 35.97 kg/m.   Vital Signs:   Temp:  [95.4 F (35.2 C)-99.3 F (37.4 C)] 99 F (37.2 C) (03/11 0700) Pulse Rate:  [54-73] 59 (03/11 0700) Resp:  [11-40] 21 (03/11 0700) BP: (105-149)/(38-58) 123/44 (03/11 0700) SpO2:  [83 %-100 %] 94 % (03/11 0700) Arterial Line BP: (72-149)/(32-59) 110/42 (03/11 0700) FiO2 (%):  [60 %-70 %] 60 % (03/11 0416) Last BM Date : 06/01/23  Weight change: Filed Weights   05/28/23 0500 05/29/23 0500 05/30/23 0600  Weight: 93 kg 92.7 kg 89.2 kg    Intake/Output:   Intake/Output Summary (Last 24 hours) at 06/01/2023 0720 Last data filed at 06/01/2023 0700 Gross per 24 hour  Intake 2817.16 ml  Output 983 ml  Net 1834.16 ml    CVP 12-13   Physical Exam   General:   Intubated  Neck: supple.  JVP difficult to assess Cor: PMI nondisplaced. Regular rate & rhythm. No rubs, gallops or murmurs. Lungs:Crackles L>R Abdomen: soft, nontender, nondistended.  Extremities: no cyanosis, clubbing, rash, edema Neuro: Intubated/sedated  Telemetry   SR  EKG    N/A   Labs    CBC Recent Labs    05/31/23 0427 05/31/23 0634 06/01/23 0413 06/01/23 0422  WBC 18.4*  --  23.8*  --   HGB 8.5*   < > 8.2* 12.2  HCT 25.8*   < > 24.8* 36.0  MCV 84.9  --  86.7  --   PLT 76*  --  102*  --    < > = values in this interval not displayed.   Basic Metabolic Panel Recent Labs    46/96/29 0427 05/31/23 0634 05/31/23 1719 05/31/23 1819 06/01/23 0413 06/01/23 0422  NA 140   < > 142   < > 144 141  K 4.4   < > 4.6   < > 4.3 4.1  CL 110  --  106  --  106  --   CO2 19*  --  23  --  21*  --   GLUCOSE 238*  --  181*  --  159*  --   BUN 136*  --  146*   --  159*  --   CREATININE 3.02*  --  3.44*  --  3.39*  --   CALCIUM 7.9*  --  8.0*  --  8.0*  --   MG 2.0  --   --   --  2.1  --   PHOS 7.6*  --   --   --  8.1*  --    < > = values in this interval not displayed.   Liver Function Tests No results for input(s): "AST", "ALT", "ALKPHOS", "BILITOT", "PROT", "ALBUMIN" in the last 72 hours. No results for input(s): "LIPASE", "AMYLASE" in the last 72 hours. Cardiac Enzymes No results for input(s): "CKTOTAL", "CKMB", "CKMBINDEX", "TROPONINI" in the last 72 hours.  BNP: BNP (last 3 results) Recent Labs    03/12/23 1107 05/20/23 1223 05/31/23 0634  BNP 124.1* 2,548.1* 1,837.9*    ProBNP (last 3 results) No results for input(s): "PROBNP" in the last 8760 hours.   D-Dimer No results for input(s): "DDIMER" in the last  72 hours. Hemoglobin A1C No results for input(s): "HGBA1C" in the last 72 hours. Fasting Lipid Panel Recent Labs    06/01/23 0413  TRIG 36   Thyroid Function Tests No results for input(s): "TSH", "T4TOTAL", "T3FREE", "THYROIDAB" in the last 72 hours.  Invalid input(s): "FREET3"  Other results:   Imaging    DG CHEST PORT 1 VIEW Result Date: 06/01/2023 CLINICAL DATA:  Hypoxia EXAM: PORTABLE CHEST 1 VIEW COMPARISON:  Two days prior FINDINGS: Cardiomegaly with dual-chamber pacer leads from the left. Right IJ line with tip at the SVC. Endotracheal tube with tip at the clavicular heads. The feeding tube at least reaches the stomach Low volume chest with indistinct opacity on both sides, greatest at the retrocardiac lung and right apex. Low lung volumes. There is chronic lung disease with fibrotic features by prior CT. IMPRESSION: Stable hardware positioning and aeration. Electronically Signed   By: Tiburcio Pea M.D.   On: 06/01/2023 06:26     Medications:     Scheduled Medications:  amiodarone  200 mg Oral Daily   aspirin  81 mg Per Tube Daily   atorvastatin  80 mg Per Tube Daily   busPIRone  5 mg Per Tube  BID   Chlorhexidine Gluconate Cloth  6 each Topical Daily   docusate  100 mg Per Tube BID   free water  50 mL Per Tube Q4H   gabapentin  200 mg Per Tube Q12H   insulin aspart  0-20 Units Subcutaneous Q4H   insulin aspart  7 Units Subcutaneous Q4H   insulin glargine  15 Units Subcutaneous BID   ipratropium-albuterol  3 mL Nebulization Q6H   metolazone  10 mg Per Tube BID   nutrition supplement (JUVEN)  1 packet Per Tube BID BM   mouth rinse  15 mL Mouth Rinse Q2H   pantoprazole (PROTONIX) IV  40 mg Intravenous QHS   polyethylene glycol  17 g Per Tube BID   predniSONE  10 mg Per Tube Q breakfast   sennosides  10 mL Per Tube BID   sertraline  50 mg Per Tube QHS   thiamine  100 mg Per Tube Daily    Infusions:  sodium chloride     bivalirudin (ANGIOMAX) 250 mg in sodium chloride 0.9 % 500 mL (0.5 mg/mL) infusion 0.03 mg/kg/hr (06/01/23 0700)   feeding supplement (VITAL AF 1.2 CAL) 60 mL/hr at 06/01/23 0700   fentaNYL infusion INTRAVENOUS 25 mcg/hr (06/01/23 0700)   furosemide (LASIX) 200 mg in dextrose 5 % 100 mL (2 mg/mL) infusion 30 mg/hr (06/01/23 0700)   levETIRAcetam Stopped (05/31/23 2204)   norepinephrine (LEVOPHED) Adult infusion 12 mcg/min (06/01/23 0700)   propofol (DIPRIVAN) infusion 25 mcg/kg/min (06/01/23 0700)    PRN Medications: acetaminophen (TYLENOL) oral liquid 160 mg/5 mL, bisacodyl, fentaNYL, hydrALAZINE, ipratropium-albuterol, midazolam, mouth rinse    Patient Profile    Ms Maiden is a 78 year old with a history of DMII,  CKD stage IV, A fib, VT, [employer] Jude ICD, CAD, HLD, HTN, OSA, CVA, VT ablation 1/25, and HFrEF.    Admitted with VT x3 , Flu A , CAP, and shock.  Assessment/Plan   1. Shock suspect combination/ Septic  CAP/VT/Flu A. . Lactic acid 5.2 on admit. Placed on multiple pressors and antibiotics. . Lactic acid down to 1.  2/27 Bld Cultures -negative.  3/9 Respiratory Cultures -no growth. WBC up 24 Norepi up to 12 mcg.    2. Acute Hypoxic  Respiratory Failure CAP/Flu A. On  antibiotics. Intubated on admit with extubation on 3/3 to Bipap. Continued to struggle and required reintubation on 3/9. Bronchoscopy-results c/w aspiration.   3. Chronic HFrEF 12/2022 Echo EF 35-40% which is consistent with Echo this admit. BNP 1837.  Limited Echo +bubble study.  -CVP 12-13.Sluggish urine output despite high dose lasix drip. -On Norepi 12 mcg    4. Thrombocytopenia -Dropped to 65-->102  -HIT pending. On Bilvalirudin.    5. PAF  -Had A flutter on admit. On amio drip and Bivalirudin.    6. AKI on CKD Stage IIIb -Creatinine up to 3.4 BUN 159  -Sluggish diuresis on high dose diuretics.   7. CAD Cath 16109 patent LAD stent with min stenosis, severe stenosis distal circumflex, and patent proximal mid stents  HS Trop [id number] On aspirin and statin.   8. GOC  Palliative Care following. Remains full code  RHC this am to further assess hemodynamics. CRITICAL CARE Performed by: Tonye Becket NP-C   Total critical care time: 10 minutes  Critical care time was exclusive of separately billable procedures and treating other patients.  Critical care was necessary to treat or prevent imminent or life-threatening deterioration.  Critical care was time spent personally by me on the following activities: development of treatment plan with patient and/or surrogate as well as nursing, discussions with consultants, evaluation of patient's response to treatment, examination of patient, obtaining history from patient or surrogate, ordering and performing treatments and interventions, ordering and review of laboratory studies, ordering and review of radiographic studies, pulse oximetry and re-evaluation of patient's condition.   Length of Stay: 11  Amy Clegg, NP  06/01/2023, 7:20 AM  Advanced Heart Failure Team Pager [phone] (M-F; 7a - 5p)  Please contact [facility] for night-coverage after hours (5p -7a ) and weekends on  [email or web address]  Patient seen with NP, I formulated the plan and agree with the above note.   This morning, she is on Lasix gtt 30 mg/hr and received metolazone yesterday with only 620 cc UOP.  BUN/creatinine 146/3.44 => 159/3.39.    She is on NE 12 with stable MAP.    She is on cefepime.   RHC Procedural Findings (today): Hemodynamics (mmHg) RA mean 14 RV 52/20 PA 51/16, mean 33 PCWP mean 13 Oxygen saturations: PA 68% AO 99% Cardiac Output (Fick) 7.29  Cardiac Index (Fick) 3.85 PVR 2.7 WU SVR 581 PAPi 2.5   General: Intubated/sedated Neck: JVP 12 cm, no thyromegaly or thyroid nodule.  Lungs: Decreased at bases.  CV: Nondisplaced PMI.  Heart regular S1/S2, no S3/S4, no murmur.  1+ ankle edema.    Abdomen: Soft, nontender, no hepatosplenomegaly, no distention.  Skin: Intact without lesions or rashes.  Neurologic: Sedated on vent. Extremities: No clubbing or cyanosis.  HEENT: Normal.   1. Shock: Suspect primarily septic/distributive.  Not on inotropes.  Currently on NE 12.  RHC today with high cardiac output and low SVR at 581.   - Titrate NE as able to maintain MAP.  - Broaden antimicrobial coverage today, discussed with CCM.  2. Acute on chronic systolic CHF:  Ischemic cardiomyopathy.  Echo this admission with EF 40% with low normal RV function, weakly positive bubble study, IVC dilated. This is comparable to the past.  AKI with attempt at diuresis, only 620 cc UOP on aggressive regimen yesterday.  RHC showed primarily RV failure with RA 14, PCWP 13.  Cardiac output high with primarily pulmonary venous hypertension.  She remains on NE 12 to maintain MAP.  - Minimal  response to diuretics and primarily RV failure with worsening BUN => I think at this point we need to consider renal for CVVH.  - She does not require inotropes.  3. Acute hypoxemic respiratory failure: Influenza A and likely secondary bacterial PNA with possible aspiration and now ARDS picture.  Septic shock picture on  RHC.  - On cefepime, but plan to broaden microbial coverage as discussed above.  4. AKI on CKD stage 3: Creatinine baseline around 2.  Now suspect ATN with AKI in setting of shock. BUN/creatinine 146/3.44 => 159/3.39.  - Minimal UOP with aggressive diuretic regimen, plan to consult nephrology for CVVH as above.  5. Atrial fibrillation/flutter: Paroxysmal.  In NSR  - Continue amiodarone po.  - Bivalirudin with ?HIT.  6. Thrombocytopenia: plts 76 => 102.  - Bivalirudin gtt for now, HIT pending.  7. CAD: Last cath in 7/24 with severe stenosis distal LCx managed medically, patent stents. No ACS this admission.  - Continue statin.  8. 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  Does not tolerate mexiletine.  VT ablation 1/25.  Had VT at admission with ATP.  - Continue amiodarone po.   Discussed with CCM and will contact nephrology.   CRITICAL CARE Performed by: [name]  Total critical care time: 50 minutes  Critical care time was exclusive of separately billable procedures and treating other patients.  Critical care was necessary to treat or prevent imminent or life-threatening deterioration.  Critical care was time spent personally by me on the following activities: development of treatment plan with patient and/or surrogate as well as nursing, discussions with consultants, evaluation of patient's response to treatment, examination of patient, obtaining history from patient or surrogate, ordering and performing treatments and interventions, ordering and review of laboratory studies, ordering and review of radiographic studies, pulse oximetry and re-evaluation of patient's condition.  [name] 06/01/2023 9:00 AM

## 2023-06-01 NOTE — Progress Notes (Signed)
 PHARMACY - ANTICOAGULATION CONSULT NOTE  Pharmacy Consult for heparin  > Bivalirudin  Indication: atrial fibrillation  Allergies  Allergen Reactions   Veltassa [Patiromer] Diarrhea   Aldactone [Spironolactone] Diarrhea and Nausea And Vomiting   Darvon [Propoxyphene] Other (See Comments)    Indigestion    Mexitil [Mexiletine] Nausea And Vomiting and Other (See Comments)    Tremors Difficulty walking Blurred vision   Phenergan [Promethazine Hcl] Other (See Comments)    Hyperactivity    Ranexa [Ranolazine Er] Diarrhea   Plavix [Clopidogrel Bisulfate] Rash    Patient Measurements: Height: 5\' 2"  (157.5 cm) Weight: 89.2 kg (196 lb 10.4 oz) IBW/kg (Calculated) : 50.1 HEPARIN DW (KG): 68.6   Vital Signs: Temp: 99 F (37.2 C) (03/11 0700) BP: 123/44 (03/11 0700) Pulse Rate: 59 (03/11 0700)  Labs: Recent Labs    05/29/23 1231 05/29/23 1735 05/30/23 0316 05/30/23 0330 05/30/23 1454 05/30/23 1859 05/31/23 0427 05/31/23 0634 05/31/23 1719 05/31/23 1819 05/31/23 1850 06/01/23 0413 06/01/23 0422  HGB  --   --  8.1*   < >  --   --  8.5*   < >  --    < > 9.9* 8.2* 12.2  HCT  --   --  24.0*   < >  --   --  25.8*   < >  --    < > 29.0* 24.8* 36.0  PLT  --   --  65*  --   --   --  76*  --   --   --   --  102*  --   APTT  --   --   --   --  70*  --  68*  --   --   --   --  77*  --   HEPARINUNFRC 0.19*  --  0.33  --   --   --   --   --   --   --   --   --   --   CREATININE  --    < > 2.60*  --   --    < > 3.02*  --  3.44*  --   --  3.39*  --    < > = values in this interval not displayed.    Estimated Creatinine Clearance: 14.6 mL/min (A) (by C-G formula based on SCr of 3.39 mg/dL (H)).   Medical History: Past Medical History:  Diagnosis Date   Acute kidney injury superimposed on chronic kidney disease (HCC) 10/02/2022   Allergy    Arthritis    Atrial fibrillation (HCC)    controlled with amiodarone, on coumadin   Chronic renal insufficiency    Chronic systolic CHF  (congestive heart failure) (HCC) 03/20/2013   Chronic systolic heart failure (HCC)    Coronary artery disease 01/31/2011   Diabetes mellitus    type 1   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    History of chicken pox    Hyperlipidemia    Hypertension    Ischemic cardiomyopathy    Knee MCL sprain 03/28/2014   Left knee pain 08/30/2012   Lumbar spondylosis 01/11/2012   Pes anserine bursitis 02/23/2014   Sleep apnea    Stroke [facility]) 2010   eye doctor said she had TIA   Suicide attempt by benzodiazepine overdose (HCC) 01/21/2020   UTI (urinary tract infection) 08/03/2013   Ventricular tachycardia (HCC)    Polymorphic    Medications:  Medications Prior to Admission  Medication Sig  Dispense Refill Last Dose/Taking   amiodarone (PACERONE) 200 MG tablet Take 1 tablet (200 mg total) by mouth daily. 30 tablet 6 05/20/2023 Morning   aspirin EC 81 MG tablet Take 1 tablet (81 mg total) by mouth daily. Swallow whole. 30 tablet 12 05/20/2023 Morning   busPIRone (BUSPAR) 5 MG tablet Take 1 tablet (5 mg total) by mouth 2 (two) times daily. 60 tablet 1 05/20/2023 Morning   carvedilol (COREG) 25 MG tablet TAKE 1 TABLET (25 MG TOTAL) BY MOUTH TWICE A DAY WITH MEALS 180 tablet 3 05/20/2023 Morning   Cholecalciferol (VITAMIN D-3 PO) Take 1 tablet by mouth daily.   05/20/2023 Morning   dicyclomine (BENTYL) 10 MG capsule TAKE 1 CAPSULE (10 MG TOTAL) BY MOUTH 4 (FOUR) TIMES DAILY BEFORE MEALS AND AT BEDTIME. 120 capsule 1 05/20/2023 Morning   furosemide (LASIX) 20 MG tablet Take 2 tablets (40 mg) by mouth once a day. 60 tablet 11 05/20/2023 Morning   gabapentin (NEURONTIN) 100 MG capsule Take 200 mg by mouth 2 (two) times daily.   05/20/2023 Morning   insulin aspart (NOVOLOG FLEXPEN) 100 UNIT/ML FlexPen Inject up to 15 units daily under skin as advised (Patient taking differently: Inject 4-5 Units into the skin 3 (three) times daily before meals. Inject up to 15 units daily under skin as advised) 15 mL 11  Past Week   insulin degludec (TRESIBA FLEXTOUCH) 100 UNIT/ML FlexTouch Pen Inject 12-14 Units into the skin daily. (Patient taking differently: Inject 14 Units into the skin at bedtime.) 9 mL 3 05/19/2023 Bedtime   levETIRAcetam (KEPPRA XR) 500 MG 24 hr tablet Take 1 tablet (500 mg total) by mouth at bedtime. 90 tablet 3 05/19/2023 Bedtime   losartan (COZAAR) 25 MG tablet Take 25 mg by mouth daily.   05/20/2023 Morning   Multiple Vitamins-Minerals (EYE VITAMINS PO) Take 1 tablet by mouth 2 (two) times daily.   05/20/2023 Morning   nitroGLYCERIN (NITROSTAT) 0.4 MG SL tablet Place 1 tablet (0.4 mg total) under the tongue every 5 (five) minutes as needed for chest pain. 25 tablet 3 Past Month   omeprazole (PRILOSEC) 20 MG capsule Take 1 capsule by mouth daily.   05/20/2023 Morning   Rivaroxaban (XARELTO) 15 MG TABS tablet Take 1 tablet (15 mg total) by mouth daily with supper. (Patient taking differently: Take 15 mg by mouth at bedtime.) 30 tablet 6 05/19/2023 Bedtime   rosuvastatin (CRESTOR) 40 MG tablet Take 1 tablet (40 mg total) by mouth daily. NEEDS FOLLOW UP APPOINTMENT FOR MORE REFILLS (Patient taking differently: Take 40 mg by mouth at bedtime.) 90 tablet 0 05/19/2023 Bedtime   sertraline (ZOLOFT) 50 MG tablet Take 50 mg by mouth at bedtime.   05/19/2023 Bedtime   Continuous Glucose Sensor (FREESTYLE LIBRE 3 SENSOR) MISC 1 each by Does not apply route every 14 (fourteen) days. 6 each 3    magnesium oxide (MAG-OX) 400 (240 Mg) MG tablet TAKE 1 TABLET BY MOUTH TWICE A DAY (Patient not taking: Reported on 05/20/2023) 180 tablet 1 Not Taking   Scheduled:   amiodarone  200 mg Oral Daily   aspirin  81 mg Per Tube Daily   aspirin  81 mg Oral Pre-Cath   atorvastatin  80 mg Per Tube Daily   busPIRone  5 mg Per Tube BID   Chlorhexidine Gluconate Cloth  6 each Topical Daily   docusate  100 mg Per Tube BID   free water  50 mL Per Tube Q4H   gabapentin  200 mg Per Tube Q12H   insulin aspart  0-20 Units  Subcutaneous Q4H   insulin aspart  7 Units Subcutaneous Q4H   insulin glargine  15 Units Subcutaneous BID   ipratropium-albuterol  3 mL Nebulization Q6H   metolazone  10 mg Per Tube BID   nutrition supplement (JUVEN)  1 packet Per Tube BID BM   mouth rinse  15 mL Mouth Rinse Q2H   pantoprazole (PROTONIX) IV  40 mg Intravenous QHS   polyethylene glycol  17 g Per Tube BID   predniSONE  10 mg Per Tube Q breakfast   sennosides  10 mL Per Tube BID   sertraline  50 mg Per Tube QHS   thiamine  100 mg Per Tube Daily    Assessment: 78 yo female here with VT and ICD shock noted with, VDRF and AKI. 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 Pharmacy consulted to dose heparin.   Heparin level is therapeutic at 0.33 on heparin drip rate 1200 units/hr. Hgb stable 9s plt  trending down to 65 No s/sx of bleeding or infusion issues. Running peripherally and drawn from A-line per RN  Dopplers negative for DVT B/L 3/8. 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   aPTT 77 sec, therapeutic on 0.03 mg/kg/hr.  Hgb 8.2, pltc 102 (improved).   Goal of Therapy:  aPTT 50-85 sec Monitor platelets by anticoagulation protocol: Yes   Plan:  Continue bivalirudin 0.03mg /kg/hr  Daily aPTTs, CBC - check HIT antibody Monitor s/s bleeding   Trixie Rude, PharmD Clinical Pharmacist 06/01/2023  7:35 AM

## 2023-06-01 NOTE — Progress Notes (Signed)
 PHARMACY ANTIBIOTIC CONSULT NOTE   [name] a 78 y.o. female admitted on 05/20/23 with N/V/abdominal pain with ICD shocks .  Pharmacy has been consulted for Zosyn dosing.  MRSA PCR negative - vancomycin discontinued Non-tunneled HD catheter placed 3/11 for CRRT Restarting stress steroids today   06/01/23: Scr 3.39, WBC 23.8 (up from 18), PCT 1.57, ESR 10, CRP 7.7  Estimated Creatinine Clearance: 14.6 mL/min (A) (by C-G formula based on SCr of 3.39 mg/dL (H)).  Plan: START Zosyn 3.375 g IV Q8H (30 min infusion) START micafungin 150 mg IV Q24h Monitor renal function, clinical status, C/S, de-escalation   Allergies:  Allergies  Allergen Reactions   Veltassa [Patiromer] Diarrhea   Aldactone [Spironolactone] Diarrhea and Nausea And Vomiting   Darvon [Propoxyphene] Other (See Comments)    Indigestion    Mexitil [Mexiletine] Nausea And Vomiting and Other (See Comments)    Tremors Difficulty walking Blurred vision   Phenergan [Promethazine Hcl] Other (See Comments)    Hyperactivity    Ranexa [Ranolazine Er] Diarrhea   Plavix [Clopidogrel Bisulfate] Rash    Filed Weights   05/28/23 0500 05/29/23 0500 05/30/23 0600  Weight: 93 kg (205 lb 0.4 oz) 92.7 kg (204 lb 5.9 oz) 89.2 kg (196 lb 10.4 oz)       Latest Ref Rng & Units 06/01/2023    8:31 AM 06/01/2023    4:22 AM 06/01/2023    4:13 AM  CBC  WBC 4.0 - 10.5 K/uL   23.8   Hemoglobin 12.0 - 15.0 g/dL 9.5  16.1  8.2   Hematocrit 36.0 - 46.0 % 28.0  36.0  24.8   Platelets 150 - 400 K/uL   102     Antibiotics Given (last 72 hours)     Date/Time Action Medication Dose Rate   05/30/23 1412 New Bag/Given   ceFEPIme (MAXIPIME) 2 g in sodium chloride 0.9 % 100 mL IVPB 2 g 200 mL/hr   05/31/23 1328 New Bag/Given   ceFEPIme (MAXIPIME) 2 g in sodium chloride 0.9 % 100 mL IVPB 2 g 200 mL/hr   06/01/23 1258 New Bag/Given   piperacillin-tazobactam (ZOSYN) IVPB 3.375 g 3.375 g 100 mL/hr       Antimicrobials this  admission: Rocephin 2/27 > 3/4 Doxycycline 2/27 > 3/5 Cefepime 3/6 > 3/10 Micafungin 3/11 > c Zosyn 3/11 > c  Microbiology results: 2/27 respiratory panel: +Flu A 2/28, 3/7, 3/11 MRSA PCR: negative 2/27 Bcx: negative 3/9 BAL: negative 3/11 Aspergillus Ag: sent  Thank you for allowing pharmacy to be a part of this patient's care.  Trixie Rude, PharmD Clinical Pharmacist 06/01/2023  1:04 PM

## 2023-06-02 ENCOUNTER — Encounter (HOSPITAL_COMMUNITY): Payer: Self-pay | Admitting: Cardiology

## 2023-06-02 ENCOUNTER — Inpatient Hospital Stay (HOSPITAL_COMMUNITY)

## 2023-06-02 ENCOUNTER — Encounter (HOSPITAL_COMMUNITY): Payer: Self-pay

## 2023-06-02 DIAGNOSIS — N179 Acute kidney failure, unspecified: Secondary | ICD-10-CM | POA: Diagnosis not present

## 2023-06-02 DIAGNOSIS — N184 Chronic kidney disease, stage 4 (severe): Secondary | ICD-10-CM | POA: Diagnosis not present

## 2023-06-02 DIAGNOSIS — J9601 Acute respiratory failure with hypoxia: Secondary | ICD-10-CM | POA: Diagnosis not present

## 2023-06-02 DIAGNOSIS — J101 Influenza due to other identified influenza virus with other respiratory manifestations: Secondary | ICD-10-CM | POA: Diagnosis not present

## 2023-06-02 DIAGNOSIS — I5023 Acute on chronic systolic (congestive) heart failure: Secondary | ICD-10-CM | POA: Diagnosis not present

## 2023-06-02 DIAGNOSIS — R570 Cardiogenic shock: Secondary | ICD-10-CM | POA: Diagnosis not present

## 2023-06-02 DIAGNOSIS — Z4682 Encounter for fitting and adjustment of non-vascular catheter: Secondary | ICD-10-CM | POA: Diagnosis not present

## 2023-06-02 DIAGNOSIS — J9602 Acute respiratory failure with hypercapnia: Secondary | ICD-10-CM | POA: Diagnosis not present

## 2023-06-02 DIAGNOSIS — Z515 Encounter for palliative care: Secondary | ICD-10-CM | POA: Diagnosis not present

## 2023-06-02 DIAGNOSIS — E119 Type 2 diabetes mellitus without complications: Secondary | ICD-10-CM | POA: Diagnosis not present

## 2023-06-02 DIAGNOSIS — Z7189 Other specified counseling: Secondary | ICD-10-CM | POA: Diagnosis not present

## 2023-06-02 LAB — BASIC METABOLIC PANEL
Anion gap: 13 (ref 5–15)
BUN: 86 mg/dL — ABNORMAL HIGH (ref 8–23)
CO2: 23 mmol/L (ref 22–32)
Calcium: 7.6 mg/dL — ABNORMAL LOW (ref 8.9–10.3)
Chloride: 100 mmol/L (ref 98–111)
Creatinine, Ser: 1.83 mg/dL — ABNORMAL HIGH (ref 0.44–1.00)
GFR, Estimated: 28 mL/min — ABNORMAL LOW (ref >60)
Glucose, Bld: 227 mg/dL — ABNORMAL HIGH (ref 70–99)
Potassium: 4.8 mmol/L (ref 3.5–5.1)
Sodium: 136 mmol/L (ref 135–145)

## 2023-06-02 LAB — CBC
HCT: 24.5 % — ABNORMAL LOW (ref 36.0–46.0)
Hemoglobin: 8.1 g/dL — ABNORMAL LOW (ref 12.0–15.0)
MCH: 28.5 pg (ref 26.0–34.0)
MCHC: 33.1 g/dL (ref 30.0–36.0)
MCV: 86.3 fL (ref 80.0–100.0)
Platelets: 111 10*3/uL — ABNORMAL LOW (ref 150–400)
RBC: 2.84 MIL/uL — ABNORMAL LOW (ref 3.87–5.11)
RDW: 21 % — ABNORMAL HIGH (ref 11.5–15.5)
WBC: 19.2 10*3/uL — ABNORMAL HIGH (ref 4.0–10.5)
nRBC: 0.6 % — ABNORMAL HIGH (ref 0.0–0.2)

## 2023-06-02 LAB — FIBRINOGEN: Fibrinogen: 480 mg/dL — ABNORMAL HIGH (ref 210–475)

## 2023-06-02 LAB — POCT I-STAT 7, (LYTES, BLD GAS, ICA,H+H)
Acid-base deficit: 2 mmol/L (ref 0.0–2.0)
Acid-base deficit: 2 mmol/L (ref 0.0–2.0)
Bicarbonate: 24.8 mmol/L (ref 20.0–28.0)
Bicarbonate: 25.7 mmol/L (ref 20.0–28.0)
Calcium, Ion: 1.11 mmol/L — ABNORMAL LOW (ref 1.15–1.40)
Calcium, Ion: 1.12 mmol/L — ABNORMAL LOW (ref 1.15–1.40)
HCT: 28 % — ABNORMAL LOW (ref 36.0–46.0)
HCT: 29 % — ABNORMAL LOW (ref 36.0–46.0)
Hemoglobin: 9.5 g/dL — ABNORMAL LOW (ref 12.0–15.0)
Hemoglobin: 9.9 g/dL — ABNORMAL LOW (ref 12.0–15.0)
O2 Saturation: 85 %
O2 Saturation: 100 %
Patient temperature: 37
Patient temperature: 37.1
Potassium: 4.9 mmol/L (ref 3.5–5.1)
Potassium: 4.8 mmol/L (ref 3.5–5.1)
Sodium: 138 mmol/L (ref 135–145)
Sodium: 137 mmol/L (ref 135–145)
TCO2: 26 mmol/L (ref 22–32)
TCO2: 27 mmol/L (ref 22–32)
pCO2 arterial: 53.7 mmHg — ABNORMAL HIGH (ref 32–48)
pCO2 arterial: 57.9 mmHg — ABNORMAL HIGH (ref 32–48)
pH, Arterial: 7.271 — ABNORMAL LOW (ref 7.35–7.45)
pH, Arterial: 7.256 — ABNORMAL LOW (ref 7.35–7.45)
pO2, Arterial: 58 mmHg — ABNORMAL LOW (ref 83–108)
pO2, Arterial: 301 mmHg — ABNORMAL HIGH (ref 83–108)

## 2023-06-02 LAB — ANCA PROFILE
Anti-MPO Antibodies: <0.2 U (ref 0.0–0.9)
Anti-PR3 Antibodies: <0.2 U (ref 0.0–0.9)
Atypical P-ANCA titer: <1:20 {titer}
C-ANCA: <1:20 {titer}
P-ANCA: <1:20 {titer}

## 2023-06-02 LAB — GLUCOSE, CAPILLARY
Glucose-Capillary: 192 mg/dL — ABNORMAL HIGH (ref 70–99)
Glucose-Capillary: 199 mg/dL — ABNORMAL HIGH (ref 70–99)
Glucose-Capillary: 201 mg/dL — ABNORMAL HIGH (ref 70–99)
Glucose-Capillary: 217 mg/dL — ABNORMAL HIGH (ref 70–99)
Glucose-Capillary: 226 mg/dL — ABNORMAL HIGH (ref 70–99)
Glucose-Capillary: 239 mg/dL — ABNORMAL HIGH (ref 70–99)

## 2023-06-02 LAB — ALDOLASE: Aldolase: 12.1 U/L — ABNORMAL HIGH (ref 3.3–10.3)

## 2023-06-02 LAB — FERRITIN: Ferritin: 168 ng/mL (ref 11–307)

## 2023-06-02 LAB — RENAL FUNCTION PANEL
Albumin: 2.6 g/dL — ABNORMAL LOW (ref 3.5–5.0)
Anion gap: 7 (ref 5–15)
BUN: 60 mg/dL — ABNORMAL HIGH (ref 8–23)
CO2: 25 mmol/L (ref 22–32)
Calcium: 7.8 mg/dL — ABNORMAL LOW (ref 8.9–10.3)
Chloride: 102 mmol/L (ref 98–111)
Creatinine, Ser: 1.22 mg/dL — ABNORMAL HIGH (ref 0.44–1.00)
GFR, Estimated: 46 mL/min — ABNORMAL LOW (ref >60)
Glucose, Bld: 270 mg/dL — ABNORMAL HIGH (ref 70–99)
Phosphorus: 3.9 mg/dL (ref 2.5–4.6)
Potassium: 4.4 mmol/L (ref 3.5–5.1)
Sodium: 134 mmol/L — ABNORMAL LOW (ref 135–145)

## 2023-06-02 LAB — COMPREHENSIVE METABOLIC PANEL
ALT: 24 U/L (ref 0–44)
AST: 19 U/L (ref 15–41)
Albumin: 2.6 g/dL — ABNORMAL LOW (ref 3.5–5.0)
Alkaline Phosphatase: 61 U/L (ref 38–126)
Anion gap: 11 (ref 5–15)
BUN: 62 mg/dL — ABNORMAL HIGH (ref 8–23)
CO2: 21 mmol/L — ABNORMAL LOW (ref 22–32)
Calcium: 7.9 mg/dL — ABNORMAL LOW (ref 8.9–10.3)
Chloride: 105 mmol/L (ref 98–111)
Creatinine, Ser: 1.42 mg/dL — ABNORMAL HIGH (ref 0.44–1.00)
GFR, Estimated: 38 mL/min — ABNORMAL LOW (ref >60)
Glucose, Bld: 248 mg/dL — ABNORMAL HIGH (ref 70–99)
Potassium: 4.6 mmol/L (ref 3.5–5.1)
Sodium: 137 mmol/L (ref 135–145)
Total Bilirubin: 1.1 mg/dL (ref 0.0–1.2)
Total Protein: 6 g/dL — ABNORMAL LOW (ref 6.5–8.1)

## 2023-06-02 LAB — APTT: aPTT: 68 s — ABNORMAL HIGH (ref 24–36)

## 2023-06-02 LAB — HEPARIN LEVEL (UNFRACTIONATED): Heparin Unfractionated: 0.26 [IU]/mL — ABNORMAL LOW (ref 0.30–0.70)

## 2023-06-02 LAB — MAGNESIUM: Magnesium: 2.3 mg/dL (ref 1.7–2.4)

## 2023-06-02 LAB — PHOSPHORUS: Phosphorus: 4.8 mg/dL — ABNORMAL HIGH (ref 2.5–4.6)

## 2023-06-02 MED ORDER — INSULIN ASPART 100 UNIT/ML IJ SOLN
10.0000 [IU] | INTRAMUSCULAR | Status: DC
  Administered 2023-06-02 – 2023-06-04 (×12): 10 [IU] via SUBCUTANEOUS

## 2023-06-02 MED ORDER — MIDODRINE HCL 5 MG PO TABS
10.0000 mg | ORAL_TABLET | Freq: Three times a day (TID) | ORAL | Status: DC
  Administered 2023-06-02 – 2023-06-04 (×7): 10 mg
  Filled 2023-06-02 (×7): qty 2

## 2023-06-02 MED ORDER — HEPARIN (PORCINE) 25000 UT/250ML-% IV SOLN
1100.0000 [IU]/h | INTRAVENOUS | Status: DC
  Administered 2023-06-02: 1000 [IU]/h via INTRAVENOUS
  Administered 2023-06-03 – 2023-06-04 (×2): 1100 [IU]/h via INTRAVENOUS
  Filled 2023-06-02 (×3): qty 250

## 2023-06-02 MED ORDER — RENA-VITE PO TABS
1.0000 | ORAL_TABLET | Freq: Every day | ORAL | Status: DC
  Administered 2023-06-02 – 2023-06-03 (×2): 1
  Filled 2023-06-02 (×2): qty 1

## 2023-06-02 MED ORDER — BANATROL TF EN LIQD
60.0000 mL | Freq: Two times a day (BID) | ENTERAL | Status: DC
  Administered 2023-06-02 – 2023-06-04 (×5): 60 mL
  Filled 2023-06-02 (×5): qty 60

## 2023-06-02 MED ORDER — PROSOURCE TF20 ENFIT COMPATIBL EN LIQD
60.0000 mL | Freq: Two times a day (BID) | ENTERAL | Status: DC
  Administered 2023-06-02 – 2023-06-04 (×5): 60 mL
  Filled 2023-06-02 (×5): qty 60

## 2023-06-02 MED ORDER — VITAL 1.5 CAL PO LIQD
1000.0000 mL | ORAL | Status: DC
  Administered 2023-06-02 – 2023-06-03 (×2): 1000 mL

## 2023-06-02 MED FILL — Lidocaine HCl Local Preservative Free (PF) Inj 1%: INTRAMUSCULAR | Qty: 30 | Status: AC

## 2023-06-02 NOTE — Progress Notes (Signed)
 PHARMACY - ANTICOAGULATION CONSULT NOTE  Pharmacy Consult for heparin  Indication: atrial fibrillation  Allergies  Allergen Reactions   Veltassa [Patiromer] Diarrhea   Aldactone [Spironolactone] Diarrhea and Nausea And Vomiting   Darvon [Propoxyphene] Other (See Comments)    Indigestion    Mexitil [Mexiletine] Nausea And Vomiting and Other (See Comments)    Tremors Difficulty walking Blurred vision   Phenergan [Promethazine Hcl] Other (See Comments)    Hyperactivity    Ranexa [Ranolazine Er] Diarrhea   Plavix [Clopidogrel Bisulfate] Rash    Patient Measurements: Height: 5\' 2"  (157.5 cm) Weight: 91.2 kg (201 lb 1 oz) IBW/kg (Calculated) : 50.1 HEPARIN DW (KG): 68.6   Vital Signs: Temp: 98.6 F (37 C) (03/12 1720) BP: 109/54 (03/12 1700) Pulse Rate: 60 (03/12 1720)  Labs: Recent Labs    05/31/23 0427 05/31/23 1610 06/01/23 0413 06/01/23 0422 06/01/23 0923 06/01/23 1614 06/02/23 0325 06/02/23 0336 06/02/23 0442 06/02/23 0444 06/02/23 1329 06/02/23 1657  HGB 8.5*   < > 8.2*   < >  --   --  9.5* 8.1*  --  9.9*  --   --   HCT 25.8*   < > 24.8*   < >  --   --  28.0* 24.5*  --  29.0*  --   --   PLT 76*  --  102*  --   --   --   --  111*  --   --   --   --   APTT 68*  --  77*  --   --   --   --   --  68*  --   --   --   HEPARINUNFRC  --   --   --   --   --   --   --   --   --   --   --  0.26*  CREATININE 3.02*   < > 3.39*  --   --    < >  --  1.83*  --   --  1.42* 1.22*  CKTOTAL  --   --   --   --  45  --   --   --   --   --   --   --    < > = values in this interval not displayed.    Estimated Creatinine Clearance: 41.2 mL/min (A) (by C-G formula based on SCr of 1.22 mg/dL (H)).   Medical History: Past Medical History:  Diagnosis Date   Acute kidney injury superimposed on chronic kidney disease (HCC) 10/02/2022   Allergy    Arthritis    Atrial fibrillation (HCC)    controlled with amiodarone, on coumadin   Chronic renal insufficiency    Chronic systolic  CHF (congestive heart failure) (HCC) 03/20/2013   Chronic systolic heart failure (HCC)    Coronary artery disease 01/31/2011   Diabetes mellitus    type 1   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    History of chicken pox    Hyperlipidemia    Hypertension    Ischemic cardiomyopathy    Knee MCL sprain 03/28/2014   Left knee pain 08/30/2012   Lumbar spondylosis 01/11/2012   Pes anserine bursitis 02/23/2014   Sleep apnea    Stroke [facility]) 2010   eye doctor said she had TIA   Suicide attempt by benzodiazepine overdose (HCC) 01/21/2020   UTI (urinary tract infection) 08/03/2013   Ventricular tachycardia (HCC)    Polymorphic  Medications:  Medications Prior to Admission  Medication Sig Dispense Refill Last Dose/Taking   amiodarone (PACERONE) 200 MG tablet Take 1 tablet (200 mg total) by mouth daily. 30 tablet 6 05/20/2023 Morning   aspirin EC 81 MG tablet Take 1 tablet (81 mg total) by mouth daily. Swallow whole. 30 tablet 12 05/20/2023 Morning   busPIRone (BUSPAR) 5 MG tablet Take 1 tablet (5 mg total) by mouth 2 (two) times daily. 60 tablet 1 05/20/2023 Morning   carvedilol (COREG) 25 MG tablet TAKE 1 TABLET (25 MG TOTAL) BY MOUTH TWICE A DAY WITH MEALS 180 tablet 3 05/20/2023 Morning   Cholecalciferol (VITAMIN D-3 PO) Take 1 tablet by mouth daily.   05/20/2023 Morning   dicyclomine (BENTYL) 10 MG capsule TAKE 1 CAPSULE (10 MG TOTAL) BY MOUTH 4 (FOUR) TIMES DAILY BEFORE MEALS AND AT BEDTIME. 120 capsule 1 05/20/2023 Morning   furosemide (LASIX) 20 MG tablet Take 2 tablets (40 mg) by mouth once a day. 60 tablet 11 05/20/2023 Morning   gabapentin (NEURONTIN) 100 MG capsule Take 200 mg by mouth 2 (two) times daily.   05/20/2023 Morning   insulin aspart (NOVOLOG FLEXPEN) 100 UNIT/ML FlexPen Inject up to 15 units daily under skin as advised (Patient taking differently: Inject 4-5 Units into the skin 3 (three) times daily before meals. Inject up to 15 units daily under skin as advised) 15 mL  11 Past Week   insulin degludec (TRESIBA FLEXTOUCH) 100 UNIT/ML FlexTouch Pen Inject 12-14 Units into the skin daily. (Patient taking differently: Inject 14 Units into the skin at bedtime.) 9 mL 3 05/19/2023 Bedtime   levETIRAcetam (KEPPRA XR) 500 MG 24 hr tablet Take 1 tablet (500 mg total) by mouth at bedtime. 90 tablet 3 05/19/2023 Bedtime   losartan (COZAAR) 25 MG tablet Take 25 mg by mouth daily.   05/20/2023 Morning   Multiple Vitamins-Minerals (EYE VITAMINS PO) Take 1 tablet by mouth 2 (two) times daily.   05/20/2023 Morning   nitroGLYCERIN (NITROSTAT) 0.4 MG SL tablet Place 1 tablet (0.4 mg total) under the tongue every 5 (five) minutes as needed for chest pain. 25 tablet 3 Past Month   omeprazole (PRILOSEC) 20 MG capsule Take 1 capsule by mouth daily.   05/20/2023 Morning   Rivaroxaban (XARELTO) 15 MG TABS tablet Take 1 tablet (15 mg total) by mouth daily with supper. (Patient taking differently: Take 15 mg by mouth at bedtime.) 30 tablet 6 05/19/2023 Bedtime   rosuvastatin (CRESTOR) 40 MG tablet Take 1 tablet (40 mg total) by mouth daily. NEEDS FOLLOW UP APPOINTMENT FOR MORE REFILLS (Patient taking differently: Take 40 mg by mouth at bedtime.) 90 tablet 0 05/19/2023 Bedtime   sertraline (ZOLOFT) 50 MG tablet Take 50 mg by mouth at bedtime.   05/19/2023 Bedtime   Continuous Glucose Sensor (FREESTYLE LIBRE 3 SENSOR) MISC 1 each by Does not apply route every 14 (fourteen) days. 6 each 3    magnesium oxide (MAG-OX) 400 (240 Mg) MG tablet TAKE 1 TABLET BY MOUTH TWICE A DAY (Patient not taking: Reported on 05/20/2023) 180 tablet 1 Not Taking   Scheduled:   amiodarone  200 mg Per Tube Daily   aspirin  81 mg Per Tube Daily   atorvastatin  80 mg Per Tube Daily   busPIRone  5 mg Per Tube BID   Chlorhexidine Gluconate Cloth  6 each Topical Daily   docusate  100 mg Per Tube BID   feeding supplement (PROSource TF20)  60 mL Per Tube BID  fiber supplement (BANATROL TF)  60 mL Per Tube BID   gabapentin  200  mg Per Tube Q12H   hydrocortisone sod succinate (SOLU-CORTEF) inj  100 mg Intravenous Q8H   insulin aspart  0-20 Units Subcutaneous Q4H   insulin aspart  10 Units Subcutaneous Q4H   insulin glargine  15 Units Subcutaneous BID   ipratropium-albuterol  3 mL Nebulization Q6H   midodrine  10 mg Per Tube Q8H   multivitamin  1 tablet Per Tube QHS   mouth rinse  15 mL Mouth Rinse Q2H   pantoprazole (PROTONIX) IV  40 mg Intravenous QHS   polyethylene glycol  17 g Per Tube BID   sennosides  10 mL Per Tube BID   sertraline  50 mg Per Tube QHS    Assessment: 78 yo female here with VT and ICD shock noted with, VDRF and AKI. 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 Previous concern for HIT, HIT antibody negative, bival change to heparin this morning.   Heparin resumed at 1000u/hr, heparin level subtherapeutic at 0.26. HgB 9.9 and PLTS 111. No pauses or issues with heparin infusion per RN.   Goal of Therapy:  aPTT 50-85 sec Anti-Xa 0.3-0.7  Monitor platelets by anticoagulation protocol: Yes   Plan:  Increase heparin to 1100u/hr.  Recheck heparin level in AM.  Monitor s/s bleeding   Estill Batten, PharmD, BCCCP  Clinical Pharmacist 06/02/2023  5:31 PM

## 2023-06-02 NOTE — Telephone Encounter (Signed)
 Patient is in the hospital

## 2023-06-02 NOTE — Progress Notes (Addendum)
 NAME:  IVANKA KIRSHNER, MRN:  [id number], DOB:  [birth year], LOS: 12 ADMISSION DATE:  05/20/2023, CONSULTATION DATE:  05/20/2023 REFERRING MD:  Alinda Money - [facility] CHIEF COMPLAINT:  N/V/Abd pain / Resp failure   History of Present Illness:  78 year old female with medical history significant of hypertension, hyperlipidemia, diabetes, neuropathy, hypothyroidism, CKD 3B, CAD, GERD, CVA, chronic systolic CHF, depression, anxiety, ventricular tachycardia, ventricular fibrillation, atrial fibrillation, status post ICD, obesity, seizure disorder, low back pain, spinal stenosis presenting after feeling unwell for several days and recent ICD discharges.   Patient reports that she has been unwell for possible days.  She is experience nausea vomiting and abdominal pain.  She reports that her defibrillator fired multiple times today.  ED provider confirmed this with the [facility] representative who noted that the device did fire 3 times a day for ventricular fibrillation.  Also noted periods of slow ventricular tachycardia as well as atrial fibrillation.   Patient denies fevers, chills.   ED Course: Vital signs in the ED notable for blood pressure initially in the 70 systolic now improved to the 120s.  Respiratory rate in the 20s.  Requiring 3 L to maintain saturations.  Lab workup included CMP with potassium 3.2, bicarb 18, gap 17, BUN 32, creatinine elevated 2.37 baseline of 2, glucose 2 1, calcium 7.9, protein 6.0, albumin 2.5, T. bili 1.9, AST 1076, ALT 663.  CBC with platelets 142.  BNP elevated to 2548.  Troponin trend 339, 417.  Lipase normal.  Respiratory panel positive for flu A.  Lactic acid pending.  Urinalysis pending.  Blood cultures pending.  VBG with normal pH pCO2 40.7.  Magnesium pending.  Chest x-ray with new bilateral opacities consistent with pneumonia.  CT of the abdomen pelvis showing no acute normality in the abdomen pelvis, bilateral opacity at the lung bases, left renal cysts.  Patient received  ceftriaxone, doxycycline, Tamiflu in the ED.  Started on amiodarone infusion.  Also received morphine, 1 L IV fluids and 40 mEq p.o. potassium x 2.  Cardiology consulted and recommended amiodarone fusion, repletion of magnesium and potassium as needed, adding back beta-blocker as blood pressure allows.  Pertinent Medical History:   has a past medical history of Acute kidney injury superimposed on chronic kidney disease (HCC) (10/02/2022), Allergy, Arthritis, Atrial fibrillation (HCC), Chronic renal insufficiency, Chronic systolic CHF (congestive heart failure) (HCC) (03/20/2013), Chronic systolic heart failure (HCC), Coronary artery disease (01/31/2011), Diabetes mellitus, 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, History of chicken pox, Hyperlipidemia, Hypertension, Ischemic cardiomyopathy, Knee MCL sprain (03/28/2014), Left knee pain (08/30/2012), Lumbar spondylosis (01/11/2012), Pes anserine bursitis (02/23/2014), Sleep apnea, Stroke (HCC) (2010), Suicide attempt by benzodiazepine overdose (HCC) (01/21/2020), UTI (urinary tract infection) (08/03/2013), and Ventricular tachycardia (HCC).   Significant Hospital Events: Including procedures, antibiotic start and stop dates in addition to other pertinent events   2/27 Presented to ED feeling unwell, s/p AICD discharge x 3 for VT/slow VT. Hypotensive with increased WOB prompting intubation. Flu A+,  CXR c/w PNA. 2/28 Remains on high-dose vasopressor support with Levophed. No more episodes of V. Tach. Remain acidotic mixed metabolic and respiratory. 3/1 Remains on ventilator with FiO2 50 PEEP 8, remains on both levo and vaso 3/3 extubated to bipap 3/4 treated w/ lasix still on NIPPV 3/5 inc'd WOB. CXR still w/ marked bilateral airspace disease. ABG w/ sig hypoxia and resp alk. PEEP inc'd on NIPPV. Increased lasix. Widened ABX coverage due to clinical decline  3/6 attempting short rotations of [facility] but still w/  marked WOB.  Goals of care discussion  with family they continued to request aggressive care.  Shared with them my concern about limiting factor would be renal function, given underlying cardiomyopathy she would be a poor candidate for dialysis 3/7 renal function a little worse CVP 6, using albumin and Lasix to try to mobilize total body overload still BiPAP dependent 3/9 reintubated, pan-CT 3/10 AHF consult 3/11 RHC, renal consult  Interim History / Subjective:  No acute events overnight. Got HD cath yesterday and started on CRRT   Objective:  Blood pressure (!) 108/43, pulse 60, temperature 97.7 F (36.5 C), resp. rate (!) 32, height 5\' 2"  (1.575 m), weight 91.2 kg, SpO2 100%. CVP:  [2 mmHg-19 mmHg] 8 mmHg  Vent Mode: PCV FiO2 (%):  [50 %-100 %] 70 % Set Rate:  [30 bmp-32 bmp] 32 bmp PEEP:  [10 cmH20] 10 cmH20 Plateau Pressure:  [24 cmH20] 24 cmH20   Intake/Output Summary (Last 24 hours) at 06/02/2023 6644 Last data filed at 06/02/2023 0347 Gross per 24 hour  Intake 3032.61 ml  Output 5054.8 ml  Net -2022.19 ml   Filed Weights   05/29/23 0500 05/30/23 0600 06/02/23 4259  Weight: 92.7 kg 89.2 kg 91.2 kg   Physical Examination: General: older female, laying in bed, critically ill, Intubated, sedated HEENT: NCAT, anicteric sclera, ett, ogt  Neuro: RASS -4, does not follow commands  Abdomen: rounded, soft, non distended  Lungs: vented, synchronous, rhonchi bilaterally, no wheezing or rales  Skin: warm and dry   Resolved Hospital Problem List:  Septic shock Shock liver, improved  Assessment & Plan:  Acute respiratory failure with hypoxia and hypercapnia- odd case; flu ARDS was 10 days ago; lingering issues thought to be some combination fluid overload and HCAP but both have been treated.  Failed extubation trial and now workup shows (A) normal ESR so doubtful COP/Amio lung (B) normal Pct and (C) persistent upper lobe GGO/crazing paving, (D) elevated CVP but okay coox.  RHC today c/w septic state with high CO.   AKI  on CKD4- stubborn to diuresis Worsening thrombocytopenia- duplex neg; bival start 3/9 and HIT sent but still pretty low suspicion; now improved DM2 with hyperglycemia- improved HFrEF, Afib/flutter on xarelto, hx of VT w/ ICD in place- recent ablation Jan 2025; to restart amio given normal ESR Muscular deconditioning Multiple pressure ulcers- developed over past week  Taken together, we are dealing with persistent vasoplegic state, ARDS type pattern CXR with negative inflammatory markers, minimally elevated wedge, and negative infectious workup to date.  We also have high dead space unable to get pH better than around 7.2 despite aggressive vent measures.  Running out of options here so with throw everything at this: - con't zosyn, micafungin (BAL with ngtd) - stress steroids with solu-cortef 100mg  q8h  - con't midodrine, norepinephrine for BP support  - decrease CRRT UF to 50/hr as intravascularly dry despite still having edema  - aggressive vent to keep pH >7.1  - changing bivalarudin back to heparin per HF (HIT ab negative)   If above fails we are unfortunately at end of life, I have told family this.  Best Practice (right click and "Reselect all SmartList Selections" daily)   Diet/type: TF DVT prophylaxis systemic heparin Pressure ulcer(s): N/A GI prophylaxis: PPI Lines: right internal jugular CVL - 05/20/23, left femoral HD cath  Foley: Yes needed Code Status:  full code Last date of multidisciplinary goals of care discussion: 3/8, continue aggressive care  Critical care time:  33 min   Cristopher Peru, PA-C [facility] 06/02/23 12:09 PM  Please see [email or web address] for pager details.  From 7A-7P if no response, please call [phone] After hours, please call ELink [phone]

## 2023-06-02 NOTE — Progress Notes (Signed)
 PHARMACY - ANTICOAGULATION CONSULT NOTE  Pharmacy Consult for heparin  > Bivalirudin  Indication: atrial fibrillation  Allergies  Allergen Reactions   Veltassa [Patiromer] Diarrhea   Aldactone [Spironolactone] Diarrhea and Nausea And Vomiting   Darvon [Propoxyphene] Other (See Comments)    Indigestion    Mexitil [Mexiletine] Nausea And Vomiting and Other (See Comments)    Tremors Difficulty walking Blurred vision   Phenergan [Promethazine Hcl] Other (See Comments)    Hyperactivity    Ranexa [Ranolazine Er] Diarrhea   Plavix [Clopidogrel Bisulfate] Rash    Patient Measurements: Height: 5\' 2"  (157.5 cm) Weight: 91.2 kg (201 lb 1 oz) IBW/kg (Calculated) : 50.1 HEPARIN DW (KG): 68.6   Vital Signs: Temp: 97.7 F (36.5 C) (03/12 0700) Temp Source: Bladder (03/12 0400) BP: 108/43 (03/12 0700) Pulse Rate: 60 (03/12 0700)  Labs: Recent Labs    05/31/23 0427 05/31/23 1610 06/01/23 0413 06/01/23 0422 06/01/23 0923 06/01/23 1614 06/02/23 0325 06/02/23 0336 06/02/23 0442 06/02/23 0444  HGB 8.5*   < > 8.2*   < >  --   --  9.5* 8.1*  --  9.9*  HCT 25.8*   < > 24.8*   < >  --   --  28.0* 24.5*  --  29.0*  PLT 76*  --  102*  --   --   --   --  111*  --   --   APTT 68*  --  77*  --   --   --   --   --  68*  --   CREATININE 3.02*   < > 3.39*  --   --  3.00*  --  1.83*  --   --   CKTOTAL  --   --   --   --  45  --   --   --   --   --    < > = values in this interval not displayed.    Estimated Creatinine Clearance: 27.5 mL/min (A) (by C-G formula based on SCr of 1.83 mg/dL (H)).   Medical History: Past Medical History:  Diagnosis Date   Acute kidney injury superimposed on chronic kidney disease (HCC) 10/02/2022   Allergy    Arthritis    Atrial fibrillation (HCC)    controlled with amiodarone, on coumadin   Chronic renal insufficiency    Chronic systolic CHF (congestive heart failure) (HCC) 03/20/2013   Chronic systolic heart failure (HCC)    Coronary artery disease  01/31/2011   Diabetes mellitus    type 1   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    History of chicken pox    Hyperlipidemia    Hypertension    Ischemic cardiomyopathy    Knee MCL sprain 03/28/2014   Left knee pain 08/30/2012   Lumbar spondylosis 01/11/2012   Pes anserine bursitis 02/23/2014   Sleep apnea    Stroke (HCC) 2010   eye doctor said she had TIA   Suicide attempt by benzodiazepine overdose (HCC) 01/21/2020   UTI (urinary tract infection) 08/03/2013   Ventricular tachycardia (HCC)    Polymorphic    Medications:  Medications Prior to Admission  Medication Sig Dispense Refill Last Dose/Taking   amiodarone (PACERONE) 200 MG tablet Take 1 tablet (200 mg total) by mouth daily. 30 tablet 6 05/20/2023 Morning   aspirin EC 81 MG tablet Take 1 tablet (81 mg total) by mouth daily. Swallow whole. 30 tablet 12 05/20/2023 Morning   busPIRone (BUSPAR) 5 MG tablet Take  1 tablet (5 mg total) by mouth 2 (two) times daily. 60 tablet 1 05/20/2023 Morning   carvedilol (COREG) 25 MG tablet TAKE 1 TABLET (25 MG TOTAL) BY MOUTH TWICE A DAY WITH MEALS 180 tablet 3 05/20/2023 Morning   Cholecalciferol (VITAMIN D-3 PO) Take 1 tablet by mouth daily.   05/20/2023 Morning   dicyclomine (BENTYL) 10 MG capsule TAKE 1 CAPSULE (10 MG TOTAL) BY MOUTH 4 (FOUR) TIMES DAILY BEFORE MEALS AND AT BEDTIME. 120 capsule 1 05/20/2023 Morning   furosemide (LASIX) 20 MG tablet Take 2 tablets (40 mg) by mouth once a day. 60 tablet 11 05/20/2023 Morning   gabapentin (NEURONTIN) 100 MG capsule Take 200 mg by mouth 2 (two) times daily.   05/20/2023 Morning   insulin aspart (NOVOLOG FLEXPEN) 100 UNIT/ML FlexPen Inject up to 15 units daily under skin as advised (Patient taking differently: Inject 4-5 Units into the skin 3 (three) times daily before meals. Inject up to 15 units daily under skin as advised) 15 mL 11 Past Week   insulin degludec (TRESIBA FLEXTOUCH) 100 UNIT/ML FlexTouch Pen Inject 12-14 Units into the skin  daily. (Patient taking differently: Inject 14 Units into the skin at bedtime.) 9 mL 3 05/19/2023 Bedtime   levETIRAcetam (KEPPRA XR) 500 MG 24 hr tablet Take 1 tablet (500 mg total) by mouth at bedtime. 90 tablet 3 05/19/2023 Bedtime   losartan (COZAAR) 25 MG tablet Take 25 mg by mouth daily.   05/20/2023 Morning   Multiple Vitamins-Minerals (EYE VITAMINS PO) Take 1 tablet by mouth 2 (two) times daily.   05/20/2023 Morning   nitroGLYCERIN (NITROSTAT) 0.4 MG SL tablet Place 1 tablet (0.4 mg total) under the tongue every 5 (five) minutes as needed for chest pain. 25 tablet 3 Past Month   omeprazole (PRILOSEC) 20 MG capsule Take 1 capsule by mouth daily.   05/20/2023 Morning   Rivaroxaban (XARELTO) 15 MG TABS tablet Take 1 tablet (15 mg total) by mouth daily with supper. (Patient taking differently: Take 15 mg by mouth at bedtime.) 30 tablet 6 05/19/2023 Bedtime   rosuvastatin (CRESTOR) 40 MG tablet Take 1 tablet (40 mg total) by mouth daily. NEEDS FOLLOW UP APPOINTMENT FOR MORE REFILLS (Patient taking differently: Take 40 mg by mouth at bedtime.) 90 tablet 0 05/19/2023 Bedtime   sertraline (ZOLOFT) 50 MG tablet Take 50 mg by mouth at bedtime.   05/19/2023 Bedtime   Continuous Glucose Sensor (FREESTYLE LIBRE 3 SENSOR) MISC 1 each by Does not apply route every 14 (fourteen) days. 6 each 3    magnesium oxide (MAG-OX) 400 (240 Mg) MG tablet TAKE 1 TABLET BY MOUTH TWICE A DAY (Patient not taking: Reported on 05/20/2023) 180 tablet 1 Not Taking   Scheduled:   amiodarone  200 mg Per Tube Daily   aspirin  81 mg Per Tube Daily   atorvastatin  80 mg Per Tube Daily   busPIRone  5 mg Per Tube BID   Chlorhexidine Gluconate Cloth  6 each Topical Daily   docusate  100 mg Per Tube BID   free water  50 mL Per Tube Q4H   gabapentin  200 mg Per Tube Q12H   hydrocortisone sod succinate (SOLU-CORTEF) inj  100 mg Intravenous Q8H   insulin aspart  0-20 Units Subcutaneous Q4H   insulin aspart  7 Units Subcutaneous Q4H    insulin glargine  15 Units Subcutaneous BID   ipratropium-albuterol  3 mL Nebulization Q6H   midodrine  10 mg Per Tube TID WC  nutrition supplement (JUVEN)  1 packet Per Tube BID BM   mouth rinse  15 mL Mouth Rinse Q2H   pantoprazole (PROTONIX) IV  40 mg Intravenous QHS   polyethylene glycol  17 g Per Tube BID   sennosides  10 mL Per Tube BID   sertraline  50 mg Per Tube QHS    Assessment: 78 yo female here with VT and ICD shock noted with, VDRF and AKI. 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 Pharmacy consulted to dose heparin.   Heparin level is therapeutic at 0.33 on heparin drip rate 1200 units/hr. Hgb stable 9s plt  trending down to 65 No s/sx of bleeding or infusion issues. Running peripherally and drawn from A-line per RN  Dopplers negative for DVT B/L 3/8. 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   aPTT 68 sec, therapeutic on 0.03 mg/kg/hr.  Hgb 8.1, pltc 111 (stable)   Goal of Therapy:  aPTT 50-85 sec Monitor platelets by anticoagulation protocol: Yes   Plan:  Continue bivalirudin 0.03mg /kg/hr  Daily aPTTs, CBC - check HIT antibody Monitor s/s bleeding   Trixie Rude, PharmD Clinical Pharmacist 06/02/2023  8:25 AM

## 2023-06-02 NOTE — Progress Notes (Signed)
 [name] PROGRESS NOTE  Assessment/ Plan: Pt is a 78 y.o. yo female  with past medical history significant for hypertension, DM, A-fib, CAD, H LD, stroke, CHF, CKD 3B-4 who was admitted with V. tach, influenza A positive, septic and cardiac shock and pneumonia, consulted for AKI on CKD.   # Acute kidney injury on CKD 3b-4 due to ATN: Minimal response with IV diuretics.  RHC with right heart failure.  Started CRRT on 3/11 after placement of HD catheter.   Around 2 L UF.  Continue current CRRT prescription. All 4K bath, UF goal 50-100 mL/per hour, Angiomax for anticoagulation.   #Acute hypoxic respiratory failure due to influenza A, secondary bacterial pneumonia, possible ARDS, septic shock: On antibiotics and pressor per ICU team.   #Acute on chronic systolic CHF initially treated with diuretics and now volume management with CRRT.  Heart failure team is following.   #Thrombocytopenia, HIT negative,  Angiomax discontinued and transitioned back to heparin.  Subjective: Seen and examined in ICU.  Tolerating CRRT well.  Remains intubated, sedated.  Urine output is recorded around 890 cc in 24 hours.  Total UF around 2 L.  Currently on Levophed and sedatives.  Discussed with ICU team. Objective Vital signs in last 24 hours: Vitals:   06/02/23 0956 06/02/23 0957 06/02/23 0958 06/02/23 1000  BP:   (!) 111/52 (!) 111/52  Pulse: 73 79 68 73  Resp: (!) 32 (!) 32 (!) 32 (!) 28  Temp: 97.9 F (36.6 C) 97.9 F (36.6 C) 97.9 F (36.6 C) 97.9 F (36.6 C)  TempSrc:      SpO2: 98% 99% 99% 99%  Weight:      Height:       Weight change:   Intake/Output Summary (Last 24 hours) at 06/02/2023 1023 Last data filed at 06/02/2023 1000 Gross per 24 hour  Intake 3500.26 ml  Output 5430.8 ml  Net -1930.54 ml       Labs: RENAL PANEL Recent Labs  Lab 05/28/23 0422 05/28/23 1109 05/29/23 0416 05/29/23 1735 05/30/23 0316 05/30/23 0330 05/31/23 0427 05/31/23 8295  05/31/23 1719 05/31/23 1819 06/01/23 0413 06/01/23 0422 06/01/23 0831 06/01/23 1614 06/02/23 0325 06/02/23 0336 06/02/23 0444  NA 147*   < > 145   < > 144   < > 140   < > 142   < > 144   < > 142 136 138 136 137  K 3.6   < > 4.1   < > 3.8   < > 4.4   < > 4.6   < > 4.3   < > 4.2 4.9 4.9 4.8 4.8  CL 112*   < > 112*   < > 109   < > 110  --  106  --  106  --   --  100  --  100  --   CO2 20*   < > 20*   < > 19*   < > 19*  --  23  --  21*  --   --  21*  --  23  --   GLUCOSE 313*   < > 228*   < > 94   < > 238*  --  181*  --  159*  --   --  249*  --  227*  --   BUN 93*   < > 108*   < > 120*   < > 136*  --  146*  --  159*  --   --  143*  --  86*  --   CREATININE 2.39*   < > 2.52*   < > 2.60*   < > 3.02*  --  3.44*  --  3.39*  --   --  3.00*  --  1.83*  --   CALCIUM 8.2*   < > 8.4*   < > 8.7*   < > 7.9*  --  8.0*  --  8.0*  --   --  7.6*  --  7.6*  --   MG 2.4  --  2.1  --  2.0  --  2.0  --   --   --  2.1  --   --   --   --  2.3  --   PHOS 4.5  --  4.2  --  5.7*  --  7.6*  --   --   --  8.1*  --   --  7.0*  --  4.8*  --   ALBUMIN 1.8*  --   --   --   --   --   --   --   --   --   --   --   --  2.5*  --   --   --    < > = values in this interval not displayed.    Liver Function Tests: Recent Labs  Lab 05/28/23 0422 06/01/23 1614  AST 18  --   ALT 86*  --   ALKPHOS 84  --   BILITOT 1.1  --   PROT 5.3*  --   ALBUMIN 1.8* 2.5*   No results for input(s): "LIPASE", "AMYLASE" in the last 168 hours. Recent Labs  Lab 05/30/23 0316  AMMONIA 18   CBC: Recent Labs    06/01/23 0413 06/01/23 0422 06/01/23 0729 06/01/23 0831 06/01/23 0923 06/02/23 0325 06/02/23 0336 06/02/23 0444  HGB 8.2*   < > 9.5* 9.5*  --  9.5* 8.1* 9.9*  MCV 86.7  --   --   --   --   --  86.3  --   FERRITIN  --   --   --   --  112  --   --   --    < > = values in this interval not displayed.    Cardiac Enzymes: Recent Labs  Lab 06/01/23 0923  CKTOTAL 45   CBG: Recent Labs  Lab 06/01/23 1611  06/01/23 2007 06/01/23 2359 06/02/23 0323 06/02/23 0745  GLUCAP 221* 223* 192* 217* 201*    Iron Studies:  Recent Labs    06/01/23 0923  FERRITIN 112   Studies/Results: DG Chest Port 1 View Result Date: 06/02/2023 CLINICAL DATA:  Evaluate central venous catheter placement EXAM: PORTABLE CHEST 1 VIEW COMPARISON:  06/01/2023 FINDINGS: Unchanged positioning of the ETT, right internal jugular catheter, and enteric tube. Left chest wall ICD noted with leads in the right atrial appendage and right ventricle. Stable cardiomediastinal contours. Diffuse interstitial opacities noted throughout the right upper lobe and left lung with persistent consolidative changes in the left lower lung. No new findings. IMPRESSION: 1. Stable support apparatus. 2. No change in aeration to the lungs compared with previous exam. Electronically Signed   By: Signa Kell M.D.   On: 06/02/2023 05:53   DG Chest Port 1 View Result Date: 06/01/2023 CLINICAL DATA:  Central line placement. EXAM: PORTABLE CHEST 1 VIEW COMPARISON:  X-ray earlier 06/01/2023 and older FINDINGS: Stable ET tube and feeding tube. Left upper chest  defibrillator as well. Right IJ line with tip overlying the upper right atrium near the SVC junction. Underinflation with enlarged cardiopericardial silhouette. Calcified aorta. Hazy interstitial changes again seen in both lungs diffusely. Less severe at the right lung base. Similar distribution. Apical pleural thickening. No pneumothorax or effusion. Osteopenia with degenerative changes. Overlapping cardiac leads. IMPRESSION: No significant interval change when adjusted for technique. Electronically Signed   By: Karen Kays M.D.   On: 06/01/2023 15:49   CARDIAC CATHETERIZATION Result Date: 06/01/2023 1. Normal PCWP and elevated RA pressure, primarily right-sided failure.   PAPi is, however, preserved at 2.5. 2. Moderate mixed pulmonary arterial/pulmonary venous hypertension. 3. Preserved cardiac output.    DG CHEST PORT 1 VIEW Result Date: 06/01/2023 CLINICAL DATA:  Hypoxia EXAM: PORTABLE CHEST 1 VIEW COMPARISON:  Two days prior FINDINGS: Cardiomegaly with dual-chamber pacer leads from the left. Right IJ line with tip at the SVC. Endotracheal tube with tip at the clavicular heads. The feeding tube at least reaches the stomach Low volume chest with indistinct opacity on both sides, greatest at the retrocardiac lung and right apex. Low lung volumes. There is chronic lung disease with fibrotic features by prior CT. IMPRESSION: Stable hardware positioning and aeration. Electronically Signed   By: Tiburcio Pea M.D.   On: 06/01/2023 06:26    Medications: Infusions:  feeding supplement (VITAL AF 1.2 CAL) 60 mL/hr at 06/02/23 1000   fentaNYL infusion INTRAVENOUS 50 mcg/hr (06/02/23 1000)   heparin 1,000 Units/hr (06/02/23 1000)   levETIRAcetam Stopped (06/02/23 0940)   micafungin (MYCAMINE) 150 mg in sodium chloride 0.9 % 100 mL IVPB 108 mL/hr at 06/02/23 1000   norepinephrine (LEVOPHED) Adult infusion 14 mcg/min (06/02/23 1000)   piperacillin-tazobactam Stopped (06/02/23 0605)   prismasol BGK 4/2.5 400 mL/hr at 06/02/23 0205   prismasol BGK 4/2.5 400 mL/hr at 06/01/23 1357   prismasol BGK 4/2.5 1,500 mL/hr at 06/02/23 0940   propofol (DIPRIVAN) infusion 30 mcg/kg/min (06/02/23 1000)    Scheduled Medications:  amiodarone  200 mg Per Tube Daily   aspirin  81 mg Per Tube Daily   atorvastatin  80 mg Per Tube Daily   busPIRone  5 mg Per Tube BID   Chlorhexidine Gluconate Cloth  6 each Topical Daily   docusate  100 mg Per Tube BID   free water  50 mL Per Tube Q4H   gabapentin  200 mg Per Tube Q12H   hydrocortisone sod succinate (SOLU-CORTEF) inj  100 mg Intravenous Q8H   insulin aspart  0-20 Units Subcutaneous Q4H   insulin aspart  10 Units Subcutaneous Q4H   insulin glargine  15 Units Subcutaneous BID   ipratropium-albuterol  3 mL Nebulization Q6H   midodrine  10 mg Per Tube Q8H    nutrition supplement (JUVEN)  1 packet Per Tube BID BM   mouth rinse  15 mL Mouth Rinse Q2H   pantoprazole (PROTONIX) IV  40 mg Intravenous QHS   polyethylene glycol  17 g Per Tube BID   sennosides  10 mL Per Tube BID   sertraline  50 mg Per Tube QHS    have reviewed scheduled and prn medications.  Physical Exam: General: Ill looking female, intubated, sedated Heart:RRR, s1s2 nl Lungs: Coarse breath sound bilateral. Abdomen:soft, non-distended Extremities: Bilateral LE edema+ Dialysis Access: Left femoral line placed by ICU on 3/11.  Evan Osburn Prasad Loney Domingo 06/02/2023,10:23 AM  LOS: 12 days

## 2023-06-02 NOTE — Progress Notes (Addendum)
 Advanced Heart Failure Rounding Note  Cardiologist: [name], MD  Chief Complaint: Heart Failure  Subjective:    RHC (on NE 12): RA 14, PA 51/16 (33), PCWP 13, CO/CI (fick) 7.29/3.85, PVR 2.7W, PAPi 2.5, SVR 581  CVP 9-10. On CRRT. Net negative 2L. On NE 10. WBC 18>23>19. CXR unchanged this am.   Intubated/Sedated.  Objective:    Weight Range: 91.2 kg Body mass index is 36.77 kg/m.   Vital Signs:   Temp:  [96.8 F (36 C)-99 F (37.2 C)] 97.9 F (36.6 C) (03/12 0840) Pulse Rate:  [54-65] 60 (03/12 0840) Resp:  [15-36] 32 (03/12 0840) BP: (81-196)/(33-83) 144/53 (03/12 0830) SpO2:  [89 %-100 %] 100 % (03/12 0840) Arterial Line BP: (71-182)/(30-56) 138/46 (03/12 0840) FiO2 (%):  [50 %-100 %] 70 % (03/12 0800) Weight:  [91.2 kg] 91.2 kg (03/12 0620) Last BM Date : 06/01/23  Weight change: Filed Weights   05/29/23 0500 05/30/23 0600 06/02/23 0620  Weight: 92.7 kg 89.2 kg 91.2 kg   Intake/Output:  Intake/Output Summary (Last 24 hours) at 06/02/2023 0853 Last data filed at 06/02/2023 0811 Gross per 24 hour  Intake 3032.61 ml  Output 5054.8 ml  Net -2022.19 ml    Physical Exam   CVP 9-10 General: Sedated Cardiac: S1 and S2 present. No murmurs or rub. Resp: Coarse throughout Abdomen: Soft, non-tender, non-distended.  Extremities: Warm and dry.  2+ generalized edema.  Neuro: Sedated/Intubated  Telemetry   Pacing at 60 (personally reviewed)  EKG    N/A   Labs    CBC Recent Labs    06/01/23 0413 06/01/23 0422 06/02/23 0336 06/02/23 0444  WBC 23.8*  --  19.2*  --   HGB 8.2*   < > 8.1* 9.9*  HCT 24.8*   < > 24.5* 29.0*  MCV 86.7  --  86.3  --   PLT 102*  --  111*  --    < > = values in this interval not displayed.   Basic Metabolic Panel Recent Labs    40/98/11 0413 06/01/23 0422 06/01/23 1614 06/02/23 0325 06/02/23 0336 06/02/23 0444  NA 144   < > 136   < > 136 137  K 4.3   < > 4.9   < > 4.8 4.8  CL 106  --  100  --  100  --    CO2 21*  --  21*  --  23  --   GLUCOSE 159*  --  249*  --  227*  --   BUN 159*  --  143*  --  86*  --   CREATININE 3.39*  --  3.00*  --  1.83*  --   CALCIUM 8.0*  --  7.6*  --  7.6*  --   MG 2.1  --   --   --  2.3  --   PHOS 8.1*  --  7.0*  --  4.8*  --    < > = values in this interval not displayed.   Liver Function Tests Recent Labs    06/01/23 1614  ALBUMIN 2.5*   No results for input(s): "LIPASE", "AMYLASE" in the last 72 hours. Cardiac Enzymes Recent Labs    06/01/23 0923  CKTOTAL 45   BNP: BNP (last 3 results) Recent Labs    03/12/23 1107 05/20/23 1223 05/31/23 0634  BNP 124.1* 2,548.1* 1,837.9*   ProBNP (last 3 results) No results for input(s): "PROBNP" in the last 8760 hours.  D-Dimer  No results for input(s): "DDIMER" in the last 72 hours. Hemoglobin A1C No results for input(s): "HGBA1C" in the last 72 hours. Fasting Lipid Panel Recent Labs    06/01/23 0413  TRIG 36   Thyroid Function Tests No results for input(s): "TSH", "T4TOTAL", "T3FREE", "THYROIDAB" in the last 72 hours.  Invalid input(s): "FREET3"  Other results:  Imaging   DG Chest Port 1 View Result Date: 06/02/2023 CLINICAL DATA:  Evaluate central venous catheter placement EXAM: PORTABLE CHEST 1 VIEW COMPARISON:  06/01/2023 FINDINGS: Unchanged positioning of the ETT, right internal jugular catheter, and enteric tube. Left chest wall ICD noted with leads in the right atrial appendage and right ventricle. Stable cardiomediastinal contours. Diffuse interstitial opacities noted throughout the right upper lobe and left lung with persistent consolidative changes in the left lower lung. No new findings. IMPRESSION: 1. Stable support apparatus. 2. No change in aeration to the lungs compared with previous exam. Electronically Signed   By: Signa Kell M.D.   On: 06/02/2023 05:53   DG Chest Port 1 View Result Date: 06/01/2023 CLINICAL DATA:  Central line placement. EXAM: PORTABLE CHEST 1 VIEW  COMPARISON:  X-ray earlier 06/01/2023 and older FINDINGS: Stable ET tube and feeding tube. Left upper chest defibrillator as well. Right IJ line with tip overlying the upper right atrium near the SVC junction. Underinflation with enlarged cardiopericardial silhouette. Calcified aorta. Hazy interstitial changes again seen in both lungs diffusely. Less severe at the right lung base. Similar distribution. Apical pleural thickening. No pneumothorax or effusion. Osteopenia with degenerative changes. Overlapping cardiac leads. IMPRESSION: No significant interval change when adjusted for technique. Electronically Signed   By: Karen Kays M.D.   On: 06/01/2023 15:49   Medications:    Scheduled Medications:  amiodarone  200 mg Per Tube Daily   aspirin  81 mg Per Tube Daily   atorvastatin  80 mg Per Tube Daily   busPIRone  5 mg Per Tube BID   Chlorhexidine Gluconate Cloth  6 each Topical Daily   docusate  100 mg Per Tube BID   free water  50 mL Per Tube Q4H   gabapentin  200 mg Per Tube Q12H   hydrocortisone sod succinate (SOLU-CORTEF) inj  100 mg Intravenous Q8H   insulin aspart  0-20 Units Subcutaneous Q4H   insulin aspart  10 Units Subcutaneous Q4H   insulin glargine  15 Units Subcutaneous BID   ipratropium-albuterol  3 mL Nebulization Q6H   midodrine  10 mg Per Tube Q8H   nutrition supplement (JUVEN)  1 packet Per Tube BID BM   mouth rinse  15 mL Mouth Rinse Q2H   pantoprazole (PROTONIX) IV  40 mg Intravenous QHS   polyethylene glycol  17 g Per Tube BID   sennosides  10 mL Per Tube BID   sertraline  50 mg Per Tube QHS   Infusions:  bivalirudin (ANGIOMAX) 250 mg in sodium chloride 0.9 % 500 mL (0.5 mg/mL) infusion 0.03 mg/kg/hr (06/02/23 0800)   feeding supplement (VITAL AF 1.2 CAL) 60 mL/hr at 06/02/23 0800   fentaNYL infusion INTRAVENOUS 50 mcg/hr (06/02/23 0800)   levETIRAcetam Stopped (06/01/23 2154)   micafungin (MYCAMINE) 150 mg in sodium chloride 0.9 % 100 mL IVPB Stopped (06/01/23  1454)   norepinephrine (LEVOPHED) Adult infusion 10 mcg/min (06/02/23 0800)   piperacillin-tazobactam Stopped (06/02/23 0605)   prismasol BGK 4/2.5 400 mL/hr at 06/02/23 0205   prismasol BGK 4/2.5 400 mL/hr at 06/01/23 1357   prismasol BGK 4/2.5 1,500 mL/hr  at 06/02/23 0615   propofol (DIPRIVAN) infusion 30 mcg/kg/min (06/02/23 0800)   PRN Medications: acetaminophen (TYLENOL) oral liquid 160 mg/5 mL, bisacodyl, fentaNYL, hydrALAZINE, ipratropium-albuterol, midazolam, mouth rinse  Patient Profile    [name] is a 78 year old with a history of DMII,  CKD stage IV, A fib, VT, St Jude ICD, CAD, HLD, HTN, OSA, CVA, VT ablation 1/25, and HFrEF.    Admitted with VT x3 , Flu A , CAP, and shock.  Assessment/Plan   1. Mixed Shock Acute Hypoxic Respiratory Failure Aspiration PNA - Primarily septic/distributive. CAP/VT/Flu A. Lactic acid 5.2, now cleared. Placed on multiple pressors and antibiotics. SVR 581.  - Bcx 2/27 negative. BAL consistent with aspiration, no growth - WBC 18>23>19 - Intubated. Vent per CCM - Continue NE for BP support, currently at 8 - On empiric board-spectrum antibiotics   2. Acute on Chronic HFrEF - ICM  - Echo this admission with EF 40% with low normal RV function, weakly positive bubble study, IVC dilated.  - RHC (on NE 12): RA 14, PA 51/16 (33), PCWP 13, CO/CI (fick) 7.29/3.85, PVR 2.7W, PAPi 2.5, SVR 581 - CVP now 9-10 on CRRT for volume removal (failed diuresis) - ZOX:WRUE roughly 1:1 on cath yesterday.  -With low SVR, would decrease UF to as to not lower PCWP too much - On NE for BP support, wean as able - Add Unna boots    3. Thrombocytopenia - improving, 60>102>111 - PF4 negative. On Bilvalirudin.    4. PAF/flutter VT - Long VT history. Does not tolerate mexilitine. S/P VT ablation 1/25.  - PAF on admit. VT with ATP this admission. - In NSR on tele - continue amio 200 mg daily - continue bival gtt   5. AKI on CKD Stage IIIb - BUN/Cr up to  160/3.4 - Nephrology following. CRRT started 3/11 - Recommend decreasing volume removal as able   6. CAD - Last cath in 7/24 with severe stenosis distal LCx managed medically, patent stents  - Elevated trops on admit. Demand ischemia in the setting of shock - continue asa + statin   8. GOC  - Palliative Care following. Remains Full code at this time  Length of Stay: 12  CRITICAL CARE Performed by: [place] Lee  Total critical care time: 15 minutes  Critical care time was exclusive of separately billable procedures and treating other patients.  Critical care was necessary to treat or prevent imminent or life-threatening deterioration.  Critical care was time spent personally by me on the following activities: development of treatment plan with patient and/or surrogate as well as nursing, discussions with consultants, evaluation of patient's response to treatment, examination of patient, obtaining history from patient or surrogate, ordering and performing treatments and interventions, ordering and review of laboratory studies, ordering and review of radiographic studies, pulse oximetry and re-evaluation of patient's condition.  Length of Stay: 60  [place] Lee, NP  06/02/2023, 8:53 AM  Advanced Heart Failure Team Pager [phone] (M-F; 7a - 5p)  Please contact [facility] for night-coverage after hours (5p -7a ) and weekends on [email or web address]  Patient seen with NP, I formulated the plan and agree with the above note .  CVVH ongoing, pulling UF net negative 100 cc/hr (-1959 for last day).  CVP 11 on my read.   HIT negative, plts rising.   She remains on Zosyn/micafungin with WBCs 19.   General: NAD Neck: JVP 10 cm, no thyromegaly or thyroid nodule.  Lungs: Distant BS CV: Nondisplaced PMI.  Heart regular S1/S2, no S3/S4, no murmur.  Trace ankle edema.   Abdomen: Soft, nontender, no hepatosplenomegaly, no distention.  Skin: Intact without lesions or rashes.  Neurologic: Sedated on  vent Extremities: No clubbing or cyanosis.  HEENT: Normal.   1. Shock: Suspect primarily septic/distributive.  Not on inotropes.  Currently on NE 8.  RHC 3/11 with high cardiac output and low SVR at 581.   - Titrate NE as able to maintain MAP.  - Continue antibiotic coverage.  2. Acute on chronic systolic CHF:  Ischemic cardiomyopathy.  Echo this admission with EF 40% with low normal RV function, weakly positive bubble study, IVC dilated. This is comparable to the past.  AKI with attempt at diuresis, only 620 cc UOP on aggressive regimen yesterday.  RHC 3/11 showed primarily RV failure with RA 14, PCWP 13.  Cardiac output high with primarily pulmonary venous hypertension.  She remains on NE 8 to maintain MAP. Given minimal response to diuretics with worsening creatinine, CVVH started on 3/11.  CVP 11 today, pulling UF net negative 100 cc/hr.  - With sepsis physiology and primarily RV failure (CVP 11 today), would be gentle with UF, aim for net 50-100 cc/hr for now.   - She does not require inotropes.  3. Acute hypoxemic respiratory failure: Influenza A and likely secondary bacterial PNA with possible aspiration and now ARDS picture.  Septic shock picture on RHC.  - Continue Zosyn and micafungin per CCM.  4. AKI on CKD stage 3: Creatinine baseline around 2.  Now suspect ATN with AKI in setting of shock. BUN/creatinine 146/3.44 => 159/3.39 => started CVVH on 3/11.  - Continue CVVH as above, aim for UF net 50-100 cc/hr today.  5. Atrial fibrillation/flutter: Paroxysmal.  In NSR  - Continue amiodarone po.  - HIT negative, can transition bivalidudin back to heparin gtt.  6. Thrombocytopenia: plts 76 => 102 => 111. HIT negative, suspect inflammatory.  7. CAD: Last cath in 7/24 with severe stenosis distal LCx managed medically, patent stents. No ACS this admission.  - Continue statin.  8. 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  Does not tolerate mexiletine.  VT ablation 1/25.  Had VT at admission with ATP. No  further VT overnight.  - Continue amiodarone po.   CRITICAL CARE Performed by: [name]  Total critical care time: 40 minutes  Critical care time was exclusive of separately billable procedures and treating other patients.  Critical care was necessary to treat or prevent imminent or life-threatening deterioration.  Critical care was time spent personally by me on the following activities: development of treatment plan with patient and/or surrogate as well as nursing, discussions with consultants, evaluation of patient's response to treatment, examination of patient, obtaining history from patient or surrogate, ordering and performing treatments and interventions, ordering and review of laboratory studies, ordering and review of radiographic studies, pulse oximetry and re-evaluation of patient's condition.  [name] 06/02/2023 9:04 AM

## 2023-06-02 NOTE — Progress Notes (Signed)
 EP following peripherally for recovery.  Reviewed PCCM notes, possibly considering transition of care toward Palliative.  Pt remains critically ill on mechanical ventilation, CRRT. Please call back if new EP needs in the interim.      Canary Brim, NP-C, AGACNP-BC [facility] - Electrophysiology  06/02/2023, 2:03 PM

## 2023-06-02 NOTE — Progress Notes (Signed)
 Orthopedic Tech Progress Note Patient Details:  [name] December 10, 1945 [id number]  Ortho Devices Type of Ortho Device: Radio broadcast assistant Ortho Device/Splint Location: BLE Ortho Device/Splint Interventions: Application   Post Interventions Patient Tolerated: Well  Genelle Bal Georgiana Spillane 06/02/2023, 10:24 AM

## 2023-06-02 NOTE — Progress Notes (Signed)
 Palliative Medicine Progress Note   Patient Name: [name]       Date: 06/02/2023 DOB: March 01, 1946  Age: 77 y.o. MRN#: [id number] Attending Physician: Lorin Glass, MD Primary Care Physician: Aviva Kluver Admit Date: 05/20/2023   HPI/Patient Profile: 78 y.o. female  with past medical history of chronic HFrEF, status post ICD, diabetes, neuropathy, CAD, CKD stage IIIb, atrial fibrillation, seizure disorder, ventricular fibrillation, hypertension, and hyperlipidemia.  She presented to the ED on 05/20/2023 feeling unwell for several days and reporting her ICD fired.  She has found to have influenza A and pneumonia. She was intubated in the ED due to increased work of breathing prompting intubation. She was in septic shock requiring vasopressor support.    Significant Hospital Events: 3/3 - extubated to BiPAP  3/5 - increased WOB, CXR still with marked bilateral airspace disease. Widened antibiotic coverage 3/6 - did not tolerate trial of HHFNC 3/7 - worsening renal function, still BiPAP dependent 3/8 - palliative consult, GOC with family requesting to continue full scope care 3/9 - reintubated 3/11 - RHC, started CRRT   Subjective: Chart reviewed including labs, vital signs, imaging, and progress notes. Update received from RN. Patient is intubated, on CRRT, and remains on vasopressor support.   I spoke with son/Brian by phone. Continued conversation had regarding the seriousness of patient's current medical situation and her very guarded prognosis . I shared my concern that patient's condition has not improved despite ongoing aggressive interventions. I also shared my concern that patient is currently full code and that resuscitation efforts would have a poor outcome.   Arlys John states that  he plans to visit tomorrow morning. He is agreeable to meet again in-person to discuss GOC.     Objective:  Physical Exam Vitals reviewed.  Constitutional:      General: She is not in acute distress.    Appearance: She is ill-appearing.     Interventions: She is sedated.     Comments: Anasarca  Pulmonary:     Comments: intubated             Palliative Medicine Assessment & Plan   Assessment: Principal Problem:   Acute respiratory failure with hypoxia (HCC) Active Problems:   Implantable cardioverter-defibrillator (ICD) in situ   PAF (paroxysmal atrial fibrillation) (HCC)   Coronary artery disease  HTN (hypertension)   Obesity, unspecified   Chronic systolic CHF (congestive heart failure) (HCC)   Lower back pain   Spinal stenosis of lumbar region   Diabetic peripheral neuropathy associated with type 2 diabetes mellitus (HCC)   Hyperlipidemia   Hypothyroidism   DM (diabetes mellitus), type 2 with renal complications (HCC)   VT (ventricular tachycardia) (HCC)   Acute renal failure superimposed on stage 3b chronic kidney disease (HCC)   Seizure disorder (HCC)   Ventricular fibrillation (HCC)   Gastroesophageal reflux disease without esophagitis   History of cerebrovascular accident   Influenza A    Recommendations/Plan: Continue full scope care I plan to meet with son/Brian tomorrow morning   Code Status: Full code   Prognosis:  Very guarded  Discharge Planning: To Be Determined   Thank you for allowing the Palliative Medicine Team to assist in the care of this patient.   Time: 28 minutes   Merry Proud, NP   Please contact Palliative Medicine Team phone at [phone] for questions and concerns.  For individual providers, please see AMION.

## 2023-06-02 NOTE — Progress Notes (Signed)
 Nutrition Follow-up  DOCUMENTATION CODES:   Not applicable  INTERVENTION:   Tube Feeding via OG: Change to Vital 1.5 to provide less free water Vital 1.5 at 45 ml/hr Pro-Source TF20 60 mL BID TF provides 113 g of protein, 1780 kcals, 821 mL of free water  D/C free water flush of 30 mL q 4 hours  Recommend decreasing bowel regimen Add Banatrol TF BID as stool thickening agent, each packet contains 5g of soluble fiber  Add Renal MVI daily  Hold Juven for now, reassess for appropriateness   NUTRITION DIAGNOSIS:   Inadequate oral intake related to acute illness as evidenced by NPO status.  Being addressed via TF   GOAL:   Provide needs based on ASPEN/SCCM guidelines  Progressing  MONITOR:   Vent status, Labs, Weight trends, TF tolerance, I & O's, Skin  REASON FOR ASSESSMENT:   Consult Enteral/tube feeding initiation and management, Assessment of nutrition requirement/status  ASSESSMENT:   78 yo female admitted with acute respiratory failure with bacterial pneumonia in addition to Flu A+, severe sepsis with shock, AKI on CKD stage 3b, shock liver. PMH includes chronic biventricular heart failure (EF 35-40% 12/2022), DM  2/27 Admitted, Intubated, CT A/P with no acute process in abdomen or pelvis  3/03 Extubated, BiPap 3/05 Cortrak placed, Remains on BiPap 3/09 Re-Intubated, Bronch 3/11 CRRT initiated  Pt remains sedated on vent support, sedated with fentanyl and propofol drips Levophed at 14, noted higher SBP with some DBP <50, some MAPs<65 CRRT initiated yesterday, current net UF 50-100 ml/hr, pt with significant non-pitting edema/anasarca. UOP 890 mL in 24 hours, 70 mL thus far today  Significant non-pitting edema in bilateral UE/LE, abdominal and pelvic region  +stool via rectal tube, noted pt remains on scheduled bowel regimen. 200 mL of stool in 24 hours; per RN, bowel regimen administered this AM, noted 800 mL today thus far  Current wt 91.2 kg; noted  highest wt 95 kg with admission wt closer to 84/85 kg  Labs: Phosphorus improved to 4.8  Sodium 136 (wdl) Potassium 4.8 (wdl) Magnesium 2.3 (wdl) CBGs 192-226 (goal 140-180)  Meds:  Colace BID Miralax BID Senokot BID Solucortef SS novolog, novolog 10 units q 4 hours, lantus 15 units BID   NUTRITION - FOCUSED PHYSICAL EXAM:  Flowsheet Row Most Recent Value  Orbital Region No depletion  Upper Arm Region Unable to assess  Thoracic and Lumbar Region Unable to assess  Buccal Region Unable to assess  Temple Region Mild depletion  Clavicle Bone Region No depletion  Clavicle and Acromion Bone Region No depletion  Scapular Bone Region Unable to assess  Dorsal Hand Unable to assess  Patellar Region Unable to assess  Anterior Thigh Region Unable to assess  Posterior Calf Region Unable to assess  Edema (RD Assessment) Severe  Hair Reviewed  Eyes Unable to assess  Mouth Other (Comment)  [extremely poor dentition, missing teeth, severe gum recession, loose tooth per RN, ?bleeding]  Skin Reviewed  Nails Reviewed       Diet Order:   Diet Order             Diet NPO time specified  Diet effective now                   EDUCATION NEEDS:   Not appropriate for education at this time  Skin:  Skin Assessment: Skin Integrity Issues: Skin Integrity Issues:: DTI, Other (Comment) DTI: L sacrum Other: MASD to vertebral column  Last BM:  3/10-  via rectal tube  Height:   Ht Readings from Last 1 Encounters:  05/20/23 5\' 2"  (1.575 m)    Weight:   Wt Readings from Last 1 Encounters:  06/02/23 91.2 kg    BMI:  Body mass index is 36.77 kg/m.  Estimated Nutritional Needs:   Kcal:  1600-1800kcal/day  Protein:  >100g/day  Fluid:  1.3-1.5L/day   Romelle Starcher MS, RDN, LDN, CNSC Registered Dietitian 3 Clinical Nutrition RD Inpatient Contact Info in Amion

## 2023-06-02 NOTE — TOC Progression Note (Addendum)
 Transition of Care [facility]) - Progression Note    Patient Details  Name: [name] MRN: [id number] Date of Birth: 1946/03/06  [facility] CM/SW Contact  Nicanor Bake Phone Number: [phone] 06/02/2023, 9:30 AM  Clinical Narrative:   9:30 AM- HF CSW called to check-in and follow up with the pts family. CSW called pts daughter, Misty Stanley and left a VM asking to be called back. CSW will continue to call to make contact and check-in with family.   9:34 AM- HF CSW received a call back from pts daughter. At this time the family does not have any questions the the CSW could answer. Pts daughter inquired about her fluid build up and CSW encouraged the family to speak with the medical team.   TOC will continue following.     Expected Discharge Plan: IP Rehab Facility Barriers to Discharge: Continued Medical Work up  Expected Discharge Plan and Services   Discharge Planning Services: CM Consult   Living arrangements for the past 2 months: Single Family Home                                       Social Determinants of Health (SDOH) Interventions SDOH Screenings   Food Insecurity: No Food Insecurity (05/20/2023)  Housing: Low Risk  (05/20/2023)  Transportation Needs: No Transportation Needs (05/20/2023)  Utilities: Not At Risk (05/20/2023)  Depression (PHQ2-9): Low Risk  (07/29/2018)  Financial Resource Strain: Patient Declined (07/17/2022)   Received from [facility], [facility]  Physical Activity: Insufficiently Active (07/16/2022)   Received from [facility], [facility]  Social Connections: Socially Isolated (05/20/2023)  Stress: Stress Concern Present (07/16/2022)   Received from [facility], [facility]  Tobacco Use: Medium Risk (05/20/2023)    Readmission Risk Interventions    10/06/2022   10:37 AM  Readmission Risk Prevention Plan  PCP or Specialist Appt within 5-7 Days Complete  Home Care Screening Complete  Medication Review  (RN CM) Complete

## 2023-06-03 DIAGNOSIS — J9601 Acute respiratory failure with hypoxia: Secondary | ICD-10-CM | POA: Diagnosis not present

## 2023-06-03 DIAGNOSIS — Z515 Encounter for palliative care: Secondary | ICD-10-CM | POA: Diagnosis not present

## 2023-06-03 DIAGNOSIS — I5022 Chronic systolic (congestive) heart failure: Secondary | ICD-10-CM | POA: Diagnosis not present

## 2023-06-03 DIAGNOSIS — N179 Acute kidney failure, unspecified: Secondary | ICD-10-CM | POA: Diagnosis not present

## 2023-06-03 DIAGNOSIS — N184 Chronic kidney disease, stage 4 (severe): Secondary | ICD-10-CM | POA: Diagnosis not present

## 2023-06-03 DIAGNOSIS — E1165 Type 2 diabetes mellitus with hyperglycemia: Secondary | ICD-10-CM | POA: Diagnosis not present

## 2023-06-03 DIAGNOSIS — J9602 Acute respiratory failure with hypercapnia: Secondary | ICD-10-CM | POA: Diagnosis not present

## 2023-06-03 DIAGNOSIS — Z7189 Other specified counseling: Secondary | ICD-10-CM | POA: Diagnosis not present

## 2023-06-03 DIAGNOSIS — R570 Cardiogenic shock: Secondary | ICD-10-CM | POA: Diagnosis not present

## 2023-06-03 DIAGNOSIS — J101 Influenza due to other identified influenza virus with other respiratory manifestations: Secondary | ICD-10-CM | POA: Diagnosis not present

## 2023-06-03 DIAGNOSIS — I5023 Acute on chronic systolic (congestive) heart failure: Secondary | ICD-10-CM | POA: Diagnosis not present

## 2023-06-03 LAB — RENAL FUNCTION PANEL
Albumin: 2.5 g/dL — ABNORMAL LOW (ref 3.5–5.0)
Albumin: 2.2 g/dL — ABNORMAL LOW (ref 3.5–5.0)
Anion gap: 5 (ref 5–15)
Anion gap: 11 (ref 5–15)
BUN: 44 mg/dL — ABNORMAL HIGH (ref 8–23)
BUN: 37 mg/dL — ABNORMAL HIGH (ref 8–23)
CO2: 26 mmol/L (ref 22–32)
CO2: 23 mmol/L (ref 22–32)
Calcium: 8 mg/dL — ABNORMAL LOW (ref 8.9–10.3)
Calcium: 7.6 mg/dL — ABNORMAL LOW (ref 8.9–10.3)
Chloride: 103 mmol/L (ref 98–111)
Chloride: 103 mmol/L (ref 98–111)
Creatinine, Ser: 1.05 mg/dL — ABNORMAL HIGH (ref 0.44–1.00)
Creatinine, Ser: 0.88 mg/dL (ref 0.44–1.00)
GFR, Estimated: 55 mL/min — ABNORMAL LOW (ref >60)
GFR, Estimated: >60 mL/min (ref >60)
Glucose, Bld: 166 mg/dL — ABNORMAL HIGH (ref 70–99)
Glucose, Bld: 202 mg/dL — ABNORMAL HIGH (ref 70–99)
Phosphorus: 3 mg/dL (ref 2.5–4.6)
Phosphorus: 2.8 mg/dL (ref 2.5–4.6)
Potassium: 4.2 mmol/L (ref 3.5–5.1)
Potassium: 4.2 mmol/L (ref 3.5–5.1)
Sodium: 134 mmol/L — ABNORMAL LOW (ref 135–145)
Sodium: 137 mmol/L (ref 135–145)

## 2023-06-03 LAB — GLUCOSE, CAPILLARY
Glucose-Capillary: 109 mg/dL — ABNORMAL HIGH (ref 70–99)
Glucose-Capillary: 140 mg/dL — ABNORMAL HIGH (ref 70–99)
Glucose-Capillary: 159 mg/dL — ABNORMAL HIGH (ref 70–99)
Glucose-Capillary: 162 mg/dL — ABNORMAL HIGH (ref 70–99)
Glucose-Capillary: 194 mg/dL — ABNORMAL HIGH (ref 70–99)

## 2023-06-03 LAB — FUNGITELL BETA-D-GLUCAN
Fungitell Value:: <31.25 pg/mL
Result Name:: NEGATIVE

## 2023-06-03 LAB — CBC
HCT: 26.2 % — ABNORMAL LOW (ref 36.0–46.0)
Hemoglobin: 8.4 g/dL — ABNORMAL LOW (ref 12.0–15.0)
MCH: 28.4 pg (ref 26.0–34.0)
MCHC: 32.1 g/dL (ref 30.0–36.0)
MCV: 88.5 fL (ref 80.0–100.0)
Platelets: 155 10*3/uL (ref 150–400)
RBC: 2.96 MIL/uL — ABNORMAL LOW (ref 3.87–5.11)
RDW: 21.4 % — ABNORMAL HIGH (ref 11.5–15.5)
WBC: 25.3 10*3/uL — ABNORMAL HIGH (ref 4.0–10.5)
nRBC: 1.7 % — ABNORMAL HIGH (ref 0.0–0.2)

## 2023-06-03 LAB — POCT I-STAT 7, (LYTES, BLD GAS, ICA,H+H)
Acid-base deficit: 1 mmol/L (ref 0.0–2.0)
Bicarbonate: 25.4 mmol/L (ref 20.0–28.0)
Calcium, Ion: 1.16 mmol/L (ref 1.15–1.40)
HCT: 29 % — ABNORMAL LOW (ref 36.0–46.0)
Hemoglobin: 9.9 g/dL — ABNORMAL LOW (ref 12.0–15.0)
O2 Saturation: 95 %
Potassium: 4.3 mmol/L (ref 3.5–5.1)
Sodium: 136 mmol/L (ref 135–145)
TCO2: 27 mmol/L (ref 22–32)
pCO2 arterial: 48.5 mmHg — ABNORMAL HIGH (ref 32–48)
pH, Arterial: 7.326 — ABNORMAL LOW (ref 7.35–7.45)
pO2, Arterial: 81 mmHg — ABNORMAL LOW (ref 83–108)

## 2023-06-03 LAB — COOXEMETRY PANEL
Carboxyhemoglobin: 2.2 % — ABNORMAL HIGH (ref 0.5–1.5)
Methemoglobin: <0.7 % (ref 0.0–1.5)
O2 Saturation: 82.9 %
Total hemoglobin: 8 g/dL — ABNORMAL LOW (ref 12.0–16.0)

## 2023-06-03 LAB — C3 COMPLEMENT: C3 Complement: 71 mg/dL — ABNORMAL LOW (ref 82–167)

## 2023-06-03 LAB — APTT
aPTT: 131 s — ABNORMAL HIGH (ref 24–36)
aPTT: 107 s — ABNORMAL HIGH (ref 24–36)

## 2023-06-03 LAB — MAGNESIUM
Magnesium: 2.5 mg/dL — ABNORMAL HIGH (ref 1.7–2.4)
Magnesium: 2.3 mg/dL (ref 1.7–2.4)

## 2023-06-03 LAB — HEPARIN LEVEL (UNFRACTIONATED)
Heparin Unfractionated: 0.45 [IU]/mL (ref 0.30–0.70)
Heparin Unfractionated: 0.4 [IU]/mL (ref 0.30–0.70)

## 2023-06-03 LAB — CYCLIC CITRUL PEPTIDE ANTIBODY, IGG/IGA: CCP Antibodies IgG/IgA: 3 U (ref 0–19)

## 2023-06-03 LAB — C4 COMPLEMENT: Complement C4, Body Fluid: 15 mg/dL (ref 12–38)

## 2023-06-03 LAB — ASPERGILLUS ANTIGEN, BAL/SERUM: Aspergillus Ag, BAL/Serum: 0.03 {index} (ref 0.00–0.49)

## 2023-06-03 LAB — RHEUMATOID FACTOR: Rheumatoid fact SerPl-aCnc: 12.3 [IU]/mL (ref <14.0)

## 2023-06-03 MED ORDER — INSULIN GLARGINE 100 UNIT/ML ~~LOC~~ SOLN
20.0000 [IU] | Freq: Two times a day (BID) | SUBCUTANEOUS | Status: DC
  Administered 2023-06-03 – 2023-06-04 (×3): 20 [IU] via SUBCUTANEOUS
  Filled 2023-06-03 (×4): qty 0.2

## 2023-06-03 NOTE — Progress Notes (Signed)
 PT Cancellation Note  Patient Details Name: [name] MRN: [id number] DOB: 02-25-1946   Cancelled Treatment:    Reason Eval/Treat Not Completed: Patient not medically ready. Per RN, pt is sedated and not currently following commands. Pt is not able to participate meaningfully in PT at this time. Acute PT will sign off as pt has not been able to engage in therapy for multiple days. Please re-consult when pt is appropriate.  Hilton Cork, PT, DPT Secure Chat Preferred  Rehab Office [phone]   Arturo Morton Brion Aliment 06/03/2023, 8:03 AM

## 2023-06-03 NOTE — Progress Notes (Signed)
 Afternoon round: no real changes. Having more ectopy so repeating mag. Elytes on bmp a bit earlier were normal. Family slowly starting to arrive and then anticipate transition to comfort care based on conversation this AM with palliative and son who is Management consultant.

## 2023-06-03 NOTE — Progress Notes (Signed)
 PHARMACY - ANTICOAGULATION CONSULT NOTE  Pharmacy Consult for heparin  Indication: atrial fibrillation  Allergies  Allergen Reactions   Veltassa [Patiromer] Diarrhea   Aldactone [Spironolactone] Diarrhea and Nausea And Vomiting   Darvon [Propoxyphene] Other (See Comments)    Indigestion    Mexitil [Mexiletine] Nausea And Vomiting and Other (See Comments)    Tremors Difficulty walking Blurred vision   Phenergan [Promethazine Hcl] Other (See Comments)    Hyperactivity    Ranexa [Ranolazine Er] Diarrhea   Plavix [Clopidogrel Bisulfate] Rash    Patient Measurements: Height: 5\' 2"  (157.5 cm) Weight: 88.3 kg (194 lb 10.7 oz) IBW/kg (Calculated) : 50.1 HEPARIN DW (KG): 68.6   Vital Signs: Temp: 97.7 F (36.5 C) (03/13 1300) Temp Source: Core (03/13 1200) BP: 132/54 (03/13 1300) Pulse Rate: 60 (03/13 1315)  Labs: Recent Labs    06/01/23 0413 06/01/23 0422 06/01/23 0923 06/01/23 1614 06/02/23 0336 06/02/23 0442 06/02/23 0444 06/02/23 1329 06/02/23 1657 06/03/23 0453 06/03/23 0954  HGB 8.2*   < >  --    < > 8.1*  --  9.9*  --   --  8.4*  9.9*  --   HCT 24.8*   < >  --    < > 24.5*  --  29.0*  --   --  26.2*  29.0*  --   PLT 102*  --   --   --  111*  --   --   --   --  155  --   APTT 77*  --   --   --   --  68*  --   --   --  131* 107*  HEPARINUNFRC  --   --   --   --   --   --   --   --  0.26* 0.45 0.40  CREATININE 3.39*  --   --    < > 1.83*  --   --  1.42* 1.22* 1.05*  --   CKTOTAL  --   --  45  --   --   --   --   --   --   --   --    < > = values in this interval not displayed.    Estimated Creatinine Clearance: 47.1 mL/min (A) (by C-G formula based on SCr of 1.05 mg/dL (H)).   Medical History: Past Medical History:  Diagnosis Date   Acute kidney injury superimposed on chronic kidney disease (HCC) 10/02/2022   Allergy    Arthritis    Atrial fibrillation (HCC)    controlled with amiodarone, on coumadin   Chronic renal insufficiency    Chronic systolic  CHF (congestive heart failure) (HCC) 03/20/2013   Chronic systolic heart failure (HCC)    Coronary artery disease 01/31/2011   Diabetes mellitus    type 1   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    History of chicken pox    Hyperlipidemia    Hypertension    Ischemic cardiomyopathy    Knee MCL sprain 03/28/2014   Left knee pain 08/30/2012   Lumbar spondylosis 01/11/2012   Pes anserine bursitis 02/23/2014   Sleep apnea    Stroke [facility]) 2010   eye doctor said she had TIA   Suicide attempt by benzodiazepine overdose (HCC) 01/21/2020   UTI (urinary tract infection) 08/03/2013   Ventricular tachycardia (HCC)    Polymorphic    Medications:  Medications Prior to Admission  Medication Sig Dispense Refill Last Dose/Taking  amiodarone (PACERONE) 200 MG tablet Take 1 tablet (200 mg total) by mouth daily. 30 tablet 6 05/20/2023 Morning   aspirin EC 81 MG tablet Take 1 tablet (81 mg total) by mouth daily. Swallow whole. 30 tablet 12 05/20/2023 Morning   busPIRone (BUSPAR) 5 MG tablet Take 1 tablet (5 mg total) by mouth 2 (two) times daily. 60 tablet 1 05/20/2023 Morning   carvedilol (COREG) 25 MG tablet TAKE 1 TABLET (25 MG TOTAL) BY MOUTH TWICE A DAY WITH MEALS 180 tablet 3 05/20/2023 Morning   Cholecalciferol (VITAMIN D-3 PO) Take 1 tablet by mouth daily.   05/20/2023 Morning   dicyclomine (BENTYL) 10 MG capsule TAKE 1 CAPSULE (10 MG TOTAL) BY MOUTH 4 (FOUR) TIMES DAILY BEFORE MEALS AND AT BEDTIME. 120 capsule 1 05/20/2023 Morning   furosemide (LASIX) 20 MG tablet Take 2 tablets (40 mg) by mouth once a day. 60 tablet 11 05/20/2023 Morning   gabapentin (NEURONTIN) 100 MG capsule Take 200 mg by mouth 2 (two) times daily.   05/20/2023 Morning   insulin aspart (NOVOLOG FLEXPEN) 100 UNIT/ML FlexPen Inject up to 15 units daily under skin as advised (Patient taking differently: Inject 4-5 Units into the skin 3 (three) times daily before meals. Inject up to 15 units daily under skin as advised) 15 mL  11 Past Week   insulin degludec (TRESIBA FLEXTOUCH) 100 UNIT/ML FlexTouch Pen Inject 12-14 Units into the skin daily. (Patient taking differently: Inject 14 Units into the skin at bedtime.) 9 mL 3 05/19/2023 Bedtime   levETIRAcetam (KEPPRA XR) 500 MG 24 hr tablet Take 1 tablet (500 mg total) by mouth at bedtime. 90 tablet 3 05/19/2023 Bedtime   losartan (COZAAR) 25 MG tablet Take 25 mg by mouth daily.   05/20/2023 Morning   Multiple Vitamins-Minerals (EYE VITAMINS PO) Take 1 tablet by mouth 2 (two) times daily.   05/20/2023 Morning   nitroGLYCERIN (NITROSTAT) 0.4 MG SL tablet Place 1 tablet (0.4 mg total) under the tongue every 5 (five) minutes as needed for chest pain. 25 tablet 3 Past Month   omeprazole (PRILOSEC) 20 MG capsule Take 1 capsule by mouth daily.   05/20/2023 Morning   Rivaroxaban (XARELTO) 15 MG TABS tablet Take 1 tablet (15 mg total) by mouth daily with supper. (Patient taking differently: Take 15 mg by mouth at bedtime.) 30 tablet 6 05/19/2023 Bedtime   rosuvastatin (CRESTOR) 40 MG tablet Take 1 tablet (40 mg total) by mouth daily. NEEDS FOLLOW UP APPOINTMENT FOR MORE REFILLS (Patient taking differently: Take 40 mg by mouth at bedtime.) 90 tablet 0 05/19/2023 Bedtime   sertraline (ZOLOFT) 50 MG tablet Take 50 mg by mouth at bedtime.   05/19/2023 Bedtime   Continuous Glucose Sensor (FREESTYLE LIBRE 3 SENSOR) MISC 1 each by Does not apply route every 14 (fourteen) days. 6 each 3    magnesium oxide (MAG-OX) 400 (240 Mg) MG tablet TAKE 1 TABLET BY MOUTH TWICE A DAY (Patient not taking: Reported on 05/20/2023) 180 tablet 1 Not Taking   Scheduled:   amiodarone  200 mg Per Tube Daily   aspirin  81 mg Per Tube Daily   atorvastatin  80 mg Per Tube Daily   busPIRone  5 mg Per Tube BID   Chlorhexidine Gluconate Cloth  6 each Topical Daily   docusate  100 mg Per Tube BID   feeding supplement (PROSource TF20)  60 mL Per Tube BID   fiber supplement (BANATROL TF)  60 mL Per Tube BID   gabapentin  200  mg Per Tube Q12H   hydrocortisone sod succinate (SOLU-CORTEF) inj  100 mg Intravenous Q8H   insulin aspart  0-20 Units Subcutaneous Q4H   insulin aspart  10 Units Subcutaneous Q4H   insulin glargine  20 Units Subcutaneous BID   ipratropium-albuterol  3 mL Nebulization Q6H   midodrine  10 mg Per Tube Q8H   multivitamin  1 tablet Per Tube QHS   mouth rinse  15 mL Mouth Rinse Q2H   pantoprazole (PROTONIX) IV  40 mg Intravenous QHS   polyethylene glycol  17 g Per Tube BID   sennosides  10 mL Per Tube BID   sertraline  50 mg Per Tube QHS    Assessment: 78 yo female here with VT and ICD shock noted with, VDRF and AKI. 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 Previous concern for HIT, HIT antibody negative, bival change to heparin this morning.   Heparin level 0.40 therapeutic after increasing to 1100 units/hr. No pauses or issues with heparin infusion per RN. CBC ok.  Goal of Therapy:  Anti-Xa 0.3-0.7  Monitor platelets by anticoagulation protocol: Yes   Plan:  Continue heparin to 1100u/hr Daily heparin level, CBC Monitor s/s bleeding  F/u GOC  Trixie Rude, PharmD Clinical Pharmacist 06/03/2023  1:30 PM

## 2023-06-03 NOTE — Progress Notes (Addendum)
 Advanced Heart Failure Rounding Note  Cardiologist: [name], MD  Chief Complaint: Heart Failure  Subjective:    CVP 9-10. On CRRT. Net negative 2.5L. On NE 6. WBC up 18>23>19>25.  Rhythm jumping between Apacing and Vpacing with polymorphic PVCS.   Palliative meeting with son for goals of care discussion today.   Objective:    Weight Range: 88.3 kg Body mass index is 35.6 kg/m.   Vital Signs:   Temp:  [95.9 F (35.5 C)-98.6 F (37 C)] 98.2 F (36.8 C) (03/13 0815) Pulse Rate:  [54-87] 61 (03/13 0815) Resp:  [15-35] 32 (03/13 0815) BP: (66-159)/(40-97) 128/50 (03/13 0800) SpO2:  [91 %-100 %] 98 % (03/13 0815) Arterial Line BP: (62-174)/(27-60) 104/39 (03/13 0815) FiO2 (%):  [50 %-60 %] 50 % (03/13 0831) Weight:  [88.3 kg] 88.3 kg (03/13 0400) Last BM Date : 06/03/23  Weight change: Filed Weights   05/30/23 0600 06/02/23 0620 06/03/23 0400  Weight: 89.2 kg 91.2 kg 88.3 kg   Intake/Output:  Intake/Output Summary (Last 24 hours) at 06/03/2023 0902 Last data filed at 06/03/2023 0815 Gross per 24 hour  Intake 3497.39 ml  Output 5624 ml  Net -2126.61 ml    Physical Exam   CVP 9-10 General: Sedated  Cardiac: S1 and S2 present. No murmurs or rub. Extremities: Warm and dry. No rash, cyanosis. +2 edema. Unna boots  Neuro: Sedated/intubated  Telemetry   In and out of A-pacing and V-pacing, polymorphic PVCs HR in 60 (personally reviewed)  EKG    N/A   Labs    CBC Recent Labs    06/02/23 0336 06/02/23 0444 06/03/23 0453  WBC 19.2*  --  25.3*  HGB 8.1* 9.9* 8.4*  9.9*  HCT 24.5* 29.0* 26.2*  29.0*  MCV 86.3  --  88.5  PLT 111*  --  155   Basic Metabolic Panel Recent Labs    16/10/96 0336 06/02/23 0444 06/02/23 1657 06/03/23 0453  NA 136   < > 134* 134*  136  K 4.8   < > 4.4 4.2  4.3  CL 100   < > 102 103  CO2 23   < > 25 26  GLUCOSE 227*   < > 270* 166*  BUN 86*   < > 60* 44*  CREATININE 1.83*   < > 1.22* 1.05*  CALCIUM 7.6*   <  > 7.8* 8.0*  MG 2.3  --   --  2.5*  PHOS 4.8*  --  3.9 3.0   < > = values in this interval not displayed.   Liver Function Tests Recent Labs    06/02/23 1329 06/02/23 1657 06/03/23 0453  AST 19  --   --   ALT 24  --   --   ALKPHOS 61  --   --   BILITOT 1.1  --   --   PROT 6.0*  --   --   ALBUMIN 2.6* 2.6* 2.5*   No results for input(s): "LIPASE", "AMYLASE" in the last 72 hours. Cardiac Enzymes Recent Labs    06/01/23 0923  CKTOTAL 45   BNP: BNP (last 3 results) Recent Labs    03/12/23 1107 05/20/23 1223 05/31/23 0634  BNP 124.1* 2,548.1* 1,837.9*   Fasting Lipid Panel Recent Labs    06/01/23 0413  TRIG 36   Imaging   No results found.  Medications:    Scheduled Medications:  amiodarone  200 mg Per Tube Daily   aspirin  81 mg  Per Tube Daily   atorvastatin  80 mg Per Tube Daily   busPIRone  5 mg Per Tube BID   Chlorhexidine Gluconate Cloth  6 each Topical Daily   docusate  100 mg Per Tube BID   feeding supplement (PROSource TF20)  60 mL Per Tube BID   fiber supplement (BANATROL TF)  60 mL Per Tube BID   gabapentin  200 mg Per Tube Q12H   hydrocortisone sod succinate (SOLU-CORTEF) inj  100 mg Intravenous Q8H   insulin aspart  0-20 Units Subcutaneous Q4H   insulin aspart  10 Units Subcutaneous Q4H   insulin glargine  20 Units Subcutaneous BID   ipratropium-albuterol  3 mL Nebulization Q6H   midodrine  10 mg Per Tube Q8H   multivitamin  1 tablet Per Tube QHS   mouth rinse  15 mL Mouth Rinse Q2H   pantoprazole (PROTONIX) IV  40 mg Intravenous QHS   polyethylene glycol  17 g Per Tube BID   sennosides  10 mL Per Tube BID   sertraline  50 mg Per Tube QHS   Infusions:  feeding supplement (VITAL 1.5 CAL) 45 mL/hr at 06/03/23 0800   fentaNYL infusion INTRAVENOUS 50 mcg/hr (06/03/23 0800)   heparin 1,100 Units/hr (06/03/23 0800)   levETIRAcetam Stopped (06/02/23 2207)   micafungin (MYCAMINE) 150 mg in sodium chloride 0.9 % 100 mL IVPB Stopped (06/02/23  1053)   norepinephrine (LEVOPHED) Adult infusion 6 mcg/min (06/03/23 0800)   piperacillin-tazobactam Stopped (06/03/23 0533)   prismasol BGK 4/2.5 400 mL/hr at 06/02/23 1451   prismasol BGK 4/2.5 400 mL/hr at 06/02/23 1451   prismasol BGK 4/2.5 1,500 mL/hr at 06/03/23 0215   propofol (DIPRIVAN) infusion 30 mcg/kg/min (06/03/23 0800)   PRN Medications: acetaminophen (TYLENOL) oral liquid 160 mg/5 mL, bisacodyl, fentaNYL, hydrALAZINE, ipratropium-albuterol, midazolam, mouth rinse  Patient Profile   [name] is a 78 year old with a history of DMII,  CKD stage IV, A fib, VT, St Jude ICD, CAD, HLD, HTN, OSA, CVA, VT ablation 1/25, and HFrEF.    Admitted with VT x3 , Flu A , CAP, and shock.  Assessment/Plan   1. Mixed Shock Acute Hypoxic Respiratory Failure Aspiration PNA - Primarily septic/distributive. CAP/VT/Flu A. Lactic acid 5.2, now cleared. Placed on multiple pressors and antibiotics. SVR 581.  - Bcx 2/27 negative. BAL consistent with aspiration, no growth - WBC 18>23>19>25 - Intubated. Vent per CCM - On NE for BP support - On micafungin and zosyn per CCM   2. Acute on Chronic HFrEF - ICM  - Echo EF 40% with low normal RV function, weakly positive bubble study, IVC dilated.  - RHC (on NE 12): RA 14, PA 51/16 (33), PCWP 13, CO/CI (fick) 7.29/3.85, PVR 2.7W, PAPi 2.5, SVR 581 - CVP now 9-10 on CRRT for volume removal (failed diuresis) - UVO:ZDGU roughly 1:1 on cath. Would maintain CVP goal ~7-10 - Volume stable, UF 50/hr. - On NE for BP support, wean as able - continue UNNA boots   3. Thrombocytopenia - improving, 60>102>111>165 - likely in the setting of inflammatory process - PF4 negative. Bival>heparin - continue heparin gtt   4. PAF/flutter VT - Long VT history. Does not tolerate mexilitine. S/P VT ablation 1/25.  - PAF on admit. VT with ATP this admission. - tele as above. Device rep to interrogate today. - continue amio 200 mg daily - continue heparin gtt    5. AKI on CKD Stage IIIb - BUN/Cr up to 160/3.4 - Nephrology following.  Volume removal per CRRT started 3/11   6. CAD - Last cath in 7/24 with severe stenosis distal LCx managed medically, patent stents  - Elevated trops on admit. Demand ischemia in the setting of shock - continue asa + statin   8. GOC  - Palliative Care following. Remains Full code at this time  Family meeting with palliative today for GOC discussion.  Length of Stay: 13  CRITICAL CARE Performed by: [place] Lee  Total critical care time: 12 minutes  Critical care time was exclusive of separately billable procedures and treating other patients.  Critical care was necessary to treat or prevent imminent or life-threatening deterioration.  Critical care was time spent personally by me on the following activities: development of treatment plan with patient and/or surrogate as well as nursing, discussions with consultants, evaluation of patient's response to treatment, examination of patient, obtaining history from patient or surrogate, ordering and performing treatments and interventions, ordering and review of laboratory studies, ordering and review of radiographic studies, pulse oximetry and re-evaluation of patient's condition.  Length of Stay: 8  [place] Lee, NP  06/03/2023, 9:02 AM  Advanced Heart Failure Team Pager [phone] (M-F; 7a - 5p)  Please contact [facility] for night-coverage after hours (4p -7a ) and weekends on [email or web address]  Patient seen with NP, I formulated the plan and agree with the above note.   Ongoing CVVH, I/O net negative 2486 yesterday.  Pulling UF -50 cc/hr.  She remains on NE 8. She is on Zosyn/micafungin for PNA with septic shock, afebrile with WBCs 25.   A-paced currently, has had more PVCs.   General: Intubated Neck: No JVD, no thyromegaly or thyroid nodule.  Lungs: Decreased at bases.  CV: Nondisplaced PMI.  Heart regular S1/S2, no S3/S4, no murmur.  1+ ankle edema.    Abdomen: Soft, nontender, no hepatosplenomegaly, no distention.  Skin: Intact without lesions or rashes.  Neurologic: Sedate on vent.  Extremities: No clubbing or cyanosis.  HEENT: Normal.   1. Shock: Suspect primarily septic/distributive.  Not on inotropes.  Currently on NE 8.  RHC 3/11 with high cardiac output and low SVR at 581.   - Titrate NE as able to maintain MAP.  - Continue antibiotic coverage.  2. Acute on chronic systolic CHF:  Ischemic cardiomyopathy.  Echo this admission with EF 40% with low normal RV function, weakly positive bubble study, IVC dilated. This is comparable to the past.  AKI with attempt at diuresis, only 620 cc UOP on aggressive regimen yesterday.  RHC 3/11 showed primarily RV failure with RA 14, PCWP 13.  Cardiac output high with primarily pulmonary venous hypertension.  She remains on NE 8 to maintain MAP. Given minimal response to diuretics with worsening creatinine, CVVH started on 3/11.  CVP 8-9 today, pulling UF net negative 50 cc/hr.  - With sepsis physiology and primarily RV failure (CVP 8-9 today), would be gentle with UF, aim for net 50 cc/hr for now.   - She does not require inotropes.  3. Acute hypoxemic respiratory failure: Influenza A and likely secondary bacterial PNA with possible aspiration and now ARDS picture.  Septic shock picture on RHC.  - Continue Zosyn and micafungin per CCM.  4. AKI on CKD stage 3: Creatinine baseline around 2.  Now suspect ATN with AKI in setting of shock. BUN/creatinine 146/3.44 => 159/3.39 => started CVVH on 3/11.  - Continue CVVH as above, aim for UF net 50 cc/hr today.  5. Atrial fibrillation/flutter: Paroxysmal.  In NSR.  -  Continue amiodarone po.  - HIT negative, back on heparin gtt.  6. Thrombocytopenia: plts 76 => 102 => 111 => 155. HIT negative, suspect inflammatory.  7. CAD: Last cath in 7/24 with severe stenosis distal LCx managed medically, patent stents. No ACS this admission.  - Continue statin.  8. VT: Long  history of this.  Does not tolerate mexiletine.  VT ablation 1/25.  Had VT at admission with ATP. No further VT overnight but more PVCs.  - Continue amiodarone po.  9. GOC: Not making progress towards extubation. Family meeting today for goals of care => DNR, possible comfort transition tomorrow.   CRITICAL CARE Performed by: [name]  Total critical care time: 35 minutes  Critical care time was exclusive of separately billable procedures and treating other patients.  Critical care was necessary to treat or prevent imminent or life-threatening deterioration.  Critical care was time spent personally by me on the following activities: development of treatment plan with patient and/or surrogate as well as nursing, discussions with consultants, evaluation of patient's response to treatment, examination of patient, obtaining history from patient or surrogate, ordering and performing treatments and interventions, ordering and review of laboratory studies, ordering and review of radiographic studies, pulse oximetry and re-evaluation of patient's condition.  [name] 06/03/2023 12:47 PM

## 2023-06-03 NOTE — IPAL (Signed)
  Interdisciplinary Goals of Care Family Meeting   Date carried out: 06/03/2023  Location of the meeting: Conference room  Member's involved: Family Member or next of kin and Palliative care team member  Durable Power of Attorney or acting medical decision maker: son, [name]    Discussion: We discussed goals of care for [name] .  Discussed that overall she is not improving. Her heart is very weak, and she now has concomitant renal failure requiring CRRT. Her renal function is not improving and continues to be anuric-oliguric. Today, she has had significant more ectopy despite our interventions. Her mentation precludes attempts at extubation. Overall, she is end of life. [name] feels that it is appropriate to make her DNR at this time and will not perform chest compressions if she were to arrest. He does wish for [name] to continue all other measures until his sister is able to visit this evening and then can begin transition to comfort care.   Code status:   Code Status: Do not attempt resuscitation (DNR) PRE-ARREST INTERVENTIONS DESIRED   Disposition: Continue current acute care  Time spent for the meeting: 30   Cristopher Peru, PA-C [facility] 06/03/23 11:03 AM  Please see [email or web address] for pager details.  From 7A-7P if no response, please call [phone] After hours, please call ELink [phone]

## 2023-06-03 NOTE — Progress Notes (Signed)
 Palliative Medicine Progress Note   Patient Name: [name]       Date: 06/03/2023 DOB: August 28, 1945  Age: 78 y.o. MRN#: [id number] Attending Physician: Lorin Glass, MD Primary Care Physician: Aviva Kluver Admit Date: 05/20/2023   HPI/Patient Profile: 78 y.o. female  with past medical history of chronic HFrEF, status post ICD, diabetes, neuropathy, CAD, CKD stage IIIb, atrial fibrillation, seizure disorder, ventricular fibrillation, hypertension, and hyperlipidemia.  She presented to the ED on 05/20/2023 feeling unwell for several days and reporting her ICD fired.  She has found to have influenza A and pneumonia. She was intubated in the ED due to increased work of breathing prompting intubation. She is admitted with septic shock.    Significant Hospital Events: 3/3 - extubated to BiPAP  3/5 - increased WOB, CXR still with marked bilateral airspace disease. Widened antibiotic coverage 3/6 - did not tolerate trial of HHFNC 3/7 - worsening renal function, still BiPAP dependent 3/8 - palliative consult, GOC with family requesting to continue full scope care 3/9 - reintubated 3/11 - RHC, started CRRT   Subjective: Chart reviewed including labs, vital signs, imaging, and progress notes. Update received from RN. Patient remains intubated, on CRRT, and on vasopressor support. She has been having runs of V-tach this morning.   I met with son/Brian, granddaughter/Vanessa in the [facility] conference room. Daughter/Lisa was on the phone.   We discussed that patient remains critically ill in multi-organ failure.  Discussed that her condition has not improved despite ongoing aggressive medical interventions.  Discussed the limitations of medical interventions to prolong quality of life when the body is  failing.  The difference between full scope medical intervention and comfort care was considered, keeping in mind the concept of quality of life. We discussed transitioning to comfort care and  de-escalating full scope medical interventions, allowing a natural course to occur.    Discussed the concept of liberalization from the ventilator, while using opioids and other medications to prevent pain and air hunger at end-of-life.   Family is understandably emotional and tearful.  They express not wanting patient to suffer.  They are agreeable to DNR in the event of cardiac arrest, but want to continue all other full scope interventions for now, while allowing family time to visit.  Daughter/Lisa is traveling to [place] from [place].  Emotional support provided. Questions/concerns were addressed.   Objective:  Physical Exam Vitals reviewed.  Constitutional:      General: She is not in acute distress.    Interventions: She is sedated.     Comments: Critically ill-appearing anasarca  Cardiovascular:     Comments: Irregular rhythm Pulmonary:     Comments: Intubated Neurological:     Comments: Not following commands             Palliative Medicine Assessment & Plan   Assessment: Principal Problem:   Acute respiratory failure with hypoxia (HCC) Active Problems:   Implantable cardioverter-defibrillator (ICD) in situ   PAF (paroxysmal atrial fibrillation) (HCC)   Coronary artery disease   HTN (hypertension)   Obesity, unspecified   Chronic systolic CHF (congestive heart failure) (HCC)   Lower back pain   Spinal stenosis of lumbar region   Diabetic peripheral neuropathy associated with type 2 diabetes mellitus (HCC)   Hyperlipidemia   Hypothyroidism   DM (diabetes mellitus), type 2 with renal complications (HCC)   VT (ventricular tachycardia) (HCC)   Acute renal failure superimposed on stage 3b chronic kidney disease (HCC)   Seizure disorder (HCC)   Ventricular  fibrillation (HCC)   Gastroesophageal reflux disease without esophagitis   History of cerebrovascular accident   Influenza A    Recommendations/Plan: Code status changed to DNR  Continue all interventions for now, allowing time for family to arrive and visit Transition to comfort measures when family is ready PMT will be available for support as needed  Code Status: DNR - interventions  Prognosis:  Very guarded, hours-days if transitioned to comfort care   Care plan was discussed with Dr. Katrinka Blazing, Mertha Baars, PA, and 2H nursing staff  Thank you for allowing the Palliative Medicine Team to assist in the care of this patient.   Time: 65 minutes  Detailed review of medical records (labs, imaging, vital signs), medically appropriate exam, discussed with treatment team, counseling and education to patient, family, & staff, documenting clinical information, coordination of care.   Merry Proud, NP   Please contact Palliative Medicine Team phone at [phone] for questions and concerns.  For individual providers, please see AMION.

## 2023-06-03 NOTE — Progress Notes (Addendum)
 NAME:  [name], MRN:  [id number], DOB:  [birth year], LOS: 13 ADMISSION DATE:  05/20/2023, CONSULTATION DATE:  05/20/2023 REFERRING MD:  Alinda Money - [facility] CHIEF COMPLAINT:  N/V/Abd pain / Resp failure   History of Present Illness:  78 year old female with medical history significant of hypertension, hyperlipidemia, diabetes, neuropathy, hypothyroidism, CKD 3B, CAD, GERD, CVA, chronic systolic CHF, depression, anxiety, ventricular tachycardia, ventricular fibrillation, atrial fibrillation, status post ICD, obesity, seizure disorder, low back pain, spinal stenosis presenting after feeling unwell for several days and recent ICD discharges.   Patient reports that she has been unwell for possible days.  She is experience nausea vomiting and abdominal pain.  She reports that her defibrillator fired multiple times today.  ED provider confirmed this with the [facility] representative who noted that the device did fire 3 times a day for ventricular fibrillation.  Also noted periods of slow ventricular tachycardia as well as atrial fibrillation.   Patient denies fevers, chills.   ED Course: Vital signs in the ED notable for blood pressure initially in the 70 systolic now improved to the 120s.  Respiratory rate in the 20s.  Requiring 3 L to maintain saturations.  Lab workup included CMP with potassium 3.2, bicarb 18, gap 17, BUN 32, creatinine elevated 2.37 baseline of 2, glucose 2 1, calcium 7.9, protein 6.0, albumin 2.5, T. bili 1.9, AST 1076, ALT 663.  CBC with platelets 142.  BNP elevated to 2548.  Troponin trend 339, 417.  Lipase normal.  Respiratory panel positive for flu A.  Lactic acid pending.  Urinalysis pending.  Blood cultures pending.  VBG with normal pH pCO2 40.7.  Magnesium pending.  Chest x-ray with new bilateral opacities consistent with pneumonia.  CT of the abdomen pelvis showing no acute normality in the abdomen pelvis, bilateral opacity at the lung bases, left renal cysts.  Patient received  ceftriaxone, doxycycline, Tamiflu in the ED.  Started on amiodarone infusion.  Also received morphine, 1 L IV fluids and 40 mEq p.o. potassium x 2.  Cardiology consulted and recommended amiodarone fusion, repletion of magnesium and potassium as needed, adding back beta-blocker as blood pressure allows.  Pertinent Medical History:   has a past medical history of Acute kidney injury superimposed on chronic kidney disease (HCC) (10/02/2022), Allergy, Arthritis, Atrial fibrillation (HCC), Chronic renal insufficiency, Chronic systolic CHF (congestive heart failure) (HCC) (03/20/2013), Chronic systolic heart failure (HCC), Coronary artery disease (01/31/2011), Diabetes mellitus, 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, History of chicken pox, Hyperlipidemia, Hypertension, Ischemic cardiomyopathy, Knee MCL sprain (03/28/2014), Left knee pain (08/30/2012), Lumbar spondylosis (01/11/2012), Pes anserine bursitis (02/23/2014), Sleep apnea, Stroke (HCC) (2010), Suicide attempt by benzodiazepine overdose (HCC) (01/21/2020), UTI (urinary tract infection) (08/03/2013), and Ventricular tachycardia (HCC).   Significant Hospital Events: Including procedures, antibiotic start and stop dates in addition to other pertinent events   2/27 Presented to ED feeling unwell, s/p AICD discharge x 3 for VT/slow VT. Hypotensive with increased WOB prompting intubation. Flu A+,  CXR c/w PNA. 2/28 Remains on high-dose vasopressor support with Levophed. No more episodes of V. Tach. Remain acidotic mixed metabolic and respiratory. 3/1 Remains on ventilator with FiO2 50 PEEP 8, remains on both levo and vaso 3/3 extubated to bipap 3/4 treated w/ lasix still on NIPPV 3/5 inc'd WOB. CXR still w/ marked bilateral airspace disease. ABG w/ sig hypoxia and resp alk. PEEP inc'd on NIPPV. Increased lasix. Widened ABX coverage due to clinical decline  3/6 attempting short rotations of [facility]NC but still w/  marked WOB.  Goals of care discussion  with family they continued to request aggressive care.  Shared with them my concern about limiting factor would be renal function, given underlying cardiomyopathy she would be a poor candidate for dialysis 3/7 renal function a little worse CVP 6, using albumin and Lasix to try to mobilize total body overload still BiPAP dependent 3/9 reintubated, pan-CT 3/10 AHF consult 3/11 RHC, renal consult 3/13: overall no significant improvement. Met with palliative and decision for DNR with likely comfort care tonight once family has been able to see her   Interim History / Subjective:  No acute events overnight. Con't CRRT. No improvements. More ectopy this morning. Met with palliative and decision for DNR now and likely comfort this evening once patient's daughter can get here and visit.   Objective:  Blood pressure (!) 113/46, pulse 60, temperature 97.6 F (36.4 C), temperature source Axillary, resp. rate 17, height 5\' 2"  (1.575 m), weight 88.3 kg, SpO2 92%. CVP:  [5 mmHg-13 mmHg] 12 mmHg  Vent Mode: PCV FiO2 (%):  [50 %-60 %] 50 % Set Rate:  [32 bmp] 32 bmp PEEP:  [10 cmH20] 10 cmH20 Plateau Pressure:  [25 cmH20-26 cmH20] 25 cmH20   Intake/Output Summary (Last 24 hours) at 06/03/2023 1146 Last data filed at 06/03/2023 1129 Gross per 24 hour  Intake 3391.8 ml  Output 5574.3 ml  Net -2182.5 ml   Filed Weights   05/30/23 0600 06/02/23 0620 06/03/23 0400  Weight: 89.2 kg 91.2 kg 88.3 kg   Physical Examination: General: older female, laying in bed, critically ill, Intubated, sedated HEENT: NCAT, anicteric sclera, ett, ogt  Neuro: RASS -4, does not follow commands  Abdomen: rounded, soft, non distended  Lungs: vented, synchronous, coarse lung sounds  Skin: warm and dry, edematous   Resolved Hospital Problem List:  Septic shock Shock liver, improved  Assessment & Plan:  Acute respiratory failure with hypoxia and hypercapnia- odd case; flu ARDS was 10 days ago; lingering issues thought to  be some combination fluid overload and HCAP but both have been treated.  Failed extubation trial and now workup shows (A) normal ESR so doubtful COP/Amio lung (B) normal Pct and (C) persistent upper lobe GGO/crazing paving, (D) elevated CVP but okay coox.  RHC today c/w septic state with high CO.   AKI on CKD4- stubborn to diuresis Worsening thrombocytopenia- duplex neg; bival start 3/9 and HIT sent but still pretty low suspicion; now improved DM2 with hyperglycemia- improved HFrEF, Afib/flutter on xarelto, hx of VT w/ ICD in place- recent ablation Jan 2025; to restart amio given normal ESR Muscular deconditioning Multiple pressure ulcers- developed over past week  Taken together, we are dealing with persistent vasoplegic state, ARDS type pattern CXR with negative inflammatory markers, minimally elevated wedge, and negative infectious workup to date.  We also have high dead space unable to get pH better than around 7.2 despite aggressive vent measures.  Running out of options here so with throw everything at this: - DNR-Limited now. Prelim plan for comfort tonight once daughter is able to get here. Met with son, Arlys John today with palliative and decision per IPAL note. In the interim we are continuing current care with vent/crrt/pressors until visitation.  - con't zosyn, micafungin (BAL with ngtd) - stress steroids with solu-cortef 100mg  q8h  - con't midodrine, norepinephrine for BP support  - decrease CRRT UF to 50/hr as intravascularly dry despite still having edema  - aggressive vent to keep pH >7.1  - changing  bivalarudin back to heparin per HF (HIT ab negative)  - increased lantus for hyperglycemia  - having ectopy on EKG, heart failure has seen EKGs and will interrogate underlying rhythm. Looks like back up v-pacing on intermittently.    Best Practice (right click and "Reselect all SmartList Selections" daily)   Diet/type: TF DVT prophylaxis systemic heparin Pressure ulcer(s): N/A GI  prophylaxis: PPI Lines: right internal jugular CVL - 05/20/23, left femoral HD cath  Foley: Yes needed Code Status:  limited Last date of multidisciplinary goals of care discussion: see IPAL. DNR-limited. Possible comfort this evening 3/13  Critical care time: 40 min   Cristopher Peru, PA-C  Pulmonary & Critical Care 06/03/23 11:46 AM  Please see [email or web address] for pager details.  From 7A-7P if no response, please call [phone] After hours, please call ELink [phone]

## 2023-06-03 NOTE — Progress Notes (Signed)
 [name] NEPHROLOGY PROGRESS NOTE  Assessment/ Plan: Pt is a 78 y.o. yo female  with past medical history significant for hypertension, DM, A-fib, CAD, H LD, stroke, CHF, CKD 3B-4 who was admitted with V. tach, influenza A positive, septic and cardiac shock and pneumonia, consulted for AKI on CKD.   # Acute kidney injury on CKD 3b-4 due to ATN: Minimal response with IV diuretics.  RHC with right heart failure.  Started CRRT on 3/11 after placement of HD catheter.   Continue current CRRT prescription. All 4K bath, UF goal 50-100 mL/per hour, heparin for anticoagulation.   #Acute hypoxic respiratory failure due to influenza A, secondary bacterial pneumonia, possible ARDS, septic shock: On antibiotics and pressor per ICU team.   #Acute on chronic systolic CHF initially treated with diuretics and now volume management with CRRT.  Heart failure team is following.   #Thrombocytopenia, HIT negative,  Angiomax discontinued and transitioned back to heparin.  Subjective: Seen and examined in ICU.  Tolerating CRRT well.  On 6 mics of Levophed.  UF around 100 cc an hour.  No other new event.  Discussed with ICU team. Objective Vital signs in last 24 hours: Vitals:   06/03/23 0730 06/03/23 0745 06/03/23 0800 06/03/23 0815  BP:   (!) 128/50   Pulse: 61 61 65 61  Resp: (!) 32 (!) 32 (!) 32 (!) 32  Temp: 98.4 F (36.9 C) 98.4 F (36.9 C) 98.2 F (36.8 C) 98.2 F (36.8 C)  TempSrc:      SpO2: 97% 96% 98% 98%  Weight:      Height:       Weight change: -2.9 kg  Intake/Output Summary (Last 24 hours) at 06/03/2023 0856 Last data filed at 06/03/2023 0815 Gross per 24 hour  Intake 3592.05 ml  Output 5855 ml  Net -2262.95 ml       Labs: RENAL PANEL Recent Labs  Lab 05/28/23 0422 05/28/23 1109 05/30/23 0316 05/30/23 0330 05/31/23 0427 05/31/23 7829 06/01/23 0413 06/01/23 0422 06/01/23 1614 06/02/23 0325 06/02/23 0336 06/02/23 0444 06/02/23 1329 06/02/23 1657  06/03/23 0453  NA 147*   < > 144   < > 140   < > 144   < > 136   < > 136 137 137 134* 134*  136  K 3.6   < > 3.8   < > 4.4   < > 4.3   < > 4.9   < > 4.8 4.8 4.6 4.4 4.2  4.3  CL 112*   < > 109   < > 110   < > 106  --  100  --  100  --  105 102 103  CO2 20*   < > 19*   < > 19*   < > 21*  --  21*  --  23  --  21* 25 26  GLUCOSE 313*   < > 94   < > 238*   < > 159*  --  249*  --  227*  --  248* 270* 166*  BUN 93*   < > 120*   < > 136*   < > 159*  --  143*  --  86*  --  62* 60* 44*  CREATININE 2.39*   < > 2.60*   < > 3.02*   < > 3.39*  --  3.00*  --  1.83*  --  1.42* 1.22* 1.05*  CALCIUM 8.2*   < > 8.7*   < >  7.9*   < > 8.0*  --  7.6*  --  7.6*  --  7.9* 7.8* 8.0*  MG 2.4   < > 2.0  --  2.0  --  2.1  --   --   --  2.3  --   --   --  2.5*  PHOS 4.5   < > 5.7*  --  7.6*  --  8.1*  --  7.0*  --  4.8*  --   --  3.9 3.0  ALBUMIN 1.8*  --   --   --   --   --   --   --  2.5*  --   --   --  2.6* 2.6* 2.5*   < > = values in this interval not displayed.    Liver Function Tests: Recent Labs  Lab 05/28/23 0422 06/01/23 1614 06/02/23 1329 06/02/23 1657 06/03/23 0453  AST 18  --  19  --   --   ALT 86*  --  24  --   --   ALKPHOS 84  --  61  --   --   BILITOT 1.1  --  1.1  --   --   PROT 5.3*  --  6.0*  --   --   ALBUMIN 1.8*   < > 2.6* 2.6* 2.5*   < > = values in this interval not displayed.   No results for input(s): "LIPASE", "AMYLASE" in the last 168 hours. Recent Labs  Lab 05/30/23 0316  AMMONIA 18   CBC: Recent Labs    06/01/23 0831 06/01/23 0923 06/02/23 0325 06/02/23 0336 06/02/23 0444 06/02/23 1140 06/03/23 0453  HGB 9.5*  --  9.5* 8.1* 9.9*  --  8.4*  9.9*  MCV  --   --   --  86.3  --   --  88.5  FERRITIN  --  112  --   --   --  168  --     Cardiac Enzymes: Recent Labs  Lab 06/01/23 0923  CKTOTAL 45   CBG: Recent Labs  Lab 06/02/23 1144 06/02/23 1656 06/02/23 1958 06/03/23 0029 06/03/23 0451  GLUCAP 226* 239* 199* 162* 159*    Iron Studies:  Recent Labs     06/02/23 1140  FERRITIN 168   Studies/Results: DG Chest Port 1 View Result Date: 06/02/2023 CLINICAL DATA:  Evaluate central venous catheter placement EXAM: PORTABLE CHEST 1 VIEW COMPARISON:  06/01/2023 FINDINGS: Unchanged positioning of the ETT, right internal jugular catheter, and enteric tube. Left chest wall ICD noted with leads in the right atrial appendage and right ventricle. Stable cardiomediastinal contours. Diffuse interstitial opacities noted throughout the right upper lobe and left lung with persistent consolidative changes in the left lower lung. No new findings. IMPRESSION: 1. Stable support apparatus. 2. No change in aeration to the lungs compared with previous exam. Electronically Signed   By: Signa Kell M.D.   On: 06/02/2023 05:53   DG Chest Port 1 View Result Date: 06/01/2023 CLINICAL DATA:  Central line placement. EXAM: PORTABLE CHEST 1 VIEW COMPARISON:  X-ray earlier 06/01/2023 and older FINDINGS: Stable ET tube and feeding tube. Left upper chest defibrillator as well. Right IJ line with tip overlying the upper right atrium near the SVC junction. Underinflation with enlarged cardiopericardial silhouette. Calcified aorta. Hazy interstitial changes again seen in both lungs diffusely. Less severe at the right lung base. Similar distribution. Apical pleural thickening. No pneumothorax or effusion. Osteopenia with degenerative changes. Overlapping cardiac  leads. IMPRESSION: No significant interval change when adjusted for technique. Electronically Signed   By: Karen Kays M.D.   On: 06/01/2023 15:49    Medications: Infusions:  feeding supplement (VITAL 1.5 CAL) 45 mL/hr at 06/03/23 0800   fentaNYL infusion INTRAVENOUS 50 mcg/hr (06/03/23 0800)   heparin 1,100 Units/hr (06/03/23 0800)   levETIRAcetam Stopped (06/02/23 2207)   micafungin (MYCAMINE) 150 mg in sodium chloride 0.9 % 100 mL IVPB Stopped (06/02/23 1053)   norepinephrine (LEVOPHED) Adult infusion 6 mcg/min  (06/03/23 0800)   piperacillin-tazobactam Stopped (06/03/23 0533)   prismasol BGK 4/2.5 400 mL/hr at 06/02/23 1451   prismasol BGK 4/2.5 400 mL/hr at 06/02/23 1451   prismasol BGK 4/2.5 1,500 mL/hr at 06/03/23 0215   propofol (DIPRIVAN) infusion 30 mcg/kg/min (06/03/23 0800)    Scheduled Medications:  amiodarone  200 mg Per Tube Daily   aspirin  81 mg Per Tube Daily   atorvastatin  80 mg Per Tube Daily   busPIRone  5 mg Per Tube BID   Chlorhexidine Gluconate Cloth  6 each Topical Daily   docusate  100 mg Per Tube BID   feeding supplement (PROSource TF20)  60 mL Per Tube BID   fiber supplement (BANATROL TF)  60 mL Per Tube BID   gabapentin  200 mg Per Tube Q12H   hydrocortisone sod succinate (SOLU-CORTEF) inj  100 mg Intravenous Q8H   insulin aspart  0-20 Units Subcutaneous Q4H   insulin aspart  10 Units Subcutaneous Q4H   insulin glargine  20 Units Subcutaneous BID   ipratropium-albuterol  3 mL Nebulization Q6H   midodrine  10 mg Per Tube Q8H   multivitamin  1 tablet Per Tube QHS   mouth rinse  15 mL Mouth Rinse Q2H   pantoprazole (PROTONIX) IV  40 mg Intravenous QHS   polyethylene glycol  17 g Per Tube BID   sennosides  10 mL Per Tube BID   sertraline  50 mg Per Tube QHS    have reviewed scheduled and prn medications.  Physical Exam: General: Ill looking female, intubated, sedated Heart:RRR, s1s2 nl Lungs: Coarse breath sound bilateral. Abdomen:soft, non-distended Extremities: Trace dependent edema Dialysis Access: Left femoral line placed by ICU on 3/11.  Lahari Suttles Prasad Adel Burch 06/03/2023,8:56 AM  LOS: 13 days

## 2023-06-04 DIAGNOSIS — Z515 Encounter for palliative care: Secondary | ICD-10-CM | POA: Diagnosis not present

## 2023-06-04 DIAGNOSIS — N179 Acute kidney failure, unspecified: Secondary | ICD-10-CM | POA: Diagnosis not present

## 2023-06-04 DIAGNOSIS — Z66 Do not resuscitate: Secondary | ICD-10-CM

## 2023-06-04 DIAGNOSIS — N184 Chronic kidney disease, stage 4 (severe): Secondary | ICD-10-CM | POA: Diagnosis not present

## 2023-06-04 DIAGNOSIS — Z7189 Other specified counseling: Secondary | ICD-10-CM | POA: Diagnosis not present

## 2023-06-04 DIAGNOSIS — R570 Cardiogenic shock: Secondary | ICD-10-CM | POA: Diagnosis not present

## 2023-06-04 DIAGNOSIS — J9601 Acute respiratory failure with hypoxia: Secondary | ICD-10-CM | POA: Diagnosis not present

## 2023-06-04 DIAGNOSIS — J9602 Acute respiratory failure with hypercapnia: Secondary | ICD-10-CM | POA: Diagnosis not present

## 2023-06-04 DIAGNOSIS — I5023 Acute on chronic systolic (congestive) heart failure: Secondary | ICD-10-CM | POA: Diagnosis not present

## 2023-06-04 LAB — POCT I-STAT 7, (LYTES, BLD GAS, ICA,H+H)
Acid-base deficit: 1 mmol/L (ref 0.0–2.0)
Acid-base deficit: 2 mmol/L (ref 0.0–2.0)
Acid-base deficit: 2 mmol/L (ref 0.0–2.0)
Bicarbonate: 27.2 mmol/L (ref 20.0–28.0)
Bicarbonate: 26.9 mmol/L (ref 20.0–28.0)
Bicarbonate: 26 mmol/L (ref 20.0–28.0)
Calcium, Ion: 1.18 mmol/L (ref 1.15–1.40)
Calcium, Ion: 1.18 mmol/L (ref 1.15–1.40)
Calcium, Ion: 1.18 mmol/L (ref 1.15–1.40)
HCT: 27 % — ABNORMAL LOW (ref 36.0–46.0)
HCT: 28 % — ABNORMAL LOW (ref 36.0–46.0)
HCT: 26 % — ABNORMAL LOW (ref 36.0–46.0)
Hemoglobin: 9.2 g/dL — ABNORMAL LOW (ref 12.0–15.0)
Hemoglobin: 9.5 g/dL — ABNORMAL LOW (ref 12.0–15.0)
Hemoglobin: 8.8 g/dL — ABNORMAL LOW (ref 12.0–15.0)
O2 Saturation: 88 %
O2 Saturation: 98 %
O2 Saturation: 94 %
Patient temperature: 36.9
Patient temperature: 36.9
Patient temperature: 36.7
Potassium: 4.5 mmol/L (ref 3.5–5.1)
Potassium: 4.4 mmol/L (ref 3.5–5.1)
Potassium: 4.4 mmol/L (ref 3.5–5.1)
Sodium: 135 mmol/L (ref 135–145)
Sodium: 135 mmol/L (ref 135–145)
Sodium: 136 mmol/L (ref 135–145)
TCO2: 29 mmol/L (ref 22–32)
TCO2: 29 mmol/L (ref 22–32)
TCO2: 28 mmol/L (ref 22–32)
pCO2 arterial: 67.5 mmHg (ref 32–48)
pCO2 arterial: 69.4 mmHg (ref 32–48)
pCO2 arterial: 62.2 mmHg — ABNORMAL HIGH (ref 32–48)
pH, Arterial: 7.212 — ABNORMAL LOW (ref 7.35–7.45)
pH, Arterial: 7.196 — CL (ref 7.35–7.45)
pH, Arterial: 7.228 — ABNORMAL LOW (ref 7.35–7.45)
pO2, Arterial: 67 mmHg — ABNORMAL LOW (ref 83–108)
pO2, Arterial: 134 mmHg — ABNORMAL HIGH (ref 83–108)
pO2, Arterial: 83 mmHg (ref 83–108)

## 2023-06-04 LAB — CBC
HCT: 25.5 % — ABNORMAL LOW (ref 36.0–46.0)
Hemoglobin: 8 g/dL — ABNORMAL LOW (ref 12.0–15.0)
MCH: 28.9 pg (ref 26.0–34.0)
MCHC: 31.4 g/dL (ref 30.0–36.0)
MCV: 92.1 fL (ref 80.0–100.0)
Platelets: 177 10*3/uL (ref 150–400)
RBC: 2.77 MIL/uL — ABNORMAL LOW (ref 3.87–5.11)
RDW: 22 % — ABNORMAL HIGH (ref 11.5–15.5)
WBC: 32.8 10*3/uL — ABNORMAL HIGH (ref 4.0–10.5)
nRBC: 3.8 % — ABNORMAL HIGH (ref 0.0–0.2)

## 2023-06-04 LAB — RENAL FUNCTION PANEL
Albumin: 2.3 g/dL — ABNORMAL LOW (ref 3.5–5.0)
Anion gap: 6 (ref 5–15)
BUN: 29 mg/dL — ABNORMAL HIGH (ref 8–23)
CO2: 24 mmol/L (ref 22–32)
Calcium: 7.7 mg/dL — ABNORMAL LOW (ref 8.9–10.3)
Chloride: 102 mmol/L (ref 98–111)
Creatinine, Ser: 0.94 mg/dL (ref 0.44–1.00)
GFR, Estimated: >60 mL/min (ref >60)
Glucose, Bld: 164 mg/dL — ABNORMAL HIGH (ref 70–99)
Phosphorus: 3.2 mg/dL (ref 2.5–4.6)
Potassium: 4.4 mmol/L (ref 3.5–5.1)
Sodium: 132 mmol/L — ABNORMAL LOW (ref 135–145)

## 2023-06-04 LAB — HEPARIN LEVEL (UNFRACTIONATED): Heparin Unfractionated: 0.67 [IU]/mL (ref 0.30–0.70)

## 2023-06-04 LAB — GLUCOSE, CAPILLARY
Glucose-Capillary: 141 mg/dL — ABNORMAL HIGH (ref 70–99)
Glucose-Capillary: 154 mg/dL — ABNORMAL HIGH (ref 70–99)
Glucose-Capillary: 213 mg/dL — ABNORMAL HIGH (ref 70–99)
Glucose-Capillary: 155 mg/dL — ABNORMAL HIGH (ref 70–99)
Glucose-Capillary: 134 mg/dL — ABNORMAL HIGH (ref 70–99)

## 2023-06-04 LAB — COOXEMETRY PANEL
Carboxyhemoglobin: 2.1 % — ABNORMAL HIGH (ref 0.5–1.5)
Methemoglobin: <0.7 % (ref 0.0–1.5)
O2 Saturation: 78.4 %
Total hemoglobin: 7.8 g/dL — ABNORMAL LOW (ref 12.0–16.0)

## 2023-06-04 LAB — TRIGLYCERIDES: Triglycerides: 98 mg/dL (ref <150)

## 2023-06-04 LAB — ANTI-SCLERODERMA ANTIBODY: Scleroderma (Scl-70) (ENA) Antibody, IgG: <0.2 AI (ref 0.0–0.9)

## 2023-06-04 LAB — MAGNESIUM: Magnesium: 2.6 mg/dL — ABNORMAL HIGH (ref 1.7–2.4)

## 2023-06-04 MED ORDER — GLYCOPYRROLATE 0.2 MG/ML IJ SOLN: 0.2000 mg | INTRAMUSCULAR | Status: DC | PRN

## 2023-06-04 MED ORDER — POLYVINYL ALCOHOL 1.4 % OP SOLN: 1.0000 [drp] | Freq: Four times a day (QID) | OPHTHALMIC | Status: DC | PRN

## 2023-06-04 MED ORDER — MIDAZOLAM-SODIUM CHLORIDE 100-0.9 MG/100ML-% IV SOLN
0.5000 mg/h | INTRAVENOUS | Status: DC
  Administered 2023-06-04: 0.5 mg/h via INTRAVENOUS
  Filled 2023-06-04: qty 100

## 2023-06-04 MED ORDER — HYDROMORPHONE HCL-NACL 50-0.9 MG/50ML-% IV SOLN
0.0000 mg/h | INTRAVENOUS | Status: DC
  Administered 2023-06-04: 1 mg/h via INTRAVENOUS
  Filled 2023-06-04: qty 50

## 2023-06-04 MED ORDER — GLYCOPYRROLATE 1 MG PO TABS: 1.0000 mg | ORAL_TABLET | ORAL | Status: DC | PRN

## 2023-06-08 ENCOUNTER — Ambulatory Visit: Payer: Medicare PPO | Admitting: Pulmonary Disease

## 2023-06-08 LAB — ANTINUCLEAR ANTIBODIES, IFA: ANA Ab, IFA: NEGATIVE

## 2023-06-10 ENCOUNTER — Ambulatory Visit: Payer: Medicare PPO

## 2023-06-22 NOTE — Progress Notes (Addendum)
 Critical ABG given to Elink via phone. Changes made to ventilator per Elink.

## 2023-06-22 NOTE — Progress Notes (Signed)
 Daily Progress Note   Patient Name: [name]       Date: 05/22/2023 DOB: 22-Oct-1945  Age: 78 y.o. MRN#: [id number] Attending Physician: Lorin Glass, MD Primary Care Physician: Pcp, No Admit Date: 05/20/2023 Length of Stay: 14 days  Reason for Consultation/Follow-up: Establishing goals of care  HPI/Patient Profile:  78 y.o. female  with past medical history of chronic HFrEF, status post ICD, diabetes, neuropathy, CAD, CKD stage IIIb, atrial fibrillation, seizure disorder, ventricular fibrillation, hypertension, and hyperlipidemia.  She presented to the ED on 05/20/2023 feeling unwell for several days and reporting her ICD fired.  She has found to have influenza A and pneumonia. She was intubated in the ED due to increased work of breathing prompting intubation. She is admitted with septic shock.   Subjective:   Subjective: Chart Reviewed. Updates received. Patient Assessed. Created space and opportunity for patient  and family to explore thoughts and feelings regarding current medical situation.  Today's Discussion: Per previous patient sign off family yesterday I had made the patient DNR and were considering transition to comfort once other family members were able to arrive.  I received a message this morning inquiring about timeline for transition to comfort care.  Family was anticipated to arrive around 12:00 this afternoon.  Around the time the family arrived I received a message from the nurse that the patient went into VT/V-fib and BP was 24/18.  I arrived to the patient's room to find family at the bedside, [name] Forehand, NP with PCCM was at the bedside and informed me that the patient self converted out of arrhythmia when CRRT was stopped.  The plan is to not restart CRRT, trying to get other family to the bedside at which point PCCM will de-escalate care and compassionately extubate.  Whitney states that PCCM can handle transition to comfort care and management, anticipate  passing very shortly after extubation.  She will inform PMD if any ongoing palliative needs.  I provided emotional and general support through therapeutic listening, empathy, sharing of stories, and other techniques. I answered all questions and addressed all concerns to the best of my ability.  Review of Systems  Unable to perform ROS   Objective:   Vital Signs:  BP (!) 153/47   Pulse (!) 55   Temp (!) 97.5 F (36.4 C)   Resp (!) 35   Ht 5\' 2"  (1.575 m)   Wt 90.6 kg   SpO2 100%   BMI 36.53 kg/m   Physical Exam Vitals and nursing note reviewed.  Constitutional:      General: She is not in acute distress.    Appearance: She is ill-appearing.     Interventions: She is sedated and intubated.  Cardiovascular:     Rate and Rhythm: Normal rate.  Pulmonary:     Effort: Pulmonary effort is normal. No respiratory distress. She is intubated.     Palliative Assessment/Data: 10%    Existing Vynca/ACP Documentation: None  Assessment & Plan:   Impression: Present on Admission:  VT (ventricular tachycardia) (HCC)  Ventricular fibrillation (HCC)  Spinal stenosis of lumbar region  PAF (paroxysmal atrial fibrillation) (HCC)  Obesity, unspecified  Lower back pain  Implantable cardioverter-defibrillator (ICD) in situ  Hypothyroidism  Hyperlipidemia  HTN (hypertension)  DM (diabetes mellitus), type 2 with renal complications (HCC)  Diabetic peripheral neuropathy associated with type 2 diabetes mellitus (HCC)  Coronary artery disease  (Resolved) CKD (chronic kidney disease)  Gastroesophageal reflux disease without esophagitis  Chronic systolic CHF (  congestive heart failure) (HCC)  Acute respiratory failure with hypoxia (HCC)  Acute renal failure superimposed on stage 3b chronic kidney disease (HCC)   SUMMARY OF RECOMMENDATIONS   DNR Anticipate de-escalation, compassionate extubation, transition to comfort by PCCM Palliative medicine will back off at this time Please  notify us of any new palliative needs  Symptom Management:  Per PCCM PMT is available to assist if needed  Code Status: DNR-interventions desired  Prognosis: Hours - Days  Discharge Planning: Anticipated Hospital Death  Discussed with: Medical team, nursing team, family  Thank you for allowing [name] to participate in the care of [name] PMT will continue to support holistically.  Time Total: 25 min  Detailed review of medical records (labs, imaging, vital signs), medically appropriate exam, discussed with treatment team, counseling and education to patient, family, & staff, documenting clinical information, medication management, coordination of care  Wynne Dust, NP Palliative Medicine Team  Team Phone # [phone] (Nights/Weekends)  11/19/2020, 8:17 AM

## 2023-06-22 NOTE — Progress Notes (Signed)
 NAME:  [name], MRN:  [id number], DOB:  09-17-1945, LOS: 14 ADMISSION DATE:  05/20/2023, CONSULTATION DATE:  05/20/2023 REFERRING MD:  Alinda Money - [facility] CHIEF COMPLAINT:  N/V/Abd pain / Resp failure   History of Present Illness:  78 year old female with medical history significant of hypertension, hyperlipidemia, diabetes, neuropathy, hypothyroidism, CKD 3B, CAD, GERD, CVA, chronic systolic CHF, depression, anxiety, ventricular tachycardia, ventricular fibrillation, atrial fibrillation, status post ICD, obesity, seizure disorder, low back pain, spinal stenosis presenting after feeling unwell for several days and recent ICD discharges.   Patient reports that she has been unwell for possible days.  She is experience nausea vomiting and abdominal pain.  She reports that her defibrillator fired multiple times today.  ED provider confirmed this with the [facility] representative who noted that the device did fire 3 times a day for ventricular fibrillation.  Also noted periods of slow ventricular tachycardia as well as atrial fibrillation.   Patient denies fevers, chills.   ED Course: Vital signs in the ED notable for blood pressure initially in the 70 systolic now improved to the 120s.  Respiratory rate in the 20s.  Requiring 3 L to maintain saturations.  Lab workup included CMP with potassium 3.2, bicarb 18, gap 17, BUN 32, creatinine elevated 2.37 baseline of 2, glucose 2 1, calcium 7.9, protein 6.0, albumin 2.5, T. bili 1.9, AST 1076, ALT 663.  CBC with platelets 142.  BNP elevated to 2548.  Troponin trend 339, 417.  Lipase normal.  Respiratory panel positive for flu A.  Lactic acid pending.  Urinalysis pending.  Blood cultures pending.  VBG with normal pH pCO2 40.7.  Magnesium pending.  Chest x-ray with new bilateral opacities consistent with pneumonia.  CT of the abdomen pelvis showing no acute normality in the abdomen pelvis, bilateral opacity at the lung bases, left renal cysts.  Patient received  ceftriaxone, doxycycline, Tamiflu in the ED.  Started on amiodarone infusion.  Also received morphine, 1 L IV fluids and 40 mEq p.o. potassium x 2.  Cardiology consulted and recommended amiodarone fusion, repletion of magnesium and potassium as needed, adding back beta-blocker as blood pressure allows.  Pertinent Medical History:   has a past medical history of Acute kidney injury superimposed on chronic kidney disease (HCC) (10/02/2022), Allergy, Arthritis, Atrial fibrillation (HCC), Chronic renal insufficiency, Chronic systolic CHF (congestive heart failure) (HCC) (03/20/2013), Chronic systolic heart failure (HCC), Coronary artery disease (01/31/2011), Diabetes mellitus, 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, History of chicken pox, Hyperlipidemia, Hypertension, Ischemic cardiomyopathy, Knee MCL sprain (03/28/2014), Left knee pain (08/30/2012), Lumbar spondylosis (01/11/2012), Pes anserine bursitis (02/23/2014), Sleep apnea, Stroke (HCC) (2010), Suicide attempt by benzodiazepine overdose (HCC) (01/21/2020), UTI (urinary tract infection) (08/03/2013), and Ventricular tachycardia (HCC).   Significant Hospital Events: Including procedures, antibiotic start and stop dates in addition to other pertinent events   2/27 Presented to ED feeling unwell, s/p AICD discharge x 3 for VT/slow VT. Hypotensive with increased WOB prompting intubation. Flu A+,  CXR c/w PNA. 2/28 Remains on high-dose vasopressor support with Levophed. No more episodes of V. Tach. Remain acidotic mixed metabolic and respiratory. 3/1 Remains on ventilator with FiO2 50 PEEP 8, remains on both levo and vaso 3/3 extubated to bipap 3/4 treated w/ lasix still on NIPPV 3/5 inc'd WOB. CXR still w/ marked bilateral airspace disease. ABG w/ sig hypoxia and resp alk. PEEP inc'd on NIPPV. Increased lasix. Widened ABX coverage due to clinical decline  3/6 attempting short rotations of [facility] but still w/  marked WOB.  Goals of care discussion  with family they continued to request aggressive care.  Shared with them my concern about limiting factor would be renal function, given underlying cardiomyopathy she would be a poor candidate for dialysis 3/7 renal function a little worse CVP 6, using albumin and Lasix to try to mobilize total body overload still BiPAP dependent 3/9 reintubated, pan-CT 3/10 AHF consult 3/11 RHC, renal consult 3/13: overall no significant improvement. Met with palliative and decision for DNR with likely comfort care tonight once family has been able to see her  3/14 Remains on vent and CRRT with vasopressors support, awaiting family arrival to discuss transition to comfort care  Interim History / Subjective:  Sedated on vent   Objective:  Blood pressure (!) 130/45, pulse 60, temperature 97.7 F (36.5 C), resp. rate (!) 35, height 5\' 2"  (1.575 m), weight 90.6 kg, SpO2 100%. CVP:  [0 mmHg-16 mmHg] 5 mmHg  Vent Mode: PRVC FiO2 (%):  [50 %-70 %] 60 % Set Rate:  [32 bmp-35 bmp] 35 bmp Vt Set:  [400 mL] 400 mL PEEP:  [10 cmH20] 10 cmH20 Plateau Pressure:  [21 cmH20-31 cmH20] 31 cmH20   Intake/Output Summary (Last 24 hours) at 06/07/2023 0924 Last data filed at 06/03/2023 0900 Gross per 24 hour  Intake 3302.87 ml  Output 2050.9 ml  Net 1251.97 ml   Filed Weights   06/02/23 0620 06/03/23 0400 05/30/2023 0600  Weight: 91.2 kg 88.3 kg 90.6 kg   Physical Examination: General: Acute on chronically ill appearing elderly female lying in bed on mechanical ventilation, in NAD HEENT: [place]/ATT, MM pink/moist, PERRL,  Neuro: Sedated on vent  CV: s1s2 regular rate and rhythm, no murmur, rubs, or gallops,  PULM:  Crackled to bases, tolerating vent,. No increased work of breathing, no added breath sounds  GI: soft, bowel sounds active in all 4 quadrants, non-tender, non-distended, tolerating oral diet Extremities: warm/dry, no edema  Skin: no rashes or lesions  Resolved Hospital Problem List:  Septic shock Shock  liver, improved  Assessment & Plan:  Acute respiratory failure with hypoxia and hypercapnia -Odd case; flu ARDS was 10 days ago; lingering issues thought to be some combination fluid overload and HCAP but both have been treated.  Failed extubation trial and now workup shows (A) normal ESR so doubtful COP/Amio lung (B) normal Pct and (C) persistent upper lobe GGO/crazing paving, (D) elevated CVP but okay coox.  RHC today c/w septic state with high CO.   AKI on CKD4 -Subborn to diuresis Worsening thrombocytopenia -Duplex neg; bival start 3/9 and HIT sent but still pretty low suspicion; now improved DM2 with hyperglycemia HFrEF, Afib/flutter on xarelto, hx of VT w/ ICD in place -Recent ablation Jan 2025; to restart amio given normal ESR Muscular deconditioning Multiple pressure ulcers -Developed over past week  Discussion  Taken together, we are dealing with persistent vasoplegic state, ARDS type pattern CXR with negative inflammatory markers, minimally elevated wedge, and negative infectious workup to date.  We also have high dead space unable to get pH better than around 7.2 despite aggressive vent measures.  GOC meeting held with family, PCCM, and Palliative care 3/13 (see IPAL for full discussion). Decision made to change code status to DNR and transition to comfort care once family has visited  Plan Continue current intervention with no escalation Ensure adequate pain control  Coordinate with family tieing for transition   Best Practice (right click and "Reselect all SmartList Selections" daily)   Diet/type: TF  DVT prophylaxis systemic heparin Pressure ulcer(s): N/A GI prophylaxis: PPI Lines: right internal jugular CVL - 05/20/23, left femoral HD cath  Foley: Yes needed Code Status:  limited Last date of multidisciplinary goals of care discussion: see IPAL. DNR-limited. Possible comfort this evening 3/13  Critical care time:   CRITICAL CARE Performed by: Mirella Gueye D.  Harris   Total critical care time: 36 minutes  Critical care time was exclusive of separately billable procedures and treating other patients.  Critical care was necessary to treat or prevent imminent or life-threatening deterioration.  Critical care was time spent personally by me on the following activities: development of treatment plan with patient and/or surrogate as well as nursing, discussions with consultants, evaluation of patient's response to treatment, examination of patient, obtaining history from patient or surrogate, ordering and performing treatments and interventions, ordering and review of laboratory studies, ordering and review of radiographic studies, pulse oximetry and re-evaluation of patient's condition.  Annarose Ouellet D. Harris, NP-C [facility] Personal contact information can be found on Amion  If no contact or response made please call [phone] 06/05/2023, 9:24 AM

## 2023-06-22 NOTE — Progress Notes (Signed)
   06/03/2023 1200  Spiritual Encounters  Type of Visit Initial  Care provided to: Pt and family  Conversation partners present during encounter Nurse  Referral source Family;Clinical staff  Reason for visit End-of-life  OnCall Visit No  Spiritual Framework  Presenting Themes Meaning/purpose/sources of inspiration;Values and beliefs;Significant life change;Coping tools;Impactful experiences and emotions;Rituals and practive  Patient Stress Factors None identified  Family Stress Factors Loss;Loss of control;Major life changes  Interventions  Spiritual Care Interventions Made Reflective listening;Compassionate presence;Established relationship of care and support;Narrative/life review;Normalization of emotions;Self-care teaching;Prayer;Bereavement/grief support   Chaplain assisted family in processing their ANTICIPATORY GRIEF

## 2023-06-22 NOTE — Progress Notes (Signed)
 Patient extubated to comfort per MD's order.

## 2023-06-22 NOTE — Death Summary Note (Signed)
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 upper lobe. Extent and distribution is similar to previous. No pneumothorax or effusion. Osteopenia. Film is rotated. Overlapping cardiac leads. IMPRESSION: Interval placement of enteric tube extending beneath the diaphragm. Otherwise no significant interval change when adjusting for technique. Electronically Signed   By: Karen Kays M.D.   On: 05/29/2023 09:43   DG CHEST PORT 1 VIEW Result Date: 05/26/2023 CLINICAL DATA:  Respiratory failure. EXAM: PORTABLE CHEST 1 VIEW COMPARISON:  May 25, 2023. FINDINGS: Stable cardiomediastinal silhouette. Left-sided defibrillator is again noted. Stable right upper lobe opacity is noted as well as in left lung concerning for possible pneumonia. Right internal jugular catheter is unchanged. Bony thorax is unremarkable. IMPRESSION: Stable bilateral lung opacities as noted above. Electronically Signed   By: Lupita Raider M.D.   On: 05/26/2023 14:51   DG Chest Port 1 View Result Date: 05/25/2023 CLINICAL DATA:  Hypoxia EXAM: PORTABLE CHEST 1 VIEW COMPARISON:  X-ray 05/23/2023 FINDINGS: Interval movable of the ET tube. Stable  left upper chest defibrillator, right IJ catheter. Underinflation with increasing hazy opacities identified particularly right upper lung and left lung base. Recommend continued follow-up. Apical pleural thickening. No pneumothorax. Question left effusion. Stable cardiopericardial silhouette. Calcified tortuous aorta. Osteopenia and degenerative changes of the spine with some curvature. Left shoulder degenerative changes. IMPRESSION: Interval removal of the ET tube. Increasing lung opacity seen left lung base and right upper lung. Recommend continued follow-up Electronically Signed   By: Karen Kays M.D.   On: 05/25/2023 11:49   DG CHEST PORT 1 VIEW Result Date: 05/23/2023 CLINICAL DATA:  Acute hypercapnic respiratory failure. EXAM: PORTABLE CHEST 1 VIEW COMPARISON:  Chest radiograph dated 05/21/2023. FINDINGS: The heart is enlarged. Vascular calcifications are seen in the aortic arch. A left subclavian approach cardiac device is redemonstrated. An endotracheal tube terminates in the midthoracic trachea. An enteric tube enters the stomach and terminates below the field of view. A right internal jugular central venous catheter tip overlies the superior cavoatrial junction. There are mild to moderate patchy bilateral interstitial and airspace opacities, decreased from prior exam. A left pleural effusion can not be excluded. No significant pneumothorax on either side. Degenerative changes are seen in the spine. IMPRESSION: Mild to moderate patchy bilateral interstitial and airspace opacities, decreased from prior exam. Electronically Signed   By: Romona Curls M.D.   On: 05/23/2023 14:54   Portable Chest xray Result Date: 05/21/2023 CLINICAL DATA:  Intubated EXAM: PORTABLE CHEST 1 VIEW COMPARISON:  Prior chest x-ray 05/20/2023 FINDINGS: The patient is intubated. The tip of the endotracheal tube is 2.4 cm above the carina. A gastric tube is partially imaged. The tip lies off the field of view, below the diaphragm.  External defibrillator pads are in place. Left subclavian approach cardiac rhythm maintenance device in unchanged position with leads overlying the right atrium and right ventricle. Stable mild cardiomegaly. Patchy airspace opacity in the right upper lobe again noted. Additionally, patchy airspace opacities are visible in the left mid lung and left lower lobe. Findings suggest multifocal pneumonia. No pneumothorax. IMPRESSION: 1. Stable and satisfactory support apparatus. 2. Multifocal patchy airspace opacities in the right upper lobe, left  lower lobe and left mid lung suggest multifocal pneumonia. Electronically Signed   By: Malachy Moan M.D.   On: 05/21/2023 09:19   DG CHEST PORT 1 VIEW Result Date: 05/21/2023 CLINICAL DATA:  Central line placement EXAM: PORTABLE CHEST 1 VIEW COMPARISON:  Film from earlier in the same day. FINDINGS: Endotracheal tube and gastric catheter are again noted in satisfactory position. New right jugular central line is seen at the cavoatrial junction. No pneumothorax is noted. Worsening right upper lobe infiltrate is noted when compared with the prior exam. Mild left basilar airspace opacity is noted as well. Pacing device is again seen. IMPRESSION: No pneumothorax following central line placement. Increasing airspace opacity in the right upper lobe. Electronically Signed   By: Alcide Clever M.D.   On: 05/21/2023 00:31   CT ABDOMEN PELVIS WO CONTRAST Result Date: 05/20/2023 CLINICAL DATA:  Acute abdominal pain. EXAM: CT ABDOMEN AND PELVIS WITHOUT CONTRAST TECHNIQUE: Multidetector CT imaging of the abdomen and pelvis was performed following the standard protocol without IV contrast. RADIATION DOSE REDUCTION: This exam was performed according to the departmental dose-optimization program which includes automated exposure control, adjustment of the mA and/or kV according to patient size and/or use of iterative reconstruction technique. COMPARISON:  CT abdomen and pelvis  08/01/2014. FINDINGS: Lower chest: There are mild patchy opacities in both lung bases. Hepatobiliary: No focal liver abnormality is seen. Status post cholecystectomy. No biliary dilatation. Pancreas: Unremarkable. No pancreatic ductal dilatation or surrounding inflammatory changes. Spleen: Normal in size without focal abnormality. Adrenals/Urinary Tract: Adrenal glands are unremarkable. There is no hydronephrosis or urinary tract calculus. There is an 18 mm cyst in the left kidney. Bladder is unremarkable. Stomach/Bowel: Stomach is within normal limits. Appendix is not seen. No evidence of bowel wall thickening, distention, or inflammatory changes. Vascular/Lymphatic: Aortic atherosclerosis. No enlarged abdominal or pelvic lymph nodes. Reproductive: Status post hysterectomy. No adnexal masses. Other: No abdominal wall hernia or abnormality. No abdominopelvic ascites. Musculoskeletal: Multilevel degenerative changes affect the spine. IMPRESSION: 1. No acute localizing process in the abdomen or pelvis. 2. Mild patchy opacities in both lung bases, possibly atelectasis or infection. 3. Left Bosniak I benign renal cyst measuring 1.8 cm. No follow-up imaging is recommended. JACR 2018 Feb; 264-273, Management of the Incidental Renal Mass on CT, [employer] 2021; [id number], Bosniak Classification of Cystic Renal Masses, Version 2019. Aortic Atherosclerosis (ICD10-I70.0). Electronically Signed   By: Darliss Cheney M.D.   On: 05/20/2023 17:32   DG Chest Portable 1 View Result Date: 05/20/2023 CLINICAL DATA:  mild resp distress, tachypnea, hypoxia. EXAM: PORTABLE CHEST 1 VIEW COMPARISON:  12/07/2022. FINDINGS: There are new heterogeneous opacities overlying the right upper mid lung zones, concerning for pneumonia. Follow-up to clearing is recommended. Bilateral lung fields are otherwise clear. Bilateral lateral costophrenic angles are clear. Stable cardio-mediastinal silhouette. There is a left sided 2-lead pacemaker. No  acute osseous abnormalities. The soft tissues are within normal limits. IMPRESSION: New heterogeneous opacities overlying the right upper and mid lung zones, concerning for pneumonia. Follow-up to clearing is recommended. Electronically Signed   By: Jules Schick M.D.   On: 05/20/2023 15:54    Microbiology Recent Results (from the past 240 hours)  MRSA Next Gen by PCR, Nasal     Status: None   Collection Time: 05/28/23  9:55 AM   Specimen: Nasal Mucosa; Nasal Swab  Result Value Ref Range Status   MRSA by PCR Next Gen NOT DETECTED NOT DETECTED Final    Comment: (NOTE) The GeneXpert MRSA  Assay (FDA approved for NASAL specimens only), is one component of a comprehensive MRSA colonization surveillance program. It is not intended to diagnose MRSA infection nor to guide or monitor treatment for MRSA infections. Test performance is not FDA approved in patients less than 49 years old. Performed at [facility], [address], [place], [place] [address]   Culture, Respiratory w Gram Stain     Status: None   Collection Time: 05/30/23 11:14 AM   Specimen: Bronchoalveolar Lavage; Respiratory  Result Value Ref Range Status   Specimen Description BRONCHIAL ALVEOLAR LAVAGE  Final   Special Requests NONE  Final   Gram Stain   Final    FEW WBC PRESENT, PREDOMINANTLY PMN NO ORGANISMS SEEN    Culture   Final    NO GROWTH 2 DAYS Performed at [facility], [address], [place], [place] [address]    Report Status 06/01/2023 FINAL  Final  Aspergillus Ag, BAL/Serum     Status: None   Collection Time: 06/01/23  9:23 AM   Specimen: Vein  Result Value Ref Range Status   Aspergillus Ag, BAL/Serum 0.03 0.00 - 0.49 Index Final    Comment: (NOTE) Performed At: [facility] [address] [place], [place] [id number] Jolene Schimke MD WG:[id number]   MRSA Next Gen by PCR, Nasal     Status: None   Collection Time: 06/01/23 10:10 AM  Result Value Ref Range Status   MRSA by PCR  Next Gen NOT DETECTED NOT DETECTED Final    Comment: (NOTE) The GeneXpert MRSA Assay (FDA approved for NASAL specimens only), is one component of a comprehensive MRSA colonization surveillance program. It is not intended to diagnose MRSA infection nor to guide or monitor treatment for MRSA infections. Test performance is not FDA approved in patients less than 92 years old. Performed at [facility], [address], [place], [place] [address]   Respiratory (~20 pathogens) panel by PCR     Status: Abnormal   Collection Time: 06/01/23 12:46 PM   Specimen: Nasopharyngeal Swab; Respiratory  Result Value Ref Range Status   Adenovirus NOT DETECTED NOT DETECTED Final   Coronavirus 229E NOT DETECTED NOT DETECTED Final    Comment: (NOTE) The Coronavirus on the Respiratory Panel, DOES NOT test for the novel  Coronavirus (2019 nCoV)    Coronavirus HKU1 NOT DETECTED NOT DETECTED Final   Coronavirus NL63 NOT DETECTED NOT DETECTED Final   Coronavirus OC43 NOT DETECTED NOT DETECTED Final   Metapneumovirus NOT DETECTED NOT DETECTED Final   Rhinovirus / Enterovirus NOT DETECTED NOT DETECTED Final   Influenza A H1 2009 DETECTED (A) NOT DETECTED Final   Influenza B NOT DETECTED NOT DETECTED Final   Parainfluenza Virus 1 NOT DETECTED NOT DETECTED Final   Parainfluenza Virus 2 NOT DETECTED NOT DETECTED Final   Parainfluenza Virus 3 NOT DETECTED NOT DETECTED Final   Parainfluenza Virus 4 NOT DETECTED NOT DETECTED Final   Respiratory Syncytial Virus NOT DETECTED NOT DETECTED Final   Bordetella pertussis NOT DETECTED NOT DETECTED Final   Bordetella Parapertussis NOT DETECTED NOT DETECTED Final   Chlamydophila pneumoniae NOT DETECTED NOT DETECTED Final   Mycoplasma pneumoniae NOT DETECTED NOT DETECTED Final    Comment: Performed at [facility], [address] [place], [place] [address]    Lab Basic Metabolic Panel: Recent Labs  Lab 06/01/23 0413 06/01/23 0422 06/02/23 8413  06/02/23 0444 06/02/23 1329 06/02/23 1657 06/03/23 0453 06/03/23 1521  0432   8657  0609  0737  NA 144   < > 136   < > 137 134* 134*  136 137 135 132* 135 136  K 4.3   < > 4.8   < > 4.6 4.4 4.2  4.3 4.2 4.5 4.4 4.4 4.4  CL 106   < > 100  --  105 102 103 103  --  102  --   --   CO2 21*   < > 23  --  21* 25 26 23   --  24  --   --   GLUCOSE 159*   < > 227*  --  248* 270* 166* 202*  --  164*  --   --   BUN 159*   < > 86*  --  62* 60* 44* 37*  --  29*  --   --   CREATININE 3.39*   < > 1.83*  --  1.42* 1.22* 1.05* 0.88  --  0.94  --   --   CALCIUM 8.0*   < > 7.6*  --  7.9* 7.8* 8.0* 7.6*  --  7.7*  --   --   MG 2.1  --  2.3  --   --   --  2.5* 2.3  --  2.6*  --   --   PHOS 8.1*   < > 4.8*  --   --  3.9 3.0 2.8  --  3.2  --   --    < > = values in this interval not displayed.   Liver Function Tests: Recent Labs  Lab 06/02/23 1329 06/02/23 1657 06/03/23 0453 06/03/23 1521  0435  AST 19  --   --   --   --   ALT 24  --   --   --   --   ALKPHOS 61  --   --   --   --   BILITOT 1.1  --   --   --   --   PROT 6.0*  --   --   --   --   ALBUMIN 2.6* 2.6* 2.5* 2.2* 2.3*   No results for input(s): "LIPASE", "AMYLASE" in the last 168 hours. Recent Labs  Lab 05/30/23 0316  AMMONIA 18   CBC: Recent Labs  Lab 05/31/23 0427 05/31/23 0634 06/01/23 0413 06/01/23 0422 06/02/23 0336 06/02/23 0444 06/03/23 0453  0432  0435  0609  0737  WBC 18.4*  --  23.8*  --  19.2*  --  25.3*  --  32.8*  --   --   HGB 8.5*   < > 8.2*   < > 8.1*   < > 8.4*  9.9* 9.2* 8.0* 9.5* 8.8*  HCT 25.8*   < > 24.8*   < > 24.5*   < > 26.2*  29.0* 27.0* 25.5* 28.0* 26.0*  MCV 84.9  --  86.7  --  86.3  --  88.5  --  92.1  --   --   PLT 76*  --  102*  --  111*  --  155  --  177  --   --    < > = values in this interval not displayed.   Cardiac Enzymes: Recent Labs  Lab 06/01/23 0923  CKTOTAL 45   Sepsis Labs: Recent Labs  Lab  05/30/23 0316 05/30/23 0757 05/30/23 1454 05/31/23 0427 06/01/23 0413 06/01/23 8469 06/02/23 0336 06/03/23 0453  0435  PROCALCITON 2.49  --   --   --   --  1.57  --   --   --   WBC 19.3*  --   --    < > 23.8*  --  19.2* 25.3* 32.8*  LATICACIDVEN  --  0.7 1.0  --   --   --   --   --   --    < > = values in this interval not displayed.    Lorin Glass 06/05/2023, 5:09 PM

## 2023-06-22 NOTE — Progress Notes (Signed)
 OT Cancellation and Discharge Note  Patient Details Name: [name] MRN: [id number] DOB: [birth year]   Cancelled Treatment:    Reason Eval/Treat Not Completed: Other (comment);Medical issues which prohibited therapy (Per RN, pt not medically appropriate for participation in OT at this time. Also per RN, plan for pt to transition to comfort care; due to this, RN requests OT sign off at this time. Please re-consult as appropriate.)  Kees Idrovo "Orson Eva., OTR/L, MA Acute Rehab [phone]  Lendon Colonel 06/15/2023, 10:22 AM

## 2023-06-22 NOTE — Progress Notes (Signed)
 [facility] NEPHROLOGY PROGRESS NOTE  Assessment/ Plan: Pt is a 78 y.o. yo female  with past medical history significant for hypertension, DM, A-fib, CAD, H LD, stroke, CHF, CKD 3B-4 who was admitted with V. tach, influenza A positive, septic and cardiac shock and pneumonia, consulted for AKI on CKD.   # Acute kidney injury on CKD 3b-4 due to ATN: Minimal response with IV diuretics.  RHC with right heart failure.  Started CRRT on 3/11  Continue current CRRT prescription. All 4K bath, UF goal 50-100 mL/per hour, heparin for anticoagulation.   #Acute hypoxic respiratory failure due to influenza A, secondary bacterial pneumonia, possible ARDS, septic shock: On antibiotics and pressor per ICU team.   #Acute on chronic systolic CHF initially treated with diuretics and now volume management with CRRT.  Heart failure team is following.   #Thrombocytopenia, HIT negative,  Angiomax discontinued and transitioned back to heparin.  Dispo-  discussions being held- possibly close to converting to comfort care -  keep all therapies going til then   Subjective:  Tolerating CRRT well.  Still on Levophed. O2 req up overnight.   UF around 100 cc an hour-  had not been able to hit-  according to nursing are able to pull again.  No UOP.   Discussed with ICU team. Seems like the plan is moving toward comfort care-     Objective Vital signs in last 24 hours: Vitals:   05/22/2023 0545 06/17/2023 0600 05/31/2023 0614 06/20/2023 0615  BP:  (!) 112/42    Pulse: 65 60  60  Resp: (!) 31 (!) 33  (!) 35  Temp:    98.4 F (36.9 C)  TempSrc:      SpO2: 99% 100% 99% 98%  Weight:  90.6 kg    Height:       Weight change: 2.3 kg  Intake/Output Summary (Last 24 hours) at 06/05/2023 0747 Last data filed at 06/15/2023 0600 Gross per 24 hour  Intake 3202.46 ml  Output 1894.8 ml  Net 1307.66 ml       Labs: RENAL PANEL Recent Labs  Lab 06/01/23 0413 06/01/23 0422 06/02/23 0336 06/02/23 0444  06/02/23 1329 06/02/23 1657 06/03/23 0453 06/03/23 1521 06/03/2023 0432 05/25/2023 0435 06/14/2023 0609 06/06/2023 0737  NA 144   < > 136   < > 137 134* 134*  136 137 135 132* 135 136  K 4.3   < > 4.8   < > 4.6 4.4 4.2  4.3 4.2 4.5 4.4 4.4 4.4  CL 106   < > 100  --  105 102 103 103  --  102  --   --   CO2 21*   < > 23  --  21* 25 26 23   --  24  --   --   GLUCOSE 159*   < > 227*  --  248* 270* 166* 202*  --  164*  --   --   BUN 159*   < > 86*  --  62* 60* 44* 37*  --  29*  --   --   CREATININE 3.39*   < > 1.83*  --  1.42* 1.22* 1.05* 0.88  --  0.94  --   --   CALCIUM 8.0*   < > 7.6*  --  7.9* 7.8* 8.0* 7.6*  --  7.7*  --   --   MG 2.1  --  2.3  --   --   --  2.5* 2.3  --  2.6*  --   --   PHOS 8.1*   < > 4.8*  --   --  3.9 3.0 2.8  --  3.2  --   --   ALBUMIN  --    < >  --   --  2.6* 2.6* 2.5* 2.2*  --  2.3*  --   --    < > = values in this interval not displayed.    Liver Function Tests: Recent Labs  Lab 06/02/23 1329 06/02/23 1657 06/03/23 0453 06/03/23 1521 06/05/2023 0435  AST 19  --   --   --   --   ALT 24  --   --   --   --   ALKPHOS 61  --   --   --   --   BILITOT 1.1  --   --   --   --   PROT 6.0*  --   --   --   --   ALBUMIN 2.6*   < > 2.5* 2.2* 2.3*   < > = values in this interval not displayed.   No results for input(s): "LIPASE", "AMYLASE" in the last 168 hours. Recent Labs  Lab 05/30/23 0316  AMMONIA 18   CBC: Recent Labs    06/01/23 0923 06/02/23 0325 06/02/23 1140 06/03/23 0453 06/16/2023 0432 05/30/2023 0435 05/25/2023 0609 06/17/2023 0737  HGB  --    < >  --  8.4*  9.9* 9.2* 8.0* 9.5* 8.8*  MCV  --    < >  --  88.5  --  92.1  --   --   FERRITIN 112  --  168  --   --   --   --   --    < > = values in this interval not displayed.    Cardiac Enzymes: Recent Labs  Lab 06/01/23 0923  CKTOTAL 45   CBG: Recent Labs  Lab 06/03/23 1519 06/03/23 1958 06/02/2023 0002 06/03/2023 0429 05/23/2023 0734  GLUCAP 194* 213* 141* 154* 155*    Iron Studies:  Recent  Labs    06/02/23 1140  FERRITIN 168   Studies/Results: No results found.   Medications: Infusions:  feeding supplement (VITAL 1.5 CAL) 45 mL/hr at 05/31/2023 0600   fentaNYL infusion INTRAVENOUS 175 mcg/hr (05/26/2023 0600)   heparin 1,100 Units/hr (06/14/2023 0600)   levETIRAcetam Stopped (06/03/23 2210)   norepinephrine (LEVOPHED) Adult infusion 1 mcg/min (06/06/2023 0600)   piperacillin-tazobactam Stopped (06/14/2023 0547)   prismasol BGK 4/2.5 400 mL/hr at 05/27/2023 0414   prismasol BGK 4/2.5 400 mL/hr at 06/10/2023 0415   prismasol BGK 4/2.5 1,500 mL/hr at 06/20/2023 0747   propofol (DIPRIVAN) infusion 60 mcg/kg/min (05/22/2023 0600)    Scheduled Medications:  amiodarone  200 mg Per Tube Daily   aspirin  81 mg Per Tube Daily   atorvastatin  80 mg Per Tube Daily   busPIRone  5 mg Per Tube BID   Chlorhexidine Gluconate Cloth  6 each Topical Daily   docusate  100 mg Per Tube BID   feeding supplement (PROSource TF20)  60 mL Per Tube BID   fiber supplement (BANATROL TF)  60 mL Per Tube BID   gabapentin  200 mg Per Tube Q12H   hydrocortisone sod succinate (SOLU-CORTEF) inj  100 mg Intravenous Q8H   insulin aspart  0-20 Units Subcutaneous Q4H   insulin aspart  10 Units Subcutaneous Q4H   insulin glargine  20 Units Subcutaneous BID   ipratropium-albuterol  3  mL Nebulization Q6H   midodrine  10 mg Per Tube Q8H   multivitamin  1 tablet Per Tube QHS   mouth rinse  15 mL Mouth Rinse Q2H   pantoprazole (PROTONIX) IV  40 mg Intravenous QHS   polyethylene glycol  17 g Per Tube BID   sennosides  10 mL Per Tube BID   sertraline  50 mg Per Tube QHS    have reviewed scheduled and prn medications.  Physical Exam: General: Ill looking female, intubated, sedated Heart:RRR, s1s2 nl Lungs: Coarse breath sound bilateral. Abdomen:soft, non-distended Extremities: diffuse dependent edema Dialysis Access: Left femoral line placed by ICU on 3/11.  [name] A Nyoka Alcoser 06/07/2023,7:47 AM  LOS: 14 days

## 2023-06-22 NOTE — Progress Notes (Signed)
 No BP, No Pulse, No Respirations verified by myself and Horace Porteous, RN. Death pronounced at 27.  Son, daughter, granddaughters and multiple other family members present at bedside.

## 2023-06-22 NOTE — Progress Notes (Signed)
 PHARMACY - ANTICOAGULATION CONSULT NOTE  Pharmacy Consult for heparin  Indication: atrial fibrillation  Allergies  Allergen Reactions   Veltassa [Patiromer] Diarrhea   Aldactone [Spironolactone] Diarrhea and Nausea And Vomiting   Darvon [Propoxyphene] Other (See Comments)    Indigestion    Mexitil [Mexiletine] Nausea And Vomiting and Other (See Comments)    Tremors Difficulty walking Blurred vision   Phenergan [Promethazine Hcl] Other (See Comments)    Hyperactivity    Ranexa [Ranolazine Er] Diarrhea   Plavix [Clopidogrel Bisulfate] Rash    Patient Measurements: Height: 5\' 2"  (157.5 cm) Weight: 90.6 kg (199 lb 11.8 oz) IBW/kg (Calculated) : 50.1 HEPARIN DW (KG): 68.6   Vital Signs: Temp: 97.5 F (36.4 C) (03/14 1200) Temp Source: Bladder (03/14 0200) BP: 153/47 (03/14 1200) Pulse Rate: 55 (03/14 1200)  Labs: Recent Labs    06/02/23 0336 06/02/23 0442 06/02/23 0444 06/03/23 0453 06/03/23 0954 06/03/23 1521 05/29/2023 0432 05/27/2023 0435 06/17/2023 0609 05/25/2023 0737  HGB 8.1*  --    < > 8.4*  9.9*  --   --    < > 8.0* 9.5* 8.8*  HCT 24.5*  --    < > 26.2*  29.0*  --   --    < > 25.5* 28.0* 26.0*  PLT 111*  --   --  155  --   --   --  177  --   --   APTT  --  68*  --  131* 107*  --   --   --   --   --   HEPARINUNFRC  --   --    < > 0.45 0.40  --   --  0.67  --   --   CREATININE 1.83*  --    < > 1.05*  --  0.88  --  0.94  --   --    < > = values in this interval not displayed.    Estimated Creatinine Clearance: 53.3 mL/min (by C-G formula based on SCr of 0.94 mg/dL).   Medical History: Past Medical History:  Diagnosis Date   Acute kidney injury superimposed on chronic kidney disease (HCC) 10/02/2022   Allergy    Arthritis    Atrial fibrillation (HCC)    controlled with amiodarone, on coumadin   Chronic renal insufficiency    Chronic systolic CHF (congestive heart failure) (HCC) 03/20/2013   Chronic systolic heart failure (HCC)    Coronary artery  disease 01/31/2011   Diabetes mellitus    type 1   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    History of chicken pox    Hyperlipidemia    Hypertension    Ischemic cardiomyopathy    Knee MCL sprain 03/28/2014   Left knee pain 08/30/2012   Lumbar spondylosis 01/11/2012   Pes anserine bursitis 02/23/2014   Sleep apnea    Stroke (HCC) 2010   eye doctor said she had TIA   Suicide attempt by benzodiazepine overdose (HCC) 01/21/2020   UTI (urinary tract infection) 08/03/2013   Ventricular tachycardia (HCC)    Polymorphic    Medications:  Medications Prior to Admission  Medication Sig Dispense Refill Last Dose/Taking   amiodarone (PACERONE) 200 MG tablet Take 1 tablet (200 mg total) by mouth daily. 30 tablet 6 05/20/2023 Morning   aspirin EC 81 MG tablet Take 1 tablet (81 mg total) by mouth daily. Swallow whole. 30 tablet 12 05/20/2023 Morning   busPIRone (BUSPAR) 5 MG tablet Take 1 tablet (5 mg  total) by mouth 2 (two) times daily. 60 tablet 1 05/20/2023 Morning   carvedilol (COREG) 25 MG tablet TAKE 1 TABLET (25 MG TOTAL) BY MOUTH TWICE A DAY WITH MEALS 180 tablet 3 05/20/2023 Morning   Cholecalciferol (VITAMIN D-3 PO) Take 1 tablet by mouth daily.   05/20/2023 Morning   dicyclomine (BENTYL) 10 MG capsule TAKE 1 CAPSULE (10 MG TOTAL) BY MOUTH 4 (FOUR) TIMES DAILY BEFORE MEALS AND AT BEDTIME. 120 capsule 1 05/20/2023 Morning   furosemide (LASIX) 20 MG tablet Take 2 tablets (40 mg) by mouth once a day. 60 tablet 11 05/20/2023 Morning   gabapentin (NEURONTIN) 100 MG capsule Take 200 mg by mouth 2 (two) times daily.   05/20/2023 Morning   insulin aspart (NOVOLOG FLEXPEN) 100 UNIT/ML FlexPen Inject up to 15 units daily under skin as advised (Patient taking differently: Inject 4-5 Units into the skin 3 (three) times daily before meals. Inject up to 15 units daily under skin as advised) 15 mL 11 Past Week   insulin degludec (TRESIBA FLEXTOUCH) 100 UNIT/ML FlexTouch Pen Inject 12-14 Units into the  skin daily. (Patient taking differently: Inject 14 Units into the skin at bedtime.) 9 mL 3 05/19/2023 Bedtime   levETIRAcetam (KEPPRA XR) 500 MG 24 hr tablet Take 1 tablet (500 mg total) by mouth at bedtime. 90 tablet 3 05/19/2023 Bedtime   losartan (COZAAR) 25 MG tablet Take 25 mg by mouth daily.   05/20/2023 Morning   Multiple Vitamins-Minerals (EYE VITAMINS PO) Take 1 tablet by mouth 2 (two) times daily.   05/20/2023 Morning   nitroGLYCERIN (NITROSTAT) 0.4 MG SL tablet Place 1 tablet (0.4 mg total) under the tongue every 5 (five) minutes as needed for chest pain. 25 tablet 3 Past Month   omeprazole (PRILOSEC) 20 MG capsule Take 1 capsule by mouth daily.   05/20/2023 Morning   Rivaroxaban (XARELTO) 15 MG TABS tablet Take 1 tablet (15 mg total) by mouth daily with supper. (Patient taking differently: Take 15 mg by mouth at bedtime.) 30 tablet 6 05/19/2023 Bedtime   rosuvastatin (CRESTOR) 40 MG tablet Take 1 tablet (40 mg total) by mouth daily. NEEDS FOLLOW UP APPOINTMENT FOR MORE REFILLS (Patient taking differently: Take 40 mg by mouth at bedtime.) 90 tablet 0 05/19/2023 Bedtime   sertraline (ZOLOFT) 50 MG tablet Take 50 mg by mouth at bedtime.   05/19/2023 Bedtime   Continuous Glucose Sensor (FREESTYLE LIBRE 3 SENSOR) MISC 1 each by Does not apply route every 14 (fourteen) days. 6 each 3    magnesium oxide (MAG-OX) 400 (240 Mg) MG tablet TAKE 1 TABLET BY MOUTH TWICE A DAY (Patient not taking: Reported on 05/20/2023) 180 tablet 1 Not Taking   Scheduled:   amiodarone  200 mg Per Tube Daily   aspirin  81 mg Per Tube Daily   atorvastatin  80 mg Per Tube Daily   busPIRone  5 mg Per Tube BID   Chlorhexidine Gluconate Cloth  6 each Topical Daily   docusate  100 mg Per Tube BID   feeding supplement (PROSource TF20)  60 mL Per Tube BID   fiber supplement (BANATROL TF)  60 mL Per Tube BID   gabapentin  200 mg Per Tube Q12H   hydrocortisone sod succinate (SOLU-CORTEF) inj  100 mg Intravenous Q8H   insulin  aspart  0-20 Units Subcutaneous Q4H   insulin aspart  10 Units Subcutaneous Q4H   insulin glargine  20 Units Subcutaneous BID   ipratropium-albuterol  3 mL Nebulization Q6H  midodrine  10 mg Per Tube Q8H   multivitamin  1 tablet Per Tube QHS   mouth rinse  15 mL Mouth Rinse Q2H   pantoprazole (PROTONIX) IV  40 mg Intravenous QHS   polyethylene glycol  17 g Per Tube BID   sennosides  10 mL Per Tube BID   sertraline  50 mg Per Tube QHS    Assessment: 78 yo female here with VT and ICD shock noted with, VDRF and AKI. 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 Previous concern for HIT, HIT antibody negative, bival change to heparin this morning.   Heparin level 0.67 therapeutic on 1100 units/hr. No pauses or issues with heparin infusion per RN. CBC ok.  Goal of Therapy:  Anti-Xa 0.3-0.7  Monitor platelets by anticoagulation protocol: Yes   Plan:  Continue heparin to 1100u/hr Daily heparin level, CBC Monitor s/s bleeding  F/u GOC  Trixie Rude, PharmD Clinical Pharmacist 06/19/2023  12:24 PM

## 2023-06-22 NOTE — Progress Notes (Signed)
 PCCM Progress Note   While awaiting for family to arrive for ongoing GOC discussion I was notified that patient had transitioned to VT. On arrival several family members were at bedside. CRRT was stopped and VT spontaneously resolved. Family is in the process of contacting other members to be with patient. At this time we will continue current interventions to allow family time to arrive with plans to transition to comfort care within the next few horus .   [name] Mcgrady D. Harris, NP-C [facility] Personal contact information can be found on Amion  If no contact or response made please call [phone] 06/10/2023, 12:56 PM

## 2023-06-22 NOTE — Progress Notes (Addendum)
 Advanced Heart Failure Rounding Note  Cardiologist: [name] Ancona, MD   Chief Complaint: Heart Failure   Subjective:    Heavily sedated.   Persistent acidosis despite max vent support. Continues on CRRT.  CO-OX 78%. Remains on 5 NE.   Objective:    Weight Range: 90.6 kg Body mass index is 36.53 kg/m.   Vital Signs:   Temp:  [82.8 F (28.2 C)-99 F (37.2 C)] 97.7 F (36.5 C) (03/14 1115) Pulse Rate:  [55-73] 55 (03/14 1115) Resp:  [14-35] 35 (03/14 1115) BP: (93-156)/(40-54) 149/51 (03/14 1100) SpO2:  [89 %-100 %] 100 % (03/14 1115) Arterial Line BP: (73-167)/(17-49) 160/44 (03/14 1115) FiO2 (%):  [50 %-70 %] 60 % (03/14 0801) Weight:  [90.6 kg] 90.6 kg (03/14 0600) Last BM Date : 05/28/2023  Weight change: Filed Weights   06/02/23 0620 06/03/23 0400 05/23/2023 0600  Weight: 91.2 kg 88.3 kg 90.6 kg   Intake/Output:  Intake/Output Summary (Last 24 hours) at 05/31/2023 1148 Last data filed at 06/15/2023 1100 Gross per 24 hour  Intake 2975.13 ml  Output 2148.5 ml  Net 826.63 ml    Physical Exam   General:  critically ill appearing HEENT: + ETT Cor: Regular rate & rhythm. No murmurs. Lungs: breathing not labored Abdomen: obese, soft, nondistended.  Extremities: diffuse edema upper and lower extremities Neuro: sedated  Telemetry   A paced 60  EKG    [name]   Labs    CBC Recent Labs    06/03/23 0453 06/17/2023 0432 06/14/2023 0435 05/29/2023 0609 06/16/2023 0737  WBC 25.3*  --  32.8*  --   --   HGB 8.4*  9.9*   < > 8.0* 9.5* 8.8*  HCT 26.2*  29.0*   < > 25.5* 28.0* 26.0*  MCV 88.5  --  92.1  --   --   PLT 155  --  177  --   --    < > = values in this interval not displayed.   Basic Metabolic Panel Recent Labs    16/10/96 1521 06/13/2023 0432 06/16/2023 0435 06/02/2023 0609 06/19/2023 0737  NA 137   < > 132* 135 136  K 4.2   < > 4.4 4.4 4.4  CL 103  --  102  --   --   CO2 23  --  24  --   --   GLUCOSE 202*  --  164*  --   --   BUN 37*  --  29*   --   --   CREATININE 0.88  --  0.94  --   --   CALCIUM 7.6*  --  7.7*  --   --   MG 2.3  --  2.6*  --   --   PHOS 2.8  --  3.2  --   --    < > = values in this interval not displayed.   Liver Function Tests Recent Labs    06/02/23 1329 06/02/23 1657 06/03/23 1521 06/11/2023 0435  AST 19  --   --   --   ALT 24  --   --   --   ALKPHOS 61  --   --   --   BILITOT 1.1  --   --   --   PROT 6.0*  --   --   --   ALBUMIN 2.6*   < > 2.2* 2.3*   < > = values in this interval not displayed.   No results  for input(s): "LIPASE", "AMYLASE" in the last 72 hours. Cardiac Enzymes No results for input(s): "CKTOTAL", "CKMB", "CKMBINDEX", "TROPONINI" in the last 72 hours.  BNP: BNP (last 3 results) Recent Labs    03/12/23 1107 05/20/23 1223 05/31/23 0634  BNP 124.1* 2,548.1* 1,837.9*   Fasting Lipid Panel Recent Labs    05/31/2023 0435  TRIG 98   Imaging   No results found.  Medications:    Scheduled Medications:  amiodarone  200 mg Per Tube Daily   aspirin  81 mg Per Tube Daily   atorvastatin  80 mg Per Tube Daily   busPIRone  5 mg Per Tube BID   Chlorhexidine Gluconate Cloth  6 each Topical Daily   docusate  100 mg Per Tube BID   feeding supplement (PROSource TF20)  60 mL Per Tube BID   fiber supplement (BANATROL TF)  60 mL Per Tube BID   gabapentin  200 mg Per Tube Q12H   hydrocortisone sod succinate (SOLU-CORTEF) inj  100 mg Intravenous Q8H   insulin aspart  0-20 Units Subcutaneous Q4H   insulin aspart  10 Units Subcutaneous Q4H   insulin glargine  20 Units Subcutaneous BID   ipratropium-albuterol  3 mL Nebulization Q6H   midodrine  10 mg Per Tube Q8H   multivitamin  1 tablet Per Tube QHS   mouth rinse  15 mL Mouth Rinse Q2H   pantoprazole (PROTONIX) IV  40 mg Intravenous QHS   polyethylene glycol  17 g Per Tube BID   sennosides  10 mL Per Tube BID   sertraline  50 mg Per Tube QHS   Infusions:  feeding supplement (VITAL 1.5 CAL) 45 mL/hr at 06/17/2023 1100   fentaNYL  infusion INTRAVENOUS Stopped (06/14/2023 1025)   heparin 1,100 Units/hr (06/20/2023 1100)   HYDROmorphone 2 mg/hr (05/27/2023 1100)   levETIRAcetam Stopped (05/26/2023 0944)   midazolam 2.5 mg/hr (06/09/2023 1100)   norepinephrine (LEVOPHED) Adult infusion 5 mcg/min (06/06/2023 1100)   piperacillin-tazobactam 3.375 g (06/15/2023 1122)   prismasol BGK 4/2.5 400 mL/hr at 06/14/2023 0414   prismasol BGK 4/2.5 400 mL/hr at 06/03/2023 0415   prismasol BGK 4/2.5 1,500 mL/hr at 06/06/2023 1111   propofol (DIPRIVAN) infusion Stopped (05/23/2023 0841)   PRN Medications: acetaminophen (TYLENOL) oral liquid 160 mg/5 mL, bisacodyl, fentaNYL, hydrALAZINE, ipratropium-albuterol, midazolam, mouth rinse  Patient Profile   [name] is a 78 year old with a history of DMII,  CKD stage IV, A fib, VT, [employer] Jude ICD, CAD, HLD, HTN, OSA, CVA, VT ablation 1/25, and HFrEF.    Admitted with VT x3 , Flu A , CAP, and shock.  Assessment/Plan   1. Shock: Suspect primarily septic/distributive.  Not on inotropes.  Currently on NE 5.  RHC 3/11 with high cardiac output and low SVR at 581.   - NE and Abx coverage for now until transitions to comfort measures. 2. Acute on chronic systolic CHF:  Ischemic cardiomyopathy.  Echo this admission with EF 40% with low normal RV function, weakly positive bubble study, IVC dilated. This is comparable to the past.  AKI with attempt at diuresis.  RHC 3/11 showed primarily RV failure with RA 14, PCWP 13.  Cardiac output high with primarily pulmonary venous hypertension.  CVVH started on 3/11, current UF goal -50/hr.  Continuing for now.  3. Acute hypoxemic respiratory failure: Influenza A and likely secondary bacterial PNA with possible aspiration and now ARDS picture.  Septic shock picture on RHC.  - No improvement with treatment failed  extubation trial. Normal ESR and PCT.  - Unable to get pH better than 7.2 on vent - Continue Zosyn per CCM.  4. AKI on CKD stage 3: Creatinine baseline around 2.  Now  suspect ATN with AKI in setting of shock. Started CVVH on 3/11.  - Continue CVVH as above until family arrives 5. Atrial fibrillation/flutter: Paroxysmal.  In NSR.  - Continue amiodarone po.  - HIT negative, back on heparin gtt.  6. Thrombocytopenia: HIT negative, suspect inflammatory.  - Now resolved 7. CAD: Last cath in 7/24 with severe stenosis distal LCx managed medically, patent stents. No ACS this admission.  - Continue statin.  8. 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  Does not tolerate mexiletine.  VT ablation 1/25.  Had VT at admission with ATP. No further VT overnight. - Continue amiodarone po.  9. GOC: Not making progress towards extubation and has MSOF. She is now DNR. Family arriving today to visit. Anticipate transition to comfort measures later today.   CRITICAL CARE Performed by: [name]   Total critical care time: 14 minutes  Critical care time was exclusive of separately billable procedures and treating other patients.  Critical care was necessary to treat or prevent imminent or life-threatening deterioration.  Critical care was time spent personally by me on the following activities: development of treatment plan with patient and/or surrogate as well as nursing, discussions with consultants, evaluation of patient's response to treatment, examination of patient, obtaining history from patient or surrogate, ordering and performing treatments and interventions, ordering and review of laboratory studies, ordering and review of radiographic studies, pulse oximetry and re-evaluation of patient's condition.   Length of Stay: 14  FINCH, [name] N, PA-C  05/28/2023, 11:48 AM  Advanced Heart Failure Team Pager [phone] (M-F; 7a - 5p)  Please contact [facility] for night-coverage after hours (4p -7a ) and weekends on [email or web address]  Patient seen with PA, I formulated the plan and agree with the above note.   Co-ox 78% on NE 5.  CVVH ongoing.  She is persistently acidotic.    Continues on Zosyn, off micafungin.   General: Sedated on vent.  Neck: No JVD, no thyromegaly or thyroid nodule.  Lungs: Crackles at bases.  CV: Nondisplaced PMI.  Heart regular S1/S2, no S3/S4, no murmur.  1+ ankle edema.  Abdomen: Soft, nontender, no hepatosplenomegaly, no distention.  Skin: Intact without lesions or rashes.  Neurologic: Sedated Extremities: No clubbing or cyanosis.  HEENT: Normal.   Sepsis physiology and primary RV failure.  We have not made progress towards extubation and she remains persistently acidotic.   Plan currently is transition to comfort care when family arrives.  Will need to deactivate ICD.   CRITICAL CARE Performed by: [name]  Total critical care time: 35 minutes  Critical care time was exclusive of separately billable procedures and treating other patients.  Critical care was necessary to treat or prevent imminent or life-threatening deterioration.  Critical care was time spent personally by me on the following activities: development of treatment plan with patient and/or surrogate as well as nursing, discussions with consultants, evaluation of patient's response to treatment, examination of patient, obtaining history from patient or surrogate, ordering and performing treatments and interventions, ordering and review of laboratory studies, ordering and review of radiographic studies, pulse oximetry and re-evaluation of patient's condition.  [name] 05/31/2023 12:37 PM

## 2023-06-22 DEATH — deceased

## 2023-07-14 ENCOUNTER — Ambulatory Visit: Payer: Medicare HMO

## 2023-10-13 ENCOUNTER — Ambulatory Visit: Payer: Medicare HMO

## 2023-12-23 ENCOUNTER — Ambulatory Visit: Payer: Medicare HMO | Admitting: Adult Health

## 2024-01-12 ENCOUNTER — Ambulatory Visit: Payer: Medicare HMO

## 2024-04-12 ENCOUNTER — Ambulatory Visit: Payer: Medicare HMO

## 2024-07-12 ENCOUNTER — Ambulatory Visit: Payer: Medicare HMO

## 2024-10-11 ENCOUNTER — Ambulatory Visit: Payer: Medicare HMO

## 2025-01-10 ENCOUNTER — Ambulatory Visit: Payer: Medicare HMO

## 2025-04-11 ENCOUNTER — Ambulatory Visit: Payer: Medicare HMO
